# Patient Record
Sex: Female | Born: 1965 | Race: Black or African American | Hispanic: No | Marital: Single | Smoking: Never smoker
Health system: Southern US, Community
[De-identification: ages and names within clinical notes are randomized; demographics above are authoritative.]

## PROBLEM LIST (undated history)

## (undated) DIAGNOSIS — Z789 Other specified health status: Secondary | ICD-10-CM

## (undated) DIAGNOSIS — Z803 Family history of malignant neoplasm of breast: Secondary | ICD-10-CM

## (undated) DIAGNOSIS — D649 Anemia, unspecified: Secondary | ICD-10-CM

## (undated) HISTORY — DX: Family history of malignant neoplasm of breast: Z80.3

## (undated) HISTORY — PX: TUBAL LIGATION: SHX77

---

## 2015-06-23 ENCOUNTER — Inpatient Hospital Stay
Admission: EM | Admit: 2015-06-23 | Discharge: 2015-06-30 | DRG: 871 | Disposition: A | Discharge status: Home / Self Care | Payer: Self-pay | Attending: Internal Medicine | Admitting: Internal Medicine

## 2015-06-23 ENCOUNTER — Emergency Department: Payer: Self-pay

## 2015-06-23 ENCOUNTER — Emergency Department: Payer: MEDICAID

## 2015-06-23 DIAGNOSIS — K56600 Partial intestinal obstruction, unspecified as to cause: Secondary | ICD-10-CM | POA: Diagnosis present

## 2015-06-23 DIAGNOSIS — R059 Cough, unspecified: Secondary | ICD-10-CM

## 2015-06-23 DIAGNOSIS — E876 Hypokalemia: Secondary | ICD-10-CM | POA: Diagnosis present

## 2015-06-23 DIAGNOSIS — A419 Sepsis, unspecified organism: Secondary | ICD-10-CM | POA: Diagnosis present

## 2015-06-23 DIAGNOSIS — D72829 Elevated white blood cell count, unspecified: Secondary | ICD-10-CM | POA: Diagnosis present

## 2015-06-23 DIAGNOSIS — A09 Infectious gastroenteritis and colitis, unspecified: Secondary | ICD-10-CM

## 2015-06-23 DIAGNOSIS — J154 Pneumonia due to other streptococci: Secondary | ICD-10-CM | POA: Diagnosis present

## 2015-06-23 DIAGNOSIS — D509 Iron deficiency anemia, unspecified: Secondary | ICD-10-CM | POA: Diagnosis present

## 2015-06-23 DIAGNOSIS — J189 Pneumonia, unspecified organism: Secondary | ICD-10-CM | POA: Diagnosis present

## 2015-06-23 DIAGNOSIS — R05 Cough: Secondary | ICD-10-CM

## 2015-06-23 DIAGNOSIS — J869 Pyothorax without fistula: Secondary | ICD-10-CM

## 2015-06-23 DIAGNOSIS — J90 Pleural effusion, not elsewhere classified: Secondary | ICD-10-CM | POA: Diagnosis present

## 2015-06-23 DIAGNOSIS — A400 Sepsis due to streptococcus, group A: Principal | ICD-10-CM | POA: Diagnosis present

## 2015-06-23 DIAGNOSIS — K56609 Unspecified intestinal obstruction, unspecified as to partial versus complete obstruction: Secondary | ICD-10-CM

## 2015-06-23 HISTORY — DX: Other specified health status: Z78.9

## 2015-06-23 HISTORY — DX: Anemia, unspecified: D64.9

## 2015-06-23 LAB — COMPREHENSIVE METABOLIC PANEL
ALBUMIN: 2.6 g/dL — AB (ref 3.5–5.0)
ALK PHOS: 88 U/L (ref 38–126)
ALT: 10 U/L — AB (ref 14–54)
ANION GAP: 11 (ref 5–15)
AST: 21 U/L (ref 15–41)
BILIRUBIN TOTAL: 0.8 mg/dL (ref 0.3–1.2)
BUN: 17 mg/dL (ref 6–20)
CALCIUM: 8.4 mg/dL — AB (ref 8.9–10.3)
CO2: 28 mmol/L (ref 22–32)
Chloride: 100 mmol/L — ABNORMAL LOW (ref 101–111)
Creatinine, Ser: 0.65 mg/dL (ref 0.44–1.00)
GFR calc Af Amer: >60 mL/min (ref >60)
GFR calc non Af Amer: >60 mL/min (ref >60)
GLUCOSE: 106 mg/dL — AB (ref 65–99)
Potassium: 2.7 mmol/L — CL (ref 3.5–5.1)
SODIUM: 139 mmol/L (ref 135–145)
TOTAL PROTEIN: 7.7 g/dL (ref 6.5–8.1)

## 2015-06-23 LAB — CBC WITH DIFFERENTIAL/PLATELET
BASOS ABS: 0.2 10*3/uL — AB (ref 0–0.1)
BASOS PCT: 1 %
EOS PCT: 0 %
Eosinophils Absolute: 0 10*3/uL (ref 0–0.7)
HEMATOCRIT: 27.6 % — AB (ref 35.0–47.0)
Hemoglobin: 8.3 g/dL — ABNORMAL LOW (ref 12.0–16.0)
Lymphocytes Relative: 2 %
Lymphs Abs: 0.4 10*3/uL — ABNORMAL LOW (ref 1.0–3.6)
MCH: 19.9 pg — ABNORMAL LOW (ref 26.0–34.0)
MCHC: 30.1 g/dL — AB (ref 32.0–36.0)
MCV: 66.3 fL — AB (ref 80.0–100.0)
MONO ABS: 0.8 10*3/uL (ref 0.2–0.9)
MONOS PCT: 4 %
NEUTROS ABS: 22.2 10*3/uL — AB (ref 1.4–6.5)
Neutrophils Relative %: 93 %
PLATELETS: 434 10*3/uL (ref 150–440)
RBC: 4.16 MIL/uL (ref 3.80–5.20)
RDW: 16.4 % — AB (ref 11.5–14.5)
WBC: 23.7 10*3/uL — ABNORMAL HIGH (ref 3.6–11.0)

## 2015-06-23 LAB — URINALYSIS COMPLETE WITH MICROSCOPIC (ARMC ONLY)
Bacteria, UA: NONE SEEN
Bilirubin Urine: NEGATIVE
Glucose, UA: NEGATIVE mg/dL
Hgb urine dipstick: NEGATIVE
Leukocytes, UA: NEGATIVE
Nitrite: NEGATIVE
PH: 6 (ref 5.0–8.0)
PROTEIN: 100 mg/dL — AB
Specific Gravity, Urine: 1.021 (ref 1.005–1.030)

## 2015-06-23 LAB — LACTIC ACID, PLASMA
Lactic Acid, Venous: 1.1 mmol/L (ref 0.5–2.0)
Lactic Acid, Venous: 1 mmol/L (ref 0.5–2.0)

## 2015-06-23 LAB — POCT PREGNANCY, URINE: PREG TEST UR: NEGATIVE

## 2015-06-23 IMAGING — CR DG ABDOMEN ACUTE W/ 1V CHEST
1 series · 4 of 4 positions shown · non-contrast
Comparison: None.

CLINICAL DATA: 49-year-old female with nausea and vomiting, fever,
chills, body aches and generalized abdominal pain. Last bowel
movement 2 days ago.

EXAM:
DG ABDOMEN ACUTE W/ 1V CHEST

[Series 1: dg abd acute w/chest · 0.14mm/px · 4 of 4 slices shown]
[im 1/4]
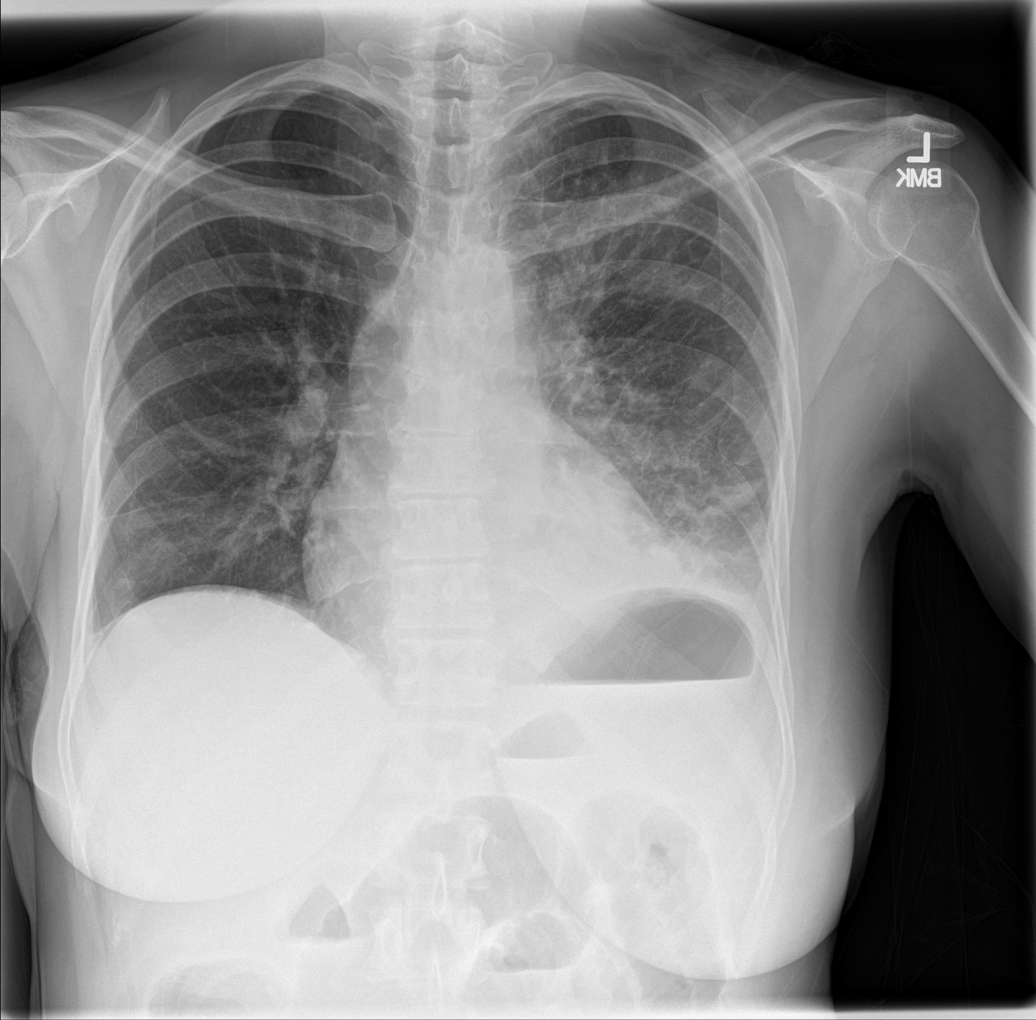
[im 2/4]
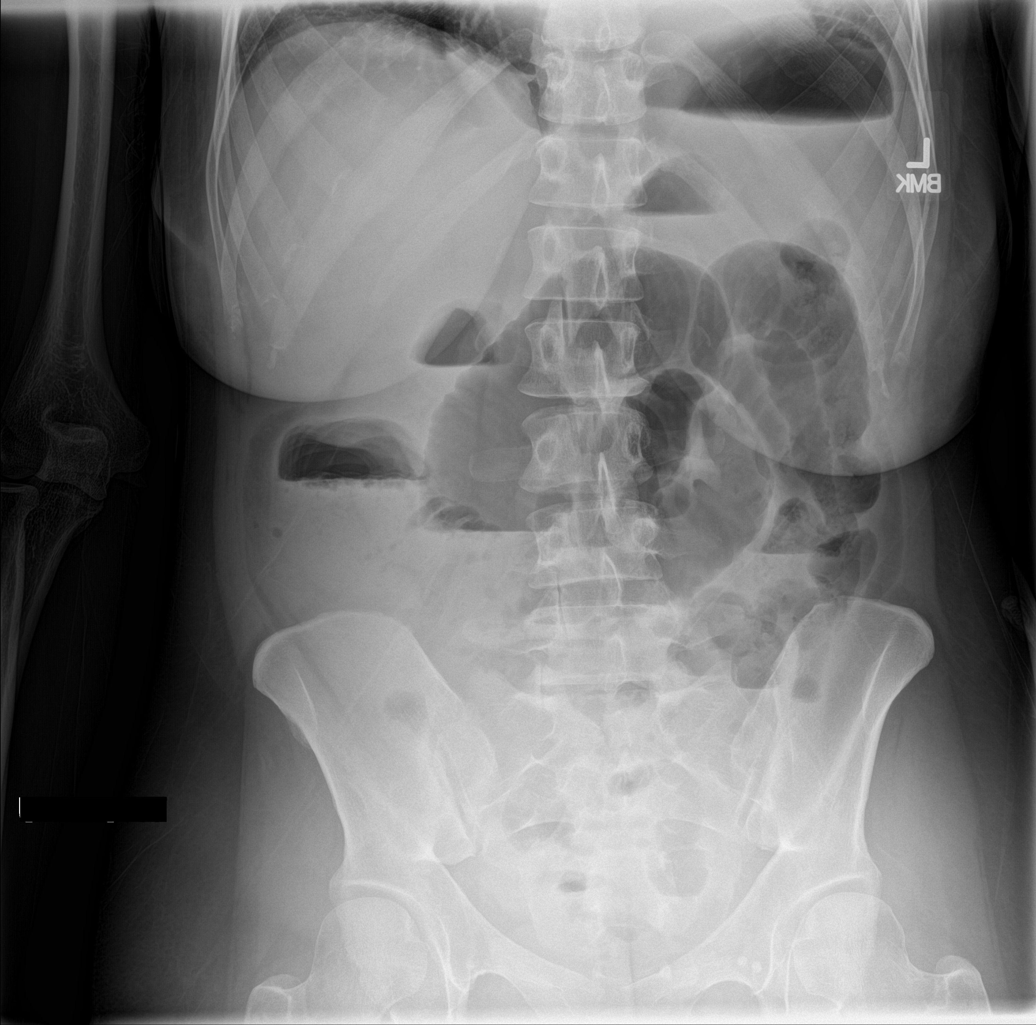
[im 3/4]
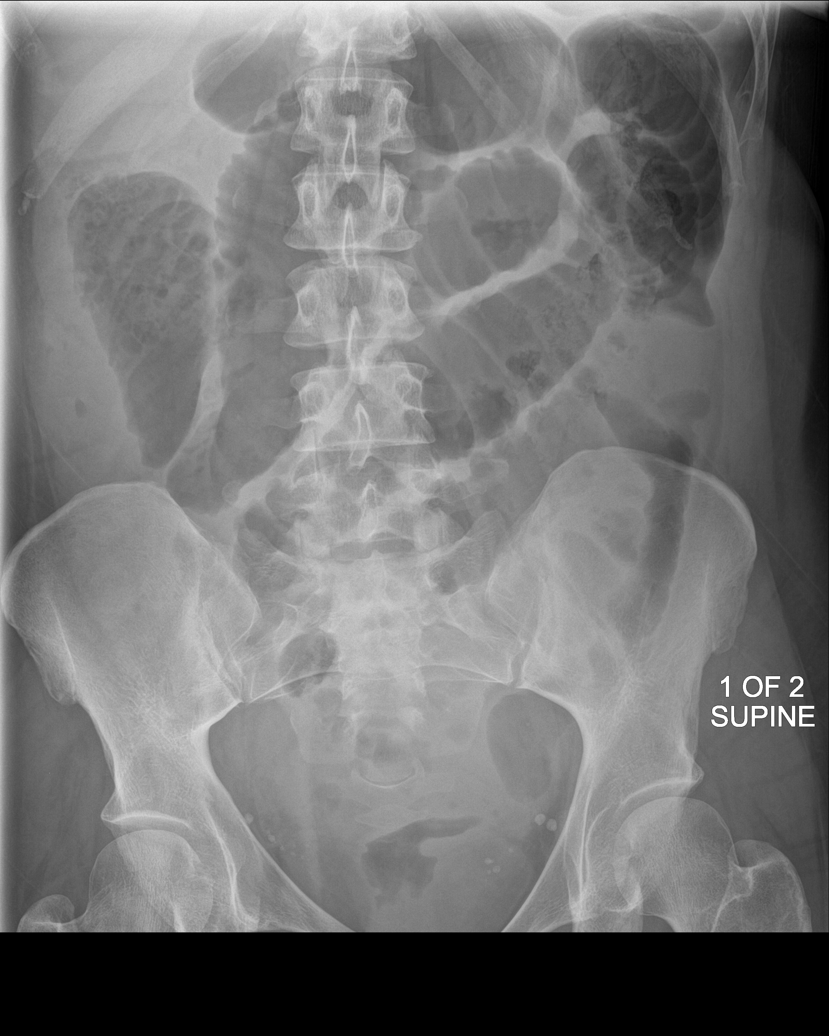
[im 4/4]
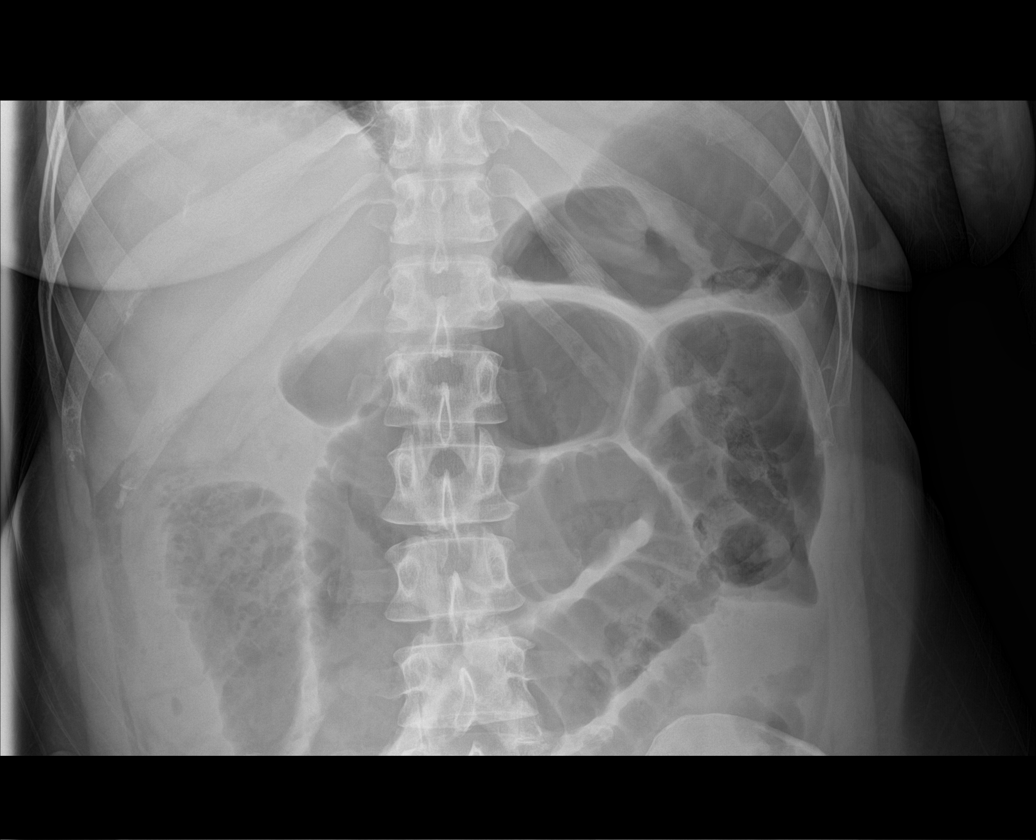

[4 of 4 positions shown; findings below may reference images not displayed]

FINDINGS: Multiple dilated loops of small bowel in the upper abdomen
demonstrating air- fluid levels on the erect image. Gas and liquid
stool in normal caliber colon from cecum to rectum. No evidence of
free intraperitoneal air on the erect image. Numerous pelvic
phleboliths. No visible opaque urinary tract calculi.

Cardiac silhouette upper normal in size. Hilar and mediastinal
contours unremarkable. Asymmetric interstitial opacities throughout
the left lung. Confluent airspace consolidation involving the left
lower lobe. Right lung clear. Small left pleural effusion.
IMPRESSION: 1. Partial small bowel obstruction. No evidence of free
intraperitoneal air.
2. Left lower lobe pneumonia. Asymmetric interstitial opacities
throughout the left lung. As the right lung is clear and the heart
size is upper normal, asymmetric pulmonary edema is not felt to be
the likely cause. Lymphangitic carcinomatosis or an interstitial
pneumonia can have this appearance. Follow-up chest x-ray is
recommended after treatment of the acute pneumonia.

## 2015-06-23 MED ORDER — DEXTROSE 5 % IV SOLN
1.0000 g | Freq: Once | INTRAVENOUS | Status: AC
  Administered 2015-06-23: 1 g via INTRAVENOUS
  Filled 2015-06-23: qty 10

## 2015-06-23 MED ORDER — IBUPROFEN 800 MG PO TABS
800.0000 mg | ORAL_TABLET | Freq: Once | ORAL | Status: AC
  Administered 2015-06-23: 800 mg via ORAL
  Filled 2015-06-23: qty 1

## 2015-06-23 MED ORDER — DEXTROSE 5 % IV SOLN
500.0000 mg | Freq: Once | INTRAVENOUS | Status: AC
  Administered 2015-06-23: 500 mg via INTRAVENOUS
  Filled 2015-06-23: qty 500

## 2015-06-23 MED ORDER — POTASSIUM CHLORIDE IN NACL 20-0.9 MEQ/L-% IV SOLN
Freq: Once | INTRAVENOUS | Status: AC
  Administered 2015-06-23: 21:00:00 via INTRAVENOUS
  Filled 2015-06-23: qty 1000

## 2015-06-23 MED ORDER — ONDANSETRON HCL 4 MG/2ML IJ SOLN
4.0000 mg | Freq: Four times a day (QID) | INTRAMUSCULAR | Status: DC | PRN
  Administered 2015-06-24 – 2015-06-25 (×3): 4 mg via INTRAVENOUS
  Filled 2015-06-23 (×3): qty 2

## 2015-06-23 MED ORDER — POTASSIUM CHLORIDE CRYS ER 20 MEQ PO TBCR
20.0000 meq | EXTENDED_RELEASE_TABLET | Freq: Once | ORAL | Status: DC
  Filled 2015-06-23: qty 1

## 2015-06-23 MED ORDER — POTASSIUM CHLORIDE 20 MEQ PO PACK
20.0000 meq | PACK | ORAL | Status: AC
  Administered 2015-06-23: 20 meq via ORAL
  Filled 2015-06-23: qty 1

## 2015-06-23 MED ORDER — ACETAMINOPHEN 650 MG RE SUPP: 650.0000 mg | Freq: Four times a day (QID) | RECTAL | Status: DC | PRN

## 2015-06-23 MED ORDER — SODIUM CHLORIDE 0.9 % IV SOLN
1.0000 g | Freq: Three times a day (TID) | INTRAVENOUS | Status: DC
  Administered 2015-06-24 (×2): 1 g via INTRAVENOUS
  Filled 2015-06-23 (×4): qty 1

## 2015-06-23 MED ORDER — SODIUM CHLORIDE 0.9 % IJ SOLN
3.0000 mL | Freq: Two times a day (BID) | INTRAMUSCULAR | Status: DC
  Administered 2015-06-24 – 2015-06-30 (×9): 3 mL via INTRAVENOUS

## 2015-06-23 MED ORDER — SODIUM CHLORIDE 0.9 % IV SOLN
INTRAVENOUS | Status: DC
  Administered 2015-06-23: via INTRAVENOUS

## 2015-06-23 MED ORDER — ONDANSETRON HCL 4 MG PO TABS: 4.0000 mg | ORAL_TABLET | Freq: Four times a day (QID) | ORAL | Status: DC | PRN

## 2015-06-23 MED ORDER — ACETAMINOPHEN 325 MG PO TABS
650.0000 mg | ORAL_TABLET | Freq: Four times a day (QID) | ORAL | Status: DC | PRN
  Filled 2015-06-23: qty 2

## 2015-06-23 MED ORDER — ENOXAPARIN SODIUM 40 MG/0.4ML ~~LOC~~ SOLN
40.0000 mg | SUBCUTANEOUS | Status: DC
  Administered 2015-06-24 – 2015-06-28 (×6): 40 mg via SUBCUTANEOUS
  Filled 2015-06-23 (×7): qty 0.4

## 2015-06-23 NOTE — Progress Notes (Signed)
ANTIBIOTIC CONSULT NOTE - INITIAL  Pharmacy Consult for meropenem Indication: rule out sepsis  No Known Allergies  Patient Measurements: Height: 5\' 9"  (175.3 cm) Weight: 177 lb 11.2 oz (80.604 kg) IBW/kg (Calculated) : 66.2 Adjusted Body Weight:   Vital Signs: Temp: 98.9 F (37.2 C) (11/10 2347) Temp Source: Oral (11/10 2347) BP: 118/70 mmHg (11/10 2347) Pulse Rate: 111 (11/10 2347) Intake/Output from previous day:   Intake/Output from this shift:    Labs:  Recent Labs  06/23/15 1822  WBC 23.7*  HGB 8.3*  PLT 434  CREATININE 0.65   Estimated Creatinine Clearance: 95.6 mL/min (by C-G formula based on Cr of 0.65). No results for input(s): VANCOTROUGH, VANCOPEAK, VANCORANDOM, GENTTROUGH, GENTPEAK, GENTRANDOM, TOBRATROUGH, TOBRAPEAK, TOBRARND, AMIKACINPEAK, AMIKACINTROU, AMIKACIN in the last 72 hours.   Microbiology: No results found for this or any previous visit (from the past 720 hour(s)).  Medical History: Past Medical History  Diagnosis Date  . Patient denies medical problems     Medications:  Infusions:  . sodium chloride     Assessment: 49 yof cc influenza, PNA on CXR in ED, elevated WBC, starting carbapenem for sepsis.   Goal of Therapy:    Plan:  Expected duration 7 days with resolution of temperature and/or normalization of WBC. Meropenem 1 gm IV Q8H.   Laural Benes, Pharm.D.  Clinical Pharmacist 06/23/2015,11:50 PM

## 2015-06-23 NOTE — ED Provider Notes (Addendum)
-----------------------------------------   8:49 PM on 06/23/2015 -----------------------------------------  Patient was initially seen by Wynema Birch. I have reviewed and added to his documentation. Please see the initial H&P for further detail.  The patient was moved from the flex care area to the acute care area due to her notable leukocytosis at 23,000 and her hypokalemia with a potassium of 2.7.  She reports she had diarrhea and abdominal discomfort on Monday. She did take some Imodium. She has not had diarrhea since Monday evening.  She did present to the emergency department this evening with a low-grade temperature of 100.18F and tachycardia with a heart rate in the 120s.  We are replacing her potassium with oral as well as IV potassium and will continue IV fluids to help with her heart rate.  Blood cultures obtained. I have advised the patient to attempt to have a bowel movement. If she provides a watery sample, we will send that for further testing as well as.  An abdominal x-ray series with chest x-ray shows dilated loops of bowel worrisome for partial small bowel obstruction.  The chest x-ray portion shows a left sided pneumonia.  I discussed the case with Dr. Jannifer Franklin who will see the patient for admission.  We will start the patient on ceftriaxone and azithromycin for her pneumonia.    Labs Reviewed  COMPREHENSIVE METABOLIC PANEL - Abnormal; Notable for the following:    Potassium 2.7 (*)    Chloride 100 (*)    Glucose, Bld 106 (*)    Calcium 8.4 (*)    Albumin 2.6 (*)    ALT 10 (*)    All other components within normal limits  CBC WITH DIFFERENTIAL/PLATELET - Abnormal; Notable for the following:    WBC 23.7 (*)    Hemoglobin 8.3 (*)    HCT 27.6 (*)    MCV 66.3 (*)    MCH 19.9 (*)    MCHC 30.1 (*)    RDW 16.4 (*)    Neutro Abs 22.2 (*)    Lymphs Abs 0.4 (*)    Basophils Absolute 0.2 (*)    All other components within normal limits  URINALYSIS COMPLETEWITH  MICROSCOPIC ([facility] ONLY) - Abnormal; Notable for the following:    Color, Urine YELLOW (*)    APPearance CLEAR (*)    Ketones, ur 1+ (*)    Protein, ur 100 (*)    Squamous Epithelial / LPF 0-5 (*)    All other components within normal limits  CULTURE, BLOOD (ROUTINE X 2)  CULTURE, BLOOD (ROUTINE X 2)  LACTIC ACID, PLASMA  LACTIC ACID, PLASMA  POC URINE PREG, ED  POCT PREGNANCY, URINE      Ahmed Prima, MD 06/23/15 2051  Ahmed Prima, MD 06/23/15 2104

## 2015-06-23 NOTE — H&P (Addendum)
[place] [facility] [place] NAME: Jessica Willis    MR#:  [id number]  DATE OF BIRTH:  [birth year]  DATE OF ADMISSION:  06/23/2015  PRIMARY CARE PHYSICIAN: No PCP Per Patient   REQUESTING/REFERRING PHYSICIAN: Thomasene Lot, MD  CHIEF COMPLAINT:   Chief Complaint  Patient presents with  . Influenza    HISTORY OF PRESENT ILLNESS:  Jessica Willis  is a 49 y.o. female who presents with fever, chills, malaise.  Patient states that she has had some GI upset with initially diarrhea 3-4 days ago which stopped about 2 days ago. She's had malaise and fever with chills. In general she just has felt weak and with significant lack of energy. She has had some left-sided pleuritic pain, which she attributed to the fact that she was sleeping on her left side a lot. She denies any cough, nausea, vomiting. Denies any baseline medical problems. Does not take any medications daily. In the ED she was found to have what is likely a pneumonia on chest x-ray, with an elevated white blood cell count. Also an abdominal x-ray shows found to have a likely partial small bowel obstruction. Hospitalists were called for admission for the same  PAST MEDICAL HISTORY:   Past Medical History  Diagnosis Date  . Patient denies medical problems     PAST SURGICAL HISTORY:   Past Surgical History  Procedure Laterality Date  . Tubal ligation      SOCIAL HISTORY:   Social History  Substance Use Topics  . Smoking status: Never Smoker   . Smokeless tobacco: Not on file  . Alcohol Use: 0.0 oz/week    0 Standard drinks or equivalent per week     Comment: occassional    FAMILY HISTORY:   Family History  Problem Relation Age of Onset  . Diabetes    . Hypertension      DRUG ALLERGIES:  No Known Allergies  MEDICATIONS AT HOME:   Prior to Admission medications   Not on File    REVIEW OF SYSTEMS:  Review of Systems  Constitutional: Positive for fever,  chills and malaise/fatigue. Negative for weight loss.  HENT: Negative for ear pain, hearing loss and tinnitus.   Eyes: Negative for blurred vision, double vision, pain and redness.  Respiratory: Negative for cough, hemoptysis and shortness of breath.   Cardiovascular: Positive for chest pain (left-sided, pleuritic). Negative for palpitations, orthopnea and leg swelling.  Gastrointestinal: Positive for diarrhea. Negative for nausea, vomiting, abdominal pain and constipation.  Genitourinary: Negative for dysuria, frequency and hematuria.  Musculoskeletal: Negative for back pain, joint pain and neck pain.  Skin:       No acne, rash, or lesions  Neurological: Negative for dizziness, tremors, focal weakness and weakness.  Endo/Heme/Allergies: Negative for polydipsia. Does not bruise/bleed easily.  Psychiatric/Behavioral: Negative for depression. The patient is not nervous/anxious and does not have insomnia.      VITAL SIGNS:   Filed Vitals:   06/23/15 1712 06/23/15 1821 06/23/15 2040  BP: 155/86 146/79 128/81  Pulse: 124 123 127  Temp: 100.4 F (38 C) 100.1 F (37.8 C)   Resp: 16 16   Height: 5\' 9"  (1.753 m)    SpO2: 94% 94% 94%   Wt Readings from Last 3 Encounters:  No data found for Wt    PHYSICAL EXAMINATION:  Physical Exam  Vitals reviewed. Constitutional: She is oriented to person, place, and time. She appears well-developed and well-nourished. No distress (she does appear  uncomfortable).  HENT:  Head: Normocephalic and atraumatic.  Mouth/Throat: Oropharynx is clear and moist.  Eyes: Conjunctivae and EOM are normal. Pupils are equal, round, and reactive to light. No scleral icterus.  Neck: Normal range of motion. Neck supple. No JVD present. No thyromegaly present.  Cardiovascular: Regular rhythm and intact distal pulses.  Exam reveals no gallop and no friction rub.   No murmur heard. Tachycardic  Respiratory: Effort normal. No respiratory distress. She has no wheezes. She  has rales (left lower lobe).  GI: Soft. Bowel sounds are normal. She exhibits no distension. There is no tenderness.  Musculoskeletal: Normal range of motion. She exhibits no edema.  No arthritis, no gout  Lymphadenopathy:    She has no cervical adenopathy.  Neurological: She is alert and oriented to person, place, and time. No cranial nerve deficit.  No dysarthria, no aphasia  Skin: Skin is warm and dry. No rash noted. No erythema.  Psychiatric: She has a normal mood and affect. Her behavior is normal. Judgment and thought content normal.    LABORATORY PANEL:   CBC  Recent Labs Lab 06/23/15 1822  WBC 23.7*  HGB 8.3*  HCT 27.6*  PLT 434   ------------------------------------------------------------------------------------------------------------------  Chemistries   Recent Labs Lab 06/23/15 1822  NA 139  K 2.7*  CL 100*  CO2 28  GLUCOSE 106*  BUN 17  CREATININE 0.65  CALCIUM 8.4*  AST 21  ALT 10*  ALKPHOS 88  BILITOT 0.8   ------------------------------------------------------------------------------------------------------------------  Cardiac Enzymes No results for input(s): TROPONINI in the last 168 hours. ------------------------------------------------------------------------------------------------------------------  RADIOLOGY:  Dg Abd Acute W/chest  06/23/2015  CLINICAL DATA:  49 year old female with nausea and vomiting, fever, chills, body aches and generalized abdominal pain. Last bowel movement 2 days ago. EXAM: DG ABDOMEN ACUTE W/ 1V CHEST COMPARISON:  None. FINDINGS: Multiple dilated loops of small bowel in the upper abdomen demonstrating air- fluid levels on the erect image. Gas and liquid stool in normal caliber colon from cecum to rectum. No evidence of free intraperitoneal air on the erect image. Numerous pelvic phleboliths. No visible opaque urinary tract calculi. Cardiac silhouette upper normal in size. Hilar and mediastinal contours  unremarkable. Asymmetric interstitial opacities throughout the left lung. Confluent airspace consolidation involving the left lower lobe. Right lung clear. Small left pleural effusion. IMPRESSION: 1. Partial small bowel obstruction. No evidence of free intraperitoneal air. 2. Left lower lobe pneumonia. Asymmetric interstitial opacities throughout the left lung. As the right lung is clear and the heart size is upper normal, asymmetric pulmonary edema is not felt to be the likely cause. Lymphangitic carcinomatosis or an interstitial pneumonia can have this appearance. Follow-up chest x-ray is recommended after treatment of the acute pneumonia. Electronically Signed   By: Evangeline Dakin M.D.   On: 06/23/2015 20:39    EKG:  No orders found for this or any previous visit.  IMPRESSION AND PLAN:  Principal Problem:   Sepsis ([place]) - tachycardia, tachypnea, leukocytosis. IV antibiotics started in the ED, cultures sent. Lactic acid normal. Hemodynamically stable. Sepsis likely due to community-acquired pneumonia, see below for treatment. IV fluids given. Active Problems:   CAP (community acquired pneumonia) - left lower lobe pneumonia seen on chest x-ray. Rocephin and azithromycin given in the ED. Given her abdominal complaints we will switch this to meropenem on admission.   Partial small bowel obstruction ([place]) - patient denies current complaint of abdominal issues other than some intermittent abdominal discomfort. States that her diarrhea has stopped at this  time. Abdominal x-ray shows significantly dilated loops of small bowel concerning for partial small bowel obstruction. Will keep on clear liquids only at this time, and monitor for improvement.   Hypokalemia - replace and monitor  All the records are reviewed and case discussed with ED provider. Management plans discussed with the patient and/or family.  DVT PROPHYLAXIS: SubQ lovenox  ADMISSION STATUS: Inpatient  CODE STATUS: full  TOTAL TIME  TAKING CARE OF THIS PATIENT: 45 minutes.    Livie Vanderhoof FIELDING 06/23/2015, 10:08 PM  [facility]  Office  [phone]  CC: Primary care physician; No PCP Per Patient

## 2015-06-23 NOTE — ED Notes (Signed)
Pt C/O flu-like symptoms, No N/V. Reports cold chills, dry mouth, abd pain.

## 2015-06-23 NOTE — ED Provider Notes (Addendum)
[facility] Emergency Department Provider Note  ____________________________________________  Time seen: Approximately 5:59 PM  I have reviewed the triage vital signs and the nursing notes.   HISTORY  Chief Complaint Influenza  "stomach flu", diarrhea   HPI Jessica Willis is a 49 y.o. female who presents to evaluation of "flulike symptoms". Earlier in week complaint of nausea and vomiting, however that resolved with the use of Imodium. Now complains of fever, chills, body aches, lethargy, dry mouth and abdominal pain. Reports last bowel movement was 2 days ago.   History reviewed. No pertinent past medical history.  There are no active problems to display for this patient.   Past Surgical History  Procedure Laterality Date  . Tubal ligation      No current outpatient prescriptions on file.  Allergies Review of patient's allergies indicates no known allergies.  No family history on file.  Social History Social History  Substance Use Topics  . Smoking status: Never Smoker   . Smokeless tobacco: None  . Alcohol Use: Yes    Review of Systems Constitutional: Positive fever and chills. Eyes: No visual changes. ENT: No sore throat. Complains of having a very dry mouth. Cardiovascular: Denies chest pain. Respiratory: Denies shortness of breath. Gastrointestinal: Positive abdominal pain all quadrants.  No nausea, no vomiting.  No diarrhea.  No constipation. Last BM 2 days ago. Genitourinary: Negative for dysuria. Musculoskeletal: Negative for back pain. Skin: Negative for rash. Neurological: Negative for headaches, positive for weakness, denies any numbness.  10-point ROS otherwise negative.  ____________________________________________   PHYSICAL EXAM:  VITAL SIGNS: ED Triage Vitals  Enc Vitals Group     BP 06/23/15 1712 155/86 mmHg     Pulse Rate 06/23/15 1712 124     Resp 06/23/15 1712 16     Temp 06/23/15 1712 100.4 F (38  C)     Temp src --      SpO2 06/23/15 1712 94 %     Weight --      Height 06/23/15 1712 5\' 9"  (1.753 m)     Head Cir --      Peak Flow --      Pain Score --      Pain Loc --      Pain Edu? --      Excl. in [place]? --     Constitutional: Alert and oriented. Well appearing and in no acute distress. Eyes: Conjunctivae are normal. PERRL. EOMI. Head: Atraumatic. Nose: No congestion/rhinnorhea. Mouth/Throat: Mucous membranes are moist.  Oropharynx non-erythematous. Neck: No stridor.   Cardiovascular: Normal rate, regular rhythm. Grossly normal heart sounds.  Good peripheral circulation. Respiratory: Normal respiratory effort.  No retractions. Lungs CTAB. Gastrointestinal: Soft, positive distention. Positive tympanic sounds noted in all 4 quadrants. Musculoskeletal: No lower extremity tenderness nor edema.  No joint effusions. Neurologic:  Normal speech and language. No gross focal neurologic deficits are appreciated. No gait instability. Skin:  Skin is warm, dry and intact. No rash noted. Psychiatric: Mood and affect are normal. Speech and behavior are normal.  ____________________________________________   LABS (all labs ordered are listed, but only abnormal results are displayed)  Labs Reviewed  COMPREHENSIVE METABOLIC PANEL - Abnormal; Notable for the following:    Potassium 2.7 (*)    Chloride 100 (*)    Glucose, Bld 106 (*)    Calcium 8.4 (*)    Albumin 2.6 (*)    ALT 10 (*)    All other components within normal limits  CBC  WITH DIFFERENTIAL/PLATELET - Abnormal; Notable for the following:    WBC 23.7 (*)    Hemoglobin 8.3 (*)    HCT 27.6 (*)    MCV 66.3 (*)    MCH 19.9 (*)    MCHC 30.1 (*)    RDW 16.4 (*)    Neutro Abs 22.2 (*)    Lymphs Abs 0.4 (*)    Basophils Absolute 0.2 (*)    All other components within normal limits  URINALYSIS COMPLETEWITH MICROSCOPIC ([facility] ONLY) - Abnormal; Notable for the following:    Color, Urine YELLOW (*)    APPearance CLEAR (*)     Ketones, ur 1+ (*)    Protein, ur 100 (*)    Squamous Epithelial / LPF 0-5 (*)    All other components within normal limits  CULTURE, BLOOD (ROUTINE X 2)  CULTURE, BLOOD (ROUTINE X 2)  LACTIC ACID, PLASMA  LACTIC ACID, PLASMA  POC URINE PREG, ED  POCT PREGNANCY, URINE   ____________________________________________  RADIOLOGY  Not available to the time of this dictation. ____________________________________________   PROCEDURES  Procedure(s) performed: None  Critical Care performed: No  ____________________________________________   INITIAL IMPRESSION / ASSESSMENT AND PLAN / ED COURSE  Pertinent labs & imaging results that were available during my care of the patient were reviewed by me and considered in my medical decision making (see chart for details).  Acute hypokalemia with elevated WBC. Saline lock started lactic acid and blood cultures 2 performed. Patient to be transferred over to the main ER, report given to Dr. Thomasene Lot. ____________________________________________   FINAL CLINICAL IMPRESSION(S) / ED DIAGNOSES  Final diagnoses:  Hypokalemia due to loss of potassium  Leukocytosis  Diarrhea of infectious origin  Community acquired pneumonia      Arlyss Repress, PA-C 06/23/15 1903  Ahmed Prima, MD 06/23/15 2049  Ahmed Prima, MD 06/23/15 2105

## 2015-06-24 ENCOUNTER — Inpatient Hospital Stay: Payer: Self-pay

## 2015-06-24 ENCOUNTER — Encounter: Payer: Self-pay | Admitting: Emergency Medicine

## 2015-06-24 DIAGNOSIS — E876 Hypokalemia: Secondary | ICD-10-CM | POA: Diagnosis present

## 2015-06-24 LAB — BASIC METABOLIC PANEL
Anion gap: 9 (ref 5–15)
BUN: 16 mg/dL (ref 6–20)
CALCIUM: 7.8 mg/dL — AB (ref 8.9–10.3)
CHLORIDE: 100 mmol/L — AB (ref 101–111)
CO2: 30 mmol/L (ref 22–32)
CREATININE: 0.55 mg/dL (ref 0.44–1.00)
GFR calc non Af Amer: >60 mL/min (ref >60)
GLUCOSE: 118 mg/dL — AB (ref 65–99)
Potassium: 2.5 mmol/L — CL (ref 3.5–5.1)
Sodium: 139 mmol/L (ref 135–145)

## 2015-06-24 LAB — IRON AND TIBC
Iron: <5 ug/dL — ABNORMAL LOW (ref 28–170)
TIBC: 225 ug/dL — ABNORMAL LOW (ref 250–450)

## 2015-06-24 LAB — MAGNESIUM
MAGNESIUM: 2.5 mg/dL — AB (ref 1.7–2.4)
Magnesium: 2.4 mg/dL (ref 1.7–2.4)

## 2015-06-24 LAB — CBC
HCT: 24 % — ABNORMAL LOW (ref 35.0–47.0)
Hemoglobin: 7.5 g/dL — ABNORMAL LOW (ref 12.0–16.0)
MCH: 20.7 pg — AB (ref 26.0–34.0)
MCHC: 31.2 g/dL — AB (ref 32.0–36.0)
MCV: 66.3 fL — AB (ref 80.0–100.0)
PLATELETS: 353 10*3/uL (ref 150–440)
RBC: 3.63 MIL/uL — AB (ref 3.80–5.20)
RDW: 16.2 % — AB (ref 11.5–14.5)
WBC: 15.5 10*3/uL — ABNORMAL HIGH (ref 3.6–11.0)

## 2015-06-24 LAB — FERRITIN: Ferritin: 188 ng/mL (ref 11–307)

## 2015-06-24 LAB — POTASSIUM: POTASSIUM: 3.1 mmol/L — AB (ref 3.5–5.1)

## 2015-06-24 IMAGING — CR DG ABDOMEN 2V
2 series · 2 of 2 positions shown · non-contrast
Comparison: [DATE]

CLINICAL DATA: Followup bowel obstruction. Lower abdominal pain,
nausea.

EXAM:
ABDOMEN - 2 VIEW

[abdomen erect]
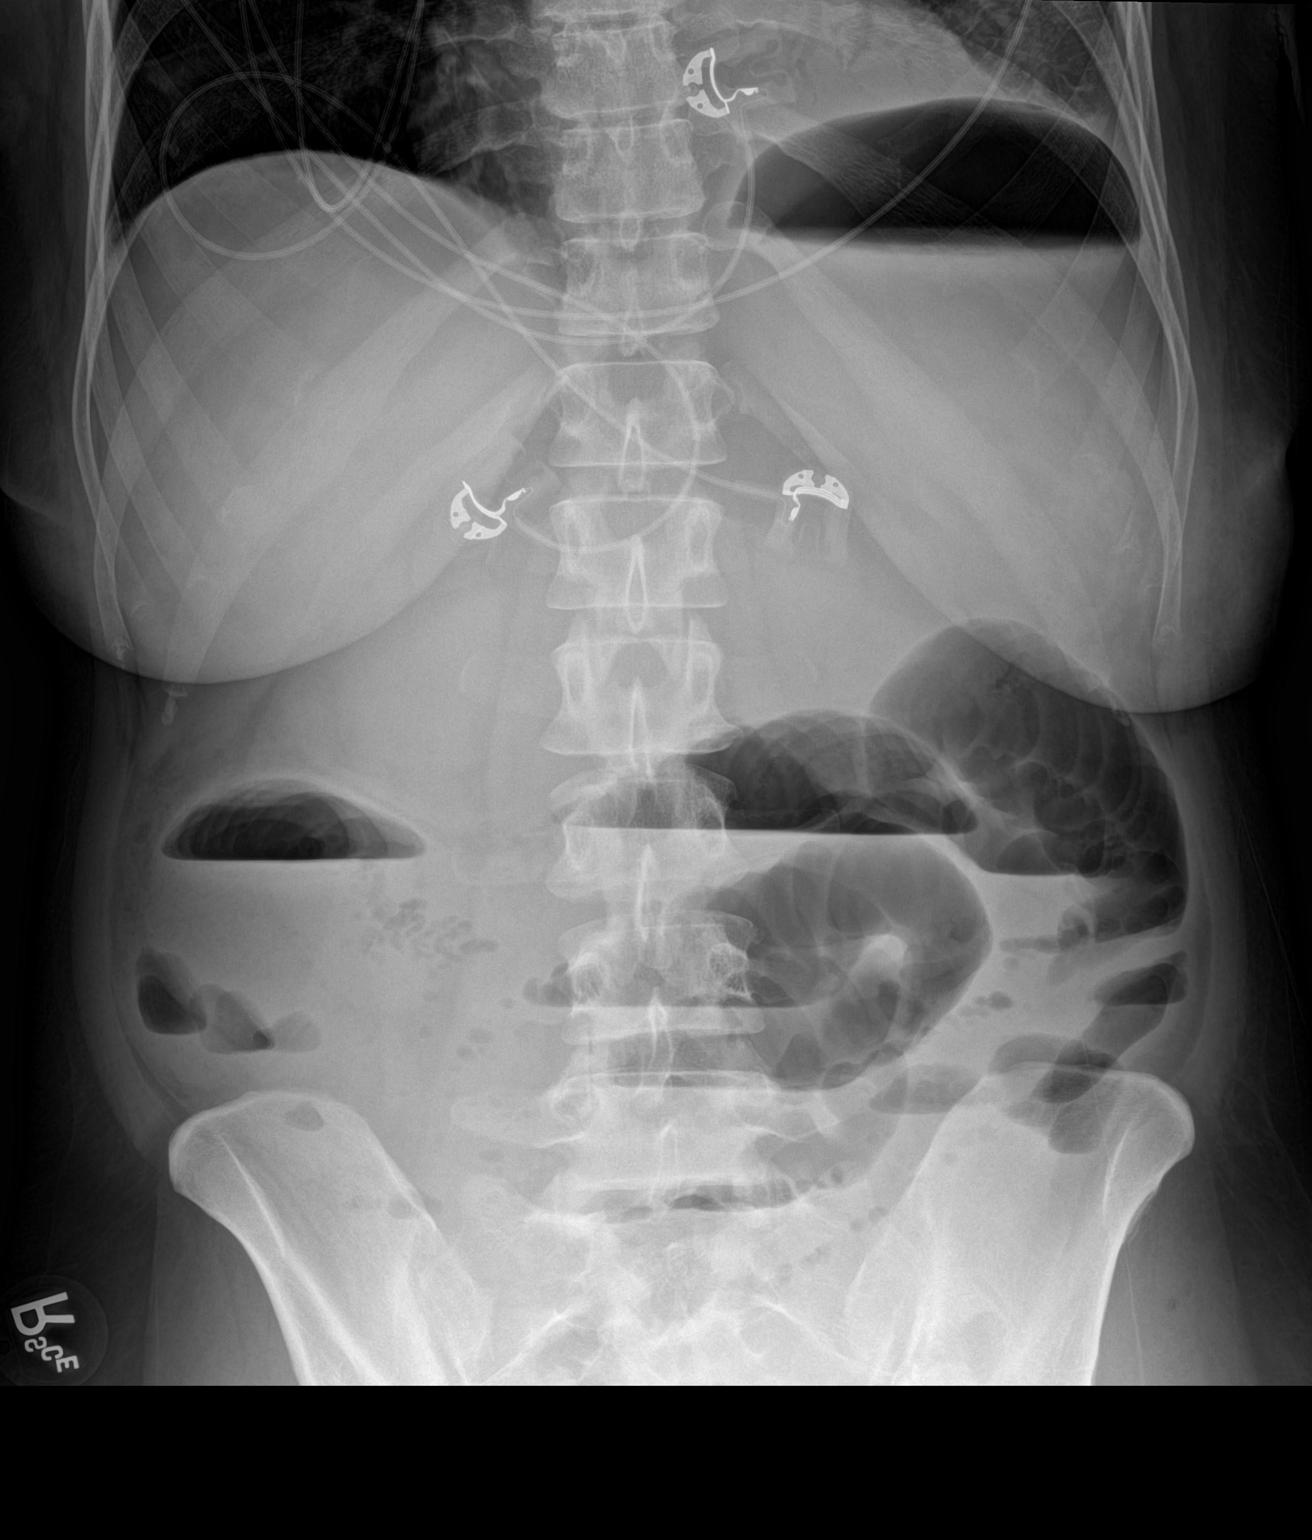

[abdomen supine]
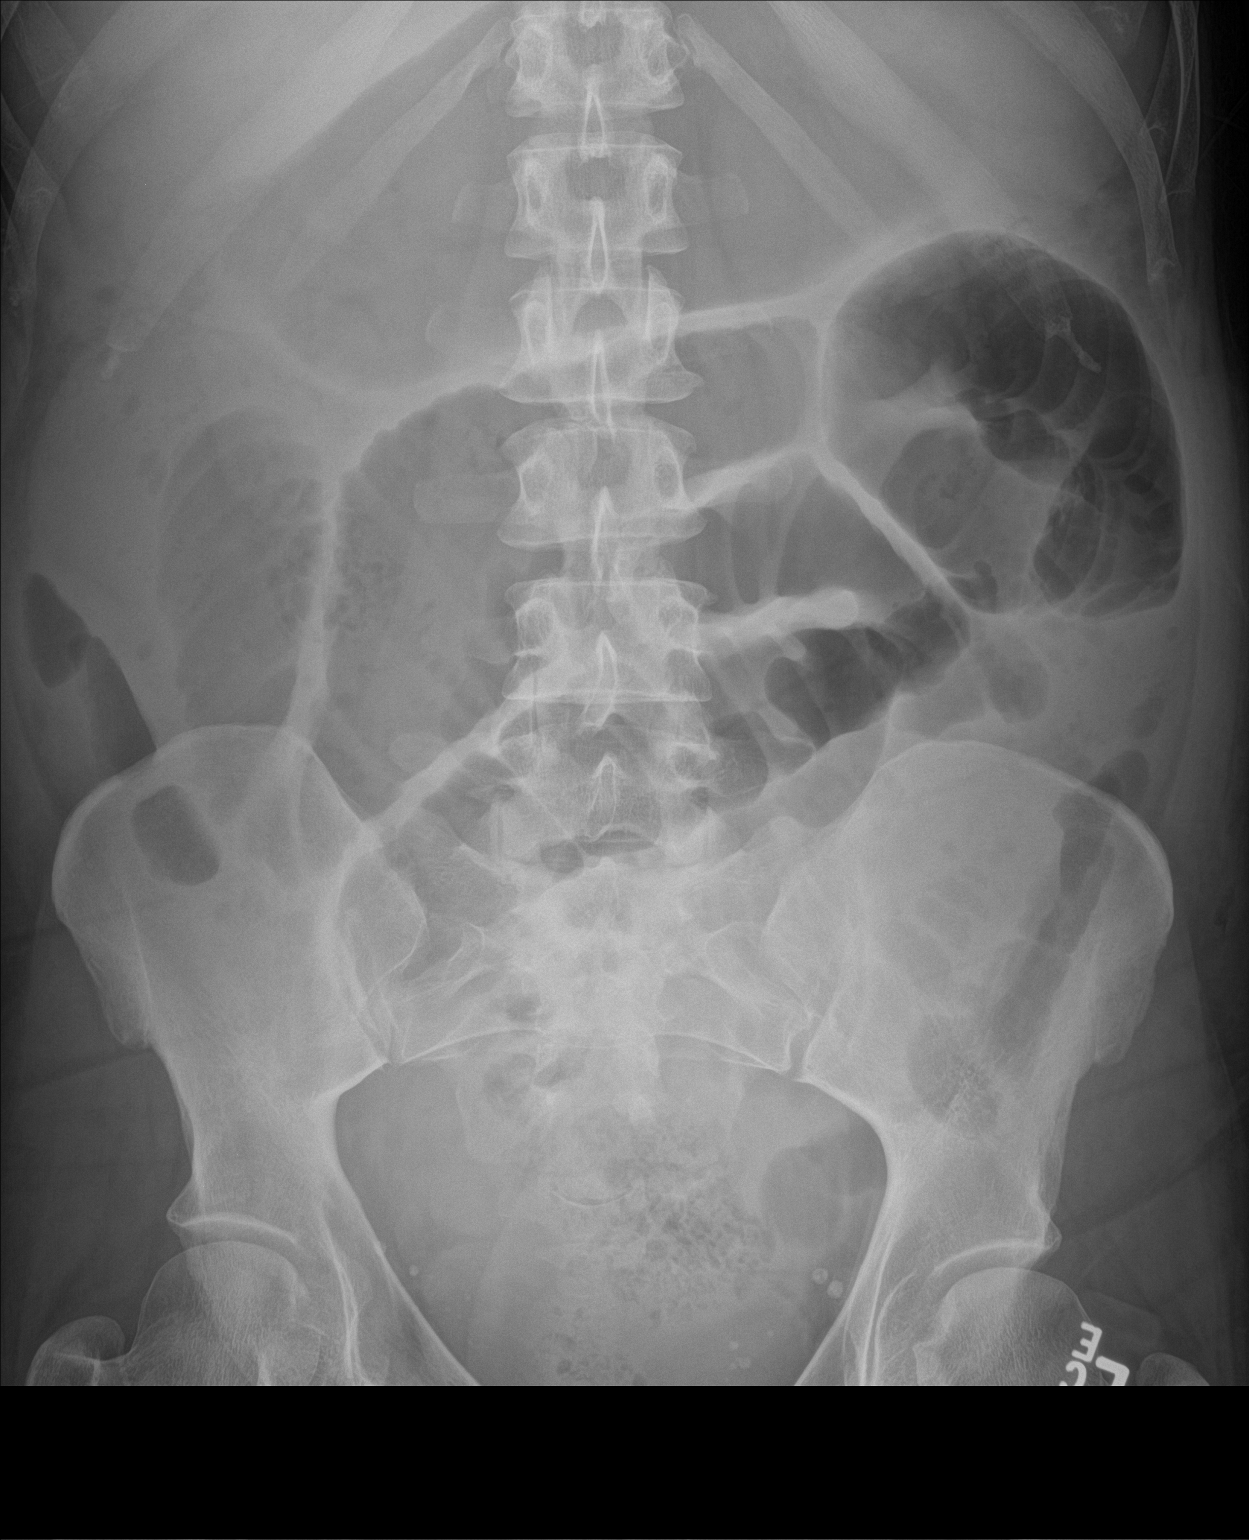

[2 of 2 positions shown; findings below may reference images not displayed]

FINDINGS: Dilated small bowel loops with air-fluid levels are again noted,
similar prior study, compatible with small bowel obstruction. Mild
gaseous distention of the stomach with air-fluid level. No free air
organomegaly.
IMPRESSION: Continued small bowel obstruction pattern without significant
change.

## 2015-06-24 MED ORDER — POTASSIUM CHLORIDE 10 MEQ/100ML IV SOLN
10.0000 meq | INTRAVENOUS | Status: AC
  Administered 2015-06-24 (×2): 10 meq via INTRAVENOUS
  Filled 2015-06-24 (×2): qty 100

## 2015-06-24 MED ORDER — POTASSIUM CHLORIDE CRYS ER 20 MEQ PO TBCR
40.0000 meq | EXTENDED_RELEASE_TABLET | Freq: Once | ORAL | Status: AC
  Administered 2015-06-24: 40 meq via ORAL
  Filled 2015-06-24: qty 2

## 2015-06-24 MED ORDER — DEXTROSE 5 % IV SOLN
1.0000 g | INTRAVENOUS | Status: DC
  Administered 2015-06-24 – 2015-06-26 (×3): 1 g via INTRAVENOUS
  Filled 2015-06-24 (×4): qty 10

## 2015-06-24 MED ORDER — AZITHROMYCIN 500 MG IV SOLR
500.0000 mg | INTRAVENOUS | Status: DC
  Administered 2015-06-24 – 2015-06-25 (×2): 500 mg via INTRAVENOUS
  Filled 2015-06-24 (×3): qty 500

## 2015-06-24 MED ORDER — POTASSIUM CHLORIDE IN NACL 20-0.9 MEQ/L-% IV SOLN
INTRAVENOUS | Status: DC
  Administered 2015-06-24 – 2015-06-25 (×2): via INTRAVENOUS
  Filled 2015-06-24 (×4): qty 1000

## 2015-06-24 MED ORDER — PANTOPRAZOLE SODIUM 40 MG IV SOLR
40.0000 mg | Freq: Two times a day (BID) | INTRAVENOUS | Status: DC
  Administered 2015-06-24 – 2015-06-28 (×9): 40 mg via INTRAVENOUS
  Filled 2015-06-24 (×9): qty 40

## 2015-06-24 NOTE — Progress Notes (Signed)
Initial Nutrition Assessment   INTERVENTION:   Coordination of Care: await diet progression as medically able Medical Food Supplement Therapy: will recommend Boost Breeze po TID, each supplement provides 250 kcal and 9 grams of protein Coordination of Care: will continue to assess for malnutrition using guidelines from AND/ASPEN    NUTRITION DIAGNOSIS:   Inadequate oral intake related to acute illness as evidenced by per patient/family report.  GOAL:   Patient will meet greater than or equal to 90% of their needs  MONITOR:    (Energy Intake, Electrolyte and renal Profile, Digestive system, Anthropometrics)  REASON FOR ASSESSMENT:   Malnutrition Screening Tool    ASSESSMENT:   Pt admitted with pna with sepsis, possible aspiration pna secondary to vomitting. Per MD note, pt with n/v for one day prior to Monday of this week where upon pt has not had flatus or BM since.   Past Medical History  Diagnosis Date  . Patient denies medical problems   . Anemia     Diet Order:  Diet NPO time specified    Current Nutrition: Pt now NPO, was on CL diet order this am. Pt reports having some broth unable to clarify this morning or last night.   Food/Nutrition-Related History: Pt reports appetite has been down for the past week. Unable to clarify further as pt nauseated and not wanting to elaborate. RD notes pt with n/v last weekend prior to Monday.    Scheduled Medications:  . azithromycin (ZITHROMAX) 500 MG IVPB  500 mg Intravenous Q24H  . cefTRIAXone (ROCEPHIN)  IV  1 g Intravenous Q24H  . enoxaparin (LOVENOX) injection  40 mg Subcutaneous Q24H  . sodium chloride  3 mL Intravenous Q12H    Continuous Medications:  . 0.9 % NaCl with KCl 20 mEq / L 75 mL/hr at 06/24/15 1032     Electrolyte/Renal Profile and Glucose Profile:   Recent Labs Lab 06/23/15 1822 06/24/15 0411  NA 139 139  K 2.7* 2.5*  CL 100* 100*  CO2 28 30  BUN 17 16  CREATININE 0.65 0.55  CALCIUM 8.4*  7.8*  MG 2.4  --   GLUCOSE 106* 118*   Protein Profile:  Recent Labs Lab 06/23/15 1822  ALBUMIN 2.6*    Gastrointestinal Profile: +nausea Last BM:  06/20/2015   Nutrition-Focused Physical Exam Findings:  Unable to complete Nutrition-Focused physical exam at this time.    Weight Change: Pt reports weight of 186lbs UBW up until Sunday. 5% weight loss in one week.    Skin:  Reviewed, no issues  Height:   Ht Readings from Last 1 Encounters:  06/23/15 5\' 9"  (1.753 m)    Weight:   Wt Readings from Last 1 Encounters:  06/23/15 177 lb 11.2 oz (80.604 kg)     BMI:  Body mass index is 26.23 kg/(m^2).  Estimated Nutritional Needs:   Kcal:  BEE: 1490kcals, TEE: (IF 1.1-1.3)(AF 1.2) [id number]  Protein:  65-80g protein (0.8-1.0g/kg)  Fluid:  2015-246mL of fluid (25-36mL/kg)    HIGH Care Level  Dwyane Luo, RD, LDN Pager [phone]

## 2015-06-24 NOTE — Progress Notes (Signed)
[facility] NAME: Bhavani Chiaramonte    MR#:  [id number]  DATE OF BIRTH:  24-Oct-1965  SUBJECTIVE:  CHIEF COMPLAINT:   Chief Complaint  Patient presents with  . Influenza   Admitted for abdominal pain and nausea. Found to have left lower lobe pneumonia along with sepsis. Today her main concern is nausea. Last bowel movement was Monday. Had vomiting and diarrhea prior to this for one day. No significant shortness of breath. Mild cough. Afebrile. REVIEW OF SYSTEMS:    Review of Systems  Constitutional: Negative for fever and chills.  HENT: Negative for sore throat.   Eyes: Negative for blurred vision, double vision and pain.  Respiratory: Negative for cough, hemoptysis, shortness of breath and wheezing.   Cardiovascular: Negative for chest pain, palpitations, orthopnea and leg swelling.  Gastrointestinal: Negative for heartburn, nausea, vomiting, abdominal pain, diarrhea and constipation.  Genitourinary: Negative for dysuria and hematuria.  Musculoskeletal: Negative for back pain and joint pain.  Skin: Negative for rash.  Neurological: Negative for sensory change, speech change, focal weakness and headaches.  Endo/Heme/Allergies: Does not bruise/bleed easily.  Psychiatric/Behavioral: Negative for depression. The patient is not nervous/anxious.    DRUG ALLERGIES:  No Known Allergies  VITALS:  Blood pressure 130/81, pulse 112, temperature 98 F (36.7 C), temperature source Oral, resp. rate 17, height 5\' 9"  (1.753 m), weight 80.604 kg (177 lb 11.2 oz), SpO2 94 %.  PHYSICAL EXAMINATION:   Physical Exam  GENERAL:  49 y.o.-year-old patient lying in the bed with no acute distress.  EYES: Pupils equal, round, reactive to light and accommodation. No scleral icterus. Extraocular muscles intact.  HEENT: Head atraumatic, normocephalic. Oropharynx and nasopharynx clear.  NECK:  Supple, no jugular venous distention. No thyroid  enlargement, no tenderness.  LUNGS: Normal breath sounds bilaterally, no wheezing, rales, rhonchi. No use of accessory muscles of respiration. His breath sounds left lower lobe CARDIOVASCULAR: S1, S2 normal. No murmurs, rubs, or gallops.  ABDOMEN: Soft, nondistended. Bowel sounds present. No organomegaly or mass. Diffuse tenderness EXTREMITIES: No cyanosis, clubbing or edema b/l.    NEUROLOGIC: Cranial nerves II through XII are intact. No focal Motor or sensory deficits b/l.   PSYCHIATRIC: The patient is alert and oriented x 3.  SKIN: No obvious rash, lesion, or ulcer.   LABORATORY PANEL:   CBC  Recent Labs Lab 06/24/15 0411  WBC 15.5*  HGB 7.5*  HCT 24.0*  PLT 353   ------------------------------------------------------------------------------------------------------------------  Chemistries   Recent Labs Lab 06/23/15 1822 06/24/15 0411  NA 139 139  K 2.7* 2.5*  CL 100* 100*  CO2 28 30  GLUCOSE 106* 118*  BUN 17 16  CREATININE 0.65 0.55  CALCIUM 8.4* 7.8*  MG 2.4  --   AST 21  --   ALT 10*  --   ALKPHOS 88  --   BILITOT 0.8  --    ------------------------------------------------------------------------------------------------------------------  Cardiac Enzymes No results for input(s): TROPONINI in the last 168 hours. ------------------------------------------------------------------------------------------------------------------  RADIOLOGY:  Dg Abd Acute W/chest  06/23/2015  CLINICAL DATA:  49 year old female with nausea and vomiting, fever, chills, body aches and generalized abdominal pain. Last bowel movement 2 days ago. EXAM: DG ABDOMEN ACUTE W/ 1V CHEST COMPARISON:  None. FINDINGS: Multiple dilated loops of small bowel in the upper abdomen demonstrating air- fluid levels on the erect image. Gas and liquid stool in normal caliber colon from cecum to rectum. No evidence of free intraperitoneal air on the erect  image. Numerous pelvic phleboliths. No visible  opaque urinary tract calculi. Cardiac silhouette upper normal in size. Hilar and mediastinal contours unremarkable. Asymmetric interstitial opacities throughout the left lung. Confluent airspace consolidation involving the left lower lobe. Right lung clear. Small left pleural effusion. IMPRESSION: 1. Partial small bowel obstruction. No evidence of free intraperitoneal air. 2. Left lower lobe pneumonia. Asymmetric interstitial opacities throughout the left lung. As the right lung is clear and the heart size is upper normal, asymmetric pulmonary edema is not felt to be the likely cause. Lymphangitic carcinomatosis or an interstitial pneumonia can have this appearance. Follow-up chest x-ray is recommended after treatment of the acute pneumonia. Electronically Signed   By: Evangeline Dakin M.D.   On: 06/23/2015 20:39     ASSESSMENT AND PLAN:   * Left pneumonia, community acquired with sepsis Possible aspiration pneumonia due to recent vomiting On IV abx. Stop meropenem. IVF. Sputum cultures.  * Partial small bowel obstruction No history of surgeries. Could be ileus. Due to hypokalemia. Will repeat abdominal x-ray. Consult surgery. Passing gas. Last bowel movement was 5 days back. Had diarrhea prior to this.  * Hypokalemia due to decreased PO intake Replace thru IV and PO Repeat in AM  * Microcytic anemia Check iron studies. Repeat in the morning. Likely chronic. No melena or rectal bleed.   All the records are reviewed and case discussed with Care Management/Social Workerr. Management plans discussed with the patient, family and they are in agreement.  CODE STATUS: FULL  DVT Prophylaxis: SCDs  TOTAL TIME TAKING CARE OF THIS PATIENT: 40 minutes.   POSSIBLE D/C IN 2-3 DAYS, DEPENDING ON CLINICAL CONDITION.   Hillary Bow R M.D on 06/24/2015 at 11:40 AM  Between 7am to 6pm - Pager - [phone]  After 6pm go to [email or web address] - password [email or web address] [facility]  [facility] [place]   Office  [phone]  CC: Primary care physician; No PCP Per Patient    Note: This dictation was prepared with Dragon dictation along with smaller phrase technology. Any transcriptional errors that result from this process are unintentional.

## 2015-06-24 NOTE — Progress Notes (Signed)
Notified MD of patient critical lab K+ 2.5, see order

## 2015-06-24 NOTE — Progress Notes (Signed)
Patient A/O, no noted distress. Denies pain. Patient notes she have had a bm, since 06/20/15. Patient has been a little tachy 110 -114. No skin issues. Patient has purse at bedside and would like to keep it. Tolerated meds we. Tolerated meds well. Stafff will continue to meet needs and monitor.

## 2015-06-25 ENCOUNTER — Inpatient Hospital Stay: Payer: Self-pay

## 2015-06-25 ENCOUNTER — Encounter: Payer: Self-pay | Admitting: Radiology

## 2015-06-25 DIAGNOSIS — K56609 Unspecified intestinal obstruction, unspecified as to partial versus complete obstruction: Secondary | ICD-10-CM | POA: Diagnosis present

## 2015-06-25 DIAGNOSIS — D72829 Elevated white blood cell count, unspecified: Secondary | ICD-10-CM

## 2015-06-25 DIAGNOSIS — K565 Intestinal adhesions [bands] with obstruction (postprocedural) (postinfection): Secondary | ICD-10-CM

## 2015-06-25 LAB — BASIC METABOLIC PANEL
Anion gap: 9 (ref 5–15)
BUN: 15 mg/dL (ref 6–20)
CALCIUM: 7.9 mg/dL — AB (ref 8.9–10.3)
CO2: 29 mmol/L (ref 22–32)
CREATININE: 0.53 mg/dL (ref 0.44–1.00)
Chloride: 103 mmol/L (ref 101–111)
GFR calc Af Amer: >60 mL/min (ref >60)
GLUCOSE: 103 mg/dL — AB (ref 65–99)
POTASSIUM: 2.7 mmol/L — AB (ref 3.5–5.1)
Sodium: 141 mmol/L (ref 135–145)

## 2015-06-25 LAB — CBC WITH DIFFERENTIAL/PLATELET
BASOS PCT: 0 %
Basophils Absolute: 0 10*3/uL (ref 0–0.1)
EOS ABS: 0 10*3/uL (ref 0–0.7)
EOS PCT: 0 %
HCT: 26 % — ABNORMAL LOW (ref 35.0–47.0)
Hemoglobin: 7.8 g/dL — ABNORMAL LOW (ref 12.0–16.0)
LYMPHS ABS: 0.7 10*3/uL — AB (ref 1.0–3.6)
Lymphocytes Relative: 4 %
MCH: 20 pg — AB (ref 26.0–34.0)
MCHC: 30 g/dL — AB (ref 32.0–36.0)
MCV: 66.5 fL — AB (ref 80.0–100.0)
MONO ABS: 0.9 10*3/uL (ref 0.2–0.9)
Monocytes Relative: 5 %
NEUTROS ABS: 15.9 10*3/uL — AB (ref 1.4–6.5)
Neutrophils Relative %: 91 %
PLATELETS: 453 10*3/uL — AB (ref 150–440)
RBC: 3.91 MIL/uL (ref 3.80–5.20)
RDW: 16.3 % — AB (ref 11.5–14.5)
WBC: 17.5 10*3/uL — ABNORMAL HIGH (ref 3.6–11.0)

## 2015-06-25 IMAGING — CR DG ABDOMEN 2V
3 series · 3 of 3 positions shown · non-contrast
Comparison: [DATE]

CLINICAL DATA: F/u intestinal obstruction

EXAM:
ABDOMEN - 2 VIEW

[abdomen erect]
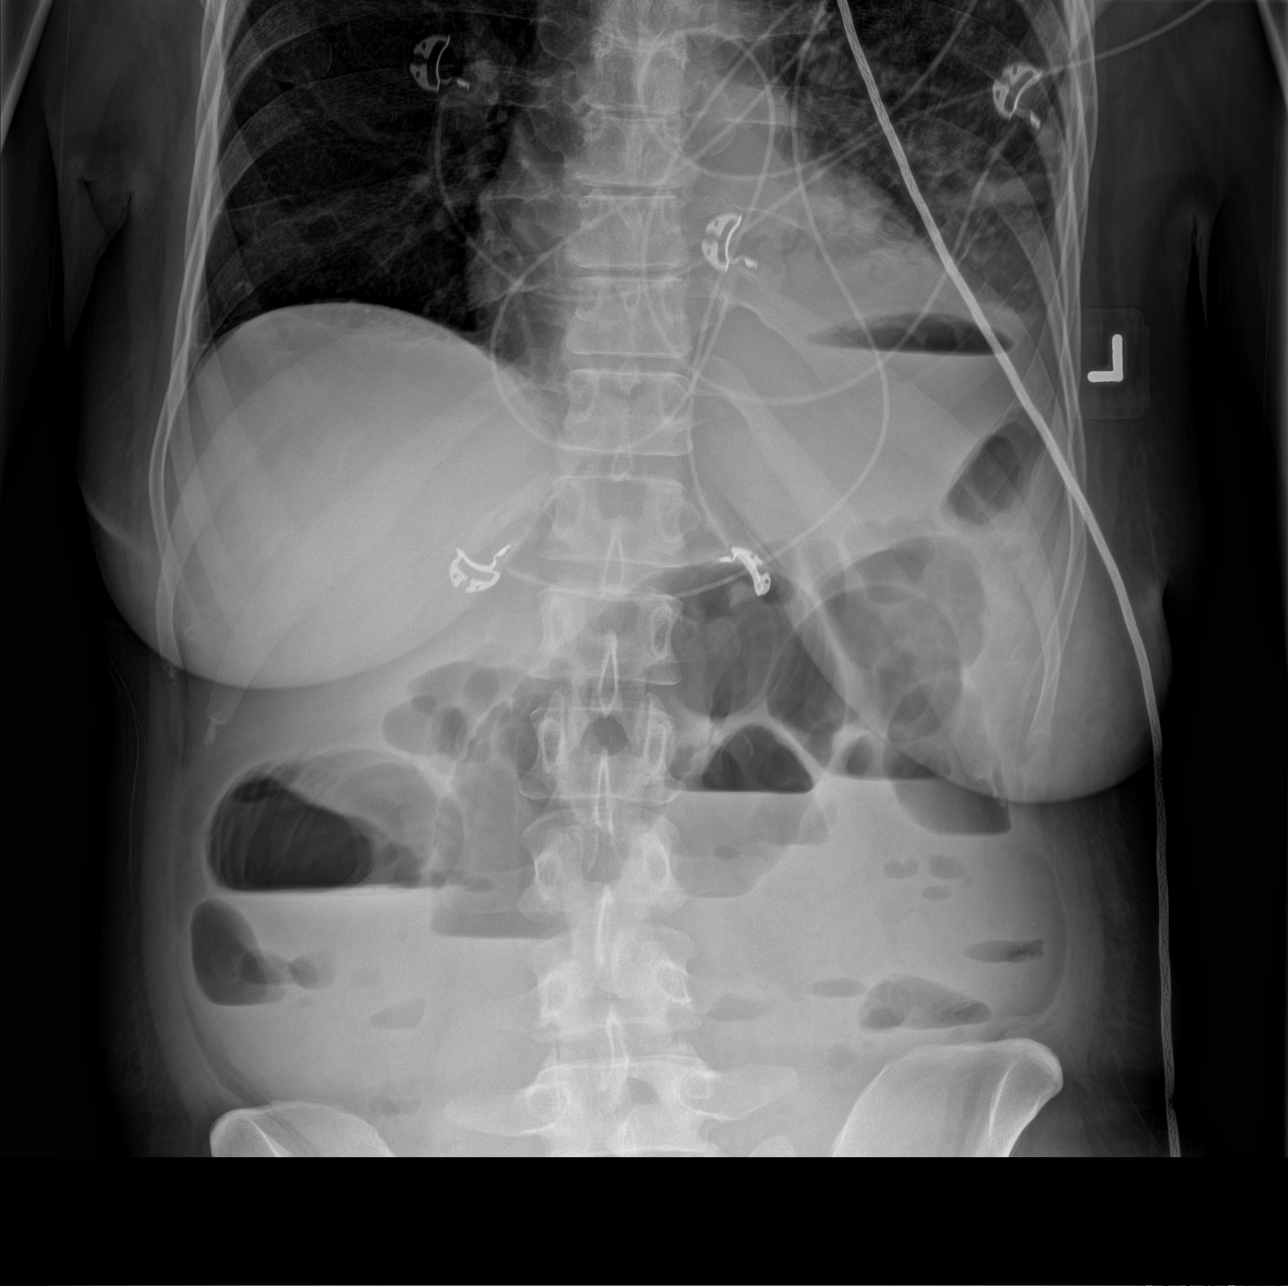

[abdomen supine (1 of 2)]
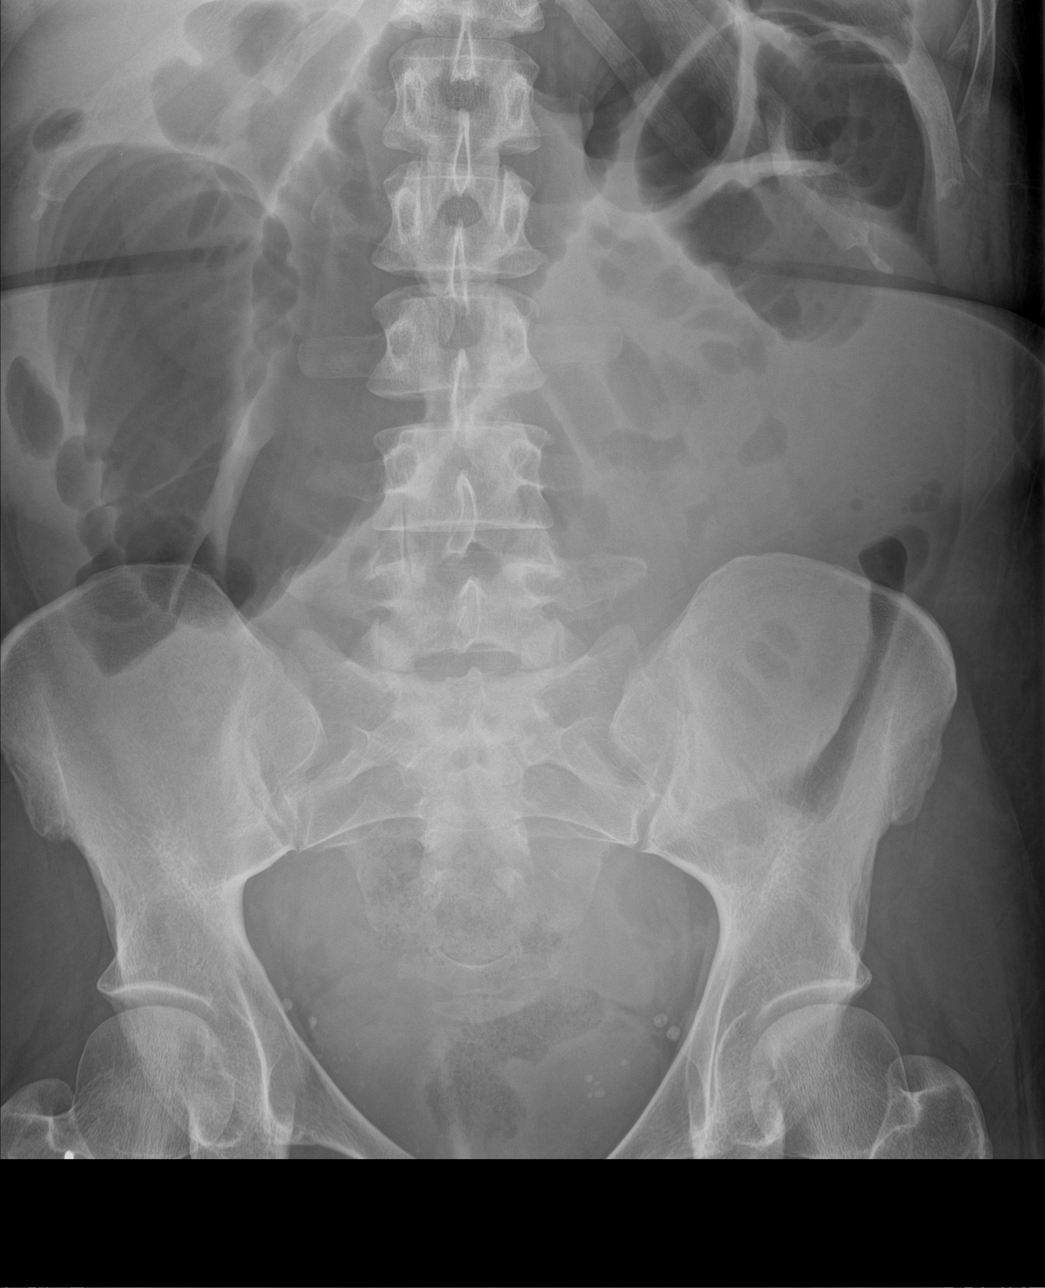

[abdomen supine (2 of 2)]
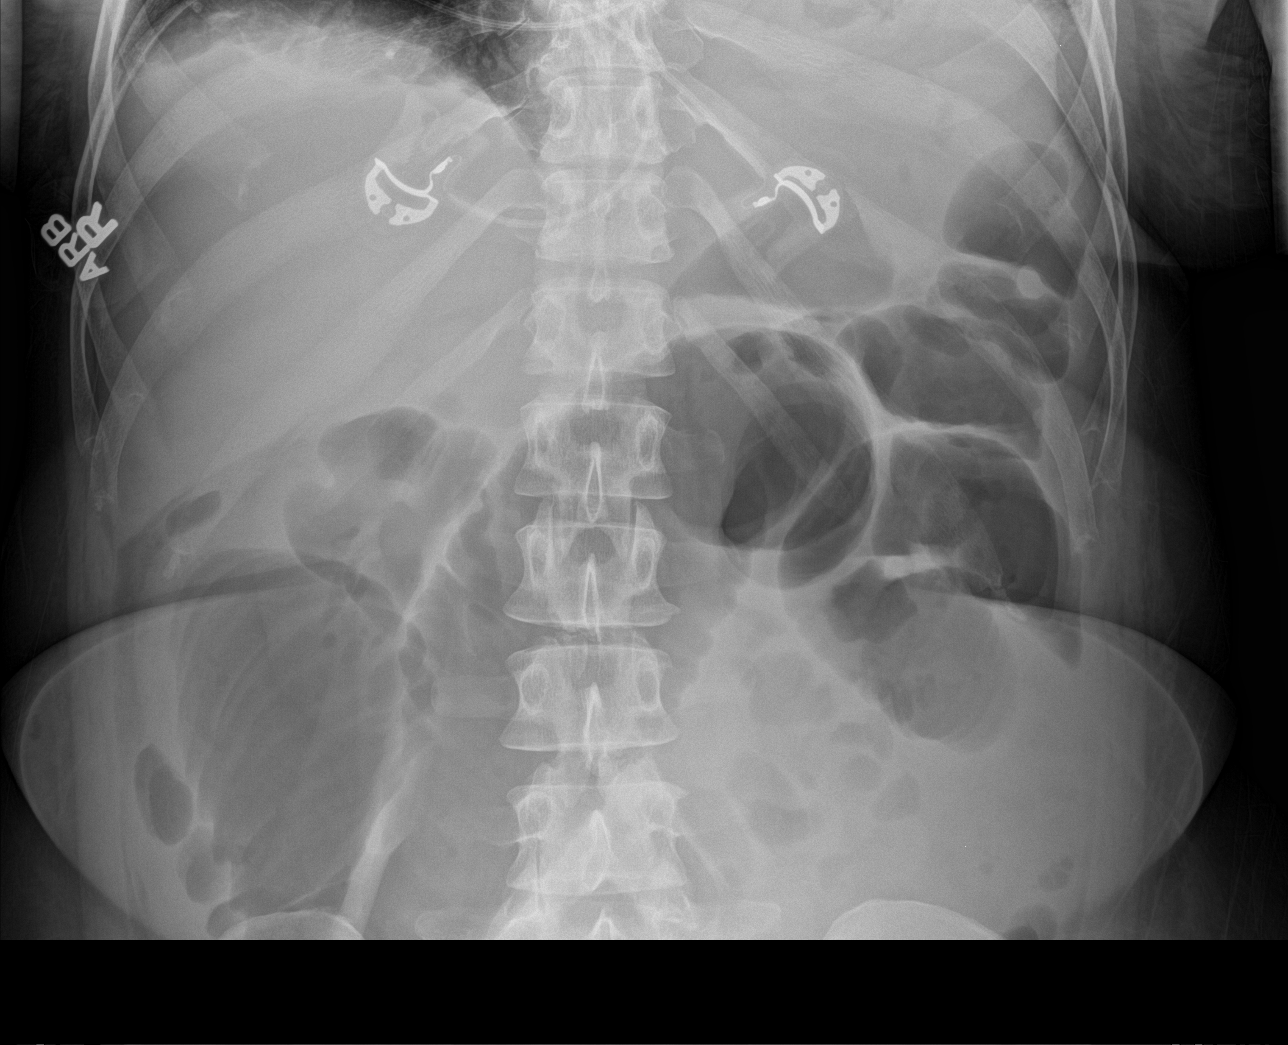

[3 of 3 positions shown; findings below may reference images not displayed]

FINDINGS: Supine and erect views of the abdomen show significant dilatation of
numerous small bowel loops which is increased since previous exam.
No definite evidence for free intraperitoneal air. Small amount of
colonic gas is identified, within normal caliber loops. Scattered
phleboliths within the pelvis.

Note is made of left lower lobe infiltrate, associated with pleural
effusion.
IMPRESSION: 1. Increased small bowel dilatation.
2. Left lower lobe infiltrate.

## 2015-06-25 IMAGING — CT CT ABD-PELV W/ CM
1 of 3 series · 13 of 32 positions shown, 18 images · IV contrast (omnipaque)
Comparison: Recent plain film examinations from previous data

CLINICAL DATA: Abdominal pain for several weeks, elevated white
blood cell count

EXAM:
CT ABDOMEN AND PELVIS WITH CONTRAST
TECHNIQUE: Multidetector CT imaging of the abdomen and pelvis was performed
using the standard protocol following bolus administration of
intravenous contrast.
CONTRAST:  100mL OMNIPAQUE IOHEXOL 300 MG/ML  SOLN

[Series 2: routine abd pel with · axial · 0.68mm/px · z∈[-467,-17]mm · 13 of 101 slices shown, 18 images]
[im 6/101  soft-tissue]
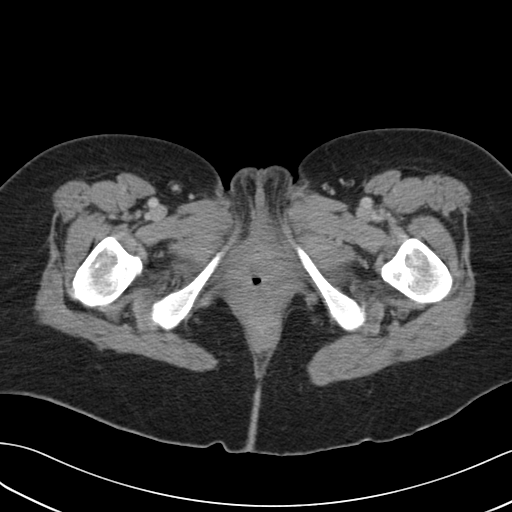
[im 6/101  bone]
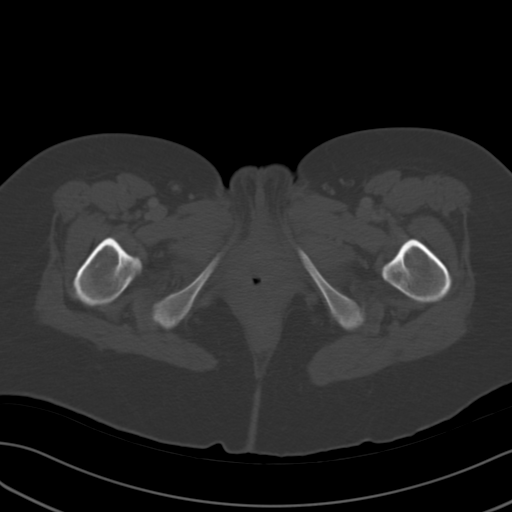
[im 16/101  soft-tissue]
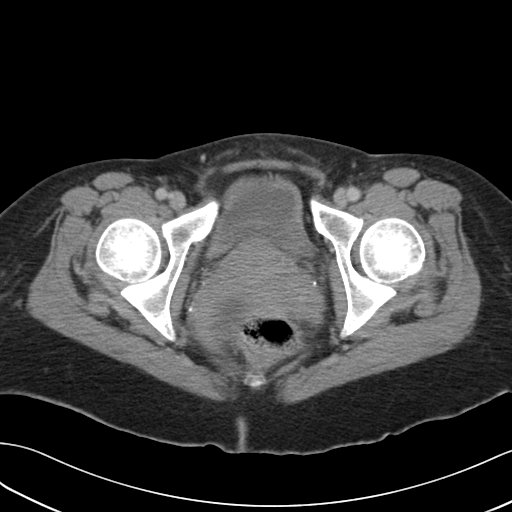
[im 21/101  soft-tissue]
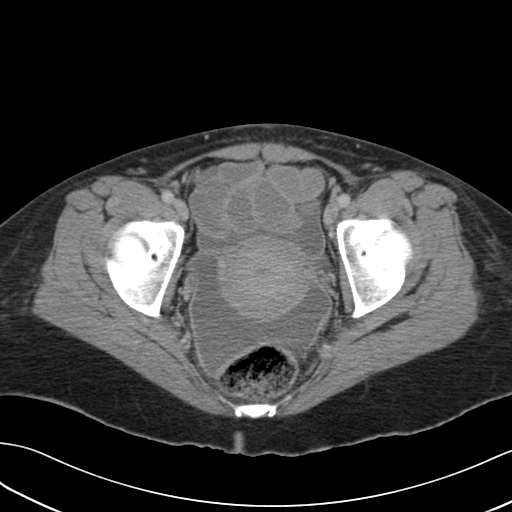
[im 31/101  soft-tissue]
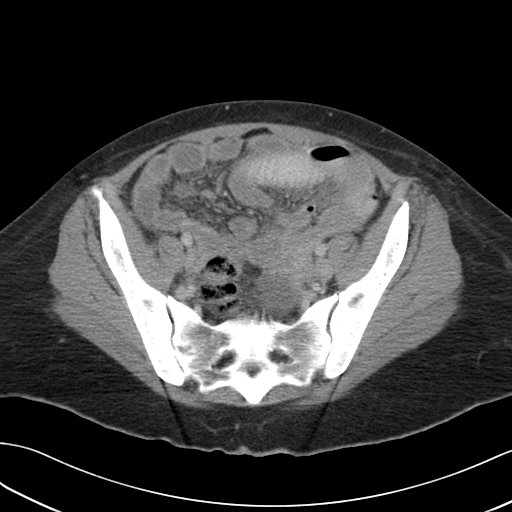
[im 41/101  soft-tissue]
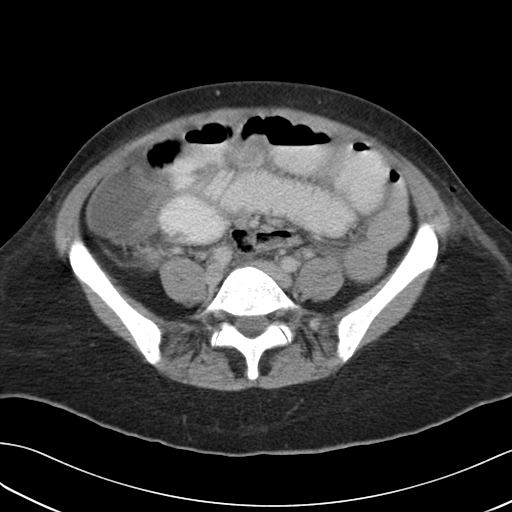
[im 46/101  soft-tissue]
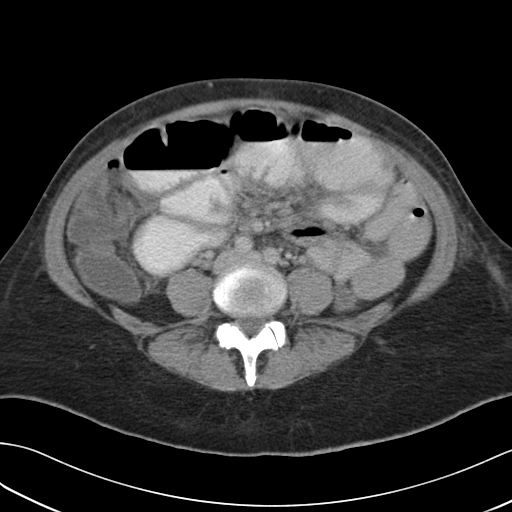
[im 56/101  soft-tissue]
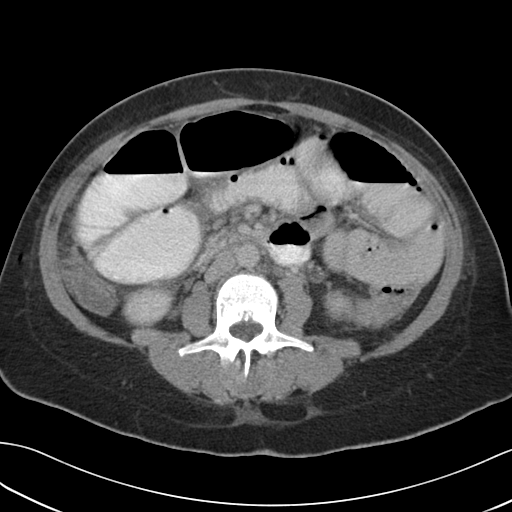
[im 61/101  soft-tissue]
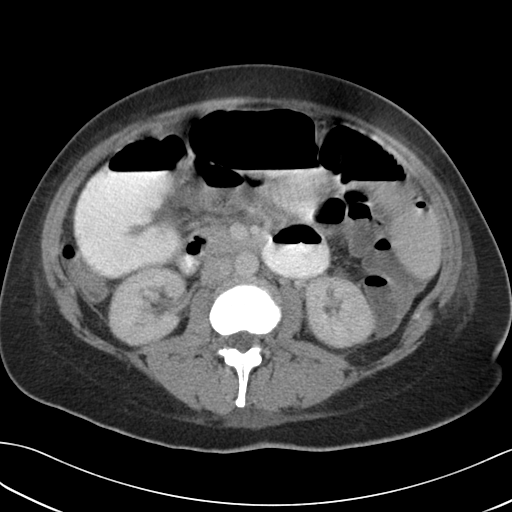
[im 71/101  soft-tissue]
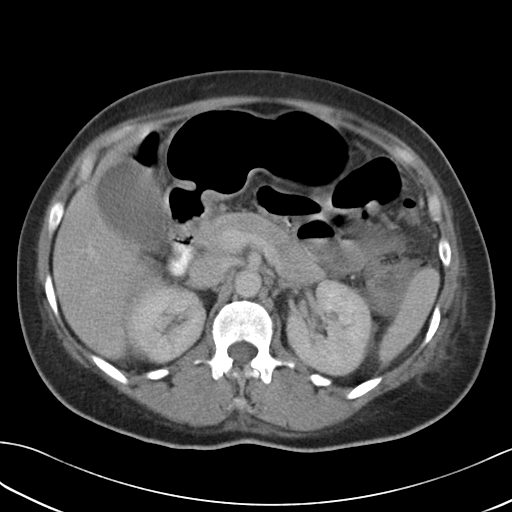
[im 71/101  bone]
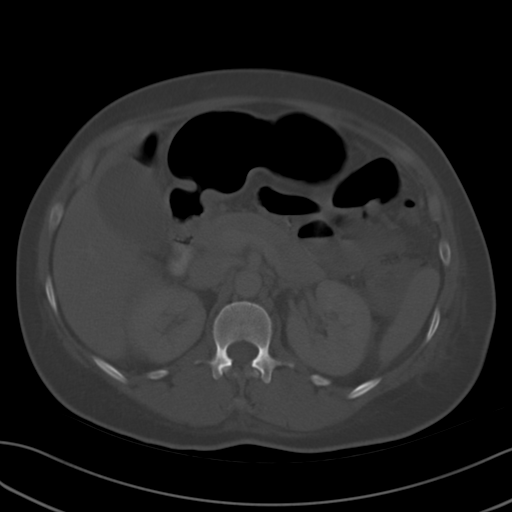
[im 81/101  soft-tissue]
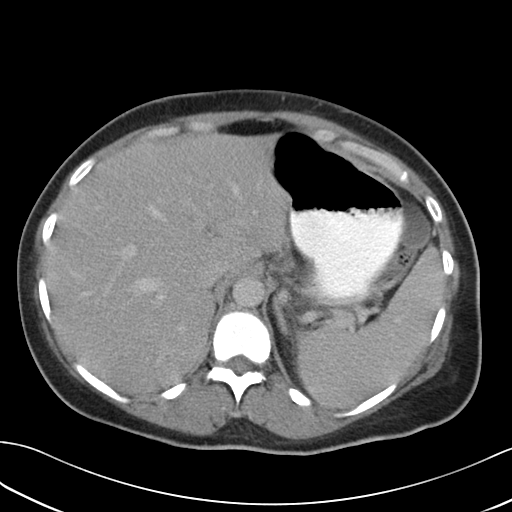
[im 81/101  lung]
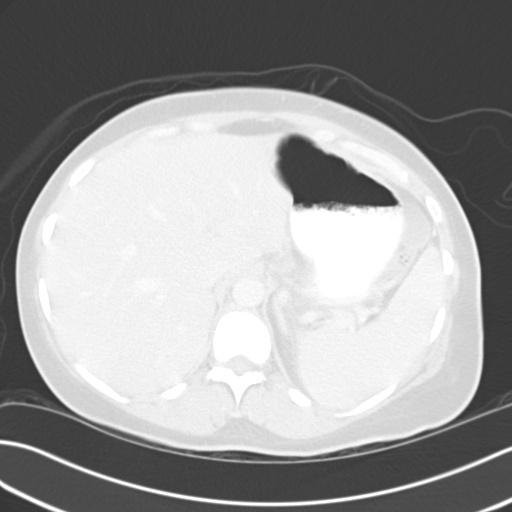
[im 86/101  soft-tissue]
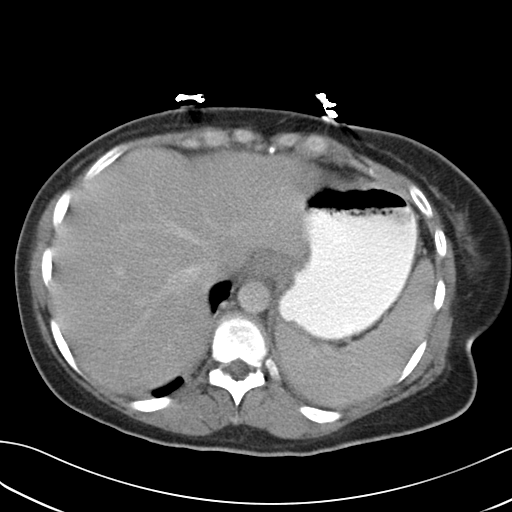
[im 86/101  lung]
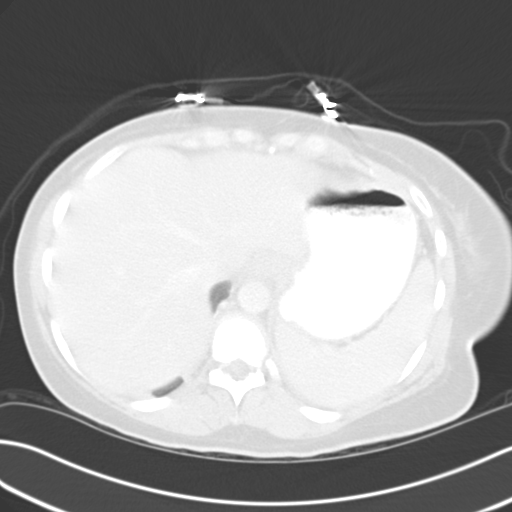
[im 91/101  lung]
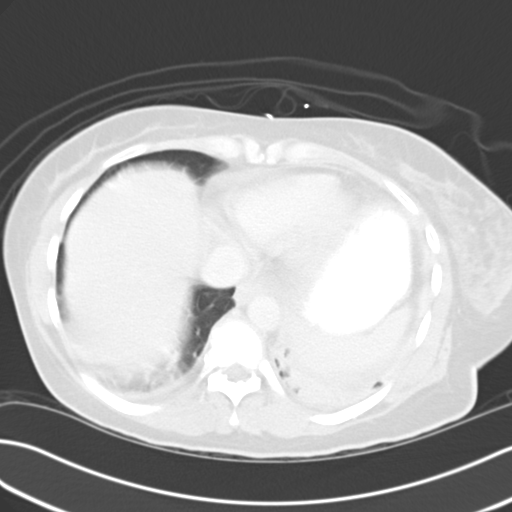
[im 96/101  soft-tissue]
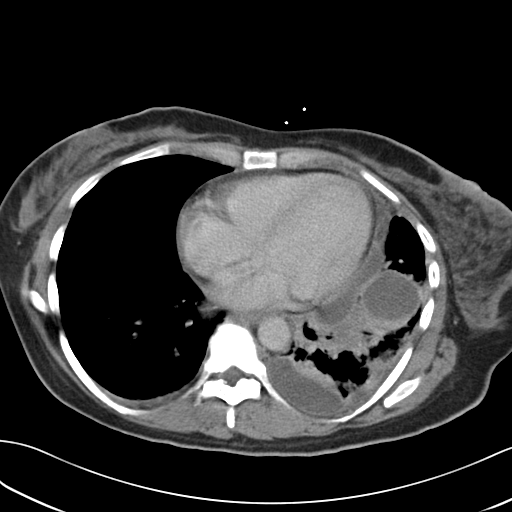
[im 96/101  lung]
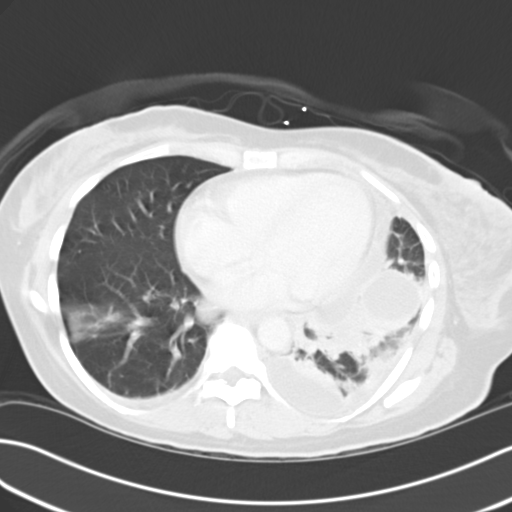

[13 of 32 positions shown; findings below may reference images not displayed]

FINDINGS: Lung bases demonstrate evidence of a small left-sided pleural
effusion both posteriorly as well as within the major fissure.
Minimal right-sided pleural effusion noted. Left basilar infiltrate
associated with the effusion is seen. This is stable from previous
plain film examination.

The liver, gallbladder, spleen, adrenal glands and pancreas are
within normal limits. The kidneys are well visualized bilaterally
and demonstrate a normal enhancement pattern. No renal calculi or
urinary tract obstructive changes are noted. Delayed images
demonstrate normal excretion of contrast material.

Dilatation of the small bowel is noted starting just beyond the
ligament of Treitz. The dilatation extends throughout the jejunum
and likely into the proximal ileum. There is a caliber change noted
in the left lower abdomen although no definitive mass lesion is
seen. Note twisting or kinking of bowel loops is noted in this area.
The more distal ileum is fluid filled but is otherwise within normal
limits.

The appendix is within normal limits. The bladder is partially
distended. The uterus is within normal limits. There is considerable
amount of free fluid within the pelvis. The uterus is well
visualized and demonstrates a peripherally enhancing lesion likely
representing a uterine fibroid. The ovaries are not well visualized.
The osseous structures are within normal limits.
IMPRESSION: Considerable free fluid within the pelvis. Possibility of
perforation would deserve consideration although no free air is
seen.

Multiple dilated loops of jejunum in likely proximal ileum with a
transition zone identified in the left mid abdomen. No definitive
mass or abrupt kinking of bowel loops is noted at the transition
zone. These changes are consistent with a partial small bowel
obstruction.

Uterine fibroid change.

Changes in the left hemi thorax consistent with effusion and focal
infiltrate

## 2015-06-25 MED ORDER — KCL IN DEXTROSE-NACL 40-5-0.9 MEQ/L-%-% IV SOLN
INTRAVENOUS | Status: DC
  Administered 2015-06-25 – 2015-06-26 (×2): via INTRAVENOUS
  Filled 2015-06-25 (×2): qty 1000

## 2015-06-25 MED ORDER — IOHEXOL 240 MG/ML SOLN
25.0000 mL | INTRAMUSCULAR | Status: AC
  Administered 2015-06-25 (×2): 25 mL via ORAL

## 2015-06-25 MED ORDER — POTASSIUM CHLORIDE 10 MEQ/100ML IV SOLN
10.0000 meq | INTRAVENOUS | Status: DC
  Administered 2015-06-25 – 2015-06-26 (×5): 10 meq via INTRAVENOUS
  Filled 2015-06-25 (×6): qty 100

## 2015-06-25 MED ORDER — POTASSIUM CHLORIDE CRYS ER 20 MEQ PO TBCR
40.0000 meq | EXTENDED_RELEASE_TABLET | Freq: Once | ORAL | Status: DC
  Filled 2015-06-25: qty 2

## 2015-06-25 MED ORDER — IOHEXOL 300 MG/ML  SOLN
100.0000 mL | Freq: Once | INTRAMUSCULAR | Status: AC | PRN
  Administered 2015-06-25: 100 mL via INTRAVENOUS

## 2015-06-25 NOTE — Progress Notes (Signed)
Surgery Progress Note  KUB reviewed, relatively unchanged.  Will order CT.

## 2015-06-25 NOTE — Progress Notes (Signed)
Received critical lab value for Potassium of 2.7.  Orders received to administer supplementation by oral/pill form.  Pt refuses to take oral medication until she can have something to eat. Dr. Rexene Edison (GI) has consulted and advised that pt remain NPO until abd x-ray performed.  Dr. Leslye Peer informed that pt has refused.  Will continue to monitor.

## 2015-06-25 NOTE — Progress Notes (Signed)
CT reviewed.  Patient revisited and completely without nausea/vomiting and abdominal pain.  + small BM.  Have discussed suspicion of SBO radiographically but does not fit clinically.  Patient repeatedly asking for food and does not express understanding of her situation despite multiple attempts at explanation.  I have discussed possible NG tube but will instead obtain KUB in am to evaluate progression of contrast due to benign exam and hunger.  No acute surgical needs at this time.

## 2015-06-25 NOTE — Progress Notes (Signed)
Patient ID: Jessica Willis, female   DOB: Oct 08, 1965, 49 y.o.   MRN: [id number] [facility] PROGRESS NOTE  PCP: No PCP Per Patient  HPI/Subjective: Patient feels okay. No abdominal pain. No nausea or vomiting. She states that she passes gas and small bowel movement with urination  Objective: Filed Vitals:   06/25/15 1227  BP: 142/74  Pulse: 118  Temp: 99.3 F (37.4 C)  Resp: 18    Filed Weights   06/23/15 2343  Weight: 80.604 kg (177 lb 11.2 oz)    ROS: Review of Systems  Constitutional: Negative for fever and chills.  Eyes: Negative for blurred vision.  Respiratory: Negative for cough and shortness of breath.   Cardiovascular: Negative for chest pain.  Gastrointestinal: Negative for nausea, vomiting, abdominal pain, diarrhea and constipation.  Genitourinary: Negative for dysuria.  Musculoskeletal: Negative for joint pain.  Neurological: Negative for dizziness and headaches.   Exam: Physical Exam  Constitutional: She is oriented to person, place, and time.  HENT:  Nose: No mucosal edema.  Mouth/Throat: No oropharyngeal exudate or posterior oropharyngeal edema.  Eyes: Conjunctivae, EOM and lids are normal. Pupils are equal, round, and reactive to light.  Neck: No JVD present. Carotid bruit is not present. No edema present. No thyroid mass and no thyromegaly present.  Cardiovascular: S1 normal and S2 normal.  Exam reveals no gallop.   No murmur heard. Pulses:      Dorsalis pedis pulses are 2+ on the right side, and 2+ on the left side.  Respiratory: No respiratory distress. She has no wheezes. She has no rhonchi. She has no rales.  GI: Soft. She exhibits distension. Bowel sounds are decreased. There is no tenderness.  Musculoskeletal:       Right ankle: She exhibits no swelling.       Left ankle: She exhibits no swelling.  Lymphadenopathy:    She has no cervical adenopathy.  Neurological: She is alert and oriented to person, place, and time. No  cranial nerve deficit.  Skin: Skin is warm. No rash noted. Nails show no clubbing.  Psychiatric: She has a normal mood and affect.    Data Reviewed: Basic Metabolic Panel:  Recent Labs Lab 06/23/15 1822 06/24/15 0411 06/24/15 1609 06/25/15 0708  NA 139 139  --  141  K 2.7* 2.5* 3.1* 2.7*  CL 100* 100*  --  103  CO2 28 30  --  29  GLUCOSE 106* 118*  --  103*  BUN 17 16  --  15  CREATININE 0.65 0.55  --  0.53  CALCIUM 8.4* 7.8*  --  7.9*  MG 2.4  --  2.5*  --    Liver Function Tests:  Recent Labs Lab 06/23/15 1822  AST 21  ALT 10*  ALKPHOS 88  BILITOT 0.8  PROT 7.7  ALBUMIN 2.6*   CBC:  Recent Labs Lab 06/23/15 1822 06/24/15 0411 06/25/15 0708  WBC 23.7* 15.5* 17.5*  NEUTROABS 22.2*  --  15.9*  HGB 8.3* 7.5* 7.8*  HCT 27.6* 24.0* 26.0*  MCV 66.3* 66.3* 66.5*  PLT 434 353 453*    Recent Results (from the past 240 hour(s))  Culture, blood (routine x 2)     Status: None (Preliminary result)   Collection Time: 06/23/15  7:01 PM  Result Value Ref Range Status   Specimen Description BLOOD LEFT AC  Final   Special Requests   Final    BOTTLES DRAWN AEROBIC AND ANAEROBIC  AER 1CC ANA 3CC  Culture  Setup Time   Final    GRAM POSITIVE COCCI ANAEROBIC BOTTLE ONLY CRITICAL RESULT CALLED TO, READ BACK BY AND VERIFIED WITH: ALECIA BABB,RN 06/24/2015 1022 BY J RAZZAKSUAREZ,MT    Culture   Final    GROUP A STREP (S.PYOGENES) ISOLATED ANAEROBIC BOTTLE ONLY SUSCEPTIBILITIES TO FOLLOW There is no known Penicillin Resistant Beta Streptococcus in the [place] For patients that are Penicillin-allergic, Erythromycin is 85-94% susceptible, and Clindamycin is 80% susceptible.  Contact Microbiology within 7 days if sensitivity testing is  required.      Report Status PENDING  Incomplete  Culture, blood (routine x 2)     Status: None (Preliminary result)   Collection Time: 06/23/15  7:52 PM  Result Value Ref Range Status   Specimen Description BLOOD RIGHT ASSIST CONTROL   Final   Special Requests BOTTLES DRAWN AEROBIC AND ANAEROBIC 5CC  Final   Culture  Setup Time   Final    GRAM POSITIVE COCCI IN CHAINS ANAEROBIC BOTTLE ONLY CRITICAL VALUE NOTED.  VALUE IS CONSISTENT WITH PREVIOUSLY REPORTED AND CALLED VALUE.    Culture   Final    GROUP A STREP (S.PYOGENES) ISOLATED SUSCEPTIBILITIES TO FOLLOW There is no known Penicillin Resistant Beta Streptococcus in the [place] For patients that are Penicillin-allergic, Erythromycin is 85-94% susceptible, and Clindamycin is 80% susceptible.  Contact Microbiology within 7 days if sensitivity testing is  required.      Report Status PENDING  Incomplete     Studies: Dg Abd 2 Views  06/25/2015  CLINICAL DATA:  F/u intestinal obstruction EXAM: ABDOMEN - 2 VIEW COMPARISON:  06/24/2015 FINDINGS: Supine and erect views of the abdomen show significant dilatation of numerous small bowel loops which is increased since previous exam. No definite evidence for free intraperitoneal air. Small amount of colonic gas is identified, within normal caliber loops. Scattered phleboliths within the pelvis. Note is made of left lower lobe infiltrate, associated with pleural effusion. IMPRESSION: 1. Increased small bowel dilatation. 2. Left lower lobe infiltrate. Electronically Signed   By: Nolon Nations M.D.   On: 06/25/2015 09:43   Dg Abd 2 Views  06/24/2015  CLINICAL DATA:  Followup bowel obstruction. Lower abdominal pain, nausea. EXAM: ABDOMEN - 2 VIEW COMPARISON:  06/23/2015 FINDINGS: Dilated small bowel loops with air-fluid levels are again noted, similar prior study, compatible with small bowel obstruction. Mild gaseous distention of the stomach with air-fluid level. No free air organomegaly. IMPRESSION: Continued small bowel obstruction pattern without significant change. Electronically Signed   By: Rolm Baptise M.D.   On: 06/24/2015 13:20   Dg Abd Acute W/chest  06/23/2015  CLINICAL DATA:  49 year old female with nausea and vomiting,  fever, chills, body aches and generalized abdominal pain. Last bowel movement 2 days ago. EXAM: DG ABDOMEN ACUTE W/ 1V CHEST COMPARISON:  None. FINDINGS: Multiple dilated loops of small bowel in the upper abdomen demonstrating air- fluid levels on the erect image. Gas and liquid stool in normal caliber colon from cecum to rectum. No evidence of free intraperitoneal air on the erect image. Numerous pelvic phleboliths. No visible opaque urinary tract calculi. Cardiac silhouette upper normal in size. Hilar and mediastinal contours unremarkable. Asymmetric interstitial opacities throughout the left lung. Confluent airspace consolidation involving the left lower lobe. Right lung clear. Small left pleural effusion. IMPRESSION: 1. Partial small bowel obstruction. No evidence of free intraperitoneal air. 2. Left lower lobe pneumonia. Asymmetric interstitial opacities throughout the left lung. As the right lung is clear and the heart size  is upper normal, asymmetric pulmonary edema is not felt to be the likely cause. Lymphangitic carcinomatosis or an interstitial pneumonia can have this appearance. Follow-up chest x-ray is recommended after treatment of the acute pneumonia. Electronically Signed   By: Evangeline Dakin M.D.   On: 06/23/2015 20:39    Scheduled Meds: . azithromycin (ZITHROMAX) 500 MG IVPB  500 mg Intravenous Q24H  . cefTRIAXone (ROCEPHIN)  IV  1 g Intravenous Q24H  . enoxaparin (LOVENOX) injection  40 mg Subcutaneous Q24H  . pantoprazole (PROTONIX) IV  40 mg Intravenous BID  . potassium chloride  40 mEq Oral Once  . sodium chloride  3 mL Intravenous Q12H   Continuous Infusions: . dextrose 5 % and 0.9 % NaCl with KCl 40 mEq/L      Assessment/Plan:  1. Sepsis with group A  Streptococcus Pyogenes. Pneumonia seen on x-ray. Rocephin should cover. 2. Partial small bowel obstruction. Patient has a history of tubal ligation. Appreciate surgical consultation. CT scan of the abdomen ordered. 3. Severe  hypokalemia- increase potassium to 40 mEq per liter. Magnesium normal range. 4. Microcytic anemia, iron and TIBC low. Once able to tolerate diet will start iron.  Code Status:     Code Status Orders        Start     Ordered   06/23/15 2342  Full code   Continuous     06/23/15 2341     Disposition Plan: Home soon  Antibiotics:  Rocephin  Zithromax  Time spent: 20 minutes  Loletha Grayer  [facility]

## 2015-06-25 NOTE — Consult Note (Signed)
Surgery Consult Note  CC:  Nausea/vomiting, now improved  HPI: Jessica Willis is a pleasant, healthy 49 yo F who presents with nausea/vomiting.  She began having diarrhea approx 4 days ago, which lasted 1 day. She then began acutely vomiting.  No longer vomiting.  + subjective fevers.  Currently hungry.  Small bm yesterday.  +flatus.  No chills, night sweats, shortness of breath, chest pain, cough, dysuria/hematuria.  Active Ambulatory Problems    Diagnosis Date Noted  . No Active Ambulatory Problems   Resolved Ambulatory Problems    Diagnosis Date Noted  . No Resolved Ambulatory Problems   Past Medical History  Diagnosis Date  . Patient denies medical problems   . Anemia    Past Surgical History  Procedure Laterality Date  . Tubal ligation       Medication List    Notice    You have not been prescribed any medications.    No Known Allergies   Family History  Problem Relation Age of Onset  . Diabetes    . Hypertension     Social History   Social History  . Marital Status: Single    Spouse Name: N/A  . Number of Children: N/A  . Years of Education: N/A   Occupational History  . Not on file.   Social History Main Topics  . Smoking status: Never Smoker   . Smokeless tobacco: Not on file  . Alcohol Use: 0.6 oz/week    0 Standard drinks or equivalent, 1 Cans of beer per week     Comment: occassional  . Drug Use: No  . Sexual Activity: No   Other Topics Concern  . Not on file   Social History Narrative   ROS: Full ROS obtained, pertinent positives and negatives as above  Blood pressure 135/77, pulse 106, temperature 98.2 F (36.8 C), temperature source Oral, resp. rate 17, height 5\' 9"  (1.753 m), weight 177 lb 11.2 oz (80.604 kg), SpO2 95 %. GEN: NAD/A&Ox3 FACE: no obvious facial trauma, normal external nose, normal external ears EYES: no scleral icterus, no conjunctivitis HEAD: normocephalic atraumatic CV: RRR, no MRG RESP: moving air well, lungs  clear ABD: soft, nontender, nondistended EXT: moving all ext well, strength 5/5 NEURO: cnII-XII grossly intact, sensation intact all 4 ext  Labs: Reviewed, significant for WBC 17.5 (91% N), K 2.7  Imaging: Reviewed, significant multiple, dilated SB, stomach distended with fluid  A/P 49 yo with N/V, leukocytosis, impressive dilated sb and stomach on imaging.  Currently without abd pain and hungry.  Suggestive of SBO but this does not account for leukocytosis.  Could be gastroenteritis or ileus.  Will obtain KUB, if KUB still impressive would recommend CT scan to evaluate for site of obstruction.  May require NG tube decompression.

## 2015-06-26 ENCOUNTER — Inpatient Hospital Stay: Payer: Self-pay

## 2015-06-26 LAB — HEMOGLOBIN: Hemoglobin: 7.3 g/dL — ABNORMAL LOW (ref 12.0–16.0)

## 2015-06-26 LAB — CBC
HCT: 26.1 % — ABNORMAL LOW (ref 35.0–47.0)
HEMOGLOBIN: 7.4 g/dL — AB (ref 12.0–16.0)
MCH: 19.3 pg — AB (ref 26.0–34.0)
MCHC: 28.6 g/dL — AB (ref 32.0–36.0)
MCV: 67.5 fL — AB (ref 80.0–100.0)
Platelets: 507 10*3/uL — ABNORMAL HIGH (ref 150–440)
RBC: 3.86 MIL/uL (ref 3.80–5.20)
RDW: 16.1 % — ABNORMAL HIGH (ref 11.5–14.5)
WBC: 18.4 10*3/uL — ABNORMAL HIGH (ref 3.6–11.0)

## 2015-06-26 LAB — BASIC METABOLIC PANEL
ANION GAP: 7 (ref 5–15)
BUN: 9 mg/dL (ref 6–20)
CALCIUM: 7.9 mg/dL — AB (ref 8.9–10.3)
CO2: 27 mmol/L (ref 22–32)
Chloride: 108 mmol/L (ref 101–111)
Creatinine, Ser: 0.49 mg/dL (ref 0.44–1.00)
GFR calc Af Amer: >60 mL/min (ref >60)
GLUCOSE: 113 mg/dL — AB (ref 65–99)
POTASSIUM: 3.8 mmol/L (ref 3.5–5.1)
Sodium: 142 mmol/L (ref 135–145)

## 2015-06-26 IMAGING — CR DG ABDOMEN 2V
1 series · 2 of 2 positions shown · non-contrast
Comparison: [DATE] CT and radiographs

CLINICAL DATA: 49-year-old female with abdominal pain and
obstruction.

EXAM:
ABDOMEN - 2 VIEW

[Series 1: dg abd 2 views · 0.14mm/px · 2 of 2 slices shown]
[im 1/2]
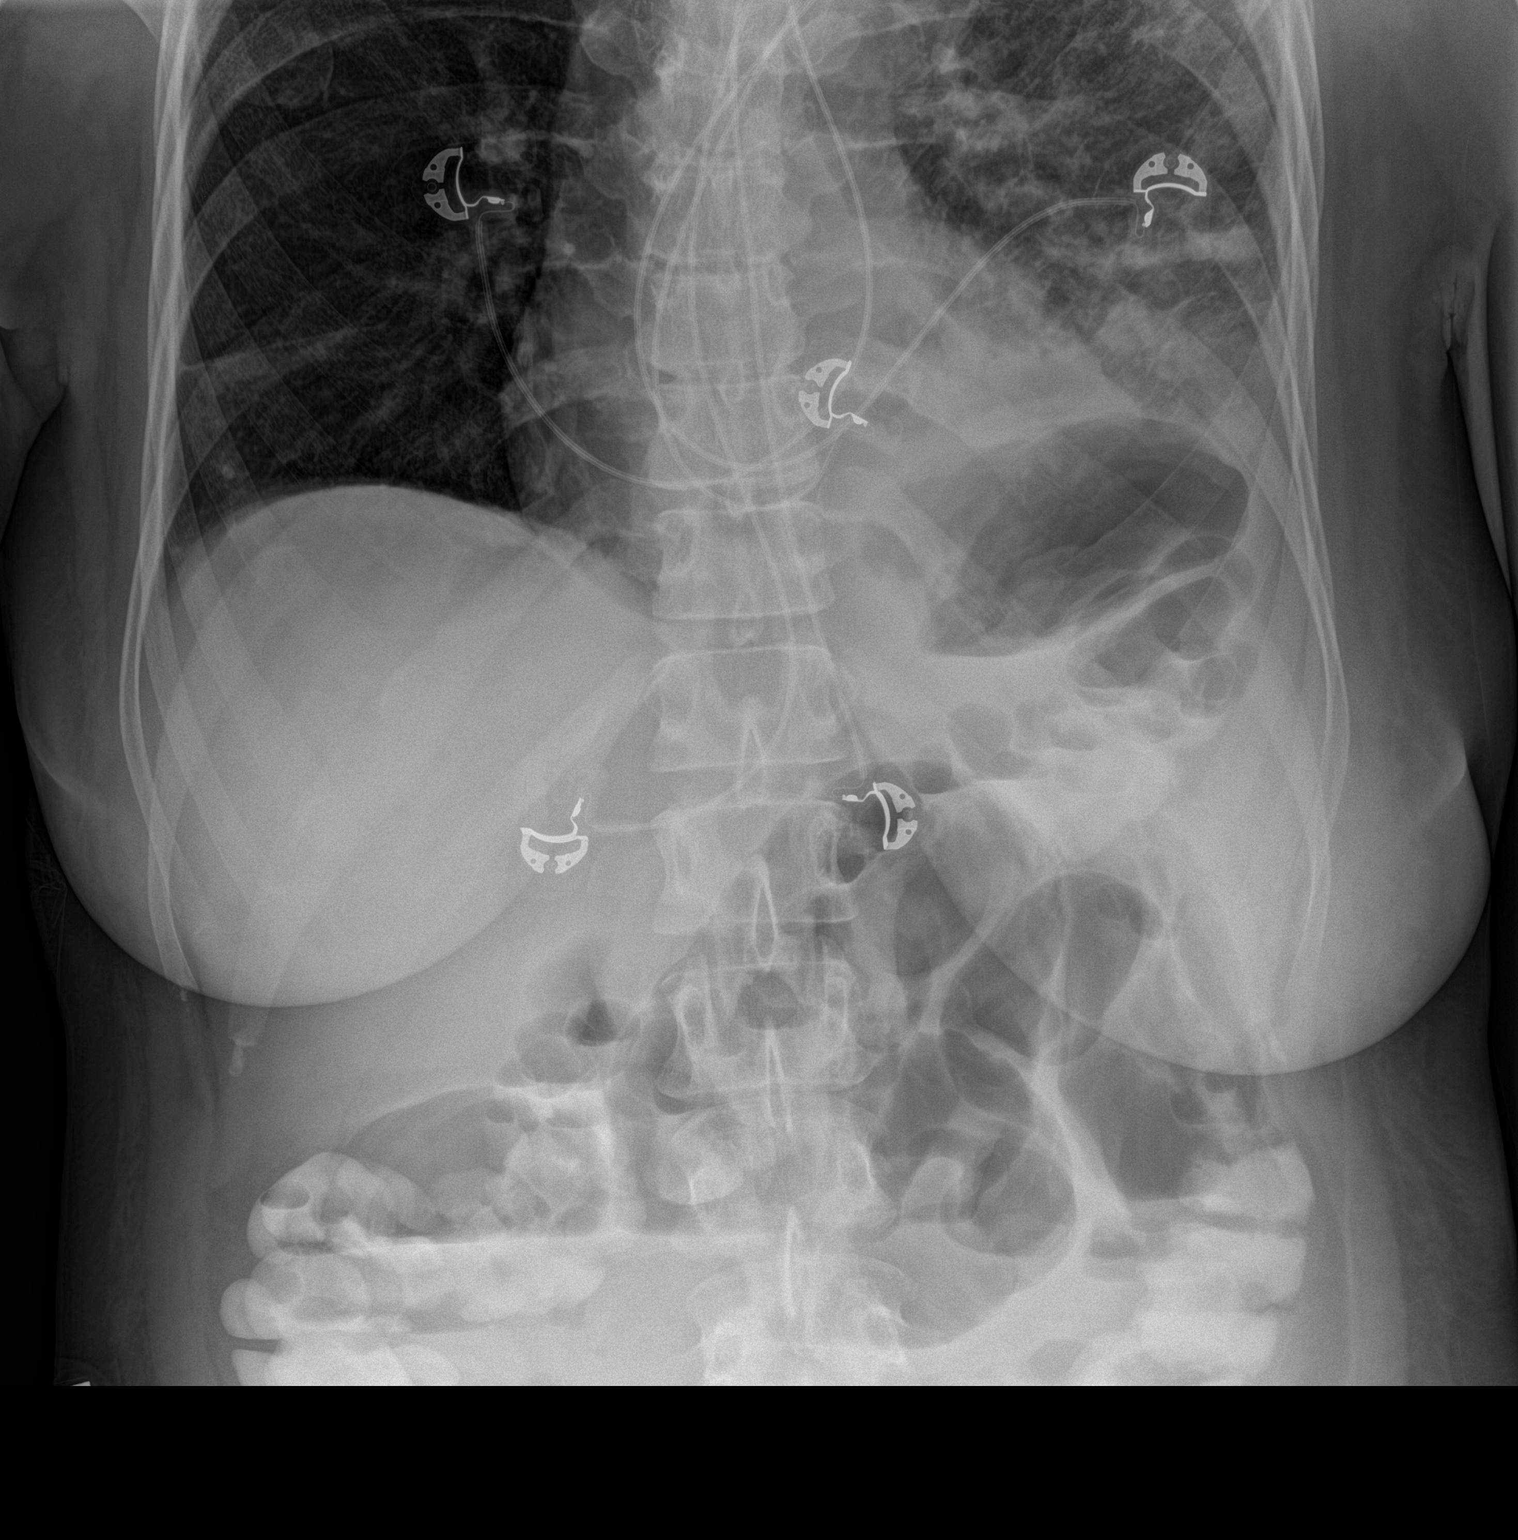
[im 2/2]
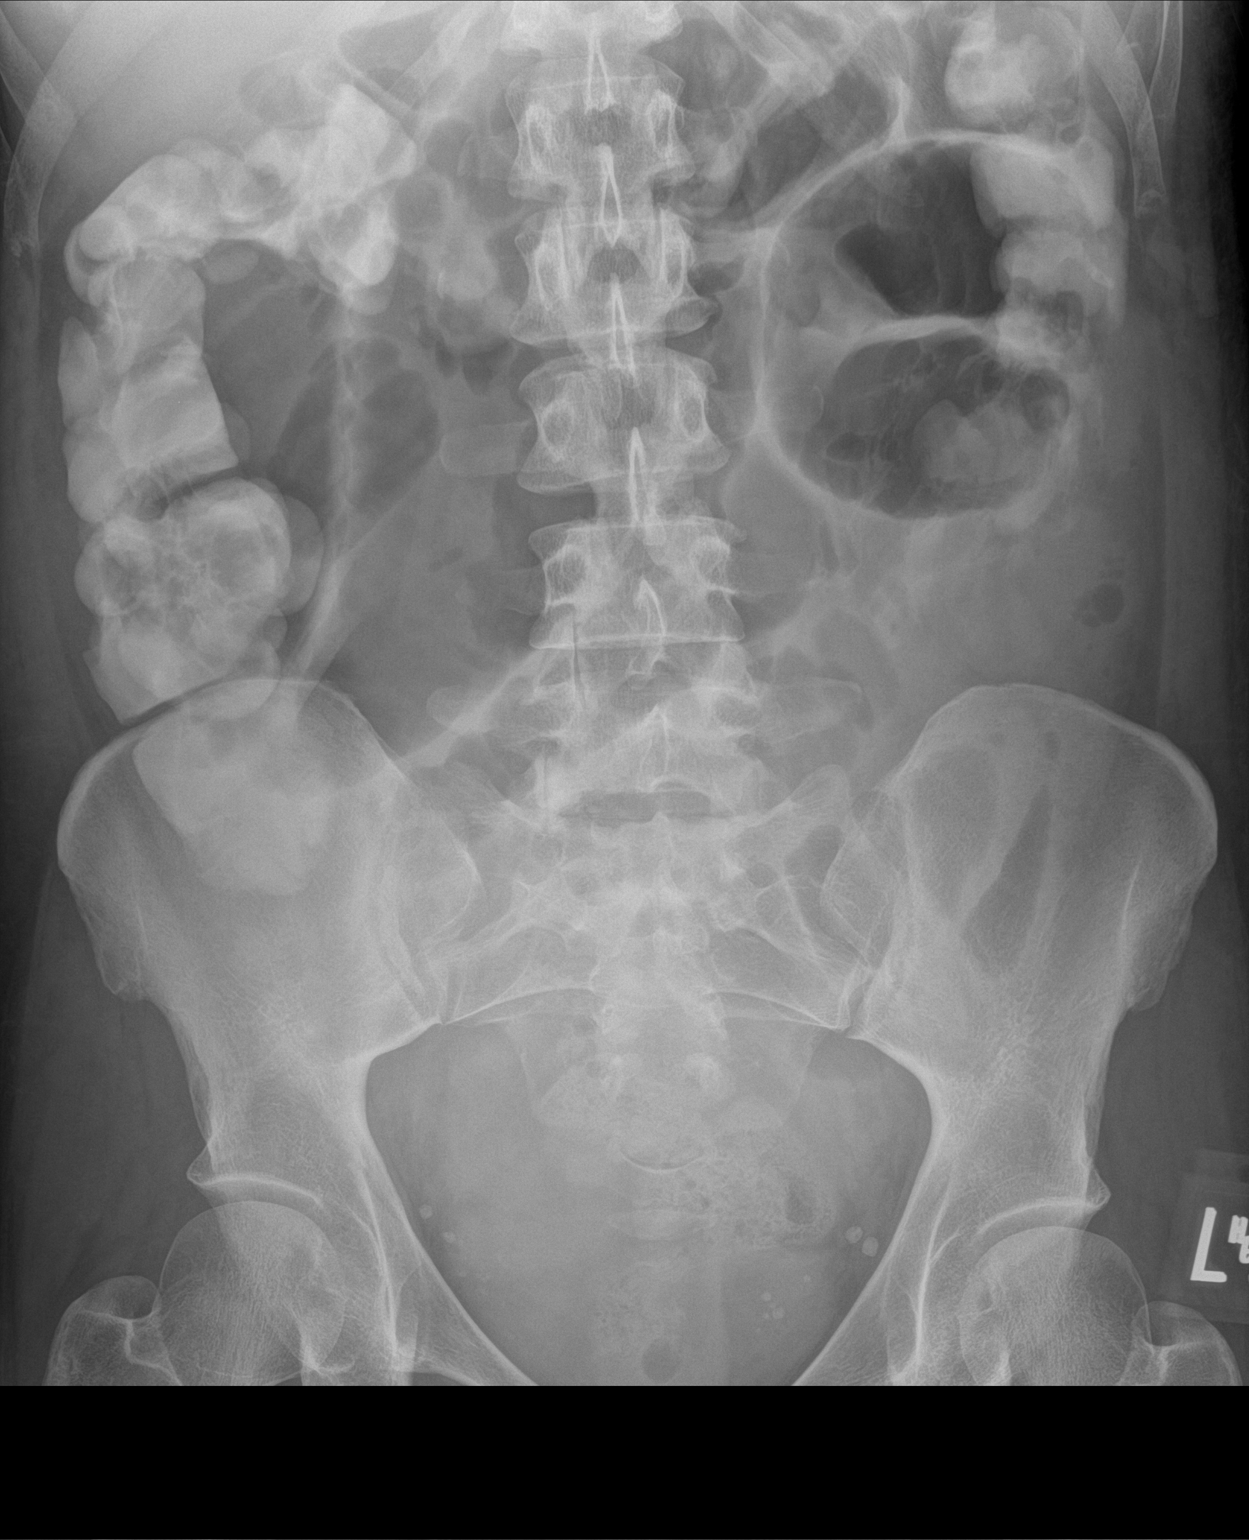

[2 of 2 positions shown; findings below may reference images not displayed]

FINDINGS: Dilated small bowel loops are identified, slightly improved.

Oral contrast from recent CT has now progressed into the colon.

There is no evidence of pneumoperitoneum.

Left basilar consolidation/airspace disease and loculated left
effusion again identified.
IMPRESSION: Slightly decreased small bowel distension with progression of oral
contrast (from recent CT) into the colon. No evidence of
pneumoperitoneum.

Continued left lower lung consolidation/airspace disease and
loculated left effusion.

## 2015-06-26 MED ORDER — KCL IN DEXTROSE-NACL 20-5-0.9 MEQ/L-%-% IV SOLN
INTRAVENOUS | Status: DC
  Administered 2015-06-26 – 2015-06-27 (×2): via INTRAVENOUS
  Filled 2015-06-26 (×3): qty 1000

## 2015-06-26 NOTE — Progress Notes (Signed)
Surgery Progress Note  S:  No nausea/vomiting, + BM, no abd pain O:Blood pressure 126/78, pulse 108, temperature 99.6 F (37.6 C), temperature source Oral, resp. rate 20, height 5\' 9"  (1.753 m), weight 177 lb 11.2 oz (80.604 kg), last menstrual period 06/16/2015, SpO2 93 %. GEN: NAD/A&Ox3 ABD: soft, nontender, nondistended  KUB: Contrast into colon  A/P 49 yo admit with N/V, fevers, + pneumonia, + dilated sb on KUB and CT.  No clinical findings consistent with SBO.  Hungry - clear liquid diet - no obvious indication for surgery

## 2015-06-26 NOTE — Progress Notes (Signed)
Patient ID: Jessica Willis, female   DOB: Apr 26, 1966, 49 y.o.   MRN: [id number] [facility] PROGRESS NOTE  PCP: No PCP Per Patient  HPI/Subjective: Patient has a little soreness in the lower abdomen. Having small bowel movements when she urinates. No vomiting. And low-grade temperature last night  Objective: Filed Vitals:   06/26/15 0630  BP: 126/78  Pulse: 108  Temp: 99.6 F (37.6 C)  Resp: 20    Filed Weights   06/23/15 2343  Weight: 80.604 kg (177 lb 11.2 oz)    ROS: Review of Systems  Constitutional: Positive for fever. Negative for chills.  Eyes: Negative for blurred vision.  Respiratory: Negative for cough and shortness of breath.   Cardiovascular: Negative for chest pain.  Gastrointestinal: Positive for abdominal pain. Negative for nausea, vomiting, diarrhea and constipation.  Genitourinary: Negative for dysuria.  Musculoskeletal: Negative for joint pain.  Neurological: Negative for dizziness and headaches.   Exam: Physical Exam  Constitutional: She is oriented to person, place, and time.  HENT:  Nose: No mucosal edema.  Mouth/Throat: No oropharyngeal exudate or posterior oropharyngeal edema.  Eyes: Conjunctivae, EOM and lids are normal. Pupils are equal, round, and reactive to light.  Neck: No JVD present. Carotid bruit is not present. No edema present. No thyroid mass and no thyromegaly present.  Cardiovascular: S1 normal and S2 normal.  Exam reveals no gallop.   No murmur heard. Pulses:      Dorsalis pedis pulses are 2+ on the right side, and 2+ on the left side.  Respiratory: No respiratory distress. She has no wheezes. She has no rhonchi. She has no rales.  GI: Soft. She exhibits distension. Bowel sounds are decreased. There is no tenderness.  Musculoskeletal:       Right ankle: She exhibits no swelling.       Left ankle: She exhibits no swelling.  Lymphadenopathy:    She has no cervical adenopathy.  Neurological: She is alert and  oriented to person, place, and time. No cranial nerve deficit.  Skin: Skin is warm. No rash noted. Nails show no clubbing.  Psychiatric: She has a normal mood and affect.    Data Reviewed: Basic Metabolic Panel:  Recent Labs Lab 06/23/15 1822 06/24/15 0411 06/24/15 1609 06/25/15 0708 06/26/15 0626  NA 139 139  --  141 142  K 2.7* 2.5* 3.1* 2.7* 3.8  CL 100* 100*  --  103 108  CO2 28 30  --  29 27  GLUCOSE 106* 118*  --  103* 113*  BUN 17 16  --  15 9  CREATININE 0.65 0.55  --  0.53 0.49  CALCIUM 8.4* 7.8*  --  7.9* 7.9*  MG 2.4  --  2.5*  --   --    Liver Function Tests:  Recent Labs Lab 06/23/15 1822  AST 21  ALT 10*  ALKPHOS 88  BILITOT 0.8  PROT 7.7  ALBUMIN 2.6*   CBC:  Recent Labs Lab 06/23/15 1822 06/24/15 0411 06/25/15 0708 06/26/15 0626  WBC 23.7* 15.5* 17.5*  --   NEUTROABS 22.2*  --  15.9*  --   HGB 8.3* 7.5* 7.8* 7.3*  HCT 27.6* 24.0* 26.0*  --   MCV 66.3* 66.3* 66.5*  --   PLT 434 353 453*  --     Recent Results (from the past 240 hour(s))  Culture, blood (routine x 2)     Status: None (Preliminary result)   Collection Time: 06/23/15  7:01 PM  Result  Value Ref Range Status   Specimen Description BLOOD LEFT AC  Final   Special Requests   Final    BOTTLES DRAWN AEROBIC AND ANAEROBIC  AER 1CC ANA 3CC   Culture  Setup Time   Final    GRAM POSITIVE COCCI ANAEROBIC BOTTLE ONLY CRITICAL RESULT CALLED TO, READ BACK BY AND VERIFIED WITH: ALECIA BABB,RN 06/24/2015 1022 BY J RAZZAKSUAREZ,MT    Culture   Final    GROUP A STREP (S.PYOGENES) ISOLATED ANAEROBIC BOTTLE ONLY SUSCEPTIBILITIES TO FOLLOW There is no known Penicillin Resistant Beta Streptococcus in the [place] For patients that are Penicillin-allergic, Erythromycin is 85-94% susceptible, and Clindamycin is 80% susceptible.  Contact Microbiology within 7 days if sensitivity testing is  required.      Report Status PENDING  Incomplete  Culture, blood (routine x 2)     Status: None  (Preliminary result)   Collection Time: 06/23/15  7:52 PM  Result Value Ref Range Status   Specimen Description BLOOD RIGHT ASSIST CONTROL  Final   Special Requests BOTTLES DRAWN AEROBIC AND ANAEROBIC 5CC  Final   Culture  Setup Time   Final    GRAM POSITIVE COCCI IN CHAINS ANAEROBIC BOTTLE ONLY CRITICAL VALUE NOTED.  VALUE IS CONSISTENT WITH PREVIOUSLY REPORTED AND CALLED VALUE.    Culture   Final    GROUP A STREP (S.PYOGENES) ISOLATED SUSCEPTIBILITIES TO FOLLOW There is no known Penicillin Resistant Beta Streptococcus in the [place] For patients that are Penicillin-allergic, Erythromycin is 85-94% susceptible, and Clindamycin is 80% susceptible.  Contact Microbiology within 7 days if sensitivity testing is  required.      Report Status PENDING  Incomplete     Studies: Ct Abdomen Pelvis W Contrast  06/25/2015  CLINICAL DATA:  Abdominal pain for several weeks, elevated white blood cell count EXAM: CT ABDOMEN AND PELVIS WITH CONTRAST TECHNIQUE: Multidetector CT imaging of the abdomen and pelvis was performed using the standard protocol following bolus administration of intravenous contrast. CONTRAST:  149mL OMNIPAQUE IOHEXOL 300 MG/ML  SOLN COMPARISON:  Recent plain film examinations from previous data FINDINGS: Lung bases demonstrate evidence of a small left-sided pleural effusion both posteriorly as well as within the major fissure. Minimal right-sided pleural effusion noted. Left basilar infiltrate associated with the effusion is seen. This is stable from previous plain film examination. The liver, gallbladder, spleen, adrenal glands and pancreas are within normal limits. The kidneys are well visualized bilaterally and demonstrate a normal enhancement pattern. No renal calculi or urinary tract obstructive changes are noted. Delayed images demonstrate normal excretion of contrast material. Dilatation of the small bowel is noted starting just beyond the ligament of Treitz. The dilatation extends  throughout the jejunum and likely into the proximal ileum. There is a caliber change noted in the left lower abdomen although no definitive mass lesion is seen. Note twisting or kinking of bowel loops is noted in this area. The more distal ileum is fluid filled but is otherwise within normal limits. The appendix is within normal limits. The bladder is partially distended. The uterus is within normal limits. There is considerable amount of free fluid within the pelvis. The uterus is well visualized and demonstrates a peripherally enhancing lesion likely representing a uterine fibroid. The ovaries are not well visualized. The osseous structures are within normal limits. IMPRESSION: Considerable free fluid within the pelvis. Possibility of perforation would deserve consideration although no free air is seen. Multiple dilated loops of jejunum in likely proximal ileum with a transition zone identified  in the left mid abdomen. No definitive mass or abrupt kinking of bowel loops is noted at the transition zone. These changes are consistent with a partial small bowel obstruction. Uterine fibroid change. Changes in the left hemi thorax consistent with effusion and focal infiltrate Electronically Signed   By: Inez Catalina M.D.   On: 06/25/2015 18:24   Dg Abd 2 Views  06/25/2015  CLINICAL DATA:  F/u intestinal obstruction EXAM: ABDOMEN - 2 VIEW COMPARISON:  06/24/2015 FINDINGS: Supine and erect views of the abdomen show significant dilatation of numerous small bowel loops which is increased since previous exam. No definite evidence for free intraperitoneal air. Small amount of colonic gas is identified, within normal caliber loops. Scattered phleboliths within the pelvis. Note is made of left lower lobe infiltrate, associated with pleural effusion. IMPRESSION: 1. Increased small bowel dilatation. 2. Left lower lobe infiltrate. Electronically Signed   By: Nolon Nations M.D.   On: 06/25/2015 09:43   Dg Abd 2  Views  06/24/2015  CLINICAL DATA:  Followup bowel obstruction. Lower abdominal pain, nausea. EXAM: ABDOMEN - 2 VIEW COMPARISON:  06/23/2015 FINDINGS: Dilated small bowel loops with air-fluid levels are again noted, similar prior study, compatible with small bowel obstruction. Mild gaseous distention of the stomach with air-fluid level. No free air organomegaly. IMPRESSION: Continued small bowel obstruction pattern without significant change. Electronically Signed   By: Rolm Baptise M.D.   On: 06/24/2015 13:20    Scheduled Meds: . cefTRIAXone (ROCEPHIN)  IV  1 g Intravenous Q24H  . enoxaparin (LOVENOX) injection  40 mg Subcutaneous Q24H  . pantoprazole (PROTONIX) IV  40 mg Intravenous BID  . sodium chloride  3 mL Intravenous Q12H   Continuous Infusions: . dextrose 5 % and 0.9 % NaCl with KCl 20 mEq/L      Assessment/Plan:  1. Sepsis with group A  Streptococcus Pyogenes. Pneumonia seen on x-ray. Rocephin will cover. I will get infectious disease consultation for tomorrow. Repeat blood cultures today 2. Partial small bowel obstruction. CT scan showing numerous small bowel dilation. Surgery following and they want to do conservative care with watching at this point. Patient is still nothing by mouth 3. Severe hypokalemia- replaced today. Decreased the amount of potassium in IV fluids. 4. Microcytic anemia, iron and TIBC low. Once able to tolerate diet will start iron. Hemoglobin today 7.3.  Code Status:     Code Status Orders        Start     Ordered   06/23/15 2342  Full code   Continuous     06/23/15 2341     Disposition Plan: To be determined  Antibiotics:  Rocephin  Time spent: 20 minutes  Loletha Grayer  [facility]

## 2015-06-27 ENCOUNTER — Inpatient Hospital Stay: Payer: Self-pay

## 2015-06-27 LAB — BODY FLUID CELL COUNT WITH DIFFERENTIAL
Eos, Fluid: 0 %
LYMPHS FL: 16 %
MONOCYTE-MACROPHAGE-SEROUS FLUID: 8 %
Neutrophil Count, Fluid: 76 %
OTHER CELLS FL: 0 %
Total Nucleated Cell Count, Fluid: 191 cu mm

## 2015-06-27 LAB — RAPID HIV SCREEN (HIV 1/2 AB+AG)
HIV 1/2 Antibodies: NONREACTIVE
HIV-1 P24 Antigen - HIV24: NONREACTIVE

## 2015-06-27 LAB — PROTEIN, BODY FLUID: Total protein, fluid: <3 g/dL

## 2015-06-27 LAB — GLUCOSE, SEROUS FLUID: GLUCOSE FL: 57 mg/dL

## 2015-06-27 IMAGING — CR DG CHEST 2V
1 series · 2 of 2 positions shown · non-contrast
Comparison: [DATE]

CLINICAL DATA: Status post left thoracentesis.

EXAM:
CHEST  2 VIEW

[Series 1: dg chest 2 view · 0.14mm/px · 2 of 2 slices shown]
[im 1/2]
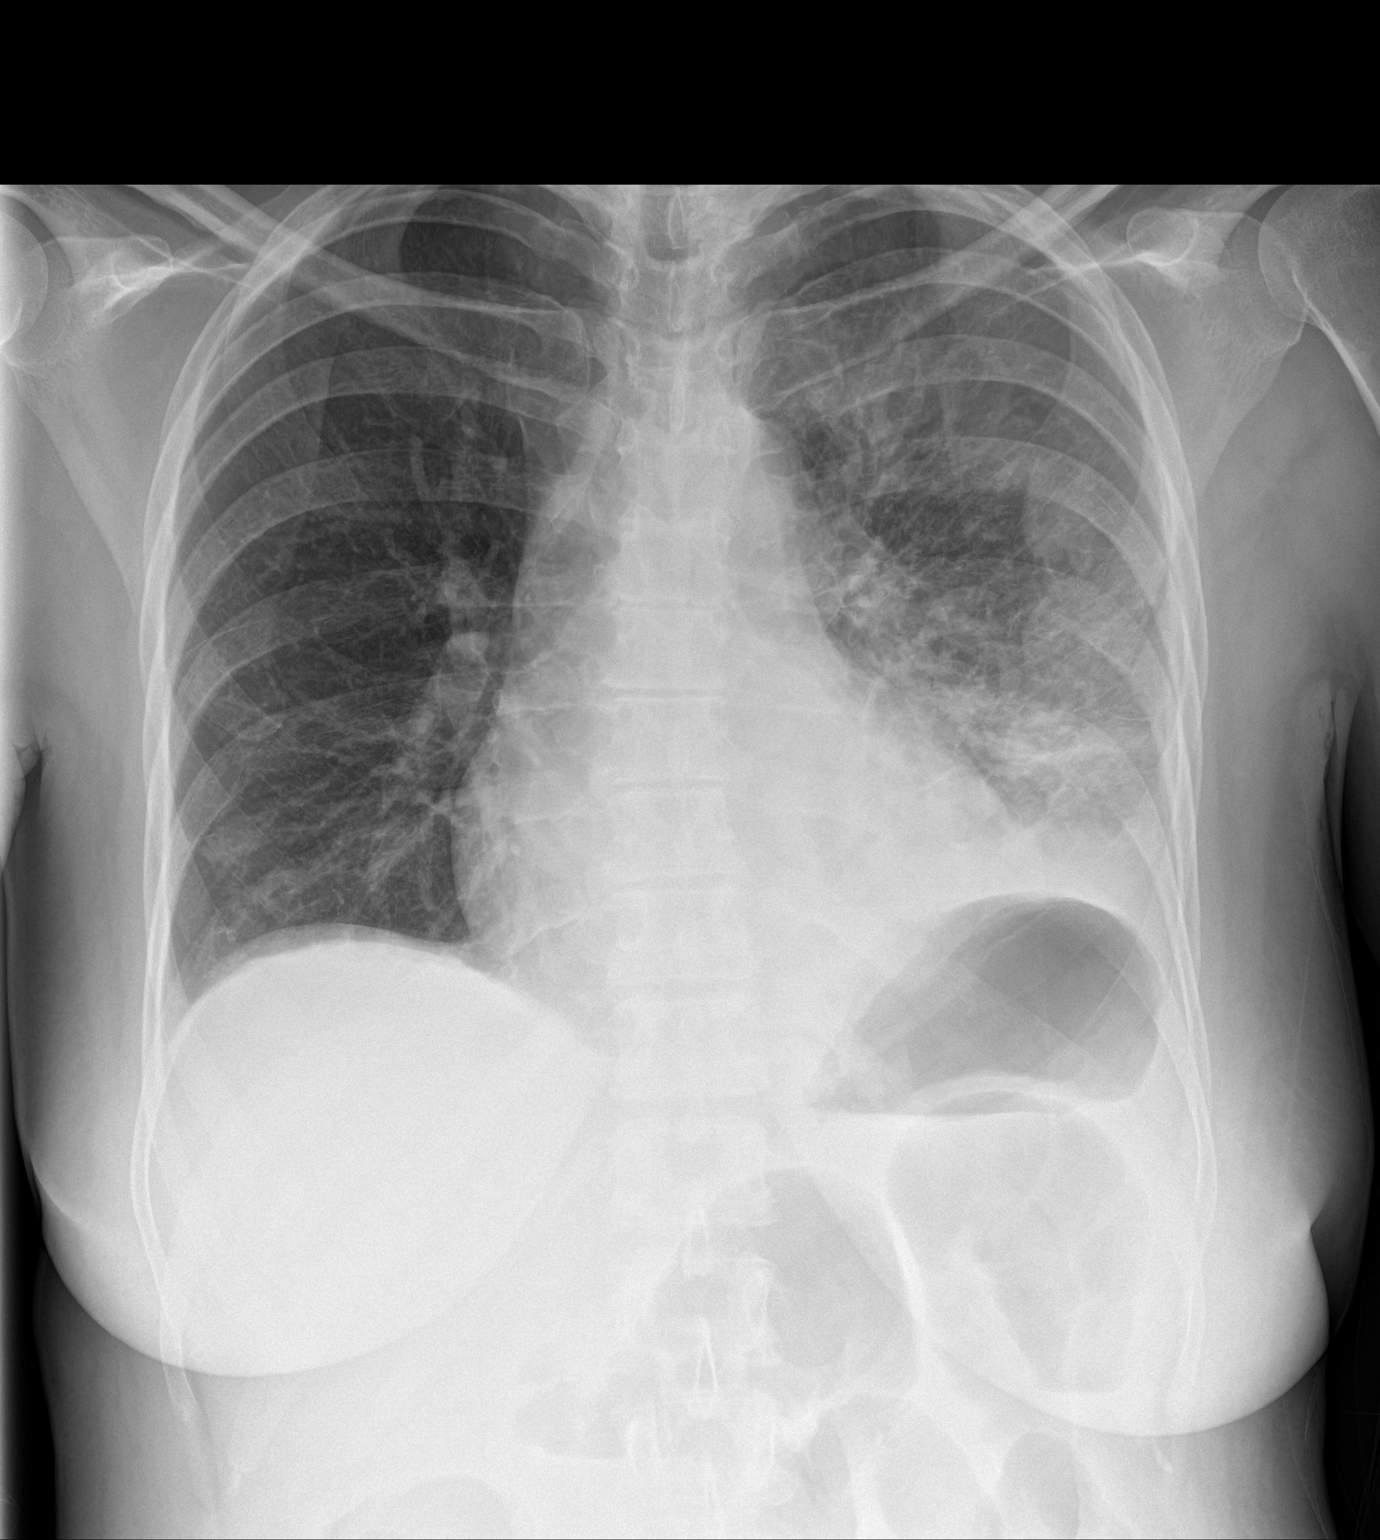
[im 2/2]
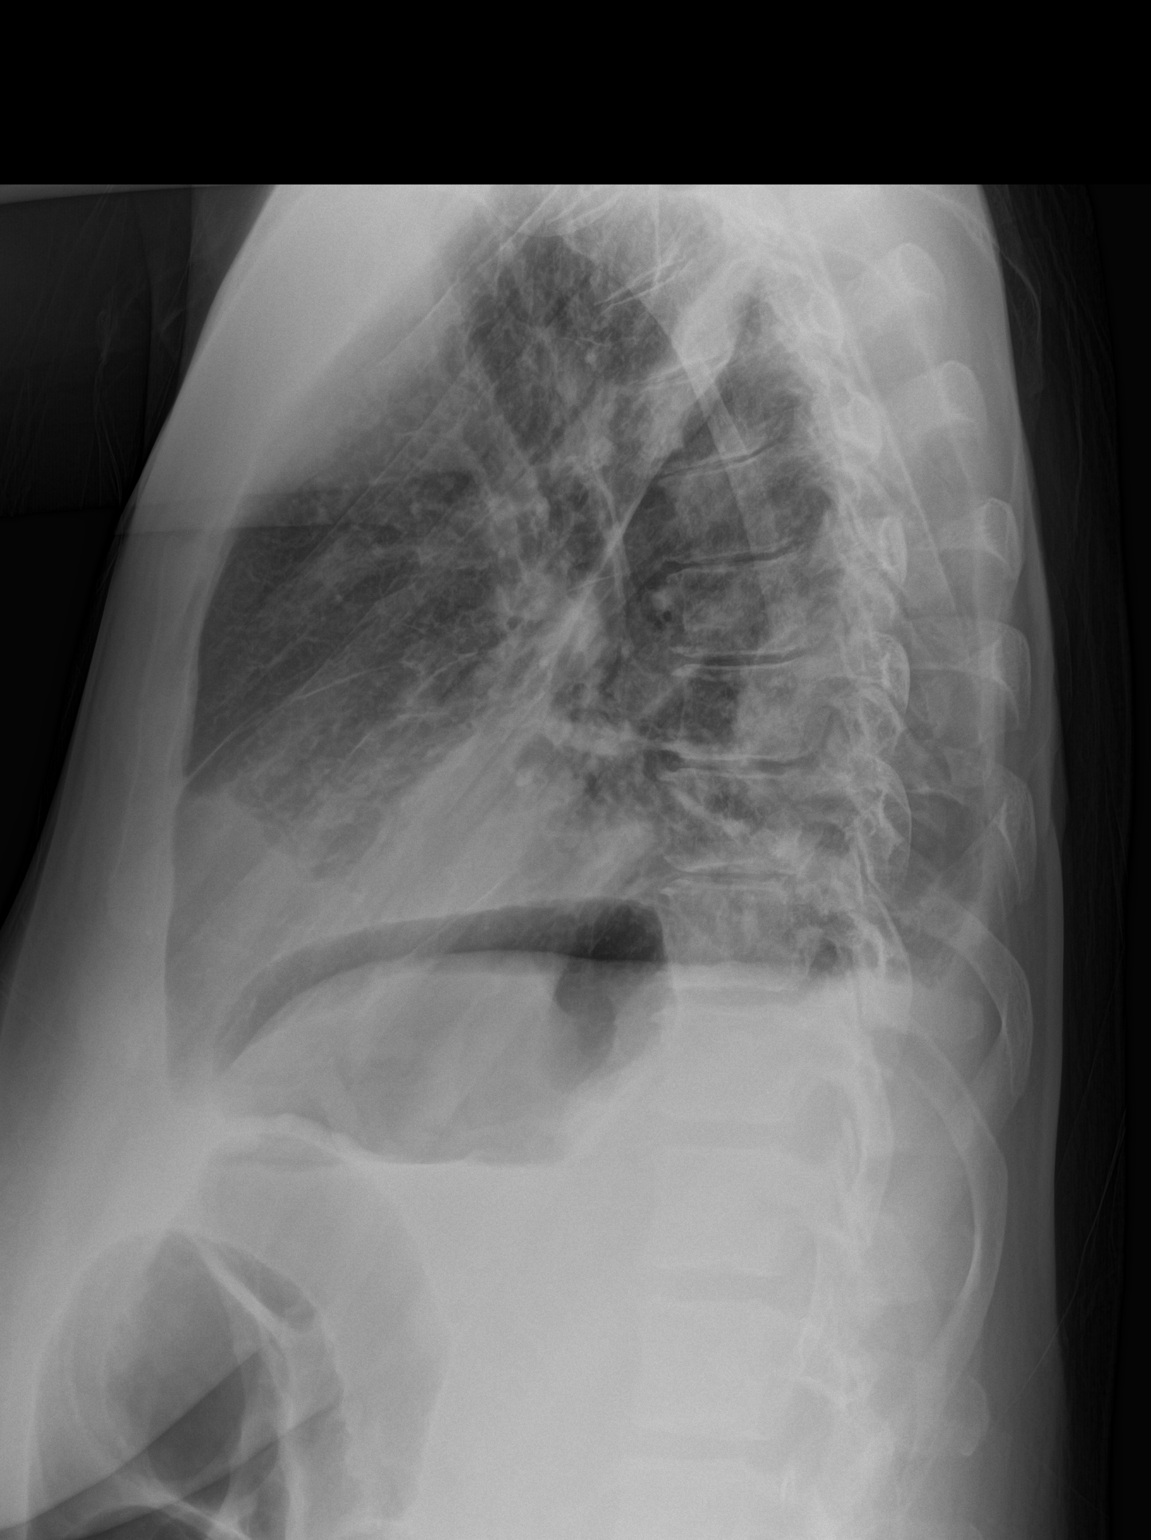

[2 of 2 positions shown; findings below may reference images not displayed]

FINDINGS: On the frontal view, there still irregular interstitial and hazy
airspace opacities in the central aspect of the lung and more
confluently at the left lung base. This does not appear
significantly changed following thoracentesis. On the lateral view
some fluid accumulates within the upper oblique fissure. There is
little definite residual fluid on the left. Remaining left lung
opacity appears to be atelectasis, pneumonia or a combination.

There is no pneumothorax.  Right lung is essentially clear.
IMPRESSION: 1. No evidence of a pneumothorax following thoracentesis.
2. There is significant residual left lung opacity, most evident in
the left lower lobe and left upper lobe lingula, consistent with
atelectasis, pneumonia or a combination.

## 2015-06-27 IMAGING — US US THORACENTESIS ASP PLEURAL SPACE W/IMG GUIDE
2 series · 4 of 4 positions shown · non-contrast
Comparison: none

CLINICAL DATA: Left pleural effusion

[Series 1: us thoracentesis asp pleural space w/img guide · 0.25mm/px · 3 of 3 slices shown (1 of 2)]
[im 1/3]
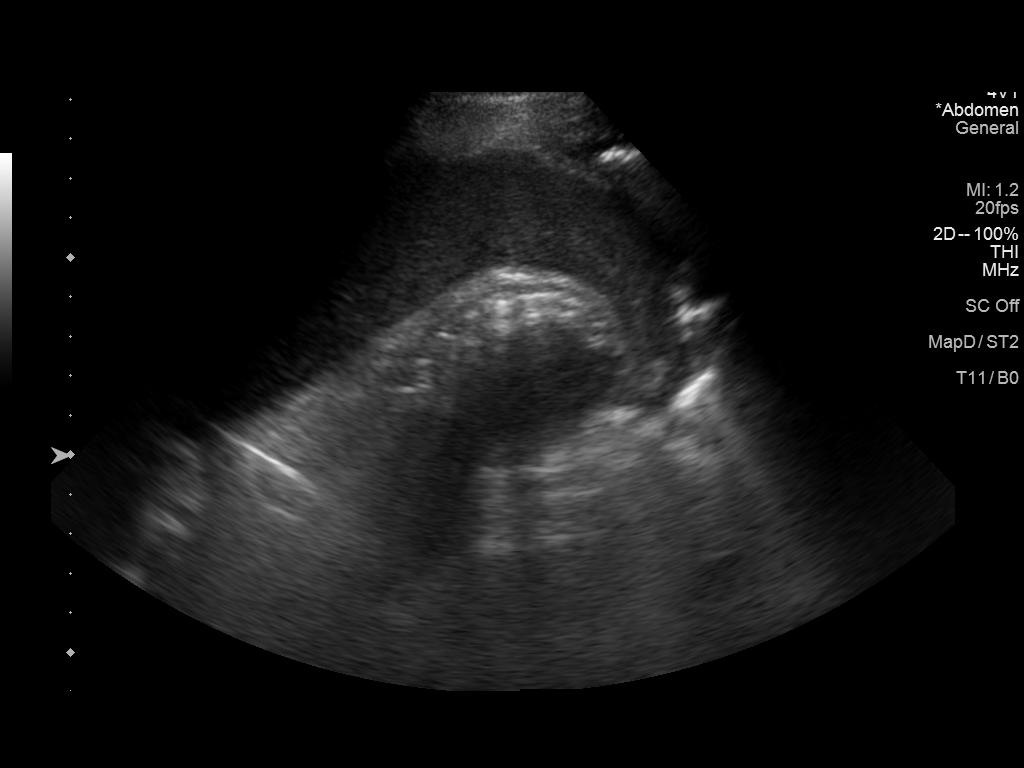
[im 2/3]
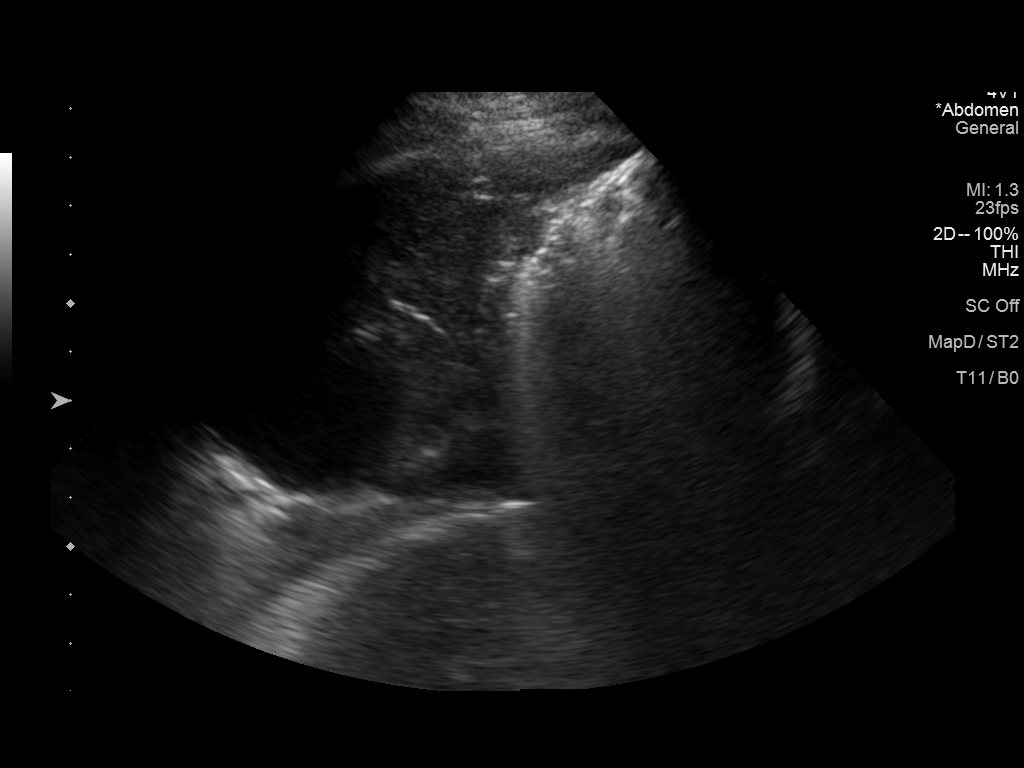
[im 3/3]
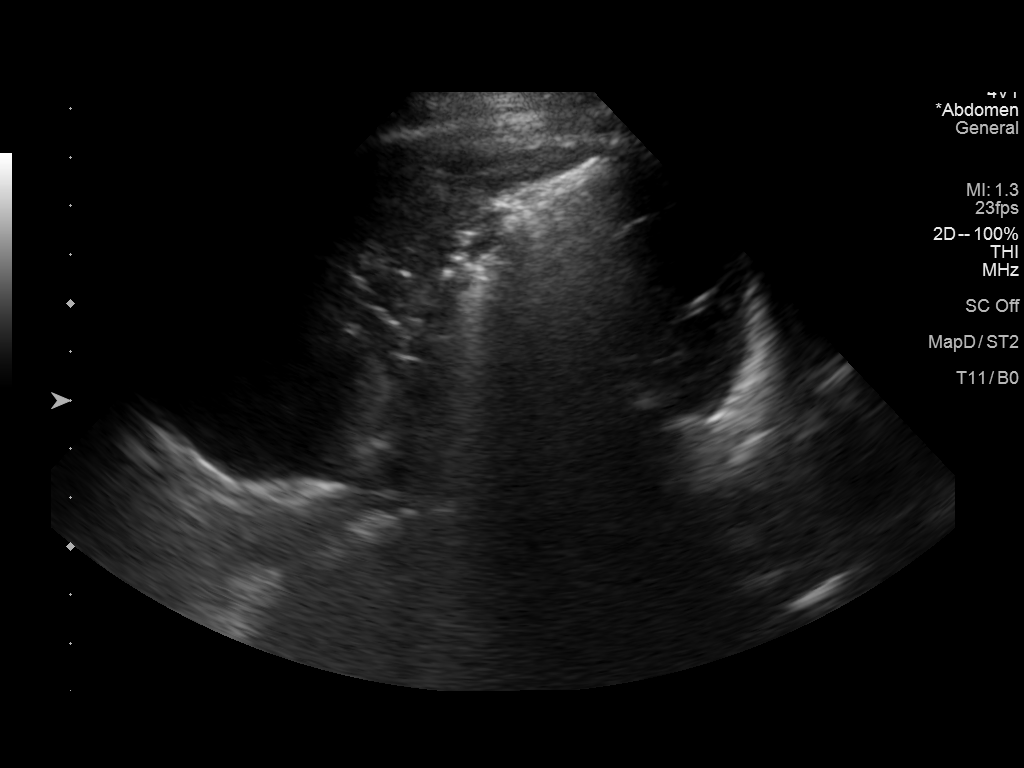

[Series 2: us thoracentesis asp pleural space w/img guide · 0.25mm/px · 1 of 1 slices shown (2 of 2)]
[im 1/1]
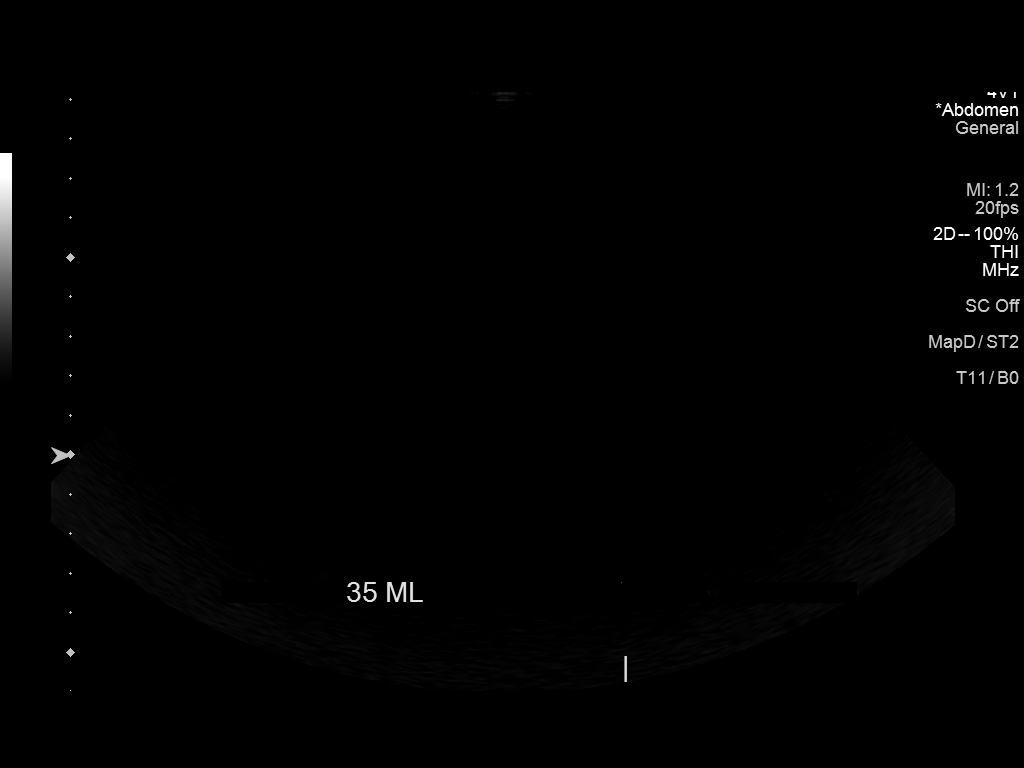

[4 of 4 positions shown; findings below may reference images not displayed]

EXAM:
ULTRASOUND GUIDED left THORACENTESIS

PROCEDURE:
An ultrasound guided thoracentesis was thoroughly discussed with the
patient and questions answered. The benefits, risks, alternatives
and complications were also discussed. The patient understands and
wishes to proceed with the procedure. Written consent was obtained.

Ultrasound was performed to localize and mark an adequate pocket of
fluid in the left chest. The area was then prepped and draped in the
normal sterile fashion. 1% Lidocaine was used for local anesthesia.
Under ultrasound guidance a 6 French thoracentesis catheter was
introduced. Thoracentesis was performed. The catheter was removed
and a dressing applied.

COMPLICATIONS:
None
FINDINGS: A total of approximately 50 mL of clear yellow fluid was removed. A
fluid sample was sent for laboratory analysis.
IMPRESSION: Successful ultrasound guided right/left thoracentesis yielding 50 mL
of pleural fluid. Fluid was noted to be somewhat loculated limiting
complete drainage. The amount of pleural fluid however was only of a
mild degree

## 2015-06-27 MED ORDER — PENICILLIN G POTASSIUM 5000000 UNITS IJ SOLR
4.0000 10*6.[IU] | INTRAVENOUS | Status: DC
  Administered 2015-06-27 – 2015-06-30 (×16): 4 10*6.[IU] via INTRAVENOUS
  Filled 2015-06-27 (×29): qty 4

## 2015-06-27 MED ORDER — CLINDAMYCIN PHOSPHATE 900 MG/50ML IV SOLN
900.0000 mg | Freq: Three times a day (TID) | INTRAVENOUS | Status: DC
  Administered 2015-06-27 – 2015-06-30 (×9): 900 mg via INTRAVENOUS
  Filled 2015-06-27 (×13): qty 50

## 2015-06-27 NOTE — Progress Notes (Signed)
Patient ID: Jessica Willis, female   DOB: 11-Jan-1966, 49 y.o.   MRN: [id number] [facility] PROGRESS NOTE  PCP: No PCP Per Patient  HPI/Subjective: Patient has some cough. Not much shortness of breath. No bowel movement yesterday. Tolerating liquid diet.  Objective: Filed Vitals:   06/27/15 0434  BP: 121/85  Pulse: 108  Temp: 97.5 F (36.4 C)  Resp: 17    Filed Weights   06/23/15 2343  Weight: 80.604 kg (177 lb 11.2 oz)    ROS: Review of Systems  Constitutional: Positive for diaphoresis. Negative for fever and chills.  Eyes: Negative for blurred vision.  Respiratory: Negative for cough and shortness of breath.   Cardiovascular: Negative for chest pain.  Gastrointestinal: Negative for nausea, vomiting, abdominal pain, diarrhea and constipation.  Genitourinary: Negative for dysuria.  Musculoskeletal: Negative for joint pain.  Neurological: Negative for dizziness and headaches.   Exam: Physical Exam  Constitutional: She is oriented to person, place, and time.  HENT:  Nose: No mucosal edema.  Mouth/Throat: No oropharyngeal exudate or posterior oropharyngeal edema.  Eyes: Conjunctivae, EOM and lids are normal. Pupils are equal, round, and reactive to light.  Neck: No JVD present. Carotid bruit is not present. No edema present. No thyroid mass and no thyromegaly present.  Cardiovascular: S1 normal and S2 normal.  Exam reveals no gallop.   No murmur heard. Pulses:      Dorsalis pedis pulses are 2+ on the right side, and 2+ on the left side.  Respiratory: No respiratory distress. She has decreased breath sounds in the right lower field and the left lower field. She has no wheezes. She has rhonchi in the right lower field and the left lower field. She has no rales.  GI: Soft. She exhibits distension. Bowel sounds are decreased. There is no tenderness.  Musculoskeletal:       Right ankle: She exhibits no swelling.       Left ankle: She exhibits no swelling.   Lymphadenopathy:    She has no cervical adenopathy.  Neurological: She is alert and oriented to person, place, and time. No cranial nerve deficit.  Skin: Skin is warm. No rash noted. Nails show no clubbing.  Psychiatric: She has a normal mood and affect.    Data Reviewed: Basic Metabolic Panel:  Recent Labs Lab 06/23/15 1822 06/24/15 0411 06/24/15 1609 06/25/15 0708 06/26/15 0626  NA 139 139  --  141 142  K 2.7* 2.5* 3.1* 2.7* 3.8  CL 100* 100*  --  103 108  CO2 28 30  --  29 27  GLUCOSE 106* 118*  --  103* 113*  BUN 17 16  --  15 9  CREATININE 0.65 0.55  --  0.53 0.49  CALCIUM 8.4* 7.8*  --  7.9* 7.9*  MG 2.4  --  2.5*  --   --    Liver Function Tests:  Recent Labs Lab 06/23/15 1822  AST 21  ALT 10*  ALKPHOS 88  BILITOT 0.8  PROT 7.7  ALBUMIN 2.6*   CBC:  Recent Labs Lab 06/23/15 1822 06/24/15 0411 06/25/15 0708 06/26/15 0626 06/26/15 0832  WBC 23.7* 15.5* 17.5*  --  18.4*  NEUTROABS 22.2*  --  15.9*  --   --   HGB 8.3* 7.5* 7.8* 7.3* 7.4*  HCT 27.6* 24.0* 26.0*  --  26.1*  MCV 66.3* 66.3* 66.5*  --  67.5*  PLT 434 353 453*  --  507*    Recent Results (from the  past 240 hour(s))  Culture, blood (routine x 2)     Status: None (Preliminary result)   Collection Time: 06/23/15  7:01 PM  Result Value Ref Range Status   Specimen Description BLOOD LEFT AC  Final   Special Requests   Final    BOTTLES DRAWN AEROBIC AND ANAEROBIC  AER 1CC ANA 3CC   Culture  Setup Time   Final    GRAM POSITIVE COCCI ANAEROBIC BOTTLE ONLY CRITICAL RESULT CALLED TO, READ BACK BY AND VERIFIED WITH: ALECIA BABB,RN 06/24/2015 1022 BY J RAZZAKSUAREZ,MT    Culture   Final    GROUP A STREP (S.PYOGENES) ISOLATED ANAEROBIC BOTTLE ONLY There is no known Penicillin Resistant Beta Streptococcus in the [place] For patients that are Penicillin-allergic, Erythromycin is 85-94% susceptible, and Clindamycin is 80% susceptible.  Contact Microbiology within 7 days if sensitivity testing is   required.      Report Status PENDING  Incomplete   Organism ID, Bacteria GROUP A STREP (S.PYOGENES) ISOLATED  Final      Susceptibility   Group a strep (s.pyogenes) isolated - MIC*    VANCOMYCIN Value in next row Sensitive      SENSITIVE0.5    CLINDAMYCIN Value in next row Sensitive      SENSITIVE<=0.25    AMPICILLIN Value in next row Sensitive      SENSITIVE<=0.25    LEVOFLOXACIN Value in next row Sensitive      SENSITIVE1    LINEZOLID Value in next row Sensitive      SENSITIVE<=2    ERYTHROMYCIN Value in next row Sensitive      SENSITIVE<=0.12    * GROUP A STREP (S.PYOGENES) ISOLATED  Culture, blood (routine x 2)     Status: None (Preliminary result)   Collection Time: 06/23/15  7:52 PM  Result Value Ref Range Status   Specimen Description BLOOD RIGHT ASSIST CONTROL  Final   Special Requests BOTTLES DRAWN AEROBIC AND ANAEROBIC 5CC  Final   Culture  Setup Time   Final    GRAM POSITIVE COCCI IN CHAINS ANAEROBIC BOTTLE ONLY CRITICAL VALUE NOTED.  VALUE IS CONSISTENT WITH PREVIOUSLY REPORTED AND CALLED VALUE.    Culture   Final    GROUP A STREP (S.PYOGENES) ISOLATED ANAEROBIC BOTTLE ONLY There is no known Penicillin Resistant Beta Streptococcus in the [place] For patients that are Penicillin-allergic, Erythromycin is 85-94% susceptible, and Clindamycin is 80% susceptible.  Contact Microbiology within 7 days if sensitivity testing is  required.      Report Status PENDING  Incomplete   Organism ID, Bacteria GROUP A STREP (S.PYOGENES) ISOLATED  Final      Susceptibility   Group a strep (s.pyogenes) isolated - MIC*    ERYTHROMYCIN Value in next row Sensitive      SENSITIVE<=0.12    VANCOMYCIN Value in next row Sensitive      SENSITIVE0.5    CLINDAMYCIN Value in next row Sensitive      SENSITIVE<=0.25    LEVOFLOXACIN Value in next row Sensitive      SENSITIVE1    LINEZOLID Value in next row Sensitive      SENSITIVE<=2    AMPICILLIN Value in next row Sensitive       SENSITIVE<=0.25    * GROUP A STREP (S.PYOGENES) ISOLATED     Studies: Ct Abdomen Pelvis W Contrast  06/25/2015  CLINICAL DATA:  Abdominal pain for several weeks, elevated white blood cell count EXAM: CT ABDOMEN AND PELVIS WITH CONTRAST TECHNIQUE: Multidetector CT imaging of  the abdomen and pelvis was performed using the standard protocol following bolus administration of intravenous contrast. CONTRAST:  158mL OMNIPAQUE IOHEXOL 300 MG/ML  SOLN COMPARISON:  Recent plain film examinations from previous data FINDINGS: Lung bases demonstrate evidence of a small left-sided pleural effusion both posteriorly as well as within the major fissure. Minimal right-sided pleural effusion noted. Left basilar infiltrate associated with the effusion is seen. This is stable from previous plain film examination. The liver, gallbladder, spleen, adrenal glands and pancreas are within normal limits. The kidneys are well visualized bilaterally and demonstrate a normal enhancement pattern. No renal calculi or urinary tract obstructive changes are noted. Delayed images demonstrate normal excretion of contrast material. Dilatation of the small bowel is noted starting just beyond the ligament of Treitz. The dilatation extends throughout the jejunum and likely into the proximal ileum. There is a caliber change noted in the left lower abdomen although no definitive mass lesion is seen. Note twisting or kinking of bowel loops is noted in this area. The more distal ileum is fluid filled but is otherwise within normal limits. The appendix is within normal limits. The bladder is partially distended. The uterus is within normal limits. There is considerable amount of free fluid within the pelvis. The uterus is well visualized and demonstrates a peripherally enhancing lesion likely representing a uterine fibroid. The ovaries are not well visualized. The osseous structures are within normal limits. IMPRESSION: Considerable free fluid within the  pelvis. Possibility of perforation would deserve consideration although no free air is seen. Multiple dilated loops of jejunum in likely proximal ileum with a transition zone identified in the left mid abdomen. No definitive mass or abrupt kinking of bowel loops is noted at the transition zone. These changes are consistent with a partial small bowel obstruction. Uterine fibroid change. Changes in the left hemi thorax consistent with effusion and focal infiltrate Electronically Signed   By: Inez Catalina M.D.   On: 06/25/2015 18:24   Dg Abd 2 Views  06/26/2015  CLINICAL DATA:  48 year old female with abdominal pain and obstruction. EXAM: ABDOMEN - 2 VIEW COMPARISON:  06/25/2015 CT and radiographs FINDINGS: Dilated small bowel loops are identified, slightly improved. Oral contrast from recent CT has now progressed into the colon. There is no evidence of pneumoperitoneum. Left basilar consolidation/airspace disease and loculated left effusion again identified. IMPRESSION: Slightly decreased small bowel distension with progression of oral contrast (from recent CT) into the colon. No evidence of pneumoperitoneum. Continued left lower lung consolidation/airspace disease and loculated left effusion. Electronically Signed   By: Margarette Canada M.D.   On: 06/26/2015 11:26   Dg Abd 2 Views  06/25/2015  CLINICAL DATA:  F/u intestinal obstruction EXAM: ABDOMEN - 2 VIEW COMPARISON:  06/24/2015 FINDINGS: Supine and erect views of the abdomen show significant dilatation of numerous small bowel loops which is increased since previous exam. No definite evidence for free intraperitoneal air. Small amount of colonic gas is identified, within normal caliber loops. Scattered phleboliths within the pelvis. Note is made of left lower lobe infiltrate, associated with pleural effusion. IMPRESSION: 1. Increased small bowel dilatation. 2. Left lower lobe infiltrate. Electronically Signed   By: Nolon Nations M.D.   On: 06/25/2015 09:43     Scheduled Meds: . cefTRIAXone (ROCEPHIN)  IV  1 g Intravenous Q24H  . enoxaparin (LOVENOX) injection  40 mg Subcutaneous Q24H  . pantoprazole (PROTONIX) IV  40 mg Intravenous BID  . sodium chloride  3 mL Intravenous Q12H   Continuous  Infusions: . dextrose 5 % and 0.9 % NaCl with KCl 20 mEq/L 50 mL/hr at 06/26/15 1755    Assessment/Plan:  1. Sepsis with group A  Streptococcus Pyogenes. Pneumonia with loculated pleural effusion. I spoke with interventional radiology we will do an ultrasound to see if there is enough fluid to draw off for a thoracentesis. Rocephin will cover. I will get infectious disease consultation. Repeat blood cultures yesterday 2. Partial small bowel obstruction. CT scan showing numerous small bowel dilation. Surgery advance diet to clear liquid diet yesterday. I will advance to full liquid today 3. Severe hypokalemia-improved 4. Microcytic anemia, iron and TIBC low. Once able to tolerate diet will start iron. Hemoglobin today 7.4.   Code Status:     Code Status Orders        Start     Ordered   06/23/15 2342  Full code   Continuous     06/23/15 2341     Disposition Plan: To be determined  Antibiotics:  Rocephin  Time spent: 20 minutes  Loletha Grayer  [facility]

## 2015-06-27 NOTE — Consult Note (Signed)
[facility] Infectious Disease     Reason for Consult:Group A strep bacteremia   Referring Physician: Earleen Newport Date of Admission:  06/23/2015   Principal Problem:   Sepsis ([facility]) Active Problems:   CAP (community acquired pneumonia)   Partial small bowel obstruction ([facility])   Hypokalemia   Intestinal obstruction ([facility])   Leukocytosis   HPI: Jessica Willis is a 49 y.o. female admitted 11/10 with fevers, chills, ,malaise, diarrhea for several days.  She also had L sided Chest pain and on admission had CXR with infiltrate and wbc elevated at 23.  CT with L effusion and infiltrate as well as free fluid in abd. She was started on meropenem and ceftraixone/azitrho.  Now on ctx. BCX + for Grp A strep. She underwent thoracentesis 11/14.  Protein pleural / total protein is < 0.5. Other labs pending. WBC down to 18, low grade fevers improving.   She has children at home but not ill.  Past Medical History  Diagnosis Date  . Patient denies medical problems   . Anemia    Past Surgical History  Procedure Laterality Date  . Tubal ligation     Social History  Substance Use Topics  . Smoking status: Never Smoker   . Smokeless tobacco: None  . Alcohol Use: 0.6 oz/week    0 Standard drinks or equivalent, 1 Cans of beer per week     Comment: occassional   Family History  Problem Relation Age of Onset  . Diabetes    . Hypertension      Allergies: No Known Allergies  Current antibiotics: Antibiotics Given (last 72 hours)    Date/Time Action Medication Dose Rate   06/24/15 1615 Given  [not compatible with other iv antibiotic]   cefTRIAXone (ROCEPHIN) 1 g in dextrose 5 % 50 mL IVPB 1 g 100 mL/hr   06/24/15 1800 Given   azithromycin (ZITHROMAX) 500 mg in dextrose 5 % 250 mL IVPB 500 mg 250 mL/hr   06/25/15 1747 Given   cefTRIAXone (ROCEPHIN) 1 g in dextrose 5 % 50 mL IVPB 1 g 100 mL/hr   06/25/15 1902 Given  [No IV access at the time of administration.]   azithromycin (ZITHROMAX)  500 mg in dextrose 5 % 250 mL IVPB 500 mg 250 mL/hr   06/26/15 1755 Given   cefTRIAXone (ROCEPHIN) 1 g in dextrose 5 % 50 mL IVPB 1 g 100 mL/hr      MEDICATIONS: . cefTRIAXone (ROCEPHIN)  IV  1 g Intravenous Q24H  . enoxaparin (LOVENOX) injection  40 mg Subcutaneous Q24H  . pantoprazole (PROTONIX) IV  40 mg Intravenous BID  . sodium chloride  3 mL Intravenous Q12H    Review of Systems - 11 systems reviewed and negative per HPI   OBJECTIVE: Temp:  [97.5 F (36.4 C)-98.9 F (37.2 C)] 97.5 F (36.4 C) (11/14 0434) Pulse Rate:  [108-115] 108 (11/14 0434) Resp:  [16-18] 16 (11/14 1223) BP: (119-134)/(14-85) 119/73 mmHg (11/14 1223) SpO2:  [95 %-97 %] 95 % (11/14 1223) Physical Exam  Constitutional:  oriented to person, place, and time. Appears dishelved  HENT: [place]/AT, PERRLA, no scleral icterus Mouth/Throat: Oropharynx is clear and dry . No oropharyngeal exudate.  Cardiovascular: Tachy  No murmur heard.  Pulmonary/Chest:rhonchi and decreased BS L base Neck = supple, no nuchal rigidity Abdominal: Soft. Bowel sounds are normal.  exhibits no distension. There is no tenderness.  Lymphadenopathy: no cervical adenopathy. No axillary adenopathy Neurological: alert and oriented to person, place, and time.  Skin:  Skin is warm and dry. No rash noted. No erythema.  Psychiatric: a normal mood and affect.  behavior is normal.    LABS: Results for orders placed or performed during the hospital encounter of 06/23/15 (from the past 48 hour(s))  Basic metabolic panel     Status: Abnormal   Collection Time: 06/26/15  6:26 AM  Result Value Ref Range   Sodium 142 135 - 145 mmol/L   Potassium 3.8 3.5 - 5.1 mmol/L   Chloride 108 101 - 111 mmol/L   CO2 27 22 - 32 mmol/L   Glucose, Bld 113 (H) 65 - 99 mg/dL   BUN 9 6 - 20 mg/dL   Creatinine, Ser 0.49 0.44 - 1.00 mg/dL   Calcium 7.9 (L) 8.9 - 10.3 mg/dL   GFR calc non Af Amer >60 >60 mL/min   GFR calc Af Amer >60 >60 mL/min    Comment:  (NOTE) The eGFR has been calculated using the CKD EPI equation. This calculation has not been validated in all clinical situations. eGFR's persistently <60 mL/min signify possible Chronic Kidney Disease.    Anion gap 7 5 - 15  Hemoglobin     Status: Abnormal   Collection Time: 06/26/15  6:26 AM  Result Value Ref Range   Hemoglobin 7.3 (L) 12.0 - 16.0 g/dL  Culture, blood (routine x 2)     Status: None (Preliminary result)   Collection Time: 06/26/15  8:30 AM  Result Value Ref Range   Specimen Description BLOOD RIGHT HAND    Special Requests BOTTLES DRAWN AEROBIC AND ANAEROBIC  4CC    Culture NO GROWTH 1 DAY    Report Status PENDING   Culture, blood (routine x 2)     Status: None (Preliminary result)   Collection Time: 06/26/15  8:31 AM  Result Value Ref Range   Specimen Description BLOOD RIGHT HAND    Special Requests      BOTTLES DRAWN AEROBIC AND ANAEROBIC  AER 3CC ANA 6CC   Culture NO GROWTH 1 DAY    Report Status PENDING   CBC     Status: Abnormal   Collection Time: 06/26/15  8:32 AM  Result Value Ref Range   WBC 18.4 (H) 3.6 - 11.0 K/uL   RBC 3.86 3.80 - 5.20 MIL/uL   Hemoglobin 7.4 (L) 12.0 - 16.0 g/dL    Comment: RESULT REPEATED AND VERIFIED   HCT 26.1 (L) 35.0 - 47.0 %    Comment: RESULT REPEATED AND VERIFIED   MCV 67.5 (L) 80.0 - 100.0 fL   MCH 19.3 (L) 26.0 - 34.0 pg   MCHC 28.6 (L) 32.0 - 36.0 g/dL   RDW 16.1 (H) 11.5 - 14.5 %   Platelets 507 (H) 150 - 440 K/uL  Protein, pleural or peritoneal fluid     Status: None   Collection Time: 06/27/15 12:20 PM  Result Value Ref Range   Total protein, fluid <3.0 g/dL    Comment: (NOTE) No normal range established for this test Results should be evaluated in conjunction with serum values    Fluid Type-FTP CYTO PLEU   Glucose, pleural or peritoneal fluid     Status: None   Collection Time: 06/27/15 12:20 PM  Result Value Ref Range   Glucose, Fluid 57 mg/dL    Comment: (NOTE) No normal range established for this  test Results should be evaluated in conjunction with serum values    Fluid Type-FGLU CYTO PLEU    No components found for: ESR, C  REACTIVE PROTEIN MICRO: Recent Results (from the past 720 hour(s))  Culture, blood (routine x 2)     Status: None (Preliminary result)   Collection Time: 06/23/15  7:01 PM  Result Value Ref Range Status   Specimen Description BLOOD LEFT AC  Final   Special Requests   Final    BOTTLES DRAWN AEROBIC AND ANAEROBIC  AER 1CC ANA 3CC   Culture  Setup Time   Final    GRAM POSITIVE COCCI ANAEROBIC BOTTLE ONLY CRITICAL RESULT CALLED TO, READ BACK BY AND VERIFIED WITH: ALECIA BABB,RN 06/24/2015 1022 BY J RAZZAKSUAREZ,MT    Culture   Final    GROUP A STREP (S.PYOGENES) ISOLATED ANAEROBIC BOTTLE ONLY There is no known Penicillin Resistant Beta Streptococcus in the [place] For patients that are Penicillin-allergic, Erythromycin is 85-94% susceptible, and Clindamycin is 80% susceptible.  Contact Microbiology within 7 days if sensitivity testing is  required.      Report Status PENDING  Incomplete   Organism ID, Bacteria GROUP A STREP (S.PYOGENES) ISOLATED  Final      Susceptibility   Group a strep (s.pyogenes) isolated - MIC*    VANCOMYCIN Value in next row Sensitive      SENSITIVE0.5    CLINDAMYCIN Value in next row Sensitive      SENSITIVE<=0.25    AMPICILLIN Value in next row Sensitive      SENSITIVE<=0.25    LEVOFLOXACIN Value in next row Sensitive      SENSITIVE1    LINEZOLID Value in next row Sensitive      SENSITIVE<=2    ERYTHROMYCIN Value in next row Sensitive      SENSITIVE<=0.12    * GROUP A STREP (S.PYOGENES) ISOLATED  Culture, blood (routine x 2)     Status: None (Preliminary result)   Collection Time: 06/23/15  7:52 PM  Result Value Ref Range Status   Specimen Description BLOOD RIGHT ASSIST CONTROL  Final   Special Requests BOTTLES DRAWN AEROBIC AND ANAEROBIC 5CC  Final   Culture  Setup Time   Final    GRAM POSITIVE COCCI IN CHAINS ANAEROBIC  BOTTLE ONLY CRITICAL VALUE NOTED.  VALUE IS CONSISTENT WITH PREVIOUSLY REPORTED AND CALLED VALUE.    Culture   Final    GROUP A STREP (S.PYOGENES) ISOLATED ANAEROBIC BOTTLE ONLY There is no known Penicillin Resistant Beta Streptococcus in the [place] For patients that are Penicillin-allergic, Erythromycin is 85-94% susceptible, and Clindamycin is 80% susceptible.  Contact Microbiology within 7 days if sensitivity testing is  required.      Report Status PENDING  Incomplete   Organism ID, Bacteria GROUP A STREP (S.PYOGENES) ISOLATED  Final      Susceptibility   Group a strep (s.pyogenes) isolated - MIC*    ERYTHROMYCIN Value in next row Sensitive      SENSITIVE<=0.12    VANCOMYCIN Value in next row Sensitive      SENSITIVE0.5    CLINDAMYCIN Value in next row Sensitive      SENSITIVE<=0.25    LEVOFLOXACIN Value in next row Sensitive      SENSITIVE1    LINEZOLID Value in next row Sensitive      SENSITIVE<=2    AMPICILLIN Value in next row Sensitive      SENSITIVE<=0.25    * GROUP A STREP (S.PYOGENES) ISOLATED  Culture, blood (routine x 2)     Status: None (Preliminary result)   Collection Time: 06/26/15  8:30 AM  Result Value Ref Range Status   Specimen Description  BLOOD RIGHT HAND  Final   Special Requests BOTTLES DRAWN AEROBIC AND ANAEROBIC  4CC  Final   Culture NO GROWTH 1 DAY  Final   Report Status PENDING  Incomplete  Culture, blood (routine x 2)     Status: None (Preliminary result)   Collection Time: 06/26/15  8:31 AM  Result Value Ref Range Status   Specimen Description BLOOD RIGHT HAND  Final   Special Requests   Final    BOTTLES DRAWN AEROBIC AND ANAEROBIC  AER 3CC ANA 6CC   Culture NO GROWTH 1 DAY  Final   Report Status PENDING  Incomplete    IMAGING: Dg Chest 2 View  06/27/2015  CLINICAL DATA:  Status post left thoracentesis. EXAM: CHEST  2 VIEW COMPARISON:  06/23/2015 FINDINGS: On the frontal view, there still irregular interstitial and hazy airspace opacities in  the central aspect of the lung and more confluently at the left lung base. This does not appear significantly changed following thoracentesis. On the lateral view some fluid accumulates within the upper oblique fissure. There is little definite residual fluid on the left. Remaining left lung opacity appears to be atelectasis, pneumonia or a combination. There is no pneumothorax.  Right lung is essentially clear. IMPRESSION: 1. No evidence of a pneumothorax following thoracentesis. 2. There is significant residual left lung opacity, most evident in the left lower lobe and left upper lobe lingula, consistent with atelectasis, pneumonia or a combination. Electronically Signed   By: Lajean Manes M.D.   On: 06/27/2015 12:48   Ct Abdomen Pelvis W Contrast  06/25/2015  CLINICAL DATA:  Abdominal pain for several weeks, elevated white blood cell count EXAM: CT ABDOMEN AND PELVIS WITH CONTRAST TECHNIQUE: Multidetector CT imaging of the abdomen and pelvis was performed using the standard protocol following bolus administration of intravenous contrast. CONTRAST:  156m OMNIPAQUE IOHEXOL 300 MG/ML  SOLN COMPARISON:  Recent plain film examinations from previous data FINDINGS: Lung bases demonstrate evidence of a small left-sided pleural effusion both posteriorly as well as within the major fissure. Minimal right-sided pleural effusion noted. Left basilar infiltrate associated with the effusion is seen. This is stable from previous plain film examination. The liver, gallbladder, spleen, adrenal glands and pancreas are within normal limits. The kidneys are well visualized bilaterally and demonstrate a normal enhancement pattern. No renal calculi or urinary tract obstructive changes are noted. Delayed images demonstrate normal excretion of contrast material. Dilatation of the small bowel is noted starting just beyond the ligament of Treitz. The dilatation extends throughout the jejunum and likely into the proximal ileum. There  is a caliber change noted in the left lower abdomen although no definitive mass lesion is seen. Note twisting or kinking of bowel loops is noted in this area. The more distal ileum is fluid filled but is otherwise within normal limits. The appendix is within normal limits. The bladder is partially distended. The uterus is within normal limits. There is considerable amount of free fluid within the pelvis. The uterus is well visualized and demonstrates a peripherally enhancing lesion likely representing a uterine fibroid. The ovaries are not well visualized. The osseous structures are within normal limits. IMPRESSION: Considerable free fluid within the pelvis. Possibility of perforation would deserve consideration although no free air is seen. Multiple dilated loops of jejunum in likely proximal ileum with a transition zone identified in the left mid abdomen. No definitive mass or abrupt kinking of bowel loops is noted at the transition zone. These changes are consistent with  a partial small bowel obstruction. Uterine fibroid change. Changes in the left hemi thorax consistent with effusion and focal infiltrate Electronically Signed   By: Inez Catalina M.D.   On: 06/25/2015 18:24   Dg Abd 2 Views  06/26/2015  CLINICAL DATA:  49 year old female with abdominal pain and obstruction. EXAM: ABDOMEN - 2 VIEW COMPARISON:  06/25/2015 CT and radiographs FINDINGS: Dilated small bowel loops are identified, slightly improved. Oral contrast from recent CT has now progressed into the colon. There is no evidence of pneumoperitoneum. Left basilar consolidation/airspace disease and loculated left effusion again identified. IMPRESSION: Slightly decreased small bowel distension with progression of oral contrast (from recent CT) into the colon. No evidence of pneumoperitoneum. Continued left lower lung consolidation/airspace disease and loculated left effusion. Electronically Signed   By: Margarette Canada M.D.   On: 06/26/2015 11:26   Dg  Abd 2 Views  06/25/2015  CLINICAL DATA:  F/u intestinal obstruction EXAM: ABDOMEN - 2 VIEW COMPARISON:  06/24/2015 FINDINGS: Supine and erect views of the abdomen show significant dilatation of numerous small bowel loops which is increased since previous exam. No definite evidence for free intraperitoneal air. Small amount of colonic gas is identified, within normal caliber loops. Scattered phleboliths within the pelvis. Note is made of left lower lobe infiltrate, associated with pleural effusion. IMPRESSION: 1. Increased small bowel dilatation. 2. Left lower lobe infiltrate. Electronically Signed   By: Nolon Nations M.D.   On: 06/25/2015 09:43   Dg Abd 2 Views  06/24/2015  CLINICAL DATA:  Followup bowel obstruction. Lower abdominal pain, nausea. EXAM: ABDOMEN - 2 VIEW COMPARISON:  06/23/2015 FINDINGS: Dilated small bowel loops with air-fluid levels are again noted, similar prior study, compatible with small bowel obstruction. Mild gaseous distention of the stomach with air-fluid level. No free air organomegaly. IMPRESSION: Continued small bowel obstruction pattern without significant change. Electronically Signed   By: Rolm Baptise M.D.   On: 06/24/2015 13:20   Dg Abd Acute W/chest  06/23/2015  CLINICAL DATA:  49 year old female with nausea and vomiting, fever, chills, body aches and generalized abdominal pain. Last bowel movement 2 days ago. EXAM: DG ABDOMEN ACUTE W/ 1V CHEST COMPARISON:  None. FINDINGS: Multiple dilated loops of small bowel in the upper abdomen demonstrating air- fluid levels on the erect image. Gas and liquid stool in normal caliber colon from cecum to rectum. No evidence of free intraperitoneal air on the erect image. Numerous pelvic phleboliths. No visible opaque urinary tract calculi. Cardiac silhouette upper normal in size. Hilar and mediastinal contours unremarkable. Asymmetric interstitial opacities throughout the left lung. Confluent airspace consolidation involving the left  lower lobe. Right lung clear. Small left pleural effusion. IMPRESSION: 1. Partial small bowel obstruction. No evidence of free intraperitoneal air. 2. Left lower lobe pneumonia. Asymmetric interstitial opacities throughout the left lung. As the right lung is clear and the heart size is upper normal, asymmetric pulmonary edema is not felt to be the likely cause. Lymphangitic carcinomatosis or an interstitial pneumonia can have this appearance. Follow-up chest x-ray is recommended after treatment of the acute pneumonia. Electronically Signed   By: Evangeline Dakin M.D.   On: 06/23/2015 20:39   US Thoracentesis Asp Pleural Space W/img Guide  06/27/2015  CLINICAL DATA:  Left pleural effusion EXAM: ULTRASOUND GUIDED left THORACENTESIS PROCEDURE: An ultrasound guided thoracentesis was thoroughly discussed with the patient and questions answered. The benefits, risks, alternatives and complications were also discussed. The patient understands and wishes to proceed with the procedure. Written consent  was obtained. Ultrasound was performed to localize and mark an adequate pocket of fluid in the left chest. The area was then prepped and draped in the normal sterile fashion. 1% Lidocaine was used for local anesthesia. Under ultrasound guidance a 6 French thoracentesis catheter was introduced. Thoracentesis was performed. The catheter was removed and a dressing applied. COMPLICATIONS: None FINDINGS: A total of approximately 50 mL of clear yellow fluid was removed. A fluid sample was sent for laboratory analysis. IMPRESSION: Successful ultrasound guided right/left thoracentesis yielding 50 mL of pleural fluid. Fluid was noted to be somewhat loculated limiting complete drainage. The amount of pleural fluid however was only of a mild degree Electronically Signed   By: Inez Catalina M.D.   On: 06/27/2015 14:14    Assessment:   Jessica Willis is a 49 y.o. female with acute illness characterized by nausea vomiting and  pleuritic chest pain with CT showing infiltrate and pleural effusion and 11/10 bcx + Strep pyogenes.  Pleural studies are pending. Clinically improving. FU BCX 11/13 negative to date. WBC remains elevated at 18. Plts are trending up - likely acute phase reactant. She remains ill still with leukocytosis and tachycardia.   DX Group A strep PNA with bacteremia  Recommendations Check HIV - discussed with patient Change ceftriaxone to PCN and IV clinda initially as this is treatment of choice If wbc remains elevated tomorrow or fluid consistent with empyema - would check CT chest to ensure no loculated effusions remain in place If improving over next 1-2 days could consider transition to oral therapy to complete 14 day course  Thank you very much for allowing me to participate in the care of this patient. Please call with questions.   Cheral Marker. Ola Spurr, MD

## 2015-06-28 LAB — CULTURE, BLOOD (ROUTINE X 2)

## 2015-06-28 LAB — PH, BODY FLUID: PH, BODY FLUID: 8

## 2015-06-28 LAB — CBC
HEMATOCRIT: 23.8 % — AB (ref 35.0–47.0)
Hemoglobin: 7.1 g/dL — ABNORMAL LOW (ref 12.0–16.0)
MCH: 19.7 pg — ABNORMAL LOW (ref 26.0–34.0)
MCHC: 29.6 g/dL — ABNORMAL LOW (ref 32.0–36.0)
MCV: 66.4 fL — ABNORMAL LOW (ref 80.0–100.0)
PLATELETS: 551 10*3/uL — AB (ref 150–440)
RBC: 3.59 MIL/uL — AB (ref 3.80–5.20)
RDW: 15.9 % — AB (ref 11.5–14.5)
WBC: 13.6 10*3/uL — AB (ref 3.6–11.0)

## 2015-06-28 MED ORDER — FERROUS SULFATE 325 (65 FE) MG PO TABS
325.0000 mg | ORAL_TABLET | Freq: Two times a day (BID) | ORAL | Status: DC
  Administered 2015-06-28 – 2015-06-30 (×5): 325 mg via ORAL
  Filled 2015-06-28 (×5): qty 1

## 2015-06-28 NOTE — Progress Notes (Signed)
[place] INFECTIOUS DISEASE PROGRESS NOTE Date of Admission:  06/23/2015     ID: Anallely Privett is a 49 y.o. female with  Grp A Strep bacteremia and PNA   Principal Problem:   Sepsis ([place]) Active Problems:   CAP (community acquired pneumonia)   Partial small bowel obstruction (HCC)   Hypokalemia   Intestinal obstruction (HCC)   Leukocytosis   Subjective: No fevers, mild cough. Feels 50% back to nml. Eating now  ROS  Eleven systems are reviewed and negative except per hpi  Medications:  Antibiotics Given (last 72 hours)    Date/Time Action Medication Dose Rate   06/25/15 1747 Given   cefTRIAXone (ROCEPHIN) 1 g in dextrose 5 % 50 mL IVPB 1 g 100 mL/hr   06/25/15 1902 Given  [No IV access at the time of administration.]   azithromycin (ZITHROMAX) 500 mg in dextrose 5 % 250 mL IVPB 500 mg 250 mL/hr   06/26/15 1755 Given   cefTRIAXone (ROCEPHIN) 1 g in dextrose 5 % 50 mL IVPB 1 g 100 mL/hr   06/27/15 1748 Given   clindamycin (CLEOCIN) IVPB 900 mg 900 mg 100 mL/hr   06/27/15 1849 Given   penicillin G potassium 4 Million Units in dextrose 5 % 250 mL IVPB 4 Million Units 250 mL/hr   06/27/15 2215 Given   penicillin G potassium 4 Million Units in dextrose 5 % 250 mL IVPB 4 Million Units 250 mL/hr   06/28/15 0121 Given   clindamycin (CLEOCIN) IVPB 900 mg 900 mg 100 mL/hr   06/28/15 0317 Given   penicillin G potassium 4 Million Units in dextrose 5 % 250 mL IVPB 4 Million Units 250 mL/hr   06/28/15 0600 Given   penicillin G potassium 4 Million Units in dextrose 5 % 250 mL IVPB 4 Million Units 250 mL/hr   06/28/15 0705 Given   clindamycin (CLEOCIN) IVPB 900 mg 900 mg 100 mL/hr   06/28/15 1253 Given   penicillin G potassium 4 Million Units in dextrose 5 % 250 mL IVPB 4 Million Units 250 mL/hr   06/28/15 1550 Given   penicillin G potassium 4 Million Units in dextrose 5 % 250 mL IVPB 4 Million Units 250 mL/hr     . clindamycin (CLEOCIN) IV  900 mg Intravenous 3 times  per day  . enoxaparin (LOVENOX) injection  40 mg Subcutaneous Q24H  . ferrous sulfate  325 mg Oral BID WC  . pencillin G potassium IV  4 Million Units Intravenous 6 times per day  . sodium chloride  3 mL Intravenous Q12H    Objective: Vital signs in last 24 hours: Temp:  [98.7 F (37.1 C)-99.1 F (37.3 C)] 98.7 F (37.1 C) (11/15 1303) Pulse Rate:  [102-106] 102 (11/15 1303) Resp:  [16-20] 20 (11/15 1303) BP: (117-133)/(72-82) 133/82 mmHg (11/15 1303) SpO2:  [94 %-96 %] 94 % (11/15 1303) Constitutional: oriented to person, place, and time. Appears dishelved  HENT: /AT, PERRLA, no scleral icterus Mouth/Throat: Oropharynx is clear and dry . No oropharyngeal exudate.  Cardiovascular: Tachy No murmur heard.  Pulmonary/Chest:rhonchi and decreased BS L base Neck = supple, no nuchal rigidity Abdominal: Soft. Bowel sounds are normal. exhibits no distension. There is no tenderness.  Lymphadenopathy: no cervical adenopathy. No axillary adenopathy Neurological: alert and oriented to person, place, and time.  Skin: Skin is warm and dry. No rash noted. No erythema.  Psychiatric: a normal mood and affect. behavior is normal.    Lab Results  Recent Labs  06/26/15 0626 06/26/15 0832 06/28/15 0800  WBC  --  18.4* 13.6*  HGB 7.3* 7.4* 7.1*  HCT  --  26.1* 23.8*  NA 142  --   --   K 3.8  --   --   CL 108  --   --   CO2 27  --   --   BUN 9  --   --   CREATININE 0.49  --   --     Microbiology: Results for orders placed or performed during the hospital encounter of 06/23/15  Culture, blood (routine x 2)     Status: None   Collection Time: 06/23/15  7:01 PM  Result Value Ref Range Status   Specimen Description BLOOD LEFT AC  Final   Special Requests   Final    BOTTLES DRAWN AEROBIC AND ANAEROBIC  AER 1CC ANA 3CC   Culture  Setup Time   Final    GRAM POSITIVE COCCI ANAEROBIC BOTTLE ONLY CRITICAL RESULT CALLED TO, READ BACK BY AND VERIFIED WITH: ALECIA BABB,RN 06/24/2015  1022 BY J RAZZAKSUAREZ,MT    Culture   Final    GROUP A STREP (S.PYOGENES) ISOLATED ANAEROBIC BOTTLE ONLY There is no known Penicillin Resistant Beta Streptococcus in the [place] For patients that are Penicillin-allergic, Erythromycin is 85-94% susceptible, and Clindamycin is 80% susceptible.  Contact Microbiology within 7 days if sensitivity testing is  required.      Report Status 06/28/2015 FINAL  Final   Organism ID, Bacteria GROUP A STREP (S.PYOGENES) ISOLATED  Final      Susceptibility   Group a strep (s.pyogenes) isolated - MIC*    VANCOMYCIN Value in next row Sensitive      SENSITIVE0.5    CLINDAMYCIN Value in next row Sensitive      SENSITIVE<=0.25    AMPICILLIN Value in next row Sensitive      SENSITIVE<=0.25    LEVOFLOXACIN Value in next row Sensitive      SENSITIVE1    LINEZOLID Value in next row Sensitive      SENSITIVE<=2    ERYTHROMYCIN Value in next row Sensitive      SENSITIVE<=0.12    * GROUP A STREP (S.PYOGENES) ISOLATED  Culture, blood (routine x 2)     Status: None   Collection Time: 06/23/15  7:52 PM  Result Value Ref Range Status   Specimen Description BLOOD RIGHT ASSIST CONTROL  Final   Special Requests BOTTLES DRAWN AEROBIC AND ANAEROBIC 5CC  Final   Culture  Setup Time   Final    GRAM POSITIVE COCCI IN CHAINS ANAEROBIC BOTTLE ONLY CRITICAL VALUE NOTED.  VALUE IS CONSISTENT WITH PREVIOUSLY REPORTED AND CALLED VALUE.    Culture   Final    GROUP A STREP (S.PYOGENES) ISOLATED ANAEROBIC BOTTLE ONLY There is no known Penicillin Resistant Beta Streptococcus in the [place] For patients that are Penicillin-allergic, Erythromycin is 85-94% susceptible, and Clindamycin is 80% susceptible.  Contact Microbiology within 7 days if sensitivity testing is  required.      Report Status 06/28/2015 FINAL  Final   Organism ID, Bacteria GROUP A STREP (S.PYOGENES) ISOLATED  Final      Susceptibility   Group a strep (s.pyogenes) isolated - MIC*    ERYTHROMYCIN Value in next  row Sensitive      SENSITIVE<=0.12    VANCOMYCIN Value in next row Sensitive      SENSITIVE0.5    CLINDAMYCIN Value in next row Sensitive      SENSITIVE<=0.25  LEVOFLOXACIN Value in next row Sensitive      SENSITIVE1    LINEZOLID Value in next row Sensitive      SENSITIVE<=2    AMPICILLIN Value in next row Sensitive      SENSITIVE<=0.25    * GROUP A STREP (S.PYOGENES) ISOLATED  Culture, blood (routine x 2)     Status: None (Preliminary result)   Collection Time: 06/26/15  8:30 AM  Result Value Ref Range Status   Specimen Description BLOOD RIGHT HAND  Final   Special Requests BOTTLES DRAWN AEROBIC AND ANAEROBIC  4CC  Final   Culture NO GROWTH 2 DAYS  Final   Report Status PENDING  Incomplete  Culture, blood (routine x 2)     Status: None (Preliminary result)   Collection Time: 06/26/15  8:31 AM  Result Value Ref Range Status   Specimen Description BLOOD RIGHT HAND  Final   Special Requests   Final    BOTTLES DRAWN AEROBIC AND ANAEROBIC  AER 3CC ANA 6CC   Culture NO GROWTH 2 DAYS  Final   Report Status PENDING  Incomplete  Body fluid culture     Status: None (Preliminary result)   Collection Time: 06/27/15 12:20 PM  Result Value Ref Range Status   Specimen Description PLEURAL  Final   Special Requests NONE  Final   Gram Stain FEW WBC SEEN NO ORGANISMS SEEN   Final   Culture NO GROWTH < 24 HOURS  Final   Report Status PENDING  Incomplete    Studies/Results: Dg Chest 2 View  06/27/2015  CLINICAL DATA:  Status post left thoracentesis. EXAM: CHEST  2 VIEW COMPARISON:  06/23/2015 FINDINGS: On the frontal view, there still irregular interstitial and hazy airspace opacities in the central aspect of the lung and more confluently at the left lung base. This does not appear significantly changed following thoracentesis. On the lateral view some fluid accumulates within the upper oblique fissure. There is little definite residual fluid on the left. Remaining left lung opacity appears  to be atelectasis, pneumonia or a combination. There is no pneumothorax.  Right lung is essentially clear. IMPRESSION: 1. No evidence of a pneumothorax following thoracentesis. 2. There is significant residual left lung opacity, most evident in the left lower lobe and left upper lobe lingula, consistent with atelectasis, pneumonia or a combination. Electronically Signed   By: Lajean Manes M.D.   On: 06/27/2015 12:48   US Thoracentesis Asp Pleural Space W/img Guide  06/27/2015  CLINICAL DATA:  Left pleural effusion EXAM: ULTRASOUND GUIDED left THORACENTESIS PROCEDURE: An ultrasound guided thoracentesis was thoroughly discussed with the patient and questions answered. The benefits, risks, alternatives and complications were also discussed. The patient understands and wishes to proceed with the procedure. Written consent was obtained. Ultrasound was performed to localize and mark an adequate pocket of fluid in the left chest. The area was then prepped and draped in the normal sterile fashion. 1% Lidocaine was used for local anesthesia. Under ultrasound guidance a 6 French thoracentesis catheter was introduced. Thoracentesis was performed. The catheter was removed and a dressing applied. COMPLICATIONS: None FINDINGS: A total of approximately 50 mL of clear yellow fluid was removed. A fluid sample was sent for laboratory analysis. IMPRESSION: Successful ultrasound guided right/left thoracentesis yielding 50 mL of pleural fluid. Fluid was noted to be somewhat loculated limiting complete drainage. The amount of pleural fluid however was only of a mild degree Electronically Signed   By: Inez Catalina M.D.   On:  06/27/2015 14:14    Assessment/Plan: Kadedra Sciarra is a 49 y.o. female with acute illness characterized by nausea vomiting and pleuritic chest pain with CT showing infiltrate and pleural effusion and 11/10 bcx + Strep pyogenes. Pleural studies are neg to date. Clinically improving. FU BCX 11/13  negative to date. WBC down to 13. . Plts are trending up - likely acute phase reactant.  Reports 50% back to nml. HIV negative  DX  Group A strep PNA with bacteremia Suspected parapneumonic effusion - s/p thoracentesis   Recommendations Cont PCN and IV clinda initially as this is treatment of choice Recheck WBC and CXR in AM -  wbc remains elevated tomorrow< CXR shows residual fluid or is she worsens clinically  would check CT chest to ensure no loculated effusions remain in place If improving other wise tomorrow would transition to oral therapy with PCN 500 mg po qid for 14 day total course - will need to be seen at end of course to repeat CXR to ensure resolution Thank you very much for the consult. Will follow with you.  [place], [place]   06/28/2015, 4:08 PM

## 2015-06-28 NOTE — Progress Notes (Signed)
Patient ID: Jessica Willis, female   DOB: [birth year], 49 y.o.   MRN: [id number] [facility] PROGRESS NOTE  PCP: No PCP Per Patient  HPI/Subjective: Patient is hungry and wants to eat. Main complaint that she sweating a lot. Not much cough. No shortness of breath.  Objective: Filed Vitals:   06/28/15 0523  BP: 121/72  Pulse: 105  Temp: 98.8 F (37.1 C)  Resp: 16    Filed Weights   06/23/15 2343  Weight: 80.604 kg (177 lb 11.2 oz)    ROS: Review of Systems  Constitutional: Positive for diaphoresis. Negative for fever and chills.  Eyes: Negative for blurred vision.  Respiratory: Negative for cough and shortness of breath.   Cardiovascular: Negative for chest pain.  Gastrointestinal: Negative for nausea, vomiting, abdominal pain, diarrhea and constipation.  Genitourinary: Negative for dysuria.  Musculoskeletal: Negative for joint pain.  Neurological: Negative for dizziness and headaches.   Exam: Physical Exam  Constitutional: She is oriented to person, place, and time.  HENT:  Nose: No mucosal edema.  Mouth/Throat: No oropharyngeal exudate or posterior oropharyngeal edema.  Eyes: Conjunctivae, EOM and lids are normal. Pupils are equal, round, and reactive to light.  Neck: No JVD present. Carotid bruit is not present. No edema present. No thyroid mass and no thyromegaly present.  Cardiovascular: S1 normal and S2 normal.  Exam reveals no gallop.   No murmur heard. Pulses:      Dorsalis pedis pulses are 2+ on the right side, and 2+ on the left side.  Respiratory: No respiratory distress. She has decreased breath sounds in the right lower field and the left lower field. She has no wheezes. She has rhonchi in the right lower field and the left lower field. She has no rales.  GI: Soft. She exhibits distension. Bowel sounds are decreased. There is no tenderness.  Musculoskeletal:       Right ankle: She exhibits no swelling.       Left ankle: She exhibits no  swelling.  Lymphadenopathy:    She has no cervical adenopathy.  Neurological: She is alert and oriented to person, place, and time. No cranial nerve deficit.  Skin: Skin is warm. No rash noted. Nails show no clubbing.  Psychiatric: She has a normal mood and affect.    Data Reviewed: Basic Metabolic Panel:  Recent Labs Lab 06/23/15 1822 06/24/15 0411 06/24/15 1609 06/25/15 0708 06/26/15 0626  NA 139 139  --  141 142  K 2.7* 2.5* 3.1* 2.7* 3.8  CL 100* 100*  --  103 108  CO2 28 30  --  29 27  GLUCOSE 106* 118*  --  103* 113*  BUN 17 16  --  15 9  CREATININE 0.65 0.55  --  0.53 0.49  CALCIUM 8.4* 7.8*  --  7.9* 7.9*  MG 2.4  --  2.5*  --   --    Liver Function Tests:  Recent Labs Lab 06/23/15 1822  AST 21  ALT 10*  ALKPHOS 88  BILITOT 0.8  PROT 7.7  ALBUMIN 2.6*   CBC:  Recent Labs Lab 06/23/15 1822 06/24/15 0411 06/25/15 0708 06/26/15 0626 06/26/15 0832 06/28/15 0800  WBC 23.7* 15.5* 17.5*  --  18.4* 13.6*  NEUTROABS 22.2*  --  15.9*  --   --   --   HGB 8.3* 7.5* 7.8* 7.3* 7.4* 7.1*  HCT 27.6* 24.0* 26.0*  --  26.1* 23.8*  MCV 66.3* 66.3* 66.5*  --  67.5* 66.4*  PLT  434 353 453*  --  507* 551*    Recent Results (from the past 240 hour(s))  Culture, blood (routine x 2)     Status: None   Collection Time: 06/23/15  7:01 PM  Result Value Ref Range Status   Specimen Description BLOOD LEFT AC  Final   Special Requests   Final    BOTTLES DRAWN AEROBIC AND ANAEROBIC  AER 1CC ANA 3CC   Culture  Setup Time   Final    GRAM POSITIVE COCCI ANAEROBIC BOTTLE ONLY CRITICAL RESULT CALLED TO, READ BACK BY AND VERIFIED WITH: ALECIA BABB,RN 06/24/2015 1022 BY J RAZZAKSUAREZ,MT    Culture   Final    GROUP A STREP (S.PYOGENES) ISOLATED ANAEROBIC BOTTLE ONLY There is no known Penicillin Resistant Beta Streptococcus in the [place] For patients that are Penicillin-allergic, Erythromycin is 85-94% susceptible, and Clindamycin is 80% susceptible.  Contact Microbiology within  7 days if sensitivity testing is  required.      Report Status 06/28/2015 FINAL  Final   Organism ID, Bacteria GROUP A STREP (S.PYOGENES) ISOLATED  Final      Susceptibility   Group a strep (s.pyogenes) isolated - MIC*    VANCOMYCIN Value in next row Sensitive      SENSITIVE0.5    CLINDAMYCIN Value in next row Sensitive      SENSITIVE<=0.25    AMPICILLIN Value in next row Sensitive      SENSITIVE<=0.25    LEVOFLOXACIN Value in next row Sensitive      SENSITIVE1    LINEZOLID Value in next row Sensitive      SENSITIVE<=2    ERYTHROMYCIN Value in next row Sensitive      SENSITIVE<=0.12    * GROUP A STREP (S.PYOGENES) ISOLATED  Culture, blood (routine x 2)     Status: None   Collection Time: 06/23/15  7:52 PM  Result Value Ref Range Status   Specimen Description BLOOD RIGHT ASSIST CONTROL  Final   Special Requests BOTTLES DRAWN AEROBIC AND ANAEROBIC 5CC  Final   Culture  Setup Time   Final    GRAM POSITIVE COCCI IN CHAINS ANAEROBIC BOTTLE ONLY CRITICAL VALUE NOTED.  VALUE IS CONSISTENT WITH PREVIOUSLY REPORTED AND CALLED VALUE.    Culture   Final    GROUP A STREP (S.PYOGENES) ISOLATED ANAEROBIC BOTTLE ONLY There is no known Penicillin Resistant Beta Streptococcus in the [place] For patients that are Penicillin-allergic, Erythromycin is 85-94% susceptible, and Clindamycin is 80% susceptible.  Contact Microbiology within 7 days if sensitivity testing is  required.      Report Status 06/28/2015 FINAL  Final   Organism ID, Bacteria GROUP A STREP (S.PYOGENES) ISOLATED  Final      Susceptibility   Group a strep (s.pyogenes) isolated - MIC*    ERYTHROMYCIN Value in next row Sensitive      SENSITIVE<=0.12    VANCOMYCIN Value in next row Sensitive      SENSITIVE0.5    CLINDAMYCIN Value in next row Sensitive      SENSITIVE<=0.25    LEVOFLOXACIN Value in next row Sensitive      SENSITIVE1    LINEZOLID Value in next row Sensitive      SENSITIVE<=2    AMPICILLIN Value in next row  Sensitive      SENSITIVE<=0.25    * GROUP A STREP (S.PYOGENES) ISOLATED  Culture, blood (routine x 2)     Status: None (Preliminary result)   Collection Time: 06/26/15  8:30 AM  Result Value  Ref Range Status   Specimen Description BLOOD RIGHT HAND  Final   Special Requests BOTTLES DRAWN AEROBIC AND ANAEROBIC  4CC  Final   Culture NO GROWTH 1 DAY  Final   Report Status PENDING  Incomplete  Culture, blood (routine x 2)     Status: None (Preliminary result)   Collection Time: 06/26/15  8:31 AM  Result Value Ref Range Status   Specimen Description BLOOD RIGHT HAND  Final   Special Requests   Final    BOTTLES DRAWN AEROBIC AND ANAEROBIC  AER 3CC ANA 6CC   Culture NO GROWTH 1 DAY  Final   Report Status PENDING  Incomplete     Studies: Dg Chest 2 View  06/27/2015  CLINICAL DATA:  Status post left thoracentesis. EXAM: CHEST  2 VIEW COMPARISON:  06/23/2015 FINDINGS: On the frontal view, there still irregular interstitial and hazy airspace opacities in the central aspect of the lung and more confluently at the left lung base. This does not appear significantly changed following thoracentesis. On the lateral view some fluid accumulates within the upper oblique fissure. There is little definite residual fluid on the left. Remaining left lung opacity appears to be atelectasis, pneumonia or a combination. There is no pneumothorax.  Right lung is essentially clear. IMPRESSION: 1. No evidence of a pneumothorax following thoracentesis. 2. There is significant residual left lung opacity, most evident in the left lower lobe and left upper lobe lingula, consistent with atelectasis, pneumonia or a combination. Electronically Signed   By: Lajean Manes M.D.   On: 06/27/2015 12:48   Dg Abd 2 Views  06/26/2015  CLINICAL DATA:  49 year old female with abdominal pain and obstruction. EXAM: ABDOMEN - 2 VIEW COMPARISON:  06/25/2015 CT and radiographs FINDINGS: Dilated small bowel loops are identified, slightly  improved. Oral contrast from recent CT has now progressed into the colon. There is no evidence of pneumoperitoneum. Left basilar consolidation/airspace disease and loculated left effusion again identified. IMPRESSION: Slightly decreased small bowel distension with progression of oral contrast (from recent CT) into the colon. No evidence of pneumoperitoneum. Continued left lower lung consolidation/airspace disease and loculated left effusion. Electronically Signed   By: Margarette Canada M.D.   On: 06/26/2015 11:26   US Thoracentesis Asp Pleural Space W/img Guide  06/27/2015  CLINICAL DATA:  Left pleural effusion EXAM: ULTRASOUND GUIDED left THORACENTESIS PROCEDURE: An ultrasound guided thoracentesis was thoroughly discussed with the patient and questions answered. The benefits, risks, alternatives and complications were also discussed. The patient understands and wishes to proceed with the procedure. Written consent was obtained. Ultrasound was performed to localize and mark an adequate pocket of fluid in the left chest. The area was then prepped and draped in the normal sterile fashion. 1% Lidocaine was used for local anesthesia. Under ultrasound guidance a 6 French thoracentesis catheter was introduced. Thoracentesis was performed. The catheter was removed and a dressing applied. COMPLICATIONS: None FINDINGS: A total of approximately 50 mL of clear yellow fluid was removed. A fluid sample was sent for laboratory analysis. IMPRESSION: Successful ultrasound guided right/left thoracentesis yielding 50 mL of pleural fluid. Fluid was noted to be somewhat loculated limiting complete drainage. The amount of pleural fluid however was only of a mild degree Electronically Signed   By: Inez Catalina M.D.   On: 06/27/2015 14:14    Scheduled Meds: . clindamycin (CLEOCIN) IV  900 mg Intravenous 3 times per day  . enoxaparin (LOVENOX) injection  40 mg Subcutaneous Q24H  . ferrous sulfate  325 mg Oral BID WC  . pantoprazole  (PROTONIX) IV  40 mg Intravenous BID  . pencillin G potassium IV  4 Million Units Intravenous 6 times per day  . sodium chloride  3 mL Intravenous Q12H   Continuous Infusions:    Assessment/Plan:  1. Sepsis with group A  Streptococcus Pyogenes. Pneumonia with loculated pleural effusion. Status post thoracentesis yesterday of 50 cc of fluid. Culture and pH still pending at this time. Appreciate an infectious disease consultation and antibiotics were changed to penicillin G and clindamycin. White blood cell count trending better. 2. Partial small bowel obstruction. CT scan showing numerous small bowel dilation. Advance diet to regular since she had a bowel movement yesterday. 3. Severe hypokalemia-improved 4. Microcytic anemia, iron and TIBC low. I will start iron today. Hemoglobin today 7.1.   Code Status:     Code Status Orders        Start     Ordered   06/23/15 2342  Full code   Continuous     06/23/15 2341     Disposition Plan: Hopefully home soon  Antibiotics:  Clindamycin  Penicillin G  Time spent: 20 minutes  Loletha Grayer  [facility]

## 2015-06-28 NOTE — Progress Notes (Signed)
Nutrition Follow-up     INTERVENTION:  Meals and snacks: Cater to pt preferences Medical Nutrition Supplement Therapy: Will hold off on adding supplement as intake improving   NUTRITION DIAGNOSIS:   Inadequate oral intake related to acute illness as evidenced by per patient/family report, intake improving    GOAL:   Patient will meet greater than or equal to 90% of their needs  Progressing towards meeting goals  MONITOR:    (Energy Intake, Electrolyte and renal Profile, Digestive system, Anthropometrics)  REASON FOR ASSESSMENT:   Malnutrition Screening Tool    ASSESSMENT:   Pt admitted with pna with sepsis, possible aspiration pna secondary to vomitting. Per MD note, pt with n/v for one day prior to Monday of this week where upon pt has not had flatus or BM since.    Current Nutrition: tolerated breakfast well this am per pt eating 100%.  Pt reports intake has improved since admission   Gastrointestinal Profile: Last BM: 11/13   Scheduled Medications:  . clindamycin (CLEOCIN) IV  900 mg Intravenous 3 times per day  . enoxaparin (LOVENOX) injection  40 mg Subcutaneous Q24H  . ferrous sulfate  325 mg Oral BID WC  . pantoprazole (PROTONIX) IV  40 mg Intravenous BID  . pencillin G potassium IV  4 Million Units Intravenous 6 times per day  . sodium chloride  3 mL Intravenous Q12H       Electrolyte/Renal Profile and Glucose Profile:   Recent Labs Lab 06/23/15 1822 06/24/15 0411 06/24/15 1609 06/25/15 0708 06/26/15 0626  NA 139 139  --  141 142  K 2.7* 2.5* 3.1* 2.7* 3.8  CL 100* 100*  --  103 108  CO2 28 30  --  29 27  BUN 17 16  --  15 9  CREATININE 0.65 0.55  --  0.53 0.49  CALCIUM 8.4* 7.8*  --  7.9* 7.9*  MG 2.4  --  2.5*  --   --   GLUCOSE 106* 118*  --  103* 113*   Protein Profile:   Recent Labs Lab 06/23/15 1822  ALBUMIN 2.6*      Weight Trend since Admission: Filed Weights   06/23/15 2343  Weight: 177 lb 11.2 oz (80.604 kg)       Diet Order:  Diet regular Room service appropriate?: Yes; Fluid consistency:: Thin  Skin:  Reviewed, no issues   Height:   Ht Readings from Last 1 Encounters:  06/23/15 5\' 9"  (1.753 m)    Weight:   Wt Readings from Last 1 Encounters:  06/23/15 177 lb 11.2 oz (80.604 kg)    Ideal Body Weight:     BMI:  Body mass index is 26.23 kg/(m^2).  Estimated Nutritional Needs:   Kcal:  BEE: 1490kcals, TEE: (IF 1.1-1.3)(AF 1.2) [id number]  Protein:  65-80g protein (0.8-1.0g/kg)  Fluid:  2015-2438mL of fluid (25-67mL/kg)  EDUCATION NEEDS:   No education needs identified at this time  LOW Care Level  Kannon Baum B. Zenia Resides, [place], [place] (pager)

## 2015-06-29 ENCOUNTER — Inpatient Hospital Stay: Payer: Self-pay

## 2015-06-29 LAB — CBC
HEMATOCRIT: 23.2 % — AB (ref 35.0–47.0)
HEMOGLOBIN: 7.1 g/dL — AB (ref 12.0–16.0)
MCH: 20.4 pg — ABNORMAL LOW (ref 26.0–34.0)
MCHC: 30.7 g/dL — AB (ref 32.0–36.0)
MCV: 66.5 fL — AB (ref 80.0–100.0)
Platelets: 569 10*3/uL — ABNORMAL HIGH (ref 150–440)
RBC: 3.49 MIL/uL — ABNORMAL LOW (ref 3.80–5.20)
RDW: 15.5 % — AB (ref 11.5–14.5)
WBC: 12.4 10*3/uL — AB (ref 3.6–11.0)

## 2015-06-29 LAB — CYTOLOGY - NON PAP

## 2015-06-29 IMAGING — CR DG CHEST 2V
1 series · 2 of 2 positions shown · non-contrast
Comparison: [DATE]

CLINICAL DATA: Sepsis. Fever. Chills. Recent admission for
pneumonia.

EXAM:
CHEST  2 VIEW

[Series 1: w chest pa · 0.14mm/px · 2 of 2 slices shown]
[im 1/2]
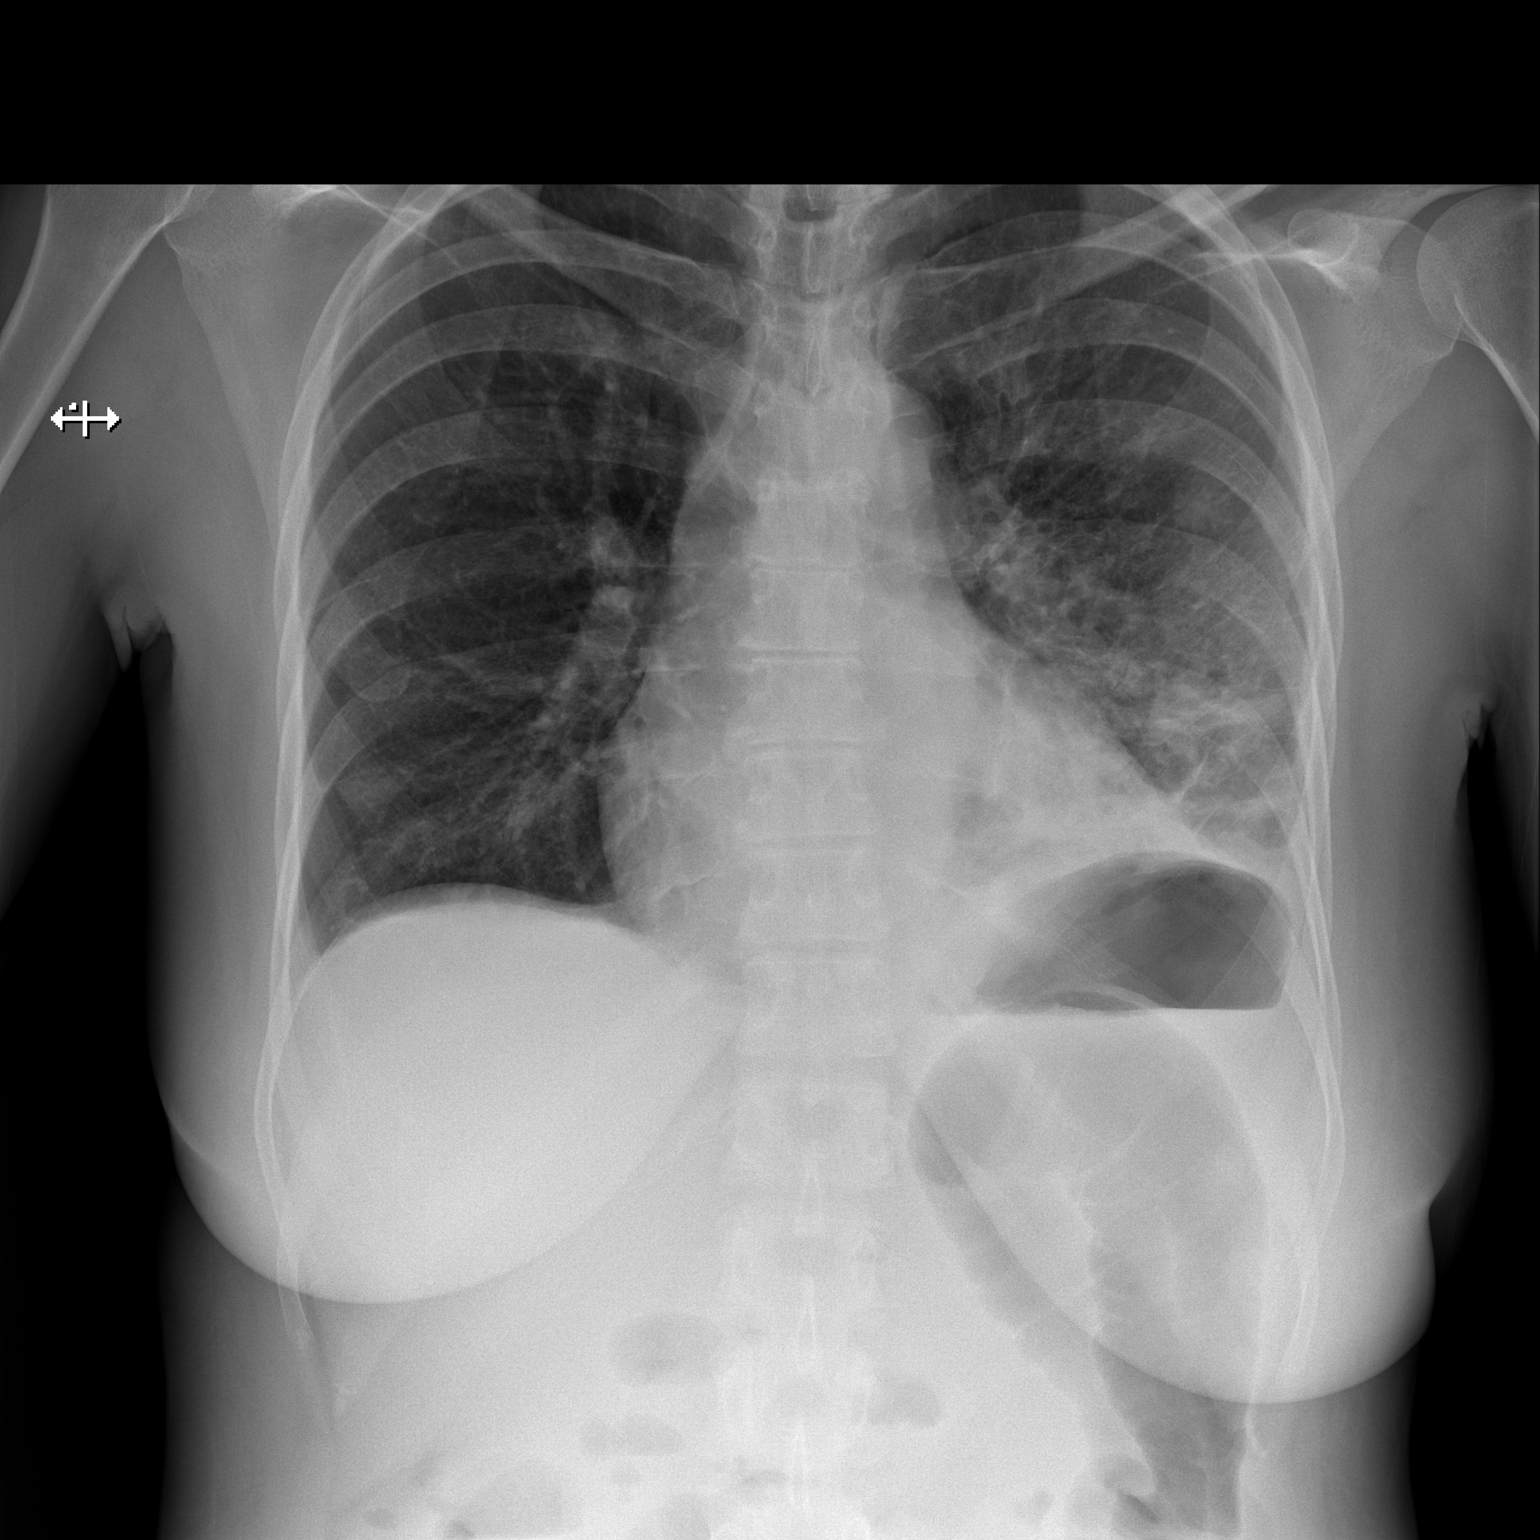
[im 2/2]
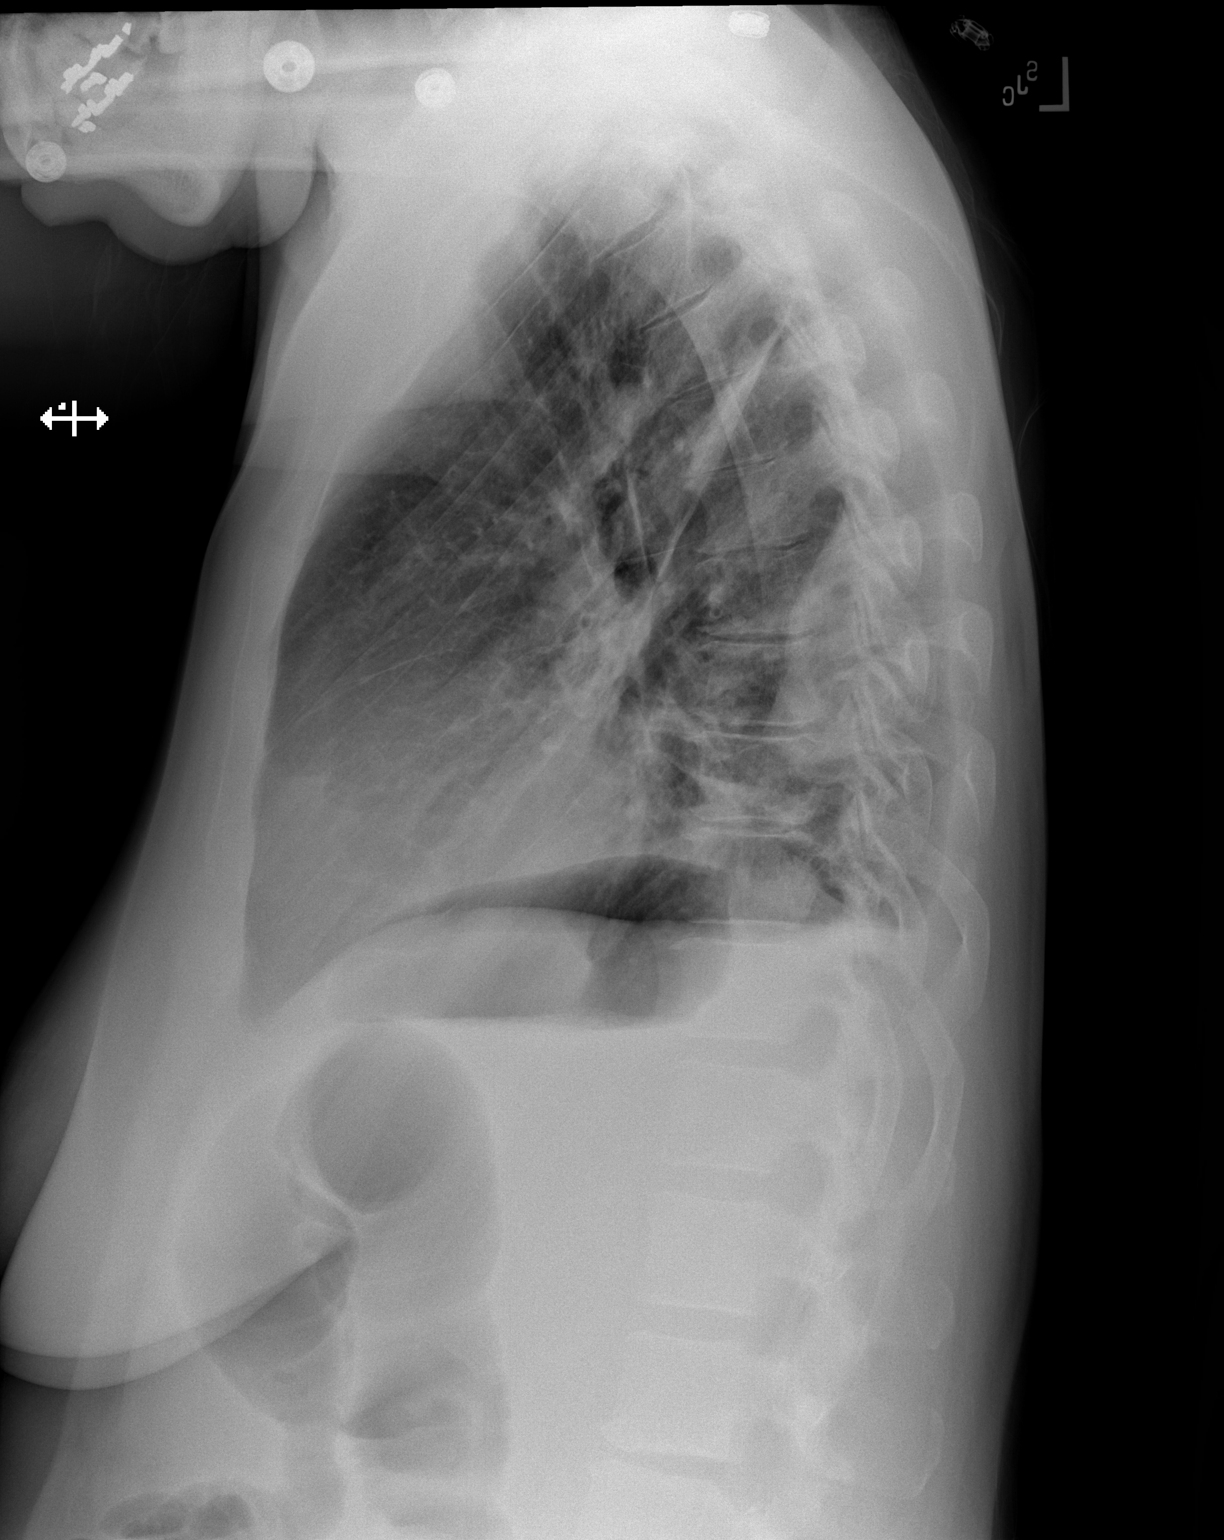

[2 of 2 positions shown; findings below may reference images not displayed]

FINDINGS: Midline trachea. Normal heart size. Small left pleural effusion
again identified. There may be developing loculation posteriorly. No
pneumothorax. Left lower and inferior left upper lobe airspace
disease is similar. Clear right lung.
IMPRESSION: Similar left-sided airspace disease, likely pneumonia. Followup PA
and lateral chest X-ray is recommended in 3-4 weeks following trial
of antibiotic therapy to ensure resolution and exclude underlying
malignancy.

Small left pleural effusion persists. Possible developing loculation
posteriorly. Recommend attention on follow-up.

## 2015-06-29 NOTE — Progress Notes (Signed)
[facility] PROGRESS NOTE Date of Admission:  06/23/2015     ID: Jessica Willis is a 49 y.o. female with  Grp A Strep bacteremia and PNA   Principal Problem:   Sepsis ([place]) Active Problems:   CAP (community acquired pneumonia)   Partial small bowel obstruction (HCC)   Hypokalemia   Intestinal obstruction (HCC)   Leukocytosis   Subjective: No fevers- wbc down to 12, mild cough.   ROS  Eleven systems are reviewed and negative except per hpi  Medications:  Antibiotics Given (last 72 hours)    Date/Time Action Medication Dose Rate   06/26/15 1755 Given   cefTRIAXone (ROCEPHIN) 1 g in dextrose 5 % 50 mL IVPB 1 g 100 mL/hr   06/27/15 1748 Given   clindamycin (CLEOCIN) IVPB 900 mg 900 mg 100 mL/hr   06/27/15 1849 Given   penicillin G potassium 4 Million Units in dextrose 5 % 250 mL IVPB 4 Million Units 250 mL/hr   06/27/15 2215 Given   penicillin G potassium 4 Million Units in dextrose 5 % 250 mL IVPB 4 Million Units 250 mL/hr   06/28/15 0121 Given   clindamycin (CLEOCIN) IVPB 900 mg 900 mg 100 mL/hr   06/28/15 0317 Given   penicillin G potassium 4 Million Units in dextrose 5 % 250 mL IVPB 4 Million Units 250 mL/hr   06/28/15 0600 Given   penicillin G potassium 4 Million Units in dextrose 5 % 250 mL IVPB 4 Million Units 250 mL/hr   06/28/15 0705 Given   clindamycin (CLEOCIN) IVPB 900 mg 900 mg 100 mL/hr   06/28/15 1253 Given   penicillin G potassium 4 Million Units in dextrose 5 % 250 mL IVPB 4 Million Units 250 mL/hr   06/28/15 1550 Given   penicillin G potassium 4 Million Units in dextrose 5 % 250 mL IVPB 4 Million Units 250 mL/hr   06/28/15 1708 Given   clindamycin (CLEOCIN) IVPB 900 mg 900 mg 100 mL/hr   06/28/15 1913 Given   penicillin G potassium 4 Million Units in dextrose 5 % 250 mL IVPB 4 Million Units 250 mL/hr   06/28/15 2303 Given   clindamycin (CLEOCIN) IVPB 900 mg 900 mg 100 mL/hr   06/28/15 2341 Given   penicillin G potassium 4  Million Units in dextrose 5 % 250 mL IVPB 4 Million Units 250 mL/hr   06/29/15 0328 Given   penicillin G potassium 4 Million Units in dextrose 5 % 250 mL IVPB 4 Million Units 250 mL/hr   06/29/15 0526 Given   clindamycin (CLEOCIN) IVPB 900 mg 900 mg 100 mL/hr   06/29/15 0603 Given   penicillin G potassium 4 Million Units in dextrose 5 % 250 mL IVPB 4 Million Units 250 mL/hr   06/29/15 1135 Given   penicillin G potassium 4 Million Units in dextrose 5 % 250 mL IVPB 4 Million Units 250 mL/hr   06/29/15 1327 Given   clindamycin (CLEOCIN) IVPB 900 mg 900 mg 100 mL/hr   06/29/15 1506 Given   penicillin G potassium 4 Million Units in dextrose 5 % 250 mL IVPB 4 Million Units 250 mL/hr     . clindamycin (CLEOCIN) IV  900 mg Intravenous 3 times per day  . ferrous sulfate  325 mg Oral BID WC  . pencillin G potassium IV  4 Million Units Intravenous 6 times per day  . sodium chloride  3 mL Intravenous Q12H    Objective: Vital signs in last 24 hours:  Temp:  [98.4 F (36.9 C)] 98.4 F (36.9 C) (11/16 0526) Pulse Rate:  [101-104] 104 (11/16 1356) Resp:  [17-20] 17 (11/16 1356) BP: (118-147)/(75-83) 134/75 mmHg (11/16 1356) SpO2:  [94 %-100 %] 100 % (11/16 1356) Constitutional: oriented to person, place, and time. Appears dishelved  HENT: [place]/AT, PERRLA, no scleral icterus Mouth/Throat: Oropharynx is clear and dry . No oropharyngeal exudate.  Cardiovascular: Tachy No murmur heard.  Pulmonary/Chest:rhonchi and decreased BS L base Neck = supple, no nuchal rigidity Abdominal: Soft. Bowel sounds are normal. exhibits no distension. There is no tenderness.  Lymphadenopathy: no cervical adenopathy. No axillary adenopathy Neurological: alert and oriented to person, place, and time.  Skin: Skin is warm and dry. No rash noted. No erythema.  Psychiatric: a normal mood and affect. behavior is normal.    Lab Results  Recent Labs  06/28/15 0800 06/29/15 0439  WBC 13.6* 12.4*  HGB 7.1* 7.1*   HCT 23.8* 23.2*    Microbiology: Results for orders placed or performed during the hospital encounter of 06/23/15  Culture, blood (routine x 2)     Status: None   Collection Time: 06/23/15  7:01 PM  Result Value Ref Range Status   Specimen Description BLOOD LEFT AC  Final   Special Requests   Final    BOTTLES DRAWN AEROBIC AND ANAEROBIC  AER 1CC ANA 3CC   Culture  Setup Time   Final    GRAM POSITIVE COCCI ANAEROBIC BOTTLE ONLY CRITICAL RESULT CALLED TO, READ BACK BY AND VERIFIED WITH: ALECIA BABB,RN 06/24/2015 1022 BY J RAZZAKSUAREZ,MT    Culture   Final    GROUP A STREP (S.PYOGENES) ISOLATED ANAEROBIC BOTTLE ONLY There is no known Penicillin Resistant Beta Streptococcus in the [place] For patients that are Penicillin-allergic, Erythromycin is 85-94% susceptible, and Clindamycin is 80% susceptible.  Contact Microbiology within 7 days if sensitivity testing is  required.      Report Status 06/28/2015 FINAL  Final   Organism ID, Bacteria GROUP A STREP (S.PYOGENES) ISOLATED  Final      Susceptibility   Group a strep (s.pyogenes) isolated - MIC*    VANCOMYCIN Value in next row Sensitive      SENSITIVE0.5    CLINDAMYCIN Value in next row Sensitive      SENSITIVE<=0.25    AMPICILLIN Value in next row Sensitive      SENSITIVE<=0.25    LEVOFLOXACIN Value in next row Sensitive      SENSITIVE1    LINEZOLID Value in next row Sensitive      SENSITIVE<=2    ERYTHROMYCIN Value in next row Sensitive      SENSITIVE<=0.12    * GROUP A STREP (S.PYOGENES) ISOLATED  Culture, blood (routine x 2)     Status: None   Collection Time: 06/23/15  7:52 PM  Result Value Ref Range Status   Specimen Description BLOOD RIGHT ASSIST CONTROL  Final   Special Requests BOTTLES DRAWN AEROBIC AND ANAEROBIC 5CC  Final   Culture  Setup Time   Final    GRAM POSITIVE COCCI IN CHAINS ANAEROBIC BOTTLE ONLY CRITICAL VALUE NOTED.  VALUE IS CONSISTENT WITH PREVIOUSLY REPORTED AND CALLED VALUE.    Culture   Final     GROUP A STREP (S.PYOGENES) ISOLATED ANAEROBIC BOTTLE ONLY There is no known Penicillin Resistant Beta Streptococcus in the [place] For patients that are Penicillin-allergic, Erythromycin is 85-94% susceptible, and Clindamycin is 80% susceptible.  Contact Microbiology within 7 days if sensitivity testing is  required.  Report Status 06/28/2015 FINAL  Final   Organism ID, Bacteria GROUP A STREP (S.PYOGENES) ISOLATED  Final      Susceptibility   Group a strep (s.pyogenes) isolated - MIC*    ERYTHROMYCIN Value in next row Sensitive      SENSITIVE<=0.12    VANCOMYCIN Value in next row Sensitive      SENSITIVE0.5    CLINDAMYCIN Value in next row Sensitive      SENSITIVE<=0.25    LEVOFLOXACIN Value in next row Sensitive      SENSITIVE1    LINEZOLID Value in next row Sensitive      SENSITIVE<=2    AMPICILLIN Value in next row Sensitive      SENSITIVE<=0.25    * GROUP A STREP (S.PYOGENES) ISOLATED  Culture, blood (routine x 2)     Status: None (Preliminary result)   Collection Time: 06/26/15  8:30 AM  Result Value Ref Range Status   Specimen Description BLOOD RIGHT HAND  Final   Special Requests BOTTLES DRAWN AEROBIC AND ANAEROBIC  4CC  Final   Culture NO GROWTH 3 DAYS  Final   Report Status PENDING  Incomplete  Culture, blood (routine x 2)     Status: None (Preliminary result)   Collection Time: 06/26/15  8:31 AM  Result Value Ref Range Status   Specimen Description BLOOD RIGHT HAND  Final   Special Requests   Final    BOTTLES DRAWN AEROBIC AND ANAEROBIC  AER 3CC ANA 6CC   Culture NO GROWTH 3 DAYS  Final   Report Status PENDING  Incomplete  Body fluid culture     Status: None (Preliminary result)   Collection Time: 06/27/15 12:20 PM  Result Value Ref Range Status   Specimen Description PLEURAL  Final   Special Requests NONE  Final   Gram Stain FEW WBC SEEN NO ORGANISMS SEEN   Final   Culture NO GROWTH 2 DAYS  Final   Report Status PENDING  Incomplete    Studies/Results: No  results found.  Assessment/Plan: Jessica Willis is a 49 y.o. female with acute illness characterized by nausea vomiting and pleuritic chest pain with CT showing infiltrate and pleural effusion and 11/10 bcx + Strep pyogenes. Pleural studies are neg to date. Clinically improving. FU BCX 11/13 negative to date. WBC down to 13. . Plts are trending up - likely acute phase reactant.  Reports 50% back to nml. HIV negative  DX  Group A strep PNA with bacteremia Suspected parapneumonic effusion - s/p thoracentesis  Recommendations Cont PCN and IV clinda initially as this is treatment of choice. Recheck  CXR CXR today  - if CXR shows minimal improvement would check  CT chest to ensure no loculated effusions remain in place If improving other wise tomorrow would transition to oral therapy with PCN 500 mg po qid for 14 day total course - will need to be seen at end of course to repeat CXR to ensure resolution Thank you very much for the consult. Will follow with you.  [place], [place]   06/29/2015, 3:38 PM

## 2015-06-29 NOTE — Progress Notes (Signed)
Patient ID: Jessica Willis, female   DOB: November 26, 1965, 49 y.o.   MRN: [id number] [facility] PROGRESS NOTE  PCP: No PCP Per Patient  HPI/Subjective: Patient still sweating but unclear if she is having hot flashes are not. Not really complaining of any chest pain or any shortness of breath or even cough. She is tolerating her diet and having bowel movements.  Objective: Filed Vitals:   06/29/15 1356  BP: 134/75  Pulse: 104  Temp:   Resp: 17    Filed Weights   06/23/15 2343  Weight: 80.604 kg (177 lb 11.2 oz)    ROS: Review of Systems  Constitutional: Positive for diaphoresis. Negative for fever and chills.  Eyes: Negative for blurred vision.  Respiratory: Negative for cough and shortness of breath.   Cardiovascular: Negative for chest pain.  Gastrointestinal: Negative for nausea, vomiting, abdominal pain, diarrhea and constipation.  Genitourinary: Negative for dysuria.  Musculoskeletal: Negative for joint pain.  Neurological: Negative for dizziness and headaches.   Exam: Physical Exam  Constitutional: She is oriented to person, place, and time.  HENT:  Nose: No mucosal edema.  Mouth/Throat: No oropharyngeal exudate or posterior oropharyngeal edema.  Eyes: Conjunctivae, EOM and lids are normal. Pupils are equal, round, and reactive to light.  Neck: No JVD present. Carotid bruit is not present. No edema present. No thyroid mass and no thyromegaly present.  Cardiovascular: S1 normal and S2 normal.  Exam reveals no gallop.   No murmur heard. Pulses:      Dorsalis pedis pulses are 2+ on the right side, and 2+ on the left side.  Respiratory: No respiratory distress. She has decreased breath sounds in the right lower field and the left lower field. She has no wheezes. She has rhonchi in the right lower field and the left lower field. She has no rales.  GI: Soft. She exhibits distension. Bowel sounds are decreased. There is no tenderness.  Musculoskeletal:    Right ankle: She exhibits no swelling.       Left ankle: She exhibits no swelling.  Lymphadenopathy:    She has no cervical adenopathy.  Neurological: She is alert and oriented to person, place, and time. No cranial nerve deficit.  Skin: Skin is warm. No rash noted. Nails show no clubbing.  Psychiatric: She has a normal mood and affect.    Data Reviewed: Basic Metabolic Panel:  Recent Labs Lab 06/23/15 1822 06/24/15 0411 06/24/15 1609 06/25/15 0708 06/26/15 0626  NA 139 139  --  141 142  K 2.7* 2.5* 3.1* 2.7* 3.8  CL 100* 100*  --  103 108  CO2 28 30  --  29 27  GLUCOSE 106* 118*  --  103* 113*  BUN 17 16  --  15 9  CREATININE 0.65 0.55  --  0.53 0.49  CALCIUM 8.4* 7.8*  --  7.9* 7.9*  MG 2.4  --  2.5*  --   --    Liver Function Tests:  Recent Labs Lab 06/23/15 1822  AST 21  ALT 10*  ALKPHOS 88  BILITOT 0.8  PROT 7.7  ALBUMIN 2.6*   CBC:  Recent Labs Lab 06/23/15 1822 06/24/15 0411 06/25/15 0708 06/26/15 0626 06/26/15 0832 06/28/15 0800 06/29/15 0439  WBC 23.7* 15.5* 17.5*  --  18.4* 13.6* 12.4*  NEUTROABS 22.2*  --  15.9*  --   --   --   --   HGB 8.3* 7.5* 7.8* 7.3* 7.4* 7.1* 7.1*  HCT 27.6* 24.0* 26.0*  --  26.1* 23.8* 23.2*  MCV 66.3* 66.3* 66.5*  --  67.5* 66.4* 66.5*  PLT 434 353 453*  --  507* 551* 569*    Recent Results (from the past 240 hour(s))  Culture, blood (routine x 2)     Status: None   Collection Time: 06/23/15  7:01 PM  Result Value Ref Range Status   Specimen Description BLOOD LEFT AC  Final   Special Requests   Final    BOTTLES DRAWN AEROBIC AND ANAEROBIC  AER 1CC ANA 3CC   Culture  Setup Time   Final    GRAM POSITIVE COCCI ANAEROBIC BOTTLE ONLY CRITICAL RESULT CALLED TO, READ BACK BY AND VERIFIED WITH: ALECIA BABB,RN 06/24/2015 1022 BY J RAZZAKSUAREZ,MT    Culture   Final    GROUP A STREP (S.PYOGENES) ISOLATED ANAEROBIC BOTTLE ONLY There is no known Penicillin Resistant Beta Streptococcus in the [place] For patients that  are Penicillin-allergic, Erythromycin is 85-94% susceptible, and Clindamycin is 80% susceptible.  Contact Microbiology within 7 days if sensitivity testing is  required.      Report Status 06/28/2015 FINAL  Final   Organism ID, Bacteria GROUP A STREP (S.PYOGENES) ISOLATED  Final      Susceptibility   Group a strep (s.pyogenes) isolated - MIC*    VANCOMYCIN Value in next row Sensitive      SENSITIVE0.5    CLINDAMYCIN Value in next row Sensitive      SENSITIVE<=0.25    AMPICILLIN Value in next row Sensitive      SENSITIVE<=0.25    LEVOFLOXACIN Value in next row Sensitive      SENSITIVE1    LINEZOLID Value in next row Sensitive      SENSITIVE<=2    ERYTHROMYCIN Value in next row Sensitive      SENSITIVE<=0.12    * GROUP A STREP (S.PYOGENES) ISOLATED  Culture, blood (routine x 2)     Status: None   Collection Time: 06/23/15  7:52 PM  Result Value Ref Range Status   Specimen Description BLOOD RIGHT ASSIST CONTROL  Final   Special Requests BOTTLES DRAWN AEROBIC AND ANAEROBIC 5CC  Final   Culture  Setup Time   Final    GRAM POSITIVE COCCI IN CHAINS ANAEROBIC BOTTLE ONLY CRITICAL VALUE NOTED.  VALUE IS CONSISTENT WITH PREVIOUSLY REPORTED AND CALLED VALUE.    Culture   Final    GROUP A STREP (S.PYOGENES) ISOLATED ANAEROBIC BOTTLE ONLY There is no known Penicillin Resistant Beta Streptococcus in the [place] For patients that are Penicillin-allergic, Erythromycin is 85-94% susceptible, and Clindamycin is 80% susceptible.  Contact Microbiology within 7 days if sensitivity testing is  required.      Report Status 06/28/2015 FINAL  Final   Organism ID, Bacteria GROUP A STREP (S.PYOGENES) ISOLATED  Final      Susceptibility   Group a strep (s.pyogenes) isolated - MIC*    ERYTHROMYCIN Value in next row Sensitive      SENSITIVE<=0.12    VANCOMYCIN Value in next row Sensitive      SENSITIVE0.5    CLINDAMYCIN Value in next row Sensitive      SENSITIVE<=0.25    LEVOFLOXACIN Value in next row  Sensitive      SENSITIVE1    LINEZOLID Value in next row Sensitive      SENSITIVE<=2    AMPICILLIN Value in next row Sensitive      SENSITIVE<=0.25    * GROUP A STREP (S.PYOGENES) ISOLATED  Culture, blood (routine x 2)  Status: None (Preliminary result)   Collection Time: 06/26/15  8:30 AM  Result Value Ref Range Status   Specimen Description BLOOD RIGHT HAND  Final   Special Requests BOTTLES DRAWN AEROBIC AND ANAEROBIC  4CC  Final   Culture NO GROWTH 3 DAYS  Final   Report Status PENDING  Incomplete  Culture, blood (routine x 2)     Status: None (Preliminary result)   Collection Time: 06/26/15  8:31 AM  Result Value Ref Range Status   Specimen Description BLOOD RIGHT HAND  Final   Special Requests   Final    BOTTLES DRAWN AEROBIC AND ANAEROBIC  AER 3CC ANA 6CC   Culture NO GROWTH 3 DAYS  Final   Report Status PENDING  Incomplete  Body fluid culture     Status: None (Preliminary result)   Collection Time: 06/27/15 12:20 PM  Result Value Ref Range Status   Specimen Description PLEURAL  Final   Special Requests NONE  Final   Gram Stain FEW WBC SEEN NO ORGANISMS SEEN   Final   Culture NO GROWTH 2 DAYS  Final   Report Status PENDING  Incomplete     Scheduled Meds: . clindamycin (CLEOCIN) IV  900 mg Intravenous 3 times per day  . ferrous sulfate  325 mg Oral BID WC  . pencillin G potassium IV  4 Million Units Intravenous 6 times per day  . sodium chloride  3 mL Intravenous Q12H   Continuous Infusions:    Assessment/Plan:  1. Sepsis with group A  Streptococcus Pyogenes. Pneumonia with loculated pleural effusion. Status post thoracentesis of 50 cc of fluid. No growth at of thoracentesis fluid on culture. Appreciate an infectious disease consultation and antibiotics were changed to penicillin G and clindamycin. White blood cell count trending better. Plan is to repeat a chest x-ray tomorrow and white blood cell count. If the chest x-ray still shows loculated effusion a CT scan  can be obtained. If the chest x-ray does not show loculated effusion and white count still trends better patient potentially can go home soon. 2. Partial small bowel obstruction. CT scan showing numerous small bowel dilation. Advance diet to regular since she had a bowel movement yesterday. 3. Severe hypokalemia-improved 4. Microcytic anemia, iron and TIBC low. Continue iron. Hemoglobin today 7.1.   Code Status:     Code Status Orders        Start     Ordered   06/23/15 2342  Full code   Continuous     06/23/15 2341     Disposition Plan: Hopefully home tomorrow if chest x-ray does not show a loculated effusion.  Antibiotics:  Clindamycin  Penicillin G  Time spent: 20 minutes  Loletha Grayer  [facility]

## 2015-06-30 ENCOUNTER — Inpatient Hospital Stay: Payer: Self-pay

## 2015-06-30 ENCOUNTER — Encounter: Payer: Self-pay | Admitting: Radiology

## 2015-06-30 ENCOUNTER — Telehealth: Payer: Self-pay

## 2015-06-30 LAB — CBC
HCT: 23.6 % — ABNORMAL LOW (ref 35.0–47.0)
HEMOGLOBIN: 7.4 g/dL — AB (ref 12.0–16.0)
MCH: 20.8 pg — AB (ref 26.0–34.0)
MCHC: 31.2 g/dL — ABNORMAL LOW (ref 32.0–36.0)
MCV: 66.6 fL — AB (ref 80.0–100.0)
Platelets: 651 10*3/uL — ABNORMAL HIGH (ref 150–440)
RBC: 3.54 MIL/uL — AB (ref 3.80–5.20)
RDW: 15.9 % — ABNORMAL HIGH (ref 11.5–14.5)
WBC: 11.9 10*3/uL — AB (ref 3.6–11.0)

## 2015-06-30 LAB — BASIC METABOLIC PANEL
ANION GAP: 6 (ref 5–15)
CALCIUM: 8.2 mg/dL — AB (ref 8.9–10.3)
CO2: 24 mmol/L (ref 22–32)
Chloride: 104 mmol/L (ref 101–111)
Creatinine, Ser: 0.46 mg/dL (ref 0.44–1.00)
GFR calc Af Amer: >60 mL/min (ref >60)
GLUCOSE: 99 mg/dL (ref 65–99)
POTASSIUM: 4.2 mmol/L (ref 3.5–5.1)
Sodium: 134 mmol/L — ABNORMAL LOW (ref 135–145)

## 2015-06-30 IMAGING — CT CT CHEST W/ CM
1 series · 15 of 33 positions shown, 19 images · IV contrast (omnipaque)
Comparison: Chest radiograph of same day.

CLINICAL DATA: Pleural effusion.

EXAM:
CT CHEST WITH CONTRAST
TECHNIQUE: Multidetector CT imaging of the chest was performed during
intravenous contrast administration.
CONTRAST:  75mL OMNIPAQUE IOHEXOL 300 MG/ML  SOLN

[Series 2: routine chest with · axial · 0.65mm/px · z∈[-620,-380]mm · 15 of 58 slices shown, 19 images]
[im 5/58  mediastinal]
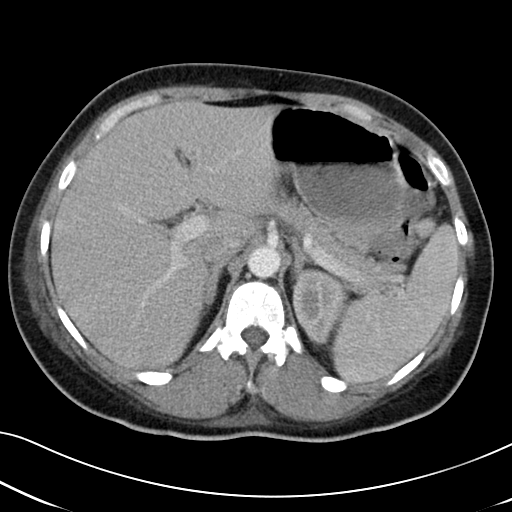
[im 5/58  lung]
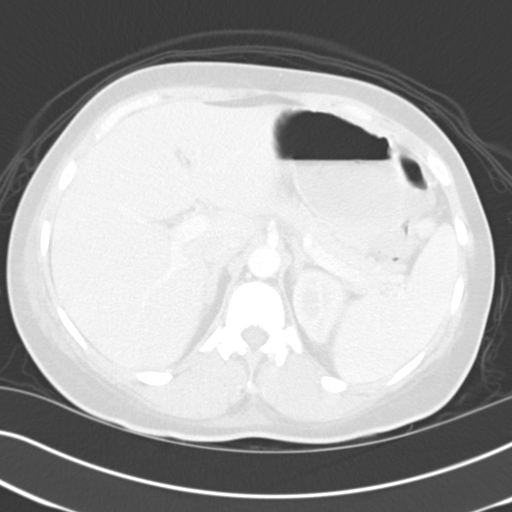
[im 9/58  lung]
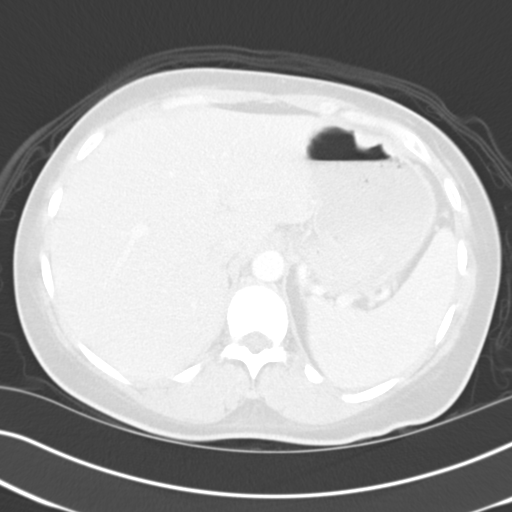
[im 12/58  lung]
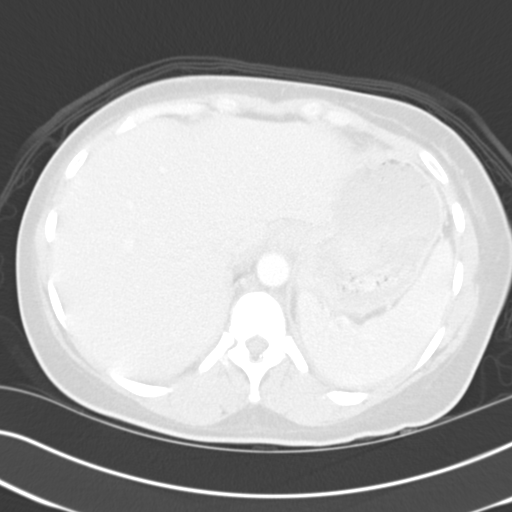
[im 15/58  lung]
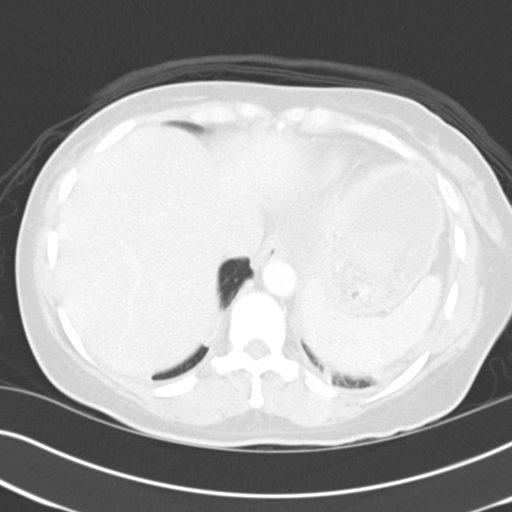
[im 20/58  mediastinal]
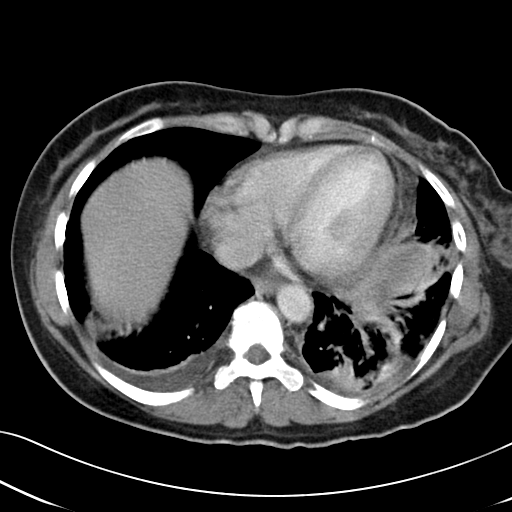
[im 20/58  lung]
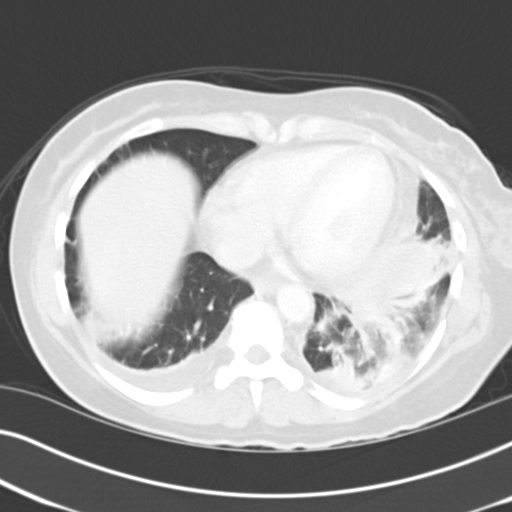
[im 23/58  lung]
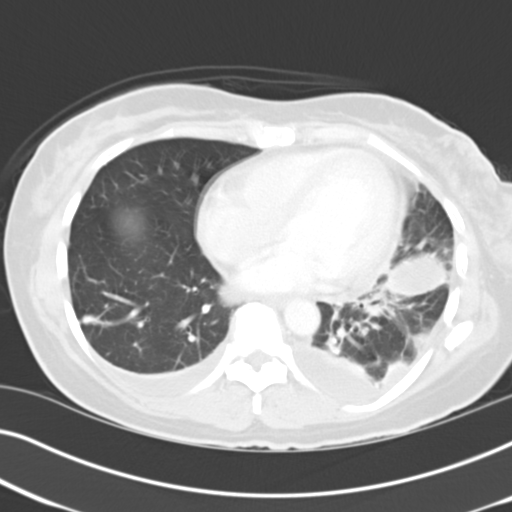
[im 26/58  lung]
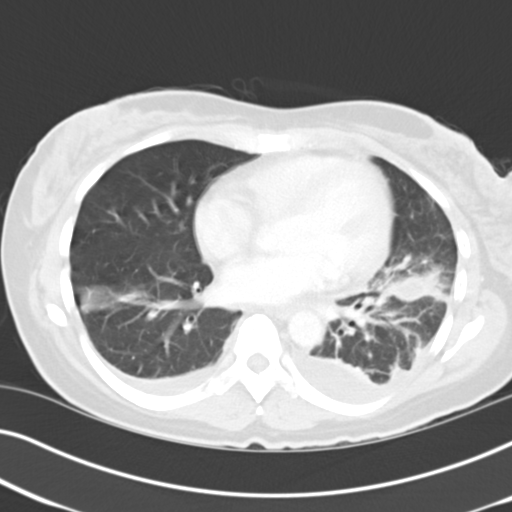
[im 30/58  lung]
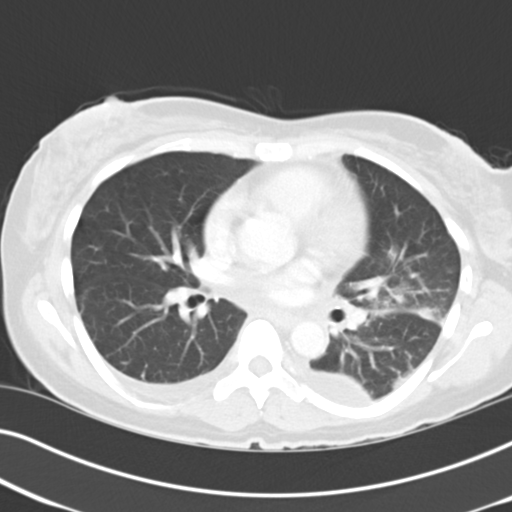
[im 32/58  mediastinal]
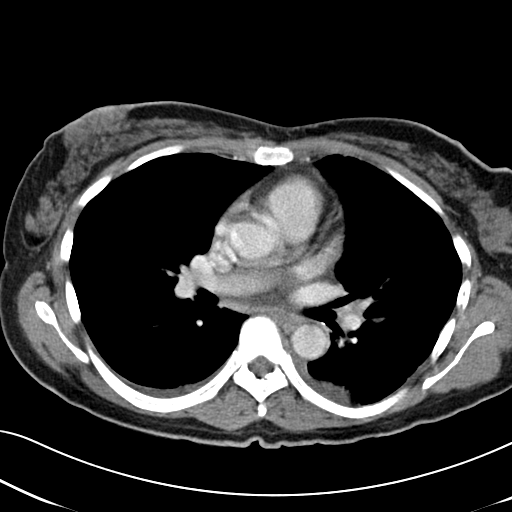
[im 32/58  lung]
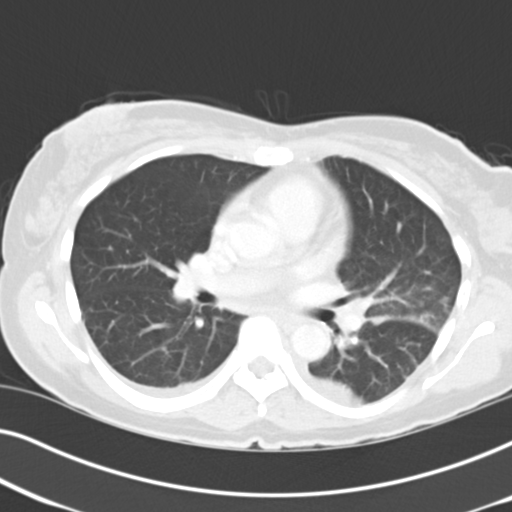
[im 35/58  lung]
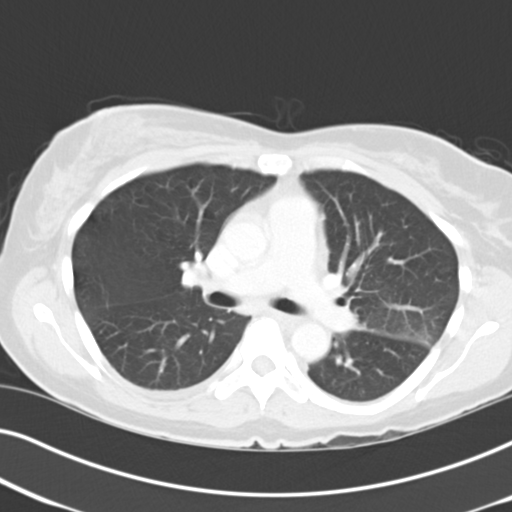
[im 39/58  lung]
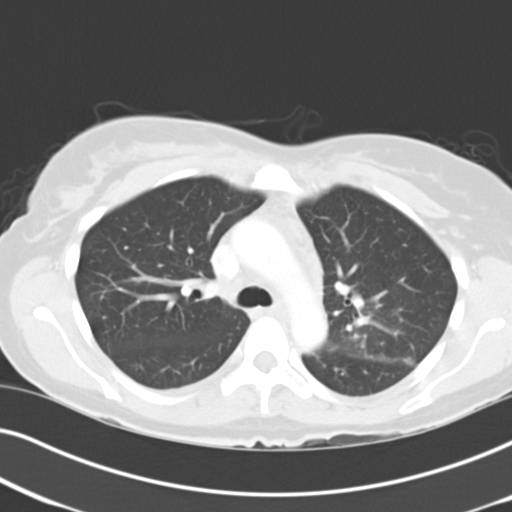
[im 43/58  lung]
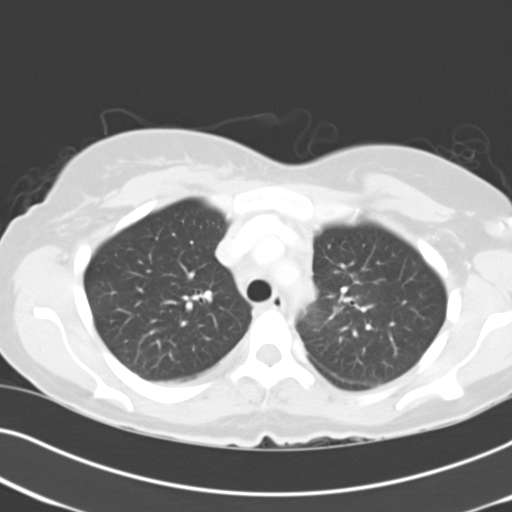
[im 46/58  mediastinal]
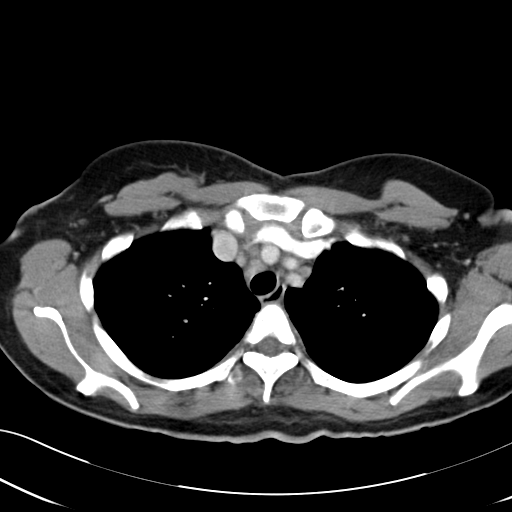
[im 46/58  lung]
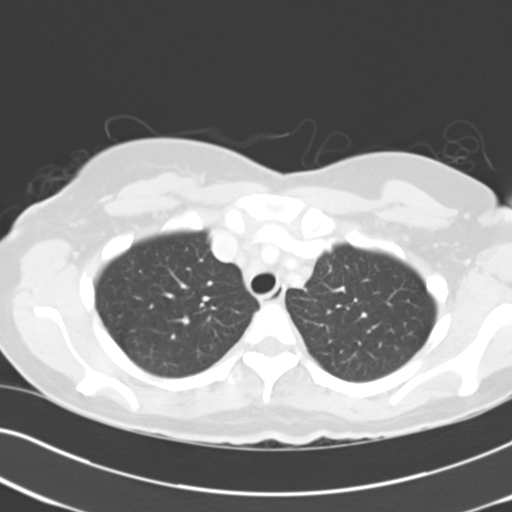
[im 49/58  lung]
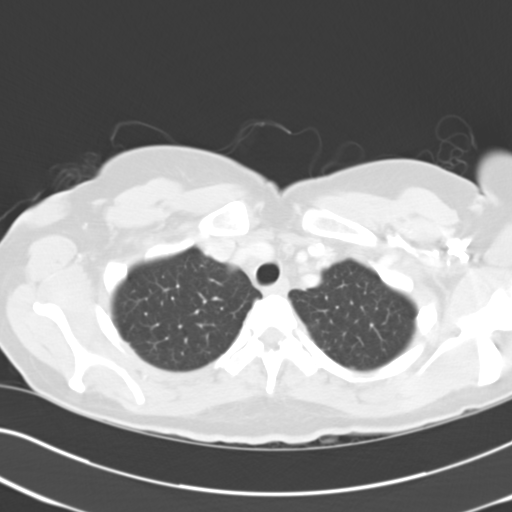
[im 53/58  lung]
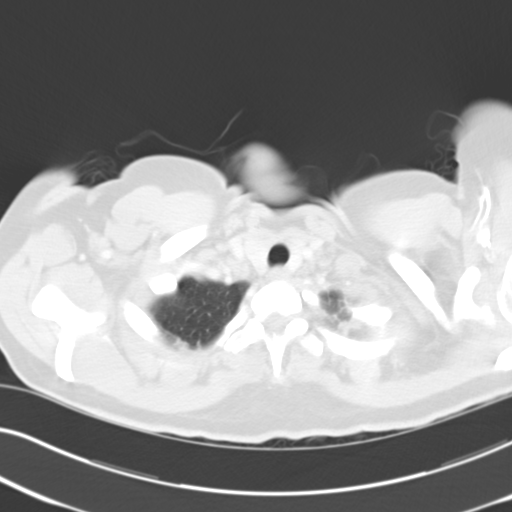

[15 of 33 positions shown; findings below may reference images not displayed]

FINDINGS: No pneumothorax is noted. Some scarring is noted in right lung base.
Atelectasis or pneumonia is noted in the left lung base posteriorly.
Small right pleural effusion is noted with adjacent subsegmental
atelectasis. Loculated effusion is noted posteriorly in the left
hemothorax, with loculated effusion seen in the inferior portion of
the left major fissure. Visualized portion of upper abdomen is
unremarkable. There is no evidence of thoracic aortic dissection or
aneurysm. No significant mediastinal mass or adenopathy is noted. No
significant osseous abnormality is noted.
IMPRESSION: Small right pleural effusion with adjacent subsegmental atelectasis.

Loculated pleural effusion is seen posteriorly in the left lung
base, with smaller loculated effusion seen in the inferior portion
of the left major fissure. There is surrounding opacity in the left
lung base concerning for pneumonia or atelectasis.

## 2015-06-30 MED ORDER — PENICILLIN V POTASSIUM 500 MG PO TABS: 500.0000 mg | ORAL_TABLET | Freq: Four times a day (QID) | ORAL | Status: DC

## 2015-06-30 MED ORDER — FERROUS SULFATE 325 (65 FE) MG PO TABS: 325.0000 mg | ORAL_TABLET | Freq: Two times a day (BID) | ORAL | Status: DC

## 2015-06-30 MED ORDER — IOHEXOL 300 MG/ML  SOLN
75.0000 mL | Freq: Once | INTRAMUSCULAR | Status: AC | PRN
  Administered 2015-06-30: 75 mL via INTRAVENOUS

## 2015-06-30 NOTE — Telephone Encounter (Signed)
Post-discharge call made to patient at this time. No answer. Left voicemail for return phone call.  

## 2015-06-30 NOTE — Progress Notes (Signed)
ID E note Reviewed CXR, no fevers, wbc down to 11  Rec If clinically stable today would dc on oral therapy with PCN 500 mg po qid for 14 day total course - will need to be seen at end of course to repeat CXR to ensure resolution I can see at end of therapy to repeat cxr.

## 2015-06-30 NOTE — Discharge Summary (Signed)
[facility] [place] NAME: Jessica Willis    MR#:  [id number]  DATE OF BIRTH:  09-28-1965  DATE OF ADMISSION:  06/23/2015 ADMITTING PHYSICIAN: Lance Coon, MD  DATE OF DISCHARGE: 06/30/15  PRIMARY CARE PHYSICIAN: No PCP Per Patient    ADMISSION DIAGNOSIS:  Leukocytosis [D72.829] Diarrhea of infectious origin [A09] Community acquired pneumonia [J18.9] Hypokalemia due to loss of potassium [E87.6]  DISCHARGE DIAGNOSIS:  Principal Problem:   Sepsis ([place]) Active Problems:   CAP (community acquired pneumonia)   Partial small bowel obstruction (HCC)   Hypokalemia   Intestinal obstruction (HCC)   Leukocytosis   SECONDARY DIAGNOSIS:   Past Medical History  Diagnosis Date  . Patient denies medical problems   . Anemia     HOSPITAL COURSE:   1. Sepsis with group A Streptococcus Pyogenes.:  Pneumonia with loculated pleural effusion. Status post thoracentesis  And negative thoracentesis fluid on culture.  - CT of the chest with improving bibasilar pneumonia. Left pleural effusion is still loculated but no empyema noted. - Appreciate an infectious disease consultation - Patient will be discharged on 2 week course of penicillin orally for her strep pyogenes bacteremia and pneumonia. -She will need an outpatient chest x-ray follow-up.  2. Partial small bowel obstruction on admission-resolved. Patient able to tolerate regular diet at this time.  3. Hypokalemia-resolved  4. Chronic iron deficiency anemia- iron and TIBC low. Hemoglobin is stable around 7. Patient on her menstrual cycle at this time. -Continue iron supplements at discharge  Patient is being discharged today.  DISCHARGE CONDITIONS:   Stable  CONSULTS OBTAINED:  Treatment Team:  Adrian Prows, MD  DRUG ALLERGIES:  No Known Allergies  DISCHARGE MEDICATIONS:   Current Discharge Medication List    START taking these medications   Details   ferrous sulfate 325 (65 FE) MG tablet Take 1 tablet (325 mg total) by mouth 2 (two) times daily with a meal. Qty: 60 tablet, Refills: 3    penicillin v potassium (VEETID) 500 MG tablet Take 1 tablet (500 mg total) by mouth 4 (four) times daily. Qty: 56 tablet, Refills: 0         DISCHARGE INSTRUCTIONS:   1. Follow up Dr. Ola Spurr in 10 days  If you experience worsening of your admission symptoms, develop shortness of breath, life threatening emergency, suicidal or homicidal thoughts you must seek medical attention immediately by calling 911 or calling your MD immediately  if symptoms less severe.  You Must read complete instructions/literature along with all the possible adverse reactions/side effects for all the Medicines you take and that have been prescribed to you. Take any new Medicines after you have completely understood and accept all the possible adverse reactions/side effects.   Please note  You were cared for by a hospitalist during your hospital stay. If you have any questions about your discharge medications or the care you received while you were in the hospital after you are discharged, you can call the unit and asked to speak with the hospitalist on call if the hospitalist that took care of you is not available. Once you are discharged, your primary care physician will handle any further medical issues. Please note that NO REFILLS for any discharge medications will be authorized once you are discharged, as it is imperative that you return to your primary care physician (or establish a relationship with a primary care physician if you do not have one) for your aftercare needs so that  they can reassess your need for medications and monitor your lab values.    Today   CHIEF COMPLAINT:   Chief Complaint  Patient presents with  . Influenza     VITAL SIGNS:  Blood pressure 126/89, pulse 108, temperature 98.2 F (36.8 C), temperature source Oral, resp. rate 16, height  5\' 9"  (1.753 m), weight 80.604 kg (177 lb 11.2 oz), last menstrual period 06/16/2015, SpO2 98 %.  I/O:   Intake/Output Summary (Last 24 hours) at 06/30/15 1521 Last data filed at 06/30/15 0700  Gross per 24 hour  Intake    580 ml  Output   2300 ml  Net  -1720 ml    PHYSICAL EXAMINATION:   Physical Exam  GENERAL:  49 y.o.-year-old patient lying in the bed with no acute distress.  EYES: Pupils equal, round, reactive to light and accommodation. No scleral icterus. Extraocular muscles intact.  HEENT: Head atraumatic, normocephalic. Oropharynx and nasopharynx clear.  NECK:  Supple, no jugular venous distention. No thyroid enlargement, no tenderness.  LUNGS: Normal breath sounds bilaterally, decreased at the bases and some scattered rhonchi. no wheezing, rales or crepitation. No use of accessory muscles of respiration.  CARDIOVASCULAR: S1, S2 normal. No murmurs, rubs, or gallops.  ABDOMEN: Soft, non-tender, non-distended. Bowel sounds present. No organomegaly or mass.  EXTREMITIES: No pedal edema, cyanosis, or clubbing.  NEUROLOGIC: Cranial nerves II through XII are intact. Muscle strength 5/5 in all extremities. Sensation intact. Gait not checked.  PSYCHIATRIC: The patient is alert and oriented x 3.  SKIN: No obvious rash, lesion, or ulcer.   DATA REVIEW:   CBC  Recent Labs Lab 06/30/15 0336  WBC 11.9*  HGB 7.4*  HCT 23.6*  PLT 651*    Chemistries   Recent Labs Lab 06/23/15 1822  06/24/15 1609  06/30/15 0336  NA 139  < >  --   < > 134*  K 2.7*  < > 3.1*  < > 4.2  CL 100*  < >  --   < > 104  CO2 28  < >  --   < > 24  GLUCOSE 106*  < >  --   < > 99  BUN 17  < >  --   < > <5*  CREATININE 0.65  < >  --   < > 0.46  CALCIUM 8.4*  < >  --   < > 8.2*  MG 2.4  --  2.5*  --   --   AST 21  --   --   --   --   ALT 10*  --   --   --   --   ALKPHOS 88  --   --   --   --   BILITOT 0.8  --   --   --   --   < > = values in this interval not displayed.  Cardiac Enzymes No  results for input(s): TROPONINI in the last 168 hours.  Microbiology Results  Results for orders placed or performed during the hospital encounter of 06/23/15  Culture, blood (routine x 2)     Status: None   Collection Time: 06/23/15  7:01 PM  Result Value Ref Range Status   Specimen Description BLOOD LEFT AC  Final   Special Requests   Final    BOTTLES DRAWN AEROBIC AND ANAEROBIC  AER 1CC ANA 3CC   Culture  Setup Time   Final    GRAM POSITIVE COCCI ANAEROBIC BOTTLE ONLY  CRITICAL RESULT CALLED TO, READ BACK BY AND VERIFIED WITH: ALECIA BABB,RN 06/24/2015 1022 BY J RAZZAKSUAREZ,MT    Culture   Final    GROUP A STREP (S.PYOGENES) ISOLATED ANAEROBIC BOTTLE ONLY There is no known Penicillin Resistant Beta Streptococcus in the [place] For patients that are Penicillin-allergic, Erythromycin is 85-94% susceptible, and Clindamycin is 80% susceptible.  Contact Microbiology within 7 days if sensitivity testing is  required.      Report Status 06/28/2015 FINAL  Final   Organism ID, Bacteria GROUP A STREP (S.PYOGENES) ISOLATED  Final      Susceptibility   Group a strep (s.pyogenes) isolated - MIC*    VANCOMYCIN Value in next row Sensitive      SENSITIVE0.5    CLINDAMYCIN Value in next row Sensitive      SENSITIVE<=0.25    AMPICILLIN Value in next row Sensitive      SENSITIVE<=0.25    LEVOFLOXACIN Value in next row Sensitive      SENSITIVE1    LINEZOLID Value in next row Sensitive      SENSITIVE<=2    ERYTHROMYCIN Value in next row Sensitive      SENSITIVE<=0.12    * GROUP A STREP (S.PYOGENES) ISOLATED  Culture, blood (routine x 2)     Status: None   Collection Time: 06/23/15  7:52 PM  Result Value Ref Range Status   Specimen Description BLOOD RIGHT ASSIST CONTROL  Final   Special Requests BOTTLES DRAWN AEROBIC AND ANAEROBIC 5CC  Final   Culture  Setup Time   Final    GRAM POSITIVE COCCI IN CHAINS ANAEROBIC BOTTLE ONLY CRITICAL VALUE NOTED.  VALUE IS CONSISTENT WITH PREVIOUSLY REPORTED  AND CALLED VALUE.    Culture   Final    GROUP A STREP (S.PYOGENES) ISOLATED ANAEROBIC BOTTLE ONLY There is no known Penicillin Resistant Beta Streptococcus in the [place] For patients that are Penicillin-allergic, Erythromycin is 85-94% susceptible, and Clindamycin is 80% susceptible.  Contact Microbiology within 7 days if sensitivity testing is  required.      Report Status 06/28/2015 FINAL  Final   Organism ID, Bacteria GROUP A STREP (S.PYOGENES) ISOLATED  Final      Susceptibility   Group a strep (s.pyogenes) isolated - MIC*    ERYTHROMYCIN Value in next row Sensitive      SENSITIVE<=0.12    VANCOMYCIN Value in next row Sensitive      SENSITIVE0.5    CLINDAMYCIN Value in next row Sensitive      SENSITIVE<=0.25    LEVOFLOXACIN Value in next row Sensitive      SENSITIVE1    LINEZOLID Value in next row Sensitive      SENSITIVE<=2    AMPICILLIN Value in next row Sensitive      SENSITIVE<=0.25    * GROUP A STREP (S.PYOGENES) ISOLATED  Culture, blood (routine x 2)     Status: None (Preliminary result)   Collection Time: 06/26/15  8:30 AM  Result Value Ref Range Status   Specimen Description BLOOD RIGHT HAND  Final   Special Requests BOTTLES DRAWN AEROBIC AND ANAEROBIC  4CC  Final   Culture NO GROWTH 4 DAYS  Final   Report Status PENDING  Incomplete  Culture, blood (routine x 2)     Status: None (Preliminary result)   Collection Time: 06/26/15  8:31 AM  Result Value Ref Range Status   Specimen Description BLOOD RIGHT HAND  Final   Special Requests   Final    BOTTLES DRAWN AEROBIC AND  ANAEROBIC  AER 3CC ANA 6CC   Culture NO GROWTH 4 DAYS  Final   Report Status PENDING  Incomplete  Body fluid culture     Status: None (Preliminary result)   Collection Time: 06/27/15 12:20 PM  Result Value Ref Range Status   Specimen Description PLEURAL  Final   Special Requests NONE  Final   Gram Stain FEW WBC SEEN NO ORGANISMS SEEN   Final   Culture NO GROWTH 3 DAYS  Final   Report Status  PENDING  Incomplete    RADIOLOGY:  Dg Chest 2 View  06/29/2015  CLINICAL DATA:  Sepsis. Fever. Chills. Recent admission for pneumonia. EXAM: CHEST  2 VIEW COMPARISON:  06/27/2015 FINDINGS: Midline trachea. Normal heart size. Small left pleural effusion again identified. There may be developing loculation posteriorly. No pneumothorax. Left lower and inferior left upper lobe airspace disease is similar. Clear right lung. IMPRESSION: Similar left-sided airspace disease, likely pneumonia. Followup PA and lateral chest X-ray is recommended in 3-4 weeks following trial of antibiotic therapy to ensure resolution and exclude underlying malignancy. Small left pleural effusion persists. Possible developing loculation posteriorly. Recommend attention on follow-up. Electronically Signed   By: Abigail Miyamoto M.D.   On: 06/29/2015 16:55   Ct Chest W Contrast  06/30/2015  CLINICAL DATA:  Pleural effusion. EXAM: CT CHEST WITH CONTRAST TECHNIQUE: Multidetector CT imaging of the chest was performed during intravenous contrast administration. CONTRAST:  18mL OMNIPAQUE IOHEXOL 300 MG/ML  SOLN COMPARISON:  Chest radiograph of same day. FINDINGS: No pneumothorax is noted. Some scarring is noted in right lung base. Atelectasis or pneumonia is noted in the left lung base posteriorly. Small right pleural effusion is noted with adjacent subsegmental atelectasis. Loculated effusion is noted posteriorly in the left hemothorax, with loculated effusion seen in the inferior portion of the left major fissure. Visualized portion of upper abdomen is unremarkable. There is no evidence of thoracic aortic dissection or aneurysm. No significant mediastinal mass or adenopathy is noted. No significant osseous abnormality is noted. IMPRESSION: Small right pleural effusion with adjacent subsegmental atelectasis. Loculated pleural effusion is seen posteriorly in the left lung base, with smaller loculated effusion seen in the inferior portion of the  left major fissure. There is surrounding opacity in the left lung base concerning for pneumonia or atelectasis. Electronically Signed   By: Marijo Conception, M.D.   On: 06/30/2015 12:56    EKG:  No orders found for this or any previous visit.    Management plans discussed with the patient, family and they are in agreement.  CODE STATUS:     Code Status Orders        Start     Ordered   06/23/15 2342  Full code   Continuous     06/23/15 2341      TOTAL TIME TAKING CARE OF THIS PATIENT: 37 minutes.    Gladstone Lighter M.D on 06/30/2015 at 3:21 PM  Between 7am to 6pm - Pager - [phone]  After 6pm go to [email or web address] - password [email or web address] [facility]  [facility]  Office  [phone]  CC: Primary care physician; No PCP Per Patient

## 2015-06-30 NOTE — Care Management (Signed)
Spoke with patient for discharge planning patient is from home with support. Stated that she ambulates well without assistance and drives self occasionally. Patient states that she works and has income greater that $1000 monthly. Patient will be discharged home on Penicillin V which will be $19 at [facility] with coupon. Patient states can afford this without difficulty. Gave applications for [facility] and [facility] as well as list of providers  for [employer]. Patient will follow up with infectious disease at discharge Will attempt to make PCP follow up prior to discharge. No other CM needs Identified.

## 2015-06-30 NOTE — Progress Notes (Signed)
Patient discharged to home as ordered. Follow up appt with Dr. Ola Spurr on 07/12/15 at 830am. Patient given prescriptions for iron and PCN along with coupon to get her antibiotic. Patient is alert and oriented No acute distress noted. Ambulates without assistance. Patient to call when her ride arrives to pick her up.

## 2015-07-01 LAB — CULTURE, BLOOD (ROUTINE X 2)
CULTURE: NO GROWTH
CULTURE: NO GROWTH

## 2015-07-01 LAB — BODY FLUID CULTURE: Culture: NO GROWTH

## 2015-07-01 NOTE — Telephone Encounter (Signed)
Called patient once again at this time. No answer. Left voicemail for return phone call.   Pt will follow-up with Dr. Ola Spurr per discharge instructions.

## 2015-07-05 LAB — PATHOLOGIST SMEAR REVIEW

## 2015-07-08 LAB — COMP PANEL: LEUKEMIA/LYMPHOMA

## 2020-06-15 ENCOUNTER — Emergency Department: Payer: Medicaid Other

## 2020-06-15 ENCOUNTER — Encounter: Payer: Self-pay | Admitting: Emergency Medicine

## 2020-06-15 ENCOUNTER — Other Ambulatory Visit: Payer: Self-pay

## 2020-06-15 ENCOUNTER — Inpatient Hospital Stay
Admission: EM | Admit: 2020-06-15 | Discharge: 2020-07-01 | DRG: 853 | Disposition: A | Discharge status: Home Health | Payer: Medicaid Other | Attending: Internal Medicine | Admitting: Internal Medicine

## 2020-06-15 DIAGNOSIS — R29898 Other symptoms and signs involving the musculoskeletal system: Secondary | ICD-10-CM | POA: Diagnosis not present

## 2020-06-15 DIAGNOSIS — K5900 Constipation, unspecified: Secondary | ICD-10-CM | POA: Diagnosis not present

## 2020-06-15 DIAGNOSIS — J918 Pleural effusion in other conditions classified elsewhere: Secondary | ICD-10-CM | POA: Diagnosis not present

## 2020-06-15 DIAGNOSIS — Z9114 Patient's other noncompliance with medication regimen: Secondary | ICD-10-CM | POA: Diagnosis not present

## 2020-06-15 DIAGNOSIS — R609 Edema, unspecified: Secondary | ICD-10-CM

## 2020-06-15 DIAGNOSIS — E876 Hypokalemia: Secondary | ICD-10-CM | POA: Diagnosis present

## 2020-06-15 DIAGNOSIS — Z9689 Presence of other specified functional implants: Secondary | ICD-10-CM

## 2020-06-15 DIAGNOSIS — C7951 Secondary malignant neoplasm of bone: Secondary | ICD-10-CM

## 2020-06-15 DIAGNOSIS — N6315 Unspecified lump in the right breast, overlapping quadrants: Secondary | ICD-10-CM | POA: Diagnosis not present

## 2020-06-15 DIAGNOSIS — D509 Iron deficiency anemia, unspecified: Secondary | ICD-10-CM | POA: Diagnosis present

## 2020-06-15 DIAGNOSIS — Z7189 Other specified counseling: Secondary | ICD-10-CM | POA: Diagnosis not present

## 2020-06-15 DIAGNOSIS — J9601 Acute respiratory failure with hypoxia: Secondary | ICD-10-CM | POA: Diagnosis present

## 2020-06-15 DIAGNOSIS — N61 Mastitis without abscess: Secondary | ICD-10-CM | POA: Diagnosis present

## 2020-06-15 DIAGNOSIS — Z95828 Presence of other vascular implants and grafts: Secondary | ICD-10-CM | POA: Diagnosis not present

## 2020-06-15 DIAGNOSIS — Z20822 Contact with and (suspected) exposure to covid-19: Secondary | ICD-10-CM | POA: Diagnosis present

## 2020-06-15 DIAGNOSIS — Z803 Family history of malignant neoplasm of breast: Secondary | ICD-10-CM | POA: Diagnosis not present

## 2020-06-15 DIAGNOSIS — A419 Sepsis, unspecified organism: Secondary | ICD-10-CM | POA: Diagnosis present

## 2020-06-15 DIAGNOSIS — N63 Unspecified lump in unspecified breast: Secondary | ICD-10-CM | POA: Diagnosis present

## 2020-06-15 DIAGNOSIS — K219 Gastro-esophageal reflux disease without esophagitis: Secondary | ICD-10-CM | POA: Diagnosis not present

## 2020-06-15 DIAGNOSIS — E538 Deficiency of other specified B group vitamins: Secondary | ICD-10-CM

## 2020-06-15 DIAGNOSIS — L03313 Cellulitis of chest wall: Secondary | ICD-10-CM | POA: Diagnosis present

## 2020-06-15 DIAGNOSIS — R6520 Severe sepsis without septic shock: Secondary | ICD-10-CM | POA: Diagnosis not present

## 2020-06-15 DIAGNOSIS — R000 Tachycardia, unspecified: Secondary | ICD-10-CM

## 2020-06-15 DIAGNOSIS — C50919 Malignant neoplasm of unspecified site of unspecified female breast: Secondary | ICD-10-CM | POA: Diagnosis not present

## 2020-06-15 DIAGNOSIS — J910 Malignant pleural effusion: Secondary | ICD-10-CM

## 2020-06-15 DIAGNOSIS — J90 Pleural effusion, not elsewhere classified: Secondary | ICD-10-CM | POA: Diagnosis not present

## 2020-06-15 DIAGNOSIS — R590 Localized enlarged lymph nodes: Secondary | ICD-10-CM | POA: Diagnosis present

## 2020-06-15 DIAGNOSIS — C50911 Malignant neoplasm of unspecified site of right female breast: Secondary | ICD-10-CM | POA: Diagnosis present

## 2020-06-15 DIAGNOSIS — A4189 Other specified sepsis: Secondary | ICD-10-CM | POA: Diagnosis not present

## 2020-06-15 DIAGNOSIS — J189 Pneumonia, unspecified organism: Secondary | ICD-10-CM | POA: Diagnosis present

## 2020-06-15 DIAGNOSIS — J942 Hemothorax: Secondary | ICD-10-CM | POA: Diagnosis present

## 2020-06-15 DIAGNOSIS — R5381 Other malaise: Secondary | ICD-10-CM | POA: Diagnosis present

## 2020-06-15 DIAGNOSIS — Y95 Nosocomial condition: Secondary | ICD-10-CM | POA: Diagnosis present

## 2020-06-15 DIAGNOSIS — D72829 Elevated white blood cell count, unspecified: Secondary | ICD-10-CM

## 2020-06-15 DIAGNOSIS — R0602 Shortness of breath: Secondary | ICD-10-CM

## 2020-06-15 DIAGNOSIS — N6310 Unspecified lump in the right breast, unspecified quadrant: Secondary | ICD-10-CM

## 2020-06-15 DIAGNOSIS — D638 Anemia in other chronic diseases classified elsewhere: Secondary | ICD-10-CM | POA: Diagnosis not present

## 2020-06-15 DIAGNOSIS — Z01811 Encounter for preprocedural respiratory examination: Secondary | ICD-10-CM

## 2020-06-15 DIAGNOSIS — Z171 Estrogen receptor negative status [ER-]: Secondary | ICD-10-CM | POA: Diagnosis not present

## 2020-06-15 DIAGNOSIS — T451X5A Adverse effect of antineoplastic and immunosuppressive drugs, initial encounter: Secondary | ICD-10-CM

## 2020-06-15 DIAGNOSIS — Z515 Encounter for palliative care: Secondary | ICD-10-CM

## 2020-06-15 DIAGNOSIS — Z09 Encounter for follow-up examination after completed treatment for conditions other than malignant neoplasm: Secondary | ICD-10-CM

## 2020-06-15 DIAGNOSIS — Z862 Personal history of diseases of the blood and blood-forming organs and certain disorders involving the immune mechanism: Secondary | ICD-10-CM | POA: Diagnosis not present

## 2020-06-15 DIAGNOSIS — Z5111 Encounter for antineoplastic chemotherapy: Secondary | ICD-10-CM | POA: Diagnosis present

## 2020-06-15 LAB — CBC WITH DIFFERENTIAL/PLATELET
Abs Immature Granulocytes: 0.08 10*3/uL — ABNORMAL HIGH (ref 0.00–0.07)
Basophils Absolute: 0.1 10*3/uL (ref 0.0–0.1)
Basophils Relative: 1 %
Eosinophils Absolute: 0 10*3/uL (ref 0.0–0.5)
Eosinophils Relative: 0 %
HCT: 36.3 % (ref 36.0–46.0)
Hemoglobin: 11.5 g/dL — ABNORMAL LOW (ref 12.0–15.0)
Immature Granulocytes: 1 %
Lymphocytes Relative: 10 %
Lymphs Abs: 1.5 10*3/uL (ref 0.7–4.0)
MCH: 26 pg (ref 26.0–34.0)
MCHC: 31.7 g/dL (ref 30.0–36.0)
MCV: 82.1 fL (ref 80.0–100.0)
Monocytes Absolute: 1.1 10*3/uL — ABNORMAL HIGH (ref 0.1–1.0)
Monocytes Relative: 7 %
Neutro Abs: 12.3 10*3/uL — ABNORMAL HIGH (ref 1.7–7.7)
Neutrophils Relative %: 81 %
Platelets: 568 10*3/uL — ABNORMAL HIGH (ref 150–400)
RBC: 4.42 MIL/uL (ref 3.87–5.11)
RDW: 12.6 % (ref 11.5–15.5)
WBC: 15.1 10*3/uL — ABNORMAL HIGH (ref 4.0–10.5)
nRBC: 0 % (ref 0.0–0.2)

## 2020-06-15 LAB — RESPIRATORY PANEL BY RT PCR (FLU A&B, COVID)
Influenza A by PCR: NEGATIVE
Influenza B by PCR: NEGATIVE
SARS Coronavirus 2 by RT PCR: NEGATIVE

## 2020-06-15 LAB — PROCALCITONIN: Procalcitonin: 24.07 ng/mL

## 2020-06-15 LAB — BRAIN NATRIURETIC PEPTIDE: B Natriuretic Peptide: 49.6 pg/mL (ref 0.0–100.0)

## 2020-06-15 LAB — COMPREHENSIVE METABOLIC PANEL
ALT: 29 U/L (ref 0–44)
AST: 43 U/L — ABNORMAL HIGH (ref 15–41)
Albumin: 4 g/dL (ref 3.5–5.0)
Alkaline Phosphatase: 107 U/L (ref 38–126)
Anion gap: 17 — ABNORMAL HIGH (ref 5–15)
BUN: 22 mg/dL — ABNORMAL HIGH (ref 6–20)
CO2: 25 mmol/L (ref 22–32)
Calcium: 9.5 mg/dL (ref 8.9–10.3)
Chloride: 101 mmol/L (ref 98–111)
Creatinine, Ser: 0.61 mg/dL (ref 0.44–1.00)
GFR, Estimated: >60 mL/min (ref >60)
Glucose, Bld: 146 mg/dL — ABNORMAL HIGH (ref 70–99)
Potassium: 3.3 mmol/L — ABNORMAL LOW (ref 3.5–5.1)
Sodium: 143 mmol/L (ref 135–145)
Total Bilirubin: 1 mg/dL (ref 0.3–1.2)
Total Protein: 9 g/dL — ABNORMAL HIGH (ref 6.5–8.1)

## 2020-06-15 LAB — LACTIC ACID, PLASMA: Lactic Acid, Venous: 2.8 mmol/L (ref 0.5–1.9)

## 2020-06-15 LAB — BLOOD GAS, VENOUS
Acid-Base Excess: 1.8 mmol/L (ref 0.0–2.0)
Bicarbonate: 28.2 mmol/L — ABNORMAL HIGH (ref 20.0–28.0)
O2 Saturation: 67.2 %
Patient temperature: 37
pCO2, Ven: 51 mmHg (ref 44.0–60.0)
pH, Ven: 7.35 (ref 7.250–7.430)
pO2, Ven: 37 mmHg (ref 32.0–45.0)

## 2020-06-15 LAB — TROPONIN I (HIGH SENSITIVITY): Troponin I (High Sensitivity): 4 ng/L (ref <18)

## 2020-06-15 LAB — MAGNESIUM: Magnesium: 2.2 mg/dL (ref 1.7–2.4)

## 2020-06-15 LAB — FIBRIN DERIVATIVES D-DIMER (ARMC ONLY): Fibrin derivatives D-dimer (ARMC): 4795.31 ng/mL (FEU) — ABNORMAL HIGH (ref 0.00–499.00)

## 2020-06-15 IMAGING — DX DG CHEST 1V PORT
1 series · 2 of 2 positions shown · non-contrast
Comparison: Radiograph and CT earlier today.

CLINICAL DATA: Right chest tube placement.

EXAM:
PORTABLE CHEST 1 VIEW

[Series 1: chest ap · 0.14mm/px · 2 of 2 slices shown]
[im 1/2]
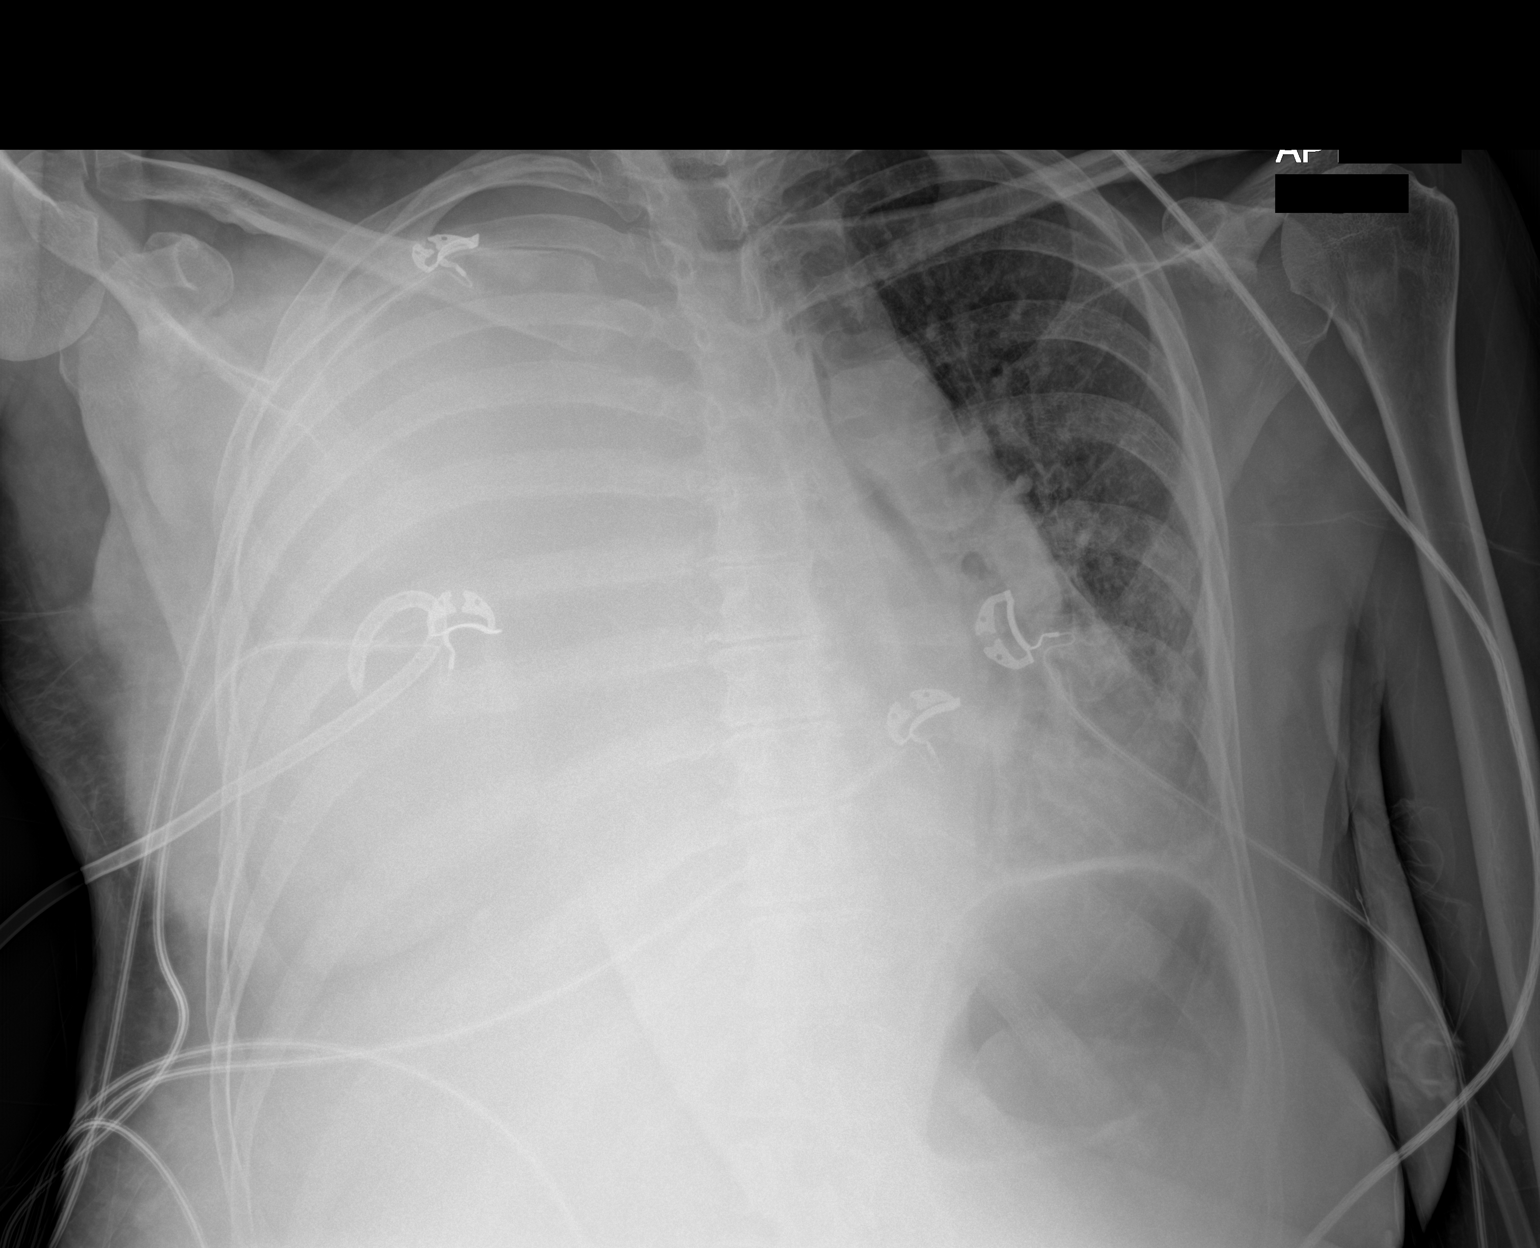
[im 2/2]
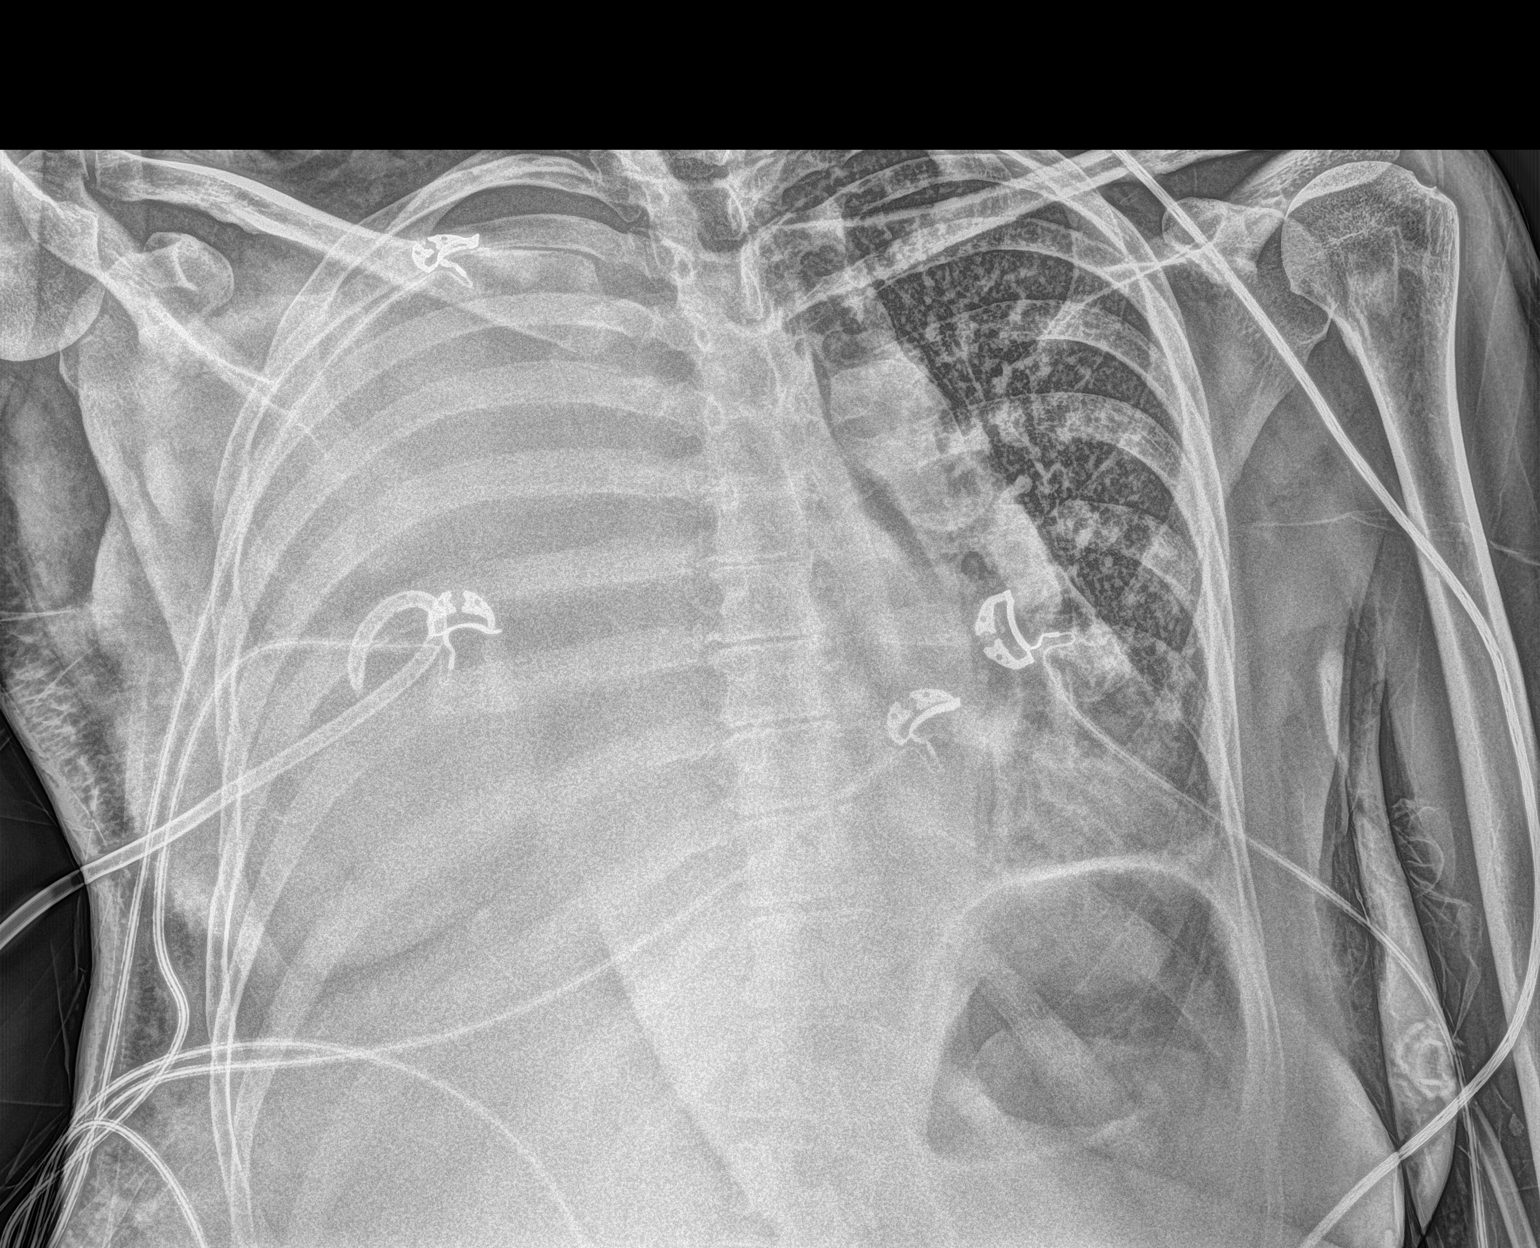

[2 of 2 positions shown; findings below may reference images not displayed]

FINDINGS: Placement of right pigtail catheter. There is persistent complete
opacification of the right hemithorax. There may be slight
diminished mediastinal shift to the left. No visualized
pneumothorax. No focal abnormality in the left lung.
IMPRESSION: Placement of right pigtail catheter with persistent complete
opacification of the right hemithorax. There may be slight
diminished mediastinal shift to the left. No visualized
pneumothorax.

## 2020-06-15 IMAGING — DX DG CHEST 1V
2 series · 2 of 2 positions shown · non-contrast
Comparison: [DATE]

CLINICAL DATA: Short of breath, cough

EXAM:
CHEST  1 VIEW

[chest ap (1 of 2)]
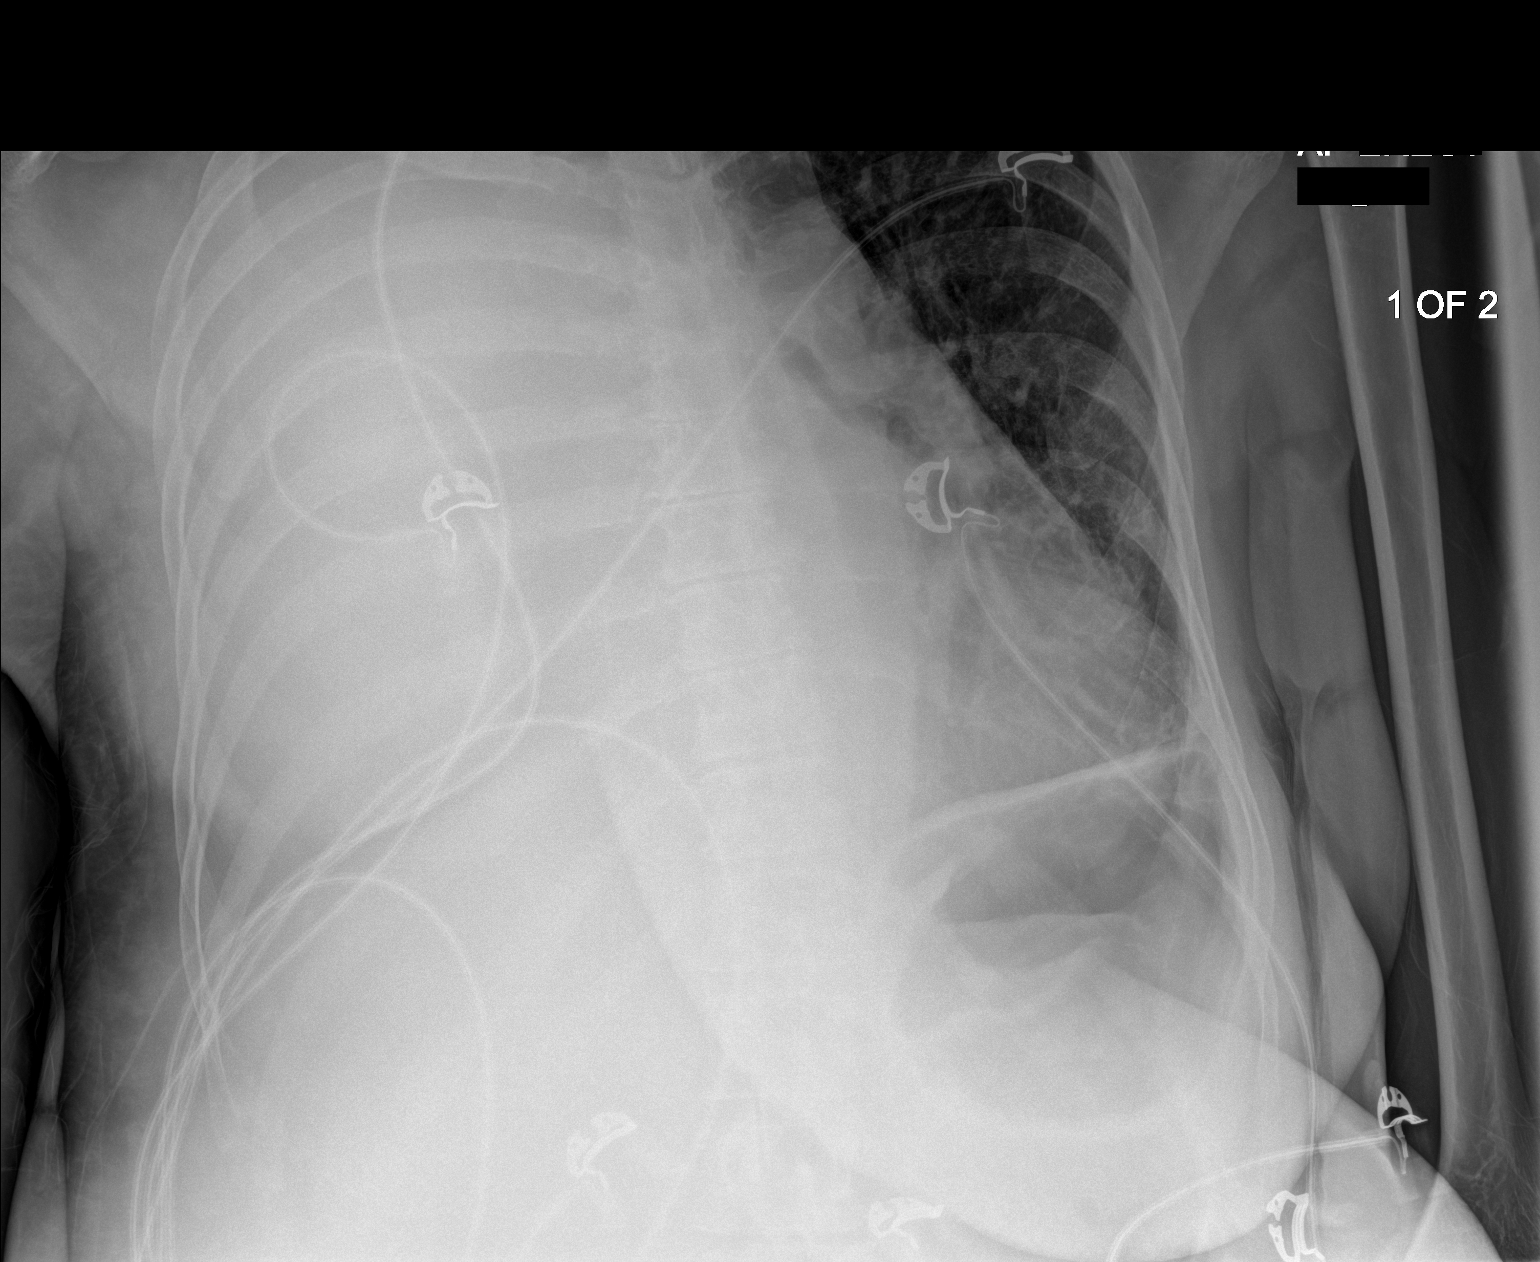

[chest ap (2 of 2)]
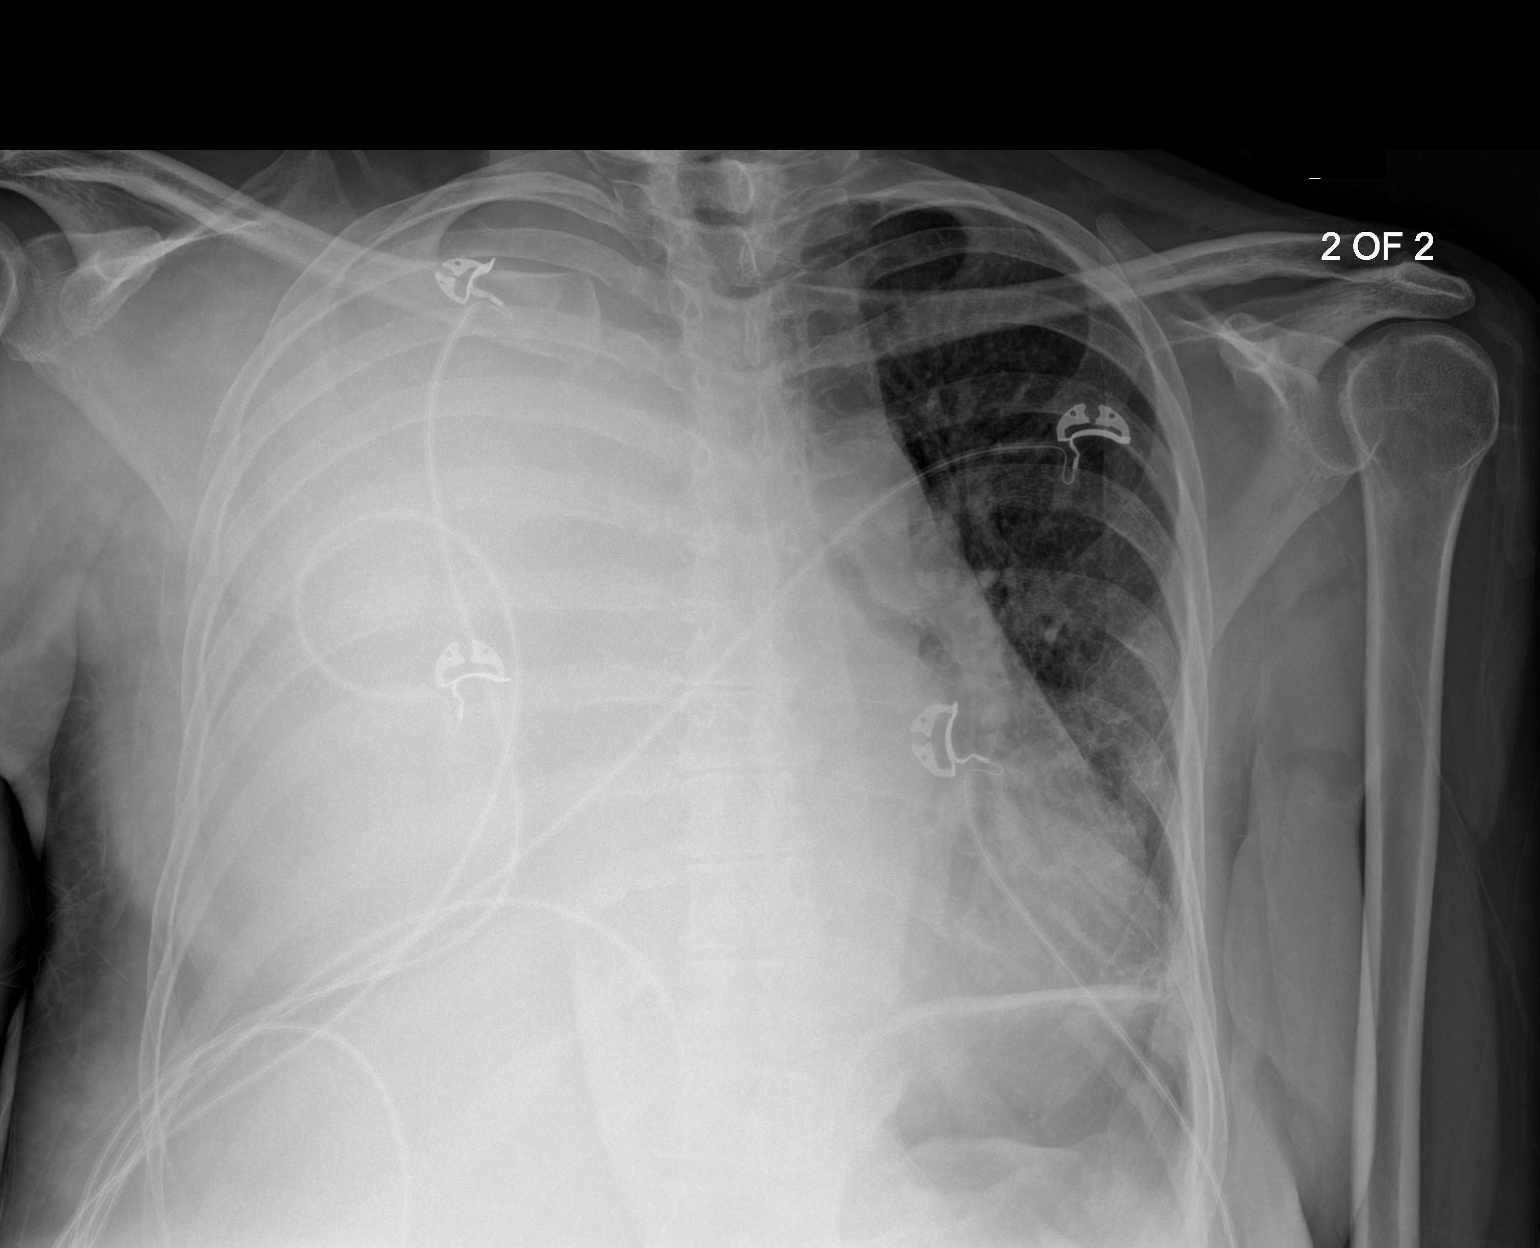

[2 of 2 positions shown; findings below may reference images not displayed]

FINDINGS: 2 frontal views of the chest demonstrate complete opacification of
the right hemithorax, with slight mediastinal shift to the left,
consistent with large right pleural effusion and underlying lung
consolidation. Chronic areas of scarring are seen within the left
chest. No pneumothorax. Cardiac silhouette is unremarkable. No acute
bony abnormalities.
IMPRESSION: 1. Opacification of the right hemithorax consistent with large right
pleural effusion and underlying right lung consolidation.

## 2020-06-15 IMAGING — CT CT ANGIO CHEST
2 of 6 series · 18 of 46 positions shown · IV contrast (APPLIED)
Comparison: [DATE]

CLINICAL DATA: Cough, shortness of breath

EXAM:
CT ANGIOGRAPHY CHEST WITH CONTRAST
TECHNIQUE: Multidetector CT imaging of the chest was performed using the
standard protocol during bolus administration of intravenous
contrast. Multiplanar CT image reconstructions and MIPs were
obtained to evaluate the vascular anatomy.
CONTRAST:  100 mL Omnipaque 350 IV

[Series 5: thins · axial · 0.74mm/px · z∈[-809,-513]mm · 16 of 326 slices shown]
[im 15/326  lung]
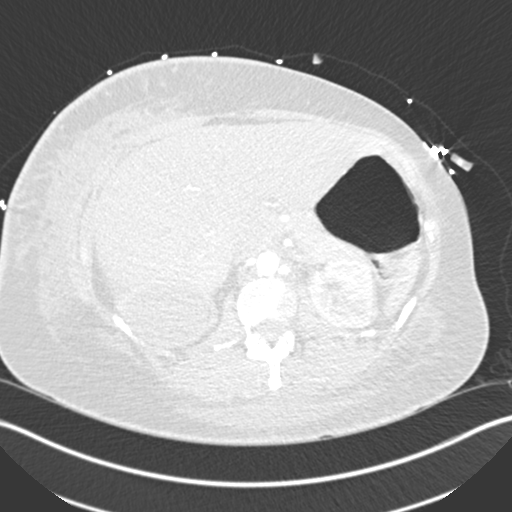
[im 43/326  soft-tissue]
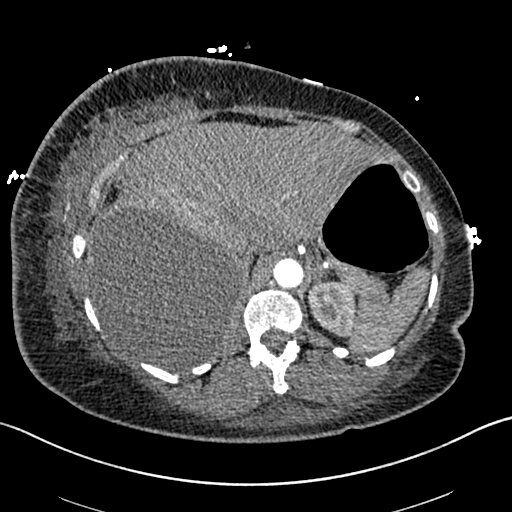
[im 57/326  lung]
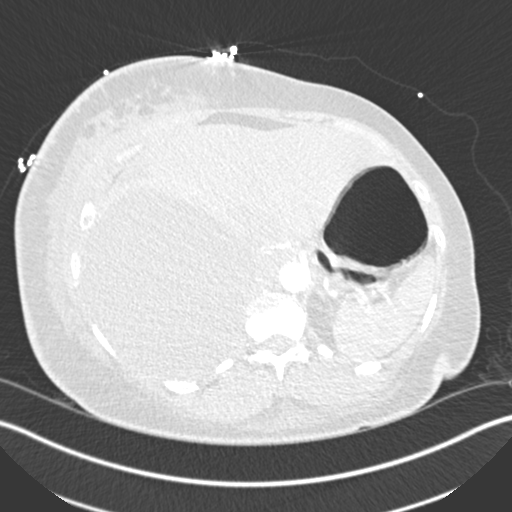
[im 71/326  soft-tissue]
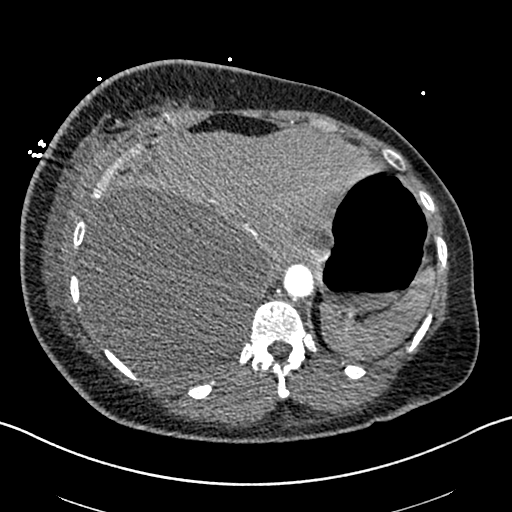
[im 99/326  lung]
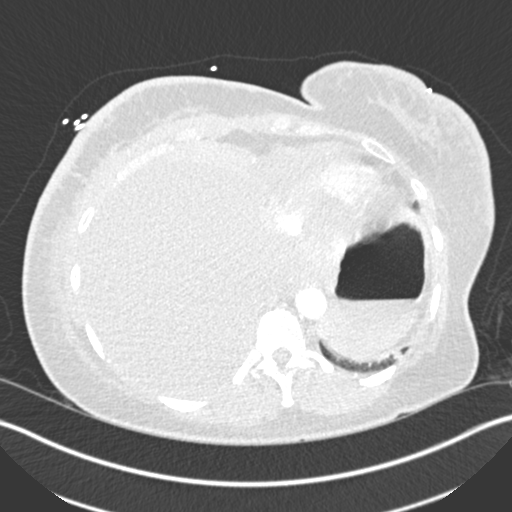
[im 114/326  soft-tissue]
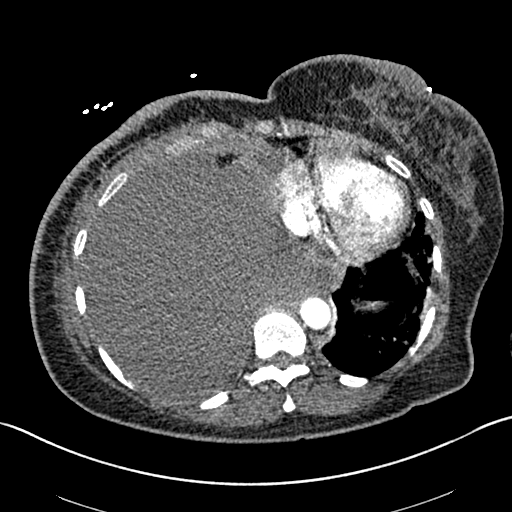
[im 128/326  lung]
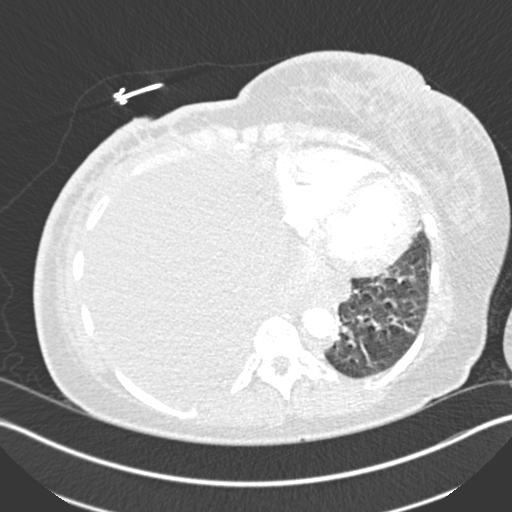
[im 156/326  soft-tissue]
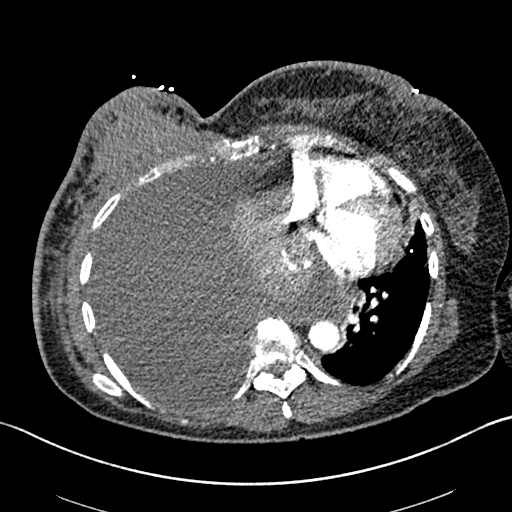
[im 170/326  lung]
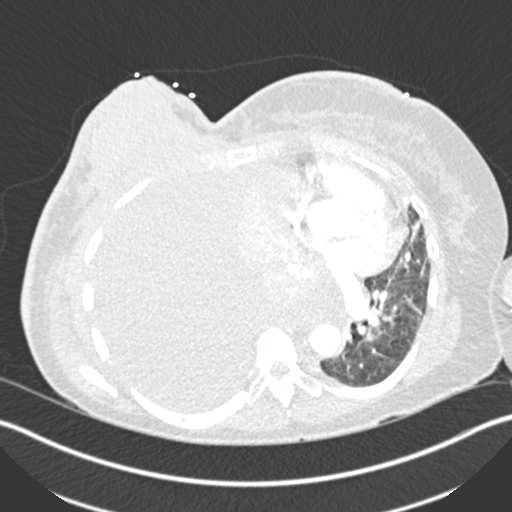
[im 198/326  soft-tissue]
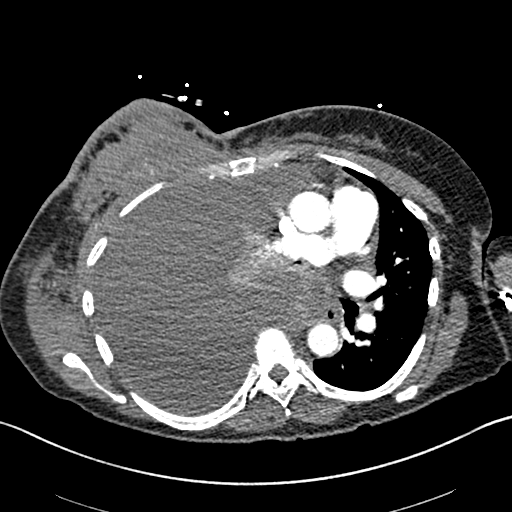
[im 212/326  lung]
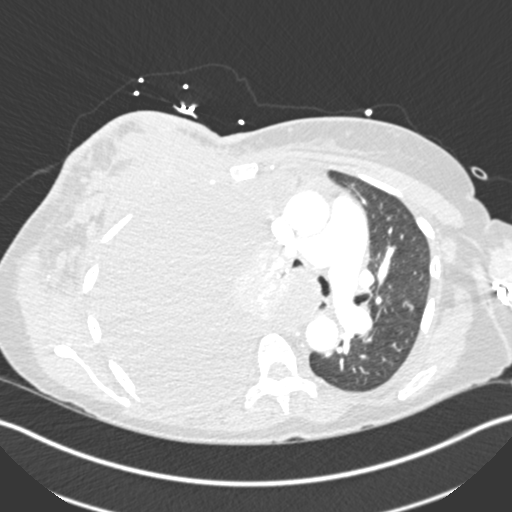
[im 227/326  soft-tissue]
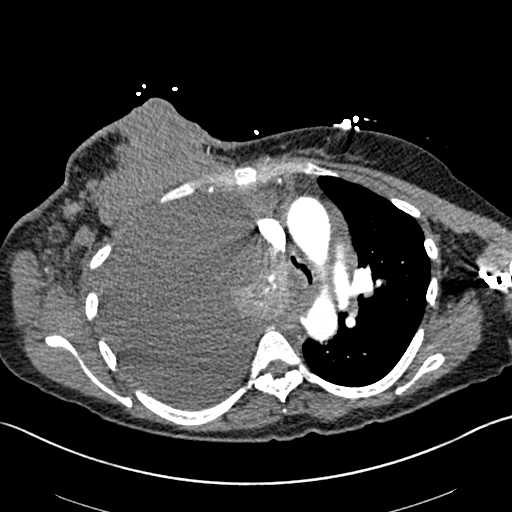
[im 255/326  lung]
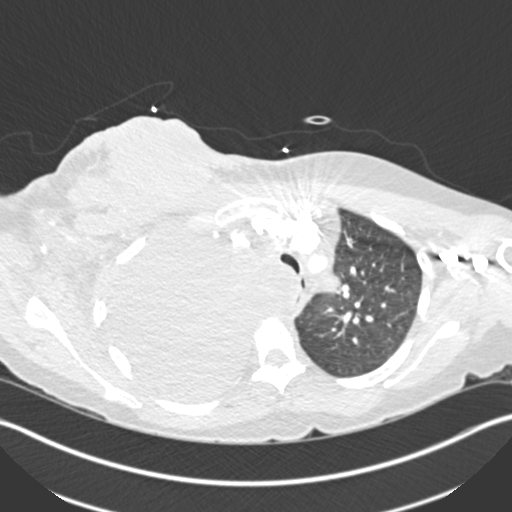
[im 269/326  soft-tissue]
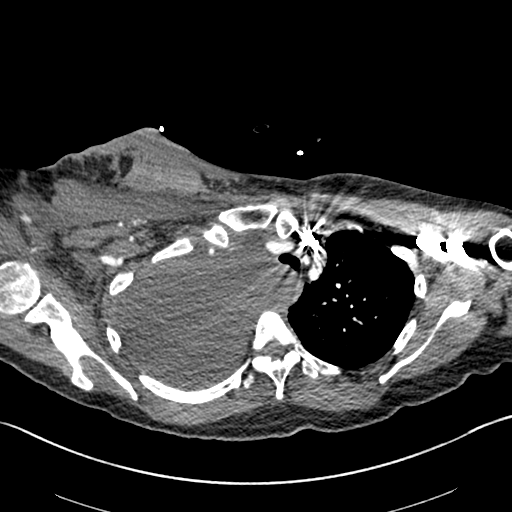
[im 283/326  lung]
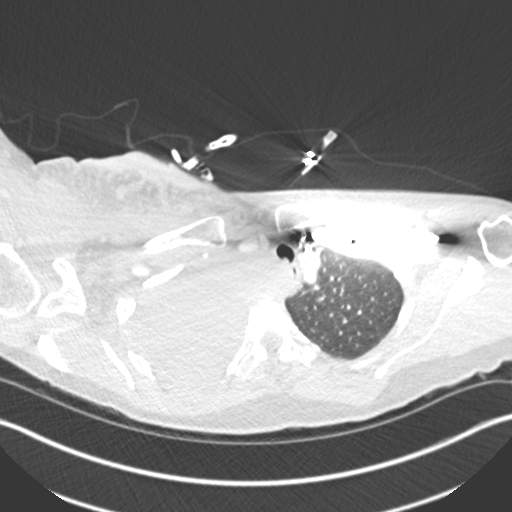
[im 311/326  soft-tissue]
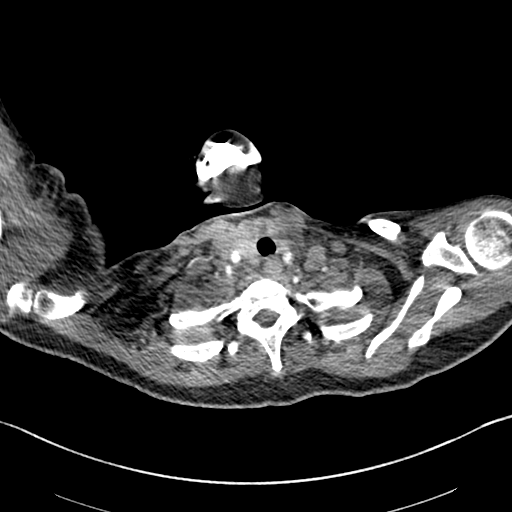

[Series 7: coronal mpr · coronal · 0.64mm/px · 2 of 98 slices shown]
[im 33/98  soft-tissue]
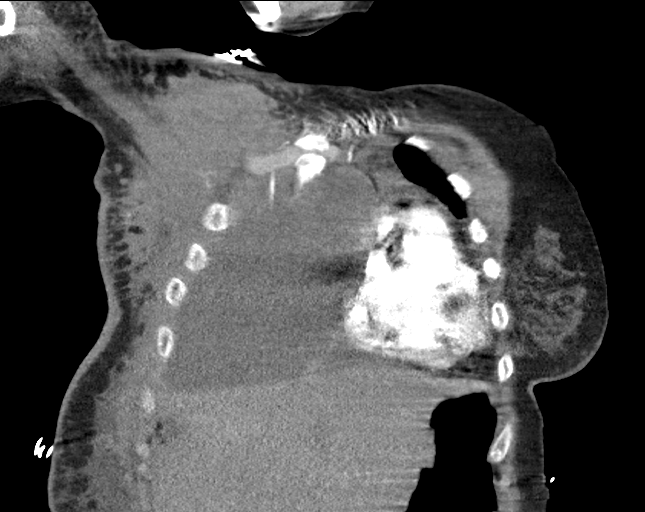
[im 65/98  soft-tissue]
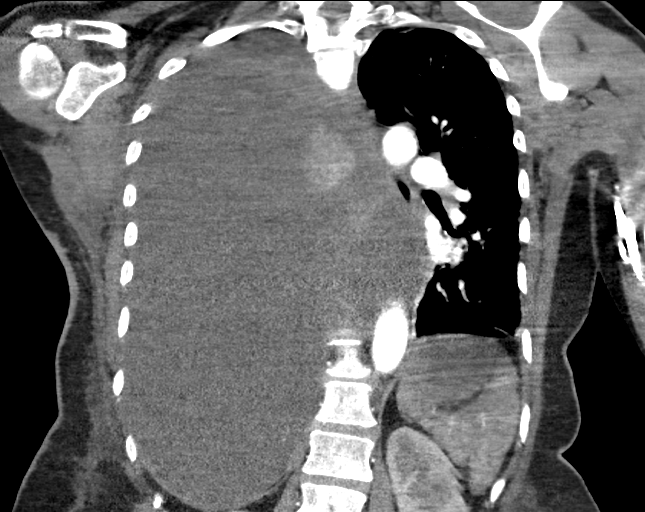

[18 of 46 positions shown; findings below may reference images not displayed]

FINDINGS: Cardiovascular: No filling defects in the pulmonary arteries to
suggest pulmonary emboli. Heart is normal size. Aorta normal
caliber.

Mediastinum/Nodes: No visible mediastinal or hilar adenopathy.
Bilateral axillary adenopathy, right greater than left. Index right
axillary lymph node has a short axis diameter of 13 mm. Left
axillary lymph node has a short axis diameter of 11 mm.

Lungs/Pleura: Large right pleural effusion completely compressed
seen in the right lung which is not aerated. This appears to have
mass effect with shift of the right lung and mediastinal structures
to the left. Areas of atelectasis or scarring in the left lung base.
No effusion on the left.

Upper Abdomen: Imaging into the upper abdomen demonstrates no acute
findings.

Musculoskeletal: Large mass noted in the right breast measuring 8 x
6 cm. Possible separate right breast mass more inferiorly in the
right breast measuring 8 x 4 cm. Skin thickening or edema noted.
Associated right axillary adenopathy. A few ill-defined sclerotic
areas within the thoracic spine could reflect metastases.

Review of the MIP images confirms the above findings.
IMPRESSION: No evidence of pulmonary embolus.

One, possibly 2 separate large masses within the right breast
extending to the skin surface with associated adjacent skin
thickening and right axillary adenopathy. Findings compatible with
right breast cancer.

Large right pleural effusion which appears to have mass effect with
complete collapse/compression of the right lung and shift of
mediastinal structures to the left.

Single enlarged left axillary lymph node.

These results were called by telephone at the time of interpretation
acknowledged these results.

## 2020-06-15 MED ORDER — IOHEXOL 350 MG/ML SOLN
100.0000 mL | Freq: Once | INTRAVENOUS | Status: AC | PRN
  Administered 2020-06-15: 100 mL via INTRAVENOUS

## 2020-06-15 MED ORDER — LIDOCAINE-EPINEPHRINE 2 %-1:100000 IJ SOLN
30.0000 mL | Freq: Once | INTRAMUSCULAR | Status: AC
  Administered 2020-06-15: 30 mL
  Filled 2020-06-15: qty 2

## 2020-06-15 MED ORDER — POTASSIUM CHLORIDE CRYS ER 20 MEQ PO TBCR
40.0000 meq | EXTENDED_RELEASE_TABLET | Freq: Once | ORAL | Status: AC
  Administered 2020-06-16: 40 meq via ORAL
  Filled 2020-06-15: qty 2

## 2020-06-15 MED ORDER — LACTATED RINGERS IV BOLUS (SEPSIS)
1000.0000 mL | Freq: Once | INTRAVENOUS | Status: AC
  Administered 2020-06-15: 1000 mL via INTRAVENOUS

## 2020-06-15 MED ORDER — FENTANYL CITRATE (PF) 100 MCG/2ML IJ SOLN
50.0000 ug | Freq: Once | INTRAMUSCULAR | Status: AC
  Administered 2020-06-15: 50 ug via INTRAVENOUS
  Filled 2020-06-15: qty 2

## 2020-06-15 MED ORDER — LACTATED RINGERS IV SOLN: INTRAVENOUS | Status: DC

## 2020-06-15 MED ORDER — FENTANYL CITRATE (PF) 100 MCG/2ML IJ SOLN
50.0000 ug | Freq: Once | INTRAMUSCULAR | Status: AC
  Administered 2020-06-15: 50 ug via INTRAVENOUS

## 2020-06-15 MED ORDER — SODIUM CHLORIDE 0.9 % IV SOLN
2.0000 g | INTRAVENOUS | Status: DC
  Administered 2020-06-15: 2 g via INTRAVENOUS
  Filled 2020-06-15: qty 20

## 2020-06-15 MED ORDER — LACTATED RINGERS IV BOLUS
1000.0000 mL | Freq: Once | INTRAVENOUS | Status: AC
  Administered 2020-06-15: 1000 mL via INTRAVENOUS

## 2020-06-15 MED ORDER — SODIUM CHLORIDE 0.9 % IV SOLN
500.0000 mg | INTRAVENOUS | Status: DC
  Administered 2020-06-16: 500 mg via INTRAVENOUS
  Filled 2020-06-15: qty 500

## 2020-06-15 NOTE — ED Notes (Addendum)
Pt has a large Softball size mass to Rt breast. Her Rt breast is blackened in color and hard to touch. There is orange peel appearance to the surrounding skin underneath, and the mass itself is weeping with serous fluid. Pt states that she has not seen a doctor about lesion. Pt very deflective when speaking about lesion and wants to discuss the SOB.

## 2020-06-15 NOTE — ED Triage Notes (Signed)
Patient with complaint of cough that started last week. Patient states that she was tested for Covid and it was negative. Patient states that she has some shortness of breath last week but that has become worse.

## 2020-06-15 NOTE — ED Notes (Signed)
Date and time results received: 06/15/20 10:24 PM   Test: Lactic Acid Critical Value: 2.8  Name of Provider Notified: Tamala Julian, MD   Orders Received? Or Actions Taken?: Actions Taken: Provider notified

## 2020-06-15 NOTE — ED Notes (Signed)
Consent signed at this time on paper and placed on chart for scanning.

## 2020-06-15 NOTE — ED Provider Notes (Signed)
[facility] Emergency Department Provider Note  ____________________________________________   First MD Initiated Contact with Patient 06/15/20 2114     (approximate)  I have reviewed the triage vital signs and the nursing notes.   HISTORY  Chief Complaint Cough and Shortness of Breath   HPI Jessica Willis is a 54 y.o. female denies any significant past medical history who presents for assessment of approximately 1 week of cough and worsening shortness of breath.  Patient also notes she has had approximately 6 months of some redness and bumpy swelling over her right breast.  She is not seeking medical attention or evaluation of this.  She denies taking any daily medications.  She denies any chest pain, headache, earache, sore throat, fevers, chills, nausea, vomiting, diarrhea, dysuria, areas of skin discoloration or redness, urinary symptoms, rash, or extremity pain.  She does note she thinks her left lower leg is more swollen than her right and this is, over the last couple of days.  She denies any recent EtOH use, illicit drug use, tobacco abuse.  No clear alleviating aggravating factors aside from some exertion which seems to exacerbate her shortness of breath.         Past Medical History:  Diagnosis Date  . Anemia   . Patient denies medical problems     Patient Active Problem List   Diagnosis Date Noted  . Breast mass 06/15/2020  . Mastitis 06/15/2020  . Pleural effusion, right 06/15/2020  . Intestinal obstruction ([place])   . Leukocytosis   . Hypokalemia 06/24/2015  . Sepsis ([place]) 06/23/2015  . CAP (community acquired pneumonia) 06/23/2015  . Partial small bowel obstruction ([place]) 06/23/2015    Past Surgical History:  Procedure Laterality Date  . TUBAL LIGATION      Prior to Admission medications   Medication Sig Start Date End Date Taking? Authorizing Provider  ferrous sulfate 325 (65 FE) MG tablet Take 1 tablet (325 mg total) by  mouth 2 (two) times daily with a meal. 06/30/15   Gladstone Lighter, MD  penicillin v potassium (VEETID) 500 MG tablet Take 1 tablet (500 mg total) by mouth 4 (four) times daily. 06/30/15   Gladstone Lighter, MD    Allergies Patient has no known allergies.  Family History  Problem Relation Age of Onset  . Diabetes Other   . Hypertension Other     Social History Social History   Tobacco Use  . Smoking status: Never Smoker  . Smokeless tobacco: Never Used  Substance Use Topics  . Alcohol use: Not Currently    Alcohol/week: 1.0 standard drink    Types: 1 Cans of beer per week  . Drug use: No    Review of Systems  Review of Systems  Constitutional: Negative for chills and fever.  HENT: Negative for sore throat.   Eyes: Negative for pain.  Respiratory: Positive for cough and shortness of breath. Negative for stridor.   Cardiovascular: Negative for chest pain.  Gastrointestinal: Negative for vomiting.  Skin: Negative for rash.  Neurological: Negative for seizures, loss of consciousness and headaches.  Psychiatric/Behavioral: Negative for suicidal ideas.  All other systems reviewed and are negative.     ____________________________________________   PHYSICAL EXAM:  VITAL SIGNS: ED Triage Vitals  Enc Vitals Group     BP 06/15/20 2111 (!) 180/107     Pulse Rate 06/15/20 2111 (!) 144     Resp 06/15/20 2111 (!) 22     Temp --  Temp src --      SpO2 06/15/20 2111 94 %     Weight 06/15/20 2114 190 lb (86.2 kg)     Height 06/15/20 2114 5' 10"  (1.778 m)     Head Circumference --      Peak Flow --      Pain Score 06/15/20 2114 0     Pain Loc --      Pain Edu? --      Excl. in [place]? --    Vitals:   06/15/20 2120 06/15/20 2330  BP: (!) 163/109 (!) 151/100  Pulse: (!) 133 (!) 124  Resp: (!) 38 (!) 26  Temp:    SpO2: 97% 97%   Physical Exam Vitals and nursing note reviewed.  Constitutional:      General: She is not in acute distress.    Appearance: She is  well-developed.  HENT:     Head: Normocephalic and atraumatic.     Right Ear: External ear normal.     Left Ear: External ear normal.     Nose: Nose normal.     Mouth/Throat:     Mouth: Mucous membranes are dry.  Eyes:     Conjunctiva/sclera: Conjunctivae normal.  Cardiovascular:     Rate and Rhythm: Regular rhythm. Tachycardia present.     Heart sounds: No murmur heard.   Pulmonary:     Effort: Tachypnea, accessory muscle usage and respiratory distress present.     Breath sounds: Examination of the right-upper field reveals decreased breath sounds and rhonchi. Examination of the right-middle field reveals decreased breath sounds and rhonchi. Examination of the right-lower field reveals decreased breath sounds. Decreased breath sounds and rhonchi present.  Abdominal:     Palpations: Abdomen is soft.     Tenderness: There is no abdominal tenderness.  Musculoskeletal:     Cervical back: Neck supple.     Right lower leg: No edema.     Left lower leg: Edema present.  Skin:    General: Skin is warm and dry.     Capillary Refill: Capillary refill takes 2 to 3 seconds.  Neurological:     Mental Status: She is alert.     GCS: GCS eye subscore is 4. GCS verbal subscore is 5. GCS motor subscore is 6.  Psychiatric:        Mood and Affect: Mood is anxious.     Patient's right breast has significant indurated erythematous tender tissue covering the entire breast and extending up the right chest wall.  Significant deformity at the breast tissue and areola.  ____________________________________________   LABS (all labs ordered are listed, but only abnormal results are displayed)  Labs Reviewed  BLOOD GAS, VENOUS - Abnormal; Notable for the following components:      Result Value   Bicarbonate 28.2 (*)    All other components within normal limits  CBC WITH DIFFERENTIAL/PLATELET - Abnormal; Notable for the following components:   WBC 15.1 (*)    Hemoglobin 11.5 (*)    Platelets 568  (*)    Neutro Abs 12.3 (*)    Monocytes Absolute 1.1 (*)    Abs Immature Granulocytes 0.08 (*)    All other components within normal limits  COMPREHENSIVE METABOLIC PANEL - Abnormal; Notable for the following components:   Potassium 3.3 (*)    Glucose, Bld 146 (*)    BUN 22 (*)    Total Protein 9.0 (*)    AST 43 (*)    Anion gap 17 (*)  All other components within normal limits  FIBRIN DERIVATIVES D-DIMER ([facility] ONLY) - Abnormal; Notable for the following components:   Fibrin derivatives D-dimer ([facility]) 4,795.31 (*)    All other components within normal limits  LACTIC ACID, PLASMA - Abnormal; Notable for the following components:   Lactic Acid, Venous 2.8 (*)    All other components within normal limits  RESPIRATORY PANEL BY RT PCR (FLU A&B, COVID)  CULTURE, BLOOD (ROUTINE X 2)  CULTURE, BLOOD (ROUTINE X 2)  URINE CULTURE  BODY FLUID CULTURE  BRAIN NATRIURETIC PEPTIDE  MAGNESIUM  PROCALCITONIN  LACTIC ACID, PLASMA  PROTIME-INR  APTT  URINALYSIS, COMPLETE (UACMP) WITH MICROSCOPIC  BODY FLUID CELL COUNT WITH DIFFERENTIAL  PROTEIN, PLEURAL OR PERITONEAL FLUID  LACTATE DEHYDROGENASE, PLEURAL OR PERITONEAL FLUID  GLUCOSE, PLEURAL OR PERITONEAL FLUID  AMYLASE, PLEURAL OR PERITONEAL FLUID   TRIGLYCERIDES, BODY FLUIDS  CHOLESTEROL, BODY FLUID  PH, BODY FLUID  ALBUMIN  HEMATOCRIT  CD19 AND CD20, FLOW CYTOMETRY  CYTOLOGY - NON PAP  TROPONIN I (HIGH SENSITIVITY)  TROPONIN I (HIGH SENSITIVITY)   ____________________________________________  EKG  Sinus tachycardia with a ventricular rate of 134, normal axis, unremarkable intervals, nonspecific ST changes in lead I, lead V5, and wondering baseline in V2.  No other clear ischemia or other underlying arrhythmia. ____________________________________________  RADIOLOGY  ED MD interpretation: Near complete opacification of the right hemithorax consistent with a large right-sided pleural effusion and possible  consolidation.  Official radiology report(s): DG Chest 1 View  Result Date: 06/15/2020 CLINICAL DATA:  Short of breath, cough EXAM: CHEST  1 VIEW COMPARISON:  06/29/2015 FINDINGS: 2 frontal views of the chest demonstrate complete opacification of the right hemithorax, with slight mediastinal shift to the left, consistent with large right pleural effusion and underlying lung consolidation. Chronic areas of scarring are seen within the left chest. No pneumothorax. Cardiac silhouette is unremarkable. No acute bony abnormalities. IMPRESSION: 1. Opacification of the right hemithorax consistent with large right pleural effusion and underlying right lung consolidation. Electronically Signed   By: Randa Ngo M.D.   On: 06/15/2020 21:42   CT Angio Chest PE W and/or Wo Contrast  Result Date: 06/15/2020 CLINICAL DATA:  Cough, shortness of breath EXAM: CT ANGIOGRAPHY CHEST WITH CONTRAST TECHNIQUE: Multidetector CT imaging of the chest was performed using the standard protocol during bolus administration of intravenous contrast. Multiplanar CT image reconstructions and MIPs were obtained to evaluate the vascular anatomy. CONTRAST:  100 mL Omnipaque 350 IV COMPARISON:  06/30/2015 FINDINGS: Cardiovascular: No filling defects in the pulmonary arteries to suggest pulmonary emboli. Heart is normal size. Aorta normal caliber. Mediastinum/Nodes: No visible mediastinal or hilar adenopathy. Bilateral axillary adenopathy, right greater than left. Index right axillary lymph node has a short axis diameter of 13 mm. Left axillary lymph node has a short axis diameter of 11 mm. Lungs/Pleura: Large right pleural effusion completely compressed seen in the right lung which is not aerated. This appears to have mass effect with shift of the right lung and mediastinal structures to the left. Areas of atelectasis or scarring in the left lung base. No effusion on the left. Upper Abdomen: Imaging into the upper abdomen demonstrates no  acute findings. Musculoskeletal: Large mass noted in the right breast measuring 8 x 6 cm. Possible separate right breast mass more inferiorly in the right breast measuring 8 x 4 cm. Skin thickening or edema noted. Associated right axillary adenopathy. A few ill-defined sclerotic areas within the thoracic spine could reflect metastases. Review of the  MIP images confirms the above findings. IMPRESSION: No evidence of pulmonary embolus. One, possibly 2 separate large masses within the right breast extending to the skin surface with associated adjacent skin thickening and right axillary adenopathy. Findings compatible with right breast cancer. Large right pleural effusion which appears to have mass effect with complete collapse/compression of the right lung and shift of mediastinal structures to the left. Single enlarged left axillary lymph node. These results were called by telephone at the time of interpretation on 06/15/2020 at 10:43 pm to provider [facility] , who verbally acknowledged these results. Electronically Signed   By: Rolm Baptise M.D.   On: 06/15/2020 22:46   DG Chest Portable 1 View  Result Date: 06/15/2020 CLINICAL DATA:  Right chest tube placement. EXAM: PORTABLE CHEST 1 VIEW COMPARISON:  Radiograph and CT earlier today. FINDINGS: Placement of right pigtail catheter. There is persistent complete opacification of the right hemithorax. There may be slight diminished mediastinal shift to the left. No visualized pneumothorax. No focal abnormality in the left lung. IMPRESSION: Placement of right pigtail catheter with persistent complete opacification of the right hemithorax. There may be slight diminished mediastinal shift to the left. No visualized pneumothorax. Electronically Signed   By: Keith Rake M.D.   On: 06/15/2020 23:40    ____________________________________________   PROCEDURES  Procedure(s) performed (including Critical Care):  CHEST TUBE INSERTION  Date/Time: 06/15/2020  11:36 PM Performed by: Lucrezia Starch, MD Authorized by: Lucrezia Starch, MD   Consent:    Consent obtained:  Written and verbal   Consent given by:  Patient   Risks discussed:  Bleeding, incomplete drainage, infection, damage to surrounding structures, nerve damage and pain   Alternatives discussed:  No treatment and delayed treatment Pre-procedure details:    Skin preparation:  Betadine Anesthesia (see MAR for exact dosages):    Anesthesia method:  Local infiltration   Local anesthetic:  Lidocaine 1% w/o epi and lidocaine 1% WITH epi Procedure details:    Placement location:  R lateral   Tube size (Fr):  Minicatheter   Ultrasound guidance: no     Tension pneumothorax: no     Drainage characteristics:  Bloody   Suture material:  0 silk   Dressing:  Xeroform gauze Post-procedure details:    Post-insertion x-ray findings: tube in good position     Patient tolerance of procedure:  Tolerated well, no immediate complications  .Critical Care Performed by: Lucrezia Starch, MD Authorized by: Lucrezia Starch, MD   Critical care provider statement:    Critical care time (minutes):  75   Critical care was necessary to treat or prevent imminent or life-threatening deterioration of the following conditions:  Respiratory failure, shock and dehydration   Critical care was time spent personally by me on the following activities:  Discussions with consultants, evaluation of patient's response to treatment, examination of patient, ordering and performing treatments and interventions, ordering and review of laboratory studies, ordering and review of radiographic studies, pulse oximetry, re-evaluation of patient's condition, obtaining history from patient or surrogate, review of old charts and development of treatment plan with patient or surrogate     ____________________________________________   INITIAL IMPRESSION / ASSESSMENT AND PLAN / ED COURSE        Patient presents with a  stage exam for assessment of approximately 1 week of worsening shortness of breath and cough.  On arrival patient was noted in triage to have an SPO2 saturation of 85% on room air and was  placed on 4 L with improvement of 94%.  She is noted to be hypertensive with a BP of 180/107, tachycardic to 144, tachypneic to 22, and afebrile.  Differential includes but is not limited to pneumonia, pneumothorax, symptomatic pleural effusion secondary to undiagnosed breast cancer, PE, ACS, arrhythmia, anemia, metabolic derangements, and sepsis from chest wall infection.  Patient has nonspecific ECG changes she denies any chest pain and given initial troponin is for a low suspicion for ACS at this time.  No significant arrhythmias identified on ECG.  Magnesium is normal.  VBG shows no evidence of hypercarbic respiratory failure.  CBC remarkable for WBC count of 15.1 and hemoglobin of 11.5 which is elevated from 4 years ago of 7.4.  Patient also has elevated platelets at 568 although patient's platelets were 671 4 years ago.  CMP remarkable for mild hypokalemia with a K of 3.3 and a T protein of 9 and anion gap of 17 with normal bicarb and no other significant electrolyte or metabolic derangements.  Given tachycardia, tachypnea, and elevated white blood cell count I did obtain blood cultures and ordered IV antibiotics to cover for respiratory source of possible sepsis with the patient.  Also ordered some IV fluids for the patient although she is currently hypertensive and I believe driving source for her symptoms is likely large right pleural effusion versus PE versus pneumonia.  CT obtained shows no evidence of PE but does show a large right-sided pleural effusion as a near complete compression of the right lung.  I suspect this is likely causing patient's hypoxic respiratory failure and given tachycardia tachypnea and respiratory distress pigtail chest tube placed per procedure note above.  Approximately 500 cc of  blood-tinged fluid was removed.  Cytology studies and infectious studies were sent.  Following this procedure patient states she felt much improved and her oxygen requirement was noted decreased from 4 L to 2 L.  In addition her heart rate decreased from the 140s to the 120s.  The for further large-volume drainage to inpatient team.  Given multiple SIRS criteria met patient was made code sepsis and given multiple IV fluid boluses in addition to antibiotics.  I will plan to admit to medicine service for further evaluation management.  ____________________________________________   FINAL CLINICAL IMPRESSION(S) / ED DIAGNOSES  Final diagnoses:  Acute respiratory failure with hypoxia (HCC)  Pleural effusion on right  Breast mass, right  Leukocytosis, unspecified type  Hypokalemia    Medications  cefTRIAXone (ROCEPHIN) 2 g in sodium chloride 0.9 % 100 mL IVPB (0 g Intravenous Stopped 06/15/20 2337)  azithromycin (ZITHROMAX) 500 mg in sodium chloride 0.9 % 250 mL IVPB (has no administration in time range)  potassium chloride SA (KLOR-CON) CR tablet 40 mEq (has no administration in time range)  lactated ringers infusion (has no administration in time range)  fentaNYL (SUBLIMAZE) injection 50 mcg (has no administration in time range)  lactated ringers bolus 1,000 mL (1,000 mLs Intravenous New Bag/Given 06/15/20 2218)  iohexol (OMNIPAQUE) 350 MG/ML injection 100 mL (100 mLs Intravenous Contrast Given 06/15/20 2234)  lactated ringers bolus 1,000 mL (1,000 mLs Intravenous New Bag/Given 06/15/20 2244)  lidocaine-EPINEPHrine (XYLOCAINE W/EPI) 2 %-1:100000 (with pres) injection 30 mL (30 mLs Infiltration Given by Other 06/15/20 2304)  fentaNYL (SUBLIMAZE) injection 50 mcg (50 mcg Intravenous Given 06/15/20 2304)     ED Discharge Orders    None       Note:  This document was prepared using Dragon voice recognition software and may include  unintentional dictation errors.   Lucrezia Starch,  MD 06/15/20 [phone]

## 2020-06-15 NOTE — H&P (Signed)
History and Physical   Jessica Willis KVQ:[id number] DOB: 06/27/1966 DOA: 06/15/2020  Referring MD/NP/PA: Dr. Hulan Saas  PCP: Patient, No Pcp Per   Outpatient Specialists: None  Patient coming from: Home  Chief Complaint: Shortness of breath and cough  HPI: Jessica Willis is a 54 y.o. female with medical history significant of anemia, appears to have medication noncompliance who has apparently had bone swelling of the right breast for at least 6 months but did not seek any medical attention.  Came in today with 1 week of progressive cough shortness of breath.  Patient also noted some weakness.  Patient came to the ER where she was evaluated.  She has a large right breast mass with a duplicate mass above the breast which looks necrotic with bullae and purulent discharge.  She additionally had CT that shows mass right-sided pleural effusion with mass-effect and shifting of the midline.  Near complete collapse of the right lung.  Patient also appears to have significant breast cancer.  She is evasive when it comes to history given.  Does not appear to be interested in giving much history.  She denies any family history of cancers including breast cancer.  Denied any prior diagnosis of any breast lesion.  Has not had any mammograms.  Patient being admitted now after thoracentesis in the ER with chest tube insertion.  Her right breast appears to have cellulitic changes with a purulent discharge leading to sepsis.  She appears to have malignant pleural effusion with right breast cancer..  ED Course: Temperature 98.2 blood pressure 180/107 pulse 144 respirate 38 oxygen sat 94% on room air.  White count 15.1 hemoglobin 11.4 and platelets count of 565.  Sodium is 143 potassium 3.3 chloride 101 CO2 25 BUN 22 and creatinine was 0.61.  Lactic acid 2.8 glucose 146 and calcium 9.5.  CT angiogram of the chest shows no evidence of PE.  There was one possibily 2 separate large mass seen within the right  breast extending to the skin surface was associated skin thickening or right axillary adenopathy.  Findings are consistent with right breast cancer.  Is also a large right pleural effusion with mass-effect with complete collapse of the right lung and shift of mediastinal structures the left.  There is a single enlarged left axillary lymph node.  Thoracentesis was done with insertion of pleural catheter in the ER.  Patient being admitted to the medical service for evaluation.  Review of Systems: As per HPI otherwise 10 point review of systems negative.    Past Medical History:  Diagnosis Date  . Anemia   . Patient denies medical problems     Past Surgical History:  Procedure Laterality Date  . TUBAL LIGATION       reports that she has never smoked. She has never used smokeless tobacco. She reports previous alcohol use of about 1.0 standard drink of alcohol per week. She reports that she does not use drugs.  No Known Allergies  Family History  Problem Relation Age of Onset  . Diabetes Other   . Hypertension Other      Prior to Admission medications   Medication Sig Start Date End Date Taking? Authorizing Provider  ferrous sulfate 325 (65 FE) MG tablet Take 1 tablet (325 mg total) by mouth 2 (two) times daily with a meal. 06/30/15   Gladstone Lighter, MD  penicillin v potassium (VEETID) 500 MG tablet Take 1 tablet (500 mg total) by mouth 4 (four) times daily. 06/30/15   Kalisetti,  Hart Rochester, MD    Physical Exam: Vitals:   06/15/20 2114 06/15/20 2117 06/15/20 2120 06/15/20 2330  BP:   (!) 163/109 (!) 151/100  Pulse:   (!) 133 (!) 124  Resp:   (!) 38 (!) 26  Temp:  98.2 F (36.8 C)    TempSrc:  Oral    SpO2:  100% 97% 97%  Weight: 86.2 kg     Height: 5\' 10"  (1.778 m)         Constitutional: Withdrawn, mildly cachectic Vitals:   06/15/20 2114 06/15/20 2117 06/15/20 2120 06/15/20 2330  BP:   (!) 163/109 (!) 151/100  Pulse:   (!) 133 (!) 124  Resp:   (!) 38 (!) 26  Temp:   98.2 F (36.8 C)    TempSrc:  Oral    SpO2:  100% 97% 97%  Weight: 86.2 kg     Height: 5\' 10"  (1.778 m)      Eyes: PERRL, lids and conjunctivae normal ENMT: Mucous membranes are dry. Posterior pharynx clear of any exudate or lesions.Normal dentition.  Neck: normal, supple, no masses, no thyromegaly Respiratory: Decreased breath sounds bilaterally right more than left with egophony and dullness, also has crackles.  Increased respiratory effort. No accessory muscle use.  Right breast hardening with masses additional mass above the right breast with crusted skin bullae, purulent discharges red and warm to touch Cardiovascular: Sinus tachycardia, no murmurs / rubs / gallops. No extremity edema. 2+ pedal pulses. No carotid bruits.  Abdomen: no tenderness, no masses palpated. No hepatosplenomegaly. Bowel sounds positive.  Musculoskeletal: no clubbing / cyanosis. No joint deformity upper and lower extremities. Good ROM, no contractures. Normal muscle tone.  Skin: no rashes, lesions, ulcers. No induration Neurologic: CN 2-12 grossly intact. Sensation intact, DTR normal. Strength 5/5 in all 4.  Psychiatric: Normal judgment and insight. Alert and oriented x 3.  Depressed mood, evasive to questions.     Labs on Admission: I have personally reviewed following labs and imaging studies  CBC: Recent Labs  Lab 06/15/20 2125  WBC 15.1*  NEUTROABS 12.3*  HGB 11.5*  HCT 36.3  MCV 82.1  PLT 326*   Basic Metabolic Panel: Recent Labs  Lab 06/15/20 2125  NA 143  K 3.3*  CL 101  CO2 25  GLUCOSE 146*  BUN 22*  CREATININE 0.61  CALCIUM 9.5  MG 2.2   GFR: Estimated Creatinine Clearance: 94.8 mL/min (by C-G formula based on SCr of 0.61 mg/dL). Liver Function Tests: Recent Labs  Lab 06/15/20 2125  AST 43*  ALT 29  ALKPHOS 107  BILITOT 1.0  PROT 9.0*  ALBUMIN 4.0   No results for input(s): LIPASE, AMYLASE in the last 168 hours. No results for input(s): AMMONIA in the last 168  hours. Coagulation Profile: No results for input(s): INR, PROTIME in the last 168 hours. Cardiac Enzymes: No results for input(s): CKTOTAL, CKMB, CKMBINDEX, TROPONINI in the last 168 hours. BNP (last 3 results) No results for input(s): PROBNP in the last 8760 hours. HbA1C: No results for input(s): HGBA1C in the last 72 hours. CBG: No results for input(s): GLUCAP in the last 168 hours. Lipid Profile: No results for input(s): CHOL, HDL, LDLCALC, TRIG, CHOLHDL, LDLDIRECT in the last 72 hours. Thyroid Function Tests: No results for input(s): TSH, T4TOTAL, FREET4, T3FREE, THYROIDAB in the last 72 hours. Anemia Panel: No results for input(s): VITAMINB12, FOLATE, FERRITIN, TIBC, IRON, RETICCTPCT in the last 72 hours. Urine analysis:    Component Value Date/Time  COLORURINE YELLOW (A) 06/23/2015 1952   APPEARANCEUR CLEAR (A) 06/23/2015 1952   LABSPEC 1.021 06/23/2015 1952   PHURINE 6.0 06/23/2015 1952   GLUCOSEU NEGATIVE 06/23/2015 [place] NEGATIVE 06/23/2015 [place] NEGATIVE 06/23/2015 1952   KETONESUR 1+ (A) 06/23/2015 1952   PROTEINUR 100 (A) 06/23/2015 1952   NITRITE NEGATIVE 06/23/2015 1952   LEUKOCYTESUR NEGATIVE 06/23/2015 1952   Sepsis Labs: @LABRCNTIP (procalcitonin:4,lacticidven:4) ) Recent Results (from the past 240 hour(s))  Respiratory Panel by RT PCR (Flu A&B, Covid) - Nasopharyngeal Swab     Status: None   Collection Time: 06/15/20  9:25 PM   Specimen: Nasopharyngeal Swab  Result Value Ref Range Status   SARS Coronavirus 2 by RT PCR NEGATIVE NEGATIVE Final    Comment: (NOTE) SARS-CoV-2 target nucleic acids are NOT DETECTED.  The SARS-CoV-2 RNA is generally detectable in upper respiratoy specimens during the acute phase of infection. The lowest concentration of SARS-CoV-2 viral copies this assay can detect is 131 copies/mL. A negative result does not preclude SARS-Cov-2 infection and should not be used as the sole basis for treatment or other  patient management decisions. A negative result may occur with  improper specimen collection/handling, submission of specimen other than nasopharyngeal swab, presence of viral mutation(s) within the areas targeted by this assay, and inadequate number of viral copies (<131 copies/mL). A negative result must be combined with clinical observations, patient history, and epidemiological information. The expected result is Negative.  Fact Sheet for Patients:  PinkCheek.be  Fact Sheet for Healthcare Providers:  GravelBags.it  This test is no t yet approved or cleared by the [place] FDA and  has been authorized for detection and/or diagnosis of SARS-CoV-2 by FDA under an Emergency Use Authorization (EUA). This EUA will remain  in effect (meaning this test can be used) for the duration of the COVID-19 declaration under Section 564(b)(1) of the Act, 21 U.S.C. section 360bbb-3(b)(1), unless the authorization is terminated or revoked sooner.     Influenza A by PCR NEGATIVE NEGATIVE Final   Influenza B by PCR NEGATIVE NEGATIVE Final    Comment: (NOTE) The Xpert Xpress SARS-CoV-2/FLU/RSV assay is intended as an aid in  the diagnosis of influenza from Nasopharyngeal swab specimens and  should not be used as a sole basis for treatment. Nasal washings and  aspirates are unacceptable for Xpert Xpress SARS-CoV-2/FLU/RSV  testing.  Fact Sheet for Patients: PinkCheek.be  Fact Sheet for Healthcare Providers: GravelBags.it  This test is not yet approved or cleared by the [place] FDA and  has been authorized for detection and/or diagnosis of SARS-CoV-2 by  FDA under an Emergency Use Authorization (EUA). This EUA will remain  in effect (meaning this test can be used) for the duration of the  Covid-19 declaration under Section 564(b)(1) of the Act, 21  U.S.C. section  360bbb-3(b)(1), unless the authorization is  terminated or revoked. Performed at [facility], [address] [place], [place] [address]      Radiological Exams on Admission: DG Chest 1 View  Result Date: 06/15/2020 CLINICAL DATA:  Short of breath, cough EXAM: CHEST  1 VIEW COMPARISON:  06/29/2015 FINDINGS: 2 frontal views of the chest demonstrate complete opacification of the right hemithorax, with slight mediastinal shift to the left, consistent with large right pleural effusion and underlying lung consolidation. Chronic areas of scarring are seen within the left chest. No pneumothorax. Cardiac silhouette is unremarkable. No acute bony abnormalities. IMPRESSION: 1. Opacification of the right hemithorax consistent  with large right pleural effusion and underlying right lung consolidation. Electronically Signed   By: Randa Ngo M.D.   On: 06/15/2020 21:42   CT Angio Chest PE W and/or Wo Contrast  Result Date: 06/15/2020 CLINICAL DATA:  Cough, shortness of breath EXAM: CT ANGIOGRAPHY CHEST WITH CONTRAST TECHNIQUE: Multidetector CT imaging of the chest was performed using the standard protocol during bolus administration of intravenous contrast. Multiplanar CT image reconstructions and MIPs were obtained to evaluate the vascular anatomy. CONTRAST:  100 mL Omnipaque 350 IV COMPARISON:  06/30/2015 FINDINGS: Cardiovascular: No filling defects in the pulmonary arteries to suggest pulmonary emboli. Heart is normal size. Aorta normal caliber. Mediastinum/Nodes: No visible mediastinal or hilar adenopathy. Bilateral axillary adenopathy, right greater than left. Index right axillary lymph node has a short axis diameter of 13 mm. Left axillary lymph node has a short axis diameter of 11 mm. Lungs/Pleura: Large right pleural effusion completely compressed seen in the right lung which is not aerated. This appears to have mass effect with shift of the right lung and mediastinal structures to the left.  Areas of atelectasis or scarring in the left lung base. No effusion on the left. Upper Abdomen: Imaging into the upper abdomen demonstrates no acute findings. Musculoskeletal: Large mass noted in the right breast measuring 8 x 6 cm. Possible separate right breast mass more inferiorly in the right breast measuring 8 x 4 cm. Skin thickening or edema noted. Associated right axillary adenopathy. A few ill-defined sclerotic areas within the thoracic spine could reflect metastases. Review of the MIP images confirms the above findings. IMPRESSION: No evidence of pulmonary embolus. One, possibly 2 separate large masses within the right breast extending to the skin surface with associated adjacent skin thickening and right axillary adenopathy. Findings compatible with right breast cancer. Large right pleural effusion which appears to have mass effect with complete collapse/compression of the right lung and shift of mediastinal structures to the left. Single enlarged left axillary lymph node. These results were called by telephone at the time of interpretation on 06/15/2020 at 10:43 pm to provider [facility] , who verbally acknowledged these results. Electronically Signed   By: Rolm Baptise M.D.   On: 06/15/2020 22:46   DG Chest Portable 1 View  Result Date: 06/15/2020 CLINICAL DATA:  Right chest tube placement. EXAM: PORTABLE CHEST 1 VIEW COMPARISON:  Radiograph and CT earlier today. FINDINGS: Placement of right pigtail catheter. There is persistent complete opacification of the right hemithorax. There may be slight diminished mediastinal shift to the left. No visualized pneumothorax. No focal abnormality in the left lung. IMPRESSION: Placement of right pigtail catheter with persistent complete opacification of the right hemithorax. There may be slight diminished mediastinal shift to the left. No visualized pneumothorax. Electronically Signed   By: Keith Rake M.D.   On: 06/15/2020 23:40    EKG: Independently  reviewed.  Sinus tachycardia otherwise no significant changes.  Assessment/Plan Principal Problem:   Sepsis ([place]) Active Problems:   Hypokalemia   Breast mass   Mastitis   Pleural effusion, right     #1 sepsis: Most likely secondary to infected right breast cancer.  Patient has evidence of cellulitis of the skin overlying it with purulent discharge.  Initiate patient on sepsis protocol while working out other modalities.  Monitor closely and follow culture results.  #2  Right sided pleural effusion: Most likely malignant effusion.  Bedside thoracentesis done with more than 500 cc of serosanguineous fluid removed.  Sent to the lab for  evaluation.  Catheter is left in place for further removal.  Follow closely.  #3 right breast cancer: Most likely metastatic and advanced.  Surgical consult in the morning for more biopsy.  Will follow pleural fluid evaluation for cytology as well.  #4 mastitis: Overlying her breast cancer.  Most likely responsible for her sepsis.  Continue Vanco and Rocephin as above.  #5 hypokalemia: Replete potassium.   DVT prophylaxis: Lovenox Code Status: Full code Family Communication: No family at bedside Disposition Plan: To be determined Consults called: None tonight but need surgical consult in the morning with possible oncology consult Admission status: Inpatient  Severity of Illness: The appropriate patient status for this patient is INPATIENT. Inpatient status is judged to be reasonable and necessary in order to provide the required intensity of service to ensure the patient's safety. The patient's presenting symptoms, physical exam findings, and initial radiographic and laboratory data in the context of their chronic comorbidities is felt to place them at high risk for further clinical deterioration. Furthermore, it is not anticipated that the patient will be medically stable for discharge from the hospital within 2 midnights of admission. The following  factors support the patient status of inpatient.   " The patient's presenting symptoms include shortness of breath. " The worrisome physical exam findings include large breast masses with evidence of cellulitis. " The initial radiographic and laboratory data are worrisome because of evidence of sepsis with large right pleural effusion. " The chronic co-morbidities include none.   * I certify that at the point of admission it is my clinical judgment that the patient will require inpatient hospital care spanning beyond 2 midnights from the point of admission due to high intensity of service, high risk for further deterioration and high frequency of surveillance required.Barbette Merino MD [facility] Pager [phone]  If 7PM-7AM, please contact night-coverage [email or web address] Password [facility]  06/15/2020, 11:45 PM

## 2020-06-16 ENCOUNTER — Inpatient Hospital Stay: Payer: Medicaid Other

## 2020-06-16 DIAGNOSIS — N61 Mastitis without abscess: Secondary | ICD-10-CM

## 2020-06-16 DIAGNOSIS — A419 Sepsis, unspecified organism: Secondary | ICD-10-CM

## 2020-06-16 DIAGNOSIS — Z9689 Presence of other specified functional implants: Secondary | ICD-10-CM

## 2020-06-16 DIAGNOSIS — J9601 Acute respiratory failure with hypoxia: Secondary | ICD-10-CM | POA: Insufficient documentation

## 2020-06-16 DIAGNOSIS — C50811 Malignant neoplasm of overlapping sites of right female breast: Secondary | ICD-10-CM

## 2020-06-16 DIAGNOSIS — J942 Hemothorax: Secondary | ICD-10-CM

## 2020-06-16 DIAGNOSIS — J918 Pleural effusion in other conditions classified elsewhere: Secondary | ICD-10-CM

## 2020-06-16 DIAGNOSIS — J90 Pleural effusion, not elsewhere classified: Secondary | ICD-10-CM

## 2020-06-16 DIAGNOSIS — N6310 Unspecified lump in the right breast, unspecified quadrant: Secondary | ICD-10-CM

## 2020-06-16 DIAGNOSIS — N6315 Unspecified lump in the right breast, overlapping quadrants: Secondary | ICD-10-CM

## 2020-06-16 LAB — LACTIC ACID, PLASMA
Lactic Acid, Venous: 2.5 mmol/L (ref 0.5–1.9)
Lactic Acid, Venous: 2.3 mmol/L (ref 0.5–1.9)

## 2020-06-16 LAB — HEMOGLOBIN AND HEMATOCRIT, BLOOD
HCT: 44.3 % (ref 36.0–46.0)
HCT: 46 % (ref 36.0–46.0)
Hemoglobin: 14 g/dL (ref 12.0–15.0)
Hemoglobin: 14.6 g/dL (ref 12.0–15.0)

## 2020-06-16 LAB — ALBUMIN: Albumin: 3.9 g/dL (ref 3.5–5.0)

## 2020-06-16 LAB — BODY FLUID CELL COUNT WITH DIFFERENTIAL
Eos, Fluid: 1 %
Lymphs, Fluid: 44 %
Monocyte-Macrophage-Serous Fluid: 22 % — ABNORMAL LOW (ref 50–90)
Neutrophil Count, Fluid: 33 % — ABNORMAL HIGH (ref 0–25)
Total Nucleated Cell Count, Fluid: 2252 cu mm — ABNORMAL HIGH (ref 0–1000)

## 2020-06-16 LAB — COMPREHENSIVE METABOLIC PANEL
ALT: 27 U/L (ref 0–44)
AST: 38 U/L (ref 15–41)
Albumin: 3.9 g/dL (ref 3.5–5.0)
Alkaline Phosphatase: 102 U/L (ref 38–126)
Anion gap: 13 (ref 5–15)
BUN: 20 mg/dL (ref 6–20)
CO2: 27 mmol/L (ref 22–32)
Calcium: 9.1 mg/dL (ref 8.9–10.3)
Chloride: 100 mmol/L (ref 98–111)
Creatinine, Ser: 0.56 mg/dL (ref 0.44–1.00)
GFR, Estimated: >60 mL/min (ref >60)
Glucose, Bld: 124 mg/dL — ABNORMAL HIGH (ref 70–99)
Potassium: 3.3 mmol/L — ABNORMAL LOW (ref 3.5–5.1)
Sodium: 140 mmol/L (ref 135–145)
Total Bilirubin: 0.8 mg/dL (ref 0.3–1.2)
Total Protein: 8.8 g/dL — ABNORMAL HIGH (ref 6.5–8.1)

## 2020-06-16 LAB — PROTEIN, PLEURAL OR PERITONEAL FLUID: Total protein, fluid: 5.6 g/dL

## 2020-06-16 LAB — URINALYSIS, COMPLETE (UACMP) WITH MICROSCOPIC
Bilirubin Urine: NEGATIVE
Glucose, UA: NEGATIVE mg/dL
Hgb urine dipstick: NEGATIVE
Ketones, ur: 5 mg/dL — AB
Leukocytes,Ua: NEGATIVE
Nitrite: NEGATIVE
Protein, ur: NEGATIVE mg/dL
Specific Gravity, Urine: >1.046 — ABNORMAL HIGH (ref 1.005–1.030)
pH: 5 (ref 5.0–8.0)

## 2020-06-16 LAB — GLUCOSE, PLEURAL OR PERITONEAL FLUID: Glucose, Fluid: 48 mg/dL

## 2020-06-16 LAB — PROTIME-INR
INR: 1.2 (ref 0.8–1.2)
Prothrombin Time: 15.1 seconds (ref 11.4–15.2)

## 2020-06-16 LAB — TROPONIN I (HIGH SENSITIVITY): Troponin I (High Sensitivity): 7 ng/L (ref <18)

## 2020-06-16 LAB — CBC
HCT: 33.5 % — ABNORMAL LOW (ref 36.0–46.0)
Hemoglobin: 10.3 g/dL — ABNORMAL LOW (ref 12.0–15.0)
MCH: 25.6 pg — ABNORMAL LOW (ref 26.0–34.0)
MCHC: 30.7 g/dL (ref 30.0–36.0)
MCV: 83.1 fL (ref 80.0–100.0)
Platelets: 465 10*3/uL — ABNORMAL HIGH (ref 150–400)
RBC: 4.03 MIL/uL (ref 3.87–5.11)
RDW: 12.7 % (ref 11.5–15.5)
WBC: 13 10*3/uL — ABNORMAL HIGH (ref 4.0–10.5)
nRBC: 0 % (ref 0.0–0.2)

## 2020-06-16 LAB — AMYLASE, PLEURAL OR PERITONEAL FLUID: Amylase, Fluid: 64 U/L

## 2020-06-16 LAB — CORTISOL-AM, BLOOD: Cortisol - AM: 35.5 ug/dL — ABNORMAL HIGH (ref 6.7–22.6)

## 2020-06-16 LAB — LACTATE DEHYDROGENASE, PLEURAL OR PERITONEAL FLUID: LD, Fluid: 678 U/L — ABNORMAL HIGH (ref 3–23)

## 2020-06-16 LAB — PROCALCITONIN: Procalcitonin: 21.52 ng/mL

## 2020-06-16 LAB — HIV ANTIBODY (ROUTINE TESTING W REFLEX): HIV Screen 4th Generation wRfx: NONREACTIVE

## 2020-06-16 LAB — APTT: aPTT: 34 seconds (ref 24–36)

## 2020-06-16 IMAGING — DX DG CHEST 1V
1 series · 1 of 1 positions shown · non-contrast
Comparison: Same day chest radiograph.

CLINICAL DATA: Hemothorax on the right.

EXAM:
CHEST  1 VIEW

[chest ap]
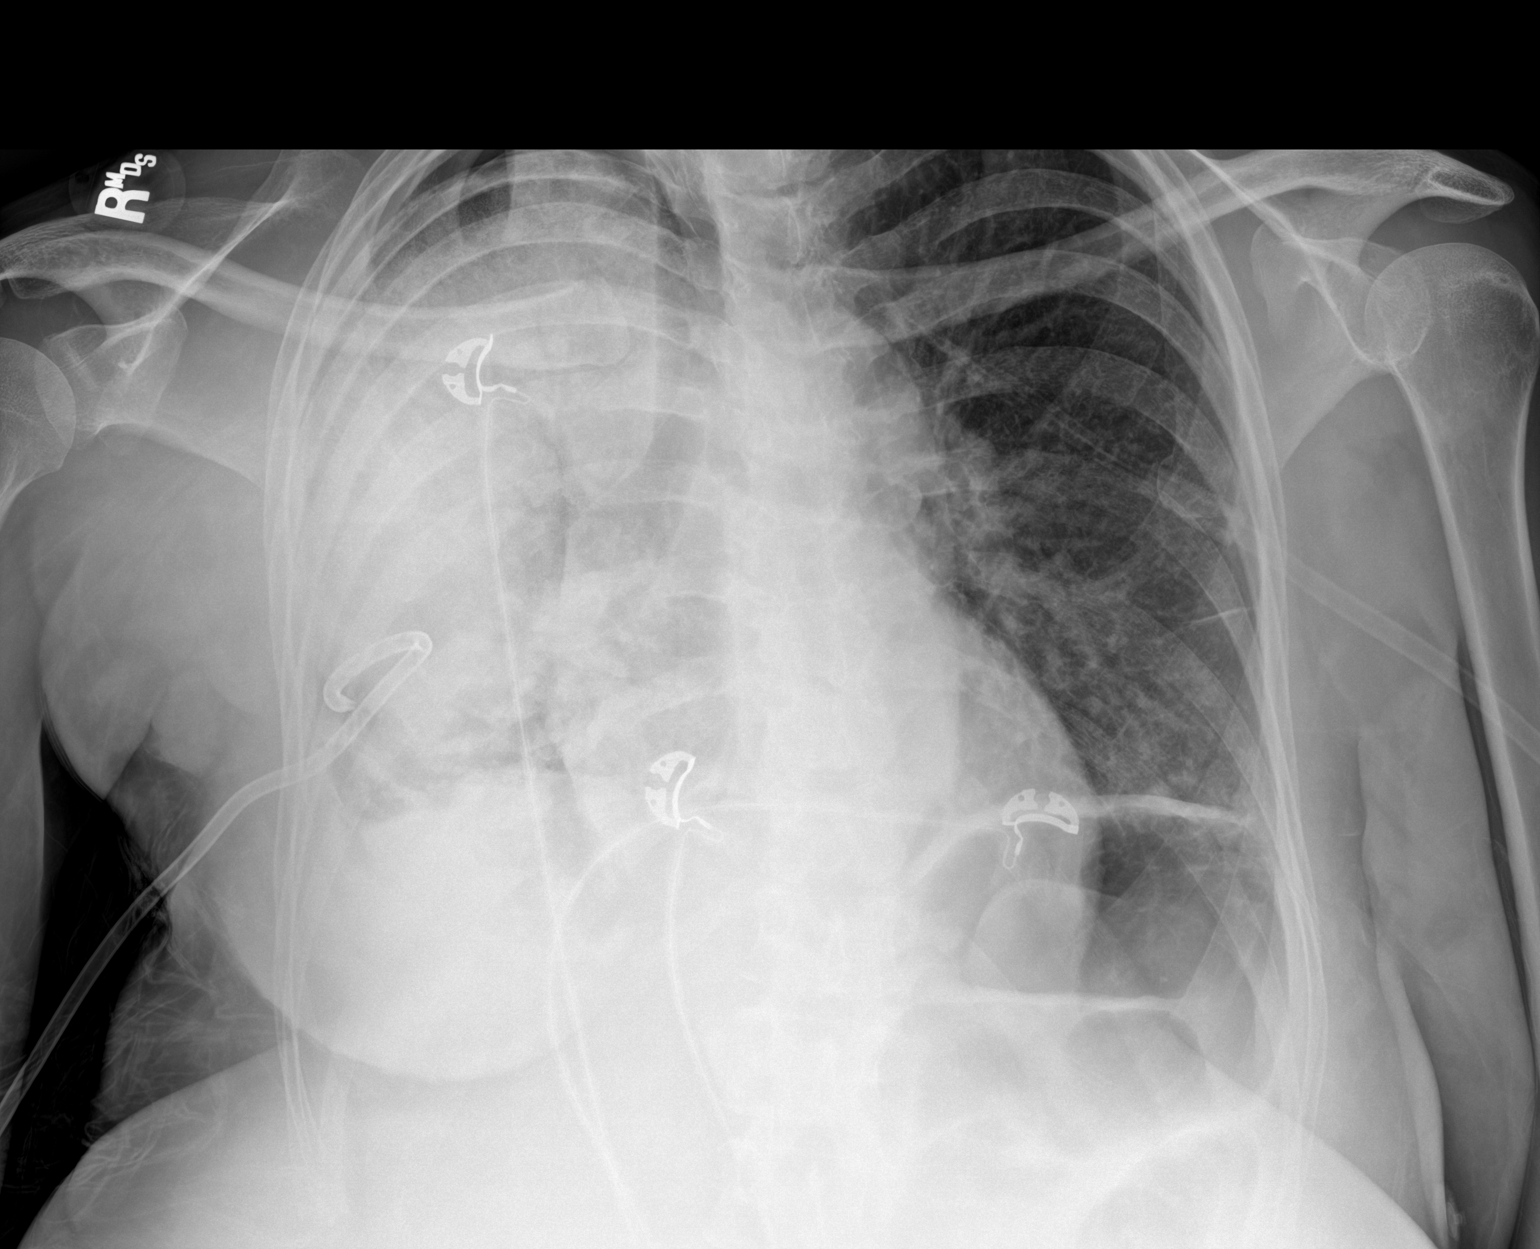

[1 of 1 positions shown; findings below may reference images not displayed]

FINDINGS: A right-sided pleural pigtail catheter is redemonstrated. There is
interval improvement in aeration of the right lung with decreased
size of a right hemothorax. A small right pneumothorax is not
significantly changed in size. Severe right atelectasis/airspace
disease is redemonstrated. Mild interstitial opacities in the left
lung appear unchanged. There is no left pleural effusion or left
pneumothorax. The cardiac silhouette is partially obscured but
appears unchanged.
IMPRESSION: 1. Interval improvement in aeration of the right lung with decreased
size of a right hemothorax.
2. Unchanged small right pneumothorax with pleural pigtail catheter
in place.
3. Severe right atelectasis/airspace disease.

## 2020-06-16 IMAGING — US US EXTREM LOW VENOUS*L*
1 series · 13 of 24 positions shown · non-contrast
Comparison: None.

CLINICAL DATA: Initial evaluation for acute left lower extremity
edema.



[Series 1: us venous img lower uni left (dvt) · portal-venous · 13 of 33 slices shown]
[im 1/33]
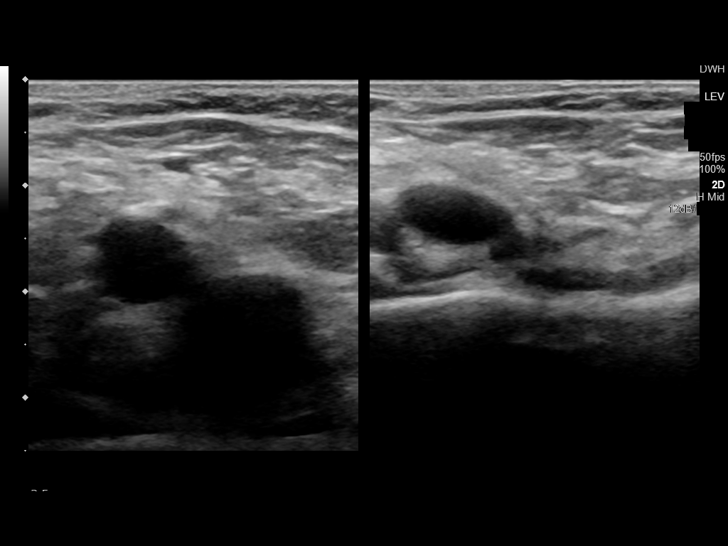
[im 3/33]
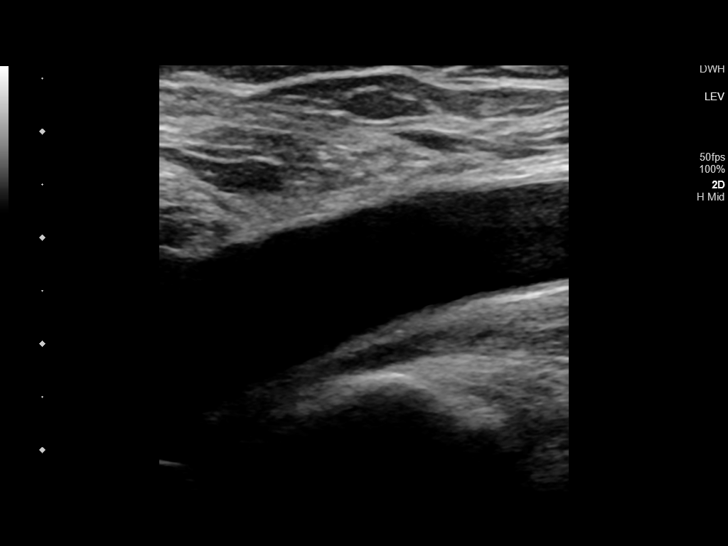
[im 6/33]
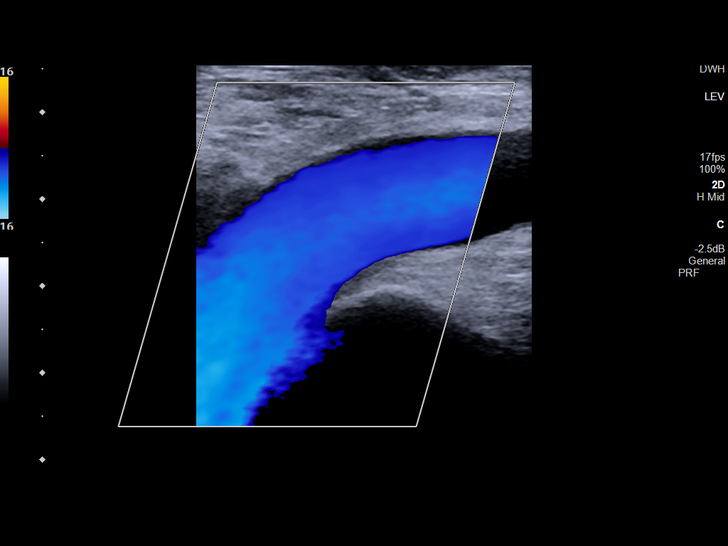
[im 9/33]
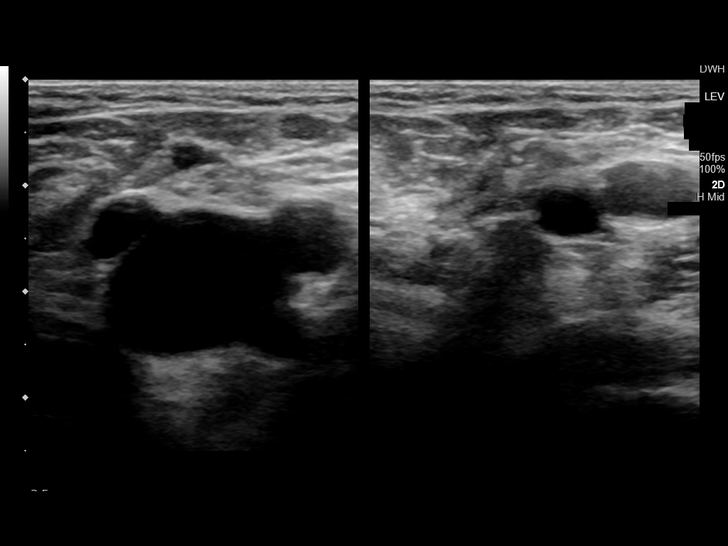
[im 12/33]
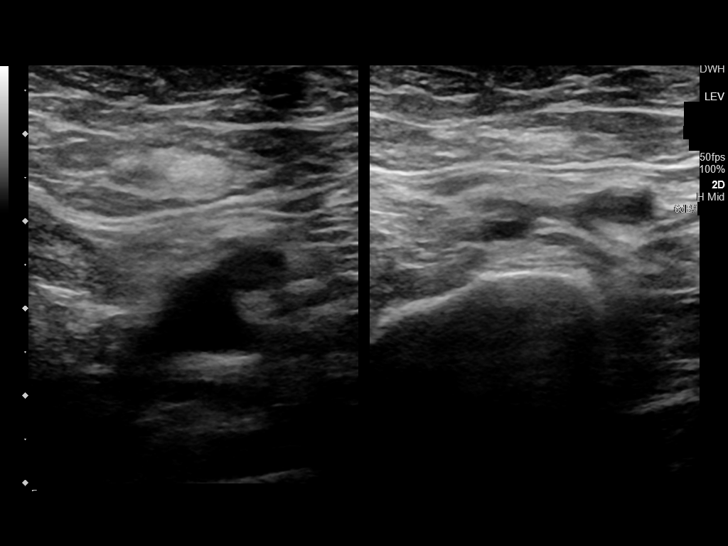
[im 14/33]
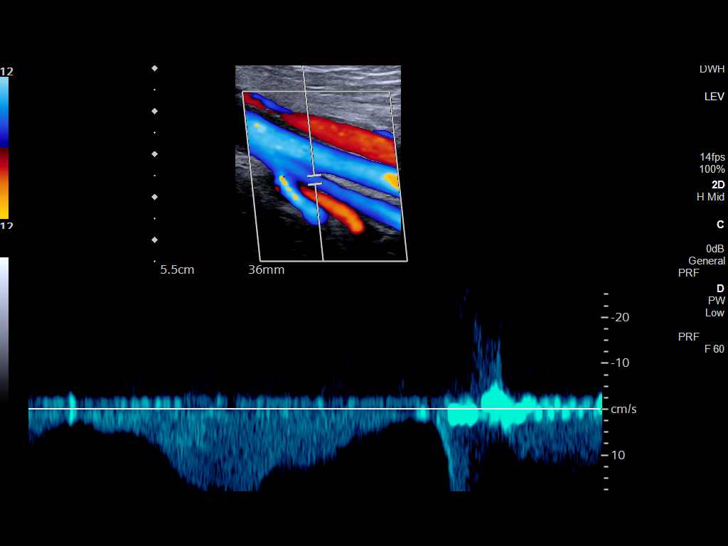
[im 17/33]
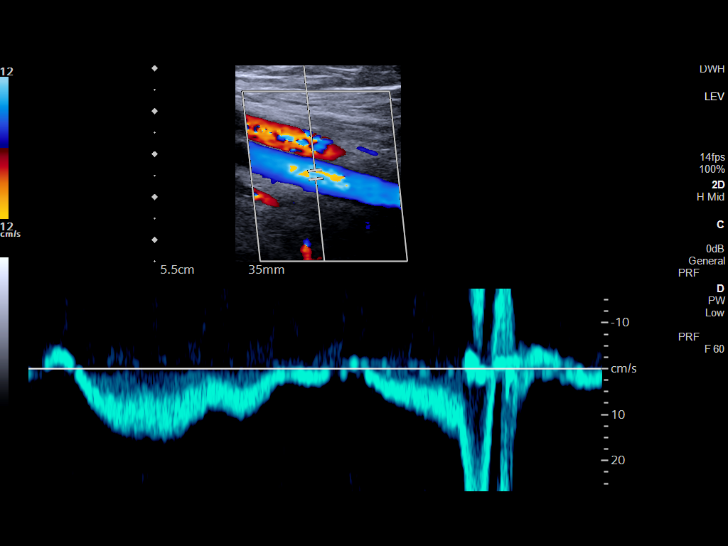
[im 19/33]
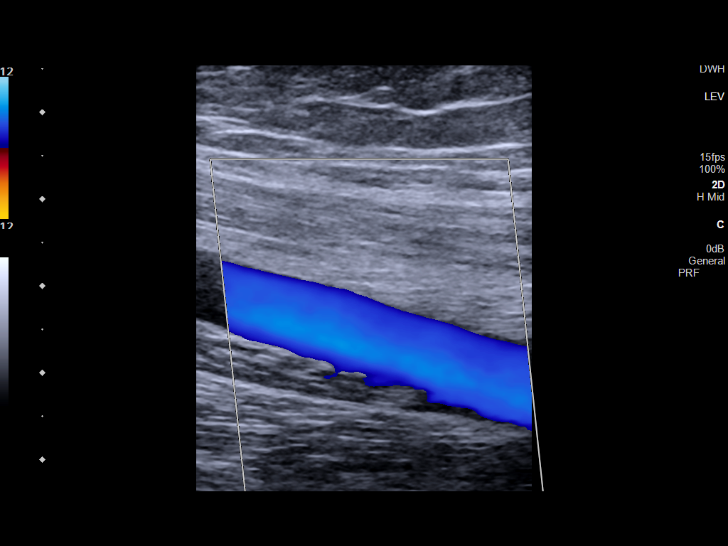
[im 21/33]
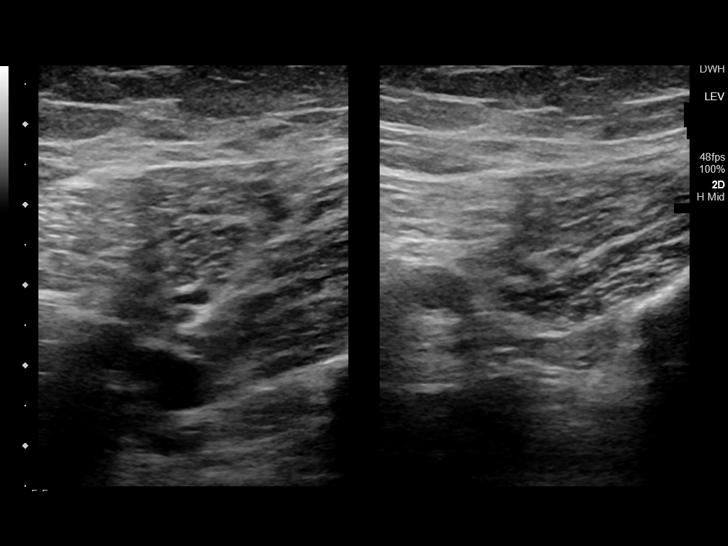
[im 24/33]
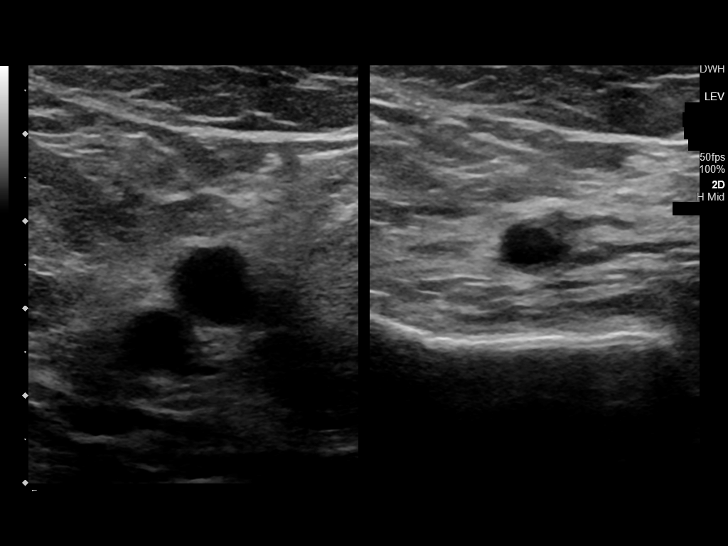
[im 27/33]
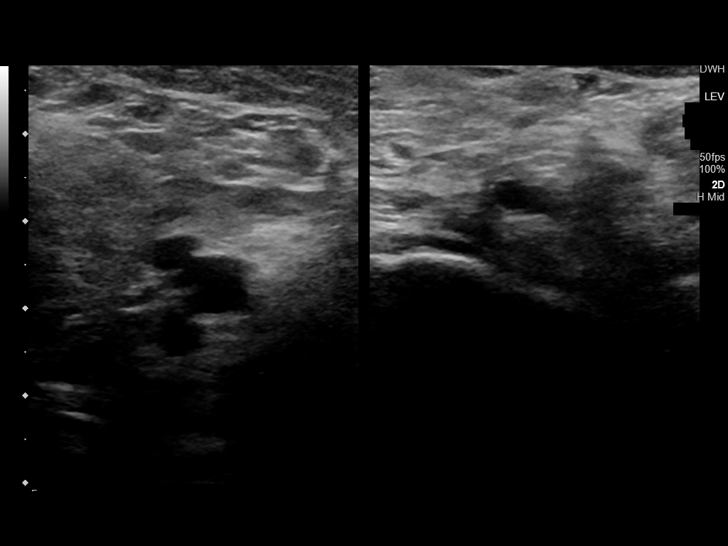
[im 30/33]
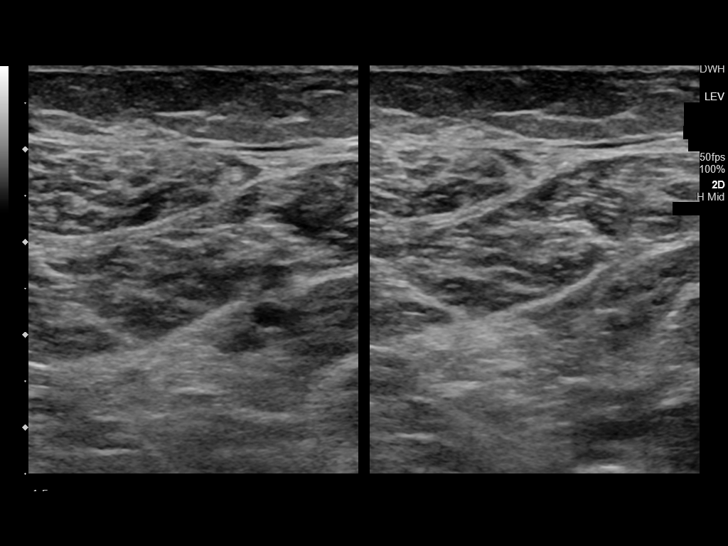
[im 33/33]
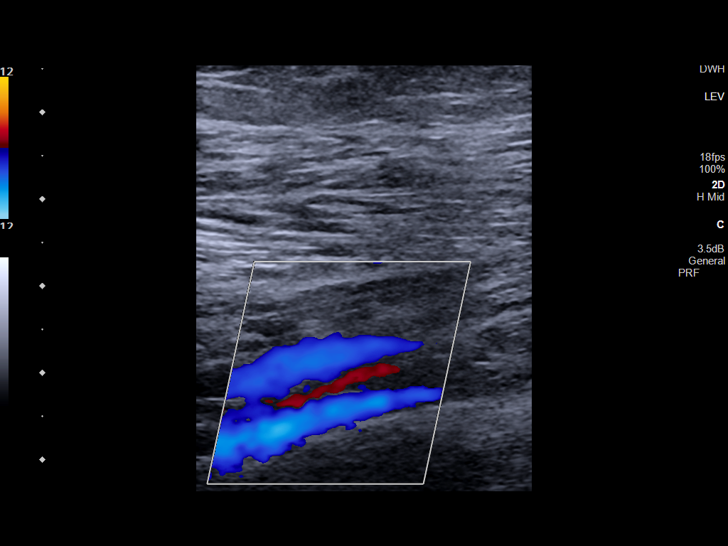

[13 of 24 positions shown; findings below may reference images not displayed]

FINDINGS: Contralateral Common Femoral Vein: Respiratory phasicity is normal
and symmetric with the symptomatic side. No evidence of thrombus.
Normal compressibility.

Common Femoral Vein: No evidence of thrombus. Normal
compressibility, respiratory phasicity and response to augmentation.

Saphenofemoral Junction: No evidence of thrombus. Normal
compressibility and flow on color Doppler imaging.

Profunda Femoral Vein: No evidence of thrombus. Normal
compressibility and flow on color Doppler imaging.

Femoral Vein: No evidence of thrombus. Normal compressibility,
respiratory phasicity and response to augmentation.

Popliteal Vein: No evidence of thrombus. Normal compressibility,
respiratory phasicity and response to augmentation.

Calf Veins: No evidence of thrombus. Normal compressibility and flow
on color Doppler imaging.

Superficial Great Saphenous Vein: No evidence of thrombus. Normal
compressibility.

Venous Reflux:  None.

Other Findings:  None.
IMPRESSION: No evidence of deep venous thrombosis.

## 2020-06-16 IMAGING — DX DG CHEST 1V
2 series · 2 of 2 positions shown · non-contrast
Comparison: Chest radiograph dated [DATE].

CLINICAL DATA: Cough and phlegm production.

EXAM:
CHEST  1 VIEW

[chest ap (1 of 2)]
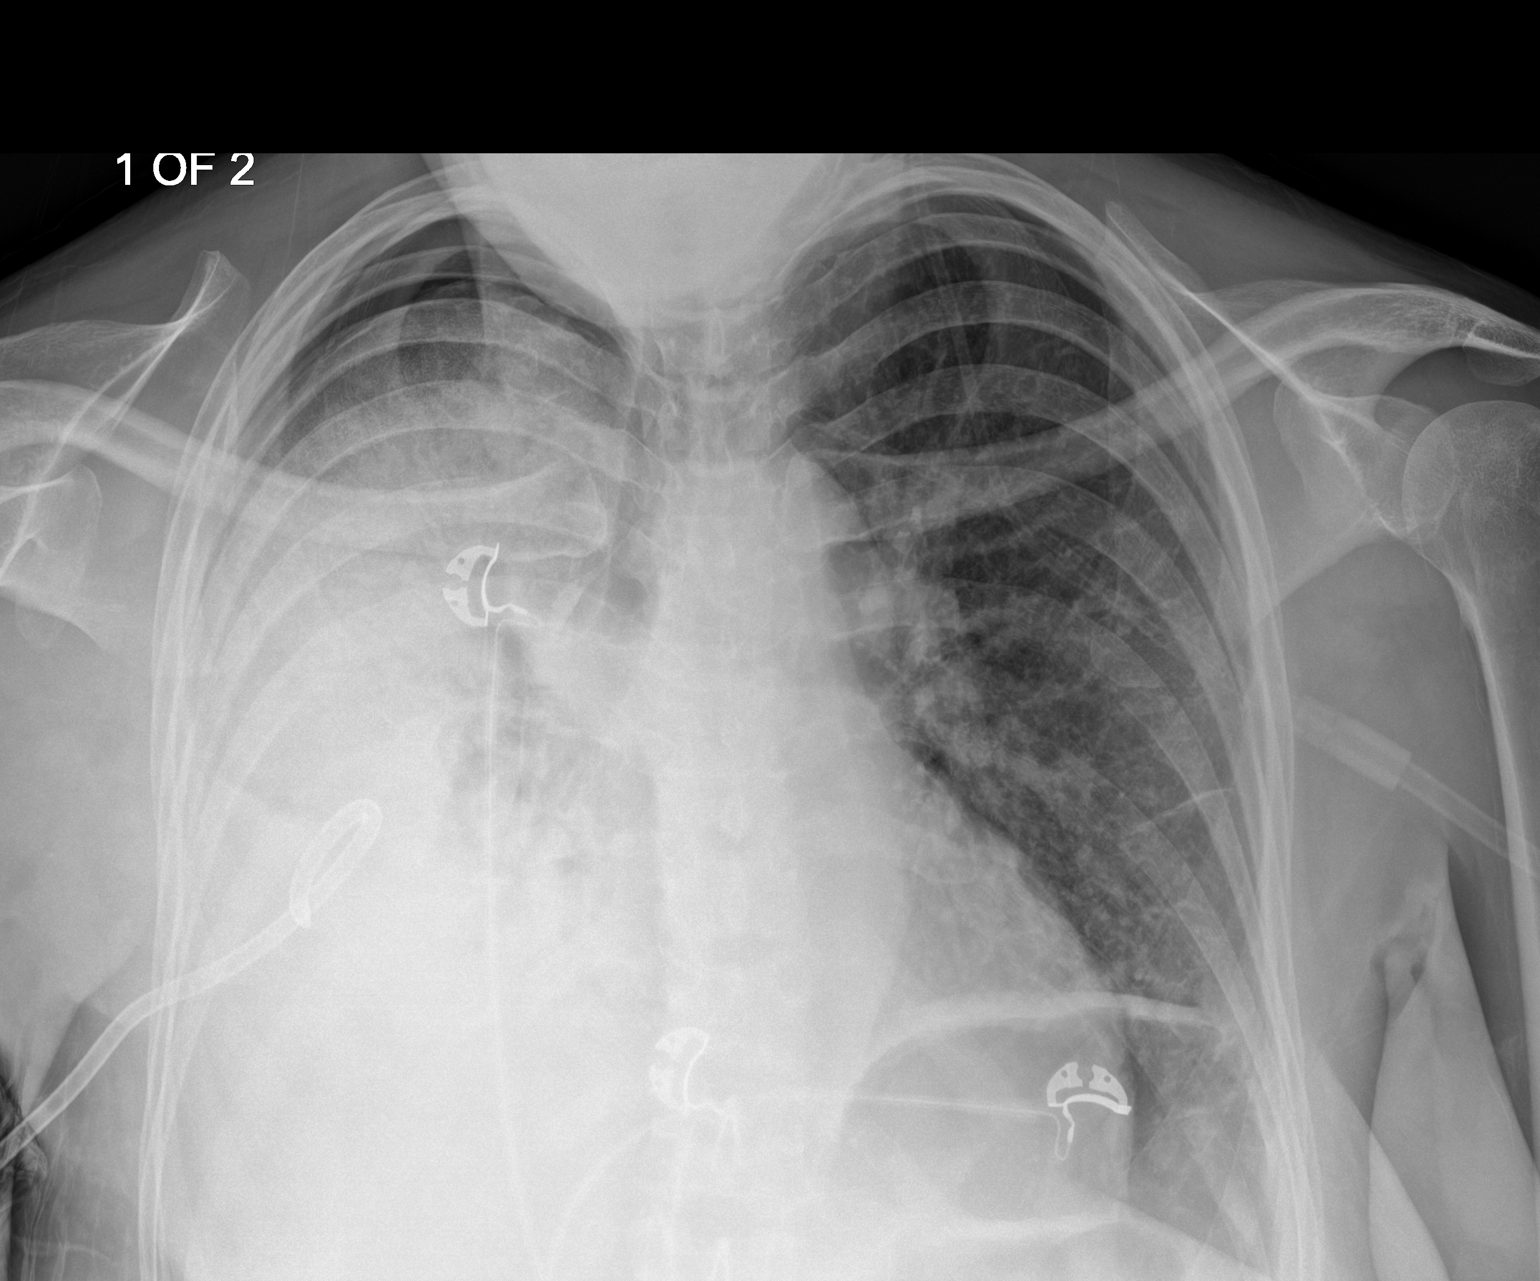

[chest ap (2 of 2)]
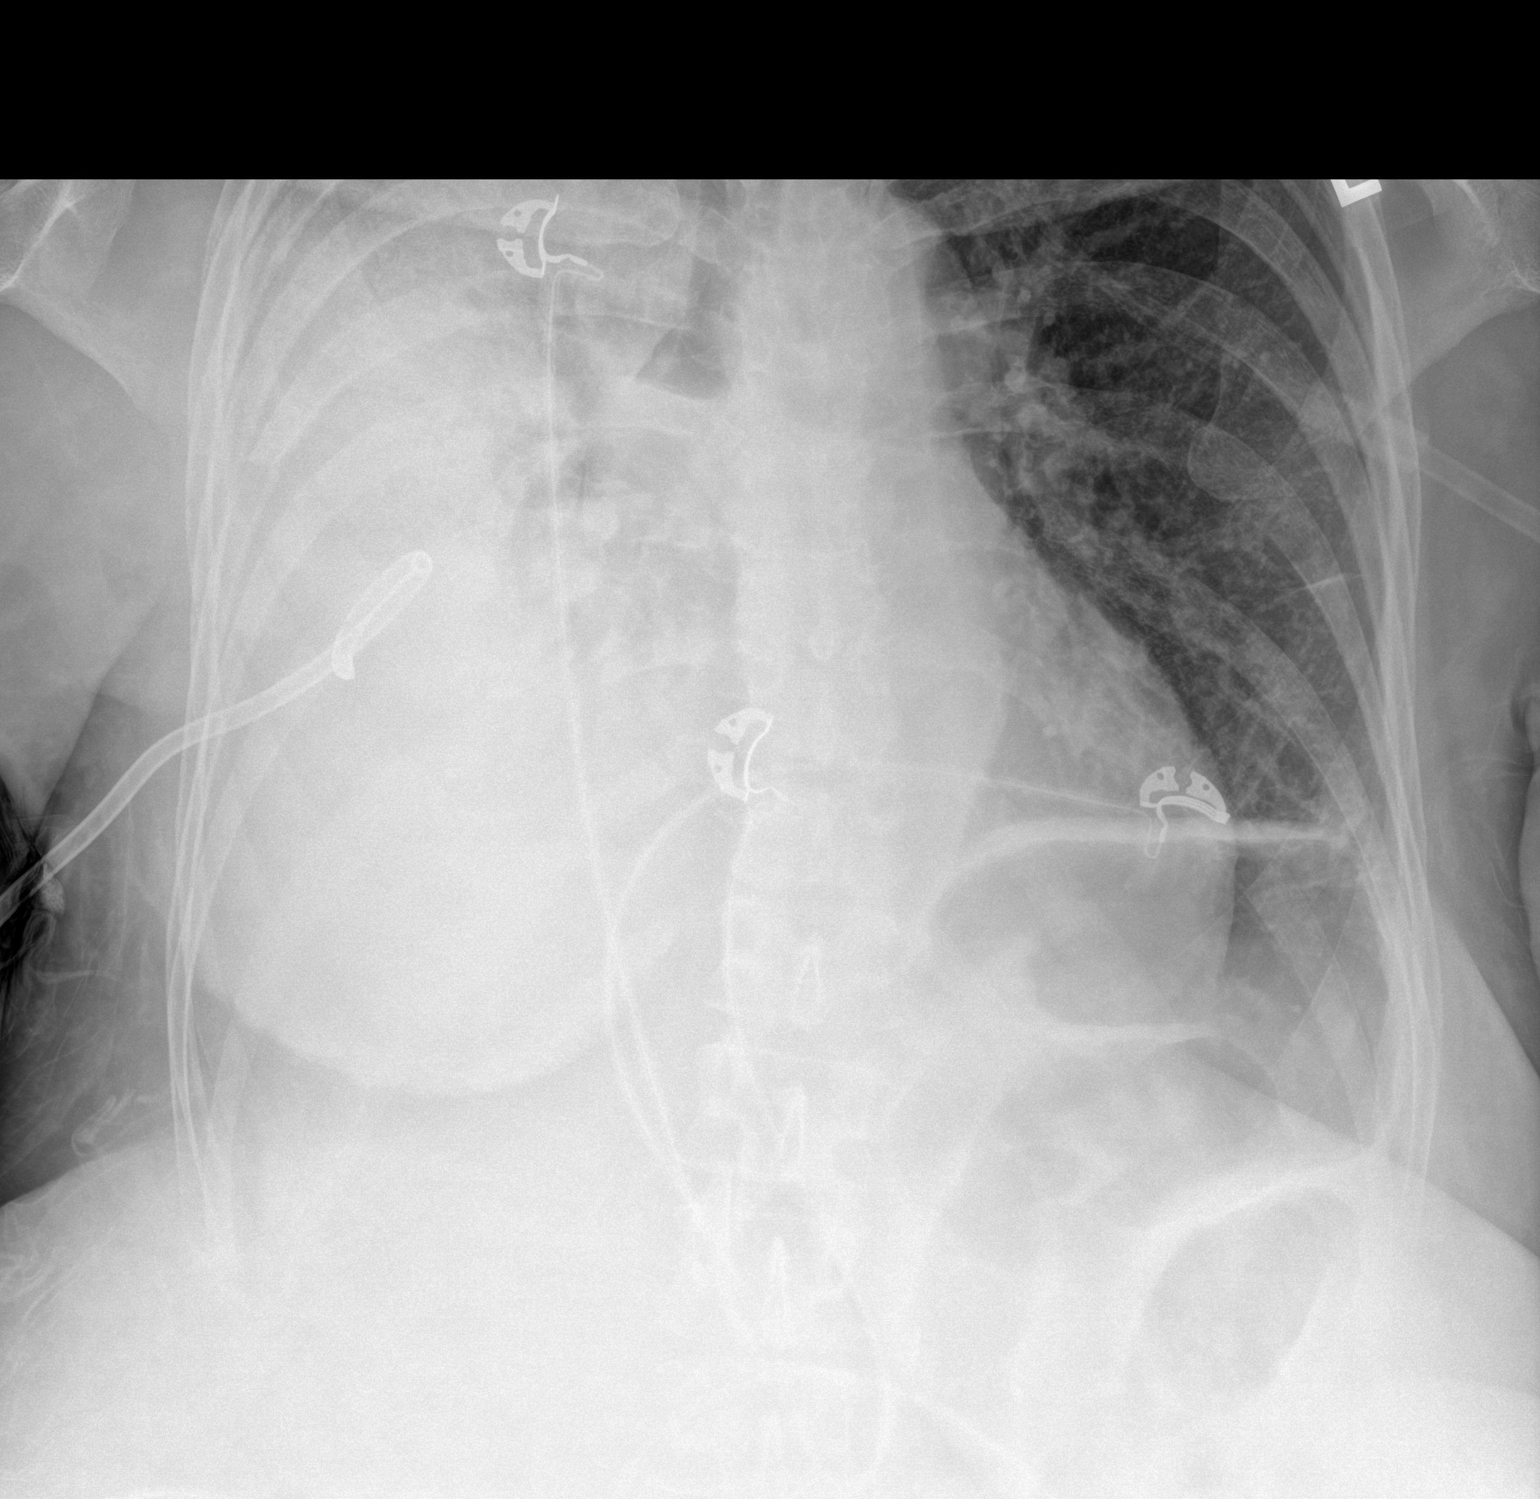

[2 of 2 positions shown; findings below may reference images not displayed]

FINDINGS: The cardiac silhouette is obscured. A right-sided pleural pigtail
catheter is in unchanged location and there has been interval
decrease in a large right pleural fluid collection (effusion versus
hemothorax). There is no longer mediastinal shift to the left. There
is a small right pneumothorax. Severe airspace and interstitial
opacities are seen on the right. Mild interstitial opacities are
seen on the left. There is no left pleural effusion or pneumothorax.
The osseous structures are intact.
IMPRESSION: 1. Small right pneumothorax with a right-sided pleural pigtail
catheter in place.
2. Interval decrease in a large right pleural fluid collection
(effusion versus hemothorax).
3. Severe airspace and interstitial opacities on the right. Mild
interstitial opacities in the left.

## 2020-06-16 MED ORDER — ONDANSETRON HCL 4 MG/2ML IJ SOLN
4.0000 mg | Freq: Four times a day (QID) | INTRAMUSCULAR | Status: DC | PRN
  Administered 2020-06-21: 4 mg via INTRAVENOUS
  Filled 2020-06-16: qty 2

## 2020-06-16 MED ORDER — GUAIFENESIN-CODEINE 100-10 MG/5ML PO SOLN
10.0000 mL | ORAL | Status: DC | PRN
  Administered 2020-06-16 – 2020-06-17 (×3): 10 mL via ORAL
  Filled 2020-06-16 (×3): qty 10

## 2020-06-16 MED ORDER — MORPHINE SULFATE (PF) 2 MG/ML IV SOLN
1.0000 mg | INTRAVENOUS | Status: DC | PRN
  Administered 2020-06-16 – 2020-06-18 (×2): 2 mg via INTRAVENOUS
  Administered 2020-06-18: 1 mg via INTRAVENOUS
  Administered 2020-06-19 – 2020-06-24 (×7): 2 mg via INTRAVENOUS
  Filled 2020-06-16 (×10): qty 1

## 2020-06-16 MED ORDER — METOPROLOL TARTRATE 5 MG/5ML IV SOLN
5.0000 mg | Freq: Four times a day (QID) | INTRAVENOUS | Status: DC | PRN
  Administered 2020-06-16 – 2020-06-21 (×8): 5 mg via INTRAVENOUS
  Filled 2020-06-16 (×8): qty 5

## 2020-06-16 MED ORDER — SODIUM CHLORIDE 0.9 % IV SOLN: 2.0000 g | INTRAVENOUS | Status: DC

## 2020-06-16 MED ORDER — ONDANSETRON HCL 4 MG PO TABS
4.0000 mg | ORAL_TABLET | Freq: Four times a day (QID) | ORAL | Status: DC | PRN
  Administered 2020-06-25: 4 mg via ORAL
  Filled 2020-06-16: qty 1

## 2020-06-16 MED ORDER — VANCOMYCIN HCL IN DEXTROSE 1-5 GM/200ML-% IV SOLN: 1000.0000 mg | Freq: Two times a day (BID) | INTRAVENOUS | Status: DC

## 2020-06-16 MED ORDER — VANCOMYCIN HCL IN DEXTROSE 1-5 GM/200ML-% IV SOLN
1000.0000 mg | Freq: Three times a day (TID) | INTRAVENOUS | Status: DC
  Administered 2020-06-16: 1000 mg via INTRAVENOUS
  Filled 2020-06-16: qty 200

## 2020-06-16 MED ORDER — VANCOMYCIN HCL 2000 MG/400ML IV SOLN
2000.0000 mg | Freq: Once | INTRAVENOUS | Status: AC
  Administered 2020-06-16: 2000 mg via INTRAVENOUS
  Filled 2020-06-16: qty 400

## 2020-06-16 MED ORDER — VANCOMYCIN HCL IN DEXTROSE 1-5 GM/200ML-% IV SOLN: 1000.0000 mg | Freq: Once | INTRAVENOUS | Status: DC

## 2020-06-16 MED ORDER — ENOXAPARIN SODIUM 40 MG/0.4ML ~~LOC~~ SOLN
40.0000 mg | SUBCUTANEOUS | Status: DC
  Administered 2020-06-16: 40 mg via SUBCUTANEOUS
  Filled 2020-06-16: qty 0.4

## 2020-06-16 MED ORDER — LACTATED RINGERS IV SOLN: INTRAVENOUS | Status: DC

## 2020-06-16 MED ORDER — POTASSIUM CHLORIDE CRYS ER 20 MEQ PO TBCR
40.0000 meq | EXTENDED_RELEASE_TABLET | Freq: Once | ORAL | Status: AC
  Administered 2020-06-16: 40 meq via ORAL
  Filled 2020-06-16: qty 2

## 2020-06-16 MED ORDER — CHLORHEXIDINE GLUCONATE CLOTH 2 % EX PADS
6.0000 | MEDICATED_PAD | Freq: Every day | CUTANEOUS | Status: DC
  Administered 2020-06-17 – 2020-06-23 (×3): 6 via TOPICAL

## 2020-06-16 MED ORDER — VANCOMYCIN HCL IN DEXTROSE 1-5 GM/200ML-% IV SOLN
1000.0000 mg | Freq: Two times a day (BID) | INTRAVENOUS | Status: AC
  Administered 2020-06-16 – 2020-06-19 (×6): 1000 mg via INTRAVENOUS
  Filled 2020-06-16 (×9): qty 200

## 2020-06-16 MED ORDER — ACETAMINOPHEN 325 MG PO TABS
650.0000 mg | ORAL_TABLET | Freq: Four times a day (QID) | ORAL | Status: DC | PRN
  Administered 2020-06-16 – 2020-06-22 (×6): 650 mg via ORAL
  Filled 2020-06-16 (×7): qty 2

## 2020-06-16 MED ORDER — MELATONIN 5 MG PO TABS
5.0000 mg | ORAL_TABLET | Freq: Every day | ORAL | Status: DC
  Administered 2020-06-16 – 2020-06-30 (×13): 5 mg via ORAL
  Filled 2020-06-16 (×13): qty 1

## 2020-06-16 MED ORDER — ACETAMINOPHEN 650 MG RE SUPP: 650.0000 mg | Freq: Four times a day (QID) | RECTAL | Status: DC | PRN

## 2020-06-16 NOTE — ED Notes (Signed)
MD notified regarding patient's reported pain 7-8/10 on numeric scale despite tylenol administration on previous shift. Requesting additional pain medication. Awaiting response from provider.

## 2020-06-16 NOTE — Consult Note (Signed)
Hematology/Oncology Consult note [facility] [phone] [phone]  Patient Care Team: System, Provider Not In as PCP - General   Name of the patient: Jessica Willis  [id number]  09/16/1965   Date of visit: 06/16/20 REASON FOR COSULTATION:  Breast mass History of presenting illness-  54 y.o. female with PMH including anemia, medication noncompliance who presents to ER for evaluation of 1 week of progressive cough, shortness of breath.  Patient has noticed swelling and skin discoloration of the right breast for at least past 6 months but did not seek any medical attention. In the emergency room, patient was found to be in acute respiratory failure with tachypnea, respiratory rate 38. Chest x-ray showed opacification of the right hemithorax consistent with large right pleural effusion and underlying right lung consolidation.  CT chest PE angiogram showed no PE, large right pleural effusion which has mass-effect with complete collapse/compression of the right lung and shift of mediastinal structures to the right.  Large right breast mass extending to the skin surface with associated adjacent skin thickening and right axillary lymphadenopathy.  Findings are compatible with right breast cancer.  Patient had right thoracentesis with catheter placement inserted in the emergency room and being admitted.  Right breast was noted to have cellulitis changes with purulent discharge leading to sepsis. Lactic acid 2.8, WBC 15.1, hemoglobin 11.5, platelet count 568, sodium 143 potassium 3.3, creatinine 1.61, AST 43, total protein 9, procalcitonin 24.  Patient was started on IV antibiotics.  Hematology oncology was consulted for further evaluation and management.  Patient was seen and evaluated.  She reports shortness of breath is slightly better.  She reports being totally overwhelmed with her CT scan results, conversation with multiple doctors, and due to  respiratory distress, she is not willing to provide any detailed history to me.  She asked me to come back tomorrow for further discussion.  She declined a physical examination. Patient son was at bedside as well.   Review of Systems  Unable to perform ROS: Severe respiratory distress  Respiratory: Positive for cough and shortness of breath.     No Known Allergies  Patient Active Problem List   Diagnosis Date Noted  . Hemothorax on right 06/16/2020  . Breast mass 06/15/2020  . Mastitis 06/15/2020  . Pleural effusion, right 06/15/2020  . Intestinal obstruction ([place])   . Leukocytosis   . Hypokalemia 06/24/2015  . Sepsis ([place]) 06/23/2015  . CAP (community acquired pneumonia) 06/23/2015  . Partial small bowel obstruction ([place]) 06/23/2015     Past Medical History:  Diagnosis Date  . Anemia   . Patient denies medical problems      Past Surgical History:  Procedure Laterality Date  . TUBAL LIGATION      Social History   Socioeconomic History  . Marital status: Single    Spouse name: Not on file  . Number of children: Not on file  . Years of education: Not on file  . Highest education level: Not on file  Occupational History  . Not on file  Tobacco Use  . Smoking status: Never Smoker  . Smokeless tobacco: Never Used  Substance and Sexual Activity  . Alcohol use: Not Currently    Alcohol/week: 1.0 standard drink    Types: 1 Cans of beer per week  . Drug use: No  . Sexual activity: Not on file  Other Topics Concern  . Not on file  Social History Narrative  . Not on file   Social  Determinants of Health   Financial Resource Strain:   . Difficulty of Paying Living Expenses: Not on file  Food Insecurity:   . Worried About Charity fundraiser in the Last Year: Not on file  . Ran Out of Food in the Last Year: Not on file  Transportation Needs:   . Lack of Transportation (Medical): Not on file  . Lack of Transportation (Non-Medical): Not on file  Physical  Activity:   . Days of Exercise per Week: Not on file  . Minutes of Exercise per Session: Not on file  Stress:   . Feeling of Stress : Not on file  Social Connections:   . Frequency of Communication with Friends and Family: Not on file  . Frequency of Social Gatherings with Friends and Family: Not on file  . Attends Religious Services: Not on file  . Active Member of Clubs or Organizations: Not on file  . Attends Archivist Meetings: Not on file  . Marital Status: Not on file  Intimate Partner Violence:   . Fear of Current or Ex-Partner: Not on file  . Emotionally Abused: Not on file  . Physically Abused: Not on file  . Sexually Abused: Not on file     Family History  Problem Relation Age of Onset  . Diabetes Other   . Hypertension Other      Current Facility-Administered Medications:  .  acetaminophen (TYLENOL) tablet 650 mg, 650 mg, Oral, Q6H PRN, 650 mg at 06/16/20 1456 **OR** acetaminophen (TYLENOL) suppository 650 mg, 650 mg, Rectal, Q6H PRN, Jonelle Sidle, Mohammad L, MD .  lactated ringers infusion, , Intravenous, Continuous, Gala Romney L, MD, Last Rate: 125 mL/hr at 06/16/20 1504, New Bag at 06/16/20 1504 .  ondansetron (ZOFRAN) tablet 4 mg, 4 mg, Oral, Q6H PRN **OR** ondansetron (ZOFRAN) injection 4 mg, 4 mg, Intravenous, Q6H PRN, Jonelle Sidle, Mohammad L, MD .  vancomycin (VANCOCIN) IVPB 1000 mg/200 mL premix, 1,000 mg, Intravenous, Q12H, Zeigler, Dustin G, RPH No current outpatient medications on file.   Physical exam:  Vitals:   06/16/20 1410 06/16/20 1415 06/16/20 1430 06/16/20 1445  BP: 120/71 (!) 139/119 (!) 147/91 (!) 139/97  Pulse: (!) 126 (!) 124 (!) 129 (!) 54  Resp: (!) 34 (!) 27 17 (!) 25  Temp:      TempSrc:      SpO2: 93% 91%  91%  Weight:      Height:       Physical Exam   + Chest tube Patient declined physical examination   CMP Latest Ref Rng & Units 06/16/2020  Glucose 70 - 99 mg/dL 124(H)  BUN 6 - 20 mg/dL 20  Creatinine 0.44 - 1.00  mg/dL 0.56  Sodium 135 - 145 mmol/L 140  Potassium 3.5 - 5.1 mmol/L 3.3(L)  Chloride 98 - 111 mmol/L 100  CO2 22 - 32 mmol/L 27  Calcium 8.9 - 10.3 mg/dL 9.1  Total Protein 6.5 - 8.1 g/dL 8.8(H)  Total Bilirubin 0.3 - 1.2 mg/dL 0.8  Alkaline Phos 38 - 126 U/L 102  AST 15 - 41 U/L 38  ALT 0 - 44 U/L 27   CBC Latest Ref Rng & Units 06/16/2020  WBC 4.0 - 10.5 K/uL 13.0(H)  Hemoglobin 12.0 - 15.0 g/dL 10.3(L)  Hematocrit 36 - 46 % 33.5(L)  Platelets 150 - 400 K/uL 465(H)    RADIOGRAPHIC STUDIES: I have personally reviewed the radiological images as listed and agreed with the findings in the report. DG Chest 1  View  Result Date: 06/16/2020 CLINICAL DATA:  Hemothorax on the right. EXAM: CHEST  1 VIEW COMPARISON:  Same day chest radiograph. FINDINGS: A right-sided pleural pigtail catheter is redemonstrated. There is interval improvement in aeration of the right lung with decreased size of a right hemothorax. A small right pneumothorax is not significantly changed in size. Severe right atelectasis/airspace disease is redemonstrated. Mild interstitial opacities in the left lung appear unchanged. There is no left pleural effusion or left pneumothorax. The cardiac silhouette is partially obscured but appears unchanged. IMPRESSION: 1. Interval improvement in aeration of the right lung with decreased size of a right hemothorax. 2. Unchanged small right pneumothorax with pleural pigtail catheter in place. 3. Severe right atelectasis/airspace disease. Electronically Signed   By: Zerita Boers M.D.   On: 06/16/2020 14:52   DG Chest 1 View  Result Date: 06/16/2020 CLINICAL DATA:  Cough and phlegm production. EXAM: CHEST  1 VIEW COMPARISON:  Chest radiograph dated 06/15/2020. FINDINGS: The cardiac silhouette is obscured. A right-sided pleural pigtail catheter is in unchanged location and there has been interval decrease in a large right pleural fluid collection (effusion versus hemothorax). There is no  longer mediastinal shift to the left. There is a small right pneumothorax. Severe airspace and interstitial opacities are seen on the right. Mild interstitial opacities are seen on the left. There is no left pleural effusion or pneumothorax. The osseous structures are intact. IMPRESSION: 1. Small right pneumothorax with a right-sided pleural pigtail catheter in place. 2. Interval decrease in a large right pleural fluid collection (effusion versus hemothorax). 3. Severe airspace and interstitial opacities on the right. Mild interstitial opacities in the left. Electronically Signed   By: Zerita Boers M.D.   On: 06/16/2020 14:28   DG Chest 1 View  Result Date: 06/15/2020 CLINICAL DATA:  Short of breath, cough EXAM: CHEST  1 VIEW COMPARISON:  06/29/2015 FINDINGS: 2 frontal views of the chest demonstrate complete opacification of the right hemithorax, with slight mediastinal shift to the left, consistent with large right pleural effusion and underlying lung consolidation. Chronic areas of scarring are seen within the left chest. No pneumothorax. Cardiac silhouette is unremarkable. No acute bony abnormalities. IMPRESSION: 1. Opacification of the right hemithorax consistent with large right pleural effusion and underlying right lung consolidation. Electronically Signed   By: Randa Ngo M.D.   On: 06/15/2020 21:42   CT Angio Chest PE W and/or Wo Contrast  Result Date: 06/15/2020 CLINICAL DATA:  Cough, shortness of breath EXAM: CT ANGIOGRAPHY CHEST WITH CONTRAST TECHNIQUE: Multidetector CT imaging of the chest was performed using the standard protocol during bolus administration of intravenous contrast. Multiplanar CT image reconstructions and MIPs were obtained to evaluate the vascular anatomy. CONTRAST:  100 mL Omnipaque 350 IV COMPARISON:  06/30/2015 FINDINGS: Cardiovascular: No filling defects in the pulmonary arteries to suggest pulmonary emboli. Heart is normal size. Aorta normal caliber.  Mediastinum/Nodes: No visible mediastinal or hilar adenopathy. Bilateral axillary adenopathy, right greater than left. Index right axillary lymph node has a short axis diameter of 13 mm. Left axillary lymph node has a short axis diameter of 11 mm. Lungs/Pleura: Large right pleural effusion completely compressed seen in the right lung which is not aerated. This appears to have mass effect with shift of the right lung and mediastinal structures to the left. Areas of atelectasis or scarring in the left lung base. No effusion on the left. Upper Abdomen: Imaging into the upper abdomen demonstrates no acute findings. Musculoskeletal: Large mass noted  in the right breast measuring 8 x 6 cm. Possible separate right breast mass more inferiorly in the right breast measuring 8 x 4 cm. Skin thickening or edema noted. Associated right axillary adenopathy. A few ill-defined sclerotic areas within the thoracic spine could reflect metastases. Review of the MIP images confirms the above findings. IMPRESSION: No evidence of pulmonary embolus. One, possibly 2 separate large masses within the right breast extending to the skin surface with associated adjacent skin thickening and right axillary adenopathy. Findings compatible with right breast cancer. Large right pleural effusion which appears to have mass effect with complete collapse/compression of the right lung and shift of mediastinal structures to the left. Single enlarged left axillary lymph node. These results were called by telephone at the time of interpretation on 06/15/2020 at 10:43 pm to provider [facility] , who verbally acknowledged these results. Electronically Signed   By: Rolm Baptise M.D.   On: 06/15/2020 22:46   US Venous Img Lower Unilateral Left (DVT)  Result Date: 06/16/2020 CLINICAL DATA:  Initial evaluation for acute left lower extremity edema. EXAM: LEFT LOWER EXTREMITY VENOUS DOPPLER ULTRASOUND TECHNIQUE: Gray-scale sonography with graded compression, as  well as color Doppler and duplex ultrasound were performed to evaluate the lower extremity deep venous systems from the level of the common femoral vein and including the common femoral, femoral, profunda femoral, popliteal and calf veins including the posterior tibial, peroneal and gastrocnemius veins when visible. The superficial great saphenous vein was also interrogated. Spectral Doppler was utilized to evaluate flow at rest and with distal augmentation maneuvers in the common femoral, femoral and popliteal veins. COMPARISON:  None. FINDINGS: Contralateral Common Femoral Vein: Respiratory phasicity is normal and symmetric with the symptomatic side. No evidence of thrombus. Normal compressibility. Common Femoral Vein: No evidence of thrombus. Normal compressibility, respiratory phasicity and response to augmentation. Saphenofemoral Junction: No evidence of thrombus. Normal compressibility and flow on color Doppler imaging. Profunda Femoral Vein: No evidence of thrombus. Normal compressibility and flow on color Doppler imaging. Femoral Vein: No evidence of thrombus. Normal compressibility, respiratory phasicity and response to augmentation. Popliteal Vein: No evidence of thrombus. Normal compressibility, respiratory phasicity and response to augmentation. Calf Veins: No evidence of thrombus. Normal compressibility and flow on color Doppler imaging. Superficial Great Saphenous Vein: No evidence of thrombus. Normal compressibility. Venous Reflux:  None. Other Findings:  None. IMPRESSION: No evidence of deep venous thrombosis. Electronically Signed   By: Jeannine Boga M.D.   On: 06/16/2020 01:30   DG Chest Portable 1 View  Result Date: 06/15/2020 CLINICAL DATA:  Right chest tube placement. EXAM: PORTABLE CHEST 1 VIEW COMPARISON:  Radiograph and CT earlier today. FINDINGS: Placement of right pigtail catheter. There is persistent complete opacification of the right hemithorax. There may be slight diminished  mediastinal shift to the left. No visualized pneumothorax. No focal abnormality in the left lung. IMPRESSION: Placement of right pigtail catheter with persistent complete opacification of the right hemithorax. There may be slight diminished mediastinal shift to the left. No visualized pneumothorax. Electronically Signed   By: Keith Rake M.D.   On: 06/15/2020 23:40    Assessment and plan-   #Sepsis secondary to right breast cellulitis, Continue IV vancomycin.  #Acute hypoxic respiratory failure, secondary to right pleural effusion Status post thoracentesis.  Continue nasal cannula oxygen. Likely malignant.  Pleural fluid culture, studies, cytology are pending.  #Large right breast mass with skin involvement. Awaiting cytology.  She may need breast biopsy if cytology is not conclusive  or if there is need for additional tissue specimen for testing.  Highly suspicious for right breast cancer with malignant pleural effusion further treatment pending above work-up.   Palliative care medicine has been consulted for goals of care discussion.  DVT prophylaxis with Lovenox.   Thank you for allowing me to participate in the care of this patient.   Earlie Server, MD, PhD Hematology Oncology [facility] Pager- [phone] 06/16/2020

## 2020-06-16 NOTE — ED Notes (Signed)
Lab in room to collect blood

## 2020-06-16 NOTE — Progress Notes (Signed)
Patient ID: Jessica Willis, female   DOB: May 17, 1966, 54 y.o.   MRN: [id number]  Chief Complaint  Patient presents with  . Cough  . Shortness of Breath    Referred By Dr. Terri Piedra Reason for Referral Tension right pleural effusion  HPI Location, Quality, Duration, Severity, Timing, Context, Modifying Factors, Associated Signs and Symptoms.  Jessica Willis is a 54 y.o. female.  She presented yesterday with shortness of breath and cough and found to have a large mass of the right breast with axillary adenopathy on exam.  CXRay showed a tension right pleural effusion and she had a CT scan and insertion of pigtail catheter.  The catheter was not placed to drainage until Dr. Terri Piedra saw the patient and he then placed it to suction and since then, it has drained about 3.5 liters.  We performed several chest xrays which demonstrated that the pleural effusion was improving and the output slowed down considerably implying that there was not an ongoing blood loss.  We also send a H/H from the patient and the fluid but that is still pending.  The fluid, though serosanguinous does not appear like fresh blood.  The patient states that she has been short of breath for over 2 weeks.  She is being admitted to work up her breast mass.   Past Medical History:  Diagnosis Date  . Anemia   . Patient denies medical problems     Past Surgical History:  Procedure Laterality Date  . TUBAL LIGATION      Family History  Problem Relation Age of Onset  . Diabetes Other   . Hypertension Other     Social History Social History   Tobacco Use  . Smoking status: Never Smoker  . Smokeless tobacco: Never Used  Substance Use Topics  . Alcohol use: Not Currently    Alcohol/week: 1.0 standard drink    Types: 1 Cans of beer per week  . Drug use: No    No Known Allergies  Current Facility-Administered Medications  Medication Dose Route Frequency Provider Last Rate Last Admin  . acetaminophen (TYLENOL)  tablet 650 mg  650 mg Oral Q6H PRN Elwyn Reach, MD   650 mg at 06/16/20 1456   Or  . acetaminophen (TYLENOL) suppository 650 mg  650 mg Rectal Q6H PRN Elwyn Reach, MD      . lactated ringers infusion   Intravenous Continuous Elwyn Reach, MD 125 mL/hr at 06/16/20 1504 New Bag at 06/16/20 1504  . ondansetron (ZOFRAN) tablet 4 mg  4 mg Oral Q6H PRN Elwyn Reach, MD       Or  . ondansetron (ZOFRAN) injection 4 mg  4 mg Intravenous Q6H PRN Elwyn Reach, MD      . vancomycin (VANCOCIN) IVPB 1000 mg/200 mL premix  1,000 mg Intravenous Q12H Zeigler, Sandi Mealy, RPH       No current outpatient medications on file.      Review of Systems A complete review of systems was asked and was negative except for the following positive findings shortness of breath  Blood pressure (!) 139/97, pulse (!) 54, temperature 98.4 F (36.9 C), temperature source Oral, resp. rate (!) 25, height 5\' 10"  (1.778 m), weight 86.2 kg, last menstrual period 06/16/2015, SpO2 91 %.  Physical Exam She is awake and alert.  Coughing regularly which I attribute to lung reexpansion The chest tube dressing is clean and dry There is a rare small air bubble seen in the PleurEvac.  Multiple blood pressures during my stay were all stable at the 140/100 range  Data Reviewed Multiple chest xray and CT scan  I have personally reviewed the patient's imaging, laboratory findings and medical records.    Assessment    Probable malignant pleural effusion    Plan    Admit to hospital Work up of breast mass in progress Would leave chest drain to 20 cm of water suction until further notice If the patient is a candidate, we could consider PleurX catheter insertion next week  Dr. Dahlia Byes will be covering during my absence.       Nestor Lewandowsky, MD 06/16/2020, 4:41 PM   Patient ID: Jessica Willis, female   DOB: 06-26-1966, 54 y.o.   MRN: [id number]

## 2020-06-16 NOTE — Plan of Care (Signed)
  Problem: Clinical Measurements: Goal: Ability to avoid or minimize complications of infection will improve Outcome: Progressing   Problem: Skin Integrity: Goal: Skin integrity will improve Outcome: Progressing   Problem: Education: Goal: Knowledge of General Education information will improve Description: Including pain rating scale, medication(s)/side effects and non-pharmacologic comfort measures Outcome: Progressing   Problem: Health Behavior/Discharge Planning: Goal: Ability to manage health-related needs will improve Outcome: Progressing   Problem: Clinical Measurements: Goal: Ability to maintain clinical measurements within normal limits will improve Outcome: Progressing Goal: Will remain free from infection Outcome: Progressing Goal: Diagnostic test results will improve Outcome: Progressing Goal: Respiratory complications will improve Outcome: Progressing Goal: Cardiovascular complication will be avoided Outcome: Progressing   Problem: Activity: Goal: Risk for activity intolerance will decrease Outcome: Progressing   Problem: Nutrition: Goal: Adequate nutrition will be maintained Outcome: Progressing   Problem: Coping: Goal: Level of anxiety will decrease Outcome: Progressing   Problem: Elimination: Goal: Will not experience complications related to bowel motility Outcome: Progressing Goal: Will not experience complications related to urinary retention Outcome: Progressing   Problem: Pain Managment: Goal: General experience of comfort will improve Outcome: Progressing   Problem: Safety: Goal: Ability to remain free from injury will improve Outcome: Progressing   Problem: Fluid Volume: Goal: Hemodynamic stability will improve Outcome: Progressing   Problem: Clinical Measurements: Goal: Diagnostic test results will improve Outcome: Progressing Goal: Signs and symptoms of infection will decrease Outcome: Progressing   Problem: Respiratory: Goal:  Ability to maintain adequate ventilation will improve Outcome: Progressing

## 2020-06-16 NOTE — ED Notes (Signed)
Assisted patient with transferring to hospital bed in effort to promote comfort while awaiting floor room. Pt comfortably in bed with HOB raised ~90 degrees. Bed is low and locked with fall alarm initiated and side rails raised x3. Cardiac monitor remains in place. Chest tube is secured and continues to drain. Pt provided with extra pillow and 2 warm blankets per request and denies further needs at this time. Call bell is in reach and pt verbalizes she will call with needs/requests. RN to continue to monitor.

## 2020-06-16 NOTE — ED Notes (Signed)
Pt placed on purwick at this time. No further needs noted.

## 2020-06-16 NOTE — Progress Notes (Signed)
Pharmacy Antibiotic Note  Jessica Willis is a 54 y.o. female admitted on 06/15/2020 with sepsis secondary to cellulitis.  Pharmacy has been consulted for Vancomycin dosing.  Plan: Vancomycin 2 gm IV X 1 ordered to be given on 11/4 @ ~ 0200. Vancomycin 1 gm IV Q8H ordered to start on 11/4 @ ~ 1000.   Height: 5\' 10"  (177.8 cm) Weight: 86.2 kg (190 lb) IBW/kg (Calculated) : 68.5  Temp (24hrs), Avg:98.2 F (36.8 C), Min:98.2 F (36.8 C), Max:98.2 F (36.8 C)  Recent Labs  Lab 06/15/20 2125  WBC 15.1*  CREATININE 0.61  LATICACIDVEN 2.8*    Estimated Creatinine Clearance: 94.8 mL/min (by C-G formula based on SCr of 0.61 mg/dL).    No Known Allergies  Antimicrobials this admission:   >>    >>   Dose adjustments this admission:   Microbiology results:  BCx:   UCx:    Sputum:    MRSA PCR:   Thank you for allowing pharmacy to be a part of this patient's care.  Zebulon Gantt D 06/16/2020 1:03 AM

## 2020-06-16 NOTE — ED Notes (Signed)
Phlebotomy attempt x2 with no success. Lab notified.

## 2020-06-16 NOTE — Progress Notes (Signed)
Arrived at patients room the patient has 2 PIV sites that are in working condition. The PIV in her left FA was assessed and redressed. The IV in the left [facility] is infusing at this time.

## 2020-06-16 NOTE — Consult Note (Addendum)
Subjective:  LATE ENTRY  CC: breast mass and pleural effusion  HPI:  Jessica Willis is a 54 y.o. female who was consulted by Maylene Roes for evaluation of above. Noted a red lump on her right breast supposedly six months ago.  Presented to the emergency department for shortness of breath and noted to have a fungating right breast mass along with a right pleural effusion.  She had a thoracostomy tube catheter placed by ED physician, which noted serosanguineous fluid.  This was sent for fluid analysis.  Surgery consulted for further management of this fungating mass.  At beginning of exam stated she is in no pain and is doing well.   Past Medical History:  has a past medical history of Anemia  Past Surgical History:  has a past surgical history that includes Tubal ligation.  Family History: family history includes Diabetes in an other family member; Hypertension in an other family member. No cancer  Social History:  reports that she has never smoked. She has never used smokeless tobacco. She reports previous alcohol use of about 1.0 standard drink of alcohol per week. She reports that she does not use drugs.  Current Medications:  Prior to Admission medications   Not on File    Allergies:  No Known Allergies  ROS:  General: Denies weight loss, weight gain, fatigue, fevers, chills, and night sweats. Eyes: Denies blurry vision, double vision, eye pain, itchy eyes, and tearing. Ears: Denies hearing loss, earache, and ringing in ears. Nose: Denies sinus pain, congestion, infections, runny nose, and nosebleeds. Mouth/throat: Denies hoarseness, sore throat, bleeding gums, and difficulty swallowing. Heart: Denies chest pain, palpitations, racing heart, irregular heartbeat, leg pain or swelling, and decreased activity tolerance. Respiratory: Denies breathing difficulty, shortness of breath, wheezing, cough, and sputum. GI: Denies change in appetite, heartburn, nausea, vomiting, constipation,  diarrhea, and blood in stool. GU: Denies difficulty urinating, pain with urinating, urgency, frequency, blood in urine. Musculoskeletal: Denies joint stiffness, pain, swelling, muscle weakness. Skin: Denies rash, itching, mass, tumors, sores, and boils Neurologic: Denies headache, fainting, dizziness, seizures, numbness, and tingling. Psychiatric: Denies depression, anxiety, difficulty sleeping, and memory loss. Endocrine: Denies heat or cold intolerance, and increased thirst or urination. Blood/lymph: Denies easy bruising, easy bruising, and swollen glands     Objective:     BP 139/88 (BP Location: Left Arm)   Pulse (!) 137   Temp 99.5 F (37.5 C) (Axillary)   Resp (!) 23   Ht 5\' 10"  (1.778 m)   Wt 86.2 kg   LMP 06/16/2015 Comment: pt has had tubes tied   SpO2 96%   BMI 27.26 kg/m   Constitutional :  alert, cooperative, appears stated age and no distress  Lymphatics/Throat:  no asymmetry, masses, or scars  Respiratory:  clear to auscultation bilaterally chest catheter capped  Cardiovascular:  regular rate and rhythm  Gastrointestinal: soft, non-tender; bowel sounds normal; no masses,  no organomegaly.  Musculoskeletal: Steady gait and movement  Skin: Cool and moist.  See picture below of right breast mass, examined with chaperone.  Hard, firm skin, with punctuate exudate but no overwhelming purulent discharge, erythema, TTP.  Psychiatric: Normal affect, non-agitated, not confused           LABS:  CMP Latest Ref Rng & Units 06/16/2020 06/15/2020 06/30/2015  Glucose 70 - 99 mg/dL 124(H) 146(H) 99  BUN 6 - 20 mg/dL 20 22(H) <5(L)  Creatinine 0.44 - 1.00 mg/dL 0.56 0.61 0.46  Sodium 135 - 145 mmol/L 140 143 134(L)  Potassium 3.5 - 5.1 mmol/L 3.3(L) 3.3(L) 4.2  Chloride 98 - 111 mmol/L 100 101 104  CO2 22 - 32 mmol/L 27 25 24   Calcium 8.9 - 10.3 mg/dL 9.1 9.5 8.2(L)  Total Protein 6.5 - 8.1 g/dL 8.8(H) 9.0(H) -  Total Bilirubin 0.3 - 1.2 mg/dL 0.8 1.0 -  Alkaline Phos 38  - 126 U/L 102 107 -  AST 15 - 41 U/L 38 43(H) -  ALT 0 - 44 U/L 27 29 -   CBC Latest Ref Rng & Units 06/16/2020 06/16/2020 06/16/2020  WBC 4.0 - 10.5 K/uL - - 13.0(H)  Hemoglobin 12.0 - 15.0 g/dL 14.6 14.0 10.3(L)  Hematocrit 36 - 46 % 46.0 44.3 33.5(L)  Platelets 150 - 400 K/uL - - 465(H)    RADS: N/a  Assessment:      Right breast mass, persistent pleural effusion  Plan:     Likely breast CA.  Can wait for fluid analysis for possible malignant cells prior to considering incisional biopsy due to the high chance of poor wound healing, risk of superinfection, and possible bleeding issues.  Due to the post persistent pleural effusion noted on previous x-ray, pigtail catheter was placed on a Pleur-evac device and approximately 3-1/2 L of dark fluid was drained.  Thoracic surgery was urgently consulted at this time to ensure no concern for active bleeding and possible surgical intervention to address such issue.    Patient had coughing episodes along with increased shoulder pain afterwards, and subsequent chest x-rays showed improve pleural effusion as well as reexpanding lung.  Since there was no sign of active bleeding after the initial drainage, decision was made with thoracic surgery to continue to monitor at this time.  Further supportive care per the hospitalist team as well as recommending an oncology consult to discuss plans moving forward.  Patient was made aware of the likely diagnosis and all questions were addressed at this time.

## 2020-06-16 NOTE — Progress Notes (Signed)
CODE SEPSIS tracking per [facility]

## 2020-06-16 NOTE — Progress Notes (Signed)
Pharmacy Antibiotic Note  Jessica Willis is a 54 y.o. female admitted on 06/15/2020 with sepsis secondary to cellulitis.  Pharmacy has been consulted for Vancomycin dosing. Patient with breast mass x 6 months but does not appear sought medical attention, now two masses with necrosis and purulence.  CTA chest also reveals large pleural effusion.  Likely breast cancer.    Today, 06/16/2020 Day 1 antibiotics  WBC remains mildly elevated  SCr WNL- -stable  No fevers  Pleural fluid consistent with exudate  Plan:  Based on indication, age, weight, renal function, adjust vancomycin to 1gm IV q12h for goal trough 10-15 mcg/mL  Monitor renal function  Check trough if remains on vancomycin >4-5 days  Ceftriaxone and azithromycin also ordered  F/u need for azithromycin and possibly add metronidazole for anaerobic coverage for necrotizing infection  Await pleural fluid and blood cultures  Height: 5\' 10"  (177.8 cm) Weight: 86.2 kg (190 lb) IBW/kg (Calculated) : 68.5  Temp (24hrs), Avg:98.2 F (36.8 C), Min:97.9 F (36.6 C), Max:98.4 F (36.9 C)  Recent Labs  Lab 06/15/20 2125 06/16/20 0034 06/16/20 0514  WBC 15.1* 13.0*  --   CREATININE 0.61 0.56  --   LATICACIDVEN 2.8* 2.5* 2.3*    Estimated Creatinine Clearance: 94.8 mL/min (by C-G formula based on SCr of 0.56 mg/dL).    No Known Allergies  Antimicrobials this admission: Ceftriaxone 11/3 >> Vancomycin 11/4 >> Azithromycin 11/4 >>  Dose adjustments this admission:   Microbiology results:  11/3 BCx:   11/3 UCx:    11/3 Pleural fluid:    MRSA PCR:   Thank you for allowing pharmacy to be a part of this patient's care.  Doreene Eland, PharmD, BCPS.   Work Cell: [phone] 06/16/2020 10:13 AM

## 2020-06-16 NOTE — Progress Notes (Signed)
CODE SEPSIS - PHARMACY COMMUNICATION  **Broad Spectrum Antibiotics should be administered within 1 hour of Sepsis diagnosis**  Time Code Sepsis Called/Page Received:  11/3 @ 2200  Antibiotics Ordered: Ceftriaxone, azithromycin, vanc   Time of 1st antibiotic administration:  Ceftriaxone 2 gm IV X 1 @ 11/03 @ 2236   Additional action taken by pharmacy: none  If necessary, Name of Provider/Nurse Contacted:     Kimmerly Lora D ,PharmD Clinical Pharmacist  06/16/2020  12:35 AM

## 2020-06-16 NOTE — Progress Notes (Signed)
PROGRESS NOTE    [name]  SEG:[id number] DOB: May 25, 1966 DOA: 06/15/2020 PCP: System, Provider Not In     Brief Narrative:  Jessica Willis is a 54 year old female with past medical history significant for anemia who presents with chief complaint of shortness of breath and cough.  She states that she has had some shortness of breath, especially worsening in the past week.  She also admits to having a large right breast mass that started about 6 months ago.  She has not sought any medical care for this.  In the emergency department, imaging revealed two separate right-sided breast masses, right axillary adenopathy, enlarged left axillary lymph node, large right-sided pleural effusion with mass-effect and left mediastinal shift, near collapse of the right lung.  She underwent thoracentesis with pigtail catheter placement in the emergency department.  Repeat chest x-ray revealed persistent right-sided pleural effusion.  She was started on IV antibiotics.  New events last 24 hours / Subjective: She admits to dry cough and continued shortness of breath.  Assessment & Plan:   Principal Problem:   Sepsis ([place]) Active Problems:   Hypokalemia   Breast mass   Mastitis   Pleural effusion, right   Large right pleural effusion -CTA chest: Large right pleural effusion which appears to have mass effect with complete collapse/compression of the right lung and shift of mediastinal structures to the left. -Status post thoracentesis with 500 cc serosanguineous fluid removed.  Pigtail catheter in place -Pleural fluid exudative, likely due to suspected malignancy. Pleural fluid culture pending. Cytology pending  -Repeat chest x-ray post thoracentesis: placement of right pigtail catheter with persistent complete opacification of the right hemithorax. There may be slight diminished mediastinal shift to the left. No visualized pneumothorax. -General surgery consulted, question if patient will  need a larger chest tube placement versus IR or cardiothoracic surgery involvement  Right breast mass -CTA chest: One, possibly 2 separate large masses within the right breast extending to the skin surface with associated adjacent skin thickening and right axillary adenopathy. Findings compatible with right breast cancer. Single enlarged left axillary lymph node. -General surgery consulted -Pending surgical evaluation, will need oncology consultation -Consulted palliative care medicine for goals of care discussion  Acute hypoxemic respiratory failure -Secondary to pleural effusion as above -Continue nasal cannula O2 to maintain sat greater than 92%.  Remains on 2 L nasal cannula O2 at present  Sepsis secondary to right breast cellulitis -Sepsis present on admission with tachycardia, tachypnea, leukocytosis -Concern for cellulitis overlying breast mass with purulent discharge -Continue IV vanco   Hypokalemia -Replace, trend   DVT prophylaxis:  enoxaparin (LOVENOX) injection 40 mg Start: 06/16/20 0009  Code Status: Full code Family Communication: No family at bedside Disposition Plan:  Status is: Inpatient  Remains inpatient appropriate because:Hemodynamically unstable, Ongoing diagnostic testing needed not appropriate for outpatient work up, IV treatments appropriate due to intensity of illness or inability to take PO and Inpatient level of care appropriate due to severity of illness   Dispo: The patient is from: Home              Anticipated d/c is to: Home              Anticipated d/c date is: > 3 days              Patient currently is not medically stable to d/c.  Remains ill with large right pleural effusion.  Also remains on nasal cannula O2.  IV antibiotics.  General surgery consultation  pending    Consultants:   General surgery  Palliative care medicine  Procedures:   Thoracentesis in the ER 11/3    Antimicrobials:  Anti-infectives (From admission, onward)    Start     Dose/Rate Route Frequency Ordered Stop   06/16/20 2200  vancomycin (VANCOCIN) IVPB 1000 mg/200 mL premix        1,000 mg 200 mL/hr over 60 Minutes Intravenous Every 12 hours 06/16/20 1005     06/16/20 0900  vancomycin (VANCOCIN) IVPB 1000 mg/200 mL premix  Status:  Discontinued        1,000 mg 200 mL/hr over 60 Minutes Intravenous Every 8 hours 06/16/20 0102 06/16/20 1005   06/16/20 0045  vancomycin (VANCOREADY) IVPB 2000 mg/400 mL        2,000 mg 200 mL/hr over 120 Minutes Intravenous  Once 06/16/20 0031 06/16/20 0307   06/16/20 0015  vancomycin (VANCOCIN) IVPB 1000 mg/200 mL premix  Status:  Discontinued        1,000 mg 200 mL/hr over 60 Minutes Intravenous  Once 06/16/20 0010 06/16/20 0031   06/16/20 0009  cefTRIAXone (ROCEPHIN) 2 g in sodium chloride 0.9 % 100 mL IVPB  Status:  Discontinued        2 g 200 mL/hr over 30 Minutes Intravenous Every 24 hours 06/16/20 0010 06/16/20 0034   06/15/20 2215  cefTRIAXone (ROCEPHIN) 2 g in sodium chloride 0.9 % 100 mL IVPB        2 g 200 mL/hr over 30 Minutes Intravenous Every 24 hours 06/15/20 2200     06/15/20 2215  azithromycin (ZITHROMAX) 500 mg in sodium chloride 0.9 % 250 mL IVPB        500 mg 250 mL/hr over 60 Minutes Intravenous Every 24 hours 06/15/20 2200          Objective: Vitals:   06/16/20 0809 06/16/20 0846 06/16/20 0900 06/16/20 1000  BP: (!) 136/113 (!) 146/68    Pulse: (!) 109 (!) 113 (!) 114 (!) 114  Resp: (!) 23 (!) 24 20 (!) 22  Temp: 97.9 F (36.6 C) 98.4 F (36.9 C)    TempSrc: Oral Oral    SpO2: 96% 99% 97% 100%  Weight:      Height:       No intake or output data in the 24 hours ending 06/16/20 1048 Filed Weights   06/15/20 2114  Weight: 86.2 kg    Examination:  General exam: Appears calm  Respiratory system: Without respiratory distress, diminished breath sounds right, on nasal cannula O2 Cardiovascular system: S1 & S2 heard, tachycardic, regular rhythm. No murmurs. No pedal  edema. Gastrointestinal system: Abdomen is nondistended, soft and nontender. Normal bowel sounds heard. Central nervous system: Alert and oriented. No focal neurological deficits. Speech clear.  Extremities: Symmetric in appearance  Skin:    Psychiatry: Judgement and insight appear poor. Mood & affect flat.   Data Reviewed: I have personally reviewed following labs and imaging studies  CBC: Recent Labs  Lab 06/15/20 2125 06/16/20 0034  WBC 15.1* 13.0*  NEUTROABS 12.3*  --   HGB 11.5* 10.3*  HCT 36.3 33.5*  MCV 82.1 83.1  PLT 568* 062*   Basic Metabolic Panel: Recent Labs  Lab 06/15/20 2125 06/16/20 0034  NA 143 140  K 3.3* 3.3*  CL 101 100  CO2 25 27  GLUCOSE 146* 124*  BUN 22* 20  CREATININE 0.61 0.56  CALCIUM 9.5 9.1  MG 2.2  --    GFR: Estimated Creatinine Clearance:  94.8 mL/min (by C-G formula based on SCr of 0.56 mg/dL). Liver Function Tests: Recent Labs  Lab 06/15/20 2125 06/16/20 0034  AST 43* 38  ALT 29 27  ALKPHOS 107 102  BILITOT 1.0 0.8  PROT 9.0* 8.8*  ALBUMIN 4.0 3.9  3.9   No results for input(s): LIPASE, AMYLASE in the last 168 hours. No results for input(s): AMMONIA in the last 168 hours. Coagulation Profile: Recent Labs  Lab 06/16/20 0034  INR 1.2   Cardiac Enzymes: No results for input(s): CKTOTAL, CKMB, CKMBINDEX, TROPONINI in the last 168 hours. BNP (last 3 results) No results for input(s): PROBNP in the last 8760 hours. HbA1C: No results for input(s): HGBA1C in the last 72 hours. CBG: No results for input(s): GLUCAP in the last 168 hours. Lipid Profile: No results for input(s): CHOL, HDL, LDLCALC, TRIG, CHOLHDL, LDLDIRECT in the last 72 hours. Thyroid Function Tests: No results for input(s): TSH, T4TOTAL, FREET4, T3FREE, THYROIDAB in the last 72 hours. Anemia Panel: No results for input(s): VITAMINB12, FOLATE, FERRITIN, TIBC, IRON, RETICCTPCT in the last 72 hours. Sepsis Labs: Recent Labs  Lab 06/15/20 2125  06/16/20 0034 06/16/20 0514  PROCALCITON 24.07 21.52  --   LATICACIDVEN 2.8* 2.5* 2.3*    Recent Results (from the past 240 hour(s))  Respiratory Panel by RT PCR (Flu A&B, Covid) - Nasopharyngeal Swab     Status: None   Collection Time: 06/15/20  9:25 PM   Specimen: Nasopharyngeal Swab  Result Value Ref Range Status   SARS Coronavirus 2 by RT PCR NEGATIVE NEGATIVE Final    Comment: (NOTE) SARS-CoV-2 target nucleic acids are NOT DETECTED.  The SARS-CoV-2 RNA is generally detectable in upper respiratoy specimens during the acute phase of infection. The lowest concentration of SARS-CoV-2 viral copies this assay can detect is 131 copies/mL. A negative result does not preclude SARS-Cov-2 infection and should not be used as the sole basis for treatment or other patient management decisions. A negative result may occur with  improper specimen collection/handling, submission of specimen other than nasopharyngeal swab, presence of viral mutation(s) within the areas targeted by this assay, and inadequate number of viral copies (<131 copies/mL). A negative result must be combined with clinical observations, patient history, and epidemiological information. The expected result is Negative.  Fact Sheet for Patients:  PinkCheek.be  Fact Sheet for Healthcare Providers:  GravelBags.it  This test is no t yet approved or cleared by the [place] FDA and  has been authorized for detection and/or diagnosis of SARS-CoV-2 by FDA under an Emergency Use Authorization (EUA). This EUA will remain  in effect (meaning this test can be used) for the duration of the COVID-19 declaration under Section 564(b)(1) of the Act, 21 U.S.C. section 360bbb-3(b)(1), unless the authorization is terminated or revoked sooner.     Influenza A by PCR NEGATIVE NEGATIVE Final   Influenza B by PCR NEGATIVE NEGATIVE Final    Comment: (NOTE) The Xpert Xpress  SARS-CoV-2/FLU/RSV assay is intended as an aid in  the diagnosis of influenza from Nasopharyngeal swab specimens and  should not be used as a sole basis for treatment. Nasal washings and  aspirates are unacceptable for Xpert Xpress SARS-CoV-2/FLU/RSV  testing.  Fact Sheet for Patients: PinkCheek.be  Fact Sheet for Healthcare Providers: GravelBags.it  This test is not yet approved or cleared by the [place] FDA and  has been authorized for detection and/or diagnosis of SARS-CoV-2 by  FDA under an Emergency Use Authorization (EUA). This EUA will remain  in effect (meaning this test can be used) for the duration of the  Covid-19 declaration under Section 564(b)(1) of the Act, 21  U.S.C. section 360bbb-3(b)(1), unless the authorization is  terminated or revoked. Performed at [facility], [address] [place], [place] [address]   Blood Culture (routine x 2)     Status: None (Preliminary result)   Collection Time: 06/15/20 10:09 PM   Specimen: BLOOD  Result Value Ref Range Status   Specimen Description BLOOD LEFT AC  Final   Special Requests   Final    BOTTLES DRAWN AEROBIC AND ANAEROBIC Blood Culture results may not be optimal due to an inadequate volume of blood received in culture bottles   Culture   Final    NO GROWTH < 12 HOURS Performed at [facility], [address] [place], [place] [address]    Report Status PENDING  Incomplete  Blood Culture (routine x 2)     Status: None (Preliminary result)   Collection Time: 06/15/20 10:11 PM   Specimen: BLOOD  Result Value Ref Range Status   Specimen Description BLOOD RIGHT [facility]  Final   Special Requests   Final    BOTTLES DRAWN AEROBIC AND ANAEROBIC Blood Culture adequate volume   Culture   Final    NO GROWTH < 12 HOURS Performed at [facility], [address] [place], [place] [address]    Report Status PENDING  Incomplete  Pleural  Fluid culture (includes gram stain)     Status: None (Preliminary result)   Collection Time: 06/15/20 11:40 PM   Specimen: Pleural Fluid  Result Value Ref Range Status   Specimen Description   Final    PLEURAL Performed at [facility], [address] [place], [place] [address]    Special Requests   Final    NONE Performed at [facility], [address] [place], [place] [address]    Gram Stain   Final    NO WBC SEEN NO ORGANISMS SEEN Performed at [facility], [place] [address], [place],  [address]    Culture PENDING  Incomplete   Report Status PENDING  Incomplete      Radiology Studies: DG Chest 1 View  Result Date: 06/15/2020 CLINICAL DATA:  Short of breath, cough EXAM: CHEST  1 VIEW COMPARISON:  06/29/2015 FINDINGS: 2 frontal views of the chest demonstrate complete opacification of the right hemithorax, with slight mediastinal shift to the left, consistent with large right pleural effusion and underlying lung consolidation. Chronic areas of scarring are seen within the left chest. No pneumothorax. Cardiac silhouette is unremarkable. No acute bony abnormalities. IMPRESSION: 1. Opacification of the right hemithorax consistent with large right pleural effusion and underlying right lung consolidation. Electronically Signed   By: Randa Ngo M.D.   On: 06/15/2020 21:42   CT Angio Chest PE W and/or Wo Contrast  Result Date: 06/15/2020 CLINICAL DATA:  Cough, shortness of breath EXAM: CT ANGIOGRAPHY CHEST WITH CONTRAST TECHNIQUE: Multidetector CT imaging of the chest was performed using the standard protocol during bolus administration of intravenous contrast. Multiplanar CT image reconstructions and MIPs were obtained to evaluate the vascular anatomy. CONTRAST:  100 mL Omnipaque 350 IV COMPARISON:  06/30/2015 FINDINGS: Cardiovascular: No filling defects in the pulmonary arteries to suggest pulmonary emboli. Heart is normal size. Aorta normal caliber.  Mediastinum/Nodes: No visible mediastinal or hilar adenopathy. Bilateral axillary adenopathy, right greater than left. Index right axillary lymph node has a short axis diameter  of 13 mm. Left axillary lymph node has a short axis diameter of 11 mm. Lungs/Pleura: Large right pleural effusion completely compressed seen in the right lung which is not aerated. This appears to have mass effect with shift of the right lung and mediastinal structures to the left. Areas of atelectasis or scarring in the left lung base. No effusion on the left. Upper Abdomen: Imaging into the upper abdomen demonstrates no acute findings. Musculoskeletal: Large mass noted in the right breast measuring 8 x 6 cm. Possible separate right breast mass more inferiorly in the right breast measuring 8 x 4 cm. Skin thickening or edema noted. Associated right axillary adenopathy. A few ill-defined sclerotic areas within the thoracic spine could reflect metastases. Review of the MIP images confirms the above findings. IMPRESSION: No evidence of pulmonary embolus. One, possibly 2 separate large masses within the right breast extending to the skin surface with associated adjacent skin thickening and right axillary adenopathy. Findings compatible with right breast cancer. Large right pleural effusion which appears to have mass effect with complete collapse/compression of the right lung and shift of mediastinal structures to the left. Single enlarged left axillary lymph node. These results were called by telephone at the time of interpretation on 06/15/2020 at 10:43 pm to provider [facility] , who verbally acknowledged these results. Electronically Signed   By: Rolm Baptise M.D.   On: 06/15/2020 22:46   US Venous Img Lower Unilateral Left (DVT)  Result Date: 06/16/2020 CLINICAL DATA:  Initial evaluation for acute left lower extremity edema. EXAM: LEFT LOWER EXTREMITY VENOUS DOPPLER ULTRASOUND TECHNIQUE: Gray-scale sonography with graded compression, as  well as color Doppler and duplex ultrasound were performed to evaluate the lower extremity deep venous systems from the level of the common femoral vein and including the common femoral, femoral, profunda femoral, popliteal and calf veins including the posterior tibial, peroneal and gastrocnemius veins when visible. The superficial great saphenous vein was also interrogated. Spectral Doppler was utilized to evaluate flow at rest and with distal augmentation maneuvers in the common femoral, femoral and popliteal veins. COMPARISON:  None. FINDINGS: Contralateral Common Femoral Vein: Respiratory phasicity is normal and symmetric with the symptomatic side. No evidence of thrombus. Normal compressibility. Common Femoral Vein: No evidence of thrombus. Normal compressibility, respiratory phasicity and response to augmentation. Saphenofemoral Junction: No evidence of thrombus. Normal compressibility and flow on color Doppler imaging. Profunda Femoral Vein: No evidence of thrombus. Normal compressibility and flow on color Doppler imaging. Femoral Vein: No evidence of thrombus. Normal compressibility, respiratory phasicity and response to augmentation. Popliteal Vein: No evidence of thrombus. Normal compressibility, respiratory phasicity and response to augmentation. Calf Veins: No evidence of thrombus. Normal compressibility and flow on color Doppler imaging. Superficial Great Saphenous Vein: No evidence of thrombus. Normal compressibility. Venous Reflux:  None. Other Findings:  None. IMPRESSION: No evidence of deep venous thrombosis. Electronically Signed   By: Jeannine Boga M.D.   On: 06/16/2020 01:30   DG Chest Portable 1 View  Result Date: 06/15/2020 CLINICAL DATA:  Right chest tube placement. EXAM: PORTABLE CHEST 1 VIEW COMPARISON:  Radiograph and CT earlier today. FINDINGS: Placement of right pigtail catheter. There is persistent complete opacification of the right hemithorax. There may be slight diminished  mediastinal shift to the left. No visualized pneumothorax. No focal abnormality in the left lung. IMPRESSION: Placement of right pigtail catheter with persistent complete opacification of the right hemithorax. There may be slight diminished mediastinal shift to the left. No visualized pneumothorax. Electronically  Signed   By: Keith Rake M.D.   On: 06/15/2020 23:40      Scheduled Meds: . enoxaparin (LOVENOX) injection  40 mg Subcutaneous Q24H   Continuous Infusions: . azithromycin Stopped (06/16/20 0139)  . cefTRIAXone (ROCEPHIN)  IV Stopped (06/15/20 2337)  . lactated ringers 125 mL/hr at 06/16/20 0847  . vancomycin       LOS: 1 day      Time spent: 40 minutes   Dessa Phi, DO [facility] 06/16/2020, 10:48 AM   Available via Epic secure chat 7am-7pm After these hours, please refer to coverage provider listed on [email or web address]

## 2020-06-16 NOTE — ED Notes (Signed)
Assisted pt with bedpan to void. ~400 mL urine voided.

## 2020-06-16 NOTE — Consult Note (Addendum)
NAME:  Jessica Willis, MRN:  [id number], DOB:  1965-12-15, LOS: 1 ADMISSION DATE:  06/15/2020, CONSULTATION DATE:  06/16/2020 REFERRING MD:  Dr. Maylene Roes, CHIEF COMPLAINT: Shortness of Breath, cough   Brief History   54 y.o. Female admitted with Acute Hypoxic Respiratory Failure in the setting of Large Right Pleural Effusion (suspected malignant pleural effusion) due to Right Breast Mass and Sepsis in the setting of Right Breast Cellulitis.  Pigtail catheter placed in the ED. Cardiothoracic and General Surgery following.  History of present illness   Jessica Willis is a 54 y.o. Female with a past medical history of anemia, who presented to [facility] ED on 06/15/20 due to a 1 week history of progressive shortness of breath and cough.  She also reported bone swelling of the right breast for at least 6 months, but did not seek any medical attention.  Upon presentation she was noted to have a large right breast mass with a duplicate mass above the breast which looked necrotic with bullae and purulent discharge.  ED Course: Temperature 98.2 blood pressure 180/107 pulse 144 respirate 38 oxygen sat 94% on room air.  White count 15.1 hemoglobin 11.4 and platelets count of 565.  Sodium is 143 potassium 3.3 chloride 101 CO2 25 BUN 22 and creatinine was 0.61.  Lactic acid 2.8 glucose 146 and calcium 9.5.  CT angiogram of the chest shows no evidence of PE.  There was one possibily 2 separate large mass seen within the right breast extending to the skin surface was associated skin thickening or right axillary adenopathy.  Findings are consistent with right breast cancer.  Is also a large right pleural effusion with mass-effect with complete collapse of the right lung and shift of mediastinal structures the left.  There is a single enlarged left axillary lymph node.  Thoracentesis was performed with insertion of pigtail catheter in the ER, which yielded 500 cc of serosanguineous fluid removed.  She was admitted by  the Hospitalist for further workup and treatment of  Acute Hypoxic Respiratory Failure in the setting of Large Right Pleural Effusion (suspected malignant pleural effusion) due to Right Breast Mass and Sepsis in the setting of Right Breast Cellulitis.  On 06/16/20 she became tachycardic with RR in the 20's and worsening hypoxia.  Follow-up chest x-ray showed persistent complete opacification of the right hemithorax, along with slight mediastinal shift to the left.  General Surgery was consulted to evaluate need for larger chest tube placement. General Surgery placed pigtail to suction, which drained 3.5L dark Serosanguinous fluid (did not appear like fresh blood).  Cardiothoracic Surgery was consulted to ensue no concern for active bleeding or need for possible surgical intervention.  Follow up chest x-rays showed that the pleural effusion was improving, and output has slowed down considerably.  Given her high risk of decompensation, she was changed to Stepdown status with PCCM consultation.  Oncology and Palliative Care have also been consulted.   Past Medical History  Anemia  Significant Hospital Events   11/3: Presented to ED, to be admitted by Hospitalist  Consults:  Hospitalist (Primary Service) Cardiothoracic Surgery General Surgery Oncology Palliative Care PCCM  Procedures:  11/3: Right Pigtail Catheter inserted  Significant Diagnostic Tests:  11/3: CTA Chest>>No evidence of pulmonary embolus. One, possibly 2 separate large masses within the right breast extending to the skin surface with associated adjacent skin thickening and right axillary adenopathy. Findings compatible with right breast cancer. Large right pleural effusion which appears to have mass effect with complete collapse/compression  of the right lung and shift of mediastinal structures to the left. Single enlarged left axillary lymph node. 11/3: CXR>>Placement of right pigtail catheter with persistent  complete opacification of the right hemithorax. There may be slight diminished mediastinal shift to the left. No visualized pneumothorax. 11/4: Venous US Left LE>>No evidence of deep venous thrombosis. 11/4: CXR>>1. Small right pneumothorax with a right-sided pleural pigtail catheter in place. 2. Interval decrease in a large right pleural fluid collection (effusion versus hemothorax). 3. Severe airspace and interstitial opacities on the right. Mild interstitial opacities in the left. 11/4: CXR>>1. Interval improvement in aeration of the right lung with decreased size of a right hemothorax. 2. Unchanged small right pneumothorax with pleural pigtail catheter in place. 3. Severe right atelectasis/airspace disease.  Micro Data:  11/3: SARS-CoV-2 PCR>> negative 11/3: Influenza PCR>> negative 11/3: HIV Screen>> non-reactive 11/3: Blood culture x2>> 11/3: Urine culture>> 11/3: Pleural Fluid Culture>>  Antimicrobials:  Azithromycin 11/4>>11/4 Ceftriaxone 11/3>>11/4 Vancomycin 11/4>>  Interim history/subjective:  Currently on 8L [place] Hemodynamically stable on pressors; Afebrile Currently denies chest pain, SOB, abdominal pain, N/V/D, fever/chills  Objective   Blood pressure 139/88, pulse (!) 137, temperature 99.5 F (37.5 C), temperature source Axillary, resp. rate (!) 23, height 5\' 10"  (1.778 m), weight 86.2 kg, last menstrual period 06/16/2015, SpO2 96 %.        Intake/Output Summary (Last 24 hours) at 06/16/2020 2030 Last data filed at 06/16/2020 1501 Gross per 24 hour  Intake 1000 ml  Output 3750 ml  Net -2750 ml   Filed Weights   06/15/20 2114  Weight: 86.2 kg    Examination: General: Acutely ill appearing female, sitting in bed, on 8L [place], in NAD HENT: Atraumatic, normocephalic, neck supple, no JVD Lungs: Clear to auscultation bilaterally, even, nonlabored, normal effort Cardiovascular: Tachycardia, regular rhythm, s1s2, no M/R/G, 2+ distal pulses Abdomen: Soft,  nontender, nondistended, no guarding or rebound tenderness, BS+ x4 Extremities: Normal bulk and tone, no deformities, no edema Neuro: Awake, A&O x4, follows commands, no focal deficits, speech clear Skin: Warm and dry.  Right breast mass (see image below)     Resolved Hospital Problem list   N/A  Assessment & Plan:   Acute Hypoxic Respiratory Failure in the setting of large Right Pleural Effusion (suspected malignant pleural effusion) -Initial concern for possible Hemithorax, fluid from pigtail catheter with Serosanguinous (didn't appear like fresh blood) drainage which has slowed considerably, H&H has remained stable ~ thus suspicion is for Malignant Pleural Effusion -Supplemental O2 as needed to maintain O2 sats >92% -Follow intermittent CXR & ABG as needed -High risk for decompensation and intubation -S/p Thoracentesis & Pigtail Catheter placement -Cardiothoracic/General Surgery following, chest tube as per Recommendations   Right Breast Mass -Oncology following, appreciate input -Cytology pending   Sepsis secondary to Right Breast Cellulitis -Monitor fever curve -Trend WBC's & Procalcitonin -Follow cultures as above -Continue Vancomycin   Hypokalemia -Monitor I&O's / urinary output -Follow BMP -Ensure adequate renal perfusion -Avoid nephrotoxic agents as able -Replace electrolytes as indicated    Best practice:  Diet: Regular Diet Pain/Anxiety/Delirium protocol (if indicated): Prn Morphine VAP protocol (if indicated): N/A DVT prophylaxis: Lovenox GI prophylaxis: N/A Glucose control: N/A Mobility: As tolerated Code Status: Full Code Family Communication: Updated pt at bedside 06/16/20 (no family present) Disposition: Stepdown   Labs   CBC: Recent Labs  Lab 06/15/20 2125 06/16/20 0034 06/16/20 1537 06/16/20 1636  WBC 15.1* 13.0*  --   --   NEUTROABS 12.3*  --   --   --  HGB 11.5* 10.3* 14.0 14.6  HCT 36.3 33.5* 44.3 46.0  MCV 82.1 83.1  --   --    PLT 568* 465*  --   --     Basic Metabolic Panel: Recent Labs  Lab 06/15/20 2125 06/16/20 0034  NA 143 140  K 3.3* 3.3*  CL 101 100  CO2 25 27  GLUCOSE 146* 124*  BUN 22* 20  CREATININE 0.61 0.56  CALCIUM 9.5 9.1  MG 2.2  --    GFR: Estimated Creatinine Clearance: 94.8 mL/min (by C-G formula based on SCr of 0.56 mg/dL). Recent Labs  Lab 06/15/20 2125 06/16/20 0034 06/16/20 0514  PROCALCITON 24.07 21.52  --   WBC 15.1* 13.0*  --   LATICACIDVEN 2.8* 2.5* 2.3*    Liver Function Tests: Recent Labs  Lab 06/15/20 2125 06/16/20 0034  AST 43* 38  ALT 29 27  ALKPHOS 107 102  BILITOT 1.0 0.8  PROT 9.0* 8.8*  ALBUMIN 4.0 3.9  3.9   No results for input(s): LIPASE, AMYLASE in the last 168 hours. No results for input(s): AMMONIA in the last 168 hours.  ABG    Component Value Date/Time   HCO3 28.2 (H) 06/15/2020 2125   O2SAT 67.2 06/15/2020 2125     Coagulation Profile: Recent Labs  Lab 06/16/20 0034  INR 1.2    Cardiac Enzymes: No results for input(s): CKTOTAL, CKMB, CKMBINDEX, TROPONINI in the last 168 hours.  HbA1C: No results found for: HGBA1C  CBG: No results for input(s): GLUCAP in the last 168 hours.  Review of Systems:   Positives in BOLD: Pt denies all complaints Gen: Denies fever, chills, weight change, fatigue, night sweats HEENT: Denies blurred vision, double vision, hearing loss, tinnitus, sinus congestion, rhinorrhea, sore throat, neck stiffness, dysphagia PULM: Denies shortness of breath, cough, sputum production, hemoptysis, wheezing CV: Denies chest pain, edema, orthopnea, paroxysmal nocturnal dyspnea, palpitations GI: Denies abdominal pain, nausea, vomiting, diarrhea, hematochezia, melena, constipation, change in bowel habits GU: Denies dysuria, hematuria, polyuria, oliguria, urethral discharge Endocrine: Denies hot or cold intolerance, polyuria, polyphagia or appetite change Derm: Denies rash, dry skin, scaling or peeling skin  change Heme: Denies easy bruising, bleeding, bleeding gums Neuro: Denies headache, numbness, weakness, slurred speech, loss of memory or consciousness   Past Medical History  She,  has a past medical history of Anemia and Patient denies medical problems.   Surgical History    Past Surgical History:  Procedure Laterality Date  . TUBAL LIGATION       Social History   reports that she has never smoked. She has never used smokeless tobacco. She reports previous alcohol use of about 1.0 standard drink of alcohol per week. She reports that she does not use drugs.   Family History   Her family history includes Diabetes in an other family member; Hypertension in an other family member.   Allergies No Known Allergies   Home Medications  Prior to Admission medications   Not on File     Critical care time: 40 minutes     Darel Hong, [facility] [place] Pager: [phone]

## 2020-06-16 NOTE — ED Notes (Signed)
Walked pleural fluid, labeled with pt labels,  down to lab and handed to lab tech

## 2020-06-16 NOTE — ED Notes (Signed)
Removed purewick as pt requested to go to the bathroom instead.  Admitting MD came to see pt.  Dressing on chest tube is Microfoam, per pt this was applied by MD.

## 2020-06-16 NOTE — ED Notes (Signed)
PT does not wish to keep BP cuff on

## 2020-06-16 NOTE — Progress Notes (Signed)
  PROGRESS NOTE  Re-evaluated patient this afternoon after my discussion with Dr. Lysle Pearl. Patient's chest tube was connected to pleurvac and over 3.7L old blood was drained. Dr. Genevive Bi, thoracic surgery, has been consulted to co-manage.  On my evaluation, patient is tachycardic with heart rate 120-130s, respiratory rate in the 20s.  She is on 8 L of humidified nasal cannula O2.  She is ill-appearing, although no acute indication for urgent intubation at this time.  I have changed patient's status to stepdown and consulted Dr. Mortimer Fries (ICU).  Dr. Tasia Catchings with oncology consulted as well. Patient remains at high risk of decompensation.   I update son as well as patient's mother. I could not reach daughter via phone.   Dessa Phi, DO [facility] 06/16/2020, 4:07 PM  Available via Epic secure chat 7am-7pm After these hours, please refer to coverage provider listed on [email or web address]

## 2020-06-16 NOTE — ED Notes (Signed)
Pt with chest tube to 20 cm water seal. Pt alert. Lab called for stat blood draw, IV team consult placed; This RN and charge RN attempted 2x each without success to start IV and draw blood.

## 2020-06-17 ENCOUNTER — Inpatient Hospital Stay: Payer: Medicaid Other

## 2020-06-17 DIAGNOSIS — A419 Sepsis, unspecified organism: Principal | ICD-10-CM

## 2020-06-17 DIAGNOSIS — D72829 Elevated white blood cell count, unspecified: Secondary | ICD-10-CM

## 2020-06-17 DIAGNOSIS — Z7189 Other specified counseling: Secondary | ICD-10-CM

## 2020-06-17 DIAGNOSIS — Z515 Encounter for palliative care: Secondary | ICD-10-CM

## 2020-06-17 LAB — CBC
HCT: 46.9 % — ABNORMAL HIGH (ref 36.0–46.0)
Hemoglobin: 14.7 g/dL (ref 12.0–15.0)
MCH: 25.5 pg — ABNORMAL LOW (ref 26.0–34.0)
MCHC: 31.3 g/dL (ref 30.0–36.0)
MCV: 81.4 fL (ref 80.0–100.0)
Platelets: 438 10*3/uL — ABNORMAL HIGH (ref 150–400)
RBC: 5.76 MIL/uL — ABNORMAL HIGH (ref 3.87–5.11)
RDW: 12.8 % (ref 11.5–15.5)
WBC: 18.7 10*3/uL — ABNORMAL HIGH (ref 4.0–10.5)
nRBC: 0 % (ref 0.0–0.2)

## 2020-06-17 LAB — BASIC METABOLIC PANEL
Anion gap: 14 (ref 5–15)
BUN: 17 mg/dL (ref 6–20)
CO2: 19 mmol/L — ABNORMAL LOW (ref 22–32)
Calcium: 9 mg/dL (ref 8.9–10.3)
Chloride: 102 mmol/L (ref 98–111)
Creatinine, Ser: 0.75 mg/dL (ref 0.44–1.00)
GFR, Estimated: >60 mL/min (ref >60)
Glucose, Bld: 119 mg/dL — ABNORMAL HIGH (ref 70–99)
Potassium: 4.4 mmol/L (ref 3.5–5.1)
Sodium: 135 mmol/L (ref 135–145)

## 2020-06-17 LAB — URINE CULTURE: Culture: NO GROWTH

## 2020-06-17 LAB — MRSA PCR SCREENING: MRSA by PCR: NEGATIVE

## 2020-06-17 LAB — MAGNESIUM: Magnesium: 2 mg/dL (ref 1.7–2.4)

## 2020-06-17 LAB — LACTIC ACID, PLASMA: Lactic Acid, Venous: 2.3 mmol/L (ref 0.5–1.9)

## 2020-06-17 LAB — STREP PNEUMONIAE URINARY ANTIGEN: Strep Pneumo Urinary Antigen: NEGATIVE

## 2020-06-17 IMAGING — DX DG CHEST 1V PORT
1 series · 1 of 1 positions shown · non-contrast
Comparison: Chest x-ray [DATE] [DATE] a.m.

CLINICAL DATA: Chest x-ray [DATE] [DATE] a.m.

EXAM:
PORTABLE CHEST 1 VIEW

[chest ap]
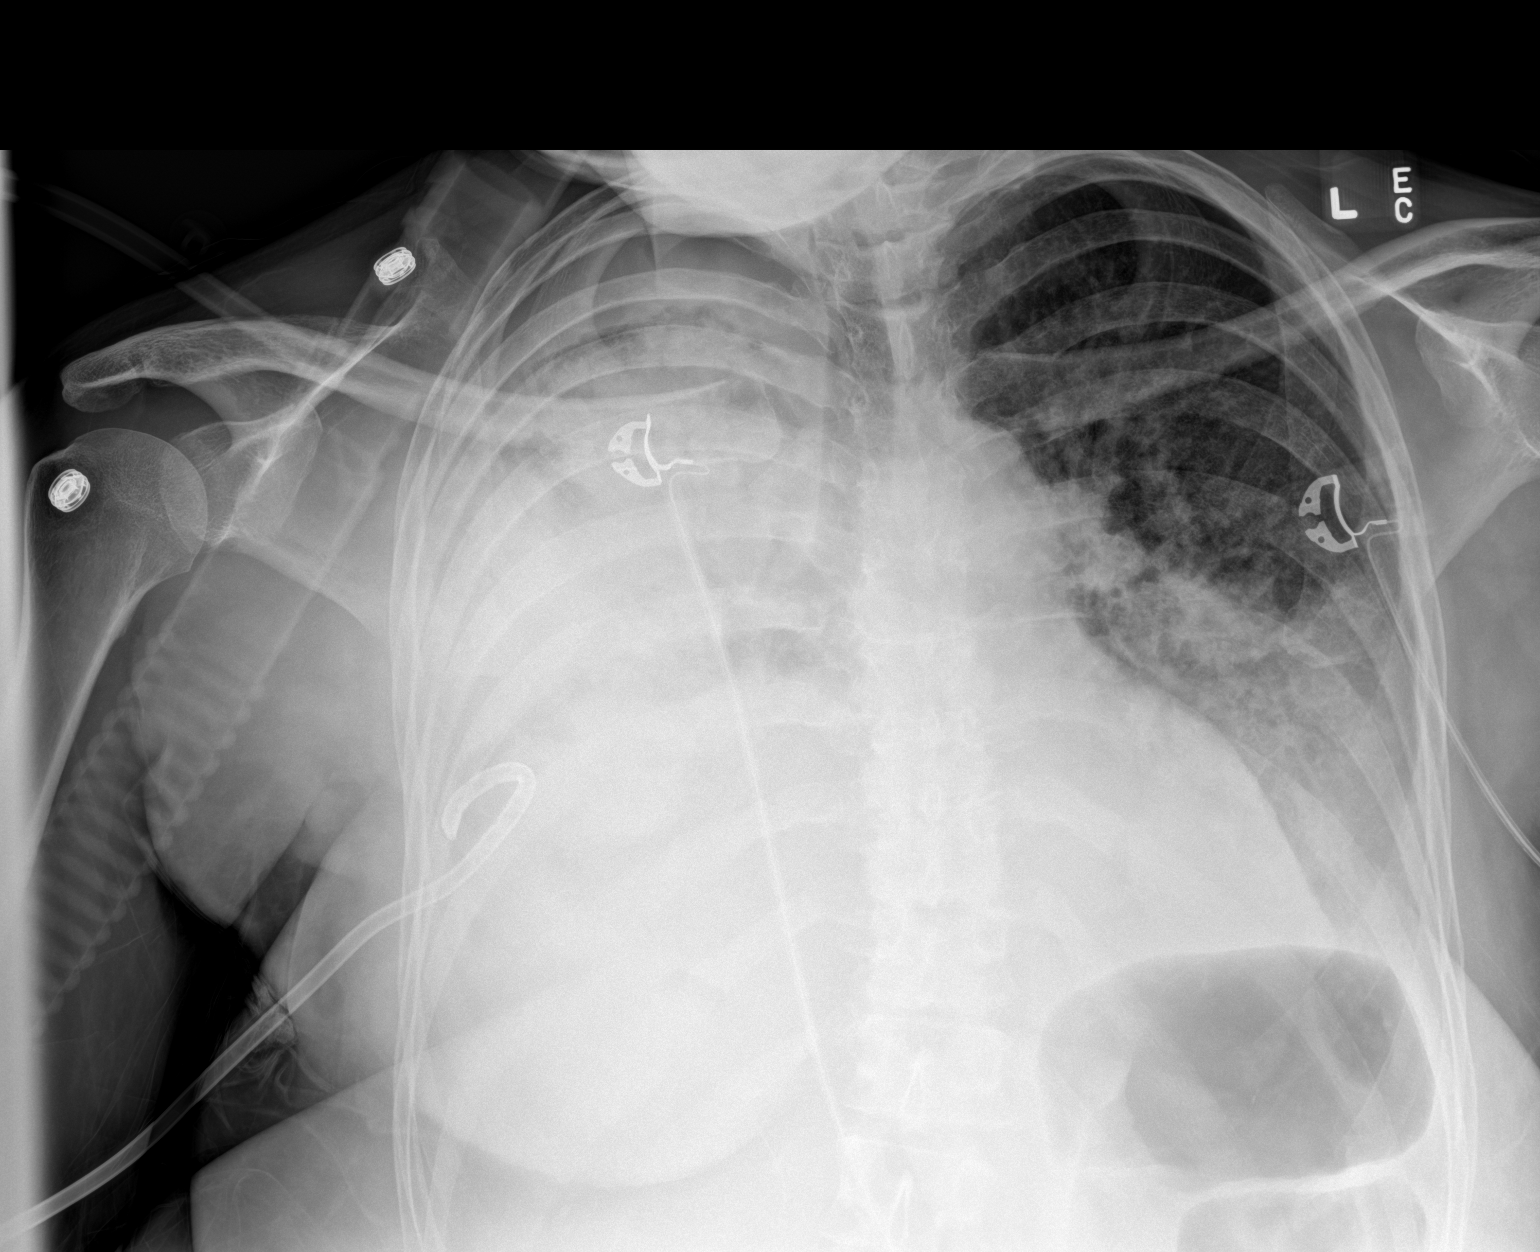

[1 of 1 positions shown; findings below may reference images not displayed]

FINDINGS: Interval increase in size of a likely large right pleural effusion
with slight aeration of the right lung. Likely trace pleural
effusion on the left. Interval increase in airspace interstitial
opacities of the left lung that is most prominent in the left mid
lung zone.

Right pigtail chest tube overlies the right hemithorax.
IMPRESSION: 1. Slightly improved almost complete whiteout of the right chest
with slightly decreased in size large pleural effusion.
2. Interval increase in interstitial and alveolar left lung
opacities with possible trace left pleural effusion.

## 2020-06-17 IMAGING — DX DG CHEST 1V PORT
1 series · 1 of 1 positions shown · non-contrast
Comparison: Yesterday

CLINICAL DATA: Pleural effusion

EXAM:
PORTABLE CHEST 1 VIEW

[chest ap]
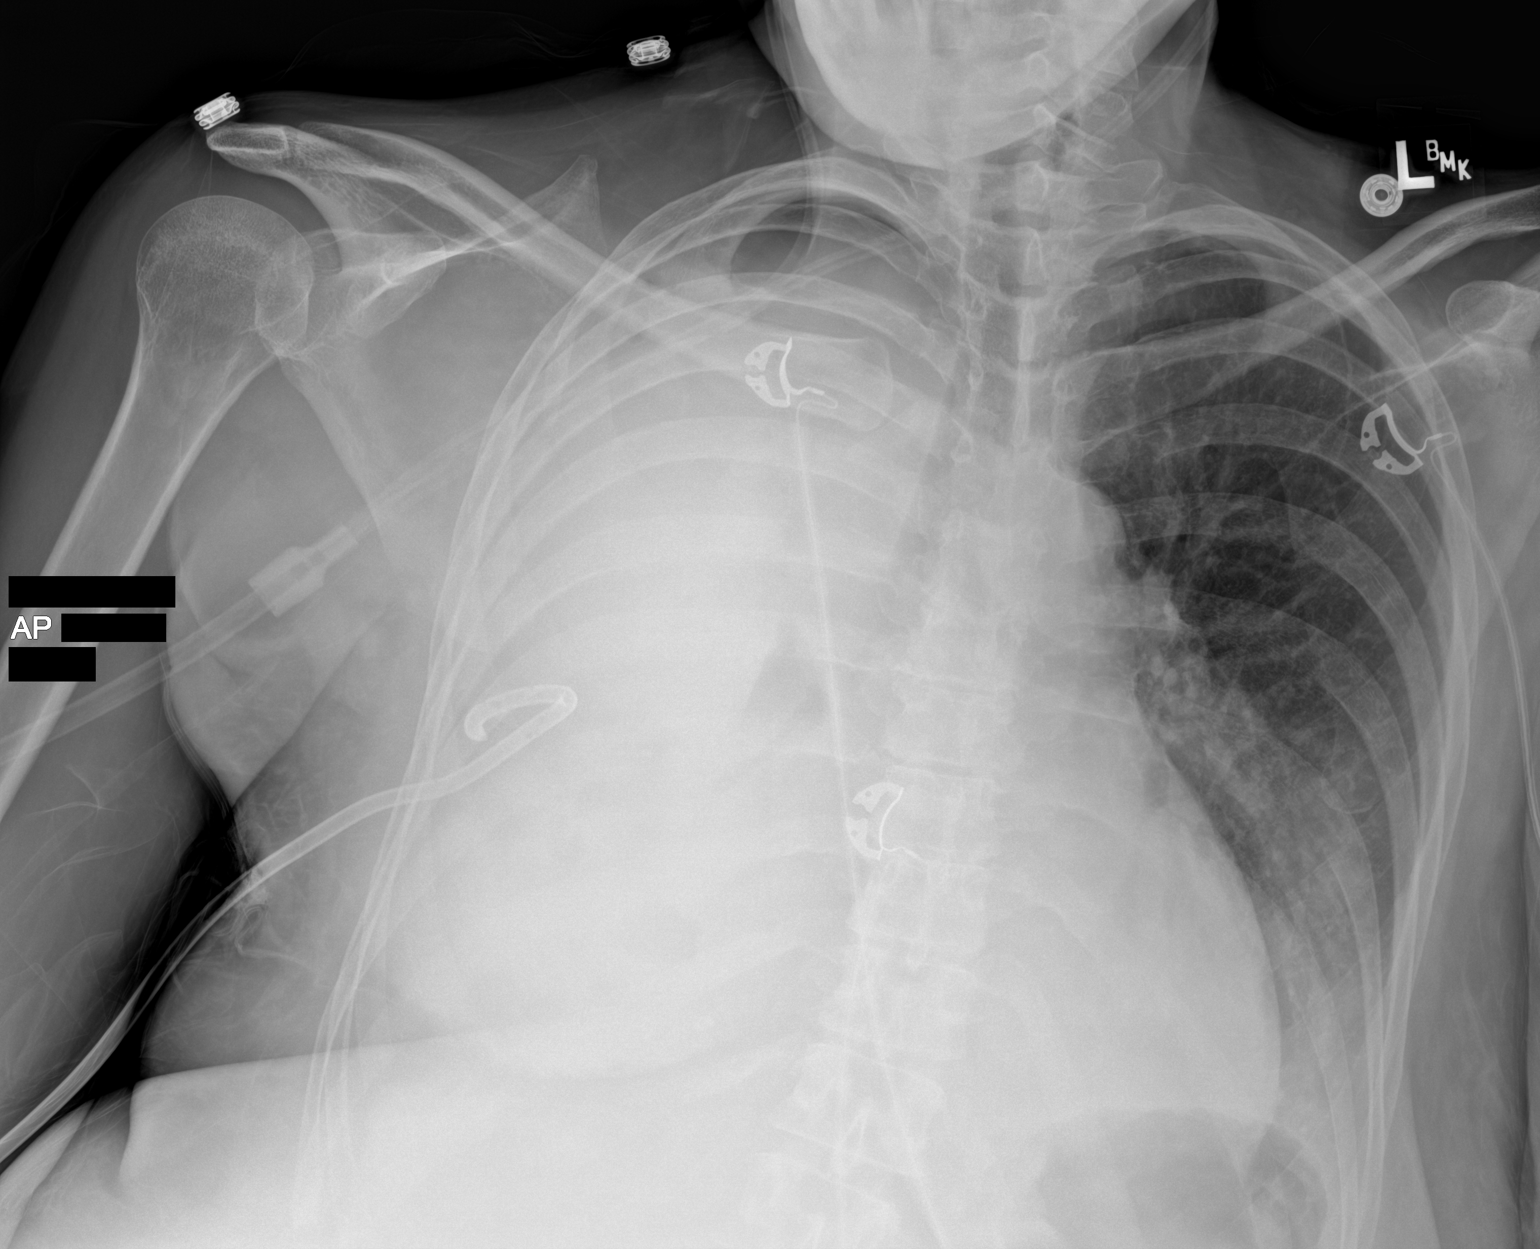

[1 of 1 positions shown; findings below may reference images not displayed]

FINDINGS: More dense and homogeneous right sided opacity. Right pleural
catheter in stable position. Left lower lobe opacity which also
appears increased. Likely no cardiomegaly when allowing for
mediastinal shift.
IMPRESSION: White out of the right chest with mediastinal mass effect from large
pleural effusion.

Retrocardiac infiltrate that could be infection or atelectasis.

## 2020-06-17 MED ORDER — TRAZODONE HCL 50 MG PO TABS
50.0000 mg | ORAL_TABLET | Freq: Every evening | ORAL | Status: DC | PRN
  Administered 2020-06-17 – 2020-06-24 (×5): 50 mg via ORAL
  Filled 2020-06-17 (×5): qty 1

## 2020-06-17 MED ORDER — BENZONATATE 100 MG PO CAPS
100.0000 mg | ORAL_CAPSULE | Freq: Two times a day (BID) | ORAL | Status: DC | PRN
  Administered 2020-06-17 (×2): 100 mg via ORAL
  Filled 2020-06-17 (×2): qty 1

## 2020-06-17 MED ORDER — TRAMADOL HCL 50 MG PO TABS
50.0000 mg | ORAL_TABLET | Freq: Four times a day (QID) | ORAL | Status: DC | PRN
  Administered 2020-06-19 (×2): 50 mg via ORAL
  Filled 2020-06-17 (×4): qty 1

## 2020-06-17 MED ORDER — SODIUM CHLORIDE 0.9 % IV SOLN
2.0000 g | Freq: Three times a day (TID) | INTRAVENOUS | Status: AC
  Administered 2020-06-17 – 2020-06-21 (×15): 2 g via INTRAVENOUS
  Filled 2020-06-17 (×16): qty 2

## 2020-06-17 NOTE — Progress Notes (Addendum)
Pharmacy Antibiotic Note  Jessica Willis is a 54 y.o. female admitted on 06/15/2020 with sepsis secondary to cellulitis.  Pharmacy has been consulted for vancomycin and cefepime dosing. Patient with breast mass x 6 months but does not appear sought medical attention, now two masses with necrosis and purulence.  CTA chest also reveals large pleural effusion.  Likely breast cancer.    Today, 06/17/2020 Day 2 antibiotics  Leukocytosis worse  Antibiotics broadened by adding cefepime  SCr trending up, monitor  Tmax last 24h 99.71F, last temp documented yesterday  Pleural fluid consistent with exudate  All cultures negative to date  Plan:  Continue cefepime 2 g IV q8h  Continue vancomycin at 1 gm IV q12h for goal trough 10-15 mcg/mL  Monitor renal function  Check trough if remains on vancomycin >4-5 days  Await pleural fluid and blood cultures  Height: 5\' 10"  (177.8 cm) Weight: 86.2 kg (190 lb) IBW/kg (Calculated) : 68.5  Temp (24hrs), Avg:99.5 F (37.5 C), Min:99.5 F (37.5 C), Max:99.5 F (37.5 C)  Recent Labs  Lab 06/15/20 2125 06/16/20 0034 06/16/20 0514 06/17/20 0453 06/17/20 1105  WBC 15.1* 13.0*  --  18.7*  --   CREATININE 0.61 0.56  --  0.75  --   LATICACIDVEN 2.8* 2.5* 2.3*  --  2.3*    Estimated Creatinine Clearance: 94.8 mL/min (by C-G formula based on SCr of 0.75 mg/dL).    No Known Allergies  Antimicrobials this admission: Ceftriaxone 11/3 x 1 Azithromycin 11/4 >> Vancomycin 11/4 >>  Microbiology results:  11/3 BCx: NGTD  11/3 UCx: NG  11/3 Pleural fluid: NGTD  11/4 MRSA PCR: Negative  Thank you for allowing pharmacy to be a part of this patient's care.  Benita Gutter 06/17/2020 4:00 PM

## 2020-06-17 NOTE — Progress Notes (Addendum)
Subjective:  CC: Jessica Willis is a 54 y.o. female  Hospital stay day 2,   right breast mass, right pleural effusion  HPI: Pt with no new complaints.  ROS:  General: Denies weight loss, weight gain, fatigue, fevers, chills, and night sweats. Heart: Denies chest pain, palpitations, racing heart, irregular heartbeat, leg pain or swelling, and decreased activity tolerance. Respiratory: Denies breathing difficulty, shortness of breath, wheezing, cough, and sputum. GI: Denies change in appetite, heartburn, nausea, vomiting, constipation, diarrhea, and blood in stool. GU: Denies difficulty urinating, pain with urinating, urgency, frequency, blood in urine.   Objective:   Temp:  [99.5 F (37.5 C)] 99.5 F (37.5 C) (11/04 1700) Pulse Rate:  [41-139] 123 (11/05 1200) Resp:  [9-37] 13 (11/05 1200) BP: (102-147)/(65-108) 123/84 (11/05 1200) SpO2:  [85 %-98 %] 85 % (11/05 1200)     Height: 5\' 10"  (177.8 cm) Weight: 86.2 kg BMI (Calculated): 27.26   Intake/Output this shift:   Intake/Output Summary (Last 24 hours) at 06/17/2020 1427 Last data filed at 06/17/2020 0600 Gross per 24 hour  Intake 2925.21 ml  Output 5650 ml  Net -2724.79 ml    Constitutional :  alert, cooperative, appears stated age and no distress  Respiratory:  clear to auscultation bilaterally. Chest tube with serosanguinous drainage now, 238ml output since yesterday.  Cardiovascular:  tachycardic  Gastrointestinal: soft, non-tender; bowel sounds normal; no masses,  no organomegaly.   Skin: Cool and moist. chpaerone present. Breast mass as before, unchanged  Psychiatric: Normal affect, non-agitated, not confused       LABS:  CMP Latest Ref Rng & Units 06/17/2020 06/16/2020 06/15/2020  Glucose 70 - 99 mg/dL 119(H) 124(H) 146(H)  BUN 6 - 20 mg/dL 17 20 22(H)  Creatinine 0.44 - 1.00 mg/dL 0.75 0.56 0.61  Sodium 135 - 145 mmol/L 135 140 143  Potassium 3.5 - 5.1 mmol/L 4.4 3.3(L) 3.3(L)  Chloride 98 - 111 mmol/L 102  100 101  CO2 22 - 32 mmol/L 19(L) 27 25  Calcium 8.9 - 10.3 mg/dL 9.0 9.1 9.5  Total Protein 6.5 - 8.1 g/dL - 8.8(H) 9.0(H)  Total Bilirubin 0.3 - 1.2 mg/dL - 0.8 1.0  Alkaline Phos 38 - 126 U/L - 102 107  AST 15 - 41 U/L - 38 43(H)  ALT 0 - 44 U/L - 27 29   CBC Latest Ref Rng & Units 06/17/2020 06/16/2020 06/16/2020  WBC 4.0 - 10.5 K/uL 18.7(H) - -  Hemoglobin 12.0 - 15.0 g/dL 14.7 14.6 14.0  Hematocrit 36 - 46 % 46.9(H) 46.0 44.3  Platelets 150 - 400 K/uL 438(H) - -    RADS: CLINICAL DATA:  Pleural effusion  EXAM: PORTABLE CHEST 1 VIEW  COMPARISON:  Yesterday  FINDINGS: More dense and homogeneous right sided opacity. Right pleural catheter in stable position. Left lower lobe opacity which also appears increased. Likely no cardiomegaly when allowing for mediastinal shift.  IMPRESSION: White out of the right chest with mediastinal mass effect from large pleural effusion.  Retrocardiac infiltrate that could be infection or atelectasis.   Electronically Signed   By: Monte Fantasia M.D.   On: 06/17/2020 05:00  Assessment:   Right breast mass- likely breast CA, pending cytology Right pleural effusion- likely malignant-  Catheter flushed at bedside and additional 280ml immediately noted. Continue at 20cm H2O suction until further notice.  Will add flush orders TID.    Pt otherwise with no complaints, VS with persistent tachycardia.  Recommend continue to monitor drainage, despite CXR from  earlier today. May need upsizing of drain, but not sure if that would be on any long term benefit due to degree of effusion noted and cause of it likely malignant

## 2020-06-17 NOTE — Progress Notes (Signed)
Ch met with Pt and Pt's daughter in response to OR for AD. AD education given. Pt wants to assign daughter Delana Meyer as [place]. Pt wants to complete AD later

## 2020-06-17 NOTE — Progress Notes (Signed)
PROGRESS NOTE    Jessica Willis  YYQ:[id number] DOB: 07-14-1966 DOA: 06/15/2020 PCP: System, Provider Not In     Brief Narrative:  Jessica Willis is a 54 year old female with past medical history significant for anemia who presents with chief complaint of shortness of breath and cough.  She states that she has had some shortness of breath, especially worsening in the past week.  She also admits to having a large right breast mass that started about 6 months ago.  She has not sought any medical care for this.  In the emergency department, imaging revealed two separate right-sided breast masses, right axillary adenopathy, enlarged left axillary lymph node, large right-sided pleural effusion with mass-effect and left mediastinal shift, near collapse of the right lung.  She underwent thoracentesis with pigtail catheter placement in the emergency department.  Repeat chest x-ray revealed persistent right-sided pleural effusion.  She was started on IV antibiotics.  General surgery was consulted and additional 3.7 L of serosanguineous fluid was removed from her pleural space.  Cardiothoracic surgery was consulted to comanage the chest tube.  Oncology as well as palliative care were consulted.  Due to continued tachycardia and worsening respiratory status, patient was transferred to stepdown unit with ICU consult.  New events last 24 hours / Subjective: Complaining of some burning pain in her right breast.  She desatted into the 80s and required increase in oxygenation, currently on 10 L this morning.  Additional 1.5 L output from chest tube overnight.  Assessment & Plan:   Principal Problem:   Sepsis ([place]) Active Problems:   Hypokalemia   Breast mass, right   Mastitis   Pleural effusion on right   Hemothorax on right   Large right pleural effusion, hemothorax  -CTA chest: Large right pleural effusion which appears to have mass effect with complete collapse/compression of the right lung  and shift of mediastinal structures to the left. -Status post thoracentesis with 500 cc serosanguineous fluid removed.  Pigtail catheter in place.  Hooked up to Pleur-evac with another 3.7 L removal 11/4. -Pleural fluid exudative, likely due to suspected malignancy. Pleural fluid culture pending. Cytology pending  -Repeat chest x-ray post thoracentesis: placement of right pigtail catheter with persistent complete opacification of the right hemithorax. There may be slight diminished mediastinal shift to the left. No visualized pneumothorax. -General surgery and cardiothoracic surgery comanaging chest tube  Right breast mass -CTA chest: One, possibly 2 separate large masses within the right breast extending to the skin surface with associated adjacent skin thickening and right axillary adenopathy. Findings compatible with right breast cancer. Single enlarged left axillary lymph node. -General surgery, oncology, palliative care medicine following  Acute hypoxemic respiratory failure -Secondary to pleural effusion as above -Continue nasal cannula O2 to maintain sat greater than 92%.  Remains on 10 L nasal cannula O2 at present -Critical care following  Sepsis secondary to right breast cellulitis, HCAP -Sepsis present on admission with tachycardia, tachypnea, leukocytosis -Concern for cellulitis overlying breast mass with purulent discharge -Continue IV vanco, cefepime      DVT prophylaxis: SCD   Code Status: Full code Family Communication: No family at bedside Disposition Plan:  Status is: Inpatient  Remains inpatient appropriate because:Hemodynamically unstable, Ongoing diagnostic testing needed not appropriate for outpatient work up, IV treatments appropriate due to intensity of illness or inability to take PO and Inpatient level of care appropriate due to severity of illness   Dispo: The patient is from: Home  Anticipated d/c is to: Home              Anticipated d/c  date is: > 3 days              Patient currently is not medically stable to d/c.  Remains ill with large right pleural effusion, respiratory failure.  Remains on IV antibiotics.    Consultants:   General surgery  Palliative care medicine  Oncology  Cardiothoracic surgery  Critical care medicine  Procedures:   Thoracentesis in the ER 11/3  Antimicrobials:  Anti-infectives (From admission, onward)   Start     Dose/Rate Route Frequency Ordered Stop   06/17/20 0815  ceFEPIme (MAXIPIME) 2 g in sodium chloride 0.9 % 100 mL IVPB        2 g 200 mL/hr over 30 Minutes Intravenous Every 8 hours 06/17/20 0718     06/16/20 2200  vancomycin (VANCOCIN) IVPB 1000 mg/200 mL premix  Status:  Discontinued        1,000 mg 200 mL/hr over 60 Minutes Intravenous Every 12 hours 06/16/20 1005 06/16/20 1108   06/16/20 2200  vancomycin (VANCOCIN) IVPB 1000 mg/200 mL premix        1,000 mg 200 mL/hr over 60 Minutes Intravenous Every 12 hours 06/16/20 1123     06/16/20 0900  vancomycin (VANCOCIN) IVPB 1000 mg/200 mL premix  Status:  Discontinued        1,000 mg 200 mL/hr over 60 Minutes Intravenous Every 8 hours 06/16/20 0102 06/16/20 1005   06/16/20 0045  vancomycin (VANCOREADY) IVPB 2000 mg/400 mL        2,000 mg 200 mL/hr over 120 Minutes Intravenous  Once 06/16/20 0031 06/16/20 0307   06/16/20 0015  vancomycin (VANCOCIN) IVPB 1000 mg/200 mL premix  Status:  Discontinued        1,000 mg 200 mL/hr over 60 Minutes Intravenous  Once 06/16/20 0010 06/16/20 0031   06/16/20 0009  cefTRIAXone (ROCEPHIN) 2 g in sodium chloride 0.9 % 100 mL IVPB  Status:  Discontinued        2 g 200 mL/hr over 30 Minutes Intravenous Every 24 hours 06/16/20 0010 06/16/20 0034   06/15/20 2215  cefTRIAXone (ROCEPHIN) 2 g in sodium chloride 0.9 % 100 mL IVPB  Status:  Discontinued        2 g 200 mL/hr over 30 Minutes Intravenous Every 24 hours 06/15/20 2200 06/16/20 1108   06/15/20 2215  azithromycin (ZITHROMAX) 500 mg in  sodium chloride 0.9 % 250 mL IVPB  Status:  Discontinued        500 mg 250 mL/hr over 60 Minutes Intravenous Every 24 hours 06/15/20 2200 06/16/20 1108       Objective: Vitals:   06/17/20 0700 06/17/20 0800 06/17/20 0900 06/17/20 1000  BP: 103/77 108/76 120/85 102/71  Pulse: (!) 117 (!) 115 (!) 122 (!) 122  Resp: (!) 27 18 (!) 35 (!) 9  Temp:      TempSrc:      SpO2: (!) 89% 91% (!) 88% (!) 87%  Weight:      Height:        Intake/Output Summary (Last 24 hours) at 06/17/2020 1042 Last data filed at 06/17/2020 0600 Gross per 24 hour  Intake 2925.21 ml  Output 5650 ml  Net -2724.79 ml   Filed Weights   06/15/20 2114  Weight: 86.2 kg    Examination: General exam: Appears calm and comfortable  Respiratory system: Diminished breath sounds right base, chest tube  present.  On 10 L nasal cannula O2 with mild conversational dyspnea Cardiovascular system: S1 & S2 heard, tachycardic, regular rhythm. No pedal edema. Gastrointestinal system: Abdomen is nondistended, soft and nontender. Normal bowel sounds heard. Central nervous system: Alert and oriented. Non focal exam. Speech clear  Extremities: Symmetric in appearance bilaterally  Psychiatry: Judgement and insight appear stable.   Data Reviewed: I have personally reviewed following labs and imaging studies  CBC: Recent Labs  Lab 06/15/20 2125 06/16/20 0034 06/16/20 1537 06/16/20 1636 06/17/20 0453  WBC 15.1* 13.0*  --   --  18.7*  NEUTROABS 12.3*  --   --   --   --   HGB 11.5* 10.3* 14.0 14.6 14.7  HCT 36.3 33.5* 44.3 46.0 46.9*  MCV 82.1 83.1  --   --  81.4  PLT 568* 465*  --   --  222*   Basic Metabolic Panel: Recent Labs  Lab 06/15/20 2125 06/16/20 0034 06/17/20 0453  NA 143 140 135  K 3.3* 3.3* 4.4  CL 101 100 102  CO2 25 27 19*  GLUCOSE 146* 124* 119*  BUN 22* 20 17  CREATININE 0.61 0.56 0.75  CALCIUM 9.5 9.1 9.0  MG 2.2  --  2.0   GFR: Estimated Creatinine Clearance: 94.8 mL/min (by C-G formula  based on SCr of 0.75 mg/dL). Liver Function Tests: Recent Labs  Lab 06/15/20 2125 06/16/20 0034  AST 43* 38  ALT 29 27  ALKPHOS 107 102  BILITOT 1.0 0.8  PROT 9.0* 8.8*  ALBUMIN 4.0 3.9  3.9   No results for input(s): LIPASE, AMYLASE in the last 168 hours. No results for input(s): AMMONIA in the last 168 hours. Coagulation Profile: Recent Labs  Lab 06/16/20 0034  INR 1.2   Cardiac Enzymes: No results for input(s): CKTOTAL, CKMB, CKMBINDEX, TROPONINI in the last 168 hours. BNP (last 3 results) No results for input(s): PROBNP in the last 8760 hours. HbA1C: No results for input(s): HGBA1C in the last 72 hours. CBG: No results for input(s): GLUCAP in the last 168 hours. Lipid Profile: No results for input(s): CHOL, HDL, LDLCALC, TRIG, CHOLHDL, LDLDIRECT in the last 72 hours. Thyroid Function Tests: No results for input(s): TSH, T4TOTAL, FREET4, T3FREE, THYROIDAB in the last 72 hours. Anemia Panel: No results for input(s): VITAMINB12, FOLATE, FERRITIN, TIBC, IRON, RETICCTPCT in the last 72 hours. Sepsis Labs: Recent Labs  Lab 06/15/20 2125 06/16/20 0034 06/16/20 0514  PROCALCITON 24.07 21.52  --   LATICACIDVEN 2.8* 2.5* 2.3*    Recent Results (from the past 240 hour(s))  Respiratory Panel by RT PCR (Flu A&B, Covid) - Nasopharyngeal Swab     Status: None   Collection Time: 06/15/20  9:25 PM   Specimen: Nasopharyngeal Swab  Result Value Ref Range Status   SARS Coronavirus 2 by RT PCR NEGATIVE NEGATIVE Final    Comment: (NOTE) SARS-CoV-2 target nucleic acids are NOT DETECTED.  The SARS-CoV-2 RNA is generally detectable in upper respiratoy specimens during the acute phase of infection. The lowest concentration of SARS-CoV-2 viral copies this assay can detect is 131 copies/mL. A negative result does not preclude SARS-Cov-2 infection and should not be used as the sole basis for treatment or other patient management decisions. A negative result may occur with   improper specimen collection/handling, submission of specimen other than nasopharyngeal swab, presence of viral mutation(s) within the areas targeted by this assay, and inadequate number of viral copies (<131 copies/mL). A negative result must be combined with clinical  observations, patient history, and epidemiological information. The expected result is Negative.  Fact Sheet for Patients:  PinkCheek.be  Fact Sheet for Healthcare Providers:  GravelBags.it  This test is no t yet approved or cleared by the [place] FDA and  has been authorized for detection and/or diagnosis of SARS-CoV-2 by FDA under an Emergency Use Authorization (EUA). This EUA will remain  in effect (meaning this test can be used) for the duration of the COVID-19 declaration under Section 564(b)(1) of the Act, 21 U.S.C. section 360bbb-3(b)(1), unless the authorization is terminated or revoked sooner.     Influenza A by PCR NEGATIVE NEGATIVE Final   Influenza B by PCR NEGATIVE NEGATIVE Final    Comment: (NOTE) The Xpert Xpress SARS-CoV-2/FLU/RSV assay is intended as an aid in  the diagnosis of influenza from Nasopharyngeal swab specimens and  should not be used as a sole basis for treatment. Nasal washings and  aspirates are unacceptable for Xpert Xpress SARS-CoV-2/FLU/RSV  testing.  Fact Sheet for Patients: PinkCheek.be  Fact Sheet for Healthcare Providers: GravelBags.it  This test is not yet approved or cleared by the [place] FDA and  has been authorized for detection and/or diagnosis of SARS-CoV-2 by  FDA under an Emergency Use Authorization (EUA). This EUA will remain  in effect (meaning this test can be used) for the duration of the  Covid-19 declaration under Section 564(b)(1) of the Act, 21  U.S.C. section 360bbb-3(b)(1), unless the authorization is  terminated or  revoked. Performed at [facility], [address] [place], [place] [address]   Blood Culture (routine x 2)     Status: None (Preliminary result)   Collection Time: 06/15/20 10:09 PM   Specimen: BLOOD  Result Value Ref Range Status   Specimen Description BLOOD LEFT AC  Final   Special Requests   Final    BOTTLES DRAWN AEROBIC AND ANAEROBIC Blood Culture results may not be optimal due to an inadequate volume of blood received in culture bottles   Culture   Final    NO GROWTH 2 DAYS Performed at [facility], [address], [place], [place] [address]    Report Status PENDING  Incomplete  Blood Culture (routine x 2)     Status: None (Preliminary result)   Collection Time: 06/15/20 10:11 PM   Specimen: BLOOD  Result Value Ref Range Status   Specimen Description BLOOD RIGHT [facility]  Final   Special Requests   Final    BOTTLES DRAWN AEROBIC AND ANAEROBIC Blood Culture adequate volume   Culture   Final    NO GROWTH 2 DAYS Performed at [facility], [address], [place], [place] [address]    Report Status PENDING  Incomplete  Urine culture     Status: None   Collection Time: 06/15/20 11:40 PM   Specimen: Urine, Random  Result Value Ref Range Status   Specimen Description   Final    URINE, RANDOM Performed at [facility], [address], [place], [place] [address]    Special Requests   Final    NONE Performed at [facility], [address] [place], [place] [address]    Culture   Final    NO GROWTH Performed at [facility], [place] [address], [place], [place] [address]    Report Status 06/17/2020 FINAL  Final  Pleural Fluid culture (includes gram stain)     Status: None (Preliminary result)   Collection Time: 06/15/20 11:40 PM  Specimen: Pleural Fluid  Result Value Ref Range Status   Specimen Description   Final    PLEURAL Performed at [facility], [address], [place], [place] [address]    Special  Requests   Final    NONE Performed at [facility], [place], [place] [address]    Gram Stain NO WBC SEEN NO ORGANISMS SEEN   Final   Culture   Final    NO GROWTH 1 DAY Performed at [facility], [place] [address], [place], [place] [address]    Report Status PENDING  Incomplete  MRSA PCR Screening     Status: None   Collection Time: 06/16/20 11:51 PM   Specimen: Nasopharyngeal  Result Value Ref Range Status   MRSA by PCR NEGATIVE NEGATIVE Final    Comment:        The GeneXpert MRSA Assay (FDA approved for NASAL specimens only), is one component of a comprehensive MRSA colonization surveillance program. It is not intended to diagnose MRSA infection nor to guide or monitor treatment for MRSA infections. Performed at [facility], [address] [place], [place] [address]       Radiology Studies: DG Chest 1 View  Result Date: 06/16/2020 CLINICAL DATA:  Hemothorax on the right. EXAM: CHEST  1 VIEW COMPARISON:  Same day chest radiograph. FINDINGS: A right-sided pleural pigtail catheter is redemonstrated. There is interval improvement in aeration of the right lung with decreased size of a right hemothorax. A small right pneumothorax is not significantly changed in size. Severe right atelectasis/airspace disease is redemonstrated. Mild interstitial opacities in the left lung appear unchanged. There is no left pleural effusion or left pneumothorax. The cardiac silhouette is partially obscured but appears unchanged. IMPRESSION: 1. Interval improvement in aeration of the right lung with decreased size of a right hemothorax. 2. Unchanged small right pneumothorax with pleural pigtail catheter in place. 3. Severe right atelectasis/airspace disease. Electronically Signed   By: Zerita Boers M.D.   On: 06/16/2020 14:52   DG Chest 1 View  Result Date: 06/16/2020 CLINICAL DATA:  Cough and phlegm production. EXAM: CHEST  1 VIEW COMPARISON:  Chest radiograph  dated 06/15/2020. FINDINGS: The cardiac silhouette is obscured. A right-sided pleural pigtail catheter is in unchanged location and there has been interval decrease in a large right pleural fluid collection (effusion versus hemothorax). There is no longer mediastinal shift to the left. There is a small right pneumothorax. Severe airspace and interstitial opacities are seen on the right. Mild interstitial opacities are seen on the left. There is no left pleural effusion or pneumothorax. The osseous structures are intact. IMPRESSION: 1. Small right pneumothorax with a right-sided pleural pigtail catheter in place. 2. Interval decrease in a large right pleural fluid collection (effusion versus hemothorax). 3. Severe airspace and interstitial opacities on the right. Mild interstitial opacities in the left. Electronically Signed   By: Zerita Boers M.D.   On: 06/16/2020 14:28   DG Chest 1 View  Result Date: 06/15/2020 CLINICAL DATA:  Short of breath, cough EXAM: CHEST  1 VIEW COMPARISON:  06/29/2015 FINDINGS: 2 frontal views of the chest demonstrate complete opacification of the right hemithorax, with slight mediastinal shift to the left, consistent with large right pleural effusion and underlying lung consolidation. Chronic areas of scarring are seen within the left chest. No pneumothorax. Cardiac silhouette is unremarkable. No acute bony abnormalities. IMPRESSION: 1. Opacification of the right hemithorax consistent with large right pleural effusion and  underlying right lung consolidation. Electronically Signed   By: Randa Ngo M.D.   On: 06/15/2020 21:42   CT Angio Chest PE W and/or Wo Contrast  Result Date: 06/15/2020 CLINICAL DATA:  Cough, shortness of breath EXAM: CT ANGIOGRAPHY CHEST WITH CONTRAST TECHNIQUE: Multidetector CT imaging of the chest was performed using the standard protocol during bolus administration of intravenous contrast. Multiplanar CT image reconstructions and MIPs were obtained to  evaluate the vascular anatomy. CONTRAST:  100 mL Omnipaque 350 IV COMPARISON:  06/30/2015 FINDINGS: Cardiovascular: No filling defects in the pulmonary arteries to suggest pulmonary emboli. Heart is normal size. Aorta normal caliber. Mediastinum/Nodes: No visible mediastinal or hilar adenopathy. Bilateral axillary adenopathy, right greater than left. Index right axillary lymph node has a short axis diameter of 13 mm. Left axillary lymph node has a short axis diameter of 11 mm. Lungs/Pleura: Large right pleural effusion completely compressed seen in the right lung which is not aerated. This appears to have mass effect with shift of the right lung and mediastinal structures to the left. Areas of atelectasis or scarring in the left lung base. No effusion on the left. Upper Abdomen: Imaging into the upper abdomen demonstrates no acute findings. Musculoskeletal: Large mass noted in the right breast measuring 8 x 6 cm. Possible separate right breast mass more inferiorly in the right breast measuring 8 x 4 cm. Skin thickening or edema noted. Associated right axillary adenopathy. A few ill-defined sclerotic areas within the thoracic spine could reflect metastases. Review of the MIP images confirms the above findings. IMPRESSION: No evidence of pulmonary embolus. One, possibly 2 separate large masses within the right breast extending to the skin surface with associated adjacent skin thickening and right axillary adenopathy. Findings compatible with right breast cancer. Large right pleural effusion which appears to have mass effect with complete collapse/compression of the right lung and shift of mediastinal structures to the left. Single enlarged left axillary lymph node. These results were called by telephone at the time of interpretation on 06/15/2020 at 10:43 pm to provider [facility] , who verbally acknowledged these results. Electronically Signed   By: Rolm Baptise M.D.   On: 06/15/2020 22:46   US Venous Img Lower  Unilateral Left (DVT)  Result Date: 06/16/2020 CLINICAL DATA:  Initial evaluation for acute left lower extremity edema. EXAM: LEFT LOWER EXTREMITY VENOUS DOPPLER ULTRASOUND TECHNIQUE: Gray-scale sonography with graded compression, as well as color Doppler and duplex ultrasound were performed to evaluate the lower extremity deep venous systems from the level of the common femoral vein and including the common femoral, femoral, profunda femoral, popliteal and calf veins including the posterior tibial, peroneal and gastrocnemius veins when visible. The superficial great saphenous vein was also interrogated. Spectral Doppler was utilized to evaluate flow at rest and with distal augmentation maneuvers in the common femoral, femoral and popliteal veins. COMPARISON:  None. FINDINGS: Contralateral Common Femoral Vein: Respiratory phasicity is normal and symmetric with the symptomatic side. No evidence of thrombus. Normal compressibility. Common Femoral Vein: No evidence of thrombus. Normal compressibility, respiratory phasicity and response to augmentation. Saphenofemoral Junction: No evidence of thrombus. Normal compressibility and flow on color Doppler imaging. Profunda Femoral Vein: No evidence of thrombus. Normal compressibility and flow on color Doppler imaging. Femoral Vein: No evidence of thrombus. Normal compressibility, respiratory phasicity and response to augmentation. Popliteal Vein: No evidence of thrombus. Normal compressibility, respiratory phasicity and response to augmentation. Calf Veins: No evidence of thrombus. Normal compressibility and flow on color Doppler imaging. Superficial  Great Saphenous Vein: No evidence of thrombus. Normal compressibility. Venous Reflux:  None. Other Findings:  None. IMPRESSION: No evidence of deep venous thrombosis. Electronically Signed   By: Jeannine Boga M.D.   On: 06/16/2020 01:30   DG Chest Port 1 View  Result Date: 06/17/2020 CLINICAL DATA:  Pleural effusion  EXAM: PORTABLE CHEST 1 VIEW COMPARISON:  Yesterday FINDINGS: More dense and homogeneous right sided opacity. Right pleural catheter in stable position. Left lower lobe opacity which also appears increased. Likely no cardiomegaly when allowing for mediastinal shift. IMPRESSION: White out of the right chest with mediastinal mass effect from large pleural effusion. Retrocardiac infiltrate that could be infection or atelectasis. Electronically Signed   By: Monte Fantasia M.D.   On: 06/17/2020 05:00   DG Chest Portable 1 View  Result Date: 06/15/2020 CLINICAL DATA:  Right chest tube placement. EXAM: PORTABLE CHEST 1 VIEW COMPARISON:  Radiograph and CT earlier today. FINDINGS: Placement of right pigtail catheter. There is persistent complete opacification of the right hemithorax. There may be slight diminished mediastinal shift to the left. No visualized pneumothorax. No focal abnormality in the left lung. IMPRESSION: Placement of right pigtail catheter with persistent complete opacification of the right hemithorax. There may be slight diminished mediastinal shift to the left. No visualized pneumothorax. Electronically Signed   By: Keith Rake M.D.   On: 06/15/2020 23:40      Scheduled Meds: . Chlorhexidine Gluconate Cloth  6 each Topical Q0600  . melatonin  5 mg Oral QHS   Continuous Infusions: . ceFEPime (MAXIPIME) IV 2 g (06/17/20 0900)  . lactated ringers 125 mL/hr at 06/17/20 0850  . vancomycin Stopped (06/16/20 2249)     LOS: 2 days      Time spent: 40 minutes   Dessa Phi, DO [facility] 06/17/2020, 10:42 AM   Available via Epic secure chat 7am-7pm After these hours, please refer to coverage provider listed on [email or web address]

## 2020-06-17 NOTE — Progress Notes (Addendum)
Called bedside, patient SpO2 78% & tachycardic sustaining in the 130's on HFNC at 12 L.  Patient able to answer questions in complete sentences, but sitting forward with increased WOB. Auscultated clear but diminished breath sounds bilaterally RML & RLL very diminished. CT was set to intermittent wall sxn, changed to continuous sxn  P: - STAT CXR ordered to assess R pleural effusion - CT flushed by care RN, 10 mL serosanguineous returned, no clots noted and no resistance flushing - Bipap initiated to maintain SpO2 >90%  Will monitor patient closely   Domingo Pulse [place], AGACNP-BC Acute Care Nurse Practitioner [place]   [phone] / [phone] Please see Amion for pager details.

## 2020-06-17 NOTE — Progress Notes (Signed)
   06/17/20 1300  Clinical Encounter Type  Visited With Patient  Visit Type Initial  Referral From Nurse  Consult/Referral To Chaplain  Chaplain responded to called received from ICU, saying Pt could benefit from a visit. When Chaplain arrived at the room, Pt was on the telephone and asked chaplain to give  Her 10 minutes. While waiting at the nurses' station the nurse thanked chaplain for coming and explained briefly what is going on with Pt. When chaplain entered the room she complimented Pt on her eyebrows and lashes, that brought a smile to Pt's face. Chaplain asked how she could help her and Pt said the doctor said it might be good for her to talk with a chaplain. Chaplain explained, chaplains are here to support you in any way possible. Pt briefly explained what was going on in her body. Chaplain and Pt also briefly talked about her children and grandchildren. Chaplain with check in on Pt later.

## 2020-06-17 NOTE — Progress Notes (Signed)
Pharmacy Antibiotic Note  Jessica Willis is a 54 y.o. female admitted on 06/15/2020 with pneumonia.  Pharmacy has been consulted for Cefepime dosing.  Plan: Cefepime 2 gm IV Q8H ordered to start on 11/5 @ 0800.  Height: 5\' 10"  (177.8 cm) Weight: 86.2 kg (190 lb) IBW/kg (Calculated) : 68.5  Temp (24hrs), Avg:98.6 F (37 C), Min:97.9 F (36.6 C), Max:99.5 F (37.5 C)  Recent Labs  Lab 06/15/20 2125 06/16/20 0034 06/16/20 0514 06/17/20 0453  WBC 15.1* 13.0*  --  18.7*  CREATININE 0.61 0.56  --  0.75  LATICACIDVEN 2.8* 2.5* 2.3*  --     Estimated Creatinine Clearance: 94.8 mL/min (by C-G formula based on SCr of 0.75 mg/dL).    No Known Allergies  Antimicrobials this admission:  >>    >>   Dose adjustments this admission:   Microbiology results:  BCx:   UCx:    Sputum:    MRSA PCR:   Thank you for allowing pharmacy to be a part of this patient's care.  Jaclynne Baldo D 06/17/2020 7:18 AM

## 2020-06-17 NOTE — Consult Note (Signed)
Jessica Willis  [phone] [phone]   Name: Jessica Willis Date: 06/17/2020 MRN: [id number]  DOB: 1966-02-12  Patient Care Team: System, Provider Not In as PCP - General    REASON FOR CONSULTATION: Jessica Willis is a 55 y.o. female with multiple medical problems including history of anemia who was admitted to the hospital on 06/15/2020 with sepsis and acute hypoxic respiratory failure.  She was found to have a fungating right breast mass with purulent drainage concerning for source of infection.  CTA of the chest revealed near complete collapse of the right lung with extensive right-sided pleural effusion and mass-effect.  Patient underwent chest tube insertion with several liters of serosanguineous fluid draining from her pleural space.  Palliative care was consulted help address goals  SOCIAL HISTORY:     reports that she has never smoked. She has never used smokeless tobacco. She reports previous alcohol use of about 1.0 standard drink of alcohol per week. She reports that she does not use drugs.  Patient is unmarried and lives at home with her boyfriend.  She has 2 sons and a daughter who are involved in her care.  Patient worked at an [employer].  ADVANCE DIRECTIVES:  Does not have  CODE STATUS: Full code  PAST MEDICAL HISTORY: Past Medical History:  Diagnosis Date  . Anemia   . Patient denies medical problems     PAST SURGICAL HISTORY:  Past Surgical History:  Procedure Laterality Date  . TUBAL LIGATION      HEMATOLOGY/ONCOLOGY HISTORY:  Oncology History   No history exists.    ALLERGIES:  has No Known Allergies.  MEDICATIONS:  Current Facility-Administered Medications  Medication Dose Route Frequency Provider Last Rate Last Admin  . acetaminophen (TYLENOL) tablet 650 mg  650 mg Oral Q6H PRN Elwyn Reach, MD   650 mg at 06/16/20 1456   Or  . acetaminophen (TYLENOL) suppository  650 mg  650 mg Rectal Q6H PRN Elwyn Reach, MD      . benzonatate (TESSALON) capsule 100 mg  100 mg Oral BID PRN Darel Hong D, NP   100 mg at 06/17/20 0258  . ceFEPIme (MAXIPIME) 2 g in sodium chloride 0.9 % 100 mL IVPB  2 g Intravenous Q8H Choi, Anderson Malta, DO 200 mL/hr at 06/17/20 0900 2 g at 06/17/20 0900  . Chlorhexidine Gluconate Cloth 2 % PADS 6 each  6 each Topical Q0600 Bradly Bienenstock, NP   6 each at 06/17/20 0015  . guaiFENesin-codeine 100-10 MG/5ML solution 10 mL  10 mL Oral Q4H PRN Sharion Settler, NP   10 mL at 06/17/20 0229  . lactated ringers infusion   Intravenous Continuous Elwyn Reach, MD 125 mL/hr at 06/17/20 0850 New Bag at 06/17/20 0850  . melatonin tablet 5 mg  5 mg Oral QHS Sharion Settler, NP   5 mg at 06/16/20 2241  . metoprolol tartrate (LOPRESSOR) injection 5 mg  5 mg Intravenous Q6H PRN Sharion Settler, NP   5 mg at 06/16/20 2242  . morphine 2 MG/ML injection 1-2 mg  1-2 mg Intravenous Q4H PRN Darel Hong D, NP   2 mg at 06/16/20 2015  . ondansetron (ZOFRAN) tablet 4 mg  4 mg Oral Q6H PRN Elwyn Reach, MD       Or  . ondansetron (ZOFRAN) injection 4 mg  4 mg Intravenous Q6H PRN Elwyn Reach, MD      . traMADol Veatrice Bourbon) tablet  50 mg  50 mg Oral Q6H PRN Dessa Phi, DO      . traZODone (DESYREL) tablet 50 mg  50 mg Oral QHS PRN Bradly Bienenstock, NP   50 mg at 06/17/20 0259  . vancomycin (VANCOCIN) IVPB 1000 mg/200 mL premix  1,000 mg Intravenous Q12H Berton Mount, RPH 200 mL/hr at 06/17/20 1042 1,000 mg at 06/17/20 1042    VITAL SIGNS: BP 123/84   Pulse (!) 123   Temp 99.5 F (37.5 C) (Axillary)   Resp 13   Ht _0  (1.778 m)   Wt 190 lb (86.2 kg)   LMP 06/16/2015 Comment: pt has had tubes tied   SpO2 (!) 85%   BMI 27.26 kg/m  Filed Weights   06/15/20 2114  Weight: 190 lb (86.2 kg)    Estimated body mass index is 27.26 kg/m as calculated from the following:   Height as of this encounter: _1  (1.778 m).    Weight as of this encounter: 190 lb (86.2 kg).  LABS: CBC:    Component Value Date/Time   WBC 18.7 (H) 06/17/2020 0453   HGB 14.7 06/17/2020 0453   HCT 46.9 (H) 06/17/2020 0453   PLT 438 (H) 06/17/2020 0453   MCV 81.4 06/17/2020 0453   NEUTROABS 12.3 (H) 06/15/2020 2125   LYMPHSABS 1.5 06/15/2020 2125   MONOABS 1.1 (H) 06/15/2020 2125   EOSABS 0.0 06/15/2020 2125   BASOSABS 0.1 06/15/2020 2125   Comprehensive Metabolic Panel:    Component Value Date/Time   NA 135 06/17/2020 0453   K 4.4 06/17/2020 0453   CL 102 06/17/2020 0453   CO2 19 (L) 06/17/2020 0453   BUN 17 06/17/2020 0453   CREATININE 0.75 06/17/2020 0453   GLUCOSE 119 (H) 06/17/2020 0453   CALCIUM 9.0 06/17/2020 0453   AST 38 06/16/2020 0034   ALT 27 06/16/2020 0034   ALKPHOS 102 06/16/2020 0034   BILITOT 0.8 06/16/2020 0034   PROT 8.8 (H) 06/16/2020 0034   ALBUMIN 3.9 06/16/2020 0034   ALBUMIN 3.9 06/16/2020 0034    RADIOGRAPHIC STUDIES: DG Chest 1 View  Result Date: 06/16/2020 CLINICAL DATA:  Hemothorax on the right. EXAM: CHEST  1 VIEW COMPARISON:  Same day chest radiograph. FINDINGS: A right-sided pleural pigtail catheter is redemonstrated. There is interval improvement in aeration of the right lung with decreased size of a right hemothorax. A small right pneumothorax is not significantly changed in size. Severe right atelectasis/airspace disease is redemonstrated. Mild interstitial opacities in the left lung appear unchanged. There is no left pleural effusion or left pneumothorax. The cardiac silhouette is partially obscured but appears unchanged. IMPRESSION: 1. Interval improvement in aeration of the right lung with decreased size of a right hemothorax. 2. Unchanged small right pneumothorax with pleural pigtail catheter in place. 3. Severe right atelectasis/airspace disease. Electronically Signed   By: Zerita Boers M.D.   On: 06/16/2020 14:52   DG Chest 1 View  Result Date: 06/16/2020 CLINICAL DATA:  Cough  and phlegm production. EXAM: CHEST  1 VIEW COMPARISON:  Chest radiograph dated 06/15/2020. FINDINGS: The cardiac silhouette is obscured. A right-sided pleural pigtail catheter is in unchanged location and there has been interval decrease in a large right pleural fluid collection (effusion versus hemothorax). There is no longer mediastinal shift to the left. There is a small right pneumothorax. Severe airspace and interstitial opacities are seen on the right. Mild interstitial opacities are seen on the left. There is no left pleural effusion or  pneumothorax. The osseous structures are intact. IMPRESSION: 1. Small right pneumothorax with a right-sided pleural pigtail catheter in place. 2. Interval decrease in a large right pleural fluid collection (effusion versus hemothorax). 3. Severe airspace and interstitial opacities on the right. Mild interstitial opacities in the left. Electronically Signed   By: Zerita Boers M.D.   On: 06/16/2020 14:28   DG Chest 1 View  Result Date: 06/15/2020 CLINICAL DATA:  Short of breath, cough EXAM: CHEST  1 VIEW COMPARISON:  06/29/2015 FINDINGS: 2 frontal views of the chest demonstrate complete opacification of the right hemithorax, with slight mediastinal shift to the left, consistent with large right pleural effusion and underlying lung consolidation. Chronic areas of scarring are seen within the left chest. No pneumothorax. Cardiac silhouette is unremarkable. No acute bony abnormalities. IMPRESSION: 1. Opacification of the right hemithorax consistent with large right pleural effusion and underlying right lung consolidation. Electronically Signed   By: Randa Ngo M.D.   On: 06/15/2020 21:42   CT Angio Chest PE W and/or Wo Contrast  Result Date: 06/15/2020 CLINICAL DATA:  Cough, shortness of breath EXAM: CT ANGIOGRAPHY CHEST WITH CONTRAST TECHNIQUE: Multidetector CT imaging of the chest was performed using the standard protocol during bolus administration of intravenous  contrast. Multiplanar CT image reconstructions and MIPs were obtained to evaluate the vascular anatomy. CONTRAST:  100 mL Omnipaque 350 IV COMPARISON:  06/30/2015 FINDINGS: Cardiovascular: No filling defects in the pulmonary arteries to suggest pulmonary emboli. Heart is normal size. Aorta normal caliber. Mediastinum/Nodes: No visible mediastinal or hilar adenopathy. Bilateral axillary adenopathy, right greater than left. Index right axillary lymph node has a short axis diameter of 13 mm. Left axillary lymph node has a short axis diameter of 11 mm. Lungs/Pleura: Large right pleural effusion completely compressed seen in the right lung which is not aerated. This appears to have mass effect with shift of the right lung and mediastinal structures to the left. Areas of atelectasis or scarring in the left lung base. No effusion on the left. Upper Abdomen: Imaging into the upper abdomen demonstrates no acute findings. Musculoskeletal: Large mass noted in the right breast measuring 8 x 6 cm. Possible separate right breast mass more inferiorly in the right breast measuring 8 x 4 cm. Skin thickening or edema noted. Associated right axillary adenopathy. A few ill-defined sclerotic areas within the thoracic spine could reflect metastases. Review of the MIP images confirms the above findings. IMPRESSION: No evidence of pulmonary embolus. One, possibly 2 separate large masses within the right breast extending to the skin surface with associated adjacent skin thickening and right axillary adenopathy. Findings compatible with right breast cancer. Large right pleural effusion which appears to have mass effect with complete collapse/compression of the right lung and shift of mediastinal structures to the left. Single enlarged left axillary lymph node. These results were called by telephone at the time of interpretation on 06/15/2020 at 10:43 pm to provider [facility] , who verbally acknowledged these results. Electronically Signed    By: Rolm Baptise M.D.   On: 06/15/2020 22:46   US Venous Img Lower Unilateral Left (DVT)  Result Date: 06/16/2020 CLINICAL DATA:  Initial evaluation for acute left lower extremity edema. EXAM: LEFT LOWER EXTREMITY VENOUS DOPPLER ULTRASOUND TECHNIQUE: Gray-scale sonography with graded compression, as well as color Doppler and duplex ultrasound were performed to evaluate the lower extremity deep venous systems from the level of the common femoral vein and including the common femoral, femoral, profunda femoral, popliteal and calf veins  including the posterior tibial, peroneal and gastrocnemius veins when visible. The superficial great saphenous vein was also interrogated. Spectral Doppler was utilized to evaluate flow at rest and with distal augmentation maneuvers in the common femoral, femoral and popliteal veins. COMPARISON:  None. FINDINGS: Contralateral Common Femoral Vein: Respiratory phasicity is normal and symmetric with the symptomatic side. No evidence of thrombus. Normal compressibility. Common Femoral Vein: No evidence of thrombus. Normal compressibility, respiratory phasicity and response to augmentation. Saphenofemoral Junction: No evidence of thrombus. Normal compressibility and flow on color Doppler imaging. Profunda Femoral Vein: No evidence of thrombus. Normal compressibility and flow on color Doppler imaging. Femoral Vein: No evidence of thrombus. Normal compressibility, respiratory phasicity and response to augmentation. Popliteal Vein: No evidence of thrombus. Normal compressibility, respiratory phasicity and response to augmentation. Calf Veins: No evidence of thrombus. Normal compressibility and flow on color Doppler imaging. Superficial Great Saphenous Vein: No evidence of thrombus. Normal compressibility. Venous Reflux:  None. Other Findings:  None. IMPRESSION: No evidence of deep venous thrombosis. Electronically Signed   By: Jeannine Boga M.D.   On: 06/16/2020 01:30   DG Chest  Port 1 View  Result Date: 06/17/2020 CLINICAL DATA:  Pleural effusion EXAM: PORTABLE CHEST 1 VIEW COMPARISON:  Yesterday FINDINGS: More dense and homogeneous right sided opacity. Right pleural catheter in stable position. Left lower lobe opacity which also appears increased. Likely no cardiomegaly when allowing for mediastinal shift. IMPRESSION: White out of the right chest with mediastinal mass effect from large pleural effusion. Retrocardiac infiltrate that could be infection or atelectasis. Electronically Signed   By: Monte Fantasia M.D.   On: 06/17/2020 05:00   DG Chest Portable 1 View  Result Date: 06/15/2020 CLINICAL DATA:  Right chest tube placement. EXAM: PORTABLE CHEST 1 VIEW COMPARISON:  Radiograph and CT earlier today. FINDINGS: Placement of right pigtail catheter. There is persistent complete opacification of the right hemithorax. There may be slight diminished mediastinal shift to the left. No visualized pneumothorax. No focal abnormality in the left lung. IMPRESSION: Placement of right pigtail catheter with persistent complete opacification of the right hemithorax. There may be slight diminished mediastinal shift to the left. No visualized pneumothorax. Electronically Signed   By: Keith Rake M.D.   On: 06/15/2020 23:40    PERFORMANCE STATUS (ECOG) : 1 - Symptomatic but completely ambulatory  Review of Systems Unless otherwise noted, a complete review of systems is negative.  Physical Exam General: NAD, frail appearing Cardiovascular: regular rate and rhythm Pulmonary: Unlabored, chest tube in place Abdomen: soft, nontender, + bowel sounds GU: no suprapubic tenderness Extremities: no edema, no joint deformities Skin: Large right-sided breast mass noted but not visualized Neurological: Weakness but otherwise nonfocal  IMPRESSION: I met with patient in the ICU.  She reports that her breathing is significantly improved although she continues to require chest tube and high  flow oxygen.  Cytology is pending with plan for biopsy if negative.  Work-up is ongoing.  Patient's goals seem aligned with the current scope of medical treatment.  She is interested in continuing the present scope of hospital care and pursuing work-up and possible treatment of her cancer.  Prior to this hospitalization, patient reports that she was living at home with her boyfriend.  She says that she was functionally independent with all of her own care and was still working assembling parts in a plant.  She says that she has 3 children, all of whom are involved.  She does not have any prior advance directives  but would be interested in her daughter being her healthcare power of attorney.  Will consult the chaplain to assist with completing ACP documents in the hospital.  PLAN: -Continue current scope of treatment -Consult chaplain to assist with ACP documents -Will plan to follow patient in the [place] after discharge from the hospital   Time Total: 45 minutes  Visit consisted of counseling and education dealing with the complex and emotionally intense issues of symptom management and palliative care in the setting of serious and potentially life-threatening illness.Greater than 50%  of this time was spent counseling and coordinating care related to the above assessment and plan.  Signed by: Altha Harm, PhD, NP-C

## 2020-06-17 NOTE — Progress Notes (Signed)
Hematology/Oncology Progress Note [facility] Telephone:([phone] [phone]  Patient Care Team: System, Provider Not In as PCP - General   Name of the patient: Jessica Willis  [id number]  May 24, 1966  Date of visit: 06/17/20   INTERVAL HISTORY-  Chest tube insertion yesterday, shortness of breath is better. She is admitted to ICU. She reports breathing is slightly better today, remains on nasal cannula oxygen 10 L. No family members at bedside.  Review of systems- Review of Systems  Unable to perform ROS: Severe respiratory distress  Constitutional: Positive for appetite change, fatigue and unexpected weight change.  Respiratory: Positive for cough and shortness of breath.   Cardiovascular: Negative for leg swelling.  Gastrointestinal: Negative for abdominal pain.  Skin: Negative for rash.  Neurological: Negative for light-headedness.  Psychiatric/Behavioral: Negative for confusion.    No Known Allergies  Patient Active Problem List   Diagnosis Date Noted  . Hemothorax on right 06/16/2020  . Acute respiratory failure with hypoxia ([place])   . Breast mass, right 06/15/2020  . Mastitis 06/15/2020  . Pleural effusion on right 06/15/2020  . Intestinal obstruction ([place])   . Leukocytosis   . Hypokalemia 06/24/2015  . Sepsis ([place]) 06/23/2015  . CAP (community acquired pneumonia) 06/23/2015  . Partial small bowel obstruction ([place]) 06/23/2015     Past Medical History:  Diagnosis Date  . Anemia   . Patient denies medical problems      Past Surgical History:  Procedure Laterality Date  . TUBAL LIGATION      Social History   Socioeconomic History  . Marital status: Single    Spouse name: Not on file  . Number of children: Not on file  . Years of education: Not on file  . Highest education level: Not on file  Occupational History  . Not on file  Tobacco Use  . Smoking status: Never Smoker  . Smokeless tobacco: Never Used   Substance and Sexual Activity  . Alcohol use: Not Currently    Alcohol/week: 1.0 standard drink    Types: 1 Cans of beer per week  . Drug use: No  . Sexual activity: Not on file  Other Topics Concern  . Not on file  Social History Narrative  . Not on file   Social Determinants of Health   Financial Resource Strain:   . Difficulty of Paying Living Expenses: Not on file  Food Insecurity:   . Worried About Charity fundraiser in the Last Year: Not on file  . Ran Out of Food in the Last Year: Not on file  Transportation Needs:   . Lack of Transportation (Medical): Not on file  . Lack of Transportation (Non-Medical): Not on file  Physical Activity:   . Days of Exercise per Week: Not on file  . Minutes of Exercise per Session: Not on file  Stress:   . Feeling of Stress : Not on file  Social Connections:   . Frequency of Communication with Friends and Family: Not on file  . Frequency of Social Gatherings with Friends and Family: Not on file  . Attends Religious Services: Not on file  . Active Member of Clubs or Organizations: Not on file  . Attends Archivist Meetings: Not on file  . Marital Status: Not on file  Intimate Partner Violence:   . Fear of Current or Ex-Partner: Not on file  . Emotionally Abused: Not on file  . Physically Abused: Not on file  . Sexually Abused:  Not on file     Family History  Problem Relation Age of Onset  . Diabetes Other   . Hypertension Other      Current Facility-Administered Medications:  .  acetaminophen (TYLENOL) tablet 650 mg, 650 mg, Oral, Q6H PRN, 650 mg at 06/16/20 1456 **OR** acetaminophen (TYLENOL) suppository 650 mg, 650 mg, Rectal, Q6H PRN, Jonelle Sidle, Mohammad L, MD .  benzonatate (TESSALON) capsule 100 mg, 100 mg, Oral, BID PRN, Darel Hong D, NP, 100 mg at 06/17/20 0258 .  ceFEPIme (MAXIPIME) 2 g in sodium chloride 0.9 % 100 mL IVPB, 2 g, Intravenous, Q8H, Dessa Phi, DO, Last Rate: 200 mL/hr at 06/17/20 0900, 2 g  at 06/17/20 0900 .  Chlorhexidine Gluconate Cloth 2 % PADS 6 each, 6 each, Topical, Q0600, Bradly Bienenstock, NP, 6 each at 06/17/20 0015 .  guaiFENesin-codeine 100-10 MG/5ML solution 10 mL, 10 mL, Oral, Q4H PRN, Sharion Settler, NP, 10 mL at 06/17/20 0229 .  lactated ringers infusion, , Intravenous, Continuous, Gala Romney L, MD, Last Rate: 125 mL/hr at 06/17/20 0850, New Bag at 06/17/20 0850 .  melatonin tablet 5 mg, 5 mg, Oral, QHS, Sharion Settler, NP, 5 mg at 06/16/20 2241 .  metoprolol tartrate (LOPRESSOR) injection 5 mg, 5 mg, Intravenous, Q6H PRN, Sharion Settler, NP, 5 mg at 06/16/20 2242 .  morphine 2 MG/ML injection 1-2 mg, 1-2 mg, Intravenous, Q4H PRN, Darel Hong D, NP, 2 mg at 06/16/20 2015 .  ondansetron (ZOFRAN) tablet 4 mg, 4 mg, Oral, Q6H PRN **OR** ondansetron (ZOFRAN) injection 4 mg, 4 mg, Intravenous, Q6H PRN, Jonelle Sidle, Mohammad L, MD .  traMADol (ULTRAM) tablet 50 mg, 50 mg, Oral, Q6H PRN, Dessa Phi, DO .  traZODone (DESYREL) tablet 50 mg, 50 mg, Oral, QHS PRN, Darel Hong D, NP, 50 mg at 06/17/20 0259 .  vancomycin (VANCOCIN) IVPB 1000 mg/200 mL premix, 1,000 mg, Intravenous, Q12H, Berton Mount, RPH, Last Rate: 200 mL/hr at 06/17/20 1042, 1,000 mg at 06/17/20 1042   Physical exam:  Vitals:   06/17/20 0900 06/17/20 1000 06/17/20 1100 06/17/20 1200  BP: 120/85 102/71 104/72 123/84  Pulse: (!) 122 (!) 122 (!) 122 (!) 123  Resp: (!) 35 (!) 9 18 13   Temp:      TempSrc:      SpO2: (!) 88% (!) 87% 97% (!) 85%  Weight:      Height:       Physical Exam Constitutional:      Comments: Mild respiratory distress  Cardiovascular:     Rate and Rhythm: Tachycardia present.     Heart sounds: Normal heart sounds.  Pulmonary:     Breath sounds: No wheezing.  Abdominal:     General: There is no distension.     Palpations: Abdomen is soft.  Musculoskeletal:        General: No swelling. Normal range of motion.     Cervical back: Normal range of motion.   Skin:    General: Skin is warm.     Coloration: Skin is not jaundiced.  Neurological:     General: No focal deficit present.     Mental Status: She is alert and oriented to person, place, and time.  Psychiatric:        Mood and Affect: Mood normal.    right breast mass involving skin      CMP Latest Ref Rng & Units 06/17/2020  Glucose 70 - 99 mg/dL 119(H)  BUN 6 - 20 mg/dL 17  Creatinine 0.44 -  1.00 mg/dL 0.75  Sodium 135 - 145 mmol/L 135  Potassium 3.5 - 5.1 mmol/L 4.4  Chloride 98 - 111 mmol/L 102  CO2 22 - 32 mmol/L 19(L)  Calcium 8.9 - 10.3 mg/dL 9.0  Total Protein 6.5 - 8.1 g/dL -  Total Bilirubin 0.3 - 1.2 mg/dL -  Alkaline Phos 38 - 126 U/L -  AST 15 - 41 U/L -  ALT 0 - 44 U/L -   CBC Latest Ref Rng & Units 06/17/2020  WBC 4.0 - 10.5 K/uL 18.7(H)  Hemoglobin 12.0 - 15.0 g/dL 14.7  Hematocrit 36 - 46 % 46.9(H)  Platelets 150 - 400 K/uL 438(H)    RADIOGRAPHIC STUDIES: I have personally reviewed the radiological images as listed and agreed with the findings in the report. DG Chest 1 View  Result Date: 06/16/2020 CLINICAL DATA:  Hemothorax on the right. EXAM: CHEST  1 VIEW COMPARISON:  Same day chest radiograph. FINDINGS: A right-sided pleural pigtail catheter is redemonstrated. There is interval improvement in aeration of the right lung with decreased size of a right hemothorax. A small right pneumothorax is not significantly changed in size. Severe right atelectasis/airspace disease is redemonstrated. Mild interstitial opacities in the left lung appear unchanged. There is no left pleural effusion or left pneumothorax. The cardiac silhouette is partially obscured but appears unchanged. IMPRESSION: 1. Interval improvement in aeration of the right lung with decreased size of a right hemothorax. 2. Unchanged small right pneumothorax with pleural pigtail catheter in place. 3. Severe right atelectasis/airspace disease. Electronically Signed   By: Zerita Boers M.D.   On:  06/16/2020 14:52   DG Chest 1 View  Result Date: 06/16/2020 CLINICAL DATA:  Cough and phlegm production. EXAM: CHEST  1 VIEW COMPARISON:  Chest radiograph dated 06/15/2020. FINDINGS: The cardiac silhouette is obscured. A right-sided pleural pigtail catheter is in unchanged location and there has been interval decrease in a large right pleural fluid collection (effusion versus hemothorax). There is no longer mediastinal shift to the left. There is a small right pneumothorax. Severe airspace and interstitial opacities are seen on the right. Mild interstitial opacities are seen on the left. There is no left pleural effusion or pneumothorax. The osseous structures are intact. IMPRESSION: 1. Small right pneumothorax with a right-sided pleural pigtail catheter in place. 2. Interval decrease in a large right pleural fluid collection (effusion versus hemothorax). 3. Severe airspace and interstitial opacities on the right. Mild interstitial opacities in the left. Electronically Signed   By: Zerita Boers M.D.   On: 06/16/2020 14:28   DG Chest 1 View  Result Date: 06/15/2020 CLINICAL DATA:  Short of breath, cough EXAM: CHEST  1 VIEW COMPARISON:  06/29/2015 FINDINGS: 2 frontal views of the chest demonstrate complete opacification of the right hemithorax, with slight mediastinal shift to the left, consistent with large right pleural effusion and underlying lung consolidation. Chronic areas of scarring are seen within the left chest. No pneumothorax. Cardiac silhouette is unremarkable. No acute bony abnormalities. IMPRESSION: 1. Opacification of the right hemithorax consistent with large right pleural effusion and underlying right lung consolidation. Electronically Signed   By: Randa Ngo M.D.   On: 06/15/2020 21:42   CT Angio Chest PE W and/or Wo Contrast  Result Date: 06/15/2020 CLINICAL DATA:  Cough, shortness of breath EXAM: CT ANGIOGRAPHY CHEST WITH CONTRAST TECHNIQUE: Multidetector CT imaging of the chest  was performed using the standard protocol during bolus administration of intravenous contrast. Multiplanar CT image reconstructions and MIPs were obtained  to evaluate the vascular anatomy. CONTRAST:  100 mL Omnipaque 350 IV COMPARISON:  06/30/2015 FINDINGS: Cardiovascular: No filling defects in the pulmonary arteries to suggest pulmonary emboli. Heart is normal size. Aorta normal caliber. Mediastinum/Nodes: No visible mediastinal or hilar adenopathy. Bilateral axillary adenopathy, right greater than left. Index right axillary lymph node has a short axis diameter of 13 mm. Left axillary lymph node has a short axis diameter of 11 mm. Lungs/Pleura: Large right pleural effusion completely compressed seen in the right lung which is not aerated. This appears to have mass effect with shift of the right lung and mediastinal structures to the left. Areas of atelectasis or scarring in the left lung base. No effusion on the left. Upper Abdomen: Imaging into the upper abdomen demonstrates no acute findings. Musculoskeletal: Large mass noted in the right breast measuring 8 x 6 cm. Possible separate right breast mass more inferiorly in the right breast measuring 8 x 4 cm. Skin thickening or edema noted. Associated right axillary adenopathy. A few ill-defined sclerotic areas within the thoracic spine could reflect metastases. Review of the MIP images confirms the above findings. IMPRESSION: No evidence of pulmonary embolus. One, possibly 2 separate large masses within the right breast extending to the skin surface with associated adjacent skin thickening and right axillary adenopathy. Findings compatible with right breast cancer. Large right pleural effusion which appears to have mass effect with complete collapse/compression of the right lung and shift of mediastinal structures to the left. Single enlarged left axillary lymph node. These results were called by telephone at the time of interpretation on 06/15/2020 at 10:43 pm to  provider [facility] , who verbally acknowledged these results. Electronically Signed   By: Rolm Baptise M.D.   On: 06/15/2020 22:46   US Venous Img Lower Unilateral Left (DVT)  Result Date: 06/16/2020 CLINICAL DATA:  Initial evaluation for acute left lower extremity edema. EXAM: LEFT LOWER EXTREMITY VENOUS DOPPLER ULTRASOUND TECHNIQUE: Gray-scale sonography with graded compression, as well as color Doppler and duplex ultrasound were performed to evaluate the lower extremity deep venous systems from the level of the common femoral vein and including the common femoral, femoral, profunda femoral, popliteal and calf veins including the posterior tibial, peroneal and gastrocnemius veins when visible. The superficial great saphenous vein was also interrogated. Spectral Doppler was utilized to evaluate flow at rest and with distal augmentation maneuvers in the common femoral, femoral and popliteal veins. COMPARISON:  None. FINDINGS: Contralateral Common Femoral Vein: Respiratory phasicity is normal and symmetric with the symptomatic side. No evidence of thrombus. Normal compressibility. Common Femoral Vein: No evidence of thrombus. Normal compressibility, respiratory phasicity and response to augmentation. Saphenofemoral Junction: No evidence of thrombus. Normal compressibility and flow on color Doppler imaging. Profunda Femoral Vein: No evidence of thrombus. Normal compressibility and flow on color Doppler imaging. Femoral Vein: No evidence of thrombus. Normal compressibility, respiratory phasicity and response to augmentation. Popliteal Vein: No evidence of thrombus. Normal compressibility, respiratory phasicity and response to augmentation. Calf Veins: No evidence of thrombus. Normal compressibility and flow on color Doppler imaging. Superficial Great Saphenous Vein: No evidence of thrombus. Normal compressibility. Venous Reflux:  None. Other Findings:  None. IMPRESSION: No evidence of deep venous thrombosis.  Electronically Signed   By: Jeannine Boga M.D.   On: 06/16/2020 01:30   DG Chest Port 1 View  Result Date: 06/17/2020 CLINICAL DATA:  Pleural effusion EXAM: PORTABLE CHEST 1 VIEW COMPARISON:  Yesterday FINDINGS: More dense and homogeneous right sided opacity. Right pleural  catheter in stable position. Left lower lobe opacity which also appears increased. Likely no cardiomegaly when allowing for mediastinal shift. IMPRESSION: White out of the right chest with mediastinal mass effect from large pleural effusion. Retrocardiac infiltrate that could be infection or atelectasis. Electronically Signed   By: Monte Fantasia M.D.   On: 06/17/2020 05:00   DG Chest Portable 1 View  Result Date: 06/15/2020 CLINICAL DATA:  Right chest tube placement. EXAM: PORTABLE CHEST 1 VIEW COMPARISON:  Radiograph and CT earlier today. FINDINGS: Placement of right pigtail catheter. There is persistent complete opacification of the right hemithorax. There may be slight diminished mediastinal shift to the left. No visualized pneumothorax. No focal abnormality in the left lung. IMPRESSION: Placement of right pigtail catheter with persistent complete opacification of the right hemithorax. There may be slight diminished mediastinal shift to the left. No visualized pneumothorax. Electronically Signed   By: Keith Rake M.D.   On: 06/15/2020 23:40    Assessment and plan-   #Sepsis secondary to right breast cellulitis, Continue IV vancomycin and Maxipime.   #Acute hypoxic respiratory failure, secondary to right pleural effusion Status post thoracentesis and chest tube insertion.  Continue nasal cannula oxygen. Pleural fluid is Likely malignant. Exudate.    # Leukocytosis and thrombocytosis are likely reactive, due to sepsis.  #Large right breast mass with skin involvement. Awaiting cytology.  She may need breast biopsy if cytology is not conclusive or if there is need for additional tissue specimen for testing.   Highly suspicious for right breast cancer with malignant pleural effusion further treatment pending above work-up.  Palliative care medicine has been consulted for goals of care discussion.  DVT prophylaxis with Lovenox. Thank you for allowing me to participate in the care of this patient.   Earlie Server, MD, PhD Hematology Oncology [facility] at [facility] Pager- [phone] 06/17/2020

## 2020-06-18 ENCOUNTER — Inpatient Hospital Stay: Payer: Medicaid Other

## 2020-06-18 LAB — CBC
HCT: 31.6 % — ABNORMAL LOW (ref 36.0–46.0)
Hemoglobin: 10.1 g/dL — ABNORMAL LOW (ref 12.0–15.0)
MCH: 26 pg (ref 26.0–34.0)
MCHC: 32 g/dL (ref 30.0–36.0)
MCV: 81.2 fL (ref 80.0–100.0)
Platelets: 368 10*3/uL (ref 150–400)
RBC: 3.89 MIL/uL (ref 3.87–5.11)
RDW: 12.9 % (ref 11.5–15.5)
WBC: 16.5 10*3/uL — ABNORMAL HIGH (ref 4.0–10.5)
nRBC: 0 % (ref 0.0–0.2)

## 2020-06-18 LAB — MAGNESIUM: Magnesium: 1.9 mg/dL (ref 1.7–2.4)

## 2020-06-18 LAB — BASIC METABOLIC PANEL
Anion gap: 7 (ref 5–15)
BUN: 14 mg/dL (ref 6–20)
CO2: 28 mmol/L (ref 22–32)
Calcium: 8.7 mg/dL — ABNORMAL LOW (ref 8.9–10.3)
Chloride: 99 mmol/L (ref 98–111)
Creatinine, Ser: 0.66 mg/dL (ref 0.44–1.00)
GFR, Estimated: >60 mL/min (ref >60)
Glucose, Bld: 106 mg/dL — ABNORMAL HIGH (ref 70–99)
Potassium: 4.1 mmol/L (ref 3.5–5.1)
Sodium: 134 mmol/L — ABNORMAL LOW (ref 135–145)

## 2020-06-18 LAB — PH, BODY FLUID: pH, Body Fluid: 7.2

## 2020-06-18 LAB — TRIGLYCERIDES, BODY FLUIDS: Triglycerides, Fluid: 30 mg/dL

## 2020-06-18 LAB — LACTIC ACID, PLASMA: Lactic Acid, Venous: 1.6 mmol/L (ref 0.5–1.9)

## 2020-06-18 LAB — PHOSPHORUS: Phosphorus: 3.2 mg/dL (ref 2.5–4.6)

## 2020-06-18 IMAGING — DX DG CHEST 1V PORT
1 series · 1 of 1 positions shown · non-contrast
Comparison: Portable exam [7V] hours compared to 11 x 21

CLINICAL DATA: Malignant pleural effusion, RIGHT-side chest tube

EXAM:
PORTABLE CHEST 1 VIEW

[chest ap]
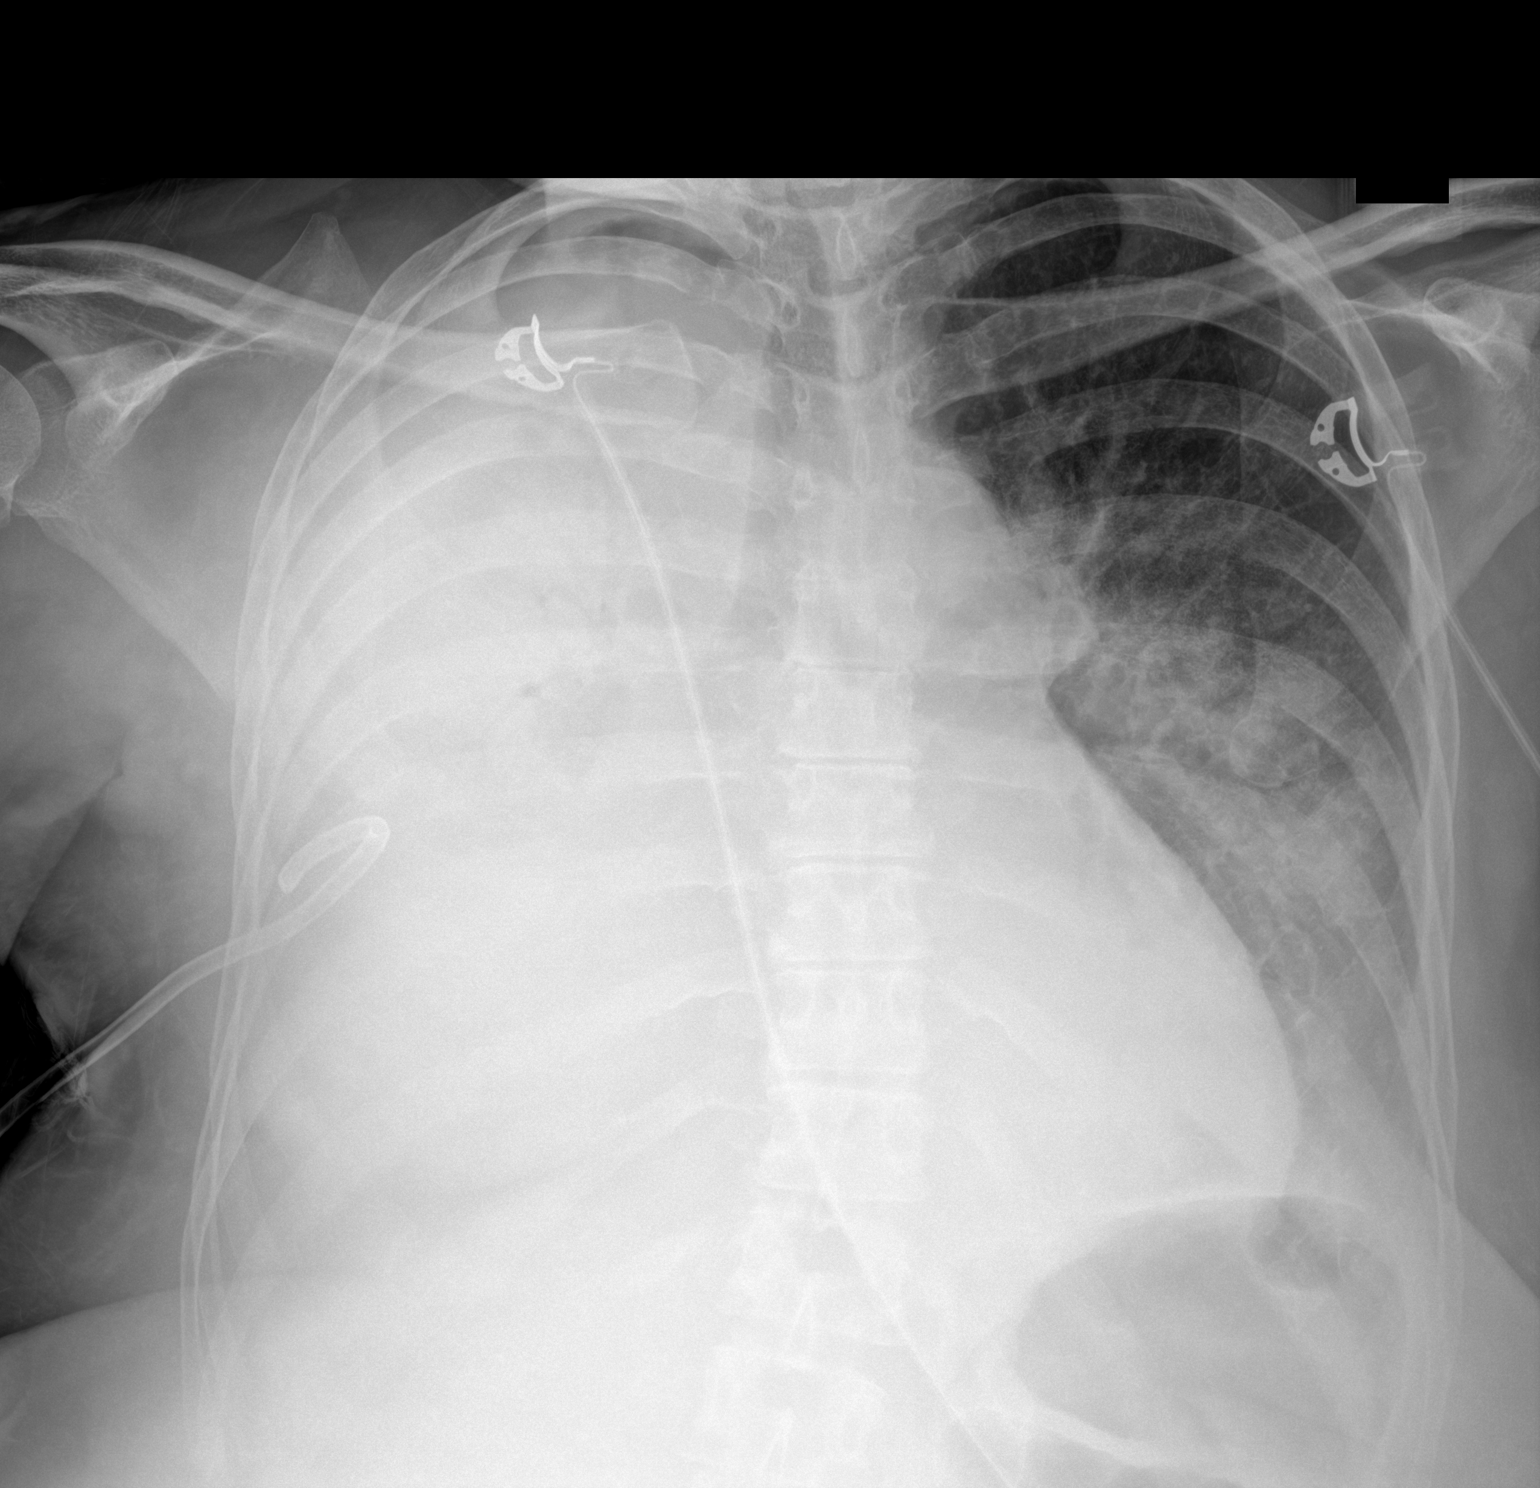

[1 of 1 positions shown; findings below may reference images not displayed]

FINDINGS: Pigtail RIGHT thoracostomy tube again seen.

Persistent subtotal opacification of the RIGHT hemithorax by
effusion and atelectasis, underlying tumor not excluded.

Atelectasis versus consolidation of LEFT lower lobe with probable
small pleural effusion.

Stable heart size.

No acute osseous findings.
IMPRESSION: Persistent subtotal opacification of the RIGHT hemithorax by
effusion and atelectasis, underlying tumor not excluded.

Persistent infiltrate and probable pleural effusion at inferior LEFT
chest.

## 2020-06-18 MED ORDER — LORAZEPAM 2 MG/ML IJ SOLN
1.0000 mg | Freq: Four times a day (QID) | INTRAMUSCULAR | Status: DC | PRN
  Administered 2020-06-18 – 2020-06-20 (×2): 1 mg via INTRAVENOUS
  Filled 2020-06-18 (×2): qty 1

## 2020-06-18 MED ORDER — LORAZEPAM 2 MG/ML IJ SOLN: 2.0000 mg | Freq: Once | INTRAMUSCULAR | Status: AC

## 2020-06-18 MED ORDER — SODIUM CHLORIDE 0.9 % IV BOLUS
1000.0000 mL | Freq: Once | INTRAVENOUS | Status: AC
  Administered 2020-06-18: 1000 mL via INTRAVENOUS

## 2020-06-18 MED ORDER — LORAZEPAM 2 MG/ML IJ SOLN
INTRAMUSCULAR | Status: AC
  Administered 2020-06-18: 2 mg via INTRAVENOUS
  Filled 2020-06-18: qty 1

## 2020-06-18 MED ORDER — MAGNESIUM SULFATE 2 GM/50ML IV SOLN
2.0000 g | Freq: Once | INTRAVENOUS | Status: AC
  Administered 2020-06-18: 2 g via INTRAVENOUS
  Filled 2020-06-18: qty 50

## 2020-06-18 MED ORDER — SODIUM CHLORIDE 0.9% FLUSH: 10.0000 mL | INTRAVENOUS | Status: DC | PRN

## 2020-06-18 MED ORDER — SODIUM CHLORIDE 0.9% FLUSH
10.0000 mL | Freq: Two times a day (BID) | INTRAVENOUS | Status: DC
  Administered 2020-06-18 – 2020-06-26 (×12): 10 mL

## 2020-06-18 NOTE — Progress Notes (Signed)
CC: Malignant pleural effusion and right breast mass Subjective: Patient continues to have dyspnea.  Chest x-ray personally reviewed as well as CT scan and multiple x-rays.  There is small apical lung parenchyma likely from the right upper lobe that is still I related.  This is only partial.  The significant white out of most of the right thoracic cavity.  There is evidence of a large breast mass.  She is still requiring significant amounts of oxygen with pressure.  Objective: Vital signs in last 24 hours: Temp:  [97.3 F (36.3 C)-99.9 F (37.7 C)] 97.5 F (36.4 C) (11/06 1400) Pulse Rate:  [108-141] 124 (11/06 1700) Resp:  [20-43] 30 (11/06 1700) BP: (109-205)/(60-136) 135/81 (11/06 1700) SpO2:  [79 %-100 %] 94 % (11/06 1700) FiO2 (%):  [60 %-100 %] 60 % (11/06 0200) Last BM Date: 06/15/20  Intake/Output from previous day: 11/05 0701 - 11/06 0700 In: 3780.6 [I.V.:2763.7; IV Piggyback:1016.9] Out: 750 [Urine:450; Chest Tube:300] Intake/Output this shift: Total I/O In: 1667.9 [I.V.:1322.9; IV Piggyback:344.9] Out: 350 [Urine:350]  Physical exam:  Very debilitated on BiPAP Chest: Right pigtail in place, decrease bs on the right.   Lab Results: CBC  Recent Labs    06/17/20 0453 06/18/20 0529  WBC 18.7* 16.5*  HGB 14.7 10.1*  HCT 46.9* 31.6*  PLT 438* 368   BMET Recent Labs    06/17/20 0453 06/18/20 0529  NA 135 134*  K 4.4 4.1  CL 102 99  CO2 19* 28  GLUCOSE 119* 106*  BUN 17 14  CREATININE 0.75 0.66  CALCIUM 9.0 8.7*   PT/INR Recent Labs    06/16/20 0034  LABPROT 15.1  INR 1.2   ABG Recent Labs    06/15/20 2125  HCO3 28.2*    Studies/Results: DG CHEST PORT 1 VIEW  Result Date: 06/18/2020 CLINICAL DATA:  Malignant pleural effusion, RIGHT-side chest tube EXAM: PORTABLE CHEST 1 VIEW COMPARISON:  Portable exam 1042 hours compared to 11 x 21 FINDINGS: Pigtail RIGHT thoracostomy tube again seen. Persistent subtotal opacification of the RIGHT  hemithorax by effusion and atelectasis, underlying tumor not excluded. Atelectasis versus consolidation of LEFT lower lobe with probable small pleural effusion. Stable heart size. No acute osseous findings. IMPRESSION: Persistent subtotal opacification of the RIGHT hemithorax by effusion and atelectasis, underlying tumor not excluded. Persistent infiltrate and probable pleural effusion at inferior LEFT chest. Electronically Signed   By: Lavonia Dana M.D.   On: 06/18/2020 16:09   DG Chest Port 1 View  Result Date: 06/17/2020 CLINICAL DATA:  Chest x-ray 06/17/2020 5 a.m. EXAM: PORTABLE CHEST 1 VIEW COMPARISON:  Chest x-ray 06/17/2020 4:10 a.m. FINDINGS: Interval increase in size of a likely large right pleural effusion with slight aeration of the right lung. Likely trace pleural effusion on the left. Interval increase in airspace interstitial opacities of the left lung that is most prominent in the left mid lung zone. Right pigtail chest tube overlies the right hemithorax. IMPRESSION: 1. Slightly improved almost complete whiteout of the right chest with slightly decreased in size large pleural effusion. 2. Interval increase in interstitial and alveolar left lung opacities with possible trace left pleural effusion. Electronically Signed   By: Iven Finn M.D.   On: 06/17/2020 21:30   DG Chest Port 1 View  Result Date: 06/17/2020 CLINICAL DATA:  Pleural effusion EXAM: PORTABLE CHEST 1 VIEW COMPARISON:  Yesterday FINDINGS: More dense and homogeneous right sided opacity. Right pleural catheter in stable position. Left lower lobe opacity which also appears  increased. Likely no cardiomegaly when allowing for mediastinal shift. IMPRESSION: White out of the right chest with mediastinal mass effect from large pleural effusion. Retrocardiac infiltrate that could be infection or atelectasis. Electronically Signed   By: Monte Fantasia M.D.   On: 06/17/2020 05:00    Anti-infectives: Anti-infectives (From admission,  onward)   Start     Dose/Rate Route Frequency Ordered Stop   06/17/20 0815  ceFEPIme (MAXIPIME) 2 g in sodium chloride 0.9 % 100 mL IVPB        2 g 200 mL/hr over 30 Minutes Intravenous Every 8 hours 06/17/20 0718     06/16/20 2200  vancomycin (VANCOCIN) IVPB 1000 mg/200 mL premix  Status:  Discontinued        1,000 mg 200 mL/hr over 60 Minutes Intravenous Every 12 hours 06/16/20 1005 06/16/20 1108   06/16/20 2200  vancomycin (VANCOCIN) IVPB 1000 mg/200 mL premix        1,000 mg 200 mL/hr over 60 Minutes Intravenous Every 12 hours 06/16/20 1123     06/16/20 0900  vancomycin (VANCOCIN) IVPB 1000 mg/200 mL premix  Status:  Discontinued        1,000 mg 200 mL/hr over 60 Minutes Intravenous Every 8 hours 06/16/20 0102 06/16/20 1005   06/16/20 0045  vancomycin (VANCOREADY) IVPB 2000 mg/400 mL        2,000 mg 200 mL/hr over 120 Minutes Intravenous  Once 06/16/20 0031 06/16/20 0307   06/16/20 0015  vancomycin (VANCOCIN) IVPB 1000 mg/200 mL premix  Status:  Discontinued        1,000 mg 200 mL/hr over 60 Minutes Intravenous  Once 06/16/20 0010 06/16/20 0031   06/16/20 0009  cefTRIAXone (ROCEPHIN) 2 g in sodium chloride 0.9 % 100 mL IVPB  Status:  Discontinued        2 g 200 mL/hr over 30 Minutes Intravenous Every 24 hours 06/16/20 0010 06/16/20 0034   06/15/20 2215  cefTRIAXone (ROCEPHIN) 2 g in sodium chloride 0.9 % 100 mL IVPB  Status:  Discontinued        2 g 200 mL/hr over 30 Minutes Intravenous Every 24 hours 06/15/20 2200 06/16/20 1108   06/15/20 2215  azithromycin (ZITHROMAX) 500 mg in sodium chloride 0.9 % 250 mL IVPB  Status:  Discontinued        500 mg 250 mL/hr over 60 Minutes Intravenous Every 24 hours 06/15/20 2200 06/16/20 1108      Assessment/Plan:  Complex malignant pleural effusion in the right side from presumed metastatic breast cancer.  Very unfortunate situation.  Pigtail is working however there is significant collapse of the right long and only functional partial right  upper lobe.  I personally think that this right hemothorax is long gone from trap malignant pleural effusion and disease within the right thoracic cavity. I firmly believe that the most important step to do at this time is to establish goals of care with the patient and the family.  At best positive fats might be able to give her a few more weeks of relief with a potential of a Pleurx catheter.  She is very debilitated weak and actively dying and may deteriorate at any minute. I do agree that waiting for Dr. Kalman Shan to evaluate for potential positive VATS with pleural VAC might be in her best interest if she in fact wishes to go that route. Please note that I spent over 35 minutes in this encounter with greater than 50% spent in coordination and counseling of her  care Caroleen Hamman, MD, [facility]  06/18/2020

## 2020-06-18 NOTE — Progress Notes (Signed)
°  PROGRESS NOTE  Checked in on patient this afternoon.  Patient remains on BiPAP without acute change since examination this morning.  Remains tachycardic, ill-appearing.  Patient had no new complaints.     Dessa Phi, DO [facility] 06/18/2020, 2:59 PM  Available via Epic secure chat 7am-7pm After these hours, please refer to coverage provider listed on [email or web address]

## 2020-06-18 NOTE — Progress Notes (Signed)
[place] visited pt. several times today, initially finding pt. asleep at 12:30pm and then in the midst of eating lunch/dinner at 4pm; [facility] paged to assist pt./dtr. with completion of AD this evening; Welcome explained document to dtr. and pt. No further needs expressed at this time; chaplains remain available to support pt. and family as needed.

## 2020-06-18 NOTE — Consult Note (Signed)
NAME:  Jessica Willis, MRN:  [id number], DOB:  1966-01-22, LOS: 3 ADMISSION DATE:  06/15/2020, CONSULTATION DATE:  06/16/2020 REFERRING MD:  Dr. Maylene Roes, CHIEF COMPLAINT: Shortness of Breath, cough   Brief History    Jessica Willis is a 54 y.o. Female with a past medical history of anemia, adnd R Pleural empyema strep pyogenes in 2016 who presented to [facility] ED on 06/15/20 due to a 1 week history of progressive shortness of breath and cough.  She also reported bone swelling of the right breast for at least 6 months, but did not seek any medical attention.  Upon presentation she was noted to have a large right breast mass with a duplicate mass above the breast which looked necrotic with bullae and purulent discharge.  ED Course: Temperature 98.2 blood pressure 180/107 pulse 144 respirate 38 oxygen sat 94% on room air.  White count 15.1 hemoglobin 11.4 and platelets count of 565.  Sodium is 143 potassium 3.3 chloride 101 CO2 25 BUN 22 and creatinine was 0.61.  Lactic acid 2.8 glucose 146 and calcium 9.5.  CT angiogram of the chest shows no evidence of PE.  There was one possibily 2 separate large mass seen within the right breast extending to the skin surface was associated skin thickening or right axillary adenopathy.  Findings are consistent with right breast cancer.  Is also a large right pleural effusion with mass-effect with complete collapse of the right lung and shift of mediastinal structures the left.  There is a single enlarged left axillary lymph node.  Thoracentesis was performed with insertion of pigtail catheter in the ER, which yielded 500 cc of serosanguineous fluid removed. cytolog sent 06/15/20  She was admitted by the Hospitalist for further workup and treatment of  Acute Hypoxic Respiratory Failure in the setting of Large Right Pleural Effusion (suspected malignant pleural effusion) due to Right Breast Mass and Sepsis in the setting of Right Breast Cellulitis.  On 06/16/20 she  became tachycardic with RR in the 20's and worsening hypoxia.  Follow-up chest x-ray showed persistent complete opacification of the right hemithorax, along with slight mediastinal shift to the left.  General Surgery was consulted to evaluate need for larger chest tube placement. General Surgery placed pigtail to suction, which drained 3.5L dark Serosanguinous fluid (did not appear like fresh blood).  Cardiothoracic Surgery was consulted to ensue no concern for active bleeding or need for possible surgical intervention.  Follow up chest x-rays showed that the pleural effusion was improving, and output has slowed down considerably.  Given her high risk of decompensation, she was changed to Stepdown status with PCCM consultation.  Oncology and Palliative Care have also been consulted.   Past Medical History  Anemia Consults:  Hospitalist (Primary Service) Cardiothoracic Surgery General Surgery Oncology Palliative Care PCCM  Procedures:  11/3: Right Pigtail Catheter inserted  Significant Diagnostic Tests:  11/3: CTA Chest>>No evidence of pulmonary embolus. One, possibly 2 separate large masses within the right breast extending to the skin surface with associated adjacent skin thickening and right axillary adenopathy. Findings compatible with right breast cancer. Large right pleural effusion which appears to have mass effect with complete collapse/compression of the right lung and shift of mediastinal structures to the left. Single enlarged left axillary lymph node. 11/3: CXR>>Placement of right pigtail catheter with persistent complete opacification of the right hemithorax. There may be slight diminished mediastinal shift to the left. No visualized pneumothorax. 11/4: Venous US Left LE>>No evidence of deep venous thrombosis. 11/4: CXR>>1. Small  right pneumothorax with a right-sided pleural pigtail catheter in place. 2. Interval decrease in a large right pleural fluid  collection (effusion versus hemothorax). 3. Severe airspace and interstitial opacities on the right. Mild interstitial opacities in the left. 11/4: CXR>>1. Interval improvement in aeration of the right lung with decreased size of a right hemothorax. 2. Unchanged small right pneumothorax with pleural pigtail catheter in place. 3. Severe right atelectasis/airspace disease.  Micro Data:  11/3: SARS-CoV-2 PCR>> negative 11/3: Influenza PCR>> negative 11/3: HIV Screen>> non-reactive 11/3: Blood culture x2>> 11/3: Urine culture>> 11/3: Pleural Fluid Culture>>  Antimicrobials:  Azithromycin 11/4>>11/4 Ceftriaxone 11/3>>11/4 Vancomycin 11/4>>   Significant Hospital Events   11/3: Presented to ED, to be admitted by Hospitalist 11/5 - Currently on 8L [employer] Hemodynamically stable on pressors; Afebrile Currently denies chest pain, SOB, abdominal pain, N/V/D, fever/chills   Interim history/subjective:   06/18/2020 - n now on BiPAP.  Continue respiratory failure.  Continued right-sided whiteout.  The pigtail catheter drains but although it is slow and intermittently has been blocked in the past.  Patient family correct dialysis like 60% rate of fluid  Objective   Blood pressure (!) 155/91, pulse (!) 117, temperature (!) 97.3 F (36.3 C), temperature source Axillary, resp. rate (!) 24, height 5\' 10"  (1.778 m), weight 86.2 kg, last menstrual period 06/16/2015, SpO2 90 %.    FiO2 (%):  [60 %-100 %] 60 %   Intake/Output Summary (Last 24 hours) at 06/18/2020 1510 Last data filed at 06/18/2020 1048 Gross per 24 hour  Intake 4166.29 ml  Output 750 ml  Net 3416.29 ml   Filed Weights   06/15/20 2114  Weight: 86.2 kg    Examination: She is on bipap Alert RT chest firm tbecuase of mass Mild distress Oriented ABd soft   LABS    PULMONARY Recent Labs  Lab 06/15/20 2125  HCO3 28.2*  O2SAT 67.2    CBC Recent Labs  Lab 06/16/20 0034 06/16/20 1537 06/16/20 1636 06/17/20 0453  06/18/20 0529  HGB 10.3*   < > 14.6 14.7 10.1*  HCT 33.5*   < > 46.0 46.9* 31.6*  WBC 13.0*  --   --  18.7* 16.5*  PLT 465*  --   --  438* 368   < > = values in this interval not displayed.    COAGULATION Recent Labs  Lab 06/16/20 0034  INR 1.2    CARDIAC  No results for input(s): TROPONINI in the last 168 hours. No results for input(s): PROBNP in the last 168 hours.   CHEMISTRY Recent Labs  Lab 06/15/20 2125 06/15/20 2125 06/16/20 0034 06/16/20 0034 06/17/20 0453 06/18/20 0529  NA 143  --  140  --  135 134*  K 3.3*   < > 3.3*   < > 4.4 4.1  CL 101  --  100  --  102 99  CO2 25  --  27  --  19* 28  GLUCOSE 146*  --  124*  --  119* 106*  BUN 22*  --  20  --  17 14  CREATININE 0.61  --  0.56  --  0.75 0.66  CALCIUM 9.5  --  9.1  --  9.0 8.7*  MG 2.2  --   --   --  2.0 1.9  PHOS  --   --   --   --   --  3.2   < > = values in this interval not displayed.   Estimated Creatinine Clearance:  94.8 mL/min (by C-G formula based on SCr of 0.66 mg/dL).   LIVER Recent Labs  Lab 06/15/20 2125 06/16/20 0034  AST 43* 38  ALT 29 27  ALKPHOS 107 102  BILITOT 1.0 0.8  PROT 9.0* 8.8*  ALBUMIN 4.0 3.9  3.9  INR  --  1.2     INFECTIOUS Recent Labs  Lab 06/15/20 2125 06/15/20 2125 06/16/20 0034 06/16/20 0034 06/16/20 0514 06/17/20 1105 06/18/20 0753  LATICACIDVEN 2.8*   < > 2.5*   < > 2.3* 2.3* 1.6  PROCALCITON 24.07  --  21.52  --   --   --   --    < > = values in this interval not displayed.     ENDOCRINE CBG (last 3)  No results for input(s): GLUCAP in the last 72 hours.       IMAGING x48h  - image(s) personally visualized  -   highlighted in bold DG Chest Port 1 View  Result Date: 06/17/2020 CLINICAL DATA:  Chest x-ray 06/17/2020 5 a.m. EXAM: PORTABLE CHEST 1 VIEW COMPARISON:  Chest x-ray 06/17/2020 4:10 a.m. FINDINGS: Interval increase in size of a likely large right pleural effusion with slight aeration of the right lung. Likely trace pleural  effusion on the left. Interval increase in airspace interstitial opacities of the left lung that is most prominent in the left mid lung zone. Right pigtail chest tube overlies the right hemithorax. IMPRESSION: 1. Slightly improved almost complete whiteout of the right chest with slightly decreased in size large pleural effusion. 2. Interval increase in interstitial and alveolar left lung opacities with possible trace left pleural effusion. Electronically Signed   By: Iven Finn M.D.   On: 06/17/2020 21:30   DG Chest Port 1 View  Result Date: 06/17/2020 CLINICAL DATA:  Pleural effusion EXAM: PORTABLE CHEST 1 VIEW COMPARISON:  Yesterday FINDINGS: More dense and homogeneous right sided opacity. Right pleural catheter in stable position. Left lower lobe opacity which also appears increased. Likely no cardiomegaly when allowing for mediastinal shift. IMPRESSION: White out of the right chest with mediastinal mass effect from large pleural effusion. Retrocardiac infiltrate that could be infection or atelectasis. Electronically Signed   By: Monte Fantasia M.D.   On: 06/17/2020 05:00     Resolved Hospital Problem list   N/A  Assessment & Plan:   Acute Hypoxic Respiratory Failure in the setting of large Right Pleural Effusion (suspected malignant pleural effusion) -Initial concern for possible Hemithorax, fluid from pigtail catheter with Serosanguinous (didn't appear like fresh blood) drainage which has slowed considerably, H&H has remained stable ~ thus suspicion is for Malignant Pleural Effusion   06/18/2020 - worsening resp distress. Rt side white out. Pig tail working so so  Plan - bipap - likely needs VATS v large bore chest tube for relief -will reach out to surgery - intubte If worse -> mechanically ventilate   Right Breast Mass -Oncology following, appreciate input -Cytology pending   Sepsis secondary to Right Breast Cellulitis Per [facility] and [facility]    Best practice:  Diet: Regular  Diet Pain/Anxiety/Delirium protocol (if indicated): Prn Morphine VAP protocol (if indicated): N/A DVT prophylaxis: Lovenox GI prophylaxis: N/A Glucose control: N/A  Mobility: As tolerated  Code Status: full code per Dr Maylene Roes primary.  Family Communication: Updated pt at bedside 06/18/20. Called son [name] 336 [address]ine to daughter [phone]. Infrmed her of curren status. Also, informed that my understanding is breast cancer is advanced and hospice appropriate  as relayed to me bysurgery.  Requested she consider comfort care and address goals of care. sHe said she needs 1h to be at bedside and address issues with patient/mom  Disposition: Stepdown       ATTESTATION & SIGNATURE   The patient Tyese Finken is critically ill with multiple organ systems failure and requires high complexity decision making for assessment and support, frequent evaluation and titration of therapies, application of advanced monitoring technologies and extensive interpretation of multiple databases.   Critical Care Time devoted to patient care services described in this note is  35  Minutes. This time reflects time of care of this signee Dr Brand Males. This critical care time does not reflect procedure time, or teaching time or supervisory time of PA/NP/Med student/Med Resident etc but could involve care discussion time     Dr. Brand Males, M.D., [facility].C.P Pulmonary and Critical Care Medicine Staff Physician [place] [facility] Pager: [phone], If no answer or between  15:00h - 7:00h: call [phone]  06/18/2020 3:45 PM

## 2020-06-18 NOTE — Progress Notes (Signed)
Did repeat rounds on the patient.  This time she is off BiPAP and trying to eat.  She is on heated high flow nasal cannula.  However saturations around 88%.  She is still able to eat.  She looks fatigued.  With the respiratory distress does not seem as bad as when she was on BiPAP  Uses opportunity with the bedside nurse to examine her right lateral chest wall.  She has a Band-Aid and a pigtail catheter present.  She also has extremely fungating right-sided breast mass.  She has significant skin infiltration of the breast cancer with significant induration along the right infra axillary area that the since into the right upper quadrant of the abdomen on the lateral wall and the anterior wall.  This is directly in the pathway of a potential chest tube [large-bore] 4 exudative fluid drainage drainage  Therefore resolved -She likely requires VATS but in the presence of significant respiratory distress she would need a large bore chest tube to give her therapeutic relief.  However this probably best done [given the significant induration with tumor of the right lateral chest wall] in the operating room under control setting.  If it is in the bedside then best time to do it is if she gets intubated  -Doing an awake large-bore chest tube with significant chest wall induration with tumor with patient on heated high flow nasal cannula and intermittently requiring BiPAP can be risky even though the very driving force of the respiratory distress is likely the pleural effusion   -I placed a call to general surgery to discuss my thoughts     SIGNATURE    Dr. Brand Males, M.D., F.C.C.P,  Pulmonary and Critical Care Medicine Staff Physician, [place] Director - Interstitial Lung Disease  Program  Pulmonary [place] [facility] [place], [place], [id number]  Pager: [phone], If no answer  OR between  19:00-7:00h: page [phone] Telephone  (clinical office): [phone] Telephone (research): [phone]  4:22 PM 06/18/2020

## 2020-06-18 NOTE — Progress Notes (Signed)
PROGRESS NOTE    Jessica Willis  XLK:[id number] DOB: 1965/11/06 DOA: 06/15/2020 PCP: System, Provider Not In     Brief Narrative:  Jessica Willis is a 54 year old female with past medical history significant for anemia who presents with chief complaint of shortness of breath and cough.  She states that she has had some shortness of breath, especially worsening in the past week.  She also admits to having a large right breast mass that started about 6 months ago.  She has not sought any medical care for this.  In the emergency department, imaging revealed two separate right-sided breast masses, right axillary adenopathy, enlarged left axillary lymph node, large right-sided pleural effusion with mass-effect and left mediastinal shift, near collapse of the right lung.  She underwent thoracentesis with pigtail catheter placement in the emergency department.  Repeat chest x-ray revealed persistent right-sided pleural effusion.  She was started on IV antibiotics.  General surgery was consulted and additional 3.7 L of serosanguineous fluid was removed from her pleural space.  Cardiothoracic surgery was consulted to comanage the chest tube.  Oncology as well as palliative care were consulted.  Due to continued tachycardia and worsening respiratory status, patient was transferred to stepdown unit with ICU consult.  New events last 24 hours / Subjective: Had desaturations with tachycardia overnight, was placed on BiPAP.  She remains on BiPAP this morning, no new complaints today other than pain around the chest tube site.  Chest tube output recorded 300 mL last 24 hours  Assessment & Plan:   Principal Problem:   Sepsis ([place]) Active Problems:   Hypokalemia   Breast mass, right   Mastitis   Pleural effusion, right   Hemothorax on right   Palliative care encounter   Large right pleural effusion, hemothorax  -CTA chest: Large right pleural effusion which appears to have mass effect with  complete collapse/compression of the right lung and shift of mediastinal structures to the left. -Status post thoracentesis with 500 cc serosanguineous fluid removed.  Pigtail catheter in place.  Hooked up to Pleur-evac with another 3.7 L removal 11/4. -Pleural fluid exudative, likely due to suspected malignancy. Pleural fluid culture pending. Cytology pending -Repeat chest x-ray post thoracentesis: placement of right pigtail catheter with persistent complete opacification of the right hemithorax. There may be slight diminished mediastinal shift to the left. No visualized pneumothorax. -General surgery and cardiothoracic surgery comanaging chest tube  Right breast mass -CTA chest: One, possibly 2 separate large masses within the right breast extending to the skin surface with associated adjacent skin thickening and right axillary adenopathy. Findings compatible with right breast cancer. Single enlarged left axillary lymph node. -General surgery, oncology, palliative care medicine following  Acute hypoxemic respiratory failure -Secondary to pleural effusion as above -Continue nasal cannula O2 to maintain sat greater than 92%.  Remains on BiPAP  -Critical care following  Sepsis secondary to right breast cellulitis, HCAP -Sepsis present on admission with tachycardia, tachypnea, leukocytosis -Concern for cellulitis overlying breast mass with purulent discharge -Continue IV vanco, cefepime      DVT prophylaxis: SCD   Code Status: Full code Family Communication: No family at bedside; updated daughter over the phone.  She is currently in process of obtaining POA paperwork Disposition Plan:  Status is: Inpatient  Remains inpatient appropriate because:Hemodynamically unstable, Ongoing diagnostic testing needed not appropriate for outpatient work up, IV treatments appropriate due to intensity of illness or inability to take PO and Inpatient level of care appropriate due to severity of  illness   Dispo: The patient is from: Home              Anticipated d/c is to: Home              Anticipated d/c date is: > 3 days              Patient currently is not medically stable to d/c.  Remains ill with large right pleural effusion, respiratory failure.  Remains on IV antibiotics. BiPAP started last night.     Consultants:   General surgery  Palliative care medicine  Oncology  Cardiothoracic surgery  Critical care medicine  Procedures:   Thoracentesis in the ER 11/3  Antimicrobials:  Anti-infectives (From admission, onward)   Start     Dose/Rate Route Frequency Ordered Stop   06/17/20 0815  ceFEPIme (MAXIPIME) 2 g in sodium chloride 0.9 % 100 mL IVPB        2 g 200 mL/hr over 30 Minutes Intravenous Every 8 hours 06/17/20 0718     06/16/20 2200  vancomycin (VANCOCIN) IVPB 1000 mg/200 mL premix  Status:  Discontinued        1,000 mg 200 mL/hr over 60 Minutes Intravenous Every 12 hours 06/16/20 1005 06/16/20 1108   06/16/20 2200  vancomycin (VANCOCIN) IVPB 1000 mg/200 mL premix        1,000 mg 200 mL/hr over 60 Minutes Intravenous Every 12 hours 06/16/20 1123     06/16/20 0900  vancomycin (VANCOCIN) IVPB 1000 mg/200 mL premix  Status:  Discontinued        1,000 mg 200 mL/hr over 60 Minutes Intravenous Every 8 hours 06/16/20 0102 06/16/20 1005   06/16/20 0045  vancomycin (VANCOREADY) IVPB 2000 mg/400 mL        2,000 mg 200 mL/hr over 120 Minutes Intravenous  Once 06/16/20 0031 06/16/20 0307   06/16/20 0015  vancomycin (VANCOCIN) IVPB 1000 mg/200 mL premix  Status:  Discontinued        1,000 mg 200 mL/hr over 60 Minutes Intravenous  Once 06/16/20 0010 06/16/20 0031   06/16/20 0009  cefTRIAXone (ROCEPHIN) 2 g in sodium chloride 0.9 % 100 mL IVPB  Status:  Discontinued        2 g 200 mL/hr over 30 Minutes Intravenous Every 24 hours 06/16/20 0010 06/16/20 0034   06/15/20 2215  cefTRIAXone (ROCEPHIN) 2 g in sodium chloride 0.9 % 100 mL IVPB  Status:  Discontinued         2 g 200 mL/hr over 30 Minutes Intravenous Every 24 hours 06/15/20 2200 06/16/20 1108   06/15/20 2215  azithromycin (ZITHROMAX) 500 mg in sodium chloride 0.9 % 250 mL IVPB  Status:  Discontinued        500 mg 250 mL/hr over 60 Minutes Intravenous Every 24 hours 06/15/20 2200 06/16/20 1108       Objective: Vitals:   06/18/20 0500 06/18/20 0600 06/18/20 0900 06/18/20 1000  BP: 109/68 109/62 115/69 (!) 155/91  Pulse: (!) 134 (!) 108 (!) 113 (!) 117  Resp: 20 (!) 24 (!) 27 (!) 24  Temp:  99.3 F (37.4 C) (!) 97.3 F (36.3 C)   TempSrc:  Axillary Axillary   SpO2: 94% 98% 100% 90%  Weight:      Height:        Intake/Output Summary (Last 24 hours) at 06/18/2020 1103 Last data filed at 06/18/2020 1048 Gross per 24 hour  Intake 4266.29 ml  Output 750 ml  Net 3516.29 ml  Filed Weights   06/15/20 2114  Weight: 86.2 kg    Examination: General exam: Appears calm  Respiratory system: Diminished breath sounds on right.  Chest tube in place.  On BiPAP. Cardiovascular system: S1 & S2 heard, tachycardic, regular rhythm. No pedal edema. Gastrointestinal system: Abdomen is nondistended, soft and nontender. Normal bowel sounds heard. Central nervous system: Alert and oriented. Non focal exam. Speech clear  Extremities: Symmetric in appearance bilaterally  Psychiatry: Judgement and insight appear stable. Mood & affect appropriate.    Data Reviewed: I have personally reviewed following labs and imaging studies  CBC: Recent Labs  Lab 06/15/20 2125 06/15/20 2125 06/16/20 0034 06/16/20 1537 06/16/20 1636 06/17/20 0453 06/18/20 0529  WBC 15.1*  --  13.0*  --   --  18.7* 16.5*  NEUTROABS 12.3*  --   --   --   --   --   --   HGB 11.5*   < > 10.3* 14.0 14.6 14.7 10.1*  HCT 36.3   < > 33.5* 44.3 46.0 46.9* 31.6*  MCV 82.1  --  83.1  --   --  81.4 81.2  PLT 568*  --  465*  --   --  438* 368   < > = values in this interval not displayed.   Basic Metabolic Panel: Recent Labs  Lab  06/15/20 2125 06/16/20 0034 06/17/20 0453 06/18/20 0529  NA 143 140 135 134*  K 3.3* 3.3* 4.4 4.1  CL 101 100 102 99  CO2 25 27 19* 28  GLUCOSE 146* 124* 119* 106*  BUN 22* 20 17 14   CREATININE 0.61 0.56 0.75 0.66  CALCIUM 9.5 9.1 9.0 8.7*  MG 2.2  --  2.0 1.9  PHOS  --   --   --  3.2   GFR: Estimated Creatinine Clearance: 94.8 mL/min (by C-G formula based on SCr of 0.66 mg/dL). Liver Function Tests: Recent Labs  Lab 06/15/20 2125 06/16/20 0034  AST 43* 38  ALT 29 27  ALKPHOS 107 102  BILITOT 1.0 0.8  PROT 9.0* 8.8*  ALBUMIN 4.0 3.9  3.9   No results for input(s): LIPASE, AMYLASE in the last 168 hours. No results for input(s): AMMONIA in the last 168 hours. Coagulation Profile: Recent Labs  Lab 06/16/20 0034  INR 1.2   Cardiac Enzymes: No results for input(s): CKTOTAL, CKMB, CKMBINDEX, TROPONINI in the last 168 hours. BNP (last 3 results) No results for input(s): PROBNP in the last 8760 hours. HbA1C: No results for input(s): HGBA1C in the last 72 hours. CBG: No results for input(s): GLUCAP in the last 168 hours. Lipid Profile: No results for input(s): CHOL, HDL, LDLCALC, TRIG, CHOLHDL, LDLDIRECT in the last 72 hours. Thyroid Function Tests: No results for input(s): TSH, T4TOTAL, FREET4, T3FREE, THYROIDAB in the last 72 hours. Anemia Panel: No results for input(s): VITAMINB12, FOLATE, FERRITIN, TIBC, IRON, RETICCTPCT in the last 72 hours. Sepsis Labs: Recent Labs  Lab 06/15/20 2125 06/15/20 2125 06/16/20 0034 06/16/20 0514 06/17/20 1105 06/18/20 0753  PROCALCITON 24.07  --  21.52  --   --   --   LATICACIDVEN 2.8*   < > 2.5* 2.3* 2.3* 1.6   < > = values in this interval not displayed.    Recent Results (from the past 240 hour(s))  Respiratory Panel by RT PCR (Flu A&B, Covid) - Nasopharyngeal Swab     Status: None   Collection Time: 06/15/20  9:25 PM   Specimen: Nasopharyngeal Swab  Result Value Ref  Range Status   SARS Coronavirus 2 by RT PCR  NEGATIVE NEGATIVE Final    Comment: (NOTE) SARS-CoV-2 target nucleic acids are NOT DETECTED.  The SARS-CoV-2 RNA is generally detectable in upper respiratoy specimens during the acute phase of infection. The lowest concentration of SARS-CoV-2 viral copies this assay can detect is 131 copies/mL. A negative result does not preclude SARS-Cov-2 infection and should not be used as the sole basis for treatment or other patient management decisions. A negative result may occur with  improper specimen collection/handling, submission of specimen other than nasopharyngeal swab, presence of viral mutation(s) within the areas targeted by this assay, and inadequate number of viral copies (<131 copies/mL). A negative result must be combined with clinical observations, patient history, and epidemiological information. The expected result is Negative.  Fact Sheet for Patients:  PinkCheek.be  Fact Sheet for Healthcare Providers:  GravelBags.it  This test is no t yet approved or cleared by the [place] FDA and  has been authorized for detection and/or diagnosis of SARS-CoV-2 by FDA under an Emergency Use Authorization (EUA). This EUA will remain  in effect (meaning this test can be used) for the duration of the COVID-19 declaration under Section 564(b)(1) of the Act, 21 U.S.C. section 360bbb-3(b)(1), unless the authorization is terminated or revoked sooner.     Influenza A by PCR NEGATIVE NEGATIVE Final   Influenza B by PCR NEGATIVE NEGATIVE Final    Comment: (NOTE) The Xpert Xpress SARS-CoV-2/FLU/RSV assay is intended as an aid in  the diagnosis of influenza from Nasopharyngeal swab specimens and  should not be used as a sole basis for treatment. Nasal washings and  aspirates are unacceptable for Xpert Xpress SARS-CoV-2/FLU/RSV  testing.  Fact Sheet for Patients: PinkCheek.be  Fact Sheet for  Healthcare Providers: GravelBags.it  This test is not yet approved or cleared by the [place] FDA and  has been authorized for detection and/or diagnosis of SARS-CoV-2 by  FDA under an Emergency Use Authorization (EUA). This EUA will remain  in effect (meaning this test can be used) for the duration of the  Covid-19 declaration under Section 564(b)(1) of the Act, 21  U.S.C. section 360bbb-3(b)(1), unless the authorization is  terminated or revoked. Performed at [facility], [address] [place], [place] [address]   Blood Culture (routine x 2)     Status: None (Preliminary result)   Collection Time: 06/15/20 10:09 PM   Specimen: BLOOD  Result Value Ref Range Status   Specimen Description BLOOD LEFT AC  Final   Special Requests   Final    BOTTLES DRAWN AEROBIC AND ANAEROBIC Blood Culture results may not be optimal due to an inadequate volume of blood received in culture bottles   Culture   Final    NO GROWTH 3 DAYS Performed at [facility], [address], [place], [place] [address]    Report Status PENDING  Incomplete  Blood Culture (routine x 2)     Status: None (Preliminary result)   Collection Time: 06/15/20 10:11 PM   Specimen: BLOOD  Result Value Ref Range Status   Specimen Description BLOOD RIGHT [facility]  Final   Special Requests   Final    BOTTLES DRAWN AEROBIC AND ANAEROBIC Blood Culture adequate volume   Culture   Final    NO GROWTH 3 DAYS Performed at [facility], [address], [place], [place] [address]    Report Status PENDING  Incomplete  Urine culture     Status:  None   Collection Time: 06/15/20 11:40 PM   Specimen: Urine, Random  Result Value Ref Range Status   Specimen Description   Final    URINE, RANDOM Performed at [facility] [place], [address], [place], [place] [address]    Special Requests   Final    NONE Performed at [facility], [address], [place],  [place] [address]    Culture   Final    NO GROWTH Performed at [facility], [place] [address] [place], [place] [address]    Report Status 06/17/2020 FINAL  Final  Pleural Fluid culture (includes gram stain)     Status: None (Preliminary result)   Collection Time: 06/15/20 11:40 PM   Specimen: Pleural Fluid  Result Value Ref Range Status   Specimen Description   Final    PLEURAL Performed at [facility], [address], [place], [place] [address]    Special Requests   Final    NONE Performed at [facility], [place], [place] [address]    Gram Stain NO WBC SEEN NO ORGANISMS SEEN   Final   Culture   Final    NO GROWTH 2 DAYS Performed at [facility], [place] [address] [place], [place] [address]    Report Status PENDING  Incomplete  MRSA PCR Screening     Status: None   Collection Time: 06/16/20 11:51 PM   Specimen: Nasopharyngeal  Result Value Ref Range Status   MRSA by PCR NEGATIVE NEGATIVE Final    Comment:        The GeneXpert MRSA Assay (FDA approved for NASAL specimens only), is one component of a comprehensive MRSA colonization surveillance program. It is not intended to diagnose MRSA infection nor to guide or monitor treatment for MRSA infections. Performed at [facility], [address] [place], [place] [address]       Radiology Studies: DG Chest 1 View  Result Date: 06/16/2020 CLINICAL DATA:  Hemothorax on the right. EXAM: CHEST  1 VIEW COMPARISON:  Same day chest radiograph. FINDINGS: A right-sided pleural pigtail catheter is redemonstrated. There is interval improvement in aeration of the right lung with decreased size of a right hemothorax. A small right pneumothorax is not significantly changed in size. Severe right atelectasis/airspace disease is redemonstrated. Mild interstitial opacities in the left lung appear unchanged. There is no left pleural effusion or left pneumothorax. The cardiac silhouette is  partially obscured but appears unchanged. IMPRESSION: 1. Interval improvement in aeration of the right lung with decreased size of a right hemothorax. 2. Unchanged small right pneumothorax with pleural pigtail catheter in place. 3. Severe right atelectasis/airspace disease. Electronically Signed   By: Zerita Boers M.D.   On: 06/16/2020 14:52   DG Chest 1 View  Result Date: 06/16/2020 CLINICAL DATA:  Cough and phlegm production. EXAM: CHEST  1 VIEW COMPARISON:  Chest radiograph dated 06/15/2020. FINDINGS: The cardiac silhouette is obscured. A right-sided pleural pigtail catheter is in unchanged location and there has been interval decrease in a large right pleural fluid collection (effusion versus hemothorax). There is no longer mediastinal shift to the left. There is a small right pneumothorax. Severe airspace and interstitial opacities are seen on the right. Mild interstitial opacities are seen on the left. There is no left pleural effusion or pneumothorax. The osseous structures are intact. IMPRESSION: 1. Small right pneumothorax with a right-sided pleural pigtail catheter in place. 2. Interval decrease in a large right  pleural fluid collection (effusion versus hemothorax). 3. Severe airspace and interstitial opacities on the right. Mild interstitial opacities in the left. Electronically Signed   By: Zerita Boers M.D.   On: 06/16/2020 14:28   DG Chest Port 1 View  Result Date: 06/17/2020 CLINICAL DATA:  Chest x-ray 06/17/2020 5 a.m. EXAM: PORTABLE CHEST 1 VIEW COMPARISON:  Chest x-ray 06/17/2020 4:10 a.m. FINDINGS: Interval increase in size of a likely large right pleural effusion with slight aeration of the right lung. Likely trace pleural effusion on the left. Interval increase in airspace interstitial opacities of the left lung that is most prominent in the left mid lung zone. Right pigtail chest tube overlies the right hemithorax. IMPRESSION: 1. Slightly improved almost complete whiteout of the right  chest with slightly decreased in size large pleural effusion. 2. Interval increase in interstitial and alveolar left lung opacities with possible trace left pleural effusion. Electronically Signed   By: Iven Finn M.D.   On: 06/17/2020 21:30   DG Chest Port 1 View  Result Date: 06/17/2020 CLINICAL DATA:  Pleural effusion EXAM: PORTABLE CHEST 1 VIEW COMPARISON:  Yesterday FINDINGS: More dense and homogeneous right sided opacity. Right pleural catheter in stable position. Left lower lobe opacity which also appears increased. Likely no cardiomegaly when allowing for mediastinal shift. IMPRESSION: White out of the right chest with mediastinal mass effect from large pleural effusion. Retrocardiac infiltrate that could be infection or atelectasis. Electronically Signed   By: Monte Fantasia M.D.   On: 06/17/2020 05:00      Scheduled Meds: . Chlorhexidine Gluconate Cloth  6 each Topical Q0600  . melatonin  5 mg Oral QHS  . sodium chloride flush  10-40 mL Intracatheter Q12H   Continuous Infusions: . ceFEPime (MAXIPIME) IV Stopped (06/18/20 0755)  . lactated ringers 125 mL/hr at 06/18/20 1024  . vancomycin 200 mL/hr at 06/18/20 1024     LOS: 3 days      Time spent: 40 minutes   Dessa Phi, DO [facility] 06/18/2020, 11:03 AM   Available via Epic secure chat 7am-7pm After these hours, please refer to coverage provider listed on [email or web address]

## 2020-06-19 ENCOUNTER — Inpatient Hospital Stay: Payer: Medicaid Other

## 2020-06-19 DIAGNOSIS — N6310 Unspecified lump in the right breast, unspecified quadrant: Secondary | ICD-10-CM

## 2020-06-19 DIAGNOSIS — D72829 Elevated white blood cell count, unspecified: Secondary | ICD-10-CM

## 2020-06-19 DIAGNOSIS — Z7189 Other specified counseling: Secondary | ICD-10-CM

## 2020-06-19 DIAGNOSIS — N61 Mastitis without abscess: Secondary | ICD-10-CM

## 2020-06-19 DIAGNOSIS — J942 Hemothorax: Secondary | ICD-10-CM

## 2020-06-19 DIAGNOSIS — Z171 Estrogen receptor negative status [ER-]: Secondary | ICD-10-CM

## 2020-06-19 DIAGNOSIS — Z9689 Presence of other specified functional implants: Secondary | ICD-10-CM

## 2020-06-19 DIAGNOSIS — J9601 Acute respiratory failure with hypoxia: Secondary | ICD-10-CM

## 2020-06-19 LAB — BASIC METABOLIC PANEL
Anion gap: 5 (ref 5–15)
BUN: 11 mg/dL (ref 6–20)
CO2: 30 mmol/L (ref 22–32)
Calcium: 8.5 mg/dL — ABNORMAL LOW (ref 8.9–10.3)
Chloride: 101 mmol/L (ref 98–111)
Creatinine, Ser: 0.48 mg/dL (ref 0.44–1.00)
GFR, Estimated: >60 mL/min (ref >60)
Glucose, Bld: 102 mg/dL — ABNORMAL HIGH (ref 70–99)
Potassium: 4.1 mmol/L (ref 3.5–5.1)
Sodium: 136 mmol/L (ref 135–145)

## 2020-06-19 LAB — CBC
HCT: 35.9 % — ABNORMAL LOW (ref 36.0–46.0)
Hemoglobin: 11.2 g/dL — ABNORMAL LOW (ref 12.0–15.0)
MCH: 25.9 pg — ABNORMAL LOW (ref 26.0–34.0)
MCHC: 31.2 g/dL (ref 30.0–36.0)
MCV: 83.1 fL (ref 80.0–100.0)
Platelets: 358 10*3/uL (ref 150–400)
RBC: 4.32 MIL/uL (ref 3.87–5.11)
RDW: 12.8 % (ref 11.5–15.5)
WBC: 15.9 10*3/uL — ABNORMAL HIGH (ref 4.0–10.5)
nRBC: 0 % (ref 0.0–0.2)

## 2020-06-19 LAB — BODY FLUID CULTURE
Culture: NO GROWTH
Gram Stain: NONE SEEN

## 2020-06-19 LAB — CD19 AND CD20, FLOW CYTOMETRY

## 2020-06-19 LAB — LEGIONELLA PNEUMOPHILA SEROGP 1 UR AG: L. pneumophila Serogp 1 Ur Ag: NEGATIVE

## 2020-06-19 IMAGING — DX DG CHEST 1V PORT
1 series · 1 of 1 positions shown · non-contrast
Comparison: Chest radiograph from one day prior.

CLINICAL DATA: Chest tube in place

EXAM:
PORTABLE CHEST 1 VIEW

[chest ap]
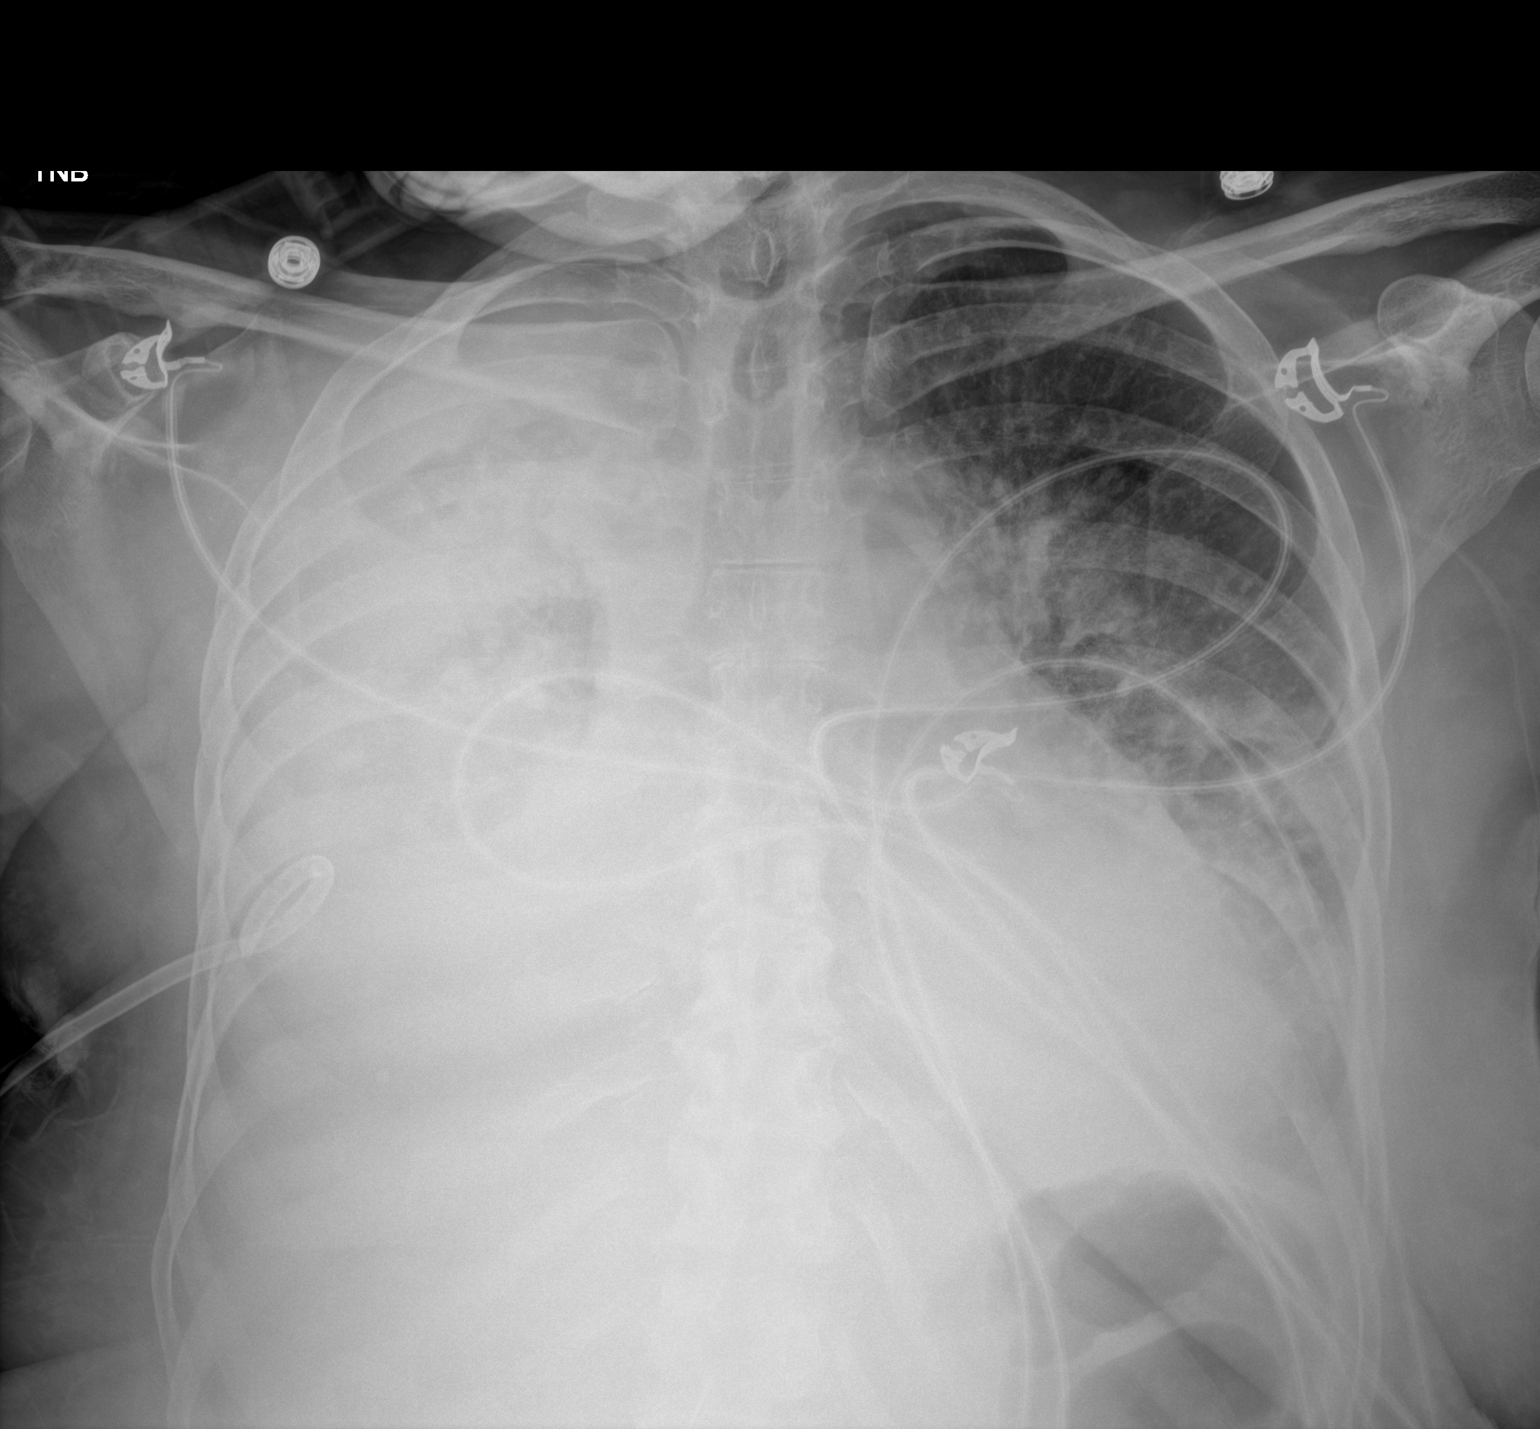

[1 of 1 positions shown; findings below may reference images not displayed]

FINDINGS: Right basilar pigtail chest tube is stable. Stable cardiomediastinal
silhouette with mild cardiomegaly. No pneumothorax. Near complete
opacification of the right hemithorax with minimally improved
central right lung aeration. Extensive fluffy left parahilar lung
opacities, slightly worsened. Left retrocardiac consolidation is
stable.
IMPRESSION: 1. Stable right basilar chest tube without pneumothorax.
2. Near complete opacification of the right hemithorax with
minimally improved central right lung aeration, which could be due
to any combination of pleural effusion, atelectasis, pneumonia,
edema or mass.
3. Stable left retrocardiac consolidation, favor atelectasis.
4. Fluffy left parahilar lung opacities, slightly worsened, favor
pulmonary edema.

## 2020-06-19 MED ORDER — LINEZOLID 600 MG/300ML IV SOLN
600.0000 mg | Freq: Two times a day (BID) | INTRAVENOUS | Status: DC
  Administered 2020-06-20 – 2020-06-23 (×9): 600 mg via INTRAVENOUS
  Filled 2020-06-19 (×10): qty 300

## 2020-06-19 MED ORDER — ENOXAPARIN SODIUM 40 MG/0.4ML ~~LOC~~ SOLN
40.0000 mg | Freq: Once | SUBCUTANEOUS | Status: AC
  Administered 2020-06-19: 40 mg via SUBCUTANEOUS
  Filled 2020-06-19: qty 0.4

## 2020-06-19 NOTE — Progress Notes (Signed)
CC: Metastatic breast cancer with malignant pleural effusion Subjective: She feels better today. She was off BiPAP and having breakfast.  Objective: Vital signs in last 24 hours: Temp:  [97.5 F (36.4 C)-97.8 F (36.6 C)] 97.8 F (36.6 C) (11/07 1600) Pulse Rate:  [113-136] 124 (11/07 1636) Resp:  [13-54] 33 (11/07 1636) BP: (97-156)/(60-99) 119/81 (11/07 1600) SpO2:  [74 %-97 %] 88 % (11/07 1636) FiO2 (%):  [70 %-100 %] 90 % (11/07 1600) Last BM Date: 06/15/20  Intake/Output from previous day: 11/06 0701 - 11/07 0700 In: 2125.6 [I.V.:1455.3; IV Piggyback:670.3] Out: 525 [Urine:350; Chest Tube:175] Intake/Output this shift: Total I/O In: 360 [I.V.:60; IV Piggyback:300] Out: 120 [Chest Tube:120]  Physical exam:  Debilitated with obvious dyspnea and use of accessory muscles Chest no BSD on the right, CT in place, no leak, serous Right chest wall/breast mass fixed  Lab Results: CBC  Recent Labs    06/18/20 0529 06/19/20 0450  WBC 16.5* 15.9*  HGB 10.1* 11.2*  HCT 31.6* 35.9*  PLT 368 358   BMET Recent Labs    06/18/20 0529 06/19/20 0450  NA 134* 136  K 4.1 4.1  CL 99 101  CO2 28 30  GLUCOSE 106* 102*  BUN 14 11  CREATININE 0.66 0.48  CALCIUM 8.7* 8.5*   PT/INR No results for input(s): LABPROT, INR in the last 72 hours. ABG No results for input(s): PHART, HCO3 in the last 72 hours.  Invalid input(s): PCO2, PO2  Studies/Results: DG Chest Port 1 View  Result Date: 06/19/2020 CLINICAL DATA:  Chest tube in place EXAM: PORTABLE CHEST 1 VIEW COMPARISON:  Chest radiograph from one day prior. FINDINGS: Right basilar pigtail chest tube is stable. Stable cardiomediastinal silhouette with mild cardiomegaly. No pneumothorax. Near complete opacification of the right hemithorax with minimally improved central right lung aeration. Extensive fluffy left parahilar lung opacities, slightly worsened. Left retrocardiac consolidation is stable. IMPRESSION: 1. Stable right  basilar chest tube without pneumothorax. 2. Near complete opacification of the right hemithorax with minimally improved central right lung aeration, which could be due to any combination of pleural effusion, atelectasis, pneumonia, edema or mass. 3. Stable left retrocardiac consolidation, favor atelectasis. 4. Fluffy left parahilar lung opacities, slightly worsened, favor pulmonary edema. Electronically Signed   By: Ilona Sorrel M.D.   On: 06/19/2020 15:04   DG CHEST PORT 1 VIEW  Result Date: 06/18/2020 CLINICAL DATA:  Malignant pleural effusion, RIGHT-side chest tube EXAM: PORTABLE CHEST 1 VIEW COMPARISON:  Portable exam 1042 hours compared to 11 x 21 FINDINGS: Pigtail RIGHT thoracostomy tube again seen. Persistent subtotal opacification of the RIGHT hemithorax by effusion and atelectasis, underlying tumor not excluded. Atelectasis versus consolidation of LEFT lower lobe with probable small pleural effusion. Stable heart size. No acute osseous findings. IMPRESSION: Persistent subtotal opacification of the RIGHT hemithorax by effusion and atelectasis, underlying tumor not excluded. Persistent infiltrate and probable pleural effusion at inferior LEFT chest. Electronically Signed   By: Lavonia Dana M.D.   On: 06/18/2020 16:09   DG Chest Port 1 View  Result Date: 06/17/2020 CLINICAL DATA:  Chest x-ray 06/17/2020 5 a.m. EXAM: PORTABLE CHEST 1 VIEW COMPARISON:  Chest x-ray 06/17/2020 4:10 a.m. FINDINGS: Interval increase in size of a likely large right pleural effusion with slight aeration of the right lung. Likely trace pleural effusion on the left. Interval increase in airspace interstitial opacities of the left lung that is most prominent in the left mid lung zone. Right pigtail chest tube overlies the right  hemithorax. IMPRESSION: 1. Slightly improved almost complete whiteout of the right chest with slightly decreased in size large pleural effusion. 2. Interval increase in interstitial and alveolar left lung  opacities with possible trace left pleural effusion. Electronically Signed   By: Iven Finn M.D.   On: 06/17/2020 21:30    Anti-infectives: Anti-infectives (From admission, onward)   Start     Dose/Rate Route Frequency Ordered Stop   06/19/20 2200  linezolid (ZYVOX) IVPB 600 mg        600 mg 300 mL/hr over 60 Minutes Intravenous Every 12 hours 06/19/20 1026     06/17/20 0815  ceFEPIme (MAXIPIME) 2 g in sodium chloride 0.9 % 100 mL IVPB        2 g 200 mL/hr over 30 Minutes Intravenous Every 8 hours 06/17/20 0718     06/16/20 2200  vancomycin (VANCOCIN) IVPB 1000 mg/200 mL premix  Status:  Discontinued        1,000 mg 200 mL/hr over 60 Minutes Intravenous Every 12 hours 06/16/20 1005 06/16/20 1108   06/16/20 2200  vancomycin (VANCOCIN) IVPB 1000 mg/200 mL premix        1,000 mg 200 mL/hr over 60 Minutes Intravenous Every 12 hours 06/16/20 1123 06/19/20 1029   06/16/20 0900  vancomycin (VANCOCIN) IVPB 1000 mg/200 mL premix  Status:  Discontinued        1,000 mg 200 mL/hr over 60 Minutes Intravenous Every 8 hours 06/16/20 0102 06/16/20 1005   06/16/20 0045  vancomycin (VANCOREADY) IVPB 2000 mg/400 mL        2,000 mg 200 mL/hr over 120 Minutes Intravenous  Once 06/16/20 0031 06/16/20 0307   06/16/20 0015  vancomycin (VANCOCIN) IVPB 1000 mg/200 mL premix  Status:  Discontinued        1,000 mg 200 mL/hr over 60 Minutes Intravenous  Once 06/16/20 0010 06/16/20 0031   06/16/20 0009  cefTRIAXone (ROCEPHIN) 2 g in sodium chloride 0.9 % 100 mL IVPB  Status:  Discontinued        2 g 200 mL/hr over 30 Minutes Intravenous Every 24 hours 06/16/20 0010 06/16/20 0034   06/15/20 2215  cefTRIAXone (ROCEPHIN) 2 g in sodium chloride 0.9 % 100 mL IVPB  Status:  Discontinued        2 g 200 mL/hr over 30 Minutes Intravenous Every 24 hours 06/15/20 2200 06/16/20 1108   06/15/20 2215  azithromycin (ZITHROMAX) 500 mg in sodium chloride 0.9 % 250 mL IVPB  Status:  Discontinued        500 mg 250 mL/hr over  60 Minutes Intravenous Every 24 hours 06/15/20 2200 06/16/20 1108      Assessment/Plan:  Metastatic breast cancer with malignant pleural effusion. 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 We will consult with Dr. Genevive Bi regarding potential for palliative right VATS and Pleurx catheter. Family is thinking about goals of care. This is a very unfortunate situation without any good solution. I spent at least 25 minutes in this encounter greater than 50% spent in coordination /counseling of her care  Caroleen Hamman, MD, [facility]  06/19/2020

## 2020-06-19 NOTE — Progress Notes (Signed)
NAME:  Jessica Willis, MRN:  [id number], DOB:  [birth year], LOS: 4 ADMISSION DATE:  06/15/2020, CONSULTATION DATE:  06/16/2020 REFERRING MD:  Dr. Maylene Roes, CHIEF COMPLAINT: Shortness of Breath, cough   Brief History    Jessica Willis is a 54 y.o. Female with a past medical history of anemia, adnd R Pleural empyema strep pyogenes in 2016 who presented to [facility] ED on 06/15/20 due to a 1 week history of progressive shortness of breath and cough.  She also reported bone swelling of the right breast for at least 6 months, but did not seek any medical attention.  Upon presentation she was noted to have a large right breast mass with a duplicate mass above the breast which looked necrotic with bullae and purulent discharge.  ED Course: Temperature 98.2 blood pressure 180/107 pulse 144 respirate 38 oxygen sat 94% on room air.  White count 15.1 hemoglobin 11.4 and platelets count of 565.  Sodium is 143 potassium 3.3 chloride 101 CO2 25 BUN 22 and creatinine was 0.61.  Lactic acid 2.8 glucose 146 and calcium 9.5.  CT angiogram of the chest shows no evidence of PE.  There was one possibily 2 separate large mass seen within the right breast extending to the skin surface was associated skin thickening or right axillary adenopathy.  Findings are consistent with right breast cancer.  Is also a large right pleural effusion with mass-effect with complete collapse of the right lung and shift of mediastinal structures the left.  There is a single enlarged left axillary lymph node.  Thoracentesis was performed with insertion of pigtail catheter in the ER, which yielded 500 cc of serosanguineous fluid removed. cytolog sent 06/15/20  She was admitted by the Hospitalist for further workup and treatment of  Acute Hypoxic Respiratory Failure in the setting of Large Right Pleural Effusion (suspected malignant pleural effusion) due to Right Breast Mass and Sepsis in the setting of Right Breast Cellulitis.  On 06/16/20 she  became tachycardic with RR in the 20's and worsening hypoxia.  Follow-up chest x-ray showed persistent complete opacification of the right hemithorax, along with slight mediastinal shift to the left.  General Surgery was consulted to evaluate need for larger chest tube placement. General Surgery placed pigtail to suction, which drained 3.5L dark Serosanguinous fluid (did not appear like fresh blood).  Cardiothoracic Surgery was consulted to ensue no concern for active bleeding or need for possible surgical intervention.  Follow up chest x-rays showed that the pleural effusion was improving, and output has slowed down considerably.  Given her high risk of decompensation, she was changed to Stepdown status with PCCM consultation.  Oncology and Palliative Care have also been consulted.   Past Medical History  Anemia Consults:  Hospitalist (Primary Service) Cardiothoracic Surgery General Surgery Oncology Palliative Care PCCM  Procedures:  11/3: Right Pigtail Catheter inserted  Significant Diagnostic Tests:  11/3: CTA Chest>>No evidence of pulmonary embolus. One, possibly 2 separate large masses within the right breast extending to the skin surface with associated adjacent skin thickening and right axillary adenopathy. Findings compatible with right breast cancer. Large right pleural effusion which appears to have mass effect with complete collapse/compression of the right lung and shift of mediastinal structures to the left. Single enlarged left axillary lymph node. 11/3: CXR>>Placement of right pigtail catheter with persistent complete opacification of the right hemithorax. There may be slight diminished mediastinal shift to the left. No visualized pneumothorax. 11/4: Venous US Left LE>>No evidence of deep venous thrombosis. 11/4: CXR>>1. Small  right pneumothorax with a right-sided pleural pigtail catheter in place. 2. Interval decrease in a large right pleural fluid  collection (effusion versus hemothorax). 3. Severe airspace and interstitial opacities on the right. Mild interstitial opacities in the left. 11/4: CXR>>1. Interval improvement in aeration of the right lung with decreased size of a right hemothorax. 2. Unchanged small right pneumothorax with pleural pigtail catheter in place. 3. Severe right atelectasis/airspace disease.  Micro Data:  11/3: SARS-CoV-2 PCR>> negative 11/3: Influenza PCR>> negative 11/3: HIV Screen>> non-reactive 11/3: Blood culture x2>> 11/3: Urine culture>> 11/3: Pleural Fluid Culture>>  Antimicrobials:  Azithromycin 11/4>>11/4 Ceftriaxone 11/3>>11/4 Vancomycin 11/4>>   Significant Hospital Events   11/3: Presented to ED, to be admitted by Hospitalist 11/5 - Currently on 8L [place] Hemodynamically stable on pressors; Afebrile Currently denies chest pain, SOB, abdominal pain, N/V/D, fever/chills  11/6 - n now on BiPAP.  Continue respiratory failure.  Continued right-sided whiteout.  The pigtail catheter drains but although it is slow and intermittently has been blocked in the past.    Interim history/subjective:   06/19/2020 -continues to need BiPAP at night.  Off BiPAP she looks better but is hypoxemic on 15 L nasal cannula with pulse ox of 85% - 79%.  But is able to eat and looks a little bit less distress.  Large bore chest tube by CCM held off yesterday because of tenuous respiratory status and also significant tumor infiltration in the right chest wall.  She still is full code.  General surgery prefers thoracic surgery evaluation for possible VATS and large bowel tube thoracostomy.  We all agreed that this would be of palliative intent only -if the patient and family still want to be full code \  Per the hospitalist Dr. Maylene Roes: Daughter is going to talk to the rest of the family about goals of care.  This morning patient reiterated to Dr. Maylene Roes that patient is full code.  Objective   Blood pressure 114/68, pulse (!)  128, temperature 97.8 F (36.6 C), temperature source Axillary, resp. rate (!) 24, height 5\' 10"  (1.778 m), weight 86.2 kg, last menstrual period 06/16/2015, SpO2 95 %.    FiO2 (%):  [70 %-100 %] 100 %   Intake/Output Summary (Last 24 hours) at 06/19/2020 1106 Last data filed at 06/19/2020 0800 Gross per 24 hour  Intake 1559.87 ml  Output 175 ml  Net 1384.87 ml   Filed Weights   06/15/20 2114  Weight: 86.2 kg    Examination: Frail lady.  Yesterday on physical examination of the chest the significant tumor infiltration from the right breast with necrotic fungating mass.  The entire right lateral chest wall was infiltrated by thick tumor.  Currently she is alert.  She is on nasal oxygen mildly tachypneic.  She is able to eat.  She is fatigued.  She is tearful.  Alert and oriented x3 abdomen soft tachycardic.  Looks emaciated   LABS    PULMONARY Recent Labs  Lab 06/15/20 2125  HCO3 28.2*  O2SAT 67.2    CBC Recent Labs  Lab 06/17/20 0453 06/18/20 0529 06/19/20 0450  HGB 14.7 10.1* 11.2*  HCT 46.9* 31.6* 35.9*  WBC 18.7* 16.5* 15.9*  PLT 438* 368 358    COAGULATION Recent Labs  Lab 06/16/20 0034  INR 1.2    CARDIAC  No results for input(s): TROPONINI in the last 168 hours. No results for input(s): PROBNP in the last 168 hours.   CHEMISTRY Recent Labs  Lab 06/15/20 2125 06/15/20 2125  06/16/20 0034 06/16/20 0034 06/17/20 0453 06/17/20 0453 06/18/20 0529 06/19/20 0450  NA 143  --  140  --  135  --  134* 136  K 3.3*   < > 3.3*   < > 4.4   < > 4.1 4.1  CL 101  --  100  --  102  --  99 101  CO2 25  --  27  --  19*  --  28 30  GLUCOSE 146*  --  124*  --  119*  --  106* 102*  BUN 22*  --  20  --  17  --  14 11  CREATININE 0.61  --  0.56  --  0.75  --  0.66 0.48  CALCIUM 9.5  --  9.1  --  9.0  --  8.7* 8.5*  MG 2.2  --   --   --  2.0  --  1.9  --   PHOS  --   --   --   --   --   --  3.2  --    < > = values in this interval not displayed.   Estimated  Creatinine Clearance: 94.8 mL/min (by C-G formula based on SCr of 0.48 mg/dL).   LIVER Recent Labs  Lab 06/15/20 2125 06/16/20 0034  AST 43* 38  ALT 29 27  ALKPHOS 107 102  BILITOT 1.0 0.8  PROT 9.0* 8.8*  ALBUMIN 4.0 3.9  3.9  INR  --  1.2     INFECTIOUS Recent Labs  Lab 06/15/20 2125 06/15/20 2125 06/16/20 0034 06/16/20 0034 06/16/20 0514 06/17/20 1105 06/18/20 0753  LATICACIDVEN 2.8*   < > 2.5*   < > 2.3* 2.3* 1.6  PROCALCITON 24.07  --  21.52  --   --   --   --    < > = values in this interval not displayed.     ENDOCRINE CBG (last 3)  No results for input(s): GLUCAP in the last 72 hours.       IMAGING x48h  - image(s) personally visualized  -   highlighted in bold DG CHEST PORT 1 VIEW  Result Date: 06/18/2020 CLINICAL DATA:  Malignant pleural effusion, RIGHT-side chest tube EXAM: PORTABLE CHEST 1 VIEW COMPARISON:  Portable exam 1042 hours compared to 11 x 21 FINDINGS: Pigtail RIGHT thoracostomy tube again seen. Persistent subtotal opacification of the RIGHT hemithorax by effusion and atelectasis, underlying tumor not excluded. Atelectasis versus consolidation of LEFT lower lobe with probable small pleural effusion. Stable heart size. No acute osseous findings. IMPRESSION: Persistent subtotal opacification of the RIGHT hemithorax by effusion and atelectasis, underlying tumor not excluded. Persistent infiltrate and probable pleural effusion at inferior LEFT chest. Electronically Signed   By: Lavonia Dana M.D.   On: 06/18/2020 16:09   DG Chest Port 1 View  Result Date: 06/17/2020 CLINICAL DATA:  Chest x-ray 06/17/2020 5 a.m. EXAM: PORTABLE CHEST 1 VIEW COMPARISON:  Chest x-ray 06/17/2020 4:10 a.m. FINDINGS: Interval increase in size of a likely large right pleural effusion with slight aeration of the right lung. Likely trace pleural effusion on the left. Interval increase in airspace interstitial opacities of the left lung that is most prominent in the left mid  lung zone. Right pigtail chest tube overlies the right hemithorax. IMPRESSION: 1. Slightly improved almost complete whiteout of the right chest with slightly decreased in size large pleural effusion. 2. Interval increase in interstitial and alveolar left lung opacities with possible trace left pleural  effusion. Electronically Signed   By: Iven Finn M.D.   On: 06/17/2020 21:30     Resolved Hospital Problem list   N/A  Assessment & Plan:   Acute Hypoxic Respiratory Failure in the setting of large Right Pleural Effusion (suspected malignant pleural effusion) -Initial concern for possible Hemithorax, fluid from pigtail catheter with Serosanguinous (didn't appear like fresh blood) drainage which has slowed considerably, H&H has remained stable ~ thus suspicion is for Malignant Pleural Effusion   06/19/2020 -the right pigtail is working so-so.  She has significant right exudative pleural effusion [likely malignant] with complete whiteout and possible trapped lung causing respiratory distress.  She is in need of large bore tube thoracostomy with or without VATS.  Has significant tumor infiltration of the right chest wall -probably more appropriate for chest to be done by thoracic surgery or under intubation  Plan - bipap as needed and at night -Change regular oxygen to heated high flow nasal cannula [subjectively she appears better on nasal oxygen as opposed to BiPAP] -Await the return of thoracic surgery 06/20/2020 - intubte If worse -> mechanically ventilate -> then CCM can place large bore chest tube   Right Breast Mass -Oncology following, appreciate input -Cytology pending   Sepsis secondary to Right Breast Cellulitis Per [facility] and ccs    Best practice:  Diet: Regular Diet Pain/Anxiety/Delirium protocol (if indicated): Prn Morphine VAP protocol (if indicated): N/A DVT prophylaxis: Lovenox GI prophylaxis: N/A Glucose control: N/A  Mobility: As tolerated  Code Status: full  code per Dr Maylene Roes primary -06/19/2020  Family Communication:    - Updated pt at bedside 06/18/20. Called son [name] [phone] to daughter [phone]. Infrmed her of curren status. Also, informed that my understanding is breast cancer is advanced and hospice appropriate as relayed to me bysurgery.  Requested she consider comfort care and address goals of care. sHe said she needs 1h to be at bedside and address issues with patient/mom  Disposition: Stepdown       ATTESTATION & SIGNATURE   The patient Jessica Willis is critically ill with multiple organ systems failure and requires high complexity decision making for assessment and support, frequent evaluation and titration of therapies, application of advanced monitoring technologies and extensive interpretation of multiple databases.   Critical Care Time devoted to patient care services described in this note is  35  Minutes. This time reflects time of care of this signee Dr Brand Males. This critical care time does not reflect procedure time, or teaching time or supervisory time of PA/NP/Med student/Med Resident etc but could involve care discussion time     Dr. Brand Males, M.D., [facility].C.P Pulmonary and Critical Care Medicine Staff Physician [place] [facility] Pager: [phone], If no answer or between  15:00h - 7:00h: call [phone]  06/19/2020 11:24 AM

## 2020-06-19 NOTE — Progress Notes (Signed)
Ch visited with Pt and Pt's youngest son to follow-up on AD completion. Pt says that her daughter will come in tomorrow and they will request notarization. Ch will let other chaplains know to follow-up.

## 2020-06-19 NOTE — Progress Notes (Addendum)
PROGRESS NOTE    Jessica Willis  OXB:[id number] DOB: 1965/09/05 DOA: 06/15/2020 PCP: System, Provider Not In     Brief Narrative:  Jessica Willis is a 54 year old female with past medical history significant for anemia who presents with chief complaint of shortness of breath and cough.  She states that she has had some shortness of breath, especially worsening in the past week.  She also admits to having a large right breast mass that started about 6 months ago.  She has not sought any medical care for this.  In the emergency department, imaging revealed two separate right-sided breast masses, right axillary adenopathy, enlarged left axillary lymph node, large right-sided pleural effusion with mass-effect and left mediastinal shift, near collapse of the right lung.  She underwent thoracentesis with pigtail catheter placement in the emergency department.  Repeat chest x-ray revealed persistent right-sided pleural effusion.  She was started on IV antibiotics.  General surgery was consulted and additional 3.7 L of serosanguineous fluid was removed from her pleural space.  Cardiothoracic surgery was consulted to comanage the chest tube.  Oncology as well as palliative care were consulted.  Due to continued tachycardia and worsening respiratory status, patient was transferred to stepdown unit with ICU consult.  New events last 24 hours / Subjective: Remains on BiPAP.  No new concerns or complaints voiced by the patient.  No family at bedside.  I did discuss with patient her poor prognosis, likely requiring intubation and OR for VATS, large bore chest tube.  I also discussed with her our concern for her respiratory status as well as right-sided breast mass.  I discussed with her her CODE STATUS including aggressive and heroic measures, mechanical ventilation, chest compression, defibrillation to resuscitate her in case of decompensation versus focusing on comfort.  At this time, patient was in  agreement with FULL CODE despite given her poor prognosis overall.  I also discussed this with her daughter over the phone 11/6. Unfortunately palliative care service is not available at [facility] over the weekend.   Addendum: I also spoke with daughter over the phone this afternoon and she stated that after her conversation with patient yesterday, patient remains full code and aggressive measures including surgery. Daughter plans to bring POA paperwork tomorrow to get it notarized.   Assessment & Plan:   Principal Problem:   Sepsis ([place]) Active Problems:   Hypokalemia   Breast mass, right   Mastitis   Pleural effusion, right   Hemothorax on right   Palliative care encounter   Large right pleural effusion, hemothorax  -CTA chest: Large right pleural effusion which appears to have mass effect with complete collapse/compression of the right lung and shift of mediastinal structures to the left. -Status post thoracentesis with 500 cc serosanguineous fluid removed.  Pigtail catheter in place.  Hooked up to Pleur-evac with another 3.7 L removal 11/4. -Pleural fluid exudative, likely due to suspected malignancy. Pleural fluid culture negative. Cytology pending -Repeat chest x-ray post thoracentesis: placement of right pigtail catheter with persistent complete opacification of the right hemithorax. There may be slight diminished mediastinal shift to the left. No visualized pneumothorax. -General surgery and cardiothoracic surgery comanaging chest tube. May need VATS and larger chest tube if aggressive measures pursued.   Right breast mass -CTA chest: One, possibly 2 separate large masses within the right breast extending to the skin surface with associated adjacent skin thickening and right axillary adenopathy. Findings compatible with right breast cancer. Single enlarged left axillary lymph node. -Cytology  pending from thoracentesis -General surgery, oncology, palliative care medicine  following  Acute hypoxemic respiratory failure -Secondary to pleural effusion as above -Continue nasal cannula O2 to maintain sat greater than 92%.  Remains on BiPAP  -Critical care following, high risk of decompensation   Sepsis secondary to right breast cellulitis, HCAP -Sepsis present on admission with tachycardia, tachypnea, leukocytosis -Concern for cellulitis overlying breast mass with purulent discharge -Continue IV vanco, cefepime      DVT prophylaxis: SCD Code Status: Full code Family Communication: No family at bedside; updated daughter over the phone this afternoon Disposition Plan:  Status is: Inpatient  Remains inpatient appropriate because:Hemodynamically unstable, Ongoing diagnostic testing needed not appropriate for outpatient work up, IV treatments appropriate due to intensity of illness or inability to take PO and Inpatient level of care appropriate due to severity of illness   Dispo: The patient is from: Home              Anticipated d/c is to: TBD              Anticipated d/c date is: > 3 days              Patient currently is not medically stable to d/c.  Remains ill with large right pleural effusion, respiratory failure.  Remains on IV antibiotics. BiPAP.     Consultants:   General surgery  Palliative care medicine  Oncology  Cardiothoracic surgery  Critical care medicine  Procedures:   Thoracentesis in the ER 11/3  Antimicrobials:  Anti-infectives (From admission, onward)   Start     Dose/Rate Route Frequency Ordered Stop   06/17/20 0815  ceFEPIme (MAXIPIME) 2 g in sodium chloride 0.9 % 100 mL IVPB        2 g 200 mL/hr over 30 Minutes Intravenous Every 8 hours 06/17/20 0718     06/16/20 2200  vancomycin (VANCOCIN) IVPB 1000 mg/200 mL premix  Status:  Discontinued        1,000 mg 200 mL/hr over 60 Minutes Intravenous Every 12 hours 06/16/20 1005 06/16/20 1108   06/16/20 2200  vancomycin (VANCOCIN) IVPB 1000 mg/200 mL premix        1,000  mg 200 mL/hr over 60 Minutes Intravenous Every 12 hours 06/16/20 1123     06/16/20 0900  vancomycin (VANCOCIN) IVPB 1000 mg/200 mL premix  Status:  Discontinued        1,000 mg 200 mL/hr over 60 Minutes Intravenous Every 8 hours 06/16/20 0102 06/16/20 1005   06/16/20 0045  vancomycin (VANCOREADY) IVPB 2000 mg/400 mL        2,000 mg 200 mL/hr over 120 Minutes Intravenous  Once 06/16/20 0031 06/16/20 0307   06/16/20 0015  vancomycin (VANCOCIN) IVPB 1000 mg/200 mL premix  Status:  Discontinued        1,000 mg 200 mL/hr over 60 Minutes Intravenous  Once 06/16/20 0010 06/16/20 0031   06/16/20 0009  cefTRIAXone (ROCEPHIN) 2 g in sodium chloride 0.9 % 100 mL IVPB  Status:  Discontinued        2 g 200 mL/hr over 30 Minutes Intravenous Every 24 hours 06/16/20 0010 06/16/20 0034   06/15/20 2215  cefTRIAXone (ROCEPHIN) 2 g in sodium chloride 0.9 % 100 mL IVPB  Status:  Discontinued        2 g 200 mL/hr over 30 Minutes Intravenous Every 24 hours 06/15/20 2200 06/16/20 1108   06/15/20 2215  azithromycin (ZITHROMAX) 500 mg in sodium chloride 0.9 % 250 mL IVPB  Status:  Discontinued        500 mg 250 mL/hr over 60 Minutes Intravenous Every 24 hours 06/15/20 2200 06/16/20 1108       Objective: Vitals:   06/19/20 0600 06/19/20 0700 06/19/20 0800 06/19/20 0900  BP: 118/69 110/67 (!) 125/99 114/68  Pulse: (!) 116 (!) 118 (!) 126 (!) 128  Resp: (!) 25 (!) 28 (!) 36 (!) 24  Temp:   (P) 97.8 F (36.6 C)   TempSrc:   (P) Axillary   SpO2: (!) 89% 93% (!) 74% 95%  Weight:      Height:        Intake/Output Summary (Last 24 hours) at 06/19/2020 0938 Last data filed at 06/19/2020 9357 Gross per 24 hour  Intake 2125.61 ml  Output 525 ml  Net 1600.61 ml   Filed Weights   06/15/20 2114  Weight: 86.2 kg    Examination: General exam: Appears ill appearing Respiratory system: Diminished breath sounds, on BiPAP Cardiovascular system: S1 & S2 heard, tachycardic, regular rhythm. No pedal  edema. Gastrointestinal system: Abdomen is nondistended, soft and nontender. Normal bowel sounds heard. Central nervous system: Alert  Extremities: Symmetric in appearance bilaterally     Data Reviewed: I have personally reviewed following labs and imaging studies  CBC: Recent Labs  Lab 06/15/20 2125 06/15/20 2125 06/16/20 0034 06/16/20 0034 06/16/20 1537 06/16/20 1636 06/17/20 0453 06/18/20 0529 06/19/20 0450  WBC 15.1*  --  13.0*  --   --   --  18.7* 16.5* 15.9*  NEUTROABS 12.3*  --   --   --   --   --   --   --   --   HGB 11.5*   < > 10.3*   < > 14.0 14.6 14.7 10.1* 11.2*  HCT 36.3   < > 33.5*   < > 44.3 46.0 46.9* 31.6* 35.9*  MCV 82.1  --  83.1  --   --   --  81.4 81.2 83.1  PLT 568*  --  465*  --   --   --  438* 368 358   < > = values in this interval not displayed.   Basic Metabolic Panel: Recent Labs  Lab 06/15/20 2125 06/16/20 0034 06/17/20 0453 06/18/20 0529 06/19/20 0450  NA 143 140 135 134* 136  K 3.3* 3.3* 4.4 4.1 4.1  CL 101 100 102 99 101  CO2 25 27 19* 28 30  GLUCOSE 146* 124* 119* 106* 102*  BUN 22* 20 17 14 11   CREATININE 0.61 0.56 0.75 0.66 0.48  CALCIUM 9.5 9.1 9.0 8.7* 8.5*  MG 2.2  --  2.0 1.9  --   PHOS  --   --   --  3.2  --    GFR: Estimated Creatinine Clearance: 94.8 mL/min (by C-G formula based on SCr of 0.48 mg/dL). Liver Function Tests: Recent Labs  Lab 06/15/20 2125 06/16/20 0034  AST 43* 38  ALT 29 27  ALKPHOS 107 102  BILITOT 1.0 0.8  PROT 9.0* 8.8*  ALBUMIN 4.0 3.9  3.9   No results for input(s): LIPASE, AMYLASE in the last 168 hours. No results for input(s): AMMONIA in the last 168 hours. Coagulation Profile: Recent Labs  Lab 06/16/20 0034  INR 1.2   Cardiac Enzymes: No results for input(s): CKTOTAL, CKMB, CKMBINDEX, TROPONINI in the last 168 hours. BNP (last 3 results) No results for input(s): PROBNP in the last 8760 hours. HbA1C: No results for input(s): HGBA1C in the last  72 hours. CBG: No results for  input(s): GLUCAP in the last 168 hours. Lipid Profile: No results for input(s): CHOL, HDL, LDLCALC, TRIG, CHOLHDL, LDLDIRECT in the last 72 hours. Thyroid Function Tests: No results for input(s): TSH, T4TOTAL, FREET4, T3FREE, THYROIDAB in the last 72 hours. Anemia Panel: No results for input(s): VITAMINB12, FOLATE, FERRITIN, TIBC, IRON, RETICCTPCT in the last 72 hours. Sepsis Labs: Recent Labs  Lab 06/15/20 2125 06/15/20 2125 06/16/20 0034 06/16/20 0514 06/17/20 1105 06/18/20 0753  PROCALCITON 24.07  --  21.52  --   --   --   LATICACIDVEN 2.8*   < > 2.5* 2.3* 2.3* 1.6   < > = values in this interval not displayed.    Recent Results (from the past 240 hour(s))  Respiratory Panel by RT PCR (Flu A&B, Covid) - Nasopharyngeal Swab     Status: None   Collection Time: 06/15/20  9:25 PM   Specimen: Nasopharyngeal Swab  Result Value Ref Range Status   SARS Coronavirus 2 by RT PCR NEGATIVE NEGATIVE Final    Comment: (NOTE) SARS-CoV-2 target nucleic acids are NOT DETECTED.  The SARS-CoV-2 RNA is generally detectable in upper respiratoy specimens during the acute phase of infection. The lowest concentration of SARS-CoV-2 viral copies this assay can detect is 131 copies/mL. A negative result does not preclude SARS-Cov-2 infection and should not be used as the sole basis for treatment or other patient management decisions. A negative result may occur with  improper specimen collection/handling, submission of specimen other than nasopharyngeal swab, presence of viral mutation(s) within the areas targeted by this assay, and inadequate number of viral copies (<131 copies/mL). A negative result must be combined with clinical observations, patient history, and epidemiological information. The expected result is Negative.  Fact Sheet for Patients:  PinkCheek.be  Fact Sheet for Healthcare Providers:  GravelBags.it  This test is no t  yet approved or cleared by the [place] FDA and  has been authorized for detection and/or diagnosis of SARS-CoV-2 by FDA under an Emergency Use Authorization (EUA). This EUA will remain  in effect (meaning this test can be used) for the duration of the COVID-19 declaration under Section 564(b)(1) of the Act, 21 U.S.C. section 360bbb-3(b)(1), unless the authorization is terminated or revoked sooner.     Influenza A by PCR NEGATIVE NEGATIVE Final   Influenza B by PCR NEGATIVE NEGATIVE Final    Comment: (NOTE) The Xpert Xpress SARS-CoV-2/FLU/RSV assay is intended as an aid in  the diagnosis of influenza from Nasopharyngeal swab specimens and  should not be used as a sole basis for treatment. Nasal washings and  aspirates are unacceptable for Xpert Xpress SARS-CoV-2/FLU/RSV  testing.  Fact Sheet for Patients: PinkCheek.be  Fact Sheet for Healthcare Providers: GravelBags.it  This test is not yet approved or cleared by the [place] FDA and  has been authorized for detection and/or diagnosis of SARS-CoV-2 by  FDA under an Emergency Use Authorization (EUA). This EUA will remain  in effect (meaning this test can be used) for the duration of the  Covid-19 declaration under Section 564(b)(1) of the Act, 21  U.S.C. section 360bbb-3(b)(1), unless the authorization is  terminated or revoked. Performed at [facility], [address] [place], [place] [address]   Blood Culture (routine x 2)     Status: None (Preliminary result)   Collection Time: 06/15/20 10:09 PM   Specimen: BLOOD  Result Value Ref Range Status   Specimen Description BLOOD LEFT [facility]  Final  Special Requests   Final    BOTTLES DRAWN AEROBIC AND ANAEROBIC Blood Culture results may not be optimal due to an inadequate volume of blood received in culture bottles   Culture   Final    NO GROWTH 3 DAYS Performed at [facility], [address] [place], [place] [address]    Report Status PENDING  Incomplete  Blood Culture (routine x 2)     Status: None (Preliminary result)   Collection Time: 06/15/20 10:11 PM   Specimen: BLOOD  Result Value Ref Range Status   Specimen Description BLOOD RIGHT [facility]  Final   Special Requests   Final    BOTTLES DRAWN AEROBIC AND ANAEROBIC Blood Culture adequate volume   Culture   Final    NO GROWTH 3 DAYS Performed at [facility], [address], [place], [place] [address]    Report Status PENDING  Incomplete  Urine culture     Status: None   Collection Time: 06/15/20 11:40 PM   Specimen: Urine, Random  Result Value Ref Range Status   Specimen Description   Final    URINE, RANDOM Performed at [facility], [address], [place], [place] [address]    Special Requests   Final    NONE Performed at [facility], [address] [place], [place] [address]    Culture   Final    NO GROWTH Performed at [facility], [place] [address], [place], [place] [address]    Report Status 06/17/2020 FINAL  Final  Pleural Fluid culture (includes gram stain)     Status: None   Collection Time: 06/15/20 11:40 PM   Specimen: Pleural Fluid  Result Value Ref Range Status   Specimen Description   Final    PLEURAL Performed at [facility], [address] [place], [place] [address]    Special Requests   Final    NONE Performed at [facility], [place], [place] [address]    Gram Stain NO WBC SEEN NO ORGANISMS SEEN   Final   Culture   Final    NO GROWTH 3 DAYS Performed at [facility], [place] [address] [place], [place] [address]    Report Status 06/19/2020 FINAL  Final  MRSA PCR Screening     Status: None   Collection Time: 06/16/20 11:51 PM   Specimen: Nasopharyngeal  Result Value Ref Range Status   MRSA by PCR NEGATIVE NEGATIVE Final    Comment:        The GeneXpert MRSA Assay (FDA approved for NASAL  specimens only), is one component of a comprehensive MRSA colonization surveillance program. It is not intended to diagnose MRSA infection nor to guide or monitor treatment for MRSA infections. Performed at [facility], [address] [place], [place] [address]       Radiology Studies: DG CHEST PORT 1 VIEW  Result Date: 06/18/2020 CLINICAL DATA:  Malignant pleural effusion, RIGHT-side chest tube EXAM: PORTABLE CHEST 1 VIEW COMPARISON:  Portable exam 1042 hours compared to 11 x 21 FINDINGS: Pigtail RIGHT thoracostomy tube again seen. Persistent subtotal opacification of the RIGHT hemithorax by effusion and atelectasis, underlying tumor not excluded. Atelectasis versus consolidation of LEFT lower lobe with probable small pleural effusion. Stable heart size. No acute osseous findings. IMPRESSION: Persistent subtotal opacification of the RIGHT hemithorax by effusion and atelectasis, underlying tumor not excluded. Persistent infiltrate and probable pleural effusion at inferior LEFT chest. Electronically Signed  By: Lavonia Dana M.D.   On: 06/18/2020 16:09   DG Chest Port 1 View  Result Date: 06/17/2020 CLINICAL DATA:  Chest x-ray 06/17/2020 5 a.m. EXAM: PORTABLE CHEST 1 VIEW COMPARISON:  Chest x-ray 06/17/2020 4:10 a.m. FINDINGS: Interval increase in size of a likely large right pleural effusion with slight aeration of the right lung. Likely trace pleural effusion on the left. Interval increase in airspace interstitial opacities of the left lung that is most prominent in the left mid lung zone. Right pigtail chest tube overlies the right hemithorax. IMPRESSION: 1. Slightly improved almost complete whiteout of the right chest with slightly decreased in size large pleural effusion. 2. Interval increase in interstitial and alveolar left lung opacities with possible trace left pleural effusion. Electronically Signed   By: Iven Finn M.D.   On: 06/17/2020 21:30      Scheduled Meds: .  Chlorhexidine Gluconate Cloth  6 each Topical Q0600  . melatonin  5 mg Oral QHS  . sodium chloride flush  10-40 mL Intracatheter Q12H   Continuous Infusions: . ceFEPime (MAXIPIME) IV 2 g (06/19/20 0647)  . lactated ringers 10 mL/hr at 06/19/20 0600  . vancomycin 1,000 mg (06/19/20 0929)     LOS: 4 days      Time spent: 40 minutes   Dessa Phi, DO [facility] 06/19/2020, 9:38 AM   Available via Epic secure chat 7am-7pm After these hours, please refer to coverage provider listed on [email or web address]

## 2020-06-19 NOTE — Progress Notes (Signed)
Hematology/Oncology Progress Note [facility] Telephone:([phone] [phone]  Patient Care Team: System, Provider Not In as PCP - General   Name of the patient: Jessica Willis  [id number]  1966/01/19  Date of visit: 06/19/20   INTERVAL HISTORY-  + chest tube.  Remains in ICU due to acute respiratory failure. No family members at bedside.  Review of systems- Review of Systems  Unable to perform ROS: Severe respiratory distress  Constitutional: Positive for appetite change, fatigue and unexpected weight change.  Respiratory: Positive for cough and shortness of breath.   Cardiovascular: Negative for leg swelling.  Gastrointestinal: Negative for abdominal pain.  Skin: Negative for rash.  Neurological: Negative for light-headedness.  Psychiatric/Behavioral: Negative for confusion.    No Known Allergies  Patient Active Problem List   Diagnosis Date Noted  . Palliative care encounter   . Hemothorax on right 06/16/2020  . Acute respiratory failure with hypoxia ([place])   . Breast mass, right 06/15/2020  . Mastitis 06/15/2020  . Pleural effusion, right 06/15/2020  . Intestinal obstruction ([place])   . Leukocytosis   . Hypokalemia 06/24/2015  . Sepsis ([place]) 06/23/2015  . CAP (community acquired pneumonia) 06/23/2015  . Partial small bowel obstruction ([place]) 06/23/2015     Past Medical History:  Diagnosis Date  . Anemia   . Patient denies medical problems      Past Surgical History:  Procedure Laterality Date  . TUBAL LIGATION      Social History   Socioeconomic History  . Marital status: Single    Spouse name: Not on file  . Number of children: Not on file  . Years of education: Not on file  . Highest education level: Not on file  Occupational History  . Not on file  Tobacco Use  . Smoking status: Never Smoker  . Smokeless tobacco: Never Used  Substance and Sexual Activity  . Alcohol use: Not Currently    Alcohol/week:  1.0 standard drink    Types: 1 Cans of beer per week  . Drug use: No  . Sexual activity: Not on file  Other Topics Concern  . Not on file  Social History Narrative  . Not on file   Social Determinants of Health   Financial Resource Strain:   . Difficulty of Paying Living Expenses: Not on file  Food Insecurity:   . Worried About Charity fundraiser in the Last Year: Not on file  . Ran Out of Food in the Last Year: Not on file  Transportation Needs:   . Lack of Transportation (Medical): Not on file  . Lack of Transportation (Non-Medical): Not on file  Physical Activity:   . Days of Exercise per Week: Not on file  . Minutes of Exercise per Session: Not on file  Stress:   . Feeling of Stress : Not on file  Social Connections:   . Frequency of Communication with Friends and Family: Not on file  . Frequency of Social Gatherings with Friends and Family: Not on file  . Attends Religious Services: Not on file  . Active Member of Clubs or Organizations: Not on file  . Attends Archivist Meetings: Not on file  . Marital Status: Not on file  Intimate Partner Violence:   . Fear of Current or Ex-Partner: Not on file  . Emotionally Abused: Not on file  . Physically Abused: Not on file  . Sexually Abused: Not on file     Family History  Problem  Relation Age of Onset  . Diabetes Other   . Hypertension Other      Current Facility-Administered Medications:  .  acetaminophen (TYLENOL) tablet 650 mg, 650 mg, Oral, Q6H PRN, 650 mg at 06/18/20 1643 **OR** acetaminophen (TYLENOL) suppository 650 mg, 650 mg, Rectal, Q6H PRN, Jonelle Sidle, Mohammad L, MD .  ceFEPIme (MAXIPIME) 2 g in sodium chloride 0.9 % 100 mL IVPB, 2 g, Intravenous, Q8H, Dessa Phi, DO, Last Rate: 200 mL/hr at 06/19/20 1401, 2 g at 06/19/20 1401 .  Chlorhexidine Gluconate Cloth 2 % PADS 6 each, 6 each, Topical, Q0600, Bradly Bienenstock, NP, 6 each at 06/19/20 0600 .  lactated ringers infusion, , Intravenous,  Continuous, Ramaswamy, Murali, MD, Last Rate: 10 mL/hr at 06/19/20 1200, Rate Verify at 06/19/20 1200 .  linezolid (ZYVOX) IVPB 600 mg, 600 mg, Intravenous, Q12H, Dessa Phi, DO .  LORazepam (ATIVAN) injection 1 mg, 1 mg, Intravenous, Q6H PRN, Darel Hong D, NP, 1 mg at 06/18/20 2121 .  melatonin tablet 5 mg, 5 mg, Oral, QHS, Sharion Settler, NP, 5 mg at 06/16/20 2241 .  metoprolol tartrate (LOPRESSOR) injection 5 mg, 5 mg, Intravenous, Q6H PRN, Sharion Settler, NP, 5 mg at 06/19/20 0528 .  morphine 2 MG/ML injection 1-2 mg, 1-2 mg, Intravenous, Q4H PRN, Darel Hong D, NP, 2 mg at 06/19/20 0928 .  ondansetron (ZOFRAN) tablet 4 mg, 4 mg, Oral, Q6H PRN **OR** ondansetron (ZOFRAN) injection 4 mg, 4 mg, Intravenous, Q6H PRN, Garba, Mohammad L, MD .  sodium chloride flush (NS) 0.9 % injection 10-40 mL, 10-40 mL, Intracatheter, Q12H, Dessa Phi, DO, 10 mL at 06/19/20 0929 .  sodium chloride flush (NS) 0.9 % injection 10-40 mL, 10-40 mL, Intracatheter, PRN, Dessa Phi, DO .  traMADol Veatrice Bourbon) tablet 50 mg, 50 mg, Oral, Q6H PRN, Dessa Phi, DO, 50 mg at 06/19/20 1401 .  traZODone (DESYREL) tablet 50 mg, 50 mg, Oral, QHS PRN, Darel Hong D, NP, 50 mg at 06/18/20 2109   Physical exam:  Vitals:   06/19/20 1000 06/19/20 1100 06/19/20 1200 06/19/20 1300  BP: 116/69 133/70 110/60   Pulse: (!) 123 (!) 123 (!) 123 (!) 121  Resp: (!) 27 (!) 39 (!) 30 (!) 22  Temp:   (!) 97.5 F (36.4 C)   TempSrc:   Axillary   SpO2: 95% (!) 86% 93% 94%  Weight:      Height:       Physical Exam Constitutional:      Appearance: She is ill-appearing.     Comments: Mild respiratory distress  Cardiovascular:     Rate and Rhythm: Tachycardia present.     Heart sounds: Normal heart sounds.  Pulmonary:     Breath sounds: No wheezing.  Abdominal:     General: There is no distension.     Palpations: Abdomen is soft.  Musculoskeletal:        General: No swelling. Normal range of motion.      Cervical back: Normal range of motion.  Skin:    General: Skin is warm.     Coloration: Skin is not jaundiced.  Neurological:     General: No focal deficit present.     Mental Status: She is alert and oriented to person, place, and time.  Psychiatric:        Mood and Affect: Mood normal.    right breast mass involving skin      CMP Latest Ref Rng & Units 06/19/2020  Glucose 70 - 99 mg/dL 102(H)  BUN 6 - 20 mg/dL 11  Creatinine 0.44 - 1.00 mg/dL 0.48  Sodium 135 - 145 mmol/L 136  Potassium 3.5 - 5.1 mmol/L 4.1  Chloride 98 - 111 mmol/L 101  CO2 22 - 32 mmol/L 30  Calcium 8.9 - 10.3 mg/dL 8.5(L)  Total Protein 6.5 - 8.1 g/dL -  Total Bilirubin 0.3 - 1.2 mg/dL -  Alkaline Phos 38 - 126 U/L -  AST 15 - 41 U/L -  ALT 0 - 44 U/L -   CBC Latest Ref Rng & Units 06/19/2020  WBC 4.0 - 10.5 K/uL 15.9(H)  Hemoglobin 12.0 - 15.0 g/dL 11.2(L)  Hematocrit 36 - 46 % 35.9(L)  Platelets 150 - 400 K/uL 358    RADIOGRAPHIC STUDIES: I have personally reviewed the radiological images as listed and agreed with the findings in the report. DG Chest 1 View  Result Date: 06/16/2020 CLINICAL DATA:  Hemothorax on the right. EXAM: CHEST  1 VIEW COMPARISON:  Same day chest radiograph. FINDINGS: A right-sided pleural pigtail catheter is redemonstrated. There is interval improvement in aeration of the right lung with decreased size of a right hemothorax. A small right pneumothorax is not significantly changed in size. Severe right atelectasis/airspace disease is redemonstrated. Mild interstitial opacities in the left lung appear unchanged. There is no left pleural effusion or left pneumothorax. The cardiac silhouette is partially obscured but appears unchanged. IMPRESSION: 1. Interval improvement in aeration of the right lung with decreased size of a right hemothorax. 2. Unchanged small right pneumothorax with pleural pigtail catheter in place. 3. Severe right atelectasis/airspace disease. Electronically  Signed   By: Zerita Boers M.D.   On: 06/16/2020 14:52   DG Chest 1 View  Result Date: 06/16/2020 CLINICAL DATA:  Cough and phlegm production. EXAM: CHEST  1 VIEW COMPARISON:  Chest radiograph dated 06/15/2020. FINDINGS: The cardiac silhouette is obscured. A right-sided pleural pigtail catheter is in unchanged location and there has been interval decrease in a large right pleural fluid collection (effusion versus hemothorax). There is no longer mediastinal shift to the left. There is a small right pneumothorax. Severe airspace and interstitial opacities are seen on the right. Mild interstitial opacities are seen on the left. There is no left pleural effusion or pneumothorax. The osseous structures are intact. IMPRESSION: 1. Small right pneumothorax with a right-sided pleural pigtail catheter in place. 2. Interval decrease in a large right pleural fluid collection (effusion versus hemothorax). 3. Severe airspace and interstitial opacities on the right. Mild interstitial opacities in the left. Electronically Signed   By: Zerita Boers M.D.   On: 06/16/2020 14:28   DG Chest 1 View  Result Date: 06/15/2020 CLINICAL DATA:  Short of breath, cough EXAM: CHEST  1 VIEW COMPARISON:  06/29/2015 FINDINGS: 2 frontal views of the chest demonstrate complete opacification of the right hemithorax, with slight mediastinal shift to the left, consistent with large right pleural effusion and underlying lung consolidation. Chronic areas of scarring are seen within the left chest. No pneumothorax. Cardiac silhouette is unremarkable. No acute bony abnormalities. IMPRESSION: 1. Opacification of the right hemithorax consistent with large right pleural effusion and underlying right lung consolidation. Electronically Signed   By: Randa Ngo M.D.   On: 06/15/2020 21:42   CT Angio Chest PE W and/or Wo Contrast  Result Date: 06/15/2020 CLINICAL DATA:  Cough, shortness of breath EXAM: CT ANGIOGRAPHY CHEST WITH CONTRAST TECHNIQUE:  Multidetector CT imaging of the chest was performed using the standard protocol during bolus administration of  intravenous contrast. Multiplanar CT image reconstructions and MIPs were obtained to evaluate the vascular anatomy. CONTRAST:  100 mL Omnipaque 350 IV COMPARISON:  06/30/2015 FINDINGS: Cardiovascular: No filling defects in the pulmonary arteries to suggest pulmonary emboli. Heart is normal size. Aorta normal caliber. Mediastinum/Nodes: No visible mediastinal or hilar adenopathy. Bilateral axillary adenopathy, right greater than left. Index right axillary lymph node has a short axis diameter of 13 mm. Left axillary lymph node has a short axis diameter of 11 mm. Lungs/Pleura: Large right pleural effusion completely compressed seen in the right lung which is not aerated. This appears to have mass effect with shift of the right lung and mediastinal structures to the left. Areas of atelectasis or scarring in the left lung base. No effusion on the left. Upper Abdomen: Imaging into the upper abdomen demonstrates no acute findings. Musculoskeletal: Large mass noted in the right breast measuring 8 x 6 cm. Possible separate right breast mass more inferiorly in the right breast measuring 8 x 4 cm. Skin thickening or edema noted. Associated right axillary adenopathy. A few ill-defined sclerotic areas within the thoracic spine could reflect metastases. Review of the MIP images confirms the above findings. IMPRESSION: No evidence of pulmonary embolus. One, possibly 2 separate large masses within the right breast extending to the skin surface with associated adjacent skin thickening and right axillary adenopathy. Findings compatible with right breast cancer. Large right pleural effusion which appears to have mass effect with complete collapse/compression of the right lung and shift of mediastinal structures to the left. Single enlarged left axillary lymph node. These results were called by telephone at the time of  interpretation on 06/15/2020 at 10:43 pm to provider [facility] , who verbally acknowledged these results. Electronically Signed   By: Rolm Baptise M.D.   On: 06/15/2020 22:46   US Venous Img Lower Unilateral Left (DVT)  Result Date: 06/16/2020 CLINICAL DATA:  Initial evaluation for acute left lower extremity edema. EXAM: LEFT LOWER EXTREMITY VENOUS DOPPLER ULTRASOUND TECHNIQUE: Gray-scale sonography with graded compression, as well as color Doppler and duplex ultrasound were performed to evaluate the lower extremity deep venous systems from the level of the common femoral vein and including the common femoral, femoral, profunda femoral, popliteal and calf veins including the posterior tibial, peroneal and gastrocnemius veins when visible. The superficial great saphenous vein was also interrogated. Spectral Doppler was utilized to evaluate flow at rest and with distal augmentation maneuvers in the common femoral, femoral and popliteal veins. COMPARISON:  None. FINDINGS: Contralateral Common Femoral Vein: Respiratory phasicity is normal and symmetric with the symptomatic side. No evidence of thrombus. Normal compressibility. Common Femoral Vein: No evidence of thrombus. Normal compressibility, respiratory phasicity and response to augmentation. Saphenofemoral Junction: No evidence of thrombus. Normal compressibility and flow on color Doppler imaging. Profunda Femoral Vein: No evidence of thrombus. Normal compressibility and flow on color Doppler imaging. Femoral Vein: No evidence of thrombus. Normal compressibility, respiratory phasicity and response to augmentation. Popliteal Vein: No evidence of thrombus. Normal compressibility, respiratory phasicity and response to augmentation. Calf Veins: No evidence of thrombus. Normal compressibility and flow on color Doppler imaging. Superficial Great Saphenous Vein: No evidence of thrombus. Normal compressibility. Venous Reflux:  None. Other Findings:  None.  IMPRESSION: No evidence of deep venous thrombosis. Electronically Signed   By: Jeannine Boga M.D.   On: 06/16/2020 01:30   DG Chest Port 1 View  Result Date: 06/19/2020 CLINICAL DATA:  Chest tube in place EXAM: PORTABLE CHEST 1 VIEW COMPARISON:  Chest radiograph from one day prior. FINDINGS: Right basilar pigtail chest tube is stable. Stable cardiomediastinal silhouette with mild cardiomegaly. No pneumothorax. Near complete opacification of the right hemithorax with minimally improved central right lung aeration. Extensive fluffy left parahilar lung opacities, slightly worsened. Left retrocardiac consolidation is stable. IMPRESSION: 1. Stable right basilar chest tube without pneumothorax. 2. Near complete opacification of the right hemithorax with minimally improved central right lung aeration, which could be due to any combination of pleural effusion, atelectasis, pneumonia, edema or mass. 3. Stable left retrocardiac consolidation, favor atelectasis. 4. Fluffy left parahilar lung opacities, slightly worsened, favor pulmonary edema. Electronically Signed   By: Ilona Sorrel M.D.   On: 06/19/2020 15:04   DG CHEST PORT 1 VIEW  Result Date: 06/18/2020 CLINICAL DATA:  Malignant pleural effusion, RIGHT-side chest tube EXAM: PORTABLE CHEST 1 VIEW COMPARISON:  Portable exam 1042 hours compared to 11 x 21 FINDINGS: Pigtail RIGHT thoracostomy tube again seen. Persistent subtotal opacification of the RIGHT hemithorax by effusion and atelectasis, underlying tumor not excluded. Atelectasis versus consolidation of LEFT lower lobe with probable small pleural effusion. Stable heart size. No acute osseous findings. IMPRESSION: Persistent subtotal opacification of the RIGHT hemithorax by effusion and atelectasis, underlying tumor not excluded. Persistent infiltrate and probable pleural effusion at inferior LEFT chest. Electronically Signed   By: Lavonia Dana M.D.   On: 06/18/2020 16:09   DG Chest Port 1 View  Result  Date: 06/17/2020 CLINICAL DATA:  Chest x-ray 06/17/2020 5 a.m. EXAM: PORTABLE CHEST 1 VIEW COMPARISON:  Chest x-ray 06/17/2020 4:10 a.m. FINDINGS: Interval increase in size of a likely large right pleural effusion with slight aeration of the right lung. Likely trace pleural effusion on the left. Interval increase in airspace interstitial opacities of the left lung that is most prominent in the left mid lung zone. Right pigtail chest tube overlies the right hemithorax. IMPRESSION: 1. Slightly improved almost complete whiteout of the right chest with slightly decreased in size large pleural effusion. 2. Interval increase in interstitial and alveolar left lung opacities with possible trace left pleural effusion. Electronically Signed   By: Iven Finn M.D.   On: 06/17/2020 21:30   DG Chest Port 1 View  Result Date: 06/17/2020 CLINICAL DATA:  Pleural effusion EXAM: PORTABLE CHEST 1 VIEW COMPARISON:  Yesterday FINDINGS: More dense and homogeneous right sided opacity. Right pleural catheter in stable position. Left lower lobe opacity which also appears increased. Likely no cardiomegaly when allowing for mediastinal shift. IMPRESSION: White out of the right chest with mediastinal mass effect from large pleural effusion. Retrocardiac infiltrate that could be infection or atelectasis. Electronically Signed   By: Monte Fantasia M.D.   On: 06/17/2020 05:00   DG Chest Portable 1 View  Result Date: 06/15/2020 CLINICAL DATA:  Right chest tube placement. EXAM: PORTABLE CHEST 1 VIEW COMPARISON:  Radiograph and CT earlier today. FINDINGS: Placement of right pigtail catheter. There is persistent complete opacification of the right hemithorax. There may be slight diminished mediastinal shift to the left. No visualized pneumothorax. No focal abnormality in the left lung. IMPRESSION: Placement of right pigtail catheter with persistent complete opacification of the right hemithorax. There may be slight diminished mediastinal  shift to the left. No visualized pneumothorax. Electronically Signed   By: Keith Rake M.D.   On: 06/15/2020 23:40    Assessment and plan-    #Acute hypoxic respiratory failure, secondary to right pleural effusion and complete collapse of right lung. Status post thoracentesis and chest tube  insertion.  Remains on BiPAP. Pleural fluid is Likely malignant. Exudate.   Cytology is pending. I discussed with the patient that clinically she has metastatic breast cancer with right pleural effusion.  This is not curable Prognosis is poor.  Depending on if her respiratory status may be reversible or not as well as the final pathology.  If cancer is hormone receptor positive and/ or HER-2 positive, we may consider adding antihormonal therapy in order target therapy which hopefully may help her to improve.  If this is triple negative, no chemotherapy can be offered due to her current condition.  I offered patient to talk to her family members and she prefers to hold off until the pathology report is back. Palliative care following.  CODE STATUS  full code.  #Sepsis due to right breast cellulitis and possible HCAP.  Continue IV antibiotics.  DVT prophylaxis with Lovenox. Thank you for allowing me to participate in the care of this patient.   Earlie Server, MD, PhD Hematology Oncology [facility] Pager- [phone] 06/19/2020

## 2020-06-20 ENCOUNTER — Other Ambulatory Visit: Payer: Self-pay | Admitting: Anatomic Pathology & Clinical Pathology

## 2020-06-20 LAB — CBC
HCT: 31.1 % — ABNORMAL LOW (ref 36.0–46.0)
Hemoglobin: 9.7 g/dL — ABNORMAL LOW (ref 12.0–15.0)
MCH: 26 pg (ref 26.0–34.0)
MCHC: 31.2 g/dL (ref 30.0–36.0)
MCV: 83.4 fL (ref 80.0–100.0)
Platelets: 325 10*3/uL (ref 150–400)
RBC: 3.73 MIL/uL — ABNORMAL LOW (ref 3.87–5.11)
RDW: 12.6 % (ref 11.5–15.5)
WBC: 11.7 10*3/uL — ABNORMAL HIGH (ref 4.0–10.5)
nRBC: 0 % (ref 0.0–0.2)

## 2020-06-20 LAB — BASIC METABOLIC PANEL
Anion gap: 9 (ref 5–15)
BUN: 12 mg/dL (ref 6–20)
CO2: 28 mmol/L (ref 22–32)
Calcium: 8.5 mg/dL — ABNORMAL LOW (ref 8.9–10.3)
Chloride: 101 mmol/L (ref 98–111)
Creatinine, Ser: 0.52 mg/dL (ref 0.44–1.00)
GFR, Estimated: >60 mL/min (ref >60)
Glucose, Bld: 95 mg/dL (ref 70–99)
Potassium: 4.3 mmol/L (ref 3.5–5.1)
Sodium: 138 mmol/L (ref 135–145)

## 2020-06-20 LAB — CULTURE, BLOOD (ROUTINE X 2)
Culture: NO GROWTH
Culture: NO GROWTH
Special Requests: ADEQUATE

## 2020-06-20 NOTE — Plan of Care (Signed)
Weaning [facility].  Educated patient on menu and how to call in order.

## 2020-06-20 NOTE — Progress Notes (Signed)
   06/20/20 1210  Clinical Encounter Type  Visited With Patient and family together  Visit Type Follow-up  Referral From Chaplain  Consult/Referral To Chaplain  Chaplain stopped in to see if Pt was ready to complete her AD. Pt's daughter was in the room and said the form was filled out. Chaplain told them that she will get two witnesses and a notary to complete the form. She also said that it the form was not completed today that she will work on get witnesses and a Proofreader. Pt said that was okay.

## 2020-06-20 NOTE — Progress Notes (Signed)
Subjective:  CC: Jessica Willis is a 54 y.o. female  Hospital stay day 5,   right breast mass, right pleural effusion  HPI: Pt with no new complaints.  ROS:  General: Denies weight loss, weight gain, fatigue, fevers, chills, and night sweats. Heart: Denies chest pain, palpitations, racing heart, irregular heartbeat, leg pain or swelling, and decreased activity tolerance. Respiratory: Denies breathing difficulty, shortness of breath, wheezing, cough, and sputum. GI: Denies change in appetite, heartburn, nausea, vomiting, constipation, diarrhea, and blood in stool. GU: Denies difficulty urinating, pain with urinating, urgency, frequency, blood in urine.   Objective:   Temp:  [97.5 F (36.4 C)-98.8 F (37.1 C)] 98.2 F (36.8 C) (11/08 1400) Pulse Rate:  [121-130] 124 (11/08 0600) Resp:  [20-36] 35 (11/08 0600) BP: (115-136)/(66-88) 130/80 (11/08 0600) SpO2:  [90 %-97 %] 94 % (11/08 1510) FiO2 (%):  [80 %-93 %] 80 % (11/08 1510)     Height: 5\' 10"  (177.8 cm) Weight: 86.2 kg BMI (Calculated): 27.26   Intake/Output this shift:   Intake/Output Summary (Last 24 hours) at 06/20/2020 1640 Last data filed at 06/20/2020 1500 Gross per 24 hour  Intake 240 ml  Output 835 ml  Net -595 ml    Constitutional :  alert, cooperative, appears stated age and no distress  Respiratory:  clear to auscultation bilaterally. Chest tube with serosanguinous drainage.  Cardiovascular:  tachycardic  Gastrointestinal: soft, non-tender; bowel sounds normal; no masses,  no organomegaly.   Skin: Cool and moist. chpaerone present. Breast mass as before, small areas of ulceration but no active bleeding, non-tender, and no active purulent discharge even with increased pressure during palpation.  Psychiatric: Normal affect, non-agitated, not confused       LABS:  CMP Latest Ref Rng & Units 06/20/2020 06/19/2020 06/18/2020  Glucose 70 - 99 mg/dL 95 102(H) 106(H)  BUN 6 - 20 mg/dL 12 11 14   Creatinine 0.44 -  1.00 mg/dL 0.52 0.48 0.66  Sodium 135 - 145 mmol/L 138 136 134(L)  Potassium 3.5 - 5.1 mmol/L 4.3 4.1 4.1  Chloride 98 - 111 mmol/L 101 101 99  CO2 22 - 32 mmol/L 28 30 28   Calcium 8.9 - 10.3 mg/dL 8.5(L) 8.5(L) 8.7(L)  Total Protein 6.5 - 8.1 g/dL - - -  Total Bilirubin 0.3 - 1.2 mg/dL - - -  Alkaline Phos 38 - 126 U/L - - -  AST 15 - 41 U/L - - -  ALT 0 - 44 U/L - - -   CBC Latest Ref Rng & Units 06/20/2020 06/19/2020 06/18/2020  WBC 4.0 - 10.5 K/uL 11.7(H) 15.9(H) 16.5(H)  Hemoglobin 12.0 - 15.0 g/dL 9.7(L) 11.2(L) 10.1(L)  Hematocrit 36 - 46 % 31.1(L) 35.9(L) 31.6(L)  Platelets 150 - 400 K/uL 325 358 368   CYTOLOGY - NON GYN CYTOLOGY - NON PAP  CASE: [id number]  PATIENT: Jessica Willis  Non-Gynecological Cytology Report      Specimen Submitted:  A. Pleural fluid   Clinical History: None provided     DIAGNOSIS:  A. PLEURAL FLUID; ULTRASOUND-GUIDED THORACENTESIS:  - POSITIVE FOR MALIGNANCY.  - METASTATIC ADENOCARCINOMA, FAVOR MAMMARY PRIMARY.   Comment:  Smears and cell block preparations display a cellular specimen with  abnormal cells scattered singly throughout the specimen. These abnormal  cells display increased cell size, high N:C ratio, and visible nucleoli.  Immunohistochemical studies show these abnormal cells to be positive for  GATA3 and MOC-31, and negative for TTF-1. The patient's imaging studies  showing large right breast mass(es)  are noted. The findings are most  suggestive of metastatic adenocarcinoma, with the immunohistochemical  staining pattern and radiographic features favoring a mammary primary.   Slides reviewed: 3 ThinPrep, 3 cell block, 2 cytospin   IHC slides were prepared by [facility]  Pathology, [place], [place]. All controls stained appropriately.   This test was developed and its performance characteristics determined  by [facility]. It has not been cleared or approved by the [place] Food and Drug   Administration. The FDA does not require this test to go through  premarket FDA review. This test is used for clinical purposes. It should  not be regarded as investigational or for research. This laboratory is  certified under the Clinical Laboratory Improvement Amendments (CLIA) as  qualified to perform high complexity clinical laboratory testing.   GROSS DESCRIPTION:  A. Labeled: Pleural fluid  Received: Fresh  Volume: 1 syringe 52 mL, 1 syringe 35 mL, and 1 45mL tube  Description of fluid and container in which it is received: 2 Plastic  syringes and one lavender top tube all containing dark red fluid  Cytospin slide(s) received: 2   Specimen material submitted for: Cell block and ThinPrep   The cell block material is fixed in formalin for 6 hours prior to  processing.    Final Diagnosis performed by Allena Napoleon, MD.  Electronically signed  06/20/2020 2:19:37PM  The electronic signature indicates that the named Attending Pathologist  has evaluated the specimen  Technical component performed at [facility], [address] [place],  [place] [address] Lab: [phone] Dir: Rush Farmer, MD, MMM  Professional component performed at [facility], [facility], [place], [place], [place] [address] Lab: [phone]  Dir: Dellia Nims. Rubinas, MD       RADS: CLINICAL DATA:  Chest tube in place  EXAM: PORTABLE CHEST 1 VIEW  COMPARISON:  Chest radiograph from one day prior.  FINDINGS: Right basilar pigtail chest tube is stable. Stable cardiomediastinal silhouette with mild cardiomegaly. No pneumothorax. Near complete opacification of the right hemithorax with minimally improved central right lung aeration. Extensive fluffy left parahilar lung opacities, slightly worsened. Left retrocardiac consolidation is stable.  IMPRESSION: 1. Stable right basilar chest tube without pneumothorax. 2. Near complete opacification of the right hemithorax  with minimally improved central right lung aeration, which could be due to any combination of pleural effusion, atelectasis, pneumonia, edema or mass. 3. Stable left retrocardiac consolidation, favor atelectasis. 4. Fluffy left parahilar lung opacities, slightly worsened, favor pulmonary edema.   Electronically Signed   By: Ilona Sorrel M.D.   On: 06/19/2020 15:04  Assessment:   Right breast mass- confirmed breast CA with cytology. Right pleural effusion,malignant.  Pt otherwise continues to be stable, with decreasing leukocytosis and LA levels now.  From mass infection standpoint, no need for any drainage or acute surgical intervention, since incision into this lesion will likely cause more bleeding, wound healing issues with little clinical benefit at this time.  Gen surg to sign off.  Please call with any new issues.  Further care of chest tube per thoracic surgery.

## 2020-06-20 NOTE — Progress Notes (Signed)
CRITICAL CARE NOTE  Jessica Willis is a 54 y.o. African American female with a past medical history significant for anemia and a right pleural empyema strep pyogenes in 2016. She presented to [facility] ED on 06/15/2020 with complaints of shortness of breath and progressive cough for the last week. She also admits to a large mass on the right breast and skin changes that have been worsening over the last 6 months, though she never sought medical evaluation. Upon presentation, patient was noted to have a large right breast mass with a duplicate mass above the breast which looked necrotic with bullae and purulent discharge.  Workup in the ED revealed the following: Temp 98.2, BP 180/107, HR 144, RR 38, SpO2 94% on room air, sodium 143, potassium 3.3, chloride 101, CO2 25, BUN 22, creatinine 0.61, lactic acid 2.8, glucose 146, and calcium 9.5. Chest CT angiogram showed no evidence of PE. There was one or possibly two separate large masses seen on the right breast with extension into the skin surface and associated skin thickening and right axillary adenopathy. These findings are consistent with right breast cancer. Imaging also shows a large right pleural effusion with mass-effect, left shift of the mediastinal structures, and collapse of the right lung. Thoracentesis was performed in the ED with the insertion of a pigtail catheter, which yielded 500 cc of serosanguineous fluid. Cytology was sent 06/15/20.   She was admitted by the Hospitalist for further workup and treatment of acute hypoxic respiratory failure in the setting of large right pleural effusion (suspected malignant pleural effusion) due to right breast mass and sepsis in the setting of right breast cellulitis. On 06/16/2020, she became tachycardic, tachypneic, with worsening hypoxia. Follow up chest x-ray showed persistent complete opacification of the right hemithorax along with a slight mediastinal shift to the left. General surgery was consulted to  evaluate need for larger chest tube placement. General surgery placed pigtail to suction and drained 3.5L of dark serosanguinous fluid (did not appear like fresh blood). Cardiothoracic surgery was also consulted to ensure no concern for active bleeding or need for possible surgical intervention. Serial chest x-rays showed that the pleural effusion was improving, and output has slowed considerably. Oncology and Palliative Care have also been consulted.  CC  Follow up respiratory failure  SUBJECTIVE Patient remains critically ill Prognosis is guarded    BP 130/80   Pulse (!) 124   Temp (!) 97.5 F (36.4 C) (Axillary)   Resp (!) 35   Ht 5\' 10"  (1.778 m)   Wt 86.2 kg   LMP 06/16/2015 Comment: pt has had tubes tied   SpO2 96%   BMI 27.26 kg/m    I/O last 3 completed shifts: In: 807.2 [I.V.:181.8; IV Piggyback:625.4] Out: 760 [Urine:250; Chest Tube:510] Total I/O In: 120 [P.O.:120] Out: 10 [Chest Tube:10]  SpO2: 96 % O2 Flow Rate (L/min): 50 L/min FiO2 (%): 93 %  Estimated body mass index is 27.26 kg/m as calculated from the following:   Height as of this encounter: 5\' 10"  (1.778 m).   Weight as of this encounter: 86.2 kg.  SIGNIFICANT EVENTS  11/3 - Right pigtail catheter inserted 11/3 - CTA chest: no evidence of PE; one or two large masses in right breast with extension to skin surface, compatible with right breast cancer; large right pleural effusion with mass effect and complete collapse/compression of right lung and left mediastinal shift; single enlarged left axillary lymph node 11/3 - CXR: right pigtail catheter placed with complete opacification of right  hemithorax; no visualized pneumothorax 11/4 - Venous US Left LE: no evidence of DVT 11/4 - CXR: small right pneumothorax with right-sided pleural effusion, pigtail catheter in place; interval decrease in a large right pleural fluid collection (effusion vs hemothorax); severe airspace and interstitial opacities on the  right, mild interstitial opacities on the left 11/5 - CXR: slightly improved, almost complete whiteout of right chest, slightly decreased size of pleural effusion; interval increase in interstitial and alveolar left lung opacities and possible trace left pleural effusion 11/5 - Currently on 8L [place]; hemodynamically stable on pressors, afebrile 11/6 - CXR: persistent subtotal opacification of right hemithorax by effusion and atelectasis, underlying tumor not excluded; persistent infiltrate and probable pleural effusion at inferior left chest 11/6 - Now on BiPAP, continued respiratory failure, pigtail catheter drains slowly and intermittently, has been blocked in the past 11/7 - CXR: near complete opacification of right hemithorax with minimally improved central right lung aeration; stable left retrocardiac consolidation, favor atelectasis; fluffy left parahilar lung opacities, slightly worsened, favor pulmonary edema 11/7 - Using BiPAP at night. Off BiPAP she appears better but is hypoxemic on 15L nasal cannula with SpO2 79-85%; able to eat today and in less distress; large bore chest tube held off due to tenuous respiratory status and significant tumor infiltration into right chest wall. General surgery prefers thoracic surgery evaluation for possible VATS and large bore tube thoracostomy. This would be for palliative purposes only - patient and family aware of this and wish to proceed. Still full code. 11/8 - Patient switched to HF [place] and appears to be much more comfortable; sitting in bed eating breakfast this morning and reports no new complaints; Dr. Genevive Bi was by to discuss surgery  REVIEW OF SYSTEMS:  ROS limited due to critical illness. General: improved appetite, improved fatigue, unexpected weight change Respiratory: positive for cough and mild shortness of breath  Cardiovascular: negative for palpitations, chest pain, lower extremity swelling Gastrointestinal: negative for abdominal pain,  nausea/vomiting Musculoskeletal: movement exacerbates pain and coughing Neurological: negative for lightheadedness, syncope, headaches    PHYSICAL EXAMINATION:  GENERAL: frail, critically ill appearing, mild respiratory distress HEAD: Normocephalic, atraumatic.  EYES: Pupils equal, round, reactive to light.  No scleral icterus.  MOUTH: Moist mucosal membrane. NECK: Supple, no JVD PULMONARY: lungs clear to auscultation bilaterally; deep breaths provoke cough; tachypnea present CARDIOVASCULAR: S1 and S2. Tachycardia present. No murmurs, rubs, or gallops.  GASTROINTESTINAL: Soft, nontender, non-distended.  Positive bowel sounds.   MUSCULOSKELETAL: No swelling, clubbing, or edema.  NEUROLOGIC: no focal deficits present, alert and oriented x 3 PSYCHIATRIC: normal mood and affect SKIN: significant tumor infiltration from right breast with necrotic fungating mass  MEDICATIONS: I have reviewed all medications and confirmed regimen as documented   CULTURE RESULTS   Recent Results (from the past 240 hour(s))  Respiratory Panel by RT PCR (Flu A&B, Covid) - Nasopharyngeal Swab     Status: None   Collection Time: 06/15/20  9:25 PM   Specimen: Nasopharyngeal Swab  Result Value Ref Range Status   SARS Coronavirus 2 by RT PCR NEGATIVE NEGATIVE Final    Comment: (NOTE) SARS-CoV-2 target nucleic acids are NOT DETECTED.  The SARS-CoV-2 RNA is generally detectable in upper respiratoy specimens during the acute phase of infection. The lowest concentration of SARS-CoV-2 viral copies this assay can detect is 131 copies/mL. A negative result does not preclude SARS-Cov-2 infection and should not be used as the sole basis for treatment or other patient management decisions. A negative result may occur  with  improper specimen collection/handling, submission of specimen other than nasopharyngeal swab, presence of viral mutation(s) within the areas targeted by this assay, and inadequate number of viral  copies (<131 copies/mL). A negative result must be combined with clinical observations, patient history, and epidemiological information. The expected result is Negative.  Fact Sheet for Patients:  PinkCheek.be  Fact Sheet for Healthcare Providers:  GravelBags.it  This test is no t yet approved or cleared by the [place] FDA and  has been authorized for detection and/or diagnosis of SARS-CoV-2 by FDA under an Emergency Use Authorization (EUA). This EUA will remain  in effect (meaning this test can be used) for the duration of the COVID-19 declaration under Section 564(b)(1) of the Act, 21 U.S.C. section 360bbb-3(b)(1), unless the authorization is terminated or revoked sooner.     Influenza A by PCR NEGATIVE NEGATIVE Final   Influenza B by PCR NEGATIVE NEGATIVE Final    Comment: (NOTE) The Xpert Xpress SARS-CoV-2/FLU/RSV assay is intended as an aid in  the diagnosis of influenza from Nasopharyngeal swab specimens and  should not be used as a sole basis for treatment. Nasal washings and  aspirates are unacceptable for Xpert Xpress SARS-CoV-2/FLU/RSV  testing.  Fact Sheet for Patients: PinkCheek.be  Fact Sheet for Healthcare Providers: GravelBags.it  This test is not yet approved or cleared by the [place] FDA and  has been authorized for detection and/or diagnosis of SARS-CoV-2 by  FDA under an Emergency Use Authorization (EUA). This EUA will remain  in effect (meaning this test can be used) for the duration of the  Covid-19 declaration under Section 564(b)(1) of the Act, 21  U.S.C. section 360bbb-3(b)(1), unless the authorization is  terminated or revoked. Performed at [facility], [address] [place], [place] [address]   Blood Culture (routine x 2)     Status: None   Collection Time: 06/15/20 10:09 PM   Specimen: BLOOD  Result Value  Ref Range Status   Specimen Description BLOOD LEFT AC  Final   Special Requests   Final    BOTTLES DRAWN AEROBIC AND ANAEROBIC Blood Culture results may not be optimal due to an inadequate volume of blood received in culture bottles   Culture   Final    NO GROWTH 5 DAYS Performed at [facility], [address] [place], [place] [address]    Report Status 06/20/2020 FINAL  Final  Blood Culture (routine x 2)     Status: None   Collection Time: 06/15/20 10:11 PM   Specimen: BLOOD  Result Value Ref Range Status   Specimen Description BLOOD RIGHT [facility]  Final   Special Requests   Final    BOTTLES DRAWN AEROBIC AND ANAEROBIC Blood Culture adequate volume   Culture   Final    NO GROWTH 5 DAYS Performed at [facility], [address], [place], [place] [address]    Report Status 06/20/2020 FINAL  Final  Urine culture     Status: None   Collection Time: 06/15/20 11:40 PM   Specimen: Urine, Random  Result Value Ref Range Status   Specimen Description   Final    URINE, RANDOM Performed at [facility], [address] [place], [place] [address]    Special Requests   Final    NONE Performed at [facility], [address] [place], [place] [address]    Culture   Final    NO GROWTH Performed at [facility], [place]  [address], [place], [place] [address]    Report Status 06/17/2020 FINAL  Final  Pleural Fluid culture (includes gram stain)     Status: None   Collection Time: 06/15/20 11:40 PM   Specimen: Pleural Fluid  Result Value Ref Range Status   Specimen Description   Final    PLEURAL Performed at [facility], [address] [place], [place] [address]    Special Requests   Final    NONE Performed at [facility], [place], [place] [address]    Gram Stain NO WBC SEEN NO ORGANISMS SEEN   Final   Culture   Final    NO GROWTH 3 DAYS Performed at [facility], [place] [address] [place],  [place] [address]    Report Status 06/19/2020 FINAL  Final  MRSA PCR Screening     Status: None   Collection Time: 06/16/20 11:51 PM   Specimen: Nasopharyngeal  Result Value Ref Range Status   MRSA by PCR NEGATIVE NEGATIVE Final    Comment:        The GeneXpert MRSA Assay (FDA approved for NASAL specimens only), is one component of a comprehensive MRSA colonization surveillance program. It is not intended to diagnose MRSA infection nor to guide or monitor treatment for MRSA infections. Performed at [facility], [address] [place], [place] [address]     LABS:   CBC Latest Ref Rng & Units 06/20/2020 06/19/2020 06/18/2020  WBC 4.0 - 10.5 K/uL 11.7(H) 15.9(H) 16.5(H)  Hemoglobin 12.0 - 15.0 g/dL 9.7(L) 11.2(L) 10.1(L)  Hematocrit 36 - 46 % 31.1(L) 35.9(L) 31.6(L)  Platelets 150 - 400 K/uL 325 358 368   BMP Latest Ref Rng & Units 06/20/2020 06/19/2020 06/18/2020  Glucose 70 - 99 mg/dL 95 102(H) 106(H)  BUN 6 - 20 mg/dL 12 11 14   Creatinine 0.44 - 1.00 mg/dL 0.52 0.48 0.66  Sodium 135 - 145 mmol/L 138 136 134(L)  Potassium 3.5 - 5.1 mmol/L 4.3 4.1 4.1  Chloride 98 - 111 mmol/L 101 101 99  CO2 22 - 32 mmol/L 28 30 28   Calcium 8.9 - 10.3 mg/dL 8.5(L) 8.5(L) 8.7(L)    Intake/Output Summary (Last 24 hours) at 06/20/2020 1537 Last data filed at 06/20/2020 1431 Gross per 24 hour  Intake 240 ml  Output 655 ml  Net -415 ml    IMAGING   No results found.   Best Practice: Diet: Regular Diet Pain/Anxiety/Delirium protocol (if indicated): Prn Morphine VAP protocol (if indicated): N/A DVT prophylaxis: Lovenox GI prophylaxis: N/A Glucose control: N/A  Mobility: As tolerated  Code Status: full code                    ASSESSMENT AND PLAN SYNOPSIS 54 y.o. female presents with acute hypoxic respiratory failure in the setting of large right pleural effusion, which is likely malignant. Complex right breast mass is present infiltrating into right chest wall with  significant overlying cellulitic skin changes. Currently on HF [place] and scheduled to undergo VATS procedure with Dr. Genevive Bi on Thursday 06/23/2020.   ACUTE HYPOXIC RESPIRATORY FAILURE in the setting of large right pleural effusion (suspected malignant pleural effusion) - Status post thoracentesis and chest tube insertion - Initial concern for hemothorax, fluid from pigtail catheter was serosanguinous (though didn't appear to be fresh blood), drainage has slowed considerably, H&H has remained stable - thus suspicion is for malignant pleural effusion - In need of large bore thoracostomy with or without VATS (likely  will need to be done by thoracic surgery under intubation due to significant tumor infiltration into right chest wall) - Heated high flow oxygen by nasal cannula (appears to be working better than BiPAP) - Intubate if worse -> mechanically ventilate -> then CCM can place large bore chest tube - Scheduled for VATS procedure 06/23/2020 with Dr. Genevive Bi  RIGHT BREAST MASS (likely metastatic breast cancer with malignant pleural effusion) - Cytology pending, poor prognosis - Oncology following - Palliative care following  SEPSIS secondary to right breast cellulitis and possible HCAP - Continue IV antibiotics        ELECTROLYTES -follow labs as needed -replace as needed -pharmacy consultation and following   DVT/GI PRX ordered and assessed TRANSFUSIONS AS NEEDED MONITOR FSBS I Assessed the need for Labs I Assessed the need for Foley I Assessed the need for Central Venous Line Family Discussion when available I Assessed the need for Mobilization I made an Assessment of medications to be adjusted accordingly Safety Risk assessment completed   CASE DISCUSSED IN MULTIDISCIPLINARY ROUNDS WITH ICU TEAM  Critical Care Time devoted to patient care services described in this note is 45 minutes.   Overall, patient is critically ill, prognosis is guarded.   high risk for cardiac arrest  and death.    Corrin Parker, M.D.  [facility]  Medical Director [place] Director [facility] Cardio-Pulmonary Department

## 2020-06-20 NOTE — Progress Notes (Signed)
Patient ID: Jessica Willis, female   DOB: [birth year], 54 y.o.   MRN: [id number]  Long discussion with the patient and daughter today regarding the options for management of her pleural space.  The chest xray shows large right sided pleural effusion.  Stripped chest tube with minimal serous to serosanguinous fluid.  Discussed with Dr. Mortimer Fries.  One option would be VATS for talc pleurodesis and PleurX catheter.  Reviewed this option with patient and family and they would like to proceed.  Risks of bleeding, infection and death all reviewed.  She is aware that the lung may not completely expand and the procedure may not be as effective if such is the case.  Will plan for Thursday.  Patient and family in agreement.

## 2020-06-20 NOTE — Progress Notes (Signed)
PROGRESS NOTE    Jessica Willis  YQM:[id number] DOB: 12-May-1966 DOA: 06/15/2020 PCP: System, Provider Not In     Brief Narrative:  Jessica Willis is a 54 year old female with past medical history significant for anemia who presents with chief complaint of shortness of breath and cough.  She states that she has had some shortness of breath, especially worsening in the past week.  She also admits to having a large right breast mass that started about 6 months ago.  She has not sought any medical care for this.  In the emergency department, imaging revealed two separate right-sided breast masses, right axillary adenopathy, enlarged left axillary lymph node, large right-sided pleural effusion with mass-effect and left mediastinal shift, near collapse of the right lung.  She underwent thoracentesis with pigtail catheter placement in the emergency department.  Repeat chest x-ray revealed persistent right-sided pleural effusion.  She was started on IV antibiotics.  General surgery was consulted and additional 3.7 L of serosanguineous fluid was removed from her pleural space.  Cardiothoracic surgery was consulted to comanage the chest tube.  Oncology as well as palliative care were consulted.  Due to continued tachycardia and worsening respiratory status, patient was transferred to stepdown unit with ICU consult.  New events last 24 hours / Subjective: Patient was switched to HF [place] and appears to be much more comfortable this morning. She is sitting in bed, eating breakfast. She has no new complaints, states that Dr. Genevive Bi has been by and planning for surgery.   Assessment & Plan:   Principal Problem:   Sepsis ([place]) Active Problems:   Hypokalemia   Breast mass, right   Mastitis   Pleural effusion, right   Hemothorax on right   Goals of care, counseling/discussion   Chest tube in place   Large right pleural effusion, hemothorax  -CTA chest: Large right pleural effusion which appears to  have mass effect with complete collapse/compression of the right lung and shift of mediastinal structures to the left. -Status post thoracentesis with 500 cc serosanguineous fluid removed.  Pigtail catheter in place.  Hooked up to Pleur-evac with another 3.7 L removal 11/4. -Pleural fluid exudative, likely due to suspected malignancy. Pleural fluid culture negative. Cytology pending -Repeat chest x-ray post thoracentesis: placement of right pigtail catheter with persistent complete opacification of the right hemithorax. There may be slight diminished mediastinal shift to the left. No visualized pneumothorax. -General surgery and cardiothoracic surgery comanaging chest tube -VATS planned for this week   Right breast mass -CTA chest: One, possibly 2 separate large masses within the right breast extending to the skin surface with associated adjacent skin thickening and right axillary adenopathy. Findings compatible with right breast cancer. Single enlarged left axillary lymph node. -Cytology pending from thoracentesis -General surgery, oncology, palliative care medicine following  Acute hypoxemic respiratory failure -Secondary to pleural effusion as above -Continue nasal cannula O2 to maintain sat greater than 92%.  Remains on HF [place]  -Critical care following, high risk of decompensation   Sepsis secondary to right breast cellulitis, HCAP -Sepsis present on admission with tachycardia, tachypnea, leukocytosis -Concern for cellulitis overlying breast mass with purulent discharge -Continue linezolid (was notified by pharmacy that vanco is on shortage), cefepime      DVT prophylaxis: Lovenox  Code Status: Full code Family Communication: Daughter at bedside  Disposition Plan:  Status is: Inpatient  Remains inpatient appropriate because:Hemodynamically unstable, Ongoing diagnostic testing needed not appropriate for outpatient work up, IV treatments appropriate due to intensity of illness  or  inability to take PO and Inpatient level of care appropriate due to severity of illness   Dispo: The patient is from: Home              Anticipated d/c is to: TBD              Anticipated d/c date is: > 3 days              Patient currently is not medically stable to d/c.  Remains ill with large right pleural effusion, respiratory failure.  Remains on IV antibiotics. HF [place]. Planned for VATS this week.     Consultants:   General surgery  Palliative care medicine  Oncology  Cardiothoracic surgery  Critical care medicine  Procedures:   Thoracentesis in the ER 11/3  Antimicrobials:  Anti-infectives (From admission, onward)   Start     Dose/Rate Route Frequency Ordered Stop   06/19/20 2200  linezolid (ZYVOX) IVPB 600 mg        600 mg 300 mL/hr over 60 Minutes Intravenous Every 12 hours 06/19/20 1026     06/17/20 0815  ceFEPIme (MAXIPIME) 2 g in sodium chloride 0.9 % 100 mL IVPB        2 g 200 mL/hr over 30 Minutes Intravenous Every 8 hours 06/17/20 0718     06/16/20 2200  vancomycin (VANCOCIN) IVPB 1000 mg/200 mL premix  Status:  Discontinued        1,000 mg 200 mL/hr over 60 Minutes Intravenous Every 12 hours 06/16/20 1005 06/16/20 1108   06/16/20 2200  vancomycin (VANCOCIN) IVPB 1000 mg/200 mL premix        1,000 mg 200 mL/hr over 60 Minutes Intravenous Every 12 hours 06/16/20 1123 06/19/20 1029   06/16/20 0900  vancomycin (VANCOCIN) IVPB 1000 mg/200 mL premix  Status:  Discontinued        1,000 mg 200 mL/hr over 60 Minutes Intravenous Every 8 hours 06/16/20 0102 06/16/20 1005   06/16/20 0045  vancomycin (VANCOREADY) IVPB 2000 mg/400 mL        2,000 mg 200 mL/hr over 120 Minutes Intravenous  Once 06/16/20 0031 06/16/20 0307   06/16/20 0015  vancomycin (VANCOCIN) IVPB 1000 mg/200 mL premix  Status:  Discontinued        1,000 mg 200 mL/hr over 60 Minutes Intravenous  Once 06/16/20 0010 06/16/20 0031   06/16/20 0009  cefTRIAXone (ROCEPHIN) 2 g in sodium chloride 0.9 % 100  mL IVPB  Status:  Discontinued        2 g 200 mL/hr over 30 Minutes Intravenous Every 24 hours 06/16/20 0010 06/16/20 0034   06/15/20 2215  cefTRIAXone (ROCEPHIN) 2 g in sodium chloride 0.9 % 100 mL IVPB  Status:  Discontinued        2 g 200 mL/hr over 30 Minutes Intravenous Every 24 hours 06/15/20 2200 06/16/20 1108   06/15/20 2215  azithromycin (ZITHROMAX) 500 mg in sodium chloride 0.9 % 250 mL IVPB  Status:  Discontinued        500 mg 250 mL/hr over 60 Minutes Intravenous Every 24 hours 06/15/20 2200 06/16/20 1108       Objective: Vitals:   06/20/20 0400 06/20/20 0500 06/20/20 0600 06/20/20 0800  BP: 115/67 116/66 130/80   Pulse: (!) 123 (!) 122 (!) 124   Resp: (!) 32 (!) 31 (!) 35   Temp:    98.2 F (36.8 C)  TempSrc:    Axillary  SpO2: 95% 97% 96%  Weight:      Height:        Intake/Output Summary (Last 24 hours) at 06/20/2020 0911 Last data filed at 06/20/2020 0800 Gross per 24 hour  Intake 239.99 ml  Output 595 ml  Net -355.01 ml   Filed Weights   06/15/20 2114  Weight: 86.2 kg    Examination: General exam: Appears calm and comfortable  Respiratory system: Diminished base, +chest tube in place  Cardiovascular system: S1 & S2 heard, tachycardic, regular rhythm. No pedal edema. Gastrointestinal system: Abdomen is nondistended, soft and nontender. Normal bowel sounds heard. Central nervous system: Alert and oriented. Non focal exam. Speech clear  Extremities: Symmetric in appearance bilaterally  Psychiatry: Judgement and insight appear stable. Mood & affect appropriate.    Data Reviewed: I have personally reviewed following labs and imaging studies  CBC: Recent Labs  Lab 06/15/20 2125 06/15/20 2125 06/16/20 0034 06/16/20 1537 06/16/20 1636 06/17/20 0453 06/18/20 0529 06/19/20 0450 06/20/20 0506  WBC 15.1*   < > 13.0*  --   --  18.7* 16.5* 15.9* 11.7*  NEUTROABS 12.3*  --   --   --   --   --   --   --   --   HGB 11.5*   < > 10.3*   < > 14.6 14.7  10.1* 11.2* 9.7*  HCT 36.3   < > 33.5*   < > 46.0 46.9* 31.6* 35.9* 31.1*  MCV 82.1   < > 83.1  --   --  81.4 81.2 83.1 83.4  PLT 568*   < > 465*  --   --  438* 368 358 325   < > = values in this interval not displayed.   Basic Metabolic Panel: Recent Labs  Lab 06/15/20 2125 06/15/20 2125 06/16/20 0034 06/17/20 0453 06/18/20 0529 06/19/20 0450 06/20/20 0506  NA 143   < > 140 135 134* 136 138  K 3.3*   < > 3.3* 4.4 4.1 4.1 4.3  CL 101   < > 100 102 99 101 101  CO2 25   < > 27 19* 28 30 28   GLUCOSE 146*   < > 124* 119* 106* 102* 95  BUN 22*   < > 20 17 14 11 12   CREATININE 0.61   < > 0.56 0.75 0.66 0.48 0.52  CALCIUM 9.5   < > 9.1 9.0 8.7* 8.5* 8.5*  MG 2.2  --   --  2.0 1.9  --   --   PHOS  --   --   --   --  3.2  --   --    < > = values in this interval not displayed.   GFR: Estimated Creatinine Clearance: 94.8 mL/min (by C-G formula based on SCr of 0.52 mg/dL). Liver Function Tests: Recent Labs  Lab 06/15/20 2125 06/16/20 0034  AST 43* 38  ALT 29 27  ALKPHOS 107 102  BILITOT 1.0 0.8  PROT 9.0* 8.8*  ALBUMIN 4.0 3.9  3.9   No results for input(s): LIPASE, AMYLASE in the last 168 hours. No results for input(s): AMMONIA in the last 168 hours. Coagulation Profile: Recent Labs  Lab 06/16/20 0034  INR 1.2   Cardiac Enzymes: No results for input(s): CKTOTAL, CKMB, CKMBINDEX, TROPONINI in the last 168 hours. BNP (last 3 results) No results for input(s): PROBNP in the last 8760 hours. HbA1C: No results for input(s): HGBA1C in the last 72 hours. CBG: No results for input(s): GLUCAP in the last  168 hours. Lipid Profile: No results for input(s): CHOL, HDL, LDLCALC, TRIG, CHOLHDL, LDLDIRECT in the last 72 hours. Thyroid Function Tests: No results for input(s): TSH, T4TOTAL, FREET4, T3FREE, THYROIDAB in the last 72 hours. Anemia Panel: No results for input(s): VITAMINB12, FOLATE, FERRITIN, TIBC, IRON, RETICCTPCT in the last 72 hours. Sepsis Labs: Recent Labs  Lab  06/15/20 2125 06/15/20 2125 06/16/20 0034 06/16/20 0514 06/17/20 1105 06/18/20 0753  PROCALCITON 24.07  --  21.52  --   --   --   LATICACIDVEN 2.8*   < > 2.5* 2.3* 2.3* 1.6   < > = values in this interval not displayed.    Recent Results (from the past 240 hour(s))  Respiratory Panel by RT PCR (Flu A&B, Covid) - Nasopharyngeal Swab     Status: None   Collection Time: 06/15/20  9:25 PM   Specimen: Nasopharyngeal Swab  Result Value Ref Range Status   SARS Coronavirus 2 by RT PCR NEGATIVE NEGATIVE Final    Comment: (NOTE) SARS-CoV-2 target nucleic acids are NOT DETECTED.  The SARS-CoV-2 RNA is generally detectable in upper respiratoy specimens during the acute phase of infection. The lowest concentration of SARS-CoV-2 viral copies this assay can detect is 131 copies/mL. A negative result does not preclude SARS-Cov-2 infection and should not be used as the sole basis for treatment or other patient management decisions. A negative result may occur with  improper specimen collection/handling, submission of specimen other than nasopharyngeal swab, presence of viral mutation(s) within the areas targeted by this assay, and inadequate number of viral copies (<131 copies/mL). A negative result must be combined with clinical observations, patient history, and epidemiological information. The expected result is Negative.  Fact Sheet for Patients:  PinkCheek.be  Fact Sheet for Healthcare Providers:  GravelBags.it  This test is no t yet approved or cleared by the [place] FDA and  has been authorized for detection and/or diagnosis of SARS-CoV-2 by FDA under an Emergency Use Authorization (EUA). This EUA will remain  in effect (meaning this test can be used) for the duration of the COVID-19 declaration under Section 564(b)(1) of the Act, 21 U.S.C. section 360bbb-3(b)(1), unless the authorization is terminated or revoked  sooner.     Influenza A by PCR NEGATIVE NEGATIVE Final   Influenza B by PCR NEGATIVE NEGATIVE Final    Comment: (NOTE) The Xpert Xpress SARS-CoV-2/FLU/RSV assay is intended as an aid in  the diagnosis of influenza from Nasopharyngeal swab specimens and  should not be used as a sole basis for treatment. Nasal washings and  aspirates are unacceptable for Xpert Xpress SARS-CoV-2/FLU/RSV  testing.  Fact Sheet for Patients: PinkCheek.be  Fact Sheet for Healthcare Providers: GravelBags.it  This test is not yet approved or cleared by the [place] FDA and  has been authorized for detection and/or diagnosis of SARS-CoV-2 by  FDA under an Emergency Use Authorization (EUA). This EUA will remain  in effect (meaning this test can be used) for the duration of the  Covid-19 declaration under Section 564(b)(1) of the Act, 21  U.S.C. section 360bbb-3(b)(1), unless the authorization is  terminated or revoked. Performed at [facility], [address] [place], [place] [address]   Blood Culture (routine x 2)     Status: None   Collection Time: 06/15/20 10:09 PM   Specimen: BLOOD  Result Value Ref Range Status   Specimen Description BLOOD LEFT [facility]  Final   Special Requests   Final    BOTTLES DRAWN AEROBIC AND  ANAEROBIC Blood Culture results may not be optimal due to an inadequate volume of blood received in culture bottles   Culture   Final    NO GROWTH 5 DAYS Performed at [facility], [address] [place], [place] [address]    Report Status 06/20/2020 FINAL  Final  Blood Culture (routine x 2)     Status: None   Collection Time: 06/15/20 10:11 PM   Specimen: BLOOD  Result Value Ref Range Status   Specimen Description BLOOD RIGHT [facility] [place]  Final   Special Requests   Final    BOTTLES DRAWN AEROBIC AND ANAEROBIC Blood Culture adequate volume   Culture   Final    NO GROWTH 5 DAYS Performed at [facility], [address] [place], [place] [address]    Report Status 06/20/2020 FINAL  Final  Urine culture     Status: None   Collection Time: 06/15/20 11:40 PM   Specimen: Urine, Random  Result Value Ref Range Status   Specimen Description   Final    URINE, RANDOM Performed at [facility], [address], [place], [place] [address]    Special Requests   Final    NONE Performed at [facility], [address] [place], [place] [address]    Culture   Final    NO GROWTH Performed at [facility], [place] [address], [place], [place] [address]    Report Status 06/17/2020 FINAL  Final  Pleural Fluid culture (includes gram stain)     Status: None   Collection Time: 06/15/20 11:40 PM   Specimen: Pleural Fluid  Result Value Ref Range Status   Specimen Description   Final    PLEURAL Performed at [facility], [address], [place], [place] [address]    Special Requests   Final    NONE Performed at [facility], [place], [place] [address]    Gram Stain NO WBC SEEN NO ORGANISMS SEEN   Final   Culture   Final    NO GROWTH 3 DAYS Performed at [facility], [place] [address], [place], [place] [address]    Report Status 06/19/2020 FINAL  Final  MRSA PCR Screening     Status: None   Collection Time: 06/16/20 11:51 PM   Specimen: Nasopharyngeal  Result Value Ref Range Status   MRSA by PCR NEGATIVE NEGATIVE Final    Comment:        The GeneXpert MRSA Assay (FDA approved for NASAL specimens only), is one component of a comprehensive MRSA colonization surveillance program. It is not intended to diagnose MRSA infection nor to guide or monitor treatment for MRSA infections. Performed at [facility], [address] [place],  [address]       Radiology Studies: DG Chest [place] 1 View  Result Date: 06/19/2020 CLINICAL DATA:  Chest tube in place EXAM: PORTABLE CHEST 1 VIEW COMPARISON:  Chest radiograph  from one day prior. FINDINGS: Right basilar pigtail chest tube is stable. Stable cardiomediastinal silhouette with mild cardiomegaly. No pneumothorax. Near complete opacification of the right hemithorax with minimally improved central right lung aeration. Extensive fluffy left parahilar lung opacities, slightly worsened. Left retrocardiac consolidation is stable. IMPRESSION: 1. Stable right basilar chest tube without pneumothorax. 2. Near complete opacification of the right hemithorax with minimally improved central right lung aeration, which could be due to any combination of pleural effusion, atelectasis, pneumonia, edema or mass. 3. Stable left retrocardiac  consolidation, favor atelectasis. 4. Fluffy left parahilar lung opacities, slightly worsened, favor pulmonary edema. Electronically Signed   By: Ilona Sorrel M.D.   On: 06/19/2020 15:04   DG CHEST PORT 1 VIEW  Result Date: 06/18/2020 CLINICAL DATA:  Malignant pleural effusion, RIGHT-side chest tube EXAM: PORTABLE CHEST 1 VIEW COMPARISON:  Portable exam 1042 hours compared to 11 x 21 FINDINGS: Pigtail RIGHT thoracostomy tube again seen. Persistent subtotal opacification of the RIGHT hemithorax by effusion and atelectasis, underlying tumor not excluded. Atelectasis versus consolidation of LEFT lower lobe with probable small pleural effusion. Stable heart size. No acute osseous findings. IMPRESSION: Persistent subtotal opacification of the RIGHT hemithorax by effusion and atelectasis, underlying tumor not excluded. Persistent infiltrate and probable pleural effusion at inferior LEFT chest. Electronically Signed   By: Lavonia Dana M.D.   On: 06/18/2020 16:09      Scheduled Meds: . Chlorhexidine Gluconate Cloth  6 each Topical Q0600  . melatonin  5 mg Oral QHS  . sodium chloride flush  10-40 mL Intracatheter Q12H   Continuous Infusions: . ceFEPime (MAXIPIME) IV 2 g (06/20/20 6060)  . lactated ringers 10 mL/hr at 06/19/20 1200  . linezolid (ZYVOX) IV  600 mg (06/20/20 0236)     LOS: 5 days      Time spent: 35 minutes   Dessa Phi, DO [facility] 06/20/2020, 9:11 AM   Available via Epic secure chat 7am-7pm After these hours, please refer to coverage provider listed on [email or web address]

## 2020-06-21 DIAGNOSIS — J90 Pleural effusion, not elsewhere classified: Secondary | ICD-10-CM

## 2020-06-21 DIAGNOSIS — C50919 Malignant neoplasm of unspecified site of unspecified female breast: Secondary | ICD-10-CM

## 2020-06-21 DIAGNOSIS — A4189 Other specified sepsis: Secondary | ICD-10-CM

## 2020-06-21 DIAGNOSIS — C50911 Malignant neoplasm of unspecified site of right female breast: Secondary | ICD-10-CM

## 2020-06-21 DIAGNOSIS — J910 Malignant pleural effusion: Secondary | ICD-10-CM

## 2020-06-21 LAB — CBC
HCT: 30.7 % — ABNORMAL LOW (ref 36.0–46.0)
Hemoglobin: 9.4 g/dL — ABNORMAL LOW (ref 12.0–15.0)
MCH: 25.1 pg — ABNORMAL LOW (ref 26.0–34.0)
MCHC: 30.6 g/dL (ref 30.0–36.0)
MCV: 82.1 fL (ref 80.0–100.0)
Platelets: 317 10*3/uL (ref 150–400)
RBC: 3.74 MIL/uL — ABNORMAL LOW (ref 3.87–5.11)
RDW: 12.8 % (ref 11.5–15.5)
WBC: 9.4 10*3/uL (ref 4.0–10.5)
nRBC: 0 % (ref 0.0–0.2)

## 2020-06-21 LAB — BASIC METABOLIC PANEL
Anion gap: 8 (ref 5–15)
BUN: 9 mg/dL (ref 6–20)
CO2: 30 mmol/L (ref 22–32)
Calcium: 8.4 mg/dL — ABNORMAL LOW (ref 8.9–10.3)
Chloride: 100 mmol/L (ref 98–111)
Creatinine, Ser: 0.34 mg/dL — ABNORMAL LOW (ref 0.44–1.00)
GFR, Estimated: >60 mL/min (ref >60)
Glucose, Bld: 100 mg/dL — ABNORMAL HIGH (ref 70–99)
Potassium: 3.7 mmol/L (ref 3.5–5.1)
Sodium: 138 mmol/L (ref 135–145)

## 2020-06-21 MED ORDER — METHYLPREDNISOLONE SODIUM SUCC 40 MG IJ SOLR
20.0000 mg | Freq: Two times a day (BID) | INTRAMUSCULAR | Status: DC
  Administered 2020-06-21 – 2020-07-01 (×20): 20 mg via INTRAVENOUS
  Filled 2020-06-21 (×21): qty 1

## 2020-06-21 MED ORDER — IPRATROPIUM-ALBUTEROL 0.5-2.5 (3) MG/3ML IN SOLN
3.0000 mL | RESPIRATORY_TRACT | Status: DC
  Administered 2020-06-21 – 2020-06-24 (×17): 3 mL via RESPIRATORY_TRACT
  Filled 2020-06-21 (×17): qty 3

## 2020-06-21 MED ORDER — SIMETHICONE 80 MG PO CHEW
80.0000 mg | CHEWABLE_TABLET | Freq: Four times a day (QID) | ORAL | Status: DC | PRN
  Administered 2020-06-21 – 2020-07-01 (×4): 80 mg via ORAL
  Filled 2020-06-21 (×6): qty 1

## 2020-06-21 MED ORDER — BUDESONIDE 0.25 MG/2ML IN SUSP
0.2500 mg | Freq: Two times a day (BID) | RESPIRATORY_TRACT | Status: DC
  Administered 2020-06-21 – 2020-07-01 (×19): 0.25 mg via RESPIRATORY_TRACT
  Filled 2020-06-21 (×20): qty 2

## 2020-06-21 NOTE — Progress Notes (Signed)
CRITICAL CARE NOTE  Jessica Willis is a 54 y.o. African American female with a past medical history significant for anemia and a right pleural empyema strep pyogenes in 2016. She presented to [facility] ED on 06/15/2020 with complaints of shortness of breath and progressive cough for the last week. She also admits to a large mass on the right breast and skin changes that have been worsening over the last 6 months, though she never sought medical evaluation. Upon presentation, patient was noted to have a large right breast mass with a duplicate mass above the breast which looked necrotic with bullae and purulent discharge.  Workup in the ED revealed the following: Temp 98.2, BP 180/107, HR 144, RR 38, SpO2 94% on room air, sodium 143, potassium 3.3, chloride 101, CO2 25, BUN 22, creatinine 0.61, lactic acid 2.8, glucose 146, and calcium 9.5. Chest CT angiogram showed no evidence of PE. There was one or possibly two separate large masses seen on the right breast with extension into the skin surface and associated skin thickening and right axillary adenopathy. These findings are consistent with right breast cancer. Imaging also shows a large right pleural effusion with mass-effect, left shift of the mediastinal structures, and collapse of the right lung. Thoracentesis was performed in the ED with the insertion of a pigtail catheter, which yielded 500 cc of serosanguineous fluid. Cytology was sent 06/15/20.   She was admitted by the Hospitalist for further workup and treatment of acute hypoxic respiratory failure in the setting of large right pleural effusion (suspected malignant pleural effusion) due to right breast mass and sepsis in the setting of right breast cellulitis. On 06/16/2020, she became tachycardic, tachypneic, with worsening hypoxia. Follow up chest x-ray showed persistent complete opacification of the right hemithorax along with a slight mediastinal shift to the left. General surgery was consulted to  evaluate need for larger chest tube placement. General surgery placed pigtail to suction and drained 3.5L of dark serosanguinous fluid (did not appear like fresh blood). Cardiothoracic surgery was also consulted to ensure no concern for active bleeding or need for possible surgical intervention. Serial chest x-rays showed that the pleural effusion was improving, and output has slowed considerably. Oncology and Palliative Care have also been consulted.  CC  Follow up respiratory failure  SUBJECTIVE Patient remains critically ill Prognosis is guarded   BP 121/79   Pulse (!) 125   Temp 98.9 F (37.2 C) (Oral)   Resp (!) 31   Ht 5\' 10"  (1.778 m)   Wt 86.2 kg   LMP 06/16/2015 Comment: pt has had tubes tied   SpO2 93%   BMI 27.26 kg/m    I/O last 3 completed shifts: In: 2313.6 [P.O.:480; I.V.:383.6; IV Piggyback:1400] Out: 1290 [Urine:850; Chest Tube:440] Total I/O In: 154.8 [I.V.:54.8; IV Piggyback:100] Out: 0   SpO2: 93 % O2 Flow Rate (L/min): 50 L/min FiO2 (%): 85 %  Estimated body mass index is 27.26 kg/m as calculated from the following:   Height as of this encounter: 5\' 10"  (1.778 m).   Weight as of this encounter: 86.2 kg.  SIGNIFICANT EVENTS  11/3 - Right pigtail catheter inserted 11/3 - CTA chest: no evidence of PE; one or two large masses in right breast with extension to skin surface, compatible with right breast cancer; large right pleural effusion with mass effect and complete collapse/compression of right lung and left mediastinal shift; single enlarged left axillary lymph node 11/3 - CXR: right pigtail catheter placed with complete opacification of right  hemithorax; no visualized pneumothorax 11/4 - Venous US Left LE: no evidence of DVT 11/4 - CXR: small right pneumothorax with right-sided pleural effusion, pigtail catheter in place; interval decrease in a large right pleural fluid collection (effusion vs hemothorax); severe airspace and interstitial opacities  on the right, mild interstitial opacities on the left 11/5 - CXR: slightly improved, almost complete whiteout of right chest, slightly decreased size of pleural effusion; interval increase in interstitial and alveolar left lung opacities and possible trace left pleural effusion 11/5 - Currently on 8L [place]; hemodynamically stable on pressors, afebrile 11/6 - CXR: persistent subtotal opacification of right hemithorax by effusion and atelectasis, underlying tumor not excluded; persistent infiltrate and probable pleural effusion at inferior left chest 11/6 - Now on BiPAP, continued respiratory failure, pigtail catheter drains slowly and intermittently, has been blocked in the past 11/7 - CXR: near complete opacification of right hemithorax with minimally improved central right lung aeration; stable left retrocardiac consolidation, favor atelectasis; fluffy left parahilar lung opacities, slightly worsened, favor pulmonary edema 11/7 - Using BiPAP at night. Off BiPAP she appears better but is hypoxemic on 15L nasal cannula with SpO2 79-85%; able to eat today and in less distress; large bore chest tube held off due to tenuous respiratory status and significant tumor infiltration into right chest wall. General surgery prefers thoracic surgery evaluation for possible VATS and large bore tube thoracostomy. This would be for palliative purposes only - patient and family aware of this and wish to proceed. Still full code. 11/8 - Patient switched to HF [place] and appears to be much more comfortable; sitting in bed eating breakfast this morning and reports no new complaints; Dr. Genevive Bi was by to discuss surgery 11/9 - Patient is on HF [place] and does not report any new symptoms/concerns. Started bronchodilators and steroids. 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 VATS procedure scheduled for 06/23/20 with Dr. Genevive Bi.   REVIEW OF SYSTEMS  ROS limited due to critical illness. General: improved  appetite, improved fatigue, unexpected weight change Respiratory: positive for cough and mild shortness of breath  Cardiovascular: negative for palpitations, chest pain, lower extremity swelling Gastrointestinal: positive for gassiness; negative for abdominal pain, nausea/vomiting Musculoskeletal: movement exacerbates pain and coughing Neurological: negative for lightheadedness, syncope, headaches  PHYSICAL EXAMINATION  GENERAL: Frail, alert, calm and comfortable, no acute distress HEAD: Normocephalic, atraumatic.  EYES: Pupils equal, round, reactive to light.  No scleral icterus.  MOUTH: Moist mucosal membrane. NECK: Supple, no JVD PULMONARY: Lungs clear to auscultation bilaterally; deep breaths provoke cough; tachypnea present; chest tube with serosanguinous drainage CARDIOVASCULAR: S1 and S2. Tachycardia present. No murmurs, rubs, or gallops.  GASTROINTESTINAL: Soft, nontender, non-distended.  Positive bowel sounds.   MUSCULOSKELETAL: No swelling, clubbing, or edema.  NEUROLOGIC: No focal deficits present, alert and oriented x 3 PSYCHIATRIC: Normal mood and affect, not confused SKIN: Significant tumor infiltration from right breast with necrotic fungating mass, no active bleeding or purulent discharge  MEDICATIONS: I have reviewed all medications and confirmed regimen as documented   Scheduled Meds: . budesonide (PULMICORT) nebulizer solution  0.25 mg Nebulization BID  . Chlorhexidine Gluconate Cloth  6 each Topical Q0600  . ipratropium-albuterol  3 mL Nebulization Q4H  . melatonin  5 mg Oral QHS  . methylPREDNISolone (SOLU-MEDROL) injection  20 mg Intravenous BID  . sodium chloride flush  10-40 mL Intracatheter Q12H   Continuous Infusions: . ceFEPime (MAXIPIME) IV Stopped (06/21/20 0708)  . lactated ringers Stopped (06/21/20 0855)  . linezolid (ZYVOX) IV  Stopped (06/21/20 0957)   PRN Meds:.acetaminophen **OR** acetaminophen, LORazepam, metoprolol tartrate, morphine  injection, ondansetron **OR** ondansetron (ZOFRAN) IV, simethicone, sodium chloride flush, traMADol, traZODone   CULTURE RESULTS   Recent Results (from the past 240 hour(s))  Respiratory Panel by RT PCR (Flu A&B, Covid) - Nasopharyngeal Swab     Status: None   Collection Time: 06/15/20  9:25 PM   Specimen: Nasopharyngeal Swab  Result Value Ref Range Status   SARS Coronavirus 2 by RT PCR NEGATIVE NEGATIVE Final    Comment: (NOTE) SARS-CoV-2 target nucleic acids are NOT DETECTED.  The SARS-CoV-2 RNA is generally detectable in upper respiratoy specimens during the acute phase of infection. The lowest concentration of SARS-CoV-2 viral copies this assay can detect is 131 copies/mL. A negative result does not preclude SARS-Cov-2 infection and should not be used as the sole basis for treatment or other patient management decisions. A negative result may occur with  improper specimen collection/handling, submission of specimen other than nasopharyngeal swab, presence of viral mutation(s) within the areas targeted by this assay, and inadequate number of viral copies (<131 copies/mL). A negative result must be combined with clinical observations, patient history, and epidemiological information. The expected result is Negative.  Fact Sheet for Patients:  PinkCheek.be  Fact Sheet for Healthcare Providers:  GravelBags.it  This test is no t yet approved or cleared by the [place] FDA and  has been authorized for detection and/or diagnosis of SARS-CoV-2 by FDA under an Emergency Use Authorization (EUA). This EUA will remain  in effect (meaning this test can be used) for the duration of the COVID-19 declaration under Section 564(b)(1) of the Act, 21 U.S.C. section 360bbb-3(b)(1), unless the authorization is terminated or revoked sooner.     Influenza A by PCR NEGATIVE NEGATIVE Final   Influenza B by PCR NEGATIVE NEGATIVE Final     Comment: (NOTE) The Xpert Xpress SARS-CoV-2/FLU/RSV assay is intended as an aid in  the diagnosis of influenza from Nasopharyngeal swab specimens and  should not be used as a sole basis for treatment. Nasal washings and  aspirates are unacceptable for Xpert Xpress SARS-CoV-2/FLU/RSV  testing.  Fact Sheet for Patients: PinkCheek.be  Fact Sheet for Healthcare Providers: GravelBags.it  This test is not yet approved or cleared by the [place] FDA and  has been authorized for detection and/or diagnosis of SARS-CoV-2 by  FDA under an Emergency Use Authorization (EUA). This EUA will remain  in effect (meaning this test can be used) for the duration of the  Covid-19 declaration under Section 564(b)(1) of the Act, 21  U.S.C. section 360bbb-3(b)(1), unless the authorization is  terminated or revoked. Performed at [facility], [address] [place], [place] [address]   Blood Culture (routine x 2)     Status: None   Collection Time: 06/15/20 10:09 PM   Specimen: BLOOD  Result Value Ref Range Status   Specimen Description BLOOD LEFT AC  Final   Special Requests   Final    BOTTLES DRAWN AEROBIC AND ANAEROBIC Blood Culture results may not be optimal due to an inadequate volume of blood received in culture bottles   Culture   Final    NO GROWTH 5 DAYS Performed at [facility], [address], [place], [place] [address]    Report Status 06/20/2020 FINAL  Final  Blood Culture (routine x 2)     Status: None   Collection Time: 06/15/20 10:11 PM   Specimen: BLOOD  Result Value Ref Range  Status   Specimen Description BLOOD RIGHT [facility]  Final   Special Requests   Final    BOTTLES DRAWN AEROBIC AND ANAEROBIC Blood Culture adequate volume   Culture   Final    NO GROWTH 5 DAYS Performed at [facility], [address] [place], [place] [address]    Report Status 06/20/2020 FINAL  Final  Urine culture      Status: None   Collection Time: 06/15/20 11:40 PM   Specimen: Urine, Random  Result Value Ref Range Status   Specimen Description   Final    URINE, RANDOM Performed at [facility], [address], [place], [place] [address]    Special Requests   Final    NONE Performed at [facility], [address] [place], [place] [address]    Culture   Final    NO GROWTH Performed at [facility], [place] [address] [place], [place] [address]    Report Status 06/17/2020 FINAL  Final  Pleural Fluid culture (includes gram stain)     Status: None   Collection Time: 06/15/20 11:40 PM   Specimen: Pleural Fluid  Result Value Ref Range Status   Specimen Description   Final    PLEURAL Performed at [facility], [address], [place], [place] [address]    Special Requests   Final    NONE Performed at [facility], [place], [place] [address]    Gram Stain NO WBC SEEN NO ORGANISMS SEEN   Final   Culture   Final    NO GROWTH 3 DAYS Performed at [facility], [place] [address] [place], [place] [address]    Report Status 06/19/2020 FINAL  Final  MRSA PCR Screening     Status: None   Collection Time: 06/16/20 11:51 PM   Specimen: Nasopharyngeal  Result Value Ref Range Status   MRSA by PCR NEGATIVE NEGATIVE Final    Comment:        The GeneXpert MRSA Assay (FDA approved for NASAL specimens only), is one component of a comprehensive MRSA colonization surveillance program. It is not intended to diagnose MRSA infection nor to guide or monitor treatment for MRSA infections. Performed at [facility], [address] [place], [place] [address]   Aerobic/Anaerobic Culture (surgical/deep wound)     Status: None (Preliminary result)   Collection Time: 06/20/20  3:20 PM   Specimen: Wound  Result Value Ref Range Status   Specimen Description   Final    WOUND Performed at [facility], [address] [place] [address],  [place], [place] [address]    Special Requests   Final    RIGHT BREAST Performed at [facility], [place], [place] [address]    Gram Stain   Final    RARE WBC PRESENT,BOTH PMN AND MONONUCLEAR NO ORGANISMS SEEN    Culture   Final    NO GROWTH < 24 HOURS Performed at [facility], [place] [address], [place], [place] [address]    Report Status PENDING  Incomplete     LABS   CBC Latest Ref Rng & Units 06/21/2020 06/20/2020 06/19/2020  WBC 4.0 - 10.5 K/uL 9.4 11.7(H) 15.9(H)  Hemoglobin 12.0 - 15.0 g/dL 9.4(L) 9.7(L) 11.2(L)  Hematocrit 36 - 46 % 30.7(L) 31.1(L) 35.9(L)  Platelets 150 - 400 K/uL 317 325 358    BMP Latest Ref Rng & Units 06/21/2020 06/20/2020 06/19/2020  Glucose 70 -  99 mg/dL 100(H) 95 102(H)  BUN 6 - 20 mg/dL 9 12 11   Creatinine 0.44 - 1.00 mg/dL 0.34(L) 0.52 0.48  Sodium 135 - 145 mmol/L 138 138 136  Potassium 3.5 - 5.1 mmol/L 3.7 4.3 4.1  Chloride 98 - 111 mmol/L 100 101 101  CO2 22 - 32 mmol/L 30 28 30   Calcium 8.9 - 10.3 mg/dL 8.4(L) 8.5(L) 8.5(L)    Intake/Output Summary (Last 24 hours) at 06/21/2020 1129 Last data filed at 06/21/2020 1124 Gross per 24 hour  Intake 2652.85 ml  Output 735 ml  Net 1917.85 ml    IMAGING   No results found.  Best Practice: Diet: Regular Diet Pain/Anxiety/Delirium protocol (if indicated): Prn Morphine VAP protocol (if indicated): N/A DVT prophylaxis: Lovenox GI prophylaxis: N/A Glucose control: N/A  Mobility: As tolerated  Code Status: full code   ASSESSMENT AND PLAN SYNOPSIS 54 y.o. female presents with acute hypoxic respiratory failure in the setting of large right malignant pleural effusion. Complex right breast mass is present infiltrating into right chest wall with significant overlying cellulitic skin changes. Cytology reporting metastatic adenocarcinoma. Currently on HF [place] and scheduled to undergo VATS procedure with Dr. Genevive Bi on Thursday 06/23/2020.  ACUTE HYPOXIC RESPIRATORY FAILURE  in the setting of large right malignant pleural effusion  - Status post thoracentesis and chest tube insertion - Initial concern for hemothorax, fluid from pigtail catheter was serosanguinous (though didn't appear to be fresh blood), drainage has slowed considerably, H&H has remained stable - In need of large bore thoracostomy with or without VATS (likely will need to be done by thoracic surgery under intubation due to significant tumor infiltration into right chest wall) - Heated high flow oxygen by nasal cannula (appears to be working better than BiPAP) - Start bronchodilators and Solumedrol - Intubate if worse -> mechanically ventilate -> then CCM can place large bore chest tube - Scheduled for VATS procedure 06/23/2020 with Dr. Genevive Bi  RIGHT BREAST MASS - Poor prognosis - Cytology positive for malignancy (metastatic adenocarcinoma, favor mammary primary) - General surgery signed off 06/20/2020 - Oncology following - Palliative care following  SEPSIS secondary to right breast cellulitis - Sepsis present on admission with tachycardia, tachypnea, leukocytosis -completed IV antibiotics   ELECTROLYTES - Follow labs as needed - Replace as needed - Pharmacy consultation and following   DVT/GI PRX ordered and assessed TRANSFUSIONS AS NEEDED MONITOR FSBS I Assessed the need for Labs I Assessed the need for Foley I Assessed the need for Central Venous Line Family Discussion when available I Assessed the need for Mobilization I made an Assessment of medications to be adjusted accordingly Safety Risk assessment completed   CASE DISCUSSED IN MULTIDISCIPLINARY ROUNDS WITH ICU TEAM  Critical Care Time devoted to patient care services described in this note is 45 minutes.   Overall, patient is critically ill, prognosis is guarded.     Corrin Parker, M.D.  [facility]  Medical Director [place] Director [facility] Cardio-Pulmonary  Department

## 2020-06-21 NOTE — Progress Notes (Signed)
End of shift summary: Pt has had a good day. Pt remains tachycardic, especially during coughing spells. IV lopressor given once today for HR sustained at 130. Relief noted but HR is back up to 126 now. Pt denies any pain and is afebrile. Chest tube has drained about 13ml today. Pt is on heated high flow on 50L at 72%FIO2. Pt has updated family today. Right breast mass continues to drain, dressing in place.

## 2020-06-21 NOTE — Progress Notes (Signed)
PROGRESS NOTE    Jessica Willis  TKP:[id number] DOB: 07/31/1966 DOA: 06/15/2020 PCP: System, Provider Not In     Brief Narrative:  Jessica Willis is a 54 year old female with past medical history significant for anemia who presents with chief complaint of shortness of breath and cough.  She states that she has had some shortness of breath, especially worsening in the past week.  She also admits to having a large right breast mass that started about 6 months ago.  She has not sought any medical care for this.  In the emergency department, imaging revealed two separate right-sided breast masses, right axillary adenopathy, enlarged left axillary lymph node, large right-sided pleural effusion with mass-effect and left mediastinal shift, near collapse of the right lung.  She underwent thoracentesis with pigtail catheter placement in the emergency department.  Repeat chest x-ray revealed persistent right-sided pleural effusion.  She was started on IV antibiotics.  General surgery was consulted and additional 3.7 L of serosanguineous fluid was removed from her pleural space.  Cardiothoracic surgery was consulted to comanage the chest tube.  Oncology as well as palliative care were consulted.  Due to continued tachycardia and worsening respiratory status, patient was transferred to stepdown unit with ICU consult.  New events last 24 hours / Subjective: Sitting in bed eating breakfast, remains on high flow nasal cannula.  No new complaints today.  Assessment & Plan:   Principal Problem:   Sepsis ([place]) Active Problems:   Hypokalemia   Breast mass, right   Mastitis   Pleural effusion, right   Hemothorax on right   Goals of care, counseling/discussion   Chest tube in place   Large right pleural effusion, hemothorax  -CTA chest: Large right pleural effusion which appears to have mass effect with complete collapse/compression of the right lung and shift of mediastinal structures to the  left. -Status post thoracentesis with 500 cc serosanguineous fluid removed.  Pigtail catheter in place.  Hooked up to Pleur-evac with another 3.7 L removal 11/4. -Pleural fluid exudative. Pleural fluid culture negative. Cytology positive for malignancy, metastatic adenocarcinoma, favor mammary primary -Repeat chest x-ray post thoracentesis: placement of right pigtail catheter with persistent complete opacification of the right hemithorax. There may be slight diminished mediastinal shift to the left. No visualized pneumothorax. -Cardiothoracic surgery chest tube -VATS planned for 11/11  Right breast mass -CTA chest: One, possibly 2 separate large masses within the right breast extending to the skin surface with associated adjacent skin thickening and right axillary adenopathy. Findings compatible with right breast cancer. Single enlarged left axillary lymph node. -Cytology positive for malignancy, metastatic adenocarcinoma, favor mammary primary -General surgery signed off 11/8 -Oncology, palliative care medicine following  Acute hypoxemic respiratory failure -Secondary to pleural effusion as above -Continue nasal cannula O2 to maintain sat greater than 92%.  Remains on HF [place]  -Critical care following, high risk of decompensation   Sepsis secondary to right breast cellulitis, HCAP -Sepsis present on admission with tachycardia, tachypnea, leukocytosis -Concern for cellulitis overlying breast mass with purulent discharge -Continue linezolid (was notified by pharmacy that vanco is on shortage), cefepime      DVT prophylaxis: Lovenox  Code Status: Full code Family Communication: No family at bedside  Disposition Plan:  Status is: Inpatient  Remains inpatient appropriate because:Hemodynamically unstable, Ongoing diagnostic testing needed not appropriate for outpatient work up, IV treatments appropriate due to intensity of illness or inability to take PO and Inpatient level of care  appropriate due to severity of illness  Dispo: The patient is from: Home              Anticipated d/c is to: TBD              Anticipated d/c date is: > 3 days              Patient currently is not medically stable to d/c.  Remains ill with large right pleural effusion, respiratory failure.  Remains on IV antibiotics. HF [place]. Planned for VATS this week.      Consultants:   General surgery  Palliative care medicine  Oncology  Cardiothoracic surgery  Critical care medicine  Procedures:   Thoracentesis in the ER 11/3  Antimicrobials:  Anti-infectives (From admission, onward)   Start     Dose/Rate Route Frequency Ordered Stop   06/19/20 2200  linezolid (ZYVOX) IVPB 600 mg        600 mg 300 mL/hr over 60 Minutes Intravenous Every 12 hours 06/19/20 1026     06/17/20 0815  ceFEPIme (MAXIPIME) 2 g in sodium chloride 0.9 % 100 mL IVPB        2 g 200 mL/hr over 30 Minutes Intravenous Every 8 hours 06/17/20 0718     06/16/20 2200  vancomycin (VANCOCIN) IVPB 1000 mg/200 mL premix  Status:  Discontinued        1,000 mg 200 mL/hr over 60 Minutes Intravenous Every 12 hours 06/16/20 1005 06/16/20 1108   06/16/20 2200  vancomycin (VANCOCIN) IVPB 1000 mg/200 mL premix        1,000 mg 200 mL/hr over 60 Minutes Intravenous Every 12 hours 06/16/20 1123 06/19/20 1029   06/16/20 0900  vancomycin (VANCOCIN) IVPB 1000 mg/200 mL premix  Status:  Discontinued        1,000 mg 200 mL/hr over 60 Minutes Intravenous Every 8 hours 06/16/20 0102 06/16/20 1005   06/16/20 0045  vancomycin (VANCOREADY) IVPB 2000 mg/400 mL        2,000 mg 200 mL/hr over 120 Minutes Intravenous  Once 06/16/20 0031 06/16/20 0307   06/16/20 0015  vancomycin (VANCOCIN) IVPB 1000 mg/200 mL premix  Status:  Discontinued        1,000 mg 200 mL/hr over 60 Minutes Intravenous  Once 06/16/20 0010 06/16/20 0031   06/16/20 0009  cefTRIAXone (ROCEPHIN) 2 g in sodium chloride 0.9 % 100 mL IVPB  Status:  Discontinued        2 g 200  mL/hr over 30 Minutes Intravenous Every 24 hours 06/16/20 0010 06/16/20 0034   06/15/20 2215  cefTRIAXone (ROCEPHIN) 2 g in sodium chloride 0.9 % 100 mL IVPB  Status:  Discontinued        2 g 200 mL/hr over 30 Minutes Intravenous Every 24 hours 06/15/20 2200 06/16/20 1108   06/15/20 2215  azithromycin (ZITHROMAX) 500 mg in sodium chloride 0.9 % 250 mL IVPB  Status:  Discontinued        500 mg 250 mL/hr over 60 Minutes Intravenous Every 24 hours 06/15/20 2200 06/16/20 1108       Objective: Vitals:   06/21/20 1020 06/21/20 1100 06/21/20 1123 06/21/20 1200  BP:  121/79  121/79  Pulse:  (!) 120 (!) 125 (!) 126  Resp:  (!) 30 (!) 31 (!) 24  Temp:      TempSrc:      SpO2: 92% 95% 93% 93%  Weight:      Height:        Intake/Output Summary (Last 24 hours) at 06/21/2020  [place] filed at 06/21/2020 1124 Gross per 24 hour  Intake 2652.85 ml  Output 735 ml  Net 1917.85 ml   Filed Weights   06/15/20 2114  Weight: 86.2 kg    Examination: General exam: Appears calm and comfortable  Respiratory system: Diminished breath sounds, without conversational dyspnea Cardiovascular system: S1 & S2 heard, tachycardic, regular rhythm. No pedal edema. Gastrointestinal system: Abdomen is nondistended, soft and nontender. Normal bowel sounds heard. Central nervous system: Alert and oriented. Non focal exam. Speech clear  Extremities: Symmetric in appearance bilaterally  Psychiatry: Judgement and insight appear stable. Mood & affect appropriate.    Data Reviewed: I have personally reviewed following labs and imaging studies  CBC: Recent Labs  Lab 06/15/20 2125 06/16/20 0034 06/17/20 0453 06/18/20 0529 06/19/20 0450 06/20/20 0506 06/21/20 0636  WBC 15.1*   < > 18.7* 16.5* 15.9* 11.7* 9.4  NEUTROABS 12.3*  --   --   --   --   --   --   HGB 11.5*   < > 14.7 10.1* 11.2* 9.7* 9.4*  HCT 36.3   < > 46.9* 31.6* 35.9* 31.1* 30.7*  MCV 82.1   < > 81.4 81.2 83.1 83.4 82.1  PLT 568*   < >  438* 368 358 325 317   < > = values in this interval not displayed.   Basic Metabolic Panel: Recent Labs  Lab 06/15/20 2125 06/16/20 0034 06/17/20 0453 06/18/20 0529 06/19/20 0450 06/20/20 0506 06/21/20 0636  NA 143   < > 135 134* 136 138 138  K 3.3*   < > 4.4 4.1 4.1 4.3 3.7  CL 101   < > 102 99 101 101 100  CO2 25   < > 19* 28 30 28 30   GLUCOSE 146*   < > 119* 106* 102* 95 100*  BUN 22*   < > 17 14 11 12 9   CREATININE 0.61   < > 0.75 0.66 0.48 0.52 0.34*  CALCIUM 9.5   < > 9.0 8.7* 8.5* 8.5* 8.4*  MG 2.2  --  2.0 1.9  --   --   --   PHOS  --   --   --  3.2  --   --   --    < > = values in this interval not displayed.   GFR: Estimated Creatinine Clearance: 94.8 mL/min (A) (by C-G formula based on SCr of 0.34 mg/dL (L)). Liver Function Tests: Recent Labs  Lab 06/15/20 2125 06/16/20 0034  AST 43* 38  ALT 29 27  ALKPHOS 107 102  BILITOT 1.0 0.8  PROT 9.0* 8.8*  ALBUMIN 4.0 3.9  3.9   No results for input(s): LIPASE, AMYLASE in the last 168 hours. No results for input(s): AMMONIA in the last 168 hours. Coagulation Profile: Recent Labs  Lab 06/16/20 0034  INR 1.2   Cardiac Enzymes: No results for input(s): CKTOTAL, CKMB, CKMBINDEX, TROPONINI in the last 168 hours. BNP (last 3 results) No results for input(s): PROBNP in the last 8760 hours. HbA1C: No results for input(s): HGBA1C in the last 72 hours. CBG: No results for input(s): GLUCAP in the last 168 hours. Lipid Profile: No results for input(s): CHOL, HDL, LDLCALC, TRIG, CHOLHDL, LDLDIRECT in the last 72 hours. Thyroid Function Tests: No results for input(s): TSH, T4TOTAL, FREET4, T3FREE, THYROIDAB in the last 72 hours. Anemia Panel: No results for input(s): VITAMINB12, FOLATE, FERRITIN, TIBC, IRON, RETICCTPCT in the last 72 hours. Sepsis Labs: Recent Labs  Lab 06/15/20 2125 06/15/20 2125 06/16/20 0034 06/16/20 0514 06/17/20 1105 06/18/20 0753  PROCALCITON 24.07  --  21.52  --   --   --    LATICACIDVEN 2.8*   < > 2.5* 2.3* 2.3* 1.6   < > = values in this interval not displayed.    Recent Results (from the past 240 hour(s))  Respiratory Panel by RT PCR (Flu A&B, Covid) - Nasopharyngeal Swab     Status: None   Collection Time: 06/15/20  9:25 PM   Specimen: Nasopharyngeal Swab  Result Value Ref Range Status   SARS Coronavirus 2 by RT PCR NEGATIVE NEGATIVE Final    Comment: (NOTE) SARS-CoV-2 target nucleic acids are NOT DETECTED.  The SARS-CoV-2 RNA is generally detectable in upper respiratoy specimens during the acute phase of infection. The lowest concentration of SARS-CoV-2 viral copies this assay can detect is 131 copies/mL. A negative result does not preclude SARS-Cov-2 infection and should not be used as the sole basis for treatment or other patient management decisions. A negative result may occur with  improper specimen collection/handling, submission of specimen other than nasopharyngeal swab, presence of viral mutation(s) within the areas targeted by this assay, and inadequate number of viral copies (<131 copies/mL). A negative result must be combined with clinical observations, patient history, and epidemiological information. The expected result is Negative.  Fact Sheet for Patients:  PinkCheek.be  Fact Sheet for Healthcare Providers:  GravelBags.it  This test is no t yet approved or cleared by the [place] FDA and  has been authorized for detection and/or diagnosis of SARS-CoV-2 by FDA under an Emergency Use Authorization (EUA). This EUA will remain  in effect (meaning this test can be used) for the duration of the COVID-19 declaration under Section 564(b)(1) of the Act, 21 U.S.C. section 360bbb-3(b)(1), unless the authorization is terminated or revoked sooner.     Influenza A by PCR NEGATIVE NEGATIVE Final   Influenza B by PCR NEGATIVE NEGATIVE Final    Comment: (NOTE) The Xpert Xpress  SARS-CoV-2/FLU/RSV assay is intended as an aid in  the diagnosis of influenza from Nasopharyngeal swab specimens and  should not be used as a sole basis for treatment. Nasal washings and  aspirates are unacceptable for Xpert Xpress SARS-CoV-2/FLU/RSV  testing.  Fact Sheet for Patients: PinkCheek.be  Fact Sheet for Healthcare Providers: GravelBags.it  This test is not yet approved or cleared by the [place] FDA and  has been authorized for detection and/or diagnosis of SARS-CoV-2 by  FDA under an Emergency Use Authorization (EUA). This EUA will remain  in effect (meaning this test can be used) for the duration of the  Covid-19 declaration under Section 564(b)(1) of the Act, 21  U.S.C. section 360bbb-3(b)(1), unless the authorization is  terminated or revoked. Performed at [facility], [address] [place], [place] [address]   Blood Culture (routine x 2)     Status: None   Collection Time: 06/15/20 10:09 PM   Specimen: BLOOD  Result Value Ref Range Status   Specimen Description BLOOD LEFT AC  Final   Special Requests   Final    BOTTLES DRAWN AEROBIC AND ANAEROBIC Blood Culture results may not be optimal due to an inadequate volume of blood received in culture bottles   Culture   Final    NO GROWTH 5 DAYS Performed at [facility], [address] [place],  [address]    Report Status 06/20/2020 FINAL  Final  Blood Culture (routine x  2)     Status: None   Collection Time: 06/15/20 10:11 PM   Specimen: BLOOD  Result Value Ref Range Status   Specimen Description BLOOD RIGHT [facility]  Final   Special Requests   Final    BOTTLES DRAWN AEROBIC AND ANAEROBIC Blood Culture adequate volume   Culture   Final    NO GROWTH 5 DAYS Performed at [facility], [address] [place], [place] [address]    Report Status 06/20/2020 FINAL  Final  Urine culture     Status: None   Collection Time:  06/15/20 11:40 PM   Specimen: Urine, Random  Result Value Ref Range Status   Specimen Description   Final    URINE, RANDOM Performed at [facility], [address] [place], [place] [address]    Special Requests   Final    NONE Performed at [facility], [address], [place], [place] [address]    Culture   Final    NO GROWTH Performed at [facility], [place] [address], [place], [place] [address]    Report Status 06/17/2020 FINAL  Final  Pleural Fluid culture (includes gram stain)     Status: None   Collection Time: 06/15/20 11:40 PM   Specimen: Pleural Fluid  Result Value Ref Range Status   Specimen Description   Final    PLEURAL Performed at [facility], [address] [place], [place] [address]    Special Requests   Final    NONE Performed at [facility], [place], [place] [address]    Gram Stain NO WBC SEEN NO ORGANISMS SEEN   Final   Culture   Final    NO GROWTH 3 DAYS Performed at [facility], [place] [address], [place], [place] [address]    Report Status 06/19/2020 FINAL  Final  MRSA PCR Screening     Status: None   Collection Time: 06/16/20 11:51 PM   Specimen: Nasopharyngeal  Result Value Ref Range Status   MRSA by PCR NEGATIVE NEGATIVE Final    Comment:        The GeneXpert MRSA Assay (FDA approved for NASAL specimens only), is one component of a comprehensive MRSA colonization surveillance program. It is not intended to diagnose MRSA infection nor to guide or monitor treatment for MRSA infections. Performed at [facility], [address] [place], [place] [address]   Aerobic/Anaerobic Culture (surgical/deep wound)     Status: None (Preliminary result)   Collection Time: 06/20/20  3:20 PM   Specimen: Wound  Result Value Ref Range Status   Specimen Description   Final    WOUND Performed at [facility], [address] [place], [place] [address]    Special  Requests   Final    RIGHT BREAST Performed at [facility], [place], [place] [address]    Gram Stain   Final    RARE WBC PRESENT,BOTH PMN AND MONONUCLEAR NO ORGANISMS SEEN    Culture   Final    NO GROWTH < 24 HOURS Performed at [facility], [place] [address] [place], [place] [address]    Report Status PENDING  Incomplete      Radiology Studies: No results found.    Scheduled Meds: . budesonide (PULMICORT) nebulizer solution  0.25 mg Nebulization BID  . Chlorhexidine Gluconate Cloth  6 each Topical Q0600  . ipratropium-albuterol  3 mL Nebulization Q4H  .  melatonin  5 mg Oral QHS  . methylPREDNISolone (SOLU-MEDROL) injection  20 mg Intravenous BID  . sodium chloride flush  10-40 mL Intracatheter Q12H   Continuous Infusions: . ceFEPime (MAXIPIME) IV Stopped (06/21/20 0708)  . lactated ringers Stopped (06/21/20 0855)  . linezolid (ZYVOX) IV Stopped (06/21/20 0957)     LOS: 6 days      Time spent: 25 minutes   Dessa Phi, DO [facility] 06/21/2020, 12:35 PM   Available via Epic secure chat 7am-7pm After these hours, please refer to coverage provider listed on [email or web address]

## 2020-06-21 NOTE — Progress Notes (Signed)
Hematology/Oncology Progress Note [facility] [phone] [phone]  Patient Care Team: System, Provider Not In as PCP - General   Name of the patient: Jessica Willis  [id number]  1966-05-04  Date of visit: 06/21/20   INTERVAL HISTORY-  + chest tube.  Remains in ICU due to acute respiratory failure. No family members at bedside.  Review of systems- Review of Systems  Unable to perform ROS: Severe respiratory distress  Constitutional: Positive for appetite change, fatigue and unexpected weight change.  Respiratory: Positive for cough and shortness of breath.   Cardiovascular: Negative for leg swelling.  Gastrointestinal: Negative for abdominal pain.  Skin: Negative for rash.  Neurological: Negative for light-headedness.  Psychiatric/Behavioral: Negative for confusion.    No Known Allergies  Patient Active Problem List   Diagnosis Date Noted  . Chest tube in place   . Goals of care, counseling/discussion   . Hemothorax on right 06/16/2020  . Acute respiratory failure with hypoxia ([place])   . Breast mass, right 06/15/2020  . Mastitis 06/15/2020  . Pleural effusion, right 06/15/2020  . Intestinal obstruction ([place])   . Leukocytosis   . Hypokalemia 06/24/2015  . Sepsis ([place]) 06/23/2015  . CAP (community acquired pneumonia) 06/23/2015  . Partial small bowel obstruction ([place]) 06/23/2015     Past Medical History:  Diagnosis Date  . Anemia   . Patient denies medical problems      Past Surgical History:  Procedure Laterality Date  . TUBAL LIGATION      Social History   Socioeconomic History  . Marital status: Single    Spouse name: Not on file  . Number of children: Not on file  . Years of education: Not on file  . Highest education level: Not on file  Occupational History  . Not on file  Tobacco Use  . Smoking status: Never Smoker  . Smokeless tobacco: Never Used  Substance and Sexual Activity  . Alcohol  use: Not Currently    Alcohol/week: 1.0 standard drink    Types: 1 Cans of beer per week  . Drug use: No  . Sexual activity: Not on file  Other Topics Concern  . Not on file  Social History Narrative  . Not on file   Social Determinants of Health   Financial Resource Strain:   . Difficulty of Paying Living Expenses: Not on file  Food Insecurity:   . Worried About Charity fundraiser in the Last Year: Not on file  . Ran Out of Food in the Last Year: Not on file  Transportation Needs:   . Lack of Transportation (Medical): Not on file  . Lack of Transportation (Non-Medical): Not on file  Physical Activity:   . Days of Exercise per Week: Not on file  . Minutes of Exercise per Session: Not on file  Stress:   . Feeling of Stress : Not on file  Social Connections:   . Frequency of Communication with Friends and Family: Not on file  . Frequency of Social Gatherings with Friends and Family: Not on file  . Attends Religious Services: Not on file  . Active Member of Clubs or Organizations: Not on file  . Attends Archivist Meetings: Not on file  . Marital Status: Not on file  Intimate Partner Violence:   . Fear of Current or Ex-Partner: Not on file  . Emotionally Abused: Not on file  . Physically Abused: Not on file  . Sexually Abused: Not on file  Family History  Problem Relation Age of Onset  . Diabetes Other   . Hypertension Other      Current Facility-Administered Medications:  .  acetaminophen (TYLENOL) tablet 650 mg, 650 mg, Oral, Q6H PRN, 650 mg at 06/20/20 1130 **OR** acetaminophen (TYLENOL) suppository 650 mg, 650 mg, Rectal, Q6H PRN, Jonelle Sidle, Mohammad L, MD .  budesonide (PULMICORT) nebulizer solution 0.25 mg, 0.25 mg, Nebulization, BID, Kasa, Kurian, MD .  ceFEPIme (MAXIPIME) 2 g in sodium chloride 0.9 % 100 mL IVPB, 2 g, Intravenous, Q8H, Dessa Phi, DO, Stopped at 06/21/20 0708 .  Chlorhexidine Gluconate Cloth 2 % PADS 6 each, 6 each, Topical, Q0600,  Bradly Bienenstock, NP, 6 each at 06/19/20 0600 .  ipratropium-albuterol (DUONEB) 0.5-2.5 (3) MG/3ML nebulizer solution 3 mL, 3 mL, Nebulization, Q4H, Kasa, Kurian, MD, 3 mL at 06/21/20 1224 .  lactated ringers infusion, , Intravenous, Continuous, Brand Males, MD, Stopped at 06/21/20 0855 .  linezolid (ZYVOX) IVPB 600 mg, 600 mg, Intravenous, Q12H, Dessa Phi, DO, Stopped at 06/21/20 0957 .  LORazepam (ATIVAN) injection 1 mg, 1 mg, Intravenous, Q6H PRN, Darel Hong D, NP, 1 mg at 06/20/20 2131 .  melatonin tablet 5 mg, 5 mg, Oral, QHS, Sharion Settler, NP, 5 mg at 06/20/20 2131 .  methylPREDNISolone sodium succinate (SOLU-MEDROL) 40 mg/mL injection 20 mg, 20 mg, Intravenous, BID, Kasa, Kurian, MD, 20 mg at 06/21/20 1117 .  metoprolol tartrate (LOPRESSOR) injection 5 mg, 5 mg, Intravenous, Q6H PRN, Sharion Settler, NP, 5 mg at 06/20/20 1245 .  morphine 2 MG/ML injection 1-2 mg, 1-2 mg, Intravenous, Q4H PRN, Darel Hong D, NP, 2 mg at 06/20/20 1510 .  ondansetron (ZOFRAN) tablet 4 mg, 4 mg, Oral, Q6H PRN **OR** ondansetron (ZOFRAN) injection 4 mg, 4 mg, Intravenous, Q6H PRN, Gala Romney L, MD, 4 mg at 06/21/20 1116 .  simethicone (MYLICON) chewable tablet 80 mg, 80 mg, Oral, QID PRN, Dessa Phi, DO, 80 mg at 06/21/20 1116 .  sodium chloride flush (NS) 0.9 % injection 10-40 mL, 10-40 mL, Intracatheter, Q12H, Dessa Phi, DO, 10 mL at 06/20/20 1007 .  sodium chloride flush (NS) 0.9 % injection 10-40 mL, 10-40 mL, Intracatheter, PRN, Dessa Phi, DO .  traMADol Veatrice Bourbon) tablet 50 mg, 50 mg, Oral, Q6H PRN, Dessa Phi, DO, 50 mg at 06/19/20 1401 .  traZODone (DESYREL) tablet 50 mg, 50 mg, Oral, QHS PRN, Darel Hong D, NP, 50 mg at 06/20/20 2350   Physical exam:  Vitals:   06/21/20 1020 06/21/20 1100 06/21/20 1123 06/21/20 1200  BP:  121/79  121/79  Pulse:  (!) 120 (!) 125 (!) 126  Resp:  (!) 30 (!) 31 (!) 24  Temp:      TempSrc:      SpO2: 92% 95% 93% 93%   Weight:      Height:       Physical Exam Constitutional:      Appearance: She is ill-appearing.     Comments: Mild respiratory distress  Cardiovascular:     Rate and Rhythm: Tachycardia present.     Heart sounds: Normal heart sounds.  Pulmonary:     Breath sounds: No wheezing.  Chest:     Comments: Right side chest tube Abdominal:     General: There is no distension.     Palpations: Abdomen is soft.  Musculoskeletal:        General: No swelling. Normal range of motion.     Cervical back: Normal range of motion.  Skin:  General: Skin is warm.     Coloration: Skin is not jaundiced.  Neurological:     General: No focal deficit present.     Mental Status: She is alert and oriented to person, place, and time.  Psychiatric:        Mood and Affect: Mood normal.    right breast mass involving skin      CMP Latest Ref Rng & Units 06/21/2020  Glucose 70 - 99 mg/dL 100(H)  BUN 6 - 20 mg/dL 9  Creatinine 0.44 - 1.00 mg/dL 0.34(L)  Sodium 135 - 145 mmol/L 138  Potassium 3.5 - 5.1 mmol/L 3.7  Chloride 98 - 111 mmol/L 100  CO2 22 - 32 mmol/L 30  Calcium 8.9 - 10.3 mg/dL 8.4(L)  Total Protein 6.5 - 8.1 g/dL -  Total Bilirubin 0.3 - 1.2 mg/dL -  Alkaline Phos 38 - 126 U/L -  AST 15 - 41 U/L -  ALT 0 - 44 U/L -   CBC Latest Ref Rng & Units 06/21/2020  WBC 4.0 - 10.5 K/uL 9.4  Hemoglobin 12.0 - 15.0 g/dL 9.4(L)  Hematocrit 36 - 46 % 30.7(L)  Platelets 150 - 400 K/uL 317    RADIOGRAPHIC STUDIES: I have personally reviewed the radiological images as listed and agreed with the findings in the report. DG Chest 1 View  Result Date: 06/16/2020 CLINICAL DATA:  Hemothorax on the right. EXAM: CHEST  1 VIEW COMPARISON:  Same day chest radiograph. FINDINGS: A right-sided pleural pigtail catheter is redemonstrated. There is interval improvement in aeration of the right lung with decreased size of a right hemothorax. A small right pneumothorax is not significantly changed in size.  Severe right atelectasis/airspace disease is redemonstrated. Mild interstitial opacities in the left lung appear unchanged. There is no left pleural effusion or left pneumothorax. The cardiac silhouette is partially obscured but appears unchanged. IMPRESSION: 1. Interval improvement in aeration of the right lung with decreased size of a right hemothorax. 2. Unchanged small right pneumothorax with pleural pigtail catheter in place. 3. Severe right atelectasis/airspace disease. Electronically Signed   By: Zerita Boers M.D.   On: 06/16/2020 14:52   DG Chest 1 View  Result Date: 06/16/2020 CLINICAL DATA:  Cough and phlegm production. EXAM: CHEST  1 VIEW COMPARISON:  Chest radiograph dated 06/15/2020. FINDINGS: The cardiac silhouette is obscured. A right-sided pleural pigtail catheter is in unchanged location and there has been interval decrease in a large right pleural fluid collection (effusion versus hemothorax). There is no longer mediastinal shift to the left. There is a small right pneumothorax. Severe airspace and interstitial opacities are seen on the right. Mild interstitial opacities are seen on the left. There is no left pleural effusion or pneumothorax. The osseous structures are intact. IMPRESSION: 1. Small right pneumothorax with a right-sided pleural pigtail catheter in place. 2. Interval decrease in a large right pleural fluid collection (effusion versus hemothorax). 3. Severe airspace and interstitial opacities on the right. Mild interstitial opacities in the left. Electronically Signed   By: Zerita Boers M.D.   On: 06/16/2020 14:28   DG Chest 1 View  Result Date: 06/15/2020 CLINICAL DATA:  Short of breath, cough EXAM: CHEST  1 VIEW COMPARISON:  06/29/2015 FINDINGS: 2 frontal views of the chest demonstrate complete opacification of the right hemithorax, with slight mediastinal shift to the left, consistent with large right pleural effusion and underlying lung consolidation. Chronic areas of  scarring are seen within the left chest. No pneumothorax. Cardiac silhouette  is unremarkable. No acute bony abnormalities. IMPRESSION: 1. Opacification of the right hemithorax consistent with large right pleural effusion and underlying right lung consolidation. Electronically Signed   By: Randa Ngo M.D.   On: 06/15/2020 21:42   CT Angio Chest PE W and/or Wo Contrast  Result Date: 06/15/2020 CLINICAL DATA:  Cough, shortness of breath EXAM: CT ANGIOGRAPHY CHEST WITH CONTRAST TECHNIQUE: Multidetector CT imaging of the chest was performed using the standard protocol during bolus administration of intravenous contrast. Multiplanar CT image reconstructions and MIPs were obtained to evaluate the vascular anatomy. CONTRAST:  100 mL Omnipaque 350 IV COMPARISON:  06/30/2015 FINDINGS: Cardiovascular: No filling defects in the pulmonary arteries to suggest pulmonary emboli. Heart is normal size. Aorta normal caliber. Mediastinum/Nodes: No visible mediastinal or hilar adenopathy. Bilateral axillary adenopathy, right greater than left. Index right axillary lymph node has a short axis diameter of 13 mm. Left axillary lymph node has a short axis diameter of 11 mm. Lungs/Pleura: Large right pleural effusion completely compressed seen in the right lung which is not aerated. This appears to have mass effect with shift of the right lung and mediastinal structures to the left. Areas of atelectasis or scarring in the left lung base. No effusion on the left. Upper Abdomen: Imaging into the upper abdomen demonstrates no acute findings. Musculoskeletal: Large mass noted in the right breast measuring 8 x 6 cm. Possible separate right breast mass more inferiorly in the right breast measuring 8 x 4 cm. Skin thickening or edema noted. Associated right axillary adenopathy. A few ill-defined sclerotic areas within the thoracic spine could reflect metastases. Review of the MIP images confirms the above findings. IMPRESSION: No evidence of  pulmonary embolus. One, possibly 2 separate large masses within the right breast extending to the skin surface with associated adjacent skin thickening and right axillary adenopathy. Findings compatible with right breast cancer. Large right pleural effusion which appears to have mass effect with complete collapse/compression of the right lung and shift of mediastinal structures to the left. Single enlarged left axillary lymph node. These results were called by telephone at the time of interpretation on 06/15/2020 at 10:43 pm to provider [facility] , who verbally acknowledged these results. Electronically Signed   By: Rolm Baptise M.D.   On: 06/15/2020 22:46   US Venous Img Lower Unilateral Left (DVT)  Result Date: 06/16/2020 CLINICAL DATA:  Initial evaluation for acute left lower extremity edema. EXAM: LEFT LOWER EXTREMITY VENOUS DOPPLER ULTRASOUND TECHNIQUE: Gray-scale sonography with graded compression, as well as color Doppler and duplex ultrasound were performed to evaluate the lower extremity deep venous systems from the level of the common femoral vein and including the common femoral, femoral, profunda femoral, popliteal and calf veins including the posterior tibial, peroneal and gastrocnemius veins when visible. The superficial great saphenous vein was also interrogated. Spectral Doppler was utilized to evaluate flow at rest and with distal augmentation maneuvers in the common femoral, femoral and popliteal veins. COMPARISON:  None. FINDINGS: Contralateral Common Femoral Vein: Respiratory phasicity is normal and symmetric with the symptomatic side. No evidence of thrombus. Normal compressibility. Common Femoral Vein: No evidence of thrombus. Normal compressibility, respiratory phasicity and response to augmentation. Saphenofemoral Junction: No evidence of thrombus. Normal compressibility and flow on color Doppler imaging. Profunda Femoral Vein: No evidence of thrombus. Normal compressibility and flow  on color Doppler imaging. Femoral Vein: No evidence of thrombus. Normal compressibility, respiratory phasicity and response to augmentation. Popliteal Vein: No evidence of thrombus. Normal compressibility, respiratory  phasicity and response to augmentation. Calf Veins: No evidence of thrombus. Normal compressibility and flow on color Doppler imaging. Superficial Great Saphenous Vein: No evidence of thrombus. Normal compressibility. Venous Reflux:  None. Other Findings:  None. IMPRESSION: No evidence of deep venous thrombosis. Electronically Signed   By: Jeannine Boga M.D.   On: 06/16/2020 01:30   DG Chest Port 1 View  Result Date: 06/19/2020 CLINICAL DATA:  Chest tube in place EXAM: PORTABLE CHEST 1 VIEW COMPARISON:  Chest radiograph from one day prior. FINDINGS: Right basilar pigtail chest tube is stable. Stable cardiomediastinal silhouette with mild cardiomegaly. No pneumothorax. Near complete opacification of the right hemithorax with minimally improved central right lung aeration. Extensive fluffy left parahilar lung opacities, slightly worsened. Left retrocardiac consolidation is stable. IMPRESSION: 1. Stable right basilar chest tube without pneumothorax. 2. Near complete opacification of the right hemithorax with minimally improved central right lung aeration, which could be due to any combination of pleural effusion, atelectasis, pneumonia, edema or mass. 3. Stable left retrocardiac consolidation, favor atelectasis. 4. Fluffy left parahilar lung opacities, slightly worsened, favor pulmonary edema. Electronically Signed   By: Ilona Sorrel M.D.   On: 06/19/2020 15:04   DG CHEST PORT 1 VIEW  Result Date: 06/18/2020 CLINICAL DATA:  Malignant pleural effusion, RIGHT-side chest tube EXAM: PORTABLE CHEST 1 VIEW COMPARISON:  Portable exam 1042 hours compared to 11 x 21 FINDINGS: Pigtail RIGHT thoracostomy tube again seen. Persistent subtotal opacification of the RIGHT hemithorax by effusion and  atelectasis, underlying tumor not excluded. Atelectasis versus consolidation of LEFT lower lobe with probable small pleural effusion. Stable heart size. No acute osseous findings. IMPRESSION: Persistent subtotal opacification of the RIGHT hemithorax by effusion and atelectasis, underlying tumor not excluded. Persistent infiltrate and probable pleural effusion at inferior LEFT chest. Electronically Signed   By: Lavonia Dana M.D.   On: 06/18/2020 16:09   DG Chest Port 1 View  Result Date: 06/17/2020 CLINICAL DATA:  Chest x-ray 06/17/2020 5 a.m. EXAM: PORTABLE CHEST 1 VIEW COMPARISON:  Chest x-ray 06/17/2020 4:10 a.m. FINDINGS: Interval increase in size of a likely large right pleural effusion with slight aeration of the right lung. Likely trace pleural effusion on the left. Interval increase in airspace interstitial opacities of the left lung that is most prominent in the left mid lung zone. Right pigtail chest tube overlies the right hemithorax. IMPRESSION: 1. Slightly improved almost complete whiteout of the right chest with slightly decreased in size large pleural effusion. 2. Interval increase in interstitial and alveolar left lung opacities with possible trace left pleural effusion. Electronically Signed   By: Iven Finn M.D.   On: 06/17/2020 21:30   DG Chest Port 1 View  Result Date: 06/17/2020 CLINICAL DATA:  Pleural effusion EXAM: PORTABLE CHEST 1 VIEW COMPARISON:  Yesterday FINDINGS: More dense and homogeneous right sided opacity. Right pleural catheter in stable position. Left lower lobe opacity which also appears increased. Likely no cardiomegaly when allowing for mediastinal shift. IMPRESSION: White out of the right chest with mediastinal mass effect from large pleural effusion. Retrocardiac infiltrate that could be infection or atelectasis. Electronically Signed   By: Monte Fantasia M.D.   On: 06/17/2020 05:00   DG Chest Portable 1 View  Result Date: 06/15/2020 CLINICAL DATA:  Right chest  tube placement. EXAM: PORTABLE CHEST 1 VIEW COMPARISON:  Radiograph and CT earlier today. FINDINGS: Placement of right pigtail catheter. There is persistent complete opacification of the right hemithorax. There may be slight diminished mediastinal shift to  the left. No visualized pneumothorax. No focal abnormality in the left lung. IMPRESSION: Placement of right pigtail catheter with persistent complete opacification of the right hemithorax. There may be slight diminished mediastinal shift to the left. No visualized pneumothorax. Electronically Signed   By: Keith Rake M.D.   On: 06/15/2020 23:40    Assessment and plan-    #Acute hypoxic respiratory failure, secondary to right pleural effusion and complete collapse of right lung. Status post thoracentesis and chest tube insertion.  There is plan for VATS procedure on 06/23/2020 with Dr. Faith Rogue. 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  I discussed with pathology and ER/PR status and HER-2 status are pending currently, and hopefully confirm ER/PR status in 1-2 days.  Palliative care is following. #Stage IV metastatic breast cancer, further management pending pending receptor status #Sepsis due to right breast cellulitis and possible HCAP.  Continue IV antibiotics.  DVT prophylaxis with Lovenox. Thank you for allowing me to participate in the care of this patient.   Earlie Server, MD, PhD Hematology Oncology [facility] Pager- [phone] 06/21/2020

## 2020-06-21 NOTE — Progress Notes (Addendum)
[place] visited pt. today to help get AD notarized; pt. names  Jessica Willis as primary MPOA and Jessica Willis as alt. MPOA.  Contact info updated in EPIC;  pt. expresses wish to have life-prolonging measures continued in situations described in Living Will and leaves discretion to MPOA in decisionmaking.  AD notarized in presence of witnesses; copy placed in paper chart and scanned to [facility]; extra copies given to pt.

## 2020-06-21 NOTE — Progress Notes (Signed)
PRN IV lopressor given for HR sustained over 125. HR now 112. Pt denies any pain at this time.

## 2020-06-21 NOTE — ACP (Advance Care Planning) (Signed)
Merlene Laughter as primary MPOA and Dudley Major as alt. MPOA.  Contact info updated in EPIC;  pt. expresses wish to have life-prolonging measures continued in situations described in Living Will and leaves discretion to MPOA in decisionmaking.

## 2020-06-21 NOTE — Progress Notes (Signed)
   06/21/20 1130  Clinical Encounter Type  Visited With Patient  Visit Type Follow-up  Referral From Chaplain  Consult/Referral To Chaplain  Chaplain stopped by to make sure AD form had been completed. She read through and told Pt that a chaplain will be by after a notary and two witnesses are found. She said ok.

## 2020-06-22 ENCOUNTER — Inpatient Hospital Stay: Payer: Medicaid Other

## 2020-06-22 DIAGNOSIS — R000 Tachycardia, unspecified: Secondary | ICD-10-CM

## 2020-06-22 DIAGNOSIS — D638 Anemia in other chronic diseases classified elsewhere: Secondary | ICD-10-CM

## 2020-06-22 LAB — CBC
HCT: 31.1 % — ABNORMAL LOW (ref 36.0–46.0)
Hemoglobin: 9.6 g/dL — ABNORMAL LOW (ref 12.0–15.0)
MCH: 25.6 pg — ABNORMAL LOW (ref 26.0–34.0)
MCHC: 30.9 g/dL (ref 30.0–36.0)
MCV: 82.9 fL (ref 80.0–100.0)
Platelets: 378 10*3/uL (ref 150–400)
RBC: 3.75 MIL/uL — ABNORMAL LOW (ref 3.87–5.11)
RDW: 12.7 % (ref 11.5–15.5)
WBC: 10 10*3/uL (ref 4.0–10.5)
nRBC: 0 % (ref 0.0–0.2)

## 2020-06-22 LAB — BASIC METABOLIC PANEL
Anion gap: 12 (ref 5–15)
BUN: 11 mg/dL (ref 6–20)
CO2: 29 mmol/L (ref 22–32)
Calcium: 8.7 mg/dL — ABNORMAL LOW (ref 8.9–10.3)
Chloride: 97 mmol/L — ABNORMAL LOW (ref 98–111)
Creatinine, Ser: 0.46 mg/dL (ref 0.44–1.00)
GFR, Estimated: >60 mL/min (ref >60)
Glucose, Bld: 170 mg/dL — ABNORMAL HIGH (ref 70–99)
Potassium: 3.9 mmol/L (ref 3.5–5.1)
Sodium: 138 mmol/L (ref 135–145)

## 2020-06-22 LAB — CHOLESTEROL, BODY FLUID: Cholesterol, Fluid: 77 mg/dL

## 2020-06-22 LAB — PHOSPHORUS: Phosphorus: 3.1 mg/dL (ref 2.5–4.6)

## 2020-06-22 LAB — MAGNESIUM: Magnesium: 2.3 mg/dL (ref 1.7–2.4)

## 2020-06-22 IMAGING — DX DG CHEST 1V PORT
1 series · 1 of 1 positions shown · non-contrast
Comparison: [DATE]

CLINICAL DATA: Preoperative chest evaluation

EXAM:
PORTABLE CHEST 1 VIEW

[chest ap]
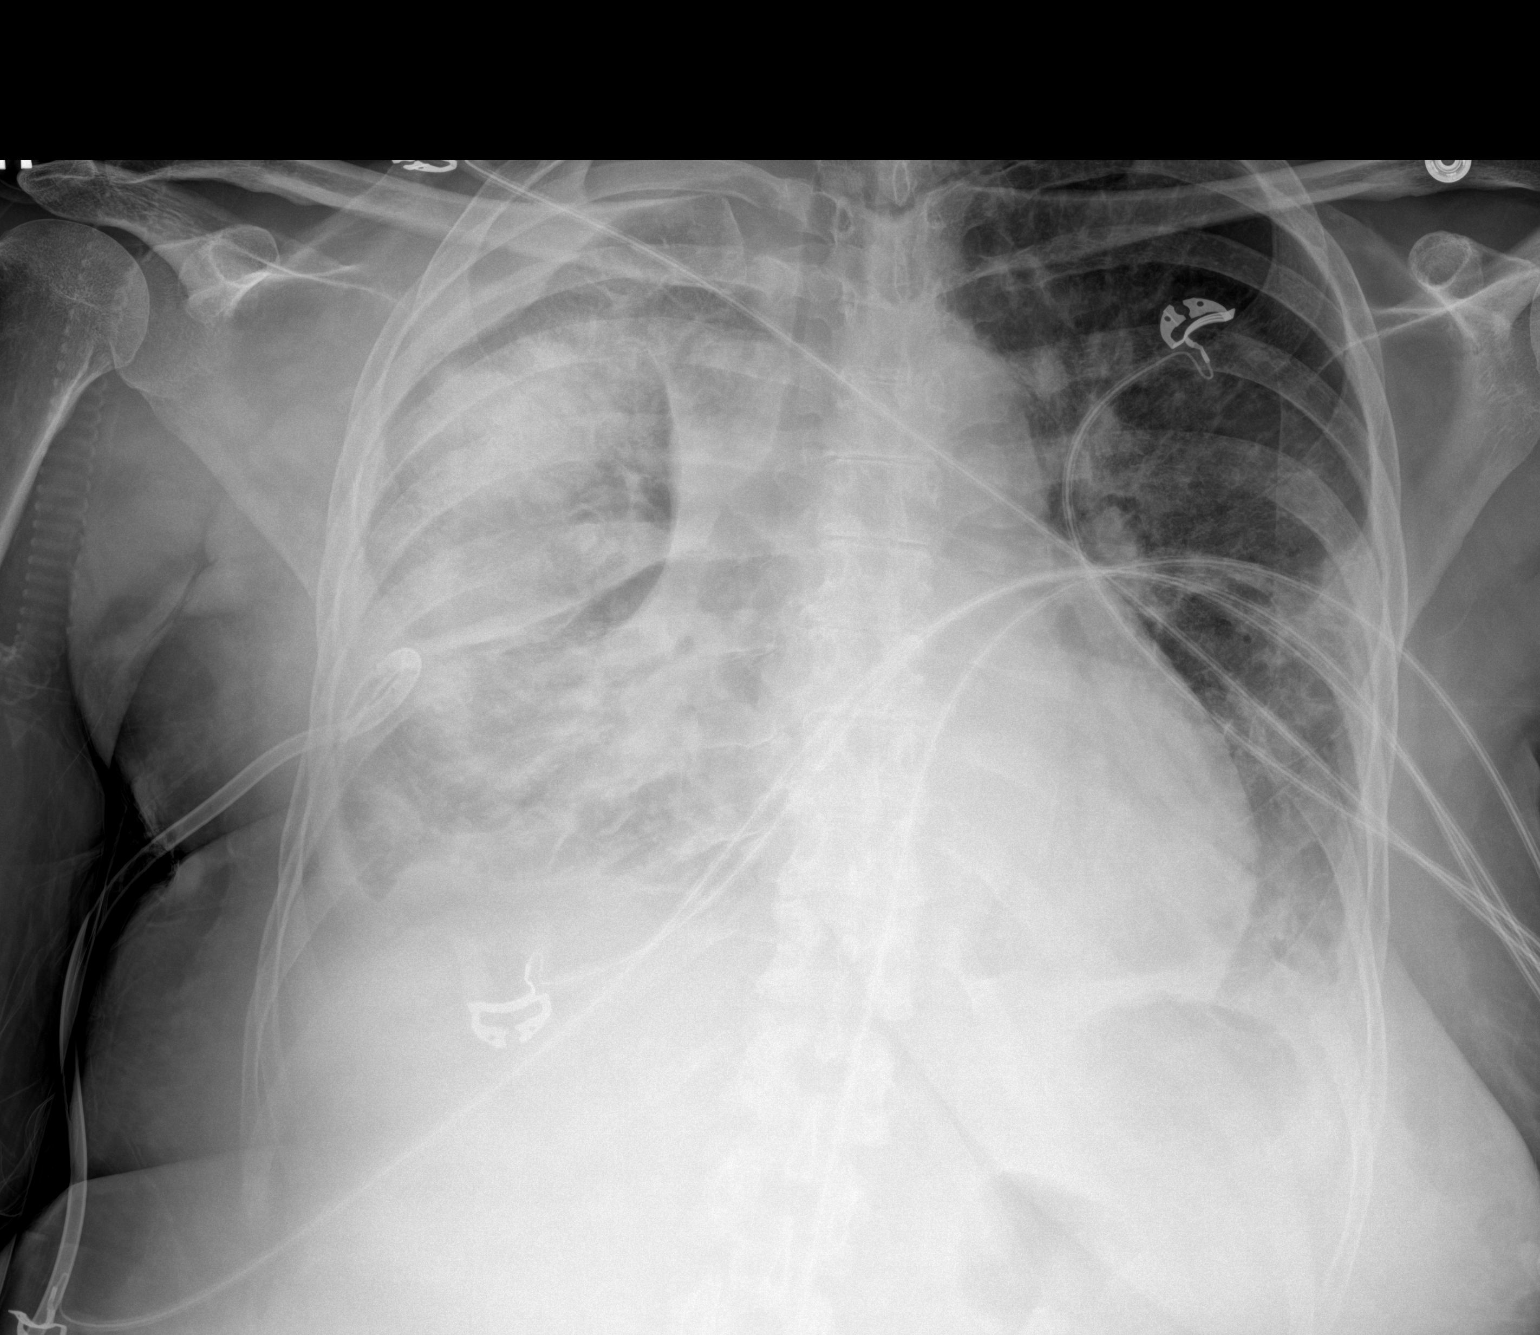

[1 of 1 positions shown; findings below may reference images not displayed]

FINDINGS: Chest tube remains in place on the right. Somewhat less pleural
density on the right. There may be a small amount of air in the
pleural space. Persistent diffuse infiltrate in the right lung.
Patchy infiltrate in the mid and lower lung on the left is somewhat
improved.
IMPRESSION: 1. Chest tube remains in place on the right. Somewhat less pleural
fluid. Question small amount of air in the pleural space.
2. Persistent diffuse infiltrate on the right.
3. Improved aeration in the left mid and lower lung.

## 2020-06-22 MED ORDER — POLYETHYLENE GLYCOL 3350 17 G PO PACK
17.0000 g | PACK | Freq: Every day | ORAL | Status: DC | PRN
  Administered 2020-06-22: 17 g via ORAL
  Filled 2020-06-22: qty 1

## 2020-06-22 MED ORDER — DOCUSATE SODIUM 100 MG PO CAPS
100.0000 mg | ORAL_CAPSULE | Freq: Two times a day (BID) | ORAL | Status: DC
  Administered 2020-06-22 – 2020-07-01 (×16): 100 mg via ORAL
  Filled 2020-06-22 (×16): qty 1

## 2020-06-22 NOTE — Progress Notes (Addendum)
Hematology/Oncology Progress Note [facility] Telephone:([phone] [phone]  Patient Care Team: System, Provider Not In as PCP - General   Name of the patient: Jessica Willis  [id number]  [birth year]  Date of visit: 06/22/20   INTERVAL HISTORY-  + chest tube.  Remains in ICU due to acute respiratory failure. Remains on high flow oxygen.  she reports that she had a good night sleep yesterday.  Review of systems- Review of Systems  Unable to perform ROS: Severe respiratory distress  Constitutional: Positive for appetite change, fatigue and unexpected weight change.  Respiratory: Positive for cough and shortness of breath.   Cardiovascular: Negative for leg swelling.  Gastrointestinal: Negative for abdominal pain.  Skin: Negative for rash.  Neurological: Negative for light-headedness.  Psychiatric/Behavioral: Negative for confusion.    No Known Allergies  Patient Active Problem List   Diagnosis Date Noted  . Metastatic breast cancer ([place])   . Malignant pleural effusion   . Chest tube in place   . Goals of care, counseling/discussion   . Hemothorax on left 06/16/2020  . Acute respiratory failure with hypoxia ([place])   . Breast mass, right 06/15/2020  . Mastitis 06/15/2020  . Pleural effusion on right 06/15/2020  . Intestinal obstruction ([place])   . Leukocytosis   . Hypokalemia 06/24/2015  . Sepsis ([place]) 06/23/2015  . CAP (community acquired pneumonia) 06/23/2015  . Partial small bowel obstruction ([place]) 06/23/2015     Past Medical History:  Diagnosis Date  . Anemia   . Patient denies medical problems      Past Surgical History:  Procedure Laterality Date  . TUBAL LIGATION      Social History   Socioeconomic History  . Marital status: Single    Spouse name: Not on file  . Number of children: Not on file  . Years of education: Not on file  . Highest education level: Not on file  Occupational History  . Not on file   Tobacco Use  . Smoking status: Never Smoker  . Smokeless tobacco: Never Used  Substance and Sexual Activity  . Alcohol use: Not Currently    Alcohol/week: 1.0 standard drink    Types: 1 Cans of beer per week  . Drug use: No  . Sexual activity: Not on file  Other Topics Concern  . Not on file  Social History Narrative  . Not on file   Social Determinants of Health   Financial Resource Strain:   . Difficulty of Paying Living Expenses: Not on file  Food Insecurity:   . Worried About Charity fundraiser in the Last Year: Not on file  . Ran Out of Food in the Last Year: Not on file  Transportation Needs:   . Lack of Transportation (Medical): Not on file  . Lack of Transportation (Non-Medical): Not on file  Physical Activity:   . Days of Exercise per Week: Not on file  . Minutes of Exercise per Session: Not on file  Stress:   . Feeling of Stress : Not on file  Social Connections:   . Frequency of Communication with Friends and Family: Not on file  . Frequency of Social Gatherings with Friends and Family: Not on file  . Attends Religious Services: Not on file  . Active Member of Clubs or Organizations: Not on file  . Attends Archivist Meetings: Not on file  . Marital Status: Not on file  Intimate Partner Violence:   . Fear of Current or  Ex-Partner: Not on file  . Emotionally Abused: Not on file  . Physically Abused: Not on file  . Sexually Abused: Not on file     Family History  Problem Relation Age of Onset  . Diabetes Other   . Hypertension Other      Current Facility-Administered Medications:  .  acetaminophen (TYLENOL) tablet 650 mg, 650 mg, Oral, Q6H PRN, 650 mg at 06/21/20 1817 **OR** acetaminophen (TYLENOL) suppository 650 mg, 650 mg, Rectal, Q6H PRN, Jonelle Sidle, Mohammad L, MD .  budesonide (PULMICORT) nebulizer solution 0.25 mg, 0.25 mg, Nebulization, BID, Kasa, Kurian, MD, 0.25 mg at 06/22/20 0759 .  Chlorhexidine Gluconate Cloth 2 % PADS 6 each, 6  each, Topical, Q0600, Bradly Bienenstock, NP, 6 each at 06/19/20 0600 .  docusate sodium (COLACE) capsule 100 mg, 100 mg, Oral, BID, Rust-Chester, Britton L, NP, 100 mg at 06/22/20 1114 .  ipratropium-albuterol (DUONEB) 0.5-2.5 (3) MG/3ML nebulizer solution 3 mL, 3 mL, Nebulization, Q4H, Kasa, Kurian, MD, 3 mL at 06/22/20 1226 .  lactated ringers infusion, , Intravenous, Continuous, Brand Males, MD, Stopped at 06/21/20 0855 .  linezolid (ZYVOX) IVPB 600 mg, 600 mg, Intravenous, Q12H, Dessa Phi, DO, Last Rate: 300 mL/hr at 06/22/20 1117, 600 mg at 06/22/20 1117 .  LORazepam (ATIVAN) injection 1 mg, 1 mg, Intravenous, Q6H PRN, Darel Hong D, NP, 1 mg at 06/20/20 2131 .  melatonin tablet 5 mg, 5 mg, Oral, QHS, Sharion Settler, NP, 5 mg at 06/21/20 2143 .  methylPREDNISolone sodium succinate (SOLU-MEDROL) 40 mg/mL injection 20 mg, 20 mg, Intravenous, BID, Kasa, Kurian, MD, 20 mg at 06/22/20 1114 .  metoprolol tartrate (LOPRESSOR) injection 5 mg, 5 mg, Intravenous, Q6H PRN, Sharion Settler, NP, 5 mg at 06/21/20 2049 .  morphine 2 MG/ML injection 1-2 mg, 1-2 mg, Intravenous, Q4H PRN, Darel Hong D, NP, 2 mg at 06/20/20 1510 .  ondansetron (ZOFRAN) tablet 4 mg, 4 mg, Oral, Q6H PRN **OR** ondansetron (ZOFRAN) injection 4 mg, 4 mg, Intravenous, Q6H PRN, Gala Romney L, MD, 4 mg at 06/21/20 1116 .  polyethylene glycol (MIRALAX / GLYCOLAX) packet 17 g, 17 g, Oral, Daily PRN, Rust-Chester, Britton L, NP, 17 g at 06/22/20 1125 .  simethicone (MYLICON) chewable tablet 80 mg, 80 mg, Oral, QID PRN, Dessa Phi, DO, 80 mg at 06/22/20 0538 .  sodium chloride flush (NS) 0.9 % injection 10-40 mL, 10-40 mL, Intracatheter, Q12H, Dessa Phi, DO, 10 mL at 06/20/20 1007 .  sodium chloride flush (NS) 0.9 % injection 10-40 mL, 10-40 mL, Intracatheter, PRN, Dessa Phi, DO .  traMADol Veatrice Bourbon) tablet 50 mg, 50 mg, Oral, Q6H PRN, Dessa Phi, DO, 50 mg at 06/19/20 1401 .  traZODone (DESYREL)  tablet 50 mg, 50 mg, Oral, QHS PRN, Darel Hong D, NP, 50 mg at 06/20/20 2350   Physical exam:  Vitals:   06/22/20 1100 06/22/20 1130 06/22/20 1200 06/22/20 1226  BP: 116/77  123/72   Pulse: (!) 120 (!) 121 (!) 121   Resp: (!) 32 (!) 27 (!) 27   Temp:  (!) 97.5 F (36.4 C)    TempSrc:  Axillary    SpO2: 98% 100% 96% 97%  Weight:      Height:       Physical Exam Constitutional:      Appearance: She is ill-appearing.     Comments: Mild respiratory distress  Cardiovascular:     Rate and Rhythm: Tachycardia present.     Heart sounds: Normal heart sounds.  Pulmonary:  Breath sounds: No wheezing.  Chest:     Comments: Right side chest tube Abdominal:     General: There is no distension.     Palpations: Abdomen is soft.  Musculoskeletal:        General: No swelling. Normal range of motion.     Cervical back: Normal range of motion.  Skin:    General: Skin is warm.     Coloration: Skin is not jaundiced.  Neurological:     General: No focal deficit present.     Mental Status: She is alert and oriented to person, place, and time.  Psychiatric:        Mood and Affect: Mood normal.    right breast mass involving skin      CMP Latest Ref Rng & Units 06/22/2020  Glucose 70 - 99 mg/dL 170(H)  BUN 6 - 20 mg/dL 11  Creatinine 0.44 - 1.00 mg/dL 0.46  Sodium 135 - 145 mmol/L 138  Potassium 3.5 - 5.1 mmol/L 3.9  Chloride 98 - 111 mmol/L 97(L)  CO2 22 - 32 mmol/L 29  Calcium 8.9 - 10.3 mg/dL 8.7(L)  Total Protein 6.5 - 8.1 g/dL -  Total Bilirubin 0.3 - 1.2 mg/dL -  Alkaline Phos 38 - 126 U/L -  AST 15 - 41 U/L -  ALT 0 - 44 U/L -   CBC Latest Ref Rng & Units 06/22/2020  WBC 4.0 - 10.5 K/uL 10.0  Hemoglobin 12.0 - 15.0 g/dL 9.6(L)  Hematocrit 36 - 46 % 31.1(L)  Platelets 150 - 400 K/uL 378    RADIOGRAPHIC STUDIES: I have personally reviewed the radiological images as listed and agreed with the findings in the report. DG Chest 1 View  Result Date:  06/16/2020 CLINICAL DATA:  Hemothorax on the right. EXAM: CHEST  1 VIEW COMPARISON:  Same day chest radiograph. FINDINGS: A right-sided pleural pigtail catheter is redemonstrated. There is interval improvement in aeration of the right lung with decreased size of a right hemothorax. A small right pneumothorax is not significantly changed in size. Severe right atelectasis/airspace disease is redemonstrated. Mild interstitial opacities in the left lung appear unchanged. There is no left pleural effusion or left pneumothorax. The cardiac silhouette is partially obscured but appears unchanged. IMPRESSION: 1. Interval improvement in aeration of the right lung with decreased size of a right hemothorax. 2. Unchanged small right pneumothorax with pleural pigtail catheter in place. 3. Severe right atelectasis/airspace disease. Electronically Signed   By: Zerita Boers M.D.   On: 06/16/2020 14:52   DG Chest 1 View  Result Date: 06/16/2020 CLINICAL DATA:  Cough and phlegm production. EXAM: CHEST  1 VIEW COMPARISON:  Chest radiograph dated 06/15/2020. FINDINGS: The cardiac silhouette is obscured. A right-sided pleural pigtail catheter is in unchanged location and there has been interval decrease in a large right pleural fluid collection (effusion versus hemothorax). There is no longer mediastinal shift to the left. There is a small right pneumothorax. Severe airspace and interstitial opacities are seen on the right. Mild interstitial opacities are seen on the left. There is no left pleural effusion or pneumothorax. The osseous structures are intact. IMPRESSION: 1. Small right pneumothorax with a right-sided pleural pigtail catheter in place. 2. Interval decrease in a large right pleural fluid collection (effusion versus hemothorax). 3. Severe airspace and interstitial opacities on the right. Mild interstitial opacities in the left. Electronically Signed   By: Zerita Boers M.D.   On: 06/16/2020 14:28   DG Chest 1  View  Result Date: 06/15/2020 CLINICAL DATA:  Short of breath, cough EXAM: CHEST  1 VIEW COMPARISON:  06/29/2015 FINDINGS: 2 frontal views of the chest demonstrate complete opacification of the right hemithorax, with slight mediastinal shift to the left, consistent with large right pleural effusion and underlying lung consolidation. Chronic areas of scarring are seen within the left chest. No pneumothorax. Cardiac silhouette is unremarkable. No acute bony abnormalities. IMPRESSION: 1. Opacification of the right hemithorax consistent with large right pleural effusion and underlying right lung consolidation. Electronically Signed   By: Randa Ngo M.D.   On: 06/15/2020 21:42   CT Angio Chest PE W and/or Wo Contrast  Result Date: 06/15/2020 CLINICAL DATA:  Cough, shortness of breath EXAM: CT ANGIOGRAPHY CHEST WITH CONTRAST TECHNIQUE: Multidetector CT imaging of the chest was performed using the standard protocol during bolus administration of intravenous contrast. Multiplanar CT image reconstructions and MIPs were obtained to evaluate the vascular anatomy. CONTRAST:  100 mL Omnipaque 350 IV COMPARISON:  06/30/2015 FINDINGS: Cardiovascular: No filling defects in the pulmonary arteries to suggest pulmonary emboli. Heart is normal size. Aorta normal caliber. Mediastinum/Nodes: No visible mediastinal or hilar adenopathy. Bilateral axillary adenopathy, right greater than left. Index right axillary lymph node has a short axis diameter of 13 mm. Left axillary lymph node has a short axis diameter of 11 mm. Lungs/Pleura: Large right pleural effusion completely compressed seen in the right lung which is not aerated. This appears to have mass effect with shift of the right lung and mediastinal structures to the left. Areas of atelectasis or scarring in the left lung base. No effusion on the left. Upper Abdomen: Imaging into the upper abdomen demonstrates no acute findings. Musculoskeletal: Large mass noted in the right  breast measuring 8 x 6 cm. Possible separate right breast mass more inferiorly in the right breast measuring 8 x 4 cm. Skin thickening or edema noted. Associated right axillary adenopathy. A few ill-defined sclerotic areas within the thoracic spine could reflect metastases. Review of the MIP images confirms the above findings. IMPRESSION: No evidence of pulmonary embolus. One, possibly 2 separate large masses within the right breast extending to the skin surface with associated adjacent skin thickening and right axillary adenopathy. Findings compatible with right breast cancer. Large right pleural effusion which appears to have mass effect with complete collapse/compression of the right lung and shift of mediastinal structures to the left. Single enlarged left axillary lymph node. These results were called by telephone at the time of interpretation on 06/15/2020 at 10:43 pm to provider [facility] , who verbally acknowledged these results. Electronically Signed   By: Rolm Baptise M.D.   On: 06/15/2020 22:46   US Venous Img Lower Unilateral Left (DVT)  Result Date: 06/16/2020 CLINICAL DATA:  Initial evaluation for acute left lower extremity edema. EXAM: LEFT LOWER EXTREMITY VENOUS DOPPLER ULTRASOUND TECHNIQUE: Gray-scale sonography with graded compression, as well as color Doppler and duplex ultrasound were performed to evaluate the lower extremity deep venous systems from the level of the common femoral vein and including the common femoral, femoral, profunda femoral, popliteal and calf veins including the posterior tibial, peroneal and gastrocnemius veins when visible. The superficial great saphenous vein was also interrogated. Spectral Doppler was utilized to evaluate flow at rest and with distal augmentation maneuvers in the common femoral, femoral and popliteal veins. COMPARISON:  None. FINDINGS: Contralateral Common Femoral Vein: Respiratory phasicity is normal and symmetric with the symptomatic side. No  evidence of thrombus. Normal compressibility. Common Femoral Vein: No  evidence of thrombus. Normal compressibility, respiratory phasicity and response to augmentation. Saphenofemoral Junction: No evidence of thrombus. Normal compressibility and flow on color Doppler imaging. Profunda Femoral Vein: No evidence of thrombus. Normal compressibility and flow on color Doppler imaging. Femoral Vein: No evidence of thrombus. Normal compressibility, respiratory phasicity and response to augmentation. Popliteal Vein: No evidence of thrombus. Normal compressibility, respiratory phasicity and response to augmentation. Calf Veins: No evidence of thrombus. Normal compressibility and flow on color Doppler imaging. Superficial Great Saphenous Vein: No evidence of thrombus. Normal compressibility. Venous Reflux:  None. Other Findings:  None. IMPRESSION: No evidence of deep venous thrombosis. Electronically Signed   By: Jeannine Boga M.D.   On: 06/16/2020 01:30   DG Chest Port 1 View  Result Date: 06/19/2020 CLINICAL DATA:  Chest tube in place EXAM: PORTABLE CHEST 1 VIEW COMPARISON:  Chest radiograph from one day prior. FINDINGS: Right basilar pigtail chest tube is stable. Stable cardiomediastinal silhouette with mild cardiomegaly. No pneumothorax. Near complete opacification of the right hemithorax with minimally improved central right lung aeration. Extensive fluffy left parahilar lung opacities, slightly worsened. Left retrocardiac consolidation is stable. IMPRESSION: 1. Stable right basilar chest tube without pneumothorax. 2. Near complete opacification of the right hemithorax with minimally improved central right lung aeration, which could be due to any combination of pleural effusion, atelectasis, pneumonia, edema or mass. 3. Stable left retrocardiac consolidation, favor atelectasis. 4. Fluffy left parahilar lung opacities, slightly worsened, favor pulmonary edema. Electronically Signed   By: Ilona Sorrel M.D.   On:  06/19/2020 15:04   DG CHEST PORT 1 VIEW  Result Date: 06/18/2020 CLINICAL DATA:  Malignant pleural effusion, RIGHT-side chest tube EXAM: PORTABLE CHEST 1 VIEW COMPARISON:  Portable exam 1042 hours compared to 11 x 21 FINDINGS: Pigtail RIGHT thoracostomy tube again seen. Persistent subtotal opacification of the RIGHT hemithorax by effusion and atelectasis, underlying tumor not excluded. Atelectasis versus consolidation of LEFT lower lobe with probable small pleural effusion. Stable heart size. No acute osseous findings. IMPRESSION: Persistent subtotal opacification of the RIGHT hemithorax by effusion and atelectasis, underlying tumor not excluded. Persistent infiltrate and probable pleural effusion at inferior LEFT chest. Electronically Signed   By: Lavonia Dana M.D.   On: 06/18/2020 16:09   DG Chest Port 1 View  Result Date: 06/17/2020 CLINICAL DATA:  Chest x-ray 06/17/2020 5 a.m. EXAM: PORTABLE CHEST 1 VIEW COMPARISON:  Chest x-ray 06/17/2020 4:10 a.m. FINDINGS: Interval increase in size of a likely large right pleural effusion with slight aeration of the right lung. Likely trace pleural effusion on the left. Interval increase in airspace interstitial opacities of the left lung that is most prominent in the left mid lung zone. Right pigtail chest tube overlies the right hemithorax. IMPRESSION: 1. Slightly improved almost complete whiteout of the right chest with slightly decreased in size large pleural effusion. 2. Interval increase in interstitial and alveolar left lung opacities with possible trace left pleural effusion. Electronically Signed   By: Iven Finn M.D.   On: 06/17/2020 21:30   DG Chest Port 1 View  Result Date: 06/17/2020 CLINICAL DATA:  Pleural effusion EXAM: PORTABLE CHEST 1 VIEW COMPARISON:  Yesterday FINDINGS: More dense and homogeneous right sided opacity. Right pleural catheter in stable position. Left lower lobe opacity which also appears increased. Likely no cardiomegaly when  allowing for mediastinal shift. IMPRESSION: White out of the right chest with mediastinal mass effect from large pleural effusion. Retrocardiac infiltrate that could be infection or atelectasis. Electronically Signed  By: Monte Fantasia M.D.   On: 06/17/2020 05:00   DG Chest Portable 1 View  Result Date: 06/15/2020 CLINICAL DATA:  Right chest tube placement. EXAM: PORTABLE CHEST 1 VIEW COMPARISON:  Radiograph and CT earlier today. FINDINGS: Placement of right pigtail catheter. There is persistent complete opacification of the right hemithorax. There may be slight diminished mediastinal shift to the left. No visualized pneumothorax. No focal abnormality in the left lung. IMPRESSION: Placement of right pigtail catheter with persistent complete opacification of the right hemithorax. There may be slight diminished mediastinal shift to the left. No visualized pneumothorax. Electronically Signed   By: Keith Rake M.D.   On: 06/15/2020 23:40    Assessment and plan-    #Acute hypoxic respiratory failure, secondary to right pleural effusion and complete collapse of right lung. Status post thoracentesis and chest tube insertion.  Plan for VATS procedure on 06/23/2020 with Dr. Faith Rogue. 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   hopefully confirm ER/PR status soon.  I discussed with patient about possible antiestrogen treatment if ER is positive.  She agrees with the plan.- addendum ER/PR negative. She will need chemotherapy +/- target therapy when she is hemodynamically stable.  HER-2 status takes a few days to result in general. Palliative care is following. #Stage IV metastatic breast cancer, further management pending pending receptor status #Sepsis due to right breast cellulitis and possible HCAP.  Continue IV antibiotics. Continue DVT prophylaxis. Thank you for allowing me to participate in the care of this patient.   Earlie Server, MD,  PhD Hematology Oncology [facility] at [facility] Pager- [phone] 06/22/2020

## 2020-06-22 NOTE — Progress Notes (Signed)
Jessica Willis Follow Up Note  Patient ID: Jessica Willis, female   DOB: 1966-01-17, 54 y.o.   MRN: [id number]  HISTORY: No new complaints today.  She is not short of breath at rest.    Vitals:   06/22/20 0900 06/22/20 1000  BP: 122/72 121/76  Pulse: (!) 126 (!) 124  Resp: (!) 31 (!) 30  Temp:    SpO2: 95% 92%     EXAM:  Resp: Lungs are clear on the left and diminished on the right with bilateral rhonchi.  No respiratory distress, normal effort. Heart:  Regular without murmurs.  Tachycardic with heart rate of 110-120 Abd:  Abdomen is soft, non distended and non tender. No masses are palpable.  There is no rebound and no guarding.  Neurological: Alert and oriented to person, place, and time. Coordination normal.  Skin: Skin is warm and dry. No rash noted. No diaphoretic. No erythema. No pallor.  Psychiatric: Normal mood and affect. Normal behavior. Judgment and thought content normal.      ASSESSMENT: Right-sided malignant pleural effusion   PLAN:   I had the opportunity today to meet with the son along with the patient and reviewed with them the indications and risks of right sided thoracoscopy possible thoracotomy with talc pleurodesis and possible Pleurx catheter insertion.  Risks were reviewed in detail.  They understand and would like [place] to proceed.  We will plan on doing this tomorrow morning.  She should be kept n.p.o. after midnight.    Nestor Lewandowsky, MDPatient ID: Jessica Willis, female   DOB: June 11, 1966, 54 y.o.   MRN: [id number]

## 2020-06-22 NOTE — Progress Notes (Signed)
Patient ID: Jessica Willis, female   DOB: [birth year], 54 y.o.   MRN: [id number] [facility] PROGRESS NOTE  Jessica Willis HYI:[id number] DOB: 1966/02/26 DOA: 06/15/2020 PCP: System, Provider Not In  HPI/Subjective: Patient seen this morning.  Feeling okay.  Appetite poor.  Some shortness of breath.  Some cough.  Admitted with acute hypoxic respiratory failure.  Patient currently has chest tube in and plan is for VATS procedure tomorrow.  Patient on heated high flow nasal cannula.   Brief Hospital course.  11/3 had a right pigtail catheter placed.  Patient has had increasing oxygen requirements.  Patient now on heated high flow nasal cannula 50 L and this morning was on 86% FiO2.  To be scheduled for a VATS procedure for tomorrow.   Objective: Vitals:   06/22/20 1200 06/22/20 1226  BP: 123/72   Pulse: (!) 121   Resp: (!) 27   Temp:    SpO2: 96% 97%    Intake/Output Summary (Last 24 hours) at 06/22/2020 1257 Last data filed at 06/22/2020 1117 Gross per 24 hour  Intake 700.02 ml  Output 160 ml  Net 540.02 ml   Filed Weights   06/15/20 2114  Weight: 86.2 kg    ROS: Review of Systems  Respiratory: Positive for cough and shortness of breath.   Cardiovascular: Negative for chest pain.  Gastrointestinal: Positive for nausea. Negative for abdominal pain and vomiting.   Exam: Physical Exam HENT:     Head: Normocephalic.     Mouth/Throat:     Pharynx: No oropharyngeal exudate.  Eyes:     General: Lids are normal.     Conjunctiva/sclera: Conjunctivae normal.     Pupils: Pupils are equal, round, and reactive to light.  Cardiovascular:     Rate and Rhythm: Regular rhythm. Tachycardia present.     Heart sounds: Normal heart sounds, S1 normal and S2 normal.  Pulmonary:     Breath sounds: Examination of the right-middle field reveals decreased breath sounds. Examination of the right-lower field reveals decreased breath sounds. Decreased breath sounds present. No  wheezing, rhonchi or rales.  Abdominal:     Palpations: Abdomen is soft.     Tenderness: There is no abdominal tenderness.  Musculoskeletal:     Right lower leg: No swelling.     Left lower leg: No swelling.  Skin:    General: Skin is warm.     Findings: No rash.  Neurological:     Mental Status: She is alert and oriented to person, place, and time.       Data Reviewed: Basic Metabolic Panel: Recent Labs  Lab 06/15/20 2125 06/16/20 0034 06/17/20 0453 06/17/20 0453 06/18/20 0529 06/19/20 0450 06/20/20 0506 06/21/20 0636 06/22/20 0627  NA 143   < > 135   < > 134* 136 138 138 138  K 3.3*   < > 4.4   < > 4.1 4.1 4.3 3.7 3.9  CL 101   < > 102   < > 99 101 101 100 97*  CO2 25   < > 19*   < > 28 30 28 30 29   GLUCOSE 146*   < > 119*   < > 106* 102* 95 100* 170*  BUN 22*   < > 17   < > 14 11 12 9 11   CREATININE 0.61   < > 0.75   < > 0.66 0.48 0.52 0.34* 0.46  CALCIUM 9.5   < > 9.0   < > 8.7* 8.5* 8.5* 8.4*  8.7*  MG 2.2  --  2.0  --  1.9  --   --   --  2.3  PHOS  --   --   --   --  3.2  --   --   --  3.1   < > = values in this interval not displayed.   Liver Function Tests: Recent Labs  Lab 06/15/20 2125 06/16/20 0034  AST 43* 38  ALT 29 27  ALKPHOS 107 102  BILITOT 1.0 0.8  PROT 9.0* 8.8*  ALBUMIN 4.0 3.9  3.9   CBC: Recent Labs  Lab 06/15/20 2125 06/16/20 0034 06/18/20 0529 06/19/20 0450 06/20/20 0506 06/21/20 0636 06/22/20 0627  WBC 15.1*   < > 16.5* 15.9* 11.7* 9.4 10.0  NEUTROABS 12.3*  --   --   --   --   --   --   HGB 11.5*   < > 10.1* 11.2* 9.7* 9.4* 9.6*  HCT 36.3   < > 31.6* 35.9* 31.1* 30.7* 31.1*  MCV 82.1   < > 81.2 83.1 83.4 82.1 82.9  PLT 568*   < > 368 358 325 317 378   < > = values in this interval not displayed.   BNP (last 3 results) Recent Labs    06/15/20 2125  BNP 49.6      Recent Results (from the past 240 hour(s))  Respiratory Panel by RT PCR (Flu A&B, Covid) - Nasopharyngeal Swab     Status: None   Collection Time:  06/15/20  9:25 PM   Specimen: Nasopharyngeal Swab  Result Value Ref Range Status   SARS Coronavirus 2 by RT PCR NEGATIVE NEGATIVE Final    Comment: (NOTE) SARS-CoV-2 target nucleic acids are NOT DETECTED.  The SARS-CoV-2 RNA is generally detectable in upper respiratoy specimens during the acute phase of infection. The lowest concentration of SARS-CoV-2 viral copies this assay can detect is 131 copies/mL. A negative result does not preclude SARS-Cov-2 infection and should not be used as the sole basis for treatment or other patient management decisions. A negative result may occur with  improper specimen collection/handling, submission of specimen other than nasopharyngeal swab, presence of viral mutation(s) within the areas targeted by this assay, and inadequate number of viral copies (<131 copies/mL). A negative result must be combined with clinical observations, patient history, and epidemiological information. The expected result is Negative.  Fact Sheet for Patients:  PinkCheek.be  Fact Sheet for Healthcare Providers:  GravelBags.it  This test is no t yet approved or cleared by the [place] FDA and  has been authorized for detection and/or diagnosis of SARS-CoV-2 by FDA under an Emergency Use Authorization (EUA). This EUA will remain  in effect (meaning this test can be used) for the duration of the COVID-19 declaration under Section 564(b)(1) of the Act, 21 U.S.C. section 360bbb-3(b)(1), unless the authorization is terminated or revoked sooner.     Influenza A by PCR NEGATIVE NEGATIVE Final   Influenza B by PCR NEGATIVE NEGATIVE Final    Comment: (NOTE) The Xpert Xpress SARS-CoV-2/FLU/RSV assay is intended as an aid in  the diagnosis of influenza from Nasopharyngeal swab specimens and  should not be used as a sole basis for treatment. Nasal washings and  aspirates are unacceptable for Xpert Xpress  SARS-CoV-2/FLU/RSV  testing.  Fact Sheet for Patients: PinkCheek.be  Fact Sheet for Healthcare Providers: GravelBags.it  This test is not yet approved or cleared by the [place] and  has been authorized for  detection and/or diagnosis of SARS-CoV-2 by  FDA under an Emergency Use Authorization (EUA). This EUA will remain  in effect (meaning this test can be used) for the duration of the  Covid-19 declaration under Section 564(b)(1) of the Act, 21  U.S.C. section 360bbb-3(b)(1), unless the authorization is  terminated or revoked. Performed at [facility], [address] [place], [place] [address]   Blood Culture (routine x 2)     Status: None   Collection Time: 06/15/20 10:09 PM   Specimen: BLOOD  Result Value Ref Range Status   Specimen Description BLOOD LEFT AC  Final   Special Requests   Final    BOTTLES DRAWN AEROBIC AND ANAEROBIC Blood Culture results may not be optimal due to an inadequate volume of blood received in culture bottles   Culture   Final    NO GROWTH 5 DAYS Performed at [facility], [address] [place], [place] [address]    Report Status 06/20/2020 FINAL  Final  Blood Culture (routine x 2)     Status: None   Collection Time: 06/15/20 10:11 PM   Specimen: BLOOD  Result Value Ref Range Status   Specimen Description BLOOD RIGHT [facility]  Final   Special Requests   Final    BOTTLES DRAWN AEROBIC AND ANAEROBIC Blood Culture adequate volume   Culture   Final    NO GROWTH 5 DAYS Performed at [facility], [address], [place], [place] [address]    Report Status 06/20/2020 FINAL  Final  Urine culture     Status: None   Collection Time: 06/15/20 11:40 PM   Specimen: Urine, Random  Result Value Ref Range Status   Specimen Description   Final    URINE, RANDOM Performed at [facility], [address], [place], [place] [address]    Special Requests    Final    NONE Performed at [facility], [address] [place], [place] [address]    Culture   Final    NO GROWTH Performed at [facility], [place] [address], [place], [place] [address]    Report Status 06/17/2020 FINAL  Final  Pleural Fluid culture (includes gram stain)     Status: None   Collection Time: 06/15/20 11:40 PM   Specimen: Pleural Fluid  Result Value Ref Range Status   Specimen Description   Final    PLEURAL Performed at [facility], [address], [place], [place] [address]    Special Requests   Final    NONE Performed at [facility], [place], [place] [address]    Gram Stain NO WBC SEEN NO ORGANISMS SEEN   Final   Culture   Final    NO GROWTH 3 DAYS Performed at [facility], [place] [address] [place], [place] [address]    Report Status 06/19/2020 FINAL  Final  MRSA PCR Screening     Status: None   Collection Time: 06/16/20 11:51 PM   Specimen: Nasopharyngeal  Result Value Ref Range Status   MRSA by PCR NEGATIVE NEGATIVE Final    Comment:        The GeneXpert MRSA Assay (FDA approved for NASAL specimens only), is one component of a comprehensive MRSA colonization surveillance program. It is not intended to diagnose MRSA infection nor to guide or monitor treatment for MRSA infections. Performed at [facility], [address] [place], [place] [address]   Aerobic/Anaerobic Culture (surgical/deep  wound)     Status: None (Preliminary result)   Collection Time: 06/20/20  3:20 PM   Specimen: Wound  Result Value Ref Range Status   Specimen Description   Final    WOUND Performed at [facility], [address], [place], [place] [address]    Special Requests   Final    RIGHT BREAST Performed at [facility], [address] [place], [place] [address]    Gram Stain   Final    RARE WBC PRESENT,BOTH PMN AND MONONUCLEAR NO ORGANISMS SEEN    Culture   Final     CULTURE REINCUBATED FOR BETTER GROWTH Performed at [facility], [address], [place], [place] [address]    Report Status PENDING  Incomplete      Scheduled Meds: . budesonide (PULMICORT) nebulizer solution  0.25 mg Nebulization BID  . Chlorhexidine Gluconate Cloth  6 each Topical Q0600  . docusate sodium  100 mg Oral BID  . ipratropium-albuterol  3 mL Nebulization Q4H  . melatonin  5 mg Oral QHS  . methylPREDNISolone (SOLU-MEDROL) injection  20 mg Intravenous BID  . sodium chloride flush  10-40 mL Intracatheter Q12H   Continuous Infusions: . lactated ringers Stopped (06/21/20 0855)  . linezolid (ZYVOX) IV 600 mg (06/22/20 1117)    Assessment/Plan:  1. Acute hypoxic respiratory failure.  Patient this morning on heated high flow nasal cannula 50 L 86% oxygen.  Now looks like on 100% oxygen.  Critical care specialist added Solu-Medrol. 2. Metastatic breast cancer.  Patient has right breast cancer extending to the skin.  Positive cytology with the pleural fluid.  Oncology following.  Palliative care following.  Currently full code. 3. Large right pleural effusion and hemothorax.  Chest tube currently in.  Dr. Genevive Bi planning on VATS procedure tomorrow. 4. Sepsis, present on admission with acute hypoxic respiratory failure.  Secondary to right breast cellulitis and pneumonia.  Patient on Maxipime and Zyvox. 5. Anemia.  Likely of chronic disease.  We will send off a ferritin tomorrow.  Continue to watch hemoglobin. 6. Tachycardia.  Continue to monitor closely.        Code Status:     Code Status Orders  (From admission, onward)         Start     Ordered   06/16/20 0010  Full code  Continuous        06/16/20 0010        Code Status History    Date Active Date Inactive Code Status Order ID Comments User Context   06/23/2015 2341 06/30/2015 1944 Full Code [id number]  Lance Coon, MD Inpatient   Advance Care Planning Activity    Advance Directive Documentation      Most Recent Value  Type of Advance Directive Living will, Healthcare Power of Attorney  Pre-existing out of facility DNR order (yellow form or pink MOST form) --  "MOST" Form in Place? --     Family Communication: Son at the bedside Disposition Plan: Status is: Inpatient  Dispo: The patient is from: Home              Anticipated d/c is to: Home              Anticipated d/c date is: Likely will need another week here in the hospital.              Patient currently on way too much oxygen to talk about disposition at this point.  Patient will  have a VATS procedure tomorrow.  Consultants:  Critical care specialist  Oncology  Cardiothoracic surgery  Procedures:  Right-sided chest tube  Antibiotics:  Zyvox  Cefepime  Time spent: 28 minutes  [place]

## 2020-06-22 NOTE — Progress Notes (Signed)
CRITICAL CARE NOTE  Jessica Willis is a 54 y.o. African American female with a past medical history significant for anemia and a right pleural empyema strep pyogenes in 2016. She presented to [facility] ED on 06/15/2020 with complaints of shortness of breath and progressive cough for the last week. She also admits to a large mass on the right breast and skin changes that have been worsening over the last 6 months, though she never sought medical evaluation. Upon presentation, patient was noted to have a large right breast mass with a duplicate mass above the breast which looked necrotic with bullae and purulent discharge.  Workup in the ED revealed the following: Temp 98.2, BP 180/107, HR 144, RR 38, SpO2 94% on room air, sodium 143, potassium 3.3, chloride 101, CO2 25, BUN 22, creatinine 0.61, lactic acid 2.8, glucose 146, and calcium 9.5. Chest CT angiogram showed no evidence of PE. There was one or possibly two separate large masses seen on the right breast with extension into the skin surface and associated skin thickening and right axillary adenopathy. These findings are consistent with right breast cancer. Imaging also shows a large right pleural effusion with mass-effect, left shift of the mediastinal structures, and collapse of the right lung. Thoracentesis was performed in the ED with the insertion of a pigtail catheter, which yielded 500 cc of serosanguineous fluid. Cytology was sent 06/15/20.   She was admitted by the Hospitalist for further workup and treatment of acute hypoxic respiratory failure in the setting of large right pleural effusion (suspected malignant pleural effusion) due to right breast mass and sepsis in the setting of right breast cellulitis. On 06/16/2020, she became tachycardic, tachypneic, with worsening hypoxia. Follow up chest x-ray showed persistent complete opacification of the right hemithorax along with a slight mediastinal shift to the left. General surgery was consulted to  evaluate need for larger chest tube placement. General surgery placed pigtail to suction and drained 3.5L of dark serosanguinous fluid (did not appear like fresh blood). Cardiothoracic surgery was also consulted to ensure no concern for active bleeding or need for possible surgical intervention. Serial chest x-rays showed that the pleural effusion was improving, and output has slowed considerably. Oncology and Palliative Care have also been consulted. CC  Follow up respiratory failure  SUBJECTIVE Patient remains critically ill Prognosis is guarded   BP 121/76   Pulse (!) 124   Temp (!) 97.2 F (36.2 C) (Axillary) Comment: pt feels comfortable  Resp (!) 30   Ht 5\' 10"  (1.778 m)   Wt 86.2 kg   LMP 06/16/2015 Comment: pt has had tubes tied   SpO2 92%   BMI 27.26 kg/m    I/O last 3 completed shifts: In: 1049.3 [I.V.:149.3; IV Piggyback:900] Out: 425 [Urine:300; Chest Tube:125] Total I/O In: 0  Out: 110 [Chest Tube:110]  SpO2: 92 % O2 Flow Rate (L/min): 50 L/min FiO2 (%): 70 % (Found pt on 70%)  Estimated body mass index is 27.26 kg/m as calculated from the following:   Height as of this encounter: 5\' 10"  (1.778 m).   Weight as of this encounter: 86.2 kg.  SIGNIFICANT EVENTS  11/3 - Right pigtail catheter inserted 11/3 - CTA chest: no evidence of PE; one or two large masses in right breast with extension to skin surface, compatible with right breast cancer; large right pleural effusion with mass effect and complete collapse/compression of right lung and left mediastinal shift; single enlarged left axillary lymph node 11/3 - CXR: right pigtail catheter placed  with complete opacification of right hemithorax; no visualized pneumothorax 11/4 - Venous US Left LE: no evidence of DVT 11/4 - CXR: small right pneumothorax with right-sided pleural effusion, pigtail catheter in place; interval decrease in a large right pleural fluid collection (effusion vs hemothorax); severe airspace  and interstitial opacities on the right, mild interstitial opacities on the left 11/5 - CXR: slightly improved, almost complete whiteout of right chest, slightly decreased size of pleural effusion; interval increase in interstitial and alveolar left lung opacities and possible trace left pleural effusion 11/5 - Currently on 8L [place]; hemodynamically stable on pressors, afebrile 11/6 - CXR: persistent subtotal opacification of right hemithorax by effusion and atelectasis, underlying tumor not excluded; persistent infiltrate and probable pleural effusion at inferior left chest 11/6 - Now on BiPAP, continued respiratory failure, pigtail catheter drains slowly and intermittently, has been blocked in the past 11/7 - CXR: near complete opacification of right hemithorax with minimally improved central right lung aeration; stable left retrocardiac consolidation, favor atelectasis; fluffy left parahilar lung opacities, slightly worsened, favor pulmonary edema 11/7 - Using BiPAP at night. Off BiPAP she appears better but is hypoxemic on 15L nasal cannula with SpO2 79-85%; able to eat today and in less distress; large bore chest tube held off due to tenuous respiratory status and significant tumor infiltration into right chest wall. General surgery prefers thoracic surgery evaluation for possible VATS and large bore tube thoracostomy. This would be for palliative purposes only - patient and family aware of this and wish to proceed. Still full code. 11/8 - Patient switched to HF [place] and appears to be much more comfortable; sitting in bed eating breakfast this morning and reports no new complaints; Dr. Genevive Bi was by to discuss surgery 11/9 - Patient is on HF [place] and does not report any new symptoms/concerns. Started bronchodilators and steroids. 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 VATS procedure scheduled for 06/23/20 with Dr. Genevive Bi. 06/22/20- patient on 70% HFNC sinus tach 120s, AAx3,  LUE midline +, plan for thoracotomy with pleurodesis tommorow.     REVIEW OF SYSTEMS  ROS limited due to critical illness. General: improved appetite, improved fatigue, unexpected weight change Respiratory: positive for cough and mild shortness of breath  Cardiovascular: negative for palpitations, chest pain, lower extremity swelling Gastrointestinal: positive for gassiness; negative for abdominal pain, nausea/vomiting Musculoskeletal: movement exacerbates pain and coughing Neurological: negative for lightheadedness, syncope, headaches  PHYSICAL EXAMINATION  GENERAL: Frail, alert, calm and comfortable, no acute distress HEAD: Normocephalic, atraumatic.  EYES: Pupils equal, round, reactive to light.  No scleral icterus.  MOUTH: Moist mucosal membrane. NECK: Supple, no JVD PULMONARY: Lungs clear to auscultation bilaterally; deep breaths provoke cough; tachypnea present; chest tube with serosanguinous drainage CARDIOVASCULAR: S1 and S2. Tachycardia present. No murmurs, rubs, or gallops.  GASTROINTESTINAL: Soft, nontender, non-distended.  Positive bowel sounds.   MUSCULOSKELETAL: No swelling, clubbing, or edema.  NEUROLOGIC: No focal deficits present, alert and oriented x 3 PSYCHIATRIC: Normal mood and affect, not confused SKIN: Significant tumor infiltration from right breast with necrotic fungating mass, no active bleeding or purulent discharge  MEDICATIONS: I have reviewed all medications and confirmed regimen as documented   Scheduled Meds: . budesonide (PULMICORT) nebulizer solution  0.25 mg Nebulization BID  . Chlorhexidine Gluconate Cloth  6 each Topical Q0600  . docusate sodium  100 mg Oral BID  . ipratropium-albuterol  3 mL Nebulization Q4H  . melatonin  5 mg Oral QHS  . methylPREDNISolone (SOLU-MEDROL) injection  20 mg Intravenous  BID  . sodium chloride flush  10-40 mL Intracatheter Q12H   Continuous Infusions: . lactated ringers Stopped (06/21/20 0855)  .  linezolid (ZYVOX) IV 600 mg (06/21/20 2212)   PRN Meds:.acetaminophen **OR** acetaminophen, LORazepam, metoprolol tartrate, morphine injection, ondansetron **OR** ondansetron (ZOFRAN) IV, polyethylene glycol, simethicone, sodium chloride flush, traMADol, traZODone   CULTURE RESULTS   Recent Results (from the past 240 hour(s))  Respiratory Panel by RT PCR (Flu A&B, Covid) - Nasopharyngeal Swab     Status: None   Collection Time: 06/15/20  9:25 PM   Specimen: Nasopharyngeal Swab  Result Value Ref Range Status   SARS Coronavirus 2 by RT PCR NEGATIVE NEGATIVE Final    Comment: (NOTE) SARS-CoV-2 target nucleic acids are NOT DETECTED.  The SARS-CoV-2 RNA is generally detectable in upper respiratoy specimens during the acute phase of infection. The lowest concentration of SARS-CoV-2 viral copies this assay can detect is 131 copies/mL. A negative result does not preclude SARS-Cov-2 infection and should not be used as the sole basis for treatment or other patient management decisions. A negative result may occur with  improper specimen collection/handling, submission of specimen other than nasopharyngeal swab, presence of viral mutation(s) within the areas targeted by this assay, and inadequate number of viral copies (<131 copies/mL). A negative result must be combined with clinical observations, patient history, and epidemiological information. The expected result is Negative.  Fact Sheet for Patients:  PinkCheek.be  Fact Sheet for Healthcare Providers:  GravelBags.it  This test is no t yet approved or cleared by the [place] FDA and  has been authorized for detection and/or diagnosis of SARS-CoV-2 by FDA under an Emergency Use Authorization (EUA). This EUA will remain  in effect (meaning this test can be used) for the duration of the COVID-19 declaration under Section 564(b)(1) of the Act, 21 U.S.C. section 360bbb-3(b)(1),  unless the authorization is terminated or revoked sooner.     Influenza A by PCR NEGATIVE NEGATIVE Final   Influenza B by PCR NEGATIVE NEGATIVE Final    Comment: (NOTE) The Xpert Xpress SARS-CoV-2/FLU/RSV assay is intended as an aid in  the diagnosis of influenza from Nasopharyngeal swab specimens and  should not be used as a sole basis for treatment. Nasal washings and  aspirates are unacceptable for Xpert Xpress SARS-CoV-2/FLU/RSV  testing.  Fact Sheet for Patients: PinkCheek.be  Fact Sheet for Healthcare Providers: GravelBags.it  This test is not yet approved or cleared by the [place] FDA and  has been authorized for detection and/or diagnosis of SARS-CoV-2 by  FDA under an Emergency Use Authorization (EUA). This EUA will remain  in effect (meaning this test can be used) for the duration of the  Covid-19 declaration under Section 564(b)(1) of the Act, 21  U.S.C. section 360bbb-3(b)(1), unless the authorization is  terminated or revoked. Performed at [facility], [address] [place], [place] [address]   Blood Culture (routine x 2)     Status: None   Collection Time: 06/15/20 10:09 PM   Specimen: BLOOD  Result Value Ref Range Status   Specimen Description BLOOD LEFT AC  Final   Special Requests   Final    BOTTLES DRAWN AEROBIC AND ANAEROBIC Blood Culture results may not be optimal due to an inadequate volume of blood received in culture bottles   Culture   Final    NO GROWTH 5 DAYS Performed at [facility], [address], [place],  [address]    Report Status 06/20/2020 FINAL  Final  Blood Culture (routine x 2)     Status: None   Collection Time: 06/15/20 10:11 PM   Specimen: BLOOD  Result Value Ref Range Status   Specimen Description BLOOD RIGHT [facility]  Final   Special Requests   Final    BOTTLES DRAWN AEROBIC AND ANAEROBIC Blood Culture adequate volume   Culture   Final     NO GROWTH 5 DAYS Performed at [facility], [address], [place], [place] [address]    Report Status 06/20/2020 FINAL  Final  Urine culture     Status: None   Collection Time: 06/15/20 11:40 PM   Specimen: Urine, Random  Result Value Ref Range Status   Specimen Description   Final    URINE, RANDOM Performed at [facility], [address] [place], [place] [address]    Special Requests   Final    NONE Performed at [facility], [address] [place], [place] [address]    Culture   Final    NO GROWTH Performed at [facility], [place] [address], [place], [place] [address]    Report Status 06/17/2020 FINAL  Final  Pleural Fluid culture (includes gram stain)     Status: None   Collection Time: 06/15/20 11:40 PM   Specimen: Pleural Fluid  Result Value Ref Range Status   Specimen Description   Final    PLEURAL Performed at [facility], [address] [place], [place] [address]    Special Requests   Final    NONE Performed at [facility], [place], [place] [address]    Gram Stain NO WBC SEEN NO ORGANISMS SEEN   Final   Culture   Final    NO GROWTH 3 DAYS Performed at [facility], [place] [address] [place], [place] [address]    Report Status 06/19/2020 FINAL  Final  MRSA PCR Screening     Status: None   Collection Time: 06/16/20 11:51 PM   Specimen: Nasopharyngeal  Result Value Ref Range Status   MRSA by PCR NEGATIVE NEGATIVE Final    Comment:        The GeneXpert MRSA Assay (FDA approved for NASAL specimens only), is one component of a comprehensive MRSA colonization surveillance program. It is not intended to diagnose MRSA infection nor to guide or monitor treatment for MRSA infections. Performed at [facility], [address] [place], [place] [address]   Aerobic/Anaerobic Culture (surgical/deep wound)     Status: None (Preliminary result)   Collection Time: 06/20/20   3:20 PM   Specimen: Wound  Result Value Ref Range Status   Specimen Description   Final    WOUND Performed at [facility], [address], [place], [place] [address]    Special Requests   Final    RIGHT BREAST Performed at [facility], [place],  [address]    Gram Stain   Final    RARE WBC PRESENT,BOTH PMN AND MONONUCLEAR NO ORGANISMS SEEN    Culture   Final    NO GROWTH < 24 HOURS Performed at [facility], [place] [address], [place],  [address]    Report Status PENDING  Incomplete     LABS   CBC Latest Ref Rng & Units 06/22/2020 06/21/2020 06/20/2020  WBC 4.0 - 10.5 K/uL 10.0 9.4 11.7(H)  Hemoglobin 12.0 - 15.0 g/dL 9.6(L) 9.4(L) 9.7(L)  Hematocrit 36 - 46 %  31.1(L) 30.7(L) 31.1(L)  Platelets 150 - 400 K/uL 378 317 325    BMP Latest Ref Rng & Units 06/22/2020 06/21/2020 06/20/2020  Glucose 70 - 99 mg/dL 170(H) 100(H) 95  BUN 6 - 20 mg/dL 11 9 12   Creatinine 0.44 - 1.00 mg/dL 0.46 0.34(L) 0.52  Sodium 135 - 145 mmol/L 138 138 138  Potassium 3.5 - 5.1 mmol/L 3.9 3.7 4.3  Chloride 98 - 111 mmol/L 97(L) 100 101  CO2 22 - 32 mmol/L 29 30 28   Calcium 8.9 - 10.3 mg/dL 8.7(L) 8.4(L) 8.5(L)    Intake/Output Summary (Last 24 hours) at 06/22/2020 1003 Last data filed at 06/22/2020 0900 Gross per 24 hour  Intake 404.49 ml  Output 160 ml  Net 244.49 ml    IMAGING   No results found.  Best Practice: Diet: Regular Diet Pain/Anxiety/Delirium protocol (if indicated): Prn Morphine VAP protocol (if indicated): N/A DVT prophylaxis: Lovenox GI prophylaxis: N/A Glucose control: N/A  Mobility: As tolerated  Code Status: full code   ASSESSMENT AND PLAN SYNOPSIS 54 y.o. female presents with acute hypoxic respiratory failure in the setting of large right malignant pleural effusion. Complex right breast mass is present infiltrating into right chest wall with significant overlying cellulitic skin changes. Cytology reporting  metastatic adenocarcinoma. Currently on HF [place] and scheduled to undergo VATS procedure with Dr. Genevive Bi on Thursday 06/23/2020.  ACUTE HYPOXIC RESPIRATORY FAILURE in the setting of large right malignant pleural effusion  - Status post thoracentesis and chest tube insertion - Initial concern for hemothorax, fluid from pigtail catheter was serosanguinous (though didn't appear to be fresh blood), drainage has slowed considerably, H&H has remained stable - In need of large bore thoracostomy with or without VATS (likely will need to be done by thoracic surgery under intubation due to significant tumor infiltration into right chest wall) - Heated high flow oxygen by nasal cannula (appears to be working better than BiPAP) - Start bronchodilators and Solumedrol - Intubate if worse -> mechanically ventilate -> then CCM can place large bore chest tube - Scheduled for VATS procedure 06/23/2020 with Dr. Genevive Bi  RIGHT BREAST MASS - Poor prognosis - Cytology positive for malignancy (metastatic adenocarcinoma, favor mammary primary) - General surgery signed off 06/20/2020 - Oncology following - Palliative care following  SEPSIS secondary to right breast cellulitis - Sepsis present on admission with tachycardia, tachypnea, leukocytosis -completed IV antibiotics   ELECTROLYTES - Follow labs as needed - Replace as needed - Pharmacy consultation and following   DVT/GI PRX ordered and assessed TRANSFUSIONS AS NEEDED MONITOR FSBS I Assessed the need for Labs I Assessed the need for Foley I Assessed the need for Central Venous Line Family Discussion when available I Assessed the need for Mobilization I made an Assessment of medications to be adjusted accordingly Safety Risk assessment completed   CASE DISCUSSED IN MULTIDISCIPLINARY ROUNDS WITH ICU TEAM  Critical Care Time devoted to patient care services described in this note is  33 minutes.   Overall, patient is critically ill, prognosis is  guarded.      Ottie Glazier, M.D.  Pulmonary & [place]

## 2020-06-23 ENCOUNTER — Encounter
Admission: EM | Disposition: A | Discharge status: Home Health | Payer: Self-pay | Source: Home / Self Care | Attending: Internal Medicine

## 2020-06-23 ENCOUNTER — Other Ambulatory Visit: Payer: Self-pay

## 2020-06-23 ENCOUNTER — Inpatient Hospital Stay: Payer: Medicaid Other | Admitting: Anesthesiology

## 2020-06-23 ENCOUNTER — Inpatient Hospital Stay: Payer: Medicaid Other

## 2020-06-23 DIAGNOSIS — J910 Malignant pleural effusion: Secondary | ICD-10-CM

## 2020-06-23 DIAGNOSIS — D509 Iron deficiency anemia, unspecified: Secondary | ICD-10-CM

## 2020-06-23 HISTORY — PX: VIDEO ASSISTED THORACOSCOPY (VATS)/THOROCOTOMY: SHX6173

## 2020-06-23 LAB — PHOSPHORUS: Phosphorus: 2.7 mg/dL (ref 2.5–4.6)

## 2020-06-23 LAB — BASIC METABOLIC PANEL
Anion gap: 8 (ref 5–15)
BUN: 13 mg/dL (ref 6–20)
CO2: 32 mmol/L (ref 22–32)
Calcium: 8.6 mg/dL — ABNORMAL LOW (ref 8.9–10.3)
Chloride: 100 mmol/L (ref 98–111)
Creatinine, Ser: 0.43 mg/dL — ABNORMAL LOW (ref 0.44–1.00)
GFR, Estimated: >60 mL/min (ref >60)
Glucose, Bld: 147 mg/dL — ABNORMAL HIGH (ref 70–99)
Potassium: 4 mmol/L (ref 3.5–5.1)
Sodium: 140 mmol/L (ref 135–145)

## 2020-06-23 LAB — CBC
HCT: 28.8 % — ABNORMAL LOW (ref 36.0–46.0)
Hemoglobin: 8.9 g/dL — ABNORMAL LOW (ref 12.0–15.0)
MCH: 25.6 pg — ABNORMAL LOW (ref 26.0–34.0)
MCHC: 30.9 g/dL (ref 30.0–36.0)
MCV: 82.8 fL (ref 80.0–100.0)
Platelets: 361 10*3/uL (ref 150–400)
RBC: 3.48 MIL/uL — ABNORMAL LOW (ref 3.87–5.11)
RDW: 12.6 % (ref 11.5–15.5)
WBC: 12 10*3/uL — ABNORMAL HIGH (ref 4.0–10.5)
nRBC: 0 % (ref 0.0–0.2)

## 2020-06-23 LAB — FERRITIN: Ferritin: 198 ng/mL (ref 11–307)

## 2020-06-23 LAB — MAGNESIUM: Magnesium: 2.4 mg/dL (ref 1.7–2.4)

## 2020-06-23 IMAGING — DX DG CHEST 1V PORT
1 series · 1 of 1 positions shown · non-contrast
Comparison: [DATE].

CLINICAL DATA: Status post video-assisted thoracoscopy.

EXAM:
PORTABLE CHEST 1 VIEW

[chest ap]
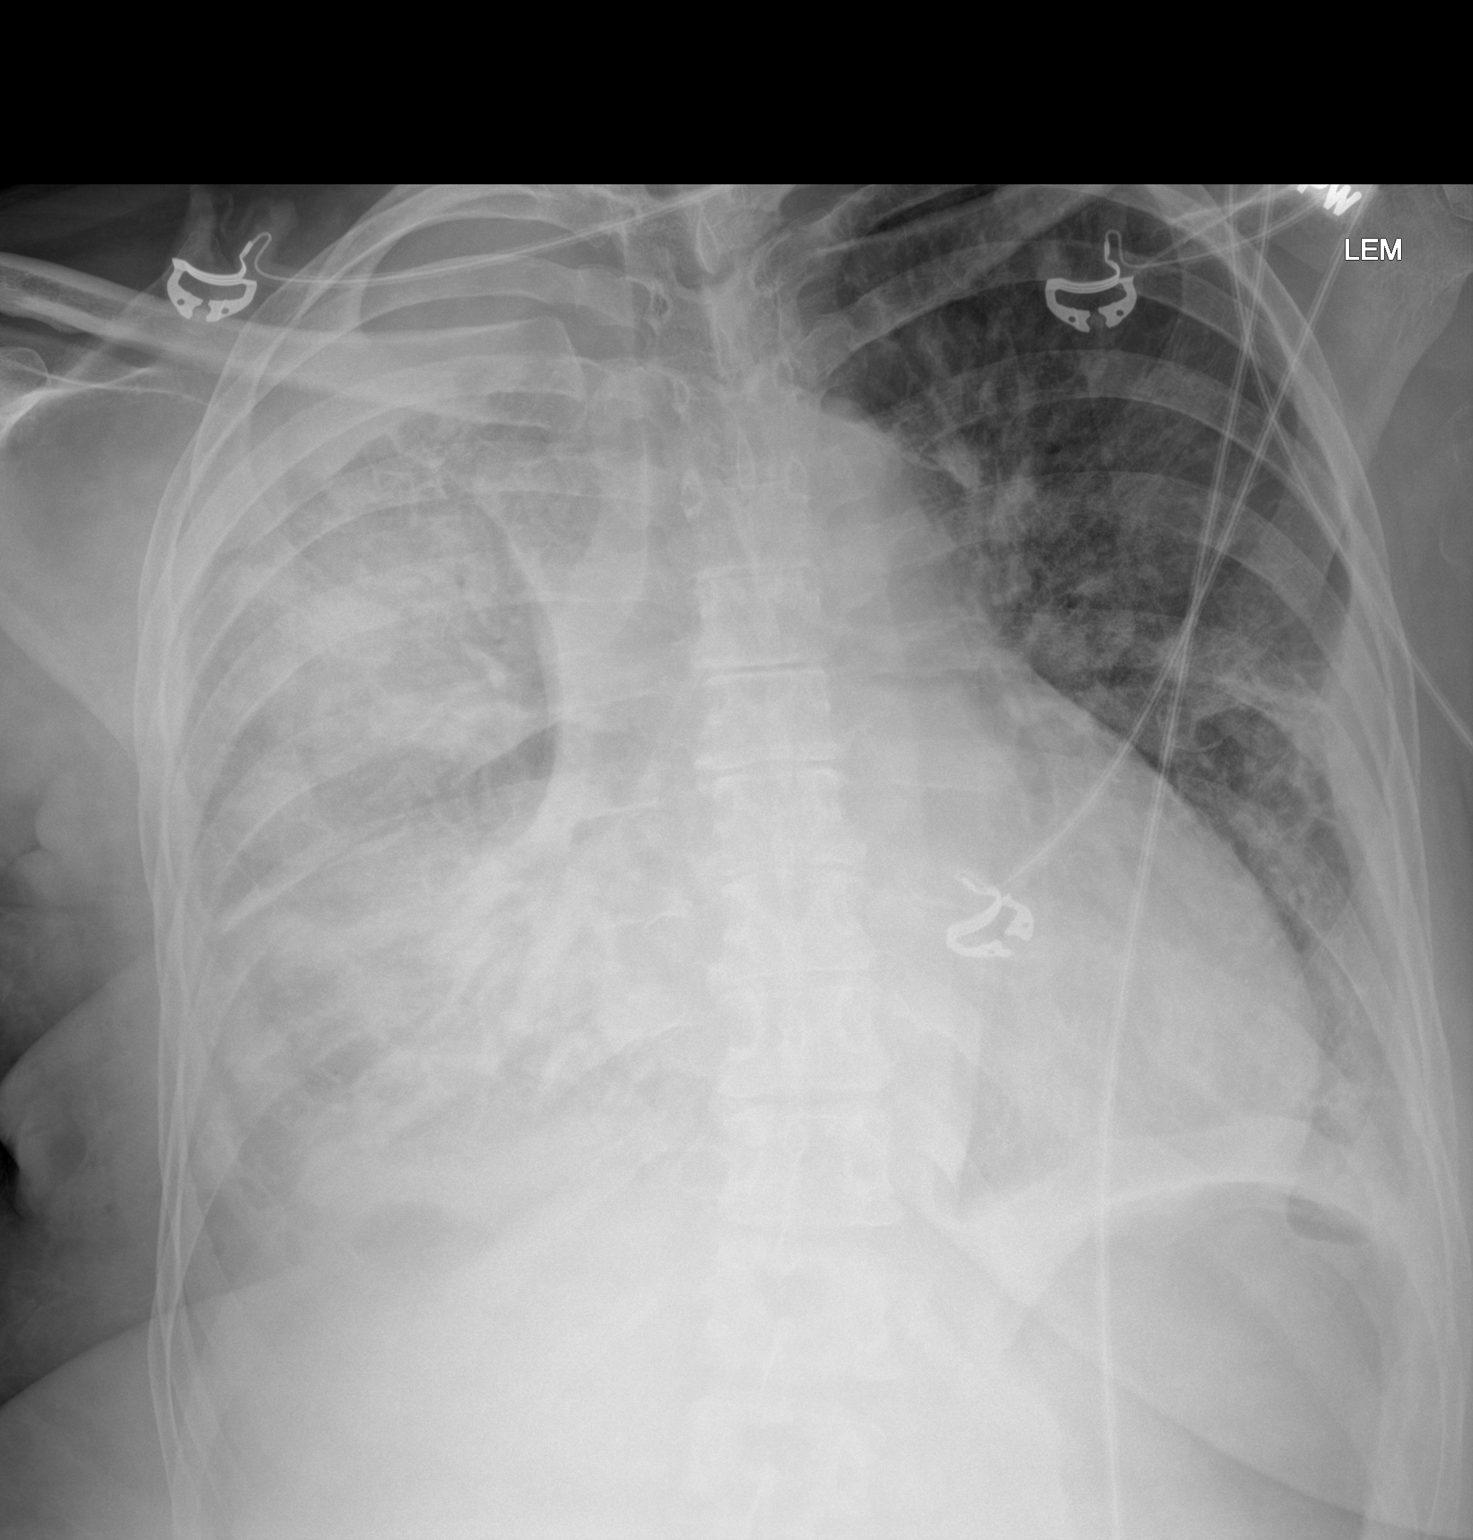

[1 of 1 positions shown; findings below may reference images not displayed]

FINDINGS: Stable cardiomegaly central pulmonary vascular congestion is noted.
Right-sided chest tube has been removed. No pneumothorax is noted.
Stable diffuse right lung opacity is noted concerning for pneumonia
with associated pleural effusion, including loculated fluid and
right lung apex. Small loculated left pleural effusion is noted
laterally. Left perihilar and basilar opacity is noted concerning
for infiltrate or possibly edema. Bony thorax is unremarkable.
IMPRESSION: Stable diffuse right lung opacity is noted concerning for pneumonia
with associated pleural effusion, including loculated fluid and
right lung apex. Small loculated left pleural effusion is noted
laterally. Left perihilar and basilar opacity is noted concerning
for infiltrate or possibly edema.

## 2020-06-23 SURGERY — VIDEO ASSISTED THORACOSCOPY (VATS)/THOROCOTOMY
Anesthesia: General | Site: Chest | Laterality: Right

## 2020-06-23 MED ORDER — MIDAZOLAM HCL 2 MG/2ML IJ SOLN
INTRAMUSCULAR | Status: AC
  Filled 2020-06-23: qty 2

## 2020-06-23 MED ORDER — DEXAMETHASONE SODIUM PHOSPHATE 10 MG/ML IJ SOLN
INTRAMUSCULAR | Status: DC | PRN
  Administered 2020-06-23: 10 mg via INTRAVENOUS

## 2020-06-23 MED ORDER — CEFAZOLIN SODIUM 1 G IJ SOLR
INTRAMUSCULAR | Status: AC
  Filled 2020-06-23: qty 10

## 2020-06-23 MED ORDER — ACETAMINOPHEN 10 MG/ML IV SOLN
INTRAVENOUS | Status: DC | PRN
  Administered 2020-06-23: 1000 mg via INTRAVENOUS

## 2020-06-23 MED ORDER — ROCURONIUM BROMIDE 100 MG/10ML IV SOLN
INTRAVENOUS | Status: DC | PRN
  Administered 2020-06-23: 20 mg via INTRAVENOUS
  Administered 2020-06-23: 50 mg via INTRAVENOUS

## 2020-06-23 MED ORDER — LIDOCAINE HCL (CARDIAC) PF 100 MG/5ML IV SOSY
PREFILLED_SYRINGE | INTRAVENOUS | Status: DC | PRN
  Administered 2020-06-23: 50 mg via INTRAVENOUS

## 2020-06-23 MED ORDER — ACETAMINOPHEN 10 MG/ML IV SOLN
INTRAVENOUS | Status: AC
  Filled 2020-06-23: qty 100

## 2020-06-23 MED ORDER — CEFAZOLIN SODIUM-DEXTROSE 1-4 GM/50ML-% IV SOLN
INTRAVENOUS | Status: DC | PRN
  Administered 2020-06-23: 1 g via INTRAVENOUS

## 2020-06-23 MED ORDER — PROPOFOL 10 MG/ML IV BOLUS
INTRAVENOUS | Status: DC | PRN
  Administered 2020-06-23: 100 mg via INTRAVENOUS

## 2020-06-23 MED ORDER — PROPOFOL 10 MG/ML IV BOLUS
INTRAVENOUS | Status: AC
  Filled 2020-06-23: qty 20

## 2020-06-23 MED ORDER — SUGAMMADEX SODIUM 200 MG/2ML IV SOLN
INTRAVENOUS | Status: DC | PRN
  Administered 2020-06-23: 200 mg via INTRAVENOUS

## 2020-06-23 MED ORDER — ORAL CARE MOUTH RINSE
15.0000 mL | Freq: Two times a day (BID) | OROMUCOSAL | Status: DC
  Administered 2020-06-25 – 2020-07-01 (×12): 15 mL via OROMUCOSAL

## 2020-06-23 MED ORDER — ONDANSETRON HCL 4 MG/2ML IJ SOLN
INTRAMUSCULAR | Status: DC | PRN
  Administered 2020-06-23: 4 mg via INTRAVENOUS

## 2020-06-23 MED ORDER — FENTANYL CITRATE (PF) 100 MCG/2ML IJ SOLN
INTRAMUSCULAR | Status: DC | PRN
  Administered 2020-06-23: 50 ug via INTRAVENOUS

## 2020-06-23 MED ORDER — LACTATED RINGERS IV SOLN: INTRAVENOUS | Status: DC | PRN

## 2020-06-23 MED ORDER — FENTANYL CITRATE (PF) 100 MCG/2ML IJ SOLN
INTRAMUSCULAR | Status: AC
  Filled 2020-06-23: qty 2

## 2020-06-23 SURGICAL SUPPLY — 56 items
APL PRP STRL LF DISP 70% ISPRP (MISCELLANEOUS) ×2
BRONCHOSCOPE BFLEX 3.8 (MISCELLANEOUS) ×2 IMPLANT
CATH URET ROBINSON 16FR STRL (CATHETERS) IMPLANT
CHLORAPREP W/TINT 26 (MISCELLANEOUS) ×4 IMPLANT
CONN REDUCER 1/4X3/8 STR (CONNECTOR) ×2
CONNECTOR REDUCER 1/4X3/8 STR (CONNECTOR) ×1 IMPLANT
CUTTER ECHEON FLEX ENDO 45 340 (ENDOMECHANICALS) IMPLANT
DEFOGGER SCOPE WARMER CLEARIFY (MISCELLANEOUS) ×2 IMPLANT
DRAIN CHEST DRY SUCT SGL (MISCELLANEOUS) ×2 IMPLANT
DRAPE C-SECTION (MISCELLANEOUS) ×2 IMPLANT
DRAPE MAG INST 16X20 L/F (DRAPES) ×2 IMPLANT
DRSG OPSITE POSTOP 3X4 (GAUZE/BANDAGES/DRESSINGS) ×6 IMPLANT
ELECT BLADE 6 FLAT ULTRCLN (ELECTRODE) ×2 IMPLANT
ELECT BLADE 6.5 EXT (BLADE) ×2 IMPLANT
ELECT CAUTERY BLADE TIP 2.5 (TIP) ×2
ELECT REM PT RETURN 9FT ADLT (ELECTROSURGICAL) ×2
ELECTRODE CAUTERY BLDE TIP 2.5 (TIP) ×1 IMPLANT
ELECTRODE REM PT RTRN 9FT ADLT (ELECTROSURGICAL) ×1 IMPLANT
GAUZE SPONGE 4X4 12PLY STRL (GAUZE/BANDAGES/DRESSINGS) ×2 IMPLANT
GLOVE SURG SYN 7.5  E (GLOVE) ×2
GLOVE SURG SYN 7.5 E (GLOVE) ×2 IMPLANT
GOWN STRL REUS W/ TWL LRG LVL3 (GOWN DISPOSABLE) ×4 IMPLANT
GOWN STRL REUS W/TWL LRG LVL3 (GOWN DISPOSABLE) ×8
KIT TURNOVER KIT A (KITS) ×2 IMPLANT
LABEL OR SOLS (LABEL) ×2 IMPLANT
LOOP RED MAXI  1X406MM (MISCELLANEOUS) ×1
LOOP VESSEL MAXI 1X406 RED (MISCELLANEOUS) ×1 IMPLANT
MANIFOLD NEPTUNE II (INSTRUMENTS) ×2 IMPLANT
MARKER SKIN DUAL TIP RULER LAB (MISCELLANEOUS) ×2 IMPLANT
NEEDLE SPNL 20GX3.5 QUINCKE YW (NEEDLE) ×2 IMPLANT
NS IRRIG 500ML POUR BTL (IV SOLUTION) ×2 IMPLANT
PACK BASIN MAJOR ARMC (MISCELLANEOUS) ×2 IMPLANT
SCISSORS METZENBAUM CVD 33 (INSTRUMENTS) ×2 IMPLANT
SPONGE KITTNER 5P (MISCELLANEOUS) ×2 IMPLANT
STAPLER SKIN PROX 35W (STAPLE) IMPLANT
STAPLER VASCULAR ECHELON 35 (CUTTER) IMPLANT
SUT ETHILON 4-0 (SUTURE)
SUT ETHILON 4-0 FS2 18XMFL BLK (SUTURE)
SUT MNCRL 4-0 (SUTURE) ×4
SUT MNCRL 4-0 27XMFL (SUTURE) ×2
SUT SILK 1 SH (SUTURE) ×6 IMPLANT
SUT VIC AB 0 CT1 36 (SUTURE) ×4 IMPLANT
SUT VIC AB 2-0 CT1 27 (SUTURE) ×4
SUT VIC AB 2-0 CT1 TAPERPNT 27 (SUTURE) ×2 IMPLANT
SUT VICRYL 2 TP 1 (SUTURE) IMPLANT
SUTURE ETHLN 4-0 FS2 18XMF BLK (SUTURE) IMPLANT
SUTURE MNCRL 4-0 27XMF (SUTURE) ×2 IMPLANT
SYR 20ML LL LF (SYRINGE) ×4 IMPLANT
SYR BULB IRRIG 60ML STRL (SYRINGE) ×2 IMPLANT
TAPE CLOTH 3X10 WHT NS LF (GAUZE/BANDAGES/DRESSINGS) ×2 IMPLANT
TAPE TRANSPORE STRL 2 31045 (GAUZE/BANDAGES/DRESSINGS) IMPLANT
TRAY FOLEY MTR SLVR 16FR STAT (SET/KITS/TRAYS/PACK) ×2 IMPLANT
TROCAR FLEXIPATH 20X80 (ENDOMECHANICALS) IMPLANT
TROCAR FLEXIPATH THORACIC 15MM (ENDOMECHANICALS) IMPLANT
WATER STERILE IRR 1000ML POUR (IV SOLUTION) ×2 IMPLANT
YANKAUER SUCT BULB TIP FLEX NO (MISCELLANEOUS) ×2 IMPLANT

## 2020-06-23 NOTE — Op Note (Signed)
  06/23/2020  2:34 PM  PATIENT:  Jessica Willis  54 y.o. female  PRE-OPERATIVE DIAGNOSIS: Malignant pleural effusion right side  POST-OPERATIVE DIAGNOSIS: Malignant pleural effusion right side  PROCEDURE: Attempted right thoracoscopy for talc pleurodesis  SURGEON:  Surgeon(s) and Role:    Nestor Lewandowsky, MD - Primary  ASSISTANTS: Loree Fee, PA  ANESTHESIA: General anesthesia  INDICATIONS FOR PROCEDURE this patient is a 54 year old African-American female with a very large right breast cancer who presented with a malignant pleural effusion on the right.  She is status post percutaneous drainage.  The drainage has been about 200 cc on average for the last several days and she was offered a thoracoscopic talc pleurodesis for definitive management of her malignant pleural effusion.  Risks of the surgical procedure were explained in detail to the patient.  She gave her informed consent.  DICTATION: The patient was brought to the operating suite and placed in the supine position.  General endotracheal anesthesia was given with a double-lumen tube.  Our anesthesiologist performed a preoperative bronchoscopy to be certain that the tube is in the correct spot.  The patient was then turned for a right thoracoscopy.  The previous chest tube was removed.  Careful palpation of the chest wall revealed extensive edema and peau d'orange skin changes extending anteriorly and under the axilla as well as laterally.  We selected a potential site for the thoracoscopy 1 interspace below the previous percutaneous drain.  In this area the skin did appear to be more normal.  The patient was then prepped and draped in usual sterile fashion.  We began by making a small skin incision over our previously selected site.  We deepened the incision down through the muscles of the chest wall.  The intercostal spaces were extremely close together.  Palpation of these interspaces demonstrated a very small window.  I was  not even certain I could get a 5 mm scope into the pleural space.  We attempted to dissect down through the intercostal muscles but it was clear that there was no space between the selected interspace for a scope to be inserted.  Palpation of the chest wall revealed all the available rib spaces to be very small and could not accommodate a 5 mm scope.  I then placed my finger underneath the muscles of the chest wall and extended them superiorly and inferiorly for approximately 2 interspaces at all directions.  At no point was there a space available for insertion of the scope.  We therefore elected to just close the incision.  Hemostasis was completed.  Electrocautery was used on small bleeding points.  The muscles of the chest wall were closed with 0 Vicryl the subcutaneous tissues with 2-0 Vicryl and the skin with 4-0 Monocryl.  The patient was then rolled in the supine position where she was extubated.  Some Telfa and gauze pads were placed over her breast wound.  These were secured.  She was then taken to the intensive care unit in stable condition.   Nestor Lewandowsky, MD

## 2020-06-23 NOTE — Progress Notes (Signed)
  June 23, 2020  Patient: Jessica Willis  Date of Birth: Jan 04, 1966  Date of Visit: 06/15/2020-Present    To Whom It May Concern:  Jessica Willis is currently a patient in the intensive care unit at [facility]. She underwent a surgical procedure today. Please excuse her daughter from work who was present at her bedside.  Sincerely,

## 2020-06-23 NOTE — Anesthesia Procedure Notes (Signed)
Procedure Name: Intubation Date/Time: 06/23/2020 12:54 PM Performed by: Jonna Clark, CRNA Pre-anesthesia Checklist: Patient identified, Patient being monitored, Timeout performed, Emergency Drugs available and Suction available Patient Re-evaluated:Patient Re-evaluated prior to induction Oxygen Delivery Method: Circle system utilized Preoxygenation: Pre-oxygenation with 100% oxygen Induction Type: IV induction Ventilation: Mask ventilation without difficulty Laryngoscope Size: 3 and McGraph Grade View: Grade I Endobronchial tube: Left, EBT position confirmed by fiberoptic bronchoscope, EBT position confirmed by auscultation and Double lumen EBT and 37 Fr Number of attempts: 1 Airway Equipment and Method: Stylet Placement Confirmation: ETT inserted through vocal cords under direct vision,  positive ETCO2 and breath sounds checked- equal and bilateral Secured at: 28 cm Tube secured with: Tape Dental Injury: Teeth and Oropharynx as per pre-operative assessment

## 2020-06-23 NOTE — Progress Notes (Signed)
Patient ID: Jessica Willis, female   DOB: December 07, 1965, 54 y.o.   MRN: [id number] [facility] PROGRESS NOTE  Tifanie Gardiner OEU:[id number] DOB: 27-Dec-1965 DOA: 06/15/2020 PCP: System, Provider Not In  HPI/Subjective: Patient seen this morning and awakened from sleep.  Feels okay.  Dry mouth.  Some shortness of breath.  Some cough.  She stated that her procedures can be around 12 noon.  Patient still on heated high flow nasal cannula but down on the oxygen requirements to 55% FiO2.  Objective: Vitals:   06/23/20 0303 06/23/20 0500  BP:  122/77  Pulse:  (!) 109  Resp:  (!) 22  Temp:  99.3 F (37.4 C)  SpO2: 96% 96%    Intake/Output Summary (Last 24 hours) at 06/23/2020 0744 Last data filed at 06/23/2020 0500 Gross per 24 hour  Intake 600 ml  Output 1015 ml  Net -415 ml   Filed Weights   06/15/20 2114  Weight: 86.2 kg    ROS: Review of Systems  Respiratory: Positive for cough and shortness of breath.   Cardiovascular: Negative for palpitations.  Gastrointestinal: Negative for abdominal pain, nausea and vomiting.   Exam: Physical Exam HENT:     Head: Normocephalic.     Mouth/Throat:     Pharynx: No oropharyngeal exudate.  Eyes:     General: Lids are normal.     Conjunctiva/sclera: Conjunctivae normal.     Pupils: Pupils are equal, round, and reactive to light.  Cardiovascular:     Rate and Rhythm: Regular rhythm. Tachycardia present.     Heart sounds: Normal heart sounds, S1 normal and S2 normal.  Pulmonary:     Breath sounds: Examination of the right-middle field reveals decreased breath sounds and rhonchi. Examination of the right-lower field reveals decreased breath sounds and rhonchi. Examination of the left-lower field reveals decreased breath sounds and rhonchi. Decreased breath sounds and rhonchi present. No wheezing.  Abdominal:     Palpations: Abdomen is soft.     Tenderness: There is no abdominal tenderness.  Musculoskeletal:     Right lower  leg: No swelling.     Left lower leg: No swelling.  Skin:    General: Skin is warm.     Findings: No rash.  Neurological:     Mental Status: She is alert and oriented to person, place, and time.       Data Reviewed: Basic Metabolic Panel: Recent Labs  Lab 06/17/20 0453 06/17/20 0453 06/18/20 0529 06/18/20 0529 06/19/20 0450 06/20/20 0506 06/21/20 0636 06/22/20 0627 06/23/20 0439  NA 135   < > 134*   < > 136 138 138 138 140  K 4.4   < > 4.1   < > 4.1 4.3 3.7 3.9 4.0  CL 102   < > 99   < > 101 101 100 97* 100  CO2 19*   < > 28   < > 30 28 30 29  32  GLUCOSE 119*   < > 106*   < > 102* 95 100* 170* 147*  BUN 17   < > 14   < > 11 12 9 11 13   CREATININE 0.75   < > 0.66   < > 0.48 0.52 0.34* 0.46 0.43*  CALCIUM 9.0   < > 8.7*   < > 8.5* 8.5* 8.4* 8.7* 8.6*  MG 2.0  --  1.9  --   --   --   --  2.3 2.4  PHOS  --   --  3.2  --   --   --   --  3.1 2.7   < > = values in this interval not displayed.   CBC: Recent Labs  Lab 06/19/20 0450 06/20/20 0506 06/21/20 0636 06/22/20 0627 06/23/20 0439  WBC 15.9* 11.7* 9.4 10.0 12.0*  HGB 11.2* 9.7* 9.4* 9.6* 8.9*  HCT 35.9* 31.1* 30.7* 31.1* 28.8*  MCV 83.1 83.4 82.1 82.9 82.8  PLT 358 325 317 378 361   BNP (last 3 results) Recent Labs    06/15/20 2125  BNP 49.6     Recent Results (from the past 240 hour(s))  Respiratory Panel by RT PCR (Flu A&B, Covid) - Nasopharyngeal Swab     Status: None   Collection Time: 06/15/20  9:25 PM   Specimen: Nasopharyngeal Swab  Result Value Ref Range Status   SARS Coronavirus 2 by RT PCR NEGATIVE NEGATIVE Final    Comment: (NOTE) SARS-CoV-2 target nucleic acids are NOT DETECTED.  The SARS-CoV-2 RNA is generally detectable in upper respiratoy specimens during the acute phase of infection. The lowest concentration of SARS-CoV-2 viral copies this assay can detect is 131 copies/mL. A negative result does not preclude SARS-Cov-2 infection and should not be used as the sole basis for  treatment or other patient management decisions. A negative result may occur with  improper specimen collection/handling, submission of specimen other than nasopharyngeal swab, presence of viral mutation(s) within the areas targeted by this assay, and inadequate number of viral copies (<131 copies/mL). A negative result must be combined with clinical observations, patient history, and epidemiological information. The expected result is Negative.  Fact Sheet for Patients:  PinkCheek.be  Fact Sheet for Healthcare Providers:  GravelBags.it  This test is no t yet approved or cleared by the [place] FDA and  has been authorized for detection and/or diagnosis of SARS-CoV-2 by FDA under an Emergency Use Authorization (EUA). This EUA will remain  in effect (meaning this test can be used) for the duration of the COVID-19 declaration under Section 564(b)(1) of the Act, 21 U.S.C. section 360bbb-3(b)(1), unless the authorization is terminated or revoked sooner.     Influenza A by PCR NEGATIVE NEGATIVE Final   Influenza B by PCR NEGATIVE NEGATIVE Final    Comment: (NOTE) The Xpert Xpress SARS-CoV-2/FLU/RSV assay is intended as an aid in  the diagnosis of influenza from Nasopharyngeal swab specimens and  should not be used as a sole basis for treatment. Nasal washings and  aspirates are unacceptable for Xpert Xpress SARS-CoV-2/FLU/RSV  testing.  Fact Sheet for Patients: PinkCheek.be  Fact Sheet for Healthcare Providers: GravelBags.it  This test is not yet approved or cleared by the [place] FDA and  has been authorized for detection and/or diagnosis of SARS-CoV-2 by  FDA under an Emergency Use Authorization (EUA). This EUA will remain  in effect (meaning this test can be used) for the duration of the  Covid-19 declaration under Section 564(b)(1) of the Act, 21   U.S.C. section 360bbb-3(b)(1), unless the authorization is  terminated or revoked. Performed at [facility], [address] [place], [place] [address]   Blood Culture (routine x 2)     Status: None   Collection Time: 06/15/20 10:09 PM   Specimen: BLOOD  Result Value Ref Range Status   Specimen Description BLOOD LEFT [facility]  Final   Special Requests   Final    BOTTLES DRAWN AEROBIC AND ANAEROBIC Blood Culture results may not be optimal due to an inadequate volume of blood received in culture bottles   Culture   Final  NO GROWTH 5 DAYS Performed at [facility], [address] [place], [place] [address]    Report Status 06/20/2020 FINAL  Final  Blood Culture (routine x 2)     Status: None   Collection Time: 06/15/20 10:11 PM   Specimen: BLOOD  Result Value Ref Range Status   Specimen Description BLOOD RIGHT [facility]  Final   Special Requests   Final    BOTTLES DRAWN AEROBIC AND ANAEROBIC Blood Culture adequate volume   Culture   Final    NO GROWTH 5 DAYS Performed at [facility] [address], [place], [place] [address]    Report Status 06/20/2020 FINAL  Final  Urine culture     Status: None   Collection Time: 06/15/20 11:40 PM   Specimen: Urine, Random  Result Value Ref Range Status   Specimen Description   Final    URINE, RANDOM Performed at [facility], [address], [place], [place] [address]    Special Requests   Final    NONE Performed at [facility], [address] [place], [place] [address]    Culture   Final    NO GROWTH Performed at [facility], [place] [address] [place], [place] [address]    Report Status 06/17/2020 FINAL  Final  Pleural Fluid culture (includes gram stain)     Status: None   Collection Time: 06/15/20 11:40 PM   Specimen: Pleural Fluid  Result Value Ref Range Status   Specimen Description   Final    PLEURAL Performed at [facility], [address], [place], [place]  [address]    Special Requests   Final    NONE Performed at [facility], [place], [place] [address]    Gram Stain NO WBC SEEN NO ORGANISMS SEEN   Final   Culture   Final    NO GROWTH 3 DAYS Performed at [facility], [place] [address] [place], [place] [address]    Report Status 06/19/2020 FINAL  Final  MRSA PCR Screening     Status: None   Collection Time: 06/16/20 11:51 PM   Specimen: Nasopharyngeal  Result Value Ref Range Status   MRSA by PCR NEGATIVE NEGATIVE Final    Comment:        The GeneXpert MRSA Assay (FDA approved for NASAL specimens only), is one component of a comprehensive MRSA colonization surveillance program. It is not intended to diagnose MRSA infection nor to guide or monitor treatment for MRSA infections. Performed at [facility], [address] [place], [place] [address]   Aerobic/Anaerobic Culture (surgical/deep wound)     Status: None (Preliminary result)   Collection Time: 06/20/20  3:20 PM   Specimen: Wound  Result Value Ref Range Status   Specimen Description   Final    WOUND Performed at [facility], [address] [place], [place] [address]    Special Requests   Final    RIGHT BREAST Performed at [facility], [address] [place], [place] [address]    Gram Stain   Final    RARE WBC PRESENT,BOTH PMN AND MONONUCLEAR NO ORGANISMS SEEN    Culture   Final    CULTURE REINCUBATED FOR BETTER GROWTH Performed at [facility], [place] [address], [place], [place] [address]    Report Status PENDING  Incomplete     Studies: DG Chest Port 1 View  Result Date: 06/22/2020 CLINICAL DATA:  Preoperative chest evaluation EXAM: PORTABLE CHEST 1 VIEW COMPARISON:  06/19/2020 FINDINGS: Chest tube remains in place on the right. Somewhat less pleural density on the right. There may be a small amount of air in the pleural space. Persistent diffuse infiltrate in the right lung. Patchy  infiltrate in the mid and lower lung on the left is somewhat improved. IMPRESSION: 1. Chest tube remains in place on the right. Somewhat less pleural fluid. Question small amount of air in the pleural space. 2. Persistent diffuse infiltrate on the right. 3. Improved aeration in the left mid and lower lung. Electronically Signed   By: Nelson Chimes M.D.   On: 06/22/2020 14:43    Scheduled Meds:  budesonide (PULMICORT) nebulizer solution  0.25 mg Nebulization BID   Chlorhexidine Gluconate Cloth  6 each Topical Q0600   docusate sodium  100 mg Oral BID   ipratropium-albuterol  3 mL Nebulization Q4H   melatonin  5 mg Oral QHS   methylPREDNISolone (SOLU-MEDROL) injection  20 mg Intravenous BID   sodium chloride flush  10-40 mL Intracatheter Q12H   Continuous Infusions:  lactated ringers Stopped (06/21/20 0855)   linezolid (ZYVOX) IV 600 mg (06/22/20 0109)    Assessment/Plan:  1. Acute hypoxic respiratory failure.  The patient on heated high flow nasal cannula 50 L and on less amount of oxygen than yesterday.  Currently 55% FiO2.  Continue Solu-Medrol as per critical care specialist.  Patient for VATS procedure today.  Need to watch respiratory status closely after procedure today. 2. Metastatic breast cancer.  Patient has a right breast cancer extending through the skin.  Pleural fluid cytology positive for adenocarcinoma.  Oncology following.  Palliative care following. 3. Large right pleural effusion and hemothorax.  Dr. Genevive Bi to do a VATS procedure today.  Patient currently has a chest tube in. 4. Sepsis, present on admission with acute hypoxic respiratory failure.  Patient also has pneumonia and right breast cellulitis.  Critical care specialist stop Maxipime.  Currently still on Zyvox. 5. Iron deficiency anemia.  Today's hemoglobin 8.9. 6. Tachycardia.  Continue to monitor.     Code Status:     Code Status Orders  (From admission, onward)         Start     Ordered    06/16/20 0010  Full code  Continuous        06/16/20 0010        Code Status History    Date Active Date Inactive Code Status Order ID Comments User Context   06/23/2015 2341 06/30/2015 1944 Full Code [id number]  Lance Coon, MD Inpatient   Advance Care Planning Activity    Advance Directive Documentation     Most Recent Value  Type of Advance Directive Living will, Healthcare Power of Attorney  Pre-existing out of facility DNR order (yellow form or pink MOST form) --  "MOST" Form in Place? --     Family Communication: As per cardiothoracic surgery today.  Spoke with son at the bedside yesterday Disposition Plan: Status is: Inpatient  Dispo: The patient is from: Home              Anticipated d/c is to: Home              Anticipated d/c date is: Still on way too much oxygen in order to anticipate discharge date yet.              Patient currently going to have a VATS procedure today.  Still on  heated high flow nasal cannula 55% FiO2.  Consultants:  Critical care specialist  Oncology  Cardiothoracic surgery  Procedures:  Right-sided chest tube  Antibiotics:  Zyvox  Time spent: 27 minutes, case discussed with nursing staff  Loletha Grayer  [facility]

## 2020-06-23 NOTE — Anesthesia Preprocedure Evaluation (Signed)
Anesthesia Evaluation  Patient identified by MRN, date of birth, ID band Patient awake    Reviewed: Allergy & Precautions, NPO status , Patient's Chart, lab work & pertinent test results  History of Anesthesia Complications Negative for: history of anesthetic complications  Airway Mallampati: III  TM Distance: >3 FB Neck ROM: Full    Dental no notable dental hx. (+) Teeth Intact   Pulmonary shortness of breath and at rest, neg sleep apnea, pneumonia, unresolved, neg COPD, Patient abstained from smoking.Not current smoker,  Large right pleural effusion, metastatic breast cancer On HFNC   + rhonchi  + decreased breath sounds      Cardiovascular Exercise Tolerance: Good METS(-) hypertension(-) CAD and (-) Past MI negative cardio ROS  (-) dysrhythmias  Rhythm:Regular Rate:Tachycardia - Systolic murmurs    Neuro/Psych negative neurological ROS  negative psych ROS   GI/Hepatic neg GERD  ,(+)     (-) substance abuse  ,   Endo/Other  neg diabetes  Renal/GU negative Renal ROS     Musculoskeletal   Abdominal   Peds  Hematology  (+) anemia ,   Anesthesia Other Findings Past Medical History: No date: Anemia No date: Patient denies medical problems  Reproductive/Obstetrics                             Anesthesia Physical Anesthesia Plan  ASA: IV  Anesthesia Plan: General   Post-op Pain Management:    Induction: Intravenous  PONV Risk Score and Plan: 3 and Ondansetron, Dexamethasone, Propofol infusion, TIVA and Treatment may vary due to age or medical condition  Airway Management Planned: Oral ETT  Additional Equipment: None  Intra-op Plan:   Post-operative Plan: Possible Post-op intubation/ventilation  Informed Consent: I have reviewed the patients History and Physical, chart, labs and discussed the procedure including the risks, benefits and alternatives for the proposed  anesthesia with the patient or authorized representative who has indicated his/her understanding and acceptance.     Dental advisory given  Plan Discussed with: CRNA and Surgeon  Anesthesia Plan Comments: (Discussed risks of anesthesia with patient, including PONV, sore throat, lip/dental damage. Rare risks discussed as well, such as cardiorespiratory and neurological sequelae. Discussed fair possibility of  Prolonged intubation. Discussed code status, patient wishes to remain full code.)        Anesthesia Quick Evaluation

## 2020-06-23 NOTE — Transfer of Care (Signed)
Immediate Anesthesia Transfer of Care Note  Patient: Jessica Willis  Procedure(s) Performed: ATTEMPTED VIDEO ASSISTED THORACOSCOPY (VATS) (Right Chest)  Patient Location: PACU  Anesthesia Type:General  Level of Consciousness: sedated  Airway & Oxygen Therapy: Patient Spontanous Breathing and Patient connected to face mask oxygen  Post-op Assessment: Report given to RN and Post -op Vital signs reviewed and stable  Post vital signs: Reviewed and stable  Last Vitals:  Vitals Value Taken Time  BP 141/81 06/23/20 1500  Temp    Pulse 109 06/23/20 1517  Resp 19 06/23/20 1517  SpO2 95 % 06/23/20 1517  Vitals shown include unvalidated device data.  Last Pain:  Vitals:   06/23/20 0800  TempSrc:   PainSc: 0-No pain      Patients Stated Pain Goal: 0 (20/60/15 6153)  Complications: No complications documented.

## 2020-06-23 NOTE — Progress Notes (Signed)
CRITICAL CARE NOTE  Jessica Willis is a 54 y.o. African American female with a past medical history significant for anemia and a right pleural empyema strep pyogenes in 2016. She presented to [facility] ED on 06/15/2020 with complaints of shortness of breath and progressive cough for the last week. She also admits to a large mass on the right breast and skin changes that have been worsening over the last 6 months, though she never sought medical evaluation. Upon presentation, patient was noted to have a large right breast mass with a duplicate mass above the breast which looked necrotic with bullae and purulent discharge.  Workup in the ED revealed the following: Temp 98.2, BP 180/107, HR 144, RR 38, SpO2 94% on room air, sodium 143, potassium 3.3, chloride 101, CO2 25, BUN 22, creatinine 0.61, lactic acid 2.8, glucose 146, and calcium 9.5. Chest CT angiogram showed no evidence of PE. There was one or possibly two separate large masses seen on the right breast with extension into the skin surface and associated skin thickening and right axillary adenopathy. These findings are consistent with right breast cancer. Imaging also shows a large right pleural effusion with mass-effect, left shift of the mediastinal structures, and collapse of the right lung. Thoracentesis was performed in the ED with the insertion of a pigtail catheter, which yielded 500 cc of serosanguineous fluid. Cytology was sent 06/15/20.   She was admitted by the Hospitalist for further workup and treatment of acute hypoxic respiratory failure in the setting of large right pleural effusion (suspected malignant pleural effusion) due to right breast mass and sepsis in the setting of right breast cellulitis. On 06/16/2020, she became tachycardic, tachypneic, with worsening hypoxia. Follow up chest x-ray showed persistent complete opacification of the right hemithorax along with a slight mediastinal shift to the left. General surgery was consulted to  evaluate need for larger chest tube placement. General surgery placed pigtail to suction and drained 3.5L of dark serosanguinous fluid (did not appear like fresh blood). Cardiothoracic surgery was also consulted to ensure no concern for active bleeding or need for possible surgical intervention. Serial chest x-rays showed that the pleural effusion was improving, and output has slowed considerably. Oncology and Palliative Care have also been consulted.    CC  Follow up respiratory failure  SUBJECTIVE Patient remains critically ill Prognosis is guarded   BP 122/77   Pulse (!) 109   Temp 99.3 F (37.4 C) (Axillary)   Resp (!) 22   Ht 5\' 10"  (1.778 m)   Wt 86.2 kg   LMP 06/16/2015 Comment: pt has had tubes tied   SpO2 96%   BMI 27.26 kg/m    I/O last 3 completed shifts: In: 600 [IV Piggyback:600] Out: 1497 [Urine:630; Chest Tube:385] No intake/output data recorded.  SpO2: 96 % O2 Flow Rate (L/min): 50 L/min FiO2 (%): 80 %  Estimated body mass index is 27.26 kg/m as calculated from the following:   Height as of this encounter: 5\' 10"  (1.778 m).   Weight as of this encounter: 86.2 kg.  SIGNIFICANT EVENTS  11/3 - Right pigtail catheter inserted 11/3 - CTA chest: no evidence of PE; one or two large masses in right breast with extension to skin surface, compatible with right breast cancer; large right pleural effusion with mass effect and complete collapse/compression of right lung and left mediastinal shift; single enlarged left axillary lymph node 11/3 - CXR: right pigtail catheter placed with complete opacification of right hemithorax; no visualized pneumothorax 11/4 -  Venous US Left LE: no evidence of DVT 11/4 - CXR: small right pneumothorax with right-sided pleural effusion, pigtail catheter in place; interval decrease in a large right pleural fluid collection (effusion vs hemothorax); severe airspace and interstitial opacities on the right, mild interstitial opacities on  the left 11/5 - CXR: slightly improved, almost complete whiteout of right chest, slightly decreased size of pleural effusion; interval increase in interstitial and alveolar left lung opacities and possible trace left pleural effusion 11/5 - Currently on 8L [place]; hemodynamically stable on pressors, afebrile 11/6 - CXR: persistent subtotal opacification of right hemithorax by effusion and atelectasis, underlying tumor not excluded; persistent infiltrate and probable pleural effusion at inferior left chest 11/6 - Now on BiPAP, continued respiratory failure, pigtail catheter drains slowly and intermittently, has been blocked in the past 11/7 - CXR: near complete opacification of right hemithorax with minimally improved central right lung aeration; stable left retrocardiac consolidation, favor atelectasis; fluffy left parahilar lung opacities, slightly worsened, favor pulmonary edema 11/7 - Using BiPAP at night. Off BiPAP she appears better but is hypoxemic on 15L nasal cannula with SpO2 79-85%; able to eat today and in less distress; large bore chest tube held off due to tenuous respiratory status and significant tumor infiltration into right chest wall. General surgery prefers thoracic surgery evaluation for possible VATS and large bore tube thoracostomy. This would be for palliative purposes only - patient and family aware of this and wish to proceed. Still full code. 11/8 - Patient switched to HF [place] and appears to be much more comfortable; sitting in bed eating breakfast this morning and reports no new complaints; Dr. Genevive Bi was by to discuss surgery 11/9 - Patient is on HF [place] and does not report any new symptoms/concerns. Started bronchodilators and steroids. 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 VATS procedure scheduled for 06/23/20 with Dr. Genevive Bi. 06/22/20- patient on 70% HFNC sinus tach 120s, AAx3, LUE midline +, plan for thoracotomy with pleurodesis tommorow.      REVIEW OF SYSTEMS  ROS limited due to critical illness. General: improved appetite, improved fatigue, unexpected weight change Respiratory: positive for cough and mild shortness of breath  Cardiovascular: negative for palpitations, chest pain, lower extremity swelling Gastrointestinal: positive for gassiness; negative for abdominal pain, nausea/vomiting Musculoskeletal: movement exacerbates pain and coughing Neurological: negative for lightheadedness, syncope, headaches  PHYSICAL EXAMINATION  GENERAL: Frail, alert, calm and comfortable, no acute distress HEAD: Normocephalic, atraumatic.  EYES: Pupils equal, round, reactive to light.  No scleral icterus.  MOUTH: Moist mucosal membrane. NECK: Supple, no JVD PULMONARY: Lungs clear to auscultation bilaterally; deep breaths provoke cough; tachypnea present; chest tube with serosanguinous drainage CARDIOVASCULAR: S1 and S2. Tachycardia present. No murmurs, rubs, or gallops.  GASTROINTESTINAL: Soft, nontender, non-distended.  Positive bowel sounds.   MUSCULOSKELETAL: No swelling, clubbing, or edema.  NEUROLOGIC: No focal deficits present, alert and oriented x 3 PSYCHIATRIC: Normal mood and affect, not confused SKIN: Significant tumor infiltration from right breast with necrotic fungating mass, no active bleeding or purulent discharge  MEDICATIONS: I have reviewed all medications and confirmed regimen as documented   Scheduled Meds: . budesonide (PULMICORT) nebulizer solution  0.25 mg Nebulization BID  . Chlorhexidine Gluconate Cloth  6 each Topical Q0600  . docusate sodium  100 mg Oral BID  . ipratropium-albuterol  3 mL Nebulization Q4H  . melatonin  5 mg Oral QHS  . methylPREDNISolone (SOLU-MEDROL) injection  20 mg Intravenous BID  . sodium chloride flush  10-40 mL Intracatheter Q12H  Continuous Infusions: . lactated ringers Stopped (06/21/20 0855)  . linezolid (ZYVOX) IV 600 mg (06/22/20 2058)   PRN  Meds:.acetaminophen **OR** acetaminophen, LORazepam, metoprolol tartrate, morphine injection, ondansetron **OR** ondansetron (ZOFRAN) IV, polyethylene glycol, simethicone, sodium chloride flush, traMADol, traZODone   CULTURE RESULTS   Recent Results (from the past 240 hour(s))  Respiratory Panel by RT PCR (Flu A&B, Covid) - Nasopharyngeal Swab     Status: None   Collection Time: 06/15/20  9:25 PM   Specimen: Nasopharyngeal Swab  Result Value Ref Range Status   SARS Coronavirus 2 by RT PCR NEGATIVE NEGATIVE Final    Comment: (NOTE) SARS-CoV-2 target nucleic acids are NOT DETECTED.  The SARS-CoV-2 RNA is generally detectable in upper respiratoy specimens during the acute phase of infection. The lowest concentration of SARS-CoV-2 viral copies this assay can detect is 131 copies/mL. A negative result does not preclude SARS-Cov-2 infection and should not be used as the sole basis for treatment or other patient management decisions. A negative result may occur with  improper specimen collection/handling, submission of specimen other than nasopharyngeal swab, presence of viral mutation(s) within the areas targeted by this assay, and inadequate number of viral copies (<131 copies/mL). A negative result must be combined with clinical observations, patient history, and epidemiological information. The expected result is Negative.  Fact Sheet for Patients:  PinkCheek.be  Fact Sheet for Healthcare Providers:  GravelBags.it  This test is no t yet approved or cleared by the [place] FDA and  has been authorized for detection and/or diagnosis of SARS-CoV-2 by FDA under an Emergency Use Authorization (EUA). This EUA will remain  in effect (meaning this test can be used) for the duration of the COVID-19 declaration under Section 564(b)(1) of the Act, 21 U.S.C. section 360bbb-3(b)(1), unless the authorization is terminated or revoked  sooner.     Influenza A by PCR NEGATIVE NEGATIVE Final   Influenza B by PCR NEGATIVE NEGATIVE Final    Comment: (NOTE) The Xpert Xpress SARS-CoV-2/FLU/RSV assay is intended as an aid in  the diagnosis of influenza from Nasopharyngeal swab specimens and  should not be used as a sole basis for treatment. Nasal washings and  aspirates are unacceptable for Xpert Xpress SARS-CoV-2/FLU/RSV  testing.  Fact Sheet for Patients: PinkCheek.be  Fact Sheet for Healthcare Providers: GravelBags.it  This test is not yet approved or cleared by the [place] FDA and  has been authorized for detection and/or diagnosis of SARS-CoV-2 by  FDA under an Emergency Use Authorization (EUA). This EUA will remain  in effect (meaning this test can be used) for the duration of the  Covid-19 declaration under Section 564(b)(1) of the Act, 21  U.S.C. section 360bbb-3(b)(1), unless the authorization is  terminated or revoked. Performed at [facility], [address] [place], [place] [address]   Blood Culture (routine x 2)     Status: None   Collection Time: 06/15/20 10:09 PM   Specimen: BLOOD  Result Value Ref Range Status   Specimen Description BLOOD LEFT AC  Final   Special Requests   Final    BOTTLES DRAWN AEROBIC AND ANAEROBIC Blood Culture results may not be optimal due to an inadequate volume of blood received in culture bottles   Culture   Final    NO GROWTH 5 DAYS Performed at [facility], [address] [place], [place] [address]    Report Status 06/20/2020 FINAL  Final  Blood Culture (routine x 2)     Status: None  Collection Time: 06/15/20 10:11 PM   Specimen: BLOOD  Result Value Ref Range Status   Specimen Description BLOOD RIGHT [facility]  Final   Special Requests   Final    BOTTLES DRAWN AEROBIC AND ANAEROBIC Blood Culture adequate volume   Culture   Final    NO GROWTH 5 DAYS Performed at [facility], [address], [place], [place] [address]    Report Status 06/20/2020 FINAL  Final  Urine culture     Status: None   Collection Time: 06/15/20 11:40 PM   Specimen: Urine, Random  Result Value Ref Range Status   Specimen Description   Final    URINE, RANDOM Performed at [facility], [address], [place], [place] [address]    Special Requests   Final    NONE Performed at [facility], [address] [place], [place] [address]    Culture   Final    NO GROWTH Performed at [facility], [place] [address] [place], [place] [address]    Report Status 06/17/2020 FINAL  Final  Pleural Fluid culture (includes gram stain)     Status: None   Collection Time: 06/15/20 11:40 PM   Specimen: Pleural Fluid  Result Value Ref Range Status   Specimen Description   Final    PLEURAL Performed at [facility], [address], [place], [place] [address]    Special Requests   Final    NONE Performed at [facility], [place], [place] [address]    Gram Stain NO WBC SEEN NO ORGANISMS SEEN   Final   Culture   Final    NO GROWTH 3 DAYS Performed at [facility], [place] [address], [place], [place] [address]    Report Status 06/19/2020 FINAL  Final  MRSA PCR Screening     Status: None   Collection Time: 06/16/20 11:51 PM   Specimen: Nasopharyngeal  Result Value Ref Range Status   MRSA by PCR NEGATIVE NEGATIVE Final    Comment:        The GeneXpert MRSA Assay (FDA approved for NASAL specimens only), is one component of a comprehensive MRSA colonization surveillance program. It is not intended to diagnose MRSA infection nor to guide or monitor treatment for MRSA infections. Performed at [facility], [address] [place], [place] [address]   Aerobic/Anaerobic Culture (surgical/deep wound)     Status: None (Preliminary result)   Collection Time: 06/20/20  3:20 PM   Specimen: Wound  Result Value Ref Range  Status   Specimen Description   Final    WOUND Performed at [facility], [address], [place], [place] [address]    Special Requests   Final    RIGHT BREAST Performed at [facility], [place] [address] [place],  [address]    Gram Stain   Final    RARE WBC PRESENT,BOTH PMN AND MONONUCLEAR NO ORGANISMS SEEN    Culture   Final    CULTURE REINCUBATED FOR BETTER GROWTH Performed at [facility], [place] [address], [place],  [address]    Report Status PENDING  Incomplete     LABS   CBC Latest Ref Rng & Units 06/23/2020 06/22/2020 06/21/2020  WBC 4.0 - 10.5 K/uL 12.0(H) 10.0 9.4  Hemoglobin 12.0 - 15.0 g/dL 8.9(L) 9.6(L) 9.4(L)  Hematocrit 36 - 46 % 28.8(L) 31.1(L) 30.7(L)  Platelets 150 - 400 K/uL 361 378 317  BMP Latest Ref Rng & Units 06/23/2020 06/22/2020 06/21/2020  Glucose 70 - 99 mg/dL 147(H) 170(H) 100(H)  BUN 6 - 20 mg/dL 13 11 9   Creatinine 0.44 - 1.00 mg/dL 0.43(L) 0.46 0.34(L)  Sodium 135 - 145 mmol/L 140 138 138  Potassium 3.5 - 5.1 mmol/L 4.0 3.9 3.7  Chloride 98 - 111 mmol/L 100 97(L) 100  CO2 22 - 32 mmol/L 32 29 30  Calcium 8.9 - 10.3 mg/dL 8.6(L) 8.7(L) 8.4(L)    Intake/Output Summary (Last 24 hours) at 06/23/2020 0758 Last data filed at 06/23/2020 0500 Gross per 24 hour  Intake 600 ml  Output 1015 ml  Net -415 ml    IMAGING   DG Chest Port 1 View  Result Date: 06/22/2020 CLINICAL DATA:  Preoperative chest evaluation EXAM: PORTABLE CHEST 1 VIEW COMPARISON:  06/19/2020 FINDINGS: Chest tube remains in place on the right. Somewhat less pleural density on the right. There may be a small amount of air in the pleural space. Persistent diffuse infiltrate in the right lung. Patchy infiltrate in the mid and lower lung on the left is somewhat improved. IMPRESSION: 1. Chest tube remains in place on the right. Somewhat less pleural fluid. Question small amount of air in the pleural space. 2. Persistent diffuse infiltrate on the  right. 3. Improved aeration in the left mid and lower lung. Electronically Signed   By: Nelson Chimes M.D.   On: 06/22/2020 14:43    Best Practice: Diet: Regular Diet Pain/Anxiety/Delirium protocol (if indicated): Prn Morphine VAP protocol (if indicated): N/A DVT prophylaxis: Lovenox GI prophylaxis: N/A Glucose control: N/A  Mobility: As tolerated  Code Status: full code   ASSESSMENT AND PLAN SYNOPSIS 54 y.o. female presents with acute hypoxic respiratory failure in the setting of large right malignant pleural effusion. Complex right breast mass is present infiltrating into right chest wall with significant overlying cellulitic skin changes. Cytology reporting metastatic adenocarcinoma. Currently on HF [place] and scheduled to undergo VATS procedure with Dr. Genevive Bi on Thursday 06/23/2020.  ACUTE HYPOXIC RESPIRATORY FAILURE in the setting of large right malignant pleural effusion  - Status post thoracentesis and chest tube insertion - Initial concern for hemothorax, fluid from pigtail catheter was serosanguinous (though didn't appear to be fresh blood), drainage has slowed considerably, H&H has remained stable - In need of large bore thoracostomy with or without VATS (likely will need to be done by thoracic surgery under intubation due to significant tumor infiltration into right chest wall) - Heated high flow oxygen by nasal cannula (appears to be working better than BiPAP) - Start bronchodilators and Solumedrol - Intubate if worse -> mechanically ventilate -> then CCM can place large bore chest tube - Scheduled for VATS procedure 06/23/2020 with Dr. Genevive Bi  RIGHT BREAST MASS - Poor prognosis - Cytology positive for malignancy (metastatic adenocarcinoma, favor mammary primary) - General surgery signed off 06/20/2020 - Oncology following - Palliative care following  SEPSIS secondary to right breast cellulitis - Sepsis present on admission with tachycardia, tachypnea,  leukocytosis -completed IV antibiotics   ELECTROLYTES - Follow labs as needed - Replace as needed - Pharmacy consultation and following   DVT/GI PRX ordered and assessed TRANSFUSIONS AS NEEDED MONITOR FSBS I Assessed the need for Labs I Assessed the need for Foley I Assessed the need for Central Venous Line Family Discussion when available I Assessed the need for Mobilization I made an Assessment of medications to be adjusted accordingly Safety Risk assessment completed   CASE DISCUSSED IN MULTIDISCIPLINARY ROUNDS  WITH ICU TEAM  Critical Care Time devoted to patient care services described in this note is  33 minutes.   Overall, patient is critically ill, prognosis is guarded.      Ottie Glazier, M.D.  [facility] [place]

## 2020-06-24 ENCOUNTER — Inpatient Hospital Stay: Payer: Medicaid Other

## 2020-06-24 ENCOUNTER — Encounter: Payer: Self-pay | Admitting: Cardiothoracic Surgery

## 2020-06-24 DIAGNOSIS — J90 Pleural effusion, not elsewhere classified: Secondary | ICD-10-CM

## 2020-06-24 DIAGNOSIS — D638 Anemia in other chronic diseases classified elsewhere: Secondary | ICD-10-CM

## 2020-06-24 DIAGNOSIS — R6520 Severe sepsis without septic shock: Secondary | ICD-10-CM

## 2020-06-24 DIAGNOSIS — C50919 Malignant neoplasm of unspecified site of unspecified female breast: Secondary | ICD-10-CM

## 2020-06-24 DIAGNOSIS — R29898 Other symptoms and signs involving the musculoskeletal system: Secondary | ICD-10-CM

## 2020-06-24 DIAGNOSIS — Z515 Encounter for palliative care: Secondary | ICD-10-CM

## 2020-06-24 DIAGNOSIS — J9601 Acute respiratory failure with hypoxia: Secondary | ICD-10-CM

## 2020-06-24 LAB — IRON AND TIBC
Iron: 26 ug/dL — ABNORMAL LOW (ref 28–170)
Saturation Ratios: 11 % (ref 10.4–31.8)
TIBC: 235 ug/dL — ABNORMAL LOW (ref 250–450)
UIBC: 209 ug/dL

## 2020-06-24 LAB — CBC WITH DIFFERENTIAL/PLATELET
Abs Immature Granulocytes: 0.21 10*3/uL — ABNORMAL HIGH (ref 0.00–0.07)
Basophils Absolute: 0 10*3/uL (ref 0.0–0.1)
Basophils Relative: 0 %
Eosinophils Absolute: 0 10*3/uL (ref 0.0–0.5)
Eosinophils Relative: 0 %
HCT: 29.2 % — ABNORMAL LOW (ref 36.0–46.0)
Hemoglobin: 8.7 g/dL — ABNORMAL LOW (ref 12.0–15.0)
Immature Granulocytes: 2 %
Lymphocytes Relative: 5 %
Lymphs Abs: 0.7 10*3/uL (ref 0.7–4.0)
MCH: 25.1 pg — ABNORMAL LOW (ref 26.0–34.0)
MCHC: 29.8 g/dL — ABNORMAL LOW (ref 30.0–36.0)
MCV: 84.1 fL (ref 80.0–100.0)
Monocytes Absolute: 0.8 10*3/uL (ref 0.1–1.0)
Monocytes Relative: 6 %
Neutro Abs: 11 10*3/uL — ABNORMAL HIGH (ref 1.7–7.7)
Neutrophils Relative %: 87 %
Platelets: 392 10*3/uL (ref 150–400)
RBC: 3.47 MIL/uL — ABNORMAL LOW (ref 3.87–5.11)
RDW: 13 % (ref 11.5–15.5)
WBC: 12.7 10*3/uL — ABNORMAL HIGH (ref 4.0–10.5)
nRBC: 0.2 % (ref 0.0–0.2)

## 2020-06-24 LAB — BASIC METABOLIC PANEL
Anion gap: 9 (ref 5–15)
BUN: 13 mg/dL (ref 6–20)
CO2: 33 mmol/L — ABNORMAL HIGH (ref 22–32)
Calcium: 8.8 mg/dL — ABNORMAL LOW (ref 8.9–10.3)
Chloride: 98 mmol/L (ref 98–111)
Creatinine, Ser: 0.51 mg/dL (ref 0.44–1.00)
GFR, Estimated: >60 mL/min (ref >60)
Glucose, Bld: 135 mg/dL — ABNORMAL HIGH (ref 70–99)
Potassium: 4.1 mmol/L (ref 3.5–5.1)
Sodium: 140 mmol/L (ref 135–145)

## 2020-06-24 LAB — RETIC PANEL
Immature Retic Fract: 28.1 % — ABNORMAL HIGH (ref 2.3–15.9)
RBC.: 3.43 MIL/uL — ABNORMAL LOW (ref 3.87–5.11)
Retic Count, Absolute: 83 10*3/uL (ref 19.0–186.0)
Retic Ct Pct: 2.4 % (ref 0.4–3.1)
Reticulocyte Hemoglobin: 23.9 pg — ABNORMAL LOW (ref >27.9)

## 2020-06-24 LAB — CYTOLOGY - NON PAP

## 2020-06-24 LAB — MAGNESIUM: Magnesium: 2.3 mg/dL (ref 1.7–2.4)

## 2020-06-24 LAB — PHOSPHORUS: Phosphorus: 2.8 mg/dL (ref 2.5–4.6)

## 2020-06-24 LAB — FOLATE: Folate: 4.1 ng/mL — ABNORMAL LOW (ref >5.9)

## 2020-06-24 LAB — VITAMIN B12: Vitamin B-12: 617 pg/mL (ref 180–914)

## 2020-06-24 IMAGING — MR MR HEAD WO/W CM
14 series · 48 of 48 positions shown · IV contrast (gadavist)
Comparison: None.

CLINICAL DATA: History of metastatic breast cancer.  Staging.

EXAM:
MRI HEAD WITHOUT AND WITH CONTRAST
TECHNIQUE: Multiplanar, multiecho pulse sequences of the brain and surrounding
structures were obtained without and with intravenous contrast.
CONTRAST:  8mL GADAVIST GADOBUTROL 1 MMOL/ML IV SOLN

[Series 5: ax dwi_tracew · axial · 3.0mm · 0.60mm/px · z∈[-170,-23]mm · 4 of 48 slices shown]
[im 1/48]
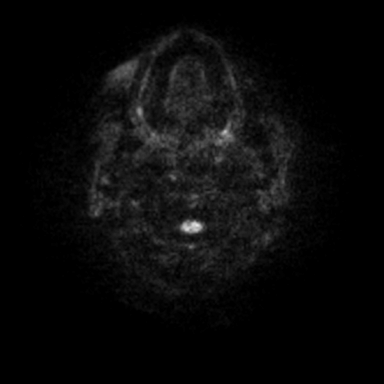
[im 16/48]
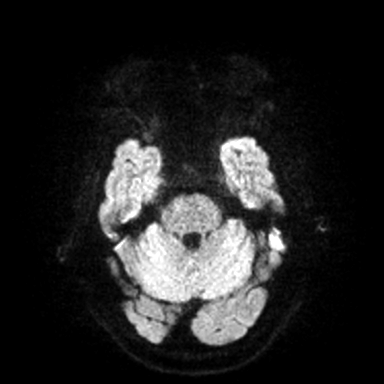
[im 32/48]
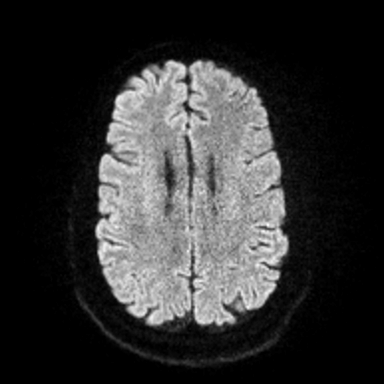
[im 48/48]
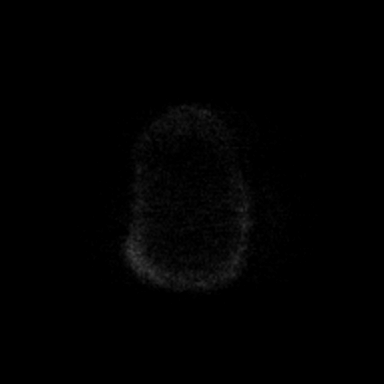

[Series 6: ax dwi_adc · axial · 3.0mm · 0.60mm/px · z∈[-170,-23]mm · 4 of 48 slices shown]
[im 1/48]
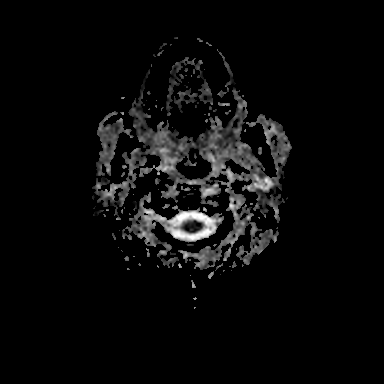
[im 16/48]
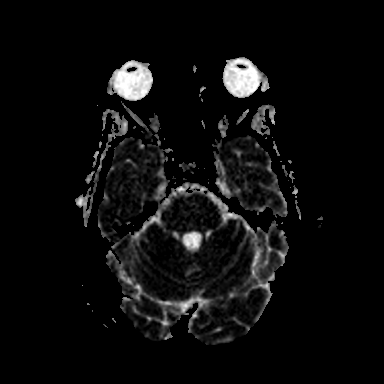
[im 32/48]
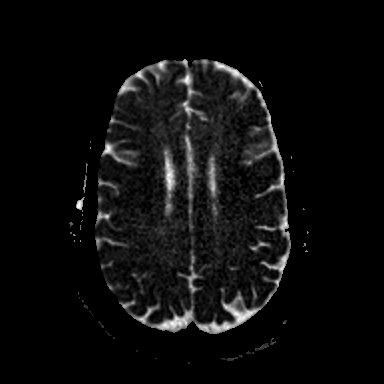
[im 48/48]
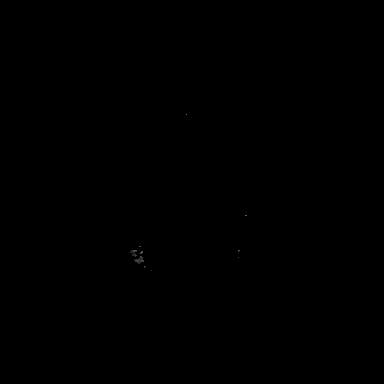

[Series 7: cor dwi_tracew · coronal · 5.0mm · 1.31mm/px · 2 of 38 slices shown]
[im 1/38]
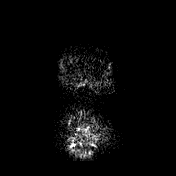
[im 38/38]
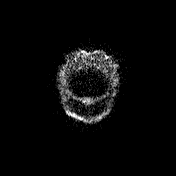

[Series 8: cor dwi_adc · coronal · 5.0mm · 1.31mm/px · 2 of 38 slices shown]
[im 1/38]
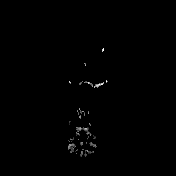
[im 38/38]
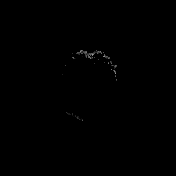

[Series 9: T1 · sagittal · 5.0mm · 0.62mm/px · 1 of 21 slices shown (1 of 2)]
[im 1/21]
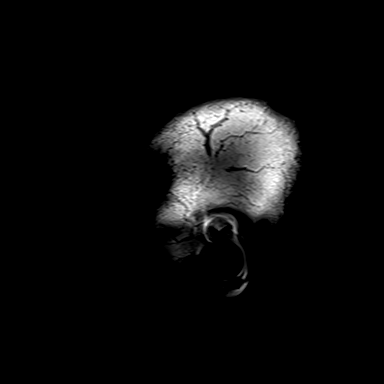

[Series 10: T2 · axial · 5.0mm · 0.53mm/px · 1 of 25 slices shown (1 of 2)]
[im 1/25]
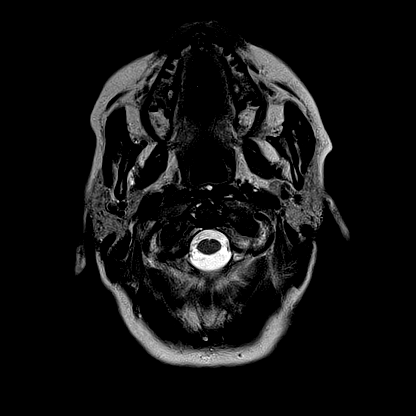

[Series 12: pha_images · axial · 3.0mm · 0.90mm/px · z∈[-174,-18]mm · 3 of 56 slices shown]
[im 1/56]
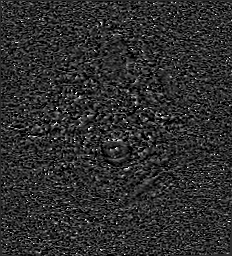
[im 28/56]
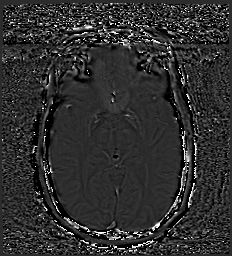
[im 56/56]
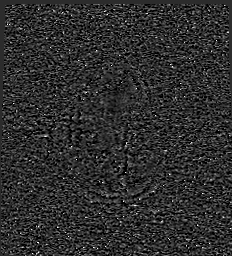

[Series 13: swi_images · axial · 3.0mm · 0.90mm/px · z∈[-174,-18]mm · 3 of 56 slices shown]
[im 1/56]
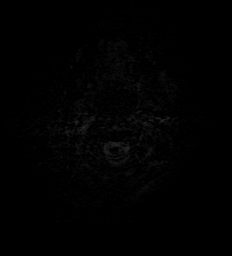
[im 28/56]
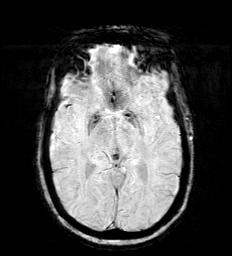
[im 56/56]
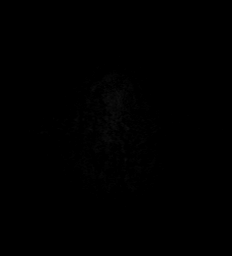

[Series 15: FLAIR · axial · 3.0mm · 0.53mm/px · z∈[-172,-18]mm · 3 of 55 slices shown]
[im 1/55]
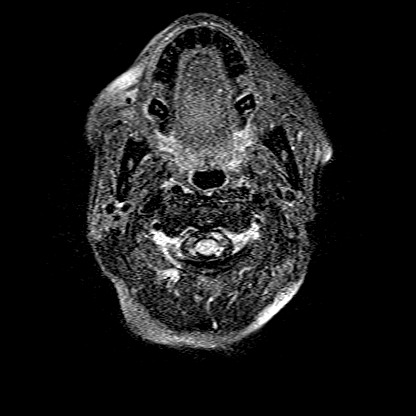
[im 28/55]
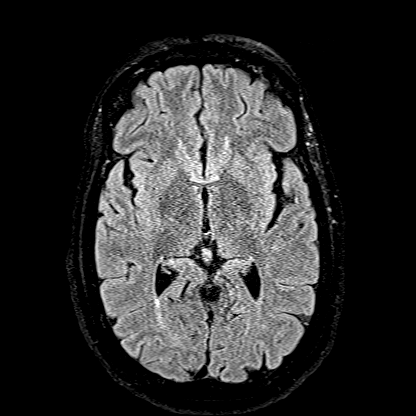
[im 55/55]
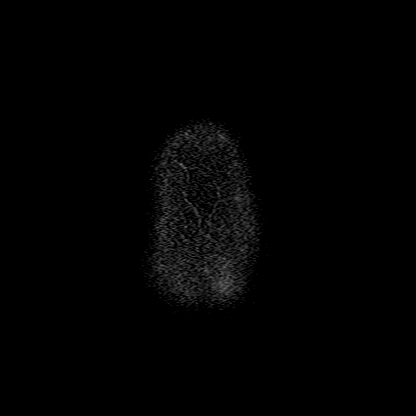

[Series 16: T1 · axial · 1.0mm · 0.98mm/px · z∈[-183,-17]mm · 10 of 176 slices shown (2 of 2)]
[im 1/176]
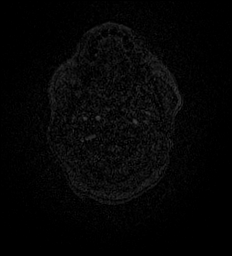
[im 20/176]
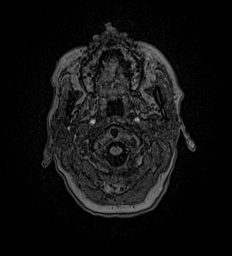
[im 39/176]
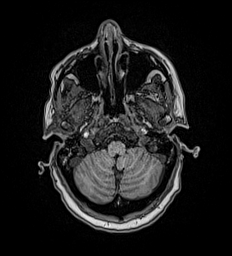
[im 59/176]
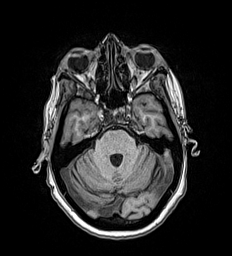
[im 78/176]
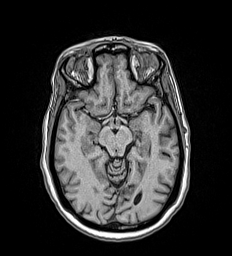
[im 98/176]
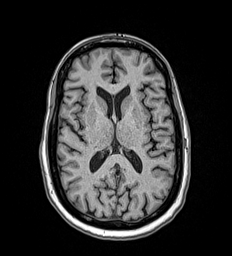
[im 117/176]
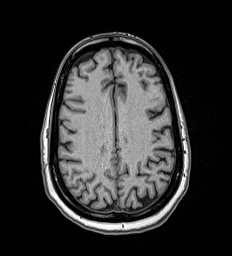
[im 137/176]
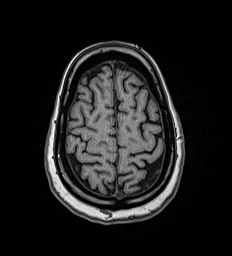
[im 156/176]
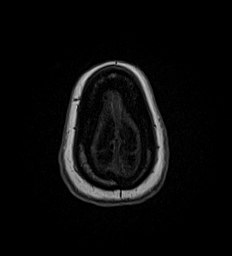
[im 176/176]
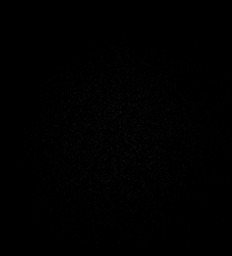

[Series 17: T2 · coronal · 5.0mm · 0.45mm/px · 2 of 31 slices shown (2 of 2)]
[im 1/31]
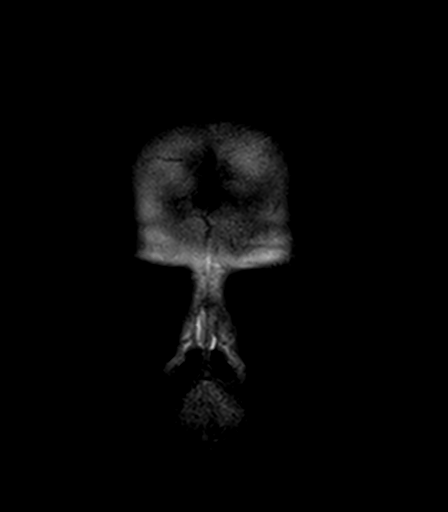
[im 31/31]
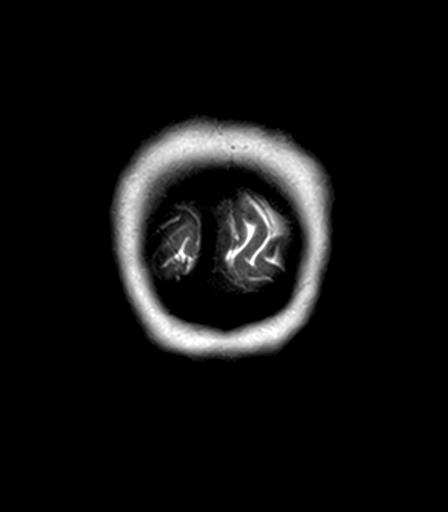

[Series 18: T1 post-contrast · axial · 1.0mm · 0.98mm/px · z∈[-183,-17]mm · 10 of 176 slices shown (1 of 3)]
[im 1/176]
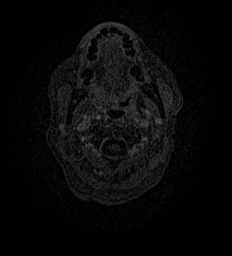
[im 20/176]
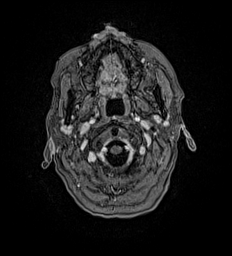
[im 39/176]
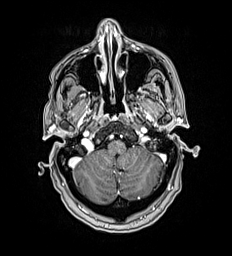
[im 59/176]
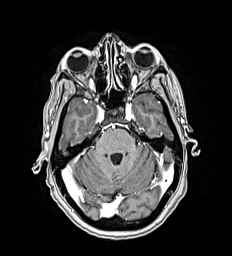
[im 78/176]
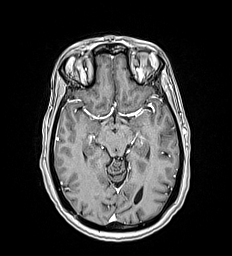
[im 98/176]
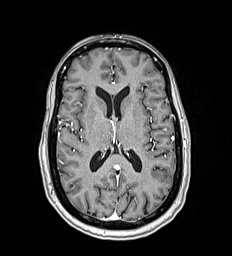
[im 117/176]
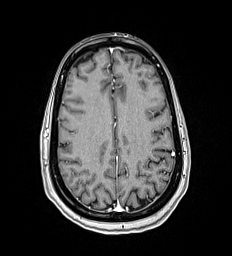
[im 137/176]
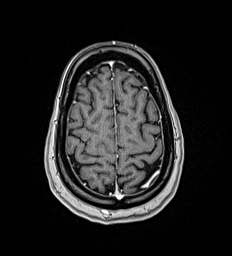
[im 156/176]
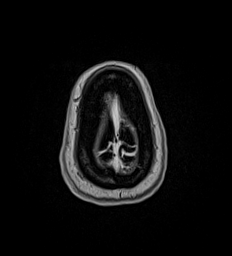
[im 176/176]
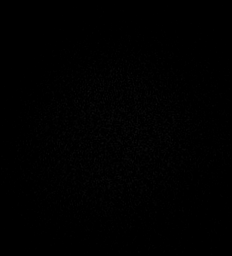

[Series 19: T1 post-contrast · coronal · 5.0mm · 0.90mm/px · 2 of 31 slices shown (2 of 3)]
[im 1/31]
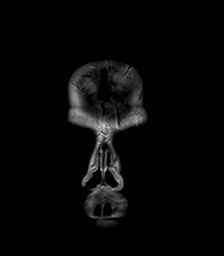
[im 31/31]
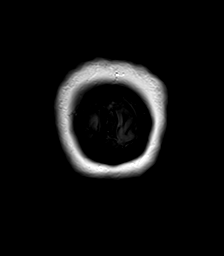

[Series 20: T1 post-contrast · sagittal · 5.0mm · 0.62mm/px · 1 of 21 slices shown (3 of 3)]
[im 1/21]
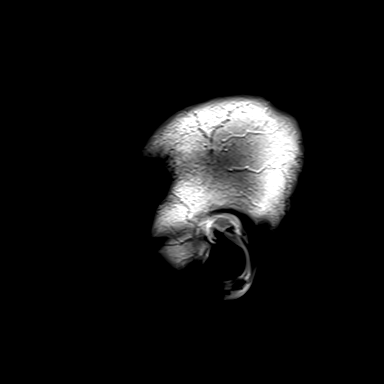

[48 of 48 positions shown; findings below may reference images not displayed]

FINDINGS: Brain: The brain has a normal appearance without evidence of
malformation, atrophy, old or acute small or large vessel
infarction, mass lesion, hemorrhage, hydrocephalus or extra-axial
collection. After contrast administration, no enhancement the brain
or leptomeninges occurs.

Vascular: Major vessels at the base of the brain show flow. Venous
sinuses appear patent.

Skull and upper cervical spine: 1 cm metastasis affecting the
clivus. Scattered metastases in the upper cervical spine.

Sinuses/Orbits: Clear/normal.

Other: None significant.
IMPRESSION: 1. Normal appearance of the brain itself. No evidence of metastatic
disease to the brain or leptomeninges.
2. 1 cm metastasis affecting the clivus. Scattered metastases in the
upper cervical spine.

## 2020-06-24 MED ORDER — IPRATROPIUM-ALBUTEROL 0.5-2.5 (3) MG/3ML IN SOLN
3.0000 mL | Freq: Two times a day (BID) | RESPIRATORY_TRACT | Status: DC
  Administered 2020-06-24 – 2020-07-01 (×13): 3 mL via RESPIRATORY_TRACT
  Filled 2020-06-24 (×14): qty 3

## 2020-06-24 MED ORDER — GADOBUTROL 1 MMOL/ML IV SOLN
8.0000 mL | Freq: Once | INTRAVENOUS | Status: AC | PRN
  Administered 2020-06-24: 8 mL via INTRAVENOUS

## 2020-06-24 NOTE — Progress Notes (Signed)
Hematology/Oncology Progress Note [facility] [phone] [phone]  Patient Care Team: System, Provider Not In as PCP - General   Name of the patient: Jessica Willis  [id number]  12-03-1965  Date of visit: 06/24/20   INTERVAL HISTORY-  Remains in ICU due to acute respiratory failure.  On nasal cannula oxygen today. Yesterday patient had attempted right thoracoscopy  for tac pleurodesis but not successful.  Pleural fluid was drained. She reports breathing is better today.  Remains tachycardic. Regarding family history, patient reports two cousins may have breast surgery in the past.  She does not know details.\   Review of systems- Review of Systems  Unable to perform ROS: Severe respiratory distress  Constitutional: Positive for appetite change, fatigue and unexpected weight change.  Respiratory: Positive for cough and shortness of breath.   Cardiovascular: Negative for leg swelling.  Gastrointestinal: Negative for abdominal pain.  Skin: Negative for rash.  Neurological: Negative for light-headedness.  Psychiatric/Behavioral: Negative for confusion.    No Known Allergies  Patient Active Problem List   Diagnosis Date Noted  . Iron deficiency anemia   . Anemia, chronic disease   . Tachycardia   . Metastatic breast cancer ([place])   . Malignant pleural effusion   . Chest tube in place   . Goals of care, counseling/discussion   . Hemothorax on right 06/16/2020  . Acute respiratory failure with hypoxia ([place])   . Breast mass, right 06/15/2020  . Mastitis 06/15/2020  . Pleural effusion 06/15/2020  . Intestinal obstruction ([place])   . Leukocytosis   . Hypokalemia 06/24/2015  . Sepsis ([place]) 06/23/2015  . CAP (community acquired pneumonia) 06/23/2015  . Partial small bowel obstruction ([place]) 06/23/2015     Past Medical History:  Diagnosis Date  . Anemia   . Patient denies medical problems      Past Surgical History:   Procedure Laterality Date  . TUBAL LIGATION    . VIDEO ASSISTED THORACOSCOPY (VATS)/THOROCOTOMY Right 06/23/2020   Procedure: ATTEMPTED VIDEO ASSISTED THORACOSCOPY (VATS);  Surgeon: Nestor Lewandowsky, MD;  Location: [facility] ORS;  Service: General;  Laterality: Right;    Social History   Socioeconomic History  . Marital status: Single    Spouse name: Not on file  . Number of children: Not on file  . Years of education: Not on file  . Highest education level: Not on file  Occupational History  . Not on file  Tobacco Use  . Smoking status: Never Smoker  . Smokeless tobacco: Never Used  Substance and Sexual Activity  . Alcohol use: Not Currently    Alcohol/week: 1.0 standard drink    Types: 1 Cans of beer per week  . Drug use: No  . Sexual activity: Not on file  Other Topics Concern  . Not on file  Social History Narrative  . Not on file   Social Determinants of Health   Financial Resource Strain:   . Difficulty of Paying Living Expenses: Not on file  Food Insecurity:   . Worried About Charity fundraiser in the Last Year: Not on file  . Ran Out of Food in the Last Year: Not on file  Transportation Needs:   . Lack of Transportation (Medical): Not on file  . Lack of Transportation (Non-Medical): Not on file  Physical Activity:   . Days of Exercise per Week: Not on file  . Minutes of Exercise per Session: Not on file  Stress:   . Feeling of Stress :  Not on file  Social Connections:   . Frequency of Communication with Friends and Family: Not on file  . Frequency of Social Gatherings with Friends and Family: Not on file  . Attends Religious Services: Not on file  . Active Member of Clubs or Organizations: Not on file  . Attends Archivist Meetings: Not on file  . Marital Status: Not on file  Intimate Partner Violence:   . Fear of Current or Ex-Partner: Not on file  . Emotionally Abused: Not on file  . Physically Abused: Not on file  . Sexually Abused: Not on file      Family History  Problem Relation Age of Onset  . Diabetes Other   . Hypertension Other      Current Facility-Administered Medications:  .  acetaminophen (TYLENOL) tablet 650 mg, 650 mg, Oral, Q6H PRN, 650 mg at 06/22/20 1838 **OR** acetaminophen (TYLENOL) suppository 650 mg, 650 mg, Rectal, Q6H PRN, Jonelle Sidle, Mohammad L, MD .  budesonide (PULMICORT) nebulizer solution 0.25 mg, 0.25 mg, Nebulization, BID, Kasa, Kurian, MD, 0.25 mg at 06/24/20 0810 .  Chlorhexidine Gluconate Cloth 2 % PADS 6 each, 6 each, Topical, Q0600, Bradly Bienenstock, NP, 6 each at 06/23/20 0518 .  docusate sodium (COLACE) capsule 100 mg, 100 mg, Oral, BID, Rust-Chester, Britton L, NP, 100 mg at 06/24/20 0908 .  ipratropium-albuterol (DUONEB) 0.5-2.5 (3) MG/3ML nebulizer solution 3 mL, 3 mL, Nebulization, BID, Lanney Gins, Fuad, MD .  lactated ringers infusion, , Intravenous, Continuous, Brand Males, MD, Stopped at 06/23/20 1230 .  LORazepam (ATIVAN) injection 1 mg, 1 mg, Intravenous, Q6H PRN, Darel Hong D, NP, 1 mg at 06/20/20 2131 .  MEDLINE mouth rinse, 15 mL, Mouth Rinse, BID, Blakeney, Dreama Saa, NP .  melatonin tablet 5 mg, 5 mg, Oral, QHS, Sharion Settler, NP, 5 mg at 06/23/20 2128 .  methylPREDNISolone sodium succinate (SOLU-MEDROL) 40 mg/mL injection 20 mg, 20 mg, Intravenous, BID, Mortimer Fries, Kurian, MD, 20 mg at 06/24/20 0910 .  metoprolol tartrate (LOPRESSOR) injection 5 mg, 5 mg, Intravenous, Q6H PRN, Sharion Settler, NP, 5 mg at 06/21/20 2049 .  morphine 2 MG/ML injection 1-2 mg, 1-2 mg, Intravenous, Q4H PRN, Darel Hong D, NP, 2 mg at 06/24/20 0910 .  ondansetron (ZOFRAN) tablet 4 mg, 4 mg, Oral, Q6H PRN **OR** ondansetron (ZOFRAN) injection 4 mg, 4 mg, Intravenous, Q6H PRN, Gala Romney L, MD, 4 mg at 06/21/20 1116 .  polyethylene glycol (MIRALAX / GLYCOLAX) packet 17 g, 17 g, Oral, Daily PRN, Rust-Chester, Britton L, NP, 17 g at 06/22/20 1125 .  simethicone (MYLICON) chewable tablet 80 mg, 80  mg, Oral, QID PRN, Dessa Phi, DO, 80 mg at 06/22/20 0538 .  sodium chloride flush (NS) 0.9 % injection 10-40 mL, 10-40 mL, Intracatheter, Q12H, Choi, Jennifer, DO, 10 mL at 06/24/20 1146 .  sodium chloride flush (NS) 0.9 % injection 10-40 mL, 10-40 mL, Intracatheter, PRN, Dessa Phi, DO .  traMADol Veatrice Bourbon) tablet 50 mg, 50 mg, Oral, Q6H PRN, Dessa Phi, DO, 50 mg at 06/19/20 1401 .  traZODone (DESYREL) tablet 50 mg, 50 mg, Oral, QHS PRN, Darel Hong D, NP, 50 mg at 06/22/20 2059   Physical exam:  Vitals:   06/24/20 0812 06/24/20 0826 06/24/20 0900 06/24/20 1000  BP:   (!) 147/87 140/78  Pulse:   (!) 118 (!) 117  Resp:   (!) 24 (!) 22  Temp:   98.9 F (37.2 C)   TempSrc:   Axillary   SpO2: 92%  97% 100% 99%  Weight:      Height:       Physical Exam Constitutional:      General: She is not in acute distress.    Appearance: She is ill-appearing. She is not diaphoretic.  HENT:     Head: Normocephalic and atraumatic.     Nose: Nose normal.     Mouth/Throat:     Pharynx: No oropharyngeal exudate.  Eyes:     General: No scleral icterus.    Pupils: Pupils are equal, round, and reactive to light.  Cardiovascular:     Rate and Rhythm: Regular rhythm. Tachycardia present.     Heart sounds: No murmur heard.   Pulmonary:     Effort: No respiratory distress.     Breath sounds: No rales.     Comments: Decreased breath sound on the right. She breathes comfortably via nasal cannula oxygen Chest:     Chest wall: No tenderness.  Abdominal:     General: There is no distension.     Palpations: Abdomen is soft.     Tenderness: There is no abdominal tenderness.  Musculoskeletal:        General: Normal range of motion.     Cervical back: Normal range of motion and neck supple.  Skin:    General: Skin is warm and dry.     Findings: No erythema.  Neurological:     Mental Status: She is alert and oriented to person, place, and time.     Cranial Nerves: No cranial nerve  deficit.     Motor: No abnormal muscle tone.     Coordination: Coordination normal.  Psychiatric:        Mood and Affect: Affect normal.    right breast mass involving skin      CMP Latest Ref Rng & Units 06/24/2020  Glucose 70 - 99 mg/dL 135(H)  BUN 6 - 20 mg/dL 13  Creatinine 0.44 - 1.00 mg/dL 0.51  Sodium 135 - 145 mmol/L 140  Potassium 3.5 - 5.1 mmol/L 4.1  Chloride 98 - 111 mmol/L 98  CO2 22 - 32 mmol/L 33(H)  Calcium 8.9 - 10.3 mg/dL 8.8(L)  Total Protein 6.5 - 8.1 g/dL -  Total Bilirubin 0.3 - 1.2 mg/dL -  Alkaline Phos 38 - 126 U/L -  AST 15 - 41 U/L -  ALT 0 - 44 U/L -   CBC Latest Ref Rng & Units 06/24/2020  WBC 4.0 - 10.5 K/uL 12.7(H)  Hemoglobin 12.0 - 15.0 g/dL 8.7(L)  Hematocrit 36 - 46 % 29.2(L)  Platelets 150 - 400 K/uL 392    RADIOGRAPHIC STUDIES: I have personally reviewed the radiological images as listed and agreed with the findings in the report. DG Chest 1 View  Result Date: 06/16/2020 CLINICAL DATA:  Hemothorax on the right. EXAM: CHEST  1 VIEW COMPARISON:  Same day chest radiograph. FINDINGS: A right-sided pleural pigtail catheter is redemonstrated. There is interval improvement in aeration of the right lung with decreased size of a right hemothorax. A small right pneumothorax is not significantly changed in size. Severe right atelectasis/airspace disease is redemonstrated. Mild interstitial opacities in the left lung appear unchanged. There is no left pleural effusion or left pneumothorax. The cardiac silhouette is partially obscured but appears unchanged. IMPRESSION: 1. Interval improvement in aeration of the right lung with decreased size of a right hemothorax. 2. Unchanged small right pneumothorax with pleural pigtail catheter in place. 3. Severe right atelectasis/airspace disease. Electronically Signed  By: Zerita Boers M.D.   On: 06/16/2020 14:52   DG Chest 1 View  Result Date: 06/16/2020 CLINICAL DATA:  Cough and phlegm production. EXAM:  CHEST  1 VIEW COMPARISON:  Chest radiograph dated 06/15/2020. FINDINGS: The cardiac silhouette is obscured. A right-sided pleural pigtail catheter is in unchanged location and there has been interval decrease in a large right pleural fluid collection (effusion versus hemothorax). There is no longer mediastinal shift to the left. There is a small right pneumothorax. Severe airspace and interstitial opacities are seen on the right. Mild interstitial opacities are seen on the left. There is no left pleural effusion or pneumothorax. The osseous structures are intact. IMPRESSION: 1. Small right pneumothorax with a right-sided pleural pigtail catheter in place. 2. Interval decrease in a large right pleural fluid collection (effusion versus hemothorax). 3. Severe airspace and interstitial opacities on the right. Mild interstitial opacities in the left. Electronically Signed   By: Zerita Boers M.D.   On: 06/16/2020 14:28   DG Chest 1 View  Result Date: 06/15/2020 CLINICAL DATA:  Short of breath, cough EXAM: CHEST  1 VIEW COMPARISON:  06/29/2015 FINDINGS: 2 frontal views of the chest demonstrate complete opacification of the right hemithorax, with slight mediastinal shift to the left, consistent with large right pleural effusion and underlying lung consolidation. Chronic areas of scarring are seen within the left chest. No pneumothorax. Cardiac silhouette is unremarkable. No acute bony abnormalities. IMPRESSION: 1. Opacification of the right hemithorax consistent with large right pleural effusion and underlying right lung consolidation. Electronically Signed   By: Randa Ngo M.D.   On: 06/15/2020 21:42   CT Angio Chest PE W and/or Wo Contrast  Result Date: 06/15/2020 CLINICAL DATA:  Cough, shortness of breath EXAM: CT ANGIOGRAPHY CHEST WITH CONTRAST TECHNIQUE: Multidetector CT imaging of the chest was performed using the standard protocol during bolus administration of intravenous contrast. Multiplanar CT image  reconstructions and MIPs were obtained to evaluate the vascular anatomy. CONTRAST:  100 mL Omnipaque 350 IV COMPARISON:  06/30/2015 FINDINGS: Cardiovascular: No filling defects in the pulmonary arteries to suggest pulmonary emboli. Heart is normal size. Aorta normal caliber. Mediastinum/Nodes: No visible mediastinal or hilar adenopathy. Bilateral axillary adenopathy, right greater than left. Index right axillary lymph node has a short axis diameter of 13 mm. Left axillary lymph node has a short axis diameter of 11 mm. Lungs/Pleura: Large right pleural effusion completely compressed seen in the right lung which is not aerated. This appears to have mass effect with shift of the right lung and mediastinal structures to the left. Areas of atelectasis or scarring in the left lung base. No effusion on the left. Upper Abdomen: Imaging into the upper abdomen demonstrates no acute findings. Musculoskeletal: Large mass noted in the right breast measuring 8 x 6 cm. Possible separate right breast mass more inferiorly in the right breast measuring 8 x 4 cm. Skin thickening or edema noted. Associated right axillary adenopathy. A few ill-defined sclerotic areas within the thoracic spine could reflect metastases. Review of the MIP images confirms the above findings. IMPRESSION: No evidence of pulmonary embolus. One, possibly 2 separate large masses within the right breast extending to the skin surface with associated adjacent skin thickening and right axillary adenopathy. Findings compatible with right breast cancer. Large right pleural effusion which appears to have mass effect with complete collapse/compression of the right lung and shift of mediastinal structures to the left. Single enlarged left axillary lymph node. These results were called by telephone  at the time of interpretation on 06/15/2020 at 10:43 pm to provider [facility] , who verbally acknowledged these results. Electronically Signed   By: Rolm Baptise M.D.   On:  06/15/2020 22:46   MR BRAIN W WO CONTRAST  Result Date: 06/24/2020 CLINICAL DATA:  History of metastatic breast cancer.  Staging. EXAM: MRI HEAD WITHOUT AND WITH CONTRAST TECHNIQUE: Multiplanar, multiecho pulse sequences of the brain and surrounding structures were obtained without and with intravenous contrast. CONTRAST:  22m GADAVIST GADOBUTROL 1 MMOL/ML IV SOLN COMPARISON:  None. FINDINGS: Brain: The brain has a normal appearance without evidence of malformation, atrophy, old or acute small or large vessel infarction, mass lesion, hemorrhage, hydrocephalus or extra-axial collection. After contrast administration, no enhancement the brain or leptomeninges occurs. Vascular: Major vessels at the base of the brain show flow. Venous sinuses appear patent. Skull and upper cervical spine: 1 cm metastasis affecting the clivus. Scattered metastases in the upper cervical spine. Sinuses/Orbits: Clear/normal. Other: None significant. IMPRESSION: 1. Normal appearance of the brain itself. No evidence of metastatic disease to the brain or leptomeninges. 2. 1 cm metastasis affecting the clivus. Scattered metastases in the upper cervical spine. Electronically Signed   By: MNelson ChimesM.D.   On: 06/24/2020 11:53   UKoreaVenous Img Lower Unilateral Left (DVT)  Result Date: 06/16/2020 CLINICAL DATA:  Initial evaluation for acute left lower extremity edema. EXAM: LEFT LOWER EXTREMITY VENOUS DOPPLER ULTRASOUND TECHNIQUE: Gray-scale sonography with graded compression, as well as color Doppler and duplex ultrasound were performed to evaluate the lower extremity deep venous systems from the level of the common femoral vein and including the common femoral, femoral, profunda femoral, popliteal and calf veins including the posterior tibial, peroneal and gastrocnemius veins when visible. The superficial great saphenous vein was also interrogated. Spectral Doppler was utilized to evaluate flow at rest and with distal augmentation  maneuvers in the common femoral, femoral and popliteal veins. COMPARISON:  None. FINDINGS: Contralateral Common Femoral Vein: Respiratory phasicity is normal and symmetric with the symptomatic side. No evidence of thrombus. Normal compressibility. Common Femoral Vein: No evidence of thrombus. Normal compressibility, respiratory phasicity and response to augmentation. Saphenofemoral Junction: No evidence of thrombus. Normal compressibility and flow on color Doppler imaging. Profunda Femoral Vein: No evidence of thrombus. Normal compressibility and flow on color Doppler imaging. Femoral Vein: No evidence of thrombus. Normal compressibility, respiratory phasicity and response to augmentation. Popliteal Vein: No evidence of thrombus. Normal compressibility, respiratory phasicity and response to augmentation. Calf Veins: No evidence of thrombus. Normal compressibility and flow on color Doppler imaging. Superficial Great Saphenous Vein: No evidence of thrombus. Normal compressibility. Venous Reflux:  None. Other Findings:  None. IMPRESSION: No evidence of deep venous thrombosis. Electronically Signed   By: BJeannine BogaM.D.   On: 06/16/2020 01:30   DG Chest Port 1 View  Result Date: 06/23/2020 CLINICAL DATA:  Status post video-assisted thoracoscopy. EXAM: PORTABLE CHEST 1 VIEW COMPARISON:  June 22, 2020. FINDINGS: Stable cardiomegaly central pulmonary vascular congestion is noted. Right-sided chest tube has been removed. No pneumothorax is noted. Stable diffuse right lung opacity is noted concerning for pneumonia with associated pleural effusion, including loculated fluid and right lung apex. Small loculated left pleural effusion is noted laterally. Left perihilar and basilar opacity is noted concerning for infiltrate or possibly edema. Bony thorax is unremarkable. IMPRESSION: Stable diffuse right lung opacity is noted concerning for pneumonia with associated pleural effusion, including loculated fluid  and right lung apex. Small loculated left pleural  effusion is noted laterally. Left perihilar and basilar opacity is noted concerning for infiltrate or possibly edema. Electronically Signed   By: Marijo Conception M.D.   On: 06/23/2020 14:59   DG Chest Port 1 View  Result Date: 06/22/2020 CLINICAL DATA:  Preoperative chest evaluation EXAM: PORTABLE CHEST 1 VIEW COMPARISON:  06/19/2020 FINDINGS: Chest tube remains in place on the right. Somewhat less pleural density on the right. There may be a small amount of air in the pleural space. Persistent diffuse infiltrate in the right lung. Patchy infiltrate in the mid and lower lung on the left is somewhat improved. IMPRESSION: 1. Chest tube remains in place on the right. Somewhat less pleural fluid. Question small amount of air in the pleural space. 2. Persistent diffuse infiltrate on the right. 3. Improved aeration in the left mid and lower lung. Electronically Signed   By: Nelson Chimes M.D.   On: 06/22/2020 14:43   DG Chest Port 1 View  Result Date: 06/19/2020 CLINICAL DATA:  Chest tube in place EXAM: PORTABLE CHEST 1 VIEW COMPARISON:  Chest radiograph from one day prior. FINDINGS: Right basilar pigtail chest tube is stable. Stable cardiomediastinal silhouette with mild cardiomegaly. No pneumothorax. Near complete opacification of the right hemithorax with minimally improved central right lung aeration. Extensive fluffy left parahilar lung opacities, slightly worsened. Left retrocardiac consolidation is stable. IMPRESSION: 1. Stable right basilar chest tube without pneumothorax. 2. Near complete opacification of the right hemithorax with minimally improved central right lung aeration, which could be due to any combination of pleural effusion, atelectasis, pneumonia, edema or mass. 3. Stable left retrocardiac consolidation, favor atelectasis. 4. Fluffy left parahilar lung opacities, slightly worsened, favor pulmonary edema. Electronically Signed   By: Ilona Sorrel  M.D.   On: 06/19/2020 15:04   DG CHEST PORT 1 VIEW  Result Date: 06/18/2020 CLINICAL DATA:  Malignant pleural effusion, RIGHT-side chest tube EXAM: PORTABLE CHEST 1 VIEW COMPARISON:  Portable exam 1042 hours compared to 11 x 21 FINDINGS: Pigtail RIGHT thoracostomy tube again seen. Persistent subtotal opacification of the RIGHT hemithorax by effusion and atelectasis, underlying tumor not excluded. Atelectasis versus consolidation of LEFT lower lobe with probable small pleural effusion. Stable heart size. No acute osseous findings. IMPRESSION: Persistent subtotal opacification of the RIGHT hemithorax by effusion and atelectasis, underlying tumor not excluded. Persistent infiltrate and probable pleural effusion at inferior LEFT chest. Electronically Signed   By: Lavonia Dana M.D.   On: 06/18/2020 16:09   DG Chest Port 1 View  Result Date: 06/17/2020 CLINICAL DATA:  Chest x-ray 06/17/2020 5 a.m. EXAM: PORTABLE CHEST 1 VIEW COMPARISON:  Chest x-ray 06/17/2020 4:10 a.m. FINDINGS: Interval increase in size of a likely large right pleural effusion with slight aeration of the right lung. Likely trace pleural effusion on the left. Interval increase in airspace interstitial opacities of the left lung that is most prominent in the left mid lung zone. Right pigtail chest tube overlies the right hemithorax. IMPRESSION: 1. Slightly improved almost complete whiteout of the right chest with slightly decreased in size large pleural effusion. 2. Interval increase in interstitial and alveolar left lung opacities with possible trace left pleural effusion. Electronically Signed   By: Iven Finn M.D.   On: 06/17/2020 21:30   DG Chest Port 1 View  Result Date: 06/17/2020 CLINICAL DATA:  Pleural effusion EXAM: PORTABLE CHEST 1 VIEW COMPARISON:  Yesterday FINDINGS: More dense and homogeneous right sided opacity. Right pleural catheter in stable position. Left lower lobe opacity which  also appears increased. Likely no  cardiomegaly when allowing for mediastinal shift. IMPRESSION: White out of the right chest with mediastinal mass effect from large pleural effusion. Retrocardiac infiltrate that could be infection or atelectasis. Electronically Signed   By: Monte Fantasia M.D.   On: 06/17/2020 05:00   DG Chest Portable 1 View  Result Date: 06/15/2020 CLINICAL DATA:  Right chest tube placement. EXAM: PORTABLE CHEST 1 VIEW COMPARISON:  Radiograph and CT earlier today. FINDINGS: Placement of right pigtail catheter. There is persistent complete opacification of the right hemithorax. There may be slight diminished mediastinal shift to the left. No visualized pneumothorax. No focal abnormality in the left lung. IMPRESSION: Placement of right pigtail catheter with persistent complete opacification of the right hemithorax. There may be slight diminished mediastinal shift to the left. No visualized pneumothorax. Electronically Signed   By: Keith Rake M.D.   On: 06/15/2020 23:40    Assessment and plan-    #Acute hypoxic respiratory failure, secondary to right pleural effusion and complete collapse of right lung. Status post thoracentesis and chest tube insertion.  Attempted VATS procedure on 06/23/2020 with Dr. Genevive Bi.  #Metastatic breast cancer, stage IV. ER/PR negative.  HER-2 status is pending. Discussed with patient that she has hormone receptor negative breast cancer and will most likely need chemotherapy +/- HER-2 therapy pending on HER-2 status.  Consider medi-port placement when she is stable from respiratory aspect. MRI brain was reviewed and discussed with patient. #Anemia, hemoglobin 8.7, stable.  Ferritin level 198, this may be falsely elevated.  I will check reticulocyte panel, B12, folate Continue DVT prophylaxis. Thank you for allowing me to participate in the care of this patient.   Earlie Server, MD, PhD Hematology Oncology [facility] Pager- [phone] 06/24/2020

## 2020-06-24 NOTE — Progress Notes (Signed)
[facility] Day(s): 9.   Post op day(s): 1 Day Post-Op.   Interval History:  Patient seen and examined no acute events or new complaints overnight.  Patient reports she is doing okay She actually reports her breathing is better today and no longer needing HFNC Chest soreness is well controlled No fever, chills, nausea, emesis Stable, mild, leukocytosis to 12.7K Renal function remains normal, sCr - 0.51, UO - 625 ccs + unmeasured No electrolyte derangements   Vital signs in last 24 hours: [min-max] current  Temp:  [97.7 F (36.5 C)-98.9 F (37.2 C)] 98.9 F (37.2 C) (11/12 0900) Pulse Rate:  [100-150] 118 (11/12 0900) Resp:  [16-31] 24 (11/12 0900) BP: (117-147)/(68-96) 147/87 (11/12 0900) SpO2:  [83 %-100 %] 100 % (11/12 0900) FiO2 (%):  [35 %-50 %] 35 % (11/12 0812)     Height: 5\' 10"  (177.8 cm) Weight: 86.2 kg BMI (Calculated): 27.26   Intake/Output last 2 shifts:  11/11 0701 - 11/12 0700 In: 1312 [P.O.:240; I.V.:22; IV Piggyback:1050] Out: 625 [Urine:625]   Physical Exam:  Constitutional: alert, cooperative and no distress  Respiratory: breathing non-labored at rest, on [place] Cardiovascular: regular rate and sinus rhythm  Integumentary: Right breast mass noted. Incision o the right chest wall is CDI with dermabond.  Labs:  CBC Latest Ref Rng & Units 06/24/2020 06/23/2020 06/22/2020  WBC 4.0 - 10.5 K/uL 12.7(H) 12.0(H) 10.0  Hemoglobin 12.0 - 15.0 g/dL 8.7(L) 8.9(L) 9.6(L)  Hematocrit 36 - 46 % 29.2(L) 28.8(L) 31.1(L)  Platelets 150 - 400 K/uL 392 361 378   CMP Latest Ref Rng & Units 06/24/2020 06/23/2020 06/22/2020  Glucose 70 - 99 mg/dL 135(H) 147(H) 170(H)  BUN 6 - 20 mg/dL 13 13 11   Creatinine 0.44 - 1.00 mg/dL 0.51 0.43(L) 0.46  Sodium 135 - 145 mmol/L 140 140 138  Potassium 3.5 - 5.1 mmol/L 4.1 4.0 3.9  Chloride 98 - 111 mmol/L 98 100 97(L)  CO2 22 - 32 mmol/L 33(H) 32 29  Calcium 8.9 - 10.3 mg/dL 8.8(L)  8.6(L) 8.7(L)  Total Protein 6.5 - 8.1 g/dL - - -  Total Bilirubin 0.3 - 1.2 mg/dL - - -  Alkaline Phos 38 - 126 U/L - - -  AST 15 - 41 U/L - - -  ALT 0 - 44 U/L - - -     Imaging studies: No new pertinent imaging studies   Assessment/Plan:  54 y.o. female 1 Day Post-Op s/p attempted thoracoscopy which could not be completed secondary to narrowing of her intercostal spaces that would not accomodate instrumentation   - Continue to monitor SOB, CP, O2 Saturation. Should her pleural effusion recur, likely will need to proceed wit IR placement of percutaneous tube. Unfortunately, I do not think she is amenable to any surgical intervention given intra-operative findings  - Continue to wean from O2 support as tolerated  - Pain control prn   - Pulmonary toilet   - Mobilization as tolerates  - Follow up oncology recommendations  - Okay to transfer to floor from surgical perspective   All of the above findings and recommendations were discussed with the patient, and the medical team, and all of patient's questions were answered to her expressed satisfaction.  -- Edison Simon, PA-C [facility] 06/24/2020, 10:16 AM [phone] M-F: 7am - 4pm

## 2020-06-24 NOTE — Progress Notes (Signed)
[place]  Telephone:([phone] [phone]   Name: Sula Fetterly Date: 06/24/2020 MRN: [id number]  DOB: 11/05/1965  Patient Care Team: System, Provider Not In as PCP - General    REASON FOR CONSULTATION: Grisel Blumenstock is a 54 y.o. female with multiple medical problems including history of anemia who was admitted to the hospital on 06/15/2020 with sepsis and acute hypoxic respiratory failure.  She was found to have a fungating right breast mass with purulent drainage concerning for source of infection.  CTA of the chest revealed near complete collapse of the right lung with extensive right-sided pleural effusion and mass-effect.  Patient underwent chest tube insertion with several liters of serosanguineous fluid drained from her pleural space.  Palliative care was consulted help address goals.    CODE STATUS: Full code  PAST MEDICAL HISTORY: Past Medical History:  Diagnosis Date  . Anemia   . Patient denies medical problems     PAST SURGICAL HISTORY:  Past Surgical History:  Procedure Laterality Date  . TUBAL LIGATION    . VIDEO ASSISTED THORACOSCOPY (VATS)/THOROCOTOMY Right 06/23/2020   Procedure: ATTEMPTED VIDEO ASSISTED THORACOSCOPY (VATS);  Surgeon: Nestor Lewandowsky, MD;  Location: [facility] ORS;  Service: General;  Laterality: Right;    HEMATOLOGY/ONCOLOGY HISTORY:  Oncology History   No history exists.    ALLERGIES:  has No Known Allergies.  MEDICATIONS:  Current Facility-Administered Medications  Medication Dose Route Frequency Provider Last Rate Last Admin  . acetaminophen (TYLENOL) tablet 650 mg  650 mg Oral Q6H PRN Elwyn Reach, MD   650 mg at 06/22/20 2229   Or  . acetaminophen (TYLENOL) suppository 650 mg  650 mg Rectal Q6H PRN Gala Romney L, MD      . budesonide (PULMICORT) nebulizer solution 0.25 mg  0.25 mg Nebulization BID Flora Lipps, MD   0.25 mg at 06/24/20 0810  . Chlorhexidine  Gluconate Cloth 2 % PADS 6 each  6 each Topical Q0600 Bradly Bienenstock, NP   6 each at 06/23/20 0518  . docusate sodium (COLACE) capsule 100 mg  100 mg Oral BID Rust-Chester, Britton L, NP   100 mg at 06/24/20 0908  . ipratropium-albuterol (DUONEB) 0.5-2.5 (3) MG/3ML nebulizer solution 3 mL  3 mL Nebulization BID Ottie Glazier, MD      . lactated ringers infusion   Intravenous Continuous Brand Males, MD   Stopped at 06/23/20 1230  . LORazepam (ATIVAN) injection 1 mg  1 mg Intravenous Q6H PRN Darel Hong D, NP   1 mg at 06/20/20 2131  . MEDLINE mouth rinse  15 mL Mouth Rinse BID Awilda Bill, NP      . melatonin tablet 5 mg  5 mg Oral QHS Sharion Settler, NP   5 mg at 06/23/20 2128  . methylPREDNISolone sodium succinate (SOLU-MEDROL) 40 mg/mL injection 20 mg  20 mg Intravenous BID Flora Lipps, MD   20 mg at 06/24/20 0910  . metoprolol tartrate (LOPRESSOR) injection 5 mg  5 mg Intravenous Q6H PRN Sharion Settler, NP   5 mg at 06/21/20 2049  . morphine 2 MG/ML injection 1-2 mg  1-2 mg Intravenous Q4H PRN Darel Hong D, NP   2 mg at 06/24/20 0910  . ondansetron (ZOFRAN) tablet 4 mg  4 mg Oral Q6H PRN Elwyn Reach, MD       Or  . ondansetron (ZOFRAN) injection 4 mg  4 mg Intravenous Q6H PRN Elwyn Reach, MD  4 mg at 06/21/20 1116  . polyethylene glycol (MIRALAX / GLYCOLAX) packet 17 g  17 g Oral Daily PRN Rust-Chester, Britton L, NP   17 g at 06/22/20 1125  . simethicone (MYLICON) chewable tablet 80 mg  80 mg Oral QID PRN Dessa Phi, DO   80 mg at 06/22/20 0538  . sodium chloride flush (NS) 0.9 % injection 10-40 mL  10-40 mL Intracatheter Q12H Dessa Phi, DO   10 mL at 06/24/20 1146  . sodium chloride flush (NS) 0.9 % injection 10-40 mL  10-40 mL Intracatheter PRN Dessa Phi, DO      . traMADol Veatrice Bourbon) tablet 50 mg  50 mg Oral Q6H PRN Dessa Phi, DO   50 mg at 06/19/20 1401  . traZODone (DESYREL) tablet 50 mg  50 mg Oral QHS PRN Darel Hong D, NP    50 mg at 06/22/20 2059    VITAL SIGNS: BP 140/78   Pulse (!) 117   Temp 98.9 F (37.2 C) (Axillary)   Resp (!) 22   Ht 5\' 10"  (1.778 m)   Wt 190 lb (86.2 kg)   LMP 06/16/2015 Comment: pt has had tubes tied   SpO2 99%   BMI 27.26 kg/m  Filed Weights   06/15/20 2114  Weight: 190 lb (86.2 kg)    Estimated body mass index is 27.26 kg/m as calculated from the following:   Height as of this encounter: 5\' 10"  (1.778 m).   Weight as of this encounter: 190 lb (86.2 kg).  LABS: CBC:    Component Value Date/Time   WBC 12.7 (H) 06/24/2020 0406   HGB 8.7 (L) 06/24/2020 0406   HCT 29.2 (L) 06/24/2020 0406   PLT 392 06/24/2020 0406   MCV 84.1 06/24/2020 0406   NEUTROABS 11.0 (H) 06/24/2020 0406   LYMPHSABS 0.7 06/24/2020 0406   MONOABS 0.8 06/24/2020 0406   EOSABS 0.0 06/24/2020 0406   BASOSABS 0.0 06/24/2020 0406   Comprehensive Metabolic Panel:    Component Value Date/Time   NA 140 06/24/2020 0406   K 4.1 06/24/2020 0406   CL 98 06/24/2020 0406   CO2 33 (H) 06/24/2020 0406   BUN 13 06/24/2020 0406   CREATININE 0.51 06/24/2020 0406   GLUCOSE 135 (H) 06/24/2020 0406   CALCIUM 8.8 (L) 06/24/2020 0406   AST 38 06/16/2020 0034   ALT 27 06/16/2020 0034   ALKPHOS 102 06/16/2020 0034   BILITOT 0.8 06/16/2020 0034   PROT 8.8 (H) 06/16/2020 0034   ALBUMIN 3.9 06/16/2020 0034   ALBUMIN 3.9 06/16/2020 0034    RADIOGRAPHIC STUDIES: DG Chest 1 View  Result Date: 06/16/2020 CLINICAL DATA:  Hemothorax on the right. EXAM: CHEST  1 VIEW COMPARISON:  Same day chest radiograph. FINDINGS: A right-sided pleural pigtail catheter is redemonstrated. There is interval improvement in aeration of the right lung with decreased size of a right hemothorax. A small right pneumothorax is not significantly changed in size. Severe right atelectasis/airspace disease is redemonstrated. Mild interstitial opacities in the left lung appear unchanged. There is no left pleural effusion or left pneumothorax.  The cardiac silhouette is partially obscured but appears unchanged. IMPRESSION: 1. Interval improvement in aeration of the right lung with decreased size of a right hemothorax. 2. Unchanged small right pneumothorax with pleural pigtail catheter in place. 3. Severe right atelectasis/airspace disease. Electronically Signed   By: Zerita Boers M.D.   On: 06/16/2020 14:52   DG Chest 1 View  Result Date: 06/16/2020 CLINICAL DATA:  Cough  and phlegm production. EXAM: CHEST  1 VIEW COMPARISON:  Chest radiograph dated 06/15/2020. FINDINGS: The cardiac silhouette is obscured. A right-sided pleural pigtail catheter is in unchanged location and there has been interval decrease in a large right pleural fluid collection (effusion versus hemothorax). There is no longer mediastinal shift to the left. There is a small right pneumothorax. Severe airspace and interstitial opacities are seen on the right. Mild interstitial opacities are seen on the left. There is no left pleural effusion or pneumothorax. The osseous structures are intact. IMPRESSION: 1. Small right pneumothorax with a right-sided pleural pigtail catheter in place. 2. Interval decrease in a large right pleural fluid collection (effusion versus hemothorax). 3. Severe airspace and interstitial opacities on the right. Mild interstitial opacities in the left. Electronically Signed   By: Zerita Boers M.D.   On: 06/16/2020 14:28   DG Chest 1 View  Result Date: 06/15/2020 CLINICAL DATA:  Short of breath, cough EXAM: CHEST  1 VIEW COMPARISON:  06/29/2015 FINDINGS: 2 frontal views of the chest demonstrate complete opacification of the right hemithorax, with slight mediastinal shift to the left, consistent with large right pleural effusion and underlying lung consolidation. Chronic areas of scarring are seen within the left chest. No pneumothorax. Cardiac silhouette is unremarkable. No acute bony abnormalities. IMPRESSION: 1. Opacification of the right hemithorax  consistent with large right pleural effusion and underlying right lung consolidation. Electronically Signed   By: Randa Ngo M.D.   On: 06/15/2020 21:42   CT Angio Chest PE W and/or Wo Contrast  Result Date: 06/15/2020 CLINICAL DATA:  Cough, shortness of breath EXAM: CT ANGIOGRAPHY CHEST WITH CONTRAST TECHNIQUE: Multidetector CT imaging of the chest was performed using the standard protocol during bolus administration of intravenous contrast. Multiplanar CT image reconstructions and MIPs were obtained to evaluate the vascular anatomy. CONTRAST:  100 mL Omnipaque 350 IV COMPARISON:  06/30/2015 FINDINGS: Cardiovascular: No filling defects in the pulmonary arteries to suggest pulmonary emboli. Heart is normal size. Aorta normal caliber. Mediastinum/Nodes: No visible mediastinal or hilar adenopathy. Bilateral axillary adenopathy, right greater than left. Index right axillary lymph node has a short axis diameter of 13 mm. Left axillary lymph node has a short axis diameter of 11 mm. Lungs/Pleura: Large right pleural effusion completely compressed seen in the right lung which is not aerated. This appears to have mass effect with shift of the right lung and mediastinal structures to the left. Areas of atelectasis or scarring in the left lung base. No effusion on the left. Upper Abdomen: Imaging into the upper abdomen demonstrates no acute findings. Musculoskeletal: Large mass noted in the right breast measuring 8 x 6 cm. Possible separate right breast mass more inferiorly in the right breast measuring 8 x 4 cm. Skin thickening or edema noted. Associated right axillary adenopathy. A few ill-defined sclerotic areas within the thoracic spine could reflect metastases. Review of the MIP images confirms the above findings. IMPRESSION: No evidence of pulmonary embolus. One, possibly 2 separate large masses within the right breast extending to the skin surface with associated adjacent skin thickening and right axillary  adenopathy. Findings compatible with right breast cancer. Large right pleural effusion which appears to have mass effect with complete collapse/compression of the right lung and shift of mediastinal structures to the left. Single enlarged left axillary lymph node. These results were called by telephone at the time of interpretation on 06/15/2020 at 10:43 pm to provider [facility] , who verbally acknowledged these results. Electronically Signed  By: Rolm Baptise M.D.   On: 06/15/2020 22:46   MR BRAIN W WO CONTRAST  Result Date: 06/24/2020 CLINICAL DATA:  History of metastatic breast cancer.  Staging. EXAM: MRI HEAD WITHOUT AND WITH CONTRAST TECHNIQUE: Multiplanar, multiecho pulse sequences of the brain and surrounding structures were obtained without and with intravenous contrast. CONTRAST:  35mL GADAVIST GADOBUTROL 1 MMOL/ML IV SOLN COMPARISON:  None. FINDINGS: Brain: The brain has a normal appearance without evidence of malformation, atrophy, old or acute small or large vessel infarction, mass lesion, hemorrhage, hydrocephalus or extra-axial collection. After contrast administration, no enhancement the brain or leptomeninges occurs. Vascular: Major vessels at the base of the brain show flow. Venous sinuses appear patent. Skull and upper cervical spine: 1 cm metastasis affecting the clivus. Scattered metastases in the upper cervical spine. Sinuses/Orbits: Clear/normal. Other: None significant. IMPRESSION: 1. Normal appearance of the brain itself. No evidence of metastatic disease to the brain or leptomeninges. 2. 1 cm metastasis affecting the clivus. Scattered metastases in the upper cervical spine. Electronically Signed   By: Nelson Chimes M.D.   On: 06/24/2020 11:53   US Venous Img Lower Unilateral Left (DVT)  Result Date: 06/16/2020 CLINICAL DATA:  Initial evaluation for acute left lower extremity edema. EXAM: LEFT LOWER EXTREMITY VENOUS DOPPLER ULTRASOUND TECHNIQUE: Gray-scale sonography with graded  compression, as well as color Doppler and duplex ultrasound were performed to evaluate the lower extremity deep venous systems from the level of the common femoral vein and including the common femoral, femoral, profunda femoral, popliteal and calf veins including the posterior tibial, peroneal and gastrocnemius veins when visible. The superficial great saphenous vein was also interrogated. Spectral Doppler was utilized to evaluate flow at rest and with distal augmentation maneuvers in the common femoral, femoral and popliteal veins. COMPARISON:  None. FINDINGS: Contralateral Common Femoral Vein: Respiratory phasicity is normal and symmetric with the symptomatic side. No evidence of thrombus. Normal compressibility. Common Femoral Vein: No evidence of thrombus. Normal compressibility, respiratory phasicity and response to augmentation. Saphenofemoral Junction: No evidence of thrombus. Normal compressibility and flow on color Doppler imaging. Profunda Femoral Vein: No evidence of thrombus. Normal compressibility and flow on color Doppler imaging. Femoral Vein: No evidence of thrombus. Normal compressibility, respiratory phasicity and response to augmentation. Popliteal Vein: No evidence of thrombus. Normal compressibility, respiratory phasicity and response to augmentation. Calf Veins: No evidence of thrombus. Normal compressibility and flow on color Doppler imaging. Superficial Great Saphenous Vein: No evidence of thrombus. Normal compressibility. Venous Reflux:  None. Other Findings:  None. IMPRESSION: No evidence of deep venous thrombosis. Electronically Signed   By: Jeannine Boga M.D.   On: 06/16/2020 01:30   DG Chest Port 1 View  Result Date: 06/23/2020 CLINICAL DATA:  Status post video-assisted thoracoscopy. EXAM: PORTABLE CHEST 1 VIEW COMPARISON:  June 22, 2020. FINDINGS: Stable cardiomegaly central pulmonary vascular congestion is noted. Right-sided chest tube has been removed. No pneumothorax  is noted. Stable diffuse right lung opacity is noted concerning for pneumonia with associated pleural effusion, including loculated fluid and right lung apex. Small loculated left pleural effusion is noted laterally. Left perihilar and basilar opacity is noted concerning for infiltrate or possibly edema. Bony thorax is unremarkable. IMPRESSION: Stable diffuse right lung opacity is noted concerning for pneumonia with associated pleural effusion, including loculated fluid and right lung apex. Small loculated left pleural effusion is noted laterally. Left perihilar and basilar opacity is noted concerning for infiltrate or possibly edema. Electronically Signed   By: Sabino Dick  Jr M.D.   On: 06/23/2020 14:59   DG Chest Port 1 View  Result Date: 06/22/2020 CLINICAL DATA:  Preoperative chest evaluation EXAM: PORTABLE CHEST 1 VIEW COMPARISON:  06/19/2020 FINDINGS: Chest tube remains in place on the right. Somewhat less pleural density on the right. There may be a small amount of air in the pleural space. Persistent diffuse infiltrate in the right lung. Patchy infiltrate in the mid and lower lung on the left is somewhat improved. IMPRESSION: 1. Chest tube remains in place on the right. Somewhat less pleural fluid. Question small amount of air in the pleural space. 2. Persistent diffuse infiltrate on the right. 3. Improved aeration in the left mid and lower lung. Electronically Signed   By: Nelson Chimes M.D.   On: 06/22/2020 14:43   DG Chest Port 1 View  Result Date: 06/19/2020 CLINICAL DATA:  Chest tube in place EXAM: PORTABLE CHEST 1 VIEW COMPARISON:  Chest radiograph from one day prior. FINDINGS: Right basilar pigtail chest tube is stable. Stable cardiomediastinal silhouette with mild cardiomegaly. No pneumothorax. Near complete opacification of the right hemithorax with minimally improved central right lung aeration. Extensive fluffy left parahilar lung opacities, slightly worsened. Left retrocardiac  consolidation is stable. IMPRESSION: 1. Stable right basilar chest tube without pneumothorax. 2. Near complete opacification of the right hemithorax with minimally improved central right lung aeration, which could be due to any combination of pleural effusion, atelectasis, pneumonia, edema or mass. 3. Stable left retrocardiac consolidation, favor atelectasis. 4. Fluffy left parahilar lung opacities, slightly worsened, favor pulmonary edema. Electronically Signed   By: Ilona Sorrel M.D.   On: 06/19/2020 15:04   DG CHEST PORT 1 VIEW  Result Date: 06/18/2020 CLINICAL DATA:  Malignant pleural effusion, RIGHT-side chest tube EXAM: PORTABLE CHEST 1 VIEW COMPARISON:  Portable exam 1042 hours compared to 11 x 21 FINDINGS: Pigtail RIGHT thoracostomy tube again seen. Persistent subtotal opacification of the RIGHT hemithorax by effusion and atelectasis, underlying tumor not excluded. Atelectasis versus consolidation of LEFT lower lobe with probable small pleural effusion. Stable heart size. No acute osseous findings. IMPRESSION: Persistent subtotal opacification of the RIGHT hemithorax by effusion and atelectasis, underlying tumor not excluded. Persistent infiltrate and probable pleural effusion at inferior LEFT chest. Electronically Signed   By: Lavonia Dana M.D.   On: 06/18/2020 16:09   DG Chest Port 1 View  Result Date: 06/17/2020 CLINICAL DATA:  Chest x-ray 06/17/2020 5 a.m. EXAM: PORTABLE CHEST 1 VIEW COMPARISON:  Chest x-ray 06/17/2020 4:10 a.m. FINDINGS: Interval increase in size of a likely large right pleural effusion with slight aeration of the right lung. Likely trace pleural effusion on the left. Interval increase in airspace interstitial opacities of the left lung that is most prominent in the left mid lung zone. Right pigtail chest tube overlies the right hemithorax. IMPRESSION: 1. Slightly improved almost complete whiteout of the right chest with slightly decreased in size large pleural effusion. 2.  Interval increase in interstitial and alveolar left lung opacities with possible trace left pleural effusion. Electronically Signed   By: Iven Finn M.D.   On: 06/17/2020 21:30   DG Chest Port 1 View  Result Date: 06/17/2020 CLINICAL DATA:  Pleural effusion EXAM: PORTABLE CHEST 1 VIEW COMPARISON:  Yesterday FINDINGS: More dense and homogeneous right sided opacity. Right pleural catheter in stable position. Left lower lobe opacity which also appears increased. Likely no cardiomegaly when allowing for mediastinal shift. IMPRESSION: White out of the right chest with mediastinal mass effect from large  pleural effusion. Retrocardiac infiltrate that could be infection or atelectasis. Electronically Signed   By: Monte Fantasia M.D.   On: 06/17/2020 05:00   DG Chest Portable 1 View  Result Date: 06/15/2020 CLINICAL DATA:  Right chest tube placement. EXAM: PORTABLE CHEST 1 VIEW COMPARISON:  Radiograph and CT earlier today. FINDINGS: Placement of right pigtail catheter. There is persistent complete opacification of the right hemithorax. There may be slight diminished mediastinal shift to the left. No visualized pneumothorax. No focal abnormality in the left lung. IMPRESSION: Placement of right pigtail catheter with persistent complete opacification of the right hemithorax. There may be slight diminished mediastinal shift to the left. No visualized pneumothorax. Electronically Signed   By: Keith Rake M.D.   On: 06/15/2020 23:40    PERFORMANCE STATUS (ECOG) : 3 - Symptomatic, >50% confined to bed  Review of Systems Unless otherwise noted, a complete review of systems is negative.  Physical Exam General: NAD Pulmonary: Unlabored Extremities: no edema, no joint deformities Skin: no rashes Neurological: Weakness but otherwise nonfocal  IMPRESSION: Follow-up visit.  Patient is status post VATS yesterday and no longer has chest tube in place.  She reports her breathing is improved.  She is  currently on O2 at 8 L.  Patient denies any symptomatic complaints at present.  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  Attempted to speak with her again regarding her overall goals.  Patient said that she had already seen multiple medical providers today and was not interested in further discussions.  PLAN: -Continue current scope of treatment -Will plan to follow patient in the clinic after discharge from the hospital   Time Total: 15 minutes  Visit consisted of counseling and education dealing with the complex and emotionally intense issues of symptom management and palliative care in the setting of serious and potentially life-threatening illness.Greater than 50%  of this time was spent counseling and coordinating care related to the above assessment and plan.  Signed by: Altha Harm, PhD, NP-C

## 2020-06-24 NOTE — Progress Notes (Signed)
CRITICAL CARE NOTE  Jessica Willis is a 54 y.o. African American female with a past medical history significant for anemia and a right pleural empyema strep pyogenes in 2016. She presented to [facility] ED on 06/15/2020 with complaints of shortness of breath and progressive cough for the last week. She also admits to a large mass on the right breast and skin changes that have been worsening over the last 6 months, though she never sought medical evaluation. Upon presentation, patient was noted to have a large right breast mass with a duplicate mass above the breast which looked necrotic with bullae and purulent discharge.  Workup in the ED revealed the following: Temp 98.2, BP 180/107, HR 144, RR 38, SpO2 94% on room air, sodium 143, potassium 3.3, chloride 101, CO2 25, BUN 22, creatinine 0.61, lactic acid 2.8, glucose 146, and calcium 9.5. Chest CT angiogram showed no evidence of PE. There was one or possibly two separate large masses seen on the right breast with extension into the skin surface and associated skin thickening and right axillary adenopathy. These findings are consistent with right breast cancer. Imaging also shows a large right pleural effusion with mass-effect, left shift of the mediastinal structures, and collapse of the right lung. Thoracentesis was performed in the ED with the insertion of a pigtail catheter, which yielded 500 cc of serosanguineous fluid. Cytology was sent 06/15/20.   She was admitted by the Hospitalist for further workup and treatment of acute hypoxic respiratory failure in the setting of large right pleural effusion (suspected malignant pleural effusion) due to right breast mass and sepsis in the setting of right breast cellulitis. On 06/16/2020, she became tachycardic, tachypneic, with worsening hypoxia. Follow up chest x-ray showed persistent complete opacification of the right hemithorax along with a slight mediastinal shift to the left. General surgery was consulted to  evaluate need for larger chest tube placement. General surgery placed pigtail to suction and drained 3.5L of dark serosanguinous fluid (did not appear like fresh blood). Cardiothoracic surgery was also consulted to ensure no concern for active bleeding or need for possible surgical intervention. Serial chest x-rays showed that the pleural effusion was improving, and output has slowed considerably. Oncology and Palliative Care have also been consulted.    CC  Follow up respiratory failure  SUBJECTIVE Patient remains critically ill Prognosis is guarded   BP 140/78   Pulse (!) 117   Temp 98.9 F (37.2 C) (Axillary)   Resp (!) 22   Ht 5\' 10"  (1.778 m)   Wt 86.2 kg   LMP 06/16/2015 Comment: pt has had tubes tied   SpO2 99%   BMI 27.26 kg/m    I/O last 3 completed shifts: In: 4008 [P.O.:240; I.V.:22; IV Piggyback:1050] Out: 1290 [Urine:1255; Chest Tube:35] No intake/output data recorded.  SpO2: 99 % O2 Flow Rate (L/min): 8 L/min FiO2 (%): 35 % (Per spo2)  Estimated body mass index is 27.26 kg/m as calculated from the following:   Height as of this encounter: 5\' 10"  (1.778 m).   Weight as of this encounter: 86.2 kg.  SIGNIFICANT EVENTS  11/3 - Right pigtail catheter inserted 11/3 - CTA chest: no evidence of PE; one or two large masses in right breast with extension to skin surface, compatible with right breast cancer; large right pleural effusion with mass effect and complete collapse/compression of right lung and left mediastinal shift; single enlarged left axillary lymph node 11/3 - CXR: right pigtail catheter placed with complete opacification of right hemithorax; no  visualized pneumothorax 11/4 - Venous US Left LE: no evidence of DVT 11/4 - CXR: small right pneumothorax with right-sided pleural effusion, pigtail catheter in place; interval decrease in a large right pleural fluid collection (effusion vs hemothorax); severe airspace and interstitial opacities on the right,  mild interstitial opacities on the left 11/5 - CXR: slightly improved, almost complete whiteout of right chest, slightly decreased size of pleural effusion; interval increase in interstitial and alveolar left lung opacities and possible trace left pleural effusion 11/5 - Currently on 8L [place]; hemodynamically stable on pressors, afebrile 11/6 - CXR: persistent subtotal opacification of right hemithorax by effusion and atelectasis, underlying tumor not excluded; persistent infiltrate and probable pleural effusion at inferior left chest 11/6 - Now on BiPAP, continued respiratory failure, pigtail catheter drains slowly and intermittently, has been blocked in the past 11/7 - CXR: near complete opacification of right hemithorax with minimally improved central right lung aeration; stable left retrocardiac consolidation, favor atelectasis; fluffy left parahilar lung opacities, slightly worsened, favor pulmonary edema 11/7 - Using BiPAP at night. Off BiPAP she appears better but is hypoxemic on 15L nasal cannula with SpO2 79-85%; able to eat today and in less distress; large bore chest tube held off due to tenuous respiratory status and significant tumor infiltration into right chest wall. General surgery prefers thoracic surgery evaluation for possible VATS and large bore tube thoracostomy. This would be for palliative purposes only - patient and family aware of this and wish to proceed. Still full code. 11/8 - Patient switched to HF [place] and appears to be much more comfortable; sitting in bed eating breakfast this morning and reports no new complaints; Dr. Genevive Bi was by to discuss surgery 11/9 - Patient is on HF [place] and does not report any new symptoms/concerns. Started bronchodilators and steroids. 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 VATS procedure scheduled for 06/23/20 with Dr. Genevive Bi. 06/22/20- patient on 70% HFNC sinus tach 120s, AAx3, LUE midline +, plan for thoracotomy with  pleurodesis tommorow.  06/24/20- patient reports feeling well. No overnight events    REVIEW OF SYSTEMS  ROS limited due to critical illness. General: improved appetite, improved fatigue, unexpected weight change Respiratory: positive for cough and mild shortness of breath  Cardiovascular: negative for palpitations, chest pain, lower extremity swelling Gastrointestinal: positive for gassiness; negative for abdominal pain, nausea/vomiting Musculoskeletal: movement exacerbates pain and coughing Neurological: negative for lightheadedness, syncope, headaches  PHYSICAL EXAMINATION  GENERAL: Frail, alert, calm and comfortable, no acute distress HEAD: Normocephalic, atraumatic.  EYES: Pupils equal, round, reactive to light.  No scleral icterus.  MOUTH: Moist mucosal membrane. NECK: Supple, no JVD PULMONARY: Lungs clear to auscultation bilaterally; deep breaths provoke cough; tachypnea present; chest tube with serosanguinous drainage CARDIOVASCULAR: S1 and S2. Tachycardia present. No murmurs, rubs, or gallops.  GASTROINTESTINAL: Soft, nontender, non-distended.  Positive bowel sounds.   MUSCULOSKELETAL: No swelling, clubbing, or edema.  NEUROLOGIC: No focal deficits present, alert and oriented x 3 PSYCHIATRIC: Normal mood and affect, not confused SKIN: Significant tumor infiltration from right breast with necrotic fungating mass, no active bleeding or purulent discharge  MEDICATIONS: I have reviewed all medications and confirmed regimen as documented   Scheduled Meds: . budesonide (PULMICORT) nebulizer solution  0.25 mg Nebulization BID  . Chlorhexidine Gluconate Cloth  6 each Topical Q0600  . docusate sodium  100 mg Oral BID  . ipratropium-albuterol  3 mL Nebulization BID  . mouth rinse  15 mL Mouth Rinse BID  . melatonin  5 mg  Oral QHS  . methylPREDNISolone (SOLU-MEDROL) injection  20 mg Intravenous BID  . sodium chloride flush  10-40 mL Intracatheter Q12H   Continuous  Infusions: . lactated ringers Stopped (06/23/20 1230)   PRN Meds:.acetaminophen **OR** acetaminophen, LORazepam, metoprolol tartrate, morphine injection, ondansetron **OR** ondansetron (ZOFRAN) IV, polyethylene glycol, simethicone, sodium chloride flush, traMADol, traZODone   CULTURE RESULTS   Recent Results (from the past 240 hour(s))  Respiratory Panel by RT PCR (Flu A&B, Covid) - Nasopharyngeal Swab     Status: None   Collection Time: 06/15/20  9:25 PM   Specimen: Nasopharyngeal Swab  Result Value Ref Range Status   SARS Coronavirus 2 by RT PCR NEGATIVE NEGATIVE Final    Comment: (NOTE) SARS-CoV-2 target nucleic acids are NOT DETECTED.  The SARS-CoV-2 RNA is generally detectable in upper respiratoy specimens during the acute phase of infection. The lowest concentration of SARS-CoV-2 viral copies this assay can detect is 131 copies/mL. A negative result does not preclude SARS-Cov-2 infection and should not be used as the sole basis for treatment or other patient management decisions. A negative result may occur with  improper specimen collection/handling, submission of specimen other than nasopharyngeal swab, presence of viral mutation(s) within the areas targeted by this assay, and inadequate number of viral copies (<131 copies/mL). A negative result must be combined with clinical observations, patient history, and epidemiological information. The expected result is Negative.  Fact Sheet for Patients:  PinkCheek.be  Fact Sheet for Healthcare Providers:  GravelBags.it  This test is no t yet approved or cleared by the [place] FDA and  has been authorized for detection and/or diagnosis of SARS-CoV-2 by FDA under an Emergency Use Authorization (EUA). This EUA will remain  in effect (meaning this test can be used) for the duration of the COVID-19 declaration under Section 564(b)(1) of the Act, 21 U.S.C. section  360bbb-3(b)(1), unless the authorization is terminated or revoked sooner.     Influenza A by PCR NEGATIVE NEGATIVE Final   Influenza B by PCR NEGATIVE NEGATIVE Final    Comment: (NOTE) The Xpert Xpress SARS-CoV-2/FLU/RSV assay is intended as an aid in  the diagnosis of influenza from Nasopharyngeal swab specimens and  should not be used as a sole basis for treatment. Nasal washings and  aspirates are unacceptable for Xpert Xpress SARS-CoV-2/FLU/RSV  testing.  Fact Sheet for Patients: PinkCheek.be  Fact Sheet for Healthcare Providers: GravelBags.it  This test is not yet approved or cleared by the [place] FDA and  has been authorized for detection and/or diagnosis of SARS-CoV-2 by  FDA under an Emergency Use Authorization (EUA). This EUA will remain  in effect (meaning this test can be used) for the duration of the  Covid-19 declaration under Section 564(b)(1) of the Act, 21  U.S.C. section 360bbb-3(b)(1), unless the authorization is  terminated or revoked. Performed at [facility], [address] [place], [place] [address]   Blood Culture (routine x 2)     Status: None   Collection Time: 06/15/20 10:09 PM   Specimen: BLOOD  Result Value Ref Range Status   Specimen Description BLOOD LEFT AC  Final   Special Requests   Final    BOTTLES DRAWN AEROBIC AND ANAEROBIC Blood Culture results may not be optimal due to an inadequate volume of blood received in culture bottles   Culture   Final    NO GROWTH 5 DAYS Performed at [facility], [address], [place],  [address]    Report Status 06/20/2020  FINAL  Final  Blood Culture (routine x 2)     Status: None   Collection Time: 06/15/20 10:11 PM   Specimen: BLOOD  Result Value Ref Range Status   Specimen Description BLOOD RIGHT [facility]  Final   Special Requests   Final    BOTTLES DRAWN AEROBIC AND ANAEROBIC Blood Culture adequate volume    Culture   Final    NO GROWTH 5 DAYS Performed at [facility], [address], [place], [place] [address]    Report Status 06/20/2020 FINAL  Final  Urine culture     Status: None   Collection Time: 06/15/20 11:40 PM   Specimen: Urine, Random  Result Value Ref Range Status   Specimen Description   Final    URINE, RANDOM Performed at [facility], [address], [place], [place] [address]    Special Requests   Final    NONE Performed at [facility], [address] [place], [place] [address]    Culture   Final    NO GROWTH Performed at [facility], [place] [address], [place], [place] [address]    Report Status 06/17/2020 FINAL  Final  Pleural Fluid culture (includes gram stain)     Status: None   Collection Time: 06/15/20 11:40 PM   Specimen: Pleural Fluid  Result Value Ref Range Status   Specimen Description   Final    PLEURAL Performed at [facility], [address], [place], [place] [address]    Special Requests   Final    NONE Performed at [facility], [place], [place] [address]    Gram Stain NO WBC SEEN NO ORGANISMS SEEN   Final   Culture   Final    NO GROWTH 3 DAYS Performed at [facility], [place] [address], [place], [place] [address]    Report Status 06/19/2020 FINAL  Final  MRSA PCR Screening     Status: None   Collection Time: 06/16/20 11:51 PM   Specimen: Nasopharyngeal  Result Value Ref Range Status   MRSA by PCR NEGATIVE NEGATIVE Final    Comment:        The GeneXpert MRSA Assay (FDA approved for NASAL specimens only), is one component of a comprehensive MRSA colonization surveillance program. It is not intended to diagnose MRSA infection nor to guide or monitor treatment for MRSA infections. Performed at [facility], [address] [place], [place] [address]   Aerobic/Anaerobic Culture (surgical/deep wound)     Status: None (Preliminary result)   Collection  Time: 06/20/20  3:20 PM   Specimen: Wound  Result Value Ref Range Status   Specimen Description   Final    WOUND Performed at [facility], [address], [place], [place] [address]    Special Requests   Final    RIGHT BREAST Performed at [facility], [address] [place], [place] [address]    Gram Stain   Final    RARE WBC PRESENT,BOTH PMN AND MONONUCLEAR NO ORGANISMS SEEN Performed at [facility], [place] [address] [place], [place] [address]    Culture   Final    NORMAL SKIN FLORA NO ANAEROBES ISOLATED; CULTURE IN PROGRESS FOR 5 DAYS    Report Status PENDING  Incomplete     LABS   CBC Latest Ref Rng & Units 06/24/2020 06/23/2020 06/22/2020  WBC 4.0 - 10.5 K/uL 12.7(H) 12.0(H) 10.0  Hemoglobin 12.0 - 15.0  g/dL 8.7(L) 8.9(L) 9.6(L)  Hematocrit 36 - 46 % 29.2(L) 28.8(L) 31.1(L)  Platelets 150 - 400 K/uL 392 361 378    BMP Latest Ref Rng & Units 06/24/2020 06/23/2020 06/22/2020  Glucose 70 - 99 mg/dL 135(H) 147(H) 170(H)  BUN 6 - 20 mg/dL 13 13 11   Creatinine 0.44 - 1.00 mg/dL 0.51 0.43(L) 0.46  Sodium 135 - 145 mmol/L 140 140 138  Potassium 3.5 - 5.1 mmol/L 4.1 4.0 3.9  Chloride 98 - 111 mmol/L 98 100 97(L)  CO2 22 - 32 mmol/L 33(H) 32 29  Calcium 8.9 - 10.3 mg/dL 8.8(L) 8.6(L) 8.7(L)    Intake/Output Summary (Last 24 hours) at 06/24/2020 1227 Last data filed at 06/24/2020 0200 Gross per 24 hour  Intake 1312.02 ml  Output --  Net 1312.02 ml    IMAGING   MR BRAIN W WO CONTRAST  Result Date: 06/24/2020 CLINICAL DATA:  History of metastatic breast cancer.  Staging. EXAM: MRI HEAD WITHOUT AND WITH CONTRAST TECHNIQUE: Multiplanar, multiecho pulse sequences of the brain and surrounding structures were obtained without and with intravenous contrast. CONTRAST:  50mL GADAVIST GADOBUTROL 1 MMOL/ML IV SOLN COMPARISON:  None. FINDINGS: Brain: The brain has a normal appearance without evidence of malformation, atrophy, old or acute small or large  vessel infarction, mass lesion, hemorrhage, hydrocephalus or extra-axial collection. After contrast administration, no enhancement the brain or leptomeninges occurs. Vascular: Major vessels at the base of the brain show flow. Venous sinuses appear patent. Skull and upper cervical spine: 1 cm metastasis affecting the clivus. Scattered metastases in the upper cervical spine. Sinuses/Orbits: Clear/normal. Other: None significant. IMPRESSION: 1. Normal appearance of the brain itself. No evidence of metastatic disease to the brain or leptomeninges. 2. 1 cm metastasis affecting the clivus. Scattered metastases in the upper cervical spine. Electronically Signed   By: Nelson Chimes M.D.   On: 06/24/2020 11:53   DG Chest Port 1 View  Result Date: 06/23/2020 CLINICAL DATA:  Status post video-assisted thoracoscopy. EXAM: PORTABLE CHEST 1 VIEW COMPARISON:  June 22, 2020. FINDINGS: Stable cardiomegaly central pulmonary vascular congestion is noted. Right-sided chest tube has been removed. No pneumothorax is noted. Stable diffuse right lung opacity is noted concerning for pneumonia with associated pleural effusion, including loculated fluid and right lung apex. Small loculated left pleural effusion is noted laterally. Left perihilar and basilar opacity is noted concerning for infiltrate or possibly edema. Bony thorax is unremarkable. IMPRESSION: Stable diffuse right lung opacity is noted concerning for pneumonia with associated pleural effusion, including loculated fluid and right lung apex. Small loculated left pleural effusion is noted laterally. Left perihilar and basilar opacity is noted concerning for infiltrate or possibly edema. Electronically Signed   By: Marijo Conception M.D.   On: 06/23/2020 14:59    Best Practice: Diet: Regular Diet Pain/Anxiety/Delirium protocol (if indicated): Prn Morphine VAP protocol (if indicated): N/A DVT prophylaxis: Lovenox GI prophylaxis: N/A Glucose control: N/A  Mobility:  As tolerated  Code Status: full code   ASSESSMENT AND PLAN SYNOPSIS 54 y.o. female presents with acute hypoxic respiratory failure in the setting of large right malignant pleural effusion. Complex right breast mass is present infiltrating into right chest wall with significant overlying cellulitic skin changes. Cytology reporting metastatic adenocarcinoma. Currently on HF [place] and scheduled to undergo VATS procedure with Dr. Genevive Bi on Thursday 06/23/2020.  ACUTE HYPOXIC RESPIRATORY FAILURE in the setting of large right malignant pleural effusion  - Status post thoracentesis and chest tube insertion -Thoracic surgery on case -  appreciate input -patient In no pain today, using IS at bedside    RIGHT BREAST MASS - Poor prognosis - Cytology positive for malignancy (metastatic adenocarcinoma, favor mammary primary) - General surgery signed off 06/20/2020 - Oncology following - Palliative care following  SEPSIS secondary to right breast cellulitis - Sepsis present on admission with tachycardia, tachypnea, leukocytosis -completed IV antibiotics   ELECTROLYTES - Follow labs as needed - Replace as needed - Pharmacy consultation and following   DVT/GI PRX ordered and assessed TRANSFUSIONS AS NEEDED MONITOR FSBS I Assessed the need for Labs I Assessed the need for Foley I Assessed the need for Central Venous Line Family Discussion when available I Assessed the need for Mobilization I made an Assessment of medications to be adjusted accordingly Safety Risk assessment completed   CASE DISCUSSED IN MULTIDISCIPLINARY ROUNDS WITH ICU TEAM  Critical Care Time devoted to patient care services described in this note is  33 minutes.   Overall, patient is critically ill, prognosis is guarded.      Ottie Glazier, M.D.  Pulmonary & College

## 2020-06-24 NOTE — Progress Notes (Signed)
Patient ID: Jessica Willis, female   DOB: Dec 25, 1965, 54 y.o.   MRN: [id number]  Spoke with the patient's daughter Merlene Laughter on the phone.  I updated the patient's daughter on the patient having stage IV metastatic breast cancer.  The goal of any treatment with chemotherapy will be palliative in nature trying to slow the process down and not curative in nature.  The patient came off the heated high flow nasal cannula and now on bubble nasal cannula and she will be transferred out of the ICU.  The patient cannot go home on high flow nasal cannula.  Will have to be converted over to regular nasal cannula prior to making any disposition.  Oncology may want a port placed prior to disposition also.  Dr Loletha Grayer

## 2020-06-24 NOTE — Progress Notes (Signed)
OT Cancellation Note  Patient Details Name: Jessica Willis MRN: [id number] DOB: 08-Oct-1965   Cancelled Treatment:    Reason Eval/Treat Not Completed: Other (comment). Consult received, chart reviewed. Pt pending cervical and thoracic imaging. Will hold OT Evaluation this date and re-attempt next date pending imaging, plan of care, and as medically appropriate.   Jeni Salles, MPH, MS, OTR/L ascom [phone] 06/24/20, 3:26 PM

## 2020-06-24 NOTE — Progress Notes (Signed)
PT Cancellation Note  Patient Details Name: Jessica Willis MRN: [id number] DOB: [birth year]   Cancelled Treatment:    Reason Eval/Treat Not Completed: Medical issues which prohibited therapy (Chart reviewed, PT evaluation deferred. HR remains elevated in 110s. MRI studies pending. WIll continue to follow and evaluate once appropriate.)  3:37 PM, 06/24/20 Etta Grandchild, PT, DPT Physical Therapist - [facility]  [phone] ([place])    [name] 06/24/2020, 3:37 PM

## 2020-06-24 NOTE — Progress Notes (Signed)
Patient ID: Jessica Willis, female   DOB: [birth year], 54 y.o.   MRN: [id number] [facility] PROGRESS NOTE  Margan Elias URK:[id number] DOB: 10/23/1965 DOA: 06/15/2020 PCP: System, Provider Not In  HPI/Subjective: Patient states that her breathing is okay.  This morning she stated that her right arm was weaker from last night.  No pain.  She felt it may have been with the blood pressure cuff since he is able to move her right arm a little bit better now.  Objective: Vitals:   06/24/20 0900 06/24/20 1000  BP: (!) 147/87 140/78  Pulse: (!) 118 (!) 117  Resp: (!) 24 (!) 22  Temp: 98.9 F (37.2 C)   SpO2: 100% 99%    Intake/Output Summary (Last 24 hours) at 06/24/2020 1345 Last data filed at 06/24/2020 0200 Gross per 24 hour  Intake 1262.02 ml  Output --  Net 1262.02 ml   Filed Weights   06/15/20 2114  Weight: 86.2 kg    ROS: Review of Systems  Respiratory: Positive for cough and shortness of breath.   Cardiovascular: Negative for chest pain.  Gastrointestinal: Negative for abdominal pain, nausea and vomiting.   Exam: Physical Exam HENT:     Head: Normocephalic.     Mouth/Throat:     Pharynx: No oropharyngeal exudate.  Eyes:     General: Lids are normal.     Extraocular Movements: Extraocular movements intact.     Pupils: Pupils are equal, round, and reactive to light.  Cardiovascular:     Rate and Rhythm: Regular rhythm. Tachycardia present.     Heart sounds: Normal heart sounds, S1 normal and S2 normal.  Pulmonary:     Breath sounds: Examination of the right-middle field reveals decreased breath sounds and rhonchi. Examination of the right-lower field reveals decreased breath sounds and rhonchi. Examination of the left-lower field reveals decreased breath sounds and rhonchi. Decreased breath sounds and rhonchi present. No wheezing.  Abdominal:     Palpations: Abdomen is soft.     Tenderness: There is no abdominal tenderness.  Musculoskeletal:      Right lower leg: No swelling.     Left lower leg: No swelling.  Skin:    General: Skin is warm.     Findings: No rash.  Neurological:     Mental Status: She is alert and oriented to person, place, and time.     Comments: 4 out of 5 power right arm.  5 out of 5 power left arm and legs.       Data Reviewed: Basic Metabolic Panel: Recent Labs  Lab 06/18/20 0529 06/19/20 0450 06/20/20 0506 06/21/20 0636 06/22/20 0627 06/23/20 0439 06/24/20 0406  NA 134*   < > 138 138 138 140 140  K 4.1   < > 4.3 3.7 3.9 4.0 4.1  CL 99   < > 101 100 97* 100 98  CO2 28   < > 28 30 29  32 33*  GLUCOSE 106*   < > 95 100* 170* 147* 135*  BUN 14   < > 12 9 11 13 13   CREATININE 0.66   < > 0.52 0.34* 0.46 0.43* 0.51  CALCIUM 8.7*   < > 8.5* 8.4* 8.7* 8.6* 8.8*  MG 1.9  --   --   --  2.3 2.4 2.3  PHOS 3.2  --   --   --  3.1 2.7 2.8   < > = values in this interval not displayed.   CBC: Recent Labs  Lab  06/20/20 0506 06/21/20 0636 06/22/20 0627 06/23/20 0439 06/24/20 0406  WBC 11.7* 9.4 10.0 12.0* 12.7*  NEUTROABS  --   --   --   --  11.0*  HGB 9.7* 9.4* 9.6* 8.9* 8.7*  HCT 31.1* 30.7* 31.1* 28.8* 29.2*  MCV 83.4 82.1 82.9 82.8 84.1  PLT 325 317 378 361 392   BNP (last 3 results) Recent Labs    06/15/20 2125  BNP 49.6      Recent Results (from the past 240 hour(s))  Respiratory Panel by RT PCR (Flu A&B, Covid) - Nasopharyngeal Swab     Status: None   Collection Time: 06/15/20  9:25 PM   Specimen: Nasopharyngeal Swab  Result Value Ref Range Status   SARS Coronavirus 2 by RT PCR NEGATIVE NEGATIVE Final    Comment: (NOTE) SARS-CoV-2 target nucleic acids are NOT DETECTED.  The SARS-CoV-2 RNA is generally detectable in upper respiratoy specimens during the acute phase of infection. The lowest concentration of SARS-CoV-2 viral copies this assay can detect is 131 copies/mL. A negative result does not preclude SARS-Cov-2 infection and should not be used as the sole basis for treatment  or other patient management decisions. A negative result may occur with  improper specimen collection/handling, submission of specimen other than nasopharyngeal swab, presence of viral mutation(s) within the areas targeted by this assay, and inadequate number of viral copies (<131 copies/mL). A negative result must be combined with clinical observations, patient history, and epidemiological information. The expected result is Negative.  Fact Sheet for Patients:  PinkCheek.be  Fact Sheet for Healthcare Providers:  GravelBags.it  This test is no t yet approved or cleared by the [place] FDA and  has been authorized for detection and/or diagnosis of SARS-CoV-2 by FDA under an Emergency Use Authorization (EUA). This EUA will remain  in effect (meaning this test can be used) for the duration of the COVID-19 declaration under Section 564(b)(1) of the Act, 21 U.S.C. section 360bbb-3(b)(1), unless the authorization is terminated or revoked sooner.     Influenza A by PCR NEGATIVE NEGATIVE Final   Influenza B by PCR NEGATIVE NEGATIVE Final    Comment: (NOTE) The Xpert Xpress SARS-CoV-2/FLU/RSV assay is intended as an aid in  the diagnosis of influenza from Nasopharyngeal swab specimens and  should not be used as a sole basis for treatment. Nasal washings and  aspirates are unacceptable for Xpert Xpress SARS-CoV-2/FLU/RSV  testing.  Fact Sheet for Patients: PinkCheek.be  Fact Sheet for Healthcare Providers: GravelBags.it  This test is not yet approved or cleared by the [place] FDA and  has been authorized for detection and/or diagnosis of SARS-CoV-2 by  FDA under an Emergency Use Authorization (EUA). This EUA will remain  in effect (meaning this test can be used) for the duration of the  Covid-19 declaration under Section 564(b)(1) of the Act, 21  U.S.C.  section 360bbb-3(b)(1), unless the authorization is  terminated or revoked. Performed at [facility], [address] [place], [place] [address]   Blood Culture (routine x 2)     Status: None   Collection Time: 06/15/20 10:09 PM   Specimen: BLOOD  Result Value Ref Range Status   Specimen Description BLOOD LEFT [facility]  Final   Special Requests   Final    BOTTLES DRAWN AEROBIC AND ANAEROBIC Blood Culture results may not be optimal due to an inadequate volume of blood received in culture bottles   Culture   Final    NO GROWTH 5  DAYS Performed at [facility], [address] [place], [place] [address]    Report Status 06/20/2020 FINAL  Final  Blood Culture (routine x 2)     Status: None   Collection Time: 06/15/20 10:11 PM   Specimen: BLOOD  Result Value Ref Range Status   Specimen Description BLOOD RIGHT [facility]  Final   Special Requests   Final    BOTTLES DRAWN AEROBIC AND ANAEROBIC Blood Culture adequate volume   Culture   Final    NO GROWTH 5 DAYS Performed at [facility], [address] [place], [place] [address]    Report Status 06/20/2020 FINAL  Final  Urine culture     Status: None   Collection Time: 06/15/20 11:40 PM   Specimen: Urine, Random  Result Value Ref Range Status   Specimen Description   Final    URINE, RANDOM Performed at [facility], [address] [place], [place] [address]    Special Requests   Final    NONE Performed at [facility], [address], [place], [place] [address]    Culture   Final    NO GROWTH Performed at [facility], [place] [address] [place], [place] [address]    Report Status 06/17/2020 FINAL  Final  Pleural Fluid culture (includes gram stain)     Status: None   Collection Time: 06/15/20 11:40 PM   Specimen: Pleural Fluid  Result Value Ref Range Status   Specimen Description   Final    PLEURAL Performed at [facility], [address], [place], [place] [address]     Special Requests   Final    NONE Performed at [facility], [place], [place] [address]    Gram Stain NO WBC SEEN NO ORGANISMS SEEN   Final   Culture   Final    NO GROWTH 3 DAYS Performed at [facility], [place] [address] [place], [place] [address]    Report Status 06/19/2020 FINAL  Final  MRSA PCR Screening     Status: None   Collection Time: 06/16/20 11:51 PM   Specimen: Nasopharyngeal  Result Value Ref Range Status   MRSA by PCR NEGATIVE NEGATIVE Final    Comment:        The GeneXpert MRSA Assay (FDA approved for NASAL specimens only), is one component of a comprehensive MRSA colonization surveillance program. It is not intended to diagnose MRSA infection nor to guide or monitor treatment for MRSA infections. Performed at [facility], [address] [place], [place] [address]   Aerobic/Anaerobic Culture (surgical/deep wound)     Status: None (Preliminary result)   Collection Time: 06/20/20  3:20 PM   Specimen: Wound  Result Value Ref Range Status   Specimen Description   Final    WOUND Performed at [facility], [address], [place],  [address]    Special Requests   Final    RIGHT BREAST Performed at [facility], [address] [place],  [address]    Gram Stain   Final    RARE WBC PRESENT,BOTH PMN AND MONONUCLEAR NO ORGANISMS SEEN Performed at [facility], [place] [address] [place],  [address]    Culture   Final    NORMAL SKIN FLORA NO ANAEROBES ISOLATED; CULTURE IN PROGRESS FOR 5 DAYS    Report Status PENDING  Incomplete     Studies: MR BRAIN W WO CONTRAST  Result  Date: 06/24/2020 CLINICAL DATA:  History of metastatic breast cancer.  Staging. EXAM: MRI HEAD WITHOUT AND WITH CONTRAST TECHNIQUE: Multiplanar, multiecho pulse sequences of the brain and surrounding structures were obtained without and with intravenous contrast. CONTRAST:  34mL GADAVIST GADOBUTROL 1  MMOL/ML IV SOLN COMPARISON:  None. FINDINGS: Brain: The brain has a normal appearance without evidence of malformation, atrophy, old or acute small or large vessel infarction, mass lesion, hemorrhage, hydrocephalus or extra-axial collection. After contrast administration, no enhancement the brain or leptomeninges occurs. Vascular: Major vessels at the base of the brain show flow. Venous sinuses appear patent. Skull and upper cervical spine: 1 cm metastasis affecting the clivus. Scattered metastases in the upper cervical spine. Sinuses/Orbits: Clear/normal. Other: None significant. IMPRESSION: 1. Normal appearance of the brain itself. No evidence of metastatic disease to the brain or leptomeninges. 2. 1 cm metastasis affecting the clivus. Scattered metastases in the upper cervical spine. Electronically Signed   By: Nelson Chimes M.D.   On: 06/24/2020 11:53   DG Chest Port 1 View  Result Date: 06/23/2020 CLINICAL DATA:  Status post video-assisted thoracoscopy. EXAM: PORTABLE CHEST 1 VIEW COMPARISON:  June 22, 2020. FINDINGS: Stable cardiomegaly central pulmonary vascular congestion is noted. Right-sided chest tube has been removed. No pneumothorax is noted. Stable diffuse right lung opacity is noted concerning for pneumonia with associated pleural effusion, including loculated fluid and right lung apex. Small loculated left pleural effusion is noted laterally. Left perihilar and basilar opacity is noted concerning for infiltrate or possibly edema. Bony thorax is unremarkable. IMPRESSION: Stable diffuse right lung opacity is noted concerning for pneumonia with associated pleural effusion, including loculated fluid and right lung apex. Small loculated left pleural effusion is noted laterally. Left perihilar and basilar opacity is noted concerning for infiltrate or possibly edema. Electronically Signed   By: Marijo Conception M.D.   On: 06/23/2020 14:59   DG Chest Port 1 View  Result Date: 06/22/2020 CLINICAL  DATA:  Preoperative chest evaluation EXAM: PORTABLE CHEST 1 VIEW COMPARISON:  06/19/2020 FINDINGS: Chest tube remains in place on the right. Somewhat less pleural density on the right. There may be a small amount of air in the pleural space. Persistent diffuse infiltrate in the right lung. Patchy infiltrate in the mid and lower lung on the left is somewhat improved. IMPRESSION: 1. Chest tube remains in place on the right. Somewhat less pleural fluid. Question small amount of air in the pleural space. 2. Persistent diffuse infiltrate on the right. 3. Improved aeration in the left mid and lower lung. Electronically Signed   By: Nelson Chimes M.D.   On: 06/22/2020 14:43    Scheduled Meds: . budesonide (PULMICORT) nebulizer solution  0.25 mg Nebulization BID  . Chlorhexidine Gluconate Cloth  6 each Topical Q0600  . docusate sodium  100 mg Oral BID  . ipratropium-albuterol  3 mL Nebulization BID  . mouth rinse  15 mL Mouth Rinse BID  . melatonin  5 mg Oral QHS  . methylPREDNISolone (SOLU-MEDROL) injection  20 mg Intravenous BID  . sodium chloride flush  10-40 mL Intracatheter Q12H   Continuous Infusions: . lactated ringers Stopped (06/23/20 1230)    Assessment/Plan:  1. Acute hypoxic respiratory failure.  This morning the patient was on heated high flow nasal cannula and looks like this afternoon got flipped over to bubble high flow nasal cannula 8 L. 2. Stage IV metastatic breast cancer.  Patient has right breast cancer extending through the skin, pleural fluid cytology for  adenocarcinoma.  MRI of the brain did see metastatic lesions in the cervical spine.  Patient already on Solu-Medrol.  Case discussed with oncology.  We will get MRI of the cervical and thoracic spine tomorrow. 3. Large right malignant pleural effusion and hemothorax.  Dr. Genevive Bi was unable to complete a VATS procedure yesterday and removed chest tube. 4. Weakness right arm.  MRI of the brain negative for metastases to the brain.   They did see some scattered metastases to the cervical spine.  We will get a cervical spine MRI and thoracic spine MRI tomorrow.  Already on steroids.  PT and OT evaluations. 5. Sepsis, present on admission with acute hypoxic respiratory failure.  Patient completed antibiotics. 6. Iron deficiency anemia.  Hemoglobin today 8.7. 7. Tachycardia.  Continue to monitor closely. 8. Weakness.  Physical therapy evaluation     Code Status:     Code Status Orders  (From admission, onward)         Start     Ordered   06/16/20 0010  Full code  Continuous        06/16/20 0010        Code Status History    Date Active Date Inactive Code Status Order ID Comments User Context   06/23/2015 2341 06/30/2015 1944 Full Code [id number]  Lance Coon, MD Inpatient   Advance Care Planning Activity    Advance Directive Documentation     Most Recent Value  Type of Advance Directive Living will, Healthcare Power of Attorney  Pre-existing out of facility DNR order (yellow form or pink MOST form) --  "MOST" Form in Place? --     Family Communication: Left message for son on the phone.  Left message for daughter on the phone. Disposition Plan: Status is: Inpatient  Dispo: The patient is from: home              Anticipated d/c is to: home              Anticipated d/c date is: will need to be off high flow nasal cannula before any decision made.              Patient currently requiring too much oxygen in order to decide on going home at this point.  Consultants:  Cardiothoracic surgery  Oncology  Palliative care  Critical care specialist  Procedures:  Chest tube and removal  Antibiotics:  Completed  Time spent: 27 minutes  [place]

## 2020-06-24 NOTE — Anesthesia Postprocedure Evaluation (Signed)
Anesthesia Post Note  Patient: Jessica Willis  Procedure(s) Performed: ATTEMPTED VIDEO ASSISTED THORACOSCOPY (VATS) (Right Chest)  Patient location during evaluation: SICU Anesthesia Type: General Level of consciousness: awake and alert Pain management: pain level controlled Vital Signs Assessment: post-procedure vital signs reviewed and stable Respiratory status: spontaneous breathing and patient connected to nasal cannula oxygen Cardiovascular status: stable Postop Assessment: no apparent nausea or vomiting Anesthetic complications: no   No complications documented.   Last Vitals:  Vitals:   06/24/20 0400 06/24/20 0500  BP: 128/68 (!) 120/96  Pulse: 100 (!) 106  Resp: 19 (!) 23  Temp:    SpO2: 98% 95%    Last Pain:  Vitals:   06/24/20 0200  TempSrc: Axillary  PainSc: 0-No pain                 Rosebud Poles C

## 2020-06-24 NOTE — Progress Notes (Signed)
Tumor Board Documentation  Jessica Willis was presented by Dr Tasia Catchings at our Tumor Board on 06/23/2020, which included representatives from medical oncology, radiation oncology, surgical oncology, internal medicine, navigation, pathology, radiology, surgical, pharmacy, genetics, research, palliative care, pulmonology.  Jessica Willis currently presents as a new patient, for [place], for new positive pathology with history of the following treatments: surgical intervention(s).  Additionally, we reviewed previous medical and familial history, history of present illness, and recent lab results along with all available histopathologic and imaging studies. The tumor board considered available treatment options and made the following recommendations: Chemotherapy, Surgery    The following procedures/referrals were also placed: No orders of the defined types were placed in this encounter.   Clinical Trial Status: not discussed   Staging used: AJCC Stage Group  AJCC Staging: T: 4 N: 1 M: 1 Group: Stage IV Adenocarcinoma of Breast ,ER PR negative, HER 2 Pending   National site-specific guidelines NCCN were discussed with respect to the case.  Tumor board is a meeting of clinicians from various specialty areas who evaluate and discuss patients for whom a multidisciplinary approach is being considered. Final determinations in the plan of care are those of the provider(s). The responsibility for follow up of recommendations given during tumor board is that of the provider.   Today's extended care, comprehensive team conference, Jolita was not present for the discussion and was not examined.   Multidisciplinary Tumor Board is a multidisciplinary case peer review process.  Decisions discussed in the Multidisciplinary Tumor Board reflect the opinions of the specialists present at the conference without having examined the patient.  Ultimately, treatment and diagnostic decisions rest with the primary  provider(s) and the patient.

## 2020-06-24 NOTE — Progress Notes (Signed)
Per Dr Leslye Peer verbal order for patient to travel without RN or cardiac monitoring. Patient is stable on 8L [place] with O2 stat 98%-100%.

## 2020-06-24 NOTE — Plan of Care (Signed)
Continuing with plan of care. 

## 2020-06-25 ENCOUNTER — Inpatient Hospital Stay: Payer: Medicaid Other

## 2020-06-25 DIAGNOSIS — K219 Gastro-esophageal reflux disease without esophagitis: Secondary | ICD-10-CM

## 2020-06-25 LAB — AEROBIC/ANAEROBIC CULTURE W GRAM STAIN (SURGICAL/DEEP WOUND): Culture: NORMAL

## 2020-06-25 IMAGING — MR MR TOTAL SPINE METS SCREENING
4 series · 25 of 48 positions shown · IV contrast (gadavist)
Comparison: Head MRI [DATE]. Chest CTA [DATE]. CT abdomen
and pelvis [DATE].

CLINICAL DATA: Metastatic breast cancer.

EXAM:
MRI TOTAL SPINE WITHOUT AND WITH CONTRAST
TECHNIQUE: Multisequence MR imaging of the spine from the cervical spine to the
sacrum was performed prior to and following IV contrast
administration for evaluation of spinal metastatic disease. Large
field of view sagittal imaging was performed. No axial imaging was
obtained.
CONTRAST:  9mL GADAVIST GADOBUTROL 1 MMOL/ML IV SOLN

[Series 19: T1 · sagittal · 5.0mm · 1.29mm/px · 9 of 19 slices shown]
[im 1/19]
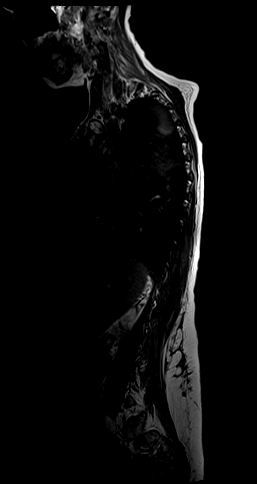
[im 4/19]
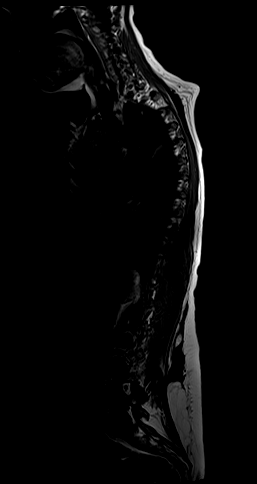
[im 5/19]
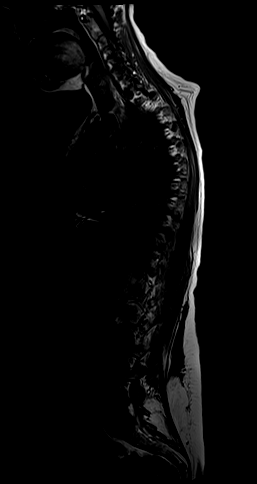
[im 9/19]
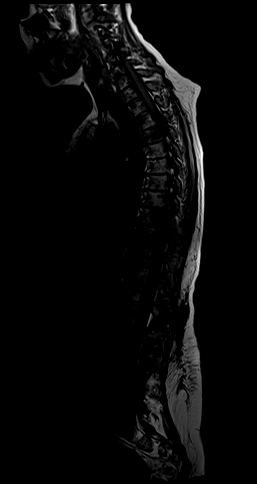
[im 10/19]
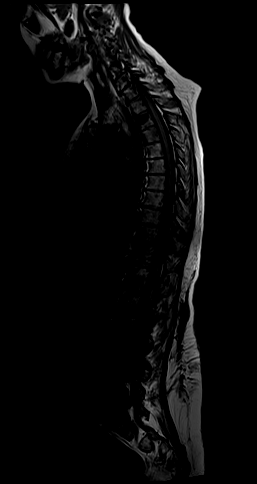
[im 14/19]
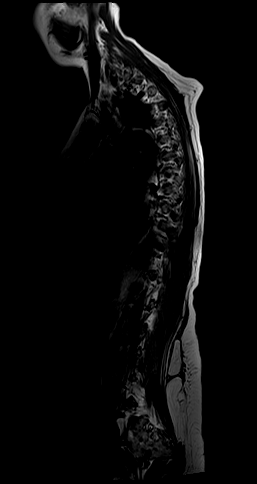
[im 15/19]
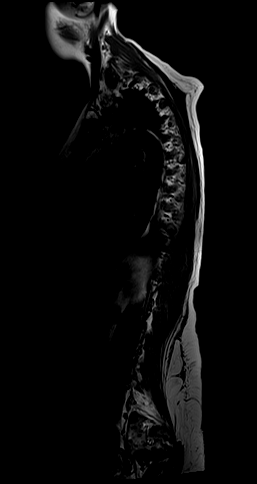
[im 17/19]
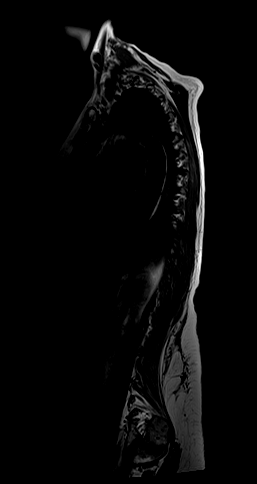
[im 19/19]
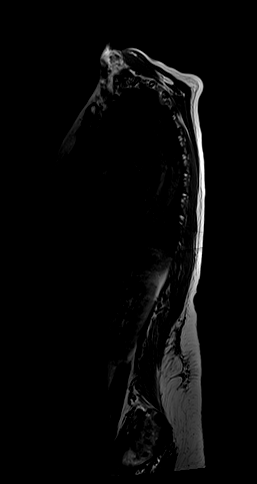

[Series 22: sag stir_comp · sagittal · 5.0mm · 0.64mm/px · 4 of 19 slices shown]
[im 1/19]
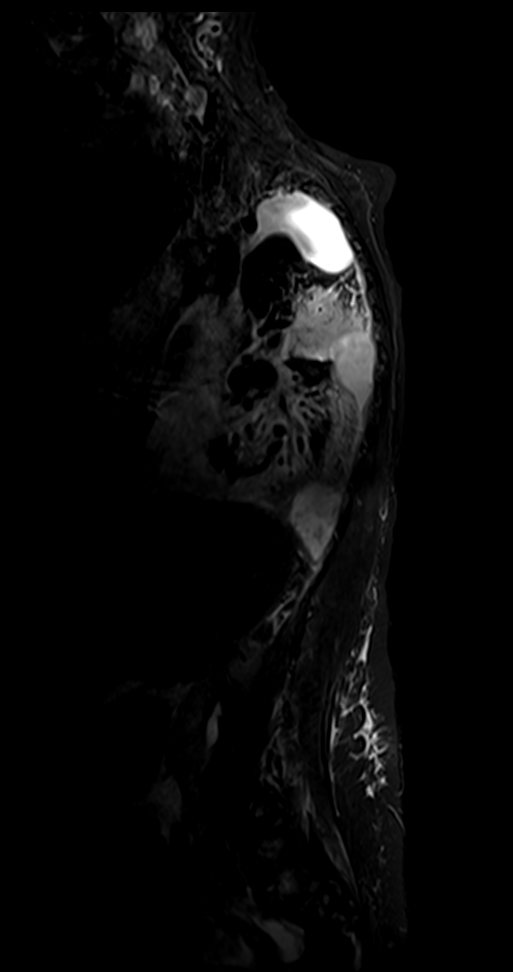
[im 4/19]
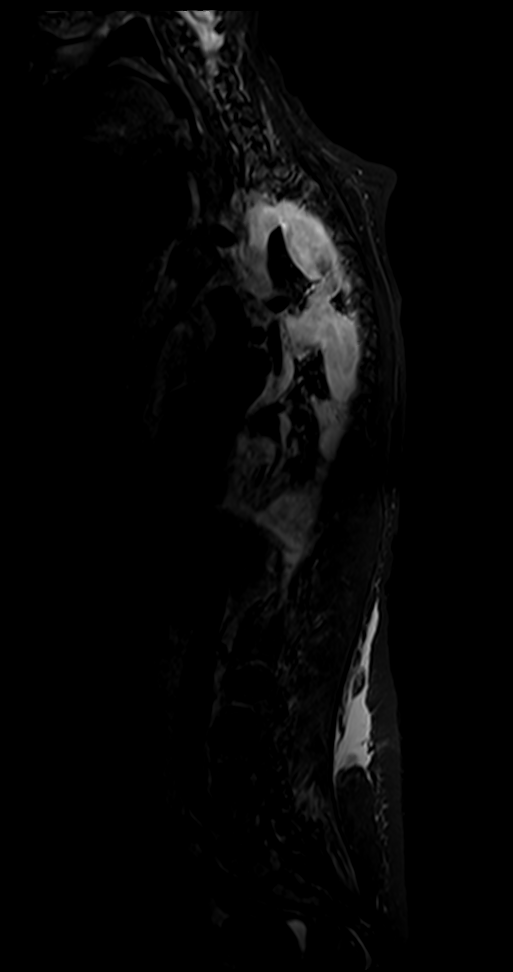
[im 10/19]
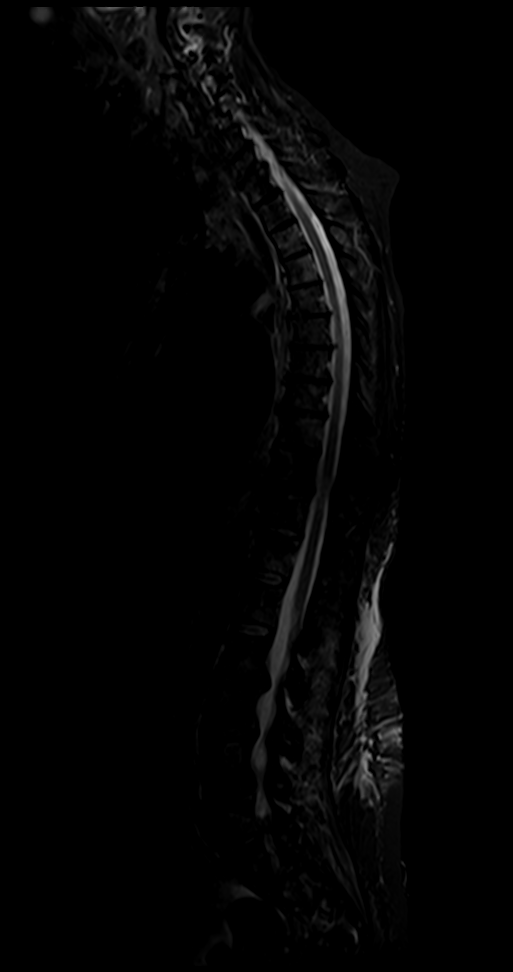
[im 17/19]
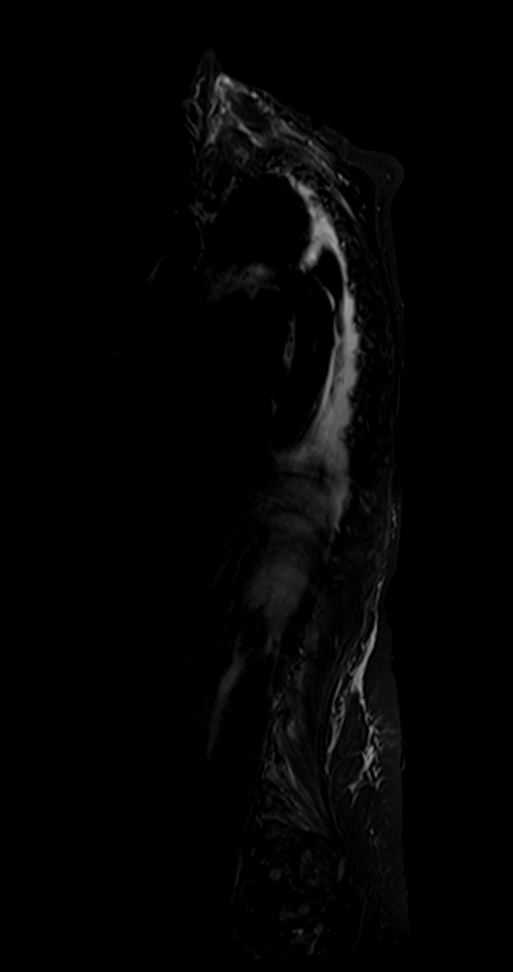

[Series 25: sag t2_comp · sagittal · 5.0mm · 1.29mm/px · 3 of 19 slices shown]
[im 4/19]
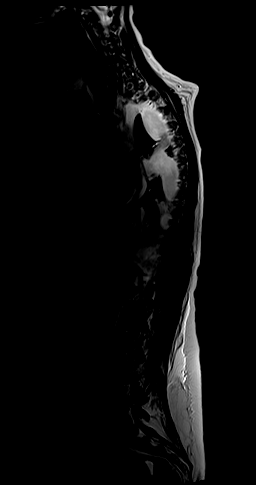
[im 10/19]
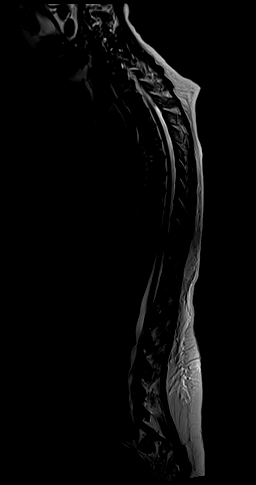
[im 17/19]
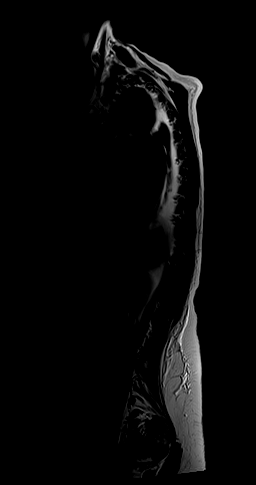

[Series 30: T1 fat-sat · sagittal · 5.0mm · 1.29mm/px · 9 of 18 slices shown]
[im 1/18]
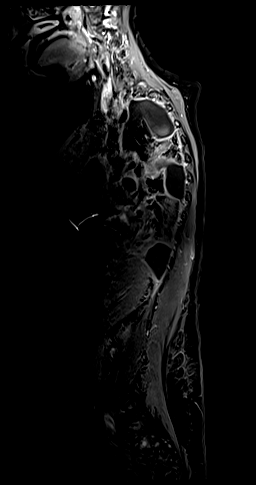
[im 4/18]
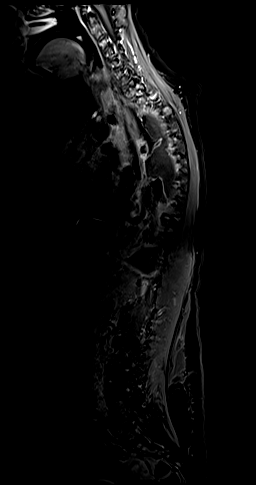
[im 5/18]
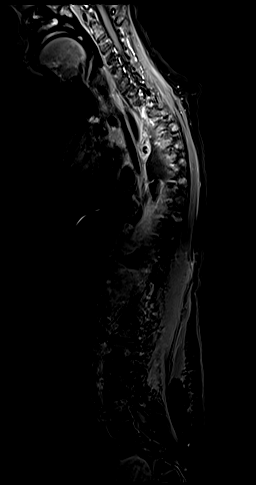
[im 8/18]
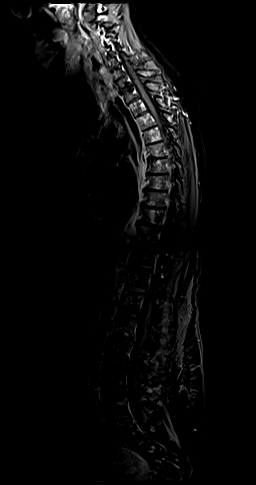
[im 10/18]
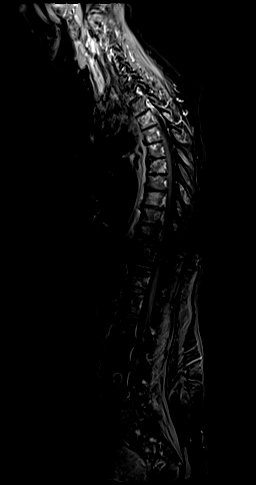
[im 13/18]
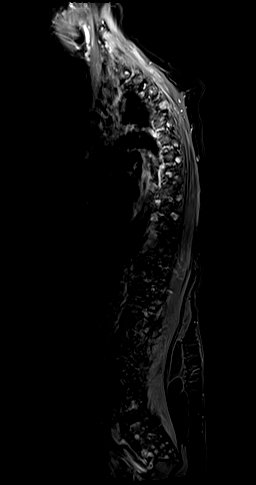
[im 14/18]
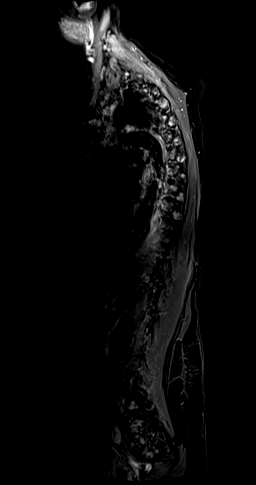
[im 16/18]
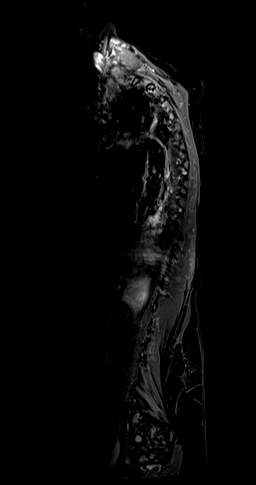
[im 18/18]
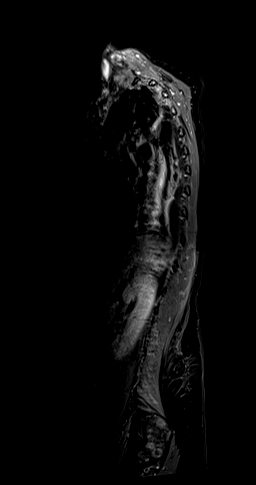

[25 of 48 positions shown; findings below may reference images not displayed]

FINDINGS: MRI CERVICAL SPINE FINDINGS

Alignment: Cervical spine straightening.  No significant listhesis.

Vertebrae: Heterogeneous bone marrow signal throughout the cervical
spine with patchy areas of enhancement consistent with osseous
metastatic disease. No evidence of epidural tumor.

Cord: Normal signal.  No abnormal intradural enhancement.

Posterior Fossa, vertebral arteries, paraspinal tissues: Posterior
fossa more fully evaluated on the recent head MRI. No gross
paraspinal mass on this study which does not include axial images.

Disc levels: Moderate to severe disc space narrowing from C4-5 to
C7-T1 with degenerative endplate changes, disc bulging, and
spurring. Assessment for degenerative stenosis is limited in the
absence of axial imaging. No compressive spinal stenosis.

MRI THORACIC SPINE FINDINGS

Alignment: Slight thoracic levoscoliosis. No significant listhesis.

Vertebrae: Heterogeneous bone marrow signal throughout the thoracic
spine with patchy areas of enhancement consistent with osseous
metastatic disease. No evidence of epidural tumor.

Cord:  Normal signal.  No abnormal intradural enhancement.

Paraspinal and other soft tissues: Partially visualized known large
right pleural effusion as well as a small left pleural effusion.
Partially visualized known right breast mass.

Disc levels: Mild-to-moderate diffuse thoracic disc degeneration
with disc bulging. No compressive stenosis.

MRI LUMBAR SPINE FINDINGS

Segmentation:  Standard.

Alignment:  Trace retrolisthesis of L2 on L3.

Vertebrae: Heterogeneous bone marrow signal throughout the lumbar
spine and sacrum with patchy areas of enhancement consistent with
osseous metastatic disease. No evidence of epidural tumor.

Conus medullaris: Extends to the L1 level and appears normal.

Paraspinal and other soft tissues: No gross paraspinal mass on this
study which does not include axial images.

Disc levels: Focally advanced disc space narrowing at L2-3 with
degenerative endplate changes. Mild disc bulging at L2-3, L3-4, and
L4-5 without evidence of compressive stenosis.
IMPRESSION: 1. Widespread osseous metastatic disease. No evidence of epidural
tumor.
2. Partial coverage of known right breast mass and large right and
small left pleural effusions.

## 2020-06-25 MED ORDER — GADOBUTROL 1 MMOL/ML IV SOLN
9.0000 mL | Freq: Once | INTRAVENOUS | Status: AC | PRN
  Administered 2020-06-25: 9 mL via INTRAVENOUS

## 2020-06-25 MED ORDER — LACTULOSE 10 GM/15ML PO SOLN
30.0000 g | Freq: Every day | ORAL | Status: DC
  Administered 2020-06-25: 30 g via ORAL
  Filled 2020-06-25 (×2): qty 60

## 2020-06-25 MED ORDER — POLYETHYLENE GLYCOL 3350 17 G PO PACK
17.0000 g | PACK | Freq: Every day | ORAL | Status: DC
  Administered 2020-06-25 – 2020-07-01 (×5): 17 g via ORAL
  Filled 2020-06-25 (×7): qty 1

## 2020-06-25 MED ORDER — PANTOPRAZOLE SODIUM 40 MG PO TBEC
40.0000 mg | DELAYED_RELEASE_TABLET | Freq: Every day | ORAL | Status: DC
  Administered 2020-06-25 – 2020-07-01 (×7): 40 mg via ORAL
  Filled 2020-06-25 (×7): qty 1

## 2020-06-25 NOTE — Progress Notes (Addendum)
   06/24/20 1929  Assess: MEWS Score  Temp 97.7 F (36.5 C)  BP (!) 151/76  Pulse Rate (!) 115  Resp 20  Level of Consciousness Alert  SpO2 96 %  O2 Device HFNC  O2 Flow Rate (L/min) 6 L/min  Assess: MEWS Score  MEWS Temp 0  MEWS Systolic 0  MEWS Pulse 2  MEWS RR 0  MEWS LOC 0  MEWS Score 2  MEWS Score Color Yellow  Assess: if the MEWS score is Yellow or Red  Were vital signs taken at a resting state? Yes  Focused Assessment No change from prior assessment  Early Detection of Sepsis Score *See Row Information* Low  MEWS guidelines implemented *See Row Information* No, previously yellow, continue vital signs every 4 hours  Treat  Pain Scale 0-10  Pain Score 0   No further intervention, previously yellow. Will continue to monitor.

## 2020-06-25 NOTE — Evaluation (Signed)
Physical Therapy Evaluation Patient Details Name: Jessica Willis MRN: [id number] DOB: [birth year] Today's Date: 06/25/2020   History of Present Illness  Jaylyn Iyer is a 77yoF who comes [facility] c SOB and cough. Pt reports swelling of Rt breast for 6 months, noted to have a large breast mass, Rt sided pleural effusion c mass effect. Pt appears to have Right BrCA, admitted no s/p thoracentesis and chest tube placement.  PMH: anemia. Pt required HHHF, moved to bubble HFNC adn out of ICU on 11/12.  Clinical Impression  Pt admitted with above diagnosis. Pt currently with functional limitations due to the deficits listed below (see "PT Problem List"). Upon entry, pt in bed, awake and agreeable to participate. The pt is alert and oriented x4, pleasant, conversational, and generally a good historian. ModAfor bed mobility, modA for STS transfers, once standing able to step to recliner with intermittent minA and due to BLE weakness and buckling. Pt on 6L HFNC upon entry, but drops to 84% at EOB, moved to 8L for rest of session and while left up in recliner with lunch. Author returns later to assist pt back to bed after being weaker and nursing being unable to assist. Pt educated on form for ergonomics in STS transfers, and given modA to perform. Did no attempt RW use this session. Functional mobility assessment demonstrates increased effort/time requirements, poor tolerance, and need for physical assistance, whereas the patient performed these at a higher level of independence PTA. Pt will benefit from skilled PT intervention to increase independence and safety with basic mobility in preparation for discharge to the venue listed below.       Follow Up Recommendations SNF;Supervision/Assistance - 24 hour;Other (comment) (Pt desires to go home; ST Rnot discussed extensively)    Equipment Recommendations  Rolling walker with 5" wheels    Recommendations for Other Services       Precautions /  Restrictions Precautions Precautions: Fall Precaution Comments: restricted limb bilat (BUE) Restrictions Weight Bearing Restrictions: No      Mobility  Bed Mobility Overal bed mobility: Needs Assistance Bed Mobility: Supine to Sit;Sit to Supine     Supine to sit: Mod assist Sit to supine: Min assist   General bed mobility comments: desaturation to 84% at EOB, O2 bumped to 8L    Transfers Overall transfer level: Needs assistance Equipment used: None;1 person hand held assist (try RW next session) Transfers: Sit to/from [facility] Sit to Stand: Mod assist;+2 physical assistance Stand pivot transfers: Min assist       General transfer comment: ModA from low surface, able to take steps to/from recliner with intermittent buckling, author provides guarding knee block as needed  Ambulation/Gait Ambulation/Gait assistance:  (too weak to attempt)              Stairs            Wheelchair Mobility    Modified Rankin (Stroke Patients Only)       Balance Overall balance assessment: Needs assistance Sitting-balance support: Single extremity supported;Feet supported Sitting balance-Leahy Scale: Good     Standing balance support: Bilateral upper extremity supported;During functional activity Standing balance-Leahy Scale: Poor                               Pertinent Vitals/Pain Pain Assessment: No/denies pain    Home Living Family/patient expects to be discharged to:: Private residence Living Arrangements: Spouse/significant other (husband works until 3p) Available  Help at Discharge: Family (2 sons available, DTR; work 2nd shift)   Home Access: Stairs to enter   Technical brewer of Steps: 1 Home Layout: One [place]: None Additional Comments: reports she may have a couple walkers at home.    Prior Function Level of Independence: Independent         Comments: works in Psychologist, prison and probation services   Dominant Hand: Right    Extremity/Trunk Assessment   Upper Extremity Assessment Upper Extremity Assessment: RUE deficits/detail RUE Deficits / Details: total limb edema, but able to use limb for ADL    Lower Extremity Assessment Lower Extremity Assessment: Generalized weakness       Communication      Cognition Arousal/Alertness: Awake/alert Behavior During Therapy: WFL for tasks assessed/performed Overall Cognitive Status: Within Functional Limits for tasks assessed                                        General Comments      Exercises     Assessment/Plan    PT Assessment Patient needs continued PT services  PT Problem List Decreased strength;Decreased activity tolerance;Decreased range of motion;Decreased balance;Decreased mobility;Decreased knowledge of use of DME;Decreased knowledge of precautions;Cardiopulmonary status limiting activity       PT Treatment Interventions DME instruction;Balance training;Gait training;Stair training;Functional mobility training;Therapeutic activities;Therapeutic exercise    PT Goals (Current goals can be found in the Care Plan section)  Acute Rehab PT Goals Patient Stated Goal: regain strength return to home, PT Goal Formulation: With patient Time For Goal Achievement: 07/09/20 Potential to Achieve Goals: Fair    Frequency Min 2X/week   Barriers to discharge        Co-evaluation               AM-PAC PT "6 Clicks" Mobility  Outcome Measure Help needed turning from your back to your side while in a flat bed without using bedrails?: A Lot Help needed moving from lying on your back to sitting on the side of a flat bed without using bedrails?: A Lot Help needed moving to and from a bed to a chair (including a wheelchair)?: A Lot Help needed standing up from a chair using your arms (e.g., wheelchair or bedside chair)?: A Lot Help needed to walk in hospital room?: Total Help needed climbing 3-5  steps with a railing? : Total 6 Click Score: 10    End of Session Equipment Utilized During Treatment: Oxygen Activity Tolerance: Patient limited by fatigue;Patient limited by pain Patient left: in bed;in chair;with nursing/sitter in room;with call bell/phone within reach Nurse Communication: Mobility status PT Visit Diagnosis: Unsteadiness on feet (R26.81);Difficulty in walking, not elsewhere classified (R26.2);Muscle weakness (generalized) (M62.81)    Time: 4010-2725 PT Time Calculation (min) (ACUTE ONLY): 29 min   Charges:   PT Evaluation $PT Eval Moderate Complexity: 1 Mod PT Treatments $Therapeutic Exercise: 8-22 mins        7:05 PM, 06/25/20 Etta Grandchild, PT, DPT Physical Therapist - [facility]  [phone] ([place])    Jama Krichbaum C 06/25/2020, 7:02 PM

## 2020-06-25 NOTE — Plan of Care (Signed)
Continuing with plan of care. 

## 2020-06-25 NOTE — Progress Notes (Signed)
OT Cancellation Note  Patient Details Name: Jessica Willis MRN: [id number] DOB: [birth year]   Cancelled Treatment:    Reason Eval/Treat Not Completed: Fatigue/lethargy limiting ability to participate. OT order received and chart reviewed. Upon arrival pt reports recently returning from MRI and reports too fatigued for mobility / ADL assessment. Pt assisted to reposition RUE on pillows to promote edema mgmt. Will follow up at later date/time and initiate services as able.   Dessie Coma, M.S. OTR/L  06/25/20, 3:59 PM  ascom [phone]

## 2020-06-25 NOTE — Progress Notes (Signed)
Patient ID: Jessica Willis, female   DOB: Oct 08, 1965, 54 y.o.   MRN: [id number] [facility] PROGRESS NOTE  Jessica Willis WUJ:[id number] DOB: [birth year] DOA: 06/15/2020 PCP: System, Provider Not In  HPI/Subjective: Patient complains of a lot of gas and some belching.  Has not had a bowel movement in a while.  Is a little shortness of breath and some cough.  Admitted initially with shortness of breath and cough and found to have metastatic breast cancer.  Objective: Vitals:   06/25/20 0300 06/25/20 1100  BP: (!) 150/75 (!) 156/85  Pulse: (!) 106 (!) 101  Resp: 14 16  Temp: 98.2 F (36.8 C)   SpO2: 94% 99%    Intake/Output Summary (Last 24 hours) at 06/25/2020 1309 Last data filed at 06/25/2020 0504 Gross per 24 hour  Intake 240 ml  Output 2100 ml  Net -1860 ml   Filed Weights   06/15/20 2114  Weight: 86.2 kg    ROS: Review of Systems  Respiratory: Positive for cough and shortness of breath.   Cardiovascular: Negative for chest pain.  Gastrointestinal: Positive for constipation, heartburn and nausea. Negative for abdominal pain and vomiting.   Exam: Physical Exam HENT:     Head: Normocephalic.     Mouth/Throat:     Pharynx: No oropharyngeal exudate.  Eyes:     General: Lids are normal.     Conjunctiva/sclera: Conjunctivae normal.     Pupils: Pupils are equal, round, and reactive to light.  Cardiovascular:     Rate and Rhythm: Regular rhythm. Tachycardia present.     Heart sounds: Normal heart sounds, S1 normal and S2 normal.  Pulmonary:     Breath sounds: Examination of the right-middle field reveals decreased breath sounds and rhonchi. Examination of the right-lower field reveals decreased breath sounds and rhonchi. Examination of the left-lower field reveals decreased breath sounds and rhonchi. Decreased breath sounds and rhonchi present. No wheezing or rales.  Abdominal:     Palpations: Abdomen is soft.     Tenderness: There is no abdominal  tenderness.  Musculoskeletal:     Right ankle: No swelling.     Left ankle: No swelling.  Skin:    General: Skin is warm.     Comments: Metastatic cancer through the upper right chest.  Neurological:     Mental Status: She is alert and oriented to person, place, and time.       Data Reviewed: Basic Metabolic Panel: Recent Labs  Lab 06/20/20 0506 06/21/20 0636 06/22/20 0627 06/23/20 0439 06/24/20 0406  NA 138 138 138 140 140  K 4.3 3.7 3.9 4.0 4.1  CL 101 100 97* 100 98  CO2 28 30 29  32 33*  GLUCOSE 95 100* 170* 147* 135*  BUN 12 9 11 13 13   CREATININE 0.52 0.34* 0.46 0.43* 0.51  CALCIUM 8.5* 8.4* 8.7* 8.6* 8.8*  MG  --   --  2.3 2.4 2.3  PHOS  --   --  3.1 2.7 2.8   CBC: Recent Labs  Lab 06/20/20 0506 06/21/20 0636 06/22/20 0627 06/23/20 0439 06/24/20 0406  WBC 11.7* 9.4 10.0 12.0* 12.7*  NEUTROABS  --   --   --   --  11.0*  HGB 9.7* 9.4* 9.6* 8.9* 8.7*  HCT 31.1* 30.7* 31.1* 28.8* 29.2*  MCV 83.4 82.1 82.9 82.8 84.1  PLT 325 317 378 361 392   BNP (last 3 results) Recent Labs    06/15/20 2125  BNP 49.6  Recent Results (from the past 240 hour(s))  Respiratory Panel by RT PCR (Flu A&B, Covid) - Nasopharyngeal Swab     Status: None   Collection Time: 06/15/20  9:25 PM   Specimen: Nasopharyngeal Swab  Result Value Ref Range Status   SARS Coronavirus 2 by RT PCR NEGATIVE NEGATIVE Final    Comment: (NOTE) SARS-CoV-2 target nucleic acids are NOT DETECTED.  The SARS-CoV-2 RNA is generally detectable in upper respiratoy specimens during the acute phase of infection. The lowest concentration of SARS-CoV-2 viral copies this assay can detect is 131 copies/mL. A negative result does not preclude SARS-Cov-2 infection and should not be used as the sole basis for treatment or other patient management decisions. A negative result may occur with  improper specimen collection/handling, submission of specimen other than nasopharyngeal swab, presence of viral  mutation(s) within the areas targeted by this assay, and inadequate number of viral copies (<131 copies/mL). A negative result must be combined with clinical observations, patient history, and epidemiological information. The expected result is Negative.  Fact Sheet for Patients:  PinkCheek.be  Fact Sheet for Healthcare Providers:  GravelBags.it  This test is no t yet approved or cleared by the [place] FDA and  has been authorized for detection and/or diagnosis of SARS-CoV-2 by FDA under an Emergency Use Authorization (EUA). This EUA will remain  in effect (meaning this test can be used) for the duration of the COVID-19 declaration under Section 564(b)(1) of the Act, 21 U.S.C. section 360bbb-3(b)(1), unless the authorization is terminated or revoked sooner.     Influenza A by PCR NEGATIVE NEGATIVE Final   Influenza B by PCR NEGATIVE NEGATIVE Final    Comment: (NOTE) The Xpert Xpress SARS-CoV-2/FLU/RSV assay is intended as an aid in  the diagnosis of influenza from Nasopharyngeal swab specimens and  should not be used as a sole basis for treatment. Nasal washings and  aspirates are unacceptable for Xpert Xpress SARS-CoV-2/FLU/RSV  testing.  Fact Sheet for Patients: PinkCheek.be  Fact Sheet for Healthcare Providers: GravelBags.it  This test is not yet approved or cleared by the [place] FDA and  has been authorized for detection and/or diagnosis of SARS-CoV-2 by  FDA under an Emergency Use Authorization (EUA). This EUA will remain  in effect (meaning this test can be used) for the duration of the  Covid-19 declaration under Section 564(b)(1) of the Act, 21  U.S.C. section 360bbb-3(b)(1), unless the authorization is  terminated or revoked. Performed at [facility], [address] [place], [place] [address]   Blood Culture (routine x 2)      Status: None   Collection Time: 06/15/20 10:09 PM   Specimen: BLOOD  Result Value Ref Range Status   Specimen Description BLOOD LEFT AC  Final   Special Requests   Final    BOTTLES DRAWN AEROBIC AND ANAEROBIC Blood Culture results may not be optimal due to an inadequate volume of blood received in culture bottles   Culture   Final    NO GROWTH 5 DAYS Performed at [facility], [address], [place], [place] [address]    Report Status 06/20/2020 FINAL  Final  Blood Culture (routine x 2)     Status: None   Collection Time: 06/15/20 10:11 PM   Specimen: BLOOD  Result Value Ref Range Status   Specimen Description BLOOD RIGHT [facility]  Final   Special Requests   Final    BOTTLES DRAWN AEROBIC AND ANAEROBIC Blood Culture adequate volume  Culture   Final    NO GROWTH 5 DAYS Performed at [facility], [address] [place], [place] [address]    Report Status 06/20/2020 FINAL  Final  Urine culture     Status: None   Collection Time: 06/15/20 11:40 PM   Specimen: Urine, Random  Result Value Ref Range Status   Specimen Description   Final    URINE, RANDOM Performed at [facility], [address] [place], [place] [address]    Special Requests   Final    NONE Performed at [facility], [address] [place], [place] [address]    Culture   Final    NO GROWTH Performed at [facility], [place] [address] [place], [place] [address]    Report Status 06/17/2020 FINAL  Final  Pleural Fluid culture (includes gram stain)     Status: None   Collection Time: 06/15/20 11:40 PM   Specimen: Pleural Fluid  Result Value Ref Range Status   Specimen Description   Final    PLEURAL Performed at [facility], [address], [place], [place] [address]    Special Requests   Final    NONE Performed at [facility], [place], [place] [address]    Gram Stain NO WBC SEEN NO ORGANISMS SEEN   Final   Culture   Final     NO GROWTH 3 DAYS Performed at [facility], [place] [address], [place], [place] [address]    Report Status 06/19/2020 FINAL  Final  MRSA PCR Screening     Status: None   Collection Time: 06/16/20 11:51 PM   Specimen: Nasopharyngeal  Result Value Ref Range Status   MRSA by PCR NEGATIVE NEGATIVE Final    Comment:        The GeneXpert MRSA Assay (FDA approved for NASAL specimens only), is one component of a comprehensive MRSA colonization surveillance program. It is not intended to diagnose MRSA infection nor to guide or monitor treatment for MRSA infections. Performed at [facility], [address] [place], [place] [address]   Aerobic/Anaerobic Culture (surgical/deep wound)     Status: None   Collection Time: 06/20/20  3:20 PM   Specimen: Wound  Result Value Ref Range Status   Specimen Description   Final    WOUND Performed at [facility], [address], [place], [place] [address]    Special Requests   Final    RIGHT BREAST Performed at [facility], [address] [place], [place] [address]    Gram Stain   Final    RARE WBC PRESENT,BOTH PMN AND MONONUCLEAR NO ORGANISMS SEEN    Culture   Final    NORMAL SKIN FLORA NO ANAEROBES ISOLATED Performed at [facility], [place] [address] [place], [place] [address]    Report Status 06/25/2020 FINAL  Final     Studies: MR BRAIN W WO CONTRAST  Result Date: 06/24/2020 CLINICAL DATA:  History of metastatic breast cancer.  Staging. EXAM: MRI HEAD WITHOUT AND WITH CONTRAST TECHNIQUE: Multiplanar, multiecho pulse sequences of the brain and surrounding structures were obtained without and with intravenous contrast. CONTRAST:  34mL GADAVIST GADOBUTROL 1 MMOL/ML IV SOLN COMPARISON:  None. FINDINGS: Brain: The brain has a normal appearance without evidence of malformation, atrophy, old or acute small or large vessel infarction, mass lesion, hemorrhage, hydrocephalus or extra-axial collection. After  contrast administration, no enhancement the brain or leptomeninges occurs.  Vascular: Major vessels at the base of the brain show flow. Venous sinuses appear patent. Skull and upper cervical spine: 1 cm metastasis affecting the clivus. Scattered metastases in the upper cervical spine. Sinuses/Orbits: Clear/normal. Other: None significant. IMPRESSION: 1. Normal appearance of the brain itself. No evidence of metastatic disease to the brain or leptomeninges. 2. 1 cm metastasis affecting the clivus. Scattered metastases in the upper cervical spine. Electronically Signed   By: Nelson Chimes M.D.   On: 06/24/2020 11:53   DG Chest Port 1 View  Result Date: 06/23/2020 CLINICAL DATA:  Status post video-assisted thoracoscopy. EXAM: PORTABLE CHEST 1 VIEW COMPARISON:  June 22, 2020. FINDINGS: Stable cardiomegaly central pulmonary vascular congestion is noted. Right-sided chest tube has been removed. No pneumothorax is noted. Stable diffuse right lung opacity is noted concerning for pneumonia with associated pleural effusion, including loculated fluid and right lung apex. Small loculated left pleural effusion is noted laterally. Left perihilar and basilar opacity is noted concerning for infiltrate or possibly edema. Bony thorax is unremarkable. IMPRESSION: Stable diffuse right lung opacity is noted concerning for pneumonia with associated pleural effusion, including loculated fluid and right lung apex. Small loculated left pleural effusion is noted laterally. Left perihilar and basilar opacity is noted concerning for infiltrate or possibly edema. Electronically Signed   By: Marijo Conception M.D.   On: 06/23/2020 14:59    Scheduled Meds: . budesonide (PULMICORT) nebulizer solution  0.25 mg Nebulization BID  . Chlorhexidine Gluconate Cloth  6 each Topical Q0600  . docusate sodium  100 mg Oral BID  . ipratropium-albuterol  3 mL Nebulization BID  . lactulose  30 g Oral Daily  . mouth rinse  15 mL Mouth Rinse BID  .  melatonin  5 mg Oral QHS  . methylPREDNISolone (SOLU-MEDROL) injection  20 mg Intravenous BID  . pantoprazole  40 mg Oral Daily  . polyethylene glycol  17 g Oral Daily  . sodium chloride flush  10-40 mL Intracatheter Q12H   Continuous Infusions: . lactated ringers Stopped (06/23/20 1230)    Assessment/Plan:  1. Acute hypoxic respiratory failure.  Patient is on double high flow nasal cannula 6 L at rest but needed to 8 L with minimal movement with physical therapy today. 2. Stage IV metastatic breast cancer through chest wall on skin, pleural fluid, bone.  MRI of the spine for oncology staging.  Patient on Solu-Medrol.  Oncology and palliative care following.  Overall prognosis is poor.  Case discussed with Dr. Lysle Pearl general surgery that patient will likely need a port for chemotherapy prior to disposition. 3. Large right malignant pleural effusion and hemothorax.  Dr. Genevive Bi was unable to complete VATS procedure but did remove chest tube. 4. Weakness right arm.  This is better as per the patient.  MRI of the brain negative for metastases.  Imaging of the cervical spine today. 5. Belching and GERD we will give Protonix, as needed simethicone. 6. Constipation will give MiraLAX daily and 1 dose of lactulose. 7. Sepsis, present on admission with pneumonia and right chest cellulitis.  Patient completed antibiotics while here. 8. Iron deficiency anemia check hemoglobin again tomorrow 9. Tachycardia.  Continue to monitor. 10. Weakness.  Physical therapy evaluation.        Code Status:     Code Status Orders  (From admission, onward)         Start     Ordered   06/16/20 0010  Full code  Continuous  06/16/20 0010        Code Status History    Date Active Date Inactive Code Status Order ID Comments User Context   06/23/2015 2341 06/30/2015 1944 Full Code [id number]  Lance Coon, MD Inpatient   Advance Care Planning Activity    Advance Directive Documentation     Most Recent  Value  Type of Advance Directive Living will, Healthcare Power of Attorney  Pre-existing out of facility DNR order (yellow form or pink MOST form) --  "MOST" Form in Place? --     Family Communication: Left message for daughter on the phone.  Did speak with patient's daughter on the phone yesterday afternoon and the patient's son on the phone yesterday afternoon. Disposition Plan: Status is: Inpatient  Dispo: The patient is from: Home              Anticipated d/c is to: Home with home health              Anticipated d/c date is: Unable to be determined since still on high flow nasal cannula.              Patient currently still on high flow nasal cannula at this point.  Patient has stage IV metastatic breast cancer and oncology and palliative care following.  Consultants:  Oncology  Palliative care  Cardiothoracic surgery General surgery  Time spent: 27 minutes  [place]

## 2020-06-26 LAB — BASIC METABOLIC PANEL
Anion gap: 10 (ref 5–15)
BUN: 10 mg/dL (ref 6–20)
CO2: 34 mmol/L — ABNORMAL HIGH (ref 22–32)
Calcium: 8.5 mg/dL — ABNORMAL LOW (ref 8.9–10.3)
Chloride: 94 mmol/L — ABNORMAL LOW (ref 98–111)
Creatinine, Ser: 0.45 mg/dL (ref 0.44–1.00)
GFR, Estimated: >60 mL/min (ref >60)
Glucose, Bld: 100 mg/dL — ABNORMAL HIGH (ref 70–99)
Potassium: 4.1 mmol/L (ref 3.5–5.1)
Sodium: 138 mmol/L (ref 135–145)

## 2020-06-26 LAB — CBC
HCT: 31.1 % — ABNORMAL LOW (ref 36.0–46.0)
Hemoglobin: 9.3 g/dL — ABNORMAL LOW (ref 12.0–15.0)
MCH: 25.1 pg — ABNORMAL LOW (ref 26.0–34.0)
MCHC: 29.9 g/dL — ABNORMAL LOW (ref 30.0–36.0)
MCV: 83.8 fL (ref 80.0–100.0)
Platelets: 373 10*3/uL (ref 150–400)
RBC: 3.71 MIL/uL — ABNORMAL LOW (ref 3.87–5.11)
RDW: 12.8 % (ref 11.5–15.5)
WBC: 13.7 10*3/uL — ABNORMAL HIGH (ref 4.0–10.5)
nRBC: 0 % (ref 0.0–0.2)

## 2020-06-26 MED ORDER — LACTULOSE 10 GM/15ML PO SOLN: 30.0000 g | Freq: Every day | ORAL | Status: DC | PRN

## 2020-06-26 NOTE — Progress Notes (Signed)
OT Cancellation Note  Patient Details Name: Jessica Willis MRN: [id number] DOB: [birth year]   Cancelled Treatment:    Reason Eval/Treat Not Completed: Fatigue/lethargy limiting ability to participate. Pt attempted x2 this date. AM pt deferred and PM pt sleeping soundly, not rousing to voice. Will continue to follow and initiate services as pt able.   Dessie Coma, M.S. OTR/L  06/26/20, 3:03 PM  ascom [phone]

## 2020-06-26 NOTE — Progress Notes (Signed)
Patient ID: Jessica Willis, female   DOB: 1965-10-17, 54 y.o.   MRN: [id number] [facility] PROGRESS NOTE  Jessica Willis JEH:[id number] DOB: Apr 20, 1966 DOA: 06/15/2020 PCP: System, Provider Not In  HPI/Subjective: Patient feeling better with regards to her acid reflux.  Also had a bowel movement.  No abdominal pain.  Breathing better.  Change to 4 L regular nasal cannula this morning.  Objective: Vitals:   06/26/20 1233 06/26/20 1430  BP: (!) 150/76   Pulse: (!) 102   Resp: 16   Temp: 97.6 F (36.4 C)   SpO2: 98% 97%    Filed Weights   06/15/20 2114  Weight: 86.2 kg    ROS: Review of Systems  Respiratory: Positive for shortness of breath. Negative for cough.   Cardiovascular: Negative for chest pain.  Gastrointestinal: Negative for abdominal pain, nausea and vomiting.   Exam: Physical Exam HENT:     Head: Normocephalic.     Mouth/Throat:     Pharynx: No oropharyngeal exudate.  Eyes:     General: Lids are normal.     Conjunctiva/sclera: Conjunctivae normal.     Pupils: Pupils are equal, round, and reactive to light.  Cardiovascular:     Rate and Rhythm: Regular rhythm. Tachycardia present.     Heart sounds: Normal heart sounds, S1 normal and S2 normal.  Pulmonary:     Breath sounds: Examination of the right-middle field reveals decreased breath sounds and rhonchi. Examination of the right-lower field reveals decreased breath sounds and rhonchi. Decreased breath sounds and rhonchi present. No wheezing or rales.  Abdominal:     Palpations: Abdomen is soft.     Tenderness: There is no abdominal tenderness.  Musculoskeletal:     Right ankle: No swelling.     Left ankle: No swelling.  Skin:    General: Skin is warm.     Findings: No rash.  Neurological:     Mental Status: She is alert and oriented to person, place, and time.       Data Reviewed: Basic Metabolic Panel: Recent Labs  Lab 06/21/20 0636 06/22/20 0627 06/23/20 0439 06/24/20 0406  06/26/20 0605  NA 138 138 140 140 138  K 3.7 3.9 4.0 4.1 4.1  CL 100 97* 100 98 94*  CO2 30 29 32 33* 34*  GLUCOSE 100* 170* 147* 135* 100*  BUN 9 11 13 13 10   CREATININE 0.34* 0.46 0.43* 0.51 0.45  CALCIUM 8.4* 8.7* 8.6* 8.8* 8.5*  MG  --  2.3 2.4 2.3  --   PHOS  --  3.1 2.7 2.8  --    CBC: Recent Labs  Lab 06/21/20 0636 06/22/20 0627 06/23/20 0439 06/24/20 0406 06/26/20 0605  WBC 9.4 10.0 12.0* 12.7* 13.7*  NEUTROABS  --   --   --  11.0*  --   HGB 9.4* 9.6* 8.9* 8.7* 9.3*  HCT 30.7* 31.1* 28.8* 29.2* 31.1*  MCV 82.1 82.9 82.8 84.1 83.8  PLT 317 378 361 392 373   BNP (last 3 results) Recent Labs    06/15/20 2125  BNP 49.6     Recent Results (from the past 240 hour(s))  MRSA PCR Screening     Status: None   Collection Time: 06/16/20 11:51 PM   Specimen: Nasopharyngeal  Result Value Ref Range Status   MRSA by PCR NEGATIVE NEGATIVE Final    Comment:        The GeneXpert MRSA Assay (FDA approved for NASAL specimens only), is one component of a comprehensive  MRSA colonization surveillance program. It is not intended to diagnose MRSA infection nor to guide or monitor treatment for MRSA infections. Performed at [facility], [address] [place], [place] [address]   Aerobic/Anaerobic Culture (surgical/deep wound)     Status: None   Collection Time: 06/20/20  3:20 PM   Specimen: Wound  Result Value Ref Range Status   Specimen Description   Final    WOUND Performed at [facility], [address] [place], [place] [address]    Special Requests   Final    RIGHT BREAST Performed at [facility], [address] [place], [place] [address]    Gram Stain   Final    RARE WBC PRESENT,BOTH PMN AND MONONUCLEAR NO ORGANISMS SEEN    Culture   Final    NORMAL SKIN FLORA NO ANAEROBES ISOLATED Performed at [facility], [place] [address] [place], [place] [address]    Report Status 06/25/2020 FINAL  Final     Studies: MR  TOTAL SPINE METS SCREENING  Result Date: 06/25/2020 CLINICAL DATA:  Metastatic breast cancer. EXAM: MRI TOTAL SPINE WITHOUT AND WITH CONTRAST TECHNIQUE: Multisequence MR imaging of the spine from the cervical spine to the sacrum was performed prior to and following IV contrast administration for evaluation of spinal metastatic disease. Large field of view sagittal imaging was performed. No axial imaging was obtained. CONTRAST:  86mL GADAVIST GADOBUTROL 1 MMOL/ML IV SOLN COMPARISON:  Head MRI 06/24/2020. Chest CTA 06/15/2020. CT abdomen and pelvis 06/25/2015. FINDINGS: MRI CERVICAL SPINE FINDINGS Alignment: Cervical spine straightening.  No significant listhesis. Vertebrae: Heterogeneous bone marrow signal throughout the cervical spine with patchy areas of enhancement consistent with osseous metastatic disease. No evidence of epidural tumor. Cord: Normal signal.  No abnormal intradural enhancement. Posterior Fossa, vertebral arteries, paraspinal tissues: Posterior fossa more fully evaluated on the recent head MRI. No gross paraspinal mass on this study which does not include axial images. Disc levels: Moderate to severe disc space narrowing from C4-5 to C7-T1 with degenerative endplate changes, disc bulging, and spurring. Assessment for degenerative stenosis is limited in the absence of axial imaging. No compressive spinal stenosis. MRI THORACIC SPINE FINDINGS Alignment: Slight thoracic levoscoliosis. No significant listhesis. Vertebrae: Heterogeneous bone marrow signal throughout the thoracic spine with patchy areas of enhancement consistent with osseous metastatic disease. No evidence of epidural tumor. Cord:  Normal signal.  No abnormal intradural enhancement. Paraspinal and other soft tissues: Partially visualized known large right pleural effusion as well as a small left pleural effusion. Partially visualized known right breast mass. Disc levels: Mild-to-moderate diffuse thoracic disc degeneration with disc  bulging. No compressive stenosis. MRI LUMBAR SPINE FINDINGS Segmentation:  Standard. Alignment:  Trace retrolisthesis of L2 on L3. Vertebrae: Heterogeneous bone marrow signal throughout the lumbar spine and sacrum with patchy areas of enhancement consistent with osseous metastatic disease. No evidence of epidural tumor. Conus medullaris: Extends to the L1 level and appears normal. Paraspinal and other soft tissues: No gross paraspinal mass on this study which does not include axial images. Disc levels: Focally advanced disc space narrowing at L2-3 with degenerative endplate changes. Mild disc bulging at L2-3, L3-4, and L4-5 without evidence of compressive stenosis. IMPRESSION: 1. Widespread osseous metastatic disease. No evidence of epidural tumor. 2. Partial coverage of known right breast mass and large right and small left pleural effusions. Electronically Signed   By: Logan Bores M.D.   On: 06/25/2020 15:12    Scheduled Meds: . budesonide (PULMICORT)  nebulizer solution  0.25 mg Nebulization BID  . docusate sodium  100 mg Oral BID  . ipratropium-albuterol  3 mL Nebulization BID  . mouth rinse  15 mL Mouth Rinse BID  . melatonin  5 mg Oral QHS  . methylPREDNISolone (SOLU-MEDROL) injection  20 mg Intravenous BID  . pantoprazole  40 mg Oral Daily  . polyethylene glycol  17 g Oral Daily  . sodium chloride flush  10-40 mL Intracatheter Q12H   Continuous Infusions: . lactated ringers Stopped (06/23/20 1230)    Assessment/Plan:  1. Acute hypoxic respiratory failure.  For the entire hospital course she was on heated high flow nasal cannula and then tapered over to bubble nasal cannula.  She was changed over to regular nasal cannula 4 L this morning. 2. Stage IV metastatic breast cancer through the chest wall on the skin, pleural fluid and bone.  MRI of the spine did show bony metastases.  Continue Solu-Medrol.  Oncology and palliative care following.  Overall prognosis is poor.  I message Dr. Lysle Pearl  surgery that patient is down to 4 L of oxygen so hopefully they can place a port prior to disposition. 3. Large right malignant pleural effusion and hemothorax.  Dr. Genevive Bi was unable to complete VATS procedure but did remove chest tube. 4. Right arm weakness.  MRI of the brain negative for metastases.  MRI of the spine did show widespread osseous metastatic disease. 5. GERD.  On Protonix and as needed simethicone. 6. Constipation improved with lactulose.  We will continue MiraLAX daily. 7. Sepsis, present on admission with pneumonia and right chest cellulitis.  Patient completed antibiotics while here 8. Iron deficiency anemia.  Hemoglobin 9.3 today. 9. Tachycardia.  Continue to monitor.  We will see what her heart rate does with physical therapy before deciding on any treatment. 10. Weakness.  Physical therapy evaluation recommends rehab versus home with supervision.    Code Status:     Code Status Orders  (From admission, onward)         Start     Ordered   06/16/20 0010  Full code  Continuous        06/16/20 0010        Code Status History    Date Active Date Inactive Code Status Order ID Comments User Context   06/23/2015 2341 06/30/2015 1944 Full Code [id number]  Lance Coon, MD Inpatient   Advance Care Planning Activity    Advance Directive Documentation     Most Recent Value  Type of Advance Directive Living will, Healthcare Power of Attorney  Pre-existing out of facility DNR order (yellow form or pink MOST form) --  "MOST" Form in Place? --     Family Communication: Spoke with daughter on the phone Disposition Plan: Status is: Inpatient  Dispo: The patient is from: Home              Anticipated d/c is to: Rehab versus home with supervision              Anticipated d/c date is: We will need a port placed prior to disposition              Patient currently tapered down to 4 L nasal cannula.  We will watch how she does with this oxygen over the next day or so prior to  disposition.  Consultants:  Cardiothoracic surgery/general surgery  Oncology  Palliative care  Time spent: 30 minutes  Sibley Rolison [employer]

## 2020-06-26 NOTE — Plan of Care (Signed)
Continuing with plan of care. 

## 2020-06-27 ENCOUNTER — Other Ambulatory Visit: Payer: Self-pay | Admitting: Oncology

## 2020-06-27 ENCOUNTER — Inpatient Hospital Stay: Payer: Medicaid Other | Attending: Oncology

## 2020-06-27 ENCOUNTER — Telehealth: Payer: Self-pay

## 2020-06-27 ENCOUNTER — Other Ambulatory Visit: Payer: Self-pay

## 2020-06-27 DIAGNOSIS — C7951 Secondary malignant neoplasm of bone: Secondary | ICD-10-CM | POA: Insufficient documentation

## 2020-06-27 DIAGNOSIS — C50911 Malignant neoplasm of unspecified site of right female breast: Secondary | ICD-10-CM | POA: Insufficient documentation

## 2020-06-27 DIAGNOSIS — Z803 Family history of malignant neoplasm of breast: Secondary | ICD-10-CM | POA: Insufficient documentation

## 2020-06-27 DIAGNOSIS — C50919 Malignant neoplasm of unspecified site of unspecified female breast: Secondary | ICD-10-CM

## 2020-06-27 DIAGNOSIS — Z5111 Encounter for antineoplastic chemotherapy: Secondary | ICD-10-CM | POA: Insufficient documentation

## 2020-06-27 DIAGNOSIS — D509 Iron deficiency anemia, unspecified: Secondary | ICD-10-CM

## 2020-06-27 DIAGNOSIS — J910 Malignant pleural effusion: Secondary | ICD-10-CM | POA: Insufficient documentation

## 2020-06-27 DIAGNOSIS — Z862 Personal history of diseases of the blood and blood-forming organs and certain disorders involving the immune mechanism: Secondary | ICD-10-CM | POA: Insufficient documentation

## 2020-06-27 MED ORDER — SODIUM CHLORIDE 0.9 % IV SOLN
200.0000 mg | INTRAVENOUS | Status: AC
  Filled 2020-06-27 (×2): qty 10

## 2020-06-27 MED ORDER — SODIUM CHLORIDE 0.9 % IV SOLN
200.0000 mg | Freq: Once | INTRAVENOUS | Status: AC
  Administered 2020-06-27: 200 mg via INTRAVENOUS
  Filled 2020-06-27: qty 10

## 2020-06-27 MED ORDER — FOLIC ACID 1 MG PO TABS
1.0000 mg | ORAL_TABLET | Freq: Every day | ORAL | Status: DC
  Administered 2020-06-27 – 2020-07-01 (×5): 1 mg via ORAL
  Filled 2020-06-27 (×5): qty 1

## 2020-06-27 NOTE — H&P (View-Only) (Signed)
Subjective:  CC: Jessica Willis is a 54 y.o. female  Hospital stay day 12,  Metastatic breast cancer  HPI: Acute issues overnight.  Much better  ROS:  General: Denies weight loss, weight gain, fatigue, fevers, chills, and night sweats. Heart: Denies chest pain, palpitations, racing heart, irregular heartbeat, leg pain or swelling, and decreased activity tolerance. Respiratory: Denies breathing difficulty, shortness of breath, wheezing, cough, and sputum. GI: Denies change in appetite, heartburn, nausea, vomiting, constipation, diarrhea, and blood in stool. GU: Denies difficulty urinating, pain with urinating, urgency, frequency, blood in urine.   Objective:   Temp:  [96.8 F (36 C)-98.3 F (36.8 C)] 97.7 F (36.5 C) (11/15 1218) Pulse Rate:  [92-110] 99 (11/15 1218) Resp:  [16-20] 18 (11/15 1218) BP: (138-159)/(72-83) 138/76 (11/15 1218) SpO2:  [99 %-100 %] 100 % (11/15 1218)     Height: 5\' 10"  (177.8 cm) Weight: 86.2 kg BMI (Calculated): 27.26   Intake/Output this shift:   Intake/Output Summary (Last 24 hours) at 06/27/2020 1450 Last data filed at 06/27/2020 0608 Gross per 24 hour  Intake 240 ml  Output 1400 ml  Net -1160 ml    Constitutional :  alert, cooperative, appears stated age and no distress  Respiratory:  clear to auscultation bilaterally  Cardiovascular:  regular rate and rhythm  Gastrointestinal: soft, non-tender; bowel sounds normal; no masses,  no organomegaly.   Skin: Cool and moist.  Right breast cancer unchanged from previous exam.  Chaperone present.  Psychiatric: Normal affect, non-agitated, not confused       LABS:  CMP Latest Ref Rng & Units 06/26/2020 06/24/2020 06/23/2020  Glucose 70 - 99 mg/dL 100(H) 135(H) 147(H)  BUN 6 - 20 mg/dL 10 13 13   Creatinine 0.44 - 1.00 mg/dL 0.45 0.51 0.43(L)  Sodium 135 - 145 mmol/L 138 140 140  Potassium 3.5 - 5.1 mmol/L 4.1 4.1 4.0  Chloride 98 - 111 mmol/L 94(L) 98 100  CO2 22 - 32 mmol/L 34(H) 33(H) 32   Calcium 8.9 - 10.3 mg/dL 8.5(L) 8.8(L) 8.6(L)  Total Protein 6.5 - 8.1 g/dL - - -  Total Bilirubin 0.3 - 1.2 mg/dL - - -  Alkaline Phos 38 - 126 U/L - - -  AST 15 - 41 U/L - - -  ALT 0 - 44 U/L - - -   CBC Latest Ref Rng & Units 06/26/2020 06/24/2020 06/23/2020  WBC 4.0 - 10.5 K/uL 13.7(H) 12.7(H) 12.0(H)  Hemoglobin 12.0 - 15.0 g/dL 9.3(L) 8.7(L) 8.9(L)  Hematocrit 36 - 46 % 31.1(L) 29.2(L) 28.8(L)  Platelets 150 - 400 K/uL 373 392 361    RADS: CLINICAL DATA:  History of metastatic breast cancer.  Staging.  EXAM: MRI HEAD WITHOUT AND WITH CONTRAST  TECHNIQUE: Multiplanar, multiecho pulse sequences of the brain and surrounding structures were obtained without and with intravenous contrast.  CONTRAST:  17mL GADAVIST GADOBUTROL 1 MMOL/ML IV SOLN  COMPARISON:  None.  FINDINGS: Brain: The brain has a normal appearance without evidence of malformation, atrophy, old or acute small or large vessel infarction, mass lesion, hemorrhage, hydrocephalus or extra-axial collection. After contrast administration, no enhancement the brain or leptomeninges occurs.  Vascular: Major vessels at the base of the brain show flow. Venous sinuses appear patent.  Skull and upper cervical spine: 1 cm metastasis affecting the clivus. Scattered metastases in the upper cervical spine.  Sinuses/Orbits: Clear/normal.  Other: None significant.  IMPRESSION: 1. Normal appearance of the brain itself. No evidence of metastatic disease to the brain or leptomeninges. 2.  1 cm metastasis affecting the clivus. Scattered metastases in the upper cervical spine.   Electronically Signed   By: Nelson Chimes M.D.   On: 06/24/2020 11:53 Assessment:   Metastatic breast CA. palliative chemo recommended.  We will schedule for port placement.  Risk alternative benefits discussed.  Risks include bleeding, infection, dislocation, migration, malfunction, unsuccessful placement, pneumothorax, and  additional procedures to address that risks.  Benefits include initiation of chemotherapy.  Alternative includes non-IV infusion therapy.  We discussed the need for the port to infuse IV chemotherapy agents, and how the port can be a temporary and/or permanent option depending on patient preference after completion of chemotherapy.  Port will be okay to use as soon as it is placed.  Patient verbalized understanding and all questions and concerns addressed.

## 2020-06-27 NOTE — Progress Notes (Signed)
Per Dr. Lysle Pearl, pt may be having a port placement later this afternoon. Will keep pt NPO for now.

## 2020-06-27 NOTE — Telephone Encounter (Signed)
Per [place] from Dr. Tasia Catchings: she is going to get first dose on 11/17, so next week chemo please adjust her to 11/24. It wont be new taxol on 11/24 as she is getting firt on ethis wee (inpatient).   Keep lab/MD on 11/22 as scheduled. Only treatment needs to be moved from 1/23 to 11/24.

## 2020-06-27 NOTE — Progress Notes (Signed)
Hematology/Oncology Progress Note [facility] Telephone:([phone] [phone]  Patient Care Team: System, Provider Not In as PCP - General   Name of the patient: Jessica Willis  [id number]  Dec 22, 1965  Date of visit: 06/27/20   INTERVAL HISTORY-  Patient has been transferred out of ICU. Breathing status has improved.  She is now on nasal cannula oxygen 2 L. Denies any neck pain. Appetite is fair.  Review of systems- Review of Systems  Unable to perform ROS: Severe respiratory distress  Constitutional: Positive for fatigue and unexpected weight change. Negative for appetite change.  Eyes: Positive for eye problems.  Respiratory: Positive for shortness of breath. Negative for cough.   Cardiovascular: Negative for leg swelling.  Gastrointestinal: Negative for abdominal pain.  Skin: Negative for rash.  Neurological: Negative for light-headedness.  Psychiatric/Behavioral: Negative for confusion.    No Known Allergies  Patient Active Problem List   Diagnosis Date Noted  . Gastroesophageal reflux disease without esophagitis   . Weakness of right arm   . Palliative care encounter   . Iron deficiency anemia   . Anemia, chronic disease   . Tachycardia   . Metastatic breast cancer ([place])   . Malignant pleural effusion   . Chest tube in place   . Goals of care, counseling/discussion   . Hemothorax on right 06/16/2020  . Acute respiratory failure with hypoxia ([place])   . Breast mass, right 06/15/2020  . Mastitis 06/15/2020  . Pleural effusion 06/15/2020  . Intestinal obstruction ([place])   . Leukocytosis   . Hypokalemia 06/24/2015  . Sepsis ([place]) 06/23/2015  . CAP (community acquired pneumonia) 06/23/2015  . Partial small bowel obstruction ([place]) 06/23/2015     Past Medical History:  Diagnosis Date  . Anemia   . Patient denies medical problems      Past Surgical History:  Procedure Laterality Date  . TUBAL LIGATION    . VIDEO  ASSISTED THORACOSCOPY (VATS)/THOROCOTOMY Right 06/23/2020   Procedure: ATTEMPTED VIDEO ASSISTED THORACOSCOPY (VATS);  Surgeon: Nestor Lewandowsky, MD;  Location: [facility] ORS;  Service: General;  Laterality: Right;    Social History   Socioeconomic History  . Marital status: Single    Spouse name: Not on file  . Number of children: Not on file  . Years of education: Not on file  . Highest education level: Not on file  Occupational History  . Not on file  Tobacco Use  . Smoking status: Never Smoker  . Smokeless tobacco: Never Used  Substance and Sexual Activity  . Alcohol use: Not Currently    Alcohol/week: 1.0 standard drink    Types: 1 Cans of beer per week  . Drug use: No  . Sexual activity: Not on file  Other Topics Concern  . Not on file  Social History Narrative  . Not on file   Social Determinants of Health   Financial Resource Strain:   . Difficulty of Paying Living Expenses: Not on file  Food Insecurity:   . Worried About Charity fundraiser in the Last Year: Not on file  . Ran Out of Food in the Last Year: Not on file  Transportation Needs:   . Lack of Transportation (Medical): Not on file  . Lack of Transportation (Non-Medical): Not on file  Physical Activity:   . Days of Exercise per Week: Not on file  . Minutes of Exercise per Session: Not on file  Stress:   . Feeling of Stress : Not on file  Social Connections:   . Frequency of Communication with Friends and Family: Not on file  . Frequency of Social Gatherings with Friends and Family: Not on file  . Attends Religious Services: Not on file  . Active Member of Clubs or Organizations: Not on file  . Attends Archivist Meetings: Not on file  . Marital Status: Not on file  Intimate Partner Violence:   . Fear of Current or Ex-Partner: Not on file  . Emotionally Abused: Not on file  . Physically Abused: Not on file  . Sexually Abused: Not on file     Family History  Problem Relation Age of Onset  .  Diabetes Other   . Hypertension Other      Current Facility-Administered Medications:  .  acetaminophen (TYLENOL) tablet 650 mg, 650 mg, Oral, Q6H PRN, 650 mg at 06/22/20 1838 **OR** acetaminophen (TYLENOL) suppository 650 mg, 650 mg, Rectal, Q6H PRN, Jonelle Sidle, Mohammad L, MD .  budesonide (PULMICORT) nebulizer solution 0.25 mg, 0.25 mg, Nebulization, BID, Mortimer Fries, Kurian, MD, 0.25 mg at 06/27/20 0836 .  docusate sodium (COLACE) capsule 100 mg, 100 mg, Oral, BID, Rust-Chester, Britton L, NP, 100 mg at 06/27/20 0954 .  ipratropium-albuterol (DUONEB) 0.5-2.5 (3) MG/3ML nebulizer solution 3 mL, 3 mL, Nebulization, BID, Lanney Gins, Fuad, MD, 3 mL at 06/27/20 0836 .  iron sucrose (VENOFER) 200 mg in sodium chloride 0.9 % 100 mL IVPB, 200 mg, Intravenous, Once, Earlie Server, MD .  lactated ringers infusion, , Intravenous, Continuous, Brand Males, MD, Stopped at 06/23/20 1230 .  lactulose (CHRONULAC) 10 GM/15ML solution 30 g, 30 g, Oral, Daily PRN, Wieting, Richard, MD .  LORazepam (ATIVAN) injection 1 mg, 1 mg, Intravenous, Q6H PRN, Darel Hong D, NP, 1 mg at 06/20/20 2131 .  MEDLINE mouth rinse, 15 mL, Mouth Rinse, BID, Awilda Bill, NP, 15 mL at 06/27/20 0954 .  melatonin tablet 5 mg, 5 mg, Oral, QHS, Sharion Settler, NP, 5 mg at 06/26/20 2200 .  methylPREDNISolone sodium succinate (SOLU-MEDROL) 40 mg/mL injection 20 mg, 20 mg, Intravenous, BID, Mortimer Fries, Kurian, MD, 20 mg at 06/27/20 0954 .  metoprolol tartrate (LOPRESSOR) injection 5 mg, 5 mg, Intravenous, Q6H PRN, Sharion Settler, NP, 5 mg at 06/21/20 2049 .  morphine 2 MG/ML injection 1-2 mg, 1-2 mg, Intravenous, Q4H PRN, Darel Hong D, NP, 2 mg at 06/24/20 0910 .  ondansetron (ZOFRAN) tablet 4 mg, 4 mg, Oral, Q6H PRN, 4 mg at 06/25/20 1014 **OR** ondansetron (ZOFRAN) injection 4 mg, 4 mg, Intravenous, Q6H PRN, Gala Romney L, MD, 4 mg at 06/21/20 1116 .  pantoprazole (PROTONIX) EC tablet 40 mg, 40 mg, Oral, Daily, Leslye Peer, Richard, MD,  40 mg at 06/27/20 0954 .  polyethylene glycol (MIRALAX / GLYCOLAX) packet 17 g, 17 g, Oral, Daily, Loletha Grayer, MD, 17 g at 06/27/20 0954 .  simethicone (MYLICON) chewable tablet 80 mg, 80 mg, Oral, QID PRN, Dessa Phi, DO, 80 mg at 06/25/20 1014 .  traMADol (ULTRAM) tablet 50 mg, 50 mg, Oral, Q6H PRN, Dessa Phi, DO, 50 mg at 06/19/20 1401 .  traZODone (DESYREL) tablet 50 mg, 50 mg, Oral, QHS PRN, Darel Hong D, NP, 50 mg at 06/24/20 2046   Physical exam:  Vitals:   06/27/20 0438 06/27/20 0807 06/27/20 0836 06/27/20 1218  BP: (!) 159/80 138/74  138/76  Pulse: 100 (!) 101  99  Resp: _0 Temp: 97.8 F (36.6 C) 97.7 F (36.5 C)  97.7 F (36.5 C)  TempSrc:  Oral Oral  Oral  SpO2: 99% 100% 100% 100%  Weight:      Height:       Physical Exam Constitutional:      General: She is not in acute distress.    Appearance: She is not diaphoretic.  HENT:     Head: Normocephalic and atraumatic.     Nose: Nose normal.     Mouth/Throat:     Pharynx: No oropharyngeal exudate.  Eyes:     General: No scleral icterus.    Pupils: Pupils are equal, round, and reactive to light.  Cardiovascular:     Rate and Rhythm: Normal rate and regular rhythm.     Heart sounds: No murmur heard.   Pulmonary:     Effort: No respiratory distress.     Breath sounds: No rales.     Comments: Decreased breath sound on the right. She breathes comfortably via nasal cannula oxygen Chest:     Chest wall: No tenderness.  Abdominal:     General: There is no distension.     Palpations: Abdomen is soft.     Tenderness: There is no abdominal tenderness.  Musculoskeletal:        General: Normal range of motion.     Cervical back: Normal range of motion and neck supple.  Skin:    General: Skin is warm and dry.     Findings: No erythema.  Neurological:     Mental Status: She is alert and oriented to person, place, and time.     Cranial Nerves: No cranial nerve deficit.     Motor: No abnormal  muscle tone.     Coordination: Coordination normal.  Psychiatric:        Mood and Affect: Affect normal.    right breast mass involving skin      CMP Latest Ref Rng & Units 06/26/2020  Glucose 70 - 99 mg/dL 100(H)  BUN 6 - 20 mg/dL 10  Creatinine 0.44 - 1.00 mg/dL 0.45  Sodium 135 - 145 mmol/L 138  Potassium 3.5 - 5.1 mmol/L 4.1  Chloride 98 - 111 mmol/L 94(L)  CO2 22 - 32 mmol/L 34(H)  Calcium 8.9 - 10.3 mg/dL 8.5(L)  Total Protein 6.5 - 8.1 g/dL -  Total Bilirubin 0.3 - 1.2 mg/dL -  Alkaline Phos 38 - 126 U/L -  AST 15 - 41 U/L -  ALT 0 - 44 U/L -   CBC Latest Ref Rng & Units 06/26/2020  WBC 4.0 - 10.5 K/uL 13.7(H)  Hemoglobin 12.0 - 15.0 g/dL 9.3(L)  Hematocrit 36 - 46 % 31.1(L)  Platelets 150 - 400 K/uL 373    RADIOGRAPHIC STUDIES: I have personally reviewed the radiological images as listed and agreed with the findings in the report. DG Chest 1 View  Result Date: 06/16/2020 CLINICAL DATA:  Hemothorax on the right. EXAM: CHEST  1 VIEW COMPARISON:  Same day chest radiograph. FINDINGS: A right-sided pleural pigtail catheter is redemonstrated. There is interval improvement in aeration of the right lung with decreased size of a right hemothorax. A small right pneumothorax is not significantly changed in size. Severe right atelectasis/airspace disease is redemonstrated. Mild interstitial opacities in the left lung appear unchanged. There is no left pleural effusion or left pneumothorax. The cardiac silhouette is partially obscured but appears unchanged. IMPRESSION: 1. Interval improvement in aeration of the right lung with decreased size of a right hemothorax. 2. Unchanged small right pneumothorax with pleural pigtail catheter in place. 3. Severe right atelectasis/airspace  disease. Electronically Signed   By: Zerita Boers M.D.   On: 06/16/2020 14:52   DG Chest 1 View  Result Date: 06/16/2020 CLINICAL DATA:  Cough and phlegm production. EXAM: CHEST  1 VIEW COMPARISON:  Chest  radiograph dated 06/15/2020. FINDINGS: The cardiac silhouette is obscured. A right-sided pleural pigtail catheter is in unchanged location and there has been interval decrease in a large right pleural fluid collection (effusion versus hemothorax). There is no longer mediastinal shift to the left. There is a small right pneumothorax. Severe airspace and interstitial opacities are seen on the right. Mild interstitial opacities are seen on the left. There is no left pleural effusion or pneumothorax. The osseous structures are intact. IMPRESSION: 1. Small right pneumothorax with a right-sided pleural pigtail catheter in place. 2. Interval decrease in a large right pleural fluid collection (effusion versus hemothorax). 3. Severe airspace and interstitial opacities on the right. Mild interstitial opacities in the left. Electronically Signed   By: Zerita Boers M.D.   On: 06/16/2020 14:28   DG Chest 1 View  Result Date: 06/15/2020 CLINICAL DATA:  Short of breath, cough EXAM: CHEST  1 VIEW COMPARISON:  06/29/2015 FINDINGS: 2 frontal views of the chest demonstrate complete opacification of the right hemithorax, with slight mediastinal shift to the left, consistent with large right pleural effusion and underlying lung consolidation. Chronic areas of scarring are seen within the left chest. No pneumothorax. Cardiac silhouette is unremarkable. No acute bony abnormalities. IMPRESSION: 1. Opacification of the right hemithorax consistent with large right pleural effusion and underlying right lung consolidation. Electronically Signed   By: Randa Ngo M.D.   On: 06/15/2020 21:42   CT Angio Chest PE W and/or Wo Contrast  Result Date: 06/15/2020 CLINICAL DATA:  Cough, shortness of breath EXAM: CT ANGIOGRAPHY CHEST WITH CONTRAST TECHNIQUE: Multidetector CT imaging of the chest was performed using the standard protocol during bolus administration of intravenous contrast. Multiplanar CT image reconstructions and MIPs were  obtained to evaluate the vascular anatomy. CONTRAST:  100 mL Omnipaque 350 IV COMPARISON:  06/30/2015 FINDINGS: Cardiovascular: No filling defects in the pulmonary arteries to suggest pulmonary emboli. Heart is normal size. Aorta normal caliber. Mediastinum/Nodes: No visible mediastinal or hilar adenopathy. Bilateral axillary adenopathy, right greater than left. Index right axillary lymph node has a short axis diameter of 13 mm. Left axillary lymph node has a short axis diameter of 11 mm. Lungs/Pleura: Large right pleural effusion completely compressed seen in the right lung which is not aerated. This appears to have mass effect with shift of the right lung and mediastinal structures to the left. Areas of atelectasis or scarring in the left lung base. No effusion on the left. Upper Abdomen: Imaging into the upper abdomen demonstrates no acute findings. Musculoskeletal: Large mass noted in the right breast measuring 8 x 6 cm. Possible separate right breast mass more inferiorly in the right breast measuring 8 x 4 cm. Skin thickening or edema noted. Associated right axillary adenopathy. A few ill-defined sclerotic areas within the thoracic spine could reflect metastases. Review of the MIP images confirms the above findings. IMPRESSION: No evidence of pulmonary embolus. One, possibly 2 separate large masses within the right breast extending to the skin surface with associated adjacent skin thickening and right axillary adenopathy. Findings compatible with right breast cancer. Large right pleural effusion which appears to have mass effect with complete collapse/compression of the right lung and shift of mediastinal structures to the left. Single enlarged left axillary lymph node. These  results were called by telephone at the time of interpretation on 06/15/2020 at 10:43 pm to provider [facility] , who verbally acknowledged these results. Electronically Signed   By: Rolm Baptise M.D.   On: 06/15/2020 22:46   MR BRAIN  W WO CONTRAST  Result Date: 06/24/2020 CLINICAL DATA:  History of metastatic breast cancer.  Staging. EXAM: MRI HEAD WITHOUT AND WITH CONTRAST TECHNIQUE: Multiplanar, multiecho pulse sequences of the brain and surrounding structures were obtained without and with intravenous contrast. CONTRAST:  9m GADAVIST GADOBUTROL 1 MMOL/ML IV SOLN COMPARISON:  None. FINDINGS: Brain: The brain has a normal appearance without evidence of malformation, atrophy, old or acute small or large vessel infarction, mass lesion, hemorrhage, hydrocephalus or extra-axial collection. After contrast administration, no enhancement the brain or leptomeninges occurs. Vascular: Major vessels at the base of the brain show flow. Venous sinuses appear patent. Skull and upper cervical spine: 1 cm metastasis affecting the clivus. Scattered metastases in the upper cervical spine. Sinuses/Orbits: Clear/normal. Other: None significant. IMPRESSION: 1. Normal appearance of the brain itself. No evidence of metastatic disease to the brain or leptomeninges. 2. 1 cm metastasis affecting the clivus. Scattered metastases in the upper cervical spine. Electronically Signed   By: MNelson ChimesM.D.   On: 06/24/2020 11:53   [place]Venous Img Lower Unilateral Left (DVT)  Result Date: 06/16/2020 CLINICAL DATA:  Initial evaluation for acute left lower extremity edema. EXAM: LEFT LOWER EXTREMITY VENOUS DOPPLER ULTRASOUND TECHNIQUE: Gray-scale sonography with graded compression, as well as color Doppler and duplex ultrasound were performed to evaluate the lower extremity deep venous systems from the level of the common femoral vein and including the common femoral, femoral, profunda femoral, popliteal and calf veins including the posterior tibial, peroneal and gastrocnemius veins when visible. The superficial great saphenous vein was also interrogated. Spectral Doppler was utilized to evaluate flow at rest and with distal augmentation maneuvers in the common femoral,  femoral and popliteal veins. COMPARISON:  None. FINDINGS: Contralateral Common Femoral Vein: Respiratory phasicity is normal and symmetric with the symptomatic side. No evidence of thrombus. Normal compressibility. Common Femoral Vein: No evidence of thrombus. Normal compressibility, respiratory phasicity and response to augmentation. Saphenofemoral Junction: No evidence of thrombus. Normal compressibility and flow on color Doppler imaging. Profunda Femoral Vein: No evidence of thrombus. Normal compressibility and flow on color Doppler imaging. Femoral Vein: No evidence of thrombus. Normal compressibility, respiratory phasicity and response to augmentation. Popliteal Vein: No evidence of thrombus. Normal compressibility, respiratory phasicity and response to augmentation. Calf Veins: No evidence of thrombus. Normal compressibility and flow on color Doppler imaging. Superficial Great Saphenous Vein: No evidence of thrombus. Normal compressibility. Venous Reflux:  None. Other Findings:  None. IMPRESSION: No evidence of deep venous thrombosis. Electronically Signed   By: BJeannine BogaM.D.   On: 06/16/2020 01:30   DG Chest Port 1 View  Result Date: 06/23/2020 CLINICAL DATA:  Status post video-assisted thoracoscopy. EXAM: PORTABLE CHEST 1 VIEW COMPARISON:  June 22, 2020. FINDINGS: Stable cardiomegaly central pulmonary vascular congestion is noted. Right-sided chest tube has been removed. No pneumothorax is noted. Stable diffuse right lung opacity is noted concerning for pneumonia with associated pleural effusion, including loculated fluid and right lung apex. Small loculated left pleural effusion is noted laterally. Left perihilar and basilar opacity is noted concerning for infiltrate or possibly edema. Bony thorax is unremarkable. IMPRESSION: Stable diffuse right lung opacity is noted concerning for pneumonia with associated pleural effusion, including loculated fluid and right lung apex.  Small  loculated left pleural effusion is noted laterally. Left perihilar and basilar opacity is noted concerning for infiltrate or possibly edema. Electronically Signed   By: Marijo Conception M.D.   On: 06/23/2020 14:59   DG Chest Port 1 View  Result Date: 06/22/2020 CLINICAL DATA:  Preoperative chest evaluation EXAM: PORTABLE CHEST 1 VIEW COMPARISON:  06/19/2020 FINDINGS: Chest tube remains in place on the right. Somewhat less pleural density on the right. There may be a small amount of air in the pleural space. Persistent diffuse infiltrate in the right lung. Patchy infiltrate in the mid and lower lung on the left is somewhat improved. IMPRESSION: 1. Chest tube remains in place on the right. Somewhat less pleural fluid. Question small amount of air in the pleural space. 2. Persistent diffuse infiltrate on the right. 3. Improved aeration in the left mid and lower lung. Electronically Signed   By: Nelson Chimes M.D.   On: 06/22/2020 14:43   DG Chest Port 1 View  Result Date: 06/19/2020 CLINICAL DATA:  Chest tube in place EXAM: PORTABLE CHEST 1 VIEW COMPARISON:  Chest radiograph from one day prior. FINDINGS: Right basilar pigtail chest tube is stable. Stable cardiomediastinal silhouette with mild cardiomegaly. No pneumothorax. Near complete opacification of the right hemithorax with minimally improved central right lung aeration. Extensive fluffy left parahilar lung opacities, slightly worsened. Left retrocardiac consolidation is stable. IMPRESSION: 1. Stable right basilar chest tube without pneumothorax. 2. Near complete opacification of the right hemithorax with minimally improved central right lung aeration, which could be due to any combination of pleural effusion, atelectasis, pneumonia, edema or mass. 3. Stable left retrocardiac consolidation, favor atelectasis. 4. Fluffy left parahilar lung opacities, slightly worsened, favor pulmonary edema. Electronically Signed   By: Ilona Sorrel M.D.   On: 06/19/2020  15:04   DG CHEST PORT 1 VIEW  Result Date: 06/18/2020 CLINICAL DATA:  Malignant pleural effusion, RIGHT-side chest tube EXAM: PORTABLE CHEST 1 VIEW COMPARISON:  Portable exam 1042 hours compared to 11 x 21 FINDINGS: Pigtail RIGHT thoracostomy tube again seen. Persistent subtotal opacification of the RIGHT hemithorax by effusion and atelectasis, underlying tumor not excluded. Atelectasis versus consolidation of LEFT lower lobe with probable small pleural effusion. Stable heart size. No acute osseous findings. IMPRESSION: Persistent subtotal opacification of the RIGHT hemithorax by effusion and atelectasis, underlying tumor not excluded. Persistent infiltrate and probable pleural effusion at inferior LEFT chest. Electronically Signed   By: Lavonia Dana M.D.   On: 06/18/2020 16:09   DG Chest Port 1 View  Result Date: 06/17/2020 CLINICAL DATA:  Chest x-ray 06/17/2020 5 a.m. EXAM: PORTABLE CHEST 1 VIEW COMPARISON:  Chest x-ray 06/17/2020 4:10 a.m. FINDINGS: Interval increase in size of a likely large right pleural effusion with slight aeration of the right lung. Likely trace pleural effusion on the left. Interval increase in airspace interstitial opacities of the left lung that is most prominent in the left mid lung zone. Right pigtail chest tube overlies the right hemithorax. IMPRESSION: 1. Slightly improved almost complete whiteout of the right chest with slightly decreased in size large pleural effusion. 2. Interval increase in interstitial and alveolar left lung opacities with possible trace left pleural effusion. Electronically Signed   By: Iven Finn M.D.   On: 06/17/2020 21:30   DG Chest Port 1 View  Result Date: 06/17/2020 CLINICAL DATA:  Pleural effusion EXAM: PORTABLE CHEST 1 VIEW COMPARISON:  Yesterday FINDINGS: More dense and homogeneous right sided opacity. Right pleural catheter in stable position.  Left lower lobe opacity which also appears increased. Likely no cardiomegaly when allowing for  mediastinal shift. IMPRESSION: White out of the right chest with mediastinal mass effect from large pleural effusion. Retrocardiac infiltrate that could be infection or atelectasis. Electronically Signed   By: Monte Fantasia M.D.   On: 06/17/2020 05:00   DG Chest Portable 1 View  Result Date: 06/15/2020 CLINICAL DATA:  Right chest tube placement. EXAM: PORTABLE CHEST 1 VIEW COMPARISON:  Radiograph and CT earlier today. FINDINGS: Placement of right pigtail catheter. There is persistent complete opacification of the right hemithorax. There may be slight diminished mediastinal shift to the left. No visualized pneumothorax. No focal abnormality in the left lung. IMPRESSION: Placement of right pigtail catheter with persistent complete opacification of the right hemithorax. There may be slight diminished mediastinal shift to the left. No visualized pneumothorax. Electronically Signed   By: Keith Rake M.D.   On: 06/15/2020 23:40   MR TOTAL SPINE METS SCREENING  Result Date: 06/25/2020 CLINICAL DATA:  Metastatic breast cancer. EXAM: MRI TOTAL SPINE WITHOUT AND WITH CONTRAST TECHNIQUE: Multisequence MR imaging of the spine from the cervical spine to the sacrum was performed prior to and following IV contrast administration for evaluation of spinal metastatic disease. Large field of view sagittal imaging was performed. No axial imaging was obtained. CONTRAST:  79m GADAVIST GADOBUTROL 1 MMOL/ML IV SOLN COMPARISON:  Head MRI 06/24/2020. Chest CTA 06/15/2020. CT abdomen and pelvis 06/25/2015. FINDINGS: MRI CERVICAL SPINE FINDINGS Alignment: Cervical spine straightening.  No significant listhesis. Vertebrae: Heterogeneous bone marrow signal throughout the cervical spine with patchy areas of enhancement consistent with osseous metastatic disease. No evidence of epidural tumor. Cord: Normal signal.  No abnormal intradural enhancement. Posterior Fossa, vertebral arteries, paraspinal tissues: Posterior fossa more  fully evaluated on the recent head MRI. No gross paraspinal mass on this study which does not include axial images. Disc levels: Moderate to severe disc space narrowing from C4-5 to C7-T1 with degenerative endplate changes, disc bulging, and spurring. Assessment for degenerative stenosis is limited in the absence of axial imaging. No compressive spinal stenosis. MRI THORACIC SPINE FINDINGS Alignment: Slight thoracic levoscoliosis. No significant listhesis. Vertebrae: Heterogeneous bone marrow signal throughout the thoracic spine with patchy areas of enhancement consistent with osseous metastatic disease. No evidence of epidural tumor. Cord:  Normal signal.  No abnormal intradural enhancement. Paraspinal and other soft tissues: Partially visualized known large right pleural effusion as well as a small left pleural effusion. Partially visualized known right breast mass. Disc levels: Mild-to-moderate diffuse thoracic disc degeneration with disc bulging. No compressive stenosis. MRI LUMBAR SPINE FINDINGS Segmentation:  Standard. Alignment:  Trace retrolisthesis of L2 on L3. Vertebrae: Heterogeneous bone marrow signal throughout the lumbar spine and sacrum with patchy areas of enhancement consistent with osseous metastatic disease. No evidence of epidural tumor. Conus medullaris: Extends to the L1 level and appears normal. Paraspinal and other soft tissues: No gross paraspinal mass on this study which does not include axial images. Disc levels: Focally advanced disc space narrowing at L2-3 with degenerative endplate changes. Mild disc bulging at L2-3, L3-4, and L4-5 without evidence of compressive stenosis. IMPRESSION: 1. Widespread osseous metastatic disease. No evidence of epidural tumor. 2. Partial coverage of known right breast mass and large right and small left pleural effusions. Electronically Signed   By: ALogan BoresM.D.   On: 06/25/2020 15:12    Assessment and plan-    #Acute hypoxic respiratory failure,  secondary to right pleural effusion and complete  collapse of right lung. Status post thoracentesis and chest tube insertion.  Attempted VATS procedure with talc pleurodesis on 06/23/2020 but was not successful.  Chest tube was removed. Breathing status has improved.  Continue nasal cannula oxygen.  #Metastatic breast cancer, stage IV. Triple negative. Discussed with patient about systemic chemotherapy plus minus immunotherapy.  Patient agrees with Mediport placement.  Patient will need genetic testing outpatient. Discussed with pathology Dr. Ronnald Ramp.  Patient cytology tissue is not enough for NGS and may not be enough for PD-L1 staining today. Dr.Sakai Broder right breast mass biopsy along with the port placement tomorrow Plan to give first dose of Taxol 80 mg/m.2 during current admission due to her extensive tumor burden, upcoming holidays.   The goal of treatment which is to palliate disease, disease related symptoms, improve quality of life and hopefully prolong life was highlighted in our discussion.  Chemotherapy education was provided.    I explained to the patient the risks and benefits of chemotherapy including all but not limited to hair loss, mouth sore, nausea, vomiting, diarrhea, decrease immune system, low blood counts, bleeding, neuropathy and increased risk of life threatening infection and even death, secondary malignancy etc.  Patient voices understanding and willing to proceed chemotherapy.  # Chemotherapy education; Medi- port placement  #Bone metastasis, currently she denies any bone pain.  Bone scan.  She will also need Zometa treatment outpatient. #Anemia, hemoglobin 9.3, stable.  Iron panel showed decreased iron saturation, decreased reticulocyte hemoglobin. This is consistent with iron deficiency.  Plan IV iron with Venofer 267m x 2 doses. Allergy reactions/infusion reaction including anaphylactic reaction discussed with patient. Other side effects include but not limited to  high blood pressure, skin rash, weight gain, leg swelling, etc. Patient voices understanding and willing to proceed.  Plan was updated via secure chat to Dr. WLeslye Peerthank you for allowing me to participate in the care of this patient.   ZEarlie Server MD, PhD Hematology Oncology CUnited [facility] Campusat AMooresville Endoscopy Center LLCPager- [phone]/15/2021

## 2020-06-27 NOTE — Progress Notes (Signed)
Patient ID: Jessica Willis, female   DOB: [birth year], 54 y.o.   MRN: [id number] [facility] PROGRESS NOTE  Chelci Wintermute GXQ:[id number] DOB: 1965/12/31 DOA: 06/15/2020 PCP: System, Provider Not In  HPI/Subjective: Patient was seen while the nursing staff was changing her dressing on her upper right chest wall.  Patient states that her breathing is better.  Just turned down to 2 L of oxygen.  Patient with some cough.  Eating okay.  No more acid reflux.  Had a bowel movement yesterday.  Admitted with shortness of breath and found to have metastatic breast cancer.  Objective: Vitals:   06/27/20 0836 06/27/20 1218  BP:  138/76  Pulse:  99  Resp:  18  Temp:  97.7 F (36.5 C)  SpO2: 100% 100%    Intake/Output Summary (Last 24 hours) at 06/27/2020 1338 Last data filed at 06/27/2020 1448 Gross per 24 hour  Intake 240 ml  Output 1400 ml  Net -1160 ml   Filed Weights   06/15/20 2114  Weight: 86.2 kg    ROS: Review of Systems  Respiratory: Positive for cough and shortness of breath.   Cardiovascular: Negative for chest pain.  Gastrointestinal: Negative for abdominal pain, nausea and vomiting.   Exam: Physical Exam HENT:     Head: Normocephalic.     Mouth/Throat:     Pharynx: No oropharyngeal exudate.  Eyes:     General: Lids are normal.     Conjunctiva/sclera: Conjunctivae normal.     Pupils: Pupils are equal, round, and reactive to light.  Cardiovascular:     Rate and Rhythm: Regular rhythm. Tachycardia present.     Heart sounds: Normal heart sounds, S1 normal and S2 normal.  Pulmonary:     Breath sounds: Examination of the right-middle field reveals decreased breath sounds and rhonchi. Examination of the right-lower field reveals decreased breath sounds and rhonchi. Decreased breath sounds and rhonchi present. No wheezing or rales.  Abdominal:     Palpations: Abdomen is soft.     Tenderness: There is no abdominal tenderness.  Musculoskeletal:     Right  lower leg: No swelling.     Left lower leg: No swelling.  Skin:    General: Skin is warm.     Findings: No rash.  Neurological:     Mental Status: She is alert and oriented to person, place, and time.       Data Reviewed: Basic Metabolic Panel: Recent Labs  Lab 06/21/20 0636 06/22/20 0627 06/23/20 0439 06/24/20 0406 06/26/20 0605  NA 138 138 140 140 138  K 3.7 3.9 4.0 4.1 4.1  CL 100 97* 100 98 94*  CO2 30 29 32 33* 34*  GLUCOSE 100* 170* 147* 135* 100*  BUN 9 11 13 13 10   CREATININE 0.34* 0.46 0.43* 0.51 0.45  CALCIUM 8.4* 8.7* 8.6* 8.8* 8.5*  MG  --  2.3 2.4 2.3  --   PHOS  --  3.1 2.7 2.8  --    CBC: Recent Labs  Lab 06/21/20 0636 06/22/20 0627 06/23/20 0439 06/24/20 0406 06/26/20 0605  WBC 9.4 10.0 12.0* 12.7* 13.7*  NEUTROABS  --   --   --  11.0*  --   HGB 9.4* 9.6* 8.9* 8.7* 9.3*  HCT 30.7* 31.1* 28.8* 29.2* 31.1*  MCV 82.1 82.9 82.8 84.1 83.8  PLT 317 378 361 392 373     Recent Results (from the past 240 hour(s))  Aerobic/Anaerobic Culture (surgical/deep wound)     Status: None  Collection Time: 06/20/20  3:20 PM   Specimen: Wound  Result Value Ref Range Status   Specimen Description   Final    WOUND Performed at [facility], [address], [place], [place] [address]    Special Requests   Final    RIGHT BREAST Performed at [facility], [address] [place], [place] [address]    Gram Stain   Final    RARE WBC PRESENT,BOTH PMN AND MONONUCLEAR NO ORGANISMS SEEN    Culture   Final    NORMAL SKIN FLORA NO ANAEROBES ISOLATED Performed at [facility], [address], [place], [place] [address]    Report Status 06/25/2020 FINAL  Final     Studies: MR TOTAL SPINE METS SCREENING  Result Date: 06/25/2020 CLINICAL DATA:  Metastatic breast cancer. EXAM: MRI TOTAL SPINE WITHOUT AND WITH CONTRAST TECHNIQUE: Multisequence MR imaging of the spine from the cervical spine to the sacrum was performed prior to and  following IV contrast administration for evaluation of spinal metastatic disease. Large field of view sagittal imaging was performed. No axial imaging was obtained. CONTRAST:  39mL GADAVIST GADOBUTROL 1 MMOL/ML IV SOLN COMPARISON:  Head MRI 06/24/2020. Chest CTA 06/15/2020. CT abdomen and pelvis 06/25/2015. FINDINGS: MRI CERVICAL SPINE FINDINGS Alignment: Cervical spine straightening.  No significant listhesis. Vertebrae: Heterogeneous bone marrow signal throughout the cervical spine with patchy areas of enhancement consistent with osseous metastatic disease. No evidence of epidural tumor. Cord: Normal signal.  No abnormal intradural enhancement. Posterior Fossa, vertebral arteries, paraspinal tissues: Posterior fossa more fully evaluated on the recent head MRI. No gross paraspinal mass on this study which does not include axial images. Disc levels: Moderate to severe disc space narrowing from C4-5 to C7-T1 with degenerative endplate changes, disc bulging, and spurring. Assessment for degenerative stenosis is limited in the absence of axial imaging. No compressive spinal stenosis. MRI THORACIC SPINE FINDINGS Alignment: Slight thoracic levoscoliosis. No significant listhesis. Vertebrae: Heterogeneous bone marrow signal throughout the thoracic spine with patchy areas of enhancement consistent with osseous metastatic disease. No evidence of epidural tumor. Cord:  Normal signal.  No abnormal intradural enhancement. Paraspinal and other soft tissues: Partially visualized known large right pleural effusion as well as a small left pleural effusion. Partially visualized known right breast mass. Disc levels: Mild-to-moderate diffuse thoracic disc degeneration with disc bulging. No compressive stenosis. MRI LUMBAR SPINE FINDINGS Segmentation:  Standard. Alignment:  Trace retrolisthesis of L2 on L3. Vertebrae: Heterogeneous bone marrow signal throughout the lumbar spine and sacrum with patchy areas of enhancement consistent  with osseous metastatic disease. No evidence of epidural tumor. Conus medullaris: Extends to the L1 level and appears normal. Paraspinal and other soft tissues: No gross paraspinal mass on this study which does not include axial images. Disc levels: Focally advanced disc space narrowing at L2-3 with degenerative endplate changes. Mild disc bulging at L2-3, L3-4, and L4-5 without evidence of compressive stenosis. IMPRESSION: 1. Widespread osseous metastatic disease. No evidence of epidural tumor. 2. Partial coverage of known right breast mass and large right and small left pleural effusions. Electronically Signed   By: Logan Bores M.D.   On: 06/25/2020 15:12    Scheduled Meds: . budesonide (PULMICORT) nebulizer solution  0.25 mg Nebulization BID  . docusate sodium  100 mg Oral BID  . folic acid  1 mg Oral Daily  . ipratropium-albuterol  3 mL Nebulization BID  . mouth rinse  15 mL Mouth Rinse BID  . melatonin  5 mg Oral QHS  . methylPREDNISolone (SOLU-MEDROL) injection  20 mg Intravenous BID  . pantoprazole  40 mg Oral Daily  . polyethylene glycol  17 g Oral Daily   Continuous Infusions: . iron sucrose    . [START ON 06/28/2020] iron sucrose    . lactated ringers Stopped (06/23/20 1230)    Assessment/Plan:  1. Acute hypoxic respiratory failure.  Patient dialed down to 2 L regular nasal cannula.  From the start of the hospital course was on heated high flow nasal cannula high amounts of oxygen and then tapered over the bubble nasal cannula and now over the regular nasal cannula. 2. Stage IV metastatic breast cancer through the skin of chest wall, pleural fluid and bone.  Triple negative as per oncology.  After port placement the patient will receive a first dose of chemotherapy whether this is on Tuesday night or Wednesday morning not sure yet.  Port placement on 06/28/2020. 3. Large right malignant pleural effusion and hemothorax.  Patient had a chest tube.  Dr. Genevive Bi attempted a VATS procedure  on 06/23/2020 but this was unsuccessful.  Chest tube was removed.  Patient still with decreased breath sounds on the right lung. 4. Right arm weakness.  MRI of the brain negative for metastases.  MRI of the spine did show widespread osseous metastatic disease. 5. GERD on Protonix and as needed simethicone 6. Constipation.  Resolved.  Continue MiraLAX. 7. Iron deficiency anemia oncology ordering IV iron 8. Sepsis present on admission with pneumonia and right chest cellulitis.  Patient completed antibiotics while here 9. Tachycardia.  Would like to see how she does with ambulation.  Would rather not treat this if I do not have to. 10. Weakness.  Physical therapy recommends rehab versus home health with supervision.  Transitional care team to look into both options.     Code Status:     Code Status Orders  (From admission, onward)         Start     Ordered   06/16/20 0010  Full code  Continuous        06/16/20 0010        Code Status History    Date Active Date Inactive Code Status Order ID Comments User Context   06/23/2015 2341 06/30/2015 1944 Full Code [id number]  Lance Coon, MD Inpatient   Advance Care Planning Activity    Advance Directive Documentation     Most Recent Value  Type of Advance Directive Living will, Healthcare Power of Attorney  Pre-existing out of facility DNR order (yellow form or pink MOST form) --  "MOST" Form in Place? --     Family Communication: Spoke with the patient's daughter on the phone Disposition Plan: Status is: Inpatient  Dispo: The patient is from: Home              Anticipated d/c is to: Rehab versus home with home health              Anticipated d/c date is: Potentially 06/29/2020              Patient currently will receive a port placement tomorrow and first dose of chemotherapy while here in the hospital.  Transitional care team looking into rehab versus home with home health.  Consultants:  General surgery  Oncology  Time  spent: 27 minutes  [place]

## 2020-06-27 NOTE — Care Management (Signed)
Attempted to meet with patient bat bedside to discuss discharge disposition.  PT currently recommending SNF.  Patient to be set up with palliative chemo.   My first attempt patient was using the restroom.  2nd attempt patient was meeting with chemo coordinator.  TOC to follow up tomorrow

## 2020-06-27 NOTE — Progress Notes (Signed)
Subjective:  CC: Jessica Willis is a 54 y.o. female  Hospital stay day 12,  Metastatic breast cancer  HPI: Acute issues overnight.  Much better  ROS:  General: Denies weight loss, weight gain, fatigue, fevers, chills, and night sweats. Heart: Denies chest pain, palpitations, racing heart, irregular heartbeat, leg pain or swelling, and decreased activity tolerance. Respiratory: Denies breathing difficulty, shortness of breath, wheezing, cough, and sputum. GI: Denies change in appetite, heartburn, nausea, vomiting, constipation, diarrhea, and blood in stool. GU: Denies difficulty urinating, pain with urinating, urgency, frequency, blood in urine.   Objective:   Temp:  [96.8 F (36 C)-98.3 F (36.8 C)] 97.7 F (36.5 C) (11/15 1218) Pulse Rate:  [92-110] 99 (11/15 1218) Resp:  [16-20] 18 (11/15 1218) BP: (138-159)/(72-83) 138/76 (11/15 1218) SpO2:  [99 %-100 %] 100 % (11/15 1218)     Height: 5\' 10"  (177.8 cm) Weight: 86.2 kg BMI (Calculated): 27.26   Intake/Output this shift:   Intake/Output Summary (Last 24 hours) at 06/27/2020 1450 Last data filed at 06/27/2020 0608 Gross per 24 hour  Intake 240 ml  Output 1400 ml  Net -1160 ml    Constitutional :  alert, cooperative, appears stated age and no distress  Respiratory:  clear to auscultation bilaterally  Cardiovascular:  regular rate and rhythm  Gastrointestinal: soft, non-tender; bowel sounds normal; no masses,  no organomegaly.   Skin: Cool and moist.  Right breast cancer unchanged from previous exam.  Chaperone present.  Psychiatric: Normal affect, non-agitated, not confused       LABS:  CMP Latest Ref Rng & Units 06/26/2020 06/24/2020 06/23/2020  Glucose 70 - 99 mg/dL 100(H) 135(H) 147(H)  BUN 6 - 20 mg/dL 10 13 13   Creatinine 0.44 - 1.00 mg/dL 0.45 0.51 0.43(L)  Sodium 135 - 145 mmol/L 138 140 140  Potassium 3.5 - 5.1 mmol/L 4.1 4.1 4.0  Chloride 98 - 111 mmol/L 94(L) 98 100  CO2 22 - 32 mmol/L 34(H) 33(H) 32   Calcium 8.9 - 10.3 mg/dL 8.5(L) 8.8(L) 8.6(L)  Total Protein 6.5 - 8.1 g/dL - - -  Total Bilirubin 0.3 - 1.2 mg/dL - - -  Alkaline Phos 38 - 126 U/L - - -  AST 15 - 41 U/L - - -  ALT 0 - 44 U/L - - -   CBC Latest Ref Rng & Units 06/26/2020 06/24/2020 06/23/2020  WBC 4.0 - 10.5 K/uL 13.7(H) 12.7(H) 12.0(H)  Hemoglobin 12.0 - 15.0 g/dL 9.3(L) 8.7(L) 8.9(L)  Hematocrit 36 - 46 % 31.1(L) 29.2(L) 28.8(L)  Platelets 150 - 400 K/uL 373 392 361    RADS: CLINICAL DATA:  History of metastatic breast cancer.  Staging.  EXAM: MRI HEAD WITHOUT AND WITH CONTRAST  TECHNIQUE: Multiplanar, multiecho pulse sequences of the brain and surrounding structures were obtained without and with intravenous contrast.  CONTRAST:  11mL GADAVIST GADOBUTROL 1 MMOL/ML IV SOLN  COMPARISON:  None.  FINDINGS: Brain: The brain has a normal appearance without evidence of malformation, atrophy, old or acute small or large vessel infarction, mass lesion, hemorrhage, hydrocephalus or extra-axial collection. After contrast administration, no enhancement the brain or leptomeninges occurs.  Vascular: Major vessels at the base of the brain show flow. Venous sinuses appear patent.  Skull and upper cervical spine: 1 cm metastasis affecting the clivus. Scattered metastases in the upper cervical spine.  Sinuses/Orbits: Clear/normal.  Other: None significant.  IMPRESSION: 1. Normal appearance of the brain itself. No evidence of metastatic disease to the brain or leptomeninges. 2.  1 cm metastasis affecting the clivus. Scattered metastases in the upper cervical spine.   Electronically Signed   By: Nelson Chimes M.D.   On: 06/24/2020 11:53 Assessment:   Metastatic breast CA. palliative chemo recommended.  We will schedule for port placement.  Risk alternative benefits discussed.  Risks include bleeding, infection, dislocation, migration, malfunction, unsuccessful placement, pneumothorax, and  additional procedures to address that risks.  Benefits include initiation of chemotherapy.  Alternative includes non-IV infusion therapy.  We discussed the need for the port to infuse IV chemotherapy agents, and how the port can be a temporary and/or permanent option depending on patient preference after completion of chemotherapy.  Port will be okay to use as soon as it is placed.  Patient verbalized understanding and all questions and concerns addressed.

## 2020-06-27 NOTE — Evaluation (Signed)
Occupational Therapy Evaluation Patient Details Name: Jessica Willis MRN: [id number] DOB: [birth year] Today's Date: 06/27/2020    History of Present Illness Jessica Willis is a 25yoF who comes [facility] 11[facility] c SOB and cough. Pt reports swelling of Rt breast for 6 months, noted to have a large breast mass, Rt sided pleural effusion c mass effect. Pt appears to have Right BrCA, admitted no s/p thoracentesis and chest tube placement.  PMH: anemia. Pt required HHHF, moved to bubble HFNC adn out of ICU on 11/12.   Clinical Impression   Jessica Willis was seen for OT evaluation this date. Prior to hospital admission, pt was Independent for mobility and I/ADLs including working. Pt lives c spouse and has children available PRN. Pt presents to acute OT demonstrating impaired ADL performance and functional mobility 2/2 decreased activity tolerance and functional strength/balance deficits. Pt currently requires SUPERVISION for bed mobility. MIN A for SPT bed>chair. SpO2 88% on 2L [place] seated EOB, desat 84% during t/f - resolved 89% c rest. SBA don B socks seated EOB. Pt would benefit from skilled OT to address noted impairments and functional limitations (see below for any additional details) in order to maximize safety and independence while minimizing falls risk and caregiver burden. Upon hospital discharge, recommend STR to maximize pt safety and return to PLOF.     Follow Up Recommendations  SNF (may progress)    Equipment Recommendations  3 in 1 bedside commode    Recommendations for Other Services       Precautions / Restrictions Precautions Precautions: Fall Precaution Comments: restricted limb bilat (BUE) Restrictions Weight Bearing Restrictions: No      Mobility Bed Mobility Overal bed mobility: Needs Assistance Bed Mobility: Supine to Sit     Supine to sit: Supervision          Transfers Overall transfer level: Needs assistance Equipment used: 1 person hand held  assist Transfers: Sit to/from Stand;Stand Pivot Transfers Sit to Stand: Min assist;From elevated surface Stand pivot transfers: Min assist            Balance Overall balance assessment: Needs assistance Sitting-balance support: Single extremity supported;Feet supported Sitting balance-Leahy Scale: Good     Standing balance support: Bilateral upper extremity supported;During functional activity Standing balance-Leahy Scale: Poor          ADL either performed or assessed with clinical judgement   ADL Overall ADL's : Needs assistance/impaired    General ADL Comments: SBA don B socks seated EOB. MIN A + HHA for SPT.                   Pertinent Vitals/Pain Pain Assessment: No/denies pain     Hand Dominance Right   Extremity/Trunk Assessment Upper Extremity Assessment Upper Extremity Assessment: Generalized weakness   Lower Extremity Assessment Lower Extremity Assessment: Generalized weakness       Communication Communication Communication: No difficulties   Cognition Arousal/Alertness: Awake/alert Behavior During Therapy: WFL for tasks assessed/performed Overall Cognitive Status: Within Functional Limits for tasks assessed          General Comments  SpO2 88% on 2L [place] seated EOB, desat 84% during t/f - resolved 89% c rest     Exercises Exercises: Other exercises Other Exercises Other Exercises: Pt educated re: OT role, DME recs, d/c recs, falls preventions, ECS Other Exercises: LBD, self-drinking, sup>sit, sit<>stand, SPT, sitting/standing balance/tolerance   Shoulder Instructions      Home Living Family/patient expects to be discharged to:: Private residence Living Arrangements: Spouse/significant  other Available Help at Discharge: Family (2 sons available, DTR; work second shift)   Home Access: Stairs to enter Technical brewer of Steps: 1   Home Layout: One level               Home Equipment: None          Prior  Functioning/Environment Level of Independence: Independent        Comments: works in Education administrator Problem List: Decreased strength;Decreased activity tolerance;Impaired balance (sitting and/or standing);Decreased knowledge of use of DME or AE      OT Treatment/Interventions: Self-care/ADL training;Therapeutic exercise;Energy conservation;DME and/or AE instruction;Therapeutic activities;Patient/family education;Balance training    OT Goals(Current goals can be found in the care plan section) Acute Rehab OT Goals Patient Stated Goal: regain strength return to home, OT Goal Formulation: With patient Time For Goal Achievement: 07/11/20 Potential to Achieve Goals: Good ADL Goals Pt Will Perform Grooming: standing;Independently Pt Will Perform Lower Body Dressing: Independently;sit to/from stand Pt Will Transfer to Toilet: Independently;ambulating;regular height toilet  OT Frequency: Min 2X/week   Barriers to D/C: Decreased caregiver support             AM-PAC OT "6 Clicks" Daily Activity     Outcome Measure Help from another person eating meals?: None Help from another person taking care of personal grooming?: A Little Help from another person toileting, which includes using toliet, bedpan, or urinal?: A Lot Help from another person bathing (including washing, rinsing, drying)?: A Little Help from another person to put on and taking off regular upper body clothing?: A Little Help from another person to put on and taking off regular lower body clothing?: A Little 6 Click Score: 18   End of Session Equipment Utilized During Treatment: Oxygen Nurse Communication: Mobility status  Activity Tolerance: Patient tolerated treatment well Patient left: in chair;with call bell/phone within reach;with chair alarm set  OT Visit Diagnosis: Other abnormalities of gait and mobility (R26.89)                Time: 6789-3810 OT Time Calculation (min): 23 min Charges:  OT  General Charges $OT Visit: 1 Visit OT Evaluation $OT Eval Moderate Complexity: 1 Mod OT Treatments $Self Care/Home Management : 8-22 mins  Dessie Coma, M.S. OTR/L  06/27/20, 12:43 PM  ascom [phone]

## 2020-06-27 NOTE — Telephone Encounter (Signed)
Message received from Dr. Tasia Catchings:  please look into availability of taxol treatment next Monday or Tuesday.  This is a pt I saw during admission.  Plan to give her a dose of Taxol this week while she is still admitted. Need to line up her follow up lab md Taxol next week. she can see me on 11/22 and do treatment on 11/23.  Patient is scheduled for inpatient chemo on 11/17, lab/MD on 11/22 with tx on 11/23.

## 2020-06-27 NOTE — Telephone Encounter (Signed)
Patient scheduled for taxol on 11/24 @9 :30.

## 2020-06-27 NOTE — Progress Notes (Signed)
PT Cancellation Note  Patient Details Name: Jessica Willis MRN: [id number] DOB: 09-08-1965   Cancelled Treatment:    Reason Eval/Treat Not Completed: Patient at procedure or test/unavailable (Twice attempted PT treatment session, pt unavailable each time. WIll attept again early next day.)   Amaury Kuzel C 06/27/2020, 3:36 PM

## 2020-06-27 NOTE — Consult Note (Addendum)
[place] [facility] Nurse Consult Note: Consult requested for right fungulating breast mass.  Performed remotely after review of progress notes and photos in the EMR.  Topical treatment will not promote healing and goals are directed to absorbing drainage and decreasing discomfort and adherence with dressing changes.    Topical treatment orders provided for bedside nurses to perform as follows: Apply double-folded xeroform gauze to right breast Q day, then cover with ABD pads.  Cut the crotch out of a pair of mesh underwear to create a "tank top" and use this to hold dressings in place, and avoid use of tape. Moisten dressing each time with NS to assist with removal.  Please re-consult if further assistance is needed.  Thank-you,  Julien Girt MSN, [place], [place], [place], [place]

## 2020-06-27 NOTE — Progress Notes (Signed)
START ON PATHWAY REGIMEN - Breast     A cycle is every 28 days (3 weeks on and 1 week off):     Paclitaxel   **Always confirm dose/schedule in your pharmacy ordering system**  Patient Characteristics: Distant Metastases or Locoregional Recurrent Disease - Unresected or Locally Advanced Unresectable Disease Progressing after Neoadjuvant and Local Therapies, HER2 Negative/Unknown/Equivocal, ER Negative/Unknown, Chemotherapy, First Line, ER Negative,  PD-L1 Expression Negative/Unknown Therapeutic Status: Distant Metastases ER Status: Negative (-) HER2 Status: Negative (-) PR Status: Negative (-) Therapy Approach Indicated: Standard Chemotherapy/Endocrine Therapy Line of Therapy: First Line PD-L1 Expression Status: Awaiting Test Results Intent of Therapy: Non-Curative / Palliative Intent, Discussed with Patient

## 2020-06-27 NOTE — Patient Instructions (Signed)
Paclitaxel injection What is this medicine? PACLITAXEL (PAK li TAX el) is a chemotherapy drug. It targets fast dividing cells, like cancer cells, and causes these cells to die. This medicine is used to treat ovarian cancer, breast cancer, lung cancer, Kaposi's sarcoma, and other cancers. This medicine may be used for other purposes; ask your health care provider or pharmacist if you have questions. COMMON BRAND NAME(S): Onxol, Taxol What should I tell my health care provider before I take this medicine? They need to know if you have any of these conditions:  history of irregular heartbeat  liver disease  low blood counts, like low white cell, platelet, or red cell counts  lung or breathing disease, like asthma  tingling of the fingers or toes, or other nerve disorder  an unusual or allergic reaction to paclitaxel, alcohol, polyoxyethylated castor oil, other chemotherapy, other medicines, foods, dyes, or preservatives  pregnant or trying to get pregnant  breast-feeding How should I use this medicine? This drug is given as an infusion into a vein. It is administered in a hospital or clinic by a specially trained health care professional. Talk to your pediatrician regarding the use of this medicine in children. Special care may be needed. Overdosage: If you think you have taken too much of this medicine contact a poison control center or emergency room at once. NOTE: This medicine is only for you. Do not share this medicine with others. What if I miss a dose? It is important not to miss your dose. Call your doctor or health care professional if you are unable to keep an appointment. What may interact with this medicine? Do not take this medicine with any of the following medications:  disulfiram  metronidazole This medicine may also interact with the following medications:  antiviral medicines for hepatitis, HIV or AIDS  certain antibiotics like erythromycin and  clarithromycin  certain medicines for fungal infections like ketoconazole and itraconazole  certain medicines for seizures like carbamazepine, phenobarbital, phenytoin  gemfibrozil  nefazodone  rifampin  St. John's wort This list may not describe all possible interactions. Give your health care provider a list of all the medicines, herbs, non-prescription drugs, or dietary supplements you use. Also tell them if you smoke, drink alcohol, or use illegal drugs. Some items may interact with your medicine. What should I watch for while using this medicine? Your condition will be monitored carefully while you are receiving this medicine. You will need important blood work done while you are taking this medicine. This medicine can cause serious allergic reactions. To reduce your risk you will need to take other medicine(s) before treatment with this medicine. If you experience allergic reactions like skin rash, itching or hives, swelling of the face, lips, or tongue, tell your doctor or health care professional right away. In some cases, you may be given additional medicines to help with side effects. Follow all directions for their use. This drug may make you feel generally unwell. This is not uncommon, as chemotherapy can affect healthy cells as well as cancer cells. Report any side effects. Continue your course of treatment even though you feel ill unless your doctor tells you to stop. Call your doctor or health care professional for advice if you get a fever, chills or sore throat, or other symptoms of a cold or flu. Do not treat yourself. This drug decreases your body's ability to fight infections. Try to avoid being around people who are sick. This medicine may increase your risk to bruise   or bleed. Call your doctor or health care professional if you notice any unusual bleeding. Be careful brushing and flossing your teeth or using a toothpick because you may get an infection or bleed more easily.  If you have any dental work done, tell your dentist you are receiving this medicine. Avoid taking products that contain aspirin, acetaminophen, ibuprofen, naproxen, or ketoprofen unless instructed by your doctor. These medicines may hide a fever. Do not become pregnant while taking this medicine. Women should inform their doctor if they wish to become pregnant or think they might be pregnant. There is a potential for serious side effects to an unborn child. Talk to your health care professional or pharmacist for more information. Do not breast-feed an infant while taking this medicine. Men are advised not to father a child while receiving this medicine. This product may contain alcohol. Ask your pharmacist or healthcare provider if this medicine contains alcohol. Be sure to tell all healthcare providers you are taking this medicine. Certain medicines, like metronidazole and disulfiram, can cause an unpleasant reaction when taken with alcohol. The reaction includes flushing, headache, nausea, vomiting, sweating, and increased thirst. The reaction can last from 30 minutes to several hours. What side effects may I notice from receiving this medicine? Side effects that you should report to your doctor or health care professional as soon as possible:  allergic reactions like skin rash, itching or hives, swelling of the face, lips, or tongue  breathing problems  changes in vision  fast, irregular heartbeat  high or low blood pressure  mouth sores  pain, tingling, numbness in the hands or feet  signs of decreased platelets or bleeding - bruising, pinpoint red spots on the skin, black, tarry stools, blood in the urine  signs of decreased red blood cells - unusually weak or tired, feeling faint or lightheaded, falls  signs of infection - fever or chills, cough, sore throat, pain or difficulty passing urine  signs and symptoms of liver injury like dark yellow or brown urine; general ill feeling or  flu-like symptoms; light-colored stools; loss of appetite; nausea; right upper belly pain; unusually weak or tired; yellowing of the eyes or skin  swelling of the ankles, feet, hands  unusually slow heartbeat Side effects that usually do not require medical attention (report to your doctor or health care professional if they continue or are bothersome):  diarrhea  hair loss  loss of appetite  muscle or joint pain  nausea, vomiting  pain, redness, or irritation at site where injected  tiredness This list may not describe all possible side effects. Call your doctor for medical advice about side effects. You may report side effects to FDA at 1-800-FDA-1088. Where should I keep my medicine? This drug is given in a hospital or clinic and will not be stored at home. NOTE: This sheet is a summary. It may not cover all possible information. If you have questions about this medicine, talk to your doctor, pharmacist, or health care provider.  2020 Elsevier/Gold Standard (2017-04-02 13:14:55)  

## 2020-06-28 ENCOUNTER — Inpatient Hospital Stay: Payer: Medicaid Other | Admitting: Anesthesiology

## 2020-06-28 ENCOUNTER — Encounter: Payer: Self-pay | Admitting: Internal Medicine

## 2020-06-28 ENCOUNTER — Encounter: Payer: Self-pay | Admitting: *Deleted

## 2020-06-28 ENCOUNTER — Inpatient Hospital Stay: Payer: Medicaid Other

## 2020-06-28 ENCOUNTER — Other Ambulatory Visit: Payer: Self-pay | Admitting: Oncology

## 2020-06-28 ENCOUNTER — Encounter
Admission: EM | Disposition: A | Discharge status: Home Health | Payer: Self-pay | Source: Home / Self Care | Attending: Internal Medicine

## 2020-06-28 ENCOUNTER — Ambulatory Visit: Payer: Self-pay | Attending: Oncology | Admitting: *Deleted

## 2020-06-28 DIAGNOSIS — C50919 Malignant neoplasm of unspecified site of unspecified female breast: Secondary | ICD-10-CM

## 2020-06-28 DIAGNOSIS — Z7189 Other specified counseling: Secondary | ICD-10-CM

## 2020-06-28 DIAGNOSIS — D509 Iron deficiency anemia, unspecified: Secondary | ICD-10-CM

## 2020-06-28 DIAGNOSIS — E538 Deficiency of other specified B group vitamins: Secondary | ICD-10-CM

## 2020-06-28 HISTORY — PX: PORTACATH PLACEMENT: SHX2246

## 2020-06-28 HISTORY — PX: BREAST BIOPSY: SHX20

## 2020-06-28 LAB — CREATININE, SERUM
Creatinine, Ser: 0.49 mg/dL (ref 0.44–1.00)
GFR, Estimated: >60 mL/min (ref >60)

## 2020-06-28 LAB — CBC
HCT: 34.1 % — ABNORMAL LOW (ref 36.0–46.0)
Hemoglobin: 10.2 g/dL — ABNORMAL LOW (ref 12.0–15.0)
MCH: 25.2 pg — ABNORMAL LOW (ref 26.0–34.0)
MCHC: 29.9 g/dL — ABNORMAL LOW (ref 30.0–36.0)
MCV: 84.4 fL (ref 80.0–100.0)
Platelets: 389 10*3/uL (ref 150–400)
RBC: 4.04 MIL/uL (ref 3.87–5.11)
RDW: 13.2 % (ref 11.5–15.5)
WBC: 17.1 10*3/uL — ABNORMAL HIGH (ref 4.0–10.5)
nRBC: 0 % (ref 0.0–0.2)

## 2020-06-28 IMAGING — DX DG CHEST 1V
1 series · 1 of 1 positions shown · non-contrast
Comparison: [DATE]

CLINICAL DATA: Status port MediPort catheter placement

EXAM:
CHEST  1 VIEW

[chest ap]
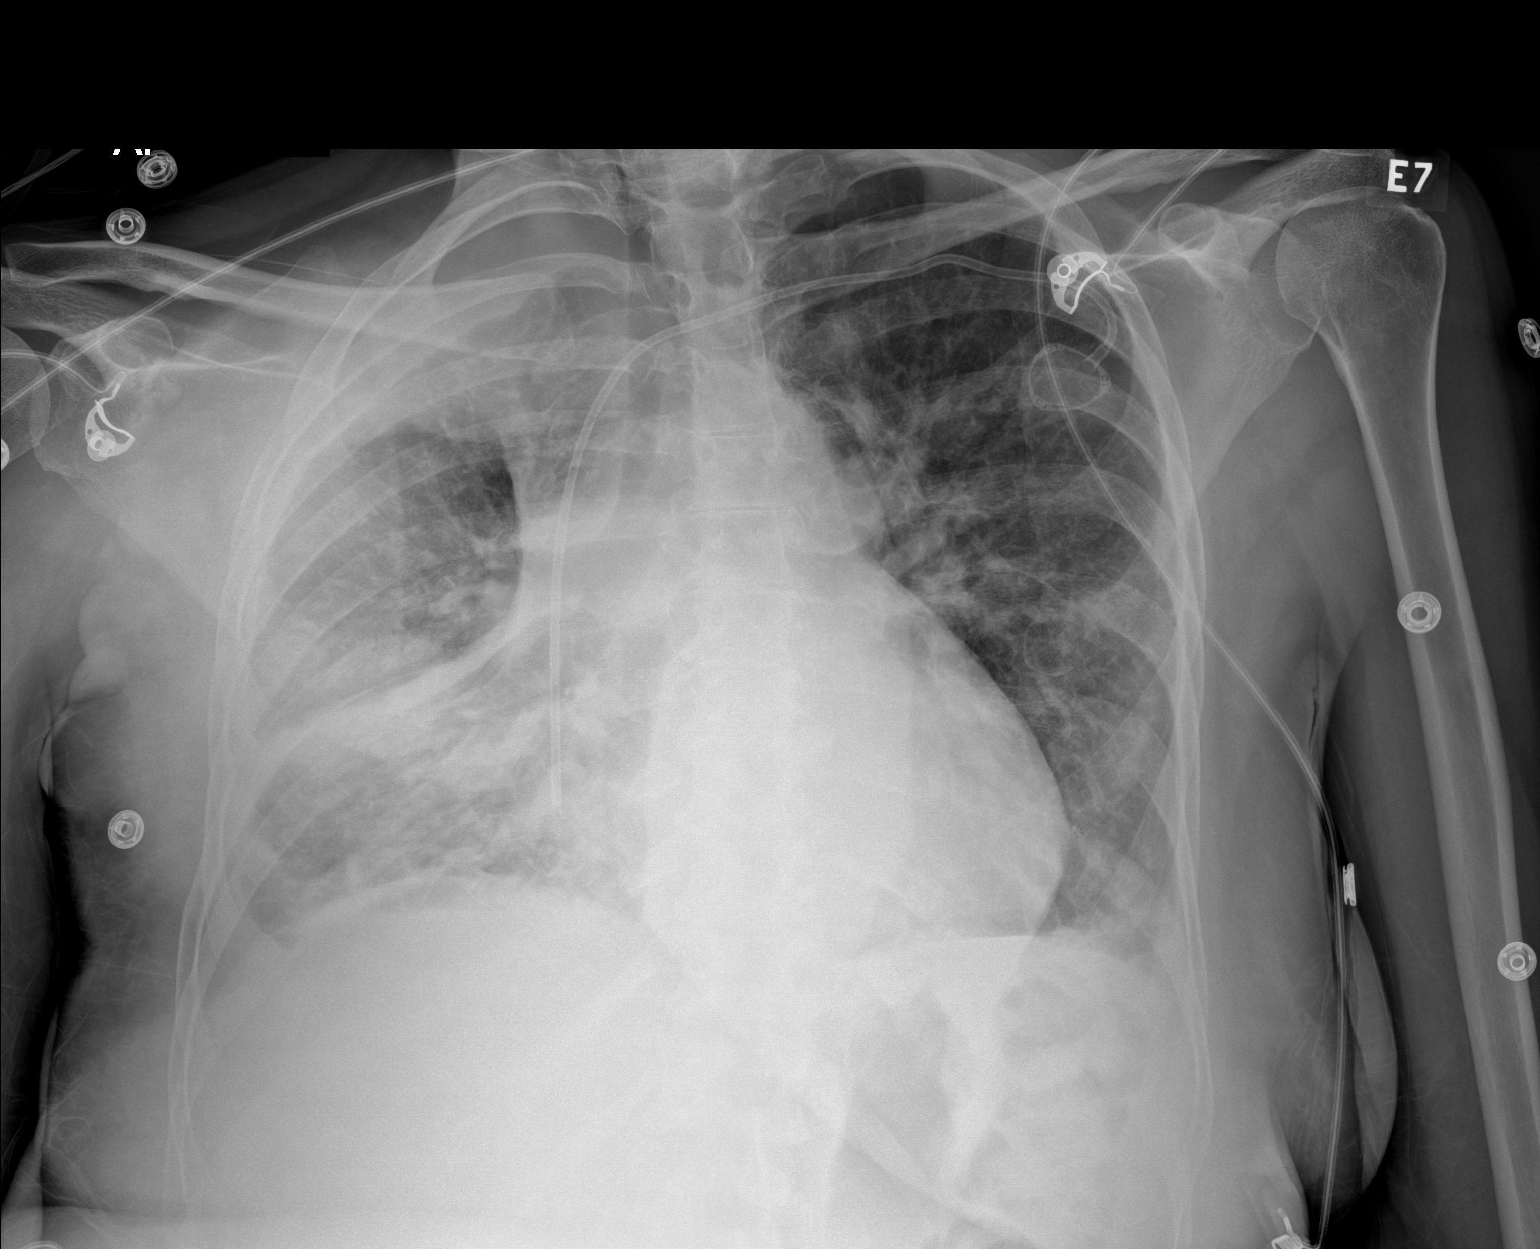

[1 of 1 positions shown; findings below may reference images not displayed]

FINDINGS: The heart size and mediastinal contours are unchanged with mild
cardiomegaly. There is prominence of the central pulmonary
vasculature. Again noted is patchy airspace opacity throughout the
right lung with a probable loculated moderate pleural effusion
unchanged from prior exam. Left perihilar hazy airspace opacity is
noted. A left-sided MediPort catheter seen with the tip at the right
atrium. No acute osseous abnormality.
IMPRESSION: Left MediPort catheter tip just at the right atrium.

No other change in the bilateral perihilar and patchy airspace
opacities

Loculated right pleural effusion

## 2020-06-28 SURGERY — INSERTION, TUNNELED CENTRAL VENOUS DEVICE, WITH PORT
Anesthesia: General

## 2020-06-28 MED ORDER — ADULT MULTIVITAMIN W/MINERALS CH
1.0000 | ORAL_TABLET | Freq: Every day | ORAL | Status: DC
  Administered 2020-06-29 – 2020-07-01 (×3): 1 via ORAL
  Filled 2020-06-28 (×4): qty 1

## 2020-06-28 MED ORDER — PROPOFOL 500 MG/50ML IV EMUL
INTRAVENOUS | Status: AC
  Filled 2020-06-28: qty 50

## 2020-06-28 MED ORDER — ENSURE ENLIVE PO LIQD
237.0000 mL | Freq: Two times a day (BID) | ORAL | Status: DC
  Administered 2020-06-30 – 2020-07-01 (×3): 237 mL via ORAL
  Filled 2020-06-28 (×2): qty 237

## 2020-06-28 MED ORDER — CEFAZOLIN SODIUM-DEXTROSE 1-4 GM/50ML-% IV SOLN
INTRAVENOUS | Status: DC | PRN
  Administered 2020-06-28: 1 g via INTRAVENOUS

## 2020-06-28 MED ORDER — CEFAZOLIN SODIUM 1 G IJ SOLR
INTRAMUSCULAR | Status: AC
  Filled 2020-06-28: qty 10

## 2020-06-28 MED ORDER — FENTANYL CITRATE (PF) 100 MCG/2ML IJ SOLN
INTRAMUSCULAR | Status: DC | PRN
  Administered 2020-06-28 (×2): 50 ug via INTRAVENOUS

## 2020-06-28 MED ORDER — LIDOCAINE HCL (PF) 2 % IJ SOLN
INTRAMUSCULAR | Status: DC | PRN
  Administered 2020-06-28: 50 mg via INTRADERMAL

## 2020-06-28 MED ORDER — ONDANSETRON HCL 4 MG PO TABS: 8.0000 mg | ORAL_TABLET | Freq: Three times a day (TID) | ORAL | Status: DC | PRN

## 2020-06-28 MED ORDER — CHLORHEXIDINE GLUCONATE CLOTH 2 % EX PADS
6.0000 | MEDICATED_PAD | Freq: Every day | CUTANEOUS | Status: DC
  Administered 2020-06-29 – 2020-07-01 (×3): 6 via TOPICAL

## 2020-06-28 MED ORDER — MIDAZOLAM HCL 2 MG/2ML IJ SOLN
INTRAMUSCULAR | Status: AC
  Filled 2020-06-28: qty 2

## 2020-06-28 MED ORDER — LIDOCAINE-EPINEPHRINE (PF) 1 %-1:200000 IJ SOLN
INTRAMUSCULAR | Status: DC | PRN
  Administered 2020-06-28: 8 mL via SUBCUTANEOUS

## 2020-06-28 MED ORDER — HEPARIN SOD (PORK) LOCK FLUSH 100 UNIT/ML IV SOLN
INTRAVENOUS | Status: DC | PRN
  Administered 2020-06-28: 500 [IU] via INTRAVENOUS

## 2020-06-28 MED ORDER — LIDOCAINE HCL (PF) 2 % IJ SOLN
INTRAMUSCULAR | Status: AC
  Filled 2020-06-28: qty 5

## 2020-06-28 MED ORDER — CALCIUM CARBONATE 1250 (500 CA) MG PO TABS
1.0000 | ORAL_TABLET | Freq: Every day | ORAL | Status: DC
  Administered 2020-06-29 – 2020-07-01 (×3): 500 mg via ORAL
  Filled 2020-06-28 (×3): qty 1

## 2020-06-28 MED ORDER — ENOXAPARIN SODIUM 40 MG/0.4ML ~~LOC~~ SOLN
40.0000 mg | SUBCUTANEOUS | Status: DC
  Administered 2020-06-29 – 2020-07-01 (×3): 40 mg via SUBCUTANEOUS
  Filled 2020-06-28 (×3): qty 0.4

## 2020-06-28 MED ORDER — FENTANYL CITRATE (PF) 100 MCG/2ML IJ SOLN
INTRAMUSCULAR | Status: AC
  Filled 2020-06-28: qty 2

## 2020-06-28 MED ORDER — PROPOFOL 500 MG/50ML IV EMUL
INTRAVENOUS | Status: DC | PRN
  Administered 2020-06-28: 88 ug/kg/min via INTRAVENOUS

## 2020-06-28 MED ORDER — VITAMIN D 25 MCG (1000 UNIT) PO TABS
1000.0000 [IU] | ORAL_TABLET | Freq: Every day | ORAL | Status: DC
  Administered 2020-06-29 – 2020-07-01 (×3): 1000 [IU] via ORAL
  Filled 2020-06-28 (×4): qty 1

## 2020-06-28 MED ORDER — MIDAZOLAM HCL 2 MG/2ML IJ SOLN
INTRAMUSCULAR | Status: DC | PRN
  Administered 2020-06-28: 2 mg via INTRAVENOUS

## 2020-06-28 SURGICAL SUPPLY — 38 items
APL PRP STRL LF DISP 70% ISPRP (MISCELLANEOUS) ×4
APL SKNCLS STERI-STRIP NONHPOA (GAUZE/BANDAGES/DRESSINGS) ×2
BAG DECANTER FOR FLEXI CONT (MISCELLANEOUS) ×4 IMPLANT
BENZOIN TINCTURE PRP APPL 2/3 (GAUZE/BANDAGES/DRESSINGS) ×4 IMPLANT
BLADE SURG SZ11 CARB STEEL (BLADE) ×4 IMPLANT
BOOT SUTURE AID YELLOW STND (SUTURE) ×4 IMPLANT
CANISTER SUCT 1200ML W/VALVE (MISCELLANEOUS) ×4 IMPLANT
CHLORAPREP W/TINT 26 (MISCELLANEOUS) ×6 IMPLANT
CLOSURE WOUND 1/2 X4 (GAUZE/BANDAGES/DRESSINGS) ×1
COVER LIGHT HANDLE STERIS (MISCELLANEOUS) ×8 IMPLANT
COVER WAND RF STERILE (DRAPES) ×4 IMPLANT
DECANTER SPIKE VIAL GLASS SM (MISCELLANEOUS) ×2 IMPLANT
DRAPE C-ARM XRAY 36X54 (DRAPES) ×4 IMPLANT
DRSG TEGADERM 4X4.75 (GAUZE/BANDAGES/DRESSINGS) ×4 IMPLANT
ELECT CAUTERY BLADE 6.4 (BLADE) ×4 IMPLANT
ELECT REM PT RETURN 9FT ADLT (ELECTROSURGICAL) ×4
ELECTRODE REM PT RTRN 9FT ADLT (ELECTROSURGICAL) ×2 IMPLANT
GLOVE BIOGEL PI IND STRL 7.0 (GLOVE) ×2 IMPLANT
GLOVE BIOGEL PI INDICATOR 7.0 (GLOVE) ×2
GLOVE SURG SYN 6.5 ES PF (GLOVE) ×4 IMPLANT
GLOVE SURG SYN 6.5 PF PI (GLOVE) ×2 IMPLANT
GOWN STRL REUS W/ TWL LRG LVL3 (GOWN DISPOSABLE) ×4 IMPLANT
GOWN STRL REUS W/TWL LRG LVL3 (GOWN DISPOSABLE) ×8
HEMOSTAT SURGICEL 2X3 (HEMOSTASIS) ×2 IMPLANT
IV NS 500ML (IV SOLUTION) ×4
IV NS 500ML BAXH (IV SOLUTION) ×2 IMPLANT
KIT PORT POWER 8FR ISP CVUE (Port) ×4 IMPLANT
KIT TURNOVER KIT A (KITS) ×4 IMPLANT
LABEL OR SOLS (LABEL) ×4 IMPLANT
MANIFOLD NEPTUNE II (INSTRUMENTS) ×4 IMPLANT
PACK PORT-A-CATH (MISCELLANEOUS) ×4 IMPLANT
SPONGE VERSALON 4X4 4PLY (MISCELLANEOUS) ×4 IMPLANT
STRIP CLOSURE SKIN 1/2X4 (GAUZE/BANDAGES/DRESSINGS) ×3 IMPLANT
SUT MNCRL AB 4-0 PS2 18 (SUTURE) ×4 IMPLANT
SUT PROLENE 2 0 SH DA (SUTURE) ×4 IMPLANT
SUT VIC AB 3-0 SH 27 (SUTURE) ×4
SUT VIC AB 3-0 SH 27X BRD (SUTURE) ×2 IMPLANT
SYR 10ML LL (SYRINGE) ×4 IMPLANT

## 2020-06-28 NOTE — TOC Initial Note (Addendum)
Transition of Care [facility]) - Initial/Assessment Note    Patient Details  Name: Jessica Willis MRN: [id number] Date of Birth: 03/06/1966  Transition of [facility] CM/SW Contact:    Beverly Sessions, RN Phone Number: 06/28/2020, 2:41 PM  Clinical Narrative:                 Patient admitted from home with sepsis Patient states that she lives at home with her significant other.   Patient states that she has 2 sons and 1 daughter that live locally.  Children will be providing transportation to follow up appointments   PCP [place] Denies issues obtaining medications  Patient states that she has a Rollator and BSC in the home Patient currently requiring acute O2  PT has evaluated patient and recommends SNF OT has evaluated patient and recommends home health.   Due to patient being uninsured and new chemo patient would be difficult to place.   Discussed discharge disposition with patient and she wishes to return home at discharge.  Patient is agreeable to home health services.  [place] is on for charity rotation this week, and Corene Cornea with [facility] has accepted referral.  Patient is aware that home health nursing will not be coming out everyday to change dressings, and that her and her family will be responsible for changing dressings on the day home health does not come out.   PT has recommend RW.  Patient declines and wishes to continue using her rollator.   Chemo coordinator has met with patient at bedside. Per patient she was provided additional resources from the cancer center.   Patient agreeable to outpatient palliative.  Referral made to Santiago Glad with [facility] following for additional discharge needs  Expected Discharge Plan: [place] Barriers to Discharge: Continued Medical Work up   Patient Goals and CMS Choice        Expected Discharge Plan and Services Expected  Discharge Plan: [place] In-house Referral: Clinical Social Work Discharge Planning Services: CM Consult   Living arrangements for the past 2 months: [place]: RN, PT, OT [place] Agency: [place] ([facility]) Date [place]: 06/28/20   Representative spoke with at [place]: Corene Cornea  Prior Living Arrangements/Services Living arrangements for the past 2 months: Single Family Home Lives with:: Significant Other Patient language and need for interpreter reviewed:: Yes Do you feel safe going back to the place where you live?: Yes      Need for Family Participation in Patient Care: Yes (Comment) Care giver support system in place?: Yes (comment)   Criminal Activity/Legal Involvement Pertinent to Current Situation/Hospitalization: No - Comment as needed  Activities of Daily Living Home Assistive Devices/Equipment: None ADL Screening (condition at time of admission) Patient's cognitive ability adequate to safely complete daily activities?: Yes Is the patient deaf or have difficulty hearing?: No Does the patient have difficulty seeing, even when wearing glasses/contacts?: No Does the patient have difficulty concentrating, remembering, or making decisions?: No Patient able to express need for assistance with ADLs?: Yes Does the patient have difficulty dressing or bathing?: No Independently performs ADLs?: Yes (appropriate for developmental age) Does the patient have difficulty walking or climbing stairs?: No Weakness of Legs: None Weakness  of Arms/Hands: None  Permission Sought/Granted                  Emotional Assessment Appearance:: Appears stated age     Orientation: : Oriented to Self, Oriented to Place, Oriented to  Time, Oriented to Situation   Psych Involvement: No (comment)  Admission diagnosis:  Edema [R60.9] Hypokalemia [E87.6] Pleural effusion [J90] Hemothorax on left  [J94.2] Hemothorax on right [J94.2] Pleural effusion on right [J90] Breast mass, right [N63.10] Acute respiratory failure with hypoxia ([place]) [J96.01] Sepsis ([place]) [A41.9] Leukocytosis, unspecified type [D72.829] Patient Active Problem List   Diagnosis Date Noted  . Folate deficiency   . Gastroesophageal reflux disease without esophagitis   . Weakness of right arm   . Palliative care encounter   . Iron deficiency anemia   . Anemia, chronic disease   . Tachycardia   . Metastatic breast cancer ([place])   . Malignant pleural effusion   . Chest tube in place   . Goals of care, counseling/discussion   . Hemothorax on right 06/16/2020  . Acute respiratory failure with hypoxia ([place])   . Breast mass, right 06/15/2020  . Mastitis 06/15/2020  . Pleural effusion 06/15/2020  . Intestinal obstruction ([place])   . Leukocytosis   . Hypokalemia 06/24/2015  . Sepsis ([place]) 06/23/2015  . CAP (community acquired pneumonia) 06/23/2015  . Partial small bowel obstruction ([place]) 06/23/2015   PCP:  System, Provider Not In Pharmacy:  No Pharmacies Listed    Social Determinants of Health (SDOH) Interventions    Readmission Risk Interventions No flowsheet data found.

## 2020-06-28 NOTE — Progress Notes (Signed)
Occupational Therapy Treatment Patient Details Name: Jessica Willis MRN: [id number] DOB: Oct 16, 1965 Today's Date: 06/28/2020    History of present illness Lorenna Lurry is a 72yoF who [facility] c SOB and cough. Pt reports swelling of Rt breast for 6 months, noted to have a large breast mass, Rt sided pleural effusion c mass effect. Pt appears to have Right BrCA, admitted no s/p thoracentesis and chest tube placement.  PMH: anemia. Pt required HHHF, moved to bubble HFNC adn out of ICU on 11/12.   OT comments  Ms Rivers seen for OT treatment on this date. Upon arrival to room pt seated in recliner. Reports having near 24/7 assist at home. SBA + RW sit<>stand and ~10 ft in room mobility. SpO2 89% on 3L [place], resolved to 90s c rest on 2L [place]. SUPERVISION seated LBD and grooming tasks. Pt making good progress toward goals. Pt continues to benefit from skilled OT services to maximize return to PLOF and minimize risk of future falls, injury, caregiver burden, and readmission. Will continue to follow POC. Discharge recommendation updated to [facility] c 24/7 assist.    Follow Up Recommendations  Home health OT;Supervision/Assistance - 24 hour    Equipment Recommendations  None recommended by OT    Recommendations for Other Services      Precautions / Restrictions Precautions Precautions: Fall Precaution Comments: restricted limb bilat (BUE) Restrictions Weight Bearing Restrictions: No       Mobility Bed Mobility Overal bed mobility: Needs Assistance Bed Mobility: Supine to Sit     Supine to sit: Supervision     General bed mobility comments: received and left up in chair   Transfers Overall transfer level: Needs assistance Equipment used: Rolling walker (2 wheeled) Transfers: Sit to/from Stand Sit to Stand: Min guard Stand pivot transfers: Min guard       General transfer comment: SBA + RW    Balance Overall balance assessment: Needs  assistance Sitting-balance support: Feet supported Sitting balance-Leahy Scale: Good     Standing balance support: Bilateral upper extremity supported Standing balance-Leahy Scale: Fair Standing balance comment: reliant on UE support      ADL either performed or assessed with clinical judgement   ADL Overall ADL's : Needs assistance/impaired      General ADL Comments: CGA + RW for ADL t/f and ~5 ft inroom mobility. SpO2 89% on 3L [place], resolved to 90s c rest on 2L [place].                Cognition Arousal/Alertness: Awake/alert Behavior During Therapy: WFL for tasks assessed/performed Overall Cognitive Status: Within Functional Limits for tasks assessed         Exercises Exercises: Other exercises Other Exercises Other Exercises: Pt educated re: d/c recs, DME recs, falls prevention, ECS Other Exercises: LBD, sit<>stand, sitting/standing balance/tolerance   Shoulder Instructions       General Comments Desat 89% on 3L , resolved to 90s    Pertinent Vitals/ Pain       Pain Assessment: No/denies pain         Frequency  Min 2X/week        Progress Toward Goals  OT Goals(current goals can now be found in the care plan section)     Acute Rehab OT Goals Patient Stated Goal: regain strength return to home, OT Goal Formulation: With patient Time For Goal Achievement: 07/11/20 Potential to Achieve Goals: Good  Plan Discharge plan needs to be updated;Frequency remains appropriate  AM-PAC OT "6 Clicks" Daily Activity     Outcome Measure   Help from another person eating meals?: None Help from another person taking care of personal grooming?: A Little Help from another person toileting, which includes using toliet, bedpan, or urinal?: A Lot Help from another person bathing (including washing, rinsing, drying)?: A Little Help from another person to put on and taking off regular upper body clothing?: A Little Help from another person to put on and taking off  regular lower body clothing?: A Little 6 Click Score: 18    End of Session Equipment Utilized During Treatment: Oxygen  OT Visit Diagnosis: Other abnormalities of gait and mobility (R26.89)   Activity Tolerance Patient tolerated treatment well   Patient Left in bed;with call bell/phone within reach;with nursing/sitter in room   Nurse Communication Mobility status        Time: 7673-4193 OT Time Calculation (min): 17 min  Charges: OT General Charges $OT Visit: 1 Visit OT Treatments $Self Care/Home Management : 8-22 mins  Dessie Coma, M.S. OTR/L  06/28/20, 1:23 PM  ascom [phone]

## 2020-06-28 NOTE — Interval H&P Note (Signed)
History and Physical Interval Note:  06/28/2020 11:55 AM  Jessica Willis  has presented today for surgery, with the diagnosis of Breast Cancer.  The various methods of treatment have been discussed with the patient and family. After consideration of risks, benefits and other options for treatment, the patient has consented to  Procedure(s): INSERTION PORT-A-CATH (N/A) and right breast mass biopsy as a surgical intervention.  The patient's history has been reviewed, patient examined, no change in status, stable for surgery.  I have reviewed the patient's chart and labs.  Questions were answered to the patient's satisfaction.    ADDING BREAST BIOPSY PER PATH REQUEST TO OBTAIN ADDITIONAL TISSUE FOR FURTHER GENETIC TESTING.  R/B/A explained and patient agreeable   Jessica Willis Lysle Pearl

## 2020-06-28 NOTE — Hospital Course (Signed)
Admitted 06/15/2020 with clinical sepsis and right-sided breast cancer and pleural effusion.  For the patient's acute hypoxic respiratory failure the patient was on heated high flow nasal cannula with high amounts of oxygen during the hospital course and then tapered down to bubble high flow nasal cannula.  Patient has now been on regular nasal cannula for the last few days.  We will get a pulse ox on room air in the a.m. to see if she qualifies for oxygen at home.  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  Metastatic disease through the skin on the right chest, malignant right pleural effusion and bone metastases.  The patient was seen in consultation by Dr. Tasia Catchings oncology.  First chemo will be given on 11/16 in the evening versus 11/17 in the morning.  Patient will have a port placed by Dr. Lysle Pearl general surgery on 11/16 in the afternoon.  He will also get a breast biopsy at the same time.  For the patient's large right malignant pleural effusion and hemothorax.  The patient had a chest tube.  Dr. Genevive Bi attempted a VATS procedure on 06/23/2020 but this was unsuccessful and chest tube was removed at that time.  Right arm weakness.  MRI of the brain was negative for metastases.  MRI of the spine did show widespread osseous metastatic disease.  Sepsis present on admission completely treated with antibiotics.  Right chest cellulitis and pneumonia.

## 2020-06-28 NOTE — Anesthesia Postprocedure Evaluation (Signed)
Anesthesia Post Note  Patient: Jessica Willis  Procedure(s) Performed: INSERTION PORT-A-CATH (N/A ) BREAST BIOPSY  Patient location during evaluation: PACU Anesthesia Type: General Level of consciousness: awake and alert and oriented Pain management: pain level controlled Vital Signs Assessment: post-procedure vital signs reviewed and stable Respiratory status: spontaneous breathing, nonlabored ventilation and respiratory function stable Cardiovascular status: blood pressure returned to baseline and stable Postop Assessment: no signs of nausea or vomiting Anesthetic complications: no   No complications documented.   Last Vitals:  Vitals:   06/28/20 1517 06/28/20 1536  BP: 134/77 (!) 144/70  Pulse: 97 98  Resp: 17 16  Temp:  37.1 C  SpO2: 100% 99%    Last Pain:  Vitals:   06/28/20 1515  TempSrc:   PainSc: 0-No pain                 [name]

## 2020-06-28 NOTE — Transfer of Care (Signed)
Immediate Anesthesia Transfer of Care Note  Patient: Jessica Willis  Procedure(s) Performed: INSERTION PORT-A-CATH (N/A ) BREAST BIOPSY  Patient Location: PACU  Anesthesia Type:MAC  Level of Consciousness: awake  Airway & Oxygen Therapy: Patient Spontanous Breathing and Patient connected to nasal cannula oxygen  Post-op Assessment: Report given to RN and Post -op Vital signs reviewed and stable  Post vital signs: Reviewed and stable  Last Vitals:  Vitals Value Taken Time  BP 140/76 06/28/20 1417  Temp 36.2 C 06/28/20 1417  Pulse 101 06/28/20 1425  Resp 17 06/28/20 1425  SpO2 97 % 06/28/20 1425  Vitals shown include unvalidated device data.  Last Pain:  Vitals:   06/28/20 1417  TempSrc:   PainSc: Asleep      Patients Stated Pain Goal: 0 (37/35/78 9784)  Complications: No complications documented.

## 2020-06-28 NOTE — Progress Notes (Signed)
Physical Therapy Treatment Patient Details Name: Jessica Willis MRN: [id number] DOB: [birth year] Today's Date: 06/28/2020    History of Present Illness Jessica Willis is a 73yoF who comes to [facility] 11/3 c SOB and cough. Pt reports swelling of Rt breast for 6 months, noted to have a large breast mass, Rt sided pleural effusion c mass effect. Pt appears to have Right BrCA, admitted no s/p thoracentesis and chest tube placement.  PMH: anemia. Pt required [facility], moved to [facility] adn out of ICU on 11/12.    PT Comments    Patient alert, agreeable to PT, denied pain. The patient demonstrated great progress towards goals, but still exhibited limited endurance/activity tolerance as well as a decrease in functional mobility compared to baseline. The patient was able to transfer to EOB with supervision, and after a seated rest break transfer to recliner with RW and CGA. Pt instructed in RW positioning and hand placement with good carryover. Once up in the chair she was also able to participate in sitting and standing exercises. Throughout session the pt was on 2L via [place], spO2 monitored throughout session. With exercise/mobility she did desaturate to mid/high 80s noted, recovered within 30-40seconds back to 90% or greater (lowest reading 81% after transferring to recliner). The patient would benefit from further skilled PT intervention to continue to progress towards goals. Recommendation remains appropriate pending further patient progress.      Follow Up Recommendations  SNF;Supervision/Assistance - 24 hour;Other (comment)     Equipment Recommendations  Rolling walker with 5" wheels    Recommendations for Other Services       Precautions / Restrictions Precautions Precautions: Fall Precaution Comments: restricted limb bilat (BUE) Restrictions Weight Bearing Restrictions: No    Mobility  Bed Mobility Overal bed mobility: Needs Assistance Bed Mobility: Supine to Sit     Supine  to sit: Supervision     General bed mobility comments: on 2L throughout, pt asymptomatic spO2 >90%  Transfers Overall transfer level: Needs assistance Equipment used: Rolling walker (2 wheeled) Transfers: Sit to/from Stand Sit to Stand: Min guard Stand pivot transfers: Min guard       General transfer comment: CGA from EOB and from low recliner, cued for hand placement for safety  Ambulation/Gait             General Gait Details: true ambulation deferred, but able to perform standing exercises/marching   Stairs             Wheelchair Mobility    Modified Rankin (Stroke Patients Only)       Balance Overall balance assessment: Needs assistance Sitting-balance support: Feet supported Sitting balance-Leahy Scale: Good     Standing balance support: Bilateral upper extremity supported Standing balance-Leahy Scale: Fair Standing balance comment: reliant on UE support                            Cognition Arousal/Alertness: Awake/alert Behavior During Therapy: WFL for tasks assessed/performed Overall Cognitive Status: Within Functional Limits for tasks assessed                                        Exercises Other Exercises Other Exercises: Pt able to perform seated marching, LAQ, heel/toe raises x10 bilaterally Other Exercises: Pt able to perform standing marching and standing hip abduction bilaterally x10 with RW support and CGA    General  Comments General comments (skin integrity, edema, etc.): Pt on 2L via [place], spO2 monitored throughout session, with mobiltiy desaturation to mid/high 80s noted, recovered within 30-40seconds back to 90% or greater      Pertinent Vitals/Pain Pain Assessment: No/denies pain    Home Living                      Prior Function            PT Goals (current goals can now be found in the care plan section) Progress towards PT goals: Progressing toward goals    Frequency    Min  2X/week      PT Plan Current plan remains appropriate    Co-evaluation              AM-PAC PT "6 Clicks" Mobility   Outcome Measure  Help needed turning from your back to your side while in a flat bed without using bedrails?: A Little Help needed moving from lying on your back to sitting on the side of a flat bed without using bedrails?: A Little Help needed moving to and from a bed to a chair (including a wheelchair)?: A Little Help needed standing up from a chair using your arms (e.g., wheelchair or bedside chair)?: A Little Help needed to walk in hospital room?: A Little Help needed climbing 3-5 steps with a railing? : A Lot 6 Click Score: 17    End of Session Equipment Utilized During Treatment: Oxygen (2L) Activity Tolerance: Patient tolerated treatment well Patient left: in chair;with chair alarm set;with call bell/phone within reach Nurse Communication: Mobility status PT Visit Diagnosis: Unsteadiness on feet (R26.81);Difficulty in walking, not elsewhere classified (R26.2);Muscle weakness (generalized) (M62.81)     Time: 0981-1914 PT Time Calculation (min) (ACUTE ONLY): 32 min  Charges:  $Therapeutic Exercise: 23-37 mins                     Lieutenant Diego PT, DPT 9:45 AM,06/28/20

## 2020-06-28 NOTE — Progress Notes (Signed)
Initial Nutrition Assessment  DOCUMENTATION CODES:   Not applicable  INTERVENTION:   Ensure Enlive po BID, each supplement provides 350 kcal and 20 grams of protein  MVI daily   NUTRITION DIAGNOSIS:   Increased nutrient needs related to cancer and cancer related treatments as evidenced by estimated needs.  GOAL:   Patient will meet greater than or equal to 90% of their needs  MONITOR:   PO intake, Supplement acceptance, Labs, Weight trends, Skin, I & O's  REASON FOR ASSESSMENT:   LOS    ASSESSMENT:   54 y.o. female with history of anemia who was admitted to the hospital on 06/15/2020 with sepsis and acute hypoxic respiratory failure. Pt was found to have Stage IV metastatic breast cancer with large right malignant pleural effusion and hemothorax s/p chest tube placement and unsuccessful VATS 11/11  Unable to see pt today as pt in procedure at time of RD visit. Pt is documented to be eating 100% of meals in hospital. Pt NPO today or scheduled port-a-cath placement and breast biopsy. RD will add supplements and MVI to help pt meet her estimated needs. There is no recent documented weight history to determine is any significant weight changes. RD will obtain nutrition related exam and history at follow-up.   Medications reviewed and include: oscal, D3, colace, folic acid, melatonin, solu-medrol, protonix, miralax, iron sucrose  Labs reviewed: wbc- 13.7(H), Hgb 9.3(L), Hct 31.1(L) Folate- 4.1(L)- 11/12  NUTRITION - FOCUSED PHYSICAL EXAM: Unable to perform at this time   Diet Order:   Diet Order            Diet NPO time specified  Diet effective midnight                EDUCATION NEEDS:   Not appropriate for education at this time  Skin:  Skin Assessment: Reviewed RN Assessment (ecchymosis, incision R breast and chest)  Last BM:  11/14- type 6  Height:   Ht Readings from Last 1 Encounters:  06/28/20 5\' 10"  (1.778 m)    Weight:   Wt Readings from Last 1  Encounters:  06/28/20 86.2 kg    Ideal Body Weight:  68 kg  BMI:  Body mass index is 27.27 kg/m.  Estimated Nutritional Needs:   Kcal:  2000-2300kcal/day  Protein:  100-115g/day  Fluid:  >2L/day  Koleen Distance MS, RD, LDN Please refer to [facility] for RD and/or RD on-call/weekend/after hours pager

## 2020-06-28 NOTE — Progress Notes (Signed)
Hematology/Oncology Progress Note [facility] [phone] [phone]  Patient Care Team: System, Provider Not In as PCP - General Rico Junker, RN as Registered Nurse   Name of the patient: Jessica Willis  [id number]  1965/12/16  Date of visit: 06/28/20   INTERVAL HISTORY-  Patient is on nasal cannula oxygen 2 L.  No new complaints.  She denies any neck pain or other bone pain.  Review of systems- Review of Systems  Constitutional: Positive for fatigue. Negative for appetite change.  Eyes: Positive for eye problems.  Respiratory: Positive for shortness of breath. Negative for cough.   Cardiovascular: Negative for leg swelling.  Gastrointestinal: Negative for abdominal pain.  Genitourinary: Negative for dysuria.   Musculoskeletal: Negative for back pain and neck pain.  Skin: Negative for rash.  Neurological: Negative for light-headedness.  Psychiatric/Behavioral: Negative for confusion.    No Known Allergies  Patient Active Problem List   Diagnosis Date Noted  . Gastroesophageal reflux disease without esophagitis   . Weakness of right arm   . Palliative care encounter   . Iron deficiency anemia   . Anemia, chronic disease   . Tachycardia   . Metastatic breast cancer ([place])   . Malignant pleural effusion   . Chest tube in place   . Goals of care, counseling/discussion   . Hemothorax on right 06/16/2020  . Acute respiratory failure with hypoxia ([place])   . Breast mass, right 06/15/2020  . Mastitis 06/15/2020  . Pleural effusion 06/15/2020  . Intestinal obstruction ([place])   . Leukocytosis   . Hypokalemia 06/24/2015  . Sepsis ([place]) 06/23/2015  . CAP (community acquired pneumonia) 06/23/2015  . Partial small bowel obstruction ([place]) 06/23/2015     Past Medical History:  Diagnosis Date  . Anemia   . Patient denies medical problems      Past Surgical History:  Procedure Laterality Date  . TUBAL LIGATION    .  VIDEO ASSISTED THORACOSCOPY (VATS)/THOROCOTOMY Right 06/23/2020   Procedure: ATTEMPTED VIDEO ASSISTED THORACOSCOPY (VATS);  Surgeon: Nestor Lewandowsky, MD;  Location: [facility] ORS;  Service: General;  Laterality: Right;    Social History   Socioeconomic History  . Marital status: Single    Spouse name: Not on file  . Number of children: Not on file  . Years of education: Not on file  . Highest education level: Not on file  Occupational History  . Not on file  Tobacco Use  . Smoking status: Never Smoker  . Smokeless tobacco: Never Used  Substance and Sexual Activity  . Alcohol use: Not Currently    Alcohol/week: 1.0 standard drink    Types: 1 Cans of beer per week  . Drug use: No  . Sexual activity: Not on file  Other Topics Concern  . Not on file  Social History Narrative  . Not on file   Social Determinants of Health   Financial Resource Strain:   . Difficulty of Paying Living Expenses: Not on file  Food Insecurity:   . Worried About Charity fundraiser in the Last Year: Not on file  . Ran Out of Food in the Last Year: Not on file  Transportation Needs:   . Lack of Transportation (Medical): Not on file  . Lack of Transportation (Non-Medical): Not on file  Physical Activity:   . Days of Exercise per Week: Not on file  . Minutes of Exercise per Session: Not on file  Stress:   . Feeling of Stress :  Not on file  Social Connections:   . Frequency of Communication with Friends and Family: Not on file  . Frequency of Social Gatherings with Friends and Family: Not on file  . Attends Religious Services: Not on file  . Active Member of Clubs or Organizations: Not on file  . Attends Archivist Meetings: Not on file  . Marital Status: Not on file  Intimate Partner Violence:   . Fear of Current or Ex-Partner: Not on file  . Emotionally Abused: Not on file  . Physically Abused: Not on file  . Sexually Abused: Not on file     Family History  Problem Relation Age of Onset   . Diabetes Other   . Hypertension Other      Current Facility-Administered Medications:  .  acetaminophen (TYLENOL) tablet 650 mg, 650 mg, Oral, Q6H PRN, 650 mg at 06/22/20 1838 **OR** acetaminophen (TYLENOL) suppository 650 mg, 650 mg, Rectal, Q6H PRN, Jonelle Sidle, Mohammad L, MD .  budesonide (PULMICORT) nebulizer solution 0.25 mg, 0.25 mg, Nebulization, BID, Kasa, Kurian, MD, 0.25 mg at 06/28/20 0821 .  docusate sodium (COLACE) capsule 100 mg, 100 mg, Oral, BID, Rust-Chester, Britton L, NP, 100 mg at 06/27/20 2249 .  folic acid (FOLVITE) tablet 1 mg, 1 mg, Oral, Daily, Earlie Server, MD, 1 mg at 06/27/20 1605 .  ipratropium-albuterol (DUONEB) 0.5-2.5 (3) MG/3ML nebulizer solution 3 mL, 3 mL, Nebulization, BID, Lanney Gins, Fuad, MD, 3 mL at 06/28/20 0821 .  iron sucrose (VENOFER) 200 mg in sodium chloride 0.9 % 100 mL IVPB, 200 mg, Intravenous, Q24H, Earlie Server, MD .  lactated ringers infusion, , Intravenous, Continuous, Brand Males, MD, Stopped at 06/23/20 1230 .  lactulose (CHRONULAC) 10 GM/15ML solution 30 g, 30 g, Oral, Daily PRN, Wieting, Richard, MD .  LORazepam (ATIVAN) injection 1 mg, 1 mg, Intravenous, Q6H PRN, Darel Hong D, NP, 1 mg at 06/20/20 2131 .  MEDLINE mouth rinse, 15 mL, Mouth Rinse, BID, Blakeney, Dreama Saa, NP, 15 mL at 06/27/20 2249 .  melatonin tablet 5 mg, 5 mg, Oral, QHS, Sharion Settler, NP, 5 mg at 06/27/20 2249 .  methylPREDNISolone sodium succinate (SOLU-MEDROL) 40 mg/mL injection 20 mg, 20 mg, Intravenous, BID, Kasa, Kurian, MD, 20 mg at 06/27/20 2249 .  metoprolol tartrate (LOPRESSOR) injection 5 mg, 5 mg, Intravenous, Q6H PRN, Sharion Settler, NP, 5 mg at 06/21/20 2049 .  morphine 2 MG/ML injection 1-2 mg, 1-2 mg, Intravenous, Q4H PRN, Darel Hong D, NP, 2 mg at 06/24/20 0910 .  ondansetron (ZOFRAN) tablet 4 mg, 4 mg, Oral, Q6H PRN, 4 mg at 06/25/20 1014 **OR** ondansetron (ZOFRAN) injection 4 mg, 4 mg, Intravenous, Q6H PRN, Gala Romney L, MD, 4 mg at  06/21/20 1116 .  pantoprazole (PROTONIX) EC tablet 40 mg, 40 mg, Oral, Daily, Leslye Peer, Richard, MD, 40 mg at 06/27/20 0954 .  polyethylene glycol (MIRALAX / GLYCOLAX) packet 17 g, 17 g, Oral, Daily, Loletha Grayer, MD, 17 g at 06/27/20 0954 .  simethicone (MYLICON) chewable tablet 80 mg, 80 mg, Oral, QID PRN, Dessa Phi, DO, 80 mg at 06/25/20 1014 .  traMADol (ULTRAM) tablet 50 mg, 50 mg, Oral, Q6H PRN, Dessa Phi, DO, 50 mg at 06/19/20 1401 .  traZODone (DESYREL) tablet 50 mg, 50 mg, Oral, QHS PRN, Darel Hong D, NP, 50 mg at 06/24/20 2046   Physical exam:  Vitals:   06/28/20 0525 06/28/20 0812 06/28/20 0821 06/28/20 1143  BP: (!) 143/72 132/71  (!) 153/87  Pulse: 79 93  96  Resp: _0 Temp: 98.5 F (36.9 C) 98 F (36.7 C)  98.2 F (36.8 C)  TempSrc: Oral   Oral  SpO2: 96% 98% 98% 99%  Weight:      Height:       Physical Exam Constitutional:      General: She is not in acute distress.    Appearance: She is not diaphoretic.  HENT:     Head: Normocephalic and atraumatic.     Nose: Nose normal.     Mouth/Throat:     Pharynx: No oropharyngeal exudate.  Eyes:     General: No scleral icterus.    Pupils: Pupils are equal, round, and reactive to light.  Cardiovascular:     Rate and Rhythm: Normal rate and regular rhythm.     Heart sounds: No murmur heard.   Pulmonary:     Effort: No respiratory distress.     Breath sounds: No rales.     Comments: Decreased breath sound on the right. She breathes comfortably via nasal cannula oxygen Chest:     Chest wall: No tenderness.  Abdominal:     General: There is no distension.     Palpations: Abdomen is soft.     Tenderness: There is no abdominal tenderness.  Musculoskeletal:        General: Normal range of motion.     Cervical back: Normal range of motion and neck supple.  Skin:    General: Skin is warm and dry.     Findings: No erythema.  Neurological:     Mental Status: She is alert and oriented to  person, place, and time.     Cranial Nerves: No cranial nerve deficit.     Motor: No abnormal muscle tone.     Coordination: Coordination normal.  Psychiatric:        Mood and Affect: Affect normal.    right breast mass involving skin      CMP Latest Ref Rng & Units 06/26/2020  Glucose 70 - 99 mg/dL 100(H)  BUN 6 - 20 mg/dL 10  Creatinine 0.44 - 1.00 mg/dL 0.45  Sodium 135 - 145 mmol/L 138  Potassium 3.5 - 5.1 mmol/L 4.1  Chloride 98 - 111 mmol/L 94(L)  CO2 22 - 32 mmol/L 34(H)  Calcium 8.9 - 10.3 mg/dL 8.5(L)  Total Protein 6.5 - 8.1 g/dL -  Total Bilirubin 0.3 - 1.2 mg/dL -  Alkaline Phos 38 - 126 U/L -  AST 15 - 41 U/L -  ALT 0 - 44 U/L -   CBC Latest Ref Rng & Units 06/26/2020  WBC 4.0 - 10.5 K/uL 13.7(H)  Hemoglobin 12.0 - 15.0 g/dL 9.3(L)  Hematocrit 36 - 46 % 31.1(L)  Platelets 150 - 400 K/uL 373    RADIOGRAPHIC STUDIES: I have personally reviewed the radiological images as listed and agreed with the findings in the report. DG Chest 1 View  Result Date: 06/16/2020 CLINICAL DATA:  Hemothorax on the right. EXAM: CHEST  1 VIEW COMPARISON:  Same day chest radiograph. FINDINGS: A right-sided pleural pigtail catheter is redemonstrated. There is interval improvement in aeration of the right lung with decreased size of a right hemothorax. A small right pneumothorax is not significantly changed in size. Severe right atelectasis/airspace disease is redemonstrated. Mild interstitial opacities in the left lung appear unchanged. There is no left pleural effusion or left pneumothorax. The cardiac silhouette is partially obscured but appears unchanged. IMPRESSION: 1. Interval improvement in aeration of the  right lung with decreased size of a right hemothorax. 2. Unchanged small right pneumothorax with pleural pigtail catheter in place. 3. Severe right atelectasis/airspace disease. Electronically Signed   By: Zerita Boers M.D.   On: 06/16/2020 14:52   DG Chest 1 View  Result Date:  06/16/2020 CLINICAL DATA:  Cough and phlegm production. EXAM: CHEST  1 VIEW COMPARISON:  Chest radiograph dated 06/15/2020. FINDINGS: The cardiac silhouette is obscured. A right-sided pleural pigtail catheter is in unchanged location and there has been interval decrease in a large right pleural fluid collection (effusion versus hemothorax). There is no longer mediastinal shift to the left. There is a small right pneumothorax. Severe airspace and interstitial opacities are seen on the right. Mild interstitial opacities are seen on the left. There is no left pleural effusion or pneumothorax. The osseous structures are intact. IMPRESSION: 1. Small right pneumothorax with a right-sided pleural pigtail catheter in place. 2. Interval decrease in a large right pleural fluid collection (effusion versus hemothorax). 3. Severe airspace and interstitial opacities on the right. Mild interstitial opacities in the left. Electronically Signed   By: Zerita Boers M.D.   On: 06/16/2020 14:28   DG Chest 1 View  Result Date: 06/15/2020 CLINICAL DATA:  Short of breath, cough EXAM: CHEST  1 VIEW COMPARISON:  06/29/2015 FINDINGS: 2 frontal views of the chest demonstrate complete opacification of the right hemithorax, with slight mediastinal shift to the left, consistent with large right pleural effusion and underlying lung consolidation. Chronic areas of scarring are seen within the left chest. No pneumothorax. Cardiac silhouette is unremarkable. No acute bony abnormalities. IMPRESSION: 1. Opacification of the right hemithorax consistent with large right pleural effusion and underlying right lung consolidation. Electronically Signed   By: Randa Ngo M.D.   On: 06/15/2020 21:42   CT Angio Chest PE W and/or Wo Contrast  Result Date: 06/15/2020 CLINICAL DATA:  Cough, shortness of breath EXAM: CT ANGIOGRAPHY CHEST WITH CONTRAST TECHNIQUE: Multidetector CT imaging of the chest was performed using the standard protocol during bolus  administration of intravenous contrast. Multiplanar CT image reconstructions and MIPs were obtained to evaluate the vascular anatomy. CONTRAST:  100 mL Omnipaque 350 IV COMPARISON:  06/30/2015 FINDINGS: Cardiovascular: No filling defects in the pulmonary arteries to suggest pulmonary emboli. Heart is normal size. Aorta normal caliber. Mediastinum/Nodes: No visible mediastinal or hilar adenopathy. Bilateral axillary adenopathy, right greater than left. Index right axillary lymph node has a short axis diameter of 13 mm. Left axillary lymph node has a short axis diameter of 11 mm. Lungs/Pleura: Large right pleural effusion completely compressed seen in the right lung which is not aerated. This appears to have mass effect with shift of the right lung and mediastinal structures to the left. Areas of atelectasis or scarring in the left lung base. No effusion on the left. Upper Abdomen: Imaging into the upper abdomen demonstrates no acute findings. Musculoskeletal: Large mass noted in the right breast measuring 8 x 6 cm. Possible separate right breast mass more inferiorly in the right breast measuring 8 x 4 cm. Skin thickening or edema noted. Associated right axillary adenopathy. A few ill-defined sclerotic areas within the thoracic spine could reflect metastases. Review of the MIP images confirms the above findings. IMPRESSION: No evidence of pulmonary embolus. One, possibly 2 separate large masses within the right breast extending to the skin surface with associated adjacent skin thickening and right axillary adenopathy. Findings compatible with right breast cancer. Large right pleural effusion which appears to have  mass effect with complete collapse/compression of the right lung and shift of mediastinal structures to the left. Single enlarged left axillary lymph node. These results were called by telephone at the time of interpretation on 06/15/2020 at 10:43 pm to provider [facility] , who verbally acknowledged these  results. Electronically Signed   By: Rolm Baptise M.D.   On: 06/15/2020 22:46   MR BRAIN W WO CONTRAST  Result Date: 06/24/2020 CLINICAL DATA:  History of metastatic breast cancer.  Staging. EXAM: MRI HEAD WITHOUT AND WITH CONTRAST TECHNIQUE: Multiplanar, multiecho pulse sequences of the brain and surrounding structures were obtained without and with intravenous contrast. CONTRAST:  59mL GADAVIST GADOBUTROL 1 MMOL/ML IV SOLN COMPARISON:  None. FINDINGS: Brain: The brain has a normal appearance without evidence of malformation, atrophy, old or acute small or large vessel infarction, mass lesion, hemorrhage, hydrocephalus or extra-axial collection. After contrast administration, no enhancement the brain or leptomeninges occurs. Vascular: Major vessels at the base of the brain show flow. Venous sinuses appear patent. Skull and upper cervical spine: 1 cm metastasis affecting the clivus. Scattered metastases in the upper cervical spine. Sinuses/Orbits: Clear/normal. Other: None significant. IMPRESSION: 1. Normal appearance of the brain itself. No evidence of metastatic disease to the brain or leptomeninges. 2. 1 cm metastasis affecting the clivus. Scattered metastases in the upper cervical spine. Electronically Signed   By: Nelson Chimes M.D.   On: 06/24/2020 11:53   US Venous Img Lower Unilateral Left (DVT)  Result Date: 06/16/2020 CLINICAL DATA:  Initial evaluation for acute left lower extremity edema. EXAM: LEFT LOWER EXTREMITY VENOUS DOPPLER ULTRASOUND TECHNIQUE: Gray-scale sonography with graded compression, as well as color Doppler and duplex ultrasound were performed to evaluate the lower extremity deep venous systems from the level of the common femoral vein and including the common femoral, femoral, profunda femoral, popliteal and calf veins including the posterior tibial, peroneal and gastrocnemius veins when visible. The superficial great saphenous vein was also interrogated. Spectral Doppler was  utilized to evaluate flow at rest and with distal augmentation maneuvers in the common femoral, femoral and popliteal veins. COMPARISON:  None. FINDINGS: Contralateral Common Femoral Vein: Respiratory phasicity is normal and symmetric with the symptomatic side. No evidence of thrombus. Normal compressibility. Common Femoral Vein: No evidence of thrombus. Normal compressibility, respiratory phasicity and response to augmentation. Saphenofemoral Junction: No evidence of thrombus. Normal compressibility and flow on color Doppler imaging. Profunda Femoral Vein: No evidence of thrombus. Normal compressibility and flow on color Doppler imaging. Femoral Vein: No evidence of thrombus. Normal compressibility, respiratory phasicity and response to augmentation. Popliteal Vein: No evidence of thrombus. Normal compressibility, respiratory phasicity and response to augmentation. Calf Veins: No evidence of thrombus. Normal compressibility and flow on color Doppler imaging. Superficial Great Saphenous Vein: No evidence of thrombus. Normal compressibility. Venous Reflux:  None. Other Findings:  None. IMPRESSION: No evidence of deep venous thrombosis. Electronically Signed   By: Jeannine Boga M.D.   On: 06/16/2020 01:30   DG Chest Port 1 View  Result Date: 06/23/2020 CLINICAL DATA:  Status post video-assisted thoracoscopy. EXAM: PORTABLE CHEST 1 VIEW COMPARISON:  June 22, 2020. FINDINGS: Stable cardiomegaly central pulmonary vascular congestion is noted. Right-sided chest tube has been removed. No pneumothorax is noted. Stable diffuse right lung opacity is noted concerning for pneumonia with associated pleural effusion, including loculated fluid and right lung apex. Small loculated left pleural effusion is noted laterally. Left perihilar and basilar opacity is noted concerning for infiltrate or possibly edema. Bony thorax  is unremarkable. IMPRESSION: Stable diffuse right lung opacity is noted concerning for pneumonia  with associated pleural effusion, including loculated fluid and right lung apex. Small loculated left pleural effusion is noted laterally. Left perihilar and basilar opacity is noted concerning for infiltrate or possibly edema. Electronically Signed   By: Marijo Conception M.D.   On: 06/23/2020 14:59   DG Chest Port 1 View  Result Date: 06/22/2020 CLINICAL DATA:  Preoperative chest evaluation EXAM: PORTABLE CHEST 1 VIEW COMPARISON:  06/19/2020 FINDINGS: Chest tube remains in place on the right. Somewhat less pleural density on the right. There may be a small amount of air in the pleural space. Persistent diffuse infiltrate in the right lung. Patchy infiltrate in the mid and lower lung on the left is somewhat improved. IMPRESSION: 1. Chest tube remains in place on the right. Somewhat less pleural fluid. Question small amount of air in the pleural space. 2. Persistent diffuse infiltrate on the right. 3. Improved aeration in the left mid and lower lung. Electronically Signed   By: Nelson Chimes M.D.   On: 06/22/2020 14:43   DG Chest Port 1 View  Result Date: 06/19/2020 CLINICAL DATA:  Chest tube in place EXAM: PORTABLE CHEST 1 VIEW COMPARISON:  Chest radiograph from one day prior. FINDINGS: Right basilar pigtail chest tube is stable. Stable cardiomediastinal silhouette with mild cardiomegaly. No pneumothorax. Near complete opacification of the right hemithorax with minimally improved central right lung aeration. Extensive fluffy left parahilar lung opacities, slightly worsened. Left retrocardiac consolidation is stable. IMPRESSION: 1. Stable right basilar chest tube without pneumothorax. 2. Near complete opacification of the right hemithorax with minimally improved central right lung aeration, which could be due to any combination of pleural effusion, atelectasis, pneumonia, edema or mass. 3. Stable left retrocardiac consolidation, favor atelectasis. 4. Fluffy left parahilar lung opacities, slightly worsened,  favor pulmonary edema. Electronically Signed   By: Ilona Sorrel M.D.   On: 06/19/2020 15:04   DG CHEST PORT 1 VIEW  Result Date: 06/18/2020 CLINICAL DATA:  Malignant pleural effusion, RIGHT-side chest tube EXAM: PORTABLE CHEST 1 VIEW COMPARISON:  Portable exam 1042 hours compared to 11 x 21 FINDINGS: Pigtail RIGHT thoracostomy tube again seen. Persistent subtotal opacification of the RIGHT hemithorax by effusion and atelectasis, underlying tumor not excluded. Atelectasis versus consolidation of LEFT lower lobe with probable small pleural effusion. Stable heart size. No acute osseous findings. IMPRESSION: Persistent subtotal opacification of the RIGHT hemithorax by effusion and atelectasis, underlying tumor not excluded. Persistent infiltrate and probable pleural effusion at inferior LEFT chest. Electronically Signed   By: Lavonia Dana M.D.   On: 06/18/2020 16:09   DG Chest Port 1 View  Result Date: 06/17/2020 CLINICAL DATA:  Chest x-ray 06/17/2020 5 a.m. EXAM: PORTABLE CHEST 1 VIEW COMPARISON:  Chest x-ray 06/17/2020 4:10 a.m. FINDINGS: Interval increase in size of a likely large right pleural effusion with slight aeration of the right lung. Likely trace pleural effusion on the left. Interval increase in airspace interstitial opacities of the left lung that is most prominent in the left mid lung zone. Right pigtail chest tube overlies the right hemithorax. IMPRESSION: 1. Slightly improved almost complete whiteout of the right chest with slightly decreased in size large pleural effusion. 2. Interval increase in interstitial and alveolar left lung opacities with possible trace left pleural effusion. Electronically Signed   By: Iven Finn M.D.   On: 06/17/2020 21:30   DG Chest Port 1 View  Result Date: 06/17/2020 CLINICAL DATA:  Pleural effusion EXAM: PORTABLE CHEST 1 VIEW COMPARISON:  Yesterday FINDINGS: More dense and homogeneous right sided opacity. Right pleural catheter in stable position. Left  lower lobe opacity which also appears increased. Likely no cardiomegaly when allowing for mediastinal shift. IMPRESSION: White out of the right chest with mediastinal mass effect from large pleural effusion. Retrocardiac infiltrate that could be infection or atelectasis. Electronically Signed   By: Monte Fantasia M.D.   On: 06/17/2020 05:00   DG Chest Portable 1 View  Result Date: 06/15/2020 CLINICAL DATA:  Right chest tube placement. EXAM: PORTABLE CHEST 1 VIEW COMPARISON:  Radiograph and CT earlier today. FINDINGS: Placement of right pigtail catheter. There is persistent complete opacification of the right hemithorax. There may be slight diminished mediastinal shift to the left. No visualized pneumothorax. No focal abnormality in the left lung. IMPRESSION: Placement of right pigtail catheter with persistent complete opacification of the right hemithorax. There may be slight diminished mediastinal shift to the left. No visualized pneumothorax. Electronically Signed   By: Keith Rake M.D.   On: 06/15/2020 23:40   MR TOTAL SPINE METS SCREENING  Result Date: 06/25/2020 CLINICAL DATA:  Metastatic breast cancer. EXAM: MRI TOTAL SPINE WITHOUT AND WITH CONTRAST TECHNIQUE: Multisequence MR imaging of the spine from the cervical spine to the sacrum was performed prior to and following IV contrast administration for evaluation of spinal metastatic disease. Large field of view sagittal imaging was performed. No axial imaging was obtained. CONTRAST:  49mL GADAVIST GADOBUTROL 1 MMOL/ML IV SOLN COMPARISON:  Head MRI 06/24/2020. Chest CTA 06/15/2020. CT abdomen and pelvis 06/25/2015. FINDINGS: MRI CERVICAL SPINE FINDINGS Alignment: Cervical spine straightening.  No significant listhesis. Vertebrae: Heterogeneous bone marrow signal throughout the cervical spine with patchy areas of enhancement consistent with osseous metastatic disease. No evidence of epidural tumor. Cord: Normal signal.  No abnormal intradural  enhancement. Posterior Fossa, vertebral arteries, paraspinal tissues: Posterior fossa more fully evaluated on the recent head MRI. No gross paraspinal mass on this study which does not include axial images. Disc levels: Moderate to severe disc space narrowing from C4-5 to C7-T1 with degenerative endplate changes, disc bulging, and spurring. Assessment for degenerative stenosis is limited in the absence of axial imaging. No compressive spinal stenosis. MRI THORACIC SPINE FINDINGS Alignment: Slight thoracic levoscoliosis. No significant listhesis. Vertebrae: Heterogeneous bone marrow signal throughout the thoracic spine with patchy areas of enhancement consistent with osseous metastatic disease. No evidence of epidural tumor. Cord:  Normal signal.  No abnormal intradural enhancement. Paraspinal and other soft tissues: Partially visualized known large right pleural effusion as well as a small left pleural effusion. Partially visualized known right breast mass. Disc levels: Mild-to-moderate diffuse thoracic disc degeneration with disc bulging. No compressive stenosis. MRI LUMBAR SPINE FINDINGS Segmentation:  Standard. Alignment:  Trace retrolisthesis of L2 on L3. Vertebrae: Heterogeneous bone marrow signal throughout the lumbar spine and sacrum with patchy areas of enhancement consistent with osseous metastatic disease. No evidence of epidural tumor. Conus medullaris: Extends to the L1 level and appears normal. Paraspinal and other soft tissues: No gross paraspinal mass on this study which does not include axial images. Disc levels: Focally advanced disc space narrowing at L2-3 with degenerative endplate changes. Mild disc bulging at L2-3, L3-4, and L4-5 without evidence of compressive stenosis. IMPRESSION: 1. Widespread osseous metastatic disease. No evidence of epidural tumor. 2. Partial coverage of known right breast mass and large right and small left pleural effusions. Electronically Signed   By: Seymour Bars.D.  On: 06/25/2020 15:12    Assessment and plan-   #Acute hypoxic respiratory failure, secondary to right pleural effusion and complete collapse of right lung. Status post thoracentesis and chest tube insertion.  Attempted VATS procedure with talc pleurodesis on 06/23/2020 but was not successful.  Chest tube was removed. Breathing status is stable.  Continue nasal cannula oxygen.  #Metastatic triple negative breast cancer, stage IV, with lung bone involvement. Patient will need outpatient PET scan to complete staging. -Mediport placement today. -Breast mass biopsy for additional tissue for PD-L1 status and molecular testing. -Patient has had chemotherapy education with RN Rosa yesterday.  She reports understanding the education and I answered all her questions for chemotherapy today. -CBC CMP in a.m.  Plan cycle 1 day 1 Taxol 80 mg/m tomorrow.  Discussed with cancer center RN and pharmacist -Recommend genetic testing.  She will meet genetic counselor outpatient.   #Goals of care discussion.  She understand that triple negative breast cancer is a type of aggressive breast cancer and her condition is not curable.  Goals of care is with palliative intent to prolong her life, decrease cancer related symptoms, improve life quality  I was informed by Dr. Leslye Peer that patient's daughter Delana Meyer wants a phone call to discuss patient's condition.  I called Jasmine and updated her above plan.  All questions answered to her satisfaction.  # anemia is multifactorial due to iron deficiency, folate deficiency,  as well as chronic disease., status post Venofer treatment yesterday.  She tolerates well.  She will receive another dose later today. Folic acid 72m daily.   #Bone metastasis, currently she denies any bone pain.  Discussed with her about rational of bisphosphonate.  I will start her on calcium /vitamin d supplementation.   thank you for allowing me to participate in the care of this patient.    ZEarlie Server MD, PhD Hematology Oncology CShriners Hospitals For Children - Erieat ABolivar General HospitalPager- [phone]/16/2021

## 2020-06-28 NOTE — Anesthesia Preprocedure Evaluation (Signed)
Anesthesia Evaluation  Patient identified by MRN, date of birth, ID band Patient awake    Reviewed: Allergy & Precautions, NPO status , Patient's Chart, lab work & pertinent test results  History of Anesthesia Complications Negative for: history of anesthetic complications  Airway Mallampati: II  TM Distance: >3 FB Neck ROM: Full    Dental  (+) Poor Dentition   Pulmonary neg sleep apnea, pneumonia, neg COPD,    breath sounds clear to auscultation- rhonchi (-) wheezing      Cardiovascular (-) hypertension(-) CAD, (-) Past MI, (-) Cardiac Stents and (-) CABG  Rhythm:Regular Rate:Normal - Systolic murmurs and - Diastolic murmurs    Neuro/Psych neg Seizures negative neurological ROS     GI/Hepatic Neg liver ROS, GERD  ,  Endo/Other  negative endocrine ROSneg diabetes  Renal/GU negative Renal ROS     Musculoskeletal negative musculoskeletal ROS (+)   Abdominal (+) - obese,   Peds  Hematology  (+) anemia ,   Anesthesia Other Findings Past Medical History: No date: Anemia   Reproductive/Obstetrics                             Anesthesia Physical Anesthesia Plan  ASA: III  Anesthesia Plan: General   Post-op Pain Management:    Induction: Intravenous  PONV Risk Score and Plan: 2 and Propofol infusion  Airway Management Planned: Natural Airway  Additional Equipment:   Intra-op Plan:   Post-operative Plan:   Informed Consent: I have reviewed the patients History and Physical, chart, labs and discussed the procedure including the risks, benefits and alternatives for the proposed anesthesia with the patient or authorized representative who has indicated his/her understanding and acceptance.     Dental advisory given  Plan Discussed with: CRNA and Anesthesiologist  Anesthesia Plan Comments:         Anesthesia Quick Evaluation

## 2020-06-28 NOTE — Progress Notes (Signed)
Patient ID: Jessica Willis, female   DOB: 04-04-1966, 54 y.o.   MRN: [id number] [facility] PROGRESS NOTE  Jessica Willis LKG:[id number] DOB: 1965/08/25 DOA: 06/15/2020 PCP: System, Provider Not In  HPI/Subjective: Patient seen this morning before breast biopsy and port placement.  Feeling okay.  Breathing okay.  Some cough with taking a deep breath.  Admitted initially with sepsis and found to have metastatic breast cancer.  Objective: Vitals:   06/28/20 0821 06/28/20 1143  BP:  (!) 153/87  Pulse:  96  Resp:  16  Temp:  98.2 F (36.8 C)  SpO2: 98% 99%    Intake/Output Summary (Last 24 hours) at 06/28/2020 1217 Last data filed at 06/28/2020 0545 Gross per 24 hour  Intake 0 ml  Output 450 ml  Net -450 ml   Filed Weights   06/15/20 2114  Weight: 86.2 kg    ROS: Review of Systems  Respiratory: Positive for cough. Negative for shortness of breath.   Cardiovascular: Negative for chest pain.  Gastrointestinal: Negative for abdominal pain, nausea and vomiting.   Exam: Physical Exam HENT:     Head: Normocephalic.     Mouth/Throat:     Pharynx: No oropharyngeal exudate.  Eyes:     General: Lids are normal.     Conjunctiva/sclera: Conjunctivae normal.     Pupils: Pupils are equal, round, and reactive to light.  Cardiovascular:     Rate and Rhythm: Regular rhythm. Tachycardia present.     Heart sounds: Normal heart sounds, S1 normal and S2 normal.  Pulmonary:     Breath sounds: Examination of the right-middle field reveals decreased breath sounds and rhonchi. Examination of the right-lower field reveals decreased breath sounds and rhonchi. Decreased breath sounds and rhonchi present. No wheezing or rales.  Abdominal:     General: Abdomen is flat.     Palpations: Abdomen is soft.     Tenderness: There is no abdominal tenderness.  Musculoskeletal:     Right lower leg: No swelling.     Left lower leg: No swelling.  Skin:    General: Skin is warm.      Comments: Right chest wall mass covered.  Neurological:     Mental Status: She is alert and oriented to person, place, and time.       Data Reviewed: Basic Metabolic Panel: Recent Labs  Lab 06/22/20 0627 06/23/20 0439 06/24/20 0406 06/26/20 0605  NA 138 140 140 138  K 3.9 4.0 4.1 4.1  CL 97* 100 98 94*  CO2 29 32 33* 34*  GLUCOSE 170* 147* 135* 100*  BUN 11 13 13 10   CREATININE 0.46 0.43* 0.51 0.45  CALCIUM 8.7* 8.6* 8.8* 8.5*  MG 2.3 2.4 2.3  --   PHOS 3.1 2.7 2.8  --    CBC: Recent Labs  Lab 06/22/20 0627 06/23/20 0439 06/24/20 0406 06/26/20 0605  WBC 10.0 12.0* 12.7* 13.7*  NEUTROABS  --   --  11.0*  --   HGB 9.6* 8.9* 8.7* 9.3*  HCT 31.1* 28.8* 29.2* 31.1*  MCV 82.9 82.8 84.1 83.8  PLT 378 361 392 373   BNP (last 3 results) Recent Labs    06/15/20 2125  BNP 49.6     Recent Results (from the past 240 hour(s))  Aerobic/Anaerobic Culture (surgical/deep wound)     Status: None   Collection Time: 06/20/20  3:20 PM   Specimen: Wound  Result Value Ref Range Status   Specimen Description   Final    WOUND  Performed at [facility], [address] [place], [place] [address]    Special Requests   Final    RIGHT BREAST Performed at [facility], [place], [place] [address]    Gram Stain   Final    RARE WBC PRESENT,BOTH PMN AND MONONUCLEAR NO ORGANISMS SEEN    Culture   Final    NORMAL SKIN FLORA NO ANAEROBES ISOLATED Performed at [facility], [place] [address] [place], [place] [address]    Report Status 06/25/2020 FINAL  Final     Scheduled Meds: . budesonide (PULMICORT) nebulizer solution  0.25 mg Nebulization BID  . docusate sodium  100 mg Oral BID  . folic acid  1 mg Oral Daily  . ipratropium-albuterol  3 mL Nebulization BID  . mouth rinse  15 mL Mouth Rinse BID  . melatonin  5 mg Oral QHS  . methylPREDNISolone (SOLU-MEDROL) injection  20 mg Intravenous BID  . pantoprazole  40 mg Oral Daily  .  polyethylene glycol  17 g Oral Daily   Continuous Infusions: . iron sucrose    . lactated ringers Stopped (06/23/20 1230)    Assessment/Plan:  1. Acute hypoxic respiratory failure.  Patient currently down to 2 L regular nasal cannula.  Tomorrow morning we will check a pulse ox on room air with ambulation to see if qualifies for home oxygen.  The patient was on heated high flow nasal cannula with very high amounts of oxygen near the beginning of the hospital course and taper down to bubble nasal cannula now over to regular nasal cannula for the last few days. 2. Stage IV metastatic breast cancer through the skin of the chest wall, malignant right pleural effusion and widespread bone metastases.  Triple negative as per oncology.  Patient will have a port placement this afternoon on 06/28/2020.  Patient will have a breast biopsy also.  Dr. Tasia Catchings will give first chemotherapy tonight or tomorrow morning depending on when the patient gets out of surgery today.  Palliative care [place] also following. 3. Large right malignant pleural effusion and hemothorax.  The patient initially had a chest tube.  Dr. Genevive Bi attempted a VATS procedure on 06/23/2020 but this was unsuccessful.  Chest tube was removed at that time.  Patient still has decreased breath sounds on the right. 4. Right arm weakness.  MRI of the brain negative for metastases.  MRI of the spine did show widespread osseous metastatic disease. 5. GERD on Protonix and as needed simethicone 6. Constipation.  This has resolved.  Continue MiraLAX. 7. Iron deficiency anemia.  Oncology gave IV Venofer yesterday. 8. Sepsis present on admission with pneumonia and right chest cellulitis.  The patient did complete entire antibiotic course in the hospital. 9. Tachycardia.  Would like to see how she does with ambulation without other not treat this if I do not have to. 10. Weakness.  Physical therapy recommends rehab versus home with home health.  In speaking  with transitional care team the patient will have to go home with home health.     Code Status:     Code Status Orders  (From admission, onward)         Start     Ordered   06/16/20 0010  Full code  Continuous        06/16/20 0010        Code Status History    Date Active Date Inactive Code Status Order ID Comments User Context  06/23/2015 2341 06/30/2015 1944 Full Code [id number]  Lance Coon, MD Inpatient   Advance Care Planning Activity    Advance Directive Documentation     Most Recent Value  Type of Advance Directive Living will, Healthcare Power of Attorney  Pre-existing out of facility DNR order (yellow form or pink MOST form) --  "MOST" Form in Place? --     Family Communication: Spoke with the patient's daughter on the phone Disposition Plan: Status is: Inpatient  Dispo: The patient is from: Home              Anticipated d/c is to: Home with home health              Anticipated d/c date is: May be 06/30/2020 if tolerates chemo              Patient currently receiving a port and will have first dose of chemotherapy while here in the hospital either tonight or tomorrow morning.  Consultants:  General surgery/cardiothoracic surgery  Oncology  Procedures:  Chest tube  Attempted VATS procedure but unsuccessful and chest tube removal  Port placement today in the afternoon.  Breast biopsy today also  Time spent: 27 minutes  [place]

## 2020-06-28 NOTE — Progress Notes (Signed)
Informed by Magdalene Patricia, RN, our chemotherapy educator at the [facility], of the patient's new diagnosis of metastatic breast cancer.  Patient is currently in the hospital.  She presented to the ED with difficulty breathing.  An 8 cm breast mass was noted on CT scan.  MRI showed widespread boney mets.  Pleural fluid revealed adenocarcinoma favor breast primary.  Patient is scheduled for a Port a cath placement and tissue biopsy today.  I met with the patient in the hospital to assess for BCCCP eligibility.  Patient has been screened for eligibility.  She does not have any insurance, Medicare or Medicaid.  She also meets financial eligibility.  Discussed with patient our BCCCP program, and BCCCP Medicaid.  Medicaid application completed and sent to DSS.    Patient states she had a breast/chest mass for months, but states "I was probably in denial".  Physical exam omitted today.  Patient to start chemotherapy this week.  Navigation services initiated.  Gave patient breast cancer educational literature, "My Breast Cancer Treatment Handbook" by Josephine Igo, RN.  She is to call with any questions or needs.  Risk Assessment    Risk Scores      06/28/2020   Last edited by: Rico Junker, RN   5-year risk: 1.1 %   Lifetime risk: 6.1 %

## 2020-06-28 NOTE — Op Note (Signed)
--  OP NOTE  DATE OF PROCEDURE: 06/28/2020   SURGEON: Lysle Pearl  ANESTHESIA: MAC  PRE-OPERATIVE DIAGNOSIS: Rest cancer requring port for chemotherapy   POST-OPERATIVE DIAGNOSIS: same  PROCEDURE(S):  1.) Percutaneous access of left subclavian vein  2.) Insertion of tunneled left subclavian central venous catheter with subcutaneous port 3) incisional biopsy of right breast mass  INTRAOPERATIVE FINDINGS:  well-secured tunneled central venous catheter with subcutaneous port at completion of the procedure, heplocked after confirming ease of draw and push  ESTIMATED BLOOD LOSS: Minimal (<20 mL)   SPECIMENS: None   IMPLANTS: 78F tunneled Bard PowerPort central venous catheter with subcutaneous port  DRAINS: None   COMPLICATIONS: None apparent   CONDITION AT COMPLETION: Hemodynamically stable, awake   DISPOSITION: PACU   INDICATION(S) FOR PROCEDURE:  Patient is a 54 y.o. female who presented with above diagnosis.  All risks, benefits, and alternatives to above elective procedures were discussed with the patient, who elected to proceed, and informed consent was accordingly obtained at that time.  DETAILS OF PROCEDURE:  Patient was brought to the operative suite and appropriately identified. In Trendelenburg position, left subclavian venous access site was prepped and draped in the usual sterile fashion, and following a timeout, percutaneous venous access was obtained via anatomical landmarks using Seldinger technique, by which local anesthetic was injected over the area, and access needle was inserted, through which soft guidewire was advanced with no resistance, over which access needle was withdrawn.  Incision was extended from the needle insertion site subcutaneous pocket was created on the left chest wall to accommodate port. Insertion sheath was advanced over the guidewire, which was withdrawn along with the insertion sheath dilator with no resistance. The catheter was introduced through  the sheath and tip left in the [place] junction under fluoro guidance and catheter cut to appropriate length.  Catheter connected to port and placed within planned fixation site.  Fluro confirmed no kink within the entire length of the catheter at this point.  Port fixed to the pocket on one corner and around the catheter cap to avoid twisting. Port was confirmed to withdraw blood and flush easily with included Heuber needle, and port heplocked. Dermis at the subcutaneous pocket was re-approximated using buried interrupted 3-0 Vicryl suture, and 4-0 Monocryl suture was used to re-approximate skin at the in running subcuticular fashion.  Incisions then dressed with steristrips, 2x2, and tegaderm.   The friable right breast mass tissue easily visible on the overlying skin of right breast was then removed with a knife, totaling about 1 cm x 1 cm x 1 cm and passed off field pending pathology.  There was moderate amount of bleeding with the small incisional biopsy showed decision was made to not pursue any further biopsies to prevent more blood loss as well as to minimize risk of wound care issues in the future.  Incisional biopsy site was dressed with Surgicel, 4 x 4 gauze and paper tape.  Patient was then awakened from anesthesia and transferred to PACU in stable condition.  Fluoro images saved in Epic.  CXR post op confirmed proper placement of port and no evidence of pneumothorax.

## 2020-06-29 ENCOUNTER — Other Ambulatory Visit: Payer: Self-pay | Admitting: Oncology

## 2020-06-29 ENCOUNTER — Inpatient Hospital Stay: Payer: Medicaid Other

## 2020-06-29 ENCOUNTER — Encounter: Payer: Self-pay | Admitting: Surgery

## 2020-06-29 VITALS — BP 118/70 | HR 86 | Temp 97.4°F | Resp 18 | Wt 179.6 lb

## 2020-06-29 DIAGNOSIS — Z5111 Encounter for antineoplastic chemotherapy: Secondary | ICD-10-CM | POA: Diagnosis not present

## 2020-06-29 DIAGNOSIS — C50919 Malignant neoplasm of unspecified site of unspecified female breast: Secondary | ICD-10-CM

## 2020-06-29 DIAGNOSIS — C7951 Secondary malignant neoplasm of bone: Secondary | ICD-10-CM

## 2020-06-29 LAB — CBC WITH DIFFERENTIAL/PLATELET
Abs Immature Granulocytes: 0.24 10*3/uL — ABNORMAL HIGH (ref 0.00–0.07)
Basophils Absolute: 0 10*3/uL (ref 0.0–0.1)
Basophils Relative: 0 %
Eosinophils Absolute: 0 10*3/uL (ref 0.0–0.5)
Eosinophils Relative: 0 %
HCT: 31.7 % — ABNORMAL LOW (ref 36.0–46.0)
Hemoglobin: 9.6 g/dL — ABNORMAL LOW (ref 12.0–15.0)
Immature Granulocytes: 1 %
Lymphocytes Relative: 5 %
Lymphs Abs: 0.9 10*3/uL (ref 0.7–4.0)
MCH: 25.2 pg — ABNORMAL LOW (ref 26.0–34.0)
MCHC: 30.3 g/dL (ref 30.0–36.0)
MCV: 83.2 fL (ref 80.0–100.0)
Monocytes Absolute: 0.9 10*3/uL (ref 0.1–1.0)
Monocytes Relative: 5 %
Neutro Abs: 16.4 10*3/uL — ABNORMAL HIGH (ref 1.7–7.7)
Neutrophils Relative %: 89 %
Platelets: 345 10*3/uL (ref 150–400)
RBC: 3.81 MIL/uL — ABNORMAL LOW (ref 3.87–5.11)
RDW: 13.2 % (ref 11.5–15.5)
WBC: 18.3 10*3/uL — ABNORMAL HIGH (ref 4.0–10.5)
nRBC: 0.1 % (ref 0.0–0.2)

## 2020-06-29 LAB — COMPREHENSIVE METABOLIC PANEL
ALT: 18 U/L (ref 0–44)
AST: 31 U/L (ref 15–41)
Albumin: 2.8 g/dL — ABNORMAL LOW (ref 3.5–5.0)
Alkaline Phosphatase: 101 U/L (ref 38–126)
Anion gap: 9 (ref 5–15)
BUN: 11 mg/dL (ref 6–20)
CO2: 32 mmol/L (ref 22–32)
Calcium: 8.9 mg/dL (ref 8.9–10.3)
Chloride: 97 mmol/L — ABNORMAL LOW (ref 98–111)
Creatinine, Ser: 0.4 mg/dL — ABNORMAL LOW (ref 0.44–1.00)
GFR, Estimated: >60 mL/min (ref >60)
Glucose, Bld: 115 mg/dL — ABNORMAL HIGH (ref 70–99)
Potassium: 4.1 mmol/L (ref 3.5–5.1)
Sodium: 138 mmol/L (ref 135–145)
Total Bilirubin: 0.4 mg/dL (ref 0.3–1.2)
Total Protein: 6.4 g/dL — ABNORMAL LOW (ref 6.5–8.1)

## 2020-06-29 MED ORDER — ZOLEDRONIC ACID 4 MG/5ML IV CONC: 4.0000 mg | Freq: Once | INTRAVENOUS | Status: DC

## 2020-06-29 MED ORDER — SODIUM CHLORIDE 0.9 % IV SOLN
80.0000 mg/m2 | Freq: Once | INTRAVENOUS | Status: AC
  Administered 2020-06-29: 162 mg via INTRAVENOUS
  Filled 2020-06-29: qty 27

## 2020-06-29 MED ORDER — SODIUM CHLORIDE 0.9 % IV SOLN
10.0000 mg | Freq: Once | INTRAVENOUS | Status: AC
  Administered 2020-06-29: 10 mg via INTRAVENOUS
  Filled 2020-06-29: qty 10

## 2020-06-29 MED ORDER — SODIUM CHLORIDE 0.9% FLUSH
10.0000 mL | INTRAVENOUS | Status: DC | PRN
  Administered 2020-06-29: 10 mL
  Filled 2020-06-29: qty 10

## 2020-06-29 MED ORDER — HEPARIN SOD (PORK) LOCK FLUSH 100 UNIT/ML IV SOLN
INTRAVENOUS | Status: AC
  Filled 2020-06-29: qty 5

## 2020-06-29 MED ORDER — FAMOTIDINE IN NACL 20-0.9 MG/50ML-% IV SOLN
20.0000 mg | Freq: Once | INTRAVENOUS | Status: AC
  Administered 2020-06-29: 20 mg via INTRAVENOUS
  Filled 2020-06-29: qty 50

## 2020-06-29 MED ORDER — SODIUM CHLORIDE 0.9 % IV SOLN
Freq: Once | INTRAVENOUS | Status: AC
  Filled 2020-06-29: qty 250

## 2020-06-29 MED ORDER — DIPHENHYDRAMINE HCL 50 MG/ML IJ SOLN
50.0000 mg | Freq: Once | INTRAMUSCULAR | Status: AC
  Administered 2020-06-29: 50 mg via INTRAVENOUS
  Filled 2020-06-29: qty 1

## 2020-06-29 MED ORDER — HEPARIN SOD (PORK) LOCK FLUSH 100 UNIT/ML IV SOLN
500.0000 [IU] | Freq: Once | INTRAVENOUS | Status: AC | PRN
  Administered 2020-06-29: 500 [IU]
  Filled 2020-06-29: qty 5

## 2020-06-29 NOTE — Progress Notes (Signed)
0930- Patient arrived to [place] infusion area from inpatient unit 2C in a wheel chair with Oxygen 2L per nasal cannula in place and was transported by tele-tracking transport staff.   0933- Patient and vital signs stable.   Pfeiffer.Contes- MD, Dr. Tasia Catchings, came to chair side to see and evaluate patient. Per MD order: proceed with scheduled new Taxol treatment at this time.  1321- Patient tolerated new Taxol treatment well. Patient and vital signs stable. Verbal hand-off report called to inpatient unit 2C RN, Kathline Magic. Patient was transported from [facility] infusion area in a wheelchair with Oxygen 2L per nasal cannula in place back to inpatient unit 2C by tele-tracking transport staff.

## 2020-06-29 NOTE — Progress Notes (Signed)
PROGRESS NOTE    Jessica Willis  GQQ:[id number] DOB: 1966-01-03 DOA: 06/15/2020 PCP: System, Provider Not In   Assessment & Plan:   Principal Problem:   Sepsis ([place]) Active Problems:   Hypokalemia   Breast mass, right   Mastitis   Pleural effusion   Hemothorax on right   Goals of care, counseling/discussion   Chest tube in place   Metastatic breast cancer ([place])   Malignant pleural effusion   Anemia, chronic disease   Tachycardia   Iron deficiency anemia   Weakness of right arm   Palliative care encounter   Gastroesophageal reflux disease without esophagitis   Folate deficiency    Stage IV metastatic breast cancer: through the skin of the chest wall, malignant right pleural effusion and widespread bone metastases.  Triple negative as per oncology.  S/p port placement 06/28/2020.  S/p incisional biopsy of right breast mass as per general surg on 06/28/20. Started chemo 06/30/20 as per onco  Large right malignant pleural effusion and hemothorax: initially had chest tube which has since been removed.  Dr. Genevive Bi attempted a VATS procedure on 06/23/2020 but this was unsuccessful & chest tube was removed at that time.  Acute hypoxic respiratory failure: continue on supplemental oxygen and wean as tolerated. Secondary to above  Right arm weakness: MRI of the brain negative for metastases.  MRI of the spine did show widespread osseous metastatic disease.  GERD: continue on PPI   Constipation: resolved  Iron deficiency anemia: s/p IV iron. Will start po iron when ok with   Sepsis: present on admission with pneumonia and right chest cellulitis. Completed abx course while inpatient   Generalized weakness: PT/OT recs home health   DVT prophylaxis: lovenox Code Status: full  Family Communication:  Disposition Plan: likely d/c home w/ home health   Status is: Inpatient  Remains inpatient appropriate because:Ongoing diagnostic testing needed not appropriate for outpatient  work up, IV treatments appropriate due to intensity of illness or inability to take PO and Inpatient level of care appropriate due to severity of illness   Dispo: The patient is from: Home              Anticipated d/c is to: Home w/ home health               Anticipated d/c date is: 3 days              Patient currently is not medically stable to d/c.     Consultants:   Onco  General surg     Procedures: S/p incisional biopsy of right breast mass & port placement    Antimicrobials:    Subjective: Pt c/o fatigue  Objective: Vitals:   06/28/20 2006 06/28/20 2329 06/29/20 0452 06/29/20 0844  BP:  125/71 135/70 (!) 145/72  Pulse:  99 91 94  Resp:  18  16  Temp:  97.7 F (36.5 C) 98.7 F (37.1 C) 98.3 F (36.8 C)  TempSrc:  Oral  Axillary  SpO2: 97% 97% 99% 100%  Weight:      Height:        Intake/Output Summary (Last 24 hours) at 06/29/2020 0904 Last data filed at 06/29/2020 0000 Gross per 24 hour  Intake 589.45 ml  Output 1310 ml  Net -720.55 ml   Filed Weights   06/15/20 2114 06/28/20 1247  Weight: 86.2 kg 86.2 kg    Examination:  General exam: Appears calm and comfortable  Respiratory system: diminished breath sounds b/l  Cardiovascular  system: S1 & S2 +. No rubs, gallops or clicks.  Gastrointestinal system: Abdomen is nondistended, soft and nontender. Hypoactive bowel sounds heard. Central nervous system: Alert and oriented. Moves all 4 extremities  Psychiatry: Judgement and insight appear normal. Flat mood and affect   Data Reviewed: I have personally reviewed following labs and imaging studies  CBC: Recent Labs  Lab 06/23/20 0439 06/24/20 0406 06/26/20 0605 06/28/20 1622 06/29/20 0617  WBC 12.0* 12.7* 13.7* 17.1* 18.3*  NEUTROABS  --  11.0*  --   --  16.4*  HGB 8.9* 8.7* 9.3* 10.2* 9.6*  HCT 28.8* 29.2* 31.1* 34.1* 31.7*  MCV 82.8 84.1 83.8 84.4 83.2  PLT 361 392 373 389 614   Basic Metabolic Panel: Recent Labs  Lab  06/23/20 0439 06/24/20 0406 06/26/20 0605 06/28/20 1622 06/29/20 0617  NA 140 140 138  --  138  K 4.0 4.1 4.1  --  4.1  CL 100 98 94*  --  97*  CO2 32 33* 34*  --  32  GLUCOSE 147* 135* 100*  --  115*  BUN 13 13 10   --  11  CREATININE 0.43* 0.51 0.45 0.49 0.40*  CALCIUM 8.6* 8.8* 8.5*  --  8.9  MG 2.4 2.3  --   --   --   PHOS 2.7 2.8  --   --   --    GFR: Estimated Creatinine Clearance: 94.8 mL/min (A) (by C-G formula based on SCr of 0.4 mg/dL (L)). Liver Function Tests: Recent Labs  Lab 06/29/20 0617  AST 31  ALT 18  ALKPHOS 101  BILITOT 0.4  PROT 6.4*  ALBUMIN 2.8*   No results for input(s): LIPASE, AMYLASE in the last 168 hours. No results for input(s): AMMONIA in the last 168 hours. Coagulation Profile: No results for input(s): INR, PROTIME in the last 168 hours. Cardiac Enzymes: No results for input(s): CKTOTAL, CKMB, CKMBINDEX, TROPONINI in the last 168 hours. BNP (last 3 results) No results for input(s): PROBNP in the last 8760 hours. HbA1C: No results for input(s): HGBA1C in the last 72 hours. CBG: No results for input(s): GLUCAP in the last 168 hours. Lipid Profile: No results for input(s): CHOL, HDL, LDLCALC, TRIG, CHOLHDL, LDLDIRECT in the last 72 hours. Thyroid Function Tests: No results for input(s): TSH, T4TOTAL, FREET4, T3FREE, THYROIDAB in the last 72 hours. Anemia Panel: No results for input(s): VITAMINB12, FOLATE, FERRITIN, TIBC, IRON, RETICCTPCT in the last 72 hours. Sepsis Labs: No results for input(s): PROCALCITON, LATICACIDVEN in the last 168 hours.  Recent Results (from the past 240 hour(s))  Aerobic/Anaerobic Culture (surgical/deep wound)     Status: None   Collection Time: 06/20/20  3:20 PM   Specimen: Wound  Result Value Ref Range Status   Specimen Description   Final    WOUND Performed at [facility], [address] [place], [place] [address]    Special Requests   Final    RIGHT BREAST Performed at [facility], [address] [place], [place] [address]    Gram Stain   Final    RARE WBC PRESENT,BOTH PMN AND MONONUCLEAR NO ORGANISMS SEEN    Culture   Final    NORMAL SKIN FLORA NO ANAEROBES ISOLATED Performed at [facility], [address] [place], [place] [address]    Report Status 06/25/2020 FINAL  Final         Radiology Studies: DG Chest 1 View  Result Date: 06/28/2020 CLINICAL DATA:  Status  port MediPort catheter placement EXAM: CHEST  1 VIEW COMPARISON:  June 23, 2020 FINDINGS: The heart size and mediastinal contours are unchanged with mild cardiomegaly. There is prominence of the central pulmonary vasculature. Again noted is patchy airspace opacity throughout the right lung with a probable loculated moderate pleural effusion unchanged from prior exam. Left perihilar hazy airspace opacity is noted. A left-sided MediPort catheter seen with the tip at the right atrium. No acute osseous abnormality. IMPRESSION: Left MediPort catheter tip just at the right atrium. No other change in the bilateral perihilar and patchy airspace opacities Loculated right pleural effusion Electronically Signed   By: Prudencio Pair M.D.   On: 06/28/2020 15:31   DG C-Arm 1-60 Min-No Report  Result Date: 06/28/2020 Fluoroscopy was utilized by the requesting physician.  No radiographic interpretation.        Scheduled Meds: . budesonide (PULMICORT) nebulizer solution  0.25 mg Nebulization BID  . calcium carbonate  1 tablet Oral Q breakfast  . Chlorhexidine Gluconate Cloth  6 each Topical Daily  . cholecalciferol  1,000 Units Oral Daily  . docusate sodium  100 mg Oral BID  . enoxaparin (LOVENOX) injection  40 mg Subcutaneous Q24H  . feeding supplement  237 mL Oral BID BM  . folic acid  1 mg Oral Daily  . ipratropium-albuterol  3 mL Nebulization BID  . mouth rinse  15 mL Mouth Rinse BID  . melatonin  5 mg Oral QHS  . methylPREDNISolone (SOLU-MEDROL) injection  20 mg Intravenous BID  .  multivitamin with minerals  1 tablet Oral Daily  . pantoprazole  40 mg Oral Daily  . polyethylene glycol  17 g Oral Daily   Continuous Infusions: . iron sucrose    . lactated ringers 555 mL/hr at 06/28/20 1308     LOS: 14 days    Time spent: 31 mins     Wyvonnia Dusky, MD [facility] Pager [phone]  If 7PM-7AM, please contact night-coverage 06/29/2020, 9:04 AM

## 2020-06-29 NOTE — Progress Notes (Signed)
PT Cancellation Note  Patient Details Name: Jessica Willis MRN: [id number] DOB: 01/13/1966   Cancelled Treatment:    Reason Eval/Treat Not Completed:  (Pt reported being very drowsy from medication this PM, politely declining PT. PT to re-attempt as able.)  Lieutenant Diego PT, DPT 3:18 PM,06/29/20

## 2020-06-29 NOTE — Progress Notes (Signed)
Mobility Specialist - Progress Note   06/29/20 1200  Mobility  Activity Off unit  Mobility performed by Mobility specialist    Per discussion with nurse, [facility]. Will attempt session at another date/time as pt becomes available.    Kathee Delton Mobility Specialist 06/29/20, 12:15 PM

## 2020-06-29 NOTE — TOC Progression Note (Signed)
Transition of Care [facility]) - Progression Note    Patient Details  Name: Jessica Willis MRN: [id number] Date of Birth: 1965/12/15  [facility] CM/SW Contact  Beverly Sessions, RN Phone Number: 06/29/2020, 1:53 PM  Clinical Narrative:    Per Corene Cornea with [place] patient has been accepted for charity home health    Expected Discharge Plan: [place] Barriers to Discharge: Continued Medical Work up  Expected Discharge Plan and Services Expected Discharge Plan: [place] In-house Referral: Clinical Social Work Discharge Planning Services: CM Consult   Living arrangements for the past 2 months: [place]: RN, PT, OT [facility] Agency: [place] ([place]) Date HH Agency Contacted: 06/28/20   Representative spoke with at [place]: [place] ([place]) Interventions    Readmission Risk Interventions No flowsheet data found.

## 2020-06-29 NOTE — Progress Notes (Signed)
Mobility Specialist - Progress Note   06/29/20 1500  Mobility  Activity Refused mobility  Mobility performed by Mobility specialist    Pt politely declined mobility this date d/t feeling drowsy from medications given this afternoon. Pt was encouraged to attempt supine exercises instead of ambulation, but pt continued to decline. Will attempt session at another date/time.   Kathee Delton Mobility Specialist 06/29/20, 3:33 PM

## 2020-06-29 NOTE — Progress Notes (Signed)
[facility] hospital Liaison Note:  New referral for [facility] outpatient Palliative program to follow at home received from [place]. Patient information given to referral.  Flo Shanks BSN, RN, [place] [phone]

## 2020-06-29 NOTE — Progress Notes (Addendum)
Hematology/Oncology Progress Note [facility] [phone] [phone]  Patient Care Team: System, Provider Not In as PCP - General Rico Junker, RN as Registered Nurse   Name of the patient: Jessica Willis  [id number]  Feb 01, 1966  Date of visit: 06/29/20   INTERVAL HISTORY-  Patient is on nasal cannula oxygen 2 L.  No new complaints.  Denies any pain.  Breathing status is stable. Status post Mediport placement yesterday by Dr. Lysle Pearl.  Also status post right breast mass biopsy  Review of systems- Review of Systems  Constitutional: Positive for fatigue. Negative for appetite change.  Eyes: Negative for eye problems.  Respiratory: Positive for shortness of breath. Negative for cough.   Cardiovascular: Negative for leg swelling.  Gastrointestinal: Negative for abdominal pain.  Genitourinary: Negative for dysuria.   Musculoskeletal: Negative for back pain and neck pain.  Skin: Negative for rash.  Neurological: Negative for light-headedness.  Psychiatric/Behavioral: Negative for confusion.    No Known Allergies  Patient Active Problem List   Diagnosis Date Noted  . Folate deficiency   . Gastroesophageal reflux disease without esophagitis   . Weakness of right arm   . Palliative care encounter   . Iron deficiency anemia   . Anemia, chronic disease   . Tachycardia   . Metastatic breast cancer ([place])   . Malignant pleural effusion   . Chest tube in place   . Goals of care, counseling/discussion   . Hemothorax on right 06/16/2020  . Acute respiratory failure with hypoxia ([place])   . Breast mass, right 06/15/2020  . Mastitis 06/15/2020  . Pleural effusion 06/15/2020  . Intestinal obstruction ([place])   . Leukocytosis   . Hypokalemia 06/24/2015  . Sepsis ([place]) 06/23/2015  . CAP (community acquired pneumonia) 06/23/2015  . Partial small bowel obstruction ([place]) 06/23/2015     Past Medical History:  Diagnosis Date  . Anemia    . Patient denies medical problems      Past Surgical History:  Procedure Laterality Date  . BREAST BIOPSY  06/28/2020   Procedure: BREAST BIOPSY;  Surgeon: Benjamine Sprague, DO;  Location: [facility] ORS;  Service: General;;  . PORTACATH PLACEMENT N/A 06/28/2020   Procedure: INSERTION PORT-A-CATH;  Surgeon: Benjamine Sprague, DO;  Location: [facility] ORS;  Service: General;  Laterality: N/A;  . TUBAL LIGATION    . VIDEO ASSISTED THORACOSCOPY (VATS)/THOROCOTOMY Right 06/23/2020   Procedure: ATTEMPTED VIDEO ASSISTED THORACOSCOPY (VATS);  Surgeon: Nestor Lewandowsky, MD;  Location: [facility] ORS;  Service: General;  Laterality: Right;    Social History   Socioeconomic History  . Marital status: Single    Spouse name: Not on file  . Number of children: Not on file  . Years of education: Not on file  . Highest education level: Not on file  Occupational History  . Not on file  Tobacco Use  . Smoking status: Never Smoker  . Smokeless tobacco: Never Used  Substance and Sexual Activity  . Alcohol use: Not Currently    Alcohol/week: 1.0 standard drink    Types: 1 Cans of beer per week  . Drug use: No  . Sexual activity: Not on file  Other Topics Concern  . Not on file  Social History Narrative  . Not on file   Social Determinants of Health   Financial Resource Strain:   . Difficulty of Paying Living Expenses: Not on file  Food Insecurity:   . Worried About Charity fundraiser in the Last Year: Not on  file  . [place] in the Last Year: Not on file  Transportation Needs:   . Lack of Transportation (Medical): Not on file  . Lack of Transportation (Non-Medical): Not on file  Physical Activity:   . Days of Exercise per Week: Not on file  . Minutes of Exercise per Session: Not on file  Stress:   . Feeling of Stress : Not on file  Social Connections:   . Frequency of Communication with Friends and Family: Not on file  . Frequency of Social Gatherings with Friends and Family: Not on file  . Attends  Religious Services: Not on file  . Active Member of Clubs or Organizations: Not on file  . Attends Archivist Meetings: Not on file  . Marital Status: Not on file  Intimate Partner Violence:   . Fear of Current or Ex-Partner: Not on file  . Emotionally Abused: Not on file  . Physically Abused: Not on file  . Sexually Abused: Not on file     Family History  Problem Relation Age of Onset  . Diabetes Other   . Hypertension Other      Current Facility-Administered Medications:  .  acetaminophen (TYLENOL) tablet 650 mg, 650 mg, Oral, Q6H PRN, 650 mg at 06/22/20 1838 **OR** acetaminophen (TYLENOL) suppository 650 mg, 650 mg, Rectal, Q6H PRN, Sakai, Isami, DO .  budesonide (PULMICORT) nebulizer solution 0.25 mg, 0.25 mg, Nebulization, BID, Sakai, Isami, DO, 0.25 mg at 06/28/20 2006 .  calcium carbonate (OS-CAL - dosed in mg of elemental calcium) tablet 500 mg of elemental calcium, 1 tablet, Oral, Q breakfast, Sakai, Isami, DO .  Chlorhexidine Gluconate Cloth 2 % PADS 6 each, 6 each, Topical, Daily, Wieting, Richard, MD .  cholecalciferol (VITAMIN D3) tablet 1,000 Units, 1,000 Units, Oral, Daily, Sakai, Isami, DO .  docusate sodium (COLACE) capsule 100 mg, 100 mg, Oral, BID, Sakai, Isami, DO, 100 mg at 06/28/20 2146 .  enoxaparin (LOVENOX) injection 40 mg, 40 mg, Subcutaneous, Q24H, Sakai, Isami, DO .  feeding supplement (ENSURE ENLIVE / ENSURE PLUS) liquid 237 mL, 237 mL, Oral, BID BM, Sakai, Isami, DO .  folic acid (FOLVITE) tablet 1 mg, 1 mg, Oral, Daily, Sakai, Isami, DO, 1 mg at 06/28/20 1543 .  ipratropium-albuterol (DUONEB) 0.5-2.5 (3) MG/3ML nebulizer solution 3 mL, 3 mL, Nebulization, BID, Sakai, Isami, DO, 3 mL at 06/28/20 2006 .  iron sucrose (VENOFER) 200 mg in sodium chloride 0.9 % 100 mL IVPB, 200 mg, Intravenous, Q24H, Sakai, Isami, DO .  lactated ringers infusion, , Intravenous, Continuous, Sakai, Isami, DO, Last Rate: 555 mL/hr at 06/28/20 1308, New Bag at 06/28/20  1308 .  lactulose (CHRONULAC) 10 GM/15ML solution 30 g, 30 g, Oral, Daily PRN, Sakai, Isami, DO .  LORazepam (ATIVAN) injection 1 mg, 1 mg, Intravenous, Q6H PRN, Sakai, Isami, DO, 1 mg at 06/20/20 2131 .  MEDLINE mouth rinse, 15 mL, Mouth Rinse, BID, Sakai, Isami, DO, 15 mL at 06/28/20 2147 .  melatonin tablet 5 mg, 5 mg, Oral, QHS, Sakai, Isami, DO, 5 mg at 06/28/20 2146 .  methylPREDNISolone sodium succinate (SOLU-MEDROL) 40 mg/mL injection 20 mg, 20 mg, Intravenous, BID, Sakai, Isami, DO, 20 mg at 06/28/20 2147 .  metoprolol tartrate (LOPRESSOR) injection 5 mg, 5 mg, Intravenous, Q6H PRN, Sakai, Isami, DO, 5 mg at 06/21/20 2049 .  morphine 2 MG/ML injection 1-2 mg, 1-2 mg, Intravenous, Q4H PRN, Sakai, Isami, DO, 2 mg at 06/24/20 0910 .  multivitamin with minerals tablet  1 tablet, 1 tablet, Oral, Daily, Sakai, Isami, DO .  ondansetron (ZOFRAN) tablet 4 mg, 4 mg, Oral, Q6H PRN, 4 mg at 06/25/20 1014 **OR** ondansetron (ZOFRAN) injection 4 mg, 4 mg, Intravenous, Q6H PRN, Sakai, Isami, DO, 4 mg at 06/21/20 1116 .  ondansetron (ZOFRAN) tablet 8 mg, 8 mg, Oral, Q8H PRN, Sakai, Isami, DO .  pantoprazole (PROTONIX) EC tablet 40 mg, 40 mg, Oral, Daily, Sakai, Isami, DO, 40 mg at 06/28/20 1544 .  polyethylene glycol (MIRALAX / GLYCOLAX) packet 17 g, 17 g, Oral, Daily, Sakai, Isami, DO, 17 g at 06/28/20 1545 .  simethicone (MYLICON) chewable tablet 80 mg, 80 mg, Oral, QID PRN, Sakai, Isami, DO, 80 mg at 06/25/20 1014 .  traMADol (ULTRAM) tablet 50 mg, 50 mg, Oral, Q6H PRN, Sakai, Isami, DO, 50 mg at 06/19/20 1401 .  traZODone (DESYREL) tablet 50 mg, 50 mg, Oral, QHS PRN, Sakai, Isami, DO, 50 mg at 06/24/20 2046  Facility-Administered Medications Ordered in Other Encounters:  .  heparin lock flush 100 unit/mL, 500 Units, Intracatheter, Once PRN, Earlie Server, MD .  PACLitaxel (TAXOL) 162 mg in sodium chloride 0.9 % 250 mL chemo infusion (</= 4m/m2), 80 mg/m2 (Order-Specific), Intravenous, Once, YEarlie Server MD,  Last Rate: 125 mL/hr at 06/29/20 1141, Rate Verify at 06/29/20 1141 .  sodium chloride flush (NS) 0.9 % injection 10 mL, 10 mL, Intracatheter, PRN, YEarlie Server MD, 10 mL at 06/29/20 0945   Physical exam:  Vitals:   06/28/20 2329 06/29/20 0452 06/29/20 0844 06/29/20 0900  BP: 125/71 135/70 (!) 145/72   Pulse: 99 91 94   Resp: 18  16   Temp: 97.7 F (36.5 C) 98.7 F (37.1 C) 98.3 F (36.8 C)   TempSrc: Oral  Axillary   SpO2: 97% 99% 100% 99%  Weight:      Height:       Physical Exam Constitutional:      General: She is not in acute distress.    Appearance: She is not diaphoretic.  HENT:     Head: Normocephalic and atraumatic.     Nose: Nose normal.     Mouth/Throat:     Pharynx: No oropharyngeal exudate.  Eyes:     General: No scleral icterus.    Pupils: Pupils are equal, round, and reactive to light.  Cardiovascular:     Rate and Rhythm: Normal rate and regular rhythm.     Heart sounds: No murmur heard.   Pulmonary:     Effort: No respiratory distress.     Breath sounds: No rales.     Comments: Decreased breath sound on the right. She breathes comfortably via nasal cannula oxygen Chest:     Chest wall: No tenderness.  Abdominal:     General: There is no distension.     Palpations: Abdomen is soft.     Tenderness: There is no abdominal tenderness.  Musculoskeletal:        General: Normal range of motion.     Cervical back: Normal range of motion and neck supple.  Skin:    General: Skin is warm and dry.     Findings: No erythema.  Neurological:     Mental Status: She is alert and oriented to person, place, and time.     Cranial Nerves: No cranial nerve deficit.     Motor: No abnormal muscle tone.     Coordination: Coordination normal.  Psychiatric:        Mood and Affect: Affect normal.  right breast mass involving skin      CMP Latest Ref Rng & Units 06/29/2020  Glucose 70 - 99 mg/dL 115(H)  BUN 6 - 20 mg/dL 11  Creatinine 0.44 - 1.00 mg/dL 0.40(L)   Sodium 135 - 145 mmol/L 138  Potassium 3.5 - 5.1 mmol/L 4.1  Chloride 98 - 111 mmol/L 97(L)  CO2 22 - 32 mmol/L 32  Calcium 8.9 - 10.3 mg/dL 8.9  Total Protein 6.5 - 8.1 g/dL 6.4(L)  Total Bilirubin 0.3 - 1.2 mg/dL 0.4  Alkaline Phos 38 - 126 U/L 101  AST 15 - 41 U/L 31  ALT 0 - 44 U/L 18   CBC Latest Ref Rng & Units 06/29/2020  WBC 4.0 - 10.5 K/uL 18.3(H)  Hemoglobin 12.0 - 15.0 g/dL 9.6(L)  Hematocrit 36 - 46 % 31.7(L)  Platelets 150 - 400 K/uL 345    RADIOGRAPHIC STUDIES: I have personally reviewed the radiological images as listed and agreed with the findings in the report. DG Chest 1 View  Result Date: 06/28/2020 CLINICAL DATA:  Status port MediPort catheter placement EXAM: CHEST  1 VIEW COMPARISON:  June 23, 2020 FINDINGS: The heart size and mediastinal contours are unchanged with mild cardiomegaly. There is prominence of the central pulmonary vasculature. Again noted is patchy airspace opacity throughout the right lung with a probable loculated moderate pleural effusion unchanged from prior exam. Left perihilar hazy airspace opacity is noted. A left-sided MediPort catheter seen with the tip at the right atrium. No acute osseous abnormality. IMPRESSION: Left MediPort catheter tip just at the right atrium. No other change in the bilateral perihilar and patchy airspace opacities Loculated right pleural effusion Electronically Signed   By: Prudencio Pair M.D.   On: 06/28/2020 15:31   DG Chest 1 View  Result Date: 06/16/2020 CLINICAL DATA:  Hemothorax on the right. EXAM: CHEST  1 VIEW COMPARISON:  Same day chest radiograph. FINDINGS: A right-sided pleural pigtail catheter is redemonstrated. There is interval improvement in aeration of the right lung with decreased size of a right hemothorax. A small right pneumothorax is not significantly changed in size. Severe right atelectasis/airspace disease is redemonstrated. Mild interstitial opacities in the left lung appear unchanged. There  is no left pleural effusion or left pneumothorax. The cardiac silhouette is partially obscured but appears unchanged. IMPRESSION: 1. Interval improvement in aeration of the right lung with decreased size of a right hemothorax. 2. Unchanged small right pneumothorax with pleural pigtail catheter in place. 3. Severe right atelectasis/airspace disease. Electronically Signed   By: Zerita Boers M.D.   On: 06/16/2020 14:52   DG Chest 1 View  Result Date: 06/16/2020 CLINICAL DATA:  Cough and phlegm production. EXAM: CHEST  1 VIEW COMPARISON:  Chest radiograph dated 06/15/2020. FINDINGS: The cardiac silhouette is obscured. A right-sided pleural pigtail catheter is in unchanged location and there has been interval decrease in a large right pleural fluid collection (effusion versus hemothorax). There is no longer mediastinal shift to the left. There is a small right pneumothorax. Severe airspace and interstitial opacities are seen on the right. Mild interstitial opacities are seen on the left. There is no left pleural effusion or pneumothorax. The osseous structures are intact. IMPRESSION: 1. Small right pneumothorax with a right-sided pleural pigtail catheter in place. 2. Interval decrease in a large right pleural fluid collection (effusion versus hemothorax). 3. Severe airspace and interstitial opacities on the right. Mild interstitial opacities in the left. Electronically Signed   By: Harley Hallmark.D.  On: 06/16/2020 14:28   DG Chest 1 View  Result Date: 06/15/2020 CLINICAL DATA:  Short of breath, cough EXAM: CHEST  1 VIEW COMPARISON:  06/29/2015 FINDINGS: 2 frontal views of the chest demonstrate complete opacification of the right hemithorax, with slight mediastinal shift to the left, consistent with large right pleural effusion and underlying lung consolidation. Chronic areas of scarring are seen within the left chest. No pneumothorax. Cardiac silhouette is unremarkable. No acute bony abnormalities.  IMPRESSION: 1. Opacification of the right hemithorax consistent with large right pleural effusion and underlying right lung consolidation. Electronically Signed   By: Randa Ngo M.D.   On: 06/15/2020 21:42   CT Angio Chest PE W and/or Wo Contrast  Result Date: 06/15/2020 CLINICAL DATA:  Cough, shortness of breath EXAM: CT ANGIOGRAPHY CHEST WITH CONTRAST TECHNIQUE: Multidetector CT imaging of the chest was performed using the standard protocol during bolus administration of intravenous contrast. Multiplanar CT image reconstructions and MIPs were obtained to evaluate the vascular anatomy. CONTRAST:  100 mL Omnipaque 350 IV COMPARISON:  06/30/2015 FINDINGS: Cardiovascular: No filling defects in the pulmonary arteries to suggest pulmonary emboli. Heart is normal size. Aorta normal caliber. Mediastinum/Nodes: No visible mediastinal or hilar adenopathy. Bilateral axillary adenopathy, right greater than left. Index right axillary lymph node has a short axis diameter of 13 mm. Left axillary lymph node has a short axis diameter of 11 mm. Lungs/Pleura: Large right pleural effusion completely compressed seen in the right lung which is not aerated. This appears to have mass effect with shift of the right lung and mediastinal structures to the left. Areas of atelectasis or scarring in the left lung base. No effusion on the left. Upper Abdomen: Imaging into the upper abdomen demonstrates no acute findings. Musculoskeletal: Large mass noted in the right breast measuring 8 x 6 cm. Possible separate right breast mass more inferiorly in the right breast measuring 8 x 4 cm. Skin thickening or edema noted. Associated right axillary adenopathy. A few ill-defined sclerotic areas within the thoracic spine could reflect metastases. Review of the MIP images confirms the above findings. IMPRESSION: No evidence of pulmonary embolus. One, possibly 2 separate large masses within the right breast extending to the skin surface with  associated adjacent skin thickening and right axillary adenopathy. Findings compatible with right breast cancer. Large right pleural effusion which appears to have mass effect with complete collapse/compression of the right lung and shift of mediastinal structures to the left. Single enlarged left axillary lymph node. These results were called by telephone at the time of interpretation on 06/15/2020 at 10:43 pm to provider [facility] , who verbally acknowledged these results. Electronically Signed   By: Rolm Baptise M.D.   On: 06/15/2020 22:46   MR BRAIN W WO CONTRAST  Result Date: 06/24/2020 CLINICAL DATA:  History of metastatic breast cancer.  Staging. EXAM: MRI HEAD WITHOUT AND WITH CONTRAST TECHNIQUE: Multiplanar, multiecho pulse sequences of the brain and surrounding structures were obtained without and with intravenous contrast. CONTRAST:  82m GADAVIST GADOBUTROL 1 MMOL/ML IV SOLN COMPARISON:  None. FINDINGS: Brain: The brain has a normal appearance without evidence of malformation, atrophy, old or acute small or large vessel infarction, mass lesion, hemorrhage, hydrocephalus or extra-axial collection. After contrast administration, no enhancement the brain or leptomeninges occurs. Vascular: Major vessels at the base of the brain show flow. Venous sinuses appear patent. Skull and upper cervical spine: 1 cm metastasis affecting the clivus. Scattered metastases in the upper cervical spine. Sinuses/Orbits: Clear/normal. Other: None  significant. IMPRESSION: 1. Normal appearance of the brain itself. No evidence of metastatic disease to the brain or leptomeninges. 2. 1 cm metastasis affecting the clivus. Scattered metastases in the upper cervical spine. Electronically Signed   By: Nelson Chimes M.D.   On: 06/24/2020 11:53   US Venous Img Lower Unilateral Left (DVT)  Result Date: 06/16/2020 CLINICAL DATA:  Initial evaluation for acute left lower extremity edema. EXAM: LEFT LOWER EXTREMITY VENOUS DOPPLER  ULTRASOUND TECHNIQUE: Gray-scale sonography with graded compression, as well as color Doppler and duplex ultrasound were performed to evaluate the lower extremity deep venous systems from the level of the common femoral vein and including the common femoral, femoral, profunda femoral, popliteal and calf veins including the posterior tibial, peroneal and gastrocnemius veins when visible. The superficial great saphenous vein was also interrogated. Spectral Doppler was utilized to evaluate flow at rest and with distal augmentation maneuvers in the common femoral, femoral and popliteal veins. COMPARISON:  None. FINDINGS: Contralateral Common Femoral Vein: Respiratory phasicity is normal and symmetric with the symptomatic side. No evidence of thrombus. Normal compressibility. Common Femoral Vein: No evidence of thrombus. Normal compressibility, respiratory phasicity and response to augmentation. Saphenofemoral Junction: No evidence of thrombus. Normal compressibility and flow on color Doppler imaging. Profunda Femoral Vein: No evidence of thrombus. Normal compressibility and flow on color Doppler imaging. Femoral Vein: No evidence of thrombus. Normal compressibility, respiratory phasicity and response to augmentation. Popliteal Vein: No evidence of thrombus. Normal compressibility, respiratory phasicity and response to augmentation. Calf Veins: No evidence of thrombus. Normal compressibility and flow on color Doppler imaging. Superficial Great Saphenous Vein: No evidence of thrombus. Normal compressibility. Venous Reflux:  None. Other Findings:  None. IMPRESSION: No evidence of deep venous thrombosis. Electronically Signed   By: Jeannine Boga M.D.   On: 06/16/2020 01:30   DG Chest Port 1 View  Result Date: 06/23/2020 CLINICAL DATA:  Status post video-assisted thoracoscopy. EXAM: PORTABLE CHEST 1 VIEW COMPARISON:  June 22, 2020. FINDINGS: Stable cardiomegaly central pulmonary vascular congestion is noted.  Right-sided chest tube has been removed. No pneumothorax is noted. Stable diffuse right lung opacity is noted concerning for pneumonia with associated pleural effusion, including loculated fluid and right lung apex. Small loculated left pleural effusion is noted laterally. Left perihilar and basilar opacity is noted concerning for infiltrate or possibly edema. Bony thorax is unremarkable. IMPRESSION: Stable diffuse right lung opacity is noted concerning for pneumonia with associated pleural effusion, including loculated fluid and right lung apex. Small loculated left pleural effusion is noted laterally. Left perihilar and basilar opacity is noted concerning for infiltrate or possibly edema. Electronically Signed   By: Marijo Conception M.D.   On: 06/23/2020 14:59   DG Chest Port 1 View  Result Date: 06/22/2020 CLINICAL DATA:  Preoperative chest evaluation EXAM: PORTABLE CHEST 1 VIEW COMPARISON:  06/19/2020 FINDINGS: Chest tube remains in place on the right. Somewhat less pleural density on the right. There may be a small amount of air in the pleural space. Persistent diffuse infiltrate in the right lung. Patchy infiltrate in the mid and lower lung on the left is somewhat improved. IMPRESSION: 1. Chest tube remains in place on the right. Somewhat less pleural fluid. Question small amount of air in the pleural space. 2. Persistent diffuse infiltrate on the right. 3. Improved aeration in the left mid and lower lung. Electronically Signed   By: Nelson Chimes M.D.   On: 06/22/2020 14:43   DG Chest [facility]  Result Date: 06/19/2020 CLINICAL DATA:  Chest tube in place EXAM: PORTABLE CHEST 1 VIEW COMPARISON:  Chest radiograph from one day prior. FINDINGS: Right basilar pigtail chest tube is stable. Stable cardiomediastinal silhouette with mild cardiomegaly. No pneumothorax. Near complete opacification of the right hemithorax with minimally improved central right lung aeration. Extensive fluffy left parahilar lung  opacities, slightly worsened. Left retrocardiac consolidation is stable. IMPRESSION: 1. Stable right basilar chest tube without pneumothorax. 2. Near complete opacification of the right hemithorax with minimally improved central right lung aeration, which could be due to any combination of pleural effusion, atelectasis, pneumonia, edema or mass. 3. Stable left retrocardiac consolidation, favor atelectasis. 4. Fluffy left parahilar lung opacities, slightly worsened, favor pulmonary edema. Electronically Signed   By: Ilona Sorrel M.D.   On: 06/19/2020 15:04   DG CHEST PORT 1 VIEW  Result Date: 06/18/2020 CLINICAL DATA:  Malignant pleural effusion, RIGHT-side chest tube EXAM: PORTABLE CHEST 1 VIEW COMPARISON:  Portable exam 1042 hours compared to 11 x 21 FINDINGS: Pigtail RIGHT thoracostomy tube again seen. Persistent subtotal opacification of the RIGHT hemithorax by effusion and atelectasis, underlying tumor not excluded. Atelectasis versus consolidation of LEFT lower lobe with probable small pleural effusion. Stable heart size. No acute osseous findings. IMPRESSION: Persistent subtotal opacification of the RIGHT hemithorax by effusion and atelectasis, underlying tumor not excluded. Persistent infiltrate and probable pleural effusion at inferior LEFT chest. Electronically Signed   By: Lavonia Dana M.D.   On: 06/18/2020 16:09   DG Chest Port 1 View  Result Date: 06/17/2020 CLINICAL DATA:  Chest x-ray 06/17/2020 5 a.m. EXAM: PORTABLE CHEST 1 VIEW COMPARISON:  Chest x-ray 06/17/2020 4:10 a.m. FINDINGS: Interval increase in size of a likely large right pleural effusion with slight aeration of the right lung. Likely trace pleural effusion on the left. Interval increase in airspace interstitial opacities of the left lung that is most prominent in the left mid lung zone. Right pigtail chest tube overlies the right hemithorax. IMPRESSION: 1. Slightly improved almost complete whiteout of the right chest with slightly  decreased in size large pleural effusion. 2. Interval increase in interstitial and alveolar left lung opacities with possible trace left pleural effusion. Electronically Signed   By: Iven Finn M.D.   On: 06/17/2020 21:30   DG Chest Port 1 View  Result Date: 06/17/2020 CLINICAL DATA:  Pleural effusion EXAM: PORTABLE CHEST 1 VIEW COMPARISON:  Yesterday FINDINGS: More dense and homogeneous right sided opacity. Right pleural catheter in stable position. Left lower lobe opacity which also appears increased. Likely no cardiomegaly when allowing for mediastinal shift. IMPRESSION: White out of the right chest with mediastinal mass effect from large pleural effusion. Retrocardiac infiltrate that could be infection or atelectasis. Electronically Signed   By: Monte Fantasia M.D.   On: 06/17/2020 05:00   DG Chest Portable 1 View  Result Date: 06/15/2020 CLINICAL DATA:  Right chest tube placement. EXAM: PORTABLE CHEST 1 VIEW COMPARISON:  Radiograph and CT earlier today. FINDINGS: Placement of right pigtail catheter. There is persistent complete opacification of the right hemithorax. There may be slight diminished mediastinal shift to the left. No visualized pneumothorax. No focal abnormality in the left lung. IMPRESSION: Placement of right pigtail catheter with persistent complete opacification of the right hemithorax. There may be slight diminished mediastinal shift to the left. No visualized pneumothorax. Electronically Signed   By: Keith Rake M.D.   On: 06/15/2020 23:40   DG C-Arm 1-60 Min-No Report  Result Date: 06/28/2020 Fluoroscopy was  utilized by the requesting physician.  No radiographic interpretation.   MR TOTAL SPINE METS SCREENING  Result Date: 06/25/2020 CLINICAL DATA:  Metastatic breast cancer. EXAM: MRI TOTAL SPINE WITHOUT AND WITH CONTRAST TECHNIQUE: Multisequence MR imaging of the spine from the cervical spine to the sacrum was performed prior to and following IV contrast  administration for evaluation of spinal metastatic disease. Large field of view sagittal imaging was performed. No axial imaging was obtained. CONTRAST:  76m GADAVIST GADOBUTROL 1 MMOL/ML IV SOLN COMPARISON:  Head MRI 06/24/2020. Chest CTA 06/15/2020. CT abdomen and pelvis 06/25/2015. FINDINGS: MRI CERVICAL SPINE FINDINGS Alignment: Cervical spine straightening.  No significant listhesis. Vertebrae: Heterogeneous bone marrow signal throughout the cervical spine with patchy areas of enhancement consistent with osseous metastatic disease. No evidence of epidural tumor. Cord: Normal signal.  No abnormal intradural enhancement. Posterior Fossa, vertebral arteries, paraspinal tissues: Posterior fossa more fully evaluated on the recent head MRI. No gross paraspinal mass on this study which does not include axial images. Disc levels: Moderate to severe disc space narrowing from C4-5 to C7-T1 with degenerative endplate changes, disc bulging, and spurring. Assessment for degenerative stenosis is limited in the absence of axial imaging. No compressive spinal stenosis. MRI THORACIC SPINE FINDINGS Alignment: Slight thoracic levoscoliosis. No significant listhesis. Vertebrae: Heterogeneous bone marrow signal throughout the thoracic spine with patchy areas of enhancement consistent with osseous metastatic disease. No evidence of epidural tumor. Cord:  Normal signal.  No abnormal intradural enhancement. Paraspinal and other soft tissues: Partially visualized known large right pleural effusion as well as a small left pleural effusion. Partially visualized known right breast mass. Disc levels: Mild-to-moderate diffuse thoracic disc degeneration with disc bulging. No compressive stenosis. MRI LUMBAR SPINE FINDINGS Segmentation:  Standard. Alignment:  Trace retrolisthesis of L2 on L3. Vertebrae: Heterogeneous bone marrow signal throughout the lumbar spine and sacrum with patchy areas of enhancement consistent with osseous metastatic  disease. No evidence of epidural tumor. Conus medullaris: Extends to the L1 level and appears normal. Paraspinal and other soft tissues: No gross paraspinal mass on this study which does not include axial images. Disc levels: Focally advanced disc space narrowing at L2-3 with degenerative endplate changes. Mild disc bulging at L2-3, L3-4, and L4-5 without evidence of compressive stenosis. IMPRESSION: 1. Widespread osseous metastatic disease. No evidence of epidural tumor. 2. Partial coverage of known right breast mass and large right and small left pleural effusions. Electronically Signed   By: ALogan BoresM.D.   On: 06/25/2020 15:12    Assessment and plan-   #Acute hypoxic respiratory failure, secondary to right pleural effusion and complete collapse of right lung. Status post thoracentesis and chest tube insertion.  Attempted VATS procedure with talc pleurodesis on 06/23/2020 but was not successful.  Chest tube was removed. Breathing status is stable.  Continue nasal cannula oxygen.  #Metastatic triple negative breast cancer, stage IV, with lung bone involvement. Patient will need outpatient PET scan to complete staging. -s/p Mediport placement-Breast mass biopsy for additional tissue for PD-L1 status and molecular testing. -Labs reviewed and discussed with patient.  Proceed with cycle 1 day 1 Taxol 80 mg/m today.  Discussed with cancer center RN and pharmacist -Recommend genetic testing.  She will meet genetic counselor outpatient on 07/04/2020. -Antiemetics Zofran 8 mg p.o. as needed for nausea.-Please give patient a prescription of Zofran upon discharge.  #Goals of care discussion.   Palliative intent.  Discussed with patient and daughter JDelana Meyer # anemia is multifactorial due to iron deficiency,  folate deficiency,  as well as chronic disease., status post Venofer treatment yesterday.  She tolerates well.  Status post  Venofer she tolerated well.  Continue folic acid 48m daily.   #Bone  metastasis, currently she denies any bone pain.  Discussed with her about rational of bisphosphonate.  I will start her on calcium /vitamin d supplementation.  Calcium level has improved.  Outpatient [facility].   Continue PT/OT.  Disposition Home versus SNF. Currently patient has an appointment with me tentatively on 07/04/2020 for posthospitalization follow-up, she has next cycle of Taxol chemotherapy scheduled on 11/24 2021.  She will also see additive care service, genetic counselor on 07/04/2020. thank you for allowing me to participate in the care of this patient.   ZEarlie Server MD, PhD Hematology Oncology CAbra[facility] Campusat ATorrance Memorial Medical CenterPager- [phone]/17/2021

## 2020-06-30 ENCOUNTER — Other Ambulatory Visit: Payer: Self-pay

## 2020-06-30 ENCOUNTER — Telehealth: Payer: Self-pay

## 2020-06-30 ENCOUNTER — Other Ambulatory Visit: Payer: Self-pay | Admitting: Oncology

## 2020-06-30 DIAGNOSIS — Z95828 Presence of other vascular implants and grafts: Secondary | ICD-10-CM

## 2020-06-30 LAB — CBC
HCT: 30.5 % — ABNORMAL LOW (ref 36.0–46.0)
Hemoglobin: 9.4 g/dL — ABNORMAL LOW (ref 12.0–15.0)
MCH: 25.8 pg — ABNORMAL LOW (ref 26.0–34.0)
MCHC: 30.8 g/dL (ref 30.0–36.0)
MCV: 83.6 fL (ref 80.0–100.0)
Platelets: 315 10*3/uL (ref 150–400)
RBC: 3.65 MIL/uL — ABNORMAL LOW (ref 3.87–5.11)
RDW: 13.4 % (ref 11.5–15.5)
WBC: 14.2 10*3/uL — ABNORMAL HIGH (ref 4.0–10.5)
nRBC: 0 % (ref 0.0–0.2)

## 2020-06-30 LAB — BASIC METABOLIC PANEL
Anion gap: 9 (ref 5–15)
BUN: 14 mg/dL (ref 6–20)
CO2: 31 mmol/L (ref 22–32)
Calcium: 8.8 mg/dL — ABNORMAL LOW (ref 8.9–10.3)
Chloride: 99 mmol/L (ref 98–111)
Creatinine, Ser: 0.42 mg/dL — ABNORMAL LOW (ref 0.44–1.00)
GFR, Estimated: >60 mL/min (ref >60)
Glucose, Bld: 127 mg/dL — ABNORMAL HIGH (ref 70–99)
Potassium: 4.2 mmol/L (ref 3.5–5.1)
Sodium: 139 mmol/L (ref 135–145)

## 2020-06-30 MED ORDER — LIDOCAINE-PRILOCAINE 2.5-2.5 % EX CREA: 1.0000 "application " | TOPICAL_CREAM | CUTANEOUS | 0 refills | Status: DC | PRN

## 2020-06-30 NOTE — Progress Notes (Signed)
Physical Therapy Treatment Patient Details Name: Jessica Willis MRN: [id number] DOB: Jun 08, 1966 Today's Date: 06/30/2020    History of Present Illness Jessica Willis is a 78yoF who comes to [facility] 11/3 c SOB and cough. Pt reports swelling of Rt breast for 6 months, noted to have a large breast mass, Rt sided pleural effusion c mass effect. Pt appears to have Right BrCA, admitted no s/p thoracentesis and chest tube placement.  PMH: anemia. Pt required [facility], moved to [facility] adn out of ICU on 11/12.    PT Comments    Patient alert, agreeable to PT, denied pain. She was able to perform supine <> sit with supervision, and session focused on ambulation. She ultimately able to ambulate ~41f in total with RW and supervision/CGA. Very slow, careful ambulation. spO2 monitored, with ambulation on 2L >90% throughout. HR in 120s with mobility.  Pt returned to bed with all needs in reach at end of session. Goals reassessed and updated per pt progress, as well as discharge plan updated to HHPT. Pt reported she will have near 24/7 assistance if needed.     Follow Up Recommendations  Home health PT;Supervision/Assistance - 24 hour     Equipment Recommendations  Rolling walker with 5" wheels    Recommendations for Other Services       Precautions / Restrictions Precautions Precautions: Fall Restrictions Weight Bearing Restrictions: No    Mobility  Bed Mobility Overal bed mobility: Needs Assistance Bed Mobility: Supine to Sit     Supine to sit: Supervision;HOB elevated Sit to supine: Supervision;HOB elevated      Transfers Overall transfer level: Needs assistance Equipment used: Rolling walker (2 wheeled) Transfers: Sit to/from Stand Sit to Stand: Min guard            Ambulation/Gait Ambulation/Gait assistance: Supervision Gait Distance (Feet):  (164f and then additional 3465ffter a seated rest break) Assistive device: Rolling walker (2 wheeled)       General  Gait Details: very slow, carful ambulation. spO2 monitored, with ambulation on 2L >90% throughout. HR in 120s with mobility.   Stairs             Wheelchair Mobility    Modified Rankin (Stroke Patients Only)       Balance Overall balance assessment: Needs assistance Sitting-balance support: Feet supported Sitting balance-Leahy Scale: Good     Standing balance support: Bilateral upper extremity supported Standing balance-Leahy Scale: Fair                              Cognition Arousal/Alertness: Awake/alert Behavior During Therapy: WFL for tasks assessed/performed Overall Cognitive Status: Within Functional Limits for tasks assessed                                        Exercises      General Comments General comments (skin integrity, edema, etc.): on 2L via Speed throughout session      Pertinent Vitals/Pain Pain Assessment: No/denies pain    Home Living                      Prior Function            PT Goals (current goals can now be found in the care plan section) Progress towards PT goals: Progressing toward goals    Frequency  Min 2X/week      PT Plan Discharge plan needs to be updated    Co-evaluation              AM-PAC PT "6 Clicks" Mobility   Outcome Measure  Help needed turning from your back to your side while in a flat bed without using bedrails?: None Help needed moving from lying on your back to sitting on the side of a flat bed without using bedrails?: None Help needed moving to and from a bed to a chair (including a wheelchair)?: None Help needed standing up from a chair using your arms (e.g., wheelchair or bedside chair)?: None Help needed to walk in hospital room?: A Little Help needed climbing 3-5 steps with a railing? : A Little 6 Click Score: 22    End of Session Equipment Utilized During Treatment: Gait belt;Oxygen (2L) Activity Tolerance: Patient tolerated treatment  well Patient left: in bed;with call bell/phone within reach Nurse Communication: Mobility status PT Visit Diagnosis: Unsteadiness on feet (R26.81);Difficulty in walking, not elsewhere classified (R26.2);Muscle weakness (generalized) (M62.81)     Time: 1010-1040 PT Time Calculation (min) (ACUTE ONLY): 30 min  Charges:  $Therapeutic Exercise: 23-37 mins                     Lieutenant Diego PT, DPT 12:27 PM,06/30/20

## 2020-06-30 NOTE — Progress Notes (Signed)
Pt ambulated in the room on RA with O2 sat at 99% at rest, O2 sat decreased to 91 at most while ambulating. Afterwards, pt recovered to 94%. Pt remains on room air in stable condition

## 2020-06-30 NOTE — Progress Notes (Signed)
Hematology/Oncology Progress Note [facility] [phone] [phone]  Patient Care Team: System, Provider Not In as PCP - General Rico Junker, RN as Registered Nurse   Name of the patient: Jessica Willis  [id number]  October 01, 1965  Date of visit: 06/30/20   INTERVAL HISTORY-  Patient is on nasal cannula oxygen 2 L.  S/p cycle 1 D1 taxol chemotherapy yesterday.  No nausea vomiting diarrhea.   Review of systems- Review of Systems  Constitutional: Positive for fatigue. Negative for appetite change.  Eyes: Negative for eye problems.  Respiratory: Positive for shortness of breath. Negative for cough.   Cardiovascular: Negative for leg swelling.  Gastrointestinal: Negative for abdominal pain.  Genitourinary: Negative for dysuria.   Musculoskeletal: Negative for back pain and neck pain.  Skin: Negative for rash.  Neurological: Negative for light-headedness.  Psychiatric/Behavioral: Negative for confusion.    No Known Allergies  Patient Active Problem List   Diagnosis Date Noted  . Bone metastasis ([place])   . Hypocalcemia   . Folate deficiency   . Gastroesophageal reflux disease without esophagitis   . Weakness of right arm   . Palliative care encounter   . Iron deficiency anemia   . Anemia, chronic disease   . Tachycardia   . Metastatic breast cancer ([place])   . Malignant pleural effusion   . Chest tube in place   . Goals of care, counseling/discussion   . Hemothorax on right 06/16/2020  . Acute respiratory failure with hypoxia ([place])   . Breast mass, right 06/15/2020  . Mastitis 06/15/2020  . Pleural effusion 06/15/2020  . Intestinal obstruction ([place])   . Leukocytosis   . Hypokalemia 06/24/2015  . Sepsis ([place]) 06/23/2015  . CAP (community acquired pneumonia) 06/23/2015  . Partial small bowel obstruction ([place]) 06/23/2015     Past Medical History:  Diagnosis Date  . Anemia   . Patient denies medical problems       Past Surgical History:  Procedure Laterality Date  . BREAST BIOPSY  06/28/2020   Procedure: BREAST BIOPSY;  Surgeon: Benjamine Sprague, DO;  Location: [facility] ORS;  Service: General;;  . PORTACATH PLACEMENT N/A 06/28/2020   Procedure: INSERTION PORT-A-CATH;  Surgeon: Benjamine Sprague, DO;  Location: [facility] ORS;  Service: General;  Laterality: N/A;  . TUBAL LIGATION    . VIDEO ASSISTED THORACOSCOPY (VATS)/THOROCOTOMY Right 06/23/2020   Procedure: ATTEMPTED VIDEO ASSISTED THORACOSCOPY (VATS);  Surgeon: Nestor Lewandowsky, MD;  Location: [facility] ORS;  Service: General;  Laterality: Right;    Social History   Socioeconomic History  . Marital status: Single    Spouse name: Not on file  . Number of children: Not on file  . Years of education: Not on file  . Highest education level: Not on file  Occupational History  . Not on file  Tobacco Use  . Smoking status: Never Smoker  . Smokeless tobacco: Never Used  Substance and Sexual Activity  . Alcohol use: Not Currently    Alcohol/week: 1.0 standard drink    Types: 1 Cans of beer per week  . Drug use: No  . Sexual activity: Not on file  Other Topics Concern  . Not on file  Social History Narrative  . Not on file   Social Determinants of Health   Financial Resource Strain:   . Difficulty of Paying Living Expenses: Not on file  Food Insecurity:   . Worried About Charity fundraiser in the Last Year: Not on file  . Ran Out  of Food in the Last Year: Not on file  Transportation Needs:   . Lack of Transportation (Medical): Not on file  . Lack of Transportation (Non-Medical): Not on file  Physical Activity:   . Days of Exercise per Week: Not on file  . Minutes of Exercise per Session: Not on file  Stress:   . Feeling of Stress : Not on file  Social Connections:   . Frequency of Communication with Friends and Family: Not on file  . Frequency of Social Gatherings with Friends and Family: Not on file  . Attends Religious Services: Not on file  .  Active Member of Clubs or Organizations: Not on file  . Attends Archivist Meetings: Not on file  . Marital Status: Not on file  Intimate Partner Violence:   . Fear of Current or Ex-Partner: Not on file  . Emotionally Abused: Not on file  . Physically Abused: Not on file  . Sexually Abused: Not on file     Family History  Problem Relation Age of Onset  . Diabetes Other   . Hypertension Other      Current Facility-Administered Medications:  .  acetaminophen (TYLENOL) tablet 650 mg, 650 mg, Oral, Q6H PRN, 650 mg at 06/22/20 1838 **OR** acetaminophen (TYLENOL) suppository 650 mg, 650 mg, Rectal, Q6H PRN, Sakai, Isami, DO .  budesonide (PULMICORT) nebulizer solution 0.25 mg, 0.25 mg, Nebulization, BID, Sakai, Isami, DO, 0.25 mg at 06/30/20 0816 .  calcium carbonate (OS-CAL - dosed in mg of elemental calcium) tablet 500 mg of elemental calcium, 1 tablet, Oral, Q breakfast, Sakai, Isami, DO, 500 mg of elemental calcium at 06/30/20 0827 .  Chlorhexidine Gluconate Cloth 2 % PADS 6 each, 6 each, Topical, Daily, Loletha Grayer, MD, 6 each at 06/30/20 0831 .  cholecalciferol (VITAMIN D3) tablet 1,000 Units, 1,000 Units, Oral, Daily, Sakai, Isami, DO, 1,000 Units at 06/30/20 0827 .  docusate sodium (COLACE) capsule 100 mg, 100 mg, Oral, BID, Sakai, Isami, DO, 100 mg at 06/30/20 0827 .  enoxaparin (LOVENOX) injection 40 mg, 40 mg, Subcutaneous, Q24H, Sakai, Isami, DO, 40 mg at 06/30/20 0827 .  feeding supplement (ENSURE ENLIVE / ENSURE PLUS) liquid 237 mL, 237 mL, Oral, BID BM, Sakai, Isami, DO, 237 mL at 06/30/20 0831 .  folic acid (FOLVITE) tablet 1 mg, 1 mg, Oral, Daily, Sakai, Isami, DO, 1 mg at 06/30/20 0827 .  ipratropium-albuterol (DUONEB) 0.5-2.5 (3) MG/3ML nebulizer solution 3 mL, 3 mL, Nebulization, BID, Sakai, Isami, DO, 3 mL at 06/30/20 0816 .  lactated ringers infusion, , Intravenous, Continuous, Sakai, Isami, DO, Last Rate: 555 mL/hr at 06/28/20 1308, New Bag at 06/28/20  1308 .  lactulose (CHRONULAC) 10 GM/15ML solution 30 g, 30 g, Oral, Daily PRN, Sakai, Isami, DO .  LORazepam (ATIVAN) injection 1 mg, 1 mg, Intravenous, Q6H PRN, Sakai, Isami, DO, 1 mg at 06/20/20 2131 .  MEDLINE mouth rinse, 15 mL, Mouth Rinse, BID, Sakai, Isami, DO, 15 mL at 06/30/20 0831 .  melatonin tablet 5 mg, 5 mg, Oral, QHS, Sakai, Isami, DO, 5 mg at 06/29/20 2116 .  methylPREDNISolone sodium succinate (SOLU-MEDROL) 40 mg/mL injection 20 mg, 20 mg, Intravenous, BID, Sakai, Isami, DO, 20 mg at 06/30/20 0825 .  metoprolol tartrate (LOPRESSOR) injection 5 mg, 5 mg, Intravenous, Q6H PRN, Sakai, Isami, DO, 5 mg at 06/21/20 2049 .  morphine 2 MG/ML injection 1-2 mg, 1-2 mg, Intravenous, Q4H PRN, Sakai, Isami, DO, 2 mg at 06/24/20 0910 .  multivitamin with minerals  tablet 1 tablet, 1 tablet, Oral, Daily, Sakai, Isami, DO, 1 tablet at 06/30/20 8453 .  ondansetron (ZOFRAN) tablet 4 mg, 4 mg, Oral, Q6H PRN, 4 mg at 06/25/20 1014 **OR** ondansetron (ZOFRAN) injection 4 mg, 4 mg, Intravenous, Q6H PRN, Sakai, Isami, DO, 4 mg at 06/21/20 1116 .  ondansetron (ZOFRAN) tablet 8 mg, 8 mg, Oral, Q8H PRN, Sakai, Isami, DO .  pantoprazole (PROTONIX) EC tablet 40 mg, 40 mg, Oral, Daily, Sakai, Isami, DO, 40 mg at 06/30/20 0827 .  polyethylene glycol (MIRALAX / GLYCOLAX) packet 17 g, 17 g, Oral, Daily, Sakai, Isami, DO, 17 g at 06/28/20 1545 .  simethicone (MYLICON) chewable tablet 80 mg, 80 mg, Oral, QID PRN, Sakai, Isami, DO, 80 mg at 06/25/20 1014 .  traMADol (ULTRAM) tablet 50 mg, 50 mg, Oral, Q6H PRN, Sakai, Isami, DO, 50 mg at 06/19/20 1401 .  traZODone (DESYREL) tablet 50 mg, 50 mg, Oral, QHS PRN, Lysle Pearl, Isami, DO, 50 mg at 06/24/20 2046   Physical exam:  Vitals:   06/30/20 0038 06/30/20 0528 06/30/20 1131 06/30/20 1149  BP: 113/75 124/79 125/64 112/67  Pulse: 87 96 (!) 107 (!) 103  Resp: _0 Temp: 98.2 F (36.8 C) 98.4 F (36.9 C) 98.1 F (36.7 C) 97.7 F (36.5 C)  TempSrc:    Oral   SpO2: 100% 100% 100%   Weight:      Height:       Physical Exam Constitutional:      General: She is not in acute distress.    Appearance: She is not diaphoretic.  HENT:     Head: Normocephalic and atraumatic.     Nose: Nose normal.     Mouth/Throat:     Pharynx: No oropharyngeal exudate.  Eyes:     General: No scleral icterus.    Pupils: Pupils are equal, round, and reactive to light.  Cardiovascular:     Rate and Rhythm: Normal rate and regular rhythm.     Heart sounds: No murmur heard.   Pulmonary:     Effort: No respiratory distress.     Breath sounds: No rales.     Comments: Decreased breath sound on the right. She breathes comfortably via nasal cannula oxygen Chest:     Chest wall: No tenderness.  Abdominal:     General: There is no distension.     Palpations: Abdomen is soft.     Tenderness: There is no abdominal tenderness.  Musculoskeletal:        General: Normal range of motion.     Cervical back: Normal range of motion and neck supple.  Skin:    General: Skin is warm and dry.     Findings: No erythema.  Neurological:     Mental Status: She is alert and oriented to person, place, and time.     Cranial Nerves: No cranial nerve deficit.     Motor: No abnormal muscle tone.     Coordination: Coordination normal.  Psychiatric:        Mood and Affect: Affect normal.    right breast mass involving skin      CMP Latest Ref Rng & Units 06/30/2020  Glucose 70 - 99 mg/dL 127(H)  BUN 6 - 20 mg/dL 14  Creatinine 0.44 - 1.00 mg/dL 0.42(L)  Sodium 135 - 145 mmol/L 139  Potassium 3.5 - 5.1 mmol/L 4.2  Chloride 98 - 111 mmol/L 99  CO2 22 - 32 mmol/L 31  Calcium 8.9 - 10.3  mg/dL 8.8(L)  Total Protein 6.5 - 8.1 g/dL -  Total Bilirubin 0.3 - 1.2 mg/dL -  Alkaline Phos 38 - 126 U/L -  AST 15 - 41 U/L -  ALT 0 - 44 U/L -   CBC Latest Ref Rng & Units 06/30/2020  WBC 4.0 - 10.5 K/uL 14.2(H)  Hemoglobin 12.0 - 15.0 g/dL 9.4(L)  Hematocrit 36 - 46 % 30.5(L)   Platelets 150 - 400 K/uL 315    RADIOGRAPHIC STUDIES: I have personally reviewed the radiological images as listed and agreed with the findings in the report. DG Chest 1 View  Result Date: 06/28/2020 CLINICAL DATA:  Status port MediPort catheter placement EXAM: CHEST  1 VIEW COMPARISON:  June 23, 2020 FINDINGS: The heart size and mediastinal contours are unchanged with mild cardiomegaly. There is prominence of the central pulmonary vasculature. Again noted is patchy airspace opacity throughout the right lung with a probable loculated moderate pleural effusion unchanged from prior exam. Left perihilar hazy airspace opacity is noted. A left-sided MediPort catheter seen with the tip at the right atrium. No acute osseous abnormality. IMPRESSION: Left MediPort catheter tip just at the right atrium. No other change in the bilateral perihilar and patchy airspace opacities Loculated right pleural effusion Electronically Signed   By: Prudencio Pair M.D.   On: 06/28/2020 15:31   DG Chest 1 View  Result Date: 06/16/2020 CLINICAL DATA:  Hemothorax on the right. EXAM: CHEST  1 VIEW COMPARISON:  Same day chest radiograph. FINDINGS: A right-sided pleural pigtail catheter is redemonstrated. There is interval improvement in aeration of the right lung with decreased size of a right hemothorax. A small right pneumothorax is not significantly changed in size. Severe right atelectasis/airspace disease is redemonstrated. Mild interstitial opacities in the left lung appear unchanged. There is no left pleural effusion or left pneumothorax. The cardiac silhouette is partially obscured but appears unchanged. IMPRESSION: 1. Interval improvement in aeration of the right lung with decreased size of a right hemothorax. 2. Unchanged small right pneumothorax with pleural pigtail catheter in place. 3. Severe right atelectasis/airspace disease. Electronically Signed   By: Zerita Boers M.D.   On: 06/16/2020 14:52   DG Chest 1  View  Result Date: 06/16/2020 CLINICAL DATA:  Cough and phlegm production. EXAM: CHEST  1 VIEW COMPARISON:  Chest radiograph dated 06/15/2020. FINDINGS: The cardiac silhouette is obscured. A right-sided pleural pigtail catheter is in unchanged location and there has been interval decrease in a large right pleural fluid collection (effusion versus hemothorax). There is no longer mediastinal shift to the left. There is a small right pneumothorax. Severe airspace and interstitial opacities are seen on the right. Mild interstitial opacities are seen on the left. There is no left pleural effusion or pneumothorax. The osseous structures are intact. IMPRESSION: 1. Small right pneumothorax with a right-sided pleural pigtail catheter in place. 2. Interval decrease in a large right pleural fluid collection (effusion versus hemothorax). 3. Severe airspace and interstitial opacities on the right. Mild interstitial opacities in the left. Electronically Signed   By: Zerita Boers M.D.   On: 06/16/2020 14:28   DG Chest 1 View  Result Date: 06/15/2020 CLINICAL DATA:  Short of breath, cough EXAM: CHEST  1 VIEW COMPARISON:  06/29/2015 FINDINGS: 2 frontal views of the chest demonstrate complete opacification of the right hemithorax, with slight mediastinal shift to the left, consistent with large right pleural effusion and underlying lung consolidation. Chronic areas of scarring are seen within the left chest. No  pneumothorax. Cardiac silhouette is unremarkable. No acute bony abnormalities. IMPRESSION: 1. Opacification of the right hemithorax consistent with large right pleural effusion and underlying right lung consolidation. Electronically Signed   By: Randa Ngo M.D.   On: 06/15/2020 21:42   CT Angio Chest PE W and/or Wo Contrast  Result Date: 06/15/2020 CLINICAL DATA:  Cough, shortness of breath EXAM: CT ANGIOGRAPHY CHEST WITH CONTRAST TECHNIQUE: Multidetector CT imaging of the chest was performed using the standard  protocol during bolus administration of intravenous contrast. Multiplanar CT image reconstructions and MIPs were obtained to evaluate the vascular anatomy. CONTRAST:  100 mL Omnipaque 350 IV COMPARISON:  06/30/2015 FINDINGS: Cardiovascular: No filling defects in the pulmonary arteries to suggest pulmonary emboli. Heart is normal size. Aorta normal caliber. Mediastinum/Nodes: No visible mediastinal or hilar adenopathy. Bilateral axillary adenopathy, right greater than left. Index right axillary lymph node has a short axis diameter of 13 mm. Left axillary lymph node has a short axis diameter of 11 mm. Lungs/Pleura: Large right pleural effusion completely compressed seen in the right lung which is not aerated. This appears to have mass effect with shift of the right lung and mediastinal structures to the left. Areas of atelectasis or scarring in the left lung base. No effusion on the left. Upper Abdomen: Imaging into the upper abdomen demonstrates no acute findings. Musculoskeletal: Large mass noted in the right breast measuring 8 x 6 cm. Possible separate right breast mass more inferiorly in the right breast measuring 8 x 4 cm. Skin thickening or edema noted. Associated right axillary adenopathy. A few ill-defined sclerotic areas within the thoracic spine could reflect metastases. Review of the MIP images confirms the above findings. IMPRESSION: No evidence of pulmonary embolus. One, possibly 2 separate large masses within the right breast extending to the skin surface with associated adjacent skin thickening and right axillary adenopathy. Findings compatible with right breast cancer. Large right pleural effusion which appears to have mass effect with complete collapse/compression of the right lung and shift of mediastinal structures to the left. Single enlarged left axillary lymph node. These results were called by telephone at the time of interpretation on 06/15/2020 at 10:43 pm to provider [facility] , who  verbally acknowledged these results. Electronically Signed   By: Rolm Baptise M.D.   On: 06/15/2020 22:46   MR BRAIN W WO CONTRAST  Result Date: 06/24/2020 CLINICAL DATA:  History of metastatic breast cancer.  Staging. EXAM: MRI HEAD WITHOUT AND WITH CONTRAST TECHNIQUE: Multiplanar, multiecho pulse sequences of the brain and surrounding structures were obtained without and with intravenous contrast. CONTRAST:  16m GADAVIST GADOBUTROL 1 MMOL/ML IV SOLN COMPARISON:  None. FINDINGS: Brain: The brain has a normal appearance without evidence of malformation, atrophy, old or acute small or large vessel infarction, mass lesion, hemorrhage, hydrocephalus or extra-axial collection. After contrast administration, no enhancement the brain or leptomeninges occurs. Vascular: Major vessels at the base of the brain show flow. Venous sinuses appear patent. Skull and upper cervical spine: 1 cm metastasis affecting the clivus. Scattered metastases in the upper cervical spine. Sinuses/Orbits: Clear/normal. Other: None significant. IMPRESSION: 1. Normal appearance of the brain itself. No evidence of metastatic disease to the brain or leptomeninges. 2. 1 cm metastasis affecting the clivus. Scattered metastases in the upper cervical spine. Electronically Signed   By: MNelson ChimesM.D.   On: 06/24/2020 11:53   [place]Venous Img Lower Unilateral Left (DVT)  Result Date: 06/16/2020 CLINICAL DATA:  Initial evaluation for acute left lower extremity  edema. EXAM: LEFT LOWER EXTREMITY VENOUS DOPPLER ULTRASOUND TECHNIQUE: Gray-scale sonography with graded compression, as well as color Doppler and duplex ultrasound were performed to evaluate the lower extremity deep venous systems from the level of the common femoral vein and including the common femoral, femoral, profunda femoral, popliteal and calf veins including the posterior tibial, peroneal and gastrocnemius veins when visible. The superficial great saphenous vein was also interrogated.  Spectral Doppler was utilized to evaluate flow at rest and with distal augmentation maneuvers in the common femoral, femoral and popliteal veins. COMPARISON:  None. FINDINGS: Contralateral Common Femoral Vein: Respiratory phasicity is normal and symmetric with the symptomatic side. No evidence of thrombus. Normal compressibility. Common Femoral Vein: No evidence of thrombus. Normal compressibility, respiratory phasicity and response to augmentation. Saphenofemoral Junction: No evidence of thrombus. Normal compressibility and flow on color Doppler imaging. Profunda Femoral Vein: No evidence of thrombus. Normal compressibility and flow on color Doppler imaging. Femoral Vein: No evidence of thrombus. Normal compressibility, respiratory phasicity and response to augmentation. Popliteal Vein: No evidence of thrombus. Normal compressibility, respiratory phasicity and response to augmentation. Calf Veins: No evidence of thrombus. Normal compressibility and flow on color Doppler imaging. Superficial Great Saphenous Vein: No evidence of thrombus. Normal compressibility. Venous Reflux:  None. Other Findings:  None. IMPRESSION: No evidence of deep venous thrombosis. Electronically Signed   By: Jeannine Boga M.D.   On: 06/16/2020 01:30   DG Chest Port 1 View  Result Date: 06/23/2020 CLINICAL DATA:  Status post video-assisted thoracoscopy. EXAM: PORTABLE CHEST 1 VIEW COMPARISON:  June 22, 2020. FINDINGS: Stable cardiomegaly central pulmonary vascular congestion is noted. Right-sided chest tube has been removed. No pneumothorax is noted. Stable diffuse right lung opacity is noted concerning for pneumonia with associated pleural effusion, including loculated fluid and right lung apex. Small loculated left pleural effusion is noted laterally. Left perihilar and basilar opacity is noted concerning for infiltrate or possibly edema. Bony thorax is unremarkable. IMPRESSION: Stable diffuse right lung opacity is noted  concerning for pneumonia with associated pleural effusion, including loculated fluid and right lung apex. Small loculated left pleural effusion is noted laterally. Left perihilar and basilar opacity is noted concerning for infiltrate or possibly edema. Electronically Signed   By: Marijo Conception M.D.   On: 06/23/2020 14:59   DG Chest Port 1 View  Result Date: 06/22/2020 CLINICAL DATA:  Preoperative chest evaluation EXAM: PORTABLE CHEST 1 VIEW COMPARISON:  06/19/2020 FINDINGS: Chest tube remains in place on the right. Somewhat less pleural density on the right. There may be a small amount of air in the pleural space. Persistent diffuse infiltrate in the right lung. Patchy infiltrate in the mid and lower lung on the left is somewhat improved. IMPRESSION: 1. Chest tube remains in place on the right. Somewhat less pleural fluid. Question small amount of air in the pleural space. 2. Persistent diffuse infiltrate on the right. 3. Improved aeration in the left mid and lower lung. Electronically Signed   By: Nelson Chimes M.D.   On: 06/22/2020 14:43   DG Chest Port 1 View  Result Date: 06/19/2020 CLINICAL DATA:  Chest tube in place EXAM: PORTABLE CHEST 1 VIEW COMPARISON:  Chest radiograph from one day prior. FINDINGS: Right basilar pigtail chest tube is stable. Stable cardiomediastinal silhouette with mild cardiomegaly. No pneumothorax. Near complete opacification of the right hemithorax with minimally improved central right lung aeration. Extensive fluffy left parahilar lung opacities, slightly worsened. Left retrocardiac consolidation is stable. IMPRESSION: 1. Stable right  basilar chest tube without pneumothorax. 2. Near complete opacification of the right hemithorax with minimally improved central right lung aeration, which could be due to any combination of pleural effusion, atelectasis, pneumonia, edema or mass. 3. Stable left retrocardiac consolidation, favor atelectasis. 4. Fluffy left parahilar lung  opacities, slightly worsened, favor pulmonary edema. Electronically Signed   By: Ilona Sorrel M.D.   On: 06/19/2020 15:04   DG CHEST PORT 1 VIEW  Result Date: 06/18/2020 CLINICAL DATA:  Malignant pleural effusion, RIGHT-side chest tube EXAM: PORTABLE CHEST 1 VIEW COMPARISON:  Portable exam 1042 hours compared to 11 x 21 FINDINGS: Pigtail RIGHT thoracostomy tube again seen. Persistent subtotal opacification of the RIGHT hemithorax by effusion and atelectasis, underlying tumor not excluded. Atelectasis versus consolidation of LEFT lower lobe with probable small pleural effusion. Stable heart size. No acute osseous findings. IMPRESSION: Persistent subtotal opacification of the RIGHT hemithorax by effusion and atelectasis, underlying tumor not excluded. Persistent infiltrate and probable pleural effusion at inferior LEFT chest. Electronically Signed   By: Lavonia Dana M.D.   On: 06/18/2020 16:09   DG Chest Port 1 View  Result Date: 06/17/2020 CLINICAL DATA:  Chest x-ray 06/17/2020 5 a.m. EXAM: PORTABLE CHEST 1 VIEW COMPARISON:  Chest x-ray 06/17/2020 4:10 a.m. FINDINGS: Interval increase in size of a likely large right pleural effusion with slight aeration of the right lung. Likely trace pleural effusion on the left. Interval increase in airspace interstitial opacities of the left lung that is most prominent in the left mid lung zone. Right pigtail chest tube overlies the right hemithorax. IMPRESSION: 1. Slightly improved almost complete whiteout of the right chest with slightly decreased in size large pleural effusion. 2. Interval increase in interstitial and alveolar left lung opacities with possible trace left pleural effusion. Electronically Signed   By: Iven Finn M.D.   On: 06/17/2020 21:30   DG Chest Port 1 View  Result Date: 06/17/2020 CLINICAL DATA:  Pleural effusion EXAM: PORTABLE CHEST 1 VIEW COMPARISON:  Yesterday FINDINGS: More dense and homogeneous right sided opacity. Right pleural  catheter in stable position. Left lower lobe opacity which also appears increased. Likely no cardiomegaly when allowing for mediastinal shift. IMPRESSION: White out of the right chest with mediastinal mass effect from large pleural effusion. Retrocardiac infiltrate that could be infection or atelectasis. Electronically Signed   By: Monte Fantasia M.D.   On: 06/17/2020 05:00   DG Chest Portable 1 View  Result Date: 06/15/2020 CLINICAL DATA:  Right chest tube placement. EXAM: PORTABLE CHEST 1 VIEW COMPARISON:  Radiograph and CT earlier today. FINDINGS: Placement of right pigtail catheter. There is persistent complete opacification of the right hemithorax. There may be slight diminished mediastinal shift to the left. No visualized pneumothorax. No focal abnormality in the left lung. IMPRESSION: Placement of right pigtail catheter with persistent complete opacification of the right hemithorax. There may be slight diminished mediastinal shift to the left. No visualized pneumothorax. Electronically Signed   By: Keith Rake M.D.   On: 06/15/2020 23:40   DG C-Arm 1-60 Min-No Report  Result Date: 06/28/2020 Fluoroscopy was utilized by the requesting physician.  No radiographic interpretation.   MR TOTAL SPINE METS SCREENING  Result Date: 06/25/2020 CLINICAL DATA:  Metastatic breast cancer. EXAM: MRI TOTAL SPINE WITHOUT AND WITH CONTRAST TECHNIQUE: Multisequence MR imaging of the spine from the cervical spine to the sacrum was performed prior to and following IV contrast administration for evaluation of spinal metastatic disease. Large field of view sagittal imaging was  performed. No axial imaging was obtained. CONTRAST:  28m GADAVIST GADOBUTROL 1 MMOL/ML IV SOLN COMPARISON:  Head MRI 06/24/2020. Chest CTA 06/15/2020. CT abdomen and pelvis 06/25/2015. FINDINGS: MRI CERVICAL SPINE FINDINGS Alignment: Cervical spine straightening.  No significant listhesis. Vertebrae: Heterogeneous bone marrow signal  throughout the cervical spine with patchy areas of enhancement consistent with osseous metastatic disease. No evidence of epidural tumor. Cord: Normal signal.  No abnormal intradural enhancement. Posterior Fossa, vertebral arteries, paraspinal tissues: Posterior fossa more fully evaluated on the recent head MRI. No gross paraspinal mass on this study which does not include axial images. Disc levels: Moderate to severe disc space narrowing from C4-5 to C7-T1 with degenerative endplate changes, disc bulging, and spurring. Assessment for degenerative stenosis is limited in the absence of axial imaging. No compressive spinal stenosis. MRI THORACIC SPINE FINDINGS Alignment: Slight thoracic levoscoliosis. No significant listhesis. Vertebrae: Heterogeneous bone marrow signal throughout the thoracic spine with patchy areas of enhancement consistent with osseous metastatic disease. No evidence of epidural tumor. Cord:  Normal signal.  No abnormal intradural enhancement. Paraspinal and other soft tissues: Partially visualized known large right pleural effusion as well as a small left pleural effusion. Partially visualized known right breast mass. Disc levels: Mild-to-moderate diffuse thoracic disc degeneration with disc bulging. No compressive stenosis. MRI LUMBAR SPINE FINDINGS Segmentation:  Standard. Alignment:  Trace retrolisthesis of L2 on L3. Vertebrae: Heterogeneous bone marrow signal throughout the lumbar spine and sacrum with patchy areas of enhancement consistent with osseous metastatic disease. No evidence of epidural tumor. Conus medullaris: Extends to the L1 level and appears normal. Paraspinal and other soft tissues: No gross paraspinal mass on this study which does not include axial images. Disc levels: Focally advanced disc space narrowing at L2-3 with degenerative endplate changes. Mild disc bulging at L2-3, L3-4, and L4-5 without evidence of compressive stenosis. IMPRESSION: 1. Widespread osseous metastatic  disease. No evidence of epidural tumor. 2. Partial coverage of known right breast mass and large right and small left pleural effusions. Electronically Signed   By: ALogan BoresM.D.   On: 06/25/2020 15:12    Assessment and plan-   #Acute hypoxic respiratory failure, secondary to right pleural effusion and complete collapse of right lung. Status post thoracentesis and chest tube insertion.  Attempted VATS procedure with talc pleurodesis on 06/23/2020 but was not successful.  Chest tube was removed. Breathing status is stable.  Continue nasal cannula oxygen.  #Metastatic triple negative breast cancer, stage IV, with lung bone involvement. Patient will need outpatient PET scan to complete staging. -s/p Mediport placement-Breast mass biopsy for additional tissue for PD-L1 status and molecular testing. -s/p Cycle 1 D1 Taxol 878mm2 tolerated well.  -Recommend genetic testing.  She will meet genetic counselor outpatient on 07/04/2020. -Antiemetics Zofran 8 mg p.o. as needed for nausea.-Please give patient a prescription of Zofran upon discharge.  # anemia is multifactorial due to iron deficiency, folate deficiency,  as well as chronic disease., status post Venofer x 2.  Continue Folic acid.   #Bone metastasis, currently she denies any bone pain.  Discussed with her about rational of bisphosphonate.  Continue  calcium /vitamin d supplementation.  outpatient Zometa.  # Port A cath in place/   Continue PT/OT.  Disposition Home  Currently patient has an appointment with me tentatively on 07/04/2020 for posthospitalization follow-up, she has next cycle of Taxol chemotherapy scheduled on 11/24 2021.  She will also see additive care service, genetic counselor on 07/04/2020. thank you for allowing me to  participate in the care of this patient.   Earlie Server, MD, PhD Hematology Oncology [facility] Pager- [phone] 06/30/2020

## 2020-06-30 NOTE — Progress Notes (Signed)
PROGRESS NOTE    Jessica Willis  WUX:[id number] DOB: 1965-08-15 DOA: 06/15/2020 PCP: System, Provider Not In   Assessment & Plan:   Principal Problem:   Sepsis ([place]) Active Problems:   Hypokalemia   Breast mass, right   Mastitis   Pleural effusion   Hemothorax on right   Goals of care, counseling/discussion   Chest tube in place   Metastatic breast cancer ([place])   Malignant pleural effusion   Anemia, chronic disease   Tachycardia   Iron deficiency anemia   Weakness of right arm   Palliative care encounter   Gastroesophageal reflux disease without esophagitis   Folate deficiency   Bone metastasis (HCC)   Hypocalcemia    Stage IV metastatic breast cancer: through the skin of the chest wall, malignant right pleural effusion and widespread bone metastases.  Triple negative as per oncology.  S/p port placement 06/28/2020.  S/p incisional biopsy of right breast mass as per general surg on 06/28/20. Started chemo 06/30/20 as per onco  Large right malignant pleural effusion and hemothorax: initially had chest tube which has since been removed.  Dr. Genevive Bi attempted a VATS procedure on 06/23/2020 but this was unsuccessful & chest tube was removed at that time.  Acute hypoxic respiratory failure: weaned off of supplemental oxygen today.   Right arm weakness: MRI of the brain negative for metastases.  MRI of the spine did show widespread osseous metastatic disease.  GERD: continue on pantoprazole   Constipation: resolved  Iron deficiency anemia: s/p IV iron. Will start po iron   Sepsis: present on admission with pneumonia and right chest cellulitis. Completed abx course while inpatient   Generalized weakness: PT/OT recs home health   DVT prophylaxis: lovenox Code Status: full  Family Communication:  Disposition Plan: likely d/c home w/ home health   Status is: Inpatient  Remains inpatient appropriate because:Ongoing diagnostic testing needed not appropriate for  outpatient work up, IV treatments appropriate due to intensity of illness or inability to take PO and Inpatient level of care appropriate due to severity of illness   Dispo: The patient is from: Home              Anticipated d/c is to: Home w/ home health               Anticipated d/c date is: 1 day              Patient currently is not medically stable to d/c.     Consultants:   Onco  General surg     Procedures: S/p incisional biopsy of right breast mass & port placement    Antimicrobials:    Subjective: Pt c/o malaise   Objective: Vitals:   06/29/20 1958 06/29/20 2125 06/30/20 0038 06/30/20 0528  BP:  118/68 113/75 124/79  Pulse:  (!) 105 87 96  Resp:  18 16 18   Temp:  98.7 F (37.1 C) 98.2 F (36.8 C) 98.4 F (36.9 C)  TempSrc:      SpO2: 99% 99% 100% 100%  Weight:      Height:        Intake/Output Summary (Last 24 hours) at 06/30/2020 0829 Last data filed at 06/30/2020 0500 Gross per 24 hour  Intake 535.95 ml  Output 1200 ml  Net -664.05 ml   Filed Weights   06/15/20 2114 06/28/20 1247  Weight: 86.2 kg 86.2 kg    Examination:  General exam: Appears calm & comfortable Respiratory system: clear breath sounds b/l. No  wheezes or rales  Cardiovascular system: S1 & S2 +. No rubs, gallops or clicks.  Gastrointestinal system: Abdomen is nondistended, soft and nontender. Hypoactive bowel sounds heard. Central nervous system: Alert and oriented. Moves all 4 extremities  Psychiatry: Judgement and insight appear normal. Flat mood and affect   Data Reviewed: I have personally reviewed following labs and imaging studies  CBC: Recent Labs  Lab 06/24/20 0406 06/26/20 0605 06/28/20 1622 06/29/20 0617 06/30/20 0607  WBC 12.7* 13.7* 17.1* 18.3* 14.2*  NEUTROABS 11.0*  --   --  16.4*  --   HGB 8.7* 9.3* 10.2* 9.6* 9.4*  HCT 29.2* 31.1* 34.1* 31.7* 30.5*  MCV 84.1 83.8 84.4 83.2 83.6  PLT 392 373 389 345 242   Basic Metabolic Panel: Recent Labs   Lab 06/24/20 0406 06/26/20 0605 06/28/20 1622 06/29/20 0617 06/30/20 0607  NA 140 138  --  138 139  K 4.1 4.1  --  4.1 4.2  CL 98 94*  --  97* 99  CO2 33* 34*  --  32 31  GLUCOSE 135* 100*  --  115* 127*  BUN 13 10  --  11 14  CREATININE 0.51 0.45 0.49 0.40* 0.42*  CALCIUM 8.8* 8.5*  --  8.9 8.8*  MG 2.3  --   --   --   --   PHOS 2.8  --   --   --   --    GFR: Estimated Creatinine Clearance: 94.8 mL/min (A) (by C-G formula based on SCr of 0.42 mg/dL (L)). Liver Function Tests: Recent Labs  Lab 06/29/20 0617  AST 31  ALT 18  ALKPHOS 101  BILITOT 0.4  PROT 6.4*  ALBUMIN 2.8*   No results for input(s): LIPASE, AMYLASE in the last 168 hours. No results for input(s): AMMONIA in the last 168 hours. Coagulation Profile: No results for input(s): INR, PROTIME in the last 168 hours. Cardiac Enzymes: No results for input(s): CKTOTAL, CKMB, CKMBINDEX, TROPONINI in the last 168 hours. BNP (last 3 results) No results for input(s): PROBNP in the last 8760 hours. HbA1C: No results for input(s): HGBA1C in the last 72 hours. CBG: No results for input(s): GLUCAP in the last 168 hours. Lipid Profile: No results for input(s): CHOL, HDL, LDLCALC, TRIG, CHOLHDL, LDLDIRECT in the last 72 hours. Thyroid Function Tests: No results for input(s): TSH, T4TOTAL, FREET4, T3FREE, THYROIDAB in the last 72 hours. Anemia Panel: No results for input(s): VITAMINB12, FOLATE, FERRITIN, TIBC, IRON, RETICCTPCT in the last 72 hours. Sepsis Labs: No results for input(s): PROCALCITON, LATICACIDVEN in the last 168 hours.  Recent Results (from the past 240 hour(s))  Aerobic/Anaerobic Culture (surgical/deep wound)     Status: None   Collection Time: 06/20/20  3:20 PM   Specimen: Wound  Result Value Ref Range Status   Specimen Description   Final    WOUND Performed at [facility], [address] [place], [place] [address]    Special Requests   Final    RIGHT BREAST Performed at [facility], [address] [place], [place] [address]    Gram Stain   Final    RARE WBC PRESENT,BOTH PMN AND MONONUCLEAR NO ORGANISMS SEEN    Culture   Final    NORMAL SKIN FLORA NO ANAEROBES ISOLATED Performed at [facility], [address] [place],  [address]    Report Status 06/25/2020 FINAL  Final         Radiology Studies: DG Chest 1  View  Result Date: 06/28/2020 CLINICAL DATA:  Status port MediPort catheter placement EXAM: CHEST  1 VIEW COMPARISON:  June 23, 2020 FINDINGS: The heart size and mediastinal contours are unchanged with mild cardiomegaly. There is prominence of the central pulmonary vasculature. Again noted is patchy airspace opacity throughout the right lung with a probable loculated moderate pleural effusion unchanged from prior exam. Left perihilar hazy airspace opacity is noted. A left-sided MediPort catheter seen with the tip at the right atrium. No acute osseous abnormality. IMPRESSION: Left MediPort catheter tip just at the right atrium. No other change in the bilateral perihilar and patchy airspace opacities Loculated right pleural effusion Electronically Signed   By: Prudencio Pair M.D.   On: 06/28/2020 15:31   DG C-Arm 1-60 Min-No Report  Result Date: 06/28/2020 Fluoroscopy was utilized by the requesting physician.  No radiographic interpretation.        Scheduled Meds: . budesonide (PULMICORT) nebulizer solution  0.25 mg Nebulization BID  . calcium carbonate  1 tablet Oral Q breakfast  . Chlorhexidine Gluconate Cloth  6 each Topical Daily  . cholecalciferol  1,000 Units Oral Daily  . docusate sodium  100 mg Oral BID  . enoxaparin (LOVENOX) injection  40 mg Subcutaneous Q24H  . feeding supplement  237 mL Oral BID BM  . folic acid  1 mg Oral Daily  . ipratropium-albuterol  3 mL Nebulization BID  . mouth rinse  15 mL Mouth Rinse BID  . melatonin  5 mg Oral QHS  . methylPREDNISolone (SOLU-MEDROL) injection  20 mg Intravenous  BID  . multivitamin with minerals  1 tablet Oral Daily  . pantoprazole  40 mg Oral Daily  . polyethylene glycol  17 g Oral Daily   Continuous Infusions: . lactated ringers 555 mL/hr at 06/28/20 1308     LOS: 15 days    Time spent: 33 mins     Wyvonnia Dusky, MD [facility] Pager [phone]  If 7PM-7AM, please contact night-coverage 06/30/2020, 8:29 AM

## 2020-06-30 NOTE — Telephone Encounter (Signed)
Telephone call to patient for follow up after receiving first infusion.   Patient states infusion went great.  States eating good and drinking plenty of fluids.   Denies any nausea or vomiting.  Encouraged patient to call for any concerns or questions. 

## 2020-07-01 ENCOUNTER — Other Ambulatory Visit: Payer: Self-pay | Admitting: Oncology

## 2020-07-01 DIAGNOSIS — N6310 Unspecified lump in the right breast, unspecified quadrant: Secondary | ICD-10-CM

## 2020-07-01 DIAGNOSIS — C50919 Malignant neoplasm of unspecified site of unspecified female breast: Secondary | ICD-10-CM

## 2020-07-01 LAB — BASIC METABOLIC PANEL
Anion gap: 8 (ref 5–15)
BUN: 14 mg/dL (ref 6–20)
CO2: 30 mmol/L (ref 22–32)
Calcium: 8.8 mg/dL — ABNORMAL LOW (ref 8.9–10.3)
Chloride: 100 mmol/L (ref 98–111)
Creatinine, Ser: 0.54 mg/dL (ref 0.44–1.00)
GFR, Estimated: >60 mL/min (ref >60)
Glucose, Bld: 124 mg/dL — ABNORMAL HIGH (ref 70–99)
Potassium: 4.4 mmol/L (ref 3.5–5.1)
Sodium: 138 mmol/L (ref 135–145)

## 2020-07-01 LAB — CBC
HCT: 32.9 % — ABNORMAL LOW (ref 36.0–46.0)
Hemoglobin: 10.3 g/dL — ABNORMAL LOW (ref 12.0–15.0)
MCH: 25.8 pg — ABNORMAL LOW (ref 26.0–34.0)
MCHC: 31.3 g/dL (ref 30.0–36.0)
MCV: 82.3 fL (ref 80.0–100.0)
Platelets: 337 10*3/uL (ref 150–400)
RBC: 4 MIL/uL (ref 3.87–5.11)
RDW: 13.4 % (ref 11.5–15.5)
WBC: 12 10*3/uL — ABNORMAL HIGH (ref 4.0–10.5)
nRBC: 0 % (ref 0.0–0.2)

## 2020-07-01 MED ORDER — TRAMADOL HCL 50 MG PO TABS: 50.0000 mg | ORAL_TABLET | Freq: Four times a day (QID) | ORAL | 0 refills | Status: DC | PRN

## 2020-07-01 MED ORDER — PANTOPRAZOLE SODIUM 40 MG PO TBEC: 40.0000 mg | DELAYED_RELEASE_TABLET | Freq: Every day | ORAL | 0 refills | Status: DC

## 2020-07-01 MED ORDER — VITAMIN D3 25 MCG PO TABS: 1000.0000 [IU] | ORAL_TABLET | Freq: Every day | ORAL | 0 refills | Status: DC

## 2020-07-01 MED ORDER — ONDANSETRON HCL 8 MG PO TABS: 8.0000 mg | ORAL_TABLET | Freq: Three times a day (TID) | ORAL | 0 refills | Status: DC | PRN

## 2020-07-01 NOTE — Plan of Care (Signed)
Problem: Clinical Measurements: Goal: Ability to avoid or minimize complications of infection will improve 07/01/2020 1159 by Orvan Seen, RN Outcome: Completed/Met 07/01/2020 1158 by Orvan Seen, RN Outcome: Progressing   Problem: Skin Integrity: Goal: Skin integrity will improve 07/01/2020 1159 by Orvan Seen, RN Outcome: Completed/Met 07/01/2020 1158 by Orvan Seen, RN Outcome: Progressing   Problem: Education: Goal: Knowledge of General Education information will improve Description: Including pain rating scale, medication(s)/side effects and non-pharmacologic comfort measures 07/01/2020 1159 by Orvan Seen, RN Outcome: Completed/Met 07/01/2020 1158 by Orvan Seen, RN Outcome: Progressing   Problem: Health Behavior/Discharge Planning: Goal: Ability to manage health-related needs will improve 07/01/2020 1159 by Orvan Seen, RN Outcome: Completed/Met 07/01/2020 1158 by Orvan Seen, RN Outcome: Progressing   Problem: Clinical Measurements: Goal: Ability to maintain clinical measurements within normal limits will improve 07/01/2020 1159 by Orvan Seen, RN Outcome: Completed/Met 07/01/2020 1158 by Orvan Seen, RN Outcome: Progressing Goal: Will remain free from infection 07/01/2020 1159 by Orvan Seen, RN Outcome: Completed/Met 07/01/2020 1158 by Orvan Seen, RN Outcome: Progressing Goal: Diagnostic test results will improve 07/01/2020 1159 by Orvan Seen, RN Outcome: Completed/Met 07/01/2020 1158 by Orvan Seen, RN Outcome: Progressing Goal: Respiratory complications will improve 07/01/2020 1159 by Orvan Seen, RN Outcome: Completed/Met 07/01/2020 1158 by Orvan Seen, RN Outcome: Progressing Goal: Cardiovascular complication will be avoided 07/01/2020 1159 by Orvan Seen, RN Outcome: Completed/Met 07/01/2020 1158 by Orvan Seen, RN Outcome: Progressing   Problem:  Activity: Goal: Risk for activity intolerance will decrease 07/01/2020 1159 by Orvan Seen, RN Outcome: Completed/Met 07/01/2020 1158 by Orvan Seen, RN Outcome: Progressing   Problem: Nutrition: Goal: Adequate nutrition will be maintained 07/01/2020 1159 by Orvan Seen, RN Outcome: Completed/Met 07/01/2020 1158 by Orvan Seen, RN Outcome: Progressing   Problem: Coping: Goal: Level of anxiety will decrease 07/01/2020 1159 by Orvan Seen, RN Outcome: Completed/Met 07/01/2020 1158 by Orvan Seen, RN Outcome: Progressing   Problem: Elimination: Goal: Will not experience complications related to bowel motility 07/01/2020 1159 by Orvan Seen, RN Outcome: Completed/Met 07/01/2020 1158 by Orvan Seen, RN Outcome: Progressing Goal: Will not experience complications related to urinary retention 07/01/2020 1159 by Orvan Seen, RN Outcome: Completed/Met 07/01/2020 1158 by Orvan Seen, RN Outcome: Progressing   Problem: Pain Managment: Goal: General experience of comfort will improve 07/01/2020 1159 by Orvan Seen, RN Outcome: Completed/Met 07/01/2020 1158 by Orvan Seen, RN Outcome: Progressing   Problem: Safety: Goal: Ability to remain free from injury will improve 07/01/2020 1159 by Orvan Seen, RN Outcome: Completed/Met 07/01/2020 1158 by Orvan Seen, RN Outcome: Progressing   Problem: Skin Integrity: Goal: Risk for impaired skin integrity will decrease 07/01/2020 1159 by Orvan Seen, RN Outcome: Completed/Met 07/01/2020 1158 by Orvan Seen, RN Outcome: Progressing   Problem: Fluid Volume: Goal: Hemodynamic stability will improve 07/01/2020 1159 by Orvan Seen, RN Outcome: Completed/Met 07/01/2020 1158 by Orvan Seen, RN Outcome: Progressing   Problem: Clinical Measurements: Goal: Diagnostic test results will improve 07/01/2020 1159 by Orvan Seen, RN Outcome:  Completed/Met 07/01/2020 1158 by Orvan Seen, RN Outcome: Progressing Goal: Signs and symptoms of infection will decrease 07/01/2020 1159 by Orvan Seen, RN Outcome: Completed/Met 07/01/2020 1158 by Orvan Seen, RN Outcome: Progressing   Problem: Respiratory: Goal: Ability to maintain adequate ventilation will improve 07/01/2020 1159 by Orvan Seen,  RN Outcome: Completed/Met 07/01/2020 1158 by Orvan Seen, RN Outcome: Progressing

## 2020-07-01 NOTE — Discharge Instructions (Signed)
Wound care: Apply double-folded xeroform gauze to Right Breast once a day then cover with ABD Pads. Cut the crotch out of a pair of mesh underwear to create a tank top and use this to hold dressing in place. Avoid tape. Moisted dressing each time with NS to assist with removal.

## 2020-07-01 NOTE — TOC Transition Note (Signed)
Transition of Care [facility]) - CM/SW Discharge Note   Patient Details  Name: Kashawn Manzano MRN: [id number] Date of Birth: 04/16/1966  [facility] of [facility]) CM/SW Contact:  Beverly Sessions, RN Phone Number: 07/01/2020, 1:53 PM   Clinical Narrative:     Patient discharge today with home health services through [place].  Corene Cornea with [place] aware  Daughter transported at discharge Bedside RN to educate patient and daughter on dressing changes prior to discharge, and send some supplies  Patient to pick up her pain medications at [facility] faxed other prescriptions to [facility].  Patient to pick those up this afternoon  Patient did not qualify for home O2 at discharge   Final next level of care: [place] Barriers to Discharge: No Barriers Identified   Patient Goals and CMS Choice        Discharge Placement                       Discharge Plan and Services In-house Referral: Clinical Social Work Discharge Planning Services: CM Consult                      HH Arranged: RN, PT, OT, Social Work [employer] Agency: [place] ([place]) Date [place]: 07/01/20   Representative spoke with at [place]: [place] ([place]) Interventions     Readmission Risk Interventions No flowsheet data found.

## 2020-07-01 NOTE — Plan of Care (Signed)
  Problem: Clinical Measurements: Goal: Ability to avoid or minimize complications of infection will improve Outcome: Progressing   Problem: Education: Goal: Knowledge of General Education information will improve Description: Including pain rating scale, medication(s)/side effects and non-pharmacologic comfort measures Outcome: Progressing   Problem: Activity: Goal: Risk for activity intolerance will decrease Outcome: Progressing   Problem: Elimination: Goal: Will not experience complications related to bowel motility Outcome: Progressing Goal: Will not experience complications related to urinary retention Outcome: Progressing   Problem: Respiratory: Goal: Ability to maintain adequate ventilation will improve Outcome: Progressing   Problem: Clinical Measurements: Goal: Respiratory complications will improve Outcome: Progressing

## 2020-07-01 NOTE — Discharge Summary (Signed)
Physician Discharge Summary  Jessica Willis LHT:[id number] DOB: 1965-12-14 DOA: 06/15/2020  PCP: System, Provider Not In  Admit date: 06/15/2020 Discharge date: 07/01/2020  Admitted From: home  Disposition:  Home w/ home health   Recommendations for Outpatient Follow-up:  1. Follow up with PCP in 1-2 weeks 2. F/u oncology, Dr. Tasia Catchings, on 07/04/20  Home Health: yes Equipment/Devices:  Discharge Condition: stable  CODE STATUS: full  Diet recommendation: regular   Brief/Interim Summary: HPI was taken from Dr. Jonelle Sidle: Jessica Willis is a 54 y.o. female with medical history significant of anemia, appears to have medication noncompliance who has apparently had bone swelling of the right breast for at least 6 months but did not seek any medical attention.  Came in today with 1 week of progressive cough shortness of breath.  Patient also noted some weakness.  Patient came to the ER where she was evaluated.  She has a large right breast mass with a duplicate mass above the breast which looks necrotic with bullae and purulent discharge.  She additionally had CT that shows mass right-sided pleural effusion with mass-effect and shifting of the midline.  Near complete collapse of the right lung.  Patient also appears to have significant breast cancer.  She is evasive when it comes to history given.  Does not appear to be interested in giving much history.  She denies any family history of cancers including breast cancer.  Denied any prior diagnosis of any breast lesion.  Has not had any mammograms.  Patient being admitted now after thoracentesis in the ER with chest tube insertion.  Her right breast appears to have cellulitic changes with a purulent discharge leading to sepsis.  She appears to have malignant pleural effusion with right breast cancer..  ED Course: Temperature 98.2 blood pressure 180/107 pulse 144 respirate 38 oxygen sat 94% on room air.  White count 15.1 hemoglobin 11.4 and platelets  count of 565.  Sodium is 143 potassium 3.3 chloride 101 CO2 25 BUN 22 and creatinine was 0.61.  Lactic acid 2.8 glucose 146 and calcium 9.5.  CT angiogram of the chest shows no evidence of PE.  There was one possibily 2 separate large mass seen within the right breast extending to the skin surface was associated skin thickening or right axillary adenopathy.  Findings are consistent with right breast cancer.  Is also a large right pleural effusion with mass-effect with complete collapse of the right lung and shift of mediastinal structures the left.  There is a single enlarged left axillary lymph node.  Thoracentesis was done with insertion of pleural catheter in the ER.  Patient being admitted to the medical service for evaluation.   Hospital course from Dr. Lenise Herald 11/17-11/19/21: By the time I saw the pt, pt was s/p incisional biopsy of the right breast & port placement by general surg on 06/28/20. Pt did start chemo on 06/30/20 as per oncology. Also, Dr. Genevive Bi evidently attempted a VATS procedure on 06/23/20 but this was unsuccessful and chest tube was removed at that time as pt had a large right malignant pleural effusion & hemothorax. Pt did require supplemental oxygen while inpatient and was able to be weaned off prior to d/c. Also, PT/OT saw the pt initially recommended SNF but pt does not have insurance so pt was unable to go to SNF. Therapy continued to work the pt and then recommended home health. Home health was set up by CM prior to d/c. For more information, please previous progress/consult notes.  Discharge Diagnoses:  Principal Problem:   Sepsis ([place]) Active Problems:   Hypokalemia   Breast mass, right   Mastitis   Pleural effusion   Hemothorax on right   Goals of care, counseling/discussion   Chest tube in place   Metastatic breast cancer (HCC)   Malignant pleural effusion   Anemia, chronic disease   Tachycardia   Iron deficiency anemia   Weakness of right arm   Palliative  care encounter   Gastroesophageal reflux disease without esophagitis   Folate deficiency   Bone metastasis (HCC)   Hypocalcemia   Port-A-Cath in place  Stage IV metastatic breast cancer: through the skin of the chest wall, malignant right pleural effusion and widespread bone metastases. Triple negative as per oncology. S/p port placement 06/28/2020. S/p incisional biopsy of right breast mass as per general surg on 06/28/20. Started chemo 06/30/20 as per onco  Large right malignant pleural effusion and hemothorax: initially had chest tube which has since been removed. Dr. Genevive Bi attempted a VATS procedure on 06/23/2020 but this was unsuccessful & chest tube was removed at that time.  Acute hypoxic respiratory failure: weaned off of supplemental oxygen today.   Right arm weakness:MRI of the brain negative for metastases. MRI of the spine did show widespread osseous metastatic disease.  GERD: continue on pantoprazole   Constipation: resolved  Iron deficiency anemia: s/p IV iron. Will start po iron   Sepsis: present on admission with pneumonia and right chest cellulitis. Completed abx course while inpatient   Generalized weakness: PT/OT recs home health    Discharge Instructions  Discharge Instructions    Diet general   Complete by: As directed    Discharge instructions   Complete by: As directed    F/u PCP in 1-2 weeks. F/u onco, Dr. Tasia Catchings, within 5 days   Discharge wound care:   Complete by: As directed    As stated above   Increase activity slowly   Complete by: As directed      Allergies as of 07/01/2020   No Known Allergies     Medication List    TAKE these medications   lidocaine-prilocaine cream Commonly known as: EMLA Apply 1 application topically as needed. Place a small amount of cream to port site prior to each chemo treatment, 1-2 hours before treatment.   ondansetron 8 MG tablet Commonly known as: ZOFRAN Take 1 tablet (8 mg total) by mouth every  8 (eight) hours as needed for up to 14 days for nausea or vomiting.   pantoprazole 40 MG tablet Commonly known as: PROTONIX Take 1 tablet (40 mg total) by mouth daily.   traMADol 50 MG tablet Commonly known as: ULTRAM Take 1 tablet (50 mg total) by mouth every 6 (six) hours as needed for up to 5 days for moderate pain or severe pain.   Vitamin D3 25 MCG tablet Commonly known as: Vitamin D Take 1 tablet (1,000 Units total) by mouth daily.            Discharge Care Instructions  (From admission, onward)         Start     Ordered   07/01/20 0000  Discharge wound care:       Comments: As stated above   07/01/20 1015          Follow-up Information    Earlie Server, MD. Go on 07/04/2020.   Specialty: Oncology Why: 8:45am appointment Contact information: [address] [place] [address] [phone]  No Known Allergies  Consultations:  General surg  Oncology    Procedures/Studies: DG Chest 1 View  Result Date: 06/28/2020 CLINICAL DATA:  Status port MediPort catheter placement EXAM: CHEST  1 VIEW COMPARISON:  June 23, 2020 FINDINGS: The heart size and mediastinal contours are unchanged with mild cardiomegaly. There is prominence of the central pulmonary vasculature. Again noted is patchy airspace opacity throughout the right lung with a probable loculated moderate pleural effusion unchanged from prior exam. Left perihilar hazy airspace opacity is noted. A left-sided MediPort catheter seen with the tip at the right atrium. No acute osseous abnormality. IMPRESSION: Left MediPort catheter tip just at the right atrium. No other change in the bilateral perihilar and patchy airspace opacities Loculated right pleural effusion Electronically Signed   By: Prudencio Pair M.D.   On: 06/28/2020 15:31   DG Chest 1 View  Result Date: 06/16/2020 CLINICAL DATA:  Hemothorax on the right. EXAM: CHEST  1 VIEW COMPARISON:  Same day chest radiograph. FINDINGS: A  right-sided pleural pigtail catheter is redemonstrated. There is interval improvement in aeration of the right lung with decreased size of a right hemothorax. A small right pneumothorax is not significantly changed in size. Severe right atelectasis/airspace disease is redemonstrated. Mild interstitial opacities in the left lung appear unchanged. There is no left pleural effusion or left pneumothorax. The cardiac silhouette is partially obscured but appears unchanged. IMPRESSION: 1. Interval improvement in aeration of the right lung with decreased size of a right hemothorax. 2. Unchanged small right pneumothorax with pleural pigtail catheter in place. 3. Severe right atelectasis/airspace disease. Electronically Signed   By: Zerita Boers M.D.   On: 06/16/2020 14:52   DG Chest 1 View  Result Date: 06/16/2020 CLINICAL DATA:  Cough and phlegm production. EXAM: CHEST  1 VIEW COMPARISON:  Chest radiograph dated 06/15/2020. FINDINGS: The cardiac silhouette is obscured. A right-sided pleural pigtail catheter is in unchanged location and there has been interval decrease in a large right pleural fluid collection (effusion versus hemothorax). There is no longer mediastinal shift to the left. There is a small right pneumothorax. Severe airspace and interstitial opacities are seen on the right. Mild interstitial opacities are seen on the left. There is no left pleural effusion or pneumothorax. The osseous structures are intact. IMPRESSION: 1. Small right pneumothorax with a right-sided pleural pigtail catheter in place. 2. Interval decrease in a large right pleural fluid collection (effusion versus hemothorax). 3. Severe airspace and interstitial opacities on the right. Mild interstitial opacities in the left. Electronically Signed   By: Zerita Boers M.D.   On: 06/16/2020 14:28   DG Chest 1 View  Result Date: 06/15/2020 CLINICAL DATA:  Short of breath, cough EXAM: CHEST  1 VIEW COMPARISON:  06/29/2015 FINDINGS: 2  frontal views of the chest demonstrate complete opacification of the right hemithorax, with slight mediastinal shift to the left, consistent with large right pleural effusion and underlying lung consolidation. Chronic areas of scarring are seen within the left chest. No pneumothorax. Cardiac silhouette is unremarkable. No acute bony abnormalities. IMPRESSION: 1. Opacification of the right hemithorax consistent with large right pleural effusion and underlying right lung consolidation. Electronically Signed   By: Randa Ngo M.D.   On: 06/15/2020 21:42   CT Angio Chest PE W and/or Wo Contrast  Result Date: 06/15/2020 CLINICAL DATA:  Cough, shortness of breath EXAM: CT ANGIOGRAPHY CHEST WITH CONTRAST TECHNIQUE: Multidetector CT imaging of the chest was performed using the standard protocol during bolus administration of  intravenous contrast. Multiplanar CT image reconstructions and MIPs were obtained to evaluate the vascular anatomy. CONTRAST:  100 mL Omnipaque 350 IV COMPARISON:  06/30/2015 FINDINGS: Cardiovascular: No filling defects in the pulmonary arteries to suggest pulmonary emboli. Heart is normal size. Aorta normal caliber. Mediastinum/Nodes: No visible mediastinal or hilar adenopathy. Bilateral axillary adenopathy, right greater than left. Index right axillary lymph node has a short axis diameter of 13 mm. Left axillary lymph node has a short axis diameter of 11 mm. Lungs/Pleura: Large right pleural effusion completely compressed seen in the right lung which is not aerated. This appears to have mass effect with shift of the right lung and mediastinal structures to the left. Areas of atelectasis or scarring in the left lung base. No effusion on the left. Upper Abdomen: Imaging into the upper abdomen demonstrates no acute findings. Musculoskeletal: Large mass noted in the right breast measuring 8 x 6 cm. Possible separate right breast mass more inferiorly in the right breast measuring 8 x 4 cm. Skin  thickening or edema noted. Associated right axillary adenopathy. A few ill-defined sclerotic areas within the thoracic spine could reflect metastases. Review of the MIP images confirms the above findings. IMPRESSION: No evidence of pulmonary embolus. One, possibly 2 separate large masses within the right breast extending to the skin surface with associated adjacent skin thickening and right axillary adenopathy. Findings compatible with right breast cancer. Large right pleural effusion which appears to have mass effect with complete collapse/compression of the right lung and shift of mediastinal structures to the left. Single enlarged left axillary lymph node. These results were called by telephone at the time of interpretation on 06/15/2020 at 10:43 pm to provider [facility] , who verbally acknowledged these results. Electronically Signed   By: Rolm Baptise M.D.   On: 06/15/2020 22:46   MR BRAIN W WO CONTRAST  Result Date: 06/24/2020 CLINICAL DATA:  History of metastatic breast cancer.  Staging. EXAM: MRI HEAD WITHOUT AND WITH CONTRAST TECHNIQUE: Multiplanar, multiecho pulse sequences of the brain and surrounding structures were obtained without and with intravenous contrast. CONTRAST:  35mL GADAVIST GADOBUTROL 1 MMOL/ML IV SOLN COMPARISON:  None. FINDINGS: Brain: The brain has a normal appearance without evidence of malformation, atrophy, old or acute small or large vessel infarction, mass lesion, hemorrhage, hydrocephalus or extra-axial collection. After contrast administration, no enhancement the brain or leptomeninges occurs. Vascular: Major vessels at the base of the brain show flow. Venous sinuses appear patent. Skull and upper cervical spine: 1 cm metastasis affecting the clivus. Scattered metastases in the upper cervical spine. Sinuses/Orbits: Clear/normal. Other: None significant. IMPRESSION: 1. Normal appearance of the brain itself. No evidence of metastatic disease to the brain or leptomeninges. 2.  1 cm metastasis affecting the clivus. Scattered metastases in the upper cervical spine. Electronically Signed   By: Nelson Chimes M.D.   On: 06/24/2020 11:53   US Venous Img Lower Unilateral Left (DVT)  Result Date: 06/16/2020 CLINICAL DATA:  Initial evaluation for acute left lower extremity edema. EXAM: LEFT LOWER EXTREMITY VENOUS DOPPLER ULTRASOUND TECHNIQUE: Gray-scale sonography with graded compression, as well as color Doppler and duplex ultrasound were performed to evaluate the lower extremity deep venous systems from the level of the common femoral vein and including the common femoral, femoral, profunda femoral, popliteal and calf veins including the posterior tibial, peroneal and gastrocnemius veins when visible. The superficial great saphenous vein was also interrogated. Spectral Doppler was utilized to evaluate flow at rest and with distal augmentation maneuvers in  the common femoral, femoral and popliteal veins. COMPARISON:  None. FINDINGS: Contralateral Common Femoral Vein: Respiratory phasicity is normal and symmetric with the symptomatic side. No evidence of thrombus. Normal compressibility. Common Femoral Vein: No evidence of thrombus. Normal compressibility, respiratory phasicity and response to augmentation. Saphenofemoral Junction: No evidence of thrombus. Normal compressibility and flow on color Doppler imaging. Profunda Femoral Vein: No evidence of thrombus. Normal compressibility and flow on color Doppler imaging. Femoral Vein: No evidence of thrombus. Normal compressibility, respiratory phasicity and response to augmentation. Popliteal Vein: No evidence of thrombus. Normal compressibility, respiratory phasicity and response to augmentation. Calf Veins: No evidence of thrombus. Normal compressibility and flow on color Doppler imaging. Superficial Great Saphenous Vein: No evidence of thrombus. Normal compressibility. Venous Reflux:  None. Other Findings:  None. IMPRESSION: No evidence of deep  venous thrombosis. Electronically Signed   By: Jeannine Boga M.D.   On: 06/16/2020 01:30   DG Chest Port 1 View  Result Date: 06/23/2020 CLINICAL DATA:  Status post video-assisted thoracoscopy. EXAM: PORTABLE CHEST 1 VIEW COMPARISON:  June 22, 2020. FINDINGS: Stable cardiomegaly central pulmonary vascular congestion is noted. Right-sided chest tube has been removed. No pneumothorax is noted. Stable diffuse right lung opacity is noted concerning for pneumonia with associated pleural effusion, including loculated fluid and right lung apex. Small loculated left pleural effusion is noted laterally. Left perihilar and basilar opacity is noted concerning for infiltrate or possibly edema. Bony thorax is unremarkable. IMPRESSION: Stable diffuse right lung opacity is noted concerning for pneumonia with associated pleural effusion, including loculated fluid and right lung apex. Small loculated left pleural effusion is noted laterally. Left perihilar and basilar opacity is noted concerning for infiltrate or possibly edema. Electronically Signed   By: Marijo Conception M.D.   On: 06/23/2020 14:59   DG Chest Port 1 View  Result Date: 06/22/2020 CLINICAL DATA:  Preoperative chest evaluation EXAM: PORTABLE CHEST 1 VIEW COMPARISON:  06/19/2020 FINDINGS: Chest tube remains in place on the right. Somewhat less pleural density on the right. There may be a small amount of air in the pleural space. Persistent diffuse infiltrate in the right lung. Patchy infiltrate in the mid and lower lung on the left is somewhat improved. IMPRESSION: 1. Chest tube remains in place on the right. Somewhat less pleural fluid. Question small amount of air in the pleural space. 2. Persistent diffuse infiltrate on the right. 3. Improved aeration in the left mid and lower lung. Electronically Signed   By: Nelson Chimes M.D.   On: 06/22/2020 14:43   DG Chest Port 1 View  Result Date: 06/19/2020 CLINICAL DATA:  Chest tube in place EXAM:  PORTABLE CHEST 1 VIEW COMPARISON:  Chest radiograph from one day prior. FINDINGS: Right basilar pigtail chest tube is stable. Stable cardiomediastinal silhouette with mild cardiomegaly. No pneumothorax. Near complete opacification of the right hemithorax with minimally improved central right lung aeration. Extensive fluffy left parahilar lung opacities, slightly worsened. Left retrocardiac consolidation is stable. IMPRESSION: 1. Stable right basilar chest tube without pneumothorax. 2. Near complete opacification of the right hemithorax with minimally improved central right lung aeration, which could be due to any combination of pleural effusion, atelectasis, pneumonia, edema or mass. 3. Stable left retrocardiac consolidation, favor atelectasis. 4. Fluffy left parahilar lung opacities, slightly worsened, favor pulmonary edema. Electronically Signed   By: Ilona Sorrel M.D.   On: 06/19/2020 15:04   DG CHEST PORT 1 VIEW  Result Date: 06/18/2020 CLINICAL DATA:  Malignant pleural effusion, RIGHT-side  chest tube EXAM: PORTABLE CHEST 1 VIEW COMPARISON:  Portable exam 1042 hours compared to 11 x 21 FINDINGS: Pigtail RIGHT thoracostomy tube again seen. Persistent subtotal opacification of the RIGHT hemithorax by effusion and atelectasis, underlying tumor not excluded. Atelectasis versus consolidation of LEFT lower lobe with probable small pleural effusion. Stable heart size. No acute osseous findings. IMPRESSION: Persistent subtotal opacification of the RIGHT hemithorax by effusion and atelectasis, underlying tumor not excluded. Persistent infiltrate and probable pleural effusion at inferior LEFT chest. Electronically Signed   By: Lavonia Dana M.D.   On: 06/18/2020 16:09   DG Chest Port 1 View  Result Date: 06/17/2020 CLINICAL DATA:  Chest x-ray 06/17/2020 5 a.m. EXAM: PORTABLE CHEST 1 VIEW COMPARISON:  Chest x-ray 06/17/2020 4:10 a.m. FINDINGS: Interval increase in size of a likely large right pleural effusion with  slight aeration of the right lung. Likely trace pleural effusion on the left. Interval increase in airspace interstitial opacities of the left lung that is most prominent in the left mid lung zone. Right pigtail chest tube overlies the right hemithorax. IMPRESSION: 1. Slightly improved almost complete whiteout of the right chest with slightly decreased in size large pleural effusion. 2. Interval increase in interstitial and alveolar left lung opacities with possible trace left pleural effusion. Electronically Signed   By: Iven Finn M.D.   On: 06/17/2020 21:30   DG Chest Port 1 View  Result Date: 06/17/2020 CLINICAL DATA:  Pleural effusion EXAM: PORTABLE CHEST 1 VIEW COMPARISON:  Yesterday FINDINGS: More dense and homogeneous right sided opacity. Right pleural catheter in stable position. Left lower lobe opacity which also appears increased. Likely no cardiomegaly when allowing for mediastinal shift. IMPRESSION: White out of the right chest with mediastinal mass effect from large pleural effusion. Retrocardiac infiltrate that could be infection or atelectasis. Electronically Signed   By: Monte Fantasia M.D.   On: 06/17/2020 05:00   DG Chest Portable 1 View  Result Date: 06/15/2020 CLINICAL DATA:  Right chest tube placement. EXAM: PORTABLE CHEST 1 VIEW COMPARISON:  Radiograph and CT earlier today. FINDINGS: Placement of right pigtail catheter. There is persistent complete opacification of the right hemithorax. There may be slight diminished mediastinal shift to the left. No visualized pneumothorax. No focal abnormality in the left lung. IMPRESSION: Placement of right pigtail catheter with persistent complete opacification of the right hemithorax. There may be slight diminished mediastinal shift to the left. No visualized pneumothorax. Electronically Signed   By: Keith Rake M.D.   On: 06/15/2020 23:40   DG C-Arm 1-60 Min-No Report  Result Date: 06/28/2020 Fluoroscopy was utilized by the  requesting physician.  No radiographic interpretation.   MR TOTAL SPINE METS SCREENING  Result Date: 06/25/2020 CLINICAL DATA:  Metastatic breast cancer. EXAM: MRI TOTAL SPINE WITHOUT AND WITH CONTRAST TECHNIQUE: Multisequence MR imaging of the spine from the cervical spine to the sacrum was performed prior to and following IV contrast administration for evaluation of spinal metastatic disease. Large field of view sagittal imaging was performed. No axial imaging was obtained. CONTRAST:  63mL GADAVIST GADOBUTROL 1 MMOL/ML IV SOLN COMPARISON:  Head MRI 06/24/2020. Chest CTA 06/15/2020. CT abdomen and pelvis 06/25/2015. FINDINGS: MRI CERVICAL SPINE FINDINGS Alignment: Cervical spine straightening.  No significant listhesis. Vertebrae: Heterogeneous bone marrow signal throughout the cervical spine with patchy areas of enhancement consistent with osseous metastatic disease. No evidence of epidural tumor. Cord: Normal signal.  No abnormal intradural enhancement. Posterior Fossa, vertebral arteries, paraspinal tissues: Posterior fossa more fully evaluated on  the recent head MRI. No gross paraspinal mass on this study which does not include axial images. Disc levels: Moderate to severe disc space narrowing from C4-5 to C7-T1 with degenerative endplate changes, disc bulging, and spurring. Assessment for degenerative stenosis is limited in the absence of axial imaging. No compressive spinal stenosis. MRI THORACIC SPINE FINDINGS Alignment: Slight thoracic levoscoliosis. No significant listhesis. Vertebrae: Heterogeneous bone marrow signal throughout the thoracic spine with patchy areas of enhancement consistent with osseous metastatic disease. No evidence of epidural tumor. Cord:  Normal signal.  No abnormal intradural enhancement. Paraspinal and other soft tissues: Partially visualized known large right pleural effusion as well as a small left pleural effusion. Partially visualized known right breast mass. Disc levels:  Mild-to-moderate diffuse thoracic disc degeneration with disc bulging. No compressive stenosis. MRI LUMBAR SPINE FINDINGS Segmentation:  Standard. Alignment:  Trace retrolisthesis of L2 on L3. Vertebrae: Heterogeneous bone marrow signal throughout the lumbar spine and sacrum with patchy areas of enhancement consistent with osseous metastatic disease. No evidence of epidural tumor. Conus medullaris: Extends to the L1 level and appears normal. Paraspinal and other soft tissues: No gross paraspinal mass on this study which does not include axial images. Disc levels: Focally advanced disc space narrowing at L2-3 with degenerative endplate changes. Mild disc bulging at L2-3, L3-4, and L4-5 without evidence of compressive stenosis. IMPRESSION: 1. Widespread osseous metastatic disease. No evidence of epidural tumor. 2. Partial coverage of known right breast mass and large right and small left pleural effusions. Electronically Signed   By: Logan Bores M.D.   On: 06/25/2020 15:12      Subjective: Pt c/o fatigue    Discharge Exam: Vitals:   07/01/20 0854 07/01/20 1100  BP:  126/85  Pulse:  (!) 101  Resp:  16  Temp:  98.6 F (37 C)  SpO2: 93% 97%   Vitals:   07/01/20 0036 07/01/20 0506 07/01/20 0854 07/01/20 1100  BP: 111/73 103/68  126/85  Pulse: 92 93  (!) 101  Resp: 12 16  16   Temp: 98.2 F (36.8 C) (!) 97.1 F (36.2 C)  98.6 F (37 C)  TempSrc: Oral Oral  Oral  SpO2: 94% 93% 93% 97%  Weight:      Height:        General: Pt is alert, awake, not in acute distress Cardiovascular:  S1/S2 +, no rubs, no gallops Respiratory: diminished breath sounds b/l  Abdominal: Soft, NT, ND, bowel sounds + Extremities:  no cyanosis    The results of significant diagnostics from this hospitalization (including imaging, microbiology, ancillary and laboratory) are listed below for reference.     Microbiology: No results found for this or any previous visit (from the past 240 hour(s)).   Labs: BNP  (last 3 results) Recent Labs    06/15/20 2125  BNP 06.2   Basic Metabolic Panel: Recent Labs  Lab 06/26/20 0605 06/28/20 1622 06/29/20 0617 06/30/20 0607 07/01/20 0424  NA 138  --  138 139 138  K 4.1  --  4.1 4.2 4.4  CL 94*  --  97* 99 100  CO2 34*  --  32 31 30  GLUCOSE 100*  --  115* 127* 124*  BUN 10  --  11 14 14   CREATININE 0.45 0.49 0.40* 0.42* 0.54  CALCIUM 8.5*  --  8.9 8.8* 8.8*   Liver Function Tests: Recent Labs  Lab 06/29/20 0617  AST 31  ALT 18  ALKPHOS 101  BILITOT 0.4  PROT  6.4*  ALBUMIN 2.8*   No results for input(s): LIPASE, AMYLASE in the last 168 hours. No results for input(s): AMMONIA in the last 168 hours. CBC: Recent Labs  Lab 06/26/20 0605 06/28/20 1622 06/29/20 0617 06/30/20 0607 07/01/20 0424  WBC 13.7* 17.1* 18.3* 14.2* 12.0*  NEUTROABS  --   --  16.4*  --   --   HGB 9.3* 10.2* 9.6* 9.4* 10.3*  HCT 31.1* 34.1* 31.7* 30.5* 32.9*  MCV 83.8 84.4 83.2 83.6 82.3  PLT 373 389 345 315 337   Cardiac Enzymes: No results for input(s): CKTOTAL, CKMB, CKMBINDEX, TROPONINI in the last 168 hours. BNP: Invalid input(s): POCBNP CBG: No results for input(s): GLUCAP in the last 168 hours. D-Dimer No results for input(s): DDIMER in the last 72 hours. Hgb A1c No results for input(s): HGBA1C in the last 72 hours. Lipid Profile No results for input(s): CHOL, HDL, LDLCALC, TRIG, CHOLHDL, LDLDIRECT in the last 72 hours. Thyroid function studies No results for input(s): TSH, T4TOTAL, T3FREE, THYROIDAB in the last 72 hours.  Invalid input(s): FREET3 Anemia work up No results for input(s): VITAMINB12, FOLATE, FERRITIN, TIBC, IRON, RETICCTPCT in the last 72 hours. Urinalysis    Component Value Date/Time   COLORURINE YELLOW (A) 06/15/2020 2340   APPEARANCEUR CLEAR (A) 06/15/2020 2340   LABSPEC >1.046 (H) 06/15/2020 2340   PHURINE 5.0 06/15/2020 2340   GLUCOSEU NEGATIVE 06/15/2020 2340   HGBUR NEGATIVE 06/15/2020 2340   BILIRUBINUR NEGATIVE  06/15/2020 2340   KETONESUR 5 (A) 06/15/2020 2340   PROTEINUR NEGATIVE 06/15/2020 2340   NITRITE NEGATIVE 06/15/2020 2340   LEUKOCYTESUR NEGATIVE 06/15/2020 2340   Sepsis Labs Invalid input(s): PROCALCITONIN,  WBC,  LACTICIDVEN Microbiology No results found for this or any previous visit (from the past 240 hour(s)).   Time coordinating discharge: Over 30 minutes  SIGNED:   Wyvonnia Dusky, MD  [facility] 07/01/2020, 2:49 PM Pager   If 7PM-7AM, please contact night-coverage [email or web address]

## 2020-07-01 NOTE — Progress Notes (Signed)
Patient discharged to home with home health services accompanied by her daughter with all belongings and supplies for dressing changes.  Discharge instructions and AVS reviewed with patient and family.  All questions answered.  PIV removed, no bleeding noted, intact.  Signs and symptoms of infection reviewed with patient.   Patient satisfied with overall care at [facility].

## 2020-07-01 NOTE — Plan of Care (Signed)
  Problem: Clinical Measurements: Goal: Ability to avoid or minimize complications of infection will improve Outcome: Progressing   Problem: Skin Integrity: Goal: Skin integrity will improve Outcome: Progressing   Problem: Education: Goal: Knowledge of General Education information will improve Description: Including pain rating scale, medication(s)/side effects and non-pharmacologic comfort measures Outcome: Progressing   Problem: Health Behavior/Discharge Planning: Goal: Ability to manage health-related needs will improve Outcome: Progressing   Problem: Clinical Measurements: Goal: Ability to maintain clinical measurements within normal limits will improve Outcome: Progressing Goal: Will remain free from infection Outcome: Progressing Goal: Diagnostic test results will improve Outcome: Progressing Goal: Respiratory complications will improve Outcome: Progressing Goal: Cardiovascular complication will be avoided Outcome: Progressing   Problem: Activity: Goal: Risk for activity intolerance will decrease Outcome: Progressing   Problem: Nutrition: Goal: Adequate nutrition will be maintained Outcome: Progressing   Problem: Coping: Goal: Level of anxiety will decrease Outcome: Progressing   Problem: Elimination: Goal: Will not experience complications related to bowel motility Outcome: Progressing Goal: Will not experience complications related to urinary retention Outcome: Progressing   Problem: Pain Managment: Goal: General experience of comfort will improve Outcome: Progressing   Problem: Safety: Goal: Ability to remain free from injury will improve Outcome: Progressing   Problem: Skin Integrity: Goal: Risk for impaired skin integrity will decrease Outcome: Progressing   Problem: Fluid Volume: Goal: Hemodynamic stability will improve Outcome: Progressing   Problem: Clinical Measurements: Goal: Diagnostic test results will improve Outcome:  Progressing Goal: Signs and symptoms of infection will decrease Outcome: Progressing   Problem: Respiratory: Goal: Ability to maintain adequate ventilation will improve Outcome: Progressing

## 2020-07-01 NOTE — Progress Notes (Signed)
PT Cancellation Note  Patient Details Name: Jessica Willis MRN: [id number] DOB: 12/13/1965   Cancelled Treatment:    Reason Eval/Treat Not Completed: Other (comment) (Per pt, she waiting for her RN this AM to come and give her discharge papers. Family in room confirmed and eager to assist pt with going home today. Pt politely declined PT.)   Lieutenant Diego PT, DPT 9:18 AM,07/01/20

## 2020-07-04 ENCOUNTER — Encounter: Payer: Self-pay | Admitting: Oncology

## 2020-07-04 ENCOUNTER — Other Ambulatory Visit: Payer: Self-pay

## 2020-07-04 ENCOUNTER — Telehealth: Payer: Self-pay

## 2020-07-04 ENCOUNTER — Inpatient Hospital Stay: Payer: Medicaid Other

## 2020-07-04 ENCOUNTER — Inpatient Hospital Stay (HOSPITAL_BASED_OUTPATIENT_CLINIC_OR_DEPARTMENT_OTHER): Payer: Medicaid Other | Admitting: Oncology

## 2020-07-04 ENCOUNTER — Encounter: Payer: Self-pay | Admitting: Licensed Clinical Social Worker

## 2020-07-04 ENCOUNTER — Inpatient Hospital Stay: Payer: Medicaid Other | Admitting: Licensed Clinical Social Worker

## 2020-07-04 ENCOUNTER — Inpatient Hospital Stay (HOSPITAL_BASED_OUTPATIENT_CLINIC_OR_DEPARTMENT_OTHER): Payer: Medicaid Other | Admitting: Hospice and Palliative Medicine

## 2020-07-04 VITALS — BP 122/78 | HR 106 | Temp 98.6°F | Resp 16 | Wt 181.2 lb

## 2020-07-04 DIAGNOSIS — J910 Malignant pleural effusion: Secondary | ICD-10-CM | POA: Diagnosis not present

## 2020-07-04 DIAGNOSIS — C50919 Malignant neoplasm of unspecified site of unspecified female breast: Secondary | ICD-10-CM | POA: Diagnosis not present

## 2020-07-04 DIAGNOSIS — D509 Iron deficiency anemia, unspecified: Secondary | ICD-10-CM

## 2020-07-04 DIAGNOSIS — Z7189 Other specified counseling: Secondary | ICD-10-CM | POA: Diagnosis not present

## 2020-07-04 DIAGNOSIS — Z5111 Encounter for antineoplastic chemotherapy: Secondary | ICD-10-CM | POA: Diagnosis not present

## 2020-07-04 DIAGNOSIS — T451X5A Adverse effect of antineoplastic and immunosuppressive drugs, initial encounter: Secondary | ICD-10-CM

## 2020-07-04 DIAGNOSIS — Z515 Encounter for palliative care: Secondary | ICD-10-CM | POA: Diagnosis not present

## 2020-07-04 DIAGNOSIS — E538 Deficiency of other specified B group vitamins: Secondary | ICD-10-CM

## 2020-07-04 DIAGNOSIS — Z862 Personal history of diseases of the blood and blood-forming organs and certain disorders involving the immune mechanism: Secondary | ICD-10-CM | POA: Diagnosis not present

## 2020-07-04 DIAGNOSIS — Z803 Family history of malignant neoplasm of breast: Secondary | ICD-10-CM

## 2020-07-04 DIAGNOSIS — C7951 Secondary malignant neoplasm of bone: Secondary | ICD-10-CM | POA: Diagnosis not present

## 2020-07-04 DIAGNOSIS — C50911 Malignant neoplasm of unspecified site of right female breast: Secondary | ICD-10-CM | POA: Diagnosis present

## 2020-07-04 LAB — COMPREHENSIVE METABOLIC PANEL
ALT: 24 U/L (ref 0–44)
AST: 41 U/L (ref 15–41)
Albumin: 3.3 g/dL — ABNORMAL LOW (ref 3.5–5.0)
Alkaline Phosphatase: 106 U/L (ref 38–126)
Anion gap: 10 (ref 5–15)
BUN: 19 mg/dL (ref 6–20)
CO2: 26 mmol/L (ref 22–32)
Calcium: 8.8 mg/dL — ABNORMAL LOW (ref 8.9–10.3)
Chloride: 103 mmol/L (ref 98–111)
Creatinine, Ser: 0.59 mg/dL (ref 0.44–1.00)
GFR, Estimated: >60 mL/min (ref >60)
Glucose, Bld: 117 mg/dL — ABNORMAL HIGH (ref 70–99)
Potassium: 3.7 mmol/L (ref 3.5–5.1)
Sodium: 139 mmol/L (ref 135–145)
Total Bilirubin: 0.5 mg/dL (ref 0.3–1.2)
Total Protein: 7 g/dL (ref 6.5–8.1)

## 2020-07-04 LAB — CBC WITH DIFFERENTIAL/PLATELET
Abs Immature Granulocytes: 0.04 10*3/uL (ref 0.00–0.07)
Basophils Absolute: 0 10*3/uL (ref 0.0–0.1)
Basophils Relative: 0 %
Eosinophils Absolute: 0.1 10*3/uL (ref 0.0–0.5)
Eosinophils Relative: 1 %
HCT: 33.9 % — ABNORMAL LOW (ref 36.0–46.0)
Hemoglobin: 10.5 g/dL — ABNORMAL LOW (ref 12.0–15.0)
Immature Granulocytes: 0 %
Lymphocytes Relative: 12 %
Lymphs Abs: 1.1 10*3/uL (ref 0.7–4.0)
MCH: 25.5 pg — ABNORMAL LOW (ref 26.0–34.0)
MCHC: 31 g/dL (ref 30.0–36.0)
MCV: 82.5 fL (ref 80.0–100.0)
Monocytes Absolute: 0.4 10*3/uL (ref 0.1–1.0)
Monocytes Relative: 4 %
Neutro Abs: 7.5 10*3/uL (ref 1.7–7.7)
Neutrophils Relative %: 83 %
Platelets: 324 10*3/uL (ref 150–400)
RBC: 4.11 MIL/uL (ref 3.87–5.11)
RDW: 14.2 % (ref 11.5–15.5)
WBC: 9.2 10*3/uL (ref 4.0–10.5)
nRBC: 0 % (ref 0.0–0.2)

## 2020-07-04 MED ORDER — VITAMIN D3 25 MCG PO TABS: 1000.0000 [IU] | ORAL_TABLET | Freq: Every day | ORAL | 0 refills | Status: DC

## 2020-07-04 MED ORDER — LIDOCAINE-PRILOCAINE 2.5-2.5 % EX CREA: 1.0000 "application " | TOPICAL_CREAM | CUTANEOUS | 0 refills | Status: DC | PRN

## 2020-07-04 MED ORDER — FOLIC ACID 1 MG PO TABS: 1.0000 mg | ORAL_TABLET | Freq: Every day | ORAL | 0 refills | Status: DC

## 2020-07-04 MED ORDER — TRAMADOL HCL 50 MG PO TABS: 50.0000 mg | ORAL_TABLET | Freq: Four times a day (QID) | ORAL | 0 refills | Status: AC | PRN

## 2020-07-04 MED ORDER — CALCIUM CARBONATE 600 MG PO TABS: 1200.0000 mg | ORAL_TABLET | Freq: Every day | ORAL | 1 refills | Status: DC

## 2020-07-04 MED ORDER — PANTOPRAZOLE SODIUM 40 MG PO TBEC: 40.0000 mg | DELAYED_RELEASE_TABLET | Freq: Every day | ORAL | 0 refills | Status: DC

## 2020-07-04 MED ORDER — ONDANSETRON HCL 8 MG PO TABS: 8.0000 mg | ORAL_TABLET | Freq: Three times a day (TID) | ORAL | 0 refills | Status: AC | PRN

## 2020-07-04 NOTE — Addendum Note (Signed)
Addended by: Earlie Server on: 07/04/2020 10:11 PM   Modules accepted: Orders

## 2020-07-04 NOTE — Progress Notes (Signed)
Hematology/Oncology Follow Up Note [facility]  Telephone:([phone] [phone]  Patient Care Team: System, Provider Not In as PCP - General Rico Junker, RN as Registered Nurse   Name of the patient: Jessica Willis  [id number]  January 19, 1966   REASON FOR VISIT  follow-up for triple negative breast cancer  INTERVAL HISTORY 54 y.o. with past medical history including anemia, family history of breast cancer presents for follow-up of management of metastatic triple negative breast cancer.  06/15/2020-07/01/2020 Patient was admitted due to shortness of breath.  Also reported to have right breast cancer which she has noticed for about at least 6 months.  Initial x-ray and a CT showed massive right-sided pleural effusion , mass-effect and shift to the midline, near complete complex of the right lung, right breast mass extending to the skin surface associated with skin thickening and right axillary lymphadenopathy. Patient had initially pleural catheter placement later chest tube placement. Patient was admitted to ICU due to respiratory failure.  She was also seen by Dr. Genevive Bi initially and there was an attempt for tac pleurodesis which was not successful.  Chest tube was removed. Patient's respiratory status improved. Initial pleural effusion cytology came back positive for adenocarcinoma compatible with breast origin.  Triple negative due to lack of tissue for additional testing.  Patient admitted breast mass biopsy as well as Mediport placement by Dr. Lysle Pearl.  Patient was given first dose of Taxol 80 mg/m during her admission and she tolerated well. Patient was discharged on 07/01/2020.  Respiratory status improved and she was discharged home without home oxygen.  # MRI brain showed no intracranial brain metastasis.  1 cm metastasis affecting the clivus.  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  patient was accompanied by her son.  She reports  feeling well since discharge.  No shortness of breath.  Patient denies any pain.  She takes Tylenol if needed for discomforts at the right thoracentesis and breast biopsy sites.  She denies any headache, neck or back pain. Her appetite is fair.   Review of Systems  Constitutional: Positive for fatigue. Negative for appetite change, chills and fever.  HENT:   Negative for hearing loss and voice change.   Eyes: Negative for eye problems.  Respiratory: Negative for chest tightness, cough and shortness of breath.   Cardiovascular: Negative for chest pain.  Gastrointestinal: Negative for abdominal distention, abdominal pain and blood in stool.  Endocrine: Negative for hot flashes.  Genitourinary: Negative for difficulty urinating and frequency.   Musculoskeletal: Negative for arthralgias.  Skin: Negative for itching and rash.  Neurological: Negative for extremity weakness.  Hematological: Negative for adenopathy.  Psychiatric/Behavioral: Negative for confusion.      No Known Allergies   Past Medical History:  Diagnosis Date  . Anemia   . Family history of breast cancer   . Patient denies medical problems      Past Surgical History:  Procedure Laterality Date  . BREAST BIOPSY  06/28/2020   Procedure: BREAST BIOPSY;  Surgeon: Benjamine Sprague, DO;  Location: [facility] ORS;  Service: General;;  . PORTACATH PLACEMENT N/A 06/28/2020   Procedure: INSERTION PORT-A-CATH;  Surgeon: Benjamine Sprague, DO;  Location: [facility] ORS;  Service: General;  Laterality: N/A;  . TUBAL LIGATION    . VIDEO ASSISTED THORACOSCOPY (VATS)/THOROCOTOMY Right 06/23/2020   Procedure: ATTEMPTED VIDEO ASSISTED THORACOSCOPY (VATS);  Surgeon: Nestor Lewandowsky, MD;  Location: [facility] ORS;  Service: General;  Laterality: Right;    Social History  Socioeconomic History  . Marital status: Single    Spouse name: Not on file  . Number of children: Not on file  . Years of education: Not on file  . Highest education level: Not on file   Occupational History  . Not on file  Tobacco Use  . Smoking status: Never Smoker  . Smokeless tobacco: Never Used  Substance and Sexual Activity  . Alcohol use: Not Currently    Alcohol/week: 1.0 standard drink    Types: 1 Cans of beer per week  . Drug use: No  . Sexual activity: Not on file  Other Topics Concern  . Not on file  Social History Narrative  . Not on file   Social Determinants of Health   Financial Resource Strain:   . Difficulty of Paying Living Expenses: Not on file  Food Insecurity:   . Worried About Charity fundraiser in the Last Year: Not on file  . Ran Out of Food in the Last Year: Not on file  Transportation Needs:   . Lack of Transportation (Medical): Not on file  . Lack of Transportation (Non-Medical): Not on file  Physical Activity:   . Days of Exercise per Week: Not on file  . Minutes of Exercise per Session: Not on file  Stress:   . Feeling of Stress : Not on file  Social Connections:   . Frequency of Communication with Friends and Family: Not on file  . Frequency of Social Gatherings with Friends and Family: Not on file  . Attends Religious Services: Not on file  . Active Member of Clubs or Organizations: Not on file  . Attends Archivist Meetings: Not on file  . Marital Status: Not on file  Intimate Partner Violence:   . Fear of Current or Ex-Partner: Not on file  . Emotionally Abused: Not on file  . Physically Abused: Not on file  . Sexually Abused: Not on file    Family History  Problem Relation Age of Onset  . Diabetes Other   . Hypertension Other   . Diabetes Maternal Aunt   . Breast cancer Cousin        dx 103s  . Breast cancer Cousin        dx 23s     Current Outpatient Medications:  Marland Kitchen  Vitamin D3 (VITAMIN D) 25 MCG tablet, Take 1 tablet (1,000 Units total) by mouth daily., Disp: 30 tablet, Rfl: 0 .  calcium carbonate (CALCIUM 600) 600 MG TABS tablet, Take 2 tablets (1,200 mg total) by mouth daily with breakfast.,  Disp: 60 tablet, Rfl: 1 .  folic acid (FOLVITE) 1 MG tablet, Take 1 tablet (1 mg total) by mouth daily., Disp: 60 tablet, Rfl: 0 .  lidocaine-prilocaine (EMLA) cream, Apply 1 application topically as needed. Place a small amount of cream to port site prior to each chemo treatment, 1-2 hours before treatment., Disp: 30 g, Rfl: 0 .  ondansetron (ZOFRAN) 8 MG tablet, Take 1 tablet (8 mg total) by mouth every 8 (eight) hours as needed for up to 14 days for nausea or vomiting., Disp: 42 tablet, Rfl: 0 .  pantoprazole (PROTONIX) 40 MG tablet, Take 1 tablet (40 mg total) by mouth daily., Disp: 30 tablet, Rfl: 0 .  traMADol (ULTRAM) 50 MG tablet, Take 1 tablet (50 mg total) by mouth every 6 (six) hours as needed for up to 5 days for moderate pain or severe pain., Disp: 20 tablet, Rfl: 0  Physical exam:  Vitals:   07/04/20 0927  BP: 122/78  Pulse: (!) 106  Resp: 16  Temp: 98.6 F (37 C)  TempSrc: Tympanic  SpO2: 98%  Weight: 181 lb 3.2 oz (82.2 kg)   Physical Exam Constitutional:      General: She is not in acute distress.    Comments: Thin built  HENT:     Head: Normocephalic and atraumatic.  Eyes:     General: No scleral icterus. Cardiovascular:     Rate and Rhythm: Normal rate and regular rhythm.     Heart sounds: Normal heart sounds.  Pulmonary:     Effort: Pulmonary effort is normal. No respiratory distress.     Breath sounds: No wheezing.  Abdominal:     General: Bowel sounds are normal. There is no distension.     Palpations: Abdomen is soft.  Musculoskeletal:        General: No deformity. Normal range of motion.     Cervical back: Normal range of motion and neck supple.  Skin:    General: Skin is warm and dry.     Findings: No erythema or rash.     Comments: + Left anterior chest wall Mediport.  Neurological:     Mental Status: She is alert and oriented to person, place, and time. Mental status is at baseline.     Cranial Nerves: No cranial nerve deficit.      Coordination: Coordination normal.  Psychiatric:        Mood and Affect: Mood normal.   Large right breast mass with skin involvement.  CMP Latest Ref Rng & Units 07/04/2020  Glucose 70 - 99 mg/dL 117(H)  BUN 6 - 20 mg/dL 19  Creatinine 0.44 - 1.00 mg/dL 0.59  Sodium 135 - 145 mmol/L 139  Potassium 3.5 - 5.1 mmol/L 3.7  Chloride 98 - 111 mmol/L 103  CO2 22 - 32 mmol/L 26  Calcium 8.9 - 10.3 mg/dL 8.8(L)  Total Protein 6.5 - 8.1 g/dL 7.0  Total Bilirubin 0.3 - 1.2 mg/dL 0.5  Alkaline Phos 38 - 126 U/L 106  AST 15 - 41 U/L 41  ALT 0 - 44 U/L 24   CBC Latest Ref Rng & Units 07/04/2020  WBC 4.0 - 10.5 K/uL 9.2  Hemoglobin 12.0 - 15.0 g/dL 10.5(L)  Hematocrit 36 - 46 % 33.9(L)  Platelets 150 - 400 K/uL 324    RADIOGRAPHIC STUDIES: I have personally reviewed the radiological images as listed and agreed with the findings in the report. DG Chest 1 View  Result Date: 06/28/2020 CLINICAL DATA:  Status port MediPort catheter placement EXAM: CHEST  1 VIEW COMPARISON:  June 23, 2020 FINDINGS: The heart size and mediastinal contours are unchanged with mild cardiomegaly. There is prominence of the central pulmonary vasculature. Again noted is patchy airspace opacity throughout the right lung with a probable loculated moderate pleural effusion unchanged from prior exam. Left perihilar hazy airspace opacity is noted. A left-sided MediPort catheter seen with the tip at the right atrium. No acute osseous abnormality. IMPRESSION: Left MediPort catheter tip just at the right atrium. No other change in the bilateral perihilar and patchy airspace opacities Loculated right pleural effusion Electronically Signed   By: Prudencio Pair M.D.   On: 06/28/2020 15:31   DG Chest 1 View  Result Date: 06/16/2020 CLINICAL DATA:  Hemothorax on the right. EXAM: CHEST  1 VIEW COMPARISON:  Same day chest radiograph. FINDINGS: A right-sided pleural pigtail catheter is redemonstrated. There is interval improvement in  aeration of the right lung with decreased size of a right hemothorax. A small right pneumothorax is not significantly changed in size. Severe right atelectasis/airspace disease is redemonstrated. Mild interstitial opacities in the left lung appear unchanged. There is no left pleural effusion or left pneumothorax. The cardiac silhouette is partially obscured but appears unchanged. IMPRESSION: 1. Interval improvement in aeration of the right lung with decreased size of a right hemothorax. 2. Unchanged small right pneumothorax with pleural pigtail catheter in place. 3. Severe right atelectasis/airspace disease. Electronically Signed   By: Zerita Boers M.D.   On: 06/16/2020 14:52   DG Chest 1 View  Result Date: 06/16/2020 CLINICAL DATA:  Cough and phlegm production. EXAM: CHEST  1 VIEW COMPARISON:  Chest radiograph dated 06/15/2020. FINDINGS: The cardiac silhouette is obscured. A right-sided pleural pigtail catheter is in unchanged location and there has been interval decrease in a large right pleural fluid collection (effusion versus hemothorax). There is no longer mediastinal shift to the left. There is a small right pneumothorax. Severe airspace and interstitial opacities are seen on the right. Mild interstitial opacities are seen on the left. There is no left pleural effusion or pneumothorax. The osseous structures are intact. IMPRESSION: 1. Small right pneumothorax with a right-sided pleural pigtail catheter in place. 2. Interval decrease in a large right pleural fluid collection (effusion versus hemothorax). 3. Severe airspace and interstitial opacities on the right. Mild interstitial opacities in the left. Electronically Signed   By: Zerita Boers M.D.   On: 06/16/2020 14:28   DG Chest 1 View  Result Date: 06/15/2020 CLINICAL DATA:  Short of breath, cough EXAM: CHEST  1 VIEW COMPARISON:  06/29/2015 FINDINGS: 2 frontal views of the chest demonstrate complete opacification of the right hemithorax, with  slight mediastinal shift to the left, consistent with large right pleural effusion and underlying lung consolidation. Chronic areas of scarring are seen within the left chest. No pneumothorax. Cardiac silhouette is unremarkable. No acute bony abnormalities. IMPRESSION: 1. Opacification of the right hemithorax consistent with large right pleural effusion and underlying right lung consolidation. Electronically Signed   By: Randa Ngo M.D.   On: 06/15/2020 21:42   CT Angio Chest PE W and/or Wo Contrast  Result Date: 06/15/2020 CLINICAL DATA:  Cough, shortness of breath EXAM: CT ANGIOGRAPHY CHEST WITH CONTRAST TECHNIQUE: Multidetector CT imaging of the chest was performed using the standard protocol during bolus administration of intravenous contrast. Multiplanar CT image reconstructions and MIPs were obtained to evaluate the vascular anatomy. CONTRAST:  100 mL Omnipaque 350 IV COMPARISON:  06/30/2015 FINDINGS: Cardiovascular: No filling defects in the pulmonary arteries to suggest pulmonary emboli. Heart is normal size. Aorta normal caliber. Mediastinum/Nodes: No visible mediastinal or hilar adenopathy. Bilateral axillary adenopathy, right greater than left. Index right axillary lymph node has a short axis diameter of 13 mm. Left axillary lymph node has a short axis diameter of 11 mm. Lungs/Pleura: Large right pleural effusion completely compressed seen in the right lung which is not aerated. This appears to have mass effect with shift of the right lung and mediastinal structures to the left. Areas of atelectasis or scarring in the left lung base. No effusion on the left. Upper Abdomen: Imaging into the upper abdomen demonstrates no acute findings. Musculoskeletal: Large mass noted in the right breast measuring 8 x 6 cm. Possible separate right breast mass more inferiorly in the right breast measuring 8 x 4 cm. Skin thickening or edema noted. Associated right axillary adenopathy. A few ill-defined  sclerotic  areas within the thoracic spine could reflect metastases. Review of the MIP images confirms the above findings. IMPRESSION: No evidence of pulmonary embolus. One, possibly 2 separate large masses within the right breast extending to the skin surface with associated adjacent skin thickening and right axillary adenopathy. Findings compatible with right breast cancer. Large right pleural effusion which appears to have mass effect with complete collapse/compression of the right lung and shift of mediastinal structures to the left. Single enlarged left axillary lymph node. These results were called by telephone at the time of interpretation on 06/15/2020 at 10:43 pm to provider [facility] , who verbally acknowledged these results. Electronically Signed   By: Rolm Baptise M.D.   On: 06/15/2020 22:46   MR BRAIN W WO CONTRAST  Result Date: 06/24/2020 CLINICAL DATA:  History of metastatic breast cancer.  Staging. EXAM: MRI HEAD WITHOUT AND WITH CONTRAST TECHNIQUE: Multiplanar, multiecho pulse sequences of the brain and surrounding structures were obtained without and with intravenous contrast. CONTRAST:  57m GADAVIST GADOBUTROL 1 MMOL/ML IV SOLN COMPARISON:  None. FINDINGS: Brain: The brain has a normal appearance without evidence of malformation, atrophy, old or acute small or large vessel infarction, mass lesion, hemorrhage, hydrocephalus or extra-axial collection. After contrast administration, no enhancement the brain or leptomeninges occurs. Vascular: Major vessels at the base of the brain show flow. Venous sinuses appear patent. Skull and upper cervical spine: 1 cm metastasis affecting the clivus. Scattered metastases in the upper cervical spine. Sinuses/Orbits: Clear/normal. Other: None significant. IMPRESSION: 1. Normal appearance of the brain itself. No evidence of metastatic disease to the brain or leptomeninges. 2. 1 cm metastasis affecting the clivus. Scattered metastases in the upper cervical spine.  Electronically Signed   By: MNelson ChimesM.D.   On: 06/24/2020 11:53   UKoreaVenous Img Lower Unilateral Left (DVT)  Result Date: 06/16/2020 CLINICAL DATA:  Initial evaluation for acute left lower extremity edema. EXAM: LEFT LOWER EXTREMITY VENOUS DOPPLER ULTRASOUND TECHNIQUE: Gray-scale sonography with graded compression, as well as color Doppler and duplex ultrasound were performed to evaluate the lower extremity deep venous systems from the level of the common femoral vein and including the common femoral, femoral, profunda femoral, popliteal and calf veins including the posterior tibial, peroneal and gastrocnemius veins when visible. The superficial great saphenous vein was also interrogated. Spectral Doppler was utilized to evaluate flow at rest and with distal augmentation maneuvers in the common femoral, femoral and popliteal veins. COMPARISON:  None. FINDINGS: Contralateral Common Femoral Vein: Respiratory phasicity is normal and symmetric with the symptomatic side. No evidence of thrombus. Normal compressibility. Common Femoral Vein: No evidence of thrombus. Normal compressibility, respiratory phasicity and response to augmentation. Saphenofemoral Junction: No evidence of thrombus. Normal compressibility and flow on color Doppler imaging. Profunda Femoral Vein: No evidence of thrombus. Normal compressibility and flow on color Doppler imaging. Femoral Vein: No evidence of thrombus. Normal compressibility, respiratory phasicity and response to augmentation. Popliteal Vein: No evidence of thrombus. Normal compressibility, respiratory phasicity and response to augmentation. Calf Veins: No evidence of thrombus. Normal compressibility and flow on color Doppler imaging. Superficial Great Saphenous Vein: No evidence of thrombus. Normal compressibility. Venous Reflux:  None. Other Findings:  None. IMPRESSION: No evidence of deep venous thrombosis. Electronically Signed   By: BJeannine BogaM.D.   On:  06/16/2020 01:30   DG Chest Port 1 View  Result Date: 06/23/2020 CLINICAL DATA:  Status post video-assisted thoracoscopy. EXAM: PORTABLE CHEST 1 VIEW COMPARISON:  June 22, 2020.  FINDINGS: Stable cardiomegaly central pulmonary vascular congestion is noted. Right-sided chest tube has been removed. No pneumothorax is noted. Stable diffuse right lung opacity is noted concerning for pneumonia with associated pleural effusion, including loculated fluid and right lung apex. Small loculated left pleural effusion is noted laterally. Left perihilar and basilar opacity is noted concerning for infiltrate or possibly edema. Bony thorax is unremarkable. IMPRESSION: Stable diffuse right lung opacity is noted concerning for pneumonia with associated pleural effusion, including loculated fluid and right lung apex. Small loculated left pleural effusion is noted laterally. Left perihilar and basilar opacity is noted concerning for infiltrate or possibly edema. Electronically Signed   By: Marijo Conception M.D.   On: 06/23/2020 14:59   DG Chest Port 1 View  Result Date: 06/22/2020 CLINICAL DATA:  Preoperative chest evaluation EXAM: PORTABLE CHEST 1 VIEW COMPARISON:  06/19/2020 FINDINGS: Chest tube remains in place on the right. Somewhat less pleural density on the right. There may be a small amount of air in the pleural space. Persistent diffuse infiltrate in the right lung. Patchy infiltrate in the mid and lower lung on the left is somewhat improved. IMPRESSION: 1. Chest tube remains in place on the right. Somewhat less pleural fluid. Question small amount of air in the pleural space. 2. Persistent diffuse infiltrate on the right. 3. Improved aeration in the left mid and lower lung. Electronically Signed   By: Nelson Chimes M.D.   On: 06/22/2020 14:43   DG Chest Port 1 View  Result Date: 06/19/2020 CLINICAL DATA:  Chest tube in place EXAM: PORTABLE CHEST 1 VIEW COMPARISON:  Chest radiograph from one day prior. FINDINGS:  Right basilar pigtail chest tube is stable. Stable cardiomediastinal silhouette with mild cardiomegaly. No pneumothorax. Near complete opacification of the right hemithorax with minimally improved central right lung aeration. Extensive fluffy left parahilar lung opacities, slightly worsened. Left retrocardiac consolidation is stable. IMPRESSION: 1. Stable right basilar chest tube without pneumothorax. 2. Near complete opacification of the right hemithorax with minimally improved central right lung aeration, which could be due to any combination of pleural effusion, atelectasis, pneumonia, edema or mass. 3. Stable left retrocardiac consolidation, favor atelectasis. 4. Fluffy left parahilar lung opacities, slightly worsened, favor pulmonary edema. Electronically Signed   By: Ilona Sorrel M.D.   On: 06/19/2020 15:04   DG CHEST PORT 1 VIEW  Result Date: 06/18/2020 CLINICAL DATA:  Malignant pleural effusion, RIGHT-side chest tube EXAM: PORTABLE CHEST 1 VIEW COMPARISON:  Portable exam 1042 hours compared to 11 x 21 FINDINGS: Pigtail RIGHT thoracostomy tube again seen. Persistent subtotal opacification of the RIGHT hemithorax by effusion and atelectasis, underlying tumor not excluded. Atelectasis versus consolidation of LEFT lower lobe with probable small pleural effusion. Stable heart size. No acute osseous findings. IMPRESSION: Persistent subtotal opacification of the RIGHT hemithorax by effusion and atelectasis, underlying tumor not excluded. Persistent infiltrate and probable pleural effusion at inferior LEFT chest. Electronically Signed   By: Lavonia Dana M.D.   On: 06/18/2020 16:09   DG Chest Port 1 View  Result Date: 06/17/2020 CLINICAL DATA:  Chest x-ray 06/17/2020 5 a.m. EXAM: PORTABLE CHEST 1 VIEW COMPARISON:  Chest x-ray 06/17/2020 4:10 a.m. FINDINGS: Interval increase in size of a likely large right pleural effusion with slight aeration of the right lung. Likely trace pleural effusion on the left.  Interval increase in airspace interstitial opacities of the left lung that is most prominent in the left mid lung zone. Right pigtail chest tube overlies the right hemithorax.  IMPRESSION: 1. Slightly improved almost complete whiteout of the right chest with slightly decreased in size large pleural effusion. 2. Interval increase in interstitial and alveolar left lung opacities with possible trace left pleural effusion. Electronically Signed   By: Iven Finn M.D.   On: 06/17/2020 21:30   DG Chest Port 1 View  Result Date: 06/17/2020 CLINICAL DATA:  Pleural effusion EXAM: PORTABLE CHEST 1 VIEW COMPARISON:  Yesterday FINDINGS: More dense and homogeneous right sided opacity. Right pleural catheter in stable position. Left lower lobe opacity which also appears increased. Likely no cardiomegaly when allowing for mediastinal shift. IMPRESSION: White out of the right chest with mediastinal mass effect from large pleural effusion. Retrocardiac infiltrate that could be infection or atelectasis. Electronically Signed   By: Monte Fantasia M.D.   On: 06/17/2020 05:00   DG Chest Portable 1 View  Result Date: 06/15/2020 CLINICAL DATA:  Right chest tube placement. EXAM: PORTABLE CHEST 1 VIEW COMPARISON:  Radiograph and CT earlier today. FINDINGS: Placement of right pigtail catheter. There is persistent complete opacification of the right hemithorax. There may be slight diminished mediastinal shift to the left. No visualized pneumothorax. No focal abnormality in the left lung. IMPRESSION: Placement of right pigtail catheter with persistent complete opacification of the right hemithorax. There may be slight diminished mediastinal shift to the left. No visualized pneumothorax. Electronically Signed   By: Keith Rake M.D.   On: 06/15/2020 23:40   DG C-Arm 1-60 Min-No Report  Result Date: 06/28/2020 Fluoroscopy was utilized by the requesting physician.  No radiographic interpretation.   MR TOTAL SPINE METS  SCREENING  Result Date: 06/25/2020 CLINICAL DATA:  Metastatic breast cancer. EXAM: MRI TOTAL SPINE WITHOUT AND WITH CONTRAST TECHNIQUE: Multisequence MR imaging of the spine from the cervical spine to the sacrum was performed prior to and following IV contrast administration for evaluation of spinal metastatic disease. Large field of view sagittal imaging was performed. No axial imaging was obtained. CONTRAST:  39m GADAVIST GADOBUTROL 1 MMOL/ML IV SOLN COMPARISON:  Head MRI 06/24/2020. Chest CTA 06/15/2020. CT abdomen and pelvis 06/25/2015. FINDINGS: MRI CERVICAL SPINE FINDINGS Alignment: Cervical spine straightening.  No significant listhesis. Vertebrae: Heterogeneous bone marrow signal throughout the cervical spine with patchy areas of enhancement consistent with osseous metastatic disease. No evidence of epidural tumor. Cord: Normal signal.  No abnormal intradural enhancement. Posterior Fossa, vertebral arteries, paraspinal tissues: Posterior fossa more fully evaluated on the recent head MRI. No gross paraspinal mass on this study which does not include axial images. Disc levels: Moderate to severe disc space narrowing from C4-5 to C7-T1 with degenerative endplate changes, disc bulging, and spurring. Assessment for degenerative stenosis is limited in the absence of axial imaging. No compressive spinal stenosis. MRI THORACIC SPINE FINDINGS Alignment: Slight thoracic levoscoliosis. No significant listhesis. Vertebrae: Heterogeneous bone marrow signal throughout the thoracic spine with patchy areas of enhancement consistent with osseous metastatic disease. No evidence of epidural tumor. Cord:  Normal signal.  No abnormal intradural enhancement. Paraspinal and other soft tissues: Partially visualized known large right pleural effusion as well as a small left pleural effusion. Partially visualized known right breast mass. Disc levels: Mild-to-moderate diffuse thoracic disc degeneration with disc bulging. No  compressive stenosis. MRI LUMBAR SPINE FINDINGS Segmentation:  Standard. Alignment:  Trace retrolisthesis of L2 on L3. Vertebrae: Heterogeneous bone marrow signal throughout the lumbar spine and sacrum with patchy areas of enhancement consistent with osseous metastatic disease. No evidence of epidural tumor. Conus medullaris: Extends to the L1 level  and appears normal. Paraspinal and other soft tissues: No gross paraspinal mass on this study which does not include axial images. Disc levels: Focally advanced disc space narrowing at L2-3 with degenerative endplate changes. Mild disc bulging at L2-3, L3-4, and L4-5 without evidence of compressive stenosis. IMPRESSION: 1. Widespread osseous metastatic disease. No evidence of epidural tumor. 2. Partial coverage of known right breast mass and large right and small left pleural effusions. Electronically Signed   By: Logan Bores M.D.   On: 06/25/2020 15:12     Assessment and plan Patient is a 54 y.o. female presents for follow-up of metastatic triple negative breast cancer 1. Metastatic breast cancer ([place])   2. Goals of care, counseling/discussion   3. Encounter for antineoplastic chemotherapy   4. Hypocalcemia   5. Complication of chemotherapy, initial encounter   6. Bone metastasis ([place])   7. Iron deficiency anemia, unspecified iron deficiency anemia type   8. 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 Status post 1 dose of Taxol 80 mg/m.  She tolerates well. Labs are reviewed and discussed with patient. She will proceed with Taxol 80 mg/m on 07/06/2020. Check NGS.  If PD-L1 CPS >10, plan to add Beryle Flock  Refer to palliative care service. She will also need genetic counselor today.PET scan is scheduled to complete staging  #Malignant pleural effusion on the right Currently she is not shortness of breath.  Continue monitor symptoms.  Thoracentesis as needed.  #Bone metastasis, patient will receive Zometa  treatment. Rationale and potential side effects including low calcium, muscle cramps, diarrhea, osteonecrosis was discussed with patient.  She understands and waives dental clearance. Recommend patient to start taking calcium 1200 mg daily.  Vitamin D 1000 units daily.  I will check vitamin D level at the next visit.  # Folate deficiency, continue folic acid  # Iron deficiency anemia s/p IV venofer x 2, anemia has improved. Monitor.  #Neoplasm related pain, currently she does not have significant pain.  Continue Tylenol as needed.  If not controlled, she may utilize tramadol 50 mg every 6 hours as needed. #GERD, continue Protonix 40 mg daily. #Hypocalcemia, recommend patient to start calcium supplementation. Patient is not insured.  She was not able to get any of medication prescription filled. Talk to Education officer, museum.  I resent her prescriptions to total care using [place] [facility] Encounter  Procedures  . Vitamin D 25 hydroxy    Standing Status:   Future    Standing Expiration Date:   07/04/2021     follow up in 1 week   Earlie Server, MD, PhD Hematology Oncology [facility] Pager- [phone] 07/04/2020

## 2020-07-04 NOTE — Progress Notes (Signed)
[place]  Telephone:([phone] [phone]   Name: Jessica Willis Date: 07/04/2020 MRN: [id number]  DOB: 01-16-1966  Patient Care Team: System, Provider Not In as PCP - General Rico Junker, RN as Registered Nurse    REASON FOR CONSULTATION: Jessica Willis is a 54 y.o. female with multiple medical problems including history of anemia who was hospitalized 06/15/2020 to 07/01/2020 with sepsis and acute hypoxic respiratory failure.  She was found to have a fungating right breast mass with purulent drainage concerning for source of infection.  CTA of the chest revealed near complete collapse of the right lung with extensive right-sided pleural effusion and mass-effect.  Patient underwent chest tube insertion.  Palliative care was consulted help address goals.   SOCIAL HISTORY:     reports that she has never smoked. She has never used smokeless tobacco. She reports previous alcohol use of about 1.0 standard drink of alcohol per week. She reports that she does not use drugs.   Patient is unmarried and lives at home with her boyfriend.  She has 2 sons and a daughter who are involved in her care.  Patient worked at an [employer].  ADVANCE DIRECTIVES:  Does not have  CODE STATUS:   PAST MEDICAL HISTORY: Past Medical History:  Diagnosis Date   Anemia    Family history of breast cancer    Patient denies medical problems     PAST SURGICAL HISTORY:  Past Surgical History:  Procedure Laterality Date   BREAST BIOPSY  06/28/2020   Procedure: BREAST BIOPSY;  Surgeon: Benjamine Sprague, DO;  Location: [facility] ORS;  Service: General;;   PORTACATH PLACEMENT N/A 06/28/2020   Procedure: INSERTION PORT-A-CATH;  Surgeon: Benjamine Sprague, DO;  Location: [facility] ORS;  Service: General;  Laterality: N/A;   TUBAL LIGATION     VIDEO ASSISTED THORACOSCOPY (VATS)/THOROCOTOMY Right 06/23/2020   Procedure: ATTEMPTED VIDEO ASSISTED  THORACOSCOPY (VATS);  Surgeon: Nestor Lewandowsky, MD;  Location: [facility] ORS;  Service: General;  Laterality: Right;    HEMATOLOGY/ONCOLOGY HISTORY:  Oncology History  Metastatic breast cancer ([place])  06/21/2020 Initial Diagnosis   Metastatic breast cancer ([place])   06/29/2020 -  Chemotherapy   The patient had PACLitaxel (TAXOL) 162 mg in sodium chloride 0.9 % 250 mL chemo infusion (</= 80mg /m2), 80 mg/m2 = 162 mg, Intravenous,  Once, 1 of 4 cycles Administration: 162 mg (06/29/2020)  for chemotherapy treatment.      ALLERGIES:  has No Known Allergies.  MEDICATIONS:  Current Outpatient Medications  Medication Sig Dispense Refill   cholecalciferol (VITAMIN D) 25 MCG tablet Take 1 tablet (1,000 Units total) by mouth daily. 30 tablet 0   lidocaine-prilocaine (EMLA) cream Apply 1 application topically as needed. Place a small amount of cream to port site prior to each chemo treatment, 1-2 hours before treatment. (Patient not taking: Reported on 07/04/2020) 30 g 0   ondansetron (ZOFRAN) 8 MG tablet Take 1 tablet (8 mg total) by mouth every 8 (eight) hours as needed for up to 14 days for nausea or vomiting. (Patient not taking: Reported on 07/04/2020) 42 tablet 0   pantoprazole (PROTONIX) 40 MG tablet Take 1 tablet (40 mg total) by mouth daily. (Patient not taking: Reported on 07/04/2020) 30 tablet 0   traMADol (ULTRAM) 50 MG tablet Take 1 tablet (50 mg total) by mouth every 6 (six) hours as needed for up to 5 days for moderate pain or severe pain. (Patient not taking: Reported on 07/04/2020) 20  tablet 0   No current facility-administered medications for this visit.    VITAL SIGNS: LMP 06/16/2015 Comment: pt has had tubes tied  There were no vitals filed for this visit.  Estimated body mass index is 26 kg/m as calculated from the following:   Height as of 06/28/20: 5\' 10"  (1.778 m).   Weight as of an earlier encounter on 07/04/20: 181 lb 3.2 oz (82.2 kg).  LABS: CBC:    Component Value  Date/Time   WBC 9.2 07/04/2020 0908   HGB 10.5 (L) 07/04/2020 0908   HCT 33.9 (L) 07/04/2020 0908   PLT 324 07/04/2020 0908   MCV 82.5 07/04/2020 0908   NEUTROABS 7.5 07/04/2020 0908   LYMPHSABS 1.1 07/04/2020 0908   MONOABS 0.4 07/04/2020 0908   EOSABS 0.1 07/04/2020 0908   BASOSABS 0.0 07/04/2020 0908   Comprehensive Metabolic Panel:    Component Value Date/Time   NA 139 07/04/2020 0908   K 3.7 07/04/2020 0908   CL 103 07/04/2020 0908   CO2 26 07/04/2020 0908   BUN 19 07/04/2020 0908   CREATININE 0.59 07/04/2020 0908   GLUCOSE 117 (H) 07/04/2020 0908   CALCIUM 8.8 (L) 07/04/2020 0908   AST 41 07/04/2020 0908   ALT 24 07/04/2020 0908   ALKPHOS 106 07/04/2020 0908   BILITOT 0.5 07/04/2020 0908   PROT 7.0 07/04/2020 0908   ALBUMIN 3.3 (L) 07/04/2020 0908    RADIOGRAPHIC STUDIES: DG Chest 1 View  Result Date: 06/28/2020 CLINICAL DATA:  Status port MediPort catheter placement EXAM: CHEST  1 VIEW COMPARISON:  June 23, 2020 FINDINGS: The heart size and mediastinal contours are unchanged with mild cardiomegaly. There is prominence of the central pulmonary vasculature. Again noted is patchy airspace opacity throughout the right lung with a probable loculated moderate pleural effusion unchanged from prior exam. Left perihilar hazy airspace opacity is noted. A left-sided MediPort catheter seen with the tip at the right atrium. No acute osseous abnormality. IMPRESSION: Left MediPort catheter tip just at the right atrium. No other change in the bilateral perihilar and patchy airspace opacities Loculated right pleural effusion Electronically Signed   By: Prudencio Pair M.D.   On: 06/28/2020 15:31   DG Chest 1 View  Result Date: 06/16/2020 CLINICAL DATA:  Hemothorax on the right. EXAM: CHEST  1 VIEW COMPARISON:  Same day chest radiograph. FINDINGS: A right-sided pleural pigtail catheter is redemonstrated. There is interval improvement in aeration of the right lung with decreased size of a  right hemothorax. A small right pneumothorax is not significantly changed in size. Severe right atelectasis/airspace disease is redemonstrated. Mild interstitial opacities in the left lung appear unchanged. There is no left pleural effusion or left pneumothorax. The cardiac silhouette is partially obscured but appears unchanged. IMPRESSION: 1. Interval improvement in aeration of the right lung with decreased size of a right hemothorax. 2. Unchanged small right pneumothorax with pleural pigtail catheter in place. 3. Severe right atelectasis/airspace disease. Electronically Signed   By: Zerita Boers M.D.   On: 06/16/2020 14:52   DG Chest 1 View  Result Date: 06/16/2020 CLINICAL DATA:  Cough and phlegm production. EXAM: CHEST  1 VIEW COMPARISON:  Chest radiograph dated 06/15/2020. FINDINGS: The cardiac silhouette is obscured. A right-sided pleural pigtail catheter is in unchanged location and there has been interval decrease in a large right pleural fluid collection (effusion versus hemothorax). There is no longer mediastinal shift to the left. There is a small right pneumothorax. Severe airspace and interstitial opacities are  seen on the right. Mild interstitial opacities are seen on the left. There is no left pleural effusion or pneumothorax. The osseous structures are intact. IMPRESSION: 1. Small right pneumothorax with a right-sided pleural pigtail catheter in place. 2. Interval decrease in a large right pleural fluid collection (effusion versus hemothorax). 3. Severe airspace and interstitial opacities on the right. Mild interstitial opacities in the left. Electronically Signed   By: Zerita Boers M.D.   On: 06/16/2020 14:28   DG Chest 1 View  Result Date: 06/15/2020 CLINICAL DATA:  Short of breath, cough EXAM: CHEST  1 VIEW COMPARISON:  06/29/2015 FINDINGS: 2 frontal views of the chest demonstrate complete opacification of the right hemithorax, with slight mediastinal shift to the left, consistent with  large right pleural effusion and underlying lung consolidation. Chronic areas of scarring are seen within the left chest. No pneumothorax. Cardiac silhouette is unremarkable. No acute bony abnormalities. IMPRESSION: 1. Opacification of the right hemithorax consistent with large right pleural effusion and underlying right lung consolidation. Electronically Signed   By: Randa Ngo M.D.   On: 06/15/2020 21:42   CT Angio Chest PE W and/or Wo Contrast  Result Date: 06/15/2020 CLINICAL DATA:  Cough, shortness of breath EXAM: CT ANGIOGRAPHY CHEST WITH CONTRAST TECHNIQUE: Multidetector CT imaging of the chest was performed using the standard protocol during bolus administration of intravenous contrast. Multiplanar CT image reconstructions and MIPs were obtained to evaluate the vascular anatomy. CONTRAST:  100 mL Omnipaque 350 IV COMPARISON:  06/30/2015 FINDINGS: Cardiovascular: No filling defects in the pulmonary arteries to suggest pulmonary emboli. Heart is normal size. Aorta normal caliber. Mediastinum/Nodes: No visible mediastinal or hilar adenopathy. Bilateral axillary adenopathy, right greater than left. Index right axillary lymph node has a short axis diameter of 13 mm. Left axillary lymph node has a short axis diameter of 11 mm. Lungs/Pleura: Large right pleural effusion completely compressed seen in the right lung which is not aerated. This appears to have mass effect with shift of the right lung and mediastinal structures to the left. Areas of atelectasis or scarring in the left lung base. No effusion on the left. Upper Abdomen: Imaging into the upper abdomen demonstrates no acute findings. Musculoskeletal: Large mass noted in the right breast measuring 8 x 6 cm. Possible separate right breast mass more inferiorly in the right breast measuring 8 x 4 cm. Skin thickening or edema noted. Associated right axillary adenopathy. A few ill-defined sclerotic areas within the thoracic spine could reflect metastases.  Review of the MIP images confirms the above findings. IMPRESSION: No evidence of pulmonary embolus. One, possibly 2 separate large masses within the right breast extending to the skin surface with associated adjacent skin thickening and right axillary adenopathy. Findings compatible with right breast cancer. Large right pleural effusion which appears to have mass effect with complete collapse/compression of the right lung and shift of mediastinal structures to the left. Single enlarged left axillary lymph node. These results were called by telephone at the time of interpretation on 06/15/2020 at 10:43 pm to provider [facility] , who verbally acknowledged these results. Electronically Signed   By: Rolm Baptise M.D.   On: 06/15/2020 22:46   MR BRAIN W WO CONTRAST  Result Date: 06/24/2020 CLINICAL DATA:  History of metastatic breast cancer.  Staging. EXAM: MRI HEAD WITHOUT AND WITH CONTRAST TECHNIQUE: Multiplanar, multiecho pulse sequences of the brain and surrounding structures were obtained without and with intravenous contrast. CONTRAST:  30mL GADAVIST GADOBUTROL 1 MMOL/ML IV SOLN COMPARISON:  None. FINDINGS: Brain: The brain has a normal appearance without evidence of malformation, atrophy, old or acute small or large vessel infarction, mass lesion, hemorrhage, hydrocephalus or extra-axial collection. After contrast administration, no enhancement the brain or leptomeninges occurs. Vascular: Major vessels at the base of the brain show flow. Venous sinuses appear patent. Skull and upper cervical spine: 1 cm metastasis affecting the clivus. Scattered metastases in the upper cervical spine. Sinuses/Orbits: Clear/normal. Other: None significant. IMPRESSION: 1. Normal appearance of the brain itself. No evidence of metastatic disease to the brain or leptomeninges. 2. 1 cm metastasis affecting the clivus. Scattered metastases in the upper cervical spine. Electronically Signed   By: Nelson Chimes M.D.   On: 06/24/2020  11:53   US Venous Img Lower Unilateral Left (DVT)  Result Date: 06/16/2020 CLINICAL DATA:  Initial evaluation for acute left lower extremity edema. EXAM: LEFT LOWER EXTREMITY VENOUS DOPPLER ULTRASOUND TECHNIQUE: Gray-scale sonography with graded compression, as well as color Doppler and duplex ultrasound were performed to evaluate the lower extremity deep venous systems from the level of the common femoral vein and including the common femoral, femoral, profunda femoral, popliteal and calf veins including the posterior tibial, peroneal and gastrocnemius veins when visible. The superficial great saphenous vein was also interrogated. Spectral Doppler was utilized to evaluate flow at rest and with distal augmentation maneuvers in the common femoral, femoral and popliteal veins. COMPARISON:  None. FINDINGS: Contralateral Common Femoral Vein: Respiratory phasicity is normal and symmetric with the symptomatic side. No evidence of thrombus. Normal compressibility. Common Femoral Vein: No evidence of thrombus. Normal compressibility, respiratory phasicity and response to augmentation. Saphenofemoral Junction: No evidence of thrombus. Normal compressibility and flow on color Doppler imaging. Profunda Femoral Vein: No evidence of thrombus. Normal compressibility and flow on color Doppler imaging. Femoral Vein: No evidence of thrombus. Normal compressibility, respiratory phasicity and response to augmentation. Popliteal Vein: No evidence of thrombus. Normal compressibility, respiratory phasicity and response to augmentation. Calf Veins: No evidence of thrombus. Normal compressibility and flow on color Doppler imaging. Superficial Great Saphenous Vein: No evidence of thrombus. Normal compressibility. Venous Reflux:  None. Other Findings:  None. IMPRESSION: No evidence of deep venous thrombosis. Electronically Signed   By: Jeannine Boga M.D.   On: 06/16/2020 01:30   DG Chest Port 1 View  Result Date:  06/23/2020 CLINICAL DATA:  Status post video-assisted thoracoscopy. EXAM: PORTABLE CHEST 1 VIEW COMPARISON:  June 22, 2020. FINDINGS: Stable cardiomegaly central pulmonary vascular congestion is noted. Right-sided chest tube has been removed. No pneumothorax is noted. Stable diffuse right lung opacity is noted concerning for pneumonia with associated pleural effusion, including loculated fluid and right lung apex. Small loculated left pleural effusion is noted laterally. Left perihilar and basilar opacity is noted concerning for infiltrate or possibly edema. Bony thorax is unremarkable. IMPRESSION: Stable diffuse right lung opacity is noted concerning for pneumonia with associated pleural effusion, including loculated fluid and right lung apex. Small loculated left pleural effusion is noted laterally. Left perihilar and basilar opacity is noted concerning for infiltrate or possibly edema. Electronically Signed   By: Marijo Conception M.D.   On: 06/23/2020 14:59   DG Chest Port 1 View  Result Date: 06/22/2020 CLINICAL DATA:  Preoperative chest evaluation EXAM: PORTABLE CHEST 1 VIEW COMPARISON:  06/19/2020 FINDINGS: Chest tube remains in place on the right. Somewhat less pleural density on the right. There may be a small amount of air in the pleural space. Persistent diffuse infiltrate in the right lung.  Patchy infiltrate in the mid and lower lung on the left is somewhat improved. IMPRESSION: 1. Chest tube remains in place on the right. Somewhat less pleural fluid. Question small amount of air in the pleural space. 2. Persistent diffuse infiltrate on the right. 3. Improved aeration in the left mid and lower lung. Electronically Signed   By: Nelson Chimes M.D.   On: 06/22/2020 14:43   DG Chest Port 1 View  Result Date: 06/19/2020 CLINICAL DATA:  Chest tube in place EXAM: PORTABLE CHEST 1 VIEW COMPARISON:  Chest radiograph from one day prior. FINDINGS: Right basilar pigtail chest tube is stable. Stable  cardiomediastinal silhouette with mild cardiomegaly. No pneumothorax. Near complete opacification of the right hemithorax with minimally improved central right lung aeration. Extensive fluffy left parahilar lung opacities, slightly worsened. Left retrocardiac consolidation is stable. IMPRESSION: 1. Stable right basilar chest tube without pneumothorax. 2. Near complete opacification of the right hemithorax with minimally improved central right lung aeration, which could be due to any combination of pleural effusion, atelectasis, pneumonia, edema or mass. 3. Stable left retrocardiac consolidation, favor atelectasis. 4. Fluffy left parahilar lung opacities, slightly worsened, favor pulmonary edema. Electronically Signed   By: Ilona Sorrel M.D.   On: 06/19/2020 15:04   DG CHEST PORT 1 VIEW  Result Date: 06/18/2020 CLINICAL DATA:  Malignant pleural effusion, RIGHT-side chest tube EXAM: PORTABLE CHEST 1 VIEW COMPARISON:  Portable exam 1042 hours compared to 11 x 21 FINDINGS: Pigtail RIGHT thoracostomy tube again seen. Persistent subtotal opacification of the RIGHT hemithorax by effusion and atelectasis, underlying tumor not excluded. Atelectasis versus consolidation of LEFT lower lobe with probable small pleural effusion. Stable heart size. No acute osseous findings. IMPRESSION: Persistent subtotal opacification of the RIGHT hemithorax by effusion and atelectasis, underlying tumor not excluded. Persistent infiltrate and probable pleural effusion at inferior LEFT chest. Electronically Signed   By: Lavonia Dana M.D.   On: 06/18/2020 16:09   DG Chest Port 1 View  Result Date: 06/17/2020 CLINICAL DATA:  Chest x-ray 06/17/2020 5 a.m. EXAM: PORTABLE CHEST 1 VIEW COMPARISON:  Chest x-ray 06/17/2020 4:10 a.m. FINDINGS: Interval increase in size of a likely large right pleural effusion with slight aeration of the right lung. Likely trace pleural effusion on the left. Interval increase in airspace interstitial opacities of  the left lung that is most prominent in the left mid lung zone. Right pigtail chest tube overlies the right hemithorax. IMPRESSION: 1. Slightly improved almost complete whiteout of the right chest with slightly decreased in size large pleural effusion. 2. Interval increase in interstitial and alveolar left lung opacities with possible trace left pleural effusion. Electronically Signed   By: Iven Finn M.D.   On: 06/17/2020 21:30   DG Chest Port 1 View  Result Date: 06/17/2020 CLINICAL DATA:  Pleural effusion EXAM: PORTABLE CHEST 1 VIEW COMPARISON:  Yesterday FINDINGS: More dense and homogeneous right sided opacity. Right pleural catheter in stable position. Left lower lobe opacity which also appears increased. Likely no cardiomegaly when allowing for mediastinal shift. IMPRESSION: White out of the right chest with mediastinal mass effect from large pleural effusion. Retrocardiac infiltrate that could be infection or atelectasis. Electronically Signed   By: Monte Fantasia M.D.   On: 06/17/2020 05:00   DG Chest Portable 1 View  Result Date: 06/15/2020 CLINICAL DATA:  Right chest tube placement. EXAM: PORTABLE CHEST 1 VIEW COMPARISON:  Radiograph and CT earlier today. FINDINGS: Placement of right pigtail catheter. There is persistent complete opacification of the  right hemithorax. There may be slight diminished mediastinal shift to the left. No visualized pneumothorax. No focal abnormality in the left lung. IMPRESSION: Placement of right pigtail catheter with persistent complete opacification of the right hemithorax. There may be slight diminished mediastinal shift to the left. No visualized pneumothorax. Electronically Signed   By: Keith Rake M.D.   On: 06/15/2020 23:40   DG C-Arm 1-60 Min-No Report  Result Date: 06/28/2020 Fluoroscopy was utilized by the requesting physician.  No radiographic interpretation.   MR TOTAL SPINE METS SCREENING  Result Date: 06/25/2020 CLINICAL DATA:   Metastatic breast cancer. EXAM: MRI TOTAL SPINE WITHOUT AND WITH CONTRAST TECHNIQUE: Multisequence MR imaging of the spine from the cervical spine to the sacrum was performed prior to and following IV contrast administration for evaluation of spinal metastatic disease. Large field of view sagittal imaging was performed. No axial imaging was obtained. CONTRAST:  44mL GADAVIST GADOBUTROL 1 MMOL/ML IV SOLN COMPARISON:  Head MRI 06/24/2020. Chest CTA 06/15/2020. CT abdomen and pelvis 06/25/2015. FINDINGS: MRI CERVICAL SPINE FINDINGS Alignment: Cervical spine straightening.  No significant listhesis. Vertebrae: Heterogeneous bone marrow signal throughout the cervical spine with patchy areas of enhancement consistent with osseous metastatic disease. No evidence of epidural tumor. Cord: Normal signal.  No abnormal intradural enhancement. Posterior Fossa, vertebral arteries, paraspinal tissues: Posterior fossa more fully evaluated on the recent head MRI. No gross paraspinal mass on this study which does not include axial images. Disc levels: Moderate to severe disc space narrowing from C4-5 to C7-T1 with degenerative endplate changes, disc bulging, and spurring. Assessment for degenerative stenosis is limited in the absence of axial imaging. No compressive spinal stenosis. MRI THORACIC SPINE FINDINGS Alignment: Slight thoracic levoscoliosis. No significant listhesis. Vertebrae: Heterogeneous bone marrow signal throughout the thoracic spine with patchy areas of enhancement consistent with osseous metastatic disease. No evidence of epidural tumor. Cord:  Normal signal.  No abnormal intradural enhancement. Paraspinal and other soft tissues: Partially visualized known large right pleural effusion as well as a small left pleural effusion. Partially visualized known right breast mass. Disc levels: Mild-to-moderate diffuse thoracic disc degeneration with disc bulging. No compressive stenosis. MRI LUMBAR SPINE FINDINGS Segmentation:   Standard. Alignment:  Trace retrolisthesis of L2 on L3. Vertebrae: Heterogeneous bone marrow signal throughout the lumbar spine and sacrum with patchy areas of enhancement consistent with osseous metastatic disease. No evidence of epidural tumor. Conus medullaris: Extends to the L1 level and appears normal. Paraspinal and other soft tissues: No gross paraspinal mass on this study which does not include axial images. Disc levels: Focally advanced disc space narrowing at L2-3 with degenerative endplate changes. Mild disc bulging at L2-3, L3-4, and L4-5 without evidence of compressive stenosis. IMPRESSION: 1. Widespread osseous metastatic disease. No evidence of epidural tumor. 2. Partial coverage of known right breast mass and large right and small left pleural effusions. Electronically Signed   By: Logan Bores M.D.   On: 06/25/2020 15:12    PERFORMANCE STATUS (ECOG) : 2 - Symptomatic, <50% confined to bed  Review of Systems Unless otherwise noted, a complete review of systems is negative.  Physical Exam General: NAD, thin, frail-appearing Pulmonary: Unlabored Extremities: no edema, no joint deformities Skin: no rashes Neurological: Weakness but otherwise nonfocal  IMPRESSION: Post hospital follow-up visit.  I reintroduced palliative care services to patient and her son.  Patient verbalized that her goals are consistent with ongoing treatment.  Plan is for continued chemotherapy.  Symptomatically, patient reports minimal pain.  She has home  and ambulating with use of a walker.  It appears home health was ordered but patient has no insurance coverage.  Social work consult is pending.  Patient says that she is doing reasonably well at home.  She lives with her boyfriend and her children are available to help as needed.  I have previously attempted to discuss CODE STATUS and advance care planning with patient and she had verbalized a lack of interest in having this conversations.  Will continue  to readdress as appropriate in the future.  PLAN: -Continue current scope of treatment -ACP previously discussed and pt not interested -Follow up MyChart visit in 3-4 weeks   Patient expressed understanding and was in agreement with this plan. She also understands that She can call the clinic at any time with any questions, concerns, or complaints.     Time Total: 20 minutes  Visit consisted of counseling and education dealing with the complex and emotionally intense issues of symptom management and palliative care in the setting of serious and potentially life-threatening illness.Greater than 50%  of this time was spent counseling and coordinating care related to the above assessment and plan.  Signed by: Altha Harm, PhD, NP-C

## 2020-07-04 NOTE — Telephone Encounter (Signed)
Request for [facility] testing faxed to [facility] on Specimen ID : [id number] collected on 06/28/20 (right breast).

## 2020-07-04 NOTE — Progress Notes (Signed)
REFERRING PROVIDER: Earlie Server, MD [place],  [place] [id number]  PRIMARY PROVIDER:  System, Provider Not In  PRIMARY REASON FOR VISIT:  1. Metastatic breast cancer ([place])   2. Family history of breast cancer      HISTORY OF PRESENT ILLNESS:   Jessica Willis, a 54 y.o. female, was seen for a [facility] cancer genetics consultation at the request of Dr.Yu due to a personal history of breast cancer.  Jessica Willis presents to clinic today to discuss the possibility of a hereditary predisposition to cancer, genetic testing, and to further clarify her future cancer risks, as well as potential cancer risks for family members.   In 2021, at the age of 75, Jessica Willis was diagnosed with invasive mammary carcinoma of the right breast, metastatic. She is currently being treated with chemotherapy.   CANCER HISTORY:  Oncology History  Metastatic breast cancer ([place])  06/21/2020 Initial Diagnosis   Metastatic breast cancer ([place])   06/29/2020 -  Chemotherapy   The patient had PACLitaxel (TAXOL) 162 mg in sodium chloride 0.9 % 250 mL chemo infusion (</= 24m/m2), 80 mg/m2 = 162 mg, Intravenous,  Once, 1 of 4 cycles Administration: 162 mg (06/29/2020)  for chemotherapy treatment.       RISK FACTORS:  Menarche was at age 54  First live birth at age 54  Ovaries intact: yes.  Hysterectomy: no.  Menopausal status: postmenopausal.   Colonoscopy: no; not examined. Any excessive radiation exposure in the past: no  Past Medical History:  Diagnosis Date  . Anemia   . Family history of breast cancer   . Patient denies medical problems     Past Surgical History:  Procedure Laterality Date  . BREAST BIOPSY  06/28/2020   Procedure: BREAST BIOPSY;  Surgeon: SBenjamine Sprague DO;  Location: [facility] ORS;  Service: General;;  . PORTACATH PLACEMENT N/A 06/28/2020   Procedure: INSERTION PORT-A-CATH;  Surgeon: SBenjamine Sprague DO;  Location: [facility] ORS;  Service: General;  Laterality: N/A;  . TUBAL  LIGATION    . VIDEO ASSISTED THORACOSCOPY (VATS)/THOROCOTOMY Right 06/23/2020   Procedure: ATTEMPTED VIDEO ASSISTED THORACOSCOPY (VATS);  Surgeon: ONestor Lewandowsky MD;  Location: [facility] ORS;  Service: General;  Laterality: Right;    Social History   Socioeconomic History  . Marital status: Single    Spouse name: Not on file  . Number of children: Not on file  . Years of education: Not on file  . Highest education level: Not on file  Occupational History  . Not on file  Tobacco Use  . Smoking status: Never Smoker  . Smokeless tobacco: Never Used  Substance and Sexual Activity  . Alcohol use: Not Currently    Alcohol/week: 1.0 standard drink    Types: 1 Cans of beer per week  . Drug use: No  . Sexual activity: Not on file  Other Topics Concern  . Not on file  Social History Narrative  . Not on file   Social Determinants of Health   Financial Resource Strain:   . Difficulty of Paying Living Expenses: Not on file  Food Insecurity:   . Worried About RCharity fundraiserin the Last Year: Not on file  . Ran Out of Food in the Last Year: Not on file  Transportation Needs:   . Lack of Transportation (Medical): Not on file  . Lack of Transportation (Non-Medical): Not on file  Physical Activity:   . Days of Exercise per Week: Not on file  .  Minutes of Exercise per Session: Not on file  Stress:   . Feeling of Stress : Not on file  Social Connections:   . Frequency of Communication with Friends and Family: Not on file  . Frequency of Social Gatherings with Friends and Family: Not on file  . Attends Religious Services: Not on file  . Active Member of Clubs or Organizations: Not on file  . Attends Archivist Meetings: Not on file  . Marital Status: Not on file     FAMILY HISTORY:  We obtained a detailed, 4-generation family history.  Significant diagnoses are listed below: Family History  Problem Relation Age of Onset  . Diabetes Other   . Hypertension Other   .  Diabetes Maternal Aunt   . Breast cancer Cousin        dx 19s  . Breast cancer Cousin        dx 54s   Jessica Willis has 2 sons, ages 29 and 20 and a daughter, 39, no cancers. She has 2 full sisters, 3 full brothers and 1 half sister through her mother, no cancers.   Jessica Willis mother is living at 10. She had about 13 siblings. Two of her sisters had daughters who had breast cancer, one in her 41s and one in her 58s. No other known cancers on this side of the family.  Jessica Willis father is living at 40, no reported family history of cancer on this side of the family.  Jessica Willis is unaware of previous family history of genetic testing for hereditary cancer risks.  There is no reported Ashkenazi Jewish ancestry. There is no known consanguinity.    GENETIC COUNSELING ASSESSMENT: Jessica Willis is a 54 y.o. female with a personal and family history of breast cancer which is somewhat suggestive of a hereditary cancer syndrome and predisposition to cancer. We, therefore, discussed and recommended the following at today's visit.   DISCUSSION: We discussed that approximately 5-10% of breast cancer is hereditary  Most cases of hereditary breast cancer are associated with BRCA1/BRCA2 genes, although there are other genes associated with hereditary breast and other cancers as well. We discussed that testing is beneficial for several reasons including knowing if an individual is a candidate for targeted therapy, knowing about other cancer risks, identifying potential screening and risk-reduction options that may be appropriate, and to understand if other family members could be at risk for cancer and allow them to undergo genetic testing.   We reviewed the characteristics, features and inheritance patterns of hereditary cancer syndromes. We also discussed genetic testing, including the appropriate family members to test, the process of testing, insurance coverage and turn-around-time for results. We  discussed the implications of a negative, positive and/or variant of uncertain significant result. We recommended Jessica Willis pursue genetic testing for the [facility] Common Hereditary Cancers gene panel.   The Common Hereditary Cancers Panel offered by [facility] includes sequencing and/or deletion duplication testing of the following 48 genes: APC, ATM, AXIN2, BARD1, BMPR1A, BRCA1, BRCA2, BRIP1, CDH1, CDKN2A (p14ARF), CDKN2A (p16INK4a), CKD4, CHEK2, CTNNA1, DICER1, EPCAM (Deletion/duplication testing only), GREM1 (promoter region deletion/duplication testing only), KIT, MEN1, MLH1, MSH2, MSH3, MSH6, MUTYH, NBN, NF1, NHTL1, PALB2, PDGFRA, PMS2, POLD1, POLE, PTEN, RAD50, RAD51C, RAD51D, RNF43, SDHB, SDHC, SDHD, SMAD4, SMARCA4. STK11, TP53, TSC1, TSC2, and VHL.  The following genes were evaluated for sequence changes only: SDHA and HOXB13 c.251G>A variant only.  Based on Jessica Willis's personal and family history of cancer, she meets medical criteria for  genetic testing. Despite that she meets criteria, she may still have an out of pocket cost.   PLAN: After considering the risks, benefits, and limitations,  Jessica Willis did not wish to pursue genetic testing at today's visit. She would like to think further about this and will call me with her decision. We understand this decision and remain available to coordinate genetic testing at any time in the future. We, therefore, recommend Jessica Willis continue to follow the cancer screening guidelines given by her primary healthcare provider.  Jessica Willis questions were answered to her satisfaction today. Our contact information was provided should additional questions or concerns arise. Thank you for the referral and allowing [place] to share in the care of your patient.   Faith Rogue, MS, [facility] Genetic Counselor [email or web address] Phone: [phone]  The patient was seen for a total of 15 minutes in face-to-face genetic counseling. Her son, Lytle Michaels,  was also present. Dr. Grayland Ormond was available for discussion regarding this case.   _______________________________________________________________________ For Office Staff:  Number of people involved in session: 2 Was an Intern/ student involved with case: no

## 2020-07-05 ENCOUNTER — Ambulatory Visit: Payer: Self-pay

## 2020-07-06 ENCOUNTER — Inpatient Hospital Stay: Payer: Medicaid Other

## 2020-07-06 ENCOUNTER — Other Ambulatory Visit: Payer: Self-pay

## 2020-07-06 VITALS — BP 144/77 | HR 108 | Temp 96.9°F | Resp 17

## 2020-07-06 DIAGNOSIS — Z5111 Encounter for antineoplastic chemotherapy: Secondary | ICD-10-CM | POA: Diagnosis not present

## 2020-07-06 DIAGNOSIS — C50919 Malignant neoplasm of unspecified site of unspecified female breast: Secondary | ICD-10-CM

## 2020-07-06 DIAGNOSIS — C7951 Secondary malignant neoplasm of bone: Secondary | ICD-10-CM

## 2020-07-06 MED ORDER — SODIUM CHLORIDE 0.9 % IV SOLN
Freq: Once | INTRAVENOUS | Status: AC
  Filled 2020-07-06: qty 250

## 2020-07-06 MED ORDER — DIPHENHYDRAMINE HCL 50 MG/ML IJ SOLN
50.0000 mg | Freq: Once | INTRAMUSCULAR | Status: AC
  Administered 2020-07-06: 50 mg via INTRAVENOUS
  Filled 2020-07-06: qty 1

## 2020-07-06 MED ORDER — SODIUM CHLORIDE 0.9 % IV SOLN
10.0000 mg | Freq: Once | INTRAVENOUS | Status: AC
  Administered 2020-07-06: 10 mg via INTRAVENOUS
  Filled 2020-07-06: qty 10

## 2020-07-06 MED ORDER — ZOLEDRONIC ACID 4 MG/100ML IV SOLN
4.0000 mg | Freq: Once | INTRAVENOUS | Status: AC
  Administered 2020-07-06: 4 mg via INTRAVENOUS
  Filled 2020-07-06: qty 100

## 2020-07-06 MED ORDER — FAMOTIDINE IN NACL 20-0.9 MG/50ML-% IV SOLN
20.0000 mg | Freq: Once | INTRAVENOUS | Status: AC
  Administered 2020-07-06: 20 mg via INTRAVENOUS
  Filled 2020-07-06: qty 50

## 2020-07-06 MED ORDER — SODIUM CHLORIDE 0.9 % IV SOLN
80.0000 mg/m2 | Freq: Once | INTRAVENOUS | Status: AC
  Administered 2020-07-06: 162 mg via INTRAVENOUS
  Filled 2020-07-06: qty 27

## 2020-07-06 MED ORDER — HEPARIN SOD (PORK) LOCK FLUSH 100 UNIT/ML IV SOLN
500.0000 [IU] | Freq: Once | INTRAVENOUS | Status: AC | PRN
  Administered 2020-07-06: 500 [IU]
  Filled 2020-07-06: qty 5

## 2020-07-06 NOTE — Progress Notes (Signed)
HR 118, Per Dr. Grayland Ormond okay to proceed with Zometa and Taxol as scheduled.   1215: Pt tolerated infusion well. No s/s of distress or reaction noted. Pt stable at discharge.

## 2020-07-08 LAB — SURGICAL PATHOLOGY

## 2020-07-12 ENCOUNTER — Telehealth: Payer: Self-pay | Admitting: Nurse Practitioner

## 2020-07-12 NOTE — Telephone Encounter (Signed)
Spoke with patient regarding Palliative referral/services and she has declined services at this time.  Palliative referral will be cancelled.  Will notified MD and Palliative Team.

## 2020-07-13 ENCOUNTER — Inpatient Hospital Stay: Payer: Medicaid Other | Attending: Oncology

## 2020-07-13 ENCOUNTER — Inpatient Hospital Stay (HOSPITAL_BASED_OUTPATIENT_CLINIC_OR_DEPARTMENT_OTHER): Payer: Medicaid Other | Admitting: Oncology

## 2020-07-13 ENCOUNTER — Inpatient Hospital Stay: Payer: Medicaid Other

## 2020-07-13 ENCOUNTER — Encounter: Payer: Self-pay | Admitting: Oncology

## 2020-07-13 VITALS — BP 161/94 | HR 98 | Resp 18

## 2020-07-13 VITALS — BP 140/85 | HR 112 | Temp 98.5°F | Resp 16 | Wt 188.5 lb

## 2020-07-13 DIAGNOSIS — C50911 Malignant neoplasm of unspecified site of right female breast: Secondary | ICD-10-CM | POA: Insufficient documentation

## 2020-07-13 DIAGNOSIS — Z803 Family history of malignant neoplasm of breast: Secondary | ICD-10-CM | POA: Diagnosis not present

## 2020-07-13 DIAGNOSIS — Z171 Estrogen receptor negative status [ER-]: Secondary | ICD-10-CM | POA: Insufficient documentation

## 2020-07-13 DIAGNOSIS — Z5111 Encounter for antineoplastic chemotherapy: Secondary | ICD-10-CM | POA: Diagnosis not present

## 2020-07-13 DIAGNOSIS — C50919 Malignant neoplasm of unspecified site of unspecified female breast: Secondary | ICD-10-CM

## 2020-07-13 DIAGNOSIS — E538 Deficiency of other specified B group vitamins: Secondary | ICD-10-CM | POA: Diagnosis not present

## 2020-07-13 DIAGNOSIS — D509 Iron deficiency anemia, unspecified: Secondary | ICD-10-CM

## 2020-07-13 DIAGNOSIS — E559 Vitamin D deficiency, unspecified: Secondary | ICD-10-CM | POA: Diagnosis not present

## 2020-07-13 DIAGNOSIS — E876 Hypokalemia: Secondary | ICD-10-CM

## 2020-07-13 DIAGNOSIS — M7989 Other specified soft tissue disorders: Secondary | ICD-10-CM | POA: Diagnosis not present

## 2020-07-13 DIAGNOSIS — K219 Gastro-esophageal reflux disease without esophagitis: Secondary | ICD-10-CM | POA: Diagnosis not present

## 2020-07-13 DIAGNOSIS — C7951 Secondary malignant neoplasm of bone: Secondary | ICD-10-CM | POA: Diagnosis not present

## 2020-07-13 DIAGNOSIS — J910 Malignant pleural effusion: Secondary | ICD-10-CM | POA: Insufficient documentation

## 2020-07-13 LAB — COMPREHENSIVE METABOLIC PANEL
ALT: 18 U/L (ref 0–44)
AST: 27 U/L (ref 15–41)
Albumin: 3.1 g/dL — ABNORMAL LOW (ref 3.5–5.0)
Alkaline Phosphatase: 92 U/L (ref 38–126)
Anion gap: 8 (ref 5–15)
BUN: 12 mg/dL (ref 6–20)
CO2: 27 mmol/L (ref 22–32)
Calcium: 8.4 mg/dL — ABNORMAL LOW (ref 8.9–10.3)
Chloride: 105 mmol/L (ref 98–111)
Creatinine, Ser: 0.44 mg/dL (ref 0.44–1.00)
GFR, Estimated: >60 mL/min (ref >60)
Glucose, Bld: 144 mg/dL — ABNORMAL HIGH (ref 70–99)
Potassium: 3.3 mmol/L — ABNORMAL LOW (ref 3.5–5.1)
Sodium: 140 mmol/L (ref 135–145)
Total Bilirubin: 0.5 mg/dL (ref 0.3–1.2)
Total Protein: 6.4 g/dL — ABNORMAL LOW (ref 6.5–8.1)

## 2020-07-13 LAB — CBC WITH DIFFERENTIAL/PLATELET
Abs Immature Granulocytes: 0.04 10*3/uL (ref 0.00–0.07)
Basophils Absolute: 0 10*3/uL (ref 0.0–0.1)
Basophils Relative: 0 %
Eosinophils Absolute: 0.1 10*3/uL (ref 0.0–0.5)
Eosinophils Relative: 3 %
HCT: 31 % — ABNORMAL LOW (ref 36.0–46.0)
Hemoglobin: 9.6 g/dL — ABNORMAL LOW (ref 12.0–15.0)
Immature Granulocytes: 1 %
Lymphocytes Relative: 18 %
Lymphs Abs: 0.9 10*3/uL (ref 0.7–4.0)
MCH: 25.7 pg — ABNORMAL LOW (ref 26.0–34.0)
MCHC: 31 g/dL (ref 30.0–36.0)
MCV: 83.1 fL (ref 80.0–100.0)
Monocytes Absolute: 0.4 10*3/uL (ref 0.1–1.0)
Monocytes Relative: 7 %
Neutro Abs: 3.4 10*3/uL (ref 1.7–7.7)
Neutrophils Relative %: 71 %
Platelets: 237 10*3/uL (ref 150–400)
RBC: 3.73 MIL/uL — ABNORMAL LOW (ref 3.87–5.11)
RDW: 15.9 % — ABNORMAL HIGH (ref 11.5–15.5)
WBC: 4.8 10*3/uL (ref 4.0–10.5)
nRBC: 0 % (ref 0.0–0.2)

## 2020-07-13 LAB — VITAMIN D 25 HYDROXY (VIT D DEFICIENCY, FRACTURES): Vit D, 25-Hydroxy: 12.54 ng/mL — ABNORMAL LOW (ref 30–100)

## 2020-07-13 MED ORDER — SODIUM CHLORIDE 0.9 % IV SOLN
10.0000 mg | Freq: Once | INTRAVENOUS | Status: AC
  Administered 2020-07-13: 10 mg via INTRAVENOUS
  Filled 2020-07-13: qty 10

## 2020-07-13 MED ORDER — FAMOTIDINE IN NACL 20-0.9 MG/50ML-% IV SOLN
20.0000 mg | Freq: Once | INTRAVENOUS | Status: AC
  Administered 2020-07-13: 20 mg via INTRAVENOUS
  Filled 2020-07-13: qty 50

## 2020-07-13 MED ORDER — SODIUM CHLORIDE 0.9 % IV SOLN
Freq: Once | INTRAVENOUS | Status: AC
  Filled 2020-07-13: qty 250

## 2020-07-13 MED ORDER — SODIUM CHLORIDE 0.9 % IV SOLN
80.0000 mg/m2 | Freq: Once | INTRAVENOUS | Status: AC
  Administered 2020-07-13: 162 mg via INTRAVENOUS
  Filled 2020-07-13: qty 27

## 2020-07-13 MED ORDER — FERROUS SULFATE 325 (65 FE) MG PO TBEC: 325.0000 mg | DELAYED_RELEASE_TABLET | Freq: Two times a day (BID) | ORAL | 1 refills | Status: DC

## 2020-07-13 MED ORDER — DIPHENHYDRAMINE HCL 50 MG/ML IJ SOLN
50.0000 mg | Freq: Once | INTRAMUSCULAR | Status: AC
  Administered 2020-07-13: 50 mg via INTRAVENOUS
  Filled 2020-07-13: qty 1

## 2020-07-13 MED ORDER — HEPARIN SOD (PORK) LOCK FLUSH 100 UNIT/ML IV SOLN
INTRAVENOUS | Status: AC
  Filled 2020-07-13: qty 5

## 2020-07-13 MED ORDER — POTASSIUM CHLORIDE IN NACL 20-0.9 MEQ/L-% IV SOLN
Freq: Once | INTRAVENOUS | Status: AC
  Filled 2020-07-13: qty 1000

## 2020-07-13 MED ORDER — HEPARIN SOD (PORK) LOCK FLUSH 100 UNIT/ML IV SOLN
500.0000 [IU] | Freq: Once | INTRAVENOUS | Status: AC | PRN
  Administered 2020-07-13: 500 [IU]
  Filled 2020-07-13: qty 5

## 2020-07-13 NOTE — Progress Notes (Signed)
Per Benjamine Mola, RN, per Dr. Tasia Catchings okay to proceed with treatment today with HR 112. Patient is to get 20 mEq of Potassium as well.   Patient tolerated infusion well. Discharged home.

## 2020-07-13 NOTE — Progress Notes (Signed)
Patient had bilateral ankle edema yesterday with L>R that has improved today.

## 2020-07-13 NOTE — Progress Notes (Signed)
Hematology/Oncology Follow Up Note [facility]  [phone] [phone]  Patient Care Team: System, Provider Not In as PCP - General Rico Junker, RN as Registered Nurse   Name of the patient: Jessica Willis  [id number]  07-26-1966   REASON FOR VISIT  follow-up for triple negative breast cancer  INTERVAL HISTORY 54 y.o. with past medical history including anemia, family history of breast cancer presents for follow-up of management of metastatic triple negative breast cancer.  06/15/2020-07/01/2020 Patient was admitted due to shortness of breath.  Also reported to have right breast cancer which she has noticed for about at least 6 months.  Initial x-ray and a CT showed massive right-sided pleural effusion , mass-effect and shift to the midline, near complete complex of the right lung, right breast mass extending to the skin surface associated with skin thickening and right axillary lymphadenopathy. Patient had initially pleural catheter placement later chest tube placement. Patient was admitted to ICU due to respiratory failure.  She was also seen by Dr. Genevive Bi initially and there was an attempt for tac pleurodesis which was not successful.  Chest tube was removed. Patient's respiratory status improved. Initial pleural effusion cytology came back positive for adenocarcinoma compatible with breast origin.  Triple negative due to lack of tissue for additional testing.  Patient admitted breast mass biopsy as well as Mediport placement by Dr. Lysle Pearl.  Patient was given first dose of Taxol 80 mg/m during her admission and she tolerated well. Patient was discharged on 07/01/2020.  Respiratory status improved and she was discharged home without home oxygen.  # MRI brain showed no intracranial brain metastasis.  1 cm metastasis affecting the clivus.  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  patient was accompanied by her son.  She reports  feeling well since discharge.  No shortness of breath.  Patient denies any pain.  She takes Tylenol if needed for discomforts at the right thoracentesis and breast biopsy sites.  She denies any headache, neck or back pain. Her appetite is fair.  INTERVAL HISTORY Jessica Willis is a 54 y.o. female who has above history reviewed by me today presents for follow up visit for management of triple negative breast cancer Problems and complaints are listed below: Patient is status post 2 doses of weekly Taxol.  NGS is pending.  Today he reports feeling well.  No nausea vomiting diarrhea.  Appetite is fair.  She has discussed with genetic counselor and wants to further consider genetic testing. She is doing her own dressing change for right breast. She noticed bilateral lower extremity swelling, left more than right.  Some of the swelling goes away in the morning.  But ankle swelling persists Review of Systems  Constitutional: Positive for fatigue. Negative for appetite change, chills and fever.  HENT:   Negative for hearing loss and voice change.   Eyes: Negative for eye problems.  Respiratory: Negative for chest tightness, cough and shortness of breath.   Cardiovascular: Positive for leg swelling. Negative for chest pain.  Gastrointestinal: Negative for abdominal distention, abdominal pain and blood in stool.  Endocrine: Negative for hot flashes.  Genitourinary: Negative for difficulty urinating and frequency.   Musculoskeletal: Negative for arthralgias.  Skin: Negative for itching and rash.  Neurological: Negative for extremity weakness.  Hematological: Negative for adenopathy.  Psychiatric/Behavioral: Negative for confusion.      No Known Allergies   Past Medical History:  Diagnosis Date  . Anemia   . Family history  of breast cancer   . Patient denies medical problems      Past Surgical History:  Procedure Laterality Date  . BREAST BIOPSY  06/28/2020   Procedure: BREAST  BIOPSY;  Surgeon: Benjamine Sprague, DO;  Location: [facility] ORS;  Service: General;;  . PORTACATH PLACEMENT N/A 06/28/2020   Procedure: INSERTION PORT-A-CATH;  Surgeon: Benjamine Sprague, DO;  Location: [facility] ORS;  Service: General;  Laterality: N/A;  . TUBAL LIGATION    . VIDEO ASSISTED THORACOSCOPY (VATS)/THOROCOTOMY Right 06/23/2020   Procedure: ATTEMPTED VIDEO ASSISTED THORACOSCOPY (VATS);  Surgeon: Nestor Lewandowsky, MD;  Location: [facility] ORS;  Service: General;  Laterality: Right;    Social History   Socioeconomic History  . Marital status: Single    Spouse name: Not on file  . Number of children: Not on file  . Years of education: Not on file  . Highest education level: Not on file  Occupational History  . Not on file  Tobacco Use  . Smoking status: Never Smoker  . Smokeless tobacco: Never Used  Substance and Sexual Activity  . Alcohol use: Not Currently    Alcohol/week: 1.0 standard drink    Types: 1 Cans of beer per week  . Drug use: No  . Sexual activity: Not on file  Other Topics Concern  . Not on file  Social History Narrative  . Not on file   Social Determinants of Health   Financial Resource Strain:   . Difficulty of Paying Living Expenses: Not on file  Food Insecurity:   . Worried About Charity fundraiser in the Last Year: Not on file  . Ran Out of Food in the Last Year: Not on file  Transportation Needs:   . Lack of Transportation (Medical): Not on file  . Lack of Transportation (Non-Medical): Not on file  Physical Activity:   . Days of Exercise per Week: Not on file  . Minutes of Exercise per Session: Not on file  Stress:   . Feeling of Stress : Not on file  Social Connections:   . Frequency of Communication with Friends and Family: Not on file  . Frequency of Social Gatherings with Friends and Family: Not on file  . Attends Religious Services: Not on file  . Active Member of Clubs or Organizations: Not on file  . Attends Archivist Meetings: Not on file   . Marital Status: Not on file  Intimate Partner Violence:   . Fear of Current or Ex-Partner: Not on file  . Emotionally Abused: Not on file  . Physically Abused: Not on file  . Sexually Abused: Not on file    Family History  Problem Relation Age of Onset  . Diabetes Other   . Hypertension Other   . Diabetes Maternal Aunt   . Breast cancer Cousin        dx 21s  . Breast cancer Cousin        dx 6s     Current Outpatient Medications:  .  lidocaine-prilocaine (EMLA) cream, Apply 1 application topically as needed. Place a small amount of cream to port site prior to each chemo treatment, 1-2 hours before treatment., Disp: 30 g, Rfl: 0 .  ondansetron (ZOFRAN) 8 MG tablet, Take 1 tablet (8 mg total) by mouth every 8 (eight) hours as needed for up to 14 days for nausea or vomiting., Disp: 42 tablet, Rfl: 0 .  pantoprazole (PROTONIX) 40 MG tablet, Take 1 tablet (40 mg total) by mouth daily., Disp: 30  tablet, Rfl: 0 .  Vitamin D3 (VITAMIN D) 25 MCG tablet, Take 1 tablet (1,000 Units total) by mouth daily., Disp: 30 tablet, Rfl: 0 .  calcium carbonate (CALCIUM 600) 600 MG TABS tablet, Take 2 tablets (1,200 mg total) by mouth daily with breakfast. (Patient not taking: Reported on 07/13/2020), Disp: 60 tablet, Rfl: 1 .  ferrous sulfate 325 (65 FE) MG EC tablet, Take 1 tablet (325 mg total) by mouth 2 (two) times daily with a meal., Disp: 60 tablet, Rfl: 1 .  folic acid (FOLVITE) 1 MG tablet, Take 1 tablet (1 mg total) by mouth daily. (Patient not taking: Reported on 07/13/2020), Disp: 60 tablet, Rfl: 0  Physical exam:  Vitals:   07/13/20 0856  BP: 140/85  Pulse: (!) 112  Resp: 16  Temp: 98.5 F (36.9 C)  SpO2: 100%  Weight: 188 lb 8 oz (85.5 kg)   Physical Exam Constitutional:      General: She is not in acute distress.    Comments: Thin built  HENT:     Head: Normocephalic and atraumatic.  Eyes:     General: No scleral icterus. Cardiovascular:     Rate and Rhythm: Normal rate and  regular rhythm.     Heart sounds: Normal heart sounds.  Pulmonary:     Effort: Pulmonary effort is normal. No respiratory distress.     Breath sounds: No wheezing.  Abdominal:     General: Bowel sounds are normal. There is no distension.     Palpations: Abdomen is soft.  Musculoskeletal:        General: Swelling present. No deformity. Normal range of motion.     Cervical back: Normal range of motion and neck supple.  Skin:    General: Skin is warm and dry.     Findings: No erythema or rash.     Comments: + Left anterior chest wall Mediport.  Neurological:     Mental Status: She is alert and oriented to person, place, and time. Mental status is at baseline.     Cranial Nerves: No cranial nerve deficit.     Coordination: Coordination normal.  Psychiatric:        Mood and Affect: Mood normal.   Large right breast mass with skin involvement.  CMP Latest Ref Rng & Units 07/13/2020  Glucose 70 - 99 mg/dL 144(H)  BUN 6 - 20 mg/dL 12  Creatinine 0.44 - 1.00 mg/dL 0.44  Sodium 135 - 145 mmol/L 140  Potassium 3.5 - 5.1 mmol/L 3.3(L)  Chloride 98 - 111 mmol/L 105  CO2 22 - 32 mmol/L 27  Calcium 8.9 - 10.3 mg/dL 8.4(L)  Total Protein 6.5 - 8.1 g/dL 6.4(L)  Total Bilirubin 0.3 - 1.2 mg/dL 0.5  Alkaline Phos 38 - 126 U/L 92  AST 15 - 41 U/L 27  ALT 0 - 44 U/L 18   CBC Latest Ref Rng & Units 07/13/2020  WBC 4.0 - 10.5 K/uL 4.8  Hemoglobin 12.0 - 15.0 g/dL 9.6(L)  Hematocrit 36 - 46 % 31.0(L)  Platelets 150 - 400 K/uL 237    RADIOGRAPHIC STUDIES: I have personally reviewed the radiological images as listed and agreed with the findings in the report. DG Chest 1 View  Result Date: 06/28/2020 CLINICAL DATA:  Status port MediPort catheter placement EXAM: CHEST  1 VIEW COMPARISON:  June 23, 2020 FINDINGS: The heart size and mediastinal contours are unchanged with mild cardiomegaly. There is prominence of the central pulmonary vasculature. Again noted is patchy airspace  opacity  throughout the right lung with a probable loculated moderate pleural effusion unchanged from prior exam. Left perihilar hazy airspace opacity is noted. A left-sided MediPort catheter seen with the tip at the right atrium. No acute osseous abnormality. IMPRESSION: Left MediPort catheter tip just at the right atrium. No other change in the bilateral perihilar and patchy airspace opacities Loculated right pleural effusion Electronically Signed   By: Prudencio Pair M.D.   On: 06/28/2020 15:31   DG Chest 1 View  Result Date: 06/16/2020 CLINICAL DATA:  Hemothorax on the right. EXAM: CHEST  1 VIEW COMPARISON:  Same day chest radiograph. FINDINGS: A right-sided pleural pigtail catheter is redemonstrated. There is interval improvement in aeration of the right lung with decreased size of a right hemothorax. A small right pneumothorax is not significantly changed in size. Severe right atelectasis/airspace disease is redemonstrated. Mild interstitial opacities in the left lung appear unchanged. There is no left pleural effusion or left pneumothorax. The cardiac silhouette is partially obscured but appears unchanged. IMPRESSION: 1. Interval improvement in aeration of the right lung with decreased size of a right hemothorax. 2. Unchanged small right pneumothorax with pleural pigtail catheter in place. 3. Severe right atelectasis/airspace disease. Electronically Signed   By: Zerita Boers M.D.   On: 06/16/2020 14:52   DG Chest 1 View  Result Date: 06/16/2020 CLINICAL DATA:  Cough and phlegm production. EXAM: CHEST  1 VIEW COMPARISON:  Chest radiograph dated 06/15/2020. FINDINGS: The cardiac silhouette is obscured. A right-sided pleural pigtail catheter is in unchanged location and there has been interval decrease in a large right pleural fluid collection (effusion versus hemothorax). There is no longer mediastinal shift to the left. There is a small right pneumothorax. Severe airspace and interstitial opacities are seen on  the right. Mild interstitial opacities are seen on the left. There is no left pleural effusion or pneumothorax. The osseous structures are intact. IMPRESSION: 1. Small right pneumothorax with a right-sided pleural pigtail catheter in place. 2. Interval decrease in a large right pleural fluid collection (effusion versus hemothorax). 3. Severe airspace and interstitial opacities on the right. Mild interstitial opacities in the left. Electronically Signed   By: Zerita Boers M.D.   On: 06/16/2020 14:28   DG Chest 1 View  Result Date: 06/15/2020 CLINICAL DATA:  Short of breath, cough EXAM: CHEST  1 VIEW COMPARISON:  06/29/2015 FINDINGS: 2 frontal views of the chest demonstrate complete opacification of the right hemithorax, with slight mediastinal shift to the left, consistent with large right pleural effusion and underlying lung consolidation. Chronic areas of scarring are seen within the left chest. No pneumothorax. Cardiac silhouette is unremarkable. No acute bony abnormalities. IMPRESSION: 1. Opacification of the right hemithorax consistent with large right pleural effusion and underlying right lung consolidation. Electronically Signed   By: Randa Ngo M.D.   On: 06/15/2020 21:42   CT Angio Chest PE W and/or Wo Contrast  Result Date: 06/15/2020 CLINICAL DATA:  Cough, shortness of breath EXAM: CT ANGIOGRAPHY CHEST WITH CONTRAST TECHNIQUE: Multidetector CT imaging of the chest was performed using the standard protocol during bolus administration of intravenous contrast. Multiplanar CT image reconstructions and MIPs were obtained to evaluate the vascular anatomy. CONTRAST:  100 mL Omnipaque 350 IV COMPARISON:  06/30/2015 FINDINGS: Cardiovascular: No filling defects in the pulmonary arteries to suggest pulmonary emboli. Heart is normal size. Aorta normal caliber. Mediastinum/Nodes: No visible mediastinal or hilar adenopathy. Bilateral axillary adenopathy, right greater than left. Index right axillary lymph  node  has a short axis diameter of 13 mm. Left axillary lymph node has a short axis diameter of 11 mm. Lungs/Pleura: Large right pleural effusion completely compressed seen in the right lung which is not aerated. This appears to have mass effect with shift of the right lung and mediastinal structures to the left. Areas of atelectasis or scarring in the left lung base. No effusion on the left. Upper Abdomen: Imaging into the upper abdomen demonstrates no acute findings. Musculoskeletal: Large mass noted in the right breast measuring 8 x 6 cm. Possible separate right breast mass more inferiorly in the right breast measuring 8 x 4 cm. Skin thickening or edema noted. Associated right axillary adenopathy. A few ill-defined sclerotic areas within the thoracic spine could reflect metastases. Review of the MIP images confirms the above findings. IMPRESSION: No evidence of pulmonary embolus. One, possibly 2 separate large masses within the right breast extending to the skin surface with associated adjacent skin thickening and right axillary adenopathy. Findings compatible with right breast cancer. Large right pleural effusion which appears to have mass effect with complete collapse/compression of the right lung and shift of mediastinal structures to the left. Single enlarged left axillary lymph node. These results were called by telephone at the time of interpretation on 06/15/2020 at 10:43 pm to provider [facility] , who verbally acknowledged these results. Electronically Signed   By: Rolm Baptise M.D.   On: 06/15/2020 22:46   MR BRAIN W WO CONTRAST  Result Date: 06/24/2020 CLINICAL DATA:  History of metastatic breast cancer.  Staging. EXAM: MRI HEAD WITHOUT AND WITH CONTRAST TECHNIQUE: Multiplanar, multiecho pulse sequences of the brain and surrounding structures were obtained without and with intravenous contrast. CONTRAST:  38m GADAVIST GADOBUTROL 1 MMOL/ML IV SOLN COMPARISON:  None. FINDINGS: Brain: The brain has  a normal appearance without evidence of malformation, atrophy, old or acute small or large vessel infarction, mass lesion, hemorrhage, hydrocephalus or extra-axial collection. After contrast administration, no enhancement the brain or leptomeninges occurs. Vascular: Major vessels at the base of the brain show flow. Venous sinuses appear patent. Skull and upper cervical spine: 1 cm metastasis affecting the clivus. Scattered metastases in the upper cervical spine. Sinuses/Orbits: Clear/normal. Other: None significant. IMPRESSION: 1. Normal appearance of the brain itself. No evidence of metastatic disease to the brain or leptomeninges. 2. 1 cm metastasis affecting the clivus. Scattered metastases in the upper cervical spine. Electronically Signed   By: MNelson ChimesM.D.   On: 06/24/2020 11:53   UKoreaVenous Img Lower Unilateral Left (DVT)  Result Date: 06/16/2020 CLINICAL DATA:  Initial evaluation for acute left lower extremity edema. EXAM: LEFT LOWER EXTREMITY VENOUS DOPPLER ULTRASOUND TECHNIQUE: Gray-scale sonography with graded compression, as well as color Doppler and duplex ultrasound were performed to evaluate the lower extremity deep venous systems from the level of the common femoral vein and including the common femoral, femoral, profunda femoral, popliteal and calf veins including the posterior tibial, peroneal and gastrocnemius veins when visible. The superficial great saphenous vein was also interrogated. Spectral Doppler was utilized to evaluate flow at rest and with distal augmentation maneuvers in the common femoral, femoral and popliteal veins. COMPARISON:  None. FINDINGS: Contralateral Common Femoral Vein: Respiratory phasicity is normal and symmetric with the symptomatic side. No evidence of thrombus. Normal compressibility. Common Femoral Vein: No evidence of thrombus. Normal compressibility, respiratory phasicity and response to augmentation. Saphenofemoral Junction: No evidence of thrombus. Normal  compressibility and flow on color Doppler imaging. Profunda Femoral Vein: No  evidence of thrombus. Normal compressibility and flow on color Doppler imaging. Femoral Vein: No evidence of thrombus. Normal compressibility, respiratory phasicity and response to augmentation. Popliteal Vein: No evidence of thrombus. Normal compressibility, respiratory phasicity and response to augmentation. Calf Veins: No evidence of thrombus. Normal compressibility and flow on color Doppler imaging. Superficial Great Saphenous Vein: No evidence of thrombus. Normal compressibility. Venous Reflux:  None. Other Findings:  None. IMPRESSION: No evidence of deep venous thrombosis. Electronically Signed   By: Jeannine Boga M.D.   On: 06/16/2020 01:30   DG Chest Port 1 View  Result Date: 06/23/2020 CLINICAL DATA:  Status post video-assisted thoracoscopy. EXAM: PORTABLE CHEST 1 VIEW COMPARISON:  June 22, 2020. FINDINGS: Stable cardiomegaly central pulmonary vascular congestion is noted. Right-sided chest tube has been removed. No pneumothorax is noted. Stable diffuse right lung opacity is noted concerning for pneumonia with associated pleural effusion, including loculated fluid and right lung apex. Small loculated left pleural effusion is noted laterally. Left perihilar and basilar opacity is noted concerning for infiltrate or possibly edema. Bony thorax is unremarkable. IMPRESSION: Stable diffuse right lung opacity is noted concerning for pneumonia with associated pleural effusion, including loculated fluid and right lung apex. Small loculated left pleural effusion is noted laterally. Left perihilar and basilar opacity is noted concerning for infiltrate or possibly edema. Electronically Signed   By: Marijo Conception M.D.   On: 06/23/2020 14:59   DG Chest Port 1 View  Result Date: 06/22/2020 CLINICAL DATA:  Preoperative chest evaluation EXAM: PORTABLE CHEST 1 VIEW COMPARISON:  06/19/2020 FINDINGS: Chest tube remains in place  on the right. Somewhat less pleural density on the right. There may be a small amount of air in the pleural space. Persistent diffuse infiltrate in the right lung. Patchy infiltrate in the mid and lower lung on the left is somewhat improved. IMPRESSION: 1. Chest tube remains in place on the right. Somewhat less pleural fluid. Question small amount of air in the pleural space. 2. Persistent diffuse infiltrate on the right. 3. Improved aeration in the left mid and lower lung. Electronically Signed   By: Nelson Chimes M.D.   On: 06/22/2020 14:43   DG Chest Port 1 View  Result Date: 06/19/2020 CLINICAL DATA:  Chest tube in place EXAM: PORTABLE CHEST 1 VIEW COMPARISON:  Chest radiograph from one day prior. FINDINGS: Right basilar pigtail chest tube is stable. Stable cardiomediastinal silhouette with mild cardiomegaly. No pneumothorax. Near complete opacification of the right hemithorax with minimally improved central right lung aeration. Extensive fluffy left parahilar lung opacities, slightly worsened. Left retrocardiac consolidation is stable. IMPRESSION: 1. Stable right basilar chest tube without pneumothorax. 2. Near complete opacification of the right hemithorax with minimally improved central right lung aeration, which could be due to any combination of pleural effusion, atelectasis, pneumonia, edema or mass. 3. Stable left retrocardiac consolidation, favor atelectasis. 4. Fluffy left parahilar lung opacities, slightly worsened, favor pulmonary edema. Electronically Signed   By: Ilona Sorrel M.D.   On: 06/19/2020 15:04   DG CHEST PORT 1 VIEW  Result Date: 06/18/2020 CLINICAL DATA:  Malignant pleural effusion, RIGHT-side chest tube EXAM: PORTABLE CHEST 1 VIEW COMPARISON:  Portable exam 1042 hours compared to 11 x 21 FINDINGS: Pigtail RIGHT thoracostomy tube again seen. Persistent subtotal opacification of the RIGHT hemithorax by effusion and atelectasis, underlying tumor not excluded. Atelectasis versus  consolidation of LEFT lower lobe with probable small pleural effusion. Stable heart size. No acute osseous findings. IMPRESSION: Persistent subtotal opacification of  the RIGHT hemithorax by effusion and atelectasis, underlying tumor not excluded. Persistent infiltrate and probable pleural effusion at inferior LEFT chest. Electronically Signed   By: Lavonia Dana M.D.   On: 06/18/2020 16:09   DG Chest Port 1 View  Result Date: 06/17/2020 CLINICAL DATA:  Chest x-ray 06/17/2020 5 a.m. EXAM: PORTABLE CHEST 1 VIEW COMPARISON:  Chest x-ray 06/17/2020 4:10 a.m. FINDINGS: Interval increase in size of a likely large right pleural effusion with slight aeration of the right lung. Likely trace pleural effusion on the left. Interval increase in airspace interstitial opacities of the left lung that is most prominent in the left mid lung zone. Right pigtail chest tube overlies the right hemithorax. IMPRESSION: 1. Slightly improved almost complete whiteout of the right chest with slightly decreased in size large pleural effusion. 2. Interval increase in interstitial and alveolar left lung opacities with possible trace left pleural effusion. Electronically Signed   By: Iven Finn M.D.   On: 06/17/2020 21:30   DG Chest Port 1 View  Result Date: 06/17/2020 CLINICAL DATA:  Pleural effusion EXAM: PORTABLE CHEST 1 VIEW COMPARISON:  Yesterday FINDINGS: More dense and homogeneous right sided opacity. Right pleural catheter in stable position. Left lower lobe opacity which also appears increased. Likely no cardiomegaly when allowing for mediastinal shift. IMPRESSION: White out of the right chest with mediastinal mass effect from large pleural effusion. Retrocardiac infiltrate that could be infection or atelectasis. Electronically Signed   By: Monte Fantasia M.D.   On: 06/17/2020 05:00   DG Chest Portable 1 View  Result Date: 06/15/2020 CLINICAL DATA:  Right chest tube placement. EXAM: PORTABLE CHEST 1 VIEW COMPARISON:   Radiograph and CT earlier today. FINDINGS: Placement of right pigtail catheter. There is persistent complete opacification of the right hemithorax. There may be slight diminished mediastinal shift to the left. No visualized pneumothorax. No focal abnormality in the left lung. IMPRESSION: Placement of right pigtail catheter with persistent complete opacification of the right hemithorax. There may be slight diminished mediastinal shift to the left. No visualized pneumothorax. Electronically Signed   By: Keith Rake M.D.   On: 06/15/2020 23:40   DG C-Arm 1-60 Min-No Report  Result Date: 06/28/2020 Fluoroscopy was utilized by the requesting physician.  No radiographic interpretation.   MR TOTAL SPINE METS SCREENING  Result Date: 06/25/2020 CLINICAL DATA:  Metastatic breast cancer. EXAM: MRI TOTAL SPINE WITHOUT AND WITH CONTRAST TECHNIQUE: Multisequence MR imaging of the spine from the cervical spine to the sacrum was performed prior to and following IV contrast administration for evaluation of spinal metastatic disease. Large field of view sagittal imaging was performed. No axial imaging was obtained. CONTRAST:  39m GADAVIST GADOBUTROL 1 MMOL/ML IV SOLN COMPARISON:  Head MRI 06/24/2020. Chest CTA 06/15/2020. CT abdomen and pelvis 06/25/2015. FINDINGS: MRI CERVICAL SPINE FINDINGS Alignment: Cervical spine straightening.  No significant listhesis. Vertebrae: Heterogeneous bone marrow signal throughout the cervical spine with patchy areas of enhancement consistent with osseous metastatic disease. No evidence of epidural tumor. Cord: Normal signal.  No abnormal intradural enhancement. Posterior Fossa, vertebral arteries, paraspinal tissues: Posterior fossa more fully evaluated on the recent head MRI. No gross paraspinal mass on this study which does not include axial images. Disc levels: Moderate to severe disc space narrowing from C4-5 to C7-T1 with degenerative endplate changes, disc bulging, and spurring.  Assessment for degenerative stenosis is limited in the absence of axial imaging. No compressive spinal stenosis. MRI THORACIC SPINE FINDINGS Alignment: Slight thoracic levoscoliosis. No significant listhesis.  Vertebrae: Heterogeneous bone marrow signal throughout the thoracic spine with patchy areas of enhancement consistent with osseous metastatic disease. No evidence of epidural tumor. Cord:  Normal signal.  No abnormal intradural enhancement. Paraspinal and other soft tissues: Partially visualized known large right pleural effusion as well as a small left pleural effusion. Partially visualized known right breast mass. Disc levels: Mild-to-moderate diffuse thoracic disc degeneration with disc bulging. No compressive stenosis. MRI LUMBAR SPINE FINDINGS Segmentation:  Standard. Alignment:  Trace retrolisthesis of L2 on L3. Vertebrae: Heterogeneous bone marrow signal throughout the lumbar spine and sacrum with patchy areas of enhancement consistent with osseous metastatic disease. No evidence of epidural tumor. Conus medullaris: Extends to the L1 level and appears normal. Paraspinal and other soft tissues: No gross paraspinal mass on this study which does not include axial images. Disc levels: Focally advanced disc space narrowing at L2-3 with degenerative endplate changes. Mild disc bulging at L2-3, L3-4, and L4-5 without evidence of compressive stenosis. IMPRESSION: 1. Widespread osseous metastatic disease. No evidence of epidural tumor. 2. Partial coverage of known right breast mass and large right and small left pleural effusions. Electronically Signed   By: Logan Bores M.D.   On: 06/25/2020 15:12     Assessment and plan Patient is a 54 y.o. female presents for follow-up of metastatic triple negative breast cancer 1. Metastatic breast cancer ([place])   2. Encounter for antineoplastic chemotherapy   3. Bone metastasis ([place])   4. Family history of breast cancer   5. Folate deficiency   6. Swelling of lower  leg   7. Iron deficiency anemia, unspecified iron deficiency anemia type   8. Vitamin D deficiency   9. 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 Status post 1 dose of Taxol 80 mg/m.  She tolerates well. Labs are reviewed and discussed with patient. She will proceed with Taxol 80 mg/m on 07/06/2020. NGS is pending.  If PD-L1 CPS >10, plan to add [place]  Recommend patient to continue follow-up with palliative service. She has met with Dietitian.  I had another discussion with her today and she agrees with proceeding with genetic testing.  I instructed RN to notify genetic counselor. PET scan is scheduled to complete staging.  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  #Bone metastasis, status post 1 dose of Zometa. Recommend patient to take calcium and vitamin D supplementation. Checking vitamin D level today.-Vitamin D level is low.  Patient has vitamin D deficiency.  I recommend patient to take vitamin D 50,000 weekly x6.  Prescription will be sent to pharmacy. # Folate deficiency, continue folic acid #Hypokalemia, patient will receive 1 L of IV fluid today with 20 mEq KCl.  Recommend patient to take potassium enriched food. # Iron deficiency anemia s/p IV venofer x 2 Recommend patient to start oral iron supplementation ferrous sulfate 325 mg twice daily.  Monitor anemia. #Leg swelling, I will check bilateral lower extremity ultrasound to rule out DVT.  Recommend patient to elevate her legs.  #Neoplasm related pain, patient denies any pain at this point.Marland Kitchen #GERD, continue Protonix 40 mg daily.  Orders Placed This Encounter  Procedures  . US Venous Img Lower Bilateral    Standing Status:   Future    Standing Expiration Date:   07/13/2021    Order Specific Question:   Reason for Exam (SYMPTOM  OR DIAGNOSIS REQUIRED)    Answer:   bilateral lower extremity swelling    Order Specific  Question:   Preferred imaging  location?    Answer:   [facility]     follow up in 1 week   Earlie Server, MD, PhD Hematology Oncology [facility] Pager- [phone] 07/13/2020

## 2020-07-14 ENCOUNTER — Telehealth: Payer: Self-pay

## 2020-07-14 MED ORDER — ERGOCALCIFEROL 1.25 MG (50000 UT) PO CAPS: 50000.0000 [IU] | ORAL_CAPSULE | ORAL | 0 refills | Status: AC

## 2020-07-14 NOTE — Telephone Encounter (Signed)
-----   Message from Earlie Server, MD sent at 07/13/2020  9:22 PM EST ----- Let her know about Vit D deficiency. Please send her a Rx of vitamin D 50,000 unit weekly x 6.( weekly, not daily) [facility]?

## 2020-07-14 NOTE — Telephone Encounter (Signed)
OK to fill rx with [facility].  Patient is aware and wants rx sent to [facility].

## 2020-07-15 ENCOUNTER — Ambulatory Visit
Admission: RE | Admit: 2020-07-15 | Discharge: 2020-07-15 | Disposition: A | Discharge status: Home / Self Care | Payer: Medicaid Other | Source: Ambulatory Visit | Attending: Oncology | Admitting: Oncology

## 2020-07-15 ENCOUNTER — Other Ambulatory Visit: Payer: Self-pay

## 2020-07-15 DIAGNOSIS — M7989 Other specified soft tissue disorders: Secondary | ICD-10-CM | POA: Diagnosis present

## 2020-07-15 IMAGING — US US EXTREM LOW VENOUS
1 series · 13 of 24 positions shown · non-contrast
Comparison: None.

CLINICAL DATA: 54-year-old female with a history of bilateral lower
extremity swelling



[Series 1: us venous img lower bilat (dvt) · portal-venous · 13 of 60 slices shown]
[im 1/60]
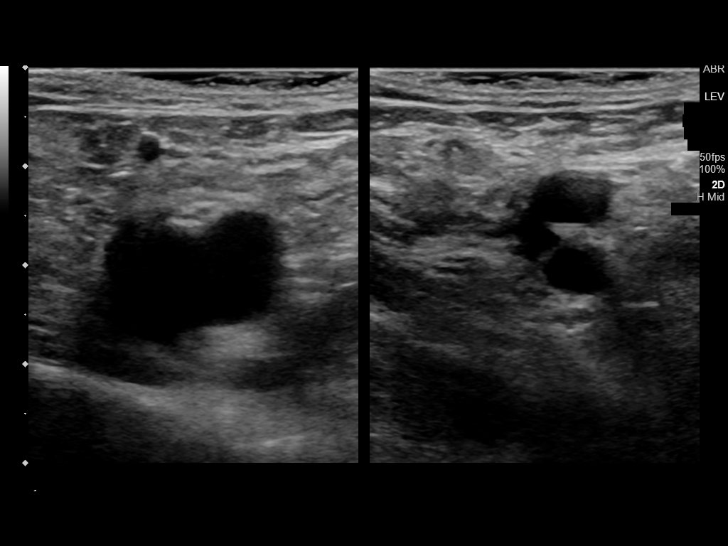
[im 6/60]
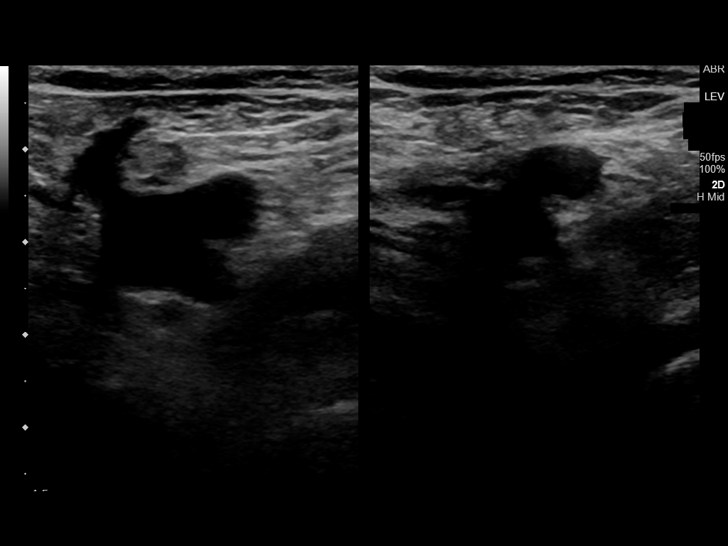
[im 11/60]
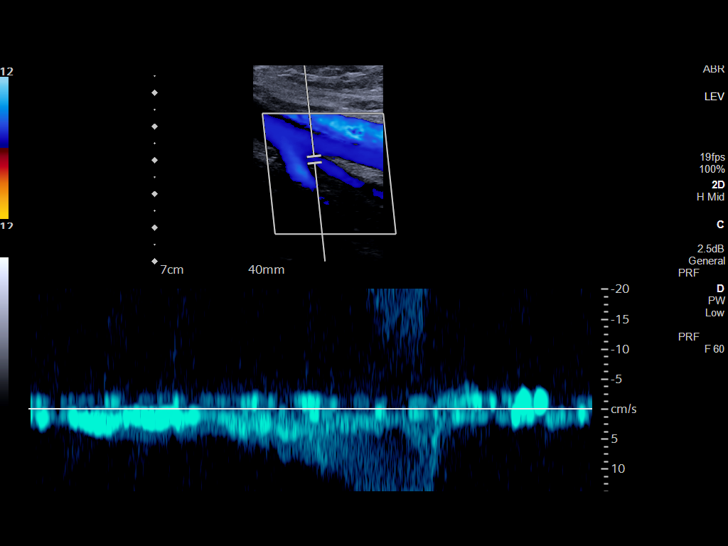
[im 16/60]
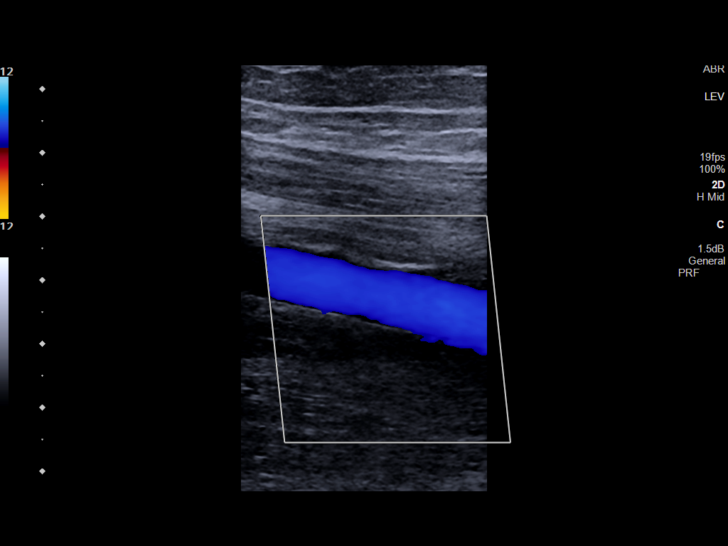
[im 21/60]
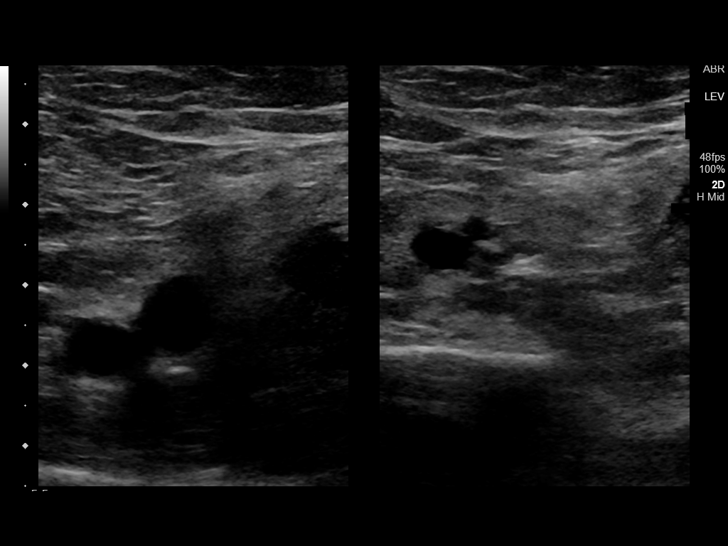
[im 26/60]
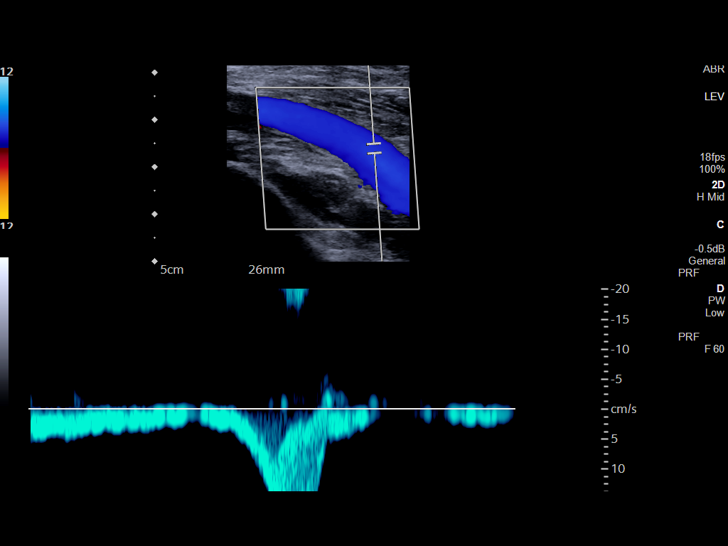
[im 31/60]
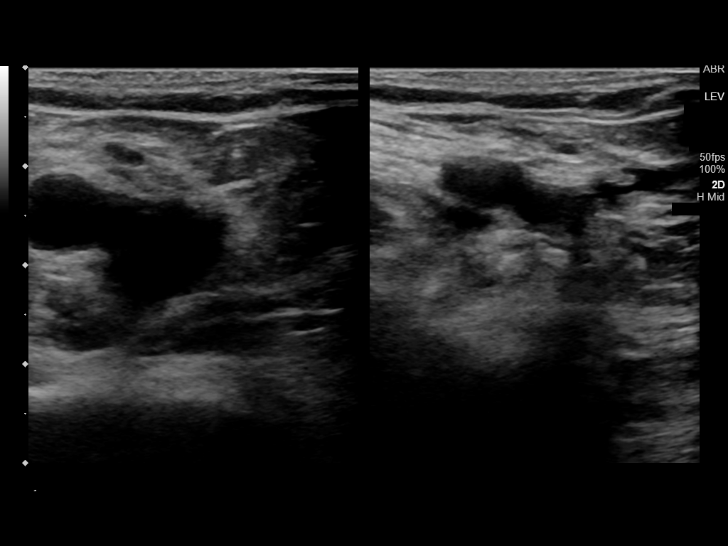
[im 34/60]
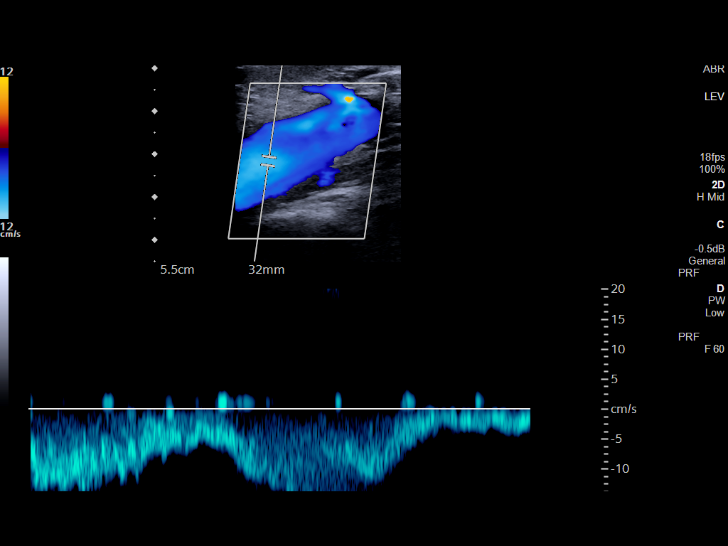
[im 39/60]
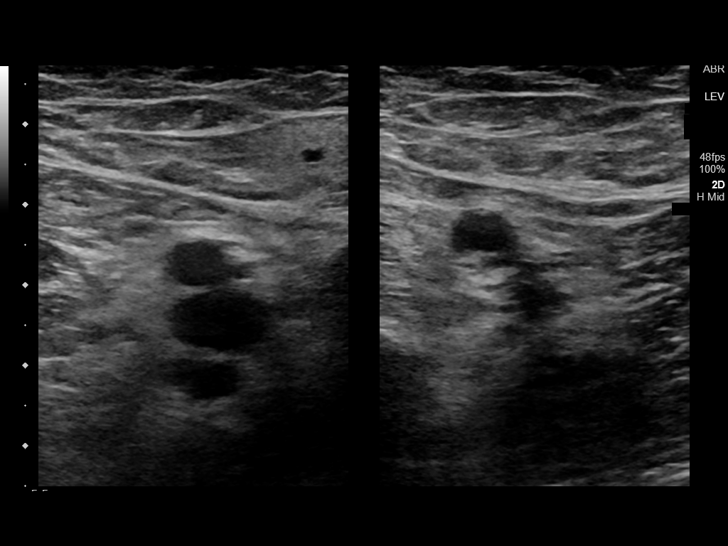
[im 44/60]
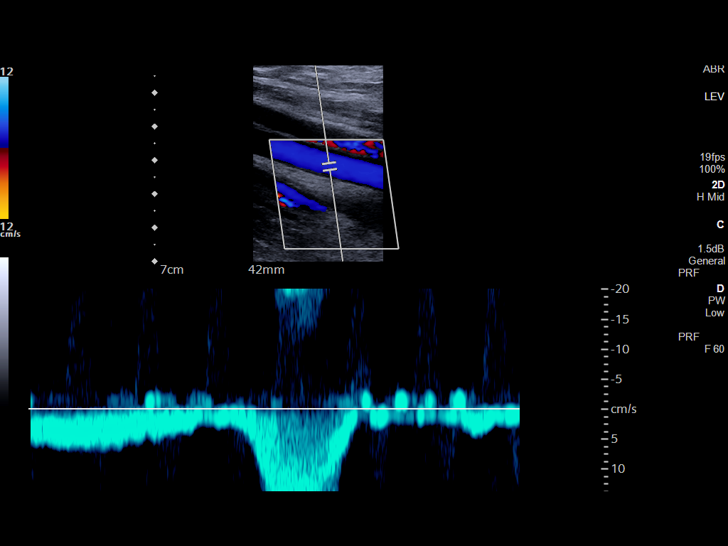
[im 49/60]
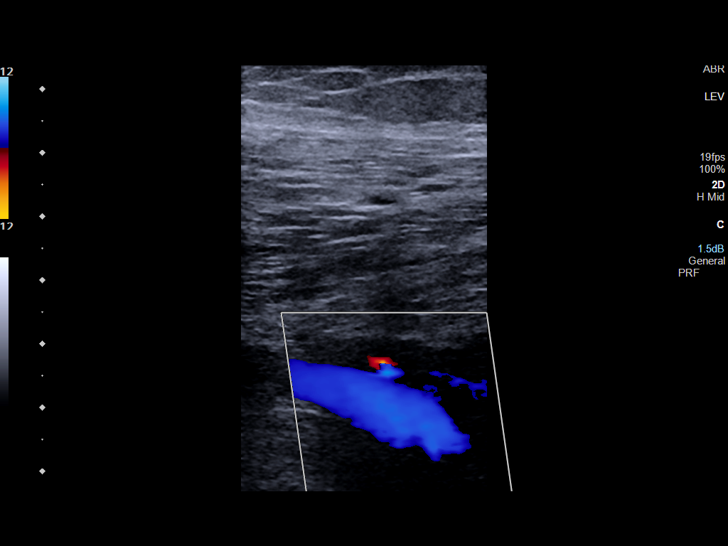
[im 54/60]
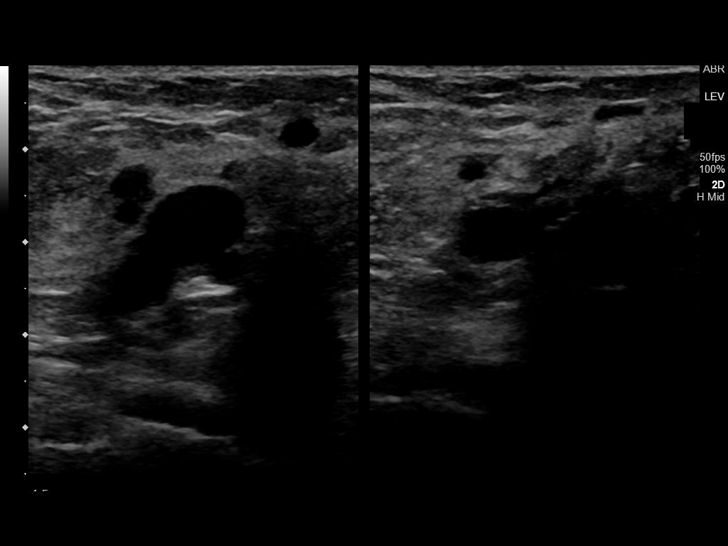
[im 60/60]
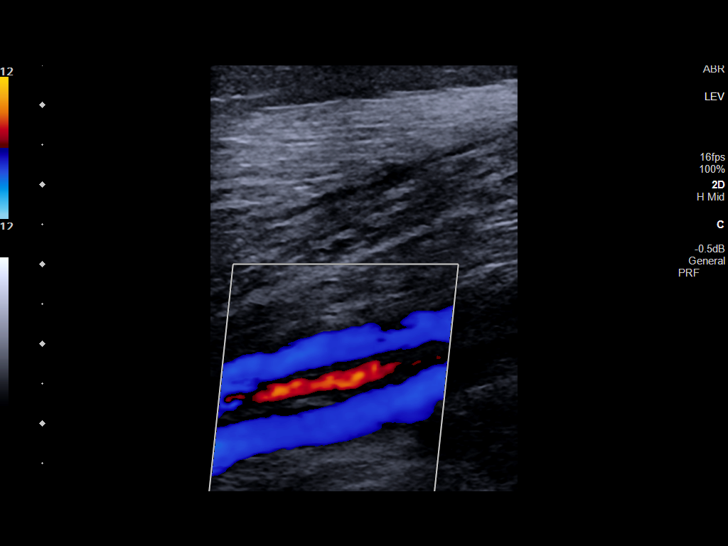

[13 of 24 positions shown; findings below may reference images not displayed]

FINDINGS: RIGHT LOWER EXTREMITY

Common Femoral Vein: No evidence of thrombus. Normal
compressibility, respiratory phasicity and response to augmentation.

Saphenofemoral Junction: No evidence of thrombus. Normal
compressibility and flow on color Doppler imaging.

Profunda Femoral Vein: No evidence of thrombus. Normal
compressibility and flow on color Doppler imaging.

Femoral Vein: No evidence of thrombus. Normal compressibility,
respiratory phasicity and response to augmentation.

Popliteal Vein: No evidence of thrombus. Normal compressibility,
respiratory phasicity and response to augmentation.

Calf Veins: No evidence of thrombus. Normal compressibility and flow
on color Doppler imaging.

Superficial Great Saphenous Vein: No evidence of thrombus. Normal
compressibility and flow on color Doppler imaging.

Other Findings:  None.

LEFT LOWER EXTREMITY

Common Femoral Vein: No evidence of thrombus. Normal
compressibility, respiratory phasicity and response to augmentation.

Saphenofemoral Junction: No evidence of thrombus. Normal
compressibility and flow on color Doppler imaging.

Profunda Femoral Vein: No evidence of thrombus. Normal
compressibility and flow on color Doppler imaging.

Femoral Vein: No evidence of thrombus. Normal compressibility,
respiratory phasicity and response to augmentation.

Popliteal Vein: No evidence of thrombus. Normal compressibility,
respiratory phasicity and response to augmentation.

Calf Veins: No evidence of thrombus. Normal compressibility and flow
on color Doppler imaging.

Superficial Great Saphenous Vein: No evidence of thrombus. Normal
compressibility and flow on color Doppler imaging.

Other Findings:  None.
IMPRESSION: Sonographic survey of the bilateral lower extremities negative for
DVT

## 2020-07-19 ENCOUNTER — Ambulatory Visit
Admission: RE | Admit: 2020-07-19 | Discharge: 2020-07-19 | Disposition: A | Discharge status: Home / Self Care | Payer: Medicaid Other | Source: Ambulatory Visit | Attending: Oncology | Admitting: Oncology

## 2020-07-19 ENCOUNTER — Other Ambulatory Visit: Payer: Self-pay | Admitting: Anatomic Pathology & Clinical Pathology

## 2020-07-19 ENCOUNTER — Encounter: Payer: Self-pay | Admitting: Oncology

## 2020-07-19 ENCOUNTER — Other Ambulatory Visit: Payer: Self-pay

## 2020-07-19 DIAGNOSIS — C50911 Malignant neoplasm of unspecified site of right female breast: Secondary | ICD-10-CM | POA: Diagnosis present

## 2020-07-19 DIAGNOSIS — C50919 Malignant neoplasm of unspecified site of unspecified female breast: Secondary | ICD-10-CM

## 2020-07-19 LAB — GLUCOSE, CAPILLARY: Glucose-Capillary: 105 mg/dL — ABNORMAL HIGH (ref 70–99)

## 2020-07-19 IMAGING — PT NM PET TUM IMG INITIAL (PI) SKULL BASE T - THIGH
1 of 9 series · 1 of 25 positions shown · non-contrast
Comparison: MR total spine [DATE], CT chest [DATE].

CLINICAL DATA: Initial treatment strategy for breast cancer.

EXAM:
NUCLEAR MEDICINE PET SKULL BASE TO THIGH
TECHNIQUE: 9.9 mCi F-18 FDG was injected intravenously. Full-ring PET imaging
was performed from the skull base to thigh after the radiotracer. CT
data was obtained and used for attenuation correction and anatomic
localization.
Fasting blood glucose: 105 mg/dl

[Series 3: ct wb 5.0 b30f · axial · 5.0mm · 0.98mm/px · 1 of 329 slices shown]
[im 329/329  brain]
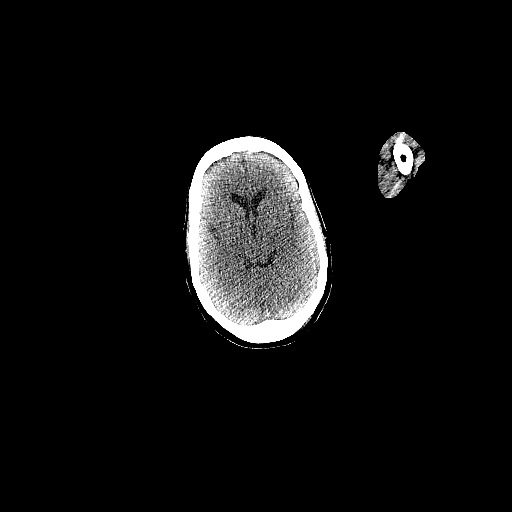

[1 of 25 positions shown; findings below may reference images not displayed]

FINDINGS: Mediastinal blood pool activity: SUV max

Liver activity: SUV max NA

NECK:

No abnormal hypermetabolism.

Incidental CT findings:

None.

CHEST:

There are hypermetabolic bilateral mediastinal and hilar lymph
nodes. Index high right paratracheal lymph node measures 11 mm
(3/75) with an SUV max of 3.4. Hypermetabolic bilateral axillary
lymph nodes, right greater than left. Index right axillary lymph
node measures 11 mm (3/92) with an SUV max of 7.5. Hypermetabolic
prepericardiac adenopathy measures up to 10 mm an SUV max of 4.3.

Hypermetabolic thickening of the right hemidiaphragm is seen along
the ventral margin of the liver (3/133), with an SUV max of 4.1.

Hypermetabolic right breast mass measures approximately 4.2 x 4.9 cm
with an SUV max up to 13.1. Additional hypermetabolic subcutaneous
nodularity in the upper right ventral chest wall with overlying skin
thickening.

Focal hypermetabolism posterior to the mid descending thoracic aorta
corresponds to 1.8 cm pleural nodule, with an SUV max of 6.0.

Incidental CT findings:

Left-sided Port-A-Cath terminates in the right atrium. Heart is
enlarged. No pericardial effusion. Large partially loculated right
pleural effusion. Tiny left pleural effusion. Nodularity is seen
along the left major fissure. Nodules are too small for PET
resolution.

ABDOMEN/PELVIS:

No definitive hypermetabolism within the liver, adrenal glands or
spleen. Focal hypermetabolism in the pancreatic tail, SUV 3.5,
corresponding to an 11 mm low-attenuation lesion on CT ([DATE]). No
hypermetabolic lymph nodes.

Incidental CT findings:

Liver, gallbladder, adrenal glands, kidneys, spleen, pancreas,
stomach and bowel are otherwise unremarkable. Small pelvic free
fluid.

SKELETON:

Hypermetabolic lesions are seen throughout the visualized osseous
structures. Index lesion in the right sacrum has an SUV max of 6.2.

Incidental CT findings:

Predominately sclerotic lesions are seen throughout the visualized
osseous structures.
IMPRESSION: 1. Metastatic breast cancer involving the right breast/chest wall,
bilateral mediastinal, hilar and axillary lymph nodes, left
hemithorax and bones.
2. Hypermetabolic lesion in the pancreatic tail may represent a
metastasis or primary lesion.

## 2020-07-19 MED ORDER — FLUDEOXYGLUCOSE F - 18 (FDG) INJECTION
9.7000 | Freq: Once | INTRAVENOUS | Status: AC | PRN
  Administered 2020-07-19: 9.897 via INTRAVENOUS

## 2020-07-20 NOTE — Telephone Encounter (Signed)
Results scanned in media.

## 2020-07-21 ENCOUNTER — Other Ambulatory Visit: Payer: Self-pay | Admitting: Oncology

## 2020-07-21 DIAGNOSIS — K869 Disease of pancreas, unspecified: Secondary | ICD-10-CM

## 2020-07-21 DIAGNOSIS — C50919 Malignant neoplasm of unspecified site of unspecified female breast: Secondary | ICD-10-CM

## 2020-07-22 ENCOUNTER — Inpatient Hospital Stay: Payer: Medicaid Other

## 2020-07-22 ENCOUNTER — Other Ambulatory Visit: Payer: Self-pay

## 2020-07-22 ENCOUNTER — Encounter: Payer: Self-pay | Admitting: Oncology

## 2020-07-22 ENCOUNTER — Inpatient Hospital Stay (HOSPITAL_BASED_OUTPATIENT_CLINIC_OR_DEPARTMENT_OTHER): Payer: Medicaid Other | Admitting: Oncology

## 2020-07-22 VITALS — BP 130/78 | HR 114 | Temp 98.5°F | Resp 18 | Wt 190.0 lb

## 2020-07-22 DIAGNOSIS — C50919 Malignant neoplasm of unspecified site of unspecified female breast: Secondary | ICD-10-CM | POA: Diagnosis not present

## 2020-07-22 DIAGNOSIS — K869 Disease of pancreas, unspecified: Secondary | ICD-10-CM | POA: Insufficient documentation

## 2020-07-22 DIAGNOSIS — Z803 Family history of malignant neoplasm of breast: Secondary | ICD-10-CM

## 2020-07-22 DIAGNOSIS — D509 Iron deficiency anemia, unspecified: Secondary | ICD-10-CM

## 2020-07-22 DIAGNOSIS — C7951 Secondary malignant neoplasm of bone: Secondary | ICD-10-CM | POA: Diagnosis not present

## 2020-07-22 DIAGNOSIS — Z5111 Encounter for antineoplastic chemotherapy: Secondary | ICD-10-CM

## 2020-07-22 DIAGNOSIS — E559 Vitamin D deficiency, unspecified: Secondary | ICD-10-CM

## 2020-07-22 LAB — COMPREHENSIVE METABOLIC PANEL
ALT: 14 U/L (ref 0–44)
AST: 26 U/L (ref 15–41)
Albumin: 3.5 g/dL (ref 3.5–5.0)
Alkaline Phosphatase: 77 U/L (ref 38–126)
Anion gap: 8 (ref 5–15)
BUN: 12 mg/dL (ref 6–20)
CO2: 26 mmol/L (ref 22–32)
Calcium: 8.8 mg/dL — ABNORMAL LOW (ref 8.9–10.3)
Chloride: 104 mmol/L (ref 98–111)
Creatinine, Ser: 0.38 mg/dL — ABNORMAL LOW (ref 0.44–1.00)
GFR, Estimated: >60 mL/min (ref >60)
Glucose, Bld: 117 mg/dL — ABNORMAL HIGH (ref 70–99)
Potassium: 3.8 mmol/L (ref 3.5–5.1)
Sodium: 138 mmol/L (ref 135–145)
Total Bilirubin: 0.5 mg/dL (ref 0.3–1.2)
Total Protein: 6.7 g/dL (ref 6.5–8.1)

## 2020-07-22 LAB — CBC WITH DIFFERENTIAL/PLATELET
Abs Immature Granulocytes: 0.06 10*3/uL (ref 0.00–0.07)
Basophils Absolute: 0 10*3/uL (ref 0.0–0.1)
Basophils Relative: 1 %
Eosinophils Absolute: 0 10*3/uL (ref 0.0–0.5)
Eosinophils Relative: 1 %
HCT: 32.7 % — ABNORMAL LOW (ref 36.0–46.0)
Hemoglobin: 10.2 g/dL — ABNORMAL LOW (ref 12.0–15.0)
Immature Granulocytes: 1 %
Lymphocytes Relative: 21 %
Lymphs Abs: 1.3 10*3/uL (ref 0.7–4.0)
MCH: 26.1 pg (ref 26.0–34.0)
MCHC: 31.2 g/dL (ref 30.0–36.0)
MCV: 83.6 fL (ref 80.0–100.0)
Monocytes Absolute: 0.5 10*3/uL (ref 0.1–1.0)
Monocytes Relative: 8 %
Neutro Abs: 4.2 10*3/uL (ref 1.7–7.7)
Neutrophils Relative %: 68 %
Platelets: 347 10*3/uL (ref 150–400)
RBC: 3.91 MIL/uL (ref 3.87–5.11)
RDW: 16.7 % — ABNORMAL HIGH (ref 11.5–15.5)
WBC: 6.1 10*3/uL (ref 4.0–10.5)
nRBC: 0 % (ref 0.0–0.2)

## 2020-07-22 MED ORDER — HEPARIN SOD (PORK) LOCK FLUSH 100 UNIT/ML IV SOLN
500.0000 [IU] | Freq: Once | INTRAVENOUS | Status: DC | PRN
  Filled 2020-07-22: qty 5

## 2020-07-22 MED ORDER — SODIUM CHLORIDE 0.9 % IV SOLN
80.0000 mg/m2 | Freq: Once | INTRAVENOUS | Status: AC
  Administered 2020-07-22: 162 mg via INTRAVENOUS
  Filled 2020-07-22: qty 27

## 2020-07-22 MED ORDER — SODIUM CHLORIDE 0.9 % IV SOLN
Freq: Once | INTRAVENOUS | Status: AC
  Filled 2020-07-22: qty 250

## 2020-07-22 MED ORDER — SODIUM CHLORIDE 0.9 % IV SOLN
10.0000 mg | Freq: Once | INTRAVENOUS | Status: AC
  Administered 2020-07-22: 10 mg via INTRAVENOUS
  Filled 2020-07-22: qty 10

## 2020-07-22 MED ORDER — HEPARIN SOD (PORK) LOCK FLUSH 100 UNIT/ML IV SOLN
500.0000 [IU] | Freq: Once | INTRAVENOUS | Status: AC
  Administered 2020-07-22: 500 [IU] via INTRAVENOUS
  Filled 2020-07-22: qty 5

## 2020-07-22 MED ORDER — FAMOTIDINE IN NACL 20-0.9 MG/50ML-% IV SOLN
20.0000 mg | Freq: Once | INTRAVENOUS | Status: AC
  Administered 2020-07-22: 20 mg via INTRAVENOUS
  Filled 2020-07-22: qty 50

## 2020-07-22 MED ORDER — SODIUM CHLORIDE 0.9% FLUSH
10.0000 mL | Freq: Once | INTRAVENOUS | Status: AC
  Administered 2020-07-22: 10 mL via INTRAVENOUS
  Filled 2020-07-22: qty 10

## 2020-07-22 MED ORDER — DIPHENHYDRAMINE HCL 50 MG/ML IJ SOLN
50.0000 mg | Freq: Once | INTRAMUSCULAR | Status: AC
Start: 2020-07-22 — End: 2020-07-22
  Administered 2020-07-22: 50 mg via INTRAVENOUS
  Filled 2020-07-22: qty 1

## 2020-07-22 NOTE — Progress Notes (Signed)
HR 114 ok per md

## 2020-07-22 NOTE — Progress Notes (Signed)
Stable at discharge 

## 2020-07-22 NOTE — Progress Notes (Signed)
Hematology/Oncology Follow Up Note [facility]  Telephone:([phone] [phone]  Patient Care Team: [place] as PCP - General Rico Junker, RN as Registered Nurse   Name of the patient: Jessica Willis  [id number]  01-26-1966   REASON FOR VISIT  follow-up for triple negative breast cancer  INTERVAL HISTORY 54 y.o. with past medical history including anemia, family history of breast cancer presents for follow-up of management of metastatic triple negative breast cancer.  06/15/2020-07/01/2020 Patient was admitted due to shortness of breath.  Also reported to have right breast cancer which she has noticed for about at least 6 months.  Initial x-ray and a CT showed massive right-sided pleural effusion , mass-effect and shift to the midline, near complete complex of the right lung, right breast mass extending to the skin surface associated with skin thickening and right axillary lymphadenopathy. Patient had initially pleural catheter placement later chest tube placement. Patient was admitted to ICU due to respiratory failure.  She was also seen by Dr. Genevive Bi initially and there was an attempt for tac pleurodesis which was not successful.  Chest tube was removed. Patient's respiratory status improved. Initial pleural effusion cytology came back positive for adenocarcinoma compatible with breast origin.  Triple negative due to lack of tissue for additional testing.  Patient admitted breast mass biopsy as well as Mediport placement by Dr. Lysle Pearl.  Patient was given first dose of Taxol 80 mg/m during her admission and she tolerated well. Patient was discharged on 07/01/2020.  Respiratory status improved and she was discharged home without home oxygen.  # MRI brain showed no intracranial brain metastasis.  1 cm metastasis affecting the clivus.  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  patient was accompanied by her son.  She  reports feeling well since discharge.  No shortness of breath.  Patient denies any pain.  She takes Tylenol if needed for discomforts at the right thoracentesis and breast biopsy sites.  She denies any headache, neck or back pain. Her appetite is fair.  INTERVAL HISTORY [name] is a 54 y.o. female who has above history reviewed by me today presents for follow up visit for management of triple negative breast cancer Problems and complaints are listed below: Patient is status post 3 weekly doses of Taxol.   NGS showed AXIN1 G4255, LAMP1 gain, LRP1B E4499, MYC gain, NOTCH-BRD4 fusion. MS Stable, CPS <1 Bilateral ankle edema, improved after leg elevation.  Bilateral lower extremity ultrasound is negative for DVT. Patient reports feeling well today.  No nausea vomiting diarrhea. Denies any numbness or tingling of fingertips. She denies any new concerns or complaints of her right breast mass.  Review of Systems  Constitutional: Positive for fatigue. Negative for appetite change, chills and fever.  HENT:   Negative for hearing loss and voice change.   Eyes: Negative for eye problems.  Respiratory: Negative for chest tightness, cough and shortness of breath.   Cardiovascular: Positive for leg swelling. Negative for chest pain.  Gastrointestinal: Negative for abdominal distention, abdominal pain and blood in stool.  Endocrine: Negative for hot flashes.  Genitourinary: Negative for difficulty urinating and frequency.   Musculoskeletal: Negative for arthralgias.  Skin: Negative for itching and rash.  Neurological: Negative for extremity weakness.  Hematological: Negative for adenopathy.  Psychiatric/Behavioral: Negative for confusion.      No Known Allergies   Past Medical History:  Diagnosis Date  . Anemia   . Family history of breast cancer   .  Patient denies medical problems      Past Surgical History:  Procedure Laterality Date  . BREAST BIOPSY  06/28/2020    Procedure: BREAST BIOPSY;  Surgeon: Benjamine Sprague, DO;  Location: [facility] ORS;  Service: General;;  . PORTACATH PLACEMENT N/A 06/28/2020   Procedure: INSERTION PORT-A-CATH;  Surgeon: Benjamine Sprague, DO;  Location: [facility] ORS;  Service: General;  Laterality: N/A;  . TUBAL LIGATION    . VIDEO ASSISTED THORACOSCOPY (VATS)/THOROCOTOMY Right 06/23/2020   Procedure: ATTEMPTED VIDEO ASSISTED THORACOSCOPY (VATS);  Surgeon: Nestor Lewandowsky, MD;  Location: [facility] ORS;  Service: General;  Laterality: Right;    Social History   Socioeconomic History  . Marital status: Single    Spouse name: Not on file  . Number of children: Not on file  . Years of education: Not on file  . Highest education level: Not on file  Occupational History  . Not on file  Tobacco Use  . Smoking status: Never Smoker  . Smokeless tobacco: Never Used  Substance and Sexual Activity  . Alcohol use: Not Currently    Alcohol/week: 1.0 standard drink    Types: 1 Cans of beer per week  . Drug use: No  . Sexual activity: Not on file  Other Topics Concern  . Not on file  Social History Narrative  . Not on file   Social Determinants of Health   Financial Resource Strain: Not on file  Food Insecurity: Not on file  Transportation Needs: Not on file  Physical Activity: Not on file  Stress: Not on file  Social Connections: Not on file  Intimate Partner Violence: Not on file    Family History  Problem Relation Age of Onset  . Diabetes Other   . Hypertension Other   . Diabetes Maternal Aunt   . Breast cancer Cousin        dx 17s  . Breast cancer Cousin        dx 45s     Current Outpatient Medications:  .  calcium carbonate (CALCIUM 600) 600 MG TABS tablet, Take 2 tablets (1,200 mg total) by mouth daily with breakfast., Disp: 60 tablet, Rfl: 1 .  ergocalciferol (VITAMIN D2) 1.25 MG (50000 UT) capsule, Take 1 capsule (50,000 Units total) by mouth once a week for 6 doses., Disp: 6 capsule, Rfl: 0 .  ferrous sulfate 325 (65 FE)  MG EC tablet, Take 1 tablet (325 mg total) by mouth 2 (two) times daily with a meal., Disp: 60 tablet, Rfl: 1 .  folic acid (FOLVITE) 1 MG tablet, Take 1 tablet (1 mg total) by mouth daily., Disp: 60 tablet, Rfl: 0 .  lidocaine-prilocaine (EMLA) cream, Apply 1 application topically as needed. Place a small amount of cream to port site prior to each chemo treatment, 1-2 hours before treatment., Disp: 30 g, Rfl: 0 .  pantoprazole (PROTONIX) 40 MG tablet, Take 1 tablet (40 mg total) by mouth daily., Disp: 30 tablet, Rfl: 0 .  Vitamin D3 (VITAMIN D) 25 MCG tablet, Take 1 tablet (1,000 Units total) by mouth daily. (Patient not taking: Reported on 07/22/2020), Disp: 30 tablet, Rfl: 0 No current facility-administered medications for this visit.  Facility-Administered Medications Ordered in Other Visits:  .  dexamethasone (DECADRON) 10 mg in sodium chloride 0.9 % 50 mL IVPB, 10 mg, Intravenous, Once, Earlie Server, MD, Last Rate: 204 mL/hr at 07/22/20 1037, 10 mg at 07/22/20 1037 .  famotidine (PEPCID) IVPB 20 mg premix, 20 mg, Intravenous, Once, Earlie Server, MD .  heparin lock flush 100 unit/mL, 500 Units, Intravenous, Once, Earlie Server, MD .  heparin lock flush 100 unit/mL, 500 Units, Intracatheter, Once PRN, Earlie Server, MD .  PACLitaxel (TAXOL) 162 mg in sodium chloride 0.9 % 250 mL chemo infusion (</= 80mg /m2), 80 mg/m2 (Order-Specific), Intravenous, Once, Earlie Server, MD  Physical exam:  Vitals:   07/22/20 0913 07/22/20 0939  BP: (!) 165/102 130/78  Pulse: (!) 131 (!) 114  Resp: 18   Temp: 98.5 F (36.9 C)   TempSrc: Tympanic   SpO2: 96%   Weight: 190 lb (86.2 kg)    Physical Exam Constitutional:      General: She is not in acute distress.    Comments: Thin built  HENT:     Head: Normocephalic and atraumatic.  Eyes:     General: No scleral icterus. Cardiovascular:     Rate and Rhythm: Normal rate and regular rhythm.     Heart sounds: Normal heart sounds.  Pulmonary:     Effort: Pulmonary effort is  normal. No respiratory distress.     Breath sounds: No wheezing.  Abdominal:     General: Bowel sounds are normal. There is no distension.     Palpations: Abdomen is soft.  Musculoskeletal:        General: No deformity. Normal range of motion.     Cervical back: Normal range of motion and neck supple.     Comments: Bilateral ankle edema  Skin:    General: Skin is warm and dry.     Findings: No erythema or rash.     Comments: + Left anterior chest wall Mediport.  Neurological:     Mental Status: She is alert and oriented to person, place, and time. Mental status is at baseline.     Cranial Nerves: No cranial nerve deficit.     Coordination: Coordination normal.  Psychiatric:        Mood and Affect: Mood normal.   Large right breast mass with skin involvement.  CMP Latest Ref Rng & Units 07/22/2020  Glucose 70 - 99 mg/dL 117(H)  BUN 6 - 20 mg/dL 12  Creatinine 0.44 - 1.00 mg/dL 0.38(L)  Sodium 135 - 145 mmol/L 138  Potassium 3.5 - 5.1 mmol/L 3.8  Chloride 98 - 111 mmol/L 104  CO2 22 - 32 mmol/L 26  Calcium 8.9 - 10.3 mg/dL 8.8(L)  Total Protein 6.5 - 8.1 g/dL 6.7  Total Bilirubin 0.3 - 1.2 mg/dL 0.5  Alkaline Phos 38 - 126 U/L 77  AST 15 - 41 U/L 26  ALT 0 - 44 U/L 14   CBC Latest Ref Rng & Units 07/22/2020  WBC 4.0 - 10.5 K/uL 6.1  Hemoglobin 12.0 - 15.0 g/dL 10.2(L)  Hematocrit 36.0 - 46.0 % 32.7(L)  Platelets 150 - 400 K/uL 347    RADIOGRAPHIC STUDIES: I have personally reviewed the radiological images as listed and agreed with the findings in the report. DG Chest 1 View  Result Date: 06/28/2020 CLINICAL DATA:  Status port MediPort catheter placement EXAM: CHEST  1 VIEW COMPARISON:  June 23, 2020 FINDINGS: The heart size and mediastinal contours are unchanged with mild cardiomegaly. There is prominence of the central pulmonary vasculature. Again noted is patchy airspace opacity throughout the right lung with a probable loculated moderate pleural effusion  unchanged from prior exam. Left perihilar hazy airspace opacity is noted. A left-sided MediPort catheter seen with the tip at the right atrium. No acute osseous abnormality. IMPRESSION: Left MediPort catheter tip just  at the right atrium. No other change in the bilateral perihilar and patchy airspace opacities Loculated right pleural effusion Electronically Signed   By: Prudencio Pair M.D.   On: 06/28/2020 15:31   MR BRAIN W WO CONTRAST  Result Date: 06/24/2020 CLINICAL DATA:  History of metastatic breast cancer.  Staging. EXAM: MRI HEAD WITHOUT AND WITH CONTRAST TECHNIQUE: Multiplanar, multiecho pulse sequences of the brain and surrounding structures were obtained without and with intravenous contrast. CONTRAST:  85mL GADAVIST GADOBUTROL 1 MMOL/ML IV SOLN COMPARISON:  None. FINDINGS: Brain: The brain has a normal appearance without evidence of malformation, atrophy, old or acute small or large vessel infarction, mass lesion, hemorrhage, hydrocephalus or extra-axial collection. After contrast administration, no enhancement the brain or leptomeninges occurs. Vascular: Major vessels at the base of the brain show flow. Venous sinuses appear patent. Skull and upper cervical spine: 1 cm metastasis affecting the clivus. Scattered metastases in the upper cervical spine. Sinuses/Orbits: Clear/normal. Other: None significant. IMPRESSION: 1. Normal appearance of the brain itself. No evidence of metastatic disease to the brain or leptomeninges. 2. 1 cm metastasis affecting the clivus. Scattered metastases in the upper cervical spine. Electronically Signed   By: Nelson Chimes M.D.   On: 06/24/2020 11:53   NM PET Image Initial (PI) Skull Base To Thigh  Result Date: 07/20/2020 CLINICAL DATA:  Initial treatment strategy for breast cancer. EXAM: NUCLEAR MEDICINE PET SKULL BASE TO THIGH TECHNIQUE: 9.9 mCi F-18 FDG was injected intravenously. Full-ring PET imaging was performed from the skull base to thigh after the radiotracer.  CT data was obtained and used for attenuation correction and anatomic localization. Fasting blood glucose: 105 mg/dl COMPARISON:  MR total spine 06/25/2020, CT chest 06/15/2020. FINDINGS: Mediastinal blood pool activity: SUV max 2.1 Liver activity: SUV max NA NECK: No abnormal hypermetabolism. Incidental CT findings: None. CHEST: There are hypermetabolic bilateral mediastinal and hilar lymph nodes. Index high right paratracheal lymph node measures 11 mm (3/75) with an SUV max of 3.4. Hypermetabolic bilateral axillary lymph nodes, right greater than left. Index right axillary lymph node measures 11 mm (3/92) with an SUV max of 7.5. Hypermetabolic prepericardiac adenopathy measures up to 10 mm an SUV max of 4.3. Hypermetabolic thickening of the right hemidiaphragm is seen along the ventral margin of the liver (3/133), with an SUV max of 4.1. Hypermetabolic right breast mass measures approximately 4.2 x 4.9 cm with an SUV max up to 13.1. Additional hypermetabolic subcutaneous nodularity in the upper right ventral chest wall with overlying skin thickening. Focal hypermetabolism posterior to the mid descending thoracic aorta corresponds to 1.8 cm pleural nodule, with an SUV max of 6.0. Incidental CT findings: Left-sided Port-A-Cath terminates in the right atrium. Heart is enlarged. No pericardial effusion. Large partially loculated right pleural effusion. Tiny left pleural effusion. Nodularity is seen along the left major fissure. Nodules are too small for PET resolution. ABDOMEN/PELVIS: No definitive hypermetabolism within the liver, adrenal glands or spleen. Focal hypermetabolism in the pancreatic tail, SUV 3.5, corresponding to an 11 mm low-attenuation lesion on CT (3/145). No hypermetabolic lymph nodes. Incidental CT findings: Liver, gallbladder, adrenal glands, kidneys, spleen, pancreas, stomach and bowel are otherwise unremarkable. Small pelvic free fluid. SKELETON: Hypermetabolic lesions are seen throughout the  visualized osseous structures. Index lesion in the right sacrum has an SUV max of 6.2. Incidental CT findings: Predominately sclerotic lesions are seen throughout the visualized osseous structures. IMPRESSION: 1. Metastatic breast cancer involving the right breast/chest wall, bilateral mediastinal, hilar and axillary lymph nodes,  left hemithorax and bones. 2. Hypermetabolic lesion in the pancreatic tail may represent a metastasis or primary lesion. Electronically Signed   By: Lorin Picket M.D.   On: 07/20/2020 09:33   US Venous Img Lower Bilateral  Result Date: 07/15/2020 CLINICAL DATA:  54 year old female with a history of bilateral lower extremity swelling EXAM: BILATERAL LOWER EXTREMITY VENOUS DOPPLER ULTRASOUND TECHNIQUE: Gray-scale sonography with graded compression, as well as color Doppler and duplex ultrasound were performed to evaluate the lower extremity deep venous systems from the level of the common femoral vein and including the common femoral, femoral, profunda femoral, popliteal and calf veins including the posterior tibial, peroneal and gastrocnemius veins when visible. The superficial great saphenous vein was also interrogated. Spectral Doppler was utilized to evaluate flow at rest and with distal augmentation maneuvers in the common femoral, femoral and popliteal veins. COMPARISON:  None. FINDINGS: RIGHT LOWER EXTREMITY Common Femoral Vein: No evidence of thrombus. Normal compressibility, respiratory phasicity and response to augmentation. Saphenofemoral Junction: No evidence of thrombus. Normal compressibility and flow on color Doppler imaging. Profunda Femoral Vein: No evidence of thrombus. Normal compressibility and flow on color Doppler imaging. Femoral Vein: No evidence of thrombus. Normal compressibility, respiratory phasicity and response to augmentation. Popliteal Vein: No evidence of thrombus. Normal compressibility, respiratory phasicity and response to augmentation. Calf Veins:  No evidence of thrombus. Normal compressibility and flow on color Doppler imaging. Superficial Great Saphenous Vein: No evidence of thrombus. Normal compressibility and flow on color Doppler imaging. Other Findings:  None. LEFT LOWER EXTREMITY Common Femoral Vein: No evidence of thrombus. Normal compressibility, respiratory phasicity and response to augmentation. Saphenofemoral Junction: No evidence of thrombus. Normal compressibility and flow on color Doppler imaging. Profunda Femoral Vein: No evidence of thrombus. Normal compressibility and flow on color Doppler imaging. Femoral Vein: No evidence of thrombus. Normal compressibility, respiratory phasicity and response to augmentation. Popliteal Vein: No evidence of thrombus. Normal compressibility, respiratory phasicity and response to augmentation. Calf Veins: No evidence of thrombus. Normal compressibility and flow on color Doppler imaging. Superficial Great Saphenous Vein: No evidence of thrombus. Normal compressibility and flow on color Doppler imaging. Other Findings:  None. IMPRESSION: Sonographic survey of the bilateral lower extremities negative for DVT Signed, Dulcy Fanny. Dellia Nims, RPVI [facility] Radiology Electronically Signed   By: Corrie Mckusick D.O.   On: 07/15/2020 14:30   DG Chest Port 1 View  Result Date: 06/23/2020 CLINICAL DATA:  Status post video-assisted thoracoscopy. EXAM: PORTABLE CHEST 1 VIEW COMPARISON:  June 22, 2020. FINDINGS: Stable cardiomegaly central pulmonary vascular congestion is noted. Right-sided chest tube has been removed. No pneumothorax is noted. Stable diffuse right lung opacity is noted concerning for pneumonia with associated pleural effusion, including loculated fluid and right lung apex. Small loculated left pleural effusion is noted laterally. Left perihilar and basilar opacity is noted concerning for infiltrate or possibly edema. Bony thorax is unremarkable.  IMPRESSION: Stable diffuse right lung opacity is noted concerning for pneumonia with associated pleural effusion, including loculated fluid and right lung apex. Small loculated left pleural effusion is noted laterally. Left perihilar and basilar opacity is noted concerning for infiltrate or possibly edema. Electronically Signed   By: Marijo Conception M.D.   On: 06/23/2020 14:59   DG Chest Port 1 View  Result Date: 06/22/2020 CLINICAL DATA:  Preoperative chest evaluation EXAM: PORTABLE CHEST 1 VIEW COMPARISON:  06/19/2020 FINDINGS: Chest tube remains in place on the right. Somewhat less pleural density on the  right. There may be a small amount of air in the pleural space. Persistent diffuse infiltrate in the right lung. Patchy infiltrate in the mid and lower lung on the left is somewhat improved. IMPRESSION: 1. Chest tube remains in place on the right. Somewhat less pleural fluid. Question small amount of air in the pleural space. 2. Persistent diffuse infiltrate on the right. 3. Improved aeration in the left mid and lower lung. Electronically Signed   By: Nelson Chimes M.D.   On: 06/22/2020 14:43   DG C-Arm 1-60 Min-No Report  Result Date: 06/28/2020 Fluoroscopy was utilized by the requesting physician.  No radiographic interpretation.   MR TOTAL SPINE METS SCREENING  Result Date: 06/25/2020 CLINICAL DATA:  Metastatic breast cancer. EXAM: MRI TOTAL SPINE WITHOUT AND WITH CONTRAST TECHNIQUE: Multisequence MR imaging of the spine from the cervical spine to the sacrum was performed prior to and following IV contrast administration for evaluation of spinal metastatic disease. Large field of view sagittal imaging was performed. No axial imaging was obtained. CONTRAST:  56mL GADAVIST GADOBUTROL 1 MMOL/ML IV SOLN COMPARISON:  Head MRI 06/24/2020. Chest CTA 06/15/2020. CT abdomen and pelvis 06/25/2015. FINDINGS: MRI CERVICAL SPINE FINDINGS Alignment: Cervical spine straightening.  No significant listhesis.  Vertebrae: Heterogeneous bone marrow signal throughout the cervical spine with patchy areas of enhancement consistent with osseous metastatic disease. No evidence of epidural tumor. Cord: Normal signal.  No abnormal intradural enhancement. Posterior Fossa, vertebral arteries, paraspinal tissues: Posterior fossa more fully evaluated on the recent head MRI. No gross paraspinal mass on this study which does not include axial images. Disc levels: Moderate to severe disc space narrowing from C4-5 to C7-T1 with degenerative endplate changes, disc bulging, and spurring. Assessment for degenerative stenosis is limited in the absence of axial imaging. No compressive spinal stenosis. MRI THORACIC SPINE FINDINGS Alignment: Slight thoracic levoscoliosis. No significant listhesis. Vertebrae: Heterogeneous bone marrow signal throughout the thoracic spine with patchy areas of enhancement consistent with osseous metastatic disease. No evidence of epidural tumor. Cord:  Normal signal.  No abnormal intradural enhancement. Paraspinal and other soft tissues: Partially visualized known large right pleural effusion as well as a small left pleural effusion. Partially visualized known right breast mass. Disc levels: Mild-to-moderate diffuse thoracic disc degeneration with disc bulging. No compressive stenosis. MRI LUMBAR SPINE FINDINGS Segmentation:  Standard. Alignment:  Trace retrolisthesis of L2 on L3. Vertebrae: Heterogeneous bone marrow signal throughout the lumbar spine and sacrum with patchy areas of enhancement consistent with osseous metastatic disease. No evidence of epidural tumor. Conus medullaris: Extends to the L1 level and appears normal. Paraspinal and other soft tissues: No gross paraspinal mass on this study which does not include axial images. Disc levels: Focally advanced disc space narrowing at L2-3 with degenerative endplate changes. Mild disc bulging at L2-3, L3-4, and L4-5 without evidence of compressive stenosis.  IMPRESSION: 1. Widespread osseous metastatic disease. No evidence of epidural tumor. 2. Partial coverage of known right breast mass and large right and small left pleural effusions. Electronically Signed   By: Logan Bores M.D.   On: 06/25/2020 15:12     Assessment and plan Patient is a 54 y.o. female presents for follow-up of metastatic triple negative breast cancer 1. Metastatic breast cancer ([place])   2. Encounter for antineoplastic chemotherapy   3. Pancreatic lesion   4. Bone metastasis ([place])   5. Iron deficiency anemia, unspecified iron deficiency anemia type   6. Vitamin D deficiency   7. Family history of breast cancer    #  Stage IV metastatic triple negative breast cancer.  On palliative chemotherapy. PET scan was independent reviewed by me and discussed with patient. Consistent with metastatic breast cancer with extensive metastasis.  Pancreatic lesion, possible inflammatory versus metastasis versus primary malignancy.  Check CA 19 9. Labs are reviewed and discussed with patient.  Proceed with Taxol 80 mg/m today. Overall she tolerates well.  Plan to continue 5 weekly treatments upfront, then will switch to 3 weeks on 1 week off regimen.  #Tachycardia, this is has been a chronic issue for her.  Today's heart rate is 114. 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  PET scan showed persistent large loculated right pleural effusion.  #Bone metastasis, status post 1 dose of Zometa on 07/06/2020, recommend monthly Zometa.. Recommend patient to take calcium and vitamin D supplementation.  #Vitamin D deficiency, continue vitamin D 50,000 weekly x6.. # Folate deficiency, continue folic acid.  # Iron deficiency anemia s/p IV venofer x 2 Recommend patient to start oral iron supplementation ferrous sulfate 325 mg twice daily.  Anemia has been stable. #Leg swelling, no DVT.  Recommend leg elevation.  #Neoplasm related pain, patient denies any pain at this  point.Marland Kitchen #GERD, continue Protonix 40 mg daily. #Family history of cancer, genetic testing is pending. #She has been referred to establish care with palliative care service.  She understands that her condition is not curable.  Treatment is with palliative intent.  follow up in 1 week   Earlie Server, MD, PhD Hematology Oncology [facility] Pager- [phone] 07/22/2020

## 2020-07-22 NOTE — Progress Notes (Signed)
Patient here for follow up. No new concerns voiced.  °

## 2020-07-23 LAB — CANCER ANTIGEN 15-3: CA 15-3: 210 U/mL — ABNORMAL HIGH (ref 0.0–25.0)

## 2020-07-23 LAB — CANCER ANTIGEN 27.29: CA 27.29: 837.5 U/mL — ABNORMAL HIGH (ref 0.0–38.6)

## 2020-07-23 LAB — CANCER ANTIGEN 19-9: CA 19-9: 9 U/mL (ref 0–35)

## 2020-07-24 ENCOUNTER — Other Ambulatory Visit: Payer: Self-pay | Admitting: Oncology

## 2020-07-25 ENCOUNTER — Telehealth: Payer: Self-pay | Admitting: Licensed Clinical Social Worker

## 2020-07-25 ENCOUNTER — Inpatient Hospital Stay (HOSPITAL_BASED_OUTPATIENT_CLINIC_OR_DEPARTMENT_OTHER): Payer: Medicaid Other | Admitting: Hospice and Palliative Medicine

## 2020-07-25 DIAGNOSIS — C50919 Malignant neoplasm of unspecified site of unspecified female breast: Secondary | ICD-10-CM

## 2020-07-25 DIAGNOSIS — Z515 Encounter for palliative care: Secondary | ICD-10-CM | POA: Diagnosis not present

## 2020-07-25 NOTE — Progress Notes (Signed)
Virtual Visit via Telephone Note  I connected with Jessica Willis on 07/25/20 at  1:00 PM EST by telephone and verified that I am speaking with the correct person using two identifiers.  Location: Patient: home Provider: clinic   I discussed the limitations, risks, security and privacy concerns of performing an evaluation and management service by telephone and the availability of in person appointments. I also discussed with the patient that there may be a patient responsible charge related to this service. The patient expressed understanding and agreed to proceed.   History of Present Illness: Jessica Willis is a 54 y.o. female with multiple medical problems including history of anemia who was hospitalized 06/15/2020 to 07/01/2020 with sepsis and acute hypoxic respiratory failure. She was found to have a fungating right breast mass with purulent drainage concerning for source of infection.CTA of the chest revealed near complete collapse of the right lung with extensive right-sided pleural effusion and mass-effect. Patient underwent chest tube insertion. Palliative care was consulted help address goals.    Observations/Objective: I spoke with patient by phone.  She reports that she is doing well.  She denies any significant changes or concerns.  No symptomatic complaints at present.  She reports good oral intake and stable performance status.  No issues with medications nor need for refills.  Patient says that she has some fatigue but finds that it is manageable well with daily activity.  She is ambulating with use of a walker outside the home.  No reported falls.  She says that she is maintaining functional independence well at home.  Assessment and Plan: Triple negative breast cancer -on treatment with systemic chemotherapy.  Seems to be tolerating well.  Followed by Dr. Tasia Catchings  Follow Up Instructions: Follow-up MyChart visit 1 to 2 months   I discussed the assessment and  treatment plan with the patient. The patient was provided an opportunity to ask questions and all were answered. The patient agreed with the plan and demonstrated an understanding of the instructions.   The patient was advised to call back or seek an in-person evaluation if the symptoms worsen or if the condition fails to improve as anticipated.  I provided 11 minutes of non-face-to-face time during this encounter.   Irean Hong, NP

## 2020-07-27 ENCOUNTER — Encounter: Payer: Self-pay | Admitting: Licensed Clinical Social Worker

## 2020-07-27 ENCOUNTER — Ambulatory Visit: Payer: Self-pay | Admitting: Licensed Clinical Social Worker

## 2020-07-27 DIAGNOSIS — Z803 Family history of malignant neoplasm of breast: Secondary | ICD-10-CM

## 2020-07-27 DIAGNOSIS — Z1379 Encounter for other screening for genetic and chromosomal anomalies: Secondary | ICD-10-CM

## 2020-07-27 DIAGNOSIS — C50919 Malignant neoplasm of unspecified site of unspecified female breast: Secondary | ICD-10-CM

## 2020-07-27 NOTE — Telephone Encounter (Signed)
Revealed negative genetic testing.   We discussed that we do not know why she has breast cancer or why there is cancer in the family. It could be due to a different gene that we are not testing, or something our current technology cannot pick up.  It will be important for her to keep in contact with genetics to learn if additional testing may be needed in the future.  

## 2020-07-27 NOTE — Progress Notes (Signed)
HPI:  Ms. Shirkey was previously seen in the [place] clinic due to a personal and family history of breast cancer and concerns regarding a hereditary predisposition to cancer. Please refer to our prior cancer genetics clinic note for more information regarding our discussion, assessment and recommendations, at the time. Ms. Mcenroe recent genetic test results were disclosed to her, as were recommendations warranted by these results. These results and recommendations are discussed in more detail below.  CANCER HISTORY:  Oncology History  Metastatic breast cancer ([place])  06/21/2020 Initial Diagnosis   Metastatic breast cancer ([place])   06/29/2020 -  Chemotherapy   The patient had PACLitaxel (TAXOL) 162 mg in sodium chloride 0.9 % 250 mL chemo infusion (</= 63m/m2), 80 mg/m2 = 162 mg, Intravenous,  Once, 2 of 4 cycles Administration: 162 mg (06/29/2020), 162 mg (07/06/2020), 162 mg (07/22/2020), 162 mg (07/13/2020)  for chemotherapy treatment.     Genetic Testing   Negative genetic testing. No pathogenic variants identified on the [facility] Common Hereditary Cancers Panel. The report date is 07/25/2020.  The Common Hereditary Cancers Panel offered by [facility] includes sequencing and/or deletion duplication testing of the following 48 genes: APC, ATM, AXIN2, BARD1, BMPR1A, BRCA1, BRCA2, BRIP1, CDH1, CDKN2A (p14ARF), CDKN2A (p16INK4a), CKD4, CHEK2, CTNNA1, DICER1, EPCAM (Deletion/duplication testing only), GREM1 (promoter region deletion/duplication testing only), KIT, MEN1, MLH1, MSH2, MSH3, MSH6, MUTYH, NBN, NF1, NHTL1, PALB2, PDGFRA, PMS2, POLD1, POLE, PTEN, RAD50, RAD51C, RAD51D, RNF43, SDHB, SDHC, SDHD, SMAD4, SMARCA4. STK11, TP53, TSC1, TSC2, and VHL.  The following genes were evaluated for sequence changes only: SDHA and HOXB13 c.251G>A variant only.     FAMILY HISTORY:  We obtained a detailed, 4-generation family history.  Significant diagnoses are listed below: Family  History  Problem Relation Age of Onset  . Diabetes Other   . Hypertension Other   . Diabetes Maternal Aunt   . Breast cancer Cousin        dx 351s . Breast cancer Cousin        dx 463s   Ms. Vento has 2 sons, ages 551and 376and a daughter, 55 no cancers. She has 2 full sisters, 3 full brothers and 1 half sister through her mother, no cancers.   Ms. FBoltemother is living at 819 She had about 13 siblings. Two of her sisters had daughters who had breast cancer, one in her 331sand one in her 410s No other known cancers on this side of the family.  Ms. FRuddenfather is living at 82 no reported family history of cancer on this side of the family.  Ms. FSzeligais unaware of previous family history of genetic testing for hereditary cancer risks.  There is no reported Ashkenazi Jewish ancestry. There is no known consanguinity.    GENETIC TEST RESULTS: Genetic testing reported out on 07/25/2020 through the [facility] Common Hereditary cancer panel found no pathogenic mutations.   The Common Hereditary Cancers Panel offered by [facility] includes sequencing and/or deletion duplication testing of the following 48 genes: APC, ATM, AXIN2, BARD1, BMPR1A, BRCA1, BRCA2, BRIP1, CDH1, CDKN2A (p14ARF), CDKN2A (p16INK4a), CKD4, CHEK2, CTNNA1, DICER1, EPCAM (Deletion/duplication testing only), GREM1 (promoter region deletion/duplication testing only), KIT, MEN1, MLH1, MSH2, MSH3, MSH6, MUTYH, NBN, NF1, NHTL1, PALB2, PDGFRA, PMS2, POLD1, POLE, PTEN, RAD50, RAD51C, RAD51D, RNF43, SDHB, SDHC, SDHD, SMAD4, SMARCA4. STK11, TP53, TSC1, TSC2, and VHL.  The following genes were evaluated for sequence changes only: SDHA and HOXB13 c.251G>A variant only.   The test report  has been scanned into EPIC and is located under the Molecular Pathology section of the Results Review tab.  A portion of the result report is included below for reference.     We discussed with Ms. Throckmorton that because current genetic  testing is not perfect, it is possible there may be a gene mutation in one of these genes that current testing cannot detect, but that chance is small.  We also discussed, that there could be another gene that has not yet been discovered, or that we have not yet tested, that is responsible for the cancer diagnoses in the family. It is also possible there is a hereditary cause for the cancer in the family that Ms. Vasek did not inherit and therefore was not identified in her testing.  Therefore, it is important to remain in touch with cancer genetics in the future so that we can continue to offer Ms. Degenhart the most up to date genetic testing.   ADDITIONAL GENETIC TESTING: We discussed with Ms. Noah that her genetic testing was fairly extensive.  If there are genes identified to increase cancer risk that can be analyzed in the future, we would be happy to discuss and coordinate this testing at that time.    CANCER SCREENING RECOMMENDATIONS: Ms. Bermea test result is considered negative (normal).  This means that we have not identified a hereditary cause for her  personal and family history of cancer at this time. Most cancers happen by chance and this negative test suggests that her cancer may fall into this category.    While reassuring, this does not definitively rule out a hereditary predisposition to cancer. It is still possible that there could be genetic mutations that are undetectable by current technology. There could be genetic mutations in genes that have not been tested or identified to increase cancer risk.  Therefore, it is recommended she continue to follow the cancer management and screening guidelines provided by her oncology and primary healthcare provider.   An individual's cancer risk and medical management are not determined by genetic test results alone. Overall cancer risk assessment incorporates additional factors, including personal medical history, family history, and any  available genetic information that may result in a personalized plan for cancer prevention and surveillance.  RECOMMENDATIONS FOR FAMILY MEMBERS:  Relatives in this family might be at some increased risk of developing cancer, over the general population risk, simply due to the family history of cancer.  We recommended female relatives in this family have a yearly mammogram beginning at age 34, or 1 years younger than the earliest onset of cancer, an annual clinical breast exam, and perform monthly breast self-exams. Female relatives in this family should also have a gynecological exam as recommended by their primary provider.  All family members should be referred for colonoscopy starting at age 20.    It is also possible there is a hereditary cause for the cancer in Ms. Hopfensperger's family that she did not inherit and therefore was not identified in her.  Based on Ms. Folino's family history, we recommended her cousins who had breast cancer have genetic counseling and testing. Ms. Weedon will let us know if we can be of any assistance in coordinating genetic counseling and/or testing for these family members.  FOLLOW-UP: Lastly, we discussed with Ms. Weatherman that cancer genetics is a rapidly advancing field and it is possible that new genetic tests will be appropriate for her and/or her family members in the future. We encouraged her to  remain in contact with cancer genetics on an annual basis so we can update her personal and family histories and let her know of advances in cancer genetics that may benefit this family.   Our contact number was provided. Ms. Earnhart questions were answered to her satisfaction, and she knows she is welcome to call us at anytime with additional questions or concerns.   Faith Rogue, MS, [facility] Genetic Counselor [email or web address] Phone: [phone]

## 2020-07-29 ENCOUNTER — Other Ambulatory Visit: Payer: Self-pay

## 2020-07-29 ENCOUNTER — Inpatient Hospital Stay: Payer: Medicaid Other

## 2020-07-29 ENCOUNTER — Encounter: Payer: Self-pay | Admitting: Oncology

## 2020-07-29 ENCOUNTER — Inpatient Hospital Stay (HOSPITAL_BASED_OUTPATIENT_CLINIC_OR_DEPARTMENT_OTHER): Payer: Medicaid Other | Admitting: Oncology

## 2020-07-29 VITALS — BP 130/75 | HR 115 | Temp 98.2°F | Resp 16 | Wt 187.1 lb

## 2020-07-29 DIAGNOSIS — K869 Disease of pancreas, unspecified: Secondary | ICD-10-CM

## 2020-07-29 DIAGNOSIS — C50919 Malignant neoplasm of unspecified site of unspecified female breast: Secondary | ICD-10-CM

## 2020-07-29 DIAGNOSIS — C7951 Secondary malignant neoplasm of bone: Secondary | ICD-10-CM

## 2020-07-29 DIAGNOSIS — Z5111 Encounter for antineoplastic chemotherapy: Secondary | ICD-10-CM

## 2020-07-29 DIAGNOSIS — Z803 Family history of malignant neoplasm of breast: Secondary | ICD-10-CM

## 2020-07-29 DIAGNOSIS — E559 Vitamin D deficiency, unspecified: Secondary | ICD-10-CM

## 2020-07-29 LAB — CBC WITH DIFFERENTIAL/PLATELET
Abs Immature Granulocytes: 0.05 10*3/uL (ref 0.00–0.07)
Basophils Absolute: 0.1 10*3/uL (ref 0.0–0.1)
Basophils Relative: 1 %
Eosinophils Absolute: 0.1 10*3/uL (ref 0.0–0.5)
Eosinophils Relative: 1 %
HCT: 33.2 % — ABNORMAL LOW (ref 36.0–46.0)
Hemoglobin: 10.5 g/dL — ABNORMAL LOW (ref 12.0–15.0)
Immature Granulocytes: 1 %
Lymphocytes Relative: 23 %
Lymphs Abs: 1.6 10*3/uL (ref 0.7–4.0)
MCH: 26 pg (ref 26.0–34.0)
MCHC: 31.6 g/dL (ref 30.0–36.0)
MCV: 82.2 fL (ref 80.0–100.0)
Monocytes Absolute: 0.5 10*3/uL (ref 0.1–1.0)
Monocytes Relative: 7 %
Neutro Abs: 4.6 10*3/uL (ref 1.7–7.7)
Neutrophils Relative %: 67 %
Platelets: 410 10*3/uL — ABNORMAL HIGH (ref 150–400)
RBC: 4.04 MIL/uL (ref 3.87–5.11)
RDW: 16.6 % — ABNORMAL HIGH (ref 11.5–15.5)
WBC: 6.9 10*3/uL (ref 4.0–10.5)
nRBC: 0 % (ref 0.0–0.2)

## 2020-07-29 LAB — COMPREHENSIVE METABOLIC PANEL
ALT: 12 U/L (ref 0–44)
AST: 25 U/L (ref 15–41)
Albumin: 3.6 g/dL (ref 3.5–5.0)
Alkaline Phosphatase: 71 U/L (ref 38–126)
Anion gap: 6 (ref 5–15)
BUN: 8 mg/dL (ref 6–20)
CO2: 27 mmol/L (ref 22–32)
Calcium: 8.6 mg/dL — ABNORMAL LOW (ref 8.9–10.3)
Chloride: 104 mmol/L (ref 98–111)
Creatinine, Ser: 0.5 mg/dL (ref 0.44–1.00)
GFR, Estimated: >60 mL/min (ref >60)
Glucose, Bld: 151 mg/dL — ABNORMAL HIGH (ref 70–99)
Potassium: 4 mmol/L (ref 3.5–5.1)
Sodium: 137 mmol/L (ref 135–145)
Total Bilirubin: 0.4 mg/dL (ref 0.3–1.2)
Total Protein: 6.8 g/dL (ref 6.5–8.1)

## 2020-07-29 MED ORDER — FAMOTIDINE IN NACL 20-0.9 MG/50ML-% IV SOLN
20.0000 mg | Freq: Once | INTRAVENOUS | Status: AC
  Administered 2020-07-29: 10:00:00 20 mg via INTRAVENOUS
  Filled 2020-07-29: qty 50

## 2020-07-29 MED ORDER — SODIUM CHLORIDE 0.9 % IV SOLN
10.0000 mg | Freq: Once | INTRAVENOUS | Status: AC
  Administered 2020-07-29: 10:00:00 10 mg via INTRAVENOUS
  Filled 2020-07-29: qty 10

## 2020-07-29 MED ORDER — SODIUM CHLORIDE 0.9 % IV SOLN
Freq: Once | INTRAVENOUS | Status: AC
  Filled 2020-07-29: qty 250

## 2020-07-29 MED ORDER — DIPHENHYDRAMINE HCL 50 MG/ML IJ SOLN
50.0000 mg | Freq: Once | INTRAMUSCULAR | Status: AC
  Administered 2020-07-29: 10:00:00 50 mg via INTRAVENOUS
  Filled 2020-07-29: qty 1

## 2020-07-29 MED ORDER — HEPARIN SOD (PORK) LOCK FLUSH 100 UNIT/ML IV SOLN
500.0000 [IU] | Freq: Once | INTRAVENOUS | Status: AC | PRN
  Administered 2020-07-29: 12:00:00 500 [IU]
  Filled 2020-07-29: qty 5

## 2020-07-29 MED ORDER — HEPARIN SOD (PORK) LOCK FLUSH 100 UNIT/ML IV SOLN
INTRAVENOUS | Status: AC
  Filled 2020-07-29: qty 5

## 2020-07-29 MED ORDER — SODIUM CHLORIDE 0.9 % IV SOLN
80.0000 mg/m2 | Freq: Once | INTRAVENOUS | Status: AC
  Administered 2020-07-29: 11:00:00 162 mg via INTRAVENOUS
  Filled 2020-07-29: qty 27

## 2020-07-29 NOTE — Progress Notes (Signed)
Stable at discharge 

## 2020-07-29 NOTE — Progress Notes (Signed)
Patient reports having a vision issue that is hard for her to explain.

## 2020-07-29 NOTE — Progress Notes (Signed)
Hematology/Oncology Follow Up Note [facility]  [phone] [phone]  Patient Care Team: [place] as PCP - General Rico Junker, RN as Registered Nurse   Name of the patient: Jessica Willis  [id number]  Nov 13, 1965   REASON FOR VISIT  follow-up for triple negative breast cancer  INTERVAL HISTORY 54 y.o. with past medical history including anemia, family history of breast cancer presents for follow-up of management of metastatic triple negative breast cancer.  06/15/2020-07/01/2020 Patient was admitted due to shortness of breath.  Also reported to have right breast cancer which she has noticed for about at least 6 months.  Initial x-ray and a CT showed massive right-sided pleural effusion , mass-effect and shift to the midline, near complete complex of the right lung, right breast mass extending to the skin surface associated with skin thickening and right axillary lymphadenopathy. Patient had initially pleural catheter placement later chest tube placement. Patient was admitted to ICU due to respiratory failure.  She was also seen by Dr. Genevive Bi initially and there was an attempt for tac pleurodesis which was not successful.  Chest tube was removed. Patient's respiratory status improved. Initial pleural effusion cytology came back positive for adenocarcinoma compatible with breast origin.  Triple negative due to lack of tissue for additional testing.  Patient admitted breast mass biopsy as well as Mediport placement by Dr. Lysle Pearl.  Patient was given first dose of Taxol 80 mg/m during her admission and she tolerated well. Patient was discharged on 07/01/2020.  Respiratory status improved and she was discharged home without home oxygen.  # MRI brain showed no intracranial brain metastasis.  1 cm metastasis affecting the clivus.  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  NGS showed AXIN1 G4255, LAMP1 gain, LRP1B  E4499, MYC gain, NOTCH-BRD4 fusion. MS Stable, CPS <1 [employer] genetic testing: Negative  INTERVAL HISTORY [name] is a 54 y.o. female who has above history reviewed by me today presents for follow up visit for management of triple negative breast cancer Problems and complaints are listed below: Patient has been on weekly Taxol for treatment of metastatic triple negative breast cancer. Her breathing status have improved. She has home service for wound care.  Denies any concern of her right breast mass/wound Intermittent numbness and tingling    Review of Systems  Constitutional: Positive for fatigue. Negative for appetite change, chills and fever.  HENT:   Negative for hearing loss and voice change.   Eyes: Negative for eye problems.  Respiratory: Negative for chest tightness, cough and shortness of breath.   Cardiovascular: Positive for leg swelling. Negative for chest pain.  Gastrointestinal: Negative for abdominal distention, abdominal pain and blood in stool.  Endocrine: Negative for hot flashes.  Genitourinary: Negative for difficulty urinating and frequency.   Musculoskeletal: Negative for arthralgias.  Skin: Negative for itching and rash.  Neurological: Negative for extremity weakness.  Hematological: Negative for adenopathy.  Psychiatric/Behavioral: Negative for confusion.      No Known Allergies   Past Medical History:  Diagnosis Date  . Anemia   . Family history of breast cancer   . Patient denies medical problems      Past Surgical History:  Procedure Laterality Date  . BREAST BIOPSY  06/28/2020   Procedure: BREAST BIOPSY;  Surgeon: Benjamine Sprague, DO;  Location: [facility] ORS;  Service: General;;  . PORTACATH PLACEMENT N/A 06/28/2020   Procedure: INSERTION PORT-A-CATH;  Surgeon: Benjamine Sprague, DO;  Location: [facility] ORS;  Service:  General;  Laterality: N/A;  . TUBAL LIGATION    . VIDEO ASSISTED THORACOSCOPY (VATS)/THOROCOTOMY Right 06/23/2020   Procedure:  ATTEMPTED VIDEO ASSISTED THORACOSCOPY (VATS);  Surgeon: Nestor Lewandowsky, MD;  Location: [facility] ORS;  Service: General;  Laterality: Right;    Social History   Socioeconomic History  . Marital status: Single    Spouse name: Not on file  . Number of children: Not on file  . Years of education: Not on file  . Highest education level: Not on file  Occupational History  . Not on file  Tobacco Use  . Smoking status: Never Smoker  . Smokeless tobacco: Never Used  Substance and Sexual Activity  . Alcohol use: Not Currently    Alcohol/week: 1.0 standard drink    Types: 1 Cans of beer per week  . Drug use: No  . Sexual activity: Not on file  Other Topics Concern  . Not on file  Social History Narrative  . Not on file   Social Determinants of Health   Financial Resource Strain: Not on file  Food Insecurity: Not on file  Transportation Needs: Not on file  Physical Activity: Not on file  Stress: Not on file  Social Connections: Not on file  Intimate Partner Violence: Not on file    Family History  Problem Relation Age of Onset  . Diabetes Other   . Hypertension Other   . Diabetes Maternal Aunt   . Breast cancer Cousin        dx 44s  . Breast cancer Cousin        dx 94s     Current Outpatient Medications:  .  calcium carbonate (CALCIUM 600) 600 MG TABS tablet, Take 2 tablets (1,200 mg total) by mouth daily with breakfast., Disp: 60 tablet, Rfl: 1 .  cholecalciferol (VITAMIN D3) 25 MCG (1000 UNIT) tablet, TAKE 1 TABLET BY MOUTH EVERY DAY (Patient not taking: Reported on 07/29/2020), Disp: 30 tablet, Rfl: 0 .  ergocalciferol (VITAMIN D2) 1.25 MG (50000 UT) capsule, Take 1 capsule (50,000 Units total) by mouth once a week for 6 doses., Disp: 6 capsule, Rfl: 0 .  ferrous sulfate 325 (65 FE) MG EC tablet, Take 1 tablet (325 mg total) by mouth 2 (two) times daily with a meal., Disp: 60 tablet, Rfl: 1 .  folic acid (FOLVITE) 1 MG tablet, Take 1 tablet (1 mg total) by mouth daily., Disp:  60 tablet, Rfl: 0 .  lidocaine-prilocaine (EMLA) cream, Apply 1 application topically as needed. Place a small amount of cream to port site prior to each chemo treatment, 1-2 hours before treatment., Disp: 30 g, Rfl: 0 .  pantoprazole (PROTONIX) 40 MG tablet, TAKE 1 TABLET BY MOUTH EVERY DAY, Disp: 30 tablet, Rfl: 0  Physical exam:  Vitals:   07/29/20 0855 07/29/20 0904  BP: 130/75   Pulse: (!) 121 (!) 115  Resp: 16   Temp: 98.2 F (36.8 C)   TempSrc: Tympanic   SpO2: 97%   Weight: 187 lb 1.6 oz (84.9 kg)    Physical Exam Constitutional:      General: She is not in acute distress.    Comments: Thin built  HENT:     Head: Normocephalic and atraumatic.  Eyes:     General: No scleral icterus. Cardiovascular:     Rate and Rhythm: Normal rate and regular rhythm.     Heart sounds: Normal heart sounds.  Pulmonary:     Effort: Pulmonary effort is normal. No respiratory distress.  Breath sounds: No wheezing.  Abdominal:     General: Bowel sounds are normal. There is no distension.     Palpations: Abdomen is soft.  Musculoskeletal:        General: No deformity. Normal range of motion.     Cervical back: Normal range of motion and neck supple.     Comments: Bilateral ankle edema  Skin:    General: Skin is warm and dry.     Findings: No erythema or rash.     Comments: + Left anterior chest wall Mediport.  Neurological:     Mental Status: She is alert and oriented to person, place, and time. Mental status is at baseline.     Cranial Nerves: No cranial nerve deficit.     Coordination: Coordination normal.  Psychiatric:        Mood and Affect: Mood normal.   Large right breast mass with skin involvement.  CMP Latest Ref Rng & Units 07/29/2020  Glucose 70 - 99 mg/dL 151(H)  BUN 6 - 20 mg/dL 8  Creatinine 0.44 - 1.00 mg/dL 0.50  Sodium 135 - 145 mmol/L 137  Potassium 3.5 - 5.1 mmol/L 4.0  Chloride 98 - 111 mmol/L 104  CO2 22 - 32 mmol/L 27  Calcium 8.9 - 10.3 mg/dL 8.6(L)   Total Protein 6.5 - 8.1 g/dL 6.8  Total Bilirubin 0.3 - 1.2 mg/dL 0.4  Alkaline Phos 38 - 126 U/L 71  AST 15 - 41 U/L 25  ALT 0 - 44 U/L 12   CBC Latest Ref Rng & Units 07/29/2020  WBC 4.0 - 10.5 K/uL 6.9  Hemoglobin 12.0 - 15.0 g/dL 10.5(L)  Hematocrit 36.0 - 46.0 % 33.2(L)  Platelets 150 - 400 K/uL 410(H)    RADIOGRAPHIC STUDIES: I have personally reviewed the radiological images as listed and agreed with the findings in the report. NM PET Image Initial (PI) Skull Base To Thigh  Result Date: 07/20/2020 CLINICAL DATA:  Initial treatment strategy for breast cancer. EXAM: NUCLEAR MEDICINE PET SKULL BASE TO THIGH TECHNIQUE: 9.9 mCi F-18 FDG was injected intravenously. Full-ring PET imaging was performed from the skull base to thigh after the radiotracer. CT data was obtained and used for attenuation correction and anatomic localization. Fasting blood glucose: 105 mg/dl COMPARISON:  MR total spine 06/25/2020, CT chest 06/15/2020. FINDINGS: Mediastinal blood pool activity: SUV max 2.1 Liver activity: SUV max NA NECK: No abnormal hypermetabolism. Incidental CT findings: None. CHEST: There are hypermetabolic bilateral mediastinal and hilar lymph nodes. Index high right paratracheal lymph node measures 11 mm (3/75) with an SUV max of 3.4. Hypermetabolic bilateral axillary lymph nodes, right greater than left. Index right axillary lymph node measures 11 mm (3/92) with an SUV max of 7.5. Hypermetabolic prepericardiac adenopathy measures up to 10 mm an SUV max of 4.3. Hypermetabolic thickening of the right hemidiaphragm is seen along the ventral margin of the liver (3/133), with an SUV max of 4.1. Hypermetabolic right breast mass measures approximately 4.2 x 4.9 cm with an SUV max up to 13.1. Additional hypermetabolic subcutaneous nodularity in the upper right ventral chest wall with overlying skin thickening. Focal hypermetabolism posterior to the mid descending thoracic aorta corresponds to 1.8 cm pleural  nodule, with an SUV max of 6.0. Incidental CT findings: Left-sided Port-A-Cath terminates in the right atrium. Heart is enlarged. No pericardial effusion. Large partially loculated right pleural effusion. Tiny left pleural effusion. Nodularity is seen along the left major fissure. Nodules are too small for PET resolution. ABDOMEN/PELVIS:  No definitive hypermetabolism within the liver, adrenal glands or spleen. Focal hypermetabolism in the pancreatic tail, SUV 3.5, corresponding to an 11 mm low-attenuation lesion on CT (3/145). No hypermetabolic lymph nodes. Incidental CT findings: Liver, gallbladder, adrenal glands, kidneys, spleen, pancreas, stomach and bowel are otherwise unremarkable. Small pelvic free fluid. SKELETON: Hypermetabolic lesions are seen throughout the visualized osseous structures. Index lesion in the right sacrum has an SUV max of 6.2. Incidental CT findings: Predominately sclerotic lesions are seen throughout the visualized osseous structures. IMPRESSION: 1. Metastatic breast cancer involving the right breast/chest wall, bilateral mediastinal, hilar and axillary lymph nodes, left hemithorax and bones. 2. Hypermetabolic lesion in the pancreatic tail may represent a metastasis or primary lesion. Electronically Signed   By: Lorin Picket M.D.   On: 07/20/2020 09:33   US Venous Img Lower Bilateral  Result Date: 07/15/2020 CLINICAL DATA:  54 year old female with a history of bilateral lower extremity swelling EXAM: BILATERAL LOWER EXTREMITY VENOUS DOPPLER ULTRASOUND TECHNIQUE: Gray-scale sonography with graded compression, as well as color Doppler and duplex ultrasound were performed to evaluate the lower extremity deep venous systems from the level of the common femoral vein and including the common femoral, femoral, profunda femoral, popliteal and calf veins including the posterior tibial, peroneal and gastrocnemius veins when visible. The superficial great saphenous vein was also interrogated.  Spectral Doppler was utilized to evaluate flow at rest and with distal augmentation maneuvers in the common femoral, femoral and popliteal veins. COMPARISON:  None. FINDINGS: RIGHT LOWER EXTREMITY Common Femoral Vein: No evidence of thrombus. Normal compressibility, respiratory phasicity and response to augmentation. Saphenofemoral Junction: No evidence of thrombus. Normal compressibility and flow on color Doppler imaging. Profunda Femoral Vein: No evidence of thrombus. Normal compressibility and flow on color Doppler imaging. Femoral Vein: No evidence of thrombus. Normal compressibility, respiratory phasicity and response to augmentation. Popliteal Vein: No evidence of thrombus. Normal compressibility, respiratory phasicity and response to augmentation. Calf Veins: No evidence of thrombus. Normal compressibility and flow on color Doppler imaging. Superficial Great Saphenous Vein: No evidence of thrombus. Normal compressibility and flow on color Doppler imaging. Other Findings:  None. LEFT LOWER EXTREMITY Common Femoral Vein: No evidence of thrombus. Normal compressibility, respiratory phasicity and response to augmentation. Saphenofemoral Junction: No evidence of thrombus. Normal compressibility and flow on color Doppler imaging. Profunda Femoral Vein: No evidence of thrombus. Normal compressibility and flow on color Doppler imaging. Femoral Vein: No evidence of thrombus. Normal compressibility, respiratory phasicity and response to augmentation. Popliteal Vein: No evidence of thrombus. Normal compressibility, respiratory phasicity and response to augmentation. Calf Veins: No evidence of thrombus. Normal compressibility and flow on color Doppler imaging. Superficial Great Saphenous Vein: No evidence of thrombus. Normal compressibility and flow on color Doppler imaging. Other Findings:  None. IMPRESSION: Sonographic survey of the bilateral lower extremities negative for DVT Signed, Dulcy Fanny. Dellia Nims, RPVI [facility] Electronically Signed   By: Corrie Mckusick D.O.   On: 07/15/2020 14:30     Assessment and plan Patient is a 54 y.o. female presents for follow-up of metastatic triple negative breast cancer 1. Metastatic breast cancer ([place])   2. Family history of breast cancer   3. Pancreatic lesion   4. Encounter for antineoplastic chemotherapy   5. Bone metastasis ([place])   6. Vitamin D deficiency    #Stage IV metastatic triple negative breast cancer.  On palliative chemotherapy. Labs are reviewed and discussed with patient. Proceed with Taxol 80 mg per metered  squared today. Overall she tolerates well. She will take next week off, and will resume her weekly Taxol 3 weeks on 1 week off regimen.  #Pancreatic lesion, metastasis versus inflammatory process versus primary pancreatic lesion.  CA 19-9 is negative.  We will follow up this nodule on image #Tachycardia, this is has been a chronic issue for her.  Continue monitor. #Malignant pleural effusion on the right large loculated right pleural effusion on PET scan.  Monitor.  She does not have significant shortness of breath.  #Bone metastasis, status post 1 dose of Zometa on 07/06/2020, recommend monthly Zometa.. Continue calcium and vitamin D supplementation.  #Vitamin D deficiency, continue vitamin D 50,000 weekly x6..  # Iron deficiency anemia s/p IV venofer x 2 Recommend patient to start oral iron supplementation ferrous sulfate 325 mg twice daily.  Hemoglobin stable. #Leg swelling, no DVT.  Recommend leg elevation.  #GERD, continue Protonix 40 mg daily. #Family history of cancer, genetic testing is negative To follow-up with palliative care service patient will follow up in 2 weeks   Earlie Server, MD, PhD Hematology Oncology [facility] at [facility] Pager- [phone] 07/29/2020

## 2020-08-04 ENCOUNTER — Other Ambulatory Visit: Payer: Self-pay

## 2020-08-04 ENCOUNTER — Ambulatory Visit: Payer: Self-pay

## 2020-08-04 ENCOUNTER — Ambulatory Visit: Payer: Self-pay | Admitting: Oncology

## 2020-08-11 ENCOUNTER — Inpatient Hospital Stay: Payer: Medicaid Other

## 2020-08-11 ENCOUNTER — Inpatient Hospital Stay (HOSPITAL_BASED_OUTPATIENT_CLINIC_OR_DEPARTMENT_OTHER): Payer: Medicaid Other | Admitting: Oncology

## 2020-08-11 ENCOUNTER — Encounter: Payer: Self-pay | Admitting: Oncology

## 2020-08-11 ENCOUNTER — Other Ambulatory Visit: Payer: Self-pay

## 2020-08-11 VITALS — BP 144/83 | HR 112 | Temp 98.0°F | Wt 188.8 lb

## 2020-08-11 DIAGNOSIS — Z5111 Encounter for antineoplastic chemotherapy: Secondary | ICD-10-CM

## 2020-08-11 DIAGNOSIS — C7951 Secondary malignant neoplasm of bone: Secondary | ICD-10-CM

## 2020-08-11 DIAGNOSIS — C50919 Malignant neoplasm of unspecified site of unspecified female breast: Secondary | ICD-10-CM

## 2020-08-11 DIAGNOSIS — E559 Vitamin D deficiency, unspecified: Secondary | ICD-10-CM

## 2020-08-11 DIAGNOSIS — K869 Disease of pancreas, unspecified: Secondary | ICD-10-CM

## 2020-08-11 LAB — COMPREHENSIVE METABOLIC PANEL
ALT: 10 U/L (ref 0–44)
AST: 27 U/L (ref 15–41)
Albumin: 3.4 g/dL — ABNORMAL LOW (ref 3.5–5.0)
Alkaline Phosphatase: 69 U/L (ref 38–126)
Anion gap: 7 (ref 5–15)
BUN: 6 mg/dL (ref 6–20)
CO2: 26 mmol/L (ref 22–32)
Calcium: 8.5 mg/dL — ABNORMAL LOW (ref 8.9–10.3)
Chloride: 104 mmol/L (ref 98–111)
Creatinine, Ser: 0.41 mg/dL — ABNORMAL LOW (ref 0.44–1.00)
GFR, Estimated: >60 mL/min (ref >60)
Glucose, Bld: 145 mg/dL — ABNORMAL HIGH (ref 70–99)
Potassium: 3.4 mmol/L — ABNORMAL LOW (ref 3.5–5.1)
Sodium: 137 mmol/L (ref 135–145)
Total Bilirubin: 0.4 mg/dL (ref 0.3–1.2)
Total Protein: 7 g/dL (ref 6.5–8.1)

## 2020-08-11 LAB — CBC WITH DIFFERENTIAL/PLATELET
Abs Immature Granulocytes: 0.04 10*3/uL (ref 0.00–0.07)
Basophils Absolute: 0.1 10*3/uL (ref 0.0–0.1)
Basophils Relative: 1 %
Eosinophils Absolute: 0.1 10*3/uL (ref 0.0–0.5)
Eosinophils Relative: 1 %
HCT: 31.8 % — ABNORMAL LOW (ref 36.0–46.0)
Hemoglobin: 10.3 g/dL — ABNORMAL LOW (ref 12.0–15.0)
Immature Granulocytes: 0 %
Lymphocytes Relative: 14 %
Lymphs Abs: 1.4 10*3/uL (ref 0.7–4.0)
MCH: 26.4 pg (ref 26.0–34.0)
MCHC: 32.4 g/dL (ref 30.0–36.0)
MCV: 81.5 fL (ref 80.0–100.0)
Monocytes Absolute: 0.7 10*3/uL (ref 0.1–1.0)
Monocytes Relative: 7 %
Neutro Abs: 7.7 10*3/uL (ref 1.7–7.7)
Neutrophils Relative %: 77 %
Platelets: 305 10*3/uL (ref 150–400)
RBC: 3.9 MIL/uL (ref 3.87–5.11)
RDW: 16.7 % — ABNORMAL HIGH (ref 11.5–15.5)
WBC: 10 10*3/uL (ref 4.0–10.5)
nRBC: 0 % (ref 0.0–0.2)

## 2020-08-11 MED ORDER — SODIUM CHLORIDE 0.9% FLUSH
10.0000 mL | Freq: Once | INTRAVENOUS | Status: AC
  Administered 2020-08-11: 09:00:00 10 mL via INTRAVENOUS
  Filled 2020-08-11: qty 10

## 2020-08-11 MED ORDER — SODIUM CHLORIDE 0.9 % IV SOLN
Freq: Once | INTRAVENOUS | Status: AC
  Filled 2020-08-11: qty 250

## 2020-08-11 MED ORDER — DIPHENHYDRAMINE HCL 50 MG/ML IJ SOLN
50.0000 mg | Freq: Once | INTRAMUSCULAR | Status: AC
  Administered 2020-08-11: 12:00:00 50 mg via INTRAVENOUS
  Filled 2020-08-11: qty 1

## 2020-08-11 MED ORDER — ZOLEDRONIC ACID 4 MG/100ML IV SOLN
4.0000 mg | Freq: Once | INTRAVENOUS | Status: AC
  Administered 2020-08-11: 10:00:00 4 mg via INTRAVENOUS
  Filled 2020-08-11: qty 100

## 2020-08-11 MED ORDER — HEPARIN SOD (PORK) LOCK FLUSH 100 UNIT/ML IV SOLN
500.0000 [IU] | Freq: Once | INTRAVENOUS | Status: AC
  Administered 2020-08-11: 13:00:00 500 [IU] via INTRAVENOUS
  Filled 2020-08-11: qty 5

## 2020-08-11 MED ORDER — HEPARIN SOD (PORK) LOCK FLUSH 100 UNIT/ML IV SOLN
INTRAVENOUS | Status: AC
  Filled 2020-08-11: qty 5

## 2020-08-11 MED ORDER — SODIUM CHLORIDE 0.9 % IV SOLN
80.0000 mg/m2 | Freq: Once | INTRAVENOUS | Status: AC
  Administered 2020-08-11: 12:00:00 162 mg via INTRAVENOUS
  Filled 2020-08-11: qty 27

## 2020-08-11 MED ORDER — SODIUM CHLORIDE 0.9 % IV SOLN
10.0000 mg | Freq: Once | INTRAVENOUS | Status: AC
  Administered 2020-08-11: 11:00:00 10 mg via INTRAVENOUS
  Filled 2020-08-11: qty 10

## 2020-08-11 MED ORDER — FAMOTIDINE IN NACL 20-0.9 MG/50ML-% IV SOLN
20.0000 mg | Freq: Once | INTRAVENOUS | Status: AC
  Administered 2020-08-11: 11:00:00 20 mg via INTRAVENOUS
  Filled 2020-08-11: qty 50

## 2020-08-11 NOTE — Progress Notes (Signed)
Patient has right ankle edema that does improve with elevation.

## 2020-08-11 NOTE — Progress Notes (Signed)
Hematology/Oncology Follow Up Note [facility]  [phone] [phone]  Patient Care Team: [place] as PCP - General Rico Junker, RN as Registered Nurse   Name of the patient: Jessica Willis  [id number]  1966/01/01   REASON FOR VISIT  follow-up for triple negative breast cancer  INTERVAL HISTORY 54 y.o. with past medical history including anemia, family history of breast cancer presents for follow-up of management of metastatic triple negative breast cancer.  06/15/2020-07/01/2020 Patient was admitted due to shortness of breath.  Also reported to have right breast cancer which she has noticed for about at least 6 months.  Initial x-ray and a CT showed massive right-sided pleural effusion , mass-effect and shift to the midline, near complete complex of the right lung, right breast mass extending to the skin surface associated with skin thickening and right axillary lymphadenopathy. Patient had initially pleural catheter placement later chest tube placement. Patient was admitted to ICU due to respiratory failure.  She was also seen by Dr. Genevive Bi initially and there was an attempt for tac pleurodesis which was not successful.  Chest tube was removed. Patient's respiratory status improved. Initial pleural effusion cytology came back positive for adenocarcinoma compatible with breast origin.  Triple negative due to lack of tissue for additional testing.  Patient admitted breast mass biopsy as well as Mediport placement by Dr. Lysle Pearl.  Patient was given first dose of Taxol 80 mg/m during her admission and she tolerated well. Patient was discharged on 07/01/2020.  Respiratory status improved and she was discharged home without home oxygen.  # MRI brain showed no intracranial brain metastasis.  1 cm metastasis affecting the clivus.  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  NGS showed AXIN1 G4255, LAMP1 gain, LRP1B  E4499, MYC gain, NOTCH-BRD4 fusion. MS Stable, CPS <1 [employer] genetic testing: Negative  INTERVAL HISTORY Jessica Willis is a 54 y.o. female who has above history reviewed by me today presents for follow up visit for management of triple negative breast cancer Problems and complaints are listed below: Patient has been on weekly Taxol for treatment of metastatic triple negative breast cancer. Shortness of breath and tachycardia with exertion. She has home health service once a week for wound care.  Denies any concern of her right breast mass/wound Intermittent numbness and tingling    Review of Systems  Constitutional: Positive for fatigue. Negative for appetite change, chills and fever.  HENT:   Negative for hearing loss and voice change.   Eyes: Negative for eye problems.  Respiratory: Positive for shortness of breath. Negative for chest tightness and cough.   Cardiovascular: Positive for leg swelling. Negative for chest pain.  Gastrointestinal: Negative for abdominal distention, abdominal pain and blood in stool.  Endocrine: Negative for hot flashes.  Genitourinary: Negative for difficulty urinating and frequency.   Musculoskeletal: Negative for arthralgias.  Skin: Negative for itching and rash.  Neurological: Negative for extremity weakness.  Hematological: Negative for adenopathy.  Psychiatric/Behavioral: Negative for confusion.      No Known Allergies   Past Medical History:  Diagnosis Date  . Anemia   . Family history of breast cancer   . Patient denies medical problems      Past Surgical History:  Procedure Laterality Date  . BREAST BIOPSY  06/28/2020   Procedure: BREAST BIOPSY;  Surgeon: Benjamine Sprague, DO;  Location: [facility] ORS;  Service: General;;  . PORTACATH PLACEMENT N/A 06/28/2020   Procedure: INSERTION PORT-A-CATH;  Surgeon: Lysle Pearl,  Isami, DO;  Location: [facility] ORS;  Service: General;  Laterality: N/A;  . TUBAL LIGATION    . VIDEO ASSISTED THORACOSCOPY  (VATS)/THOROCOTOMY Right 06/23/2020   Procedure: ATTEMPTED VIDEO ASSISTED THORACOSCOPY (VATS);  Surgeon: Nestor Lewandowsky, MD;  Location: [facility] ORS;  Service: General;  Laterality: Right;    Social History   Socioeconomic History  . Marital status: Single    Spouse name: Not on file  . Number of children: Not on file  . Years of education: Not on file  . Highest education level: Not on file  Occupational History  . Not on file  Tobacco Use  . Smoking status: Never Smoker  . Smokeless tobacco: Never Used  Substance and Sexual Activity  . Alcohol use: Not Currently    Alcohol/week: 1.0 standard drink    Types: 1 Cans of beer per week  . Drug use: No  . Sexual activity: Not on file  Other Topics Concern  . Not on file  Social History Narrative  . Not on file   Social Determinants of Health   Financial Resource Strain: Not on file  Food Insecurity: Not on file  Transportation Needs: Not on file  Physical Activity: Not on file  Stress: Not on file  Social Connections: Not on file  Intimate Partner Violence: Not on file    Family History  Problem Relation Age of Onset  . Diabetes Other   . Hypertension Other   . Diabetes Maternal Aunt   . Breast cancer Cousin        dx 43s  . Breast cancer Cousin        dx 3s     Current Outpatient Medications:  .  calcium carbonate (CALCIUM 600) 600 MG TABS tablet, Take 2 tablets (1,200 mg total) by mouth daily with breakfast., Disp: 60 tablet, Rfl: 1 .  cholecalciferol (VITAMIN D3) 25 MCG (1000 UNIT) tablet, TAKE 1 TABLET BY MOUTH EVERY DAY (Patient not taking: No sig reported), Disp: 30 tablet, Rfl: 0 .  ergocalciferol (VITAMIN D2) 1.25 MG (50000 UT) capsule, Take 1 capsule (50,000 Units total) by mouth once a week for 6 doses., Disp: 6 capsule, Rfl: 0 .  ferrous sulfate 325 (65 FE) MG EC tablet, Take 1 tablet (325 mg total) by mouth 2 (two) times daily with a meal., Disp: 60 tablet, Rfl: 1 .  folic acid (FOLVITE) 1 MG tablet, Take 1  tablet (1 mg total) by mouth daily., Disp: 60 tablet, Rfl: 0 .  lidocaine-prilocaine (EMLA) cream, Apply 1 application topically as needed. Place a small amount of cream to port site prior to each chemo treatment, 1-2 hours before treatment., Disp: 30 g, Rfl: 0 .  pantoprazole (PROTONIX) 40 MG tablet, TAKE 1 TABLET BY MOUTH EVERY DAY, Disp: 30 tablet, Rfl: 0  Physical exam:  Vitals:   08/11/20 0931 08/11/20 0936  BP: (!) 144/83   Pulse: (!) 120 (!) 112  Temp: 98 F (36.7 C)   TempSrc: Tympanic   SpO2: 95%   Weight: 188 lb 12.8 oz (85.6 kg)    Physical Exam Constitutional:      General: She is not in acute distress.    Comments: Thin built  HENT:     Head: Normocephalic and atraumatic.  Eyes:     General: No scleral icterus. Cardiovascular:     Rate and Rhythm: Normal rate and regular rhythm.     Heart sounds: Normal heart sounds.  Pulmonary:     Effort: Pulmonary effort is  normal. No respiratory distress.     Breath sounds: No wheezing.     Comments: Decreased breath sound on the right. Abdominal:     General: Bowel sounds are normal. There is no distension.     Palpations: Abdomen is soft.  Musculoskeletal:        General: No deformity. Normal range of motion.     Cervical back: Normal range of motion and neck supple.     Comments: Bilateral ankle edema  Skin:    General: Skin is warm and dry.     Findings: No erythema or rash.     Comments: + Left anterior chest wall Mediport.  Neurological:     Mental Status: She is alert and oriented to person, place, and time. Mental status is at baseline.     Cranial Nerves: No cranial nerve deficit.     Coordination: Coordination normal.  Psychiatric:        Mood and Affect: Mood normal.   Large right breast mass with skin involvement.  CMP Latest Ref Rng & Units 08/11/2020  Glucose 70 - 99 mg/dL 657(Q)  BUN 6 - 20 mg/dL 6  Creatinine 4.69 - 6.29 mg/dL 5.28(U)  Sodium 132 - 440 mmol/L 137  Potassium 3.5 - 5.1 mmol/L  3.4(L)  Chloride 98 - 111 mmol/L 104  CO2 22 - 32 mmol/L 26  Calcium 8.9 - 10.3 mg/dL 1.0(U)  Total Protein 6.5 - 8.1 g/dL 7.0  Total Bilirubin 0.3 - 1.2 mg/dL 0.4  Alkaline Phos 38 - 126 U/L 69  AST 15 - 41 U/L 27  ALT 0 - 44 U/L 10   CBC Latest Ref Rng & Units 08/11/2020  WBC 4.0 - 10.5 K/uL 10.0  Hemoglobin 12.0 - 15.0 g/dL 10.3(L)  Hematocrit 36.0 - 46.0 % 31.8(L)  Platelets 150 - 400 K/uL 305    RADIOGRAPHIC STUDIES: I have personally reviewed the radiological images as listed and agreed with the findings in the report. NM PET Image Initial (PI) Skull Base To Thigh  Result Date: 07/20/2020 CLINICAL DATA:  Initial treatment strategy for breast cancer. EXAM: NUCLEAR MEDICINE PET SKULL BASE TO THIGH TECHNIQUE: 9.9 mCi F-18 FDG was injected intravenously. Full-ring PET imaging was performed from the skull base to thigh after the radiotracer. CT data was obtained and used for attenuation correction and anatomic localization. Fasting blood glucose: 105 mg/dl COMPARISON:  MR total spine 06/25/2020, CT chest 06/15/2020. FINDINGS: Mediastinal blood pool activity: SUV max 2.1 Liver activity: SUV max NA NECK: No abnormal hypermetabolism. Incidental CT findings: None. CHEST: There are hypermetabolic bilateral mediastinal and hilar lymph nodes. Index high right paratracheal lymph node measures 11 mm (3/75) with an SUV max of 3.4. Hypermetabolic bilateral axillary lymph nodes, right greater than left. Index right axillary lymph node measures 11 mm (3/92) with an SUV max of 7.5. Hypermetabolic prepericardiac adenopathy measures up to 10 mm an SUV max of 4.3. Hypermetabolic thickening of the right hemidiaphragm is seen along the ventral margin of the liver (3/133), with an SUV max of 4.1. Hypermetabolic right breast mass measures approximately 4.2 x 4.9 cm with an SUV max up to 13.1. Additional hypermetabolic subcutaneous nodularity in the upper right ventral chest wall with overlying skin thickening.  Focal hypermetabolism posterior to the mid descending thoracic aorta corresponds to 1.8 cm pleural nodule, with an SUV max of 6.0. Incidental CT findings: Left-sided Port-A-Cath terminates in the right atrium. Heart is enlarged. No pericardial effusion. Large partially loculated right pleural effusion. Tiny left  pleural effusion. Nodularity is seen along the left major fissure. Nodules are too small for PET resolution. ABDOMEN/PELVIS: No definitive hypermetabolism within the liver, adrenal glands or spleen. Focal hypermetabolism in the pancreatic tail, SUV 3.5, corresponding to an 11 mm low-attenuation lesion on CT (3/145). No hypermetabolic lymph nodes. Incidental CT findings: Liver, gallbladder, adrenal glands, kidneys, spleen, pancreas, stomach and bowel are otherwise unremarkable. Small pelvic free fluid. SKELETON: Hypermetabolic lesions are seen throughout the visualized osseous structures. Index lesion in the right sacrum has an SUV max of 6.2. Incidental CT findings: Predominately sclerotic lesions are seen throughout the visualized osseous structures. IMPRESSION: 1. Metastatic breast cancer involving the right breast/chest wall, bilateral mediastinal, hilar and axillary lymph nodes, left hemithorax and bones. 2. Hypermetabolic lesion in the pancreatic tail may represent a metastasis or primary lesion. Electronically Signed   By: Lorin Picket M.D.   On: 07/20/2020 09:33   US Venous Img Lower Bilateral  Result Date: 07/15/2020 CLINICAL DATA:  54 year old female with a history of bilateral lower extremity swelling EXAM: BILATERAL LOWER EXTREMITY VENOUS DOPPLER ULTRASOUND TECHNIQUE: Gray-scale sonography with graded compression, as well as color Doppler and duplex ultrasound were performed to evaluate the lower extremity deep venous systems from the level of the common femoral vein and including the common femoral, femoral, profunda femoral, popliteal and calf veins including the posterior tibial,  peroneal and gastrocnemius veins when visible. The superficial great saphenous vein was also interrogated. Spectral Doppler was utilized to evaluate flow at rest and with distal augmentation maneuvers in the common femoral, femoral and popliteal veins. COMPARISON:  None. FINDINGS: RIGHT LOWER EXTREMITY Common Femoral Vein: No evidence of thrombus. Normal compressibility, respiratory phasicity and response to augmentation. Saphenofemoral Junction: No evidence of thrombus. Normal compressibility and flow on color Doppler imaging. Profunda Femoral Vein: No evidence of thrombus. Normal compressibility and flow on color Doppler imaging. Femoral Vein: No evidence of thrombus. Normal compressibility, respiratory phasicity and response to augmentation. Popliteal Vein: No evidence of thrombus. Normal compressibility, respiratory phasicity and response to augmentation. Calf Veins: No evidence of thrombus. Normal compressibility and flow on color Doppler imaging. Superficial Great Saphenous Vein: No evidence of thrombus. Normal compressibility and flow on color Doppler imaging. Other Findings:  None. LEFT LOWER EXTREMITY Common Femoral Vein: No evidence of thrombus. Normal compressibility, respiratory phasicity and response to augmentation. Saphenofemoral Junction: No evidence of thrombus. Normal compressibility and flow on color Doppler imaging. Profunda Femoral Vein: No evidence of thrombus. Normal compressibility and flow on color Doppler imaging. Femoral Vein: No evidence of thrombus. Normal compressibility, respiratory phasicity and response to augmentation. Popliteal Vein: No evidence of thrombus. Normal compressibility, respiratory phasicity and response to augmentation. Calf Veins: No evidence of thrombus. Normal compressibility and flow on color Doppler imaging. Superficial Great Saphenous Vein: No evidence of thrombus. Normal compressibility and flow on color Doppler imaging. Other Findings:  None. IMPRESSION:  Sonographic survey of the bilateral lower extremities negative for DVT Signed, Dulcy Fanny. Dellia Nims, RPVI [facility] Electronically Signed   By: Corrie Mckusick D.O.   On: 07/15/2020 14:30     Assessment and plan Patient is a 54 y.o. female presents for follow-up of metastatic triple negative breast cancer 1. Metastatic breast cancer ([place])   2. Pancreatic lesion   3. Encounter for antineoplastic chemotherapy   4. Bone metastasis ([place])   5. Vitamin D deficiency    #Stage IV metastatic triple negative breast cancer.  On palliative chemotherapy. Labs are reviewed and  discussed with patient. Counts acceptable to proceed with weekly Taxol treatment today. Overall she tolerates well  #Pancreatic lesion, metastasis versus inflammatory process versus primary pancreatic lesion.  CA 19-9 is negative.  We will follow up this nodule on image #Tachycardia, this is has been a chronic issue for her.  Continue monitor. #Malignant pleural effusion on the right large loculated right pleural effusion on PET scan.  Breathing status is stable.  She does have shortness of breath with exertion I will touch base with Dr. Genevive Bi to see if any intervention is needed.  #Bone metastasis, continue monthly Zometa.  She will receive Zometa today. Continue calcium and vitamin D supplementation.  #Vitamin D deficiency, finished course of vitamin D 50,000 weekly x6..  # Iron deficiency anemia s/p IV venofer x 2 Continue oral iron supplementation ferrous sulfate 325 mg twice daily.  Hemoglobin stable. #Leg swelling, no DVT.  Recommend leg elevation, discussed about compression stocking.  #GERD, continue Protonix 40 mg daily.  To follow-up with palliative care service patient will follow up in 2 weeks   Earlie Server, MD, PhD Hematology Oncology [facility] Pager- [phone] 08/11/2020

## 2020-08-16 ENCOUNTER — Telehealth: Payer: Self-pay

## 2020-08-16 NOTE — Telephone Encounter (Signed)
Message received from triage:  She got COVID tested today and and does not have results back yet because she started coughing Sunday and her daughter is positive for COVID. She has treatment Thursday. and she is asking if she should reschedule her appointment because she will not get her results back until late Thursday

## 2020-08-16 NOTE — Telephone Encounter (Signed)
Done.. Appt R/S as requested

## 2020-08-16 NOTE — Telephone Encounter (Signed)
MD would like to postpone tx 1 week.  Patient will update [place] with results and if positive will inform NP for possible COVID clinic.

## 2020-08-17 ENCOUNTER — Telehealth: Payer: Self-pay | Admitting: Oncology

## 2020-08-17 NOTE — Telephone Encounter (Signed)
Patient called to reschedule appointment due to Covid exposure. Pt stated her results will not be in until tomorrow, and requested a call back [phone].

## 2020-08-17 NOTE — Telephone Encounter (Signed)
Patient notified

## 2020-08-17 NOTE — Telephone Encounter (Signed)
Ok to stop both for now. Will repeat levels in the future

## 2020-08-17 NOTE — Telephone Encounter (Signed)
Dr. Cathie Hoops, patient is going to take the last weekly Vitamin D tablet next Monday and is wanting to know if she will start the daily Vitamin D after that, if so needs a refill.  Also needs a refill on Folic Acid if she is to continue this, takes the last one today.

## 2020-08-17 NOTE — Telephone Encounter (Signed)
Pt called yesterday as well and it looks like appts were rescheduled. Jessica Willis, were you able to contact pt to give her new appts?

## 2020-08-17 NOTE — Telephone Encounter (Signed)
I spoke with patient today and let her know about appt details.

## 2020-08-17 NOTE — Telephone Encounter (Signed)
I just spoke to patient to let her know of new appts scheduled.

## 2020-08-18 ENCOUNTER — Inpatient Hospital Stay: Payer: Medicaid Other

## 2020-08-18 ENCOUNTER — Inpatient Hospital Stay: Payer: Medicaid Other | Admitting: Oncology

## 2020-08-19 ENCOUNTER — Encounter: Payer: Self-pay | Admitting: Oncology

## 2020-08-22 ENCOUNTER — Inpatient Hospital Stay: Payer: Medicaid Other | Attending: Nurse Practitioner | Admitting: Nurse Practitioner

## 2020-08-22 ENCOUNTER — Other Ambulatory Visit: Payer: Self-pay

## 2020-08-22 ENCOUNTER — Other Ambulatory Visit: Payer: Self-pay | Admitting: Physician Assistant

## 2020-08-22 ENCOUNTER — Telehealth: Payer: Self-pay | Admitting: Physician Assistant

## 2020-08-22 ENCOUNTER — Telehealth: Payer: Self-pay | Admitting: *Deleted

## 2020-08-22 ENCOUNTER — Ambulatory Visit: Payer: Self-pay | Admitting: *Deleted

## 2020-08-22 DIAGNOSIS — C50911 Malignant neoplasm of unspecified site of right female breast: Secondary | ICD-10-CM | POA: Diagnosis not present

## 2020-08-22 DIAGNOSIS — J910 Malignant pleural effusion: Secondary | ICD-10-CM | POA: Insufficient documentation

## 2020-08-22 DIAGNOSIS — D509 Iron deficiency anemia, unspecified: Secondary | ICD-10-CM | POA: Insufficient documentation

## 2020-08-22 DIAGNOSIS — J154 Pneumonia due to other streptococci: Secondary | ICD-10-CM | POA: Insufficient documentation

## 2020-08-22 DIAGNOSIS — C7951 Secondary malignant neoplasm of bone: Secondary | ICD-10-CM | POA: Insufficient documentation

## 2020-08-22 DIAGNOSIS — Z803 Family history of malignant neoplasm of breast: Secondary | ICD-10-CM | POA: Insufficient documentation

## 2020-08-22 DIAGNOSIS — M7989 Other specified soft tissue disorders: Secondary | ICD-10-CM | POA: Insufficient documentation

## 2020-08-22 DIAGNOSIS — U071 COVID-19: Secondary | ICD-10-CM | POA: Diagnosis not present

## 2020-08-22 DIAGNOSIS — Z5111 Encounter for antineoplastic chemotherapy: Secondary | ICD-10-CM | POA: Insufficient documentation

## 2020-08-22 DIAGNOSIS — C50919 Malignant neoplasm of unspecified site of unspecified female breast: Secondary | ICD-10-CM

## 2020-08-22 DIAGNOSIS — R000 Tachycardia, unspecified: Secondary | ICD-10-CM | POA: Insufficient documentation

## 2020-08-22 DIAGNOSIS — Z171 Estrogen receptor negative status [ER-]: Secondary | ICD-10-CM | POA: Insufficient documentation

## 2020-08-22 DIAGNOSIS — Z8616 Personal history of COVID-19: Secondary | ICD-10-CM | POA: Insufficient documentation

## 2020-08-22 DIAGNOSIS — K219 Gastro-esophageal reflux disease without esophagitis: Secondary | ICD-10-CM | POA: Insufficient documentation

## 2020-08-22 MED ORDER — HYDROCOD POLST-CPM POLST ER 10-8 MG/5ML PO SUER: 5.0000 mL | Freq: Two times a day (BID) | ORAL | 0 refills | Status: DC | PRN

## 2020-08-22 NOTE — Telephone Encounter (Signed)
Do you know if she has contacted her PCP?

## 2020-08-22 NOTE — Telephone Encounter (Signed)
Jessica Willis, please postpone treatment for 2 weeks and contact pt with new appt details. Thanks

## 2020-08-22 NOTE — Telephone Encounter (Signed)
Done...  Pt appts has been postpone treatment for 2 weeks  as requested A detailed message was left on pts VM making her aware of the sched 09/08/20 appt

## 2020-08-22 NOTE — Progress Notes (Signed)
Virtual Visit Progress Note  Sympt[facility]  [phone] [phone]  I connected with Jessica Willis on 08/22/20 at  2:30 PM EST by video enabled telemedicine visit and verified that I am speaking with the correct person using two identifiers.   I discussed the limitations, risks, security and privacy concerns of performing an evaluation and management service by telemedicine and the availability of in-person appointments. I also discussed with the patient that there may be a patient responsible charge related to this service. The patient expressed understanding and agreed to proceed.   Other persons participating in the visit and their role in the encounter: none  Patient's location: home Provider's location: clinic  Chief Complaint: Covid 19 Infection    Patient Care Team: Ken Caryl as PCP - General Rico Junker, RN as Registered Nurse   Name of the patient: Jessica Willis  [id number]  1966/07/12   Date of visit: 08/22/20  Diagnosis- Metastatic Breast cancer  Chief complaint/ Reason for visit- Covid Infection  Heme/Onc history:  Oncology History  Metastatic breast cancer ([place])  06/21/2020 Initial Diagnosis   Metastatic breast cancer ([place])   06/28/2020 Cancer Staging   Staging form: Breast, AJCC 8th Edition - Clinical stage from 06/28/2020: Stage IV (cT4c, cM1, G3, ER-, PR-, HER2-) - Signed by Earlie Server, MD on 08/11/2020   06/29/2020 -  Chemotherapy   The patient had PACLitaxel (TAXOL) 162 mg in sodium chloride 0.9 % 250 mL chemo infusion (</= 30m/m2), 80 mg/m2 = 162 mg, Intravenous,  Once, 2 of 4 cycles Administration: 162 mg (06/29/2020), 162 mg (07/06/2020), 162 mg (07/22/2020), 162 mg (07/29/2020), 162 mg (07/13/2020), 162 mg (08/11/2020)  for chemotherapy treatment.     Genetic Testing   Negative genetic testing. No pathogenic variants identified on the [facility]  Common Hereditary Cancers Panel. The report date is 07/25/2020.  The Common Hereditary Cancers Panel offered by [facility] includes sequencing and/or deletion duplication testing of the following 48 genes: APC, ATM, AXIN2, BARD1, BMPR1A, BRCA1, BRCA2, BRIP1, CDH1, CDKN2A (p14ARF), CDKN2A (p16INK4a), CKD4, CHEK2, CTNNA1, DICER1, EPCAM (Deletion/duplication testing only), GREM1 (promoter region deletion/duplication testing only), KIT, MEN1, MLH1, MSH2, MSH3, MSH6, MUTYH, NBN, NF1, NHTL1, PALB2, PDGFRA, PMS2, POLD1, POLE, PTEN, RAD50, RAD51C, RAD51D, RNF43, SDHB, SDHC, SDHD, SMAD4, SMARCA4. STK11, TP53, TSC1, TSC2, and VHL.  The following genes were evaluated for sequence changes only: SDHA and HOXB13 c.251G>A variant only.     Interval history- Jessica Willis 55year old female diagnosed with metastatic triple negative breast cancer currently receiving Taxol chemotherapy and Zometa for bone metastases, presents to symptom management clinic for complaints of COVID-19 infection.  Symptoms started on 08/16/2020.  She was tested on that same date and tested positive.  Symptoms include cough, congestion, rhinorrhea, malaise, loss of taste & smell. Has tried cough drops with some improvement. Coughing interferes with sleep. Unsure what she could take due to possible interactions with her chemotherapy. She was not vaccinated against covid 19. No shortness of breath or chest pain. No GI symptoms. Patient does have transportation issues. No pulse oximeter.   Review of systems- Review of Systems  Constitutional: Positive for chills and malaise/fatigue. Negative for fever and weight loss.  HENT: Positive for congestion. Negative for hearing loss, nosebleeds, sore throat and tinnitus.   Eyes: Negative for blurred vision and double vision.  Respiratory: Positive for cough. Negative for hemoptysis, sputum production, shortness of breath and wheezing.   Cardiovascular: Negative for chest pain,  palpitations and leg  swelling.  Gastrointestinal: Negative for abdominal pain, blood in stool, constipation, diarrhea, melena, nausea and vomiting.  Genitourinary: Negative for dysuria and urgency.  Musculoskeletal: Negative for back pain, falls, joint pain and myalgias.  Skin: Negative for itching and rash.  Neurological: Negative for dizziness, tingling, sensory change, loss of consciousness, weakness and headaches.  Endo/Heme/Allergies: Negative for environmental allergies. Does not bruise/bleed easily.  Psychiatric/Behavioral: Negative for depression. The patient is not nervous/anxious and does not have insomnia.      Current treatment- taxol  No Known Allergies  Past Medical History:  Diagnosis Date  . Anemia   . Family history of breast cancer   . Patient denies medical problems     Past Surgical History:  Procedure Laterality Date  . BREAST BIOPSY  06/28/2020   Procedure: BREAST BIOPSY;  Surgeon: Benjamine Sprague, DO;  Location: [facility] ORS;  Service: General;;  . PORTACATH PLACEMENT N/A 06/28/2020   Procedure: INSERTION PORT-A-CATH;  Surgeon: Benjamine Sprague, DO;  Location: [facility] ORS;  Service: General;  Laterality: N/A;  . TUBAL LIGATION    . VIDEO ASSISTED THORACOSCOPY (VATS)/THOROCOTOMY Right 06/23/2020   Procedure: ATTEMPTED VIDEO ASSISTED THORACOSCOPY (VATS);  Surgeon: Nestor Lewandowsky, MD;  Location: [facility] ORS;  Service: General;  Laterality: Right;    Social History   Socioeconomic History  . Marital status: Single    Spouse name: Not on file  . Number of children: Not on file  . Years of education: Not on file  . Highest education level: Not on file  Occupational History  . Not on file  Tobacco Use  . Smoking status: Never Smoker  . Smokeless tobacco: Never Used  Substance and Sexual Activity  . Alcohol use: Not Currently    Alcohol/week: 1.0 standard drink    Types: 1 Cans of beer per week  . Drug use: No  . Sexual activity: Not on file  Other Topics Concern  . Not on file  Social  History Narrative  . Not on file   Social Determinants of Health   Financial Resource Strain: Not on file  Food Insecurity: Not on file  Transportation Needs: Not on file  Physical Activity: Not on file  Stress: Not on file  Social Connections: Not on file  Intimate Partner Violence: Not on file    Family History  Problem Relation Age of Onset  . Diabetes Other   . Hypertension Other   . Diabetes Maternal Aunt   . Breast cancer Cousin        dx 19s  . Breast cancer Cousin        dx 25s     Current Outpatient Medications:  .  cholecalciferol (VITAMIN D3) 25 MCG (1000 UNIT) tablet, TAKE 1 TABLET BY MOUTH EVERY DAY (Patient not taking: No sig reported), Disp: 30 tablet, Rfl: 0 .  calcium carbonate (CALCIUM 600) 600 MG TABS tablet, Take 2 tablets (1,200 mg total) by mouth daily with breakfast., Disp: 60 tablet, Rfl: 1 .  ferrous sulfate 325 (65 FE) MG EC tablet, Take 1 tablet (325 mg total) by mouth 2 (two) times daily with a meal., Disp: 60 tablet, Rfl: 1 .  folic acid (FOLVITE) 1 MG tablet, Take 1 tablet (1 mg total) by mouth daily., Disp: 60 tablet, Rfl: 0 .  lidocaine-prilocaine (EMLA) cream, Apply 1 application topically as needed. Place a small amount of cream to port site prior to each chemo treatment, 1-2 hours before treatment., Disp: 30 g, Rfl: 0 .  pantoprazole (PROTONIX) 40 MG tablet, TAKE 1 TABLET BY MOUTH EVERY DAY, Disp: 30 tablet, Rfl: 0  Physical exam: Exam limited due to telemedicine There were no vitals filed for this visit. Physical Exam Constitutional:      General: She is not in acute distress.    Appearance: She is ill-appearing.  HENT:     Nose: Congestion present.  Pulmonary:     Effort: Pulmonary effort is normal. No respiratory distress.     Comments: Dry cough heard. Speaking in full sentences. Not obviously short of breath or labored.  Neurological:     Mental Status: She is alert and oriented to person, place, and time.  Psychiatric:         Mood and Affect: Mood normal.        Behavior: Behavior normal.      CMP Latest Ref Rng & Units 08/11/2020  Glucose 70 - 99 mg/dL 145(H)  BUN 6 - 20 mg/dL 6  Creatinine 0.44 - 1.00 mg/dL 0.41(L)  Sodium 135 - 145 mmol/L 137  Potassium 3.5 - 5.1 mmol/L 3.4(L)  Chloride 98 - 111 mmol/L 104  CO2 22 - 32 mmol/L 26  Calcium 8.9 - 10.3 mg/dL 8.5(L)  Total Protein 6.5 - 8.1 g/dL 7.0  Total Bilirubin 0.3 - 1.2 mg/dL 0.4  Alkaline Phos 38 - 126 U/L 69  AST 15 - 41 U/L 27  ALT 0 - 44 U/L 10   CBC Latest Ref Rng & Units 08/11/2020  WBC 4.0 - 10.5 K/uL 10.0  Hemoglobin 12.0 - 15.0 g/dL 10.3(L)  Hematocrit 36.0 - 46.0 % 31.8(L)  Platelets 150 - 400 K/uL 305    No images are attached to the encounter.  No results found.  Assessment and plan- Patient is a 55 y.o. female diagnosed with metastatic triple negative breast cancer currently receiving Taxol chemotherapy and Zometa for bone metastases, who presents to symptom management clinic for complaints of COVID-19 infection.  Patient is unvaccinated.  Appears appropriate for outpatient monoclonal antibodies and possibly antivirals.  By her account, symptom onset 6 days ago. Will place order to Covid treatment team for evaluation and treatment if she qualifies. Advised of home isolation precautions including 21 days from symptom onset given her immunocompromised status. Cancer center appointments moved out.  Follow up if symptoms don't improve or worsen in the interim. ER precautions provided.    Visit Diagnosis 1. COVID-19 virus infection     Patient expressed understanding and was in agreement with this plan. She also understands that She can call clinic at any time with any questions, concerns, or complaints.   I discussed the assessment and treatment plan with the patient. The patient was provided an opportunity to ask questions and all were answered. The patient agreed with the plan and demonstrated an understanding of the instructions.    The patient was advised to call back or seek an in-person evaluation if the symptoms worsen or if the condition fails to improve as anticipated.   I provided 25 minutes of face-to-face video visit time dedicated to the care of this patient on the date of this encounter to include pre-visit review of previous progress notes from Dr. Tasia Catchings, treatment summary, face-to-face time with the patient, and post visit ordering of testing/documentation.   Thank you for allowing me to participate in the care of this very pleasant patient.   Beckey Rutter, DNP, AGNP-C [facility]

## 2020-08-22 NOTE — Progress Notes (Signed)
I connected by phone with Jessica Willis on 08/22/2020 at 3:26 PM to discuss the potential use of a new treatment for mild to moderate COVID-19 viral infection in non-hospitalized patients.  This patient is a 55 y.o. female that meets the FDA criteria for Emergency Use Authorization of COVID monoclonal antibody sotrovimab.  Has a (+) direct SARS-CoV-2 viral test result  Has mild or moderate COVID-19   Is NOT hospitalized due to COVID-19  Is within 10 days of symptom onset  Has at least one of the high risk factor(s) for progression to severe COVID-19 and/or hospitalization as defined in EUA.  Specific high risk criteria : Immunosuppressive Disease or Treatment and Other high risk medical condition per CDC:  unvaccintaed, high SVI   I have spoken and communicated the following to the patient or parent/caregiver regarding COVID monoclonal antibody treatment:  1. FDA has authorized the emergency use for the treatment of mild to moderate COVID-19 in adults and pediatric patients with positive results of direct SARS-CoV-2 viral testing who are 47 years of age and older weighing at least 40 kg, and who are at high risk for progressing to severe COVID-19 and/or hospitalization.  2. The significant known and potential risks and benefits of COVID monoclonal antibody, and the extent to which such potential risks and benefits are unknown.  3. Information on available alternative treatments and the risks and benefits of those alternatives, including clinical trials.  4. Patients treated with COVID monoclonal antibody should continue to self-isolate and use infection control measures (e.g., wear mask, isolate, social distance, avoid sharing personal items, clean and disinfect "high touch" surfaces, and frequent handwashing) according to CDC guidelines.   5. The patient or parent/caregiver has the option to accept or refuse COVID monoclonal antibody treatment.  After reviewing this information  with the patient, the patient has agreed to receive one of the available covid 19 monoclonal antibodies and will be provided an appropriate fact sheet prior to infusion.   Pt is unvaccinated. Sx onset 1/5. Set up for infusion on 1/11 @ 10:30am.  Angelena Form, PA-C 08/22/2020 3:26 PM

## 2020-08-22 NOTE — Telephone Encounter (Signed)
Patient has been scheduled to see Beckey Rutter, NP in [facility] via virtual visit today.

## 2020-08-22 NOTE — Telephone Encounter (Signed)
Patient also sent Mychart message stating that she tested positive for COVID on Thursday 1/6. Per MD, appts will need to be postponed for 2 weeks since she is having symptoms.

## 2020-08-22 NOTE — Telephone Encounter (Signed)
Patient called reporting that she tested positive for COVID and has no taste and a bad cough and wants to know what she can take for it. Please advise

## 2020-08-22 NOTE — Telephone Encounter (Signed)
Called to discuss with Jessica Willis about Covid symptoms and the use of sotrovimab, remdisivir or oral therapies for those with mild to moderate Covid symptoms and at a high risk of hospitalization.     Pt does not qualify as pt's symptoms first presented > 7 days prior to timing of infusion. Symptoms tier reviewed as well as criteria for ending isolation. Preventative practices reviewed. Patient verbalized understanding  Spoke to daughter who said pt is actually en route to [facility] ER.    Patient Active Problem List   Diagnosis Date Noted  . Pneumonia due to Streptococcus pyogenes ([place]) 08/22/2020  . Genetic testing 07/27/2020  . Pancreatic lesion 07/22/2020  . Swelling of lower leg 07/13/2020  . Encounter for antineoplastic chemotherapy 07/04/2020  . Complication of chemotherapy 07/04/2020  . Family history of breast cancer   . Port-A-Cath in place   . Bone metastasis ([place])   . Hypocalcemia   . Folate deficiency   . Gastroesophageal reflux disease without esophagitis   . Weakness of right arm   . Palliative care encounter   . Iron deficiency anemia   . Anemia, chronic disease   . Tachycardia   . Metastatic breast cancer ([place])   . Malignant pleural effusion   . Chest tube in place   . Goals of care, counseling/discussion   . Hemothorax on right 06/16/2020  . Acute respiratory failure with hypoxia ([place])   . Breast mass, right 06/15/2020  . Mastitis 06/15/2020  . Pleural effusion 06/15/2020  . Intestinal obstruction ([place])   . Leukocytosis   . Hypokalemia 06/24/2015  . Sepsis ([place]) 06/23/2015  . CAP (community acquired pneumonia) 06/23/2015  . Partial small bowel obstruction ([place]) 06/23/2015    Angelena Form PA-C

## 2020-08-22 NOTE — Telephone Encounter (Signed)
Pt called in c/o having a cough from covid.  She was tested positive on 08/18/2020.   She is getting chemotherapy for breast cancer.   When she called the [facility] they referred her to [place] , [facility] [place] that we could tell her what she could and could not take with her chemo treatments.  After talking with her a few minutes she said her PCP doctor is at [facility] in [place] and she has not seen her in a year.   "She won't know what to tell me regarding my chemo and what I can take for my cough".  I got the number from the [facility] from her [phone] and let her know I would call them and see what I could find out for her.  She mentioned,   "I don't know what else to do".   I let her know I was going to do my best to get an answer for her.   She was agreeable to me calling the [facility] and calling her back or have the [facility] call her back at [phone].  I called the [place] at [facility] and was talking with Hassan Rowan.   She was giving me information regarding a post covid care unit when the internet disconnected our call.  I rebooted my computer and called Hassan Rowan back at the [facility].   She gave me the information for the [facility] clinics.  I called pt back and let her know the information about the post covid care center at [phone].  She said she already called that number and they are the one that gave her the triage nurse number ([facility] [place]).  She is going to call the [facility] back and let them know she does not want to come in she just needs Dr. Tasia Catchings to let her know what kind of cough medicine she can take with her chemotherapy.   "I know I can't just take anything".    I let her know I was so sorry about the run around she has gotten.   "I thank you for trying to help me".  I let her know when she calls the cancer center back to let them know she does not want to come in she just needs  help with what kind of cough medicine she should take.  No triage needed for this call.      Reason for Disposition . HIGH RISK for severe COVID complications (e.g., age > 15 years, obesity with BMI > 25, pregnant, chronic lung disease or other chronic medical condition)  (Exception: Already seen by PCP and no new or worsening symptoms.)    Getting chemo for breast cancer.   covid positive.  Can't get rid of the cough.   What can she take with chemo?  Answer Assessment - Initial Assessment Questions 1. COVID-19 DIAGNOSIS: "Who made your COVID-19 diagnosis?" "Was it confirmed by a positive lab test?" If not diagnosed by a HCP, ask "Are there lots of cases (community spread) where you live?" Note: See public health department website, if unsure.     Positive covid test.   It was done at.    I got my test result Thur.   She has cancer.   She is c/o coughing.  I called my doctor this morning and they said they can't see anyone with covid.     I haven't seen my PCP in about a yr.  2. COVID-19 EXPOSURE: "Was there any known exposure to COVID before the symptoms began?" CDC Definition of close contact: within 6 feet (2 meters) for a total of 15 minutes or more over a 24-hour period.      She is getting chemo for breast cancer. 3. ONSET: "When did the COVID-19 symptoms start?"      *No Answer* 4. WORST SYMPTOM: "What is your worst symptom?" (e.g., cough, fever, shortness of breath, muscle aches)     Coughing  5. COUGH: "Do you have a cough?" If Yes, ask: "How bad is the cough?"       Yes 6. FEVER: "Do you have a fever?" If Yes, ask: "What is your temperature, how was it measured, and when did it start?"     *No Answer* 7. RESPIRATORY STATUS: "Describe your breathing?" (e.g., shortness of breath, wheezing, unable to speak)      *No Answer* 8. BETTER-SAME-WORSE: "Are you getting better, staying the same or getting worse compared to yesterday?"  If getting worse, ask, "In what way?"     *No  Answer* 9. HIGH RISK DISEASE: "Do you have any chronic medical problems?" (e.g., asthma, heart or lung disease, weak immune system, obesity, etc.)     *No Answer* 10. VACCINE: "Have you gotten the COVID-19 vaccine?" If Yes ask: "Which one, how many shots, when did you get it?"       *No Answer* 11. PREGNANCY: "Is there any chance you are pregnant?" "When was your last menstrual period?"       *No Answer* 12. OTHER SYMPTOMS: "Do you have any other symptoms?"  (e.g., chills, fatigue, headache, loss of smell or taste, muscle pain, sore throat; new loss of smell or taste especially support the diagnosis of COVID-19)       *No Answer*  Protocols used: CORONAVIRUS (COVID-19) DIAGNOSED OR SUSPECTED-A-AH

## 2020-08-22 NOTE — Telephone Encounter (Addendum)
Patient just wants to know what medication she can take for cough, since she is on active treatment, does not want to be evaluated (please see phone note from Marijo Conception, RN)

## 2020-08-22 NOTE — Telephone Encounter (Signed)
Spoke with patient she will call [facility] or her PCP

## 2020-08-22 NOTE — Telephone Encounter (Signed)
No, do you want me to ask her to contact her PCP?

## 2020-08-25 ENCOUNTER — Inpatient Hospital Stay: Payer: Medicaid Other

## 2020-08-25 ENCOUNTER — Inpatient Hospital Stay: Payer: Medicaid Other | Admitting: Oncology

## 2020-08-31 ENCOUNTER — Other Ambulatory Visit: Payer: Self-pay | Admitting: Oncology

## 2020-09-01 ENCOUNTER — Other Ambulatory Visit: Payer: Self-pay | Admitting: *Deleted

## 2020-09-01 MED ORDER — CALCIUM CARBONATE 600 MG PO TABS: 1200.0000 mg | ORAL_TABLET | Freq: Every day | ORAL | 1 refills | Status: DC

## 2020-09-08 ENCOUNTER — Inpatient Hospital Stay: Payer: Medicaid Other

## 2020-09-08 ENCOUNTER — Other Ambulatory Visit: Payer: Self-pay

## 2020-09-08 ENCOUNTER — Encounter: Payer: Self-pay | Admitting: Oncology

## 2020-09-08 ENCOUNTER — Inpatient Hospital Stay (HOSPITAL_BASED_OUTPATIENT_CLINIC_OR_DEPARTMENT_OTHER): Payer: Medicaid Other | Admitting: Oncology

## 2020-09-08 ENCOUNTER — Other Ambulatory Visit: Payer: Self-pay | Admitting: Oncology

## 2020-09-08 VITALS — BP 154/87 | HR 114 | Temp 98.0°F | Resp 16

## 2020-09-08 VITALS — BP 158/92 | HR 112 | Temp 99.3°F | Resp 18 | Wt 181.7 lb

## 2020-09-08 DIAGNOSIS — C7951 Secondary malignant neoplasm of bone: Secondary | ICD-10-CM

## 2020-09-08 DIAGNOSIS — C50919 Malignant neoplasm of unspecified site of unspecified female breast: Secondary | ICD-10-CM | POA: Diagnosis not present

## 2020-09-08 DIAGNOSIS — Z171 Estrogen receptor negative status [ER-]: Secondary | ICD-10-CM | POA: Diagnosis not present

## 2020-09-08 DIAGNOSIS — M7989 Other specified soft tissue disorders: Secondary | ICD-10-CM | POA: Diagnosis not present

## 2020-09-08 DIAGNOSIS — Z8616 Personal history of COVID-19: Secondary | ICD-10-CM | POA: Diagnosis not present

## 2020-09-08 DIAGNOSIS — K219 Gastro-esophageal reflux disease without esophagitis: Secondary | ICD-10-CM | POA: Diagnosis not present

## 2020-09-08 DIAGNOSIS — Z5111 Encounter for antineoplastic chemotherapy: Secondary | ICD-10-CM

## 2020-09-08 DIAGNOSIS — C50911 Malignant neoplasm of unspecified site of right female breast: Secondary | ICD-10-CM | POA: Diagnosis present

## 2020-09-08 DIAGNOSIS — J910 Malignant pleural effusion: Secondary | ICD-10-CM | POA: Diagnosis not present

## 2020-09-08 DIAGNOSIS — K869 Disease of pancreas, unspecified: Secondary | ICD-10-CM

## 2020-09-08 DIAGNOSIS — Z803 Family history of malignant neoplasm of breast: Secondary | ICD-10-CM | POA: Diagnosis not present

## 2020-09-08 DIAGNOSIS — R000 Tachycardia, unspecified: Secondary | ICD-10-CM | POA: Diagnosis not present

## 2020-09-08 DIAGNOSIS — D509 Iron deficiency anemia, unspecified: Secondary | ICD-10-CM | POA: Diagnosis not present

## 2020-09-08 LAB — CBC WITH DIFFERENTIAL/PLATELET
Abs Immature Granulocytes: 0.02 10*3/uL (ref 0.00–0.07)
Basophils Absolute: 0 10*3/uL (ref 0.0–0.1)
Basophils Relative: 0 %
Eosinophils Absolute: 0.1 10*3/uL (ref 0.0–0.5)
Eosinophils Relative: 1 %
HCT: 33 % — ABNORMAL LOW (ref 36.0–46.0)
Hemoglobin: 10.4 g/dL — ABNORMAL LOW (ref 12.0–15.0)
Immature Granulocytes: 0 %
Lymphocytes Relative: 13 %
Lymphs Abs: 1.3 10*3/uL (ref 0.7–4.0)
MCH: 25.4 pg — ABNORMAL LOW (ref 26.0–34.0)
MCHC: 31.5 g/dL (ref 30.0–36.0)
MCV: 80.5 fL (ref 80.0–100.0)
Monocytes Absolute: 0.6 10*3/uL (ref 0.1–1.0)
Monocytes Relative: 6 %
Neutro Abs: 8 10*3/uL — ABNORMAL HIGH (ref 1.7–7.7)
Neutrophils Relative %: 80 %
Platelets: 330 10*3/uL (ref 150–400)
RBC: 4.1 MIL/uL (ref 3.87–5.11)
RDW: 16 % — ABNORMAL HIGH (ref 11.5–15.5)
WBC: 10 10*3/uL (ref 4.0–10.5)
nRBC: 0 % (ref 0.0–0.2)

## 2020-09-08 LAB — COMPREHENSIVE METABOLIC PANEL
ALT: 9 U/L (ref 0–44)
AST: 32 U/L (ref 15–41)
Albumin: 3.4 g/dL — ABNORMAL LOW (ref 3.5–5.0)
Alkaline Phosphatase: 78 U/L (ref 38–126)
Anion gap: 9 (ref 5–15)
BUN: 8 mg/dL (ref 6–20)
CO2: 24 mmol/L (ref 22–32)
Calcium: 8.4 mg/dL — ABNORMAL LOW (ref 8.9–10.3)
Chloride: 104 mmol/L (ref 98–111)
Creatinine, Ser: 0.5 mg/dL (ref 0.44–1.00)
GFR, Estimated: >60 mL/min (ref >60)
Glucose, Bld: 126 mg/dL — ABNORMAL HIGH (ref 70–99)
Potassium: 3.5 mmol/L (ref 3.5–5.1)
Sodium: 137 mmol/L (ref 135–145)
Total Bilirubin: 0.3 mg/dL (ref 0.3–1.2)
Total Protein: 7.4 g/dL (ref 6.5–8.1)

## 2020-09-08 MED ORDER — SODIUM CHLORIDE 0.9 % IV SOLN
10.0000 mg | Freq: Once | INTRAVENOUS | Status: AC
  Administered 2020-09-08: 10 mg via INTRAVENOUS
  Filled 2020-09-08: qty 10

## 2020-09-08 MED ORDER — ZOLEDRONIC ACID 4 MG/100ML IV SOLN
4.0000 mg | Freq: Once | INTRAVENOUS | Status: AC
  Administered 2020-09-08: 4 mg via INTRAVENOUS
  Filled 2020-09-08: qty 100

## 2020-09-08 MED ORDER — HEPARIN SOD (PORK) LOCK FLUSH 100 UNIT/ML IV SOLN
INTRAVENOUS | Status: AC
  Filled 2020-09-08: qty 5

## 2020-09-08 MED ORDER — DIPHENHYDRAMINE HCL 50 MG/ML IJ SOLN
50.0000 mg | Freq: Once | INTRAMUSCULAR | Status: AC
Start: 2020-09-08 — End: 2020-09-08
  Administered 2020-09-08: 50 mg via INTRAVENOUS
  Filled 2020-09-08: qty 1

## 2020-09-08 MED ORDER — SODIUM CHLORIDE 0.9 % IV SOLN
Freq: Once | INTRAVENOUS | Status: DC | PRN
  Filled 2020-09-08: qty 250

## 2020-09-08 MED ORDER — SODIUM CHLORIDE 0.9 % IV SOLN
2.0000 g | Freq: Once | INTRAVENOUS | Status: AC
  Administered 2020-09-08: 2 g via INTRAVENOUS
  Filled 2020-09-08: qty 20

## 2020-09-08 MED ORDER — FAMOTIDINE IN NACL 20-0.9 MG/50ML-% IV SOLN
20.0000 mg | Freq: Once | INTRAVENOUS | Status: AC
  Administered 2020-09-08: 20 mg via INTRAVENOUS
  Filled 2020-09-08: qty 50

## 2020-09-08 MED ORDER — SODIUM CHLORIDE 0.9 % IV SOLN
80.0000 mg/m2 | Freq: Once | INTRAVENOUS | Status: AC
  Administered 2020-09-08: 162 mg via INTRAVENOUS
  Filled 2020-09-08: qty 27

## 2020-09-08 MED ORDER — SODIUM CHLORIDE 0.9 % IV SOLN
Freq: Once | INTRAVENOUS | Status: AC
  Filled 2020-09-08: qty 250

## 2020-09-08 MED ORDER — HEPARIN SOD (PORK) LOCK FLUSH 100 UNIT/ML IV SOLN
500.0000 [IU] | Freq: Once | INTRAVENOUS | Status: AC | PRN
  Administered 2020-09-08: 500 [IU]
  Filled 2020-09-08: qty 5

## 2020-09-08 NOTE — Progress Notes (Signed)
Hematology/Oncology Follow Up Note [facility]  [phone] [phone]  Patient Care Team: [place] as PCP - General Rico Junker, RN as Registered Nurse   Name of the patient: Jessica Willis  [id number]  01-Aug-1966   REASON FOR VISIT  follow-up for triple negative breast cancer  INTERVAL HISTORY 55 y.o. with past medical history including anemia, family history of breast cancer presents for follow-up of management of metastatic triple negative breast cancer.  06/15/2020-07/01/2020 Patient was admitted due to shortness of breath.  Also reported to have right breast cancer which she has noticed for about at least 6 months.  Initial x-ray and a CT showed massive right-sided pleural effusion , mass-effect and shift to the midline, near complete complex of the right lung, right breast mass extending to the skin surface associated with skin thickening and right axillary lymphadenopathy. Patient had initially pleural catheter placement later chest tube placement. Patient was admitted to ICU due to respiratory failure.  She was also seen by Dr. Genevive Bi initially and there was an attempt for tac pleurodesis which was not successful.  Chest tube was removed. Patient's respiratory status improved. Initial pleural effusion cytology came back positive for adenocarcinoma compatible with breast origin.  Triple negative due to lack of tissue for additional testing.  Patient admitted breast mass biopsy as well as Mediport placement by Dr. Lysle Pearl.  Patient was given first dose of Taxol 80 mg/m during her admission and she tolerated well. Patient was discharged on 07/01/2020.  Respiratory status improved and she was discharged home without home oxygen.  # MRI brain showed no intracranial brain metastasis.  1 cm metastasis affecting the clivus.  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  NGS Omniseq insight showed AXIN1 G4255,  LAMP1 gain, LRP1B E4499, MYC gain, NOTCH-BRD4 fusion. TMB 1.47mt/mb not high.  MS Stable, CPS <1  [employer] genetic testing: Negative  08/18/2020, COVID-19 infection.    INTERVAL HISTORY BPricsilla Lindvallis a 55y.o. female who has above history reviewed by me today presents for follow up visit for management of triple negative breast cancer Problems and complaints are listed below: Patient has been on weekly Taxol for treatment of metastatic triple negative breast cancer. Chemotherapy was delayed with recent COVID-19 infection.  Patient reports being symptomatic with cough, fatigue, loss of taste.  Appetite has decreased and she has lost 7 pounds. She did not receive monoclonal antibody infusion as the schedule did not work out for her and her daughter. She reports feeling better.  Cough has completely resolved. She noticed that her right breast mass nodularity is getting worse.  No fever or chills, shortness of breath more than her baseline, bone pain.  Denies any numbness or tingling.  Review of Systems  Constitutional: Positive for appetite change, fatigue and unexpected weight change. Negative for chills and fever.  HENT:   Negative for hearing loss and voice change.   Eyes: Negative for eye problems.  Respiratory: Negative for chest tightness, cough and shortness of breath.   Cardiovascular: Negative for chest pain and leg swelling.  Gastrointestinal: Negative for abdominal distention, abdominal pain and blood in stool.  Endocrine: Negative for hot flashes.  Genitourinary: Negative for difficulty urinating and frequency.   Musculoskeletal: Negative for arthralgias.  Skin: Negative for itching and rash.  Neurological: Negative for extremity weakness.  Hematological: Negative for adenopathy.  Psychiatric/Behavioral: Negative for confusion.      No Known Allergies   Past Medical History:  Diagnosis Date  . Anemia   . Family history of breast cancer   . Patient denies medical  problems      Past Surgical History:  Procedure Laterality Date  . BREAST BIOPSY  06/28/2020   Procedure: BREAST BIOPSY;  Surgeon: Benjamine Sprague, DO;  Location: [facility] ORS;  Service: General;;  . PORTACATH PLACEMENT N/A 06/28/2020   Procedure: INSERTION PORT-A-CATH;  Surgeon: Benjamine Sprague, DO;  Location: [facility] ORS;  Service: General;  Laterality: N/A;  . TUBAL LIGATION    . VIDEO ASSISTED THORACOSCOPY (VATS)/THOROCOTOMY Right 06/23/2020   Procedure: ATTEMPTED VIDEO ASSISTED THORACOSCOPY (VATS);  Surgeon: Nestor Lewandowsky, MD;  Location: [facility] ORS;  Service: General;  Laterality: Right;    Social History   Socioeconomic History  . Marital status: Single    Spouse name: Not on file  . Number of children: Not on file  . Years of education: Not on file  . Highest education level: Not on file  Occupational History  . Not on file  Tobacco Use  . Smoking status: Never Smoker  . Smokeless tobacco: Never Used  Substance and Sexual Activity  . Alcohol use: Not Currently    Alcohol/week: 1.0 standard drink    Types: 1 Cans of beer per week  . Drug use: No  . Sexual activity: Not on file  Other Topics Concern  . Not on file  Social History Narrative  . Not on file   Social Determinants of Health   Financial Resource Strain: Not on file  Food Insecurity: Not on file  Transportation Needs: Not on file  Physical Activity: Not on file  Stress: Not on file  Social Connections: Not on file  Intimate Partner Violence: Not on file    Family History  Problem Relation Age of Onset  . Diabetes Other   . Hypertension Other   . Diabetes Maternal Aunt   . Breast cancer Cousin        dx 26s  . Breast cancer Cousin        dx 41s     Current Outpatient Medications:  .  cholecalciferol (VITAMIN D3) 25 MCG (1000 UNIT) tablet, TAKE 1 TABLET BY MOUTH EVERY DAY (Patient not taking: No sig reported), Disp: 30 tablet, Rfl: 0 .  calcium carbonate (CALCIUM 600) 600 MG TABS tablet, Take 2 tablets  (1,200 mg total) by mouth daily with breakfast., Disp: 60 tablet, Rfl: 1 .  chlorpheniramine-HYDROcodone (TUSSIONEX) 10-8 MG/5ML SUER, Take 5 mLs by mouth every 12 (twelve) hours as needed for cough., Disp: 115 mL, Rfl: 0 .  FEROSUL 325 (65 Fe) MG tablet, TAKE ONE (1) TABLET BY MOUTH TWO TIMES PER DAY WITH A MEAL, Disp: 60 tablet, Rfl: 1 .  folic acid (FOLVITE) 1 MG tablet, Take 1 tablet (1 mg total) by mouth daily., Disp: 60 tablet, Rfl: 0 .  lidocaine-prilocaine (EMLA) cream, Apply 1 application topically as needed. Place a small amount of cream to port site prior to each chemo treatment, 1-2 hours before treatment., Disp: 30 g, Rfl: 0 .  pantoprazole (PROTONIX) 40 MG tablet, TAKE 1 TABLET BY MOUTH EVERY DAY, Disp: 30 tablet, Rfl: 0  Physical exam:  Vitals:   09/08/20 0908 09/08/20 0934  BP: (!) 158/92   Pulse: (!) 126 (!) 112  Resp: 18   Temp: 99.3 F (37.4 C)   SpO2: 96%   Weight: 181 lb 11.2 oz (82.4 kg)    Physical Exam Constitutional:      General: She is not  in acute distress.    Comments: Thin built  HENT:     Head: Normocephalic and atraumatic.  Eyes:     General: No scleral icterus. Cardiovascular:     Rate and Rhythm: Normal rate and regular rhythm.     Heart sounds: Normal heart sounds.  Pulmonary:     Effort: Pulmonary effort is normal. No respiratory distress.     Breath sounds: No wheezing.     Comments: Decreased breath sound on the right. Abdominal:     General: Bowel sounds are normal. There is no distension.     Palpations: Abdomen is soft.  Musculoskeletal:        General: No deformity. Normal range of motion.     Cervical back: Normal range of motion and neck supple.     Comments: Bilateral ankle edema  Skin:    General: Skin is warm and dry.     Findings: No erythema or rash.     Comments: + Left anterior chest wall Mediport.  Neurological:     Mental Status: She is alert and oriented to person, place, and time. Mental status is at baseline.      Cranial Nerves: No cranial nerve deficit.     Coordination: Coordination normal.  Psychiatric:        Mood and Affect: Mood normal.   Large right breast mass with skin involvement.  CMP Latest Ref Rng & Units 08/11/2020  Glucose 70 - 99 mg/dL 145(H)  BUN 6 - 20 mg/dL 6  Creatinine 0.44 - 1.00 mg/dL 0.41(L)  Sodium 135 - 145 mmol/L 137  Potassium 3.5 - 5.1 mmol/L 3.4(L)  Chloride 98 - 111 mmol/L 104  CO2 22 - 32 mmol/L 26  Calcium 8.9 - 10.3 mg/dL 8.5(L)  Total Protein 6.5 - 8.1 g/dL 7.0  Total Bilirubin 0.3 - 1.2 mg/dL 0.4  Alkaline Phos 38 - 126 U/L 69  AST 15 - 41 U/L 27  ALT 0 - 44 U/L 10   CBC Latest Ref Rng & Units 08/11/2020  WBC 4.0 - 10.5 K/uL 10.0  Hemoglobin 12.0 - 15.0 g/dL 10.3(L)  Hematocrit 36.0 - 46.0 % 31.8(L)  Platelets 150 - 400 K/uL 305    RADIOGRAPHIC STUDIES: I have personally reviewed the radiological images as listed and agreed with the findings in the report. No results found.   Assessment and plan Patient is a 55 y.o. female presents for follow-up of metastatic triple negative breast cancer 1. Metastatic breast cancer ([place])   2. Encounter for antineoplastic chemotherapy   3. Bone metastasis ([place])   4. Pancreatic lesion   5. Hypocalcemia    #Stage IV metastatic triple negative breast cancer.  On palliative chemotherapy. Labs are reviewed and discussed with patient Counts acceptable to proceed with Taxol treatment today.  Chemo was recently delayed due to COVID-19 infection. Patient gets wound care at home.  I recommend patient to follow-up with Dr. Lysle Pearl periodically for her right breast wound.  #Pancreatic lesion, metastasis versus inflammatory process versus primary pancreatic lesion.  CA 19-9 is negative.  We will follow up this nodule on image #Tachycardia, this is has been a chronic issue for her.  Continue monitor. 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  #Bone metastasis, continue monthly Zometa.  She will  receive Zometa today #Hypocalcemia, patient is taking calcium 1200 mg daily.  I advised patient to increase to 1500 mg daily. Calcium level is 8.4, she will receive IV calcium gluconate 2 g x  1 today  # Iron deficiency anemia s/p IV venofer x 2 Continue oral iron supplementation ferrous sulfate 325 mg twice daily.  Hemoglobin stable at 10.4. #Leg swelling, no DVT.  Recommend leg elevation, discussed about compression stocking.  #GERD, continue Protonix 40 mg daily.  To follow-up with palliative care service patient will follow up in  1 week.   Earlie Server, MD, PhD Hematology Oncology [facility] Pager- [phone] 09/08/2020

## 2020-09-08 NOTE — Progress Notes (Signed)
Patient here for follow up. Pt recently recovered from COVID infection. Pt had bad cough that has now eased down. Pt reports that wound to right chest has gotten worse, "it does not hurt, but it itches." Pt has had a 7 pound weight loss since last visit due to decreased appetite from COVID infection

## 2020-09-08 NOTE — Progress Notes (Signed)
1355: Approx. 5 minutes after starting Taxol infusion, patient started c/o feeling hot "all of a sudden". Denies any other s/s. Infusion was paused. Sonia Baller, NP made aware. BP 147/81, HR 102, R 20, O2 94% RA, 0 Pain.   1358: 1L NS Bolus started 1400: Sonia Baller, NP at chairside. Patient states she is starting to feel better. *See flow sheets for follow up VS* 1402: Symptoms have relieved.   Per Sonia Baller, NP, continue NS bolus until further instruction.   1423Sonia Baller, NP at chairside again. Per Sonia Baller, NP, restart Taxol treatment and stop IVF.   Patient tolerated remainder of Taxol infusion well. VS within patient's normal parameters. Patient denies any s/s at this time. Discharged home.

## 2020-09-09 ENCOUNTER — Telehealth: Payer: Self-pay | Admitting: Pharmacist

## 2020-09-09 LAB — CANCER ANTIGEN 15-3: CA 15-3: 233 U/mL — ABNORMAL HIGH (ref 0.0–25.0)

## 2020-09-09 LAB — CANCER ANTIGEN 27.29: CA 27.29: 781.8 U/mL — ABNORMAL HIGH (ref 0.0–38.6)

## 2020-09-09 NOTE — Telephone Encounter (Signed)
Patient failed to provide requested 2021 and 2022 financial documentation. No additional medication assistance will be provided by [facility] without the required proof of income documentation. Patient notified by letter. [place] [facility]

## 2020-09-13 ENCOUNTER — Ambulatory Visit
Admission: RE | Admit: 2020-09-13 | Discharge: 2020-09-13 | Disposition: A | Discharge status: Home / Self Care | Payer: Medicaid Other | Source: Ambulatory Visit | Attending: Oncology | Admitting: Oncology

## 2020-09-13 ENCOUNTER — Ambulatory Visit
Admission: RE | Admit: 2020-09-13 | Discharge: 2020-09-13 | Disposition: A | Discharge status: Home / Self Care | Payer: Medicaid Other | Attending: Oncology | Admitting: Oncology

## 2020-09-13 DIAGNOSIS — C50919 Malignant neoplasm of unspecified site of unspecified female breast: Secondary | ICD-10-CM | POA: Diagnosis present

## 2020-09-13 IMAGING — CR DG CHEST 2V
2 series · 2 of 2 positions shown · non-contrast
Comparison: [DATE].

CLINICAL DATA: Metastatic breast cancer.  [E2].

EXAM:
CHEST - 2 VIEW

[chest pa]
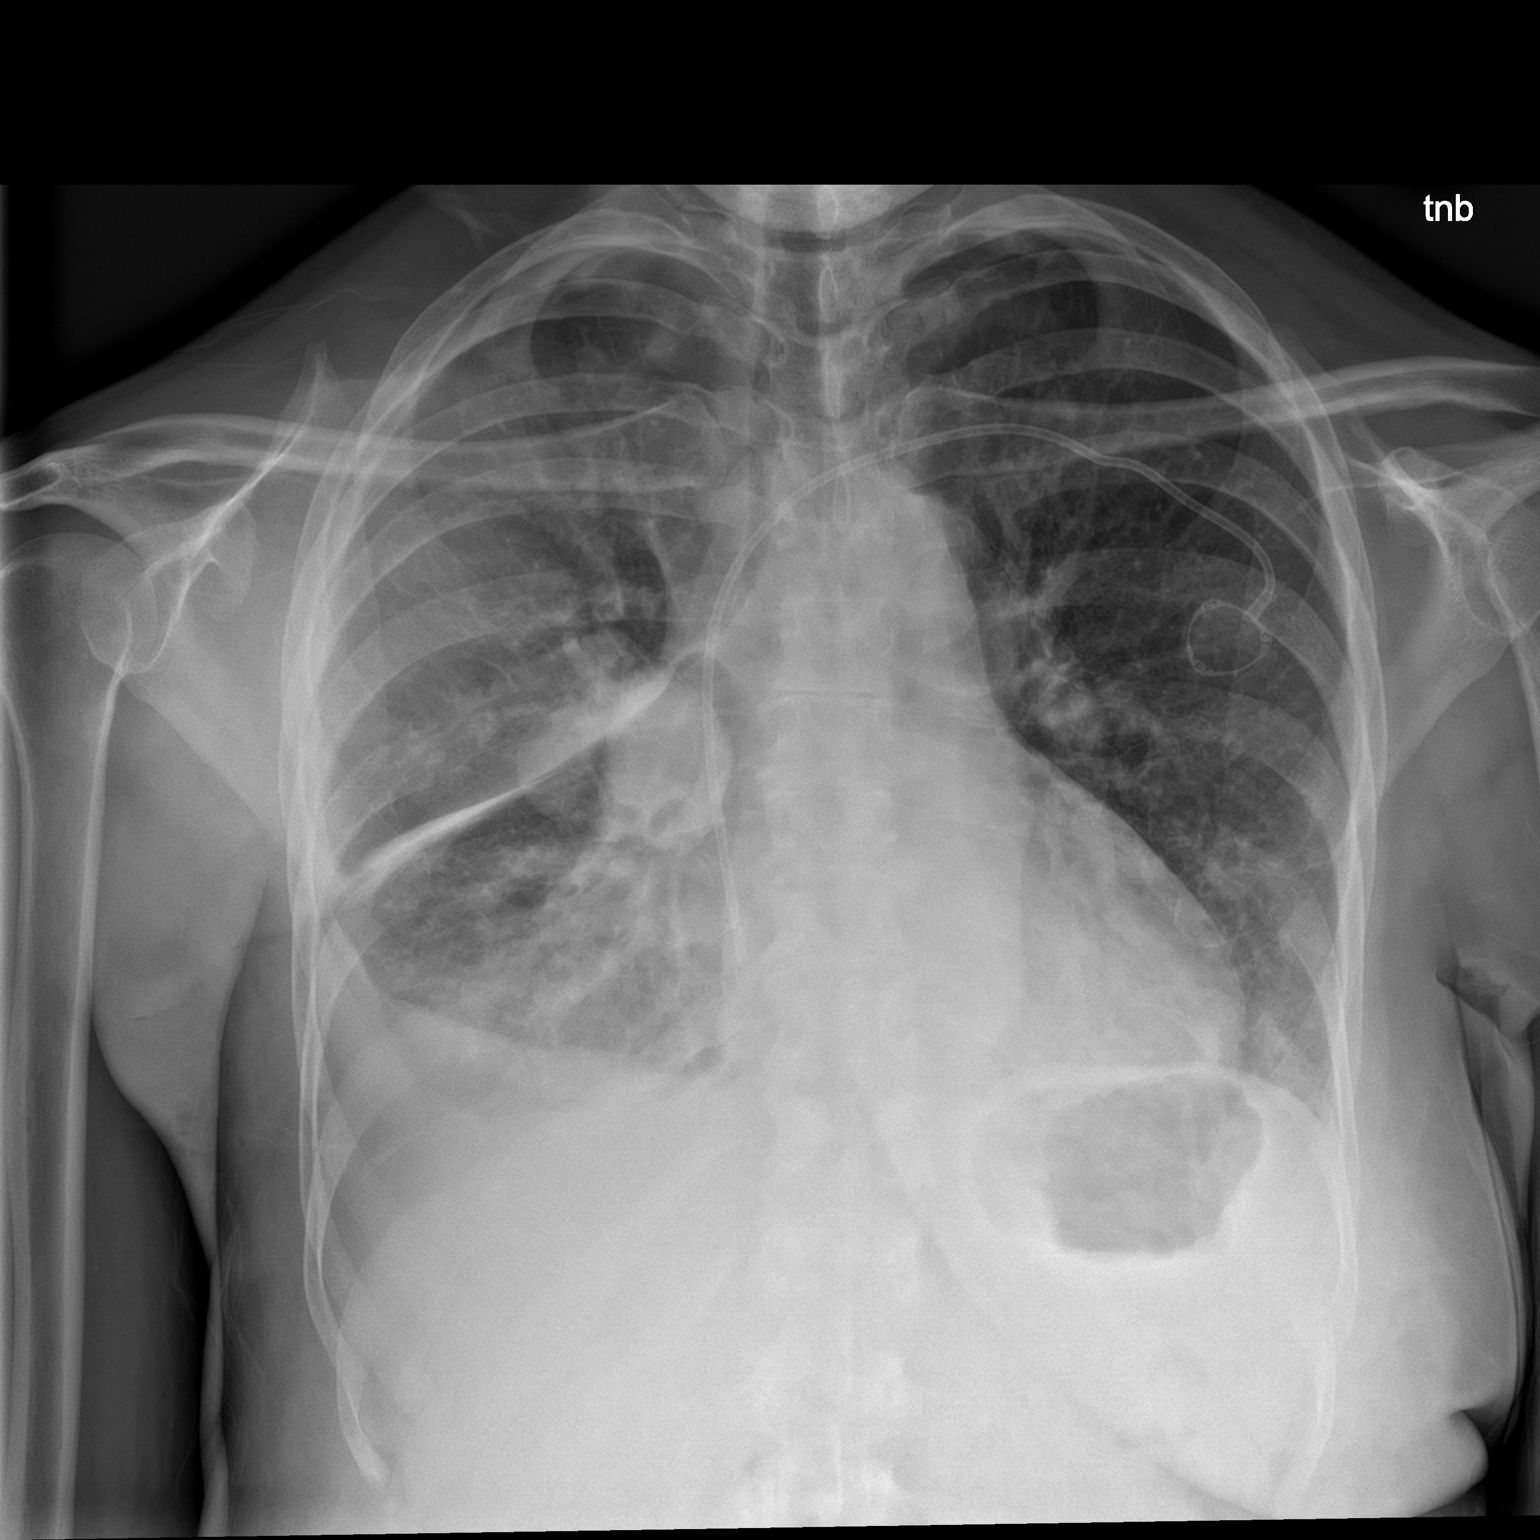

[chest lat]
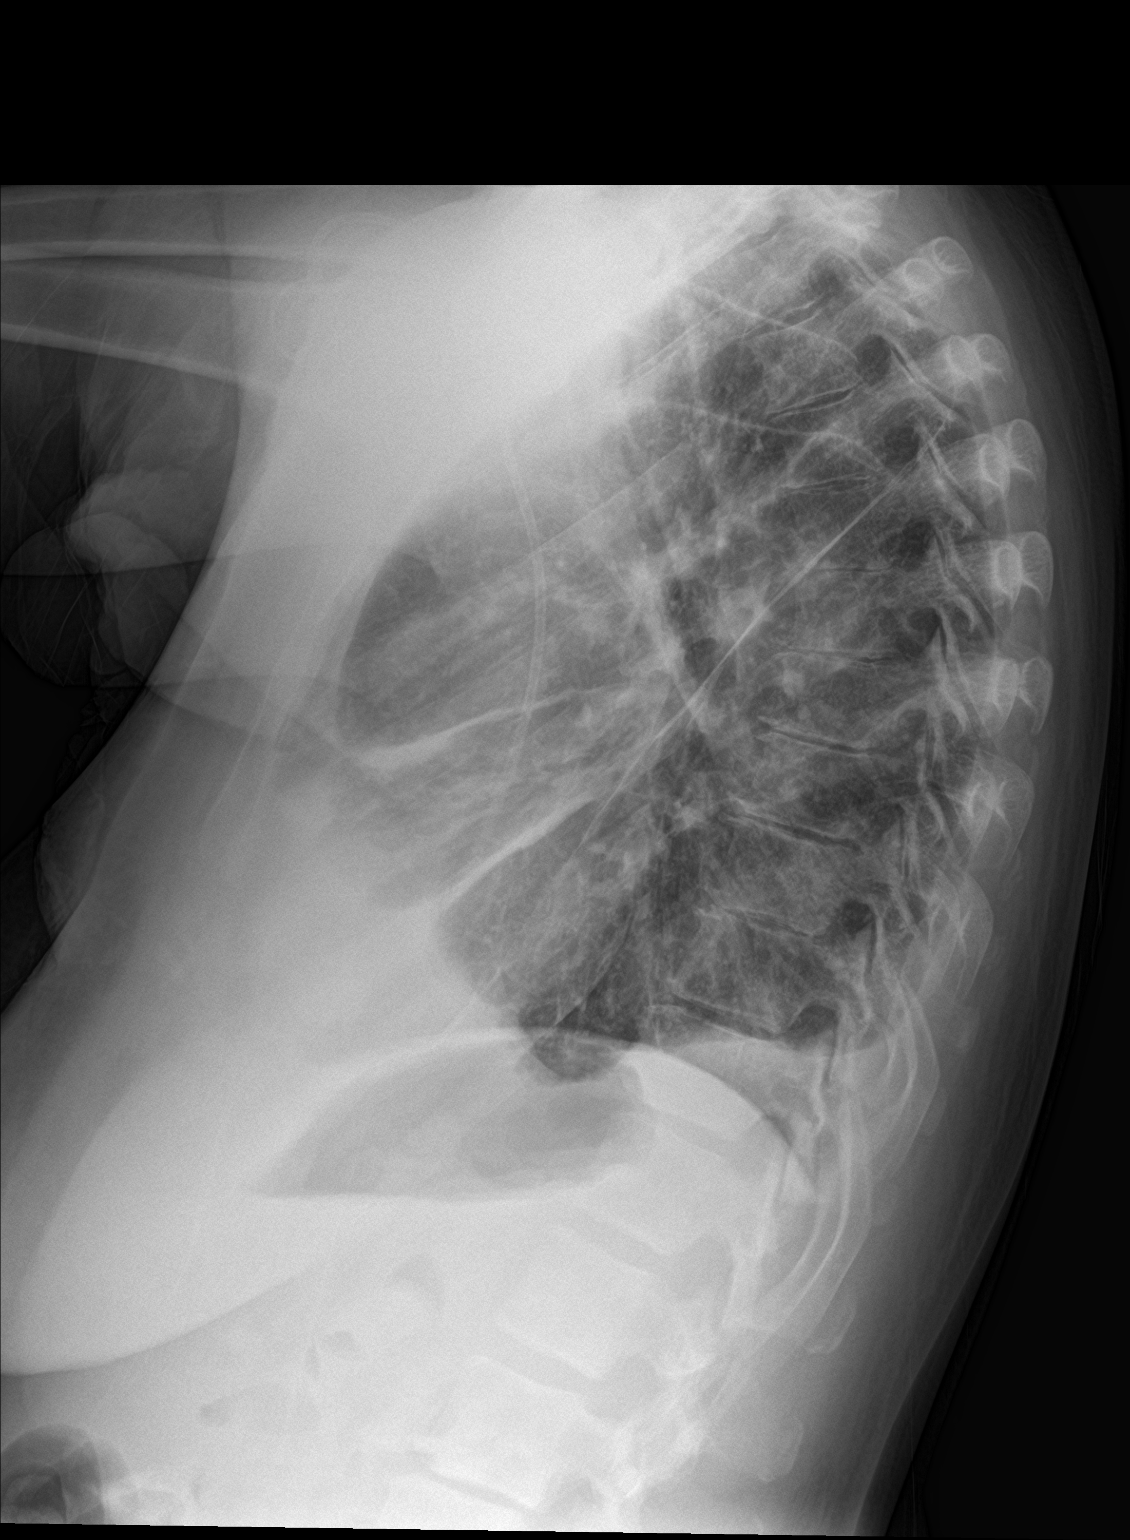

[2 of 2 positions shown; findings below may reference images not displayed]

FINDINGS: Stable cardiomediastinal silhouette. Left subclavian Port-A-Cath is
unchanged in position. No pneumothorax is noted. Large right
perihilar mass is noted concerning for metastatic disease. Mild
right pleural effusion is noted. Right upper and lower lobe airspace
opacities are noted concerning for pneumonia. Stable left basilar
atelectasis or infiltrate is noted. Bony thorax is unremarkable.
IMPRESSION: Large right perihilar mass is noted concerning for metastatic
disease. Right upper and lower lobe airspace opacities are noted
concerning for pneumonia. Mild right pleural effusion is noted.

## 2020-09-15 ENCOUNTER — Inpatient Hospital Stay: Payer: Medicaid Other

## 2020-09-15 ENCOUNTER — Inpatient Hospital Stay: Payer: Medicaid Other | Attending: Oncology

## 2020-09-15 ENCOUNTER — Encounter: Payer: Self-pay | Admitting: Oncology

## 2020-09-15 ENCOUNTER — Other Ambulatory Visit: Payer: Self-pay

## 2020-09-15 ENCOUNTER — Inpatient Hospital Stay (HOSPITAL_BASED_OUTPATIENT_CLINIC_OR_DEPARTMENT_OTHER): Payer: Medicaid Other | Admitting: Oncology

## 2020-09-15 VITALS — BP 144/88 | HR 109 | Temp 98.0°F

## 2020-09-15 VITALS — BP 136/75 | HR 126 | Temp 98.1°F | Resp 18 | Wt 178.5 lb

## 2020-09-15 DIAGNOSIS — C7951 Secondary malignant neoplasm of bone: Secondary | ICD-10-CM | POA: Insufficient documentation

## 2020-09-15 DIAGNOSIS — Z5111 Encounter for antineoplastic chemotherapy: Secondary | ICD-10-CM | POA: Diagnosis not present

## 2020-09-15 DIAGNOSIS — R000 Tachycardia, unspecified: Secondary | ICD-10-CM | POA: Insufficient documentation

## 2020-09-15 DIAGNOSIS — M7989 Other specified soft tissue disorders: Secondary | ICD-10-CM | POA: Insufficient documentation

## 2020-09-15 DIAGNOSIS — Z171 Estrogen receptor negative status [ER-]: Secondary | ICD-10-CM | POA: Insufficient documentation

## 2020-09-15 DIAGNOSIS — C50911 Malignant neoplasm of unspecified site of right female breast: Secondary | ICD-10-CM | POA: Insufficient documentation

## 2020-09-15 DIAGNOSIS — Z8616 Personal history of COVID-19: Secondary | ICD-10-CM | POA: Diagnosis not present

## 2020-09-15 DIAGNOSIS — Z803 Family history of malignant neoplasm of breast: Secondary | ICD-10-CM | POA: Insufficient documentation

## 2020-09-15 DIAGNOSIS — J910 Malignant pleural effusion: Secondary | ICD-10-CM | POA: Insufficient documentation

## 2020-09-15 DIAGNOSIS — K869 Disease of pancreas, unspecified: Secondary | ICD-10-CM | POA: Diagnosis not present

## 2020-09-15 DIAGNOSIS — C50919 Malignant neoplasm of unspecified site of unspecified female breast: Secondary | ICD-10-CM

## 2020-09-15 DIAGNOSIS — D509 Iron deficiency anemia, unspecified: Secondary | ICD-10-CM | POA: Diagnosis not present

## 2020-09-15 LAB — COMPREHENSIVE METABOLIC PANEL
ALT: 11 U/L (ref 0–44)
AST: 30 U/L (ref 15–41)
Albumin: 3.6 g/dL (ref 3.5–5.0)
Alkaline Phosphatase: 84 U/L (ref 38–126)
Anion gap: 9 (ref 5–15)
BUN: 8 mg/dL (ref 6–20)
CO2: 24 mmol/L (ref 22–32)
Calcium: 9.6 mg/dL (ref 8.9–10.3)
Chloride: 104 mmol/L (ref 98–111)
Creatinine, Ser: 0.47 mg/dL (ref 0.44–1.00)
GFR, Estimated: >60 mL/min (ref >60)
Glucose, Bld: 126 mg/dL — ABNORMAL HIGH (ref 70–99)
Potassium: 4 mmol/L (ref 3.5–5.1)
Sodium: 137 mmol/L (ref 135–145)
Total Bilirubin: 0.3 mg/dL (ref 0.3–1.2)
Total Protein: 7.5 g/dL (ref 6.5–8.1)

## 2020-09-15 LAB — CBC WITH DIFFERENTIAL/PLATELET
Abs Immature Granulocytes: 0.05 10*3/uL (ref 0.00–0.07)
Basophils Absolute: 0.1 10*3/uL (ref 0.0–0.1)
Basophils Relative: 1 %
Eosinophils Absolute: 0.2 10*3/uL (ref 0.0–0.5)
Eosinophils Relative: 3 %
HCT: 33.7 % — ABNORMAL LOW (ref 36.0–46.0)
Hemoglobin: 11 g/dL — ABNORMAL LOW (ref 12.0–15.0)
Immature Granulocytes: 1 %
Lymphocytes Relative: 16 %
Lymphs Abs: 1.4 10*3/uL (ref 0.7–4.0)
MCH: 26.2 pg (ref 26.0–34.0)
MCHC: 32.6 g/dL (ref 30.0–36.0)
MCV: 80.2 fL (ref 80.0–100.0)
Monocytes Absolute: 0.5 10*3/uL (ref 0.1–1.0)
Monocytes Relative: 6 %
Neutro Abs: 6.4 10*3/uL (ref 1.7–7.7)
Neutrophils Relative %: 73 %
Platelets: 359 10*3/uL (ref 150–400)
RBC: 4.2 MIL/uL (ref 3.87–5.11)
RDW: 15.8 % — ABNORMAL HIGH (ref 11.5–15.5)
WBC: 8.5 10*3/uL (ref 4.0–10.5)
nRBC: 0 % (ref 0.0–0.2)

## 2020-09-15 MED ORDER — SODIUM CHLORIDE 0.9 % IV SOLN
Freq: Once | INTRAVENOUS | Status: DC | PRN
  Filled 2020-09-15: qty 250

## 2020-09-15 MED ORDER — HEPARIN SOD (PORK) LOCK FLUSH 100 UNIT/ML IV SOLN
500.0000 [IU] | Freq: Once | INTRAVENOUS | Status: DC | PRN
  Filled 2020-09-15: qty 5

## 2020-09-15 MED ORDER — HEPARIN SOD (PORK) LOCK FLUSH 100 UNIT/ML IV SOLN
500.0000 [IU] | Freq: Once | INTRAVENOUS | Status: AC
  Administered 2020-09-15: 500 [IU] via INTRAVENOUS
  Filled 2020-09-15: qty 5

## 2020-09-15 MED ORDER — SODIUM CHLORIDE 0.9% FLUSH
10.0000 mL | INTRAVENOUS | Status: DC | PRN
  Administered 2020-09-15: 10 mL via INTRAVENOUS
  Filled 2020-09-15: qty 10

## 2020-09-15 MED ORDER — SODIUM CHLORIDE 0.9 % IV SOLN
Freq: Once | INTRAVENOUS | Status: AC
  Filled 2020-09-15: qty 250

## 2020-09-15 MED ORDER — FAMOTIDINE IN NACL 20-0.9 MG/50ML-% IV SOLN
20.0000 mg | Freq: Once | INTRAVENOUS | Status: AC
  Administered 2020-09-15: 20 mg via INTRAVENOUS
  Filled 2020-09-15: qty 50

## 2020-09-15 MED ORDER — SODIUM CHLORIDE 0.9 % IV SOLN
80.0000 mg/m2 | Freq: Once | INTRAVENOUS | Status: AC
  Administered 2020-09-15: 162 mg via INTRAVENOUS
  Filled 2020-09-15: qty 27

## 2020-09-15 MED ORDER — SODIUM CHLORIDE 0.9 % IV SOLN
10.0000 mg | Freq: Once | INTRAVENOUS | Status: AC
  Administered 2020-09-15: 10 mg via INTRAVENOUS
  Filled 2020-09-15: qty 10

## 2020-09-15 MED ORDER — DIPHENHYDRAMINE HCL 50 MG/ML IJ SOLN
50.0000 mg | Freq: Once | INTRAMUSCULAR | Status: AC
  Administered 2020-09-15: 50 mg via INTRAVENOUS
  Filled 2020-09-15: qty 1

## 2020-09-15 NOTE — Progress Notes (Signed)
HR 115. MD ok to proceed, patient got 1L fluids before treatment

## 2020-09-15 NOTE — Progress Notes (Signed)
1206: Pt reports feeling hot, sweaty and nauseous. Pt denies any other symptoms or concerns. Taxol stopped, NS started and Faythe Casa NP aware.  Faythe Casa NP at chairside.  1215: VS remained stable, Pt reports all symptoms resolved and at baseline. Per Faythe Casa NP okay to restart Taxol at ordered rate.  Taxol restarted.   Pt tolerated remainder of Taxol infusion well, no s/s of distress or reaction noted. Pt denies any s/s of concerns at this time. Pt stable at discharge.

## 2020-09-15 NOTE — Addendum Note (Signed)
Addended by: Evelina Dun on: 09/15/2020 01:58 PM   Modules accepted: Orders

## 2020-09-15 NOTE — Addendum Note (Signed)
Addended by: Earlie Server on: 09/15/2020 12:47 PM   Modules accepted: Orders

## 2020-09-15 NOTE — Progress Notes (Signed)
Pt here for follow up. No new concerns voiced.   

## 2020-09-15 NOTE — Progress Notes (Signed)
Hematology/Oncology Follow Up Note [facility]  [phone] [phone]  Patient Care Team: [place] as PCP - General Rico Junker, RN as Registered Nurse Benjamine Sprague, DO as Consulting Physician (Surgery)   Name of the patient: Jessica Willis  [id number]  1966-03-18   REASON FOR VISIT  follow-up for triple negative breast cancer  INTERVAL HISTORY 55 y.o. with past medical history including anemia, family history of breast cancer presents for follow-up of management of metastatic triple negative breast cancer.  06/15/2020-07/01/2020 Patient was admitted due to shortness of breath.  Also reported to have right breast cancer which she has noticed for about at least 6 months.  Initial x-ray and a CT showed massive right-sided pleural effusion , mass-effect and shift to the midline, near complete complex of the right lung, right breast mass extending to the skin surface associated with skin thickening and right axillary lymphadenopathy. Patient had initially pleural catheter placement later chest tube placement. Patient was admitted to ICU due to respiratory failure.  She was also seen by Dr. Genevive Bi initially and there was an attempt for tac pleurodesis which was not successful.  Chest tube was removed. Patient's respiratory status improved. Initial pleural effusion cytology came back positive for adenocarcinoma compatible with breast origin.  Triple negative due to lack of tissue for additional testing.  Patient admitted breast mass biopsy as well as Mediport placement by Dr. Lysle Pearl.  Patient was given first dose of Taxol 80 mg/m during her admission and she tolerated well. Patient was discharged on 07/01/2020.  Respiratory status improved and she was discharged home without home oxygen.  # MRI brain showed no intracranial brain metastasis.  1 cm metastasis affecting the clivus.  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  NGS Omniseq insight showed AXIN1 G4255, LAMP1 gain, LRP1B E4499, MYC gain, NOTCH-BRD4 fusion. TMB 1.47mt/mb not high.  MS Stable, CPS <1  Invitae genetic testing: Negative  08/18/2020, COVID-19 infection.    INTERVAL HISTORY BTarrah Furutais a 55y.o. female who has above history reviewed by me today presents for follow up visit for management of triple negative breast cancer Problems and complaints are listed below: Very mild numbness or tingling of her toes.  Denies any breathing difficulties, cough. She is chronically tachycardic.  She reports that she is drinking enough fluid.  Appetite is low.    Review of Systems  Constitutional: Positive for appetite change, fatigue and unexpected weight change. Negative for chills and fever.  HENT:   Negative for hearing loss and voice change.   Eyes: Negative for eye problems.  Respiratory: Negative for chest tightness, cough and shortness of breath.   Cardiovascular: Negative for chest pain and leg swelling.  Gastrointestinal: Negative for abdominal distention, abdominal pain and blood in stool.  Endocrine: Negative for hot flashes.  Genitourinary: Negative for difficulty urinating and frequency.   Musculoskeletal: Negative for arthralgias.  Skin: Negative for itching and rash.  Neurological: Negative for extremity weakness.  Hematological: Negative for adenopathy.  Psychiatric/Behavioral: Negative for confusion.      No Known Allergies   Past Medical History:  Diagnosis Date  . Anemia   . Family history of breast cancer   . Patient denies medical problems      Past Surgical History:  Procedure Laterality Date  . BREAST BIOPSY  06/28/2020   Procedure: BREAST BIOPSY;  Surgeon: SBenjamine Sprague DO;  Location: [facility] ORS;  Service: General;;  . PORTACATH PLACEMENT N/A 06/28/2020  Procedure: INSERTION PORT-A-CATH;  Surgeon: Benjamine Sprague, DO;  Location: [facility] ORS;  Service: General;  Laterality: N/A;  . TUBAL LIGATION     . VIDEO ASSISTED THORACOSCOPY (VATS)/THOROCOTOMY Right 06/23/2020   Procedure: ATTEMPTED VIDEO ASSISTED THORACOSCOPY (VATS);  Surgeon: Nestor Lewandowsky, MD;  Location: [facility] ORS;  Service: General;  Laterality: Right;    Social History   Socioeconomic History  . Marital status: Single    Spouse name: Not on file  . Number of children: Not on file  . Years of education: Not on file  . Highest education level: Not on file  Occupational History  . Not on file  Tobacco Use  . Smoking status: Never Smoker  . Smokeless tobacco: Never Used  Substance and Sexual Activity  . Alcohol use: Not Currently    Alcohol/week: 1.0 standard drink    Types: 1 Cans of beer per week  . Drug use: No  . Sexual activity: Not on file  Other Topics Concern  . Not on file  Social History Narrative  . Not on file   Social Determinants of Health   Financial Resource Strain: Not on file  Food Insecurity: Not on file  Transportation Needs: Not on file  Physical Activity: Not on file  Stress: Not on file  Social Connections: Not on file  Intimate Partner Violence: Not on file    Family History  Problem Relation Age of Onset  . Diabetes Other   . Hypertension Other   . Diabetes Maternal Aunt   . Breast cancer Cousin        dx 24s  . Breast cancer Cousin        dx 55s     Current Outpatient Medications:  .  cholecalciferol (VITAMIN D3) 25 MCG (1000 UNIT) tablet, TAKE 1 TABLET BY MOUTH EVERY DAY (Patient not taking: Reported on 09/08/2020), Disp: 30 tablet, Rfl: 0 .  calcium carbonate (CALCIUM 600) 600 MG TABS tablet, Take 2 tablets (1,200 mg total) by mouth daily with breakfast., Disp: 60 tablet, Rfl: 1 .  chlorpheniramine-HYDROcodone (TUSSIONEX) 10-8 MG/5ML SUER, Take 5 mLs by mouth every 12 (twelve) hours as needed for cough. (Patient not taking: Reported on 09/08/2020), Disp: 115 mL, Rfl: 0 .  FEROSUL 325 (65 Fe) MG tablet, TAKE ONE (1) TABLET BY MOUTH TWO TIMES PER DAY WITH A MEAL, Disp: 60  tablet, Rfl: 1 .  folic acid (FOLVITE) 1 MG tablet, Take 1 tablet (1 mg total) by mouth daily., Disp: 60 tablet, Rfl: 0 .  lidocaine-prilocaine (EMLA) cream, Apply 1 application topically as needed. Place a small amount of cream to port site prior to each chemo treatment, 1-2 hours before treatment., Disp: 30 g, Rfl: 0 .  pantoprazole (PROTONIX) 40 MG tablet, TAKE 1 TABLET BY MOUTH EVERY DAY, Disp: 30 tablet, Rfl: 0  Physical exam:  There were no vitals filed for this visit. Physical Exam Constitutional:      General: She is not in acute distress.    Comments: Thin built  HENT:     Head: Normocephalic and atraumatic.  Eyes:     General: No scleral icterus. Cardiovascular:     Rate and Rhythm: Normal rate and regular rhythm.     Heart sounds: Normal heart sounds.  Pulmonary:     Effort: Pulmonary effort is normal. No respiratory distress.     Breath sounds: No wheezing.     Comments: Decreased breath sound on the right. Abdominal:     General: Bowel sounds are  normal. There is no distension.     Palpations: Abdomen is soft.  Musculoskeletal:        General: No deformity. Normal range of motion.     Cervical back: Normal range of motion and neck supple.     Comments: Bilateral ankle edema  Skin:    General: Skin is warm and dry.     Findings: No erythema or rash.     Comments: + Left anterior chest wall Mediport.  Neurological:     Mental Status: She is alert and oriented to person, place, and time. Mental status is at baseline.     Cranial Nerves: No cranial nerve deficit.     Coordination: Coordination normal.  Psychiatric:        Mood and Affect: Mood normal.   Large right breast mass with skin involvement.  CMP Latest Ref Rng & Units 09/08/2020  Glucose 70 - 99 mg/dL 126(H)  BUN 6 - 20 mg/dL 8  Creatinine 0.44 - 1.00 mg/dL 0.50  Sodium 135 - 145 mmol/L 137  Potassium 3.5 - 5.1 mmol/L 3.5  Chloride 98 - 111 mmol/L 104  CO2 22 - 32 mmol/L 24  Calcium 8.9 - 10.3 mg/dL  8.4(L)  Total Protein 6.5 - 8.1 g/dL 7.4  Total Bilirubin 0.3 - 1.2 mg/dL 0.3  Alkaline Phos 38 - 126 U/L 78  AST 15 - 41 U/L 32  ALT 0 - 44 U/L 9   CBC Latest Ref Rng & Units 09/08/2020  WBC 4.0 - 10.5 K/uL 10.0  Hemoglobin 12.0 - 15.0 g/dL 10.4(L)  Hematocrit 36.0 - 46.0 % 33.0(L)  Platelets 150 - 400 K/uL 330    RADIOGRAPHIC STUDIES: I have personally reviewed the radiological images as listed and agreed with the findings in the report. DG Chest 2 View  Result Date: 09/13/2020 CLINICAL DATA:  Metastatic breast cancer.  COVID-19. EXAM: CHEST - 2 VIEW COMPARISON:  June 28, 2020. FINDINGS: Stable cardiomediastinal silhouette. Left subclavian Port-A-Cath is unchanged in position. No pneumothorax is noted. Large right perihilar mass is noted concerning for metastatic disease. Mild right pleural effusion is noted. Right upper and lower lobe airspace opacities are noted concerning for pneumonia. Stable left basilar atelectasis or infiltrate is noted. Bony thorax is unremarkable. IMPRESSION: Large right perihilar mass is noted concerning for metastatic disease. Right upper and lower lobe airspace opacities are noted concerning for pneumonia. Mild right pleural effusion is noted. Electronically Signed   By: Marijo Conception M.D.   On: 09/13/2020 16:45     Assessment and plan Patient is a 55 y.o. female presents for follow-up of metastatic triple negative breast cancer 1. Metastatic breast cancer ([place])   2. Encounter for antineoplastic chemotherapy   3. Bone metastasis ([place])   4. 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  On palliative chemotherapy. Labs are reviewed and discussed with patient counts acceptable to proceed with Taxol treatment today.  Tumor markers CA 15-3 slightly increased, CA 27.29 slightly decreased.  Seen by Dr.Sakai during the interval for wound care. Recommendation reviewed.  Continue follow up with wound care.   #Pancreatic lesion,  metastasis versus inflammatory process versus primary pancreatic lesion.  CA 19-9 is negative.  We will follow up this nodule on image #Tachycardia, this is has been a chronic issue for her.  HR 126, proceed with IV NS 1L x 1. HR came down to 115. Check 2D echo. 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  She will receive Zometa today #Hypocalcemia, patient is taking calcium 1500 mg daily.  # Iron deficiency anemia s/p IV venofer x 2 Continue oral iron supplementation ferrous sulfate 325 mg twice daily.  Hemoglobin stable at 10.4. #Leg swelling, no DVT.  Recommend leg elevation, discussed about compression stocking.  #GERD, symptom resolved.  Stop Protonix 40 mg daily.   follow up in  1 week.   Earlie Server, MD, PhD Hematology Oncology [facility] Pager- [phone] 09/15/2020

## 2020-09-22 ENCOUNTER — Inpatient Hospital Stay (HOSPITAL_BASED_OUTPATIENT_CLINIC_OR_DEPARTMENT_OTHER): Payer: Medicaid Other | Admitting: Oncology

## 2020-09-22 ENCOUNTER — Encounter: Payer: Self-pay | Admitting: Oncology

## 2020-09-22 ENCOUNTER — Inpatient Hospital Stay: Payer: Medicaid Other

## 2020-09-22 ENCOUNTER — Other Ambulatory Visit: Payer: Self-pay

## 2020-09-22 ENCOUNTER — Encounter: Payer: Self-pay | Admitting: *Deleted

## 2020-09-22 ENCOUNTER — Telehealth: Payer: Self-pay | Admitting: Oncology

## 2020-09-22 VITALS — BP 138/83 | HR 130 | Temp 99.2°F | Resp 18 | Wt 176.4 lb

## 2020-09-22 DIAGNOSIS — Z5111 Encounter for antineoplastic chemotherapy: Secondary | ICD-10-CM | POA: Diagnosis not present

## 2020-09-22 DIAGNOSIS — C50919 Malignant neoplasm of unspecified site of unspecified female breast: Secondary | ICD-10-CM | POA: Diagnosis not present

## 2020-09-22 DIAGNOSIS — R000 Tachycardia, unspecified: Secondary | ICD-10-CM

## 2020-09-22 DIAGNOSIS — C7951 Secondary malignant neoplasm of bone: Secondary | ICD-10-CM

## 2020-09-22 DIAGNOSIS — K869 Disease of pancreas, unspecified: Secondary | ICD-10-CM | POA: Diagnosis not present

## 2020-09-22 DIAGNOSIS — R9431 Abnormal electrocardiogram [ECG] [EKG]: Secondary | ICD-10-CM | POA: Insufficient documentation

## 2020-09-22 DIAGNOSIS — D509 Iron deficiency anemia, unspecified: Secondary | ICD-10-CM

## 2020-09-22 LAB — COMPREHENSIVE METABOLIC PANEL
ALT: 13 U/L (ref 0–44)
AST: 28 U/L (ref 15–41)
Albumin: 3.5 g/dL (ref 3.5–5.0)
Alkaline Phosphatase: 73 U/L (ref 38–126)
Anion gap: 11 (ref 5–15)
BUN: 9 mg/dL (ref 6–20)
CO2: 25 mmol/L (ref 22–32)
Calcium: 8.6 mg/dL — ABNORMAL LOW (ref 8.9–10.3)
Chloride: 104 mmol/L (ref 98–111)
Creatinine, Ser: 0.59 mg/dL (ref 0.44–1.00)
GFR, Estimated: >60 mL/min (ref >60)
Glucose, Bld: 115 mg/dL — ABNORMAL HIGH (ref 70–99)
Potassium: 3.4 mmol/L — ABNORMAL LOW (ref 3.5–5.1)
Sodium: 140 mmol/L (ref 135–145)
Total Bilirubin: 0.6 mg/dL (ref 0.3–1.2)
Total Protein: 7.2 g/dL (ref 6.5–8.1)

## 2020-09-22 LAB — CBC WITH DIFFERENTIAL/PLATELET
Abs Immature Granulocytes: 0.05 10*3/uL (ref 0.00–0.07)
Basophils Absolute: 0 10*3/uL (ref 0.0–0.1)
Basophils Relative: 0 %
Eosinophils Absolute: 0.1 10*3/uL (ref 0.0–0.5)
Eosinophils Relative: 1 %
HCT: 33.2 % — ABNORMAL LOW (ref 36.0–46.0)
Hemoglobin: 10.8 g/dL — ABNORMAL LOW (ref 12.0–15.0)
Immature Granulocytes: 1 %
Lymphocytes Relative: 15 %
Lymphs Abs: 1.3 10*3/uL (ref 0.7–4.0)
MCH: 25.7 pg — ABNORMAL LOW (ref 26.0–34.0)
MCHC: 32.5 g/dL (ref 30.0–36.0)
MCV: 79 fL — ABNORMAL LOW (ref 80.0–100.0)
Monocytes Absolute: 0.5 10*3/uL (ref 0.1–1.0)
Monocytes Relative: 6 %
Neutro Abs: 7 10*3/uL (ref 1.7–7.7)
Neutrophils Relative %: 77 %
Platelets: 395 10*3/uL (ref 150–400)
RBC: 4.2 MIL/uL (ref 3.87–5.11)
RDW: 15.9 % — ABNORMAL HIGH (ref 11.5–15.5)
WBC: 9 10*3/uL (ref 4.0–10.5)
nRBC: 0 % (ref 0.0–0.2)

## 2020-09-22 LAB — TSH: TSH: 1.47 u[IU]/mL (ref 0.350–4.500)

## 2020-09-22 LAB — MAGNESIUM: Magnesium: 1.5 mg/dL — ABNORMAL LOW (ref 1.7–2.4)

## 2020-09-22 MED ORDER — SODIUM CHLORIDE 0.9 % IV SOLN
Freq: Once | INTRAVENOUS | Status: DC
  Filled 2020-09-22: qty 250

## 2020-09-22 MED ORDER — HEPARIN SOD (PORK) LOCK FLUSH 100 UNIT/ML IV SOLN
500.0000 [IU] | Freq: Once | INTRAVENOUS | Status: AC
  Administered 2020-09-22: 500 [IU] via INTRAVENOUS
  Filled 2020-09-22: qty 5

## 2020-09-22 MED ORDER — MAGNESIUM CHLORIDE 64 MG PO TBEC: 1.0000 | DELAYED_RELEASE_TABLET | Freq: Every day | ORAL | 1 refills | Status: AC

## 2020-09-22 MED ORDER — POTASSIUM CHLORIDE CRYS ER 20 MEQ PO TBCR: 40.0000 meq | EXTENDED_RELEASE_TABLET | Freq: Every day | ORAL | 0 refills | Status: DC

## 2020-09-22 MED ORDER — SODIUM CHLORIDE 0.9 % IV SOLN
Freq: Once | INTRAVENOUS | Status: AC
  Filled 2020-09-22: qty 250

## 2020-09-22 MED ORDER — DIPHENHYDRAMINE HCL 50 MG/ML IJ SOLN: 50.0000 mg | Freq: Once | INTRAMUSCULAR | Status: DC

## 2020-09-22 MED ORDER — SODIUM CHLORIDE 0.9 % IV SOLN: 80.0000 mg/m2 | Freq: Once | INTRAVENOUS | Status: DC

## 2020-09-22 MED ORDER — FAMOTIDINE IN NACL 20-0.9 MG/50ML-% IV SOLN: 20.0000 mg | Freq: Once | INTRAVENOUS | Status: DC

## 2020-09-22 MED ORDER — SODIUM CHLORIDE 0.9 % IV SOLN: 10.0000 mg | Freq: Once | INTRAVENOUS | Status: DC

## 2020-09-22 NOTE — Progress Notes (Signed)
Received message from the [facility] that the patient needs assistance.  Spoke to patient.  We will assist with getting wound supplies for her through from [place] store to be paid through the foundation until her Medicaid is corrected.  Also sent in for gift card for groceries.  She is to let me know if she needs additional help.  She is agreeable.

## 2020-09-22 NOTE — Telephone Encounter (Signed)
Spoke with pt about appt for IVF scheduled tomorrow (2/11) at 12:30. Patient confirmed time.

## 2020-09-22 NOTE — Progress Notes (Signed)
Patient has not been sleeping well for the past 3 weeks.

## 2020-09-22 NOTE — Progress Notes (Signed)
Pt stable at discharge.  

## 2020-09-22 NOTE — Progress Notes (Signed)
No tx today per MD- hydration only.

## 2020-09-22 NOTE — Progress Notes (Signed)
Hematology/Oncology Follow Up Note [facility]  [phone] [phone]  Patient Care Team: [place] as PCP - General Rico Junker, RN as Registered Nurse Benjamine Sprague, DO as Consulting Physician (Surgery)   Name of the patient: Jessica Willis  [id number]  [birth year]   REASON FOR VISIT  follow-up for triple negative breast cancer  INTERVAL HISTORY 55 y.o. with past medical history including anemia, family history of breast cancer presents for follow-up of management of metastatic triple negative breast cancer.  06/15/2020-07/01/2020 Patient was admitted due to shortness of breath.  Also reported to have right breast cancer which she has noticed for about at least 6 months.  Initial x-ray and a CT showed massive right-sided pleural effusion , mass-effect and shift to the midline, near complete complex of the right lung, right breast mass extending to the skin surface associated with skin thickening and right axillary lymphadenopathy. Patient had initially pleural catheter placement later chest tube placement. Patient was admitted to ICU due to respiratory failure.  She was also seen by Dr. Genevive Bi initially and there was an attempt for tac pleurodesis which was not successful.  Chest tube was removed. Patient's respiratory status improved. Initial pleural effusion cytology came back positive for adenocarcinoma compatible with breast origin.  Triple negative due to lack of tissue for additional testing.  Patient admitted breast mass biopsy as well as Mediport placement by Dr. Lysle Pearl.  Patient was given first dose of Taxol 80 mg/m during her admission and she tolerated well. Patient was discharged on 07/01/2020.  Respiratory status improved and she was discharged home without home oxygen.  # MRI brain showed no intracranial brain metastasis.  1 cm metastasis affecting the clivus.  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  NGS Omniseq insight showed AXIN1 G4255, LAMP1 gain, LRP1B E4499, MYC gain, NOTCH-BRD4 fusion. TMB 1.68mt/mb not high.  MS Stable, CPS <1  [employer]ae genetic testing: Negative  08/18/2020, COVID-19 infection.    INTERVAL HISTORY Jessica Willis a 55y.o. female who has above history reviewed by me today presents for follow up visit for management of triple negative breast cancer Problems and complaints are listed below: Very mild numbness or tingling of her toes.  Denies any breathing difficulties, cough, palpitation.  She is chronically tachycardic no nausea vomiting and she does not take antiemetics.  Decreased appetite, lost 2 pounds since last visit.    Review of Systems  Constitutional: Positive for appetite change, fatigue and unexpected weight change. Negative for chills and fever.  HENT:   Negative for hearing loss and voice change.   Eyes: Negative for eye problems.  Respiratory: Negative for chest tightness, cough and shortness of breath.   Cardiovascular: Negative for chest pain and leg swelling.  Gastrointestinal: Negative for abdominal distention, abdominal pain and blood in stool.  Endocrine: Negative for hot flashes.  Genitourinary: Negative for difficulty urinating and frequency.   Musculoskeletal: Negative for arthralgias.  Skin: Negative for itching and rash.  Neurological: Negative for extremity weakness.  Hematological: Negative for adenopathy.  Psychiatric/Behavioral: Negative for confusion.      No Known Allergies   Past Medical History:  Diagnosis Date  . Anemia   . Family history of breast cancer   . Patient denies medical problems      Past Surgical History:  Procedure Laterality Date  . BREAST BIOPSY  06/28/2020   Procedure: BREAST BIOPSY;  Surgeon: SBenjamine Sprague DO;  Location: [facility] ORS;  Service:  General;;  . PORTACATH PLACEMENT N/A 06/28/2020   Procedure: INSERTION PORT-A-CATH;  Surgeon: Benjamine Sprague, DO;  Location: [facility] ORS;   Service: General;  Laterality: N/A;  . TUBAL LIGATION    . VIDEO ASSISTED THORACOSCOPY (VATS)/THOROCOTOMY Right 06/23/2020   Procedure: ATTEMPTED VIDEO ASSISTED THORACOSCOPY (VATS);  Surgeon: Nestor Lewandowsky, MD;  Location: [facility] ORS;  Service: General;  Laterality: Right;    Social History   Socioeconomic History  . Marital status: Single    Spouse name: Not on file  . Number of children: Not on file  . Years of education: Not on file  . Highest education level: Not on file  Occupational History  . Not on file  Tobacco Use  . Smoking status: Never Smoker  . Smokeless tobacco: Never Used  Substance and Sexual Activity  . Alcohol use: Not Currently    Alcohol/week: 1.0 standard drink    Types: 1 Cans of beer per week  . Drug use: No  . Sexual activity: Not on file  Other Topics Concern  . Not on file  Social History Narrative  . Not on file   Social Determinants of Health   Financial Resource Strain: Not on file  Food Insecurity: Not on file  Transportation Needs: Not on file  Physical Activity: Not on file  Stress: Not on file  Social Connections: Not on file  Intimate Partner Violence: Not on file    Family History  Problem Relation Age of Onset  . Diabetes Other   . Hypertension Other   . Diabetes Maternal Aunt   . Breast cancer Cousin        dx 69s  . Breast cancer Cousin        dx 41s     Current Outpatient Medications:  .  calcium carbonate (CALCIUM 600) 600 MG TABS tablet, Take 2 tablets (1,200 mg total) by mouth daily with breakfast. (Patient taking differently: Take 1,200 mg by mouth daily with breakfast. 3 tab QD), Disp: 60 tablet, Rfl: 1 .  cholecalciferol (VITAMIN D3) 25 MCG (1000 UNIT) tablet, TAKE 1 TABLET BY MOUTH EVERY DAY (Patient not taking: No sig reported), Disp: 30 tablet, Rfl: 0 .  FEROSUL 325 (65 Fe) MG tablet, TAKE ONE (1) TABLET BY MOUTH TWO TIMES PER DAY WITH A MEAL, Disp: 60 tablet, Rfl: 1 .  folic acid (FOLVITE) 1 MG tablet, Take 1  tablet (1 mg total) by mouth daily., Disp: 60 tablet, Rfl: 0 .  lidocaine-prilocaine (EMLA) cream, Apply 1 application topically as needed. Place a small amount of cream to port site prior to each chemo treatment, 1-2 hours before treatment., Disp: 30 g, Rfl: 0 .  magnesium chloride (SLOW-MAG) 64 MG TBEC SR tablet, Take 1 tablet (64 mg total) by mouth daily., Disp: 30 tablet, Rfl: 1 .  potassium chloride SA (KLOR-CON) 20 MEQ tablet, Take 2 tablets (40 mEq total) by mouth daily., Disp: 14 tablet, Rfl: 0 .  chlorpheniramine-HYDROcodone (TUSSIONEX) 10-8 MG/5ML SUER, Take 5 mLs by mouth every 12 (twelve) hours as needed for cough. (Patient not taking: No sig reported), Disp: 115 mL, Rfl: 0 .  pantoprazole (PROTONIX) 40 MG tablet, TAKE 1 TABLET BY MOUTH EVERY DAY (Patient not taking: No sig reported), Disp: 30 tablet, Rfl: 0 No current facility-administered medications for this visit.  Facility-Administered Medications Ordered in Other Visits:  .  0.9 %  sodium chloride infusion, , Intravenous, Once, Earlie Server, MD  Physical exam:  Vitals:   09/22/20 0936  BP: 138/83  Pulse: (!) 130  Resp: 18  Temp: 99.2 F (37.3 C)  Weight: 176 lb 6.4 oz (80 kg)   Physical Exam Constitutional:      General: She is not in acute distress.    Comments: Thin built  HENT:     Head: Normocephalic and atraumatic.  Eyes:     General: No scleral icterus. Cardiovascular:     Rate and Rhythm: Normal rate and regular rhythm.     Heart sounds: Normal heart sounds.  Pulmonary:     Effort: Pulmonary effort is normal. No respiratory distress.     Breath sounds: No wheezing.     Comments: Decreased breath sound on the right. Abdominal:     General: Bowel sounds are normal. There is no distension.     Palpations: Abdomen is soft.  Musculoskeletal:        General: No deformity. Normal range of motion.     Cervical back: Normal range of motion and neck supple.     Comments: Bilateral ankle edema  Skin:    General:  Skin is warm and dry.     Findings: No erythema or rash.     Comments: + Left anterior chest wall Mediport.  Neurological:     Mental Status: She is alert and oriented to person, place, and time. Mental status is at baseline.     Cranial Nerves: No cranial nerve deficit.     Coordination: Coordination normal.  Psychiatric:        Mood and Affect: Mood normal.   Large right breast mass with skin involvement.  CMP Latest Ref Rng & Units 09/22/2020  Glucose 70 - 99 mg/dL 115(H)  BUN 6 - 20 mg/dL 9  Creatinine 0.44 - 1.00 mg/dL 0.59  Sodium 135 - 145 mmol/L 140  Potassium 3.5 - 5.1 mmol/L 3.4(L)  Chloride 98 - 111 mmol/L 104  CO2 22 - 32 mmol/L 25  Calcium 8.9 - 10.3 mg/dL 8.6(L)  Total Protein 6.5 - 8.1 g/dL 7.2  Total Bilirubin 0.3 - 1.2 mg/dL 0.6  Alkaline Phos 38 - 126 U/L 73  AST 15 - 41 U/L 28  ALT 0 - 44 U/L 13   CBC Latest Ref Rng & Units 09/22/2020  WBC 4.0 - 10.5 K/uL 9.0  Hemoglobin 12.0 - 15.0 g/dL 10.8(L)  Hematocrit 36.0 - 46.0 % 33.2(L)  Platelets 150 - 400 K/uL 395    RADIOGRAPHIC STUDIES: I have personally reviewed the radiological images as listed and agreed with the findings in the report. DG Chest 2 View  Result Date: 09/13/2020 CLINICAL DATA:  Metastatic breast cancer.  COVID-19. EXAM: CHEST - 2 VIEW COMPARISON:  June 28, 2020. FINDINGS: Stable cardiomediastinal silhouette. Left subclavian Port-A-Cath is unchanged in position. No pneumothorax is noted. Large right perihilar mass is noted concerning for metastatic disease. Mild right pleural effusion is noted. Right upper and lower lobe airspace opacities are noted concerning for pneumonia. Stable left basilar atelectasis or infiltrate is noted. Bony thorax is unremarkable. IMPRESSION: Large right perihilar mass is noted concerning for metastatic disease. Right upper and lower lobe airspace opacities are noted concerning for pneumonia. Mild right pleural effusion is noted. Electronically Signed   By: Marijo Conception M.D.   On: 09/13/2020 16:45     Assessment and plan Patient is a 55 y.o. female presents for follow-up of metastatic triple negative breast cancer 1. Metastatic breast cancer ([place])   2. Encounter for antineoplastic chemotherapy   3. Bone metastasis ([place])  4. Pancreatic lesion   5. Tachycardia    #Stage IV metastatic triple negative breast cancer.  On palliative chemotherapy. Labs are reviewed and discussed with patient counts acceptable to proceed with Taxol treatment today.  Tumor markers CA 15-3 slightly increased, CA 27.29 slightly decreased.  Labs are reviewed and discussed with patient Hold off chemotherapy due to tachycardia and abnormal EKG.  #Tachycardia and prolonged QTc Probably multifactorial.  Taxol is associated with prolonged QTC.  Hold chemotherapy.  Potassium is 3.4, magnesium came back after she left the clinic and no capacity today for IV magnesium at the cancer center..  Magnesium is 1.5.  I called the patient and recommend patient to start Slow-Mag 35m daily.  Also start potassium chloride 40 mEq daily repeat labs next week. Patient reports not using any antiemetics and I recommend patient to hold off antiemetics at this point.  Discussed about low-dose beta-blocker and patient prefers to wait until next week to see if any improvement. Patient has 2D echo scheduled next week.  Refer to cardiology Dr. ESaunders Revelfor further evaluation.  Discussed with Dr.End about her case today.    #Pancreatic lesion, metastasis versus inflammatory process versus primary pancreatic lesion.  CA 19-9 is negative.  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   #Hypocalcemia, patient is taking calcium 1500 mg daily.  Calcium level has improved. # Iron deficiency anemia s/p IV venofer x 2 Continue oral iron supplementation ferrous sulfate 325 mg twice daily.   Hemoglobin stable at 10.4. #Leg swelling, no DVT.  Recommend leg elevation, discussed about compression stocking.   follow up in  1 week.   ZEarlie Server MD, PhD Hematology [facility] HospitalPager- [phone]/05/2021

## 2020-09-23 ENCOUNTER — Other Ambulatory Visit: Payer: Self-pay

## 2020-09-23 ENCOUNTER — Inpatient Hospital Stay: Payer: Medicaid Other

## 2020-09-23 VITALS — BP 154/82 | HR 114 | Temp 98.2°F | Resp 17

## 2020-09-23 DIAGNOSIS — C50919 Malignant neoplasm of unspecified site of unspecified female breast: Secondary | ICD-10-CM

## 2020-09-23 DIAGNOSIS — Z5111 Encounter for antineoplastic chemotherapy: Secondary | ICD-10-CM | POA: Diagnosis not present

## 2020-09-23 MED ORDER — SODIUM CHLORIDE 0.9% FLUSH
10.0000 mL | INTRAVENOUS | Status: DC | PRN
  Administered 2020-09-23: 10 mL via INTRAVENOUS
  Filled 2020-09-23: qty 10

## 2020-09-23 MED ORDER — MAGNESIUM SULFATE 2 GM/50ML IV SOLN
2.0000 g | Freq: Once | INTRAVENOUS | Status: AC
  Administered 2020-09-23: 2 g via INTRAVENOUS
  Filled 2020-09-23: qty 50

## 2020-09-23 MED ORDER — SODIUM CHLORIDE 0.9 % IV SOLN
Freq: Once | INTRAVENOUS | Status: AC
  Filled 2020-09-23: qty 250

## 2020-09-23 MED ORDER — HEPARIN SOD (PORK) LOCK FLUSH 100 UNIT/ML IV SOLN
500.0000 [IU] | Freq: Once | INTRAVENOUS | Status: AC
  Administered 2020-09-23: 500 [IU] via INTRAVENOUS
  Filled 2020-09-23: qty 5

## 2020-09-23 NOTE — Progress Notes (Signed)
Patient tolerated infusion well in fluid clinic today, no concerns voiced. Patient discharged. Stable.

## 2020-09-26 ENCOUNTER — Ambulatory Visit (INDEPENDENT_AMBULATORY_CARE_PROVIDER_SITE_OTHER): Payer: Medicaid Other | Admitting: Cardiology

## 2020-09-26 ENCOUNTER — Telehealth: Payer: Self-pay | Admitting: Cardiology

## 2020-09-26 ENCOUNTER — Other Ambulatory Visit: Payer: Self-pay

## 2020-09-26 ENCOUNTER — Encounter: Payer: Self-pay | Admitting: Cardiology

## 2020-09-26 VITALS — BP 140/82 | HR 138 | Ht 70.0 in | Wt 179.5 lb

## 2020-09-26 DIAGNOSIS — R000 Tachycardia, unspecified: Secondary | ICD-10-CM | POA: Diagnosis not present

## 2020-09-26 DIAGNOSIS — R0602 Shortness of breath: Secondary | ICD-10-CM

## 2020-09-26 MED ORDER — METOPROLOL SUCCINATE ER 25 MG PO TB24: 25.0000 mg | ORAL_TABLET | Freq: Every day | ORAL | 5 refills | Status: DC

## 2020-09-26 NOTE — Patient Instructions (Signed)
Medication Instructions:   Your physician has recommended you make the following change in your medication:   1.  START Toprol XL (Metoprolol Succinate) 25 MG: Take one tab by mouth daily.  *If you need a refill on your cardiac medications before your next appointment, please call your pharmacy*   Lab Work:  None ordered    Testing/Procedures:  1.  Your physician has requested that you have an echocardiogram. Echocardiography is a painless test that uses sound waves to create images of your heart. It provides your doctor with information about the size and shape of your heart and how well your heart's chambers and valves are working. This procedure takes approximately one hour. There are no restrictions for this procedure.    Follow-Up: At [facility], you and your health needs are our priority.  As part of our continuing mission to provide you with exceptional heart care, we have created designated Provider Care Teams.  These Care Teams include your primary Cardiologist (physician) and Advanced Practice Providers (APPs -  Physician Assistants and Nurse Practitioners) who all work together to provide you with the care you need, when you need it.  We recommend signing up for the patient portal called "MyChart".  Sign up information is provided on this After Visit Summary.  MyChart is used to connect with patients for Virtual Visits (Telemedicine).  Patients are able to view lab/test results, encounter notes, upcoming appointments, etc.  Non-urgent messages can be sent to your provider as well.   To learn more about what you can do with MyChart, go to [email or web address].    Your next appointment:   Follow up after Echo   The format for your next appointment:   In Person  Provider:   Kate Sable, MD   Other Instructions

## 2020-09-26 NOTE — Progress Notes (Signed)
Cardiology Office Note:    Date:  09/26/2020   ID:  Jessica Willis, DOB [birth year], MRN [id number]  PCP:  [place]  Cardiologist:  No primary care provider on file.  Advanced Practice Provider:  No care team member to display Electrophysiologist:  None    Referring MD: Earlie Server, MD   Chief Complaint  Patient presents with  . New Patient (Initial Visit)    Ref by Dr. Tasia Catchings for tachycardia. Medications reviewed by the patient verbally.     History of Present Illness:    Jessica Willis is a 55 y.o. female with a hx of anemia, metastatic breast cancer on palliative chemotherapy, who presents due to tachycardia and shortness of breath.  She was admitted to the hospital November 2021 due to shortness of breath and respiratory failure.  CT showed right pleural effusion, cytology was positive for adenocarcinoma with breast origin.  Follows up with oncology.  Work-up has shown skeletal metastases to the cervical spine.  Currently on chemotherapy.  EKG obtained during last visit showed tachycardia and prolonged QTC.  Labs obtained by oncology revealed magnesium 1.5, potassium 3.4.  Started on potassium and magnesium.    Recently admitted last month with Covid related infection and also pneumonia.  She has no shortness of breath with exertion, denies palpitations. States appetite is okay, tolerating her medications without any adverse effects.  Past Medical History:  Diagnosis Date  . Anemia   . Family history of breast cancer   . Patient denies medical problems     Past Surgical History:  Procedure Laterality Date  . BREAST BIOPSY  06/28/2020   Procedure: BREAST BIOPSY;  Surgeon: Benjamine Sprague, DO;  Location: [facility] ORS;  Service: General;;  . PORTACATH PLACEMENT N/A 06/28/2020   Procedure: INSERTION PORT-A-CATH;  Surgeon: Benjamine Sprague, DO;  Location: [facility] ORS;  Service: General;  Laterality: N/A;  . TUBAL LIGATION     . VIDEO ASSISTED THORACOSCOPY (VATS)/THOROCOTOMY Right 06/23/2020   Procedure: ATTEMPTED VIDEO ASSISTED THORACOSCOPY (VATS);  Surgeon: Nestor Lewandowsky, MD;  Location: [facility] ORS;  Service: General;  Laterality: Right;    Current Medications: Current Meds  Medication Sig  . calcium carbonate (CALCIUM 600) 600 MG TABS tablet Take 2 tablets (1,200 mg total) by mouth daily with breakfast. (Patient taking differently: Take 1,200 mg by mouth daily with breakfast. 3 tab QD)  . FEROSUL 325 (65 Fe) MG tablet TAKE ONE (1) TABLET BY MOUTH TWO TIMES PER DAY WITH A MEAL  . lidocaine-prilocaine (EMLA) cream Apply 1 application topically as needed. Place a small amount of cream to port site prior to each chemo treatment, 1-2 hours before treatment.  . magnesium chloride (SLOW-MAG) 64 MG TBEC SR tablet Take 1 tablet (64 mg total) by mouth daily.  . potassium chloride SA (KLOR-CON) 20 MEQ tablet Take 2 tablets (40 mEq total) by mouth daily.     Allergies:   Patient has no known allergies.   Social History   Socioeconomic History  . Marital status: Single    Spouse name: Not on file  . Number of children: Not on file  . Years of education: Not on file  . Highest education level: Not on file  Occupational History  . Not on file  Tobacco Use  . Smoking status: Never Smoker  . Smokeless tobacco: Never Used  Vaping Use  . Vaping Use: Never used  Substance and Sexual Activity  . Alcohol use: Not Currently  Alcohol/week: 1.0 standard drink    Types: 1 Cans of beer per week  . Drug use: No  . Sexual activity: Not on file  Other Topics Concern  . Not on file  Social History Narrative  . Not on file   Social Determinants of Health   Financial Resource Strain: Not on file  Food Insecurity: Not on file  Transportation Needs: Not on file  Physical Activity: Not on file  Stress: Not on file  Social Connections: Not on file     Family History: The patient's family history includes Breast cancer  in her cousin and cousin; Diabetes in her maternal aunt and another family member; Hypertension in an other family member.  ROS:   Please see the history of present illness.     All other systems reviewed and are negative.  EKGs/Labs/Other Studies Reviewed:    The following studies were reviewed today:   EKG:  EKG is  ordered today.  The ekg ordered today demonstrates sinus tachycardia heart rate 38  Recent Labs: 06/15/2020: B Natriuretic Peptide 49.6 09/22/2020: ALT 13; BUN 9; Creatinine, Ser 0.59; Hemoglobin 10.8; Magnesium 1.5; Platelets 395; Potassium 3.4; Sodium 140; TSH 1.470  Recent Lipid Panel No results found for: CHOL, TRIG, HDL, CHOLHDL, VLDL, LDLCALC, LDLDIRECT   Risk Assessment/Calculations:      Physical Exam:    VS:  BP 140/82 (BP Location: Left Arm, Patient Position: Sitting, Cuff Size: Normal)   Pulse (!) 138   Ht 5\' 10"  (1.778 m)   Wt 179 lb 8 oz (81.4 kg)   LMP 06/16/2015 Comment: Tubal Ligation   SpO2 95%   BMI 25.76 kg/m     Wt Readings from Last 3 Encounters:  09/26/20 179 lb 8 oz (81.4 kg)  09/22/20 176 lb 6.4 oz (80 kg)  09/15/20 178 lb 8 oz (81 kg)     GEN:  Well nourished, well developed in no acute distress HEENT: Normal NECK: No JVD; No carotid bruits LYMPHATICS: No lymphadenopathy CARDIAC: Tachycardic, no murmurs RESPIRATORY: Decreased breath sounds at bases ABDOMEN: Soft, non-tender, non-distended MUSCULOSKELETAL: Right chest wound dressing noted, left chest Chemo-Port noted SKIN: Warm and dry NEUROLOGIC:  Alert and oriented x 3 PSYCHIATRIC:  Normal affect   ASSESSMENT:    1. Tachycardia   2. Shortness of breath    PLAN:    In order of problems listed above:  1. Tachycardia possibly from cancer treatment induced arrhythmia (CTIA) as patient is on Taxol which is known to cause arrhythmias, including bradycardia, tachycardia, QTC prolongation. Underlying malignancy and pleural disease could also be contributing. EKG today shows  sinus tachycardia, normal QTC. Start Toprol-XL 25 mg daily to help with patient's symptoms and also prevent tachycardia induced cardiomyopathy. Plan to titrate beta-blocker as needed to control heart rate. Avoid QT prolonging medications if possible, agree with replete electrolytes including K and magnesium. 2. Shortness of breath with exertion, decreased breath sounds at bases, likely from pleural effusions due to metastatic disease. Get echocardiogram to evaluate systolic and diastolic function.  Follow-up after echocardiogram.    Medication Adjustments/Labs and Tests Ordered: Current medicines are reviewed at length with the patient today.  Concerns regarding medicines are outlined above.  Orders Placed This Encounter  Procedures  . EKG 12-Lead  . ECHOCARDIOGRAM COMPLETE   No orders of the defined types were placed in this encounter.   Patient Instructions  Medication Instructions:   Your physician has recommended you make the following change in your medication:   1.  START Toprol XL (Metoprolol Succinate) 25 MG: Take one tab by mouth daily.  *If you need a refill on your cardiac medications before your next appointment, please call your pharmacy*   Lab Work:  None ordered    Testing/Procedures:  1.  Your physician has requested that you have an echocardiogram. Echocardiography is a painless test that uses sound waves to create images of your heart. It provides your doctor with information about the size and shape of your heart and how well your heart's chambers and valves are working. This procedure takes approximately one hour. There are no restrictions for this procedure.    Follow-Up: At [facility], you and your health needs are our priority.  As part of our continuing mission to provide you with exceptional heart care, we have created designated Provider Care Teams.  These Care Teams include your primary Cardiologist (physician) and Advanced Practice Providers  (APPs -  Physician Assistants and Nurse Practitioners) who all work together to provide you with the care you need, when you need it.  We recommend signing up for the patient portal called "MyChart".  Sign up information is provided on this After Visit Summary.  MyChart is used to connect with patients for Virtual Visits (Telemedicine).  Patients are able to view lab/test results, encounter notes, upcoming appointments, etc.  Non-urgent messages can be sent to your provider as well.   To learn more about what you can do with MyChart, go to [email or web address].    Your next appointment:   Follow up after Echo   The format for your next appointment:   In Person  Provider:   Kate Sable, MD   Other Instructions      Signed, Kate Sable, MD  09/26/2020 1:01 PM    [place]

## 2020-09-26 NOTE — Telephone Encounter (Signed)
Called patient and informed her prescription has been sent in.

## 2020-09-26 NOTE — Telephone Encounter (Signed)
Patient calling  States that she is to start metoprolol medication but prescription has not been sent in  Please send to [facility]

## 2020-09-27 ENCOUNTER — Ambulatory Visit: Payer: Medicaid Other

## 2020-09-30 ENCOUNTER — Inpatient Hospital Stay (HOSPITAL_BASED_OUTPATIENT_CLINIC_OR_DEPARTMENT_OTHER): Payer: Medicaid Other | Admitting: Oncology

## 2020-09-30 ENCOUNTER — Inpatient Hospital Stay: Payer: Medicaid Other

## 2020-09-30 ENCOUNTER — Encounter: Payer: Self-pay | Admitting: Oncology

## 2020-09-30 ENCOUNTER — Other Ambulatory Visit: Payer: Self-pay

## 2020-09-30 VITALS — BP 129/75 | HR 122 | Temp 97.2°F | Resp 18 | Wt 171.1 lb

## 2020-09-30 VITALS — BP 144/84 | HR 115

## 2020-09-30 DIAGNOSIS — C50919 Malignant neoplasm of unspecified site of unspecified female breast: Secondary | ICD-10-CM

## 2020-09-30 DIAGNOSIS — R000 Tachycardia, unspecified: Secondary | ICD-10-CM | POA: Diagnosis not present

## 2020-09-30 DIAGNOSIS — C7951 Secondary malignant neoplasm of bone: Secondary | ICD-10-CM

## 2020-09-30 DIAGNOSIS — Z5111 Encounter for antineoplastic chemotherapy: Secondary | ICD-10-CM

## 2020-09-30 DIAGNOSIS — K869 Disease of pancreas, unspecified: Secondary | ICD-10-CM

## 2020-09-30 DIAGNOSIS — E860 Dehydration: Secondary | ICD-10-CM

## 2020-09-30 DIAGNOSIS — D509 Iron deficiency anemia, unspecified: Secondary | ICD-10-CM

## 2020-09-30 LAB — COMPREHENSIVE METABOLIC PANEL
ALT: 14 U/L (ref 0–44)
AST: 51 U/L — ABNORMAL HIGH (ref 15–41)
Albumin: 3.2 g/dL — ABNORMAL LOW (ref 3.5–5.0)
Alkaline Phosphatase: 100 U/L (ref 38–126)
Anion gap: 13 (ref 5–15)
BUN: 15 mg/dL (ref 6–20)
CO2: 22 mmol/L (ref 22–32)
Calcium: 9.1 mg/dL (ref 8.9–10.3)
Chloride: 99 mmol/L (ref 98–111)
Creatinine, Ser: 0.58 mg/dL (ref 0.44–1.00)
GFR, Estimated: >60 mL/min (ref >60)
Glucose, Bld: 120 mg/dL — ABNORMAL HIGH (ref 70–99)
Potassium: 4 mmol/L (ref 3.5–5.1)
Sodium: 134 mmol/L — ABNORMAL LOW (ref 135–145)
Total Bilirubin: 0.6 mg/dL (ref 0.3–1.2)
Total Protein: 7.3 g/dL (ref 6.5–8.1)

## 2020-09-30 LAB — CBC WITH DIFFERENTIAL/PLATELET
Abs Immature Granulocytes: 0.16 10*3/uL — ABNORMAL HIGH (ref 0.00–0.07)
Basophils Absolute: 0.1 10*3/uL (ref 0.0–0.1)
Basophils Relative: 0 %
Eosinophils Absolute: 0 10*3/uL (ref 0.0–0.5)
Eosinophils Relative: 0 %
HCT: 33.9 % — ABNORMAL LOW (ref 36.0–46.0)
Hemoglobin: 10.6 g/dL — ABNORMAL LOW (ref 12.0–15.0)
Immature Granulocytes: 1 %
Lymphocytes Relative: 8 %
Lymphs Abs: 1.4 10*3/uL (ref 0.7–4.0)
MCH: 25.4 pg — ABNORMAL LOW (ref 26.0–34.0)
MCHC: 31.3 g/dL (ref 30.0–36.0)
MCV: 81.1 fL (ref 80.0–100.0)
Monocytes Absolute: 1.2 10*3/uL — ABNORMAL HIGH (ref 0.1–1.0)
Monocytes Relative: 7 %
Neutro Abs: 14.5 10*3/uL — ABNORMAL HIGH (ref 1.7–7.7)
Neutrophils Relative %: 84 %
Platelets: 435 10*3/uL — ABNORMAL HIGH (ref 150–400)
RBC: 4.18 MIL/uL (ref 3.87–5.11)
RDW: 15.9 % — ABNORMAL HIGH (ref 11.5–15.5)
WBC: 17.3 10*3/uL — ABNORMAL HIGH (ref 4.0–10.5)
nRBC: 0 % (ref 0.0–0.2)

## 2020-09-30 LAB — RETIC PANEL
Immature Retic Fract: 16 % — ABNORMAL HIGH (ref 2.3–15.9)
RBC.: 4.19 MIL/uL (ref 3.87–5.11)
Retic Count, Absolute: 45.7 10*3/uL (ref 19.0–186.0)
Retic Ct Pct: 1.1 % (ref 0.4–3.1)
Reticulocyte Hemoglobin: 25.4 pg — ABNORMAL LOW (ref >27.9)

## 2020-09-30 LAB — MAGNESIUM: Magnesium: 2 mg/dL (ref 1.7–2.4)

## 2020-09-30 MED ORDER — HEPARIN SOD (PORK) LOCK FLUSH 100 UNIT/ML IV SOLN
500.0000 [IU] | Freq: Once | INTRAVENOUS | Status: AC
  Administered 2020-09-30: 500 [IU] via INTRAVENOUS
  Filled 2020-09-30: qty 5

## 2020-09-30 MED ORDER — POTASSIUM CHLORIDE CRYS ER 20 MEQ PO TBCR: 40.0000 meq | EXTENDED_RELEASE_TABLET | Freq: Every day | ORAL | 0 refills | Status: DC

## 2020-09-30 MED ORDER — SODIUM CHLORIDE 0.9 % IV SOLN
Freq: Once | INTRAVENOUS | Status: AC
  Filled 2020-09-30: qty 250

## 2020-09-30 MED ORDER — SODIUM CHLORIDE 0.9% FLUSH
10.0000 mL | INTRAVENOUS | Status: DC | PRN
  Administered 2020-09-30: 10 mL via INTRAVENOUS
  Filled 2020-09-30: qty 10

## 2020-09-30 NOTE — Progress Notes (Signed)
Patient here for follow up and possible IVF. Patient was started on metoprolol by cardiologist. Pt did not take metoprolol this morning because it makes her dizzy.

## 2020-09-30 NOTE — Progress Notes (Signed)
Pt received IV hydration today.

## 2020-09-30 NOTE — Progress Notes (Signed)
Hematology/Oncology Follow Up Note [facility]  Telephone:([phone] [phone]  Patient Care Team: [place] as PCP - General Rico Junker, RN as Registered Nurse Benjamine Sprague, DO as Consulting Physician (Surgery)   Name of the patient: Jessica Willis  [id number]  [birth year]   REASON FOR VISIT  follow-up for triple negative breast cancer  INTERVAL HISTORY 55 y.o. with past medical history including anemia, family history of breast cancer presents for follow-up of management of metastatic triple negative breast cancer.  06/15/2020-07/01/2020 Patient was admitted due to shortness of breath.  Also reported to have right breast cancer which she has noticed for about at least 6 months.  Initial x-ray and a CT showed massive right-sided pleural effusion , mass-effect and shift to the midline, near complete complex of the right lung, right breast mass extending to the skin surface associated with skin thickening and right axillary lymphadenopathy. Patient had initially pleural catheter placement later chest tube placement. Patient was admitted to ICU due to respiratory failure.  She was also seen by Dr. Genevive Bi initially and there was an attempt for tac pleurodesis which was not successful.  Chest tube was removed. Patient's respiratory status improved. Initial pleural effusion cytology came back positive for adenocarcinoma compatible with breast origin.  Triple negative due to lack of tissue for additional testing.  Patient admitted breast mass biopsy as well as Mediport placement by Dr. Lysle Pearl.  Patient was given first dose of Taxol 80 mg/m during her admission and she tolerated well. Patient was discharged on 07/01/2020.  Respiratory status improved and she was discharged home without home oxygen.  # MRI brain showed no intracranial brain metastasis.  1 cm metastasis affecting the clivus.  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  NGS Omniseq insight showed AXIN1 G4255, LAMP1 gain, LRP1B E4499, MYC gain, NOTCH-BRD4 fusion. TMB 1.58mt/mb not high.  MS Stable, CPS <1  [employer] genetic testing: Negative  08/18/2020, COVID-19 infection.    INTERVAL HISTORY Jessica Willis a 55y.o. female who has above history reviewed by me today presents for follow up visit for management of triple negative breast cancer Problems and complaints are listed below: Very mild numbness or tingling of her toes.  Patient was seen by cardiology for prolonged QTc and a tachycardia.  Patient was recommended to start metoprolol succinate 25 mg.  Patient reports that she has tried for a few days.  She today she did not take.  Occasionally she feels dizzy. Patient has echocardiogram scheduled next week. EKG was done at cardiologist office and QTC has normalized.    Review of Systems  Constitutional: Positive for appetite change, fatigue and unexpected weight change. Negative for chills and fever.  HENT:   Negative for hearing loss and voice change.   Eyes: Negative for eye problems.  Respiratory: Negative for chest tightness, cough and shortness of breath.   Cardiovascular: Negative for chest pain and leg swelling.  Gastrointestinal: Negative for abdominal distention, abdominal pain and blood in stool.  Endocrine: Negative for hot flashes.  Genitourinary: Negative for difficulty urinating and frequency.   Musculoskeletal: Negative for arthralgias.  Skin: Negative for itching and rash.  Neurological: Negative for extremity weakness.  Hematological: Negative for adenopathy.  Psychiatric/Behavioral: Negative for confusion.      No Known Allergies   Past Medical History:  Diagnosis Date  . Anemia   . Family history of breast cancer   . Patient denies medical problems  Past Surgical History:  Procedure Laterality Date  . BREAST BIOPSY  06/28/2020   Procedure: BREAST BIOPSY;  Surgeon: Benjamine Sprague, DO;  Location:  [facility] ORS;  Service: General;;  . PORTACATH PLACEMENT N/A 06/28/2020   Procedure: INSERTION PORT-A-CATH;  Surgeon: Benjamine Sprague, DO;  Location: [facility] ORS;  Service: General;  Laterality: N/A;  . TUBAL LIGATION    . VIDEO ASSISTED THORACOSCOPY (VATS)/THOROCOTOMY Right 06/23/2020   Procedure: ATTEMPTED VIDEO ASSISTED THORACOSCOPY (VATS);  Surgeon: Nestor Lewandowsky, MD;  Location: [facility] ORS;  Service: General;  Laterality: Right;    Social History   Socioeconomic History  . Marital status: Single    Spouse name: Not on file  . Number of children: Not on file  . Years of education: Not on file  . Highest education level: Not on file  Occupational History  . Not on file  Tobacco Use  . Smoking status: Never Smoker  . Smokeless tobacco: Never Used  Vaping Use  . Vaping Use: Never used  Substance and Sexual Activity  . Alcohol use: Not Currently    Alcohol/week: 1.0 standard drink    Types: 1 Cans of beer per week  . Drug use: No  . Sexual activity: Not on file  Other Topics Concern  . Not on file  Social History Narrative  . Not on file   Social Determinants of Health   Financial Resource Strain: Not on file  Food Insecurity: Not on file  Transportation Needs: Not on file  Physical Activity: Not on file  Stress: Not on file  Social Connections: Not on file  Intimate Partner Violence: Not on file    Family History  Problem Relation Age of Onset  . Diabetes Other   . Hypertension Other   . Diabetes Maternal Aunt   . Breast cancer Cousin        dx 50s  . Breast cancer Cousin        dx 60s     Current Outpatient Medications:  .  calcium carbonate (CALCIUM 600) 600 MG TABS tablet, Take 2 tablets (1,200 mg total) by mouth daily with breakfast. (Patient taking differently: Take 1,200 mg by mouth daily with breakfast. 3 tab QD), Disp: 60 tablet, Rfl: 1 .  FEROSUL 325 (65 Fe) MG tablet, TAKE ONE (1) TABLET BY MOUTH TWO TIMES PER DAY WITH A MEAL, Disp: 60 tablet, Rfl: 1 .   lidocaine-prilocaine (EMLA) cream, Apply 1 application topically as needed. Place a small amount of cream to port site prior to each chemo treatment, 1-2 hours before treatment., Disp: 30 g, Rfl: 0 .  magnesium chloride (SLOW-MAG) 64 MG TBEC SR tablet, Take 1 tablet (64 mg total) by mouth daily., Disp: 30 tablet, Rfl: 1 .  metoprolol succinate (TOPROL XL) 25 MG 24 hr tablet, Take 1 tablet (25 mg total) by mouth daily., Disp: 30 tablet, Rfl: 5 .  potassium chloride SA (KLOR-CON) 20 MEQ tablet, Take 2 tablets (40 mEq total) by mouth daily., Disp: 14 tablet, Rfl: 0 No current facility-administered medications for this visit.  Facility-Administered Medications Ordered in Other Visits:  .  0.9 %  sodium chloride infusion, , Intravenous, Once, Earlie Server, MD, Last Rate: 999 mL/hr at 09/30/20 0909, New Bag at 09/30/20 0909 .  heparin lock flush 100 unit/mL, 500 Units, Intravenous, Once, Earlie Server, MD .  sodium chloride flush (NS) 0.9 % injection 10 mL, 10 mL, Intravenous, PRN, Earlie Server, MD, 10 mL at 09/30/20 5366  Physical exam:  Vitals:  09/30/20 0846  BP: 129/75  Pulse: (!) 122  Resp: 18  Temp: (!) 97.2 F (36.2 C)  SpO2: 95%  Weight: 171 lb 1.6 oz (77.6 kg)   Physical Exam Constitutional:      General: She is not in acute distress.    Comments: Thin built  HENT:     Head: Normocephalic and atraumatic.  Eyes:     General: No scleral icterus. Cardiovascular:     Rate and Rhythm: Normal rate and regular rhythm.     Heart sounds: Normal heart sounds.  Pulmonary:     Effort: Pulmonary effort is normal. No respiratory distress.     Breath sounds: No wheezing.     Comments: Decreased breath sound on the right. Abdominal:     General: Bowel sounds are normal. There is no distension.     Palpations: Abdomen is soft.  Musculoskeletal:        General: No deformity. Normal range of motion.     Cervical back: Normal range of motion and neck supple.     Comments: Bilateral ankle edema   Skin:    General: Skin is warm and dry.     Findings: No erythema or rash.     Comments: + Left anterior chest wall Mediport.  Neurological:     Mental Status: She is alert and oriented to person, place, and time. Mental status is at baseline.     Cranial Nerves: No cranial nerve deficit.     Coordination: Coordination normal.  Psychiatric:        Mood and Affect: Mood normal.   Large right breast mass with skin involvement.  CMP Latest Ref Rng & Units 09/30/2020  Glucose 70 - 99 mg/dL 120(H)  BUN 6 - 20 mg/dL 15  Creatinine 0.44 - 1.00 mg/dL 0.58  Sodium 135 - 145 mmol/L 134(L)  Potassium 3.5 - 5.1 mmol/L 4.0  Chloride 98 - 111 mmol/L 99  CO2 22 - 32 mmol/L 22  Calcium 8.9 - 10.3 mg/dL 9.1  Total Protein 6.5 - 8.1 g/dL 7.3  Total Bilirubin 0.3 - 1.2 mg/dL 0.6  Alkaline Phos 38 - 126 U/L 100  AST 15 - 41 U/L 51(H)  ALT 0 - 44 U/L 14   CBC Latest Ref Rng & Units 09/30/2020  WBC 4.0 - 10.5 K/uL 17.3(H)  Hemoglobin 12.0 - 15.0 g/dL 10.6(L)  Hematocrit 36.0 - 46.0 % 33.9(L)  Platelets 150 - 400 K/uL 435(H)    RADIOGRAPHIC STUDIES: I have personally reviewed the radiological images as listed and agreed with the findings in the report. DG Chest 2 View  Result Date: 09/13/2020 CLINICAL DATA:  Metastatic breast cancer.  COVID-19. EXAM: CHEST - 2 VIEW COMPARISON:  June 28, 2020. FINDINGS: Stable cardiomediastinal silhouette. Left subclavian Port-A-Cath is unchanged in position. No pneumothorax is noted. Large right perihilar mass is noted concerning for metastatic disease. Mild right pleural effusion is noted. Right upper and lower lobe airspace opacities are noted concerning for pneumonia. Stable left basilar atelectasis or infiltrate is noted. Bony thorax is unremarkable. IMPRESSION: Large right perihilar mass is noted concerning for metastatic disease. Right upper and lower lobe airspace opacities are noted concerning for pneumonia. Mild right pleural effusion is noted.  Electronically Signed   By: Marijo Conception M.D.   On: 09/13/2020 16:45     Assessment and plan Patient is a 55 y.o. female presents for follow-up of metastatic triple negative breast cancer 1. Metastatic breast cancer ([place])   2. Encounter  for antineoplastic chemotherapy   3. Hypomagnesemia   4. Tachycardia   5. Bone metastasis ([place])   6. 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  On palliative chemotherapy. Labs reviewed and discussed with patient Hold chemotherapy due to tachycardia.  Awaiting echocardiogram.   #Tachycardia and prolonged QTc Persistent tachycardia.  Continue metoprolol succinate per recommendation of cardiology. Prolonged QTC interval has normalized. I recommend patient to continue slow magnesium 1 tablet daily and potassium chloride 40 mEq daily.  Refills were sent to pharmacy.    #Pancreatic lesion, metastasis versus inflammatory process versus primary pancreatic lesion.  CA 19-9 is negative.  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 #Bone metastasis, continue monthly Zometa.   #Hypocalcemia, continues calcium 1500 mg daily.. # Iron deficiency anemia s/p IV venofer x 2.  Continue oral iron supplementation.  Hemoglobin is 10.6. #Leg swelling, no DVT.  Recommend leg elevation, discussed about compression stocking.   follow up in  1 week.   Earlie Server, MD, PhD Hematology Oncology [facility] Pager- [phone] 09/30/2020

## 2020-09-30 NOTE — Addendum Note (Signed)
Addended by: Earlie Server on: 09/30/2020 08:30 PM   Modules accepted: Orders

## 2020-10-06 ENCOUNTER — Telehealth: Payer: Self-pay

## 2020-10-06 ENCOUNTER — Ambulatory Visit: Payer: Medicaid Other

## 2020-10-06 ENCOUNTER — Other Ambulatory Visit: Payer: Self-pay

## 2020-10-06 DIAGNOSIS — R000 Tachycardia, unspecified: Secondary | ICD-10-CM

## 2020-10-06 DIAGNOSIS — I10 Essential (primary) hypertension: Secondary | ICD-10-CM

## 2020-10-06 MED ORDER — METOPROLOL TARTRATE 12.5 MG HALF TABLET
50.0000 mg | ORAL_TABLET | Freq: Once | ORAL | Status: AC
  Administered 2020-10-06: 50 mg via ORAL

## 2020-10-06 MED ORDER — METOPROLOL TARTRATE 50 MG PO TABS: 50.0000 mg | ORAL_TABLET | Freq: Two times a day (BID) | ORAL | 3 refills | Status: AC

## 2020-10-06 NOTE — Telephone Encounter (Signed)
Patient was here for an echocardiogram and her HR was 140. Dr. Garen Lah ordered 1 dose of Metoprolol Tartrate 50 MG to be given now. Echo was cancelled and rescheduled. Dr. Garen Lah ordered Metoprolol Tartrate 50 MG BID, and to cancel her Toprol XL.  Called patient and left a detailed VM per DPR on file, and also sent her a MyChart message with these instructions.

## 2020-10-06 NOTE — Progress Notes (Unsigned)
   Patient ID: Jessica Willis, female    DOB: 07-24-1966, 55 y.o.   MRN: [id number]  Patient arrived for complete TTE and exam was started. The beginning of the protocol was complete, including all PLAX images and measurements. Dr Garen Lah was then consulted due to prolonged HR's around 140 bpm. Dr canceled today's echo and requested that the patient be rescheduled. The patient c/o significant dyspnea that she felt began when she started Metroprolol several days previous     Review of Systems    Physical Exam

## 2020-10-07 ENCOUNTER — Inpatient Hospital Stay (HOSPITAL_BASED_OUTPATIENT_CLINIC_OR_DEPARTMENT_OTHER): Payer: Medicaid Other | Admitting: Oncology

## 2020-10-07 ENCOUNTER — Encounter: Payer: Self-pay | Admitting: Emergency Medicine

## 2020-10-07 ENCOUNTER — Emergency Department: Payer: Medicaid Other

## 2020-10-07 ENCOUNTER — Other Ambulatory Visit: Payer: Self-pay

## 2020-10-07 ENCOUNTER — Inpatient Hospital Stay: Payer: Medicaid Other

## 2020-10-07 ENCOUNTER — Encounter: Payer: Self-pay | Admitting: Oncology

## 2020-10-07 ENCOUNTER — Inpatient Hospital Stay
Admission: EM | Admit: 2020-10-07 | Discharge: 2020-10-21 | DRG: 871 | Disposition: A | Discharge status: Home Health | Payer: Medicaid Other | Attending: Internal Medicine | Admitting: Internal Medicine

## 2020-10-07 VITALS — BP 129/78 | HR 129 | Temp 99.5°F | Resp 20

## 2020-10-07 DIAGNOSIS — Z79899 Other long term (current) drug therapy: Secondary | ICD-10-CM | POA: Diagnosis not present

## 2020-10-07 DIAGNOSIS — R0603 Acute respiratory distress: Secondary | ICD-10-CM | POA: Diagnosis not present

## 2020-10-07 DIAGNOSIS — L0390 Cellulitis, unspecified: Secondary | ICD-10-CM

## 2020-10-07 DIAGNOSIS — K869 Disease of pancreas, unspecified: Secondary | ICD-10-CM | POA: Diagnosis not present

## 2020-10-07 DIAGNOSIS — Z5111 Encounter for antineoplastic chemotherapy: Secondary | ICD-10-CM | POA: Diagnosis present

## 2020-10-07 DIAGNOSIS — C782 Secondary malignant neoplasm of pleura: Secondary | ICD-10-CM | POA: Diagnosis present

## 2020-10-07 DIAGNOSIS — A419 Sepsis, unspecified organism: Secondary | ICD-10-CM | POA: Diagnosis present

## 2020-10-07 DIAGNOSIS — T451X5D Adverse effect of antineoplastic and immunosuppressive drugs, subsequent encounter: Secondary | ICD-10-CM | POA: Diagnosis not present

## 2020-10-07 DIAGNOSIS — J90 Pleural effusion, not elsewhere classified: Secondary | ICD-10-CM | POA: Diagnosis not present

## 2020-10-07 DIAGNOSIS — J189 Pneumonia, unspecified organism: Secondary | ICD-10-CM | POA: Diagnosis present

## 2020-10-07 DIAGNOSIS — I82409 Acute embolism and thrombosis of unspecified deep veins of unspecified lower extremity: Secondary | ICD-10-CM

## 2020-10-07 DIAGNOSIS — E43 Unspecified severe protein-calorie malnutrition: Secondary | ICD-10-CM | POA: Diagnosis present

## 2020-10-07 DIAGNOSIS — C50911 Malignant neoplasm of unspecified site of right female breast: Secondary | ICD-10-CM | POA: Diagnosis present

## 2020-10-07 DIAGNOSIS — C50919 Malignant neoplasm of unspecified site of unspecified female breast: Secondary | ICD-10-CM

## 2020-10-07 DIAGNOSIS — D509 Iron deficiency anemia, unspecified: Secondary | ICD-10-CM | POA: Diagnosis present

## 2020-10-07 DIAGNOSIS — R000 Tachycardia, unspecified: Secondary | ICD-10-CM | POA: Diagnosis present

## 2020-10-07 DIAGNOSIS — R140 Abdominal distension (gaseous): Secondary | ICD-10-CM

## 2020-10-07 DIAGNOSIS — T451X5A Adverse effect of antineoplastic and immunosuppressive drugs, initial encounter: Secondary | ICD-10-CM | POA: Diagnosis not present

## 2020-10-07 DIAGNOSIS — C7800 Secondary malignant neoplasm of unspecified lung: Secondary | ICD-10-CM | POA: Diagnosis present

## 2020-10-07 DIAGNOSIS — R110 Nausea: Secondary | ICD-10-CM | POA: Diagnosis not present

## 2020-10-07 DIAGNOSIS — Z515 Encounter for palliative care: Secondary | ICD-10-CM | POA: Diagnosis not present

## 2020-10-07 DIAGNOSIS — J910 Malignant pleural effusion: Secondary | ICD-10-CM | POA: Diagnosis not present

## 2020-10-07 DIAGNOSIS — K219 Gastro-esophageal reflux disease without esophagitis: Secondary | ICD-10-CM | POA: Diagnosis present

## 2020-10-07 DIAGNOSIS — Z6825 Body mass index (BMI) 25.0-25.9, adult: Secondary | ICD-10-CM | POA: Diagnosis not present

## 2020-10-07 DIAGNOSIS — C7951 Secondary malignant neoplasm of bone: Secondary | ICD-10-CM | POA: Diagnosis present

## 2020-10-07 DIAGNOSIS — B370 Candidal stomatitis: Secondary | ICD-10-CM | POA: Diagnosis not present

## 2020-10-07 DIAGNOSIS — Y95 Nosocomial condition: Secondary | ICD-10-CM | POA: Diagnosis present

## 2020-10-07 DIAGNOSIS — Z833 Family history of diabetes mellitus: Secondary | ICD-10-CM

## 2020-10-07 DIAGNOSIS — D75838 Other thrombocytosis: Secondary | ICD-10-CM | POA: Diagnosis present

## 2020-10-07 DIAGNOSIS — Z7189 Other specified counseling: Secondary | ICD-10-CM | POA: Diagnosis not present

## 2020-10-07 DIAGNOSIS — Z803 Family history of malignant neoplasm of breast: Secondary | ICD-10-CM

## 2020-10-07 DIAGNOSIS — J9601 Acute respiratory failure with hypoxia: Secondary | ICD-10-CM

## 2020-10-07 DIAGNOSIS — Z171 Estrogen receptor negative status [ER-]: Secondary | ICD-10-CM | POA: Diagnosis not present

## 2020-10-07 DIAGNOSIS — Z8616 Personal history of COVID-19: Secondary | ICD-10-CM | POA: Diagnosis not present

## 2020-10-07 DIAGNOSIS — R6520 Severe sepsis without septic shock: Secondary | ICD-10-CM | POA: Diagnosis present

## 2020-10-07 DIAGNOSIS — M7989 Other specified soft tissue disorders: Secondary | ICD-10-CM | POA: Diagnosis not present

## 2020-10-07 DIAGNOSIS — D72829 Elevated white blood cell count, unspecified: Secondary | ICD-10-CM | POA: Diagnosis not present

## 2020-10-07 DIAGNOSIS — R630 Anorexia: Secondary | ICD-10-CM | POA: Diagnosis not present

## 2020-10-07 DIAGNOSIS — Z66 Do not resuscitate: Secondary | ICD-10-CM | POA: Diagnosis not present

## 2020-10-07 DIAGNOSIS — E876 Hypokalemia: Secondary | ICD-10-CM

## 2020-10-07 DIAGNOSIS — Z20822 Contact with and (suspected) exposure to covid-19: Secondary | ICD-10-CM | POA: Diagnosis present

## 2020-10-07 DIAGNOSIS — D649 Anemia, unspecified: Secondary | ICD-10-CM | POA: Diagnosis not present

## 2020-10-07 LAB — COMPREHENSIVE METABOLIC PANEL
ALT: 22 U/L (ref 0–44)
ALT: 21 U/L (ref 0–44)
AST: 71 U/L — ABNORMAL HIGH (ref 15–41)
AST: 69 U/L — ABNORMAL HIGH (ref 15–41)
Albumin: 2.9 g/dL — ABNORMAL LOW (ref 3.5–5.0)
Albumin: 2.9 g/dL — ABNORMAL LOW (ref 3.5–5.0)
Alkaline Phosphatase: 132 U/L — ABNORMAL HIGH (ref 38–126)
Alkaline Phosphatase: 141 U/L — ABNORMAL HIGH (ref 38–126)
Anion gap: 13 (ref 5–15)
Anion gap: 9 (ref 5–15)
BUN: 17 mg/dL (ref 6–20)
BUN: 16 mg/dL (ref 6–20)
CO2: 23 mmol/L (ref 22–32)
CO2: 25 mmol/L (ref 22–32)
Calcium: 8.9 mg/dL (ref 8.9–10.3)
Calcium: 9.2 mg/dL (ref 8.9–10.3)
Chloride: 101 mmol/L (ref 98–111)
Chloride: 104 mmol/L (ref 98–111)
Creatinine, Ser: 0.7 mg/dL (ref 0.44–1.00)
Creatinine, Ser: 0.61 mg/dL (ref 0.44–1.00)
GFR, Estimated: >60 mL/min (ref >60)
GFR, Estimated: >60 mL/min (ref >60)
Glucose, Bld: 123 mg/dL — ABNORMAL HIGH (ref 70–99)
Glucose, Bld: 121 mg/dL — ABNORMAL HIGH (ref 70–99)
Potassium: 4.3 mmol/L (ref 3.5–5.1)
Potassium: 4.3 mmol/L (ref 3.5–5.1)
Sodium: 137 mmol/L (ref 135–145)
Sodium: 138 mmol/L (ref 135–145)
Total Bilirubin: 0.5 mg/dL (ref 0.3–1.2)
Total Bilirubin: 0.5 mg/dL (ref 0.3–1.2)
Total Protein: 7.2 g/dL (ref 6.5–8.1)
Total Protein: 7 g/dL (ref 6.5–8.1)

## 2020-10-07 LAB — CBC WITH DIFFERENTIAL/PLATELET
Abs Immature Granulocytes: 0.47 10*3/uL — ABNORMAL HIGH (ref 0.00–0.07)
Abs Immature Granulocytes: 0.56 10*3/uL — ABNORMAL HIGH (ref 0.00–0.07)
Basophils Absolute: 0.1 10*3/uL (ref 0.0–0.1)
Basophils Absolute: 0.1 10*3/uL (ref 0.0–0.1)
Basophils Relative: 0 %
Basophils Relative: 0 %
Eosinophils Absolute: 0.1 10*3/uL (ref 0.0–0.5)
Eosinophils Absolute: 0 10*3/uL (ref 0.0–0.5)
Eosinophils Relative: 0 %
Eosinophils Relative: 0 %
HCT: 34.5 % — ABNORMAL LOW (ref 36.0–46.0)
HCT: 36.1 % (ref 36.0–46.0)
Hemoglobin: 10.7 g/dL — ABNORMAL LOW (ref 12.0–15.0)
Hemoglobin: 11 g/dL — ABNORMAL LOW (ref 12.0–15.0)
Immature Granulocytes: 2 %
Immature Granulocytes: 3 %
Lymphocytes Relative: 7 %
Lymphocytes Relative: 6 %
Lymphs Abs: 1.5 10*3/uL (ref 0.7–4.0)
Lymphs Abs: 1.2 10*3/uL (ref 0.7–4.0)
MCH: 25.2 pg — ABNORMAL LOW (ref 26.0–34.0)
MCH: 24.9 pg — ABNORMAL LOW (ref 26.0–34.0)
MCHC: 31 g/dL (ref 30.0–36.0)
MCHC: 30.5 g/dL (ref 30.0–36.0)
MCV: 81.2 fL (ref 80.0–100.0)
MCV: 81.7 fL (ref 80.0–100.0)
Monocytes Absolute: 1.2 10*3/uL — ABNORMAL HIGH (ref 0.1–1.0)
Monocytes Absolute: 1.1 10*3/uL — ABNORMAL HIGH (ref 0.1–1.0)
Monocytes Relative: 5 %
Monocytes Relative: 5 %
Neutro Abs: 19.8 10*3/uL — ABNORMAL HIGH (ref 1.7–7.7)
Neutro Abs: 19.3 10*3/uL — ABNORMAL HIGH (ref 1.7–7.7)
Neutrophils Relative %: 86 %
Neutrophils Relative %: 86 %
Platelets: 412 10*3/uL — ABNORMAL HIGH (ref 150–400)
Platelets: 414 10*3/uL — ABNORMAL HIGH (ref 150–400)
RBC: 4.25 MIL/uL (ref 3.87–5.11)
RBC: 4.42 MIL/uL (ref 3.87–5.11)
RDW: 16.1 % — ABNORMAL HIGH (ref 11.5–15.5)
RDW: 16.2 % — ABNORMAL HIGH (ref 11.5–15.5)
WBC: 23.1 10*3/uL — ABNORMAL HIGH (ref 4.0–10.5)
WBC: 22.3 10*3/uL — ABNORMAL HIGH (ref 4.0–10.5)
nRBC: 0 % (ref 0.0–0.2)
nRBC: 0 % (ref 0.0–0.2)

## 2020-10-07 LAB — PROTIME-INR
INR: 1.3 — ABNORMAL HIGH (ref 0.8–1.2)
Prothrombin Time: 15.9 seconds — ABNORMAL HIGH (ref 11.4–15.2)

## 2020-10-07 LAB — RESP PANEL BY RT-PCR (FLU A&B, COVID) ARPGX2
Influenza A by PCR: NEGATIVE
Influenza B by PCR: NEGATIVE
SARS Coronavirus 2 by RT PCR: NEGATIVE

## 2020-10-07 LAB — SEDIMENTATION RATE: Sed Rate: 32 mm/hr — ABNORMAL HIGH (ref 0–30)

## 2020-10-07 LAB — PROCALCITONIN: Procalcitonin: 87.91 ng/mL

## 2020-10-07 LAB — APTT: aPTT: 36 seconds (ref 24–36)

## 2020-10-07 LAB — C-REACTIVE PROTEIN: CRP: 10.7 mg/dL — ABNORMAL HIGH (ref <1.0)

## 2020-10-07 LAB — LACTIC ACID, PLASMA
Lactic Acid, Venous: 2.3 mmol/L (ref 0.5–1.9)
Lactic Acid, Venous: 1.5 mmol/L (ref 0.5–1.9)
Lactic Acid, Venous: 2.4 mmol/L (ref 0.5–1.9)

## 2020-10-07 IMAGING — CT CT ANGIO CHEST
2 of 6 series · 16 of 46 positions shown · IV contrast (APPLIED)
Comparison: CT angiogram chest [DATE]; chest radiograph
[DATE]

CLINICAL DATA: Breast carcinoma. Tachycardia and shortness of
breath

EXAM:
CT ANGIOGRAPHY CHEST WITH CONTRAST
TECHNIQUE: Multidetector CT imaging of the chest was performed using the
standard protocol during bolus administration of intravenous
contrast. Multiplanar CT image reconstructions and MIPs were
obtained to evaluate the vascular anatomy.
CONTRAST:  60mL OMNIPAQUE IOHEXOL 350 MG/ML SOLN

[Series 5: thins · axial · 0.62mm/px · z∈[-208,+46]mm · 13 of 279 slices shown]
[im 13/279  lung]
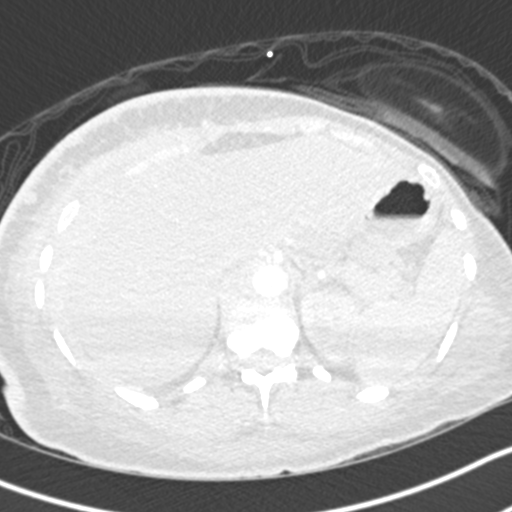
[im 37/279  soft-tissue]
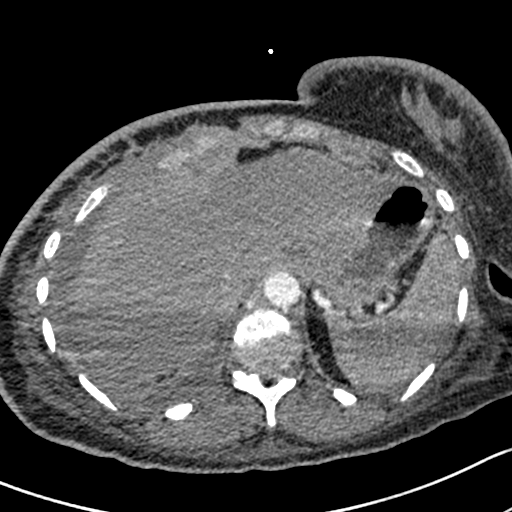
[im 61/279  lung]
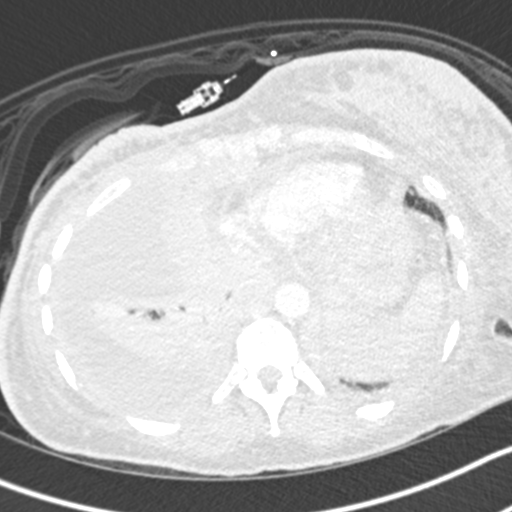
[im 73/279  soft-tissue]
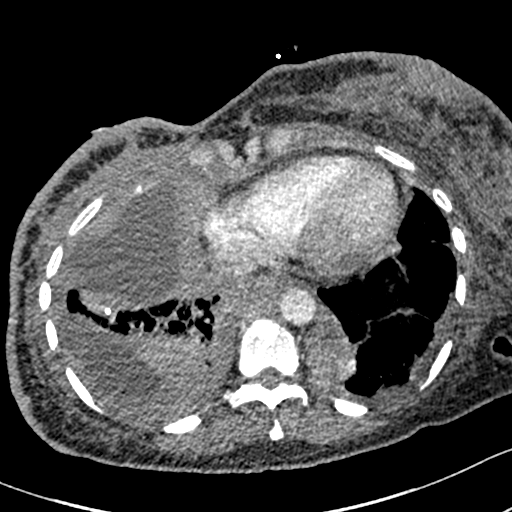
[im 97/279  lung]
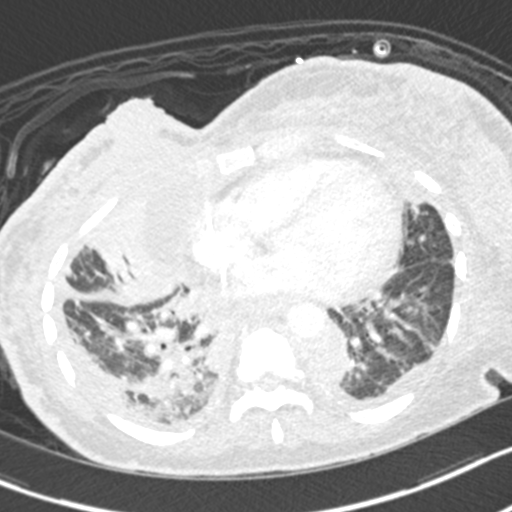
[im 121/279  soft-tissue]
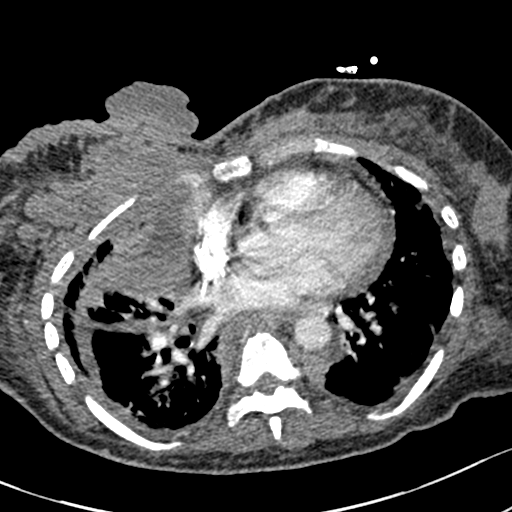
[im 146/279  lung]
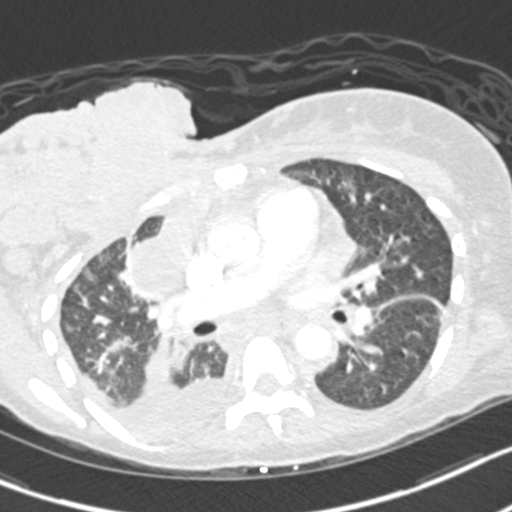
[im 158/279  soft-tissue]
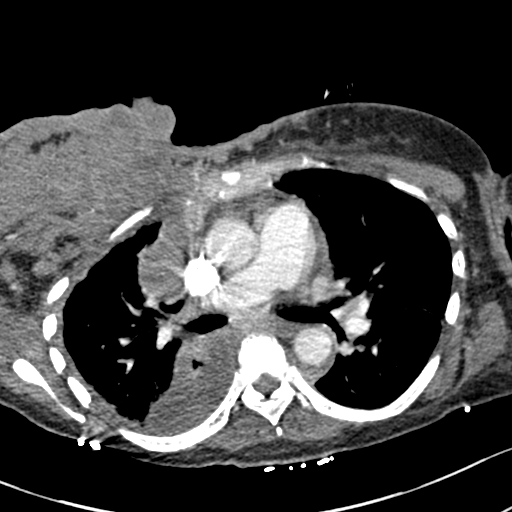
[im 182/279  lung]
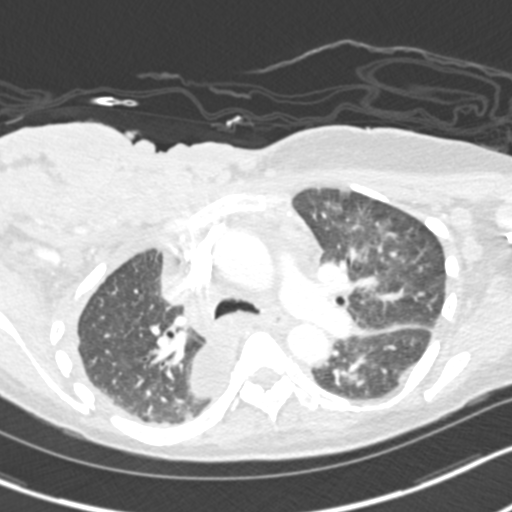
[im 206/279  soft-tissue]
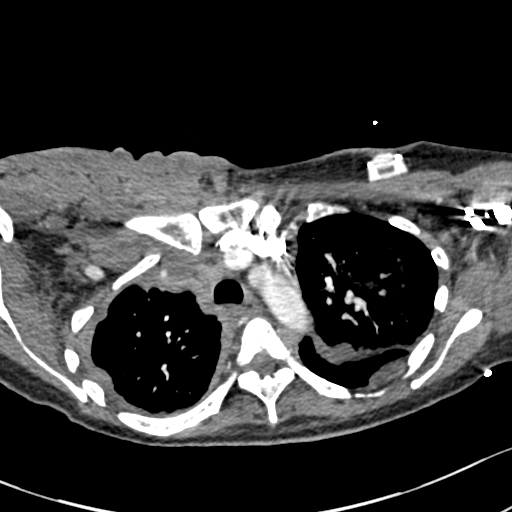
[im 218/279  lung]
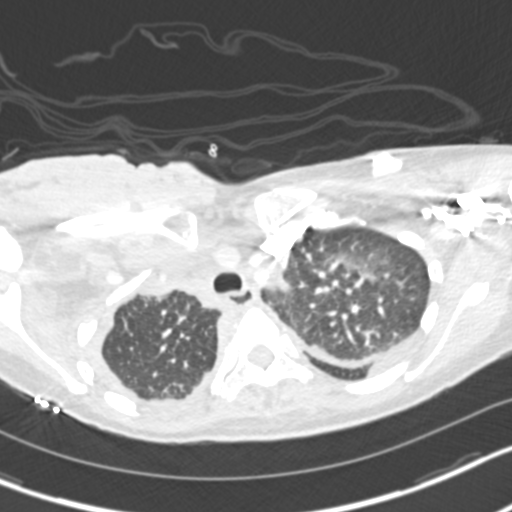
[im 242/279  soft-tissue]
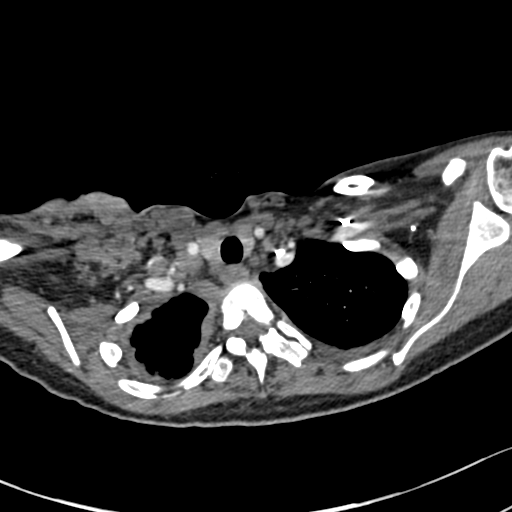
[im 266/279  lung]
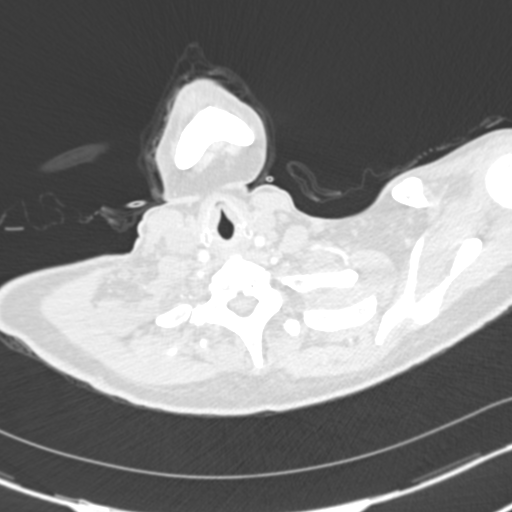

[Series 7: coronal mpr · coronal · 0.56mm/px · 3 of 105 slices shown]
[im 27/105  soft-tissue]
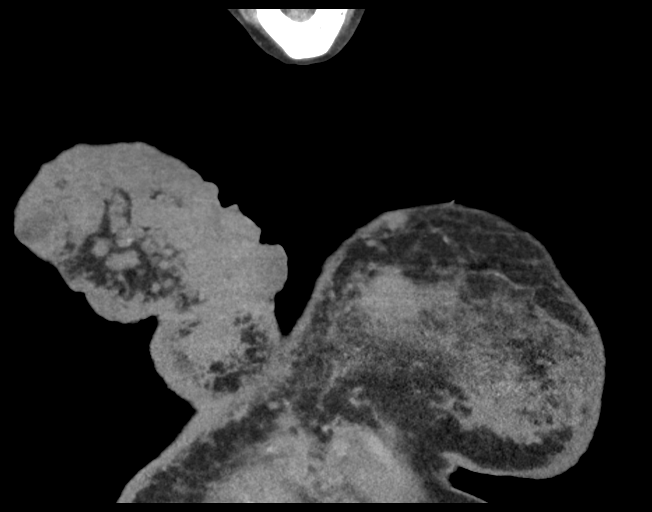
[im 53/105  soft-tissue]
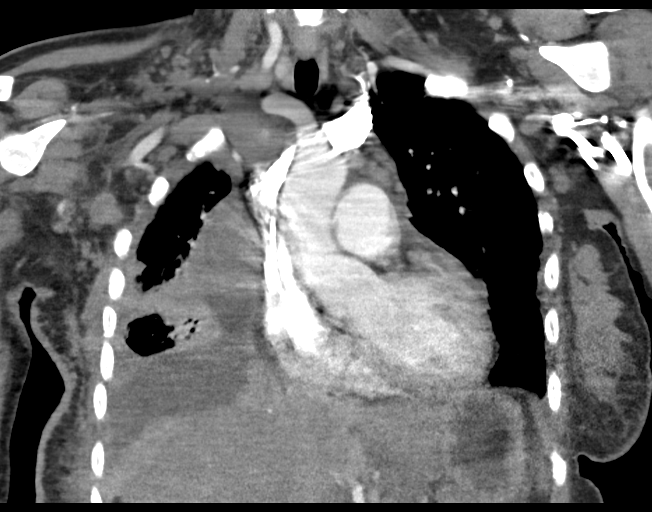
[im 79/105  soft-tissue]
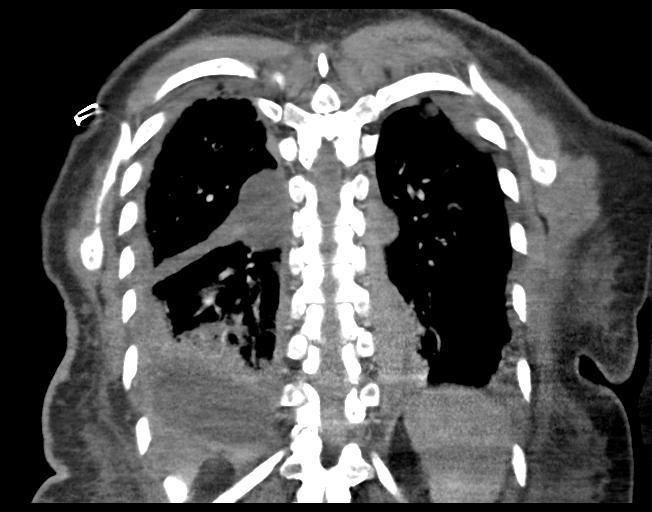

[16 of 46 positions shown; findings below may reference images not displayed]

FINDINGS: Cardiovascular: There is no evident pulmonary embolus. No
appreciable thoracic aortic aneurysm or dissection. Visualized great
vessels appear unremarkable. There is no appreciable pericardial
effusion or pericardial thickening. Port-A-Cath tip is in the
superior vena cava near the cavoatrial junction.

Mediastinum/Nodes: Thyroid appears normal. There are multiple
axillary lymph nodes bilaterally, more severe on the right than on
the left. The largest lymph node is seen in the right axillary
region measuring 2.7 x 2.1 cm. There is adenopathy in the right
supraclavicular and infraclavicular regions with extension of
adenopathy into the right Peri carinal region. Largest individual
lymph node in these areas measures 2.6 x 1.8 cm. Several left
supraclavicular lymph nodes are also noted, largest measuring 1.7 x
1.3 cm. There are several subcentimeter mediastinal lymph nodes.
There is extensive retrocrural adenopathy. The largest lymph node or
collection of matted lymph nodes is seen inferior to the aorta on
the left posteriorly measuring 3.4 x 2.1 cm. No esophageal lesions
are evident.

Lungs/Pleura: There is extensive airspace consolidation in portions
of the right middle and lower lobes with interspersed areas of
loculated pleural effusion. There is ill-defined airspace opacity
consistent with pneumonia in the left upper lobe. Loculated fluid
tracks along the left major fissure. There may well be associated
metastasis within the fissure given the extensive nodularity in this
area. There is a small left pleural effusion with left base
atelectasis. Pleural metastases noted along each upper hemithorax,
primarily posteriorly as well as in the right apex.

Upper Abdomen: Retrocrural adenopathy extends into the upper
abdominal region. Visualized upper abdominal structures otherwise
appear unremarkable.

Musculoskeletal: There is a large mass occupying the right breast
and chest wall region with invasion of the deep muscles of the chest
wall on the right. There is also invasion of the right axilla. There
is extensive subcutaneous thickening in both breast regions. There
is a mass in the lateral left breast with extension of soft tissue
prominence along the lateral left hemithorax, likely due to
extension of neoplasm into the adjacent soft tissues.

There is widespread sclerotic bony metastatic disease throughout the
thoracic region.

Review of the MIP images confirms the above findings.
IMPRESSION: 1. No demonstrable pulmonary embolus. No thoracic aortic aneurysm or
dissection.

2. Areas of airspace consolidation noted in the right middle and
lower lobes. More ill-defined infiltrate is noted in the left upper
lobe anteriorly. Areas of loculated fluid noted in the left major
fissure. Nodularity in this area could indicate superimposed masses
in left major fissure. There is pleural thickening along both
posterior upper hemithorax regions, likely pleural metastases.
Loculated fluid noted in areas of infiltrate on the right. Small
left pleural effusion noted.

3. Adenopathy at multiple sites, most severe in the retrocrural
regions bilaterally, right supraclavicular region, and right
axillary region, although there is left axillary and left
supraclavicular adenopathy.

4. Large mass lesions occupying the right breast with extension and
invasion of the chest wall and right axillary regions. A lesser
degree of metastatic disease to the left of midline in the
subcutaneous tissues and lateral left breast/lateral left chest wall
soft tissues noted.

5. Multifocal sclerotic bony metastatic disease throughout the
thoracic region noted.

## 2020-10-07 IMAGING — CR DG CHEST 2V
1 series · 2 of 2 positions shown · non-contrast
Comparison: [DATE]

CLINICAL DATA: Hypoxia, shortness of breath, metastatic breast
cancer

EXAM:
CHEST - 2 VIEW

[Series 1: dg chest 2 view · 0.14mm/px · 2 of 2 slices shown]
[im 1/2]
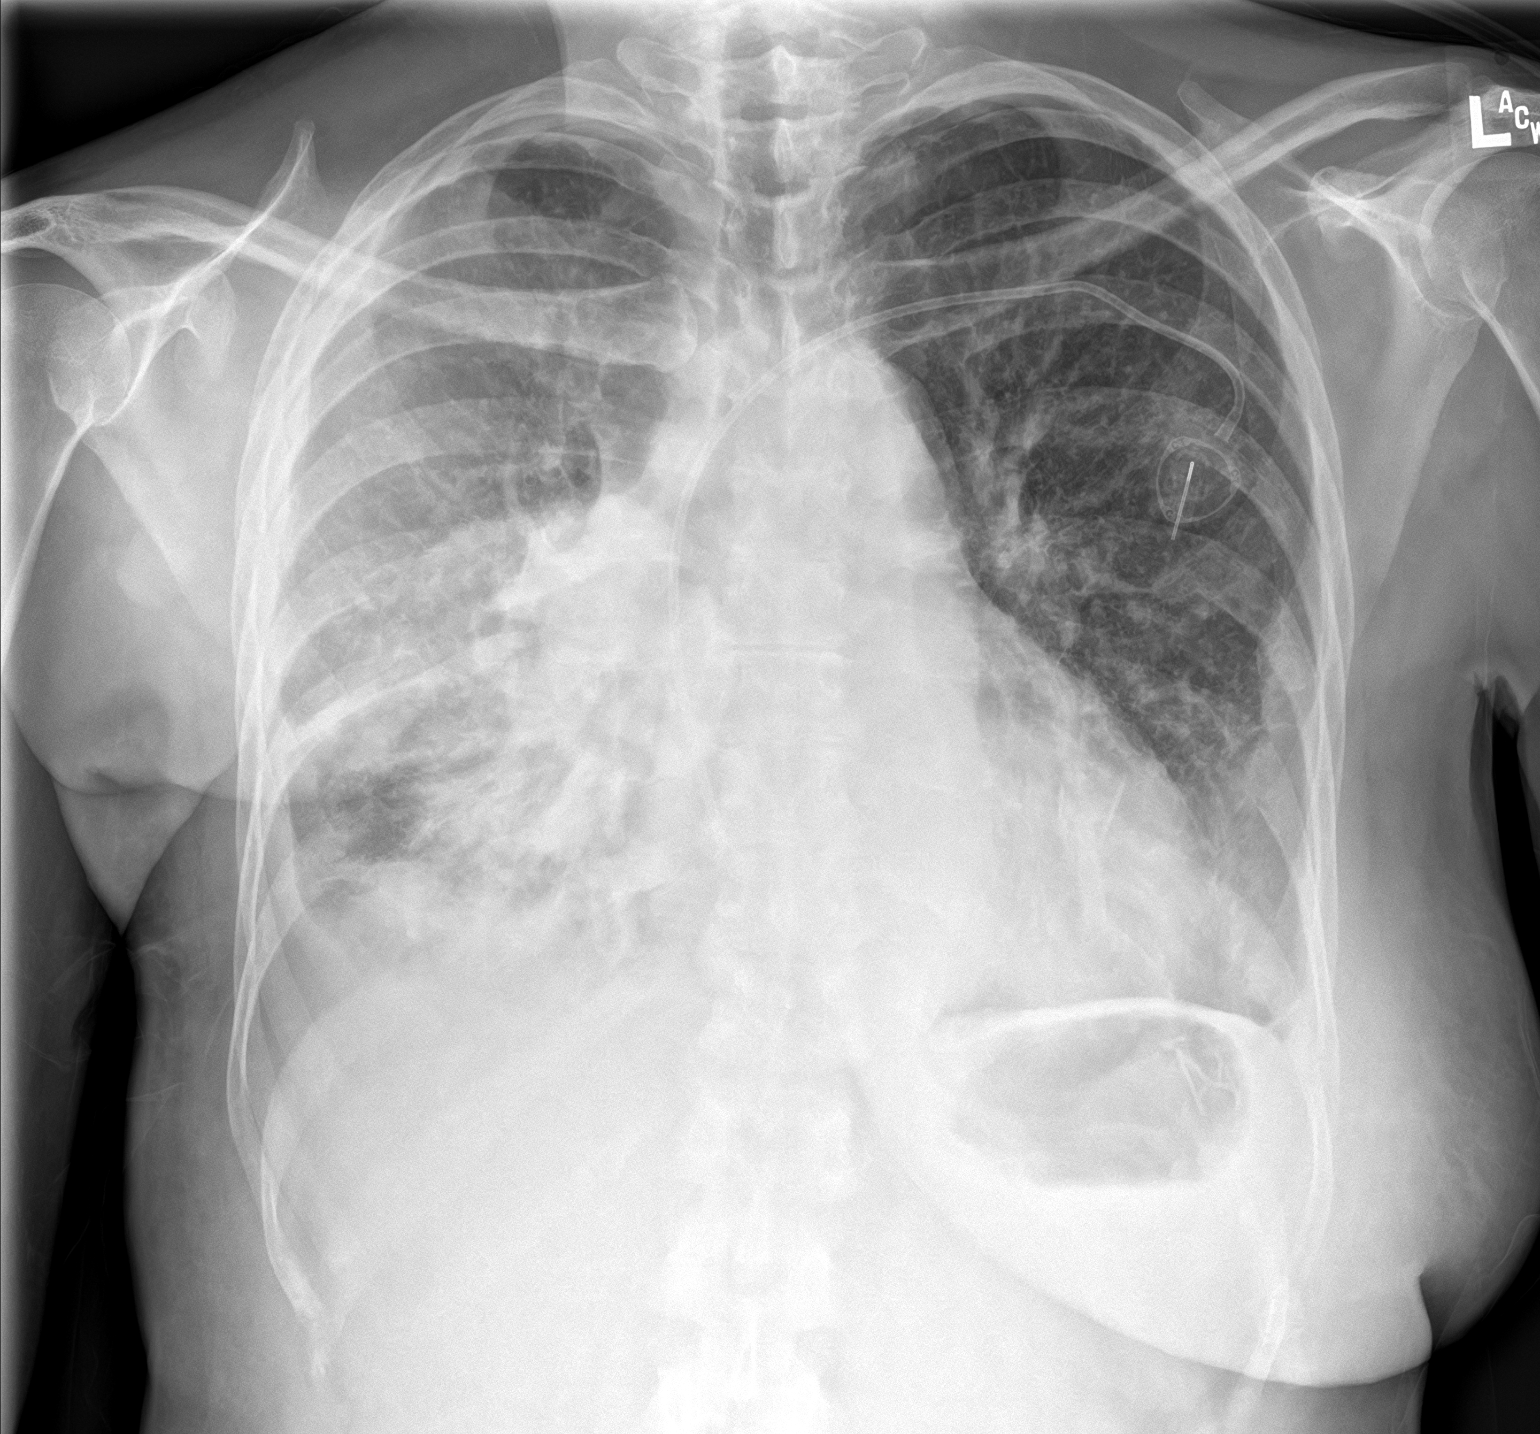
[im 2/2]
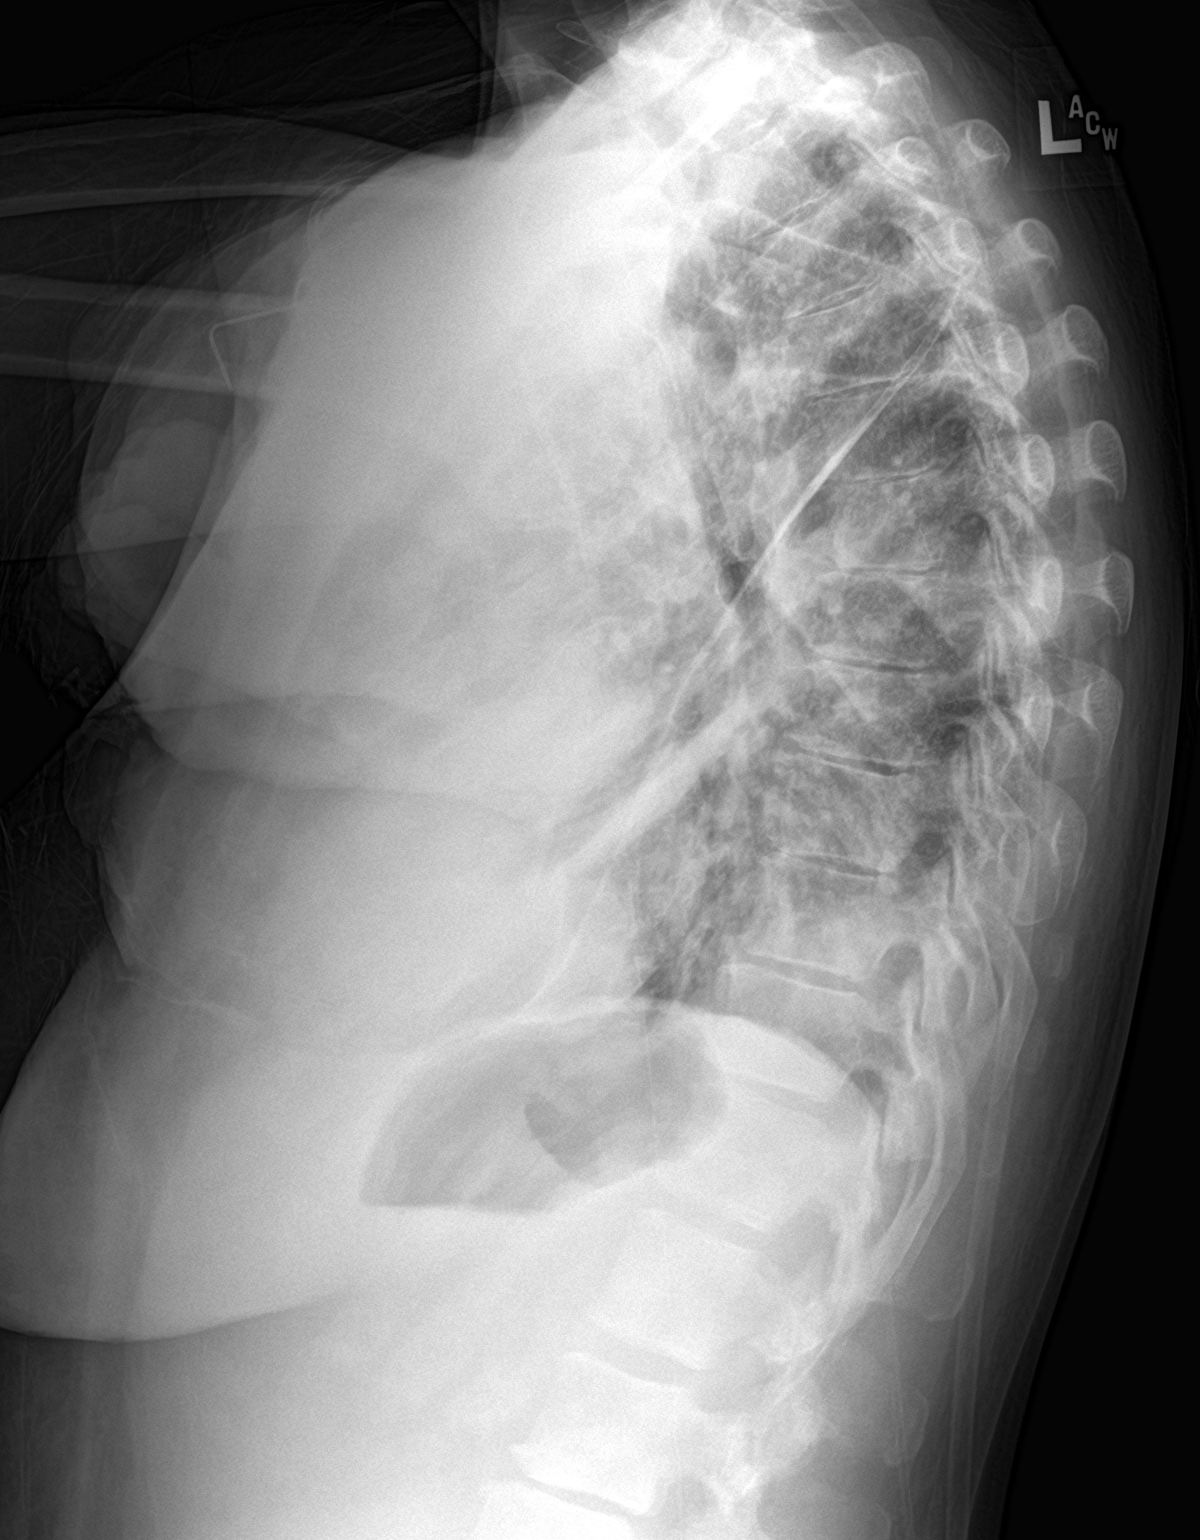

[2 of 2 positions shown; findings below may reference images not displayed]

FINDINGS: LEFT subclavian Port-A-Cath with tip projecting over RIGHT atrium.

Enlargement of cardiac silhouette.

Pulmonary vascular congestion.

Persistent enlargement of RIGHT hilum question mass or adenopathy.

Increased infiltrate of the mid to lower RIGHT lung.

Small RIGHT pleural effusion again seen.

Accentuation of markings in LEFT perihilar region little changed.

No pneumothorax or acute osseous findings.
IMPRESSION: Increased RIGHT perihilar infiltrate with persistent small RIGHT
pleural effusion.

Enlargement of RIGHT pulmonary hilum by adenopathy versus perihilar
mass.

## 2020-10-07 MED ORDER — SODIUM CHLORIDE 0.9 % IV BOLUS (SEPSIS): 1000.0000 mL | Freq: Once | INTRAVENOUS | Status: AC

## 2020-10-07 MED ORDER — DM-GUAIFENESIN ER 30-600 MG PO TB12
1.0000 | ORAL_TABLET | Freq: Two times a day (BID) | ORAL | Status: DC | PRN
  Administered 2020-10-19: 1 via ORAL
  Filled 2020-10-07 (×2): qty 1

## 2020-10-07 MED ORDER — ACETAMINOPHEN 325 MG PO TABS
650.0000 mg | ORAL_TABLET | Freq: Four times a day (QID) | ORAL | Status: DC | PRN
  Administered 2020-10-09: 650 mg via ORAL
  Filled 2020-10-07 (×2): qty 2

## 2020-10-07 MED ORDER — ONDANSETRON HCL 4 MG/2ML IJ SOLN
4.0000 mg | Freq: Three times a day (TID) | INTRAMUSCULAR | Status: DC | PRN
  Administered 2020-10-16 – 2020-10-17 (×2): 4 mg via INTRAVENOUS
  Filled 2020-10-07 (×2): qty 2

## 2020-10-07 MED ORDER — LIDOCAINE-PRILOCAINE 2.5-2.5 % EX CREA
1.0000 "application " | TOPICAL_CREAM | CUTANEOUS | Status: DC | PRN
  Filled 2020-10-07: qty 5

## 2020-10-07 MED ORDER — IOHEXOL 350 MG/ML SOLN
60.0000 mL | Freq: Once | INTRAVENOUS | Status: AC | PRN
  Administered 2020-10-07: 60 mL via INTRAVENOUS

## 2020-10-07 MED ORDER — ENOXAPARIN SODIUM 40 MG/0.4ML ~~LOC~~ SOLN
40.0000 mg | SUBCUTANEOUS | Status: DC
  Administered 2020-10-07 – 2020-10-20 (×14): 40 mg via SUBCUTANEOUS
  Filled 2020-10-07 (×14): qty 0.4

## 2020-10-07 MED ORDER — MAGNESIUM CHLORIDE 64 MG PO TBEC
1.0000 | DELAYED_RELEASE_TABLET | Freq: Every day | ORAL | Status: DC
  Administered 2020-10-07 – 2020-10-21 (×15): 64 mg via ORAL
  Filled 2020-10-07 (×18): qty 1

## 2020-10-07 MED ORDER — ACETAMINOPHEN 500 MG PO TABS
1000.0000 mg | ORAL_TABLET | Freq: Once | ORAL | Status: AC
  Administered 2020-10-07: 1000 mg via ORAL
  Filled 2020-10-07: qty 2

## 2020-10-07 MED ORDER — ALBUTEROL SULFATE HFA 108 (90 BASE) MCG/ACT IN AERS
2.0000 | INHALATION_SPRAY | RESPIRATORY_TRACT | Status: DC | PRN
  Filled 2020-10-07: qty 6.7

## 2020-10-07 MED ORDER — FERROUS SULFATE 325 (65 FE) MG PO TABS
325.0000 mg | ORAL_TABLET | Freq: Two times a day (BID) | ORAL | Status: DC
  Administered 2020-10-08 – 2020-10-21 (×27): 325 mg via ORAL
  Filled 2020-10-07 (×28): qty 1

## 2020-10-07 MED ORDER — VANCOMYCIN HCL IN DEXTROSE 1-5 GM/200ML-% IV SOLN
1000.0000 mg | Freq: Once | INTRAVENOUS | Status: AC
  Administered 2020-10-07: 1000 mg via INTRAVENOUS
  Filled 2020-10-07: qty 200

## 2020-10-07 MED ORDER — SODIUM CHLORIDE 0.9 % IV SOLN: INTRAVENOUS | Status: DC

## 2020-10-07 MED ORDER — SODIUM CHLORIDE 0.9 % IV BOLUS (SEPSIS)
1000.0000 mL | Freq: Once | INTRAVENOUS | Status: AC
  Administered 2020-10-07: 1000 mL via INTRAVENOUS

## 2020-10-07 MED ORDER — SODIUM CHLORIDE 0.9 % IV SOLN
2.0000 g | Freq: Three times a day (TID) | INTRAVENOUS | Status: DC
  Administered 2020-10-07 – 2020-10-11 (×12): 2 g via INTRAVENOUS
  Filled 2020-10-07 (×16): qty 2

## 2020-10-07 MED ORDER — SODIUM CHLORIDE 0.9 % IV SOLN
2.0000 g | Freq: Once | INTRAVENOUS | Status: AC
  Administered 2020-10-07: 2 g via INTRAVENOUS
  Filled 2020-10-07: qty 2

## 2020-10-07 MED ORDER — VANCOMYCIN HCL IN DEXTROSE 1-5 GM/200ML-% IV SOLN
1000.0000 mg | Freq: Two times a day (BID) | INTRAVENOUS | Status: DC
  Administered 2020-10-07 – 2020-10-08 (×2): 1000 mg via INTRAVENOUS
  Filled 2020-10-07 (×3): qty 200

## 2020-10-07 MED ORDER — CALCIUM CARBONATE 1250 (500 CA) MG PO TABS
1250.0000 mg | ORAL_TABLET | Freq: Every day | ORAL | Status: DC
  Administered 2020-10-08 – 2020-10-20 (×13): 1250 mg via ORAL
  Filled 2020-10-07 (×17): qty 1

## 2020-10-07 MED ORDER — METOPROLOL TARTRATE 50 MG PO TABS
50.0000 mg | ORAL_TABLET | Freq: Two times a day (BID) | ORAL | Status: DC
  Administered 2020-10-07 – 2020-10-21 (×27): 50 mg via ORAL
  Filled 2020-10-07 (×28): qty 1

## 2020-10-07 MED ORDER — METRONIDAZOLE IN NACL 5-0.79 MG/ML-% IV SOLN
500.0000 mg | Freq: Once | INTRAVENOUS | Status: AC
  Administered 2020-10-07: 500 mg via INTRAVENOUS
  Filled 2020-10-07: qty 100

## 2020-10-07 NOTE — Progress Notes (Signed)
Hematology/Oncology Follow Up Note [facility]  Telephone:([phone] [phone]  Patient Care Team: [place] as PCP - General Rico Junker, RN as Registered Nurse Benjamine Sprague, DO as Consulting Physician (Surgery)   Name of the patient: Jessica Willis  [id number]  1966/05/18   REASON FOR VISIT  follow-up for triple negative breast cancer  INTERVAL HISTORY 55 y.o. with past medical history including anemia, family history of breast cancer presents for follow-up of management of metastatic triple negative breast cancer.  06/15/2020-07/01/2020 Patient was admitted due to shortness of breath.  Also reported to have right breast cancer which she has noticed for about at least 6 months.  Initial x-ray and a CT showed massive right-sided pleural effusion , mass-effect and shift to the midline, near complete complex of the right lung, right breast mass extending to the skin surface associated with skin thickening and right axillary lymphadenopathy. Patient had initially pleural catheter placement later chest tube placement. Patient was admitted to ICU due to respiratory failure.  She was also seen by Dr. Genevive Bi initially and there was an attempt for tac pleurodesis which was not successful.  Chest tube was removed. Patient's respiratory status improved. Initial pleural effusion cytology came back positive for adenocarcinoma compatible with breast origin.  Triple negative due to lack of tissue for additional testing.  Patient admitted breast mass biopsy as well as Mediport placement by Dr. Lysle Pearl.  Patient was given first dose of Taxol 80 mg/m during her admission and she tolerated well. Patient was discharged on 07/01/2020.  Respiratory status improved and she was discharged home without home oxygen.  # MRI brain showed no intracranial brain metastasis.  1 cm metastasis affecting the clivus.  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  NGS Omniseq insight showed AXIN1 G4255, LAMP1 gain, LRP1B E4499, MYC gain, NOTCH-BRD4 fusion. TMB 1.29mt/mb not high.  MS Stable, CPS <1  [employer] genetic testing: Negative  08/18/2020, COVID-19 infection.    INTERVAL HISTORY BLorijean Husseris a 55y.o. female who has above history reviewed by me today presents for follow up visit for management of triple negative breast cancer Problems and complaints are listed below: Patient reports feeling more shortness of breath today.  Oxygen saturation at room air was in the 80s and was placed on 2 L of oxygen with improvement oxygen to 96%.  Patient reports worsening of tiredness and fatigue.  Also new onset of diarrhea.  She continues to have tachycardia, which is worse than her baseline level now in the heart rate in the 140s.  Patient was recently seen by cardiologist was advised to take metoprolol 50 mg twice daily and she has not tolerated well. She denies any fever, chills.    Review of Systems  Constitutional: Positive for appetite change, fatigue and unexpected weight change. Negative for chills and fever.  HENT:   Negative for hearing loss and voice change.   Eyes: Negative for eye problems.  Respiratory: Positive for shortness of breath. Negative for chest tightness and cough.   Cardiovascular: Negative for chest pain and leg swelling.  Gastrointestinal: Positive for diarrhea. Negative for abdominal distention, abdominal pain and blood in stool.  Endocrine: Negative for hot flashes.  Genitourinary: Negative for difficulty urinating and frequency.   Musculoskeletal: Negative for arthralgias.  Skin: Negative for itching and rash.  Neurological: Negative for extremity weakness.  Hematological: Negative for adenopathy.  Psychiatric/Behavioral: Negative for confusion.      No Known Allergies  Past Medical History:  Diagnosis Date  . Anemia   . Family history of breast cancer   . Patient denies medical problems       Past Surgical History:  Procedure Laterality Date  . BREAST BIOPSY  06/28/2020   Procedure: BREAST BIOPSY;  Surgeon: Benjamine Sprague, DO;  Location: [facility] ORS;  Service: General;;  . PORTACATH PLACEMENT N/A 06/28/2020   Procedure: INSERTION PORT-A-CATH;  Surgeon: Benjamine Sprague, DO;  Location: [facility] ORS;  Service: General;  Laterality: N/A;  . TUBAL LIGATION    . VIDEO ASSISTED THORACOSCOPY (VATS)/THOROCOTOMY Right 06/23/2020   Procedure: ATTEMPTED VIDEO ASSISTED THORACOSCOPY (VATS);  Surgeon: Nestor Lewandowsky, MD;  Location: [facility] ORS;  Service: General;  Laterality: Right;    Social History   Socioeconomic History  . Marital status: Single    Spouse name: Not on file  . Number of children: Not on file  . Years of education: Not on file  . Highest education level: Not on file  Occupational History  . Not on file  Tobacco Use  . Smoking status: Never Smoker  . Smokeless tobacco: Never Used  Vaping Use  . Vaping Use: Never used  Substance and Sexual Activity  . Alcohol use: Not Currently    Alcohol/week: 1.0 standard drink    Types: 1 Cans of beer per week  . Drug use: No  . Sexual activity: Not on file  Other Topics Concern  . Not on file  Social History Narrative  . Not on file   Social Determinants of Health   Financial Resource Strain: Not on file  Food Insecurity: Not on file  Transportation Needs: Not on file  Physical Activity: Not on file  Stress: Not on file  Social Connections: Not on file  Intimate Partner Violence: Not on file    Family History  Problem Relation Age of Onset  . Diabetes Other   . Hypertension Other   . Diabetes Maternal Aunt   . Breast cancer Cousin        dx 93s  . Breast cancer Cousin        dx 40s    No current facility-administered medications for this visit.  Current Outpatient Medications:  .  calcium carbonate (CALCIUM 600) 600 MG TABS tablet, Take 2 tablets (1,200 mg total) by mouth daily with breakfast. (Patient taking  differently: Take 1,200 mg by mouth daily with breakfast. 3 tab QD), Disp: 60 tablet, Rfl: 1 .  FEROSUL 325 (65 Fe) MG tablet, TAKE ONE (1) TABLET BY MOUTH TWO TIMES PER DAY WITH A MEAL, Disp: 60 tablet, Rfl: 1 .  lidocaine-prilocaine (EMLA) cream, Apply 1 application topically as needed. Place a small amount of cream to port site prior to each chemo treatment, 1-2 hours before treatment., Disp: 30 g, Rfl: 0 .  magnesium chloride (SLOW-MAG) 64 MG TBEC SR tablet, Take 1 tablet (64 mg total) by mouth daily., Disp: 30 tablet, Rfl: 1 .  metoprolol tartrate (LOPRESSOR) 50 MG tablet, Take 1 tablet (50 mg total) by mouth 2 (two) times daily., Disp: 180 tablet, Rfl: 3 .  potassium chloride SA (KLOR-CON) 20 MEQ tablet, Take 2 tablets (40 mEq total) by mouth daily., Disp: 60 tablet, Rfl: 0  Facility-Administered Medications Ordered in Other Visits:  .  0.9 %  sodium chloride infusion, , Intravenous, Continuous, Ivor Costa, MD .  acetaminophen (TYLENOL) tablet 650 mg, 650 mg, Oral, Q6H PRN, Ivor Costa, MD .  albuterol (VENTOLIN HFA) 108 (90 Base) MCG/ACT inhaler 2  puff, 2 puff, Inhalation, Q4H PRN, Ivor Costa, MD .  ceFEPIme (MAXIPIME) 2 g in sodium chloride 0.9 % 100 mL IVPB, 2 g, Intravenous, Q8H, Ivor Costa, MD .  dextromethorphan-guaiFENesin ([place] DM) 30-600 MG per 12 hr tablet 1 tablet, 1 tablet, Oral, BID PRN, Ivor Costa, MD .  ondansetron [facility]) injection 4 mg, 4 mg, Intravenous, Q8H PRN, Ivor Costa, MD .  sodium chloride 0.9 % bolus 1,000 mL, 1,000 mL, Intravenous, Once, Ivor Costa, MD .  vancomycin (VANCOCIN) IVPB 1000 mg/200 mL premix, 1,000 mg, Intravenous, Once, Harvest Dark, MD, Last Rate: 200 mL/hr at 10/07/20 1340, 1,000 mg at 10/07/20 1340 .  vancomycin (VANCOCIN) IVPB 1000 mg/200 mL premix, 1,000 mg, Intravenous, Q12H, Ivor Costa, MD  Physical exam:  Vitals:   10/07/20 0915  BP: 129/78  Pulse: (!) 129  Resp: 20  Temp: 99.5 F (37.5 C)  SpO2: 95%   Physical  Exam Constitutional:      General: She is not in acute distress.    Comments: Thin built  HENT:     Head: Normocephalic and atraumatic.  Eyes:     General: No scleral icterus. Cardiovascular:     Rate and Rhythm: Normal rate and regular rhythm.     Heart sounds: Normal heart sounds.  Pulmonary:     Effort: Pulmonary effort is normal. No respiratory distress.     Breath sounds: No wheezing.     Comments: Decreased breath sound on the right. Abdominal:     General: Bowel sounds are normal. There is no distension.     Palpations: Abdomen is soft.  Musculoskeletal:        General: No deformity. Normal range of motion.     Cervical back: Normal range of motion and neck supple.     Comments: Bilateral ankle edema  Skin:    General: Skin is warm and dry.     Findings: No erythema or rash.     Comments: + Left anterior chest wall Mediport.  Neurological:     Mental Status: She is alert and oriented to person, place, and time. Mental status is at baseline.     Cranial Nerves: No cranial nerve deficit.     Coordination: Coordination normal.  Psychiatric:        Mood and Affect: Mood normal.   Large right breast mass with skin involvement.  CMP Latest Ref Rng & Units 10/07/2020  Glucose 70 - 99 mg/dL 121(H)  BUN 6 - 20 mg/dL 16  Creatinine 0.44 - 1.00 mg/dL 0.61  Sodium 135 - 145 mmol/L 138  Potassium 3.5 - 5.1 mmol/L 4.3  Chloride 98 - 111 mmol/L 104  CO2 22 - 32 mmol/L 25  Calcium 8.9 - 10.3 mg/dL 9.2  Total Protein 6.5 - 8.1 g/dL 7.0  Total Bilirubin 0.3 - 1.2 mg/dL 0.5  Alkaline Phos 38 - 126 U/L 141(H)  AST 15 - 41 U/L 69(H)  ALT 0 - 44 U/L 21   CBC Latest Ref Rng & Units 10/07/2020  WBC 4.0 - 10.5 K/uL 22.3(H)  Hemoglobin 12.0 - 15.0 g/dL 11.0(L)  Hematocrit 36.0 - 46.0 % 36.1  Platelets 150 - 400 K/uL 414(H)    RADIOGRAPHIC STUDIES: I have personally reviewed the radiological images as listed and agreed with the findings in the report. DG Chest 2 View  Result  Date: 10/07/2020 CLINICAL DATA:  Hypoxia, shortness of breath, metastatic breast cancer EXAM: CHEST - 2 VIEW COMPARISON:  09/13/2020 FINDINGS: LEFT subclavian Port-A-Cath with tip  projecting over RIGHT atrium. Enlargement of cardiac silhouette. Pulmonary vascular congestion. Persistent enlargement of RIGHT hilum question mass or adenopathy. Increased infiltrate of the mid to lower RIGHT lung. Small RIGHT pleural effusion again seen. Accentuation of markings in LEFT perihilar region little changed. No pneumothorax or acute osseous findings. IMPRESSION: Increased RIGHT perihilar infiltrate with persistent small RIGHT pleural effusion. Enlargement of RIGHT pulmonary hilum by adenopathy versus perihilar mass. Electronically Signed   By: Lavonia Dana M.D.   On: 10/07/2020 11:32   DG Chest 2 View  Result Date: 09/13/2020 CLINICAL DATA:  Metastatic breast cancer.  COVID-19. EXAM: CHEST - 2 VIEW COMPARISON:  June 28, 2020. FINDINGS: Stable cardiomediastinal silhouette. Left subclavian Port-A-Cath is unchanged in position. No pneumothorax is noted. Large right perihilar mass is noted concerning for metastatic disease. Mild right pleural effusion is noted. Right upper and lower lobe airspace opacities are noted concerning for pneumonia. Stable left basilar atelectasis or infiltrate is noted. Bony thorax is unremarkable. IMPRESSION: Large right perihilar mass is noted concerning for metastatic disease. Right upper and lower lobe airspace opacities are noted concerning for pneumonia. Mild right pleural effusion is noted. Electronically Signed   By: Marijo Conception M.D.   On: 09/13/2020 16:45   CT Angio Chest PE W and/or Wo Contrast  Result Date: 10/07/2020 CLINICAL DATA:  Breast carcinoma. Tachycardia and shortness of breath EXAM: CT ANGIOGRAPHY CHEST WITH CONTRAST TECHNIQUE: Multidetector CT imaging of the chest was performed using the standard protocol during bolus administration of intravenous contrast. Multiplanar  CT image reconstructions and MIPs were obtained to evaluate the vascular anatomy. CONTRAST:  81mL OMNIPAQUE IOHEXOL 350 MG/ML SOLN COMPARISON:  CT angiogram chest June 15, 2020; chest radiograph October 07, 2020 FINDINGS: Cardiovascular: There is no evident pulmonary embolus. No appreciable thoracic aortic aneurysm or dissection. Visualized great vessels appear unremarkable. There is no appreciable pericardial effusion or pericardial thickening. Port-A-Cath tip is in the superior vena cava near the cavoatrial junction. Mediastinum/Nodes: Thyroid appears normal. There are multiple axillary lymph nodes bilaterally, more severe on the right than on the left. The largest lymph node is seen in the right axillary region measuring 2.7 x 2.1 cm. There is adenopathy in the right supraclavicular and infraclavicular regions with extension of adenopathy into the right Peri carinal region. Largest individual lymph node in these areas measures 2.6 x 1.8 cm. Several left supraclavicular lymph nodes are also noted, largest measuring 1.7 x 1.3 cm. There are several subcentimeter mediastinal lymph nodes. There is extensive retrocrural adenopathy. The largest lymph node or collection of matted lymph nodes is seen inferior to the aorta on the left posteriorly measuring 3.4 x 2.1 cm. No esophageal lesions are evident. Lungs/Pleura: There is extensive airspace consolidation in portions of the right middle and lower lobes with interspersed areas of loculated pleural effusion. There is ill-defined airspace opacity consistent with pneumonia in the left upper lobe. Loculated fluid tracks along the left major fissure. There may well be associated metastasis within the fissure given the extensive nodularity in this area. There is a small left pleural effusion with left base atelectasis. Pleural metastases noted along each upper hemithorax, primarily posteriorly as well as in the right apex. Upper Abdomen: Retrocrural adenopathy extends  into the upper abdominal region. Visualized upper abdominal structures otherwise appear unremarkable. Musculoskeletal: There is a large mass occupying the right breast and chest wall region with invasion of the deep muscles of the chest wall on the right. There is also invasion of the right axilla.  There is extensive subcutaneous thickening in both breast regions. There is a mass in the lateral left breast with extension of soft tissue prominence along the lateral left hemithorax, likely due to extension of neoplasm into the adjacent soft tissues. There is widespread sclerotic bony metastatic disease throughout the thoracic region. Review of the MIP images confirms the above findings. IMPRESSION: 1. No demonstrable pulmonary embolus. No thoracic aortic aneurysm or dissection. 2. Areas of airspace consolidation noted in the right middle and lower lobes. More ill-defined infiltrate is noted in the left upper lobe anteriorly. Areas of loculated fluid noted in the left major fissure. Nodularity in this area could indicate superimposed masses in left major fissure. There is pleural thickening along both posterior upper hemithorax regions, likely pleural metastases. Loculated fluid noted in areas of infiltrate on the right. Small left pleural effusion noted. 3. Adenopathy at multiple sites, most severe in the retrocrural regions bilaterally, right supraclavicular region, and right axillary region, although there is left axillary and left supraclavicular adenopathy. 4. Large mass lesions occupying the right breast with extension and invasion of the chest wall and right axillary regions. A lesser degree of metastatic disease to the left of midline in the subcutaneous tissues and lateral left breast/lateral left chest wall soft tissues noted. 5. Multifocal sclerotic bony metastatic disease throughout the thoracic region noted. Electronically Signed   By: Lowella Grip III M.D.   On: 10/07/2020 13:23     Assessment  and plan Patient is a 55 y.o. female presents for follow-up of metastatic triple negative breast cancer 1. Metastatic breast cancer ([place])   2. Bone metastasis ([place])   3. Encounter for antineoplastic chemotherapy   4. Hypomagnesemia   5. Hypokalemia   6. Acute respiratory failure with hypoxia ([facility])    #Stage IV metastatic triple negative breast cancer.  On palliative chemotherapy. Labs reviewed and discussed with patient Hold chemotherapy due to acute symptoms  #Acute respiratory failure, hypoxia. I will send patient to emergency room for further work-up.  Differential diagnosis include PE, pneumonia, cancer progression.  Etc. I called ER and talked to triage nurse.   #Tachycardia and prolonged QTc Persistent tachycardia.  Prolonged QTC interval has normalized. Continue slow magnesium 1 tablet daily and potassium chloride 40 mEq daily.     #Pancreatic lesion, metastasis versus inflammatory process versus primary pancreatic lesion.  CA 19-9 is negative.  We will follow up this nodule on image  #Bone metastasis, continue monthly Zometa.   #Hypocalcemia, continue calcium 1500 mg daily.. # Iron deficiency anemia s/p IV venofer x 2.  Continue oral iron supplementation.  Hemoglobin is 11 today #Leg swelling, no DVT on ultrasound lower extremity.  Recommend leg elevation, discussed about compression stocking.   follow up  to be determined.  Patient is going to ER for further evaluation.  Earlie Server, MD, PhD Hematology Oncology [facility] Pager- [phone] 10/07/2020

## 2020-10-07 NOTE — ED Triage Notes (Signed)
Pt sent here from cancer center where she was hypoxic 87% on RA. Is on 2L O2 per [place] now satting 97%. Also, was unable to take chemo today due to ST in the 120's. Denies pain, NAD, rise and fall of chest noted, symmetrical even and unlabored.

## 2020-10-07 NOTE — Progress Notes (Signed)
Patient c/o SOB and tachycardia for "a couple of days". Reports she started taking Metoprolol Tartrate 50MG  BID a couple of days ago and feels this is related to the medication. VS obtained. **See VS Flowsheet**, 2L O2 applied. Port accessed with no blood return but flushing well. Peripheral labs obtained. Dr. Tasia Catchings made aware of S/S, VS, and Port.

## 2020-10-07 NOTE — Consult Note (Signed)
Pharmacy Antibiotic Note  Jessica Willis is a 55 y.o. female admitted on 10/07/2020 with pneumonia.  Pharmacy has been consulted for cefepime and vancomycin dosing.  Plan: Cefepime 2g q8H   Patient received vancomycin 1000 mg x 1. Will start vancomycin 1000 mg q12H @1800  so the patient can get a vancomycin load of 2000 mg within 6 hours. Predicted AUC is 472 (Scr 0.8). Goal AUC 400-550. Plan to obtain vancomycin level in 3-4 days if continued. Will order MRSA PCR. If negative will recommend to d/c vancomycin.   Height: 5\' 10"  (177.8 cm) Weight: 77.6 kg (171 lb) IBW/kg (Calculated) : 68.5  Temp (24hrs), Avg:99.5 F (37.5 C), Min:99.4 F (37.4 C), Max:99.5 F (37.5 C)  Recent Labs  Lab 10/07/20 0843 10/07/20 1025 10/07/20 1139  WBC 23.1* 22.3*  --   CREATININE 0.70 0.61  --   LATICACIDVEN  --   --  2.3*    Estimated Creatinine Clearance: 85.9 mL/min (by C-G formula based on SCr of 0.61 mg/dL).    No Known Allergies  Antimicrobials this admission: 2/25 cefepime >>  2/25 vancomycin >>   Dose adjustments this admission: none  Microbiology results: 2/25 BCx: pending  Thank you for allowing pharmacy to be a part of this patients care.  Oswald Hillock 10/07/2020 2:07 PM

## 2020-10-07 NOTE — Progress Notes (Addendum)
Results for Jessica Willis, Jessica Willis (MRN [id number]) as of 10/07/2020 18:12  Ref. Range 10/07/2020 17:46  Lactic Acid, Venous Latest Ref Range: 0.5 - 1.9 mmol/L 2.4 (HH)      Dr Blaine Hamper  Notified.

## 2020-10-07 NOTE — ED Notes (Signed)
Pt transported to Ct.

## 2020-10-07 NOTE — Progress Notes (Signed)
Patient here for follow up. Pt was placed on 2L of oxygen in infusion due to low sat. Pulse ox 96% on 2L.  Pt reports that she has been having shortness of breath, tiredness, diarrhea, dizziness and drowsiness since she started metoprolol. Per pt, she went to get echo yesterday and it had to be rescheduled due to elevated HR (140s). Pt was told to start metoprolol 50 mg BID and she will be starting today.

## 2020-10-07 NOTE — Consult Note (Signed)
PHARMACY -  BRIEF ANTIBIOTIC NOTE   Pharmacy has received consult(s) for sepsis from an ED provider.  The patient's profile has been reviewed for ht/wt/allergies/indication/available labs.    One time order(s) placed for cefepime, vancomycin and flagyl.   Further antibiotics/pharmacy consults should be ordered by admitting physician if indicated.                       Thank you, Oswald Hillock 10/07/2020  11:15 AM

## 2020-10-07 NOTE — Consult Note (Signed)
CODE SEPSIS - PHARMACY COMMUNICATION  **Broad Spectrum Antibiotics should be administered within 1 hour of Sepsis diagnosis**  Time Code Sepsis Called/Page Received: 11:11  Antibiotics Ordered: cefepime and vancomycin  Time of 1st antibiotic administration: 11:53  Additional action taken by pharmacy: Messaged RN. RN was trying to get cultures prior to give abx.   If necessary, Name of Provider/Nurse Contacted: Amy    Oswald Hillock ,PharmD Clinical Pharmacist  10/07/2020  12:15 PM

## 2020-10-07 NOTE — Sepsis Progress Note (Signed)
eLink is following this Code Sepsis. °

## 2020-10-07 NOTE — H&P (Signed)
History and Physical    Jessica Willis BSJ:[id number] DOB: September 30, 1965 DOA: 10/07/2020  Referring MD/NP/PA:   PCP: [place]   Patient coming from:  The patient is coming from home.  At baseline, pt is independent for most of ADL.        Chief Complaint: SOB  HPI: Jessica Willis is a 55 y.o. female with medical history significant of stage IV metastasized breast cancer on chemotherapy, tachycardia, GERD, anemia, malignant pleural effusion, hemothorax, who presents with shortness of breath.  Patient states that she has been having shortness of for several days, which has worsened today.  She was seen by oncologist in clinic today, and was found to have oxygen desaturated to 87% on room air, and tachycardia with heart rate up to 120s.  Patient denies chest pain, cough, shortness breath, fever or chills.  Denies nausea, vomiting, diarrhea or abdominal pain.  No symptoms of UTI. Patient is on weekly chemotherapy, states last week her heart rate was too high to receive chemotherapy. She has a large necrotic tumor in right breast with some foul odor drainage.  ED Course: pt was found to have WBC 22.3, negative Covid PCR, lactic acid 2.3, INR 1.3, PTT 36, electrolytes renal function okay, temperature 99.5, blood pressure 138/80, heart rate 134, RR 20, oxygen saturation 87% on room air, which improved her to 100% on 2 L oxygen.  CT angiogram is negative for PE, but it showed infiltrates in the right middle lobe and right lower lobe.  Patient is admitted to progressive bed as inpatient.  Review of Systems:   General: no fevers, chills, no body weight gain, has poor appetite, has fatigue HEENT: no blurry vision, hearing changes or sore throat Respiratory: has dyspnea, no coughing, wheezing CV: no chest pain, no palpitations GI: no nausea, vomiting, abdominal pain, diarrhea, constipation GU: no dysuria, burning on urination, increased urinary frequency, hematuria  Ext:  no leg edema Neuro: no unilateral weakness, numbness, or tingling, no vision change or hearing loss Skin: has a very large nerotic tumor in right breast MSK: No muscle spasm, no deformity, no limitation of range of movement in spin Heme: No easy bruising.  Travel history: No recent long distant travel.  Allergy: No Known Allergies  Past Medical History:  Diagnosis Date  . Anemia   . Family history of breast cancer   . Patient denies medical problems     Past Surgical History:  Procedure Laterality Date  . BREAST BIOPSY  06/28/2020   Procedure: BREAST BIOPSY;  Surgeon: Benjamine Sprague, DO;  Location: [facility] ORS;  Service: General;;  . PORTACATH PLACEMENT N/A 06/28/2020   Procedure: INSERTION PORT-A-CATH;  Surgeon: Benjamine Sprague, DO;  Location: [facility] ORS;  Service: General;  Laterality: N/A;  . TUBAL LIGATION    . VIDEO ASSISTED THORACOSCOPY (VATS)/THOROCOTOMY Right 06/23/2020   Procedure: ATTEMPTED VIDEO ASSISTED THORACOSCOPY (VATS);  Surgeon: Nestor Lewandowsky, MD;  Location: [facility] ORS;  Service: General;  Laterality: Right;    Social History:  reports that she has never smoked. She has never used smokeless tobacco. She reports previous alcohol use of about 1.0 standard drink of alcohol per week. She reports that she does not use drugs.  Family History:  Family History  Problem Relation Age of Onset  . Diabetes Other   . Hypertension Other   . Diabetes Maternal Aunt   . Breast cancer Cousin        dx 31s  . Breast cancer Cousin  dx 64s     Prior to Admission medications   Medication Sig Start Date End Date Taking? Authorizing Provider  calcium carbonate (CALCIUM 600) 600 MG TABS tablet Take 2 tablets (1,200 mg total) by mouth daily with breakfast. Patient taking differently: Take 1,200 mg by mouth daily with breakfast. 3 tab QD 09/01/20   Earlie Server, MD  FEROSUL 325 (65 Fe) MG tablet TAKE ONE (1) TABLET BY MOUTH TWO TIMES PER DAY WITH A MEAL 08/31/20   Earlie Server, MD   lidocaine-prilocaine (EMLA) cream Apply 1 application topically as needed. Place a small amount of cream to port site prior to each chemo treatment, 1-2 hours before treatment. 07/04/20   Earlie Server, MD  magnesium chloride (SLOW-MAG) 64 MG TBEC SR tablet Take 1 tablet (64 mg total) by mouth daily. 09/22/20   Earlie Server, MD  metoprolol tartrate (LOPRESSOR) 50 MG tablet Take 1 tablet (50 mg total) by mouth 2 (two) times daily. 10/06/20 01/04/21  Kate Sable, MD  potassium chloride SA (KLOR-CON) 20 MEQ tablet Take 2 tablets (40 mEq total) by mouth daily. 09/30/20   Earlie Server, MD    Physical Exam: Vitals:   10/07/20 1500 10/07/20 1607 10/07/20 1804 10/07/20 1804  BP: 121/73 139/81 123/74 123/74  Pulse: (!) 119 (!) 124 (!) 118 (!) 118  Resp: 20 16 20    Temp: 98.1 F (36.7 C) 98.2 F (36.8 C) 98.5 F (36.9 C) 98.5 F (36.9 C)  TempSrc: Oral Oral Oral Oral  SpO2: 98% 97% 96% 97%  Weight:  78 kg    Height:  5\' 10"  (1.778 m)     General: Not in acute distress HEENT:       Eyes: PERRL, EOMI, no scleral icterus.       ENT: No discharge from the ears and nose, no pharynx injection, no tonsillar enlargement.        Neck: No JVD, no bruit, no mass felt. Heme: No neck lymph node enlargement. Cardiac: S1/S2, RRR, No murmurs, No gallops or rubs. Respiratory: Has coarse breathing sound GI: Soft, nondistended, nontender, no rebound pain, no organomegaly, BS present. GU: No hematuria Ext: No pitting leg edema bilaterally. 1+DP/PT pulse bilaterally. Musculoskeletal: No joint deformities, No joint redness or warmth, no limitation of ROM in spin. Skin:  has a very large angry looking and necrotic tumor in right breast with some foul odor drainage. Neuro: Alert, oriented X3, cranial nerves II-XII grossly intact, moves all extremities normally. Psych: Patient is not psychotic, no suicidal or hemocidal ideation.  Labs on Admission: I have personally reviewed following labs and imaging  studies  CBC: Recent Labs  Lab 10/07/20 0843 10/07/20 1025  WBC 23.1* 22.3*  NEUTROABS 19.8* 19.3*  HGB 10.7* 11.0*  HCT 34.5* 36.1  MCV 81.2 81.7  PLT 412* 630*   Basic Metabolic Panel: Recent Labs  Lab 10/07/20 0843 10/07/20 1025  NA 137 138  K 4.3 4.3  CL 101 104  CO2 23 25  GLUCOSE 123* 121*  BUN 17 16  CREATININE 0.70 0.61  CALCIUM 8.9 9.2   GFR: Estimated Creatinine Clearance: 85.9 mL/min (by C-G formula based on SCr of 0.61 mg/dL). Liver Function Tests: Recent Labs  Lab 10/07/20 0843 10/07/20 1025  AST 71* 69*  ALT 22 21  ALKPHOS 132* 141*  BILITOT 0.5 0.5  PROT 7.2 7.0  ALBUMIN 2.9* 2.9*   No results for input(s): LIPASE, AMYLASE in the last 168 hours. No results for input(s): AMMONIA in the last 168  hours. Coagulation Profile: Recent Labs  Lab 10/07/20 1025  INR 1.3*   Cardiac Enzymes: No results for input(s): CKTOTAL, CKMB, CKMBINDEX, TROPONINI in the last 168 hours. BNP (last 3 results) No results for input(s): PROBNP in the last 8760 hours. HbA1C: No results for input(s): HGBA1C in the last 72 hours. CBG: No results for input(s): GLUCAP in the last 168 hours. Lipid Profile: No results for input(s): CHOL, HDL, LDLCALC, TRIG, CHOLHDL, LDLDIRECT in the last 72 hours. Thyroid Function Tests: No results for input(s): TSH, T4TOTAL, FREET4, T3FREE, THYROIDAB in the last 72 hours. Anemia Panel: No results for input(s): VITAMINB12, FOLATE, FERRITIN, TIBC, IRON, RETICCTPCT in the last 72 hours. Urine analysis:    Component Value Date/Time   COLORURINE YELLOW (A) 06/15/2020 2340   APPEARANCEUR CLEAR (A) 06/15/2020 2340   LABSPEC >1.046 (H) 06/15/2020 2340   PHURINE 5.0 06/15/2020 2340   GLUCOSEU NEGATIVE 06/15/2020 2340   HGBUR NEGATIVE 06/15/2020 2340   BILIRUBINUR NEGATIVE 06/15/2020 2340   KETONESUR 5 (A) 06/15/2020 2340   PROTEINUR NEGATIVE 06/15/2020 2340   NITRITE NEGATIVE 06/15/2020 2340   LEUKOCYTESUR NEGATIVE 06/15/2020 2340    Sepsis Labs: @LABRCNTIP (procalcitonin:4,lacticidven:4) ) Recent Results (from the past 240 hour(s))  Resp Panel by RT-PCR (Flu A&B, Covid) Nasopharyngeal Swab     Status: None   Collection Time: 10/07/20 11:56 AM   Specimen: Nasopharyngeal Swab; Nasopharyngeal(NP) swabs in vial transport medium  Result Value Ref Range Status   SARS Coronavirus 2 by RT PCR NEGATIVE NEGATIVE Final    Comment: (NOTE) SARS-CoV-2 target nucleic acids are NOT DETECTED.  The SARS-CoV-2 RNA is generally detectable in upper respiratory specimens during the acute phase of infection. The lowest concentration of SARS-CoV-2 viral copies this assay can detect is 138 copies/mL. A negative result does not preclude SARS-Cov-2 infection and should not be used as the sole basis for treatment or other patient management decisions. A negative result may occur with  improper specimen collection/handling, submission of specimen other than nasopharyngeal swab, presence of viral mutation(s) within the areas targeted by this assay, and inadequate number of viral copies(<138 copies/mL). A negative result must be combined with clinical observations, patient history, and epidemiological information. The expected result is Negative.  Fact Sheet for Patients:  EntrepreneurPulse.com.au  Fact Sheet for Healthcare Providers:  IncredibleEmployment.be  This test is no t yet approved or cleared by the [place] FDA and  has been authorized for detection and/or diagnosis of SARS-CoV-2 by FDA under an Emergency Use Authorization (EUA). This EUA will remain  in effect (meaning this test can be used) for the duration of the COVID-19 declaration under Section 564(b)(1) of the Act, 21 U.S.C.section 360bbb-3(b)(1), unless the authorization is terminated  or revoked sooner.       Influenza A by PCR NEGATIVE NEGATIVE Final   Influenza B by PCR NEGATIVE NEGATIVE Final    Comment: (NOTE) The  Xpert Xpress SARS-CoV-2/FLU/RSV plus assay is intended as an aid in the diagnosis of influenza from Nasopharyngeal swab specimens and should not be used as a sole basis for treatment. Nasal washings and aspirates are unacceptable for Xpert Xpress SARS-CoV-2/FLU/RSV testing.  Fact Sheet for Patients: EntrepreneurPulse.com.au  Fact Sheet for Healthcare Providers: IncredibleEmployment.be  This test is not yet approved or cleared by the [place] FDA and has been authorized for detection and/or diagnosis of SARS-CoV-2 by FDA under an Emergency Use Authorization (EUA). This EUA will remain in effect (meaning this test can be used) for the duration of the COVID-19  declaration under Section 564(b)(1) of the Act, 21 U.S.C. section 360bbb-3(b)(1), unless the authorization is terminated or revoked.  Performed at [facility], [address] [place], [place] [address]      Radiological Exams on Admission: DG Chest 2 View  Result Date: 10/07/2020 CLINICAL DATA:  Hypoxia, shortness of breath, metastatic breast cancer EXAM: CHEST - 2 VIEW COMPARISON:  09/13/2020 FINDINGS: LEFT subclavian Port-A-Cath with tip projecting over RIGHT atrium. Enlargement of cardiac silhouette. Pulmonary vascular congestion. Persistent enlargement of RIGHT hilum question mass or adenopathy. Increased infiltrate of the mid to lower RIGHT lung. Small RIGHT pleural effusion again seen. Accentuation of markings in LEFT perihilar region little changed. No pneumothorax or acute osseous findings. IMPRESSION: Increased RIGHT perihilar infiltrate with persistent small RIGHT pleural effusion. Enlargement of RIGHT pulmonary hilum by adenopathy versus perihilar mass. Electronically Signed   By: Lavonia Dana M.D.   On: 10/07/2020 11:32   CT Angio Chest PE W and/or Wo Contrast  Result Date: 10/07/2020 CLINICAL DATA:  Breast carcinoma. Tachycardia and shortness of breath EXAM: CT  ANGIOGRAPHY CHEST WITH CONTRAST TECHNIQUE: Multidetector CT imaging of the chest was performed using the standard protocol during bolus administration of intravenous contrast. Multiplanar CT image reconstructions and MIPs were obtained to evaluate the vascular anatomy. CONTRAST:  68mL OMNIPAQUE IOHEXOL 350 MG/ML SOLN COMPARISON:  CT angiogram chest June 15, 2020; chest radiograph October 07, 2020 FINDINGS: Cardiovascular: There is no evident pulmonary embolus. No appreciable thoracic aortic aneurysm or dissection. Visualized great vessels appear unremarkable. There is no appreciable pericardial effusion or pericardial thickening. Port-A-Cath tip is in the superior vena cava near the cavoatrial junction. Mediastinum/Nodes: Thyroid appears normal. There are multiple axillary lymph nodes bilaterally, more severe on the right than on the left. The largest lymph node is seen in the right axillary region measuring 2.7 x 2.1 cm. There is adenopathy in the right supraclavicular and infraclavicular regions with extension of adenopathy into the right Peri carinal region. Largest individual lymph node in these areas measures 2.6 x 1.8 cm. Several left supraclavicular lymph nodes are also noted, largest measuring 1.7 x 1.3 cm. There are several subcentimeter mediastinal lymph nodes. There is extensive retrocrural adenopathy. The largest lymph node or collection of matted lymph nodes is seen inferior to the aorta on the left posteriorly measuring 3.4 x 2.1 cm. No esophageal lesions are evident. Lungs/Pleura: There is extensive airspace consolidation in portions of the right middle and lower lobes with interspersed areas of loculated pleural effusion. There is ill-defined airspace opacity consistent with pneumonia in the left upper lobe. Loculated fluid tracks along the left major fissure. There may well be associated metastasis within the fissure given the extensive nodularity in this area. There is a small left pleural  effusion with left base atelectasis. Pleural metastases noted along each upper hemithorax, primarily posteriorly as well as in the right apex. Upper Abdomen: Retrocrural adenopathy extends into the upper abdominal region. Visualized upper abdominal structures otherwise appear unremarkable. Musculoskeletal: There is a large mass occupying the right breast and chest wall region with invasion of the deep muscles of the chest wall on the right. There is also invasion of the right axilla. There is extensive subcutaneous thickening in both breast regions. There is a mass in the lateral left breast with extension of soft tissue prominence along the lateral left hemithorax, likely due to extension of neoplasm into the adjacent soft tissues. There is widespread sclerotic bony metastatic disease throughout the thoracic region. Review of the MIP  images confirms the above findings. IMPRESSION: 1. No demonstrable pulmonary embolus. No thoracic aortic aneurysm or dissection. 2. Areas of airspace consolidation noted in the right middle and lower lobes. More ill-defined infiltrate is noted in the left upper lobe anteriorly. Areas of loculated fluid noted in the left major fissure. Nodularity in this area could indicate superimposed masses in left major fissure. There is pleural thickening along both posterior upper hemithorax regions, likely pleural metastases. Loculated fluid noted in areas of infiltrate on the right. Small left pleural effusion noted. 3. Adenopathy at multiple sites, most severe in the retrocrural regions bilaterally, right supraclavicular region, and right axillary region, although there is left axillary and left supraclavicular adenopathy. 4. Large mass lesions occupying the right breast with extension and invasion of the chest wall and right axillary regions. A lesser degree of metastatic disease to the left of midline in the subcutaneous tissues and lateral left breast/lateral left chest wall soft tissues  noted. 5. Multifocal sclerotic bony metastatic disease throughout the thoracic region noted. Electronically Signed   By: Lowella Grip III M.D.   On: 10/07/2020 13:23     EKG: I have personally reviewed.  Sinus rhythm, tachycardia, QTC 451, low voltage, poor R wave progression, T wave inversion in lead III.  Assessment/Plan Principal Problem:   HCAP (healthcare-associated pneumonia) Active Problems:   Severe sepsis ([place])   Acute respiratory failure with hypoxia (H[facility])   Metastatic breast cancer (HCC)   Tachycardia   Iron deficiency anemia   Acute respiratory failure with hypoxia and severe sepsis due to HCAP (healthcare-associated pneumonia): Patient meets criteria for severe sepsis with leukocytosis with WBC 22.3, tachycardia with heart rate of 134, tachypnea with RR 22.  Lactic acid is elevated 2.3.  Currently hemodynamically stable.  -Admitted to progressive unit as inpatient -Please to progressive unit for observation - IV Vancomycin and cefepime (patient received 1 dose of Flagyl in ED) - Mucinex for cough  - Bronchodilators - Urine legionella and S. pneumococcal antigen - Follow up blood culture x2, sputum culture - will get Procalcitonin and trend lactic acid level per sepsis protocol - IVF: 2L of NS bolus in ED, followed by 75 mL per hour of NS   Metastatic breast cancer: Stage IV, metastasized to bone.  Patient has has a very large nerotic tumor in right breast, cannot completely rule out infection. -Patient is on broad antibiotics as above -Follow-up with oncology  Tachycardia -Continue home metoprolol  Iron deficiency anemia: Hemoglobin 11.0 -Continue home iron supplement    DVT ppx:  SQ Lovenox Code Status: Full code Family Communication: not done, no family member is at bed side.         Disposition Plan:  Anticipate discharge back to previous environment Consults called:  none Admission status and Level of care: Progressive Cardiac:    as inpt     Status is: Inpatient  Remains inpatient appropriate because:Inpatient level of care appropriate due to severity of illness   Dispo: The patient is from: Home              Anticipated d/c is to: Home              Patient currently is not medically stable to d/c.   Difficult to place patient No          Date of Service 10/07/2020    Ivor Costa [facility]   If 7PM-7AM, please contact night-coverage [email or web address] 10/07/2020, 6:30 PM

## 2020-10-07 NOTE — ED Provider Notes (Signed)
[facility] Emergency Department Provider Note  Time seen: 11:14 AM  I have reviewed the triage vital signs and the nursing notes.   HISTORY  Chief Complaint Sent by Dr and Tachycardia   HPI Jessica Willis is a 55 y.o. female with a past medical history of anemia, stage IV breast cancer currently on chemotherapy presents to the emergency department for fast heart rate and low oxygen level sent by her oncologist.  Patient is on weekly chemotherapy, states last week her heart rate was too high to receive chemotherapy, today she was noted again to have a fast heart rate as well as a low oxygen level and the patient was sent to the emergency department for evaluation.  It has been 2 weeks since the patient's last chemotherapy session.  Patient with low-grade temperature 99.4, denies any known fever at home.  Denies any cough.  States she felt somewhat short of breath but just today denies any prior shortness of breath.  Patient does have drainage from the right breast which she states has increased along with a foul odor recently.   Past Medical History:  Diagnosis Date  . Anemia   . Family history of breast cancer   . Patient denies medical problems     Patient Active Problem List   Diagnosis Date Noted  . QT prolongation 09/22/2020  . Pneumonia due to Streptococcus pyogenes ([place]) 08/22/2020  . Genetic testing 07/27/2020  . Pancreatic lesion 07/22/2020  . Swelling of lower leg 07/13/2020  . Encounter for antineoplastic chemotherapy 07/04/2020  . Complication of chemotherapy 07/04/2020  . Family history of breast cancer   . Port-A-Cath in place   . Bone metastasis ([place])   . Hypocalcemia   . Folate deficiency   . Gastroesophageal reflux disease without esophagitis   . Weakness of right arm   . Palliative care encounter   . Iron deficiency anemia   . Anemia, chronic disease   . Tachycardia   . Metastatic breast cancer ([place])   . Malignant pleural  effusion   . Chest tube in place   . Goals of care, counseling/discussion   . Hemothorax on right 06/16/2020  . Acute respiratory failure with hypoxia ([place])   . Breast mass, right 06/15/2020  . Mastitis 06/15/2020  . Pleural effusion 06/15/2020  . Intestinal obstruction ([place])   . Leukocytosis   . Hypokalemia 06/24/2015  . Sepsis ([place]) 06/23/2015  . CAP (community acquired pneumonia) 06/23/2015  . Partial small bowel obstruction ([place]) 06/23/2015    Past Surgical History:  Procedure Laterality Date  . BREAST BIOPSY  06/28/2020   Procedure: BREAST BIOPSY;  Surgeon: Benjamine Sprague, DO;  Location: [facility] ORS;  Service: General;;  . PORTACATH PLACEMENT N/A 06/28/2020   Procedure: INSERTION PORT-A-CATH;  Surgeon: Benjamine Sprague, DO;  Location: [facility] ORS;  Service: General;  Laterality: N/A;  . TUBAL LIGATION    . VIDEO ASSISTED THORACOSCOPY (VATS)/THOROCOTOMY Right 06/23/2020   Procedure: ATTEMPTED VIDEO ASSISTED THORACOSCOPY (VATS);  Surgeon: Nestor Lewandowsky, MD;  Location: [facility] ORS;  Service: General;  Laterality: Right;    Prior to Admission medications   Medication Sig Start Date End Date Taking? Authorizing Provider  calcium carbonate (CALCIUM 600) 600 MG TABS tablet Take 2 tablets (1,200 mg total) by mouth daily with breakfast. Patient taking differently: Take 1,200 mg by mouth daily with breakfast. 3 tab QD 09/01/20   Earlie Server, MD  FEROSUL 325 (65 Fe) MG tablet TAKE ONE (1) TABLET BY MOUTH TWO TIMES PER  DAY WITH A MEAL 08/31/20   Earlie Server, MD  lidocaine-prilocaine (EMLA) cream Apply 1 application topically as needed. Place a small amount of cream to port site prior to each chemo treatment, 1-2 hours before treatment. 07/04/20   Earlie Server, MD  magnesium chloride (SLOW-MAG) 64 MG TBEC SR tablet Take 1 tablet (64 mg total) by mouth daily. 09/22/20   Earlie Server, MD  metoprolol tartrate (LOPRESSOR) 50 MG tablet Take 1 tablet (50 mg total) by mouth 2 (two) times daily. 10/06/20 01/04/21  Kate Sable, MD  potassium chloride SA (KLOR-CON) 20 MEQ tablet Take 2 tablets (40 mEq total) by mouth daily. 09/30/20   Earlie Server, MD    No Known Allergies  Family History  Problem Relation Age of Onset  . Diabetes Other   . Hypertension Other   . Diabetes Maternal Aunt   . Breast cancer Cousin        dx 69s  . Breast cancer Cousin        dx 9s    Social History Social History   Tobacco Use  . Smoking status: Never Smoker  . Smokeless tobacco: Never Used  Vaping Use  . Vaping Use: Never used  Substance Use Topics  . Alcohol use: Not Currently    Alcohol/week: 1.0 standard drink    Types: 1 Cans of beer per week  . Drug use: No    Review of Systems Constitutional: Low-grade borderline fever today.  None at home. Cardiovascular: Negative for chest pain. Respiratory: Positive shortness of breath.  Denies cough. Gastrointestinal: Negative for abdominal pain, vomiting  Musculoskeletal: Negative for musculoskeletal complaints Skin: Negative for skin complaints  Neurological: Negative for headache All other ROS negative  ____________________________________________   PHYSICAL EXAM:  VITAL SIGNS: ED Triage Vitals  Enc Vitals Group     BP 10/07/20 1025 136/83     Pulse Rate 10/07/20 1025 (!) 127     Resp 10/07/20 1025 20     Temp 10/07/20 1025 99.4 F (37.4 C)     Temp Source 10/07/20 1025 Oral     SpO2 10/07/20 1025 97 %     Weight 10/07/20 1025 171 lb (77.6 kg)     Height 10/07/20 1025 5\' 10"  (1.778 m)     Head Circumference --      Peak Flow --      Pain Score 10/07/20 1046 0     Pain Loc --      Pain Edu? --      Excl. in [place]? --    Constitutional: Alert and oriented. Well appearing and in no distress. Eyes: Normal exam ENT      Head: Normocephalic and atraumatic.      Mouth/Throat: Mucous membranes are moist. Cardiovascular: Regular rhythm rate around 120 bpm.  No obvious murmur. Respiratory: Normal respiratory effort without tachypnea nor retractions.  Breath sounds are clear without obvious wheeze rales or rhonchi.  Left chest port present and currently accessed. Gastrointestinal: Soft and nontender. No distention.   Musculoskeletal: Nontender with normal range of motion in all extremities. No lower extremity tenderness or edema. Neurologic:  Normal speech and language. No gross focal neurologic deficits Skin: Patient has what appears to be significant advanced invasive breast cancer of the right breast with several areas of necrosis and drainage. Psychiatric: Mood and affect are normal.  ____________________________________________    EKG  EKG viewed and interpreted by myself shows sinus tachycardia 126 bpm with a narrow QRS, normal axis, normal intervals,  nonspecific with no concerning ST changes.  ____________________________________________    RADIOLOGY   IMPRESSION:  1. No demonstrable pulmonary embolus. No thoracic aortic aneurysm or  dissection.   2. Areas of airspace consolidation noted in the right middle and  lower lobes. More ill-defined infiltrate is noted in the left upper  lobe anteriorly. Areas of loculated fluid noted in the left major  fissure. Nodularity in this area could indicate superimposed masses  in left major fissure. There is pleural thickening along both  posterior upper hemithorax regions, likely pleural metastases.  Loculated fluid noted in areas of infiltrate on the right. Small  left pleural effusion noted.   3. Adenopathy at multiple sites, most severe in the retrocrural  regions bilaterally, right supraclavicular region, and right  axillary region, although there is left axillary and left  supraclavicular adenopathy.   4. Large mass lesions occupying the right breast with extension and  invasion of the chest wall and right axillary regions. A lesser  degree of metastatic disease to the left of midline in the  subcutaneous tissues and lateral left breast/lateral left chest wall  soft  tissues noted.   5. Multifocal sclerotic bony metastatic disease throughout the  thoracic region noted.     IMPRESSION:  Increased RIGHT perihilar infiltrate with persistent small RIGHT  pleural effusion.   Enlargement of RIGHT pulmonary hilum by adenopathy versus perihilar  mass.   ____________________________________________   INITIAL IMPRESSION / ASSESSMENT AND PLAN / ED COURSE  Pertinent labs & imaging results that were available during my care of the patient were reviewed by me and considered in my medical decision making (see chart for details).   Patient presents emergency department with tachycardia borderline low-grade fever nine 9.4 and hypoxic to 87% on room air currently satting in the mid upper 90s on 2 L.  Patient has stage IV breast cancer currently receiving chemotherapy last of which was 2 weeks ago.  Patient has significant and advanced appearing invasive breast cancer of the right breast with drainage, necrosis, foul odor.  Given these findings patient meets sepsis criteria.  We will check labs, cultures, fluids, start broad-spectrum antibiotics.  Patient will require admission to the hospital service for further work-up and treatment.  Given the patient's tachycardia I highly suspect sepsis or possibly pulmonary embolism.  We will likely proceed with CTA if lab work allows.  Patient's chest x-ray shows right-sided infiltrate.  CTA shows no PE but significant infiltrates as well as cancer progression metastatic spread.  Given the likely pneumonia possibly postobstructive with possible cellulitis significant leukocytosis we will admit the patient to the hospitalist service for continued IV antibiotics work-up and treatment.  Jessica Willis was evaluated in Emergency Department on 10/07/2020 for the symptoms described in the history of present illness. She was evaluated in the context of the global COVID-19 pandemic, which necessitated consideration that the patient  might be at risk for infection with the SARS-CoV-2 virus that causes COVID-19. Institutional protocols and algorithms that pertain to the evaluation of patients at risk for COVID-19 are in a state of rapid change based on information released by regulatory bodies including the CDC and federal and state organizations. These policies and algorithms were followed during the patient's care in the ED.  CRITICAL CARE Performed by: Harvest Dark   Total critical care time: 30 minutes  Critical care time was exclusive of separately billable procedures and treating other patients.  Critical care was necessary to treat or prevent imminent or life-threatening deterioration.  Critical care was time spent personally by me on the following activities: development of treatment plan with patient and/or surrogate as well as nursing, discussions with consultants, evaluation of patient's response to treatment, examination of patient, obtaining history from patient or surrogate, ordering and performing treatments and interventions, ordering and review of laboratory studies, ordering and review of radiographic studies, pulse oximetry and re-evaluation of patient's condition.  ____________________________________________   FINAL CLINICAL IMPRESSION(S) / ED DIAGNOSES  Sepsis Tachycardia Hypoxia Pneumonia Cellulitis   Harvest Dark, MD 10/07/20 1346

## 2020-10-08 ENCOUNTER — Inpatient Hospital Stay: Payer: Medicaid Other

## 2020-10-08 DIAGNOSIS — J189 Pneumonia, unspecified organism: Secondary | ICD-10-CM | POA: Diagnosis not present

## 2020-10-08 DIAGNOSIS — C50919 Malignant neoplasm of unspecified site of unspecified female breast: Secondary | ICD-10-CM | POA: Diagnosis not present

## 2020-10-08 DIAGNOSIS — A419 Sepsis, unspecified organism: Secondary | ICD-10-CM | POA: Diagnosis not present

## 2020-10-08 DIAGNOSIS — J9601 Acute respiratory failure with hypoxia: Secondary | ICD-10-CM | POA: Diagnosis not present

## 2020-10-08 DIAGNOSIS — R6520 Severe sepsis without septic shock: Secondary | ICD-10-CM

## 2020-10-08 LAB — BASIC METABOLIC PANEL
Anion gap: 9 (ref 5–15)
BUN: 14 mg/dL (ref 6–20)
CO2: 24 mmol/L (ref 22–32)
Calcium: 8.2 mg/dL — ABNORMAL LOW (ref 8.9–10.3)
Chloride: 106 mmol/L (ref 98–111)
Creatinine, Ser: 0.63 mg/dL (ref 0.44–1.00)
GFR, Estimated: >60 mL/min (ref >60)
Glucose, Bld: 99 mg/dL (ref 70–99)
Potassium: 3.8 mmol/L (ref 3.5–5.1)
Sodium: 139 mmol/L (ref 135–145)

## 2020-10-08 LAB — CBC
HCT: 35.8 % — ABNORMAL LOW (ref 36.0–46.0)
Hemoglobin: 11.1 g/dL — ABNORMAL LOW (ref 12.0–15.0)
MCH: 25.6 pg — ABNORMAL LOW (ref 26.0–34.0)
MCHC: 31 g/dL (ref 30.0–36.0)
MCV: 82.5 fL (ref 80.0–100.0)
Platelets: 413 10*3/uL — ABNORMAL HIGH (ref 150–400)
RBC: 4.34 MIL/uL (ref 3.87–5.11)
RDW: 16.4 % — ABNORMAL HIGH (ref 11.5–15.5)
WBC: 25.6 10*3/uL — ABNORMAL HIGH (ref 4.0–10.5)
nRBC: 0 % (ref 0.0–0.2)

## 2020-10-08 LAB — MRSA PCR SCREENING: MRSA by PCR: NEGATIVE

## 2020-10-08 LAB — URINALYSIS, COMPLETE (UACMP) WITH MICROSCOPIC
Bilirubin Urine: NEGATIVE
Glucose, UA: NEGATIVE mg/dL
Hgb urine dipstick: NEGATIVE
Ketones, ur: 5 mg/dL — AB
Leukocytes,Ua: NEGATIVE
Nitrite: NEGATIVE
Protein, ur: 30 mg/dL — AB
Specific Gravity, Urine: 1.038 — ABNORMAL HIGH (ref 1.005–1.030)
pH: 5 (ref 5.0–8.0)

## 2020-10-08 LAB — HIV ANTIBODY (ROUTINE TESTING W REFLEX): HIV Screen 4th Generation wRfx: NONREACTIVE

## 2020-10-08 LAB — STREP PNEUMONIAE URINARY ANTIGEN: Strep Pneumo Urinary Antigen: NEGATIVE

## 2020-10-08 LAB — CANCER ANTIGEN 27.29: CA 27.29: 862.4 U/mL — ABNORMAL HIGH (ref 0.0–38.6)

## 2020-10-08 LAB — CANCER ANTIGEN 15-3: CA 15-3: 261 U/mL — ABNORMAL HIGH (ref 0.0–25.0)

## 2020-10-08 IMAGING — US US EXTREM  UP VENOUS*R*
1 series · 13 of 24 positions shown · non-contrast
Comparison: None.

CLINICAL DATA: History of stage IV breast cancer now with right
upper extremity pain and edema. Evaluate for DVT.



[Series 1: us venous img upper uni right (dvt) · portal-venous · 13 of 32 slices shown]
[im 1/32]
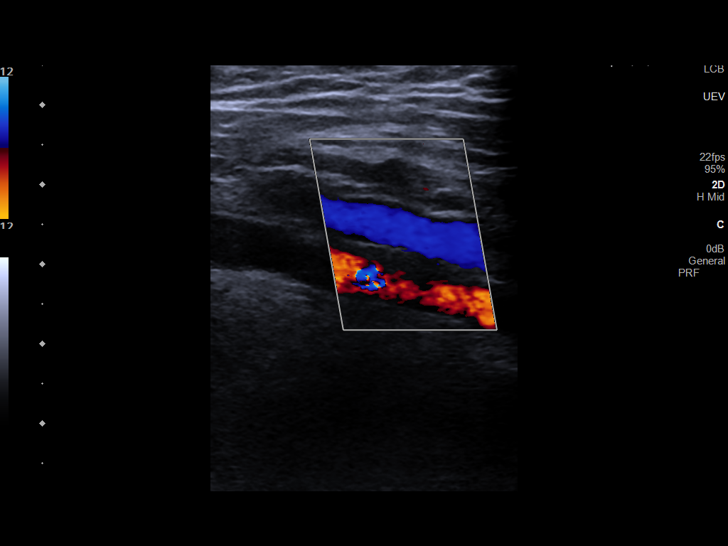
[im 3/32]
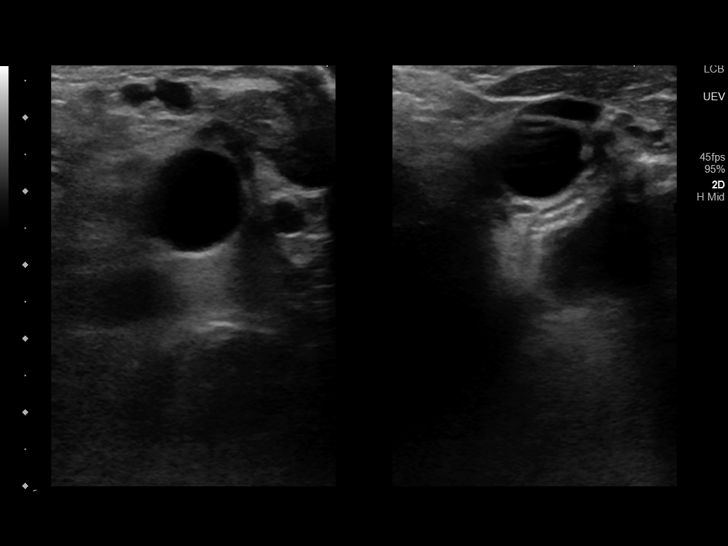
[im 6/32]
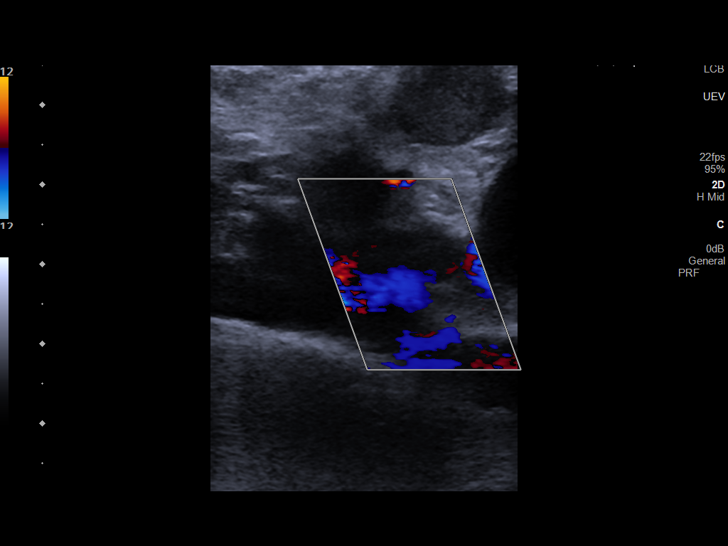
[im 9/32]
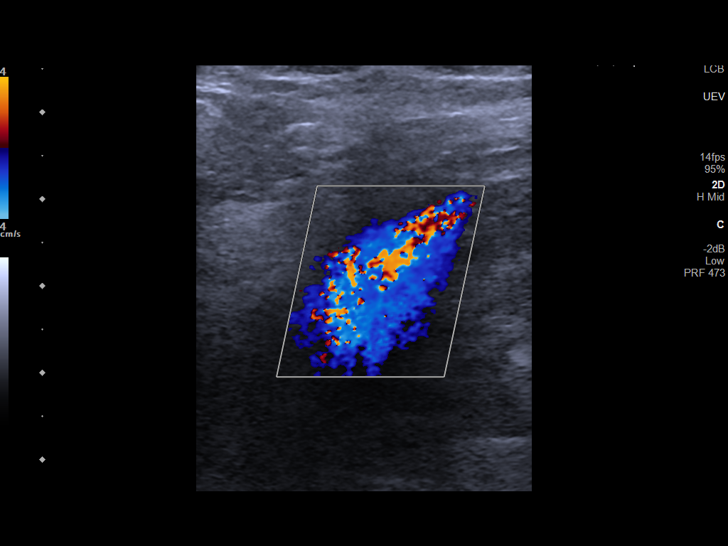
[im 11/32]
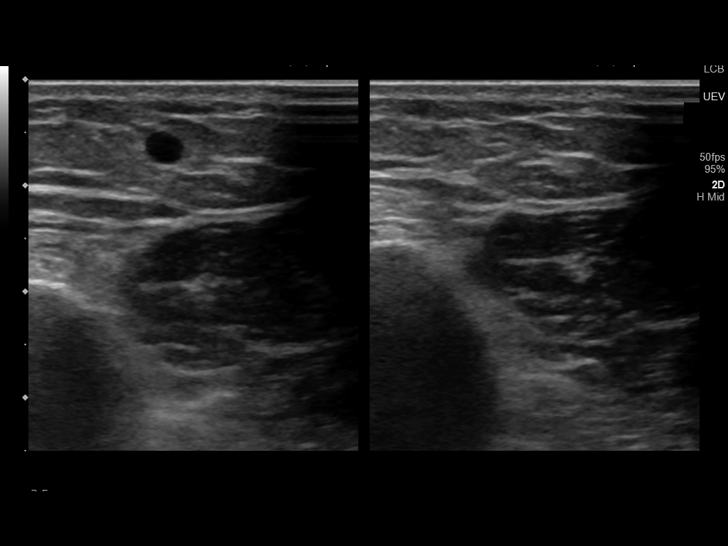
[im 14/32]
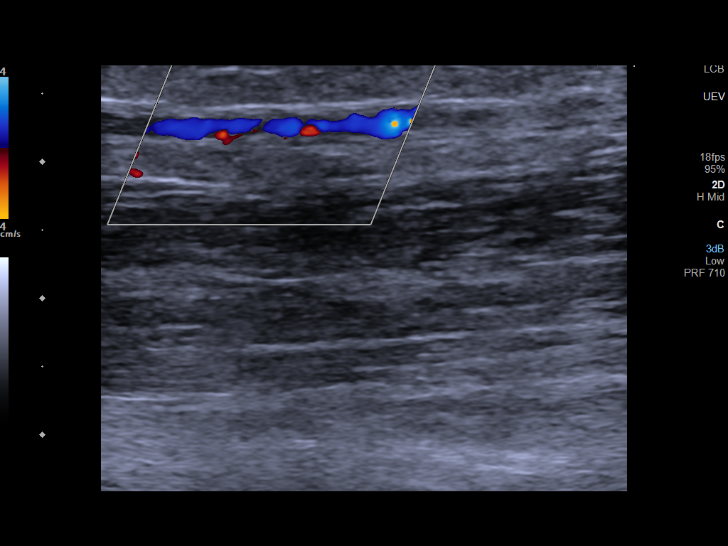
[im 17/32]
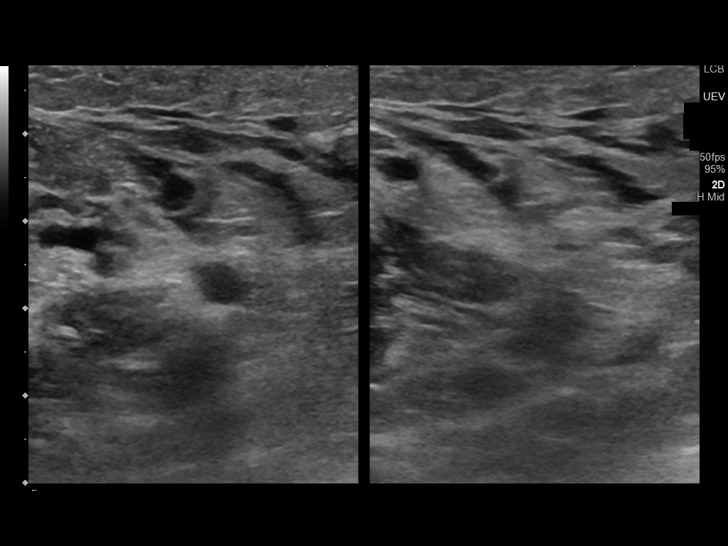
[im 18/32]
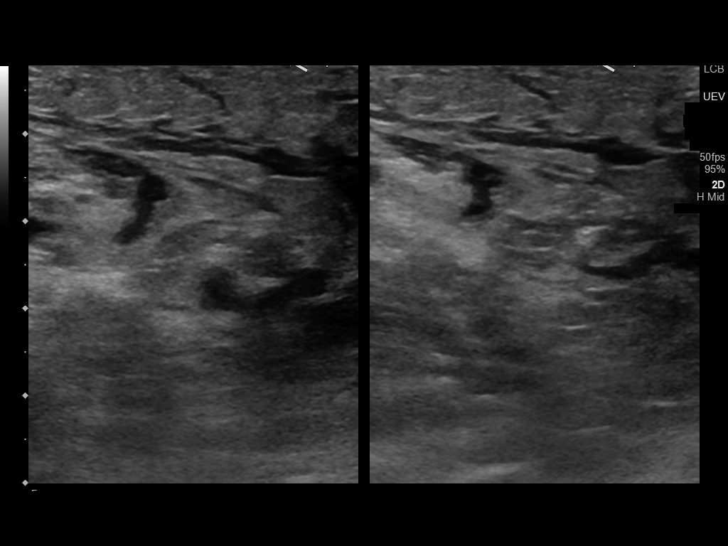
[im 21/32]
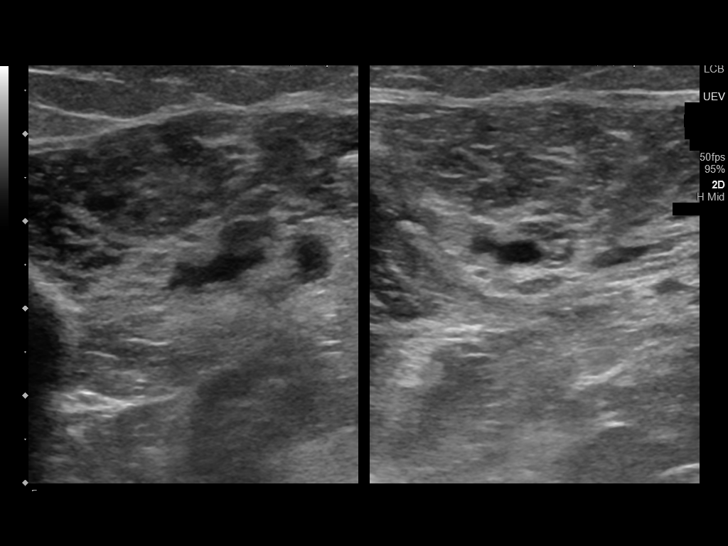
[im 23/32]
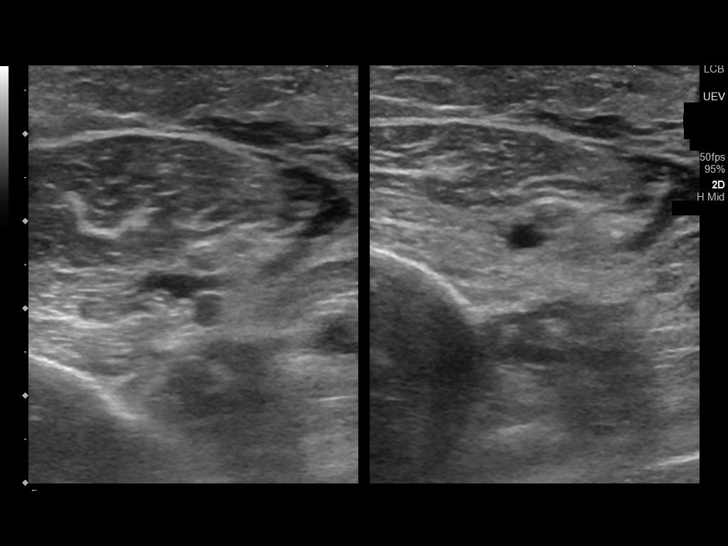
[im 26/32]
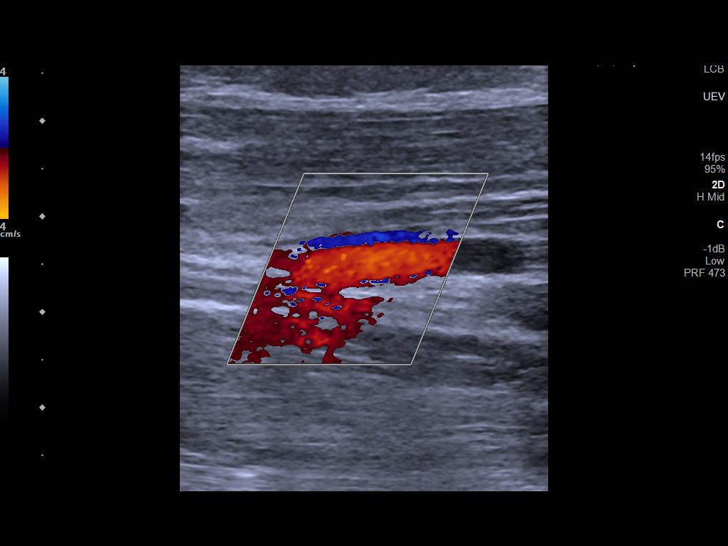
[im 29/32]
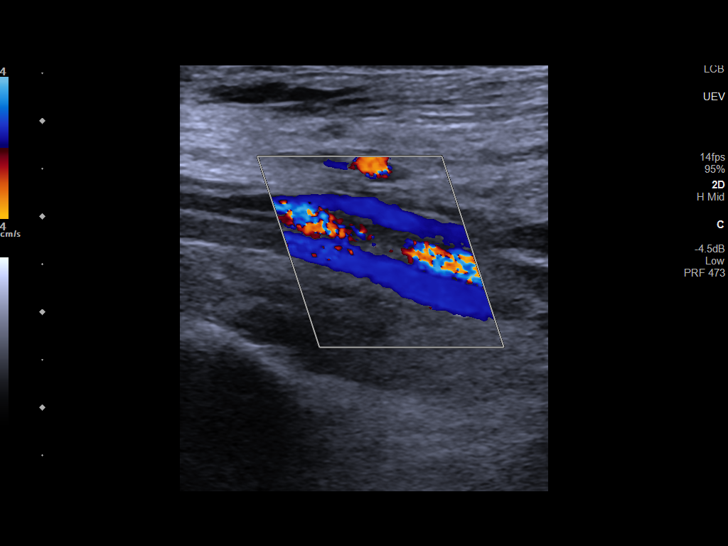
[im 32/32]
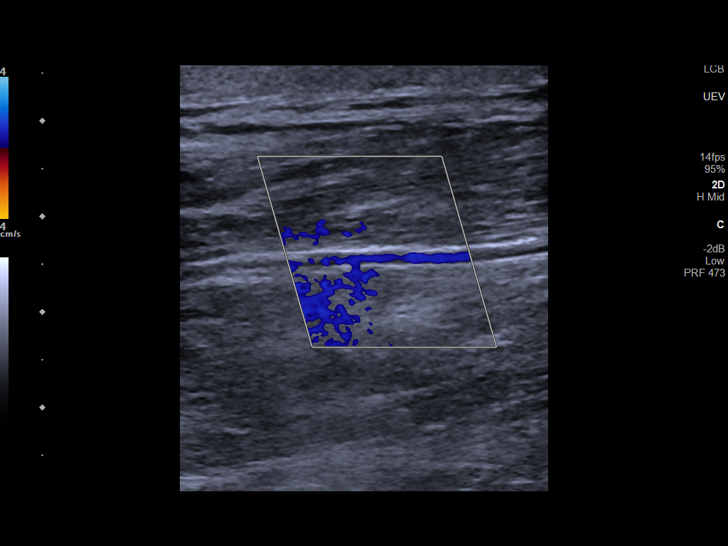

[13 of 24 positions shown; findings below may reference images not displayed]

FINDINGS: Contralateral Subclavian Vein: Respiratory phasicity is normal and
symmetric with the symptomatic side. No evidence of thrombus. Normal
compressibility.

Internal Jugular Vein: No evidence of thrombus. Normal
compressibility, respiratory phasicity and response to augmentation.

Subclavian Vein: No evidence of thrombus. Normal compressibility,
respiratory phasicity and response to augmentation.

Axillary Vein: Appears patent where imaged though evaluation
degraded secondary to patient's discomfort with sonographic
evaluation of the axilla.

Cephalic Vein: No evidence of thrombus. Normal compressibility,
respiratory phasicity and response to augmentation.

Basilic Vein: No evidence of thrombus. Normal compressibility,
respiratory phasicity and response to augmentation.

Brachial Veins: No evidence of thrombus within either of the paired
brachial veins. Normal compressibility, respiratory phasicity and
response to augmentation.

Radial Veins: No evidence of thrombus. Normal compressibility,
respiratory phasicity and response to augmentation.

Ulnar Veins: No evidence of thrombus. Normal compressibility,
respiratory phasicity and response to augmentation.

Venous Reflux:  None visualized.

Other Findings: There is a moderate amount of subcutaneous edema at
the level of the right upper arm.
IMPRESSION: No evidence of DVT within the right upper extremity.

## 2020-10-08 MED ORDER — MELATONIN 5 MG PO TABS
5.0000 mg | ORAL_TABLET | Freq: Every day | ORAL | Status: DC
  Administered 2020-10-08 – 2020-10-20 (×13): 5 mg via ORAL
  Filled 2020-10-08 (×13): qty 1

## 2020-10-08 MED ORDER — ENSURE ENLIVE PO LIQD
237.0000 mL | Freq: Three times a day (TID) | ORAL | Status: DC
  Administered 2020-10-08 – 2020-10-19 (×26): 237 mL via ORAL

## 2020-10-08 NOTE — Progress Notes (Signed)
   10/08/20 2116  Assess: MEWS Score  Temp 97.8 F (36.6 C)  BP (!) 155/77  Pulse Rate (!) 130  Resp 20  SpO2 100 %  O2 Device Nasal Cannula  O2 Flow Rate (L/min) 3.5 L/min  Assess: MEWS Score  MEWS Temp 0  MEWS Systolic 0  MEWS Pulse 3  MEWS RR 0  MEWS LOC 0  MEWS Score 3  MEWS Score Color Yellow  Assess: if the MEWS score is Yellow or Red  Were vital signs taken at a resting state? Yes  Focused Assessment No change from prior assessment  Early Detection of Sepsis Score *See Row Information* High  MEWS guidelines implemented *See Row Information* No, previously yellow, continue vital signs every 4 hours  Treat  MEWS Interventions Administered scheduled meds/treatments  Pain Scale 0-10  Pain Score 0  Escalate  MEWS: Escalate Yellow: discuss with charge nurse/RN and consider discussing with provider and RRT  Notify: Charge Nurse/RN  Name of Charge Nurse/RN Notified KaraCampbell RN  Date Charge Nurse/RN Notified 10/08/20  Time Charge Nurse/RN Notified 2117  Notify: Provider  Provider Name/Title MD aware  schedule med given .will notify md if still high  Document  Patient Outcome Other (Comment) (pt stable due schedule medication given will rechecked)

## 2020-10-08 NOTE — Progress Notes (Signed)
Initial Nutrition Assessment  DOCUMENTATION CODES:   Not applicable  INTERVENTION:  Ensure Enlive po TID, each supplement provides 350 kcal and 20 grams of protein  Magic cup BID with meals, each supplement provides 290 kcal and 9 grams of protein  Education with handout provided  NUTRITION DIAGNOSIS:   Inadequate oral intake related to decreased appetite as evidenced by per patient/family report.    GOAL:   Patient will meet greater than or equal to 90% of their needs   MONITOR:   PO intake,Supplement acceptance,I & O's,Skin,Labs,Weight trends  REASON FOR ASSESSMENT:   Malnutrition Screening Tool    ASSESSMENT:  55 year old female admitted with acute respiratory failure with hypoxia and severe sepsis due to HCAP. 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 Pt seen by oncologist in clinic and found to have oxygen desaturated to 87% on room air, tachycardia and reports shortness of breath over the last several days.  RD working remotely.  Spoke with pt via phone this afternoon, reports feeling some better today. States her meals have been okay, eating 50-90% per flowsheets. Her appetite has been "so so" since starting blood pressure medications, tries to make herself eat. She has been drinking Ensure High Protein at home, usually one/day (160 kcal, 16 grams protein). RD discussed types of supplements, recommended switching to Ensure Enlive to provide more calories/protein and increasing to supplement intake to 3-4 daily. RD educated on increased needs and the importance of adequate nutrition. Encouraged trying small frequent meals/snacks throughout the day, discussed strategies for increasing calories/protien, and provided handout which has been attached to discharge instructions. Pt appreciative and agreeable to chocolate or strawberry Ensure as well as trying  Magic Cup during admission.   Pt endorses wt loss, recalls usual weight around 192-196 lbs. Per chart, weights have trended down 14 lbs (7.5%) in the last 2 months; significant. Given metastatic breast cancer, highly suspect malnutrition however unable to identify at this time. Will plan to complete exam at follow-up.    Medications reviewed and include: Os-cal, Ferrous sulfate, Melatonin, Slow-mag, IV Maxipime 2 g every 8 hours  Labs: WBC 25.6 (H)  NUTRITION - FOCUSED PHYSICAL EXAM:  Unable to complete at this time  Diet Order:   Diet Order            Diet regular Room service appropriate? Yes; Fluid consistency: Thin  Diet effective now                 EDUCATION NEEDS:   Education needs have been addressed  Skin:  Skin Assessment: Skin Integrity Issues: Skin Integrity Issues:: Other (Comment) Other: open wound; anterior necrotic tumor; right breast  Last BM:  2/25  Height:   Ht Readings from Last 1 Encounters:  10/07/20 5\' 10"  (1.778 m)    Weight:   Wt Readings from Last 1 Encounters:  10/08/20 79.2 kg    BMI:  Body mass index is 25.07 kg/m.  Estimated Nutritional Needs:   Kcal:  5638-9373  Protein:  120-135  Fluid:  >2.3 L   Lajuan Lines, RD, LDN Clinical Nutrition After Hours/Weekend Pager # in [place]

## 2020-10-08 NOTE — Discharge Instructions (Signed)

## 2020-10-08 NOTE — Progress Notes (Signed)
PROGRESS NOTE    Jessica Willis  QVZ:[id number] DOB: [birth year] DOA: 10/07/2020 PCP: [facility]   Chief complaint.  Shortness of breath Brief Narrative:   Jessica Willis is a 55 y.o. female with medical history significant of stage IV metastasized breast cancer on chemotherapy, tachycardia, GERD, anemia, malignant pleural effusion, hemothorax, who presents with shortness of breath. Upon arriving the hospital, she was a found to have significant leukocytosis at 22.3, lactic acidosis of 2.3, she also  has tachycardia of 134 tachypnea of 33.  Profound elevation of procalcitonin level.  Chest CT scan showed pneumonia of the right middle lobe and lower lobe.  PE ruled out.  Patient was placed on vancomycin, cefepime for healthcare associated pneumonia.  Assessment & Plan:   Principal Problem:   HCAP (healthcare-associated pneumonia) Active Problems:   Severe sepsis ([facility])   Acute respiratory failure with hypoxia ([place])   Metastatic breast cancer ([facility])   Tachycardia   Iron deficiency anemia  #1.Acute respiratory failure with hypoxemia. Severe sepsis secondary to healthcare associated pneumonia. Right sided pneumonia. Patient condition appears to be improving today. Patient is hemodynamically stable, will discontinue IV fluids. Continue cefepime. Also obtain MRSA culture, consider discontinue vancomycin if that is negative. Repeat a procalcitonin level tomorrow.  However, elevated procalcitonin level could be due to cancer.  #2.  Metastatic breast cancer. Follow-up with oncology as outpatient.  3.  Iron deficient anemia. Stable.  #4.  Reactive thrombocytosis. Continue Lovenox prophylaxis.    DVT prophylaxis: Lovenox Code Status: Full Family Communication:  Disposition Plan:  .   Status is: Inpatient  Remains inpatient appropriate because:Inpatient level of care appropriate due to severity of illness   Dispo: The patient is from: Home               Anticipated d/c is to: Home              Patient currently is not medically stable to d/c.   Difficult to place patient No        I/O last 3 completed shifts: In: 385.3 [P.O.:240; I.V.:78.3; IV Piggyback:67] Out: -  Total I/O In: 480 [P.O.:480] Out: -      Consultants:   None  Procedures: None  Antimicrobials: Cefepime and vancomycin.  Subjective: Patient feels better today, short of breath improving.  Cough, with clear mucus. No chest pain or palpitation. No fever or chills. No abdominal pain or nausea vomiting. No dysuria or hematuria.  Objective: Vitals:   10/08/20 0600 10/08/20 0807 10/08/20 0810 10/08/20 1145  BP:  124/70  134/76  Pulse:  (!) 120 (!) 110 (!) 108  Resp:  20    Temp:  97.8 F (36.6 C)  98.2 F (36.8 C)  TempSrc:  Oral  Oral  SpO2: 90% 90%  90%  Weight:      Height:        Intake/Output Summary (Last 24 hours) at 10/08/2020 1226 Last data filed at 10/08/2020 0945 Gross per 24 hour  Intake 865.33 ml  Output --  Net 865.33 ml   Filed Weights   10/07/20 1025 10/07/20 1607 10/08/20 0304  Weight: 77.6 kg 78 kg 79.2 kg    Examination:  General exam: Appears calm and comfortable  Respiratory system: Decreased breathing sound in right lower field. Respiratory effort normal. Cardiovascular system: Regular and tachycardic.  No JVD, murmurs, rubs, gallops or clicks. No pedal edema. Gastrointestinal system: Abdomen is nondistended, soft and nontender. No organomegaly or masses felt. Normal bowel sounds heard.  Central nervous system: Alert and oriented. No focal neurological deficits. Extremities: Symmetric 5 x 5 power. Skin: No rashes, lesions or ulcers Psychiatry:  Mood & affect appropriate.     Data Reviewed: I have personally reviewed following labs and imaging studies  CBC: Recent Labs  Lab 10/07/20 0843 10/07/20 1025 10/08/20 0450  WBC 23.1* 22.3* 25.6*  NEUTROABS 19.8* 19.3*  --   HGB 10.7* 11.0* 11.1*  HCT 34.5* 36.1  35.8*  MCV 81.2 81.7 82.5  PLT 412* 414* 778*   Basic Metabolic Panel: Recent Labs  Lab 10/07/20 0843 10/07/20 1025 10/08/20 0450  NA 137 138 139  K 4.3 4.3 3.8  CL 101 104 106  CO2 23 25 24   GLUCOSE 123* 121* 99  BUN 17 16 14   CREATININE 0.70 0.61 0.63  CALCIUM 8.9 9.2 8.2*   GFR: Estimated Creatinine Clearance: 85.9 mL/min (by C-G formula based on SCr of 0.63 mg/dL). Liver Function Tests: Recent Labs  Lab 10/07/20 0843 10/07/20 1025  AST 71* 69*  ALT 22 21  ALKPHOS 132* 141*  BILITOT 0.5 0.5  PROT 7.2 7.0  ALBUMIN 2.9* 2.9*   No results for input(s): LIPASE, AMYLASE in the last 168 hours. No results for input(s): AMMONIA in the last 168 hours. Coagulation Profile: Recent Labs  Lab 10/07/20 1025  INR 1.3*   Cardiac Enzymes: No results for input(s): CKTOTAL, CKMB, CKMBINDEX, TROPONINI in the last 168 hours. BNP (last 3 results) No results for input(s): PROBNP in the last 8760 hours. HbA1C: No results for input(s): HGBA1C in the last 72 hours. CBG: No results for input(s): GLUCAP in the last 168 hours. Lipid Profile: No results for input(s): CHOL, HDL, LDLCALC, TRIG, CHOLHDL, LDLDIRECT in the last 72 hours. Thyroid Function Tests: No results for input(s): TSH, T4TOTAL, FREET4, T3FREE, THYROIDAB in the last 72 hours. Anemia Panel: No results for input(s): VITAMINB12, FOLATE, FERRITIN, TIBC, IRON, RETICCTPCT in the last 72 hours. Sepsis Labs: Recent Labs  Lab 10/07/20 1139 10/07/20 1340 10/07/20 1746  PROCALCITON  --   --  87.91  LATICACIDVEN 2.3* 1.5 2.4*    Recent Results (from the past 240 hour(s))  Blood Culture (routine x 2)     Status: None (Preliminary result)   Collection Time: 10/07/20 11:39 AM   Specimen: BLOOD  Result Value Ref Range Status   Specimen Description BLOOD LEFT WRIST  Final   Special Requests   Final    BOTTLES DRAWN AEROBIC AND ANAEROBIC Blood Culture adequate volume   Culture   Final    NO GROWTH < 24 HOURS Performed at  [facility], [address], [place], [place] [address]    Report Status PENDING  Incomplete  Resp Panel by RT-PCR (Flu A&B, Covid) Nasopharyngeal Swab     Status: None   Collection Time: 10/07/20 11:56 AM   Specimen: Nasopharyngeal Swab; Nasopharyngeal(NP) swabs in vial transport medium  Result Value Ref Range Status   SARS Coronavirus 2 by RT PCR NEGATIVE NEGATIVE Final    Comment: (NOTE) SARS-CoV-2 target nucleic acids are NOT DETECTED.  The SARS-CoV-2 RNA is generally detectable in upper respiratory specimens during the acute phase of infection. The lowest concentration of SARS-CoV-2 viral copies this assay can detect is 138 copies/mL. A negative result does not preclude SARS-Cov-2 infection and should not be used as the sole basis for treatment or other patient management decisions. A negative result may occur with  improper specimen collection/handling, submission of specimen other than nasopharyngeal swab, presence of  viral mutation(s) within the areas targeted by this assay, and inadequate number of viral copies(<138 copies/mL). A negative result must be combined with clinical observations, patient history, and epidemiological information. The expected result is Negative.  Fact Sheet for Patients:  EntrepreneurPulse.com.au  Fact Sheet for Healthcare Providers:  IncredibleEmployment.be  This test is no t yet approved or cleared by the [place] FDA and  has been authorized for detection and/or diagnosis of SARS-CoV-2 by FDA under an Emergency Use Authorization (EUA). This EUA will remain  in effect (meaning this test can be used) for the duration of the COVID-19 declaration under Section 564(b)(1) of the Act, 21 U.S.C.section 360bbb-3(b)(1), unless the authorization is terminated  or revoked sooner.       Influenza A by PCR NEGATIVE NEGATIVE Final   Influenza B by PCR NEGATIVE NEGATIVE Final    Comment: (NOTE) The  Xpert Xpress SARS-CoV-2/FLU/RSV plus assay is intended as an aid in the diagnosis of influenza from Nasopharyngeal swab specimens and should not be used as a sole basis for treatment. Nasal washings and aspirates are unacceptable for Xpert Xpress SARS-CoV-2/FLU/RSV testing.  Fact Sheet for Patients: EntrepreneurPulse.com.au  Fact Sheet for Healthcare Providers: IncredibleEmployment.be  This test is not yet approved or cleared by the [place] FDA and has been authorized for detection and/or diagnosis of SARS-CoV-2 by FDA under an Emergency Use Authorization (EUA). This EUA will remain in effect (meaning this test can be used) for the duration of the COVID-19 declaration under Section 564(b)(1) of the Act, 21 U.S.C. section 360bbb-3(b)(1), unless the authorization is terminated or revoked.  Performed at [facility], [address] [place], [place] [address]   Blood Culture (routine x 2)     Status: None (Preliminary result)   Collection Time: 10/07/20 11:56 AM   Specimen: BLOOD  Result Value Ref Range Status   Specimen Description BLOOD RIGHT HAND  Final   Special Requests   Final    BOTTLES DRAWN AEROBIC AND ANAEROBIC Blood Culture adequate volume   Culture   Final    NO GROWTH < 24 HOURS Performed at [facility], [address] [place], [place] [address]    Report Status PENDING  Incomplete         Radiology Studies: DG Chest 2 View  Result Date: 10/07/2020 CLINICAL DATA:  Hypoxia, shortness of breath, metastatic breast cancer EXAM: CHEST - 2 VIEW COMPARISON:  09/13/2020 FINDINGS: LEFT subclavian Port-A-Cath with tip projecting over RIGHT atrium. Enlargement of cardiac silhouette. Pulmonary vascular congestion. Persistent enlargement of RIGHT hilum question mass or adenopathy. Increased infiltrate of the mid to lower RIGHT lung. Small RIGHT pleural effusion again seen. Accentuation of markings in LEFT  perihilar region little changed. No pneumothorax or acute osseous findings. IMPRESSION: Increased RIGHT perihilar infiltrate with persistent small RIGHT pleural effusion. Enlargement of RIGHT pulmonary hilum by adenopathy versus perihilar mass. Electronically Signed   By: Lavonia Dana M.D.   On: 10/07/2020 11:32   CT Angio Chest PE W and/or Wo Contrast  Result Date: 10/07/2020 CLINICAL DATA:  Breast carcinoma. Tachycardia and shortness of breath EXAM: CT ANGIOGRAPHY CHEST WITH CONTRAST TECHNIQUE: Multidetector CT imaging of the chest was performed using the standard protocol during bolus administration of intravenous contrast. Multiplanar CT image reconstructions and MIPs were obtained to evaluate the vascular anatomy. CONTRAST:  30mL OMNIPAQUE IOHEXOL 350 MG/ML SOLN COMPARISON:  CT angiogram chest June 15, 2020; chest radiograph October 07, 2020 FINDINGS: Cardiovascular: There is no evident pulmonary embolus. No  appreciable thoracic aortic aneurysm or dissection. Visualized great vessels appear unremarkable. There is no appreciable pericardial effusion or pericardial thickening. Port-A-Cath tip is in the superior vena cava near the cavoatrial junction. Mediastinum/Nodes: Thyroid appears normal. There are multiple axillary lymph nodes bilaterally, more severe on the right than on the left. The largest lymph node is seen in the right axillary region measuring 2.7 x 2.1 cm. There is adenopathy in the right supraclavicular and infraclavicular regions with extension of adenopathy into the right Peri carinal region. Largest individual lymph node in these areas measures 2.6 x 1.8 cm. Several left supraclavicular lymph nodes are also noted, largest measuring 1.7 x 1.3 cm. There are several subcentimeter mediastinal lymph nodes. There is extensive retrocrural adenopathy. The largest lymph node or collection of matted lymph nodes is seen inferior to the aorta on the left posteriorly measuring 3.4 x 2.1 cm. No  esophageal lesions are evident. Lungs/Pleura: There is extensive airspace consolidation in portions of the right middle and lower lobes with interspersed areas of loculated pleural effusion. There is ill-defined airspace opacity consistent with pneumonia in the left upper lobe. Loculated fluid tracks along the left major fissure. There may well be associated metastasis within the fissure given the extensive nodularity in this area. There is a small left pleural effusion with left base atelectasis. Pleural metastases noted along each upper hemithorax, primarily posteriorly as well as in the right apex. Upper Abdomen: Retrocrural adenopathy extends into the upper abdominal region. Visualized upper abdominal structures otherwise appear unremarkable. Musculoskeletal: There is a large mass occupying the right breast and chest wall region with invasion of the deep muscles of the chest wall on the right. There is also invasion of the right axilla. There is extensive subcutaneous thickening in both breast regions. There is a mass in the lateral left breast with extension of soft tissue prominence along the lateral left hemithorax, likely due to extension of neoplasm into the adjacent soft tissues. There is widespread sclerotic bony metastatic disease throughout the thoracic region. Review of the MIP images confirms the above findings. IMPRESSION: 1. No demonstrable pulmonary embolus. No thoracic aortic aneurysm or dissection. 2. Areas of airspace consolidation noted in the right middle and lower lobes. More ill-defined infiltrate is noted in the left upper lobe anteriorly. Areas of loculated fluid noted in the left major fissure. Nodularity in this area could indicate superimposed masses in left major fissure. There is pleural thickening along both posterior upper hemithorax regions, likely pleural metastases. Loculated fluid noted in areas of infiltrate on the right. Small left pleural effusion noted. 3. Adenopathy at  multiple sites, most severe in the retrocrural regions bilaterally, right supraclavicular region, and right axillary region, although there is left axillary and left supraclavicular adenopathy. 4. Large mass lesions occupying the right breast with extension and invasion of the chest wall and right axillary regions. A lesser degree of metastatic disease to the left of midline in the subcutaneous tissues and lateral left breast/lateral left chest wall soft tissues noted. 5. Multifocal sclerotic bony metastatic disease throughout the thoracic region noted. Electronically Signed   By: Lowella Grip III M.D.   On: 10/07/2020 13:23        Scheduled Meds: . calcium carbonate  1,250 mg Oral Q breakfast  . enoxaparin (LOVENOX) injection  40 mg Subcutaneous Q24H  . ferrous sulfate  325 mg Oral BID WC  . magnesium chloride  1 tablet Oral Daily  . metoprolol tartrate  50 mg Oral BID   Continuous  Infusions: . sodium chloride 75 mL/hr at 10/08/20 0436  . ceFEPime (MAXIPIME) IV 2 g (10/08/20 0439)  . vancomycin 1,000 mg (10/08/20 0510)     LOS: 1 day    Time spent: 34 minutes    Sharen Hones, MD [facility]   To contact the attending provider between 7A-7P or the covering provider during after hours 7P-7A, please log into the web site [email or web address] and access using universal [place] password for that web site. If you do not have the password, please call the hospital operator.  10/08/2020, 12:26 PM

## 2020-10-08 NOTE — Progress Notes (Signed)
   10/07/20 1607  Assess: MEWS Score  Temp 98.2 F (36.8 C)  BP 139/81  Pulse Rate (!) 124  Resp 16  SpO2 97 %  O2 Device Room Air  Assess: MEWS Score  MEWS Temp 0  MEWS Systolic 0  MEWS Pulse 2  MEWS RR 0  MEWS LOC 0  MEWS Score 2  MEWS Score Color Yellow  Assess: if the MEWS score is Yellow or Red  Were vital signs taken at a resting state? Yes  Focused Assessment No change from prior assessment  Early Detection of Sepsis Score *See Row Information* Medium  MEWS guidelines implemented *See Row Information* Yes  Treat  MEWS Interventions Administered scheduled meds/treatments  Pain Scale 0-10  Pain Score 0  Take Vital Signs  Increase Vital Sign Frequency  Yellow: Q 2hr X 2 then Q 4hr X 2, if remains yellow, continue Q 4hrs  Escalate  MEWS: Escalate Yellow: discuss with charge nurse/RN and consider discussing with provider and RRT  Notify: Charge Nurse/RN  Name of Charge Nurse/RN Notified [place], [place]  Date Charge Nurse/RN Notified 10/07/20  Time Charge Nurse/RN Notified 1628  Inserted for [employer] RN

## 2020-10-09 DIAGNOSIS — J9601 Acute respiratory failure with hypoxia: Secondary | ICD-10-CM | POA: Diagnosis not present

## 2020-10-09 DIAGNOSIS — C50919 Malignant neoplasm of unspecified site of unspecified female breast: Secondary | ICD-10-CM | POA: Diagnosis not present

## 2020-10-09 DIAGNOSIS — J189 Pneumonia, unspecified organism: Secondary | ICD-10-CM | POA: Diagnosis not present

## 2020-10-09 LAB — BASIC METABOLIC PANEL
Anion gap: 8 (ref 5–15)
BUN: 15 mg/dL (ref 6–20)
CO2: 25 mmol/L (ref 22–32)
Calcium: 8.5 mg/dL — ABNORMAL LOW (ref 8.9–10.3)
Chloride: 105 mmol/L (ref 98–111)
Creatinine, Ser: 0.46 mg/dL (ref 0.44–1.00)
GFR, Estimated: >60 mL/min (ref >60)
Glucose, Bld: 117 mg/dL — ABNORMAL HIGH (ref 70–99)
Potassium: 4.3 mmol/L (ref 3.5–5.1)
Sodium: 138 mmol/L (ref 135–145)

## 2020-10-09 LAB — CBC WITH DIFFERENTIAL/PLATELET
Abs Immature Granulocytes: 0.4 10*3/uL — ABNORMAL HIGH (ref 0.00–0.07)
Basophils Absolute: 0.1 10*3/uL (ref 0.0–0.1)
Basophils Relative: 0 %
Eosinophils Absolute: 0 10*3/uL (ref 0.0–0.5)
Eosinophils Relative: 0 %
HCT: 32.9 % — ABNORMAL LOW (ref 36.0–46.0)
Hemoglobin: 9.9 g/dL — ABNORMAL LOW (ref 12.0–15.0)
Immature Granulocytes: 2 %
Lymphocytes Relative: 7 %
Lymphs Abs: 1.5 10*3/uL (ref 0.7–4.0)
MCH: 25 pg — ABNORMAL LOW (ref 26.0–34.0)
MCHC: 30.1 g/dL (ref 30.0–36.0)
MCV: 83.1 fL (ref 80.0–100.0)
Monocytes Absolute: 1.5 10*3/uL — ABNORMAL HIGH (ref 0.1–1.0)
Monocytes Relative: 6 %
Neutro Abs: 19.7 10*3/uL — ABNORMAL HIGH (ref 1.7–7.7)
Neutrophils Relative %: 85 %
Platelets: 382 10*3/uL (ref 150–400)
RBC: 3.96 MIL/uL (ref 3.87–5.11)
RDW: 16.2 % — ABNORMAL HIGH (ref 11.5–15.5)
WBC: 23.1 10*3/uL — ABNORMAL HIGH (ref 4.0–10.5)
nRBC: 0 % (ref 0.0–0.2)

## 2020-10-09 LAB — BRAIN NATRIURETIC PEPTIDE: B Natriuretic Peptide: 180.5 pg/mL — ABNORMAL HIGH (ref 0.0–100.0)

## 2020-10-09 LAB — PROCALCITONIN: Procalcitonin: 66.68 ng/mL

## 2020-10-09 LAB — PHOSPHORUS: Phosphorus: 3.2 mg/dL (ref 2.5–4.6)

## 2020-10-09 LAB — MAGNESIUM: Magnesium: 1.9 mg/dL (ref 1.7–2.4)

## 2020-10-09 NOTE — Progress Notes (Signed)
   10/09/20 0303  Assess: MEWS Score  Temp 97.7 F (36.5 C)  BP 127/74  Pulse Rate (!) 115  Resp 20  SpO2 90 %  O2 Device Nasal Cannula  Assess: MEWS Score  MEWS Temp 0  MEWS Systolic 0  MEWS Pulse 2  MEWS RR 0  MEWS LOC 0  MEWS Score 2  MEWS Score Color Yellow  Assess: if the MEWS score is Yellow or Red  Were vital signs taken at a resting state? Yes  Focused Assessment No change from prior assessment  MEWS guidelines implemented *See Row Information* No, vital signs rechecked  Treat  MEWS Interventions Administered scheduled meds/treatments  Pain Scale 0-10  Pain Score 0  Take Vital Signs  Increase Vital Sign Frequency  Yellow: Q 2hr X 2 then Q 4hr X 2, if remains yellow, continue Q 4hrs  Escalate  MEWS: Escalate Yellow: discuss with charge nurse/RN and consider discussing with provider and RRT  Notify: Charge Nurse/RN  Name of Charge Nurse/RN Notified Jake Samples RN  Date Charge Nurse/RN Notified 10/09/20  Time Charge Nurse/RN Notified 0303  Notify: Provider  Provider Name/Title NP  Randol Kern (already aware no new orders)  Date Provider Notified 10/09/20  Time Provider Notified 0200  Notification Type Face-to-face  Notification Reason Other (Comment) (Hr)  Provider response In department  Document  Patient Outcome Stabilized after interventions

## 2020-10-09 NOTE — Plan of Care (Signed)

## 2020-10-09 NOTE — Progress Notes (Signed)
PROGRESS NOTE    Jessica Willis  PFX:[id number] DOB: 1966-04-12 DOA: 10/07/2020 PCP: [facility]   Chief complaint.  Shortness of breath. Brief Narrative:  Jessica Willis a 55 y.o.femalewith medical history significant ofstage IV metastasized breast cancer on chemotherapy, tachycardia, GERD, anemia, malignant pleural effusion, hemothorax, who presents with shortness of breath. Upon arriving the hospital, she was a found to have significant leukocytosis at 22.3, lactic acidosis of 2.3, she also  has tachycardia of 134 tachypnea of 33.  Profound elevation of procalcitonin level.  Chest CT scan showed pneumonia of the right middle lobe and lower lobe.  PE ruled out.  Patient was placed on vancomycin, cefepime for healthcare associated pneumonia. Vancomycin discontinued after MRSA culture negative.   Assessment & Plan:   Principal Problem:   HCAP (healthcare-associated pneumonia) Active Problems:   Severe sepsis ([place])   Acute respiratory failure with hypoxia ([place])   Metastatic breast cancer ([facility])   Tachycardia   Iron deficiency anemia  #1.Acute respiratory failure with hypoxemia. Severe sepsis secondary to healthcare associated pneumonia. Right sided pneumonia. Patient had increased oxygen requirement last night to 4 L, she states that she became more short of breath yesterday evening after walking, oxygen was increased after that.  She has significant tachycardia, but short of breath seem to be improving. I checked BNP, not significant elevated.  Procalcitonin level gradually better.  We will continue cefepime for now. Right ultrasound did not show DVT.  #2.  Metastatic breast cancer. Sinus tachycardia. Patient be followed by oncology as outpatient. Sinus tachycardia appears to be secondary to debility.  She is on metoprolol 50 mg twice a day.  Obtain echocardiogram to rule out pericardial effusion.  3.  Iron deficiency anemia. Reactive  thrombocytosis Stable.      DVT prophylaxis: Lovenox Code Status: Full Family Communication:  Disposition Plan:  .   Status is: Inpatient  Remains inpatient appropriate because:Inpatient level of care appropriate due to severity of illness   Dispo: The patient is from: Home              Anticipated d/c is to: Home              Patient currently is not medically stable to d/c.   Difficult to place patient No        I/O last 3 completed shifts: In: 2267 [P.O.:1667; IV Piggyback:600] Out: 1000 [Urine:1000] Total I/O In: 360 [P.O.:360] Out: -      Consultants:   None  Procedures: None  Antimicrobials: Cefepime  Subjective: Patient currently on 4 L oxygen, increased oxygen requirement after she walked in the evening.  She still has short of breath with torsion.  She has a cough, nonproductive.  He did not have chest pain. No abdominal pain or nausea vomiting. No fever chills per No dysuria or hematuria.  Objective: Vitals:   10/09/20 0303 10/09/20 0627 10/09/20 0742 10/09/20 1108  BP: 127/74 124/79 123/77 118/72  Pulse: (!) 115 (!) 117 (!) 120 (!) 102  Resp: 20 19 16 16   Temp: 97.7 F (36.5 C) 98.3 F (36.8 C) (!) 97.5 F (36.4 C) 97.9 F (36.6 C)  TempSrc: Axillary Oral Oral Oral  SpO2: 90% 98% 93% 95%  Weight: 79.9 kg     Height:        Intake/Output Summary (Last 24 hours) at 10/09/2020 1327 Last data filed at 10/09/2020 0950 Gross per 24 hour  Intake 2147 ml  Output 1000 ml  Net 1147 ml  Filed Weights   10/07/20 1607 10/08/20 0304 10/09/20 0303  Weight: 78 kg 79.2 kg 79.9 kg    Examination:  General exam: Appears calm and comfortable  Respiratory system: Clear to auscultation. Respiratory effort normal. Cardiovascular system: Regular and tachycardic. No JVD, murmurs, rubs, gallops or clicks. No pedal edema. Gastrointestinal system: Abdomen is nondistended, soft and nontender. No organomegaly or masses felt. Normal bowel sounds  heard. Central nervous system: Alert and oriented. No focal neurological deficits. Extremities: Right arm swelling Skin: No rashes, lesions or ulcers Psychiatry: Judgement and insight appear normal. Mood & affect appropriate.     Data Reviewed: I have personally reviewed following labs and imaging studies  CBC: Recent Labs  Lab 10/07/20 0843 10/07/20 1025 10/08/20 0450 10/09/20 0518  WBC 23.1* 22.3* 25.6* 23.1*  NEUTROABS 19.8* 19.3*  --  19.7*  HGB 10.7* 11.0* 11.1* 9.9*  HCT 34.5* 36.1 35.8* 32.9*  MCV 81.2 81.7 82.5 83.1  PLT 412* 414* 413* 973   Basic Metabolic Panel: Recent Labs  Lab 10/07/20 0843 10/07/20 1025 10/08/20 0450 10/09/20 0518  NA 137 138 139 138  K 4.3 4.3 3.8 4.3  CL 101 104 106 105  CO2 23 25 24 25   GLUCOSE 123* 121* 99 117*  BUN 17 16 14 15   CREATININE 0.70 0.61 0.63 0.46  CALCIUM 8.9 9.2 8.2* 8.5*  MG  --   --   --  1.9  PHOS  --   --   --  3.2   GFR: Estimated Creatinine Clearance: 85.9 mL/min (by C-G formula based on SCr of 0.46 mg/dL). Liver Function Tests: Recent Labs  Lab 10/07/20 0843 10/07/20 1025  Willis 71* 69*  ALT 22 21  ALKPHOS 132* 141*  BILITOT 0.5 0.5  PROT 7.2 7.0  ALBUMIN 2.9* 2.9*   No results for input(s): LIPASE, AMYLASE in the last 168 hours. No results for input(s): AMMONIA in the last 168 hours. Coagulation Profile: Recent Labs  Lab 10/07/20 1025  INR 1.3*   Cardiac Enzymes: No results for input(s): CKTOTAL, CKMB, CKMBINDEX, TROPONINI in the last 168 hours. BNP (last 3 results) No results for input(s): PROBNP in the last 8760 hours. HbA1C: No results for input(s): HGBA1C in the last 72 hours. CBG: No results for input(s): GLUCAP in the last 168 hours. Lipid Profile: No results for input(s): CHOL, HDL, LDLCALC, TRIG, CHOLHDL, LDLDIRECT in the last 72 hours. Thyroid Function Tests: No results for input(s): TSH, T4TOTAL, FREET4, T3FREE, THYROIDAB in the last 72 hours. Anemia Panel: No results for  input(s): VITAMINB12, FOLATE, FERRITIN, TIBC, IRON, RETICCTPCT in the last 72 hours. Sepsis Labs: Recent Labs  Lab 10/07/20 1139 10/07/20 1340 10/07/20 1746 10/09/20 0518  PROCALCITON  --   --  87.91 66.68  LATICACIDVEN 2.3* 1.5 2.4*  --     Recent Results (from the past 240 hour(s))  Blood Culture (routine x 2)     Status: None (Preliminary result)   Collection Time: 10/07/20 11:39 AM   Specimen: BLOOD  Result Value Ref Range Status   Specimen Description BLOOD LEFT WRIST  Final   Special Requests   Final    BOTTLES DRAWN AEROBIC AND ANAEROBIC Blood Culture adequate volume   Culture   Final    NO GROWTH 2 DAYS Performed at [facility], [address] [place], [place] [address]    Report Status PENDING  Incomplete  Resp Panel by RT-PCR (Flu A&B, Covid) Nasopharyngeal Swab     Status: None  Collection Time: 10/07/20 11:56 AM   Specimen: Nasopharyngeal Swab; Nasopharyngeal(NP) swabs in vial transport medium  Result Value Ref Range Status   SARS Coronavirus 2 by RT PCR NEGATIVE NEGATIVE Final    Comment: (NOTE) SARS-CoV-2 target nucleic acids are NOT DETECTED.  The SARS-CoV-2 RNA is generally detectable in upper respiratory specimens during the acute phase of infection. The lowest concentration of SARS-CoV-2 viral copies this assay can detect is 138 copies/mL. A negative result does not preclude SARS-Cov-2 infection and should not be used as the sole basis for treatment or other patient management decisions. A negative result may occur with  improper specimen collection/handling, submission of specimen other than nasopharyngeal swab, presence of viral mutation(s) within the areas targeted by this assay, and inadequate number of viral copies(<138 copies/mL). A negative result must be combined with clinical observations, patient history, and epidemiological information. The expected result is Negative.  Fact Sheet for Patients:   EntrepreneurPulse.com.au  Fact Sheet for Healthcare Providers:  IncredibleEmployment.be  This test is no t yet approved or cleared by the [place] FDA and  has been authorized for detection and/or diagnosis of SARS-CoV-2 by FDA under an Emergency Use Authorization (EUA). This EUA will remain  in effect (meaning this test can be used) for the duration of the COVID-19 declaration under Section 564(b)(1) of the Act, 21 U.S.C.section 360bbb-3(b)(1), unless the authorization is terminated  or revoked sooner.       Influenza A by PCR NEGATIVE NEGATIVE Final   Influenza B by PCR NEGATIVE NEGATIVE Final    Comment: (NOTE) The Xpert Xpress SARS-CoV-2/FLU/RSV plus assay is intended as an aid in the diagnosis of influenza from Nasopharyngeal swab specimens and should not be used as a sole basis for treatment. Nasal washings and aspirates are unacceptable for Xpert Xpress SARS-CoV-2/FLU/RSV testing.  Fact Sheet for Patients: EntrepreneurPulse.com.au  Fact Sheet for Healthcare Providers: IncredibleEmployment.be  This test is not yet approved or cleared by the [place] FDA and has been authorized for detection and/or diagnosis of SARS-CoV-2 by FDA under an Emergency Use Authorization (EUA). This EUA will remain in effect (meaning this test can be used) for the duration of the COVID-19 declaration under Section 564(b)(1) of the Act, 21 U.S.C. section 360bbb-3(b)(1), unless the authorization is terminated or revoked.  Performed at [facility], [address] [place], [place] [address]   Blood Culture (routine x 2)     Status: None (Preliminary result)   Collection Time: 10/07/20 11:56 AM   Specimen: BLOOD  Result Value Ref Range Status   Specimen Description BLOOD RIGHT HAND  Final   Special Requests   Final    BOTTLES DRAWN AEROBIC AND ANAEROBIC Blood Culture adequate volume   Culture    Final    NO GROWTH 2 DAYS Performed at [facility], [address], [place], [place] [address]    Report Status PENDING  Incomplete  Urine culture     Status: Abnormal (Preliminary result)   Collection Time: 10/08/20  9:20 AM   Specimen: Urine, Random  Result Value Ref Range Status   Specimen Description   Final    URINE, RANDOM Performed at [facility], [address], [place], [place] [address]    Special Requests   Final    NONE Performed at [facility], [address] [place], [place] [address]    Culture (A)  Final    10,000 COLONIES/mL STAPHYLOCOCCUS LUGDUNENSIS SUSCEPTIBILITIES TO FOLLOW Performed at [facility],  [address], [place], [place] [address]    Report Status PENDING  Incomplete  MRSA PCR Screening     Status: None   Collection Time: 10/08/20 11:36 AM   Specimen: Nasal Mucosa; Nasopharyngeal  Result Value Ref Range Status   MRSA by PCR NEGATIVE NEGATIVE Final    Comment:        The GeneXpert MRSA Assay (FDA approved for NASAL specimens only), is one component of a comprehensive MRSA colonization surveillance program. It is not intended to diagnose MRSA infection nor to guide or monitor treatment for MRSA infections. Performed at [facility], [address], [place], [place] [address]          Radiology Studies: US Venous Img Upper Uni Right(DVT)  Result Date: 10/08/2020 CLINICAL DATA:  History of stage IV breast cancer now with right upper extremity pain and edema. Evaluate for DVT. EXAM: RIGHT UPPER EXTREMITY VENOUS DOPPLER ULTRASOUND TECHNIQUE: Gray-scale sonography with graded compression, as well as color Doppler and duplex ultrasound were performed to evaluate the upper extremity deep venous system from the level of the subclavian vein and including the jugular, axillary, basilic, radial, ulnar and upper cephalic vein. Spectral Doppler was utilized to evaluate flow at rest and with distal  augmentation maneuvers. COMPARISON:  None. FINDINGS: Contralateral Subclavian Vein: Respiratory phasicity is normal and symmetric with the symptomatic side. No evidence of thrombus. Normal compressibility. Internal Jugular Vein: No evidence of thrombus. Normal compressibility, respiratory phasicity and response to augmentation. Subclavian Vein: No evidence of thrombus. Normal compressibility, respiratory phasicity and response to augmentation. Axillary Vein: Appears patent where imaged though evaluation degraded secondary to patient's discomfort with sonographic evaluation of the axilla. Cephalic Vein: No evidence of thrombus. Normal compressibility, respiratory phasicity and response to augmentation. Basilic Vein: No evidence of thrombus. Normal compressibility, respiratory phasicity and response to augmentation. Brachial Veins: No evidence of thrombus within either of the paired brachial veins. Normal compressibility, respiratory phasicity and response to augmentation. Radial Veins: No evidence of thrombus. Normal compressibility, respiratory phasicity and response to augmentation. Ulnar Veins: No evidence of thrombus. Normal compressibility, respiratory phasicity and response to augmentation. Venous Reflux:  None visualized. Other Findings: There is a moderate amount of subcutaneous edema at the level of the right upper arm. IMPRESSION: No evidence of DVT within the right upper extremity. Electronically Signed   By: Sandi Mariscal M.D.   On: 10/08/2020 14:29        Scheduled Meds: . calcium carbonate  1,250 mg Oral Q breakfast  . enoxaparin (LOVENOX) injection  40 mg Subcutaneous Q24H  . feeding supplement  237 mL Oral TID BM  . ferrous sulfate  325 mg Oral BID WC  . magnesium chloride  1 tablet Oral Daily  . melatonin  5 mg Oral QHS  . metoprolol tartrate  50 mg Oral BID   Continuous Infusions: . ceFEPime (MAXIPIME) IV 2 g (10/09/20 1320)     LOS: 2 days    Time spent: 28 minutes    Sharen Hones, MD [facility]   To contact the attending provider between 7A-7P or the covering provider during after hours 7P-7A, please log into the web site [email or web address] and access using universal [place] password for that web site. If you do not have the password, please call the hospital operator.  10/09/2020, 1:27 PM

## 2020-10-10 ENCOUNTER — Inpatient Hospital Stay (HOSPITAL_COMMUNITY)
Admit: 2020-10-10 | Discharge: 2020-10-10 | Disposition: A | Discharge status: Home / Self Care | Payer: Medicaid Other | Attending: Internal Medicine | Admitting: Internal Medicine

## 2020-10-10 ENCOUNTER — Ambulatory Visit: Payer: Medicaid Other | Admitting: Cardiology

## 2020-10-10 DIAGNOSIS — J9601 Acute respiratory failure with hypoxia: Secondary | ICD-10-CM | POA: Diagnosis not present

## 2020-10-10 DIAGNOSIS — D509 Iron deficiency anemia, unspecified: Secondary | ICD-10-CM | POA: Diagnosis not present

## 2020-10-10 DIAGNOSIS — R0603 Acute respiratory distress: Secondary | ICD-10-CM

## 2020-10-10 DIAGNOSIS — J189 Pneumonia, unspecified organism: Secondary | ICD-10-CM | POA: Diagnosis not present

## 2020-10-10 LAB — CBC WITH DIFFERENTIAL/PLATELET
Abs Immature Granulocytes: 0.58 10*3/uL — ABNORMAL HIGH (ref 0.00–0.07)
Basophils Absolute: 0.1 10*3/uL (ref 0.0–0.1)
Basophils Relative: 0 %
Eosinophils Absolute: 0.1 10*3/uL (ref 0.0–0.5)
Eosinophils Relative: 0 %
HCT: 34.5 % — ABNORMAL LOW (ref 36.0–46.0)
Hemoglobin: 10.2 g/dL — ABNORMAL LOW (ref 12.0–15.0)
Immature Granulocytes: 3 %
Lymphocytes Relative: 7 %
Lymphs Abs: 1.5 10*3/uL (ref 0.7–4.0)
MCH: 24.8 pg — ABNORMAL LOW (ref 26.0–34.0)
MCHC: 29.6 g/dL — ABNORMAL LOW (ref 30.0–36.0)
MCV: 83.7 fL (ref 80.0–100.0)
Monocytes Absolute: 1.3 10*3/uL — ABNORMAL HIGH (ref 0.1–1.0)
Monocytes Relative: 6 %
Neutro Abs: 18.9 10*3/uL — ABNORMAL HIGH (ref 1.7–7.7)
Neutrophils Relative %: 84 %
Platelets: 344 10*3/uL (ref 150–400)
RBC: 4.12 MIL/uL (ref 3.87–5.11)
RDW: 16.4 % — ABNORMAL HIGH (ref 11.5–15.5)
WBC: 22.3 10*3/uL — ABNORMAL HIGH (ref 4.0–10.5)
nRBC: 0 % (ref 0.0–0.2)

## 2020-10-10 LAB — ECHOCARDIOGRAM COMPLETE
AV Mean grad: 4 mmHg
AV Peak grad: 6.8 mmHg
Ao pk vel: 1.3 m/s
Area-P 1/2: 6.96 cm2
Height: 70 in
Single Plane A4C EF: 41.3 %
Weight: 2859.2 oz

## 2020-10-10 LAB — URINE CULTURE: Culture: 10000 — AB

## 2020-10-10 LAB — LEGIONELLA PNEUMOPHILA SEROGP 1 UR AG: L. pneumophila Serogp 1 Ur Ag: NEGATIVE

## 2020-10-10 MED ORDER — HEPARIN SOD (PORK) LOCK FLUSH 100 UNIT/ML IV SOLN
500.0000 [IU] | INTRAVENOUS | Status: AC | PRN
  Administered 2020-10-10: 500 [IU]
  Filled 2020-10-10: qty 5

## 2020-10-10 MED ORDER — CHLORHEXIDINE GLUCONATE CLOTH 2 % EX PADS
6.0000 | MEDICATED_PAD | Freq: Every day | CUTANEOUS | Status: DC
  Administered 2020-10-11 – 2020-10-21 (×10): 6 via TOPICAL

## 2020-10-10 MED ORDER — SODIUM CHLORIDE 0.9% FLUSH
10.0000 mL | INTRAVENOUS | Status: DC | PRN
  Administered 2020-10-10: 10 mL

## 2020-10-10 MED ORDER — SENNOSIDES-DOCUSATE SODIUM 8.6-50 MG PO TABS
2.0000 | ORAL_TABLET | Freq: Two times a day (BID) | ORAL | Status: DC
  Administered 2020-10-10 – 2020-10-12 (×6): 2 via ORAL
  Filled 2020-10-10 (×6): qty 2

## 2020-10-10 MED ORDER — SODIUM CHLORIDE 0.9% FLUSH
10.0000 mL | Freq: Two times a day (BID) | INTRAVENOUS | Status: DC
  Administered 2020-10-10 – 2020-10-21 (×22): 10 mL

## 2020-10-10 MED ORDER — FUROSEMIDE 40 MG PO TABS
40.0000 mg | ORAL_TABLET | Freq: Once | ORAL | Status: AC
  Administered 2020-10-10: 40 mg via ORAL
  Filled 2020-10-10: qty 1

## 2020-10-10 NOTE — Progress Notes (Addendum)
PROGRESS NOTE    Jessica Willis  KVQ:[id number] DOB: May 06, 1966 DOA: 10/07/2020 PCP: [place]   Chief complaint.  Shortness of breath Brief Narrative:  Jessica Willis a 55 y.o.femalewith medical history significant ofstage IV metastasized breast cancer on chemotherapy, tachycardia, GERD, anemia, malignant pleural effusion, hemothorax, who presents with shortness of breath. Upon arriving the hospital, she was a found to have significant leukocytosis at 22.3, lactic acidosis of 2.3, she also has tachycardia of 134 tachypnea of 33. Profound elevation of procalcitonin level. Chest CT scan showed pneumonia of the right middle lobe and lower lobe. PE ruled out. Patient was placed on vancomycin, cefepime for healthcare associated pneumonia. Vancomycin discontinued after MRSA culture negative.   Assessment & Plan:   Principal Problem:   HCAP (healthcare-associated pneumonia) Active Problems:   Severe sepsis ([name])   Acute respiratory failure with hypoxia (Jessica Willis)   Metastatic breast cancer ([facility])   Tachycardia   Iron deficiency anemia  #1.  Acute respiratory failure with hypoxemia. Severe sepsis secondary to healthcare associate pneumonia Right-sided pneumonia. Patient still has significant hypoxemia on 4 L oxygen, significant short of breath with exertion Continue cefepime for now. Patient also had a significant sinus tachycardia with minimal exertion, probably from debility.  Patient has minimal elevation of BNP, I will give 40 mg oral Lasix.  2.  Metastatic breast cancer. Sinus tachycardia. Continue metoprolol 50 mg twice a day. Echocardiogram showed ejection fraction 50 to 55%, no evidence of pericardial effusion.  Iron deficient anemia. Reactive thrombocytosis. Stable.     DVT prophylaxis: Lovenox Code Status: Full Family Communication:  Disposition Plan:  .   Status is: Inpatient  Remains inpatient appropriate because:Inpatient  level of care appropriate due to severity of illness   Dispo: The patient is from: Home              Anticipated d/c is to: Home              Patient currently is not medically stable to d/c.   Difficult to place patient No        I/O last 3 completed shifts: In: 6433 [P.O.:1067; IV Piggyback:600] Out: 2951 [Urine:1450] No intake/output data recorded.     Consultants:   None  Procedures: None  Antimicrobials: cefepime  Subjective: Patient still has edema short of breath with exertion, hypoxia and tachycardia with minimal exertion. No chest pain. No fever or chills. No dysuria hematuria. No abdominal pain or nausea vomiting.  No BM for the last 3 days, start the senna.  Objective: Vitals:   10/10/20 0200 10/10/20 0411 10/10/20 0901 10/10/20 1110  BP: 128/70 117/68 139/79 119/79  Pulse: (!) 111 (!) 115 (!) 130 (!) 106  Resp:  17  20  Temp: 98.5 F (36.9 C) 97.7 F (36.5 C) 97.8 F (36.6 C) 98.8 F (37.1 C)  TempSrc: Oral Oral Oral Oral  SpO2: 95% 93% 96% 97%  Weight:  81.1 kg    Height:        Intake/Output Summary (Last 24 hours) at 10/10/2020 1411 Last data filed at 10/10/2020 0700 Gross per 24 hour  Intake 300 ml  Output 500 ml  Net -200 ml   Filed Weights   10/08/20 0304 10/09/20 0303 10/10/20 0411  Weight: 79.2 kg 79.9 kg 81.1 kg    Examination:  General exam: Appears calm and comfortable  Respiratory system: Small crackles in the base. Respiratory effort normal. Cardiovascular system: Regular and tachycardic, no JVD, murmurs, rubs, gallops or clicks. No  pedal edema. Gastrointestinal system: Abdomen is nondistended, soft and nontender. No organomegaly or masses felt. Normal bowel sounds heard. Central nervous system: Alert and oriented. No focal neurological deficits. Extremities: Symmetric  Skin: No rashes, lesions or ulcers Psychiatry:  Mood & affect appropriate.     Data Reviewed: I have personally reviewed following labs and imaging  studies  CBC: Recent Labs  Lab 10/07/20 0843 10/07/20 1025 10/08/20 0450 10/09/20 0518 10/10/20 0445  WBC 23.1* 22.3* 25.6* 23.1* 22.3*  NEUTROABS 19.8* 19.3*  --  19.7* 18.9*  HGB 10.7* 11.0* 11.1* 9.9* 10.2*  HCT 34.5* 36.1 35.8* 32.9* 34.5*  MCV 81.2 81.7 82.5 83.1 83.7  PLT 412* 414* 413* 382 195   Basic Metabolic Panel: Recent Labs  Lab 10/07/20 0843 10/07/20 1025 10/08/20 0450 10/09/20 0518  NA 137 138 139 138  K 4.3 4.3 3.8 4.3  CL 101 104 106 105  CO2 23 25 24 25   GLUCOSE 123* 121* 99 117*  BUN 17 16 14 15   CREATININE 0.70 0.61 0.63 0.46  CALCIUM 8.9 9.2 8.2* 8.5*  MG  --   --   --  1.9  PHOS  --   --   --  3.2   GFR: Estimated Creatinine Clearance: 85.9 mL/min (by C-G formula based on SCr of 0.46 mg/dL). Liver Function Tests: Recent Labs  Lab 10/07/20 0843 10/07/20 1025  AST 71* 69*  ALT 22 21  ALKPHOS 132* 141*  BILITOT 0.5 0.5  PROT 7.2 7.0  ALBUMIN 2.9* 2.9*   No results for input(s): LIPASE, AMYLASE in the last 168 hours. No results for input(s): AMMONIA in the last 168 hours. Coagulation Profile: Recent Labs  Lab 10/07/20 1025  INR 1.3*   Cardiac Enzymes: No results for input(s): CKTOTAL, CKMB, CKMBINDEX, TROPONINI in the last 168 hours. BNP (last 3 results) No results for input(s): PROBNP in the last 8760 hours. HbA1C: No results for input(s): HGBA1C in the last 72 hours. CBG: No results for input(s): GLUCAP in the last 168 hours. Lipid Profile: No results for input(s): CHOL, HDL, LDLCALC, TRIG, CHOLHDL, LDLDIRECT in the last 72 hours. Thyroid Function Tests: No results for input(s): TSH, T4TOTAL, FREET4, T3FREE, THYROIDAB in the last 72 hours. Anemia Panel: No results for input(s): VITAMINB12, FOLATE, FERRITIN, TIBC, IRON, RETICCTPCT in the last 72 hours. Sepsis Labs: Recent Labs  Lab 10/07/20 1139 10/07/20 1340 10/07/20 1746 10/09/20 0518  PROCALCITON  --   --  87.91 66.68  LATICACIDVEN 2.3* 1.5 2.4*  --     Recent  Results (from the past 240 hour(s))  Blood Culture (routine x 2)     Status: None (Preliminary result)   Collection Time: 10/07/20 11:39 AM   Specimen: BLOOD  Result Value Ref Range Status   Specimen Description BLOOD LEFT WRIST  Final   Special Requests   Final    BOTTLES DRAWN AEROBIC AND ANAEROBIC Blood Culture adequate volume   Culture   Final    NO GROWTH 3 DAYS Performed at [facility], [address] [place],  [address]    Report Status PENDING  Incomplete  Resp Panel by RT-PCR (Flu A&B, Covid) Nasopharyngeal Swab     Status: None   Collection Time: 10/07/20 11:56 AM   Specimen: Nasopharyngeal Swab; Nasopharyngeal(NP) swabs in vial transport medium  Result Value Ref Range Status   SARS Coronavirus 2 by RT PCR NEGATIVE NEGATIVE Final    Comment: (NOTE) SARS-CoV-2 target nucleic acids are NOT DETECTED.  The SARS-CoV-2  RNA is generally detectable in upper respiratory specimens during the acute phase of infection. The lowest concentration of SARS-CoV-2 viral copies this assay can detect is 138 copies/mL. A negative result does not preclude SARS-Cov-2 infection and should not be used as the sole basis for treatment or other patient management decisions. A negative result may occur with  improper specimen collection/handling, submission of specimen other than nasopharyngeal swab, presence of viral mutation(s) within the areas targeted by this assay, and inadequate number of viral copies(<138 copies/mL). A negative result must be combined with clinical observations, patient history, and epidemiological information. The expected result is Negative.  Fact Sheet for Patients:  EntrepreneurPulse.com.au  Fact Sheet for Healthcare Providers:  IncredibleEmployment.be  This test is no t yet approved or cleared by the [place] FDA and  has been authorized for detection and/or diagnosis of SARS-CoV-2 by FDA under an Emergency  Use Authorization (EUA). This EUA will remain  in effect (meaning this test can be used) for the duration of the COVID-19 declaration under Section 564(b)(1) of the Act, 21 U.S.C.section 360bbb-3(b)(1), unless the authorization is terminated  or revoked sooner.       Influenza A by PCR NEGATIVE NEGATIVE Final   Influenza B by PCR NEGATIVE NEGATIVE Final    Comment: (NOTE) The Xpert Xpress SARS-CoV-2/FLU/RSV plus assay is intended as an aid in the diagnosis of influenza from Nasopharyngeal swab specimens and should not be used as a sole basis for treatment. Nasal washings and aspirates are unacceptable for Xpert Xpress SARS-CoV-2/FLU/RSV testing.  Fact Sheet for Patients: EntrepreneurPulse.com.au  Fact Sheet for Healthcare Providers: IncredibleEmployment.be  This test is not yet approved or cleared by the [place] FDA and has been authorized for detection and/or diagnosis of SARS-CoV-2 by FDA under an Emergency Use Authorization (EUA). This EUA will remain in effect (meaning this test can be used) for the duration of the COVID-19 declaration under Section 564(b)(1) of the Act, 21 U.S.C. section 360bbb-3(b)(1), unless the authorization is terminated or revoked.  Performed at [facility], [address] [place], [place] [address]   Blood Culture (routine x 2)     Status: None (Preliminary result)   Collection Time: 10/07/20 11:56 AM   Specimen: BLOOD  Result Value Ref Range Status   Specimen Description BLOOD RIGHT HAND  Final   Special Requests   Final    BOTTLES DRAWN AEROBIC AND ANAEROBIC Blood Culture adequate volume   Culture   Final    NO GROWTH 3 DAYS Performed at [facility], [address] [place], [place] [address]    Report Status PENDING  Incomplete  Urine culture     Status: Abnormal   Collection Time: 10/08/20  9:20 AM   Specimen: Urine, Random  Result Value Ref Range Status   Specimen  Description   Final    URINE, RANDOM Performed at [facility], [address] [place], [place] [address]    Special Requests   Final    NONE Performed at [facility], [address] [place], [place] [address]    Culture 10,000 COLONIES/mL STAPHYLOCOCCUS LUGDUNENSIS (A)  Final   Report Status 10/10/2020 FINAL  Final   Organism ID, Bacteria STAPHYLOCOCCUS LUGDUNENSIS (A)  Final      Susceptibility   Staphylococcus lugdunensis - MIC*    CIPROFLOXACIN <=0.5 SENSITIVE Sensitive     GENTAMICIN <=0.5 SENSITIVE Sensitive     NITROFURANTOIN <=16 SENSITIVE Sensitive     OXACILLIN 0.5 SENSITIVE Sensitive  TETRACYCLINE <=1 SENSITIVE Sensitive     VANCOMYCIN <=0.5 SENSITIVE Sensitive     TRIMETH/SULFA <=10 SENSITIVE Sensitive     CLINDAMYCIN RESISTANT Resistant     RIFAMPIN <=0.5 SENSITIVE Sensitive     Inducible Clindamycin POSITIVE Resistant     * 10,000 COLONIES/mL STAPHYLOCOCCUS LUGDUNENSIS  MRSA PCR Screening     Status: None   Collection Time: 10/08/20 11:36 AM   Specimen: Nasal Mucosa; Nasopharyngeal  Result Value Ref Range Status   MRSA by PCR NEGATIVE NEGATIVE Final    Comment:        The GeneXpert MRSA Assay (FDA approved for NASAL specimens only), is one component of a comprehensive MRSA colonization surveillance program. It is not intended to diagnose MRSA infection nor to guide or monitor treatment for MRSA infections. Performed at [facility], [address], [place], [place] [address]          Radiology Studies: US Venous Img Upper Uni Right(DVT)  Result Date: 10/08/2020 CLINICAL DATA:  History of stage IV breast cancer now with right upper extremity pain and edema. Evaluate for DVT. EXAM: RIGHT UPPER EXTREMITY VENOUS DOPPLER ULTRASOUND TECHNIQUE: Gray-scale sonography with graded compression, as well as color Doppler and duplex ultrasound were performed to evaluate the upper extremity deep venous system from the level of the  subclavian vein and including the jugular, axillary, basilic, radial, ulnar and upper cephalic vein. Spectral Doppler was utilized to evaluate flow at rest and with distal augmentation maneuvers. COMPARISON:  None. FINDINGS: Contralateral Subclavian Vein: Respiratory phasicity is normal and symmetric with the symptomatic side. No evidence of thrombus. Normal compressibility. Internal Jugular Vein: No evidence of thrombus. Normal compressibility, respiratory phasicity and response to augmentation. Subclavian Vein: No evidence of thrombus. Normal compressibility, respiratory phasicity and response to augmentation. Axillary Vein: Appears patent where imaged though evaluation degraded secondary to patient's discomfort with sonographic evaluation of the axilla. Cephalic Vein: No evidence of thrombus. Normal compressibility, respiratory phasicity and response to augmentation. Basilic Vein: No evidence of thrombus. Normal compressibility, respiratory phasicity and response to augmentation. Brachial Veins: No evidence of thrombus within either of the paired brachial veins. Normal compressibility, respiratory phasicity and response to augmentation. Radial Veins: No evidence of thrombus. Normal compressibility, respiratory phasicity and response to augmentation. Ulnar Veins: No evidence of thrombus. Normal compressibility, respiratory phasicity and response to augmentation. Venous Reflux:  None visualized. Other Findings: There is a moderate amount of subcutaneous edema at the level of the right upper arm. IMPRESSION: No evidence of DVT within the right upper extremity. Electronically Signed   By: Sandi Mariscal M.D.   On: 10/08/2020 14:29   ECHOCARDIOGRAM COMPLETE  Result Date: 10/10/2020    ECHOCARDIOGRAM REPORT   Patient Name:   RHAYA COALE Date of Exam: 10/10/2020 Medical Rec #:  [id number]           Height:       70.0 in Accession #:    [id number]          Weight:       178.7 lb Date of Birth:  08/10/1966            BSA:          1.990 m Patient Age:    70 years            BP:           119/79 mmHg Patient Gender: F  HR:           105 bpm. Exam Location:  [facility] Procedure: 2D Echo, Limited Color Doppler and Cardiac Doppler Indications:     R06.03 Acute Respiratory Distress  History:         Patient has no prior history of Echocardiogram examinations.                  Signs/Symptoms:Shortness of Breath. Breast cancer.  Sonographer:     Charmayne Sheer RDCS (AE) Referring Phys:  [id number] Sharen Hones Diagnosing Phys: Kathlyn Sacramento MD  Sonographer Comments: Technically challenging study due to limited acoustic windows. IMPRESSIONS  1. Left ventricular ejection fraction, by estimation, is 50 to 55%. The left ventricle has low normal function. Left ventricular endocardial border not optimally defined to evaluate regional wall motion. Left ventricular diastolic parameters are indeterminate.  2. Right ventricular systolic function is normal. The right ventricular size is normal. Tricuspid regurgitation signal is inadequate for assessing PA pressure.  3. The mitral valve is normal in structure. No evidence of mitral valve regurgitation. No evidence of mitral stenosis.  4. The aortic valve is normal in structure. Aortic valve regurgitation is not visualized. No aortic stenosis is present.  5. The inferior vena cava is normal in size with greater than 50% respiratory variability, suggesting right atrial pressure of 3 mmHg. FINDINGS  Left Ventricle: Left ventricular ejection fraction, by estimation, is 50 to 55%. The left ventricle has low normal function. Left ventricular endocardial border not optimally defined to evaluate regional wall motion. The left ventricular internal cavity  size was normal in size. There is no left ventricular hypertrophy. Left ventricular diastolic parameters are indeterminate. Right Ventricle: The right ventricular size is normal. No increase in right ventricular wall thickness. Right ventricular  systolic function is normal. Tricuspid regurgitation signal is inadequate for assessing PA pressure. Left Atrium: Left atrial size was normal in size. Right Atrium: Right atrial size was normal in size. Pericardium: There is no evidence of pericardial effusion. Mitral Valve: The mitral valve is normal in structure. No evidence of mitral valve regurgitation. No evidence of mitral valve stenosis. MV peak gradient, 4.5 mmHg. The mean mitral valve gradient is 2.0 mmHg. Tricuspid Valve: The tricuspid valve is normal in structure. Tricuspid valve regurgitation is not demonstrated. No evidence of tricuspid stenosis. Aortic Valve: The aortic valve is normal in structure. Aortic valve regurgitation is not visualized. No aortic stenosis is present. Aortic valve mean gradient measures 4.0 mmHg. Aortic valve peak gradient measures 6.8 mmHg. Pulmonic Valve: The pulmonic valve was not well visualized. Pulmonic valve regurgitation is not visualized. No evidence of pulmonic stenosis. Aorta: The aortic root is normal in size and structure. Venous: The inferior vena cava is normal in size with greater than 50% respiratory variability, suggesting right atrial pressure of 3 mmHg. IAS/Shunts: No atrial level shunt detected by color flow Doppler.   LV Volumes (MOD) LV vol d, MOD A4C: 99.4 ml Diastology LV vol s, MOD A4C: 58.3 ml LV e' lateral:   10.30 cm/s LV SV MOD A4C:     99.4 ml LV E/e' lateral: 5.8  LEFT ATRIUM           Index LA Vol (A4C): 41.5 ml 20.86 ml/m  AORTIC VALVE AV Vmax:           130.00 cm/s AV Vmean:          93.400 cm/s AV VTI:            0.243  m AV Peak Grad:      6.8 mmHg AV Mean Grad:      4.0 mmHg LVOT Vmax:         92.70 cm/s LVOT Vmean:        60.500 cm/s LVOT VTI:          0.151 m LVOT/AV VTI ratio: 0.62 MITRAL VALVE MV Area (PHT): 6.96 cm    SHUNTS MV Peak grad:  4.5 mmHg    Systemic VTI: 0.15 m MV Mean grad:  2.0 mmHg MV Vmax:       1.06 m/s MV Vmean:      67.9 cm/s MV Decel Time: 109 msec MV E velocity:  59.60 cm/s MV A velocity: 68.60 cm/s MV E/A ratio:  0.87 Kathlyn Sacramento MD Electronically signed by Kathlyn Sacramento MD Signature Date/Time: 10/10/2020/2:00:06 PM    Final         Scheduled Meds: . calcium carbonate  1,250 mg Oral Q breakfast  . enoxaparin (LOVENOX) injection  40 mg Subcutaneous Q24H  . feeding supplement  237 mL Oral TID BM  . ferrous sulfate  325 mg Oral BID WC  . furosemide  40 mg Oral Once  . magnesium chloride  1 tablet Oral Daily  . melatonin  5 mg Oral QHS  . metoprolol tartrate  50 mg Oral BID   Continuous Infusions: . ceFEPime (MAXIPIME) IV 2 g (10/10/20 1337)     LOS: 3 days    Time spent: 28 minutes    Sharen Hones, MD [facility]   To contact the attending provider between 7A-7P or the covering provider during after hours 7P-7A, please log into the web site [email or web address] and access using universal [place] password for that web site. If you do not have the password, please call the hospital operator.  10/10/2020, 2:11 PM

## 2020-10-10 NOTE — Plan of Care (Signed)
  Problem: Education: Goal: Knowledge of General Education information will improve Description: Including pain rating scale, medication(s)/side effects and non-pharmacologic comfort measures Outcome: Progressing   Problem: Health Behavior/Discharge Planning: Goal: Ability to manage health-related needs will improve Outcome: Progressing Up on entering room pt sats on the 50's, pt complaining of sob, per pt had just got up to use BSC, NRB mask applied, O2 sats improving. Purewick catheter applied. Problem: Activity: Goal: Risk for activity intolerance will decrease Outcome: Not Progressing

## 2020-10-10 NOTE — Progress Notes (Signed)
*  PRELIMINARY RESULTS* Echocardiogram 2D Echocardiogram has been performed.  Jessica Willis Jessica Willis 10/10/2020, 12:33 PM

## 2020-10-10 NOTE — Consult Note (Signed)
[facility] Nurse Consult Note: Consult requested for right fungulating breast mass. Pt is familiar to [facility] team form previous consult on 11/15. Performed remotely after review of progress notes in the EMR.  Topical treatment will not promote healing and goals are directed to absorbing drainage and decreasing discomfort and adherence with dressing changes.    Topical treatment orders provided for bedside nurses to perform as follows: Apply double-folded xeroform gauze to right breast Q day, then cover with ABD pads.  Cut the crotch out of a pair of mesh underwear to create a "tank top" and use this to hold dressings in place, and avoid use of tape. Moisten dressing each time with NS to assist with removal.  Please re-consult if further assistance is needed.  Thank-you,  Julien Girt MSN, [place], [place], [place], [place]

## 2020-10-11 DIAGNOSIS — J189 Pneumonia, unspecified organism: Secondary | ICD-10-CM

## 2020-10-11 DIAGNOSIS — D72829 Elevated white blood cell count, unspecified: Secondary | ICD-10-CM

## 2020-10-11 DIAGNOSIS — A419 Sepsis, unspecified organism: Secondary | ICD-10-CM | POA: Diagnosis not present

## 2020-10-11 DIAGNOSIS — J9601 Acute respiratory failure with hypoxia: Secondary | ICD-10-CM | POA: Diagnosis not present

## 2020-10-11 DIAGNOSIS — C50919 Malignant neoplasm of unspecified site of unspecified female breast: Secondary | ICD-10-CM

## 2020-10-11 DIAGNOSIS — R000 Tachycardia, unspecified: Secondary | ICD-10-CM

## 2020-10-11 DIAGNOSIS — Z7189 Other specified counseling: Secondary | ICD-10-CM

## 2020-10-11 LAB — CBC WITH DIFFERENTIAL/PLATELET
Abs Immature Granulocytes: 0.58 10*3/uL — ABNORMAL HIGH (ref 0.00–0.07)
Basophils Absolute: 0.1 10*3/uL (ref 0.0–0.1)
Basophils Relative: 0 %
Eosinophils Absolute: 0 10*3/uL (ref 0.0–0.5)
Eosinophils Relative: 0 %
HCT: 33.5 % — ABNORMAL LOW (ref 36.0–46.0)
Hemoglobin: 10.4 g/dL — ABNORMAL LOW (ref 12.0–15.0)
Immature Granulocytes: 3 %
Lymphocytes Relative: 7 %
Lymphs Abs: 1.5 10*3/uL (ref 0.7–4.0)
MCH: 25.7 pg — ABNORMAL LOW (ref 26.0–34.0)
MCHC: 31 g/dL (ref 30.0–36.0)
MCV: 82.9 fL (ref 80.0–100.0)
Monocytes Absolute: 1.2 10*3/uL — ABNORMAL HIGH (ref 0.1–1.0)
Monocytes Relative: 6 %
Neutro Abs: 18.3 10*3/uL — ABNORMAL HIGH (ref 1.7–7.7)
Neutrophils Relative %: 84 %
Platelets: 349 10*3/uL (ref 150–400)
RBC: 4.04 MIL/uL (ref 3.87–5.11)
RDW: 16.6 % — ABNORMAL HIGH (ref 11.5–15.5)
WBC: 21.7 10*3/uL — ABNORMAL HIGH (ref 4.0–10.5)
nRBC: 0 % (ref 0.0–0.2)

## 2020-10-11 LAB — BASIC METABOLIC PANEL
Anion gap: 9 (ref 5–15)
BUN: 18 mg/dL (ref 6–20)
CO2: 29 mmol/L (ref 22–32)
Calcium: 8.5 mg/dL — ABNORMAL LOW (ref 8.9–10.3)
Chloride: 105 mmol/L (ref 98–111)
Creatinine, Ser: 0.35 mg/dL — ABNORMAL LOW (ref 0.44–1.00)
GFR, Estimated: >60 mL/min (ref >60)
Glucose, Bld: 91 mg/dL (ref 70–99)
Potassium: 4 mmol/L (ref 3.5–5.1)
Sodium: 143 mmol/L (ref 135–145)

## 2020-10-11 LAB — PROCALCITONIN: Procalcitonin: 68.14 ng/mL

## 2020-10-11 LAB — MAGNESIUM: Magnesium: 2.2 mg/dL (ref 1.7–2.4)

## 2020-10-11 MED ORDER — SODIUM CHLORIDE 0.9 % IV SOLN
1.5000 g | Freq: Four times a day (QID) | INTRAVENOUS | Status: DC
  Filled 2020-10-11 (×3): qty 4

## 2020-10-11 MED ORDER — SODIUM CHLORIDE 0.9 % IV SOLN
3.0000 g | Freq: Four times a day (QID) | INTRAVENOUS | Status: DC
  Administered 2020-10-11 – 2020-10-14 (×11): 3 g via INTRAVENOUS
  Filled 2020-10-11: qty 3
  Filled 2020-10-11: qty 8
  Filled 2020-10-11 (×3): qty 3
  Filled 2020-10-11 (×3): qty 8
  Filled 2020-10-11 (×2): qty 3
  Filled 2020-10-11 (×3): qty 8
  Filled 2020-10-11: qty 3
  Filled 2020-10-11: qty 8

## 2020-10-11 NOTE — Progress Notes (Addendum)
PROGRESS NOTE    Jessica Willis  YSA:[id number] DOB: Nov 09, 1965 DOA: 10/07/2020 PCP: [place]   CC: Shortness of breath Brief Narrative:  Jessica Willis a 55 y.o.femalewith medical history significant ofstage IV metastasized breast cancer on chemotherapy, tachycardia, GERD, anemia, malignant pleural effusion, hemothorax, who presents with shortness of breath. Upon arriving the hospital, she was a found to have significant leukocytosis at 22.3, lactic acidosis of 2.3, she also has tachycardia of 134 tachypnea of 33. Profound elevation of procalcitonin level. Chest CT scan showed pneumonia of the right middle lobe and lower lobe. PE ruled out. Patient was placed on vancomycin, cefepime for healthcare associated pneumonia. Vancomycin discontinued after MRSA culture negative. 3/1.  Patient still has significant hypoxemia, short of breath and tachycardia with minimal exertion.  Echocardiogram did not show any pericardial effusion.  Still on 3/5 days IV cefepime.  Oncology consulted.   Assessment & Plan:   Principal Problem:   HCAP (healthcare-associated pneumonia) Active Problems:   Severe sepsis ([place])   Acute respiratory failure with hypoxia ([place])   Metastatic breast cancer ([facility])   Tachycardia   Iron deficiency anemia  #1. Acute respiratory failure with hypoxemia. Severe sepsis secondary to healthcare associated pneumonia. Right sided pneumonia. Patient had a worsening oxygen requirement today to 6 L from 4 yesterday.  Based on previous CT angiogram of the chest, patient has significant metastasis from breast cancer.  Right side consolidation could be a pneumonia.  Therefore she is treated with cefepime, MRSA culture negative. She also had a significant elevation of procalcitonin level, this is mainly due to metastatic cancer. Her condition does not seem to be improving, oxygen requirement is worse today.  She still has significant tachycardia with  minimal exertion.  This could be due to severe debility. At this point, I will consult oncology, may consider repeat CT scan without contrast to see if lung metastasis is getting worse.  It is likely that her right sided consolidation could be from Cancer rather than pneumonia. Patient prognosis is poor.  #2.  Metastatic breast cancer. Sinus tachycardia Deconditioning. Is currently on metoprolol 50 mg twice a day.  Echocardiogram showed ejection fraction 50 to 55%, no evidence of pericardial effusion. Tachycardia is a sinus, worse with exertion, this has been present before admission for a while, most likely due to physical deconditioning. Consult of oncology for metastatic breast cancer.  3.  Iron deficient anemia. Reactive thrombocytosis. Stable, continue to follow.  D/W Dr.Ravishanker, patient her right breast has a very large necrotizing mass, this could be the reason she has significant leukocytosis, not necessarily infectious.  She may not have a true pneumonia.  Antibiotics switched to Unasyn.  DVT prophylaxis: Lovenox Code Status: full Family Communication: son updated Disposition Plan:  .   Status is: Inpatient  Remains inpatient appropriate because:Inpatient level of care appropriate due to severity of illness   Dispo: The patient is from: Home              Anticipated d/c is to: Home              Patient currently is not medically stable to d/c.   Difficult to place patient No        I/O last 3 completed shifts: In: 100 [IV Piggyback:100] Out: 600 [Urine:600] No intake/output data recorded.     Consultants:   oncology  Procedures: none  Antimicrobials: cefepime  Subjective: Patient had increased oxygen requirement up to 6 L today while at rest.  She  still has significant tachycardia with any exertion, hypoxia with any exertion.  She has no cough. She has severe weakness, She does not have any nausea vomiting.  She has some constipation, she passed  gas multiple times after stool softener, has not had a bowel movement for the last few days. No fever or chills. No dysuria hematuria  Objective: Vitals:   10/10/20 2358 10/11/20 0532 10/11/20 0745 10/11/20 0815  BP: 123/79 129/75 136/76   Pulse: (!) 111 (!) 121 (!) 126   Resp: 18 20 18    Temp: 97.9 F (36.6 C) 98.4 F (36.9 C) 98.1 F (36.7 C)   TempSrc: Oral Oral Oral   SpO2: (!) 89% 94% (!) 86% 91%  Weight:      Height:        Intake/Output Summary (Last 24 hours) at 10/11/2020 0923 Last data filed at 10/11/2020 0052 Gross per 24 hour  Intake --  Output 600 ml  Net -600 ml   Filed Weights   10/08/20 0304 10/09/20 0303 10/10/20 0411  Weight: 79.2 kg 79.9 kg 81.1 kg    Examination:  General exam: Appears calm and comfortable  Respiratory system: Decreased breathing sounds. Respiratory effort normal. Cardiovascular system: Regular and tachycardic.  No JVD, murmurs, rubs, gallops or clicks. No pedal edema. Gastrointestinal system: Abdomen is nondistended, soft and nontender. No organomegaly or masses felt. Normal bowel sounds heard. Central nervous system: Alert and oriented x3. No focal neurological deficits. Extremities: Symmetric  Skin: No rashes, lesions or ulcers Psychiatry:  Mood & affect appropriate.     Data Reviewed: I have personally reviewed following labs and imaging studies  CBC: Recent Labs  Lab 10/07/20 0843 10/07/20 1025 10/08/20 0450 10/09/20 0518 10/10/20 0445 10/11/20 0514  WBC 23.1* 22.3* 25.6* 23.1* 22.3* 21.7*  NEUTROABS 19.8* 19.3*  --  19.7* 18.9* 18.3*  HGB 10.7* 11.0* 11.1* 9.9* 10.2* 10.4*  HCT 34.5* 36.1 35.8* 32.9* 34.5* 33.5*  MCV 81.2 81.7 82.5 83.1 83.7 82.9  PLT 412* 414* 413* 382 344 742   Basic Metabolic Panel: Recent Labs  Lab 10/07/20 0843 10/07/20 1025 10/08/20 0450 10/09/20 0518 10/11/20 0514  NA 137 138 139 138 143  K 4.3 4.3 3.8 4.3 4.0  CL 101 104 106 105 105  CO2 23 25 24 25 29   GLUCOSE 123* 121* 99 117*  91  BUN 17 16 14 15 18   CREATININE 0.70 0.61 0.63 0.46 0.35*  CALCIUM 8.9 9.2 8.2* 8.5* 8.5*  MG  --   --   --  1.9 2.2  PHOS  --   --   --  3.2  --    GFR: Estimated Creatinine Clearance: 85.9 mL/min (A) (by C-G formula based on SCr of 0.35 mg/dL (L)). Liver Function Tests: Recent Labs  Lab 10/07/20 0843 10/07/20 1025  AST 71* 69*  ALT 22 21  ALKPHOS 132* 141*  BILITOT 0.5 0.5  PROT 7.2 7.0  ALBUMIN 2.9* 2.9*   No results for input(s): LIPASE, AMYLASE in the last 168 hours. No results for input(s): AMMONIA in the last 168 hours. Coagulation Profile: Recent Labs  Lab 10/07/20 1025  INR 1.3*   Cardiac Enzymes: No results for input(s): CKTOTAL, CKMB, CKMBINDEX, TROPONINI in the last 168 hours. BNP (last 3 results) No results for input(s): PROBNP in the last 8760 hours. HbA1C: No results for input(s): HGBA1C in the last 72 hours. CBG: No results for input(s): GLUCAP in the last 168 hours. Lipid Profile: No results for input(s): CHOL,  HDL, LDLCALC, TRIG, CHOLHDL, LDLDIRECT in the last 72 hours. Thyroid Function Tests: No results for input(s): TSH, T4TOTAL, FREET4, T3FREE, THYROIDAB in the last 72 hours. Anemia Panel: No results for input(s): VITAMINB12, FOLATE, FERRITIN, TIBC, IRON, RETICCTPCT in the last 72 hours. Sepsis Labs: Recent Labs  Lab 10/07/20 1139 10/07/20 1340 10/07/20 1746 10/09/20 0518 10/11/20 0514  PROCALCITON  --   --  87.91 66.68 68.14  LATICACIDVEN 2.3* 1.5 2.4*  --   --     Recent Results (from the past 240 hour(s))  Blood Culture (routine x 2)     Status: None (Preliminary result)   Collection Time: 10/07/20 11:39 AM   Specimen: BLOOD  Result Value Ref Range Status   Specimen Description BLOOD LEFT WRIST  Final   Special Requests   Final    BOTTLES DRAWN AEROBIC AND ANAEROBIC Blood Culture adequate volume   Culture   Final    NO GROWTH 4 DAYS Performed at [facility], [address], [place], [place] [address]    Report  Status PENDING  Incomplete  Resp Panel by RT-PCR (Flu A&B, Covid) Nasopharyngeal Swab     Status: None   Collection Time: 10/07/20 11:56 AM   Specimen: Nasopharyngeal Swab; Nasopharyngeal(NP) swabs in vial transport medium  Result Value Ref Range Status   SARS Coronavirus 2 by RT PCR NEGATIVE NEGATIVE Final    Comment: (NOTE) SARS-CoV-2 target nucleic acids are NOT DETECTED.  The SARS-CoV-2 RNA is generally detectable in upper respiratory specimens during the acute phase of infection. The lowest concentration of SARS-CoV-2 viral copies this assay can detect is 138 copies/mL. A negative result does not preclude SARS-Cov-2 infection and should not be used as the sole basis for treatment or other patient management decisions. A negative result may occur with  improper specimen collection/handling, submission of specimen other than nasopharyngeal swab, presence of viral mutation(s) within the areas targeted by this assay, and inadequate number of viral copies(<138 copies/mL). A negative result must be combined with clinical observations, patient history, and epidemiological information. The expected result is Negative.  Fact Sheet for Patients:  EntrepreneurPulse.com.au  Fact Sheet for Healthcare Providers:  IncredibleEmployment.be  This test is no t yet approved or cleared by the [place] FDA and  has been authorized for detection and/or diagnosis of SARS-CoV-2 by FDA under an Emergency Use Authorization (EUA). This EUA will remain  in effect (meaning this test can be used) for the duration of the COVID-19 declaration under Section 564(b)(1) of the Act, 21 U.S.C.section 360bbb-3(b)(1), unless the authorization is terminated  or revoked sooner.       Influenza A by PCR NEGATIVE NEGATIVE Final   Influenza B by PCR NEGATIVE NEGATIVE Final    Comment: (NOTE) The Xpert Xpress SARS-CoV-2/FLU/RSV plus assay is intended as an aid in the diagnosis  of influenza from Nasopharyngeal swab specimens and should not be used as a sole basis for treatment. Nasal washings and aspirates are unacceptable for Xpert Xpress SARS-CoV-2/FLU/RSV testing.  Fact Sheet for Patients: EntrepreneurPulse.com.au  Fact Sheet for Healthcare Providers: IncredibleEmployment.be  This test is not yet approved or cleared by the [place] FDA and has been authorized for detection and/or diagnosis of SARS-CoV-2 by FDA under an Emergency Use Authorization (EUA). This EUA will remain in effect (meaning this test can be used) for the duration of the COVID-19 declaration under Section 564(b)(1) of the Act, 21 U.S.C. section 360bbb-3(b)(1), unless the authorization is terminated or revoked.  Performed at [facility]  [facility], [place], [place] [address]   Blood Culture (routine x 2)     Status: None (Preliminary result)   Collection Time: 10/07/20 11:56 AM   Specimen: BLOOD  Result Value Ref Range Status   Specimen Description BLOOD RIGHT HAND  Final   Special Requests   Final    BOTTLES DRAWN AEROBIC AND ANAEROBIC Blood Culture adequate volume   Culture   Final    NO GROWTH 4 DAYS Performed at [facility], [address], [place], [place] [address]    Report Status PENDING  Incomplete  Urine culture     Status: Abnormal   Collection Time: 10/08/20  9:20 AM   Specimen: Urine, Random  Result Value Ref Range Status   Specimen Description   Final    URINE, RANDOM Performed at [facility] [address] [place], [place] [address]    Special Requests   Final    NONE Performed at [facility], [address], [place], [place] [address]    Culture 10,000 COLONIES/mL STAPHYLOCOCCUS LUGDUNENSIS (A)  Final   Report Status 10/10/2020 FINAL  Final   Organism ID, Bacteria STAPHYLOCOCCUS LUGDUNENSIS (A)  Final      Susceptibility   Staphylococcus lugdunensis - MIC*     CIPROFLOXACIN <=0.5 SENSITIVE Sensitive     GENTAMICIN <=0.5 SENSITIVE Sensitive     NITROFURANTOIN <=16 SENSITIVE Sensitive     OXACILLIN 0.5 SENSITIVE Sensitive     TETRACYCLINE <=1 SENSITIVE Sensitive     VANCOMYCIN <=0.5 SENSITIVE Sensitive     TRIMETH/SULFA <=10 SENSITIVE Sensitive     CLINDAMYCIN RESISTANT Resistant     RIFAMPIN <=0.5 SENSITIVE Sensitive     Inducible Clindamycin POSITIVE Resistant     * 10,000 COLONIES/mL STAPHYLOCOCCUS LUGDUNENSIS  MRSA PCR Screening     Status: None   Collection Time: 10/08/20 11:36 AM   Specimen: Nasal Mucosa; Nasopharyngeal  Result Value Ref Range Status   MRSA by PCR NEGATIVE NEGATIVE Final    Comment:        The GeneXpert MRSA Assay (FDA approved for NASAL specimens only), is one component of a comprehensive MRSA colonization surveillance program. It is not intended to diagnose MRSA infection nor to guide or monitor treatment for MRSA infections. Performed at [facility], [address], [place], [place] [address]          Radiology Studies: ECHOCARDIOGRAM COMPLETE  Result Date: 10/10/2020    ECHOCARDIOGRAM REPORT   Patient Name:   NASHAE MAUDLIN Date of Exam: 10/10/2020 Medical Rec #:  [id number]           Height:       70.0 in Accession #:    [id number]          Weight:       178.7 lb Date of Birth:  1965/09/03           BSA:          1.990 m Patient Age:    2 years            BP:           119/79 mmHg Patient Gender: F                   HR:           105 bpm. Exam Location:  [facility] Procedure: 2D Echo, Limited Color Doppler and Cardiac Doppler Indications:     R06.03 Acute Respiratory Distress  History:         Patient has no prior history of Echocardiogram examinations.                  Signs/Symptoms:Shortness of Breath. Breast cancer.  Sonographer:     Charmayne Sheer RDCS (AE) Referring Phys:  [id number] Sharen Hones Diagnosing Phys: Kathlyn Sacramento MD  Sonographer Comments: Technically challenging study due to limited  acoustic windows. IMPRESSIONS  1. Left ventricular ejection fraction, by estimation, is 50 to 55%. The left ventricle has low normal function. Left ventricular endocardial border not optimally defined to evaluate regional wall motion. Left ventricular diastolic parameters are indeterminate.  2. Right ventricular systolic function is normal. The right ventricular size is normal. Tricuspid regurgitation signal is inadequate for assessing PA pressure.  3. The mitral valve is normal in structure. No evidence of mitral valve regurgitation. No evidence of mitral stenosis.  4. The aortic valve is normal in structure. Aortic valve regurgitation is not visualized. No aortic stenosis is present.  5. The inferior vena cava is normal in size with greater than 50% respiratory variability, suggesting right atrial pressure of 3 mmHg. FINDINGS  Left Ventricle: Left ventricular ejection fraction, by estimation, is 50 to 55%. The left ventricle has low normal function. Left ventricular endocardial border not optimally defined to evaluate regional wall motion. The left ventricular internal cavity  size was normal in size. There is no left ventricular hypertrophy. Left ventricular diastolic parameters are indeterminate. Right Ventricle: The right ventricular size is normal. No increase in right ventricular wall thickness. Right ventricular systolic function is normal. Tricuspid regurgitation signal is inadequate for assessing PA pressure. Left Atrium: Left atrial size was normal in size. Right Atrium: Right atrial size was normal in size. Pericardium: There is no evidence of pericardial effusion. Mitral Valve: The mitral valve is normal in structure. No evidence of mitral valve regurgitation. No evidence of mitral valve stenosis. MV peak gradient, 4.5 mmHg. The mean mitral valve gradient is 2.0 mmHg. Tricuspid Valve: The tricuspid valve is normal in structure. Tricuspid valve regurgitation is not demonstrated. No evidence of tricuspid  stenosis. Aortic Valve: The aortic valve is normal in structure. Aortic valve regurgitation is not visualized. No aortic stenosis is present. Aortic valve mean gradient measures 4.0 mmHg. Aortic valve peak gradient measures 6.8 mmHg. Pulmonic Valve: The pulmonic valve was not well visualized. Pulmonic valve regurgitation is not visualized. No evidence of pulmonic stenosis. Aorta: The aortic root is normal in size and structure. Venous: The inferior vena cava is normal in size with greater than 50% respiratory variability, suggesting right atrial pressure of 3 mmHg. IAS/Shunts: No atrial level shunt detected by color flow Doppler.   LV Volumes (MOD) LV vol d, MOD A4C: 99.4 ml Diastology LV vol s, MOD A4C: 58.3 ml LV e' lateral:   10.30 cm/s LV SV MOD A4C:     99.4 ml LV E/e' lateral: 5.8  LEFT ATRIUM           Index LA Vol (A4C): 41.5 ml 20.86 ml/m  AORTIC VALVE AV Vmax:           130.00 cm/s AV Vmean:          93.400 cm/s AV VTI:            0.243 m AV Peak Grad:      6.8 mmHg AV Mean Grad:      4.0 mmHg LVOT Vmax:         92.70 cm/s LVOT Vmean:  60.500 cm/s LVOT VTI:          0.151 m LVOT/AV VTI ratio: 0.62 MITRAL VALVE MV Area (PHT): 6.96 cm    SHUNTS MV Peak grad:  4.5 mmHg    Systemic VTI: 0.15 m MV Mean grad:  2.0 mmHg MV Vmax:       1.06 m/s MV Vmean:      67.9 cm/s MV Decel Time: 109 msec MV E velocity: 59.60 cm/s MV A velocity: 68.60 cm/s MV E/A ratio:  0.87 Kathlyn Sacramento MD Electronically signed by Kathlyn Sacramento MD Signature Date/Time: 10/10/2020/2:00:06 PM    Final         Scheduled Meds: . calcium carbonate  1,250 mg Oral Q breakfast  . Chlorhexidine Gluconate Cloth  6 each Topical Daily  . enoxaparin (LOVENOX) injection  40 mg Subcutaneous Q24H  . feeding supplement  237 mL Oral TID BM  . ferrous sulfate  325 mg Oral BID WC  . magnesium chloride  1 tablet Oral Daily  . melatonin  5 mg Oral QHS  . metoprolol tartrate  50 mg Oral BID  . senna-docusate  2 tablet Oral BID  . sodium  chloride flush  10-40 mL Intracatheter Q12H   Continuous Infusions: . ceFEPime (MAXIPIME) IV 2 g (10/11/20 0530)     LOS: 4 days    Time spent: 36 minutes    Sharen Hones, MD [facility]   To contact the attending provider between 7A-7P or the covering provider during after hours 7P-7A, please log into the web site [email or web address] and access using universal [place] password for that web site. If you do not have the password, please call the hospital operator.  10/11/2020, 9:23 AM

## 2020-10-11 NOTE — Consult Note (Signed)
Hematology/Oncology Consult note [facility] [phone] [phone]  Patient Care Team: [place] as PCP - General Rico Junker, RN as Registered Nurse Benjamine Sprague, DO as Consulting Physician (Surgery)   Name of the patient: Jessica Willis  [id number]  [birth year]   Date of visit: 10/11/20 REASON FOR COSULTATION:  Metastatic breast cancer, acute respiratory failure History of presenting illness-  55 y.o. female with PMH listed at below who is known to oncology service due to presents to ER due to sob and hypoxia in clinic. She has metastatic triple negative breast cancer, currently on first line chemotherapy with weekly Taxol.  Patient was Covid positive in January 2022.  Patient denies being symptomatic at that point.  She is not on oxygen at baseline. She presented to clinic last week complaining worsening of shortness of breath, was found to be hypoxic with pulse ox in 87 on room air. She has had chronic tachycardia and prolonged QTc which normalized after repeating electrolytes. chemo was interrupted due to potential of Taxol in prolonging Qtc.  Last chemotherapy was 09/15/20.  Chronic tachycardia, was seen by cardiology recently and was recommended to start on beta-blocker which she did not tolerate very well.  She has non productive cough. No fever, chills.  10/07/20 in  ED, pt was found to have WBC 22.3, negative Covid PCR, lactic acid 2.3, INR 1.3, PTT 36, , blood pressure 138/80, heart rate 134, RR 20  CT chest PE protocol showed no PE, airspace consolidation noted in the right middle and lower lobe.  More ill-defined infiltrate is noted in the left upper lobe anteriorly.  Loculated fluid noted in the left major fissure.  Nodularity in this area could indicate superimposed masses in the left major fissure.  Pleural thickening along both posterior upper hemithorax regions, likely pleural metastasis.  Loculated fluid  noted area of infiltrates on the right.  Small left pleural effusion.  Adenopathy at multiple sites, retrocrural regions bilaterally,  supraclavicular region, axillary region which appears to have progressed compared to last CT scan.  Large right breast mass .  Multiple sclerotic bone metastasis. # patient was admitted for HCAP, was treated with vancomycin and cefepime.  Vancomycin was discontinued after MRSA culture negative. Since admission, patient reports feeling slightly better.  However her respiratory status has not significantly improved.  She remains hypoxic, high oxygen requirement, shortness of breath and tachycardia with minimal exertion.  10/10/2020, echocardiogram showed LVEF 50 to 55%.  Left ventricle has low normal function.    Review of Systems  Constitutional: Positive for appetite change and fatigue. Negative for chills and fever.  HENT:   Negative for hearing loss and voice change.   Eyes: Negative for eye problems.  Respiratory: Positive for cough and shortness of breath. Negative for chest tightness.   Cardiovascular: Negative for chest pain.  Gastrointestinal: Negative for abdominal distention, abdominal pain and blood in stool.  Endocrine: Negative for hot flashes.  Genitourinary: Negative for difficulty urinating and frequency.   Musculoskeletal: Negative for arthralgias.  Skin: Negative for itching and rash.  Neurological: Negative for extremity weakness.  Hematological: Negative for adenopathy.  Psychiatric/Behavioral: Negative for confusion.    No Known Allergies  Patient Active Problem List   Diagnosis Date Noted  . HCAP (healthcare-associated pneumonia) 10/07/2020  . QT prolongation 09/22/2020  . Pneumonia due to Streptococcus pyogenes ([place]) 08/22/2020  . Genetic testing 07/27/2020  . Pancreatic lesion 07/22/2020  . Swelling of lower leg 07/13/2020  .  Encounter for antineoplastic chemotherapy 07/04/2020  . Complication of chemotherapy 07/04/2020  .  Family history of breast cancer   . Port-A-Cath in place   . Bone metastasis ([place])   . Hypocalcemia   . Folate deficiency   . Gastroesophageal reflux disease without esophagitis   . Weakness of right arm   . Palliative care encounter   . Iron deficiency anemia   . Anemia, chronic disease   . Tachycardia   . Metastatic breast cancer ([place])   . Malignant pleural effusion   . Chest tube in place   . Goals of care, counseling/discussion   . Hemothorax on right 06/16/2020  . Acute respiratory failure with hypoxia ([place])   . Breast mass, right 06/15/2020  . Mastitis 06/15/2020  . Pleural effusion 06/15/2020  . Intestinal obstruction ([place])   . Leukocytosis   . Hypokalemia 06/24/2015  . Severe sepsis ([place]) 06/23/2015  . CAP (community acquired pneumonia) 06/23/2015  . Partial small bowel obstruction ([place]) 06/23/2015     Past Medical History:  Diagnosis Date  . Anemia   . Family history of breast cancer   . Patient denies medical problems      Past Surgical History:  Procedure Laterality Date  . BREAST BIOPSY  06/28/2020   Procedure: BREAST BIOPSY;  Surgeon: Benjamine Sprague, DO;  Location: [facility] ORS;  Service: General;;  . PORTACATH PLACEMENT N/A 06/28/2020   Procedure: INSERTION PORT-A-CATH;  Surgeon: Benjamine Sprague, DO;  Location: [facility] ORS;  Service: General;  Laterality: N/A;  . TUBAL LIGATION    . VIDEO ASSISTED THORACOSCOPY (VATS)/THOROCOTOMY Right 06/23/2020   Procedure: ATTEMPTED VIDEO ASSISTED THORACOSCOPY (VATS);  Surgeon: Nestor Lewandowsky, MD;  Location: [facility] ORS;  Service: General;  Laterality: Right;    Social History   Socioeconomic History  . Marital status: Single    Spouse name: Not on file  . Number of children: Not on file  . Years of education: Not on file  . Highest education level: Not on file  Occupational History  . Not on file  Tobacco Use  . Smoking status: Never Smoker  . Smokeless tobacco: Never Used  Vaping Use  . Vaping Use: Never used   Substance and Sexual Activity  . Alcohol use: Not Currently    Alcohol/week: 1.0 standard drink    Types: 1 Cans of beer per week  . Drug use: No  . Sexual activity: Not on file  Other Topics Concern  . Not on file  Social History Narrative  . Not on file   Social Determinants of Health   Financial Resource Strain: Not on file  Food Insecurity: Not on file  Transportation Needs: Not on file  Physical Activity: Not on file  Stress: Not on file  Social Connections: Not on file  Intimate Partner Violence: Not on file     Family History  Problem Relation Age of Onset  . Diabetes Other   . Hypertension Other   . Diabetes Maternal Aunt   . Breast cancer Cousin        dx 41s  . Breast cancer Cousin        dx 92s     Current Facility-Administered Medications:  .  acetaminophen (TYLENOL) tablet 650 mg, 650 mg, Oral, Q6H PRN, Ivor Costa, MD, 650 mg at 10/09/20 2144 .  albuterol (VENTOLIN HFA) 108 (90 Base) MCG/ACT inhaler 2 puff, 2 puff, Inhalation, Q4H PRN, Ivor Costa, MD .  calcium carbonate (OS-CAL - dosed in mg of elemental calcium) tablet  1,250 mg, 1,250 mg, Oral, Q breakfast, Ivor Costa, MD, 1,250 mg at 10/11/20 0850 .  ceFEPIme (MAXIPIME) 2 g in sodium chloride 0.9 % 100 mL IVPB, 2 g, Intravenous, Q8H, Ivor Costa, MD, Last Rate: 200 mL/hr at 10/11/20 0530, 2 g at 10/11/20 0530 .  Chlorhexidine Gluconate Cloth 2 % PADS 6 each, 6 each, Topical, Daily, Sharen Hones, MD, 6 each at 10/11/20 [phone] .  dextromethorphan-guaiFENesin (MUCINEX DM) 30-600 MG per 12 hr tablet 1 tablet, 1 tablet, Oral, BID PRN, Ivor Costa, MD .  enoxaparin (LOVENOX) injection 40 mg, 40 mg, Subcutaneous, Q24H, Ivor Costa, MD, 40 mg at 10/10/20 2102 .  feeding supplement (ENSURE ENLIVE / ENSURE PLUS) liquid 237 mL, 237 mL, Oral, TID BM, Sharen Hones, MD, 237 mL at 10/10/20 2103 .  ferrous sulfate tablet 325 mg, 325 mg, Oral, BID WC, Ivor Costa, MD, 325 mg at 10/11/20 0850 .  lidocaine-prilocaine (EMLA) cream  1 application, 1 application, Topical, PRN, Ivor Costa, MD .  magnesium chloride (SLOW-MAG) 64 MG SR tablet 64 mg, 1 tablet, Oral, Daily, Ivor Costa, MD, 64 mg at 10/11/20 0850 .  melatonin tablet 5 mg, 5 mg, Oral, QHS, Sharen Hones, MD, 5 mg at 10/10/20 2102 .  metoprolol tartrate (LOPRESSOR) tablet 50 mg, 50 mg, Oral, BID, Ivor Costa, MD, 50 mg at 10/11/20 0850 .  ondansetron (ZOFRAN) injection 4 mg, 4 mg, Intravenous, Q8H PRN, Ivor Costa, MD .  senna-docusate (Senokot-S) tablet 2 tablet, 2 tablet, Oral, BID, Sharen Hones, MD, 2 tablet at 10/11/20 0850 .  sodium chloride flush (NS) 0.9 % injection 10-40 mL, 10-40 mL, Intracatheter, Q12H, Sharen Hones, MD, 10 mL at 10/11/20 0855 .  sodium chloride flush (NS) 0.9 % injection 10-40 mL, 10-40 mL, Intracatheter, PRN, Sharen Hones, MD, 10 mL at 10/10/20 1447   Physical exam:  Vitals:   10/11/20 0532 10/11/20 0745 10/11/20 0815 10/11/20 1223  BP: 129/75 136/76  123/70  Pulse: (!) 121 (!) 126  (!) 110  Resp: 20 18  15   Temp: 98.4 F (36.9 C) 98.1 F (36.7 C)  98 F (36.7 C)  TempSrc: Oral Oral  Oral  SpO2: 94% (!) 86% 91% 93%  Weight:      Height:       Physical Exam Constitutional:      General: She is not in acute distress.    Appearance: She is not diaphoretic.  HENT:     Head: Normocephalic and atraumatic.     Nose: Nose normal.     Mouth/Throat:     Mouth: Oropharynx is clear and moist.     Pharynx: No oropharyngeal exudate.  Eyes:     General: No scleral icterus.    Extraocular Movements: EOM normal.     Pupils: Pupils are equal, round, and reactive to light.  Cardiovascular:     Rate and Rhythm: Regular rhythm. Tachycardia present.     Heart sounds: No murmur heard.   Pulmonary:     Effort: No respiratory distress.     Breath sounds: No rales.     Comments: Diminished breath sounds right lower lobe.  No wheezing Abdominal:     General: There is no distension.     Palpations: Abdomen is soft.     Tenderness: There  is no abdominal tenderness.  Musculoskeletal:        General: No edema. Normal range of motion.     Cervical back: Normal range of motion and neck supple.  Skin:  General: Skin is warm and dry.     Findings: No erythema.  Neurological:     Mental Status: She is alert and oriented to person, place, and time.     Cranial Nerves: No cranial nerve deficit.     Motor: No abnormal muscle tone.     Coordination: Coordination normal.  Psychiatric:        Mood and Affect: Affect normal.   Deferred breast examination today.     CMP Latest Ref Rng & Units 10/11/2020  Glucose 70 - 99 mg/dL 91  BUN 6 - 20 mg/dL 18  Creatinine 0.44 - 1.00 mg/dL 0.35(L)  Sodium 135 - 145 mmol/L 143  Potassium 3.5 - 5.1 mmol/L 4.0  Chloride 98 - 111 mmol/L 105  CO2 22 - 32 mmol/L 29  Calcium 8.9 - 10.3 mg/dL 8.5(L)  Total Protein 6.5 - 8.1 g/dL -  Total Bilirubin 0.3 - 1.2 mg/dL -  Alkaline Phos 38 - 126 U/L -  AST 15 - 41 U/L -  ALT 0 - 44 U/L -   CBC Latest Ref Rng & Units 10/11/2020  WBC 4.0 - 10.5 K/uL 21.7(H)  Hemoglobin 12.0 - 15.0 g/dL 10.4(L)  Hematocrit 36.0 - 46.0 % 33.5(L)  Platelets 150 - 400 K/uL 349    RADIOGRAPHIC STUDIES: I have personally reviewed the radiological images as listed and agreed with the findings in the report. DG Chest 2 View  Result Date: 10/07/2020 CLINICAL DATA:  Hypoxia, shortness of breath, metastatic breast cancer EXAM: CHEST - 2 VIEW COMPARISON:  09/13/2020 FINDINGS: LEFT subclavian Port-A-Cath with tip projecting over RIGHT atrium. Enlargement of cardiac silhouette. Pulmonary vascular congestion. Persistent enlargement of RIGHT hilum question mass or adenopathy. Increased infiltrate of the mid to lower RIGHT lung. Small RIGHT pleural effusion again seen. Accentuation of markings in LEFT perihilar region little changed. No pneumothorax or acute osseous findings. IMPRESSION: Increased RIGHT perihilar infiltrate with persistent small RIGHT pleural effusion. Enlargement  of RIGHT pulmonary hilum by adenopathy versus perihilar mass. Electronically Signed   By: Lavonia Dana M.D.   On: 10/07/2020 11:32   DG Chest 2 View  Result Date: 09/13/2020 CLINICAL DATA:  Metastatic breast cancer.  COVID-19. EXAM: CHEST - 2 VIEW COMPARISON:  June 28, 2020. FINDINGS: Stable cardiomediastinal silhouette. Left subclavian Port-A-Cath is unchanged in position. No pneumothorax is noted. Large right perihilar mass is noted concerning for metastatic disease. Mild right pleural effusion is noted. Right upper and lower lobe airspace opacities are noted concerning for pneumonia. Stable left basilar atelectasis or infiltrate is noted. Bony thorax is unremarkable. IMPRESSION: Large right perihilar mass is noted concerning for metastatic disease. Right upper and lower lobe airspace opacities are noted concerning for pneumonia. Mild right pleural effusion is noted. Electronically Signed   By: Marijo Conception M.D.   On: 09/13/2020 16:45   CT Angio Chest PE W and/or Wo Contrast  Result Date: 10/07/2020 CLINICAL DATA:  Breast carcinoma. Tachycardia and shortness of breath EXAM: CT ANGIOGRAPHY CHEST WITH CONTRAST TECHNIQUE: Multidetector CT imaging of the chest was performed using the standard protocol during bolus administration of intravenous contrast. Multiplanar CT image reconstructions and MIPs were obtained to evaluate the vascular anatomy. CONTRAST:  74mL OMNIPAQUE IOHEXOL 350 MG/ML SOLN COMPARISON:  CT angiogram chest June 15, 2020; chest radiograph October 07, 2020 FINDINGS: Cardiovascular: There is no evident pulmonary embolus. No appreciable thoracic aortic aneurysm or dissection. Visualized great vessels appear unremarkable. There is no appreciable pericardial effusion or pericardial thickening. Port-A-Cath tip  is in the superior vena cava near the cavoatrial junction. Mediastinum/Nodes: Thyroid appears normal. There are multiple axillary lymph nodes bilaterally, more severe on the right  than on the left. The largest lymph node is seen in the right axillary region measuring 2.7 x 2.1 cm. There is adenopathy in the right supraclavicular and infraclavicular regions with extension of adenopathy into the right Peri carinal region. Largest individual lymph node in these areas measures 2.6 x 1.8 cm. Several left supraclavicular lymph nodes are also noted, largest measuring 1.7 x 1.3 cm. There are several subcentimeter mediastinal lymph nodes. There is extensive retrocrural adenopathy. The largest lymph node or collection of matted lymph nodes is seen inferior to the aorta on the left posteriorly measuring 3.4 x 2.1 cm. No esophageal lesions are evident. Lungs/Pleura: There is extensive airspace consolidation in portions of the right middle and lower lobes with interspersed areas of loculated pleural effusion. There is ill-defined airspace opacity consistent with pneumonia in the left upper lobe. Loculated fluid tracks along the left major fissure. There may well be associated metastasis within the fissure given the extensive nodularity in this area. There is a small left pleural effusion with left base atelectasis. Pleural metastases noted along each upper hemithorax, primarily posteriorly as well as in the right apex. Upper Abdomen: Retrocrural adenopathy extends into the upper abdominal region. Visualized upper abdominal structures otherwise appear unremarkable. Musculoskeletal: There is a large mass occupying the right breast and chest wall region with invasion of the deep muscles of the chest wall on the right. There is also invasion of the right axilla. There is extensive subcutaneous thickening in both breast regions. There is a mass in the lateral left breast with extension of soft tissue prominence along the lateral left hemithorax, likely due to extension of neoplasm into the adjacent soft tissues. There is widespread sclerotic bony metastatic disease throughout the thoracic region. Review of the  MIP images confirms the above findings. IMPRESSION: 1. No demonstrable pulmonary embolus. No thoracic aortic aneurysm or dissection. 2. Areas of airspace consolidation noted in the right middle and lower lobes. More ill-defined infiltrate is noted in the left upper lobe anteriorly. Areas of loculated fluid noted in the left major fissure. Nodularity in this area could indicate superimposed masses in left major fissure. There is pleural thickening along both posterior upper hemithorax regions, likely pleural metastases. Loculated fluid noted in areas of infiltrate on the right. Small left pleural effusion noted. 3. Adenopathy at multiple sites, most severe in the retrocrural regions bilaterally, right supraclavicular region, and right axillary region, although there is left axillary and left supraclavicular adenopathy. 4. Large mass lesions occupying the right breast with extension and invasion of the chest wall and right axillary regions. A lesser degree of metastatic disease to the left of midline in the subcutaneous tissues and lateral left breast/lateral left chest wall soft tissues noted. 5. Multifocal sclerotic bony metastatic disease throughout the thoracic region noted. Electronically Signed   By: Lowella Grip III M.D.   On: 10/07/2020 13:23   US Venous Img Upper Uni Right(DVT)  Result Date: 10/08/2020 CLINICAL DATA:  History of stage IV breast cancer now with right upper extremity pain and edema. Evaluate for DVT. EXAM: RIGHT UPPER EXTREMITY VENOUS DOPPLER ULTRASOUND TECHNIQUE: Gray-scale sonography with graded compression, as well as color Doppler and duplex ultrasound were performed to evaluate the upper extremity deep venous system from the level of the subclavian vein and including the jugular, axillary, basilic, radial, ulnar and upper cephalic  vein. Spectral Doppler was utilized to evaluate flow at rest and with distal augmentation maneuvers. COMPARISON:  None. FINDINGS: Contralateral  Subclavian Vein: Respiratory phasicity is normal and symmetric with the symptomatic side. No evidence of thrombus. Normal compressibility. Internal Jugular Vein: No evidence of thrombus. Normal compressibility, respiratory phasicity and response to augmentation. Subclavian Vein: No evidence of thrombus. Normal compressibility, respiratory phasicity and response to augmentation. Axillary Vein: Appears patent where imaged though evaluation degraded secondary to patient's discomfort with sonographic evaluation of the axilla. Cephalic Vein: No evidence of thrombus. Normal compressibility, respiratory phasicity and response to augmentation. Basilic Vein: No evidence of thrombus. Normal compressibility, respiratory phasicity and response to augmentation. Brachial Veins: No evidence of thrombus within either of the paired brachial veins. Normal compressibility, respiratory phasicity and response to augmentation. Radial Veins: No evidence of thrombus. Normal compressibility, respiratory phasicity and response to augmentation. Ulnar Veins: No evidence of thrombus. Normal compressibility, respiratory phasicity and response to augmentation. Venous Reflux:  None visualized. Other Findings: There is a moderate amount of subcutaneous edema at the level of the right upper arm. IMPRESSION: No evidence of DVT within the right upper extremity. Electronically Signed   By: Sandi Mariscal M.D.   On: 10/08/2020 14:29   ECHOCARDIOGRAM COMPLETE  Result Date: 10/10/2020    ECHOCARDIOGRAM REPORT   Patient Name:   STORY CONTI Date of Exam: 10/10/2020 Medical Rec #:  [id number]           Height:       70.0 in Accession #:    [id number]          Weight:       178.7 lb Date of Birth:  Jan 20, 1966           BSA:          1.990 m Patient Age:    89 years            BP:           119/79 mmHg Patient Gender: F                   HR:           105 bpm. Exam Location:  [facility] Procedure: 2D Echo, Limited Color Doppler and Cardiac Doppler  Indications:     R06.03 Acute Respiratory Distress  History:         Patient has no prior history of Echocardiogram examinations.                  Signs/Symptoms:Shortness of Breath. Breast cancer.  Sonographer:     Charmayne Sheer RDCS (AE) Referring Phys:  [id number] Sharen Hones Diagnosing Phys: Kathlyn Sacramento MD  Sonographer Comments: Technically challenging study due to limited acoustic windows. IMPRESSIONS  1. Left ventricular ejection fraction, by estimation, is 50 to 55%. The left ventricle has low normal function. Left ventricular endocardial border not optimally defined to evaluate regional wall motion. Left ventricular diastolic parameters are indeterminate.  2. Right ventricular systolic function is normal. The right ventricular size is normal. Tricuspid regurgitation signal is inadequate for assessing PA pressure.  3. The mitral valve is normal in structure. No evidence of mitral valve regurgitation. No evidence of mitral stenosis.  4. The aortic valve is normal in structure. Aortic valve regurgitation is not visualized. No aortic stenosis is present.  5. The inferior vena cava is normal in size with greater than 50% respiratory variability, suggesting right atrial pressure of 3 mmHg. FINDINGS  Left Ventricle: Left  ventricular ejection fraction, by estimation, is 50 to 55%. The left ventricle has low normal function. Left ventricular endocardial border not optimally defined to evaluate regional wall motion. The left ventricular internal cavity  size was normal in size. There is no left ventricular hypertrophy. Left ventricular diastolic parameters are indeterminate. Right Ventricle: The right ventricular size is normal. No increase in right ventricular wall thickness. Right ventricular systolic function is normal. Tricuspid regurgitation signal is inadequate for assessing PA pressure. Left Atrium: Left atrial size was normal in size. Right Atrium: Right atrial size was normal in size. Pericardium: There is no  evidence of pericardial effusion. Mitral Valve: The mitral valve is normal in structure. No evidence of mitral valve regurgitation. No evidence of mitral valve stenosis. MV peak gradient, 4.5 mmHg. The mean mitral valve gradient is 2.0 mmHg. Tricuspid Valve: The tricuspid valve is normal in structure. Tricuspid valve regurgitation is not demonstrated. No evidence of tricuspid stenosis. Aortic Valve: The aortic valve is normal in structure. Aortic valve regurgitation is not visualized. No aortic stenosis is present. Aortic valve mean gradient measures 4.0 mmHg. Aortic valve peak gradient measures 6.8 mmHg. Pulmonic Valve: The pulmonic valve was not well visualized. Pulmonic valve regurgitation is not visualized. No evidence of pulmonic stenosis. Aorta: The aortic root is normal in size and structure. Venous: The inferior vena cava is normal in size with greater than 50% respiratory variability, suggesting right atrial pressure of 3 mmHg. IAS/Shunts: No atrial level shunt detected by color flow Doppler.   LV Volumes (MOD) LV vol d, MOD A4C: 99.4 ml Diastology LV vol s, MOD A4C: 58.3 ml LV e' lateral:   10.30 cm/s LV SV MOD A4C:     99.4 ml LV E/e' lateral: 5.8  LEFT ATRIUM           Index LA Vol (A4C): 41.5 ml 20.86 ml/m  AORTIC VALVE AV Vmax:           130.00 cm/s AV Vmean:          93.400 cm/s AV VTI:            0.243 m AV Peak Grad:      6.8 mmHg AV Mean Grad:      4.0 mmHg LVOT Vmax:         92.70 cm/s LVOT Vmean:        60.500 cm/s LVOT VTI:          0.151 m LVOT/AV VTI ratio: 0.62 MITRAL VALVE MV Area (PHT): 6.96 cm    SHUNTS MV Peak grad:  4.5 mmHg    Systemic VTI: 0.15 m MV Mean grad:  2.0 mmHg MV Vmax:       1.06 m/s MV Vmean:      67.9 cm/s MV Decel Time: 109 msec MV E velocity: 59.60 cm/s MV A velocity: 68.60 cm/s MV E/A ratio:  0.87 Kathlyn Sacramento MD Electronically signed by Kathlyn Sacramento MD Signature Date/Time: 10/10/2020/2:00:06 PM    Final     Assessment and plan-  #Acute respiratory failure with  hypoxia HCAP vs lymphangitic cancer progression in the lung from metastatic triple negative breast cancer. Continue cefepime for now.  10/07/2020 blood culture negative. Patient has elevated lactic acid initially.  Leukocytosis since 09/30/2020, elevated procalcitonin, which has been shown to be of some value for the diagnosis of bacterial infection in the context of cancer.Marland Kitchen  Appreciate ID input.  #Metastatic triple negative breast cancer, patient has high tumor burden, progressed on first-line palliative chemotherapy  Taxol.  There are other treatment options available, i.e. combination of chemotherapy, Sacituzumab govitecan.  Immunotherapy is less likely beneficial given her CPS <1. Once she is cleared from infectious disease aspect, would like to start patient with outpatient second line palliative chemotherapy.  Considering  Sacituzumab govitecan.  #Right breast mass with drainage.  This has been a chronic issue for her.  Continue wound care.  She declined breast examination by me today. #Goals of care was discussed with patient.  She understands that her disease not curable, primary refractory to first-line chemotherapy, aggressive in nature.  She has been seen by palliative service in the cancer center previously and will follow up outpatient   Thank you for allowing me to participate in the care of this patient.    Earlie Server, MD, PhD Hematology Oncology [facility] at [facility] Pager- [phone] 10/11/2020

## 2020-10-11 NOTE — Progress Notes (Signed)
Mobility Specialist - Progress Note   10/11/20 1200  Mobility  Activity Stood at bedside  Range of Motion/Exercises Right leg;Left leg (Standing march)  Level of Assistance Standby assist, set-up cues, supervision of patient - no hands on  Assistive Device Front wheel walker  Distance Ambulated (ft) 0 ft  Mobility Response Tolerated well  Mobility performed by Mobility specialist  $Mobility charge 1 Mobility    Pre-mobility: 110 HR, 92% SpO2 During mobility: 116 HR, 87% SpO2 Post-mobility: 115 HR, 92% SpO2   Pt was lying in bed upon arrival with family present at bedside. Pt agreed to session. Pt utilizing 6L [place] O2. Pt was educated on PLB prior to activity, which would be utilized throughout session. Pt denied pain, nausea, and fatigue. Pt completes all transfers with independence, but is limited by fatigue. Pt transferred EOB with no c/o dizziness or lightheadedness. Pt c/o mild SOB. O2 still in low 90s at this time. Pt stood to RW and progressed to marching in place for 10 seconds with no change in O2 sats. Pt sat back EOB and O2 desat to 87%. PLB in use and an extensive rest break taken for pt recovery. O2 sats rebounded to low 90s after 1 minute, however, pt still very fatigued from activity and after an additional 2-3 minutes of seated rest pt requests to return supine. MD entered at the end of session. Pt was left in bed with all needs in reach and alarm set.    Kathee Delton Mobility Specialist 10/11/20, 12:19 PM

## 2020-10-11 NOTE — Consult Note (Signed)
NAME: Jessica Willis  DOB: [birth year]  MRN: [id number]  Date/Time: 10/11/2020 2:25 PM  REQUESTING PROVIDER: Dr.Zhang Subjective:  REASON FOR CONSULT: leucocytosis/pneumonia ? Laraine Samet is a 55 y.o.female  with stage IV metastatic breast cancer Rt is admitted with sob, tachycardia Pt initally presented to the ED on 06/15/20 with cough and sob. She had massive rt pleural effusion with collapse of rt lung . She also had rt breast mass with changes in skin color for  6 months . She underwent thoracenteiss and it was metastatic adenocarcinoma. MRI of the brain showed scattered mets upper cervical spine She had a port placement and also underwent incisional biopsy of the rt breast mass on 06/28/20 She got a dose of taxol while inpatient and was discharged on 07/01/20. She followed up with oncologist as OP, on palliative chemotherapy with weekly taxol Has been having tachycardia and SOB- saw Cardiology on 09/26/20 Prolonged QT and tachycardia. Was started on Toprol She was sent to the ED from oncology clinic because of SOB/tachycardia on 10/07/20 Vitals 117/70, Temp 98.4, HR 120 and sats 96% Labs WBC 22.3, HB 11, PLT 414 CTA showed extensive airspace consolidation in portions of the right middle and lower lobes with interspersed areas of loculated pleural effusion. There is ill-defined airspace opacity consistent with pneumonia in the left upper lobe. Loculated fluid tracks along the left major fissure. There may well be associated metastasis within the fissure given the extensive nodularity in this area. There is a small left pleural effusion with left base atelectasis. Pleural metastases noted along each upper hemithorax,primarily posteriorly as well as in the right apex. Pt was started on cefepime and even after 5 days of it no response in wbc, so I am asked to see the patient   Past Medical History:  Diagnosis Date  . Anemia   . Family history of breast cancer   . Patient denies  medical problems     Past Surgical History:  Procedure Laterality Date  . BREAST BIOPSY  06/28/2020   Procedure: BREAST BIOPSY;  Surgeon: Benjamine Sprague, DO;  Location: [facility] ORS;  Service: General;;  . PORTACATH PLACEMENT N/A 06/28/2020   Procedure: INSERTION PORT-A-CATH;  Surgeon: Benjamine Sprague, DO;  Location: [facility] ORS;  Service: General;  Laterality: N/A;  . TUBAL LIGATION    . VIDEO ASSISTED THORACOSCOPY (VATS)/THOROCOTOMY Right 06/23/2020   Procedure: ATTEMPTED VIDEO ASSISTED THORACOSCOPY (VATS);  Surgeon: Nestor Lewandowsky, MD;  Location: [facility] ORS;  Service: General;  Laterality: Right;    Social History   Socioeconomic History  . Marital status: Single    Spouse name: Not on file  . Number of children: Not on file  . Years of education: Not on file  . Highest education level: Not on file  Occupational History  . Not on file  Tobacco Use  . Smoking status: Never Smoker  . Smokeless tobacco: Never Used  Vaping Use  . Vaping Use: Never used  Substance and Sexual Activity  . Alcohol use: Not Currently    Alcohol/week: 1.0 standard drink    Types: 1 Cans of beer per week  . Drug use: No  . Sexual activity: Not on file  Other Topics Concern  . Not on file  Social History Narrative  . Not on file   Social Determinants of Health   Financial Resource Strain: Not on file  Food Insecurity: Not on file  Transportation Needs: Not on file  Physical Activity: Not on file  Stress: Not on file  Social  Connections: Not on file  Intimate Partner Violence: Not on file    Family History  Problem Relation Age of Onset  . Diabetes Other   . Hypertension Other   . Diabetes Maternal Aunt   . Breast cancer Cousin        dx 59s  . Breast cancer Cousin        dx 22s   No Known Allergies I? Current Facility-Administered Medications  Medication Dose Route Frequency Provider Last Rate Last Admin  . acetaminophen (TYLENOL) tablet 650 mg  650 mg Oral Q6H PRN Ivor Costa, MD   650 mg at  10/09/20 2144  . albuterol (VENTOLIN HFA) 108 (90 Base) MCG/ACT inhaler 2 puff  2 puff Inhalation Q4H PRN Ivor Costa, MD      . calcium carbonate (OS-CAL - dosed in mg of elemental calcium) tablet 1,250 mg  1,250 mg Oral Q breakfast Ivor Costa, MD   1,250 mg at 10/11/20 0850  . ceFEPIme (MAXIPIME) 2 g in sodium chloride 0.9 % 100 mL IVPB  2 g Intravenous Q8H Ivor Costa, MD 200 mL/hr at 10/11/20 1335 2 g at 10/11/20 1335  . Chlorhexidine Gluconate Cloth 2 % PADS 6 each  6 each Topical Daily Sharen Hones, MD   6 each at 10/11/20 [phone]  . dextromethorphan-guaiFENesin (MUCINEX DM) 30-600 MG per 12 hr tablet 1 tablet  1 tablet Oral BID PRN Ivor Costa, MD      . enoxaparin (LOVENOX) injection 40 mg  40 mg Subcutaneous Q24H Ivor Costa, MD   40 mg at 10/10/20 2102  . feeding supplement (ENSURE ENLIVE / ENSURE PLUS) liquid 237 mL  237 mL Oral TID BM Sharen Hones, MD   237 mL at 10/10/20 2103  . ferrous sulfate tablet 325 mg  325 mg Oral BID WC Ivor Costa, MD   325 mg at 10/11/20 0850  . lidocaine-prilocaine (EMLA) cream 1 application  1 application Topical PRN Ivor Costa, MD      . magnesium chloride (SLOW-MAG) 64 MG SR tablet 64 mg  1 tablet Oral Daily Ivor Costa, MD   64 mg at 10/11/20 0850  . melatonin tablet 5 mg  5 mg Oral QHS Sharen Hones, MD   5 mg at 10/10/20 2102  . metoprolol tartrate (LOPRESSOR) tablet 50 mg  50 mg Oral BID Ivor Costa, MD   50 mg at 10/11/20 0850  . ondansetron (ZOFRAN) injection 4 mg  4 mg Intravenous Q8H PRN Ivor Costa, MD      . senna-docusate (Senokot-S) tablet 2 tablet  2 tablet Oral BID Sharen Hones, MD   2 tablet at 10/11/20 0850  . sodium chloride flush (NS) 0.9 % injection 10-40 mL  10-40 mL Intracatheter Q12H Sharen Hones, MD   10 mL at 10/11/20 0855  . sodium chloride flush (NS) 0.9 % injection 10-40 mL  10-40 mL Intracatheter PRN Sharen Hones, MD   10 mL at 10/10/20 1447     Abtx:  Anti-infectives (From admission, onward)   Start     Dose/Rate Route Frequency  Ordered Stop   10/07/20 2000  ceFEPIme (MAXIPIME) 2 g in sodium chloride 0.9 % 100 mL IVPB        2 g 200 mL/hr over 30 Minutes Intravenous Every 8 hours 10/07/20 1405     10/07/20 1800  vancomycin (VANCOCIN) IVPB 1000 mg/200 mL premix  Status:  Discontinued        1,000 mg 200 mL/hr over 60 Minutes Intravenous Every 12 hours  10/07/20 1405 10/08/20 1311   10/07/20 1115  ceFEPIme (MAXIPIME) 2 g in sodium chloride 0.9 % 100 mL IVPB        2 g 200 mL/hr over 30 Minutes Intravenous  Once 10/07/20 1109 10/07/20 1233   10/07/20 1115  metroNIDAZOLE (FLAGYL) IVPB 500 mg        500 mg 100 mL/hr over 60 Minutes Intravenous  Once 10/07/20 1109 10/07/20 1448   10/07/20 1115  vancomycin (VANCOCIN) IVPB 1000 mg/200 mL premix        1,000 mg 200 mL/hr over 60 Minutes Intravenous  Once 10/07/20 1109 10/07/20 1448      REVIEW OF SYSTEMS:  Const: negative fever, negative chills, has weight loss Eyes: negative diplopia or visual changes, negative eye pain ENT: negative coryza, negative sore throat Resp: cough, +++ dyspnea Cards: + chest pain, palpitations, lower extremity edema GU: negative for frequency, dysuria and hematuria GI: Negative for abdominal pain, diarrhea, bleeding, constipation Skin: negative for rash and pruritus Heme: negative for easy bruising and gum/nose bleeding MS: generalized weakness Neurolo:negative for headaches, dizziness, vertigo, memory problems  Psych:  anxiety, depression  Endocrine: negative for thyroid, diabetes Allergy/Immunology- negative for any medication or food allergies ?  Objective:  VITALS:  BP 123/70 (BP Location: Left Arm)   Pulse (!) 110   Temp 98 F (36.7 C) (Oral)   Resp 15   Ht 5\' 10"  (1.778 m)   Wt 81.1 kg   LMP 06/16/2015 Comment: Tubal Ligation   SpO2 93%   BMI 25.64 kg/m  PHYSICAL EXAM:  General: Alert, emaciated, weak,   Head: Normocephalic, without obvious abnormality, atraumatic. Eyes: Conjunctivae clear, anicteric sclerae. Pupils  are equal ENT Nares normal. No drainage or sinus tenderness. Lips, mucosa, and tongue normal. No Thrush Neck:  symmetrical,  Rt breast fungating mass /necrotic Worse since Nov 2021 PT gave verbal consent  to take the picture  10/11/20    06/16/20    Lungs: b/l air entry- decreased rt side . Heart: Tachycardia Abdomen: Soft, non-tender,not distended. Bowel sounds normal. No masses Extremities: atraumatic, no cyanosis. No edema. No clubbing Skin: No rashes or lesions. Or bruising Lymph: Cervical, supraclavicular normal. Neurologic: Grossly non-focal Pertinent Labs Lab Results CBC     Component Value Date/Time   WBC 21.7 (H) 10/11/2020 0514   RBC 4.04 10/11/2020 0514   HGB 10.4 (L) 10/11/2020 0514   HCT 33.5 (L) 10/11/2020 0514   PLT 349 10/11/2020 0514   MCV 82.9 10/11/2020 0514   MCH 25.7 (L) 10/11/2020 0514   MCHC 31.0 10/11/2020 0514   RDW 16.6 (H) 10/11/2020 0514   LYMPHSABS 1.5 10/11/2020 0514   MONOABS 1.2 (H) 10/11/2020 0514   EOSABS 0.0 10/11/2020 0514   BASOSABS 0.1 10/11/2020 0514    CMP Latest Ref Rng & Units 10/11/2020 10/09/2020 10/08/2020  Glucose 70 - 99 mg/dL 91 117(H) 99  BUN 6 - 20 mg/dL 18 15 14   Creatinine 0.44 - 1.00 mg/dL 0.35(L) 0.46 0.63  Sodium 135 - 145 mmol/L 143 138 139  Potassium 3.5 - 5.1 mmol/L 4.0 4.3 3.8  Chloride 98 - 111 mmol/L 105 105 106  CO2 22 - 32 mmol/L 29 25 24   Calcium 8.9 - 10.3 mg/dL 8.5(L) 8.5(L) 8.2(L)  Total Protein 6.5 - 8.1 g/dL - - -  Total Bilirubin 0.3 - 1.2 mg/dL - - -  Alkaline Phos 38 - 126 U/L - - -  AST 15 - 41 U/L - - -  ALT 0 -  44 U/L - - -      Microbiology: Recent Results (from the past 240 hour(s))  Blood Culture (routine x 2)     Status: None (Preliminary result)   Collection Time: 10/07/20 11:39 AM   Specimen: BLOOD  Result Value Ref Range Status   Specimen Description BLOOD LEFT WRIST  Final   Special Requests   Final    BOTTLES DRAWN AEROBIC AND ANAEROBIC Blood Culture adequate volume    Culture   Final    NO GROWTH 4 DAYS Performed at [facility], [address], [place], [place] [address]    Report Status PENDING  Incomplete  Resp Panel by RT-PCR (Flu A&B, Covid) Nasopharyngeal Swab     Status: None   Collection Time: 10/07/20 11:56 AM   Specimen: Nasopharyngeal Swab; Nasopharyngeal(NP) swabs in vial transport medium  Result Value Ref Range Status   SARS Coronavirus 2 by RT PCR NEGATIVE NEGATIVE Final    Comment: (NOTE) SARS-CoV-2 target nucleic acids are NOT DETECTED.  The SARS-CoV-2 RNA is generally detectable in upper respiratory specimens during the acute phase of infection. The lowest concentration of SARS-CoV-2 viral copies this assay can detect is 138 copies/mL. A negative result does not preclude SARS-Cov-2 infection and should not be used as the sole basis for treatment or other patient management decisions. A negative result may occur with  improper specimen collection/handling, submission of specimen other than nasopharyngeal swab, presence of viral mutation(s) within the areas targeted by this assay, and inadequate number of viral copies(<138 copies/mL). A negative result must be combined with clinical observations, patient history, and epidemiological information. The expected result is Negative.  Fact Sheet for Patients:  EntrepreneurPulse.com.au  Fact Sheet for Healthcare Providers:  IncredibleEmployment.be  This test is no t yet approved or cleared by the [place] FDA and  has been authorized for detection and/or diagnosis of SARS-CoV-2 by FDA under an Emergency Use Authorization (EUA). This EUA will remain  in effect (meaning this test can be used) for the duration of the COVID-19 declaration under Section 564(b)(1) of the Act, 21 U.S.C.section 360bbb-3(b)(1), unless the authorization is terminated  or revoked sooner.       Influenza A by PCR NEGATIVE NEGATIVE Final   Influenza B by PCR  NEGATIVE NEGATIVE Final    Comment: (NOTE) The Xpert Xpress SARS-CoV-2/FLU/RSV plus assay is intended as an aid in the diagnosis of influenza from Nasopharyngeal swab specimens and should not be used as a sole basis for treatment. Nasal washings and aspirates are unacceptable for Xpert Xpress SARS-CoV-2/FLU/RSV testing.  Fact Sheet for Patients: EntrepreneurPulse.com.au  Fact Sheet for Healthcare Providers: IncredibleEmployment.be  This test is not yet approved or cleared by the [place] FDA and has been authorized for detection and/or diagnosis of SARS-CoV-2 by FDA under an Emergency Use Authorization (EUA). This EUA will remain in effect (meaning this test can be used) for the duration of the COVID-19 declaration under Section 564(b)(1) of the Act, 21 U.S.C. section 360bbb-3(b)(1), unless the authorization is terminated or revoked.  Performed at [facility], [address] [place], [place] [address]   Blood Culture (routine x 2)     Status: None (Preliminary result)   Collection Time: 10/07/20 11:56 AM   Specimen: BLOOD  Result Value Ref Range Status   Specimen Description BLOOD RIGHT HAND  Final   Special Requests   Final    BOTTLES DRAWN AEROBIC AND ANAEROBIC Blood Culture adequate volume   Culture   Final  NO GROWTH 4 DAYS Performed at [facility], [address] [place], [place] [address]    Report Status PENDING  Incomplete  Urine culture     Status: Abnormal   Collection Time: 10/08/20  9:20 AM   Specimen: Urine, Random  Result Value Ref Range Status   Specimen Description   Final    URINE, RANDOM Performed at [facility], [address] [place], [place] [address]    Special Requests   Final    NONE Performed at [facility], [place], [place] [address]    Culture 10,000 COLONIES/mL STAPHYLOCOCCUS LUGDUNENSIS (A)  Final   Report Status 10/10/2020 FINAL   Final   Organism ID, Bacteria STAPHYLOCOCCUS LUGDUNENSIS (A)  Final      Susceptibility   Staphylococcus lugdunensis - MIC*    CIPROFLOXACIN <=0.5 SENSITIVE Sensitive     GENTAMICIN <=0.5 SENSITIVE Sensitive     NITROFURANTOIN <=16 SENSITIVE Sensitive     OXACILLIN 0.5 SENSITIVE Sensitive     TETRACYCLINE <=1 SENSITIVE Sensitive     VANCOMYCIN <=0.5 SENSITIVE Sensitive     TRIMETH/SULFA <=10 SENSITIVE Sensitive     CLINDAMYCIN RESISTANT Resistant     RIFAMPIN <=0.5 SENSITIVE Sensitive     Inducible Clindamycin POSITIVE Resistant     * 10,000 COLONIES/mL STAPHYLOCOCCUS LUGDUNENSIS  MRSA PCR Screening     Status: None   Collection Time: 10/08/20 11:36 AM   Specimen: Nasal Mucosa; Nasopharyngeal  Result Value Ref Range Status   MRSA by PCR NEGATIVE NEGATIVE Final    Comment:        The GeneXpert MRSA Assay (FDA approved for NASAL specimens only), is one component of a comprehensive MRSA colonization surveillance program. It is not intended to diagnose MRSA infection nor to guide or monitor treatment for MRSA infections. Performed at [facility], [address] [place], [place] [address]     IMAGING RESULTS:  extensive airspace consolidation in portions of the right middle and lower lobes with interspersed areas of loculated pleural effusion. There is ill-defined airspace opacity consistent with pneumonia in the left upper lobe. Loculated fluid tracks along the left major fissure. There may well be associated metastasis within the fissure given the extensive nodularity in this area. There is a small left pleural effusion with left base atelectasis. Pleural metastases noted along each upper hemithorax,primarily posteriorly as well as in the right apex.I have personally reviewed the films ? 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 No response to Iv cefepime Leucocytosis is due to necrotic/fungating rt  breast mass and obstructive pneumonia Change cefepime to Unasyn procal is not a reliable test for infection in wide spread mets Bronchsocpy to look for any obstructive mass and apsiration of the loculated effusion to r/o infection may be undertaken if aggressive management has to be pursued. Recommend pulmonary consult Hypoxic resp failure   Stage IV rt breast carcinoma with mets to lungs/cervical spine  Anemia  Discussed  the management with Dr.Zhang and Dr.Yu  Note:  This document was prepared using Dragon voice recognition software and may include unintentional dictation errors.

## 2020-10-11 NOTE — Progress Notes (Signed)
pts o2 sats in the 80s on 4LNC - 02 increased to 6L [place] - o2 sats improved to 92%/  No distress noted/ MD aware/ will monitor.

## 2020-10-12 DIAGNOSIS — Z515 Encounter for palliative care: Secondary | ICD-10-CM

## 2020-10-12 DIAGNOSIS — J189 Pneumonia, unspecified organism: Secondary | ICD-10-CM | POA: Diagnosis not present

## 2020-10-12 DIAGNOSIS — Z7189 Other specified counseling: Secondary | ICD-10-CM | POA: Diagnosis not present

## 2020-10-12 DIAGNOSIS — D509 Iron deficiency anemia, unspecified: Secondary | ICD-10-CM | POA: Diagnosis not present

## 2020-10-12 DIAGNOSIS — J9601 Acute respiratory failure with hypoxia: Secondary | ICD-10-CM | POA: Diagnosis not present

## 2020-10-12 DIAGNOSIS — C50919 Malignant neoplasm of unspecified site of unspecified female breast: Secondary | ICD-10-CM | POA: Diagnosis not present

## 2020-10-12 LAB — CBC WITH DIFFERENTIAL/PLATELET
Abs Immature Granulocytes: 0.97 10*3/uL — ABNORMAL HIGH (ref 0.00–0.07)
Basophils Absolute: 0.1 10*3/uL (ref 0.0–0.1)
Basophils Relative: 0 %
Eosinophils Absolute: 0.1 10*3/uL (ref 0.0–0.5)
Eosinophils Relative: 0 %
HCT: 34.1 % — ABNORMAL LOW (ref 36.0–46.0)
Hemoglobin: 10.5 g/dL — ABNORMAL LOW (ref 12.0–15.0)
Immature Granulocytes: 4 %
Lymphocytes Relative: 8 %
Lymphs Abs: 1.8 10*3/uL (ref 0.7–4.0)
MCH: 25.7 pg — ABNORMAL LOW (ref 26.0–34.0)
MCHC: 30.8 g/dL (ref 30.0–36.0)
MCV: 83.4 fL (ref 80.0–100.0)
Monocytes Absolute: 1.3 10*3/uL — ABNORMAL HIGH (ref 0.1–1.0)
Monocytes Relative: 6 %
Neutro Abs: 18.8 10*3/uL — ABNORMAL HIGH (ref 1.7–7.7)
Neutrophils Relative %: 82 %
Platelets: 347 10*3/uL (ref 150–400)
RBC: 4.09 MIL/uL (ref 3.87–5.11)
RDW: 16.5 % — ABNORMAL HIGH (ref 11.5–15.5)
WBC: 23 10*3/uL — ABNORMAL HIGH (ref 4.0–10.5)
nRBC: 0 % (ref 0.0–0.2)

## 2020-10-12 LAB — CULTURE, BLOOD (ROUTINE X 2)
Culture: NO GROWTH
Culture: NO GROWTH
Special Requests: ADEQUATE
Special Requests: ADEQUATE

## 2020-10-12 LAB — BASIC METABOLIC PANEL
Anion gap: 10 (ref 5–15)
BUN: 23 mg/dL — ABNORMAL HIGH (ref 6–20)
CO2: 29 mmol/L (ref 22–32)
Calcium: 8.5 mg/dL — ABNORMAL LOW (ref 8.9–10.3)
Chloride: 103 mmol/L (ref 98–111)
Creatinine, Ser: 0.49 mg/dL (ref 0.44–1.00)
GFR, Estimated: >60 mL/min (ref >60)
Glucose, Bld: 95 mg/dL (ref 70–99)
Potassium: 4 mmol/L (ref 3.5–5.1)
Sodium: 142 mmol/L (ref 135–145)

## 2020-10-12 LAB — LACTATE DEHYDROGENASE: LDH: 392 U/L — ABNORMAL HIGH (ref 98–192)

## 2020-10-12 LAB — MAGNESIUM: Magnesium: 2.1 mg/dL (ref 1.7–2.4)

## 2020-10-12 LAB — C-REACTIVE PROTEIN: CRP: 16 mg/dL — ABNORMAL HIGH (ref <1.0)

## 2020-10-12 LAB — PROCALCITONIN: Procalcitonin: 56.07 ng/mL

## 2020-10-12 LAB — SEDIMENTATION RATE: Sed Rate: 44 mm/hr — ABNORMAL HIGH (ref 0–30)

## 2020-10-12 MED ORDER — ALBUMIN HUMAN 25 % IV SOLN
12.5000 g | Freq: Once | INTRAVENOUS | Status: AC
  Administered 2020-10-12: 12.5 g via INTRAVENOUS
  Filled 2020-10-12: qty 50

## 2020-10-12 MED ORDER — FUROSEMIDE 10 MG/ML IJ SOLN
40.0000 mg | Freq: Every day | INTRAMUSCULAR | Status: DC
  Administered 2020-10-12 – 2020-10-16 (×5): 40 mg via INTRAVENOUS
  Filled 2020-10-12 (×5): qty 4

## 2020-10-12 MED ORDER — ALBUTEROL SULFATE (2.5 MG/3ML) 0.083% IN NEBU
2.5000 mg | INHALATION_SOLUTION | Freq: Two times a day (BID) | RESPIRATORY_TRACT | Status: DC
  Administered 2020-10-12 – 2020-10-14 (×5): 2.5 mg via RESPIRATORY_TRACT
  Filled 2020-10-12 (×4): qty 3

## 2020-10-12 MED ORDER — METHYLPREDNISOLONE SODIUM SUCC 40 MG IJ SOLR
20.0000 mg | Freq: Two times a day (BID) | INTRAMUSCULAR | Status: DC
  Administered 2020-10-12 – 2020-10-19 (×15): 20 mg via INTRAVENOUS
  Filled 2020-10-12 (×15): qty 1

## 2020-10-12 NOTE — Progress Notes (Signed)
Rt  Bedside with patient going over bronchial hygiene and RT assisted pt with metaneb but patient felt it was over board. Pt can cough with no issues and has no trouble with secretions. RT gave patient an incentive and pt inhaled 550 on first attempt. Pt felt very comfortable with this chest physiotherapy and RT instructed patient how many times a day and pt was very happy with results after a couple of tries. RT will continue to monitor patient and make sure that her treatment that we are doing is helping to clear the pneumonia from lungs.

## 2020-10-12 NOTE — Progress Notes (Signed)
Mobility Specialist - Progress Note   10/12/20 1300  Mobility  Activity Dangled on edge of bed  Level of Assistance Standby assist, set-up cues, supervision of patient - no hands on  Assistive Device None  Distance Ambulated (ft) 0 ft  Mobility Response Tolerated fair  Mobility performed by Mobility specialist  $Mobility charge 1 Mobility    Pre-mobility: 109 HR, 85% SpO2 During mobility: 110 HR, 88% SpO2 Post-mobility: 109 HR, 90% SpO2   Pt was lying in bed upon arrival with HOB elevated. Pt agreed to session. Pt utilizing 6L O2 on arrival and sats reading at 85% with c/o SOB. Mobility monitored O2 for 2 minutes, with PLB in effect, no change. Pt sat EOB with supervision. Pt c/o lightheadedness upon sitting, further activity deferred d/t issue not self-resolving. Pt dangled EOB for 5 minutes. Mobility increased O2 to 7L to get sats to 88%. Pt returned supine with supervision and HOB elevated. Pt's O2 desat to a low of 82% with position change, but does fluctuate at 88-90% after 2 minutes of rest. Overall, pt with increased O2 needs for minimal exertion this session than previous. Pt was left in bed with all needs in reach and alarm set. Nurse notified.    Kathee Delton Mobility Specialist 10/12/20, 1:21 PM

## 2020-10-12 NOTE — Consult Note (Signed)
[place]  [phone] [phone]   Name: Jessica Willis Date: 10/12/2020 MRN: [id number]  DOB: 03/21/1966  Patient Care Team: Bonnie as PCP - Lester Kinsman, Linna Darner, RN as Registered Nurse Benjamine Sprague, DO as Consulting Physician (Surgery)    REASON FOR CONSULTATION: Jessica Willis is a 55 y.o. 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  CT of the chest revealed evidence of disease progression with worse adenopathy.  Additionally, her necrotic breast mass appears to be enlarging on treatment.  Palliative care was consulted help address goals.   SOCIAL HISTORY:     reports that she has never smoked. She has never used smokeless tobacco. She reports previous alcohol use of about 1.0 standard drink of alcohol per week. She reports that she does not use drugs.  Patient is unmarried and lives at home with her boyfriend. She has 2 sons and a daughter who are involved in her care. Patient worked at an [employer].  ADVANCE DIRECTIVES:  Does not have  CODE STATUS: Full code  PAST MEDICAL HISTORY: Past Medical History:  Diagnosis Date  . Anemia   . Family history of breast cancer   . Patient denies medical problems     PAST SURGICAL HISTORY:  Past Surgical History:  Procedure Laterality Date  . BREAST BIOPSY  06/28/2020   Procedure: BREAST BIOPSY;  Surgeon: Benjamine Sprague, DO;  Location: [facility] ORS;  Service: General;;  . PORTACATH PLACEMENT N/A 06/28/2020   Procedure: INSERTION PORT-A-CATH;  Surgeon: Benjamine Sprague, DO;  Location: [facility] ORS;  Service: General;  Laterality: N/A;  . TUBAL LIGATION    . VIDEO ASSISTED THORACOSCOPY (VATS)/THOROCOTOMY Right  06/23/2020   Procedure: ATTEMPTED VIDEO ASSISTED THORACOSCOPY (VATS);  Surgeon: Nestor Lewandowsky, MD;  Location: [facility] ORS;  Service: General;  Laterality: Right;    HEMATOLOGY/ONCOLOGY HISTORY:  Oncology History  Metastatic breast cancer ([place])  06/21/2020 Initial Diagnosis   Metastatic breast cancer ([place])   06/28/2020 Cancer Staging   Staging form: Breast, AJCC 8th Edition - Clinical stage from 06/28/2020: Stage IV (cT4c, cM1, G3, ER-, PR-, HER2-) - Signed by Earlie Server, MD on 08/11/2020   06/29/2020 -  Chemotherapy    Patient is on Treatment Plan: BREAST PACLITAXEL D1,8,15 Q28D       Genetic Testing   Negative genetic testing. No pathogenic variants identified on the [facility] Common Hereditary Cancers Panel. The report date is 07/25/2020.  The Common Hereditary Cancers Panel offered by [facility] includes sequencing and/or deletion duplication testing of the following 48 genes: APC, ATM, AXIN2, BARD1, BMPR1A, BRCA1, BRCA2, BRIP1, CDH1, CDKN2A (p14ARF), CDKN2A (p16INK4a), CKD4, CHEK2, CTNNA1, DICER1, EPCAM (Deletion/duplication testing only), GREM1 (promoter region deletion/duplication testing only), KIT, MEN1, MLH1, MSH2, MSH3, MSH6, MUTYH, NBN, NF1, NHTL1, PALB2, PDGFRA, PMS2, POLD1, POLE, PTEN, RAD50, RAD51C, RAD51D, RNF43, SDHB, SDHC, SDHD, SMAD4, SMARCA4. STK11, TP53, TSC1, TSC2, and VHL.  The following genes were evaluated for sequence changes only: SDHA and HOXB13 c.251G>A variant only.     ALLERGIES:  has No Known Allergies.  MEDICATIONS:  Current Facility-Administered Medications  Medication Dose Route Frequency Provider Last Rate Last Admin  . acetaminophen (TYLENOL) tablet 650 mg  650 mg Oral Q6H PRN Ivor Costa, MD   650 mg at 10/09/20 2144  . albuterol (  VENTOLIN HFA) 108 (90 Base) MCG/ACT inhaler 2 puff  2 puff Inhalation Q4H PRN Ivor Costa, MD      . Ampicillin-Sulbactam (UNASYN) 3 g in sodium chloride 0.9 % 100 mL IVPB  3 g Intravenous Q6H Ravishankar, Joellyn Quails, MD 200 mL/hr at  10/12/20 0920 3 g at 10/12/20 0920  . calcium carbonate (OS-CAL - dosed in mg of elemental calcium) tablet 1,250 mg  1,250 mg Oral Q breakfast Ivor Costa, MD   1,250 mg at 10/12/20 0916  . Chlorhexidine Gluconate Cloth 2 % PADS 6 each  6 each Topical Daily Sharen Hones, MD   6 each at 10/12/20 [phone]  . dextromethorphan-guaiFENesin (MUCINEX DM) 30-600 MG per 12 hr tablet 1 tablet  1 tablet Oral BID PRN Ivor Costa, MD      . enoxaparin (LOVENOX) injection 40 mg  40 mg Subcutaneous Q24H Ivor Costa, MD   40 mg at 10/11/20 2130  . feeding supplement (ENSURE ENLIVE / ENSURE PLUS) liquid 237 mL  237 mL Oral TID BM Sharen Hones, MD   237 mL at 10/12/20 0916  . ferrous sulfate tablet 325 mg  325 mg Oral BID WC Ivor Costa, MD   325 mg at 10/12/20 0916  . lidocaine-prilocaine (EMLA) cream 1 application  1 application Topical PRN Ivor Costa, MD      . magnesium chloride (SLOW-MAG) 64 MG SR tablet 64 mg  1 tablet Oral Daily Ivor Costa, MD   64 mg at 10/12/20 0915  . melatonin tablet 5 mg  5 mg Oral QHS Sharen Hones, MD   5 mg at 10/11/20 2130  . metoprolol tartrate (LOPRESSOR) tablet 50 mg  50 mg Oral BID Ivor Costa, MD   50 mg at 10/12/20 0916  . ondansetron (ZOFRAN) injection 4 mg  4 mg Intravenous Q8H PRN Ivor Costa, MD      . senna-docusate (Senokot-S) tablet 2 tablet  2 tablet Oral BID Sharen Hones, MD   2 tablet at 10/12/20 [phone]  . sodium chloride flush (NS) 0.9 % injection 10-40 mL  10-40 mL Intracatheter Q12H Sharen Hones, MD   10 mL at 10/12/20 0917  . sodium chloride flush (NS) 0.9 % injection 10-40 mL  10-40 mL Intracatheter PRN Sharen Hones, MD   10 mL at 10/10/20 1447    VITAL SIGNS: BP 122/73 (BP Location: Left Arm)   Pulse (!) 125   Temp 98.2 F (36.8 C) (Oral)   Resp 20   Ht 5' 10"  (1.778 m)   Wt 178 lb 11.2 oz (81.1 kg)   LMP 06/16/2015 Comment: Tubal Ligation   SpO2 93%   BMI 25.64 kg/m  Filed Weights   10/08/20 0304 10/09/20 0303 10/10/20 0411  Weight: 174 lb 11.2 oz (79.2 kg) 176  lb 2.4 oz (79.9 kg) 178 lb 11.2 oz (81.1 kg)    Estimated body mass index is 25.64 kg/m as calculated from the following:   Height as of this encounter: 5' 10"  (1.778 m).   Weight as of this encounter: 178 lb 11.2 oz (81.1 kg).  LABS: CBC:    Component Value Date/Time   WBC 23.0 (H) 10/12/2020 0711   HGB 10.5 (L) 10/12/2020 0711   HCT 34.1 (L) 10/12/2020 0711   PLT 347 10/12/2020 0711   MCV 83.4 10/12/2020 0711   NEUTROABS 18.8 (H) 10/12/2020 0711   LYMPHSABS 1.8 10/12/2020 0711   MONOABS 1.3 (H) 10/12/2020 0711   EOSABS 0.1 10/12/2020 0711   BASOSABS 0.1 10/12/2020 3329  Comprehensive Metabolic Panel:    Component Value Date/Time   NA 142 10/12/2020 0711   K 4.0 10/12/2020 0711   CL 103 10/12/2020 0711   CO2 29 10/12/2020 0711   BUN 23 (H) 10/12/2020 0711   CREATININE 0.49 10/12/2020 0711   GLUCOSE 95 10/12/2020 0711   CALCIUM 8.5 (L) 10/12/2020 0711   AST 69 (H) 10/07/2020 1025   ALT 21 10/07/2020 1025   ALKPHOS 141 (H) 10/07/2020 1025   BILITOT 0.5 10/07/2020 1025   PROT 7.0 10/07/2020 1025   ALBUMIN 2.9 (L) 10/07/2020 1025    RADIOGRAPHIC STUDIES: DG Chest 2 View  Result Date: 10/07/2020 CLINICAL DATA:  Hypoxia, shortness of breath, metastatic breast cancer EXAM: CHEST - 2 VIEW COMPARISON:  09/13/2020 FINDINGS: LEFT subclavian Port-A-Cath with tip projecting over RIGHT atrium. Enlargement of cardiac silhouette. Pulmonary vascular congestion. Persistent enlargement of RIGHT hilum question mass or adenopathy. Increased infiltrate of the mid to lower RIGHT lung. Small RIGHT pleural effusion again seen. Accentuation of markings in LEFT perihilar region little changed. No pneumothorax or acute osseous findings. IMPRESSION: Increased RIGHT perihilar infiltrate with persistent small RIGHT pleural effusion. Enlargement of RIGHT pulmonary hilum by adenopathy versus perihilar mass. Electronically Signed   By: Lavonia Dana M.D.   On: 10/07/2020 11:32   DG Chest 2 View  Result  Date: 09/13/2020 CLINICAL DATA:  Metastatic breast cancer.  COVID-19. EXAM: CHEST - 2 VIEW COMPARISON:  June 28, 2020. FINDINGS: Stable cardiomediastinal silhouette. Left subclavian Port-A-Cath is unchanged in position. No pneumothorax is noted. Large right perihilar mass is noted concerning for metastatic disease. Mild right pleural effusion is noted. Right upper and lower lobe airspace opacities are noted concerning for pneumonia. Stable left basilar atelectasis or infiltrate is noted. Bony thorax is unremarkable. IMPRESSION: Large right perihilar mass is noted concerning for metastatic disease. Right upper and lower lobe airspace opacities are noted concerning for pneumonia. Mild right pleural effusion is noted. Electronically Signed   By: Marijo Conception M.D.   On: 09/13/2020 16:45   CT Angio Chest PE W and/or Wo Contrast  Result Date: 10/07/2020 CLINICAL DATA:  Breast carcinoma. Tachycardia and shortness of breath EXAM: CT ANGIOGRAPHY CHEST WITH CONTRAST TECHNIQUE: Multidetector CT imaging of the chest was performed using the standard protocol during bolus administration of intravenous contrast. Multiplanar CT image reconstructions and MIPs were obtained to evaluate the vascular anatomy. CONTRAST:  11m OMNIPAQUE IOHEXOL 350 MG/ML SOLN COMPARISON:  CT angiogram chest June 15, 2020; chest radiograph October 07, 2020 FINDINGS: Cardiovascular: There is no evident pulmonary embolus. No appreciable thoracic aortic aneurysm or dissection. Visualized great vessels appear unremarkable. There is no appreciable pericardial effusion or pericardial thickening. Port-A-Cath tip is in the superior vena cava near the cavoatrial junction. Mediastinum/Nodes: Thyroid appears normal. There are multiple axillary lymph nodes bilaterally, more severe on the right than on the left. The largest lymph node is seen in the right axillary region measuring 2.7 x 2.1 cm. There is adenopathy in the right supraclavicular and  infraclavicular regions with extension of adenopathy into the right Peri carinal region. Largest individual lymph node in these areas measures 2.6 x 1.8 cm. Several left supraclavicular lymph nodes are also noted, largest measuring 1.7 x 1.3 cm. There are several subcentimeter mediastinal lymph nodes. There is extensive retrocrural adenopathy. The largest lymph node or collection of matted lymph nodes is seen inferior to the aorta on the left posteriorly measuring 3.4 x 2.1 cm. No esophageal lesions are evident. Lungs/Pleura: There is  extensive airspace consolidation in portions of the right middle and lower lobes with interspersed areas of loculated pleural effusion. There is ill-defined airspace opacity consistent with pneumonia in the left upper lobe. Loculated fluid tracks along the left major fissure. There may well be associated metastasis within the fissure given the extensive nodularity in this area. There is a small left pleural effusion with left base atelectasis. Pleural metastases noted along each upper hemithorax, primarily posteriorly as well as in the right apex. Upper Abdomen: Retrocrural adenopathy extends into the upper abdominal region. Visualized upper abdominal structures otherwise appear unremarkable. Musculoskeletal: There is a large mass occupying the right breast and chest wall region with invasion of the deep muscles of the chest wall on the right. There is also invasion of the right axilla. There is extensive subcutaneous thickening in both breast regions. There is a mass in the lateral left breast with extension of soft tissue prominence along the lateral left hemithorax, likely due to extension of neoplasm into the adjacent soft tissues. There is widespread sclerotic bony metastatic disease throughout the thoracic region. Review of the MIP images confirms the above findings. IMPRESSION: 1. No demonstrable pulmonary embolus. No thoracic aortic aneurysm or dissection. 2. Areas of airspace  consolidation noted in the right middle and lower lobes. More ill-defined infiltrate is noted in the left upper lobe anteriorly. Areas of loculated fluid noted in the left major fissure. Nodularity in this area could indicate superimposed masses in left major fissure. There is pleural thickening along both posterior upper hemithorax regions, likely pleural metastases. Loculated fluid noted in areas of infiltrate on the right. Small left pleural effusion noted. 3. Adenopathy at multiple sites, most severe in the retrocrural regions bilaterally, right supraclavicular region, and right axillary region, although there is left axillary and left supraclavicular adenopathy. 4. Large mass lesions occupying the right breast with extension and invasion of the chest wall and right axillary regions. A lesser degree of metastatic disease to the left of midline in the subcutaneous tissues and lateral left breast/lateral left chest wall soft tissues noted. 5. Multifocal sclerotic bony metastatic disease throughout the thoracic region noted. Electronically Signed   By: Lowella Grip III M.D.   On: 10/07/2020 13:23   US Venous Img Upper Uni Right(DVT)  Result Date: 10/08/2020 CLINICAL DATA:  History of stage IV breast cancer now with right upper extremity pain and edema. Evaluate for DVT. EXAM: RIGHT UPPER EXTREMITY VENOUS DOPPLER ULTRASOUND TECHNIQUE: Gray-scale sonography with graded compression, as well as color Doppler and duplex ultrasound were performed to evaluate the upper extremity deep venous system from the level of the subclavian vein and including the jugular, axillary, basilic, radial, ulnar and upper cephalic vein. Spectral Doppler was utilized to evaluate flow at rest and with distal augmentation maneuvers. COMPARISON:  None. FINDINGS: Contralateral Subclavian Vein: Respiratory phasicity is normal and symmetric with the symptomatic side. No evidence of thrombus. Normal compressibility. Internal Jugular Vein:  No evidence of thrombus. Normal compressibility, respiratory phasicity and response to augmentation. Subclavian Vein: No evidence of thrombus. Normal compressibility, respiratory phasicity and response to augmentation. Axillary Vein: Appears patent where imaged though evaluation degraded secondary to patient's discomfort with sonographic evaluation of the axilla. Cephalic Vein: No evidence of thrombus. Normal compressibility, respiratory phasicity and response to augmentation. Basilic Vein: No evidence of thrombus. Normal compressibility, respiratory phasicity and response to augmentation. Brachial Veins: No evidence of thrombus within either of the paired brachial veins. Normal compressibility, respiratory phasicity and response to augmentation. Radial Veins:  No evidence of thrombus. Normal compressibility, respiratory phasicity and response to augmentation. Ulnar Veins: No evidence of thrombus. Normal compressibility, respiratory phasicity and response to augmentation. Venous Reflux:  None visualized. Other Findings: There is a moderate amount of subcutaneous edema at the level of the right upper arm. IMPRESSION: No evidence of DVT within the right upper extremity. Electronically Signed   By: Sandi Mariscal M.D.   On: 10/08/2020 14:29   ECHOCARDIOGRAM COMPLETE  Result Date: 10/10/2020    ECHOCARDIOGRAM REPORT   Patient Name:   JANAISHA TOLSMA Date of Exam: 10/10/2020 Medical Rec #:  [id number]           Height:       70.0 in Accession #:    [id number]          Weight:       178.7 lb Date of Birth:  12-28-1965           BSA:          1.990 m Patient Age:    71 years            BP:           119/79 mmHg Patient Gender: F                   HR:           105 bpm. Exam Location:  [facility] Procedure: 2D Echo, Limited Color Doppler and Cardiac Doppler Indications:     R06.03 Acute Respiratory Distress  History:         Patient has no prior history of Echocardiogram examinations.                  Signs/Symptoms:Shortness  of Breath. Breast cancer.  Sonographer:     Charmayne Sheer RDCS (AE) Referring Phys:  [id number] Sharen Hones Diagnosing Phys: Kathlyn Sacramento MD  Sonographer Comments: Technically challenging study due to limited acoustic windows. IMPRESSIONS  1. Left ventricular ejection fraction, by estimation, is 50 to 55%. The left ventricle has low normal function. Left ventricular endocardial border not optimally defined to evaluate regional wall motion. Left ventricular diastolic parameters are indeterminate.  2. Right ventricular systolic function is normal. The right ventricular size is normal. Tricuspid regurgitation signal is inadequate for assessing PA pressure.  3. The mitral valve is normal in structure. No evidence of mitral valve regurgitation. No evidence of mitral stenosis.  4. The aortic valve is normal in structure. Aortic valve regurgitation is not visualized. No aortic stenosis is present.  5. The inferior vena cava is normal in size with greater than 50% respiratory variability, suggesting right atrial pressure of 3 mmHg. FINDINGS  Left Ventricle: Left ventricular ejection fraction, by estimation, is 50 to 55%. The left ventricle has low normal function. Left ventricular endocardial border not optimally defined to evaluate regional wall motion. The left ventricular internal cavity  size was normal in size. There is no left ventricular hypertrophy. Left ventricular diastolic parameters are indeterminate. Right Ventricle: The right ventricular size is normal. No increase in right ventricular wall thickness. Right ventricular systolic function is normal. Tricuspid regurgitation signal is inadequate for assessing PA pressure. Left Atrium: Left atrial size was normal in size. Right Atrium: Right atrial size was normal in size. Pericardium: There is no evidence of pericardial effusion. Mitral Valve: The mitral valve is normal in structure. No evidence of mitral valve regurgitation. No evidence of mitral valve stenosis. MV  peak gradient, 4.5 mmHg. The mean mitral valve  gradient is 2.0 mmHg. Tricuspid Valve: The tricuspid valve is normal in structure. Tricuspid valve regurgitation is not demonstrated. No evidence of tricuspid stenosis. Aortic Valve: The aortic valve is normal in structure. Aortic valve regurgitation is not visualized. No aortic stenosis is present. Aortic valve mean gradient measures 4.0 mmHg. Aortic valve peak gradient measures 6.8 mmHg. Pulmonic Valve: The pulmonic valve was not well visualized. Pulmonic valve regurgitation is not visualized. No evidence of pulmonic stenosis. Aorta: The aortic root is normal in size and structure. Venous: The inferior vena cava is normal in size with greater than 50% respiratory variability, suggesting right atrial pressure of 3 mmHg. IAS/Shunts: No atrial level shunt detected by color flow Doppler.   LV Volumes (MOD) LV vol d, MOD A4C: 99.4 ml Diastology LV vol s, MOD A4C: 58.3 ml LV e' lateral:   10.30 cm/s LV SV MOD A4C:     99.4 ml LV E/e' lateral: 5.8  LEFT ATRIUM           Index LA Vol (A4C): 41.5 ml 20.86 ml/m  AORTIC VALVE AV Vmax:           130.00 cm/s AV Vmean:          93.400 cm/s AV VTI:            0.243 m AV Peak Grad:      6.8 mmHg AV Mean Grad:      4.0 mmHg LVOT Vmax:         92.70 cm/s LVOT Vmean:        60.500 cm/s LVOT VTI:          0.151 m LVOT/AV VTI ratio: 0.62 MITRAL VALVE MV Area (PHT): 6.96 cm    SHUNTS MV Peak grad:  4.5 mmHg    Systemic VTI: 0.15 m MV Mean grad:  2.0 mmHg MV Vmax:       1.06 m/s MV Vmean:      67.9 cm/s MV Decel Time: 109 msec MV E velocity: 59.60 cm/s MV A velocity: 68.60 cm/s MV E/A ratio:  0.87 Kathlyn Sacramento MD Electronically signed by Kathlyn Sacramento MD Signature Date/Time: 10/10/2020/2:00:06 PM    Final     PERFORMANCE STATUS (ECOG) : 3 - Symptomatic, >50% confined to bed  Review of Systems Unless otherwise noted, a complete review of systems is negative.  Physical Exam General: NAD Pulmonary: Unlabored, on O2 Extremities:  no edema, no joint deformities Skin: Fungating breast mass noted but not visualized Neurological: Weakness but otherwise nonfocal  IMPRESSION: Patient known to me from the clinic.  In the past, she has been somewhat reluctant to discuss goals or prognosis.  Today, she seemed more open to speaking about these topics.  She says that she understands that her cancer is progressing, is ultimately incurable, and likely to result in her future end-of-life.  She has been offered options for second line treatment but is undecided if she wants to pursue them.  We talked at length about the nature of quality of life with terminal cancer.  I explored the idea of supportive care and hospice should she ultimately decide to forego treatment.  We also talked about CODE STATUS, a topic that patient has also previously been reluctant to discuss.  She says that she wants to speak with her daughter about decision-making.  Her daughter has been previously named as her healthcare power of attorney and those documents are available in [place].   Patient says that she blames herself for waiting so  long to pursue treatment for her breast cancer.  She says that she delayed seeking care due to fear of the COVID-19 epidemic.  She says that she thinks about this daily and wonders if her prognosis would have been different had she pursued care earlier.  Emotional support was provided.  Patient declined the offer for a chaplain.  Patient declined my offer to call her daughter.  She says that her daughter was present during the meeting with Dr. Tasia Catchings and is updated on the current medical problems and options for future treatment.  PLAN: -Continue current scope of treatment -Patient considering decision making -Will follow  Case and plan discussed with Dr. Tasia Catchings   Time Total: 60 minutes  Visit consisted of counseling and education dealing with the complex and emotionally intense issues of symptom management and palliative care in  the setting of serious and potentially life-threatening illness.Greater than 50%  of this time was spent counseling and coordinating care related to the above assessment and plan.  Signed by: Altha Harm, PhD, NP-C

## 2020-10-12 NOTE — Progress Notes (Signed)
PROGRESS NOTE  Jessica Willis WLS:[id number] DOB: Dec 21, 1965 DOA: 10/07/2020 PCP: [place]  Brief History   55 year old woman PMH stage IV metastatic breast cancer on chemotherapy, malignant pleural effusion, hemothorax presented with shortness of breath.  Admitted for acute hypoxic respiratory failure, sepsis thought secondary to pneumonia complicated by metastatic breast cancer.  A & P  Acute hypoxic respiratory failure secondary to pneumonia versus metastatic disease/lymphangitic spread of cancer progression.  Sepsis considered on admission.  Extensive airspace abnormalities of the right lung and loculated pleural effusion, infectious versus malignant. --Oxygen requirement slowly worsening, 7 L today compared to 6 from yesterday.  Leukocytosis probably related to malignancy. --Continues on antibiotics although not clearly infection --Continue current treatments, appreciate infectious disease, oncology.  Pulmonology consultation pending.  Stage IV metastatic fungating breast cancer with large necrotizing mass, high tumor burden, progressed on weekly Taxol last dose 09/15/2020 --As per oncology.  Other treatments being considered in the outpatient setting.  Fungating right breast mass with drainage --Continue wound care, goal directed towards observing drainage and decreasing discomfort.  Iron deficiency anemia  --Stable.  Chronic tachycardia --Continue beta-blocker  Goals of care --disease not curable, refractory to first-line chemotherapy, aggressive in nature.  Palliative care following.  Worsening oxygenation appears to be fairly slow.  Appreciate all involved, hospice appears to be reasonable.  Long-term prognosis grim.  Disposition Plan:  Discussion: complex case, poor prognosis, will continue current care  Status is: Inpatient  Remains inpatient appropriate because:IV treatments appropriate due to intensity of illness or inability to take PO and  Inpatient level of care appropriate due to severity of illness   Dispo: The patient is from: Home              Anticipated d/c is to: TBD              Patient currently is not medically stable to d/c.   Difficult to place patient No  DVT prophylaxis: enoxaparin (LOVENOX) injection 40 mg Start: 10/07/20 2200   Code Status: Full Code Level of care: Progressive Cardiac Family Communication: none  Murray Hodgkins, MD  [facility] Direct contact: see [email or web address] (further directions at bottom of note if needed) 7PM-7AM contact night coverage as at bottom of note 10/12/2020, 12:47 PM  LOS: 5 days   Significant Hospital Events   .    Consults:  . Oncology . PMT . pulmonology   Procedures:  .   Significant Diagnostic Tests:  Marland Kitchen    Micro Data:  .    Antimicrobials:  .   Interval History/Subjective  CC: f/u SOB  Feels ok, still SOB, no pain, no n/v.  Objective   Vitals:  Vitals:   10/12/20 0825 10/12/20 1219  BP: 122/73 110/79  Pulse: (!) 125 (!) 108  Resp: 20 18  Temp: 98.2 F (36.8 C) 98.7 F (37.1 C)  SpO2: 93% 91%    Exam:  Constitutional:   . Appears calm, uncomfortable, ill, nontoxic Eyes:  . pupils and irises appear normal ENMT:  . grossly normal hearing  Respiratory:  . CTA bilaterally, no w/r/r.  . Respiratory effort moderately increased, able to speak in sentences . [place] at [place] Cardiovascular:  . Tachycardic, regular rhythm, no m/r/g . No LE extremity edema   . Telemetry ST, brief SVT Psychiatric:  . Mental status o Mood depressed, affect appropriate  I have personally reviewed the following:   Today's Data  . BMP, Mg WNL . WBC at 23, stable . Hgb stable at  10.5  Scheduled Meds: . calcium carbonate  1,250 mg Oral Q breakfast  . Chlorhexidine Gluconate Cloth  6 each Topical Daily  . enoxaparin (LOVENOX) injection  40 mg Subcutaneous Q24H  . feeding supplement  237 mL Oral TID BM  . ferrous sulfate  325 mg Oral BID WC  . magnesium  chloride  1 tablet Oral Daily  . melatonin  5 mg Oral QHS  . metoprolol tartrate  50 mg Oral BID  . senna-docusate  2 tablet Oral BID  . sodium chloride flush  10-40 mL Intracatheter Q12H   Continuous Infusions: . ampicillin-sulbactam (UNASYN) IV 3 g (10/12/20 0920)    Principal Problem:   HCAP (healthcare-associated pneumonia) Active Problems:   Sepsis ([place])   Acute respiratory failure with hypoxia (HCC)   Metastatic breast cancer (HCC)   Tachycardia   Iron deficiency anemia   LOS: 5 days   How to contact the [facility] Attending or Consulting provider [place] or covering provider during after hours [place], for this patient?  1. Check the care team in [facility] and look for a) attending/consulting [facility] provider listed and b) the [facility] team listed 2. Log into [email or web address] and use [place]'s universal password to access. If you do not have the password, please contact the hospital operator. 3. Locate the [facility] provider you are looking for under [facility] and page to a number that you can be directly reached. 4. If you still have difficulty reaching the provider, please page the [facility] (Director on Call) for the Hospitalists listed on amion for assistance.

## 2020-10-12 NOTE — Progress Notes (Signed)
Hematology/Oncology Progress Note [facility] [phone] [phone]  Patient Care Team: [place] as PCP - General Rico Junker, RN as Registered Nurse Benjamine Sprague, DO as Consulting Physician (Surgery)   Name of the patient: Jessica Willis  [id number]  1966/04/07  Date of visit: 10/12/20   INTERVAL HISTORY-  Patient was seen by ID Dr. Steva Ready yesterday.  Leukocytosis felt to be secondary to fungating necrotic breast mass. Procalcitonin was considered not reliable in patient with wide metastatic disease.  Antibiotics was switched to Unasyn.  Palliative care was consulted to further discuss about goals of care. Patient reports feeling slightly better today.  No appetite.  Shortness of breath with minimal exertion.  On nasal cannula oxygen.  Review of systems- Review of Systems  Constitutional: Positive for appetite change, fatigue and unexpected weight change. Negative for chills and fever.  HENT:   Negative for hearing loss and voice change.   Eyes: Negative for eye problems.  Respiratory: Positive for shortness of breath. Negative for chest tightness and cough.   Cardiovascular: Negative for chest pain.  Gastrointestinal: Negative for abdominal distention, abdominal pain and blood in stool.  Endocrine: Negative for hot flashes.  Genitourinary: Negative for difficulty urinating and frequency.   Musculoskeletal: Negative for arthralgias.  Skin: Negative for itching and rash.  Neurological: Negative for extremity weakness.  Hematological: Negative for adenopathy.  Psychiatric/Behavioral: Negative for confusion.    No Known Allergies  Patient Active Problem List   Diagnosis Date Noted   HCAP (healthcare-associated pneumonia) 10/07/2020   QT prolongation 09/22/2020   Pneumonia due to Streptococcus pyogenes ([place]) 08/22/2020   Genetic testing 07/27/2020   Pancreatic lesion 07/22/2020   Swelling  of lower leg 07/13/2020   Encounter for antineoplastic chemotherapy 71/01/2693   Complication of chemotherapy 07/04/2020   Family history of breast cancer    Port-A-Cath in place    Bone metastasis ([place])    Hypocalcemia    Folate deficiency    Gastroesophageal reflux disease without esophagitis    Weakness of right arm    Palliative care encounter    Iron deficiency anemia    Anemia, chronic disease    Tachycardia    Metastatic breast cancer (HCC)    Malignant pleural effusion    Chest tube in place    Goals of care, counseling/discussion    Hemothorax on right 06/16/2020   Acute respiratory failure with hypoxia ([place])    Breast mass, right 06/15/2020   Mastitis 06/15/2020   Pleural effusion 06/15/2020   Intestinal obstruction (HCC)    Leukocytosis    Hypokalemia 06/24/2015   Sepsis ([place]) 06/23/2015   CAP (community acquired pneumonia) 06/23/2015   Partial small bowel obstruction ([place]) 06/23/2015     Past Medical History:  Diagnosis Date   Anemia    Family history of breast cancer    Patient denies medical problems      Past Surgical History:  Procedure Laterality Date   BREAST BIOPSY  06/28/2020   Procedure: BREAST BIOPSY;  Surgeon: Benjamine Sprague, DO;  Location: [facility] ORS;  Service: General;;   PORTACATH PLACEMENT N/A 06/28/2020   Procedure: INSERTION PORT-A-CATH;  Surgeon: Benjamine Sprague, DO;  Location: [facility] ORS;  Service: General;  Laterality: N/A;   TUBAL LIGATION     VIDEO ASSISTED THORACOSCOPY (VATS)/THOROCOTOMY Right 06/23/2020   Procedure: ATTEMPTED VIDEO ASSISTED THORACOSCOPY (VATS);  Surgeon: Nestor Lewandowsky, MD;  Location: [facility] ORS;  Service: General;  Laterality: Right;    Social History  Socioeconomic History   Marital status: Single    Spouse name: Not on file   Number of children: Not on file   Years of education: Not on file   Highest education level: Not on file  Occupational History   Not on file  Tobacco  Use   Smoking status: Never Smoker   Smokeless tobacco: Never Used  Vaping Use   Vaping Use: Never used  Substance and Sexual Activity   Alcohol use: Not Currently    Alcohol/week: 1.0 standard drink    Types: 1 Cans of beer per week   Drug use: No   Sexual activity: Not on file  Other Topics Concern   Not on file  Social History Narrative   Not on file   Social Determinants of Health   Financial Resource Strain: Not on file  Food Insecurity: Not on file  Transportation Needs: Not on file  Physical Activity: Not on file  Stress: Not on file  Social Connections: Not on file  Intimate Partner Violence: Not on file     Family History  Problem Relation Age of Onset   Diabetes Other    Hypertension Other    Diabetes Maternal Aunt    Breast cancer Cousin        dx 55s   Breast cancer Cousin        dx 44s     Current Facility-Administered Medications:    acetaminophen (TYLENOL) tablet 650 mg, 650 mg, Oral, Q6H PRN, Ivor Costa, MD, 650 mg at 10/09/20 2144   albuterol (VENTOLIN HFA) 108 (90 Base) MCG/ACT inhaler 2 puff, 2 puff, Inhalation, Q4H PRN, Ivor Costa, MD   Ampicillin-Sulbactam (UNASYN) 3 g in sodium chloride 0.9 % 100 mL IVPB, 3 g, Intravenous, Q6H, Ravishankar, Jayashree, MD, Last Rate: 200 mL/hr at 10/12/20 0920, 3 g at 10/12/20 0920   calcium carbonate (OS-CAL - dosed in mg of elemental calcium) tablet 1,250 mg, 1,250 mg, Oral, Q breakfast, Ivor Costa, MD, 1,250 mg at 10/12/20 5093   Chlorhexidine Gluconate Cloth 2 % PADS 6 each, 6 each, Topical, Daily, Sharen Hones, MD, 6 each at 10/12/20 0916   dextromethorphan-guaiFENesin (Dunn DM) 30-600 MG per 12 hr tablet 1 tablet, 1 tablet, Oral, BID PRN, Ivor Costa, MD   enoxaparin (LOVENOX) injection 40 mg, 40 mg, Subcutaneous, Q24H, Niu, Xilin, MD, 40 mg at 10/11/20 2130   feeding supplement (ENSURE ENLIVE / ENSURE PLUS) liquid 237 mL, 237 mL, Oral, TID BM, Sharen Hones, MD, 237 mL at 10/12/20  0916   ferrous sulfate tablet 325 mg, 325 mg, Oral, BID WC, Ivor Costa, MD, 325 mg at 10/12/20 0916   lidocaine-prilocaine (EMLA) cream 1 application, 1 application, Topical, PRN, Ivor Costa, MD   magnesium chloride (SLOW-MAG) 64 MG SR tablet 64 mg, 1 tablet, Oral, Daily, Ivor Costa, MD, 64 mg at 10/12/20 0915   melatonin tablet 5 mg, 5 mg, Oral, QHS, Zhang, Danford Bad, MD, 5 mg at 10/11/20 2130   metoprolol tartrate (LOPRESSOR) tablet 50 mg, 50 mg, Oral, BID, Ivor Costa, MD, 50 mg at 10/12/20 0916   ondansetron (ZOFRAN) injection 4 mg, 4 mg, Intravenous, Q8H PRN, Ivor Costa, MD   senna-docusate (Senokot-S) tablet 2 tablet, 2 tablet, Oral, BID, Sharen Hones, MD, 2 tablet at 10/12/20 0917   sodium chloride flush (NS) 0.9 % injection 10-40 mL, 10-40 mL, Intracatheter, Q12H, Sharen Hones, MD, 10 mL at 10/12/20 0917   sodium chloride flush (NS) 0.9 % injection 10-40 mL, 10-40 mL, Intracatheter, PRN,  Sharen Hones, MD, 10 mL at 10/10/20 1447   Physical exam:  Vitals:   10/12/20 0015 10/12/20 0304 10/12/20 0825 10/12/20 1219  BP: 128/78 129/80 122/73 110/79  Pulse: (!) 113 (!) 111 (!) 125 (!) 108  Resp: (!) 21 20 20 18   Temp: 99 F (37.2 C) 98.2 F (36.8 C) 98.2 F (36.8 C) 98.7 F (37.1 C)  TempSrc: Oral Axillary Oral Oral  SpO2: 92% 91% 93% 91%  Weight:      Height:       Physical Exam Constitutional:      General: She is not in acute distress.    Appearance: She is not diaphoretic.  HENT:     Head: Normocephalic and atraumatic.     Nose: Nose normal.     Mouth/Throat:     Mouth: Oropharynx is clear and moist.     Pharynx: No oropharyngeal exudate.  Eyes:     General: No scleral icterus.    Extraocular Movements: EOM normal.     Pupils: Pupils are equal, round, and reactive to light.  Cardiovascular:     Rate and Rhythm: Normal rate and regular rhythm.     Heart sounds: No murmur heard.   Pulmonary:     Effort: Pulmonary effort is normal. No respiratory distress.      Comments: Decreased right lower base breath sound Abdominal:     General: There is no distension.     Palpations: Abdomen is soft.     Tenderness: There is no abdominal tenderness.  Musculoskeletal:        General: No edema. Normal range of motion.     Cervical back: Normal range of motion and neck supple.  Skin:    General: Skin is warm and dry.     Findings: No erythema.  Neurological:     Mental Status: She is alert and oriented to person, place, and time.     Cranial Nerves: No cranial nerve deficit.     Motor: No abnormal muscle tone.     Coordination: Coordination normal.  Psychiatric:        Mood and Affect: Affect normal.   Declined breast examination.    CMP Latest Ref Rng & Units 10/12/2020  Glucose 70 - 99 mg/dL 95  BUN 6 - 20 mg/dL 23(H)  Creatinine 0.44 - 1.00 mg/dL 0.49  Sodium 135 - 145 mmol/L 142  Potassium 3.5 - 5.1 mmol/L 4.0  Chloride 98 - 111 mmol/L 103  CO2 22 - 32 mmol/L 29  Calcium 8.9 - 10.3 mg/dL 8.5(L)  Total Protein 6.5 - 8.1 g/dL -  Total Bilirubin 0.3 - 1.2 mg/dL -  Alkaline Phos 38 - 126 U/L -  AST 15 - 41 U/L -  ALT 0 - 44 U/L -   CBC Latest Ref Rng & Units 10/12/2020  WBC 4.0 - 10.5 K/uL 23.0(H)  Hemoglobin 12.0 - 15.0 g/dL 10.5(L)  Hematocrit 36.0 - 46.0 % 34.1(L)  Platelets 150 - 400 K/uL 347    RADIOGRAPHIC STUDIES: I have personally reviewed the radiological images as listed and agreed with the findings in the report. DG Chest 2 View  Result Date: 10/07/2020 CLINICAL DATA:  Hypoxia, shortness of breath, metastatic breast cancer EXAM: CHEST - 2 VIEW COMPARISON:  09/13/2020 FINDINGS: LEFT subclavian Port-A-Cath with tip projecting over RIGHT atrium. Enlargement of cardiac silhouette. Pulmonary vascular congestion. Persistent enlargement of RIGHT hilum question mass or adenopathy. Increased infiltrate of the mid to lower RIGHT lung. Small RIGHT pleural effusion  again seen. Accentuation of markings in LEFT perihilar region little changed.  No pneumothorax or acute osseous findings. IMPRESSION: Increased RIGHT perihilar infiltrate with persistent small RIGHT pleural effusion. Enlargement of RIGHT pulmonary hilum by adenopathy versus perihilar mass. Electronically Signed   By: Lavonia Dana M.D.   On: 10/07/2020 11:32   DG Chest 2 View  Result Date: 09/13/2020 CLINICAL DATA:  Metastatic breast cancer.  COVID-19. EXAM: CHEST - 2 VIEW COMPARISON:  June 28, 2020. FINDINGS: Stable cardiomediastinal silhouette. Left subclavian Port-A-Cath is unchanged in position. No pneumothorax is noted. Large right perihilar mass is noted concerning for metastatic disease. Mild right pleural effusion is noted. Right upper and lower lobe airspace opacities are noted concerning for pneumonia. Stable left basilar atelectasis or infiltrate is noted. Bony thorax is unremarkable. IMPRESSION: Large right perihilar mass is noted concerning for metastatic disease. Right upper and lower lobe airspace opacities are noted concerning for pneumonia. Mild right pleural effusion is noted. Electronically Signed   By: Marijo Conception M.D.   On: 09/13/2020 16:45   CT Angio Chest PE W and/or Wo Contrast  Result Date: 10/07/2020 CLINICAL DATA:  Breast carcinoma. Tachycardia and shortness of breath EXAM: CT ANGIOGRAPHY CHEST WITH CONTRAST TECHNIQUE: Multidetector CT imaging of the chest was performed using the standard protocol during bolus administration of intravenous contrast. Multiplanar CT image reconstructions and MIPs were obtained to evaluate the vascular anatomy. CONTRAST:  61mL OMNIPAQUE IOHEXOL 350 MG/ML SOLN COMPARISON:  CT angiogram chest June 15, 2020; chest radiograph October 07, 2020 FINDINGS: Cardiovascular: There is no evident pulmonary embolus. No appreciable thoracic aortic aneurysm or dissection. Visualized great vessels appear unremarkable. There is no appreciable pericardial effusion or pericardial thickening. Port-A-Cath tip is in the superior vena cava  near the cavoatrial junction. Mediastinum/Nodes: Thyroid appears normal. There are multiple axillary lymph nodes bilaterally, more severe on the right than on the left. The largest lymph node is seen in the right axillary region measuring 2.7 x 2.1 cm. There is adenopathy in the right supraclavicular and infraclavicular regions with extension of adenopathy into the right Peri carinal region. Largest individual lymph node in these areas measures 2.6 x 1.8 cm. Several left supraclavicular lymph nodes are also noted, largest measuring 1.7 x 1.3 cm. There are several subcentimeter mediastinal lymph nodes. There is extensive retrocrural adenopathy. The largest lymph node or collection of matted lymph nodes is seen inferior to the aorta on the left posteriorly measuring 3.4 x 2.1 cm. No esophageal lesions are evident. Lungs/Pleura: There is extensive airspace consolidation in portions of the right middle and lower lobes with interspersed areas of loculated pleural effusion. There is ill-defined airspace opacity consistent with pneumonia in the left upper lobe. Loculated fluid tracks along the left major fissure. There may well be associated metastasis within the fissure given the extensive nodularity in this area. There is a small left pleural effusion with left base atelectasis. Pleural metastases noted along each upper hemithorax, primarily posteriorly as well as in the right apex. Upper Abdomen: Retrocrural adenopathy extends into the upper abdominal region. Visualized upper abdominal structures otherwise appear unremarkable. Musculoskeletal: There is a large mass occupying the right breast and chest wall region with invasion of the deep muscles of the chest wall on the right. There is also invasion of the right axilla. There is extensive subcutaneous thickening in both breast regions. There is a mass in the lateral left breast with extension of soft tissue prominence along the lateral left hemithorax, likely due to  extension of neoplasm into the adjacent soft tissues. There is widespread sclerotic bony metastatic disease throughout the thoracic region. Review of the MIP images confirms the above findings. IMPRESSION: 1. No demonstrable pulmonary embolus. No thoracic aortic aneurysm or dissection. 2. Areas of airspace consolidation noted in the right middle and lower lobes. More ill-defined infiltrate is noted in the left upper lobe anteriorly. Areas of loculated fluid noted in the left major fissure. Nodularity in this area could indicate superimposed masses in left major fissure. There is pleural thickening along both posterior upper hemithorax regions, likely pleural metastases. Loculated fluid noted in areas of infiltrate on the right. Small left pleural effusion noted. 3. Adenopathy at multiple sites, most severe in the retrocrural regions bilaterally, right supraclavicular region, and right axillary region, although there is left axillary and left supraclavicular adenopathy. 4. Large mass lesions occupying the right breast with extension and invasion of the chest wall and right axillary regions. A lesser degree of metastatic disease to the left of midline in the subcutaneous tissues and lateral left breast/lateral left chest wall soft tissues noted. 5. Multifocal sclerotic bony metastatic disease throughout the thoracic region noted. Electronically Signed   By: Lowella Grip III M.D.   On: 10/07/2020 13:23   US Venous Img Upper Uni Right(DVT)  Result Date: 10/08/2020 CLINICAL DATA:  History of stage IV breast cancer now with right upper extremity pain and edema. Evaluate for DVT. EXAM: RIGHT UPPER EXTREMITY VENOUS DOPPLER ULTRASOUND TECHNIQUE: Gray-scale sonography with graded compression, as well as color Doppler and duplex ultrasound were performed to evaluate the upper extremity deep venous system from the level of the subclavian vein and including the jugular, axillary, basilic, radial, ulnar and upper  cephalic vein. Spectral Doppler was utilized to evaluate flow at rest and with distal augmentation maneuvers. COMPARISON:  None. FINDINGS: Contralateral Subclavian Vein: Respiratory phasicity is normal and symmetric with the symptomatic side. No evidence of thrombus. Normal compressibility. Internal Jugular Vein: No evidence of thrombus. Normal compressibility, respiratory phasicity and response to augmentation. Subclavian Vein: No evidence of thrombus. Normal compressibility, respiratory phasicity and response to augmentation. Axillary Vein: Appears patent where imaged though evaluation degraded secondary to patient's discomfort with sonographic evaluation of the axilla. Cephalic Vein: No evidence of thrombus. Normal compressibility, respiratory phasicity and response to augmentation. Basilic Vein: No evidence of thrombus. Normal compressibility, respiratory phasicity and response to augmentation. Brachial Veins: No evidence of thrombus within either of the paired brachial veins. Normal compressibility, respiratory phasicity and response to augmentation. Radial Veins: No evidence of thrombus. Normal compressibility, respiratory phasicity and response to augmentation. Ulnar Veins: No evidence of thrombus. Normal compressibility, respiratory phasicity and response to augmentation. Venous Reflux:  None visualized. Other Findings: There is a moderate amount of subcutaneous edema at the level of the right upper arm. IMPRESSION: No evidence of DVT within the right upper extremity. Electronically Signed   By: Sandi Mariscal M.D.   On: 10/08/2020 14:29   ECHOCARDIOGRAM COMPLETE  Result Date: 10/10/2020    ECHOCARDIOGRAM REPORT   Patient Name:   JULIETH TUGMAN Date of Exam: 10/10/2020 Medical Rec #:  [id number]           Height:       70.0 in Accession #:    [id number]          Weight:       178.7 lb Date of Birth:  1965/12/05           BSA:  1.990 m Patient Age:    9 years            BP:           119/79 mmHg  Patient Gender: F                   HR:           105 bpm. Exam Location:  [facility] Procedure: 2D Echo, Limited Color Doppler and Cardiac Doppler Indications:     R06.03 Acute Respiratory Distress  History:         Patient has no prior history of Echocardiogram examinations.                  Signs/Symptoms:Shortness of Breath. Breast cancer.  Sonographer:     Charmayne Sheer RDCS (AE) Referring Phys:  [id number] Sharen Hones Diagnosing Phys: Kathlyn Sacramento MD  Sonographer Comments: Technically challenging study due to limited acoustic windows. IMPRESSIONS  1. Left ventricular ejection fraction, by estimation, is 50 to 55%. The left ventricle has low normal function. Left ventricular endocardial border not optimally defined to evaluate regional wall motion. Left ventricular diastolic parameters are indeterminate.  2. Right ventricular systolic function is normal. The right ventricular size is normal. Tricuspid regurgitation signal is inadequate for assessing PA pressure.  3. The mitral valve is normal in structure. No evidence of mitral valve regurgitation. No evidence of mitral stenosis.  4. The aortic valve is normal in structure. Aortic valve regurgitation is not visualized. No aortic stenosis is present.  5. The inferior vena cava is normal in size with greater than 50% respiratory variability, suggesting right atrial pressure of 3 mmHg. FINDINGS  Left Ventricle: Left ventricular ejection fraction, by estimation, is 50 to 55%. The left ventricle has low normal function. Left ventricular endocardial border not optimally defined to evaluate regional wall motion. The left ventricular internal cavity  size was normal in size. There is no left ventricular hypertrophy. Left ventricular diastolic parameters are indeterminate. Right Ventricle: The right ventricular size is normal. No increase in right ventricular wall thickness. Right ventricular systolic function is normal. Tricuspid regurgitation signal is inadequate for assessing  PA pressure. Left Atrium: Left atrial size was normal in size. Right Atrium: Right atrial size was normal in size. Pericardium: There is no evidence of pericardial effusion. Mitral Valve: The mitral valve is normal in structure. No evidence of mitral valve regurgitation. No evidence of mitral valve stenosis. MV peak gradient, 4.5 mmHg. The mean mitral valve gradient is 2.0 mmHg. Tricuspid Valve: The tricuspid valve is normal in structure. Tricuspid valve regurgitation is not demonstrated. No evidence of tricuspid stenosis. Aortic Valve: The aortic valve is normal in structure. Aortic valve regurgitation is not visualized. No aortic stenosis is present. Aortic valve mean gradient measures 4.0 mmHg. Aortic valve peak gradient measures 6.8 mmHg. Pulmonic Valve: The pulmonic valve was not well visualized. Pulmonic valve regurgitation is not visualized. No evidence of pulmonic stenosis. Aorta: The aortic root is normal in size and structure. Venous: The inferior vena cava is normal in size with greater than 50% respiratory variability, suggesting right atrial pressure of 3 mmHg. IAS/Shunts: No atrial level shunt detected by color flow Doppler.   LV Volumes (MOD) LV vol d, MOD A4C: 99.4 ml Diastology LV vol s, MOD A4C: 58.3 ml LV e' lateral:   10.30 cm/s LV SV MOD A4C:     99.4 ml LV E/e' lateral: 5.8  LEFT ATRIUM  Index LA Vol (A4C): 41.5 ml 20.86 ml/m  AORTIC VALVE AV Vmax:           130.00 cm/s AV Vmean:          93.400 cm/s AV VTI:            0.243 m AV Peak Grad:      6.8 mmHg AV Mean Grad:      4.0 mmHg LVOT Vmax:         92.70 cm/s LVOT Vmean:        60.500 cm/s LVOT VTI:          0.151 m LVOT/AV VTI ratio: 0.62 MITRAL VALVE MV Area (PHT): 6.96 cm    SHUNTS MV Peak grad:  4.5 mmHg    Systemic VTI: 0.15 m MV Mean grad:  2.0 mmHg MV Vmax:       1.06 m/s MV Vmean:      67.9 cm/s MV Decel Time: 109 msec MV E velocity: 59.60 cm/s MV A velocity: 68.60 cm/s MV E/A ratio:  0.87 Kathlyn Sacramento MD Electronically  signed by Kathlyn Sacramento MD Signature Date/Time: 10/10/2020/2:00:06 PM    Final     Assessment and plan-   # #Acute respiratory failure with hypoxia HCAP/obstructive pneumonia vs  visceral crisis or both with lung involvement from metastatic triple negative breast cancer. blood culture negative. Not responding to cefepime.  Switch to Unasyn. Appreciate ID recommendation. Consult pulmonology and appreciate input to further optimize his breathing status..  Discussed with Dr. Lanney Gins who will see patient today.   #Metastatic triple negative breast cancer, patient has high tumor burden, progressed on first-line palliative chemotherapy with Taxol.  There are other treatment options available, i.e. Adriamycin +/- Cytoxan, Sacituzumab govitecan.  Immunotherapy is less likely beneficial given her CPS <1. Patient is very sick with visceral crisis/obstructive pneumonia. I do lengthy discussion with patient about goals of care.  She understands that her disease is very aggressive and the prognosis is poor.  Patient had a discussion with palliative care service Vonna Kotyk this morning. As of now she still desires further treatment.  Single agent Adriamycin may be considered as her inpatient option.  Outpatient combination Adriamycin +/- Cytoxan vs Sacituzumab govitecan. She is made aware that response rate of 30% to 35%, and treatment may further exacerbate her current condition, worsening breathing status.  Alternative option of considering comfort care/hospice was discussed. She is considering decision making.    #Right breast fungating mass with drainage.  Wound care   Thank you for allowing me to participate in the care of this patient.   Earlie Server, MD, PhD Hematology Oncology [facility] at [facility] Pager- [phone] 10/12/2020

## 2020-10-12 NOTE — Consult Note (Addendum)
Pulmonary Medicine          Date: 10/12/2020,   MRN# [id number] Jessica Willis 09/22/1965     AdmissionWeight: 77.6 kg                 CurrentWeight: 81.1 kg   Referring physician: Dr Tasia Catchings   CHIEF COMPLAINT:      HISTORY OF PRESENT ILLNESS   Jessica Willis is a 55 y.o. female with medical history significant of stage IV metastasized breast cancer on chemotherapy, tachycardia, GERD, anemia, malignant pleural effusion, hemothorax, who presents with shortness of breath. In ER initally presentedwith cough and sob. She had massive rt pleural effusion with collapse of rt lung . She also had rt breast mass with changes in skin color for  6 months . She underwent thoracenteiss and it was metastatic adenocarcinoma. MRI of the brain showed scattered metastatic implants upper cervical spine.  She had a port placement and also underwent incisional biopsy of the rt breast mass on 06/28/20.  She got a dose of taxol while inpatient and was discharged on 07/01/20. She followed up with oncologist as OP, on palliative chemotherapy with weekly taxol.  Has been having tachycardia and SOB- saw Cardiology on 09/26/20 Prolonged QT and tachycardia. Pt was found to have WBC 22.3, negative Covid PCR, lactic acid 2.3, INR 1.3, PTT 36, electrolytes renal function okay, temperature 99.5, blood pressure 138/80, heart rate 134, RR 20, oxygen saturation 87% on room air, which improved her to 100% on 2 L oxygen. PCCM consultation placed due to complicated respiratory status with metastatic lung cancer and large malignant effusion. CT chest below shows pleural effusion bilaterally worse on right with metastatic lesions visible on bony spine from thoracic spine down and significant hilar and mediastinal lymphadneopathy suggestive of widespread mets.     PAST MEDICAL HISTORY   Past Medical History:  Diagnosis Date  . Anemia   . Family history of breast cancer   . Patient denies medical problems       SURGICAL HISTORY   Past Surgical History:  Procedure Laterality Date  . BREAST BIOPSY  06/28/2020   Procedure: BREAST BIOPSY;  Surgeon: Benjamine Sprague, DO;  Location: [facility] ORS;  Service: General;;  . PORTACATH PLACEMENT N/A 06/28/2020   Procedure: INSERTION PORT-A-CATH;  Surgeon: Benjamine Sprague, DO;  Location: [facility] ORS;  Service: General;  Laterality: N/A;  . TUBAL LIGATION    . VIDEO ASSISTED THORACOSCOPY (VATS)/THOROCOTOMY Right 06/23/2020   Procedure: ATTEMPTED VIDEO ASSISTED THORACOSCOPY (VATS);  Surgeon: Nestor Lewandowsky, MD;  Location: [facility] ORS;  Service: General;  Laterality: Right;     FAMILY HISTORY   Family History  Problem Relation Age of Onset  . Diabetes Other   . Hypertension Other   . Diabetes Maternal Aunt   . Breast cancer Cousin        dx 59s  . Breast cancer Cousin        dx 35s     SOCIAL HISTORY   Social History   Tobacco Use  . Smoking status: Never Smoker  . Smokeless tobacco: Never Used  Vaping Use  . Vaping Use: Never used  Substance Use Topics  . Alcohol use: Not Currently    Alcohol/week: 1.0 standard drink    Types: 1 Cans of beer per week  . Drug use: No     MEDICATIONS    Home Medication:    Current Medication:  Current Facility-Administered Medications:  .  acetaminophen (TYLENOL) tablet 650 mg, 650  mg, Oral, Q6H PRN, Ivor Costa, MD, 650 mg at 10/09/20 2144 .  albuterol (VENTOLIN HFA) 108 (90 Base) MCG/ACT inhaler 2 puff, 2 puff, Inhalation, Q4H PRN, Ivor Costa, MD .  Ampicillin-Sulbactam (UNASYN) 3 g in sodium chloride 0.9 % 100 mL IVPB, 3 g, Intravenous, Q6H, Ravishankar, Jayashree, MD, Last Rate: 200 mL/hr at 10/12/20 0920, 3 g at 10/12/20 0920 .  calcium carbonate (OS-CAL - dosed in mg of elemental calcium) tablet 1,250 mg, 1,250 mg, Oral, Q breakfast, Ivor Costa, MD, 1,250 mg at 10/12/20 0916 .  Chlorhexidine Gluconate Cloth 2 % PADS 6 each, 6 each, Topical, Daily, Sharen Hones, MD, 6 each at 10/12/20 [phone] .   dextromethorphan-guaiFENesin (MUCINEX DM) 30-600 MG per 12 hr tablet 1 tablet, 1 tablet, Oral, BID PRN, Ivor Costa, MD .  enoxaparin (LOVENOX) injection 40 mg, 40 mg, Subcutaneous, Q24H, Ivor Costa, MD, 40 mg at 10/11/20 2130 .  feeding supplement (ENSURE ENLIVE / ENSURE PLUS) liquid 237 mL, 237 mL, Oral, TID BM, Sharen Hones, MD, 237 mL at 10/12/20 0916 .  ferrous sulfate tablet 325 mg, 325 mg, Oral, BID WC, Ivor Costa, MD, 325 mg at 10/12/20 0916 .  lidocaine-prilocaine (EMLA) cream 1 application, 1 application, Topical, PRN, Ivor Costa, MD .  magnesium chloride (SLOW-MAG) 64 MG SR tablet 64 mg, 1 tablet, Oral, Daily, Ivor Costa, MD, 64 mg at 10/12/20 0915 .  melatonin tablet 5 mg, 5 mg, Oral, QHS, Sharen Hones, MD, 5 mg at 10/11/20 2130 .  metoprolol tartrate (LOPRESSOR) tablet 50 mg, 50 mg, Oral, BID, Ivor Costa, MD, 50 mg at 10/12/20 0916 .  ondansetron (ZOFRAN) injection 4 mg, 4 mg, Intravenous, Q8H PRN, Ivor Costa, MD .  senna-docusate (Senokot-S) tablet 2 tablet, 2 tablet, Oral, BID, Sharen Hones, MD, 2 tablet at 10/12/20 [phone] .  sodium chloride flush (NS) 0.9 % injection 10-40 mL, 10-40 mL, Intracatheter, Q12H, Sharen Hones, MD, 10 mL at 10/12/20 0917 .  sodium chloride flush (NS) 0.9 % injection 10-40 mL, 10-40 mL, Intracatheter, PRN, Sharen Hones, MD, 10 mL at 10/10/20 1447    ALLERGIES   Patient has no known allergies.     REVIEW OF SYSTEMS    Review of Systems:  Gen:  Denies  fever, sweats, chills weigh loss  HEENT: Denies blurred vision, double vision, ear pain, eye pain, hearing loss, nose bleeds, sore throat Cardiac:  No dizziness, chest pain or heaviness, chest tightness,edema Resp:   Denies cough or sputum porduction, shortness of breath,wheezing, hemoptysis,  Gi: Denies swallowing difficulty, stomach pain, nausea or vomiting, diarrhea, constipation, bowel incontinence Gu:  Denies bladder incontinence, burning urine Ext:   Denies Joint pain, stiffness or  swelling Skin: Denies  skin rash, easy bruising or bleeding or hives Endoc:  Denies polyuria, polydipsia , polyphagia or weight change Psych:   Denies depression, insomnia or hallucinations   Other:  All other systems negative   VS: BP 110/79 (BP Location: Left Arm)   Pulse (!) 108   Temp 98.7 F (37.1 C) (Oral)   Resp 18   Ht 5\' 10"  (1.778 m)   Wt 81.1 kg   LMP 06/16/2015 Comment: Tubal Ligation   SpO2 91%   BMI 25.64 kg/m      PHYSICAL EXAM    GENERAL:NAD, no fevers, chills, no weakness no fatigue HEAD: Normocephalic, atraumatic.  EYES: Pupils equal, round, reactive to light. Extraocular muscles intact. No scleral icterus.  MOUTH: Moist mucosal membrane. Dentition intact. No abscess noted.  EAR, NOSE, THROAT:  Clear without exudates. No external lesions.  NECK: Supple. No thyromegaly. No nodules. No JVD.  PULMONARY: Diffuse coarse rhonchi right sided +wheezes CARDIOVASCULAR: S1 and S2. Regular rate and rhythm. No murmurs, rubs, or gallops. No edema. Pedal pulses 2+ bilaterally.  GASTROINTESTINAL: Soft, nontender, nondistended. No masses. Positive bowel sounds. No hepatosplenomegaly.  MUSCULOSKELETAL: No swelling, clubbing, or edema. Range of motion full in all extremities.  NEUROLOGIC: Cranial nerves II through XII are intact. No gross focal neurological deficits. Sensation intact. Reflexes intact.  SKIN: No ulceration, lesions, rashes, or cyanosis. Skin warm and dry. Turgor intact.  PSYCHIATRIC: Mood, affect within normal limits. The patient is awake, alert and oriented x 3. Insight, judgment intact.       IMAGING    DG Chest 2 View  Result Date: 10/07/2020 CLINICAL DATA:  Hypoxia, shortness of breath, metastatic breast cancer EXAM: CHEST - 2 VIEW COMPARISON:  09/13/2020 FINDINGS: LEFT subclavian Port-A-Cath with tip projecting over RIGHT atrium. Enlargement of cardiac silhouette. Pulmonary vascular congestion. Persistent enlargement of RIGHT hilum question mass or  adenopathy. Increased infiltrate of the mid to lower RIGHT lung. Small RIGHT pleural effusion again seen. Accentuation of markings in LEFT perihilar region little changed. No pneumothorax or acute osseous findings. IMPRESSION: Increased RIGHT perihilar infiltrate with persistent small RIGHT pleural effusion. Enlargement of RIGHT pulmonary hilum by adenopathy versus perihilar mass. Electronically Signed   By: Lavonia Dana M.D.   On: 10/07/2020 11:32   DG Chest 2 View  Result Date: 09/13/2020 CLINICAL DATA:  Metastatic breast cancer.  COVID-19. EXAM: CHEST - 2 VIEW COMPARISON:  June 28, 2020. FINDINGS: Stable cardiomediastinal silhouette. Left subclavian Port-A-Cath is unchanged in position. No pneumothorax is noted. Large right perihilar mass is noted concerning for metastatic disease. Mild right pleural effusion is noted. Right upper and lower lobe airspace opacities are noted concerning for pneumonia. Stable left basilar atelectasis or infiltrate is noted. Bony thorax is unremarkable. IMPRESSION: Large right perihilar mass is noted concerning for metastatic disease. Right upper and lower lobe airspace opacities are noted concerning for pneumonia. Mild right pleural effusion is noted. Electronically Signed   By: Marijo Conception M.D.   On: 09/13/2020 16:45   CT Angio Chest PE W and/or Wo Contrast  Result Date: 10/07/2020 CLINICAL DATA:  Breast carcinoma. Tachycardia and shortness of breath EXAM: CT ANGIOGRAPHY CHEST WITH CONTRAST TECHNIQUE: Multidetector CT imaging of the chest was performed using the standard protocol during bolus administration of intravenous contrast. Multiplanar CT image reconstructions and MIPs were obtained to evaluate the vascular anatomy. CONTRAST:  45mL OMNIPAQUE IOHEXOL 350 MG/ML SOLN COMPARISON:  CT angiogram chest June 15, 2020; chest radiograph October 07, 2020 FINDINGS: Cardiovascular: There is no evident pulmonary embolus. No appreciable thoracic aortic aneurysm or  dissection. Visualized great vessels appear unremarkable. There is no appreciable pericardial effusion or pericardial thickening. Port-A-Cath tip is in the superior vena cava near the cavoatrial junction. Mediastinum/Nodes: Thyroid appears normal. There are multiple axillary lymph nodes bilaterally, more severe on the right than on the left. The largest lymph node is seen in the right axillary region measuring 2.7 x 2.1 cm. There is adenopathy in the right supraclavicular and infraclavicular regions with extension of adenopathy into the right Peri carinal region. Largest individual lymph node in these areas measures 2.6 x 1.8 cm. Several left supraclavicular lymph nodes are also noted, largest measuring 1.7 x 1.3 cm. There are several subcentimeter mediastinal lymph nodes. There is extensive retrocrural adenopathy. The largest lymph node or collection of  matted lymph nodes is seen inferior to the aorta on the left posteriorly measuring 3.4 x 2.1 cm. No esophageal lesions are evident. Lungs/Pleura: There is extensive airspace consolidation in portions of the right middle and lower lobes with interspersed areas of loculated pleural effusion. There is ill-defined airspace opacity consistent with pneumonia in the left upper lobe. Loculated fluid tracks along the left major fissure. There may well be associated metastasis within the fissure given the extensive nodularity in this area. There is a small left pleural effusion with left base atelectasis. Pleural metastases noted along each upper hemithorax, primarily posteriorly as well as in the right apex. Upper Abdomen: Retrocrural adenopathy extends into the upper abdominal region. Visualized upper abdominal structures otherwise appear unremarkable. Musculoskeletal: There is a large mass occupying the right breast and chest wall region with invasion of the deep muscles of the chest wall on the right. There is also invasion of the right axilla. There is extensive  subcutaneous thickening in both breast regions. There is a mass in the lateral left breast with extension of soft tissue prominence along the lateral left hemithorax, likely due to extension of neoplasm into the adjacent soft tissues. There is widespread sclerotic bony metastatic disease throughout the thoracic region. Review of the MIP images confirms the above findings. IMPRESSION: 1. No demonstrable pulmonary embolus. No thoracic aortic aneurysm or dissection. 2. Areas of airspace consolidation noted in the right middle and lower lobes. More ill-defined infiltrate is noted in the left upper lobe anteriorly. Areas of loculated fluid noted in the left major fissure. Nodularity in this area could indicate superimposed masses in left major fissure. There is pleural thickening along both posterior upper hemithorax regions, likely pleural metastases. Loculated fluid noted in areas of infiltrate on the right. Small left pleural effusion noted. 3. Adenopathy at multiple sites, most severe in the retrocrural regions bilaterally, right supraclavicular region, and right axillary region, although there is left axillary and left supraclavicular adenopathy. 4. Large mass lesions occupying the right breast with extension and invasion of the chest wall and right axillary regions. A lesser degree of metastatic disease to the left of midline in the subcutaneous tissues and lateral left breast/lateral left chest wall soft tissues noted. 5. Multifocal sclerotic bony metastatic disease throughout the thoracic region noted. Electronically Signed   By: Lowella Grip III M.D.   On: 10/07/2020 13:23   US Venous Img Upper Uni Right(DVT)  Result Date: 10/08/2020 CLINICAL DATA:  History of stage IV breast cancer now with right upper extremity pain and edema. Evaluate for DVT. EXAM: RIGHT UPPER EXTREMITY VENOUS DOPPLER ULTRASOUND TECHNIQUE: Gray-scale sonography with graded compression, as well as color Doppler and duplex ultrasound  were performed to evaluate the upper extremity deep venous system from the level of the subclavian vein and including the jugular, axillary, basilic, radial, ulnar and upper cephalic vein. Spectral Doppler was utilized to evaluate flow at rest and with distal augmentation maneuvers. COMPARISON:  None. FINDINGS: Contralateral Subclavian Vein: Respiratory phasicity is normal and symmetric with the symptomatic side. No evidence of thrombus. Normal compressibility. Internal Jugular Vein: No evidence of thrombus. Normal compressibility, respiratory phasicity and response to augmentation. Subclavian Vein: No evidence of thrombus. Normal compressibility, respiratory phasicity and response to augmentation. Axillary Vein: Appears patent where imaged though evaluation degraded secondary to patient's discomfort with sonographic evaluation of the axilla. Cephalic Vein: No evidence of thrombus. Normal compressibility, respiratory phasicity and response to augmentation. Basilic Vein: No evidence of thrombus. Normal compressibility, respiratory phasicity and  response to augmentation. Brachial Veins: No evidence of thrombus within either of the paired brachial veins. Normal compressibility, respiratory phasicity and response to augmentation. Radial Veins: No evidence of thrombus. Normal compressibility, respiratory phasicity and response to augmentation. Ulnar Veins: No evidence of thrombus. Normal compressibility, respiratory phasicity and response to augmentation. Venous Reflux:  None visualized. Other Findings: There is a moderate amount of subcutaneous edema at the level of the right upper arm. IMPRESSION: No evidence of DVT within the right upper extremity. Electronically Signed   By: Sandi Mariscal M.D.   On: 10/08/2020 14:29   ECHOCARDIOGRAM COMPLETE  Result Date: 10/10/2020    ECHOCARDIOGRAM REPORT   Patient Name:   Jessica Willis Date of Exam: 10/10/2020 Medical Rec #:  [id number]           Height:       70.0 in  Accession #:    [id number]          Weight:       178.7 lb Date of Birth:  [birth year]           BSA:          1.990 m Patient Age:    38 years            BP:           119/79 mmHg Patient Gender: F                   HR:           105 bpm. Exam Location:  [facility] Procedure: 2D Echo, Limited Color Doppler and Cardiac Doppler Indications:     R06.03 Acute Respiratory Distress  History:         Patient has no prior history of Echocardiogram examinations.                  Signs/Symptoms:Shortness of Breath. Breast cancer.  Sonographer:     Charmayne Sheer RDCS (AE) Referring Phys:  [id number] Sharen Hones Diagnosing Phys: Kathlyn Sacramento MD  Sonographer Comments: Technically challenging study due to limited acoustic windows. IMPRESSIONS  1. Left ventricular ejection fraction, by estimation, is 50 to 55%. The left ventricle has low normal function. Left ventricular endocardial border not optimally defined to evaluate regional wall motion. Left ventricular diastolic parameters are indeterminate.  2. Right ventricular systolic function is normal. The right ventricular size is normal. Tricuspid regurgitation signal is inadequate for assessing PA pressure.  3. The mitral valve is normal in structure. No evidence of mitral valve regurgitation. No evidence of mitral stenosis.  4. The aortic valve is normal in structure. Aortic valve regurgitation is not visualized. No aortic stenosis is present.  5. The inferior vena cava is normal in size with greater than 50% respiratory variability, suggesting right atrial pressure of 3 mmHg. FINDINGS  Left Ventricle: Left ventricular ejection fraction, by estimation, is 50 to 55%. The left ventricle has low normal function. Left ventricular endocardial border not optimally defined to evaluate regional wall motion. The left ventricular internal cavity  size was normal in size. There is no left ventricular hypertrophy. Left ventricular diastolic parameters are indeterminate. Right Ventricle: The right  ventricular size is normal. No increase in right ventricular wall thickness. Right ventricular systolic function is normal. Tricuspid regurgitation signal is inadequate for assessing PA pressure. Left Atrium: Left atrial size was normal in size. Right Atrium: Right atrial size was normal in size. Pericardium: There is no evidence of pericardial effusion. Mitral Valve: The mitral  valve is normal in structure. No evidence of mitral valve regurgitation. No evidence of mitral valve stenosis. MV peak gradient, 4.5 mmHg. The mean mitral valve gradient is 2.0 mmHg. Tricuspid Valve: The tricuspid valve is normal in structure. Tricuspid valve regurgitation is not demonstrated. No evidence of tricuspid stenosis. Aortic Valve: The aortic valve is normal in structure. Aortic valve regurgitation is not visualized. No aortic stenosis is present. Aortic valve mean gradient measures 4.0 mmHg. Aortic valve peak gradient measures 6.8 mmHg. Pulmonic Valve: The pulmonic valve was not well visualized. Pulmonic valve regurgitation is not visualized. No evidence of pulmonic stenosis. Aorta: The aortic root is normal in size and structure. Venous: The inferior vena cava is normal in size with greater than 50% respiratory variability, suggesting right atrial pressure of 3 mmHg. IAS/Shunts: No atrial level shunt detected by color flow Doppler.   LV Volumes (MOD) LV vol d, MOD A4C: 99.4 ml Diastology LV vol s, MOD A4C: 58.3 ml LV e' lateral:   10.30 cm/s LV SV MOD A4C:     99.4 ml LV E/e' lateral: 5.8  LEFT ATRIUM           Index LA Vol (A4C): 41.5 ml 20.86 ml/m  AORTIC VALVE AV Vmax:           130.00 cm/s AV Vmean:          93.400 cm/s AV VTI:            0.243 m AV Peak Grad:      6.8 mmHg AV Mean Grad:      4.0 mmHg LVOT Vmax:         92.70 cm/s LVOT Vmean:        60.500 cm/s LVOT VTI:          0.151 m LVOT/AV VTI ratio: 0.62 MITRAL VALVE MV Area (PHT): 6.96 cm    SHUNTS MV Peak grad:  4.5 mmHg    Systemic VTI: 0.15 m MV Mean grad:  2.0  mmHg MV Vmax:       1.06 m/s MV Vmean:      67.9 cm/s MV Decel Time: 109 msec MV E velocity: 59.60 cm/s MV A velocity: 68.60 cm/s MV E/A ratio:  0.87 Kathlyn Sacramento MD Electronically signed by Kathlyn Sacramento MD Signature Date/Time: 10/10/2020/2:00:06 PM    Final       ASSESSMENT/PLAN   Malignant pleural effusion    - since patient had initial therapeutic response to thoracentesis she will likely benefit from permament tunneled pleural catheter via IR service as palliative option for malignant effusion due to high probability of rapid re-accumulation    -there is no renal failure, hepatic synthetic function within reference range without HASH/cirhosis, no CHF per most recent TTE   - no lymphadema  Metastatic lung cancer   -Currently being followed by oncology Dr Tasia Catchings - appreciate input     Compressive atelectasis   Worse at Right lowe lobe - patient is weak and unlikely to be able to perform well with incentive spirometry.  She is a good candidate for albuterol infused Metaneb therapy can do BID to help with recruitment.     Chronic deconditioning and severe protein calorie malnutrition     -nutritional and PT evaluation     -palliative care on case - appreciate input     -bitemporal wasting noted on examination     - high risk refeeding syndrome - monitor electorlytes - will place consult for pharmD and RD   Acute  hypoxemic respiratory failure    - patient generally not on supplemental O2 currently on 7L/min supplemental O2    - she has leukocytosis but gram stain is negative on pleural fluid and there is no obvious infiltrate although basis bilaterally may have pneumonia, this is obscured by effusion and atelectatic sement.  Legionella and strep pneumo ag is negative. I feel that CAP is less likely.  Have ordered iflammatory markers and PCT.     - plan to start steroids to help with inflammatory component of this pneumonia     - lasix to help with residual effusion     -Soluedrol 20 bid  IV     - lasix 40 iv daily      - albumin 50mg  25% 1 amd daily with diuresis     - chest physitherapy to recruit atelectatic lungs     - respiratory viral panel to rule out non covid viral etiology      -etiology is all of the above conditions     - TTE with no findings to suggest CHF realted hypoxemia   Thank you for allowing me to participate in the care of this patient.   Patient/Family are satisfied with care plan and all questions have been answered.  This document was prepared using Dragon voice recognition software and may include unintentional dictation errors.     Ottie Glazier, M.D.  Division of [facility]

## 2020-10-12 NOTE — Consult Note (Signed)
PHARMACY CONSULT NOTE  Pharmacy Consult for Electrolyte Monitoring and Replacement   Recent Labs: Potassium (mmol/L)  Date Value  10/12/2020 4.0   Magnesium (mg/dL)  Date Value  10/12/2020 2.1   Calcium (mg/dL)  Date Value  10/12/2020 8.5 (L)   Albumin (g/dL)  Date Value  10/07/2020 2.9 (L)   Phosphorus (mg/dL)  Date Value  10/09/2020 3.2   Sodium (mmol/L)  Date Value  10/12/2020 142   Corrected calcium: 9.4 mg/dL  Assessment: Patient is a 55 y/o F with medical history including metastatic breast cancer on chemotherapy, tachycardia, anemia, GERD, malignant pleural effusion, hemothorax who was admitted 2/25 with respiratory failure secondary to pneumonia versus worsening metastatic disease. Pharmacy consulted to assist with electrolyte monitoring and replacement as indicated.   Per chart review, appetite appears poor.   Goal of Therapy:  Electrolytes within normal limits  Plan:  --No electrolyte replacement warranted at this time --Will follow-up electrolytes with AM labs  Benita Gutter 10/12/2020 3:38 PM

## 2020-10-12 NOTE — Hospital Course (Addendum)
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  Admitted for acute hypoxic respiratory failure, sepsis thought secondary to pneumonia complicated by metastatic breast cancer. Seen by pulmonology, ID, oncology and PMT.  Treated with antibiotics.  Plan is to continue aggressive treatment, started chemotherapy 3/4.  Major issue is acute hypoxic respiratory failure requiring 5-6 L nasal cannula.  Eventually home with home health once oxygen requirement stable.  Followed by pulmonology.  Weaning steroids.  A & P  Acute hypoxic respiratory failure secondary to pneumonia versus metastatic disease/lymphangitic spread of cancer progression.  Sepsis considered on admission.  Extensive airspace abnormalities of the right lung and loculated pleural effusion, infectious versus malignant. --Wean steroids as per pulmonology.  Continue antibiotics, Lasix, albumin as per pulmonology.  Stage IV metastatic fungating breast cancer with large necrotizing mass, high tumor burden, progressed on weekly Taxol last dose 09/15/2020 --Started chemotherapy 3/4.  Follow-up with oncology as an outpatient.  Fungating right breast mass with drainage --Continue wound care, goal directed towards observing drainage and decreasing discomfort.  Iron deficiency anemia  --Stable.  Chronic tachycardia --Continue beta-blocker  Goals of care --disease not curable, refractory to first-line chemotherapy, aggressive in nature.  Palliative care following.  Guarded prognosis.

## 2020-10-13 ENCOUNTER — Inpatient Hospital Stay: Payer: Medicaid Other

## 2020-10-13 ENCOUNTER — Other Ambulatory Visit: Payer: Self-pay | Admitting: Oncology

## 2020-10-13 DIAGNOSIS — D649 Anemia, unspecified: Secondary | ICD-10-CM | POA: Diagnosis not present

## 2020-10-13 DIAGNOSIS — J189 Pneumonia, unspecified organism: Secondary | ICD-10-CM | POA: Diagnosis not present

## 2020-10-13 DIAGNOSIS — J90 Pleural effusion, not elsewhere classified: Secondary | ICD-10-CM | POA: Diagnosis not present

## 2020-10-13 DIAGNOSIS — J9601 Acute respiratory failure with hypoxia: Secondary | ICD-10-CM | POA: Diagnosis not present

## 2020-10-13 DIAGNOSIS — C50919 Malignant neoplasm of unspecified site of unspecified female breast: Secondary | ICD-10-CM

## 2020-10-13 DIAGNOSIS — Z7189 Other specified counseling: Secondary | ICD-10-CM | POA: Diagnosis not present

## 2020-10-13 LAB — BASIC METABOLIC PANEL
Anion gap: 10 (ref 5–15)
BUN: 21 mg/dL — ABNORMAL HIGH (ref 6–20)
CO2: 31 mmol/L (ref 22–32)
Calcium: 8.1 mg/dL — ABNORMAL LOW (ref 8.9–10.3)
Chloride: 101 mmol/L (ref 98–111)
Creatinine, Ser: 0.39 mg/dL — ABNORMAL LOW (ref 0.44–1.00)
GFR, Estimated: >60 mL/min (ref >60)
Glucose, Bld: 109 mg/dL — ABNORMAL HIGH (ref 70–99)
Potassium: 4 mmol/L (ref 3.5–5.1)
Sodium: 142 mmol/L (ref 135–145)

## 2020-10-13 LAB — PHOSPHORUS: Phosphorus: 3.2 mg/dL (ref 2.5–4.6)

## 2020-10-13 IMAGING — DX DG CHEST 1V PORT
1 series · 1 of 1 positions shown · non-contrast
Comparison: [DATE]

CLINICAL DATA: Pleural effusion

EXAM:
PORTABLE CHEST 1 VIEW

[chest ap]
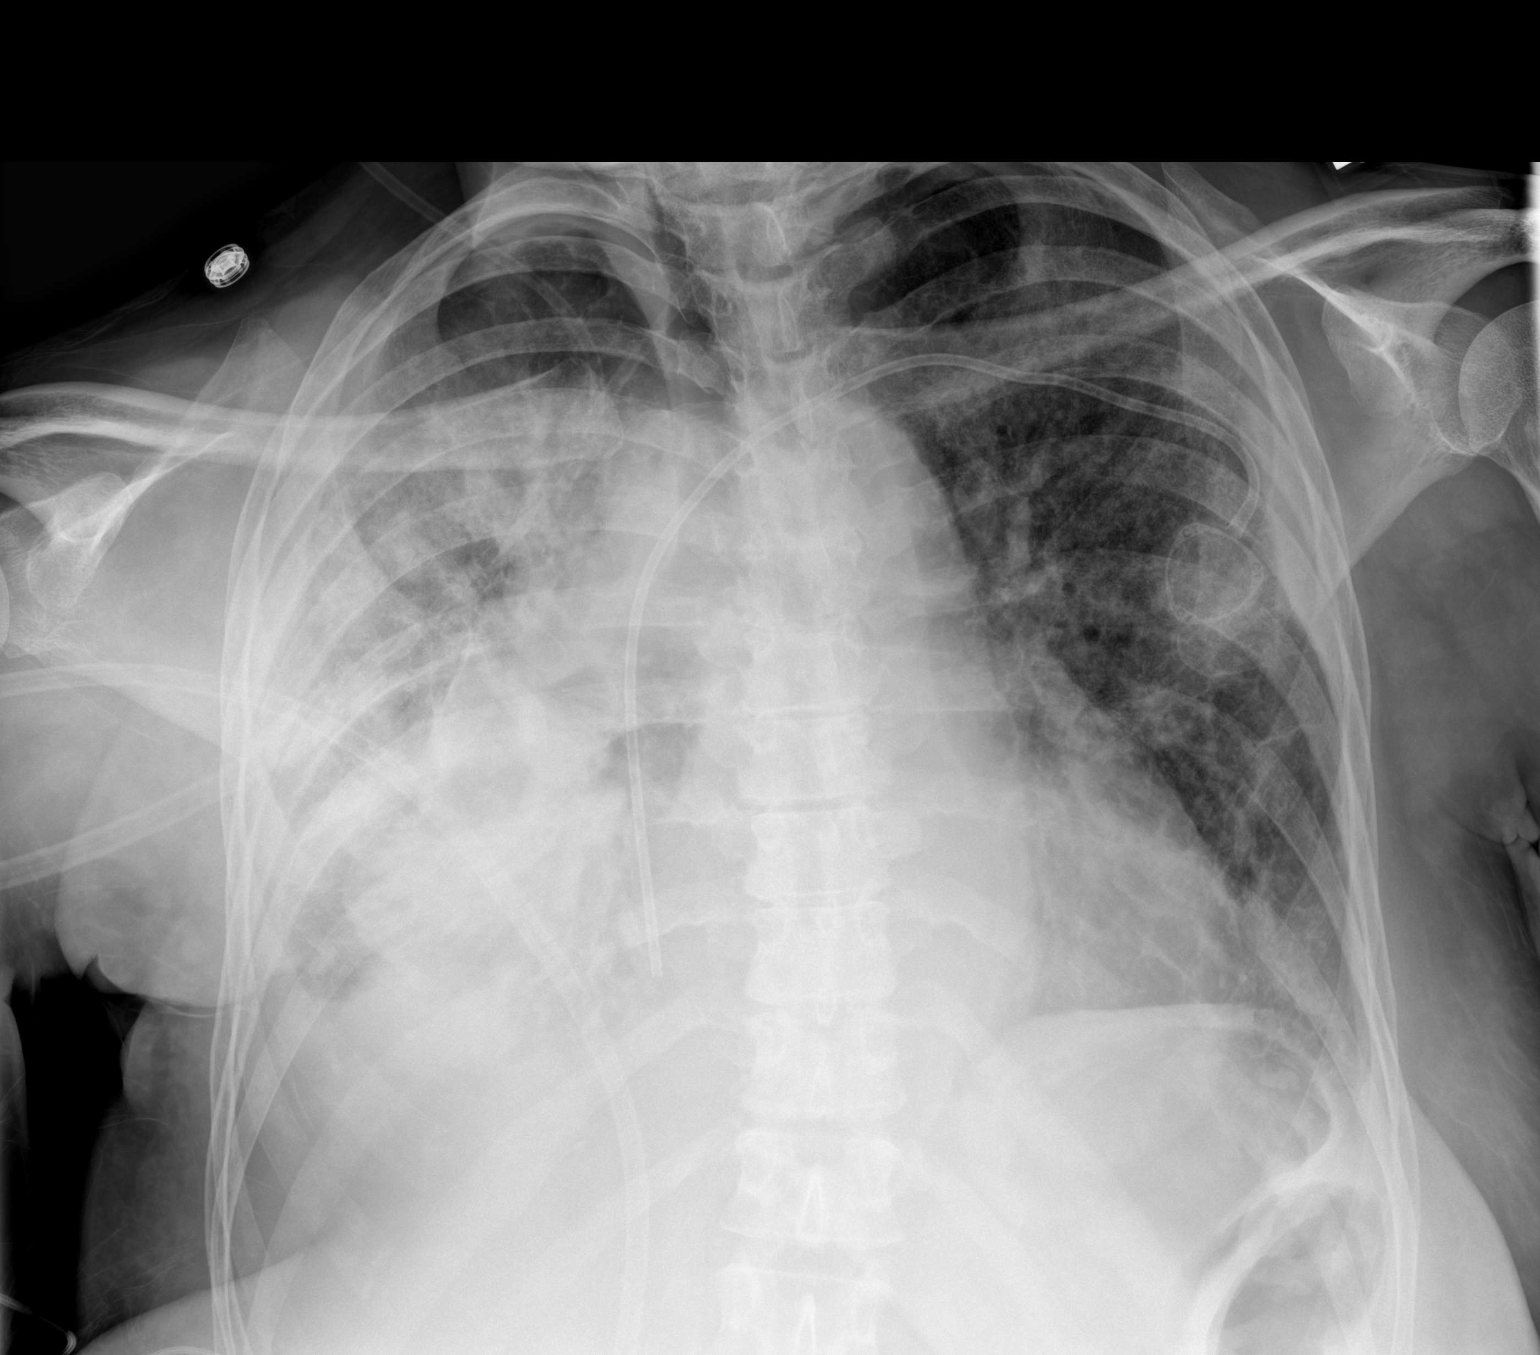

[1 of 1 positions shown; findings below may reference images not displayed]

FINDINGS: Unchanged positioning of left-sided Port-A-Cath. Stable
cardiomediastinal contours. Persistent small right pleural effusion.
Slight progression of airspace opacity throughout the right lung
most pronounced within the right lung base. Similar interstitial
opacities throughout the left lung. No pneumothorax.
IMPRESSION: 1. Slight progression of airspace opacity throughout the right lung,
most pronounced within the right lung base.
2. Persistent small right pleural effusion.

## 2020-10-13 MED ORDER — SALINE SPRAY 0.65 % NA SOLN
1.0000 | NASAL | Status: DC | PRN
  Filled 2020-10-13: qty 44

## 2020-10-13 MED ORDER — SENNA 8.6 MG PO TABS
2.0000 | ORAL_TABLET | Freq: Two times a day (BID) | ORAL | Status: DC
  Administered 2020-10-13 – 2020-10-18 (×11): 17.2 mg via ORAL
  Filled 2020-10-13 (×12): qty 2

## 2020-10-13 MED ORDER — DOCUSATE SODIUM 100 MG PO CAPS
100.0000 mg | ORAL_CAPSULE | Freq: Two times a day (BID) | ORAL | Status: DC
  Administered 2020-10-13 – 2020-10-18 (×11): 100 mg via ORAL
  Filled 2020-10-13 (×13): qty 1

## 2020-10-13 MED ORDER — ADULT MULTIVITAMIN W/MINERALS CH
1.0000 | ORAL_TABLET | Freq: Every day | ORAL | Status: DC
  Administered 2020-10-14 – 2020-10-21 (×8): 1 via ORAL
  Filled 2020-10-13 (×8): qty 1

## 2020-10-13 NOTE — Consult Note (Addendum)
PHARMACY CONSULT NOTE  Pharmacy Consult for Electrolyte Monitoring and Replacement   Recent Labs: Potassium (mmol/L)  Date Value  10/13/2020 4.0   Magnesium (mg/dL)  Date Value  10/12/2020 2.1   Calcium (mg/dL)  Date Value  10/13/2020 8.1 (L)   Albumin (g/dL)  Date Value  10/07/2020 2.9 (L)   Phosphorus (mg/dL)  Date Value  10/13/2020 3.2   Sodium (mmol/L)  Date Value  10/13/2020 142   Assessment: Patient is a 55 y/o F with medical history including metastatic breast cancer on chemotherapy, tachycardia, anemia, GERD, malignant pleural effusion, hemothorax who was admitted 2/25 with respiratory failure secondary to pneumonia versus worsening metastatic disease. Pharmacy consulted to assist with electrolyte monitoring and replacement as indicated.   Per chart review, appetite appears poor.   Diuresis: IV Lasix 40 mg daily  Goal of Therapy:  Electrolytes within normal limits  Plan:  --No electrolyte replacement warranted at this time --Will follow-up electrolytes with AM labs  Jessica Willis 10/13/2020 8:35 AM

## 2020-10-13 NOTE — Progress Notes (Signed)
PROGRESS NOTE  Jessica Willis OJJ:[id number] DOB: 10/27/1965 DOA: 10/07/2020 PCP: [facility]  Brief History   55 year old woman PMH stage IV metastatic breast cancer on chemotherapy, malignant pleural effusion, hemothorax presented with shortness of breath.  Admitted for acute hypoxic respiratory failure, sepsis thought secondary to pneumonia complicated by metastatic breast cancer. Seen by pulmonology, ID, oncology and PMT. Plan is to continue aggressive treatment.  A & P  Acute hypoxic respiratory failure secondary to pneumonia versus metastatic disease/lymphangitic spread of cancer progression.  Sepsis considered on admission.  Extensive airspace abnormalities of the right lung and loculated pleural effusion, infectious versus malignant. --Oxygen requirement decreased now to 4 L, appears clinically better. --Appreciate pulmonary involvement.  Continue antibiotics and respiratory treatments.  Stage IV metastatic fungating breast cancer with large necrotizing mass, high tumor burden, progressed on weekly Taxol last dose 09/15/2020 --Continue management as per oncology pending inpatient and outpatient chemotherapy being continued.  Fungating right breast mass with drainage --Continue wound care, goal directed towards observing drainage and decreasing discomfort.  Iron deficiency anemia  --Stable.  Chronic tachycardia --Continue beta-blocker  Goals of care --disease not curable, refractory to first-line chemotherapy, aggressive in nature.  Palliative care following.  Guarded prognosis.  Disposition Plan:  Discussion: will continue current care, prognosis appears guarded  Status is: Inpatient  Remains inpatient appropriate because:IV treatments appropriate due to intensity of illness or inability to take PO and Inpatient level of care appropriate due to severity of illness   Dispo: The patient is from: Home              Anticipated d/c is to: TBD               Patient currently is not medically stable to d/c.   Difficult to place patient No  DVT prophylaxis: enoxaparin (LOVENOX) injection 40 mg Start: 10/07/20 2200   Code Status: Full Code Level of care: Progressive Cardiac Family Communication: none  Murray Hodgkins, MD  [facility] Direct contact: see [email or web address] (further directions at bottom of note if needed) 7PM-7AM contact night coverage as at bottom of note 10/13/2020, 2:53 PM  LOS: 6 days   Significant Hospital Events   .    Consults:  . Oncology . PMT . pulmonology   Procedures:  .   Significant Diagnostic Tests:  Marland Kitchen    Micro Data:  .    Antimicrobials:  .   Interval History/Subjective  CC: f/u SOB  Feels better today. Breathing ok. No pain.  Objective   Vitals:  Vitals:   10/13/20 1106 10/13/20 1129  BP: 127/77   Pulse: (!) 124 (!) 106  Resp: 17   Temp: 97.6 F (36.4 C)   SpO2: 94%     Exam:  Constitutional:   . Appears calm and comfortable ENMT:  . grossly normal hearing  Respiratory:  . CTA bilaterally, no w/r/r.  . Respiratory effort normal. Cardiovascular:  . RRR, no m/r/g Psychiatric:  . Mental status o Mood, affect appropriate  I have personally reviewed the following:   Today's Data  . BMP WNL . CXR appears worse on right today  Scheduled Meds: . albuterol  2.5 mg Nebulization BID  . calcium carbonate  1,250 mg Oral Q breakfast  . Chlorhexidine Gluconate Cloth  6 each Topical Daily  . senna  2 tablet Oral BID   And  . docusate sodium  100 mg Oral BID  . enoxaparin (LOVENOX) injection  40 mg Subcutaneous Q24H  . feeding supplement  237 mL Oral TID BM  . ferrous sulfate  325 mg Oral BID WC  . furosemide  40 mg Intravenous Daily  . magnesium chloride  1 tablet Oral Daily  . melatonin  5 mg Oral QHS  . methylPREDNISolone (SOLU-MEDROL) injection  20 mg Intravenous Q12H  . metoprolol tartrate  50 mg Oral BID  . sodium chloride flush  10-40 mL Intracatheter Q12H    Continuous Infusions: . ampicillin-sulbactam (UNASYN) IV 3 g (10/13/20 1009)    Principal Problem:   HCAP (healthcare-associated pneumonia) Active Problems:   Sepsis ([place])   Acute respiratory failure with hypoxia (HCC)   Metastatic breast cancer (HCC)   Tachycardia   Iron deficiency anemia   LOS: 6 days   How to contact the [facility] Attending or Consulting provider [place] or covering provider during after hours [place], for this patient?  1. Check the care team in [facility] and look for a) attending/consulting [facility] provider listed and b) the [facility] team listed 2. Log into [email or web address] and use [place]'s universal password to access. If you do not have the password, please contact the hospital operator. 3. Locate the [facility] provider you are looking for under [facility] and page to a number that you can be directly reached. 4. If you still have difficulty reaching the provider, please page the [facility] (Director on Call) for the Hospitalists listed on amion for assistance.

## 2020-10-13 NOTE — Progress Notes (Addendum)
Nutrition Follow-up  DOCUMENTATION CODES:   Not applicable  INTERVENTION:   Ensure Enlive po TID, each supplement provides 350 kcal and 20 grams of protein  Magic cup BID with meals, each supplement provides 290 kcal and 9 grams of protein  MVI po daily   NUTRITION DIAGNOSIS:   Increased nutrient needs related to cancer and cancer related treatments as evidenced by estimated needs.  GOAL:   Patient will meet greater than or equal to 90% of their needs  -progressing   MONITOR:   PO intake,Labs,Weight trends,I & O's,Skin  ASSESSMENT:   55 year old female admitted with acute respiratory failure with hypoxia and severe sepsis due to HCAP. 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 Pt seen by oncologist in clinic and found to have oxygen desaturated to 87% on room air, tachycardia and reports shortness of breath over the last several days.   Met with pt in room today. Pt reports poor appetite and oral intake for several days pta but reports that her appetite is improving in hospital. Pt reports that she is eating fairly well today in hospital; pt also reports drinking 2-3 Ensure per day. RD discussed the importance of adequate nutrition needed to preserve lean muscle. Per chart, pt up 8lbs since admit but appears to be back to her UBW. Per chart, pt down 9lbs(5%) over the past 3 months; this is not significant.   Medications reviewed and include: oscal, senokot, colace, lovenox, ferrous sulfate, lasix, Mg chloride, melatonin, unasyn  Labs reviewed: K 4.0 wnl, BUN 21(H), creat 0.39(L), P 3.2 wnl Wbc- 23.0(H), Hgb 10.5(L), Hct 34.1(L)  NUTRITION - FOCUSED PHYSICAL EXAM:  Flowsheet Row Most Recent Value  Orbital Region No depletion  Upper Arm Region No depletion  Thoracic and Lumbar Region No depletion  Buccal Region No depletion  Temple Region Moderate  depletion  Clavicle Bone Region Unable to assess  Clavicle and Acromion Bone Region Unable to assess  Scapular Bone Region Mild depletion  Dorsal Hand Mild depletion  Patellar Region Mild depletion  Anterior Thigh Region Mild depletion  Posterior Calf Region Moderate depletion  Edema (RD Assessment) None  Hair Reviewed  Eyes Reviewed  Mouth Reviewed  Skin Reviewed  Nails Reviewed     Diet Order:   Diet Order            Diet regular Room service appropriate? Yes; Fluid consistency: Thin  Diet effective now                EDUCATION NEEDS:   Education needs have been addressed  Skin:  Skin Assessment: Skin Integrity Issues: Skin Integrity Issues:: Other (Comment) Other: open wound; anterior necrotic tumor; right breast  Last BM:  2/25  Height:   Ht Readings from Last 1 Encounters:  10/07/20 5' 10"  (1.778 m)    Weight:   Wt Readings from Last 1 Encounters:  10/13/20 81.4 kg   BMI:  Body mass index is 25.75 kg/m.  Estimated Nutritional Needs:   Kcal:  1900-2200kcal/day  Protein:  95-110g/day  Fluid:  2.0-2.3L/day  Koleen Distance MS, RD, LDN Please refer to [facility] for RD and/or RD on-call/weekend/after hours pager

## 2020-10-13 NOTE — Plan of Care (Signed)
  Problem: Education: Goal: Knowledge of General Education information will improve Description: Including pain rating scale, medication(s)/side effects and non-pharmacologic comfort measures Outcome: Progressing   Problem: Health Behavior/Discharge Planning: Goal: Ability to manage health-related needs will improve Outcome: Progressing   Problem: Clinical Measurements: Goal: Ability to maintain clinical measurements within normal limits will improve Outcome: Progressing Goal: Will remain free from infection Outcome: Progressing Goal: Diagnostic test results will improve Outcome: Progressing Goal: Respiratory complications will improve Outcome: Progressing Goal: Cardiovascular complication will be avoided Outcome: Progressing   Problem: Activity: Goal: Risk for activity intolerance will decrease Outcome: Progressing   Problem: Nutrition: Goal: Adequate nutrition will be maintained Outcome: Progressing   Problem: Coping: Goal: Level of anxiety will decrease Outcome: Progressing   Problem: Elimination: Goal: Will not experience complications related to bowel motility Outcome: Progressing Goal: Will not experience complications related to urinary retention Outcome: Progressing   Problem: Pain Managment: Goal: General experience of comfort will improve Outcome: Progressing   Problem: Safety: Goal: Ability to remain free from injury will improve Outcome: Progressing   Problem: Skin Integrity: Goal: Risk for impaired skin integrity will decrease Outcome: Progressing     Pt is AAOx4. Pt denies any pain. Wound care on right breast was done. Pt's VSS. Still no BM. All needs met. Call light is within reach. Safety measures maintained. Will continue to monitor.

## 2020-10-13 NOTE — Progress Notes (Signed)
DISCONTINUE ON PATHWAY REGIMEN - Breast     A cycle is every 28 days (3 weeks on and 1 week off):     Paclitaxel   **Always confirm dose/schedule in your pharmacy ordering system**  REASON: Disease Progression PRIOR TREATMENT: [id number]: Paclitaxel 80 mg/m2 D1, 8, 15 q28 Days TREATMENT RESPONSE: Progressive Disease (PD)  START ON PATHWAY REGIMEN - Breast     A cycle is every 21 days:     Doxorubicin   **Always confirm dose/schedule in your pharmacy ordering system**  Patient Characteristics: Distant Metastases or Locoregional Recurrent Disease - Unresected or Locally Advanced Unresectable Disease Progressing after Neoadjuvant and Local Therapies, HER2 Negative/Unknown/Equivocal, ER Negative/Unknown, Chemotherapy, Second Line Therapeutic Status: Distant Metastases ER Status: Negative (-) HER2 Status: Negative (-) PR Status: Negative (-) Therapy Approach Indicated: Standard Chemotherapy/Endocrine Therapy Line of Therapy: Second Line Intent of Therapy: Non-Curative / Palliative Intent, Discussed with Patient

## 2020-10-13 NOTE — Progress Notes (Signed)
Pulmonary Medicine          Date: 10/13/2020,   MRN# [id number] Jessica Willis 05-22-1966     AdmissionWeight: 77.6 kg                 CurrentWeight: 81.4 kg   Referring physician: Dr Tasia Catchings   CHIEF COMPLAINT:      HISTORY OF PRESENT ILLNESS   Jessica Willis is a 55 y.o. female with medical history significant of stage IV metastasized breast cancer on chemotherapy, tachycardia, GERD, anemia, malignant pleural effusion, hemothorax, who presents with shortness of breath. In ER initally presentedwith cough and sob. She had massive rt pleural effusion with collapse of rt lung . She also had rt breast mass with changes in skin color for  6 months . She underwent thoracenteiss and it was metastatic adenocarcinoma. MRI of the brain showed scattered metastatic implants upper cervical spine.  She had a port placement and also underwent incisional biopsy of the rt breast mass on 06/28/20.  She got a dose of taxol while inpatient and was discharged on 07/01/20. She followed up with oncologist as OP, on palliative chemotherapy with weekly taxol.  Has been having tachycardia and SOB- saw Cardiology on 09/26/20 Prolonged QT and tachycardia. Pt was found to have WBC 22.3, negative Covid PCR, lactic acid 2.3, INR 1.3, PTT 36, electrolytes renal function okay, temperature 99.5, blood pressure 138/80, heart rate 134, RR 20, oxygen saturation 87% on room air, which improved her to 100% on 2 L oxygen. PCCM consultation placed due to complicated respiratory status with metastatic lung cancer and large malignant effusion. CT chest below shows pleural effusion bilaterally worse on right with metastatic lesions visible on bony spine from thoracic spine down and significant hilar and mediastinal lymphadneopathy suggestive of widespread mets.   10/13/20- patient reports improved breathing, weaned O2 to 4L [place].  She is diuresing well which ishelping her breathing.  She responded well to steroids,  inflammatory markers are elevated likely due to active malignancy and CAP(although most of this is likely fluid and unclear how much is actual infection).  She feels strong enough to get up and participate with PT.  She is using IS and able to take tidal volume 500cc today.  I have encouraged to use every hour. I met with son and reviewed care plan.   PAST MEDICAL HISTORY   Past Medical History:  Diagnosis Date  . Anemia   . Family history of breast cancer   . Patient denies medical problems      SURGICAL HISTORY   Past Surgical History:  Procedure Laterality Date  . BREAST BIOPSY  06/28/2020   Procedure: BREAST BIOPSY;  Surgeon: Benjamine Sprague, DO;  Location: [facility] ORS;  Service: General;;  . PORTACATH PLACEMENT N/A 06/28/2020   Procedure: INSERTION PORT-A-CATH;  Surgeon: Benjamine Sprague, DO;  Location: [facility] ORS;  Service: General;  Laterality: N/A;  . TUBAL LIGATION    . VIDEO ASSISTED THORACOSCOPY (VATS)/THOROCOTOMY Right 06/23/2020   Procedure: ATTEMPTED VIDEO ASSISTED THORACOSCOPY (VATS);  Surgeon: Nestor Lewandowsky, MD;  Location: [facility] ORS;  Service: General;  Laterality: Right;     FAMILY HISTORY   Family History  Problem Relation Age of Onset  . Diabetes Other   . Hypertension Other   . Diabetes Maternal Aunt   . Breast cancer Cousin        dx 10s  . Breast cancer Cousin        dx 6s     SOCIAL  HISTORY   Social History   Tobacco Use  . Smoking status: Never Smoker  . Smokeless tobacco: Never Used  Vaping Use  . Vaping Use: Never used  Substance Use Topics  . Alcohol use: Not Currently    Alcohol/week: 1.0 standard drink    Types: 1 Cans of beer per week  . Drug use: No     MEDICATIONS    Home Medication:    Current Medication:  Current Facility-Administered Medications:  .  acetaminophen (TYLENOL) tablet 650 mg, 650 mg, Oral, Q6H PRN, Ivor Costa, MD, 650 mg at 10/09/20 2144 .  albuterol (PROVENTIL) (2.5 MG/3ML) 0.083% nebulizer solution 2.5 mg, 2.5 mg,  Nebulization, BID, Lanney Gins, Laythan Hayter, MD, 2.5 mg at 10/13/20 0834 .  albuterol (VENTOLIN HFA) 108 (90 Base) MCG/ACT inhaler 2 puff, 2 puff, Inhalation, Q4H PRN, Ivor Costa, MD .  Ampicillin-Sulbactam (UNASYN) 3 g in sodium chloride 0.9 % 100 mL IVPB, 3 g, Intravenous, Q6H, Ravishankar, Jayashree, MD, Last Rate: 200 mL/hr at 10/13/20 1009, 3 g at 10/13/20 1009 .  calcium carbonate (OS-CAL - dosed in mg of elemental calcium) tablet 1,250 mg, 1,250 mg, Oral, Q breakfast, Ivor Costa, MD, 1,250 mg at 10/12/20 0916 .  Chlorhexidine Gluconate Cloth 2 % PADS 6 each, 6 each, Topical, Daily, Sharen Hones, MD, 6 each at 10/13/20 1001 .  dextromethorphan-guaiFENesin (MUCINEX DM) 30-600 MG per 12 hr tablet 1 tablet, 1 tablet, Oral, BID PRN, Ivor Costa, MD .  senna (SENOKOT) tablet 17.2 mg, 2 tablet, Oral, BID **AND** docusate sodium (COLACE) capsule 100 mg, 100 mg, Oral, BID, Samuella Cota, MD .  enoxaparin (LOVENOX) injection 40 mg, 40 mg, Subcutaneous, Q24H, Ivor Costa, MD, 40 mg at 10/12/20 2152 .  feeding supplement (ENSURE ENLIVE / ENSURE PLUS) liquid 237 mL, 237 mL, Oral, TID BM, Sharen Hones, MD, 237 mL at 10/13/20 1002 .  ferrous sulfate tablet 325 mg, 325 mg, Oral, BID WC, Ivor Costa, MD, 325 mg at 10/13/20 1001 .  furosemide (LASIX) injection 40 mg, 40 mg, Intravenous, Daily, Lanney Gins, Rexton Greulich, MD, 40 mg at 10/13/20 1001 .  lidocaine-prilocaine (EMLA) cream 1 application, 1 application, Topical, PRN, Ivor Costa, MD .  magnesium chloride (SLOW-MAG) 64 MG SR tablet 64 mg, 1 tablet, Oral, Daily, Ivor Costa, MD, 64 mg at 10/13/20 1001 .  melatonin tablet 5 mg, 5 mg, Oral, QHS, Sharen Hones, MD, 5 mg at 10/12/20 2151 .  methylPREDNISolone sodium succinate (SOLU-MEDROL) 40 mg/mL injection 20 mg, 20 mg, Intravenous, Q12H, Lanney Gins, Monico Sudduth, MD, 20 mg at 10/13/20 0502 .  metoprolol tartrate (LOPRESSOR) tablet 50 mg, 50 mg, Oral, BID, Ivor Costa, MD, 50 mg at 10/13/20 1001 .  ondansetron (ZOFRAN) injection 4 mg,  4 mg, Intravenous, Q8H PRN, Ivor Costa, MD .  sodium chloride (OCEAN) 0.65 % nasal spray 1 spray, 1 spray, Each Nare, PRN, Samuella Cota, MD .  sodium chloride flush (NS) 0.9 % injection 10-40 mL, 10-40 mL, Intracatheter, Q12H, Sharen Hones, MD, 10 mL at 10/13/20 1002 .  sodium chloride flush (NS) 0.9 % injection 10-40 mL, 10-40 mL, Intracatheter, PRN, Sharen Hones, MD, 10 mL at 10/10/20 1447    ALLERGIES   Patient has no known allergies.     REVIEW OF SYSTEMS    Review of Systems:  Gen:  Denies  fever, sweats, chills weigh loss  HEENT: Denies blurred vision, double vision, ear pain, eye pain, hearing loss, nose bleeds, sore throat Cardiac:  No dizziness, chest pain or heaviness, chest tightness,edema  Resp:   Denies cough or sputum porduction, shortness of breath,wheezing, hemoptysis,  Gi: Denies swallowing difficulty, stomach pain, nausea or vomiting, diarrhea, constipation, bowel incontinence Gu:  Denies bladder incontinence, burning urine Ext:   Denies Joint pain, stiffness or swelling Skin: Denies  skin rash, easy bruising or bleeding or hives Endoc:  Denies polyuria, polydipsia , polyphagia or weight change Psych:   Denies depression, insomnia or hallucinations   Other:  All other systems negative   VS: BP 127/77 (BP Location: Left Arm)   Pulse (!) 106   Temp 97.6 F (36.4 C) (Oral)   Resp 17   Ht 5' 10"  (1.778 m)   Wt 81.4 kg   LMP 06/16/2015 Comment: Tubal Ligation   SpO2 94%   BMI 25.75 kg/m      PHYSICAL EXAM    GENERAL:NAD, no fevers, chills, no weakness no fatigue HEAD: Normocephalic, atraumatic.  EYES: Pupils equal, round, reactive to light. Extraocular muscles intact. No scleral icterus.  MOUTH: Moist mucosal membrane. Dentition intact. No abscess noted.  EAR, NOSE, THROAT: Clear without exudates. No external lesions.  NECK: Supple. No thyromegaly. No nodules. No JVD.  PULMONARY: Diffuse coarse rhonchi right sided +wheezes CARDIOVASCULAR: S1  and S2. Regular rate and rhythm. No murmurs, rubs, or gallops. No edema. Pedal pulses 2+ bilaterally.  GASTROINTESTINAL: Soft, nontender, nondistended. No masses. Positive bowel sounds. No hepatosplenomegaly.  MUSCULOSKELETAL: No swelling, clubbing, or edema. Range of motion full in all extremities.  NEUROLOGIC: Cranial nerves II through XII are intact. No gross focal neurological deficits. Sensation intact. Reflexes intact.  SKIN: No ulceration, lesions, rashes, or cyanosis. Skin warm and dry. Turgor intact.  PSYCHIATRIC: Mood, affect within normal limits. The patient is awake, alert and oriented x 3. Insight, judgment intact.       IMAGING    DG Chest 2 View  Result Date: 10/07/2020 CLINICAL DATA:  Hypoxia, shortness of breath, metastatic breast cancer EXAM: CHEST - 2 VIEW COMPARISON:  09/13/2020 FINDINGS: LEFT subclavian Port-A-Cath with tip projecting over RIGHT atrium. Enlargement of cardiac silhouette. Pulmonary vascular congestion. Persistent enlargement of RIGHT hilum question mass or adenopathy. Increased infiltrate of the mid to lower RIGHT lung. Small RIGHT pleural effusion again seen. Accentuation of markings in LEFT perihilar region little changed. No pneumothorax or acute osseous findings. IMPRESSION: Increased RIGHT perihilar infiltrate with persistent small RIGHT pleural effusion. Enlargement of RIGHT pulmonary hilum by adenopathy versus perihilar mass. Electronically Signed   By: Lavonia Dana M.D.   On: 10/07/2020 11:32   CT Angio Chest PE W and/or Wo Contrast  Result Date: 10/07/2020 CLINICAL DATA:  Breast carcinoma. Tachycardia and shortness of breath EXAM: CT ANGIOGRAPHY CHEST WITH CONTRAST TECHNIQUE: Multidetector CT imaging of the chest was performed using the standard protocol during bolus administration of intravenous contrast. Multiplanar CT image reconstructions and MIPs were obtained to evaluate the vascular anatomy. CONTRAST:  68m OMNIPAQUE IOHEXOL 350 MG/ML SOLN  COMPARISON:  CT angiogram chest June 15, 2020; chest radiograph October 07, 2020 FINDINGS: Cardiovascular: There is no evident pulmonary embolus. No appreciable thoracic aortic aneurysm or dissection. Visualized great vessels appear unremarkable. There is no appreciable pericardial effusion or pericardial thickening. Port-A-Cath tip is in the superior vena cava near the cavoatrial junction. Mediastinum/Nodes: Thyroid appears normal. There are multiple axillary lymph nodes bilaterally, more severe on the right than on the left. The largest lymph node is seen in the right axillary region measuring 2.7 x 2.1 cm. There is adenopathy in the right supraclavicular and infraclavicular regions  with extension of adenopathy into the right Peri carinal region. Largest individual lymph node in these areas measures 2.6 x 1.8 cm. Several left supraclavicular lymph nodes are also noted, largest measuring 1.7 x 1.3 cm. There are several subcentimeter mediastinal lymph nodes. There is extensive retrocrural adenopathy. The largest lymph node or collection of matted lymph nodes is seen inferior to the aorta on the left posteriorly measuring 3.4 x 2.1 cm. No esophageal lesions are evident. Lungs/Pleura: There is extensive airspace consolidation in portions of the right middle and lower lobes with interspersed areas of loculated pleural effusion. There is ill-defined airspace opacity consistent with pneumonia in the left upper lobe. Loculated fluid tracks along the left major fissure. There may well be associated metastasis within the fissure given the extensive nodularity in this area. There is a small left pleural effusion with left base atelectasis. Pleural metastases noted along each upper hemithorax, primarily posteriorly as well as in the right apex. Upper Abdomen: Retrocrural adenopathy extends into the upper abdominal region. Visualized upper abdominal structures otherwise appear unremarkable. Musculoskeletal: There is a  large mass occupying the right breast and chest wall region with invasion of the deep muscles of the chest wall on the right. There is also invasion of the right axilla. There is extensive subcutaneous thickening in both breast regions. There is a mass in the lateral left breast with extension of soft tissue prominence along the lateral left hemithorax, likely due to extension of neoplasm into the adjacent soft tissues. There is widespread sclerotic bony metastatic disease throughout the thoracic region. Review of the MIP images confirms the above findings. IMPRESSION: 1. No demonstrable pulmonary embolus. No thoracic aortic aneurysm or dissection. 2. Areas of airspace consolidation noted in the right middle and lower lobes. More ill-defined infiltrate is noted in the left upper lobe anteriorly. Areas of loculated fluid noted in the left major fissure. Nodularity in this area could indicate superimposed masses in left major fissure. There is pleural thickening along both posterior upper hemithorax regions, likely pleural metastases. Loculated fluid noted in areas of infiltrate on the right. Small left pleural effusion noted. 3. Adenopathy at multiple sites, most severe in the retrocrural regions bilaterally, right supraclavicular region, and right axillary region, although there is left axillary and left supraclavicular adenopathy. 4. Large mass lesions occupying the right breast with extension and invasion of the chest wall and right axillary regions. A lesser degree of metastatic disease to the left of midline in the subcutaneous tissues and lateral left breast/lateral left chest wall soft tissues noted. 5. Multifocal sclerotic bony metastatic disease throughout the thoracic region noted. Electronically Signed   By: Lowella Grip III M.D.   On: 10/07/2020 13:23   US Venous Img Upper Uni Right(DVT)  Result Date: 10/08/2020 CLINICAL DATA:  History of stage IV breast cancer now with right upper extremity pain  and edema. Evaluate for DVT. EXAM: RIGHT UPPER EXTREMITY VENOUS DOPPLER ULTRASOUND TECHNIQUE: Gray-scale sonography with graded compression, as well as color Doppler and duplex ultrasound were performed to evaluate the upper extremity deep venous system from the level of the subclavian vein and including the jugular, axillary, basilic, radial, ulnar and upper cephalic vein. Spectral Doppler was utilized to evaluate flow at rest and with distal augmentation maneuvers. COMPARISON:  None. FINDINGS: Contralateral Subclavian Vein: Respiratory phasicity is normal and symmetric with the symptomatic side. No evidence of thrombus. Normal compressibility. Internal Jugular Vein: No evidence of thrombus. Normal compressibility, respiratory phasicity and response to augmentation. Subclavian Vein: No  evidence of thrombus. Normal compressibility, respiratory phasicity and response to augmentation. Axillary Vein: Appears patent where imaged though evaluation degraded secondary to patient's discomfort with sonographic evaluation of the axilla. Cephalic Vein: No evidence of thrombus. Normal compressibility, respiratory phasicity and response to augmentation. Basilic Vein: No evidence of thrombus. Normal compressibility, respiratory phasicity and response to augmentation. Brachial Veins: No evidence of thrombus within either of the paired brachial veins. Normal compressibility, respiratory phasicity and response to augmentation. Radial Veins: No evidence of thrombus. Normal compressibility, respiratory phasicity and response to augmentation. Ulnar Veins: No evidence of thrombus. Normal compressibility, respiratory phasicity and response to augmentation. Venous Reflux:  None visualized. Other Findings: There is a moderate amount of subcutaneous edema at the level of the right upper arm. IMPRESSION: No evidence of DVT within the right upper extremity. Electronically Signed   By: Sandi Mariscal M.D.   On: 10/08/2020 14:29   ECHOCARDIOGRAM  COMPLETE  Result Date: 10/10/2020    ECHOCARDIOGRAM REPORT   Patient Name:   QUORRA ROSENE Date of Exam: 10/10/2020 Medical Rec #:  [id number]           Height:       70.0 in Accession #:    [id number]          Weight:       178.7 lb Date of Birth:  [birth year]           BSA:          1.990 m Patient Age:    48 years            BP:           119/79 mmHg Patient Gender: F                   HR:           105 bpm. Exam Location:  [facility] Procedure: 2D Echo, Limited Color Doppler and Cardiac Doppler Indications:     R06.03 Acute Respiratory Distress  History:         Patient has no prior history of Echocardiogram examinations.                  Signs/Symptoms:Shortness of Breath. Breast cancer.  Sonographer:     Charmayne Sheer RDCS (AE) Referring Phys:  [id number] Sharen Hones Diagnosing Phys: Kathlyn Sacramento MD  Sonographer Comments: Technically challenging study due to limited acoustic windows. IMPRESSIONS  1. Left ventricular ejection fraction, by estimation, is 50 to 55%. The left ventricle has low normal function. Left ventricular endocardial border not optimally defined to evaluate regional wall motion. Left ventricular diastolic parameters are indeterminate.  2. Right ventricular systolic function is normal. The right ventricular size is normal. Tricuspid regurgitation signal is inadequate for assessing PA pressure.  3. The mitral valve is normal in structure. No evidence of mitral valve regurgitation. No evidence of mitral stenosis.  4. The aortic valve is normal in structure. Aortic valve regurgitation is not visualized. No aortic stenosis is present.  5. The inferior vena cava is normal in size with greater than 50% respiratory variability, suggesting right atrial pressure of 3 mmHg. FINDINGS  Left Ventricle: Left ventricular ejection fraction, by estimation, is 50 to 55%. The left ventricle has low normal function. Left ventricular endocardial border not optimally defined to evaluate regional wall motion. The left  ventricular internal cavity  size was normal in size. There is no left ventricular hypertrophy. Left ventricular diastolic parameters are indeterminate. Right Ventricle: The right ventricular  size is normal. No increase in right ventricular wall thickness. Right ventricular systolic function is normal. Tricuspid regurgitation signal is inadequate for assessing PA pressure. Left Atrium: Left atrial size was normal in size. Right Atrium: Right atrial size was normal in size. Pericardium: There is no evidence of pericardial effusion. Mitral Valve: The mitral valve is normal in structure. No evidence of mitral valve regurgitation. No evidence of mitral valve stenosis. MV peak gradient, 4.5 mmHg. The mean mitral valve gradient is 2.0 mmHg. Tricuspid Valve: The tricuspid valve is normal in structure. Tricuspid valve regurgitation is not demonstrated. No evidence of tricuspid stenosis. Aortic Valve: The aortic valve is normal in structure. Aortic valve regurgitation is not visualized. No aortic stenosis is present. Aortic valve mean gradient measures 4.0 mmHg. Aortic valve peak gradient measures 6.8 mmHg. Pulmonic Valve: The pulmonic valve was not well visualized. Pulmonic valve regurgitation is not visualized. No evidence of pulmonic stenosis. Aorta: The aortic root is normal in size and structure. Venous: The inferior vena cava is normal in size with greater than 50% respiratory variability, suggesting right atrial pressure of 3 mmHg. IAS/Shunts: No atrial level shunt detected by color flow Doppler.   LV Volumes (MOD) LV vol d, MOD A4C: 99.4 ml Diastology LV vol s, MOD A4C: 58.3 ml LV e' lateral:   10.30 cm/s LV SV MOD A4C:     99.4 ml LV E/e' lateral: 5.8  LEFT ATRIUM           Index LA Vol (A4C): 41.5 ml 20.86 ml/m  AORTIC VALVE AV Vmax:           130.00 cm/s AV Vmean:          93.400 cm/s AV VTI:            0.243 m AV Peak Grad:      6.8 mmHg AV Mean Grad:      4.0 mmHg LVOT Vmax:         92.70 cm/s LVOT Vmean:         60.500 cm/s LVOT VTI:          0.151 m LVOT/AV VTI ratio: 0.62 MITRAL VALVE MV Area (PHT): 6.96 cm    SHUNTS MV Peak grad:  4.5 mmHg    Systemic VTI: 0.15 m MV Mean grad:  2.0 mmHg MV Vmax:       1.06 m/s MV Vmean:      67.9 cm/s MV Decel Time: 109 msec MV E velocity: 59.60 cm/s MV A velocity: 68.60 cm/s MV E/A ratio:  0.87 Kathlyn Sacramento MD Electronically signed by Kathlyn Sacramento MD Signature Date/Time: 10/10/2020/2:00:06 PM    Final       ASSESSMENT/PLAN   Malignant pleural effusion    - since patient had initial therapeutic response to thoracentesis she will likely benefit from permament tunneled pleural catheter via IR service as palliative option for malignant effusion due to high probability of rapid re-accumulation    -there is no renal failure, hepatic synthetic function within reference range without HASH/cirhosis, no CHF per most recent TTE   - no lymphadema  Metastatic lung cancer   -Currently being followed by oncology Dr Tasia Catchings - appreciate input     Compressive atelectasis   Worse at Right lowe lobe - patient is weak and unlikely to be able to perform well with incentive spirometry.  She is a good candidate for albuterol infused Metaneb therapy can do BID to help with recruitment.     Chronic deconditioning and  severe protein calorie malnutrition     -nutritional and PT evaluation     -palliative care on case - appreciate input     -bitemporal wasting noted on examination     - high risk refeeding syndrome - monitor electorlytes - will place consult for pharmD and RD   Acute hypoxemic respiratory failure    - patient generally not on supplemental O2 currently on 7L/min supplemental O2    - she has leukocytosis but gram stain is negative on pleural fluid and there is no obvious infiltrate although basis bilaterally may have pneumonia, this is obscured by effusion and atelectatic sement.  Legionella and strep pneumo ag is negative. I feel that CAP is less likely.  Have ordered  iflammatory markers and PCT.     - plan to start steroids to help with inflammatory component of this pneumonia     - lasix to help with residual effusion     -Soluedrol 20 bid IV     - lasix 40 iv daily      - albumin 30m 25% 1 amd daily with diuresis     - chest physitherapy to recruit atelectatic lungs     - respiratory viral panel to rule out non covid viral etiology      -etiology is all of the above conditions     - TTE with no findings to suggest CHF realted hypoxemia   Thank you for allowing me to participate in the care of this patient.   Patient/Family are satisfied with care plan and all questions have been answered.  This document was prepared using Dragon voice recognition software and may include unintentional dictation errors.     FOttie Glazier M.D.  Division of [facility]

## 2020-10-13 NOTE — Progress Notes (Signed)
Date of Admission:  10/07/2020      ID: Jessica Willis is a 55 y.o. female Principal Problem:   HCAP (healthcare-associated pneumonia) Active Problems:   Sepsis ([place])   Acute respiratory failure with hypoxia ([place])   Metastatic breast cancer ([facility])   Tachycardia   Iron deficiency anemia    Subjective: Patient states her breathing is better since she was started on steroids.   Medications:  . albuterol  2.5 mg Nebulization BID  . calcium carbonate  1,250 mg Oral Q breakfast  . Chlorhexidine Gluconate Cloth  6 each Topical Daily  . senna  2 tablet Oral BID   And  . docusate sodium  100 mg Oral BID  . enoxaparin (LOVENOX) injection  40 mg Subcutaneous Q24H  . feeding supplement  237 mL Oral TID BM  . ferrous sulfate  325 mg Oral BID WC  . furosemide  40 mg Intravenous Daily  . magnesium chloride  1 tablet Oral Daily  . melatonin  5 mg Oral QHS  . methylPREDNISolone (SOLU-MEDROL) injection  20 mg Intravenous Q12H  . metoprolol tartrate  50 mg Oral BID  . sodium chloride flush  10-40 mL Intracatheter Q12H    Objective: Vital signs in last 24 hours: Temp:  [97.6 F (36.4 C)-98.8 F (37.1 C)] 97.6 F (36.4 C) (03/03 1106) Pulse Rate:  [106-124] 106 (03/03 1129) Resp:  [16-20] 17 (03/03 1106) BP: (117-127)/(63-77) 127/77 (03/03 1106) SpO2:  [92 %-96 %] 94 % (03/03 1106) Weight:  [81.4 kg] 81.4 kg (03/03 0500)  PHYSICAL EXAM:  General: Alert, cooperative, no distress, Lungs: Decreased air entry on the right side. Heart: Regular rate and rhythm, no murmur, rub or gallop. Abdomen: Soft, non-tender,not distended. Bowel sounds normal. No masses Extremities: atraumatic, no cyanosis. No edema. No clubbing Port on the left side of the chest wall Skin: No rashes or lesions. Or bruising Lymph: Cervical, supraclavicular normal. Neurologic: Grossly non-focal      Lab Results Recent Labs    10/11/20 0514 10/12/20 0711 10/13/20 0647  WBC 21.7* 23.0*  --   HGB  10.4* 10.5*  --   HCT 33.5* 34.1*  --   NA 143 142 142  K 4.0 4.0 4.0  CL 105 103 101  CO2 29 29 31   BUN 18 23* 21*  CREATININE 0.35* 0.49 0.39*   Liver Panel No results for input(s): PROT, ALBUMIN, AST, ALT, ALKPHOS, BILITOT, BILIDIR, IBILI in the last 72 hours. Sedimentation Rate Recent Labs    10/12/20 1408  ESRSEDRATE 44*   C-Reactive Protein Recent Labs    10/12/20 1408  CRP 16.0*    Microbiology:  Studies/Results: DG Chest Port 1 View  Result Date: 10/13/2020 CLINICAL DATA:  Pleural effusion EXAM: PORTABLE CHEST 1 VIEW COMPARISON:  10/07/2020 FINDINGS: Unchanged positioning of left-sided Port-A-Cath. Stable cardiomediastinal contours. Persistent small right pleural effusion. Slight progression of airspace opacity throughout the right lung most pronounced within the right lung base. Similar interstitial opacities throughout the left lung. No pneumothorax. IMPRESSION: 1. Slight progression of airspace opacity throughout the right lung, most pronounced within the right lung base. 2. Persistent small right pleural effusion. Electronically Signed   By: Davina Poke D.O.   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  Admitted with shortness of breath. Has extensive airspace consolidation on the right lung with loculated pleural effusion.  There is likely obstructive pneumonia from the malignancy.  She did not have any response to IV cefepime.  The procalcitonin is not a reliable test and malignancy and can be high.  Leukocytosis is also a likely response to the necrotizing fungating tumor of the right breast. Currently on Unasyn Seen by pulmonologist.  Started on steroids. Patient is currently on Unasyn.  Can be changed to Augmentin to complete 2 weeks course..  Advanced breast carcinoma with mets to the lung and cervical  spine. The cancer is fungating necrotic mass which is infiltrating the right chest wall.  Anemia  Patient is followed by oncology, palliative care and pulmonologist.  Discussed the management with the patient. I will sign off.  Call if needed

## 2020-10-13 NOTE — Progress Notes (Signed)
ON PATHWAY REGIMEN - Breast  No Change  Continue With Treatment as Ordered.  Original Decision Date/Time: 06/27/2020 12:31     A cycle is every 28 days (3 weeks on and 1 week off):     Paclitaxel   **Always confirm dose/schedule in your pharmacy ordering system**  Patient Characteristics: Distant Metastases or Locoregional Recurrent Disease - Unresected or Locally Advanced Unresectable Disease Progressing after Neoadjuvant and Local Therapies, HER2 Negative/Unknown/Equivocal, ER Negative/Unknown, Chemotherapy, First Line, ER Negative,  PD-L1 Expression Negative/Unknown Therapeutic Status: Distant Metastases ER Status: Negative (-) HER2 Status: Negative (-) PR Status: Negative (-) Therapy Approach Indicated: Standard Chemotherapy/Endocrine Therapy Line of Therapy: First Line PD-L1 Expression Status: Awaiting Test Results Intent of Therapy: Non-Curative / Palliative Intent, Discussed with Patient

## 2020-10-13 NOTE — Plan of Care (Signed)
PT resting in bed comfortably. She denies any pain Assessment completed as charted. Pt refused dressing change. Multiple attempts made but patient wanted to wait until after her son left the bedside. Will handoff to night RN Attempted to wean patient O2. Patient dsat to 82% on 4L. Patient complaining of SOB. Increased to 6L [place] sats improved to 91%  Tele monitor discontinued.  Pt verbalizes an understanding on plan of care.  Denies any additional wants or needs at this time Call bell remains within reach.  Will continue to closely monitor.    Problem: Education: Goal: Knowledge of General Education information will improve Description: Including pain rating scale, medication(s)/side effects and non-pharmacologic comfort measures Outcome: Progressing   Problem: Health Behavior/Discharge Planning: Goal: Ability to manage health-related needs will improve Outcome: Progressing   Problem: Clinical Measurements: Goal: Ability to maintain clinical measurements within normal limits will improve Outcome: Progressing Goal: Will remain free from infection Outcome: Progressing Goal: Diagnostic test results will improve Outcome: Progressing Goal: Respiratory complications will improve Outcome: Progressing Goal: Cardiovascular complication will be avoided Outcome: Progressing   Problem: Activity: Goal: Risk for activity intolerance will decrease Outcome: Progressing   Problem: Nutrition: Goal: Adequate nutrition will be maintained Outcome: Progressing   Problem: Coping: Goal: Level of anxiety will decrease Outcome: Progressing   Problem: Elimination: Goal: Will not experience complications related to bowel motility Outcome: Progressing Goal: Will not experience complications related to urinary retention Outcome: Progressing   Problem: Pain Managment: Goal: General experience of comfort will improve Outcome: Progressing   Problem: Safety: Goal: Ability to remain free from  injury will improve Outcome: Progressing   Problem: Skin Integrity: Goal: Risk for impaired skin integrity will decrease Outcome: Progressing

## 2020-10-13 NOTE — Progress Notes (Signed)
Hematology/Oncology Progress Note [facility] [phone] [phone]  Patient Care Team: [place] as PCP - General Rico Junker, RN as Registered Nurse Benjamine Sprague, DO as Consulting Physician (Surgery)   Name of the patient: Jessica Willis  [id number]  [birth year]  Date of visit: 10/13/20   INTERVAL HISTORY-  Patient was seen by pulmonology Dr. Lanney Gins yesterday.  Was started on steroids and Lasix. patient reports breathing has improved today.  Oxygen weaned down to 4 L nasal cannula.   Son Jessica Willis is at the bedside.   Review of systems- Review of Systems  Constitutional: Positive for appetite change, fatigue and unexpected weight change. Negative for chills and fever.  HENT:   Negative for hearing loss and voice change.   Eyes: Negative for eye problems.  Respiratory: Positive for shortness of breath. Negative for chest tightness and cough.   Cardiovascular: Negative for chest pain.  Gastrointestinal: Negative for abdominal distention, abdominal pain and blood in stool.  Endocrine: Negative for hot flashes.  Genitourinary: Negative for difficulty urinating and frequency.   Musculoskeletal: Negative for arthralgias.  Skin: Negative for itching and rash.  Neurological: Negative for extremity weakness.  Hematological: Negative for adenopathy.  Psychiatric/Behavioral: Negative for confusion.    No Known Allergies  Patient Active Problem List   Diagnosis Date Noted  . HCAP (healthcare-associated pneumonia) 10/07/2020  . QT prolongation 09/22/2020  . Pneumonia due to Streptococcus pyogenes ([place]) 08/22/2020  . Genetic testing 07/27/2020  . Pancreatic lesion 07/22/2020  . Swelling of lower leg 07/13/2020  . Encounter for antineoplastic chemotherapy 07/04/2020  . Complication of chemotherapy 07/04/2020  . Family history of breast cancer   . Port-A-Cath in place   . Bone metastasis ([place])   .  Hypocalcemia   . Folate deficiency   . Gastroesophageal reflux disease without esophagitis   . Weakness of right arm   . Palliative care encounter   . Iron deficiency anemia   . Anemia, chronic disease   . Tachycardia   . Metastatic breast cancer ([place])   . Malignant pleural effusion   . Chest tube in place   . Goals of care, counseling/discussion   . Hemothorax on right 06/16/2020  . Acute respiratory failure with hypoxia ([place])   . Breast mass, right 06/15/2020  . Mastitis 06/15/2020  . Pleural effusion 06/15/2020  . Intestinal obstruction ([place])   . Leukocytosis   . Hypokalemia 06/24/2015  . Sepsis ([place]) 06/23/2015  . CAP (community acquired pneumonia) 06/23/2015  . Partial small bowel obstruction ([place]) 06/23/2015     Past Medical History:  Diagnosis Date  . Anemia   . Family history of breast cancer   . Patient denies medical problems      Past Surgical History:  Procedure Laterality Date  . BREAST BIOPSY  06/28/2020   Procedure: BREAST BIOPSY;  Surgeon: Benjamine Sprague, DO;  Location: [facility] ORS;  Service: General;;  . PORTACATH PLACEMENT N/A 06/28/2020   Procedure: INSERTION PORT-A-CATH;  Surgeon: Benjamine Sprague, DO;  Location: [facility] ORS;  Service: General;  Laterality: N/A;  . TUBAL LIGATION    . VIDEO ASSISTED THORACOSCOPY (VATS)/THOROCOTOMY Right 06/23/2020   Procedure: ATTEMPTED VIDEO ASSISTED THORACOSCOPY (VATS);  Surgeon: Nestor Lewandowsky, MD;  Location: [facility] ORS;  Service: General;  Laterality: Right;    Social History   Socioeconomic History  . Marital status: Single    Spouse name: Not on file  . Number of children: Not on file  . Years of education:  Not on file  . Highest education level: Not on file  Occupational History  . Not on file  Tobacco Use  . Smoking status: Never Smoker  . Smokeless tobacco: Never Used  Vaping Use  . Vaping Use: Never used  Substance and Sexual Activity  . Alcohol use: Not Currently    Alcohol/week: 1.0 standard drink     Types: 1 Cans of beer per week  . Drug use: No  . Sexual activity: Not on file  Other Topics Concern  . Not on file  Social History Narrative  . Not on file   Social Determinants of Health   Financial Resource Strain: Not on file  Food Insecurity: Not on file  Transportation Needs: Not on file  Physical Activity: Not on file  Stress: Not on file  Social Connections: Not on file  Intimate Partner Violence: Not on file     Family History  Problem Relation Age of Onset  . Diabetes Other   . Hypertension Other   . Diabetes Maternal Aunt   . Breast cancer Cousin        dx 29s  . Breast cancer Cousin        dx 11s     Current Facility-Administered Medications:  .  acetaminophen (TYLENOL) tablet 650 mg, 650 mg, Oral, Q6H PRN, Ivor Costa, MD, 650 mg at 10/09/20 2144 .  albuterol (PROVENTIL) (2.5 MG/3ML) 0.083% nebulizer solution 2.5 mg, 2.5 mg, Nebulization, BID, Lanney Gins, Fuad, MD, 2.5 mg at 10/13/20 0834 .  albuterol (VENTOLIN HFA) 108 (90 Base) MCG/ACT inhaler 2 puff, 2 puff, Inhalation, Q4H PRN, Ivor Costa, MD .  Ampicillin-Sulbactam (UNASYN) 3 g in sodium chloride 0.9 % 100 mL IVPB, 3 g, Intravenous, Q6H, Ravishankar, Jayashree, MD, Last Rate: 200 mL/hr at 10/13/20 1707, 3 g at 10/13/20 1707 .  calcium carbonate (OS-CAL - dosed in mg of elemental calcium) tablet 1,250 mg, 1,250 mg, Oral, Q breakfast, Ivor Costa, MD, 1,250 mg at 10/13/20 1404 .  Chlorhexidine Gluconate Cloth 2 % PADS 6 each, 6 each, Topical, Daily, Sharen Hones, MD, 6 each at 10/13/20 1001 .  dextromethorphan-guaiFENesin ([place] DM) 30-600 MG per 12 hr tablet 1 tablet, 1 tablet, Oral, BID PRN, Ivor Costa, MD .  senna (SENOKOT) tablet 17.2 mg, 2 tablet, Oral, BID, 17.2 mg at 10/13/20 1404 **AND** docusate sodium (COLACE) capsule 100 mg, 100 mg, Oral, BID, Samuella Cota, MD, 100 mg at 10/13/20 1404 .  enoxaparin (LOVENOX) injection 40 mg, 40 mg, Subcutaneous, Q24H, Ivor Costa, MD, 40 mg at 10/12/20 2152 .   feeding supplement (ENSURE ENLIVE / ENSURE PLUS) liquid 237 mL, 237 mL, Oral, TID BM, Sharen Hones, MD, 237 mL at 10/13/20 1405 .  ferrous sulfate tablet 325 mg, 325 mg, Oral, BID WC, Ivor Costa, MD, 325 mg at 10/13/20 1705 .  furosemide (LASIX) injection 40 mg, 40 mg, Intravenous, Daily, Lanney Gins, Fuad, MD, 40 mg at 10/13/20 1001 .  lidocaine-prilocaine (EMLA) cream 1 application, 1 application, Topical, PRN, Ivor Costa, MD .  magnesium chloride (SLOW-MAG) 64 MG SR tablet 64 mg, 1 tablet, Oral, Daily, Ivor Costa, MD, 64 mg at 10/13/20 1001 .  melatonin tablet 5 mg, 5 mg, Oral, QHS, Sharen Hones, MD, 5 mg at 10/12/20 2151 .  methylPREDNISolone sodium succinate (SOLU-MEDROL) 40 mg/mL injection 20 mg, 20 mg, Intravenous, Q12H, Lanney Gins, Fuad, MD, 20 mg at 10/13/20 1405 .  metoprolol tartrate (LOPRESSOR) tablet 50 mg, 50 mg, Oral, BID, Ivor Costa, MD, 50 mg at 10/13/20  1001 .  [START ON 10/14/2020] multivitamin with minerals tablet 1 tablet, 1 tablet, Oral, Daily, Samuella Cota, MD .  ondansetron [facility]) injection 4 mg, 4 mg, Intravenous, Q8H PRN, Ivor Costa, MD .  sodium chloride (OCEAN) 0.65 % nasal spray 1 spray, 1 spray, Each Nare, PRN, Samuella Cota, MD .  sodium chloride flush (NS) 0.9 % injection 10-40 mL, 10-40 mL, Intracatheter, Q12H, Sharen Hones, MD, 10 mL at 10/13/20 1002 .  sodium chloride flush (NS) 0.9 % injection 10-40 mL, 10-40 mL, Intracatheter, PRN, Sharen Hones, MD, 10 mL at 10/10/20 1447   Physical exam:  Vitals:   10/13/20 0834 10/13/20 1106 10/13/20 1129 10/13/20 1528  BP:  127/77  110/68  Pulse:  (!) 124 (!) 106 (!) 102  Resp:  17  18  Temp:  97.6 F (36.4 C)  98 F (36.7 C)  TempSrc:  Oral  Oral  SpO2: 92% 94%  96%  Weight:      Height:       Physical Exam Constitutional:      General: She is not in acute distress.    Appearance: She is not diaphoretic.  HENT:     Head: Normocephalic and atraumatic.     Nose: Nose normal.     Mouth/Throat:      Pharynx: No oropharyngeal exudate.  Eyes:     General: No scleral icterus.    Pupils: Pupils are equal, round, and reactive to light.  Cardiovascular:     Rate and Rhythm: Normal rate and regular rhythm.     Heart sounds: No murmur heard.   Pulmonary:     Effort: Pulmonary effort is normal. No respiratory distress.     Comments: Decreased right lower base breath sound Rhonchi Abdominal:     General: There is no distension.     Palpations: Abdomen is soft.     Tenderness: There is no abdominal tenderness.  Musculoskeletal:        General: Normal range of motion.     Cervical back: Normal range of motion and neck supple.  Skin:    General: Skin is warm and dry.     Findings: No erythema.  Neurological:     Mental Status: She is alert and oriented to person, place, and time.     Cranial Nerves: No cranial nerve deficit.     Motor: No abnormal muscle tone.     Coordination: Coordination normal.  Psychiatric:        Mood and Affect: Affect normal.   Declined breast examination.    CMP Latest Ref Rng & Units 10/13/2020  Glucose 70 - 99 mg/dL 109(H)  BUN 6 - 20 mg/dL 21(H)  Creatinine 0.44 - 1.00 mg/dL 0.39(L)  Sodium 135 - 145 mmol/L 142  Potassium 3.5 - 5.1 mmol/L 4.0  Chloride 98 - 111 mmol/L 101  CO2 22 - 32 mmol/L 31  Calcium 8.9 - 10.3 mg/dL 8.1(L)  Total Protein 6.5 - 8.1 g/dL -  Total Bilirubin 0.3 - 1.2 mg/dL -  Alkaline Phos 38 - 126 U/L -  AST 15 - 41 U/L -  ALT 0 - 44 U/L -   CBC Latest Ref Rng & Units 10/12/2020  WBC 4.0 - 10.5 K/uL 23.0(H)  Hemoglobin 12.0 - 15.0 g/dL 10.5(L)  Hematocrit 36.0 - 46.0 % 34.1(L)  Platelets 150 - 400 K/uL 347    RADIOGRAPHIC STUDIES: I have personally reviewed the radiological images as listed and agreed with the findings in the  report. DG Chest 2 View  Result Date: 10/07/2020 CLINICAL DATA:  Hypoxia, shortness of breath, metastatic breast cancer EXAM: CHEST - 2 VIEW COMPARISON:  09/13/2020 FINDINGS: LEFT subclavian  Port-A-Cath with tip projecting over RIGHT atrium. Enlargement of cardiac silhouette. Pulmonary vascular congestion. Persistent enlargement of RIGHT hilum question mass or adenopathy. Increased infiltrate of the mid to lower RIGHT lung. Small RIGHT pleural effusion again seen. Accentuation of markings in LEFT perihilar region little changed. No pneumothorax or acute osseous findings. IMPRESSION: Increased RIGHT perihilar infiltrate with persistent small RIGHT pleural effusion. Enlargement of RIGHT pulmonary hilum by adenopathy versus perihilar mass. Electronically Signed   By: Lavonia Dana M.D.   On: 10/07/2020 11:32   CT Angio Chest PE W and/or Wo Contrast  Result Date: 10/07/2020 CLINICAL DATA:  Breast carcinoma. Tachycardia and shortness of breath EXAM: CT ANGIOGRAPHY CHEST WITH CONTRAST TECHNIQUE: Multidetector CT imaging of the chest was performed using the standard protocol during bolus administration of intravenous contrast. Multiplanar CT image reconstructions and MIPs were obtained to evaluate the vascular anatomy. CONTRAST:  46mL OMNIPAQUE IOHEXOL 350 MG/ML SOLN COMPARISON:  CT angiogram chest June 15, 2020; chest radiograph October 07, 2020 FINDINGS: Cardiovascular: There is no evident pulmonary embolus. No appreciable thoracic aortic aneurysm or dissection. Visualized great vessels appear unremarkable. There is no appreciable pericardial effusion or pericardial thickening. Port-A-Cath tip is in the superior vena cava near the cavoatrial junction. Mediastinum/Nodes: Thyroid appears normal. There are multiple axillary lymph nodes bilaterally, more severe on the right than on the left. The largest lymph node is seen in the right axillary region measuring 2.7 x 2.1 cm. There is adenopathy in the right supraclavicular and infraclavicular regions with extension of adenopathy into the right Peri carinal region. Largest individual lymph node in these areas measures 2.6 x 1.8 cm. Several left  supraclavicular lymph nodes are also noted, largest measuring 1.7 x 1.3 cm. There are several subcentimeter mediastinal lymph nodes. There is extensive retrocrural adenopathy. The largest lymph node or collection of matted lymph nodes is seen inferior to the aorta on the left posteriorly measuring 3.4 x 2.1 cm. No esophageal lesions are evident. Lungs/Pleura: There is extensive airspace consolidation in portions of the right middle and lower lobes with interspersed areas of loculated pleural effusion. There is ill-defined airspace opacity consistent with pneumonia in the left upper lobe. Loculated fluid tracks along the left major fissure. There may well be associated metastasis within the fissure given the extensive nodularity in this area. There is a small left pleural effusion with left base atelectasis. Pleural metastases noted along each upper hemithorax, primarily posteriorly as well as in the right apex. Upper Abdomen: Retrocrural adenopathy extends into the upper abdominal region. Visualized upper abdominal structures otherwise appear unremarkable. Musculoskeletal: There is a large mass occupying the right breast and chest wall region with invasion of the deep muscles of the chest wall on the right. There is also invasion of the right axilla. There is extensive subcutaneous thickening in both breast regions. There is a mass in the lateral left breast with extension of soft tissue prominence along the lateral left hemithorax, likely due to extension of neoplasm into the adjacent soft tissues. There is widespread sclerotic bony metastatic disease throughout the thoracic region. Review of the MIP images confirms the above findings. IMPRESSION: 1. No demonstrable pulmonary embolus. No thoracic aortic aneurysm or dissection. 2. Areas of airspace consolidation noted in the right middle and lower lobes. More ill-defined infiltrate is noted in the left upper  lobe anteriorly. Areas of loculated fluid noted in the  left major fissure. Nodularity in this area could indicate superimposed masses in left major fissure. There is pleural thickening along both posterior upper hemithorax regions, likely pleural metastases. Loculated fluid noted in areas of infiltrate on the right. Small left pleural effusion noted. 3. Adenopathy at multiple sites, most severe in the retrocrural regions bilaterally, right supraclavicular region, and right axillary region, although there is left axillary and left supraclavicular adenopathy. 4. Large mass lesions occupying the right breast with extension and invasion of the chest wall and right axillary regions. A lesser degree of metastatic disease to the left of midline in the subcutaneous tissues and lateral left breast/lateral left chest wall soft tissues noted. 5. Multifocal sclerotic bony metastatic disease throughout the thoracic region noted. Electronically Signed   By: Lowella Grip III M.D.   On: 10/07/2020 13:23   US Venous Img Upper Uni Right(DVT)  Result Date: 10/08/2020 CLINICAL DATA:  History of stage IV breast cancer now with right upper extremity pain and edema. Evaluate for DVT. EXAM: RIGHT UPPER EXTREMITY VENOUS DOPPLER ULTRASOUND TECHNIQUE: Gray-scale sonography with graded compression, as well as color Doppler and duplex ultrasound were performed to evaluate the upper extremity deep venous system from the level of the subclavian vein and including the jugular, axillary, basilic, radial, ulnar and upper cephalic vein. Spectral Doppler was utilized to evaluate flow at rest and with distal augmentation maneuvers. COMPARISON:  None. FINDINGS: Contralateral Subclavian Vein: Respiratory phasicity is normal and symmetric with the symptomatic side. No evidence of thrombus. Normal compressibility. Internal Jugular Vein: No evidence of thrombus. Normal compressibility, respiratory phasicity and response to augmentation. Subclavian Vein: No evidence of thrombus. Normal compressibility,  respiratory phasicity and response to augmentation. Axillary Vein: Appears patent where imaged though evaluation degraded secondary to patient's discomfort with sonographic evaluation of the axilla. Cephalic Vein: No evidence of thrombus. Normal compressibility, respiratory phasicity and response to augmentation. Basilic Vein: No evidence of thrombus. Normal compressibility, respiratory phasicity and response to augmentation. Brachial Veins: No evidence of thrombus within either of the paired brachial veins. Normal compressibility, respiratory phasicity and response to augmentation. Radial Veins: No evidence of thrombus. Normal compressibility, respiratory phasicity and response to augmentation. Ulnar Veins: No evidence of thrombus. Normal compressibility, respiratory phasicity and response to augmentation. Venous Reflux:  None visualized. Other Findings: There is a moderate amount of subcutaneous edema at the level of the right upper arm. IMPRESSION: No evidence of DVT within the right upper extremity. Electronically Signed   By: Sandi Mariscal M.D.   On: 10/08/2020 14:29   DG Chest Port 1 View  Result Date: 10/13/2020 CLINICAL DATA:  Pleural effusion EXAM: PORTABLE CHEST 1 VIEW COMPARISON:  10/07/2020 FINDINGS: Unchanged positioning of left-sided Port-A-Cath. Stable cardiomediastinal contours. Persistent small right pleural effusion. Slight progression of airspace opacity throughout the right lung most pronounced within the right lung base. Similar interstitial opacities throughout the left lung. No pneumothorax. IMPRESSION: 1. Slight progression of airspace opacity throughout the right lung, most pronounced within the right lung base. 2. Persistent small right pleural effusion. Electronically Signed   By: Davina Poke D.O.   On: 10/13/2020 14:10   ECHOCARDIOGRAM COMPLETE  Result Date: 10/10/2020    ECHOCARDIOGRAM REPORT   Patient Name:   PAULENA SERVAIS Date of Exam: 10/10/2020 Medical Rec #:   [id number]           Height:       70.0 in Accession #:  [id number]          Weight:       178.7 lb Date of Birth:  Mar 20, 1966           BSA:          1.990 m Patient Age:    21 years            BP:           119/79 mmHg Patient Gender: F                   HR:           105 bpm. Exam Location:  [facility] Procedure: 2D Echo, Limited Color Doppler and Cardiac Doppler Indications:     R06.03 Acute Respiratory Distress  History:         Patient has no prior history of Echocardiogram examinations.                  Signs/Symptoms:Shortness of Breath. Breast cancer.  Sonographer:     Charmayne Sheer RDCS (AE) Referring Phys:  [id number] Sharen Hones Diagnosing Phys: Kathlyn Sacramento MD  Sonographer Comments: Technically challenging study due to limited acoustic windows. IMPRESSIONS  1. Left ventricular ejection fraction, by estimation, is 50 to 55%. The left ventricle has low normal function. Left ventricular endocardial border not optimally defined to evaluate regional wall motion. Left ventricular diastolic parameters are indeterminate.  2. Right ventricular systolic function is normal. The right ventricular size is normal. Tricuspid regurgitation signal is inadequate for assessing PA pressure.  3. The mitral valve is normal in structure. No evidence of mitral valve regurgitation. No evidence of mitral stenosis.  4. The aortic valve is normal in structure. Aortic valve regurgitation is not visualized. No aortic stenosis is present.  5. The inferior vena cava is normal in size with greater than 50% respiratory variability, suggesting right atrial pressure of 3 mmHg. FINDINGS  Left Ventricle: Left ventricular ejection fraction, by estimation, is 50 to 55%. The left ventricle has low normal function. Left ventricular endocardial border not optimally defined to evaluate regional wall motion. The left ventricular internal cavity  size was normal in size. There is no left ventricular hypertrophy. Left ventricular diastolic parameters are  indeterminate. Right Ventricle: The right ventricular size is normal. No increase in right ventricular wall thickness. Right ventricular systolic function is normal. Tricuspid regurgitation signal is inadequate for assessing PA pressure. Left Atrium: Left atrial size was normal in size. Right Atrium: Right atrial size was normal in size. Pericardium: There is no evidence of pericardial effusion. Mitral Valve: The mitral valve is normal in structure. No evidence of mitral valve regurgitation. No evidence of mitral valve stenosis. MV peak gradient, 4.5 mmHg. The mean mitral valve gradient is 2.0 mmHg. Tricuspid Valve: The tricuspid valve is normal in structure. Tricuspid valve regurgitation is not demonstrated. No evidence of tricuspid stenosis. Aortic Valve: The aortic valve is normal in structure. Aortic valve regurgitation is not visualized. No aortic stenosis is present. Aortic valve mean gradient measures 4.0 mmHg. Aortic valve peak gradient measures 6.8 mmHg. Pulmonic Valve: The pulmonic valve was not well visualized. Pulmonic valve regurgitation is not visualized. No evidence of pulmonic stenosis. Aorta: The aortic root is normal in size and structure. Venous: The inferior vena cava is normal in size with greater than 50% respiratory variability, suggesting right atrial pressure of 3 mmHg. IAS/Shunts: No atrial level shunt detected by color flow Doppler.   LV Volumes (MOD) LV vol d,  MOD A4C: 99.4 ml Diastology LV vol s, MOD A4C: 58.3 ml LV e' lateral:   10.30 cm/s LV SV MOD A4C:     99.4 ml LV E/e' lateral: 5.8  LEFT ATRIUM           Index LA Vol (A4C): 41.5 ml 20.86 ml/m  AORTIC VALVE AV Vmax:           130.00 cm/s AV Vmean:          93.400 cm/s AV VTI:            0.243 m AV Peak Grad:      6.8 mmHg AV Mean Grad:      4.0 mmHg LVOT Vmax:         92.70 cm/s LVOT Vmean:        60.500 cm/s LVOT VTI:          0.151 m LVOT/AV VTI ratio: 0.62 MITRAL VALVE MV Area (PHT): 6.96 cm    SHUNTS MV Peak grad:  4.5 mmHg     Systemic VTI: 0.15 m MV Mean grad:  2.0 mmHg MV Vmax:       1.06 m/s MV Vmean:      67.9 cm/s MV Decel Time: 109 msec MV E velocity: 59.60 cm/s MV A velocity: 68.60 cm/s MV E/A ratio:  0.87 Kathlyn Sacramento MD Electronically signed by Kathlyn Sacramento MD Signature Date/Time: 10/10/2020/2:00:06 PM    Final     Assessment and plan-   # #Acute respiratory failure with hypoxia Continue Unasyn Pulmonology recommendation was noted. Continue Solu-Medrol and IV Lasix.  #Metastatic triple negative breast cancer, patient has high tumor burden, progressed on first-line palliative chemotherapy with Taxol.  There are other treatment options available, i.e. Adriamycin +/- Cytoxan, Sacituzumab govitecan.  Immunotherapy is less likely beneficial given her CPS <1. Patient is very sick with visceral crisis/obstructive pneumonia. CODE STATUS was discussed with patient.  She expresses wishes that she does not want to be intubated or resuscitated.   DNR order placed.  Patient's feels slightly better today.  She does not want to proceed with comfort care yet.  She is interested in trying additional chemo treatment hoping to improve.  Discussed with her about the option of single agent Adriamycin while she is inpatient.  She understands that response rate is about 30% and the treatment may further worsen breathing status, rapid decline of her current condition, etc. Son Jessica Willis is at bedside and updated. Patient declined my offer to call her daughter. She says she has informed her daughter about her decision.  I will see patient tomorrow morning if she remains stable, plan inpatient chemotherapy with Adriamycin 60 mg/m x 1. Discussed with cancer center pharmacist. CBC CMP in AM.   Thank you for allowing me to participate in the care of this patient. I sent secure chat message to Dr.Goodrich to update him.  Earlie Server, MD, PhD Hematology Oncology [facility] at [facility] Pager- [phone] 10/13/2020

## 2020-10-14 ENCOUNTER — Other Ambulatory Visit: Payer: Self-pay | Admitting: Oncology

## 2020-10-14 ENCOUNTER — Inpatient Hospital Stay: Payer: Medicaid Other | Attending: Oncology

## 2020-10-14 VITALS — BP 116/69 | HR 126 | Temp 96.6°F

## 2020-10-14 DIAGNOSIS — Z5111 Encounter for antineoplastic chemotherapy: Secondary | ICD-10-CM | POA: Diagnosis not present

## 2020-10-14 DIAGNOSIS — R000 Tachycardia, unspecified: Secondary | ICD-10-CM | POA: Insufficient documentation

## 2020-10-14 DIAGNOSIS — J9601 Acute respiratory failure with hypoxia: Secondary | ICD-10-CM | POA: Diagnosis not present

## 2020-10-14 DIAGNOSIS — J90 Pleural effusion, not elsewhere classified: Secondary | ICD-10-CM | POA: Diagnosis not present

## 2020-10-14 DIAGNOSIS — M7989 Other specified soft tissue disorders: Secondary | ICD-10-CM | POA: Insufficient documentation

## 2020-10-14 DIAGNOSIS — Z803 Family history of malignant neoplasm of breast: Secondary | ICD-10-CM | POA: Insufficient documentation

## 2020-10-14 DIAGNOSIS — J910 Malignant pleural effusion: Secondary | ICD-10-CM | POA: Insufficient documentation

## 2020-10-14 DIAGNOSIS — D509 Iron deficiency anemia, unspecified: Secondary | ICD-10-CM | POA: Insufficient documentation

## 2020-10-14 DIAGNOSIS — C7951 Secondary malignant neoplasm of bone: Secondary | ICD-10-CM | POA: Insufficient documentation

## 2020-10-14 DIAGNOSIS — Z171 Estrogen receptor negative status [ER-]: Secondary | ICD-10-CM | POA: Insufficient documentation

## 2020-10-14 DIAGNOSIS — Z8616 Personal history of COVID-19: Secondary | ICD-10-CM | POA: Insufficient documentation

## 2020-10-14 DIAGNOSIS — C50911 Malignant neoplasm of unspecified site of right female breast: Secondary | ICD-10-CM | POA: Insufficient documentation

## 2020-10-14 DIAGNOSIS — C50919 Malignant neoplasm of unspecified site of unspecified female breast: Secondary | ICD-10-CM | POA: Diagnosis not present

## 2020-10-14 DIAGNOSIS — J189 Pneumonia, unspecified organism: Secondary | ICD-10-CM | POA: Diagnosis not present

## 2020-10-14 DIAGNOSIS — K869 Disease of pancreas, unspecified: Secondary | ICD-10-CM | POA: Insufficient documentation

## 2020-10-14 LAB — CBC WITH DIFFERENTIAL/PLATELET
Abs Immature Granulocytes: 0.75 10*3/uL — ABNORMAL HIGH (ref 0.00–0.07)
Basophils Absolute: 0 10*3/uL (ref 0.0–0.1)
Basophils Relative: 0 %
Eosinophils Absolute: 0 10*3/uL (ref 0.0–0.5)
Eosinophils Relative: 0 %
HCT: 31.9 % — ABNORMAL LOW (ref 36.0–46.0)
Hemoglobin: 9.2 g/dL — ABNORMAL LOW (ref 12.0–15.0)
Immature Granulocytes: 3 %
Lymphocytes Relative: 6 %
Lymphs Abs: 1.4 10*3/uL (ref 0.7–4.0)
MCH: 24.7 pg — ABNORMAL LOW (ref 26.0–34.0)
MCHC: 28.8 g/dL — ABNORMAL LOW (ref 30.0–36.0)
MCV: 85.8 fL (ref 80.0–100.0)
Monocytes Absolute: 1.6 10*3/uL — ABNORMAL HIGH (ref 0.1–1.0)
Monocytes Relative: 7 %
Neutro Abs: 18.5 10*3/uL — ABNORMAL HIGH (ref 1.7–7.7)
Neutrophils Relative %: 84 %
Platelets: 364 10*3/uL (ref 150–400)
RBC: 3.72 MIL/uL — ABNORMAL LOW (ref 3.87–5.11)
RDW: 16.1 % — ABNORMAL HIGH (ref 11.5–15.5)
WBC: 22.3 10*3/uL — ABNORMAL HIGH (ref 4.0–10.5)
nRBC: 0 % (ref 0.0–0.2)

## 2020-10-14 LAB — COMPREHENSIVE METABOLIC PANEL
ALT: 16 U/L (ref 0–44)
AST: 55 U/L — ABNORMAL HIGH (ref 15–41)
Albumin: 2.3 g/dL — ABNORMAL LOW (ref 3.5–5.0)
Alkaline Phosphatase: 157 U/L — ABNORMAL HIGH (ref 38–126)
Anion gap: 8 (ref 5–15)
BUN: 23 mg/dL — ABNORMAL HIGH (ref 6–20)
CO2: 34 mmol/L — ABNORMAL HIGH (ref 22–32)
Calcium: 8.1 mg/dL — ABNORMAL LOW (ref 8.9–10.3)
Chloride: 101 mmol/L (ref 98–111)
Creatinine, Ser: 0.37 mg/dL — ABNORMAL LOW (ref 0.44–1.00)
GFR, Estimated: >60 mL/min (ref >60)
Glucose, Bld: 118 mg/dL — ABNORMAL HIGH (ref 70–99)
Potassium: 3.6 mmol/L (ref 3.5–5.1)
Sodium: 143 mmol/L (ref 135–145)
Total Bilirubin: 0.4 mg/dL (ref 0.3–1.2)
Total Protein: 6.1 g/dL — ABNORMAL LOW (ref 6.5–8.1)

## 2020-10-14 MED ORDER — SODIUM CHLORIDE 0.9 % IV SOLN
150.0000 mg | Freq: Once | INTRAVENOUS | Status: AC
  Administered 2020-10-14: 150 mg via INTRAVENOUS
  Filled 2020-10-14: qty 5

## 2020-10-14 MED ORDER — POTASSIUM CHLORIDE CRYS ER 20 MEQ PO TBCR
40.0000 meq | EXTENDED_RELEASE_TABLET | Freq: Once | ORAL | Status: AC
  Administered 2020-10-14: 40 meq via ORAL
  Filled 2020-10-14: qty 2

## 2020-10-14 MED ORDER — SODIUM CHLORIDE 0.9 % IV SOLN
Freq: Once | INTRAVENOUS | Status: AC
  Filled 2020-10-14: qty 250

## 2020-10-14 MED ORDER — PALONOSETRON HCL INJECTION 0.25 MG/5ML
0.2500 mg | Freq: Once | INTRAVENOUS | Status: AC
  Administered 2020-10-14: 0.25 mg via INTRAVENOUS

## 2020-10-14 MED ORDER — ALBUMIN HUMAN 25 % IV SOLN
12.5000 g | Freq: Every day | INTRAVENOUS | Status: DC
  Administered 2020-10-14: 12.5 g via INTRAVENOUS
  Filled 2020-10-14: qty 50

## 2020-10-14 MED ORDER — SIMETHICONE 80 MG PO CHEW
80.0000 mg | CHEWABLE_TABLET | Freq: Every day | ORAL | Status: DC | PRN
  Administered 2020-10-15: 80 mg via ORAL
  Filled 2020-10-14 (×2): qty 1

## 2020-10-14 MED ORDER — AMOXICILLIN-POT CLAVULANATE 875-125 MG PO TABS
1.0000 | ORAL_TABLET | Freq: Two times a day (BID) | ORAL | Status: DC
  Administered 2020-10-14 – 2020-10-19 (×10): 1 via ORAL
  Filled 2020-10-14 (×10): qty 1

## 2020-10-14 MED ORDER — DOXORUBICIN HCL CHEMO IV INJECTION 2 MG/ML
60.0000 mg/m2 | Freq: Once | INTRAVENOUS | Status: AC
  Administered 2020-10-14: 120 mg via INTRAVENOUS
  Filled 2020-10-14: qty 50

## 2020-10-14 MED ORDER — POTASSIUM CHLORIDE CRYS ER 20 MEQ PO TBCR
20.0000 meq | EXTENDED_RELEASE_TABLET | Freq: Every day | ORAL | Status: DC
  Administered 2020-10-15 – 2020-10-21 (×7): 20 meq via ORAL
  Filled 2020-10-14 (×7): qty 1

## 2020-10-14 MED ORDER — SODIUM CHLORIDE 0.9 % IV SOLN
10.0000 mg | Freq: Once | INTRAVENOUS | Status: AC
  Administered 2020-10-14: 10 mg via INTRAVENOUS
  Filled 2020-10-14: qty 10

## 2020-10-14 NOTE — Progress Notes (Signed)
PROGRESS NOTE  Jessica Willis TMH:[id number] DOB: 02/06/1966 DOA: 10/07/2020 PCP: [place]  Brief History   55 year old woman PMH stage IV metastatic breast cancer on chemotherapy, malignant pleural effusion, hemothorax presented with shortness of breath.  Admitted for acute hypoxic respiratory failure, sepsis thought secondary to pneumonia complicated by metastatic breast cancer. Seen by pulmonology, ID, oncology and PMT. Plan is to continue aggressive treatment.  A & P  Acute hypoxic respiratory failure secondary to pneumonia versus metastatic disease/lymphangitic spread of cancer progression.  Sepsis considered on admission.  Extensive airspace abnormalities of the right lung and loculated pleural effusion, infectious versus malignant. --Oxygen requirement decreased now to 4 L, appears clinically better. --Appreciate pulmonary involvement.  Continue antibiotics and respiratory treatments.  Stage IV metastatic fungating breast cancer with large necrotizing mass, high tumor burden, progressed on weekly Taxol last dose 09/15/2020 --Continue management as per oncology pending inpatient and outpatient chemotherapy being continued.  Fungating right breast mass with drainage --Continue wound care, goal directed towards observing drainage and decreasing discomfort.  Iron deficiency anemia  --Stable.  Chronic tachycardia --Continue beta-blocker  Goals of care --disease not curable, refractory to first-line chemotherapy, aggressive in nature.  Palliative care following.  Guarded prognosis.  Disposition Plan:  Discussion: will continue current care, prognosis appears guarded  Status is: Inpatient  Remains inpatient appropriate because:IV treatments appropriate due to intensity of illness or inability to take PO and Inpatient level of care appropriate due to severity of illness   Dispo: The patient is from: Home              Anticipated d/c is to: TBD               Patient currently is not medically stable to d/c.   Difficult to place patient No  DVT prophylaxis: enoxaparin (LOVENOX) injection 40 mg Start: 10/07/20 2200   Code Status: DNR Level of care: Progressive Cardiac Family Communication: none  Murray Hodgkins, MD  [facility] Direct contact: see [email or web address] (further directions at bottom of note if needed) 7PM-7AM contact night coverage as at bottom of note 10/14/2020, 5:27 PM  LOS: 7 days   Significant Hospital Events   .    Consults:  . Oncology . PMT . pulmonology   Procedures:  .   Significant Diagnostic Tests:  Marland Kitchen    Micro Data:  .    Antimicrobials:  .   Interval History/Subjective  CC: f/u SOB  Continues to feel better. Started chemotherapy today.  Objective   Vitals:  Vitals:   10/14/20 1251 10/14/20 1529  BP: 119/77 119/80  Pulse: (!) 105 (!) 105  Resp: 18 18  Temp: 98.1 F (36.7 C) 97.6 F (36.4 C)  SpO2: 91% 93%    Exam:  Constitutional:   . Appears calm and comfortable ENMT:  . grossly normal hearing  Respiratory:  . CTA bilaterally, no w/r/r.  . Respiratory effort normal.  . On 4L [place] Cardiovascular:  . RRR, no m/r/g Psychiatric:  . Mental status o Mood, affect appropriate  I have personally reviewed the following:   Today's Data  . Mild elevation AP, AST stable . CBC stable WBC and Hgb  Scheduled Meds: . albuterol  2.5 mg Nebulization BID  . amoxicillin-clavulanate  1 tablet Oral Q12H  . calcium carbonate  1,250 mg Oral Q breakfast  . Chlorhexidine Gluconate Cloth  6 each Topical Daily  . senna  2 tablet Oral BID   And  . docusate sodium  100  mg Oral BID  . enoxaparin (LOVENOX) injection  40 mg Subcutaneous Q24H  . feeding supplement  237 mL Oral TID BM  . ferrous sulfate  325 mg Oral BID WC  . furosemide  40 mg Intravenous Daily  . magnesium chloride  1 tablet Oral Daily  . melatonin  5 mg Oral QHS  . methylPREDNISolone (SOLU-MEDROL) injection  20 mg Intravenous Q12H   . metoprolol tartrate  50 mg Oral BID  . multivitamin with minerals  1 tablet Oral Daily  . [START ON 10/15/2020] potassium chloride  20 mEq Oral Daily  . sodium chloride flush  10-40 mL Intracatheter Q12H   Continuous Infusions: . albumin human      Principal Problem:   HCAP (healthcare-associated pneumonia) Active Problems:   Sepsis ([place])   Acute respiratory failure with hypoxia (HCC)   Metastatic breast cancer (HCC)   Tachycardia   Iron deficiency anemia   LOS: 7 days   How to contact the [facility] Attending or Consulting provider [place] or covering provider during after hours [place], for this patient?  1. Check the care team in [facility] and look for a) attending/consulting [facility] provider listed and b) the [facility] team listed 2. Log into [email or web address] and use 's universal password to access. If you do not have the password, please contact the hospital operator. 3. Locate the [facility] provider you are looking for under [facility] and page to a number that you can be directly reached. 4. If you still have difficulty reaching the provider, please page the [facility] (Director on Call) for the Hospitalists listed on amion for assistance.

## 2020-10-14 NOTE — Progress Notes (Signed)
Mobility Specialist - Progress Note   10/14/20 1300  Mobility  Activity Refused mobility  Mobility performed by Mobility specialist    Second mobility attempt this date. Pt politely declined session d/t just returning from chemo, requests mobility tech to return after lunch later this date. Will attempt as time permits.    Kathee Delton Mobility Specialist 10/14/20, 1:07 PM

## 2020-10-14 NOTE — Progress Notes (Signed)
PT Cancellation Note  Patient Details Name: Jessica Willis MRN: [id number] DOB: 05-Apr-1966   Cancelled Treatment:    Reason Eval/Treat Not Completed: Patient declined PT evaluation this date secondary to not feeling well after chemo session.  Will attempt to see pt at a future date/time as medically appropriate.     Linus Salmons PT, DPT 10/14/20, 2:24 PM

## 2020-10-14 NOTE — Progress Notes (Signed)
Pt arrived via wheelchair transport from inpatient. Accessed port- with good blood return. treatrment completed without incident. Port left accessed as flushes well with blood return. transport picked up pt and transported back to hospital via wheelchair.

## 2020-10-14 NOTE — Progress Notes (Signed)
Hematology/Oncology Progress Note [facility] [phone] [phone]  Patient Care Team: [place] as PCP - General Rico Junker, RN as Registered Nurse Benjamine Sprague, DO as Consulting Physician (Surgery)   Name of the patient: Jessica Willis  [id number]  02/18/1966  Date of visit: 10/14/20   INTERVAL HISTORY-  No overnight acute issue. Patient feels "ok, just gassy". No fever chills, chest pain, nausea vomiting diarrhea. Last bowel movement yesterday.    Review of systems- Review of Systems  Constitutional: Positive for appetite change, fatigue and unexpected weight change. Negative for chills and fever.  HENT:   Negative for hearing loss and voice change.   Eyes: Negative for eye problems.  Respiratory: Positive for shortness of breath. Negative for chest tightness and cough.   Cardiovascular: Negative for chest pain.  Gastrointestinal: Negative for abdominal distention, abdominal pain and blood in stool.  Endocrine: Negative for hot flashes.  Genitourinary: Negative for difficulty urinating and frequency.   Musculoskeletal: Negative for arthralgias.  Skin: Negative for itching and rash.  Neurological: Negative for extremity weakness.  Hematological: Negative for adenopathy.  Psychiatric/Behavioral: Negative for confusion.    No Known Allergies  Patient Active Problem List   Diagnosis Date Noted  . HCAP (healthcare-associated pneumonia) 10/07/2020  . QT prolongation 09/22/2020  . Pneumonia due to Streptococcus pyogenes ([place]) 08/22/2020  . Genetic testing 07/27/2020  . Pancreatic lesion 07/22/2020  . Swelling of lower leg 07/13/2020  . Encounter for antineoplastic chemotherapy 07/04/2020  . Complication of chemotherapy 07/04/2020  . Family history of breast cancer   . Port-A-Cath in place   . Bone metastasis ([place])   . Hypocalcemia   . Folate deficiency   . Gastroesophageal reflux disease  without esophagitis   . Weakness of right arm   . Palliative care encounter   . Iron deficiency anemia   . Anemia, chronic disease   . Tachycardia   . Metastatic breast cancer ([place])   . Malignant pleural effusion   . Chest tube in place   . Goals of care, counseling/discussion   . Hemothorax on right 06/16/2020  . Acute respiratory failure with hypoxia ([place])   . Breast mass, right 06/15/2020  . Mastitis 06/15/2020  . Pleural effusion 06/15/2020  . Intestinal obstruction ([place])   . Leukocytosis   . Hypokalemia 06/24/2015  . Sepsis ([place]) 06/23/2015  . CAP (community acquired pneumonia) 06/23/2015  . Partial small bowel obstruction ([place]) 06/23/2015     Past Medical History:  Diagnosis Date  . Anemia   . Family history of breast cancer   . Patient denies medical problems      Past Surgical History:  Procedure Laterality Date  . BREAST BIOPSY  06/28/2020   Procedure: BREAST BIOPSY;  Surgeon: Benjamine Sprague, DO;  Location: [facility] ORS;  Service: General;;  . PORTACATH PLACEMENT N/A 06/28/2020   Procedure: INSERTION PORT-A-CATH;  Surgeon: Benjamine Sprague, DO;  Location: [facility] ORS;  Service: General;  Laterality: N/A;  . TUBAL LIGATION    . VIDEO ASSISTED THORACOSCOPY (VATS)/THOROCOTOMY Right 06/23/2020   Procedure: ATTEMPTED VIDEO ASSISTED THORACOSCOPY (VATS);  Surgeon: Nestor Lewandowsky, MD;  Location: [facility] ORS;  Service: General;  Laterality: Right;    Social History   Socioeconomic History  . Marital status: Single    Spouse name: Not on file  . Number of children: Not on file  . Years of education: Not on file  . Highest education level: Not on file  Occupational History  .  Not on file  Tobacco Use  . Smoking status: Never Smoker  . Smokeless tobacco: Never Used  Vaping Use  . Vaping Use: Never used  Substance and Sexual Activity  . Alcohol use: Not Currently    Alcohol/week: 1.0 standard drink    Types: 1 Cans of beer per week  . Drug use: No  . Sexual activity: Not on  file  Other Topics Concern  . Not on file  Social History Narrative  . Not on file   Social Determinants of Health   Financial Resource Strain: Not on file  Food Insecurity: Not on file  Transportation Needs: Not on file  Physical Activity: Not on file  Stress: Not on file  Social Connections: Not on file  Intimate Partner Violence: Not on file     Family History  Problem Relation Age of Onset  . Diabetes Other   . Hypertension Other   . Diabetes Maternal Aunt   . Breast cancer Cousin        dx 66s  . Breast cancer Cousin        dx 92s     Current Facility-Administered Medications:  .  acetaminophen (TYLENOL) tablet 650 mg, 650 mg, Oral, Q6H PRN, Ivor Costa, MD, 650 mg at 10/09/20 2144 .  albuterol (PROVENTIL) (2.5 MG/3ML) 0.083% nebulizer solution 2.5 mg, 2.5 mg, Nebulization, BID, Lanney Gins, Fuad, MD, 2.5 mg at 10/14/20 0823 .  albuterol (VENTOLIN HFA) 108 (90 Base) MCG/ACT inhaler 2 puff, 2 puff, Inhalation, Q4H PRN, Ivor Costa, MD .  Ampicillin-Sulbactam (UNASYN) 3 g in sodium chloride 0.9 % 100 mL IVPB, 3 g, Intravenous, Q6H, Ravishankar, Jayashree, MD, Last Rate: 200 mL/hr at 10/14/20 0409, 3 g at 10/14/20 0409 .  calcium carbonate (OS-CAL - dosed in mg of elemental calcium) tablet 1,250 mg, 1,250 mg, Oral, Q breakfast, Ivor Costa, MD, 1,250 mg at 10/13/20 1404 .  Chlorhexidine Gluconate Cloth 2 % PADS 6 each, 6 each, Topical, Daily, Sharen Hones, MD, 6 each at 10/13/20 1001 .  dextromethorphan-guaiFENesin ([place] DM) 30-600 MG per 12 hr tablet 1 tablet, 1 tablet, Oral, BID PRN, Ivor Costa, MD .  senna [facility]) tablet 17.2 mg, 2 tablet, Oral, BID, 17.2 mg at 10/13/20 2109 **AND** docusate sodium (COLACE) capsule 100 mg, 100 mg, Oral, BID, Samuella Cota, MD, 100 mg at 10/13/20 2109 .  enoxaparin (LOVENOX) injection 40 mg, 40 mg, Subcutaneous, Q24H, Ivor Costa, MD, 40 mg at 10/13/20 2110 .  feeding supplement (ENSURE ENLIVE / ENSURE PLUS) liquid 237 mL, 237 mL, Oral,  TID BM, Sharen Hones, MD, 237 mL at 10/13/20 2114 .  ferrous sulfate tablet 325 mg, 325 mg, Oral, BID WC, Ivor Costa, MD, 325 mg at 10/13/20 1705 .  furosemide (LASIX) injection 40 mg, 40 mg, Intravenous, Daily, Lanney Gins, Fuad, MD, 40 mg at 10/13/20 1001 .  lidocaine-prilocaine (EMLA) cream 1 application, 1 application, Topical, PRN, Ivor Costa, MD .  magnesium chloride (SLOW-MAG) 64 MG SR tablet 64 mg, 1 tablet, Oral, Daily, Ivor Costa, MD, 64 mg at 10/13/20 1001 .  melatonin tablet 5 mg, 5 mg, Oral, QHS, Sharen Hones, MD, 5 mg at 10/13/20 2113 .  methylPREDNISolone sodium succinate (SOLU-MEDROL) 40 mg/mL injection 20 mg, 20 mg, Intravenous, Q12H, Lanney Gins, Fuad, MD, 20 mg at 10/14/20 0407 .  metoprolol tartrate (LOPRESSOR) tablet 50 mg, 50 mg, Oral, BID, Ivor Costa, MD, 50 mg at 10/13/20 2109 .  multivitamin with minerals tablet 1 tablet, 1 tablet, Oral, Daily, Samuella Cota,  MD .  ondansetron (ZOFRAN) injection 4 mg, 4 mg, Intravenous, Q8H PRN, Ivor Costa, MD .  potassium chloride SA (KLOR-CON) CR tablet 40 mEq, 40 mEq, Oral, Once, Benita Gutter, RPH .  sodium chloride (OCEAN) 0.65 % nasal spray 1 spray, 1 spray, Each Nare, PRN, Samuella Cota, MD .  sodium chloride flush (NS) 0.9 % injection 10-40 mL, 10-40 mL, Intracatheter, Q12H, Sharen Hones, MD, 10 mL at 10/13/20 2113 .  sodium chloride flush (NS) 0.9 % injection 10-40 mL, 10-40 mL, Intracatheter, PRN, Sharen Hones, MD, 10 mL at 10/10/20 1447   Physical exam:  Vitals:   10/13/20 2015 10/13/20 2323 10/14/20 0349 10/14/20 0757  BP:  108/67 116/66 124/72  Pulse: (!) 110 (!) 109 (!) 110 (!) 109  Resp:  16 16 16   Temp:  (!) 97.4 F (36.3 C) 98.5 F (36.9 C) 97.7 F (36.5 C)  TempSrc:  Oral  Oral  SpO2:  92% 97% (!) 89%  Weight:   173 lb 14.4 oz (78.9 kg)   Height:       Physical Exam Constitutional:      General: She is not in acute distress.    Appearance: She is not diaphoretic.  HENT:     Head: Normocephalic  and atraumatic.     Nose: Nose normal.     Mouth/Throat:     Pharynx: No oropharyngeal exudate.  Eyes:     General: No scleral icterus.    Pupils: Pupils are equal, round, and reactive to light.  Cardiovascular:     Rate and Rhythm: Normal rate and regular rhythm.     Heart sounds: No murmur heard.   Pulmonary:     Effort: Pulmonary effort is normal. No respiratory distress.     Comments: Decreased right lower base breath sound Rhonchi Abdominal:     General: There is no distension.     Palpations: Abdomen is soft.     Tenderness: There is no abdominal tenderness.  Musculoskeletal:        General: Normal range of motion.     Cervical back: Normal range of motion and neck supple.  Skin:    General: Skin is warm and dry.     Findings: No erythema.  Neurological:     Mental Status: She is alert and oriented to person, place, and time.     Cranial Nerves: No cranial nerve deficit.     Motor: No abnormal muscle tone.     Coordination: Coordination normal.  Psychiatric:        Mood and Affect: Affect normal.       CMP Latest Ref Rng & Units 10/14/2020  Glucose 70 - 99 mg/dL 118(H)  BUN 6 - 20 mg/dL 23(H)  Creatinine 0.44 - 1.00 mg/dL 0.37(L)  Sodium 135 - 145 mmol/L 143  Potassium 3.5 - 5.1 mmol/L 3.6  Chloride 98 - 111 mmol/L 101  CO2 22 - 32 mmol/L 34(H)  Calcium 8.9 - 10.3 mg/dL 8.1(L)  Total Protein 6.5 - 8.1 g/dL 6.1(L)  Total Bilirubin 0.3 - 1.2 mg/dL 0.4  Alkaline Phos 38 - 126 U/L 157(H)  AST 15 - 41 U/L 55(H)  ALT 0 - 44 U/L 16   CBC Latest Ref Rng & Units 10/14/2020  WBC 4.0 - 10.5 K/uL 22.3(H)  Hemoglobin 12.0 - 15.0 g/dL 9.2(L)  Hematocrit 36.0 - 46.0 % 31.9(L)  Platelets 150 - 400 K/uL 364    RADIOGRAPHIC STUDIES: I have personally reviewed the radiological images as  listed and agreed with the findings in the report. DG Chest 2 View  Result Date: 10/07/2020 CLINICAL DATA:  Hypoxia, shortness of breath, metastatic breast cancer EXAM: CHEST - 2 VIEW  COMPARISON:  09/13/2020 FINDINGS: LEFT subclavian Port-A-Cath with tip projecting over RIGHT atrium. Enlargement of cardiac silhouette. Pulmonary vascular congestion. Persistent enlargement of RIGHT hilum question mass or adenopathy. Increased infiltrate of the mid to lower RIGHT lung. Small RIGHT pleural effusion again seen. Accentuation of markings in LEFT perihilar region little changed. No pneumothorax or acute osseous findings. IMPRESSION: Increased RIGHT perihilar infiltrate with persistent small RIGHT pleural effusion. Enlargement of RIGHT pulmonary hilum by adenopathy versus perihilar mass. Electronically Signed   By: Lavonia Dana M.D.   On: 10/07/2020 11:32   CT Angio Chest PE W and/or Wo Contrast  Result Date: 10/07/2020 CLINICAL DATA:  Breast carcinoma. Tachycardia and shortness of breath EXAM: CT ANGIOGRAPHY CHEST WITH CONTRAST TECHNIQUE: Multidetector CT imaging of the chest was performed using the standard protocol during bolus administration of intravenous contrast. Multiplanar CT image reconstructions and MIPs were obtained to evaluate the vascular anatomy. CONTRAST:  43mL OMNIPAQUE IOHEXOL 350 MG/ML SOLN COMPARISON:  CT angiogram chest June 15, 2020; chest radiograph October 07, 2020 FINDINGS: Cardiovascular: There is no evident pulmonary embolus. No appreciable thoracic aortic aneurysm or dissection. Visualized great vessels appear unremarkable. There is no appreciable pericardial effusion or pericardial thickening. Port-A-Cath tip is in the superior vena cava near the cavoatrial junction. Mediastinum/Nodes: Thyroid appears normal. There are multiple axillary lymph nodes bilaterally, more severe on the right than on the left. The largest lymph node is seen in the right axillary region measuring 2.7 x 2.1 cm. There is adenopathy in the right supraclavicular and infraclavicular regions with extension of adenopathy into the right Peri carinal region. Largest individual lymph node in these areas  measures 2.6 x 1.8 cm. Several left supraclavicular lymph nodes are also noted, largest measuring 1.7 x 1.3 cm. There are several subcentimeter mediastinal lymph nodes. There is extensive retrocrural adenopathy. The largest lymph node or collection of matted lymph nodes is seen inferior to the aorta on the left posteriorly measuring 3.4 x 2.1 cm. No esophageal lesions are evident. Lungs/Pleura: There is extensive airspace consolidation in portions of the right middle and lower lobes with interspersed areas of loculated pleural effusion. There is ill-defined airspace opacity consistent with pneumonia in the left upper lobe. Loculated fluid tracks along the left major fissure. There may well be associated metastasis within the fissure given the extensive nodularity in this area. There is a small left pleural effusion with left base atelectasis. Pleural metastases noted along each upper hemithorax, primarily posteriorly as well as in the right apex. Upper Abdomen: Retrocrural adenopathy extends into the upper abdominal region. Visualized upper abdominal structures otherwise appear unremarkable. Musculoskeletal: There is a large mass occupying the right breast and chest wall region with invasion of the deep muscles of the chest wall on the right. There is also invasion of the right axilla. There is extensive subcutaneous thickening in both breast regions. There is a mass in the lateral left breast with extension of soft tissue prominence along the lateral left hemithorax, likely due to extension of neoplasm into the adjacent soft tissues. There is widespread sclerotic bony metastatic disease throughout the thoracic region. Review of the MIP images confirms the above findings. IMPRESSION: 1. No demonstrable pulmonary embolus. No thoracic aortic aneurysm or dissection. 2. Areas of airspace consolidation noted in the right middle and lower lobes. More  ill-defined infiltrate is noted in the left upper lobe anteriorly. Areas  of loculated fluid noted in the left major fissure. Nodularity in this area could indicate superimposed masses in left major fissure. There is pleural thickening along both posterior upper hemithorax regions, likely pleural metastases. Loculated fluid noted in areas of infiltrate on the right. Small left pleural effusion noted. 3. Adenopathy at multiple sites, most severe in the retrocrural regions bilaterally, right supraclavicular region, and right axillary region, although there is left axillary and left supraclavicular adenopathy. 4. Large mass lesions occupying the right breast with extension and invasion of the chest wall and right axillary regions. A lesser degree of metastatic disease to the left of midline in the subcutaneous tissues and lateral left breast/lateral left chest wall soft tissues noted. 5. Multifocal sclerotic bony metastatic disease throughout the thoracic region noted. Electronically Signed   By: Lowella Grip III M.D.   On: 10/07/2020 13:23   US Venous Img Upper Uni Right(DVT)  Result Date: 10/08/2020 CLINICAL DATA:  History of stage IV breast cancer now with right upper extremity pain and edema. Evaluate for DVT. EXAM: RIGHT UPPER EXTREMITY VENOUS DOPPLER ULTRASOUND TECHNIQUE: Gray-scale sonography with graded compression, as well as color Doppler and duplex ultrasound were performed to evaluate the upper extremity deep venous system from the level of the subclavian vein and including the jugular, axillary, basilic, radial, ulnar and upper cephalic vein. Spectral Doppler was utilized to evaluate flow at rest and with distal augmentation maneuvers. COMPARISON:  None. FINDINGS: Contralateral Subclavian Vein: Respiratory phasicity is normal and symmetric with the symptomatic side. No evidence of thrombus. Normal compressibility. Internal Jugular Vein: No evidence of thrombus. Normal compressibility, respiratory phasicity and response to augmentation. Subclavian Vein: No evidence of  thrombus. Normal compressibility, respiratory phasicity and response to augmentation. Axillary Vein: Appears patent where imaged though evaluation degraded secondary to patient's discomfort with sonographic evaluation of the axilla. Cephalic Vein: No evidence of thrombus. Normal compressibility, respiratory phasicity and response to augmentation. Basilic Vein: No evidence of thrombus. Normal compressibility, respiratory phasicity and response to augmentation. Brachial Veins: No evidence of thrombus within either of the paired brachial veins. Normal compressibility, respiratory phasicity and response to augmentation. Radial Veins: No evidence of thrombus. Normal compressibility, respiratory phasicity and response to augmentation. Ulnar Veins: No evidence of thrombus. Normal compressibility, respiratory phasicity and response to augmentation. Venous Reflux:  None visualized. Other Findings: There is a moderate amount of subcutaneous edema at the level of the right upper arm. IMPRESSION: No evidence of DVT within the right upper extremity. Electronically Signed   By: Sandi Mariscal M.D.   On: 10/08/2020 14:29   DG Chest Port 1 View  Result Date: 10/13/2020 CLINICAL DATA:  Pleural effusion EXAM: PORTABLE CHEST 1 VIEW COMPARISON:  10/07/2020 FINDINGS: Unchanged positioning of left-sided Port-A-Cath. Stable cardiomediastinal contours. Persistent small right pleural effusion. Slight progression of airspace opacity throughout the right lung most pronounced within the right lung base. Similar interstitial opacities throughout the left lung. No pneumothorax. IMPRESSION: 1. Slight progression of airspace opacity throughout the right lung, most pronounced within the right lung base. 2. Persistent small right pleural effusion. Electronically Signed   By: Davina Poke D.O.   On: 10/13/2020 14:10   ECHOCARDIOGRAM COMPLETE  Result Date: 10/10/2020    ECHOCARDIOGRAM REPORT   Patient Name:   MARCIANNE OZBUN Date of Exam:  10/10/2020 Medical Rec #:  [id number]           Height:  70.0 in Accession #:    [id number]          Weight:       178.7 lb Date of Birth:  [birth year]           BSA:          1.990 m Patient Age:    1 years            BP:           119/79 mmHg Patient Gender: F                   HR:           105 bpm. Exam Location:  [facility] Procedure: 2D Echo, Limited Color Doppler and Cardiac Doppler Indications:     R06.03 Acute Respiratory Distress  History:         Patient has no prior history of Echocardiogram examinations.                  Signs/Symptoms:Shortness of Breath. Breast cancer.  Sonographer:     Charmayne Sheer RDCS (AE) Referring Phys:  [id number] Sharen Hones Diagnosing Phys: Kathlyn Sacramento MD  Sonographer Comments: Technically challenging study due to limited acoustic windows. IMPRESSIONS  1. Left ventricular ejection fraction, by estimation, is 50 to 55%. The left ventricle has low normal function. Left ventricular endocardial border not optimally defined to evaluate regional wall motion. Left ventricular diastolic parameters are indeterminate.  2. Right ventricular systolic function is normal. The right ventricular size is normal. Tricuspid regurgitation signal is inadequate for assessing PA pressure.  3. The mitral valve is normal in structure. No evidence of mitral valve regurgitation. No evidence of mitral stenosis.  4. The aortic valve is normal in structure. Aortic valve regurgitation is not visualized. No aortic stenosis is present.  5. The inferior vena cava is normal in size with greater than 50% respiratory variability, suggesting right atrial pressure of 3 mmHg. FINDINGS  Left Ventricle: Left ventricular ejection fraction, by estimation, is 50 to 55%. The left ventricle has low normal function. Left ventricular endocardial border not optimally defined to evaluate regional wall motion. The left ventricular internal cavity  size was normal in size. There is no left ventricular hypertrophy. Left ventricular  diastolic parameters are indeterminate. Right Ventricle: The right ventricular size is normal. No increase in right ventricular wall thickness. Right ventricular systolic function is normal. Tricuspid regurgitation signal is inadequate for assessing PA pressure. Left Atrium: Left atrial size was normal in size. Right Atrium: Right atrial size was normal in size. Pericardium: There is no evidence of pericardial effusion. Mitral Valve: The mitral valve is normal in structure. No evidence of mitral valve regurgitation. No evidence of mitral valve stenosis. MV peak gradient, 4.5 mmHg. The mean mitral valve gradient is 2.0 mmHg. Tricuspid Valve: The tricuspid valve is normal in structure. Tricuspid valve regurgitation is not demonstrated. No evidence of tricuspid stenosis. Aortic Valve: The aortic valve is normal in structure. Aortic valve regurgitation is not visualized. No aortic stenosis is present. Aortic valve mean gradient measures 4.0 mmHg. Aortic valve peak gradient measures 6.8 mmHg. Pulmonic Valve: The pulmonic valve was not well visualized. Pulmonic valve regurgitation is not visualized. No evidence of pulmonic stenosis. Aorta: The aortic root is normal in size and structure. Venous: The inferior vena cava is normal in size with greater than 50% respiratory variability, suggesting right atrial pressure of 3 mmHg. IAS/Shunts: No atrial level shunt detected by color flow Doppler.  LV Volumes (MOD) LV vol d, MOD A4C: 99.4 ml Diastology LV vol s, MOD A4C: 58.3 ml LV e' lateral:   10.30 cm/s LV SV MOD A4C:     99.4 ml LV E/e' lateral: 5.8  LEFT ATRIUM           Index LA Vol (A4C): 41.5 ml 20.86 ml/m  AORTIC VALVE AV Vmax:           130.00 cm/s AV Vmean:          93.400 cm/s AV VTI:            0.243 m AV Peak Grad:      6.8 mmHg AV Mean Grad:      4.0 mmHg LVOT Vmax:         92.70 cm/s LVOT Vmean:        60.500 cm/s LVOT VTI:          0.151 m LVOT/AV VTI ratio: 0.62 MITRAL VALVE MV Area (PHT): 6.96 cm    SHUNTS  MV Peak grad:  4.5 mmHg    Systemic VTI: 0.15 m MV Mean grad:  2.0 mmHg MV Vmax:       1.06 m/s MV Vmean:      67.9 cm/s MV Decel Time: 109 msec MV E velocity: 59.60 cm/s MV A velocity: 68.60 cm/s MV E/A ratio:  0.87 Kathlyn Sacramento MD Electronically signed by Kathlyn Sacramento MD Signature Date/Time: 10/10/2020/2:00:06 PM    Final     Assessment and plan-   # #Acute respiratory failure with hypoxia ID on board. Switched to Augmentin  Continue Solu-Medrol and IV Lasix.  #Metastatic triple negative breast cancer, patient has high tumor burden, progressed on first-line palliative chemotherapy with Taxol.  There are other treatment options available, i.e. Adriamycin +/- Cytoxan, Sacituzumab govitecan.  Immunotherapy is less likely beneficial given her CPS <1. Patient is very sick with visceral crisis/obstructive pneumonia.  She desires trying additional chemotherapy treatment with hope of prolonging her life. Labs are reviewed and discussed with patient. Proceed with Adriamycin 60mg /m2 today.  Zofran 4mg  Q8 PRN History of QTc prolongation, OTc corrected on recent EKG. Keep potassium above 4, and Mag >2. Start her on daily oral potassium 36meq.  Cbc on 10/16/20 Thank you for allowing me to participate in the care of this patient. Code status DNR  Earlie Server, MD, PhD Hematology Oncology [facility] at [facility] Pager- [phone] 10/14/2020

## 2020-10-14 NOTE — Progress Notes (Signed)
OT Cancellation Note  Patient Details Name: Jessica Willis MRN: [id number] DOB: 1965/10/29   Cancelled Treatment:    Reason Eval/Treat Not Completed: Patient at procedure or test/ unavailable. OT order received and chart reviewed. Pt having just returned from chemo treatment and declines OT intervention at this time. OT to re attempt when pt is next available.  Darleen Crocker, [place], OTR/L , [facility] ascom [phone]  10/14/20, 2:22 PM    10/14/2020, 2:21 PM

## 2020-10-14 NOTE — Consult Note (Signed)
PHARMACY CONSULT NOTE  Pharmacy Consult for Electrolyte Monitoring and Replacement   Recent Labs: Potassium (mmol/L)  Date Value  10/14/2020 3.6   Magnesium (mg/dL)  Date Value  10/12/2020 2.1   Calcium (mg/dL)  Date Value  10/14/2020 8.1 (L)   Albumin (g/dL)  Date Value  10/14/2020 2.3 (L)   Phosphorus (mg/dL)  Date Value  10/13/2020 3.2   Sodium (mmol/L)  Date Value  10/14/2020 143   Assessment: Patient is a 55 y/o F with medical history including metastatic breast cancer on chemotherapy, tachycardia, anemia, GERD, malignant pleural effusion, hemothorax who was admitted 2/25 with respiratory failure secondary to pneumonia versus worsening metastatic disease. Pharmacy consulted to assist with electrolyte monitoring and replacement as indicated.   Per chart review, appetite appears poor.   Diuresis: IV Lasix 40 mg daily  Goal of Therapy:  Electrolytes within normal limits  Plan:  3/4 AM labs: Na 143, K 3.6, Co2 34 --Will order KCl 40 mEq PO x 1 given IV Lasix --Will follow-up electrolytes with AM labs  Benita Gutter 10/14/2020 8:03 AM

## 2020-10-14 NOTE — Plan of Care (Signed)
  Problem: Education: Goal: Knowledge of General Education information will improve Description: Including pain rating scale, medication(s)/side effects and non-pharmacologic comfort measures Outcome: Progressing   Problem: Health Behavior/Discharge Planning: Goal: Ability to manage health-related needs will improve Outcome: Progressing   Problem: Clinical Measurements: Goal: Ability to maintain clinical measurements within normal limits will improve Outcome: Progressing Goal: Will remain free from infection Outcome: Progressing Goal: Diagnostic test results will improve Outcome: Progressing Goal: Respiratory complications will improve Outcome: Progressing Goal: Cardiovascular complication will be avoided Outcome: Progressing   Problem: Activity: Goal: Risk for activity intolerance will decrease Outcome: Progressing   Problem: Nutrition: Goal: Adequate nutrition will be maintained Outcome: Progressing   Problem: Coping: Goal: Level of anxiety will decrease Outcome: Progressing   Problem: Elimination: Goal: Will not experience complications related to bowel motility Outcome: Progressing Goal: Will not experience complications related to urinary retention Outcome: Progressing   Problem: Pain Managment: Goal: General experience of comfort will improve Outcome: Progressing   Problem: Safety: Goal: Ability to remain free from injury will improve Outcome: Progressing   Problem: Skin Integrity: Goal: Risk for impaired skin integrity will decrease Outcome: Progressing    Pt is AAOx4. Pt denies any pain. Wound care on right breast was done. Pt's VSS. All needs met. Call light is within reach. Safety measures maintained. Will continue to monitor.

## 2020-10-14 NOTE — Progress Notes (Addendum)
Pulmonary Medicine          Date: 10/14/2020,   MRN# [id number] Jessica Willis 1965/10/17     AdmissionWeight: 77.6 kg                 CurrentWeight: 78.9 kg   Referring physician: Dr Tasia Catchings   CHIEF COMPLAINT:      HISTORY OF PRESENT ILLNESS   Jessica Willis is a 55 y.o. female with medical history significant of stage IV metastasized breast cancer on chemotherapy, tachycardia, GERD, anemia, malignant pleural effusion, hemothorax, who presents with shortness of breath. In ER initally presentedwith cough and sob. She had massive rt pleural effusion with collapse of rt lung . She also had rt breast mass with changes in skin color for  6 months . She underwent thoracenteiss and it was metastatic adenocarcinoma. MRI of the brain showed scattered metastatic implants upper cervical spine.  She had a port placement and also underwent incisional biopsy of the rt breast mass on 06/28/20.  She got a dose of taxol while inpatient and was discharged on 07/01/20. She followed up with oncologist as OP, on palliative chemotherapy with weekly taxol.  Has been having tachycardia and SOB- saw Cardiology on 09/26/20 Prolonged QT and tachycardia. Pt was found to have WBC 22.3, negative Covid PCR, lactic acid 2.3, INR 1.3, PTT 36, electrolytes renal function okay, temperature 99.5, blood pressure 138/80, heart rate 134, RR 20, oxygen saturation 87% on room air, which improved her to 100% on 2 L oxygen. PCCM consultation placed due to complicated respiratory status with metastatic lung cancer and large malignant effusion. CT chest below shows pleural effusion bilaterally worse on right with metastatic lesions visible on bony spine from thoracic spine down and significant hilar and mediastinal lymphadneopathy suggestive of widespread mets.   10/13/20- patient reports improved breathing, weaned O2 to 4L [place].  She is diuresing well which ishelping her breathing.  She responded well to steroids,  inflammatory markers are elevated likely due to active malignancy and CAP(although most of this is likely fluid and unclear how much is actual infection).  She feels strong enough to get up and participate with PT.  She is using IS and able to take tidal volume 500cc today.  I have encouraged to use every hour. I met with son and reviewed care plan.   10/14/20-  Patient had onc tx today. She felt onset of SOB few hours after coming back however vitals stable.    PAST MEDICAL HISTORY   Past Medical History:  Diagnosis Date  . Anemia   . Family history of breast cancer   . Patient denies medical problems      SURGICAL HISTORY   Past Surgical History:  Procedure Laterality Date  . BREAST BIOPSY  06/28/2020   Procedure: BREAST BIOPSY;  Surgeon: Benjamine Sprague, DO;  Location: [facility] ORS;  Service: General;;  . PORTACATH PLACEMENT N/A 06/28/2020   Procedure: INSERTION PORT-A-CATH;  Surgeon: Benjamine Sprague, DO;  Location: [facility] ORS;  Service: General;  Laterality: N/A;  . TUBAL LIGATION    . VIDEO ASSISTED THORACOSCOPY (VATS)/THOROCOTOMY Right 06/23/2020   Procedure: ATTEMPTED VIDEO ASSISTED THORACOSCOPY (VATS);  Surgeon: Nestor Lewandowsky, MD;  Location: [facility] ORS;  Service: General;  Laterality: Right;     FAMILY HISTORY   Family History  Problem Relation Age of Onset  . Diabetes Other   . Hypertension Other   . Diabetes Maternal Aunt   . Breast cancer Cousin  dx 86s  . Breast cancer Cousin        dx 5s     SOCIAL HISTORY   Social History   Tobacco Use  . Smoking status: Never Smoker  . Smokeless tobacco: Never Used  Vaping Use  . Vaping Use: Never used  Substance Use Topics  . Alcohol use: Not Currently    Alcohol/week: 1.0 standard drink    Types: 1 Cans of beer per week  . Drug use: No     MEDICATIONS    Home Medication:    Current Medication:  Current Facility-Administered Medications:  .  acetaminophen (TYLENOL) tablet 650 mg, 650 mg, Oral, Q6H PRN, Ivor Costa, MD, 650 mg at 10/09/20 2144 .  albumin human 25 % solution 12.5 g, 12.5 g, Intravenous, Daily, Zakhari Fogel, MD .  albuterol (PROVENTIL) (2.5 MG/3ML) 0.083% nebulizer solution 2.5 mg, 2.5 mg, Nebulization, BID, Lanney Gins, Zaccai Chavarin, MD, 2.5 mg at 10/14/20 0823 .  albuterol (VENTOLIN HFA) 108 (90 Base) MCG/ACT inhaler 2 puff, 2 puff, Inhalation, Q4H PRN, Ivor Costa, MD .  Ampicillin-Sulbactam (UNASYN) 3 g in sodium chloride 0.9 % 100 mL IVPB, 3 g, Intravenous, Q6H, Ravishankar, Jayashree, MD, Last Rate: 200 mL/hr at 10/14/20 1009, 3 g at 10/14/20 1009 .  calcium carbonate (OS-CAL - dosed in mg of elemental calcium) tablet 1,250 mg, 1,250 mg, Oral, Q breakfast, Ivor Costa, MD, 1,250 mg at 10/14/20 0900 .  Chlorhexidine Gluconate Cloth 2 % PADS 6 each, 6 each, Topical, Daily, Sharen Hones, MD, 6 each at 10/13/20 1001 .  dextromethorphan-guaiFENesin (MUCINEX DM) 30-600 MG per 12 hr tablet 1 tablet, 1 tablet, Oral, BID PRN, Ivor Costa, MD .  senna (SENOKOT) tablet 17.2 mg, 2 tablet, Oral, BID, 17.2 mg at 10/14/20 0948 **AND** docusate sodium (COLACE) capsule 100 mg, 100 mg, Oral, BID, Samuella Cota, MD, 100 mg at 10/14/20 0949 .  enoxaparin (LOVENOX) injection 40 mg, 40 mg, Subcutaneous, Q24H, Ivor Costa, MD, 40 mg at 10/13/20 2110 .  feeding supplement (ENSURE ENLIVE / ENSURE PLUS) liquid 237 mL, 237 mL, Oral, TID BM, Sharen Hones, MD, 237 mL at 10/14/20 1002 .  ferrous sulfate tablet 325 mg, 325 mg, Oral, BID WC, Ivor Costa, MD, 325 mg at 10/14/20 0900 .  furosemide (LASIX) injection 40 mg, 40 mg, Intravenous, Daily, Ottie Glazier, MD, 40 mg at 10/14/20 0951 .  lidocaine-prilocaine (EMLA) cream 1 application, 1 application, Topical, PRN, Ivor Costa, MD .  magnesium chloride (SLOW-MAG) 64 MG SR tablet 64 mg, 1 tablet, Oral, Daily, Ivor Costa, MD, 64 mg at 10/14/20 0951 .  melatonin tablet 5 mg, 5 mg, Oral, QHS, Sharen Hones, MD, 5 mg at 10/13/20 2113 .  methylPREDNISolone sodium succinate  (SOLU-MEDROL) 40 mg/mL injection 20 mg, 20 mg, Intravenous, Q12H, Lanney Gins, Zylee Marchiano, MD, 20 mg at 10/14/20 0407 .  metoprolol tartrate (LOPRESSOR) tablet 50 mg, 50 mg, Oral, BID, Ivor Costa, MD, 50 mg at 10/14/20 1000 .  multivitamin with minerals tablet 1 tablet, 1 tablet, Oral, Daily, Samuella Cota, MD, 1 tablet at 10/14/20 0950 .  ondansetron (ZOFRAN) injection 4 mg, 4 mg, Intravenous, Q8H PRN, Ivor Costa, MD .  sodium chloride (OCEAN) 0.65 % nasal spray 1 spray, 1 spray, Each Nare, PRN, Samuella Cota, MD .  sodium chloride flush (NS) 0.9 % injection 10-40 mL, 10-40 mL, Intracatheter, Q12H, Sharen Hones, MD, 10 mL at 10/14/20 1003 .  sodium chloride flush (NS) 0.9 % injection 10-40 mL, 10-40 mL, Intracatheter, PRN, Roosevelt Locks,  Dekui, MD, 10 mL at 10/10/20 1447    ALLERGIES   Patient has no known allergies.     REVIEW OF SYSTEMS    Review of Systems:  Gen:  Denies  fever, sweats, chills weigh loss  HEENT: Denies blurred vision, double vision, ear pain, eye pain, hearing loss, nose bleeds, sore throat Cardiac:  No dizziness, chest pain or heaviness, chest tightness,edema Resp:   Denies cough or sputum porduction, shortness of breath,wheezing, hemoptysis,  Gi: Denies swallowing difficulty, stomach pain, nausea or vomiting, diarrhea, constipation, bowel incontinence Gu:  Denies bladder incontinence, burning urine Ext:   Denies Joint pain, stiffness or swelling Skin: Denies  skin rash, easy bruising or bleeding or hives Endoc:  Denies polyuria, polydipsia , polyphagia or weight change Psych:   Denies depression, insomnia or hallucinations   Other:  All other systems negative   VS: BP 119/80 (BP Location: Left Arm)   Pulse (!) 105   Temp 97.6 F (36.4 C) (Oral)   Resp 18   Ht _0  (1.778 m)   Wt 78.9 kg   LMP 06/16/2015 Comment: Tubal Ligation   SpO2 93%   BMI 24.95 kg/m      PHYSICAL EXAM    GENERAL:NAD, no fevers, chills, no weakness no fatigue HEAD:  Normocephalic, atraumatic.  EYES: Pupils equal, round, reactive to light. Extraocular muscles intact. No scleral icterus.  MOUTH: Moist mucosal membrane. Dentition intact. No abscess noted.  EAR, NOSE, THROAT: Clear without exudates. No external lesions.  NECK: Supple. No thyromegaly. No nodules. No JVD.  PULMONARY: Diffuse coarse rhonchi right sided +wheezes CARDIOVASCULAR: S1 and S2. Regular rate and rhythm. No murmurs, rubs, or gallops. No edema. Pedal pulses 2+ bilaterally.  GASTROINTESTINAL: Soft, nontender, nondistended. No masses. Positive bowel sounds. No hepatosplenomegaly.  MUSCULOSKELETAL: No swelling, clubbing, or edema. Range of motion full in all extremities.  NEUROLOGIC: Cranial nerves II through XII are intact. No gross focal neurological deficits. Sensation intact. Reflexes intact.  SKIN: No ulceration, lesions, rashes, or cyanosis. Skin warm and dry. Turgor intact.  PSYCHIATRIC: Mood, affect within normal limits. The patient is awake, alert and oriented x 3. Insight, judgment intact.       IMAGING    DG Chest 2 View  Result Date: 10/07/2020 CLINICAL DATA:  Hypoxia, shortness of breath, metastatic breast cancer EXAM: CHEST - 2 VIEW COMPARISON:  09/13/2020 FINDINGS: LEFT subclavian Port-A-Cath with tip projecting over RIGHT atrium. Enlargement of cardiac silhouette. Pulmonary vascular congestion. Persistent enlargement of RIGHT hilum question mass or adenopathy. Increased infiltrate of the mid to lower RIGHT lung. Small RIGHT pleural effusion again seen. Accentuation of markings in LEFT perihilar region little changed. No pneumothorax or acute osseous findings. IMPRESSION: Increased RIGHT perihilar infiltrate with persistent small RIGHT pleural effusion. Enlargement of RIGHT pulmonary hilum by adenopathy versus perihilar mass. Electronically Signed   By: Lavonia Dana M.D.   On: 10/07/2020 11:32   CT Angio Chest PE W and/or Wo Contrast  Result Date: 10/07/2020 CLINICAL DATA:   Breast carcinoma. Tachycardia and shortness of breath EXAM: CT ANGIOGRAPHY CHEST WITH CONTRAST TECHNIQUE: Multidetector CT imaging of the chest was performed using the standard protocol during bolus administration of intravenous contrast. Multiplanar CT image reconstructions and MIPs were obtained to evaluate the vascular anatomy. CONTRAST:  74m OMNIPAQUE IOHEXOL 350 MG/ML SOLN COMPARISON:  CT angiogram chest June 15, 2020; chest radiograph October 07, 2020 FINDINGS: Cardiovascular: There is no evident pulmonary embolus. No appreciable thoracic aortic aneurysm or dissection. Visualized great vessels appear  unremarkable. There is no appreciable pericardial effusion or pericardial thickening. Port-A-Cath tip is in the superior vena cava near the cavoatrial junction. Mediastinum/Nodes: Thyroid appears normal. There are multiple axillary lymph nodes bilaterally, more severe on the right than on the left. The largest lymph node is seen in the right axillary region measuring 2.7 x 2.1 cm. There is adenopathy in the right supraclavicular and infraclavicular regions with extension of adenopathy into the right Peri carinal region. Largest individual lymph node in these areas measures 2.6 x 1.8 cm. Several left supraclavicular lymph nodes are also noted, largest measuring 1.7 x 1.3 cm. There are several subcentimeter mediastinal lymph nodes. There is extensive retrocrural adenopathy. The largest lymph node or collection of matted lymph nodes is seen inferior to the aorta on the left posteriorly measuring 3.4 x 2.1 cm. No esophageal lesions are evident. Lungs/Pleura: There is extensive airspace consolidation in portions of the right middle and lower lobes with interspersed areas of loculated pleural effusion. There is ill-defined airspace opacity consistent with pneumonia in the left upper lobe. Loculated fluid tracks along the left major fissure. There may well be associated metastasis within the fissure given the  extensive nodularity in this area. There is a small left pleural effusion with left base atelectasis. Pleural metastases noted along each upper hemithorax, primarily posteriorly as well as in the right apex. Upper Abdomen: Retrocrural adenopathy extends into the upper abdominal region. Visualized upper abdominal structures otherwise appear unremarkable. Musculoskeletal: There is a large mass occupying the right breast and chest wall region with invasion of the deep muscles of the chest wall on the right. There is also invasion of the right axilla. There is extensive subcutaneous thickening in both breast regions. There is a mass in the lateral left breast with extension of soft tissue prominence along the lateral left hemithorax, likely due to extension of neoplasm into the adjacent soft tissues. There is widespread sclerotic bony metastatic disease throughout the thoracic region. Review of the MIP images confirms the above findings. IMPRESSION: 1. No demonstrable pulmonary embolus. No thoracic aortic aneurysm or dissection. 2. Areas of airspace consolidation noted in the right middle and lower lobes. More ill-defined infiltrate is noted in the left upper lobe anteriorly. Areas of loculated fluid noted in the left major fissure. Nodularity in this area could indicate superimposed masses in left major fissure. There is pleural thickening along both posterior upper hemithorax regions, likely pleural metastases. Loculated fluid noted in areas of infiltrate on the right. Small left pleural effusion noted. 3. Adenopathy at multiple sites, most severe in the retrocrural regions bilaterally, right supraclavicular region, and right axillary region, although there is left axillary and left supraclavicular adenopathy. 4. Large mass lesions occupying the right breast with extension and invasion of the chest wall and right axillary regions. A lesser degree of metastatic disease to the left of midline in the subcutaneous tissues  and lateral left breast/lateral left chest wall soft tissues noted. 5. Multifocal sclerotic bony metastatic disease throughout the thoracic region noted. Electronically Signed   By: Lowella Grip III M.D.   On: 10/07/2020 13:23   US Venous Img Upper Uni Right(DVT)  Result Date: 10/08/2020 CLINICAL DATA:  History of stage IV breast cancer now with right upper extremity pain and edema. Evaluate for DVT. EXAM: RIGHT UPPER EXTREMITY VENOUS DOPPLER ULTRASOUND TECHNIQUE: Gray-scale sonography with graded compression, as well as color Doppler and duplex ultrasound were performed to evaluate the upper extremity deep venous system from the level of the subclavian  vein and including the jugular, axillary, basilic, radial, ulnar and upper cephalic vein. Spectral Doppler was utilized to evaluate flow at rest and with distal augmentation maneuvers. COMPARISON:  None. FINDINGS: Contralateral Subclavian Vein: Respiratory phasicity is normal and symmetric with the symptomatic side. No evidence of thrombus. Normal compressibility. Internal Jugular Vein: No evidence of thrombus. Normal compressibility, respiratory phasicity and response to augmentation. Subclavian Vein: No evidence of thrombus. Normal compressibility, respiratory phasicity and response to augmentation. Axillary Vein: Appears patent where imaged though evaluation degraded secondary to patient's discomfort with sonographic evaluation of the axilla. Cephalic Vein: No evidence of thrombus. Normal compressibility, respiratory phasicity and response to augmentation. Basilic Vein: No evidence of thrombus. Normal compressibility, respiratory phasicity and response to augmentation. Brachial Veins: No evidence of thrombus within either of the paired brachial veins. Normal compressibility, respiratory phasicity and response to augmentation. Radial Veins: No evidence of thrombus. Normal compressibility, respiratory phasicity and response to augmentation. Ulnar Veins: No  evidence of thrombus. Normal compressibility, respiratory phasicity and response to augmentation. Venous Reflux:  None visualized. Other Findings: There is a moderate amount of subcutaneous edema at the level of the right upper arm. IMPRESSION: No evidence of DVT within the right upper extremity. Electronically Signed   By: Sandi Mariscal M.D.   On: 10/08/2020 14:29   DG Chest Port 1 View  Result Date: 10/13/2020 CLINICAL DATA:  Pleural effusion EXAM: PORTABLE CHEST 1 VIEW COMPARISON:  10/07/2020 FINDINGS: Unchanged positioning of left-sided Port-A-Cath. Stable cardiomediastinal contours. Persistent small right pleural effusion. Slight progression of airspace opacity throughout the right lung most pronounced within the right lung base. Similar interstitial opacities throughout the left lung. No pneumothorax. IMPRESSION: 1. Slight progression of airspace opacity throughout the right lung, most pronounced within the right lung base. 2. Persistent small right pleural effusion. Electronically Signed   By: Davina Poke D.O.   On: 10/13/2020 14:10   ECHOCARDIOGRAM COMPLETE  Result Date: 10/10/2020    ECHOCARDIOGRAM REPORT   Patient Name:   PAMI WOOL Date of Exam: 10/10/2020 Medical Rec #:  [id number]           Height:       70.0 in Accession #:    [id number]          Weight:       178.7 lb Date of Birth:  03/01/1966           BSA:          1.990 m Patient Age:    28 years            BP:           119/79 mmHg Patient Gender: F                   HR:           105 bpm. Exam Location:  [facility] Procedure: 2D Echo, Limited Color Doppler and Cardiac Doppler Indications:     R06.03 Acute Respiratory Distress  History:         Patient has no prior history of Echocardiogram examinations.                  Signs/Symptoms:Shortness of Breath. Breast cancer.  Sonographer:     Charmayne Sheer RDCS (AE) Referring Phys:  [id number] Sharen Hones Diagnosing Phys: Kathlyn Sacramento MD  Sonographer Comments: Technically challenging study due  to limited acoustic windows. IMPRESSIONS  1. Left ventricular ejection fraction, by estimation, is 50 to 55%. The left ventricle  has low normal function. Left ventricular endocardial border not optimally defined to evaluate regional wall motion. Left ventricular diastolic parameters are indeterminate.  2. Right ventricular systolic function is normal. The right ventricular size is normal. Tricuspid regurgitation signal is inadequate for assessing PA pressure.  3. The mitral valve is normal in structure. No evidence of mitral valve regurgitation. No evidence of mitral stenosis.  4. The aortic valve is normal in structure. Aortic valve regurgitation is not visualized. No aortic stenosis is present.  5. The inferior vena cava is normal in size with greater than 50% respiratory variability, suggesting right atrial pressure of 3 mmHg. FINDINGS  Left Ventricle: Left ventricular ejection fraction, by estimation, is 50 to 55%. The left ventricle has low normal function. Left ventricular endocardial border not optimally defined to evaluate regional wall motion. The left ventricular internal cavity  size was normal in size. There is no left ventricular hypertrophy. Left ventricular diastolic parameters are indeterminate. Right Ventricle: The right ventricular size is normal. No increase in right ventricular wall thickness. Right ventricular systolic function is normal. Tricuspid regurgitation signal is inadequate for assessing PA pressure. Left Atrium: Left atrial size was normal in size. Right Atrium: Right atrial size was normal in size. Pericardium: There is no evidence of pericardial effusion. Mitral Valve: The mitral valve is normal in structure. No evidence of mitral valve regurgitation. No evidence of mitral valve stenosis. MV peak gradient, 4.5 mmHg. The mean mitral valve gradient is 2.0 mmHg. Tricuspid Valve: The tricuspid valve is normal in structure. Tricuspid valve regurgitation is not demonstrated. No evidence of  tricuspid stenosis. Aortic Valve: The aortic valve is normal in structure. Aortic valve regurgitation is not visualized. No aortic stenosis is present. Aortic valve mean gradient measures 4.0 mmHg. Aortic valve peak gradient measures 6.8 mmHg. Pulmonic Valve: The pulmonic valve was not well visualized. Pulmonic valve regurgitation is not visualized. No evidence of pulmonic stenosis. Aorta: The aortic root is normal in size and structure. Venous: The inferior vena cava is normal in size with greater than 50% respiratory variability, suggesting right atrial pressure of 3 mmHg. IAS/Shunts: No atrial level shunt detected by color flow Doppler.   LV Volumes (MOD) LV vol d, MOD A4C: 99.4 ml Diastology LV vol s, MOD A4C: 58.3 ml LV e' lateral:   10.30 cm/s LV SV MOD A4C:     99.4 ml LV E/e' lateral: 5.8  LEFT ATRIUM           Index LA Vol (A4C): 41.5 ml 20.86 ml/m  AORTIC VALVE AV Vmax:           130.00 cm/s AV Vmean:          93.400 cm/s AV VTI:            0.243 m AV Peak Grad:      6.8 mmHg AV Mean Grad:      4.0 mmHg LVOT Vmax:         92.70 cm/s LVOT Vmean:        60.500 cm/s LVOT VTI:          0.151 m LVOT/AV VTI ratio: 0.62 MITRAL VALVE MV Area (PHT): 6.96 cm    SHUNTS MV Peak grad:  4.5 mmHg    Systemic VTI: 0.15 m MV Mean grad:  2.0 mmHg MV Vmax:       1.06 m/s MV Vmean:      67.9 cm/s MV Decel Time: 109 msec MV E velocity: 59.60 cm/s MV A velocity:  68.60 cm/s MV E/A ratio:  0.87 Kathlyn Sacramento MD Electronically signed by Kathlyn Sacramento MD Signature Date/Time: 10/10/2020/2:00:06 PM    Final       ASSESSMENT/PLAN   Malignant pleural effusion    - since patient had initial therapeutic response to thoracentesis she will likely benefit from permament tunneled pleural catheter via IR service as palliative option for malignant effusion due to high probability of rapid re-accumulation    -there is no renal failure, hepatic synthetic function within reference range without HASH/cirhosis, no CHF per most recent  TTE   - no lymphadema  Metastatic lung cancer   -Currently being followed by oncology Dr Tasia Catchings - appreciate input     Compressive atelectasis   Worse at Right lowe lobe - patient is weak and unlikely to be able to perform well with incentive spirometry.  She is a good candidate for albuterol infused Metaneb therapy can do BID to help with recruitment.     Chronic deconditioning and severe protein calorie malnutrition     -nutritional and PT evaluation     -palliative care on case - appreciate input     -bitemporal wasting noted on examination     - high risk refeeding syndrome - monitor electorlytes - will place consult for pharmD and RD   Acute hypoxemic respiratory failure    - patient generally not on supplemental O2 currently on 7L/min supplemental O2>>4>>5- 10/15/20    - she has leukocytosis but gram stain is negative on pleural fluid and there is no obvious infiltrate although basis bilaterally may have pneumonia, this is obscured by effusion and atelectatic sement.  Legionella and strep pneumo ag is negative. I feel that CAP is less likely.  Have ordered iflammatory markers and PCT.      - lasix to help with residual effusion     -Soluedrol 20 bid IV     - lasix 40 iv daily      Hold-10/15/20- albumin 14m 25% 1 amd daily with diuresis     - chest physitherapy to recruit atelectatic lungs     - respiratory viral panel to rule out non covid viral etiology      -etiology is all of the above conditions     - TTE with no findings to suggest CHF realted hypoxemia  -repeat cxr 10/15/20   Thank you for allowing me to participate in the care of this patient.   Patient/Family are satisfied with care plan and all questions have been answered.  This document was prepared using Dragon voice recognition software and may include unintentional dictation errors.     FOttie Glazier M.D.  [facility] of [facility]

## 2020-10-15 ENCOUNTER — Inpatient Hospital Stay: Payer: Medicaid Other

## 2020-10-15 DIAGNOSIS — C50919 Malignant neoplasm of unspecified site of unspecified female breast: Secondary | ICD-10-CM | POA: Diagnosis not present

## 2020-10-15 DIAGNOSIS — J9601 Acute respiratory failure with hypoxia: Secondary | ICD-10-CM | POA: Diagnosis not present

## 2020-10-15 DIAGNOSIS — J189 Pneumonia, unspecified organism: Secondary | ICD-10-CM | POA: Diagnosis not present

## 2020-10-15 LAB — MAGNESIUM: Magnesium: 2.5 mg/dL — ABNORMAL HIGH (ref 1.7–2.4)

## 2020-10-15 LAB — RENAL FUNCTION PANEL
Albumin: 2.6 g/dL — ABNORMAL LOW (ref 3.5–5.0)
Anion gap: 12 (ref 5–15)
BUN: 22 mg/dL — ABNORMAL HIGH (ref 6–20)
CO2: 32 mmol/L (ref 22–32)
Calcium: 8.5 mg/dL — ABNORMAL LOW (ref 8.9–10.3)
Chloride: 99 mmol/L (ref 98–111)
Creatinine, Ser: 0.42 mg/dL — ABNORMAL LOW (ref 0.44–1.00)
GFR, Estimated: >60 mL/min (ref >60)
Glucose, Bld: 128 mg/dL — ABNORMAL HIGH (ref 70–99)
Phosphorus: 2.8 mg/dL (ref 2.5–4.6)
Potassium: 4.3 mmol/L (ref 3.5–5.1)
Sodium: 143 mmol/L (ref 135–145)

## 2020-10-15 IMAGING — DX DG CHEST 1V PORT
1 series · 1 of 1 positions shown · non-contrast
Comparison: [DATE] chest radiograph and prior studies

CLINICAL DATA: Pneumonia and pleural effusion. Metastatic breast
cancer.

EXAM:
PORTABLE CHEST 1 VIEW

[chest ap]
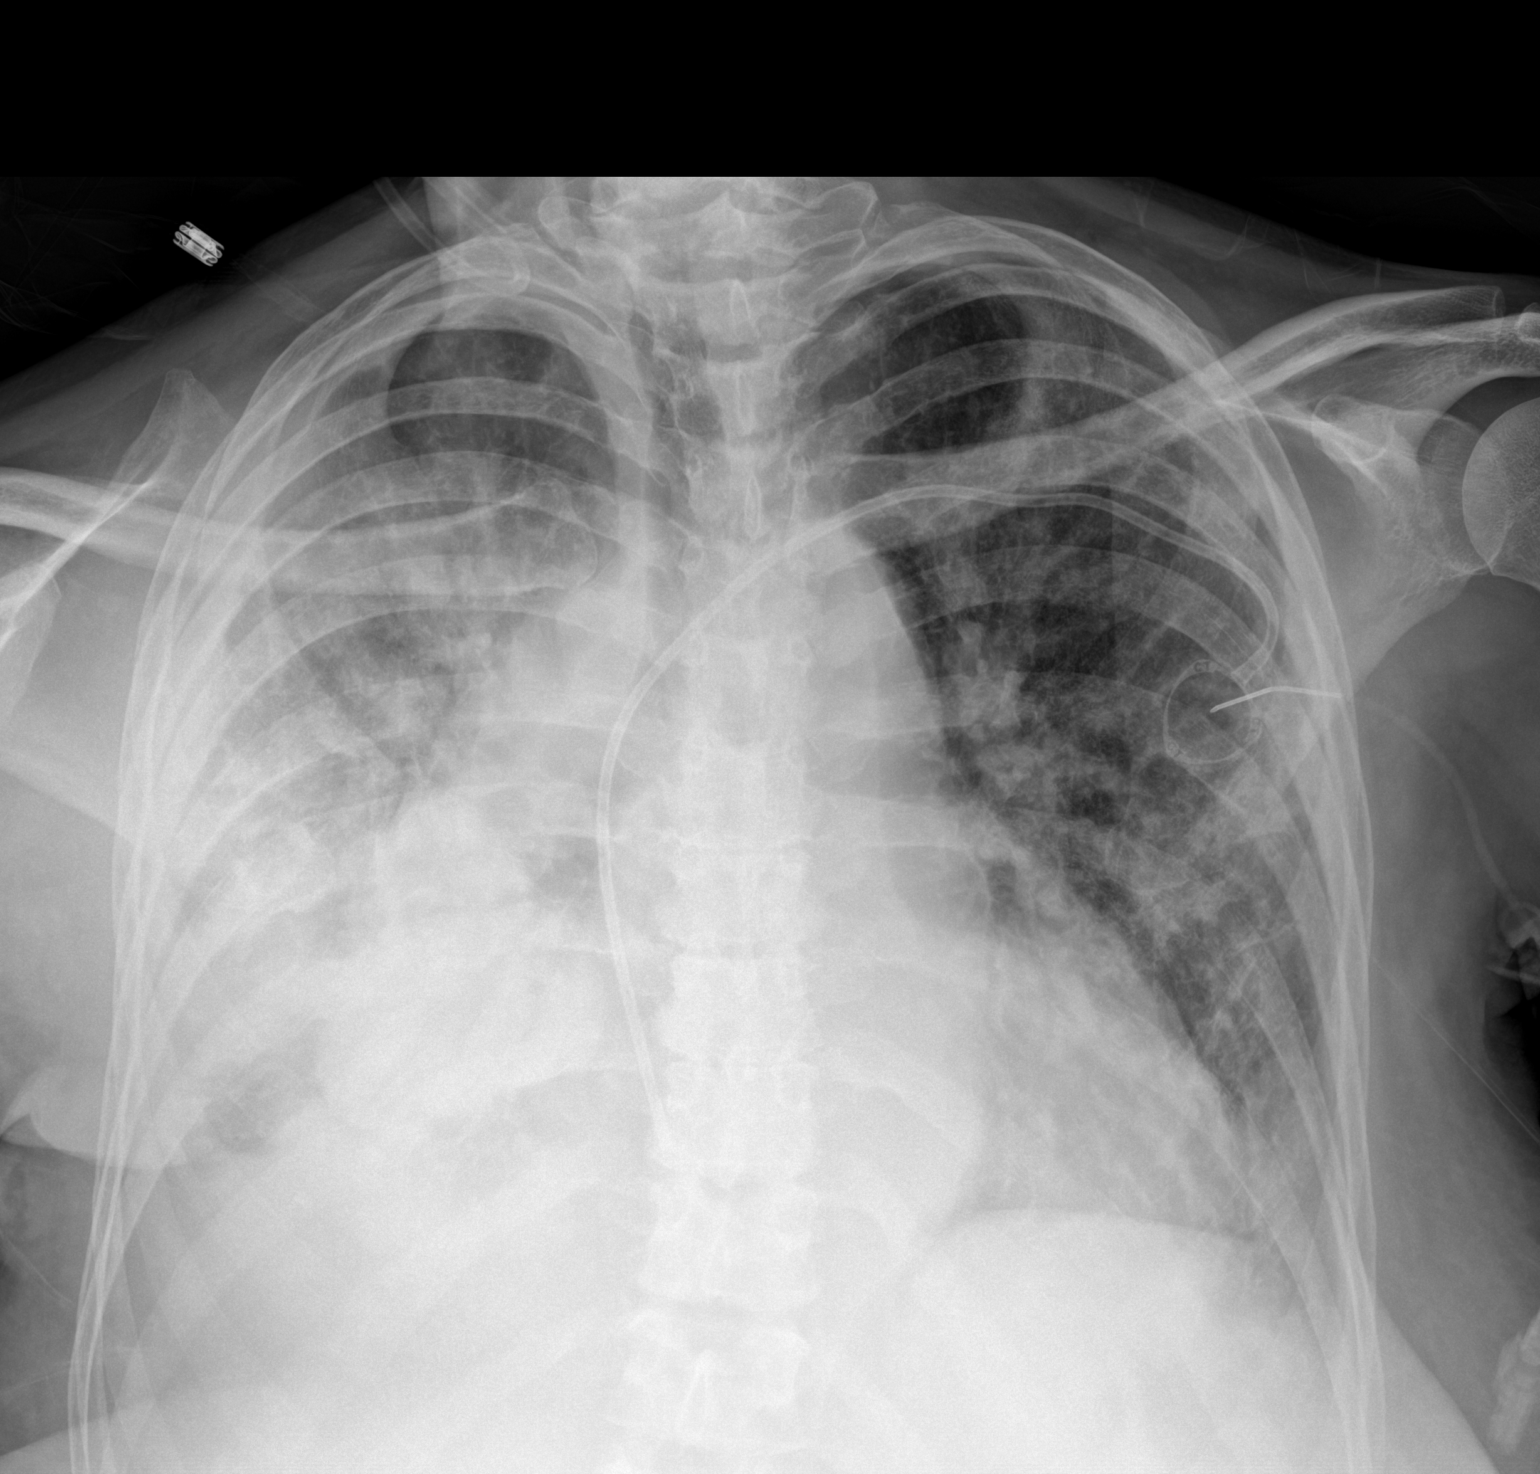

[1 of 1 positions shown; findings below may reference images not displayed]

FINDINGS: Cardiomediastinal silhouette is unchanged with RIGHT mediastinal
fullness.

A LEFT subclavian Port-A-Cath with tip overlying the UPPER RIGHT
atrium again noted.

Scattered opacities within both lungs are again noted with a RIGHT
pleural effusion.

There is no evidence of pneumothorax.

No significant changes are identified.
IMPRESSION: Unchanged appearance of the chest with scattered opacities within
both lungs and RIGHT pleural effusion.

## 2020-10-15 MED ORDER — ALBUTEROL SULFATE (2.5 MG/3ML) 0.083% IN NEBU
2.5000 mg | INHALATION_SOLUTION | Freq: Four times a day (QID) | RESPIRATORY_TRACT | Status: DC | PRN
  Administered 2020-10-16: 2.5 mg via RESPIRATORY_TRACT
  Filled 2020-10-15: qty 3

## 2020-10-15 NOTE — Consult Note (Signed)
PHARMACY CONSULT NOTE  Pharmacy Consult for Electrolyte Monitoring and Replacement   Recent Labs: Potassium (mmol/L)  Date Value  10/15/2020 4.3   Magnesium (mg/dL)  Date Value  10/15/2020 2.5 (H)   Calcium (mg/dL)  Date Value  10/15/2020 8.5 (L)   Albumin (g/dL)  Date Value  10/15/2020 2.6 (L)   Phosphorus (mg/dL)  Date Value  10/15/2020 2.8   Sodium (mmol/L)  Date Value  10/15/2020 143   Assessment: Patient is a 55 y/o F with medical history including metastatic breast cancer on chemotherapy, tachycardia, anemia, GERD, malignant pleural effusion, hemothorax who was admitted 2/25 with respiratory failure secondary to pneumonia versus worsening metastatic disease. Pharmacy consulted to assist with electrolyte monitoring and replacement as indicated.   Per chart review, appetite appears poor.   Diuresis: IV Lasix 40 mg daily  Goal of Therapy:  Electrolytes within normal limits  Plan:  3/4 AM labs: Na 143, K 4.3  Mag 2.5, Phos 2.8 Scr 0.42 Co2 32 - currently on lasix, Mag PO 64mg  daily, KCL 20 meq PO daily -no replacement at this time --Will follow-up electrolytes with AM labs  Wes Lezotte A 10/15/2020 10:28 AM

## 2020-10-15 NOTE — Progress Notes (Signed)
   10/14/20 2300  Assess: MEWS Score  Temp 98 F (36.7 C)  BP 117/72  Pulse Rate (!) 116  Resp 18  SpO2 92 %  Assess: MEWS Score  MEWS Temp 0  MEWS Systolic 0  MEWS Pulse 2  MEWS RR 0  MEWS LOC 0  MEWS Score 2  MEWS Score Color Yellow  Assess: if the MEWS score is Yellow or Red  Were vital signs taken at a resting state? Yes  Focused Assessment No change from prior assessment  Early Detection of Sepsis Score *See Row Information* Medium  MEWS guidelines implemented *See Row Information* Yes  Treat  MEWS Interventions Administered scheduled meds/treatments  Pain Scale 0-10  Patients response to intervention Effective  Take Vital Signs  Increase Vital Sign Frequency  Yellow: Q 2hr X 2 then Q 4hr X 2, if remains yellow, continue Q 4hrs  Escalate  MEWS: Escalate Yellow: discuss with charge nurse/RN and consider discussing with provider and RRT  Notify: Charge Nurse/RN  Name of Charge Nurse/RN Notified [place]  Date Charge Nurse/RN Notified 10/14/20  Time Charge Nurse/RN Notified 2330  Document  Patient Outcome Stabilized after interventions

## 2020-10-15 NOTE — Progress Notes (Signed)
PROGRESS NOTE  Jessica Willis TKP:[id number] DOB: [birth year] DOA: 10/07/2020 PCP: [place]  Brief History   55 year old woman PMH stage IV metastatic breast cancer on chemotherapy, malignant pleural effusion, hemothorax presented with shortness of breath.  Admitted for acute hypoxic respiratory failure, sepsis thought secondary to pneumonia complicated by metastatic breast cancer. Seen by pulmonology, ID, oncology and PMT. Plan is to continue aggressive treatment, started chemotherapy 3/4.  A & P  Acute hypoxic respiratory failure secondary to pneumonia versus metastatic disease/lymphangitic spread of cancer progression.  Sepsis considered on admission.  Extensive airspace abnormalities of the right lung and loculated pleural effusion, infectious versus malignant. --Remained stable on 4 to 5 L nasal cannula.  On day 9 of antibiotics, changed to oral.  Stage IV metastatic fungating breast cancer with large necrotizing mass, high tumor burden, progressed on weekly Taxol last dose 09/15/2020 --Started chemotherapy 3/4.  Fungating right breast mass with drainage --Continue wound care, goal directed towards observing drainage and decreasing discomfort.  Iron deficiency anemia  --Stable.  Chronic tachycardia --Continue beta-blocker  Goals of care --disease not curable, refractory to first-line chemotherapy, aggressive in nature.  Palliative care following.  Guarded prognosis.  Disposition Plan:  Discussion: will continue current care, prognosis appears guarded  Status is: Inpatient  Remains inpatient appropriate because:IV treatments appropriate due to intensity of illness or inability to take PO and Inpatient level of care appropriate due to severity of illness   Dispo: The patient is from: Home              Anticipated d/c is to: TBD              Patient currently is not medically stable to d/c.   Difficult to place patient No  DVT prophylaxis: enoxaparin  (LOVENOX) injection 40 mg Start: 10/07/20 2200   Code Status: DNR Level of care: Progressive Cardiac Family Communication: none  Murray Hodgkins, MD  [facility] Direct contact: see [email or web address] (further directions at bottom of note if needed) 7PM-7AM contact night coverage as at bottom of note 10/15/2020, 4:00 PM  LOS: 8 days   Significant Hospital Events   .    Consults:  . Oncology . PMT . pulmonology   Procedures:  .   Significant Diagnostic Tests:  Marland Kitchen    Micro Data:  .    Antimicrobials:  .   Interval History/Subjective  CC: f/u SOB  Feels ok today, breathing ok.  Objective   Vitals:  Vitals:   10/15/20 0758 10/15/20 1215  BP: 122/73 96/68  Pulse: (!) 106 89  Resp: 18 18  Temp: 97.7 F (36.5 C) 97.8 F (36.6 C)  SpO2: 91% 100%    Exam:  Constitutional:   . Appears calm and comfortable ENMT:  . grossly normal hearing  Respiratory:  . CTA bilaterally, no w/r/r.  . Respiratory effort mildly increased . On 5L [place] Cardiovascular:  . RRR, no m/r/g . No LE extremity edema   Psychiatric:  . Mental status o Mood, affect appropriate  I have personally reviewed the following:   Today's Data  . BMP noted  Scheduled Meds: . amoxicillin-clavulanate  1 tablet Oral Q12H  . calcium carbonate  1,250 mg Oral Q breakfast  . Chlorhexidine Gluconate Cloth  6 each Topical Daily  . senna  2 tablet Oral BID   And  . docusate sodium  100 mg Oral BID  . enoxaparin (LOVENOX) injection  40 mg Subcutaneous Q24H  . feeding supplement  237 mL  Oral TID BM  . ferrous sulfate  325 mg Oral BID WC  . furosemide  40 mg Intravenous Daily  . magnesium chloride  1 tablet Oral Daily  . melatonin  5 mg Oral QHS  . methylPREDNISolone (SOLU-MEDROL) injection  20 mg Intravenous Q12H  . metoprolol tartrate  50 mg Oral BID  . multivitamin with minerals  1 tablet Oral Daily  . potassium chloride  20 mEq Oral Daily  . sodium chloride flush  10-40 mL Intracatheter Q12H    Continuous Infusions:   Principal Problem:   HCAP (healthcare-associated pneumonia) Active Problems:   Sepsis ([place])   Acute respiratory failure with hypoxia (HCC)   Metastatic breast cancer (HCC)   Tachycardia   Iron deficiency anemia   LOS: 8 days   How to contact the [facility] Attending or Consulting provider [place] or covering provider during after hours [place], for this patient?  1. Check the care team in [facility] and look for a) attending/consulting [facility] provider listed and b) the [facility] team listed 2. Log into [email or web address] and use [place]'s universal password to access. If you do not have the password, please contact the hospital operator. 3. Locate the [facility] provider you are looking for under [facility] and page to a number that you can be directly reached. 4. If you still have difficulty reaching the provider, please page the [facility] (Director on Call) for the Hospitalists listed on amion for assistance.

## 2020-10-15 NOTE — Progress Notes (Signed)
PT Cancellation Note  Patient Details Name: Jessica Willis MRN: [id number] DOB: April 25, 1966   Cancelled Treatment:    Reason Eval/Treat Not Completed: Other (comment).  Too tired to get OOB and will retry at another time.   Ramond Dial 10/15/2020, 12:00 PM   Mee Hives, PT MS Acute Rehab Dept. Number: [place] and [place]

## 2020-10-15 NOTE — Progress Notes (Signed)
OT Cancellation Note  Patient Details Name: Azariyah Luhrs MRN: [id number] DOB: September 24, 1965   Cancelled Treatment:    Reason Eval/Treat Not Completed: Patient declined, no reason specified (Pt took heart meds -do not feel good- wants to hold off) pt ask OT this am to come back at 3pm to see if feels better after taking her heart medication - but not feeling good yet - ask to come back tomorrow   Rosalyn Gess OTR/L,CLT 10/15/2020, 3:52 PM

## 2020-10-15 NOTE — Progress Notes (Signed)
Pulmonary Medicine          Date: 10/15/2020,   MRN# [id number] Jessica Willis Jun 26, 1966     AdmissionWeight: 77.6 kg                 CurrentWeight: 79.1 kg   Referring physician: Dr Tasia Catchings   CHIEF COMPLAINT:      HISTORY OF PRESENT ILLNESS   Jessica Willis is a 55 y.o. female with medical history significant of stage IV metastasized breast cancer on chemotherapy, tachycardia, GERD, anemia, malignant pleural effusion, hemothorax, who presents with shortness of breath. In ER initally presentedwith cough and sob. She had massive rt pleural effusion with collapse of rt lung . She also had rt breast mass with changes in skin color for  6 months . She underwent thoracenteiss and it was metastatic adenocarcinoma. MRI of the brain showed scattered metastatic implants upper cervical spine.  She had a port placement and also underwent incisional biopsy of the rt breast mass on 06/28/20.  She got a dose of taxol while inpatient and was discharged on 07/01/20. She followed up with oncologist as OP, on palliative chemotherapy with weekly taxol.  Has been having tachycardia and SOB- saw Cardiology on 09/26/20 Prolonged QT and tachycardia. Pt was found to have WBC 22.3, negative Covid PCR, lactic acid 2.3, INR 1.3, PTT 36, electrolytes renal function okay, temperature 99.5, blood pressure 138/80, heart rate 134, RR 20, oxygen saturation 87% on room air, which improved her to 100% on 2 L oxygen. PCCM consultation placed due to complicated respiratory status with metastatic lung cancer and large malignant effusion. CT chest below shows pleural effusion bilaterally worse on right with metastatic lesions visible on bony spine from thoracic spine down and significant hilar and mediastinal lymphadneopathy suggestive of widespread mets.   10/13/20- patient reports improved breathing, weaned O2 to 4L [place].  She is diuresing well which ishelping her breathing.  She responded well to steroids,  inflammatory markers are elevated likely due to active malignancy and CAP(although most of this is likely fluid and unclear how much is actual infection).  She feels strong enough to get up and participate with PT.  She is using IS and able to take tidal volume 500cc today.  I have encouraged to use every hour. I met with son and reviewed care plan.   10/14/20-  Patient had onc tx today. She felt onset of SOB few hours after coming back however vitals stable.   10/15/20- patient had incresed O2 req this am to 5L/min.  I will repeat CXR today.  She was able to eat this am, states overnight she felt worsening SOB post therapy yesterday. Shes making adequate urine overnight, will keep lasix at 40.  Will dc albumin as this can sometimes cause pulm edema and will keep low dose bid solumedrol 20 for now.     PAST MEDICAL HISTORY   Past Medical History:  Diagnosis Date  . Anemia   . Family history of breast cancer   . Patient denies medical problems      SURGICAL HISTORY   Past Surgical History:  Procedure Laterality Date  . BREAST BIOPSY  06/28/2020   Procedure: BREAST BIOPSY;  Surgeon: Benjamine Sprague, DO;  Location: [facility] ORS;  Service: General;;  . PORTACATH PLACEMENT N/A 06/28/2020   Procedure: INSERTION PORT-A-CATH;  Surgeon: Benjamine Sprague, DO;  Location: [facility] ORS;  Service: General;  Laterality: N/A;  . TUBAL LIGATION    . VIDEO ASSISTED THORACOSCOPY (VATS)/THOROCOTOMY  Right 06/23/2020   Procedure: ATTEMPTED VIDEO ASSISTED THORACOSCOPY (VATS);  Surgeon: Nestor Lewandowsky, MD;  Location: [facility] ORS;  Service: General;  Laterality: Right;     FAMILY HISTORY   Family History  Problem Relation Age of Onset  . Diabetes Other   . Hypertension Other   . Diabetes Maternal Aunt   . Breast cancer Cousin        dx 16s  . Breast cancer Cousin        dx 69s     SOCIAL HISTORY   Social History   Tobacco Use  . Smoking status: Never Smoker  . Smokeless tobacco: Never Used  Vaping Use  .  Vaping Use: Never used  Substance Use Topics  . Alcohol use: Not Currently    Alcohol/week: 1.0 standard drink    Types: 1 Cans of beer per week  . Drug use: No     MEDICATIONS    Home Medication:    Current Medication:  Current Facility-Administered Medications:  .  acetaminophen (TYLENOL) tablet 650 mg, 650 mg, Oral, Q6H PRN, Ivor Costa, MD, 650 mg at 10/09/20 2144 .  albumin human 25 % solution 12.5 g, 12.5 g, Intravenous, Daily, Aleskerov, Fuad, MD, Last Rate: 60 mL/hr at 10/14/20 1821, 12.5 g at 10/14/20 1821 .  albuterol (PROVENTIL) (2.5 MG/3ML) 0.083% nebulizer solution 2.5 mg, 2.5 mg, Nebulization, Q6H PRN, Samuella Cota, MD .  albuterol (VENTOLIN HFA) 108 (90 Willis) MCG/ACT inhaler 2 puff, 2 puff, Inhalation, Q4H PRN, Ivor Costa, MD .  amoxicillin-clavulanate (AUGMENTIN) 875-125 MG per tablet 1 tablet, 1 tablet, Oral, Q12H, Ottie Glazier, MD, 1 tablet at 10/14/20 2245 .  calcium carbonate (OS-CAL - dosed in mg of elemental calcium) tablet 1,250 mg, 1,250 mg, Oral, Q breakfast, Ivor Costa, MD, 1,250 mg at 10/14/20 0900 .  Chlorhexidine Gluconate Cloth 2 % PADS 6 each, 6 each, Topical, Daily, Sharen Hones, MD, 6 each at 10/13/20 1001 .  dextromethorphan-guaiFENesin ([place] DM) 30-600 MG per 12 hr tablet 1 tablet, 1 tablet, Oral, BID PRN, Ivor Costa, MD .  senna (SENOKOT) tablet 17.2 mg, 2 tablet, Oral, BID, 17.2 mg at 10/14/20 2245 **AND** docusate sodium (COLACE) capsule 100 mg, 100 mg, Oral, BID, Samuella Cota, MD, 100 mg at 10/14/20 2242 .  enoxaparin (LOVENOX) injection 40 mg, 40 mg, Subcutaneous, Q24H, Ivor Costa, MD, 40 mg at 10/14/20 2245 .  feeding supplement (ENSURE ENLIVE / ENSURE PLUS) liquid 237 mL, 237 mL, Oral, TID BM, Sharen Hones, MD, 237 mL at 10/14/20 2245 .  ferrous sulfate tablet 325 mg, 325 mg, Oral, BID WC, Ivor Costa, MD, 325 mg at 10/14/20 1701 .  furosemide (LASIX) injection 40 mg, 40 mg, Intravenous, Daily, Ottie Glazier, MD, 40 mg at  10/14/20 0951 .  lidocaine-prilocaine (EMLA) cream 1 application, 1 application, Topical, PRN, Ivor Costa, MD .  magnesium chloride (SLOW-MAG) 64 MG SR tablet 64 mg, 1 tablet, Oral, Daily, Ivor Costa, MD, 64 mg at 10/14/20 0951 .  melatonin tablet 5 mg, 5 mg, Oral, QHS, Sharen Hones, MD, 5 mg at 10/14/20 2245 .  methylPREDNISolone sodium succinate (SOLU-MEDROL) 40 mg/mL injection 20 mg, 20 mg, Intravenous, Q12H, Lanney Gins, Fuad, MD, 20 mg at 10/15/20 0604 .  metoprolol tartrate (LOPRESSOR) tablet 50 mg, 50 mg, Oral, BID, Ivor Costa, MD, 50 mg at 10/14/20 2246 .  multivitamin with minerals tablet 1 tablet, 1 tablet, Oral, Daily, Samuella Cota, MD, 1 tablet at 10/14/20 0950 .  ondansetron (ZOFRAN) injection 4 mg,  4 mg, Intravenous, Q8H PRN, Ivor Costa, MD .  potassium chloride SA (KLOR-CON) CR tablet 20 mEq, 20 mEq, Oral, Daily, Earlie Server, MD .  simethicone [facility]) chewable tablet 80 mg, 80 mg, Oral, Daily PRN, Earlie Server, MD .  sodium chloride (OCEAN) 0.65 % nasal spray 1 spray, 1 spray, Each Nare, PRN, Samuella Cota, MD .  sodium chloride flush (NS) 0.9 % injection 10-40 mL, 10-40 mL, Intracatheter, Q12H, Sharen Hones, MD, 10 mL at 10/15/20 0057 .  sodium chloride flush (NS) 0.9 % injection 10-40 mL, 10-40 mL, Intracatheter, PRN, Sharen Hones, MD, 10 mL at 10/10/20 1447    ALLERGIES   Patient has no known allergies.     REVIEW OF SYSTEMS    Review of Systems:  Gen:  Denies  fever, sweats, chills weigh loss  HEENT: Denies blurred vision, double vision, ear pain, eye pain, hearing loss, nose bleeds, sore throat Cardiac:  No dizziness, chest pain or heaviness, chest tightness,edema Resp:   Denies cough or sputum porduction, shortness of breath,wheezing, hemoptysis,  Gi: Denies swallowing difficulty, stomach pain, nausea or vomiting, diarrhea, constipation, bowel incontinence Gu:  Denies bladder incontinence, burning urine Ext:   Denies Joint pain, stiffness or swelling Skin:  Denies  skin rash, easy bruising or bleeding or hives Endoc:  Denies polyuria, polydipsia , polyphagia or weight change Psych:   Denies depression, insomnia or hallucinations   Other:  All other systems negative   VS: BP 122/73 (BP Location: Left Arm)   Pulse (!) 106   Temp 97.7 F (36.5 C)   Resp 18   Ht 5' 10"  (1.778 m)   Wt 79.1 kg   LMP 06/16/2015 Comment: Tubal Ligation   SpO2 91%   BMI 25.01 kg/m      PHYSICAL EXAM    GENERAL:NAD, no fevers, chills, no weakness no fatigue HEAD: Normocephalic, atraumatic.  EYES: Pupils equal, round, reactive to light. Extraocular muscles intact. No scleral icterus.  MOUTH: Moist mucosal membrane. Dentition intact. No abscess noted.  EAR, NOSE, THROAT: Clear without exudates. No external lesions.  NECK: Supple. No thyromegaly. No nodules. No JVD.  PULMONARY: Diffuse coarse rhonchi right sided +wheezes CARDIOVASCULAR: S1 and S2. Regular rate and rhythm. No murmurs, rubs, or gallops. No edema. Pedal pulses 2+ bilaterally.  GASTROINTESTINAL: Soft, nontender, nondistended. No masses. Positive bowel sounds. No hepatosplenomegaly.  MUSCULOSKELETAL: No swelling, clubbing, or edema. Range of motion full in all extremities.  NEUROLOGIC: Cranial nerves II through XII are intact. No gross focal neurological deficits. Sensation intact. Reflexes intact.  SKIN: No ulceration, lesions, rashes, or cyanosis. Skin warm and dry. Turgor intact.  PSYCHIATRIC: Mood, affect within normal limits. The patient is awake, alert and oriented x 3. Insight, judgment intact.       IMAGING    DG Chest 2 View  Result Date: 10/07/2020 CLINICAL DATA:  Hypoxia, shortness of breath, metastatic breast cancer EXAM: CHEST - 2 VIEW COMPARISON:  09/13/2020 FINDINGS: LEFT subclavian Port-A-Cath with tip projecting over RIGHT atrium. Enlargement of cardiac silhouette. Pulmonary vascular congestion. Persistent enlargement of RIGHT hilum question mass or adenopathy. Increased  infiltrate of the mid to lower RIGHT lung. Small RIGHT pleural effusion again seen. Accentuation of markings in LEFT perihilar region little changed. No pneumothorax or acute osseous findings. IMPRESSION: Increased RIGHT perihilar infiltrate with persistent small RIGHT pleural effusion. Enlargement of RIGHT pulmonary hilum by adenopathy versus perihilar mass. Electronically Signed   By: Lavonia Dana M.D.   On: 10/07/2020 11:32   CT  Angio Chest PE W and/or Wo Contrast  Result Date: 10/07/2020 CLINICAL DATA:  Breast carcinoma. Tachycardia and shortness of breath EXAM: CT ANGIOGRAPHY CHEST WITH CONTRAST TECHNIQUE: Multidetector CT imaging of the chest was performed using the standard protocol during bolus administration of intravenous contrast. Multiplanar CT image reconstructions and MIPs were obtained to evaluate the vascular anatomy. CONTRAST:  31m OMNIPAQUE IOHEXOL 350 MG/ML SOLN COMPARISON:  CT angiogram chest June 15, 2020; chest radiograph October 07, 2020 FINDINGS: Cardiovascular: There is no evident pulmonary embolus. No appreciable thoracic aortic aneurysm or dissection. Visualized great vessels appear unremarkable. There is no appreciable pericardial effusion or pericardial thickening. Port-A-Cath tip is in the superior vena cava near the cavoatrial junction. Mediastinum/Nodes: Thyroid appears normal. There are multiple axillary lymph nodes bilaterally, more severe on the right than on the left. The largest lymph node is seen in the right axillary region measuring 2.7 x 2.1 cm. There is adenopathy in the right supraclavicular and infraclavicular regions with extension of adenopathy into the right Peri carinal region. Largest individual lymph node in these areas measures 2.6 x 1.8 cm. Several left supraclavicular lymph nodes are also noted, largest measuring 1.7 x 1.3 cm. There are several subcentimeter mediastinal lymph nodes. There is extensive retrocrural adenopathy. The largest lymph node or  collection of matted lymph nodes is seen inferior to the aorta on the left posteriorly measuring 3.4 x 2.1 cm. No esophageal lesions are evident. Lungs/Pleura: There is extensive airspace consolidation in portions of the right middle and lower lobes with interspersed areas of loculated pleural effusion. There is ill-defined airspace opacity consistent with pneumonia in the left upper lobe. Loculated fluid tracks along the left major fissure. There may well be associated metastasis within the fissure given the extensive nodularity in this area. There is a small left pleural effusion with left Willis atelectasis. Pleural metastases noted along each upper hemithorax, primarily posteriorly as well as in the right apex. Upper Abdomen: Retrocrural adenopathy extends into the upper abdominal region. Visualized upper abdominal structures otherwise appear unremarkable. Musculoskeletal: There is a large mass occupying the right breast and chest wall region with invasion of the deep muscles of the chest wall on the right. There is also invasion of the right axilla. There is extensive subcutaneous thickening in both breast regions. There is a mass in the lateral left breast with extension of soft tissue prominence along the lateral left hemithorax, likely due to extension of neoplasm into the adjacent soft tissues. There is widespread sclerotic bony metastatic disease throughout the thoracic region. Review of the MIP images confirms the above findings. IMPRESSION: 1. No demonstrable pulmonary embolus. No thoracic aortic aneurysm or dissection. 2. Areas of airspace consolidation noted in the right middle and lower lobes. More ill-defined infiltrate is noted in the left upper lobe anteriorly. Areas of loculated fluid noted in the left major fissure. Nodularity in this area could indicate superimposed masses in left major fissure. There is pleural thickening along both posterior upper hemithorax regions, likely pleural metastases.  Loculated fluid noted in areas of infiltrate on the right. Small left pleural effusion noted. 3. Adenopathy at multiple sites, most severe in the retrocrural regions bilaterally, right supraclavicular region, and right axillary region, although there is left axillary and left supraclavicular adenopathy. 4. Large mass lesions occupying the right breast with extension and invasion of the chest wall and right axillary regions. A lesser degree of metastatic disease to the left of midline in the subcutaneous tissues and lateral left breast/lateral left chest  wall soft tissues noted. 5. Multifocal sclerotic bony metastatic disease throughout the thoracic region noted. Electronically Signed   By: Lowella Grip III M.D.   On: 10/07/2020 13:23   US Venous Img Upper Uni Right(DVT)  Result Date: 10/08/2020 CLINICAL DATA:  History of stage IV breast cancer now with right upper extremity pain and edema. Evaluate for DVT. EXAM: RIGHT UPPER EXTREMITY VENOUS DOPPLER ULTRASOUND TECHNIQUE: Gray-scale sonography with graded compression, as well as color Doppler and duplex ultrasound were performed to evaluate the upper extremity deep venous system from the level of the subclavian vein and including the jugular, axillary, basilic, radial, ulnar and upper cephalic vein. Spectral Doppler was utilized to evaluate flow at rest and with distal augmentation maneuvers. COMPARISON:  None. FINDINGS: Contralateral Subclavian Vein: Respiratory phasicity is normal and symmetric with the symptomatic side. No evidence of thrombus. Normal compressibility. Internal Jugular Vein: No evidence of thrombus. Normal compressibility, respiratory phasicity and response to augmentation. Subclavian Vein: No evidence of thrombus. Normal compressibility, respiratory phasicity and response to augmentation. Axillary Vein: Appears patent where imaged though evaluation degraded secondary to patient's discomfort with sonographic evaluation of the axilla.  Cephalic Vein: No evidence of thrombus. Normal compressibility, respiratory phasicity and response to augmentation. Basilic Vein: No evidence of thrombus. Normal compressibility, respiratory phasicity and response to augmentation. Brachial Veins: No evidence of thrombus within either of the paired brachial veins. Normal compressibility, respiratory phasicity and response to augmentation. Radial Veins: No evidence of thrombus. Normal compressibility, respiratory phasicity and response to augmentation. Ulnar Veins: No evidence of thrombus. Normal compressibility, respiratory phasicity and response to augmentation. Venous Reflux:  None visualized. Other Findings: There is a moderate amount of subcutaneous edema at the level of the right upper arm. IMPRESSION: No evidence of DVT within the right upper extremity. Electronically Signed   By: Sandi Mariscal M.D.   On: 10/08/2020 14:29   DG Chest Port 1 View  Result Date: 10/13/2020 CLINICAL DATA:  Pleural effusion EXAM: PORTABLE CHEST 1 VIEW COMPARISON:  10/07/2020 FINDINGS: Unchanged positioning of left-sided Port-A-Cath. Stable cardiomediastinal contours. Persistent small right pleural effusion. Slight progression of airspace opacity throughout the right lung most pronounced within the right lung Willis. Similar interstitial opacities throughout the left lung. No pneumothorax. IMPRESSION: 1. Slight progression of airspace opacity throughout the right lung, most pronounced within the right lung Willis. 2. Persistent small right pleural effusion. Electronically Signed   By: Davina Poke D.O.   On: 10/13/2020 14:10   ECHOCARDIOGRAM COMPLETE  Result Date: 10/10/2020    ECHOCARDIOGRAM REPORT   Patient Name:   Jessica Willis Date of Exam: 10/10/2020 Medical Rec #:  [id number]           Height:       70.0 in Accession #:    [id number]          Weight:       178.7 lb Date of Birth:  04/25/1966           BSA:          1.990 m Patient Age:    48 years            BP:            119/79 mmHg Patient Gender: F                   HR:           105 bpm. Exam Location:  [facility] Procedure: 2D Echo, Limited Color Doppler and  Cardiac Doppler Indications:     R06.03 Acute Respiratory Distress  History:         Patient has no prior history of Echocardiogram examinations.                  Signs/Symptoms:Shortness of Breath. Breast cancer.  Sonographer:     Charmayne Sheer RDCS (AE) Referring Phys:  [id number] Sharen Hones Diagnosing Phys: Kathlyn Sacramento MD  Sonographer Comments: Technically challenging study due to limited acoustic windows. IMPRESSIONS  1. Left ventricular ejection fraction, by estimation, is 50 to 55%. The left ventricle has low normal function. Left ventricular endocardial border not optimally defined to evaluate regional wall motion. Left ventricular diastolic parameters are indeterminate.  2. Right ventricular systolic function is normal. The right ventricular size is normal. Tricuspid regurgitation signal is inadequate for assessing PA pressure.  3. The mitral valve is normal in structure. No evidence of mitral valve regurgitation. No evidence of mitral stenosis.  4. The aortic valve is normal in structure. Aortic valve regurgitation is not visualized. No aortic stenosis is present.  5. The inferior vena cava is normal in size with greater than 50% respiratory variability, suggesting right atrial pressure of 3 mmHg. FINDINGS  Left Ventricle: Left ventricular ejection fraction, by estimation, is 50 to 55%. The left ventricle has low normal function. Left ventricular endocardial border not optimally defined to evaluate regional wall motion. The left ventricular internal cavity  size was normal in size. There is no left ventricular hypertrophy. Left ventricular diastolic parameters are indeterminate. Right Ventricle: The right ventricular size is normal. No increase in right ventricular wall thickness. Right ventricular systolic function is normal. Tricuspid regurgitation signal is inadequate  for assessing PA pressure. Left Atrium: Left atrial size was normal in size. Right Atrium: Right atrial size was normal in size. Pericardium: There is no evidence of pericardial effusion. Mitral Valve: The mitral valve is normal in structure. No evidence of mitral valve regurgitation. No evidence of mitral valve stenosis. MV peak gradient, 4.5 mmHg. The mean mitral valve gradient is 2.0 mmHg. Tricuspid Valve: The tricuspid valve is normal in structure. Tricuspid valve regurgitation is not demonstrated. No evidence of tricuspid stenosis. Aortic Valve: The aortic valve is normal in structure. Aortic valve regurgitation is not visualized. No aortic stenosis is present. Aortic valve mean gradient measures 4.0 mmHg. Aortic valve peak gradient measures 6.8 mmHg. Pulmonic Valve: The pulmonic valve was not well visualized. Pulmonic valve regurgitation is not visualized. No evidence of pulmonic stenosis. Aorta: The aortic root is normal in size and structure. Venous: The inferior vena cava is normal in size with greater than 50% respiratory variability, suggesting right atrial pressure of 3 mmHg. IAS/Shunts: No atrial level shunt detected by color flow Doppler.   LV Volumes (MOD) LV vol d, MOD A4C: 99.4 ml Diastology LV vol s, MOD A4C: 58.3 ml LV e' lateral:   10.30 cm/s LV SV MOD A4C:     99.4 ml LV E/e' lateral: 5.8  LEFT ATRIUM           Index LA Vol (A4C): 41.5 ml 20.86 ml/m  AORTIC VALVE AV Vmax:           130.00 cm/s AV Vmean:          93.400 cm/s AV VTI:            0.243 m AV Peak Grad:      6.8 mmHg AV Mean Grad:      4.0 mmHg LVOT Vmax:  92.70 cm/s LVOT Vmean:        60.500 cm/s LVOT VTI:          0.151 m LVOT/AV VTI ratio: 0.62 MITRAL VALVE MV Area (PHT): 6.96 cm    SHUNTS MV Peak grad:  4.5 mmHg    Systemic VTI: 0.15 m MV Mean grad:  2.0 mmHg MV Vmax:       1.06 m/s MV Vmean:      67.9 cm/s MV Decel Time: 109 msec MV E velocity: 59.60 cm/s MV A velocity: 68.60 cm/s MV E/A ratio:  0.87 Kathlyn Sacramento MD  Electronically signed by Kathlyn Sacramento MD Signature Date/Time: 10/10/2020/2:00:06 PM    Final       ASSESSMENT/PLAN   Malignant pleural effusion    - since patient had initial therapeutic response to thoracentesis she will likely benefit from permament tunneled pleural catheter via IR service as palliative option for malignant effusion due to high probability of rapid re-accumulation    -there is no renal failure, hepatic synthetic function within reference range without HASH/cirhosis, no CHF per most recent TTE   - no lymphadema  Metastatic lung cancer   -Currently being followed by oncology Dr Tasia Catchings - appreciate input     Compressive atelectasis   Worse at Right lowe lobe - patient is weak and unlikely to be able to perform well with incentive spirometry.  She is a good candidate for albuterol infused Metaneb therapy can do BID to help with recruitment.     Chronic deconditioning and severe protein calorie malnutrition     -nutritional and PT evaluation     -palliative care on case - appreciate input     -bitemporal wasting noted on examination     - high risk refeeding syndrome - monitor electorlytes - will place consult for pharmD and RD   Acute hypoxemic respiratory failure    - patient generally not on supplemental O2 currently on 7L/min supplemental O2>>4>>5- 10/15/20    - she has leukocytosis but gram stain is negative on pleural fluid and there is no obvious infiltrate although basis bilaterally may have pneumonia, this is obscured by effusion and atelectatic sement.  Legionella and strep pneumo ag is negative. I feel that CAP is less likely.  Have ordered iflammatory markers and PCT.      - lasix to help with residual effusion     -Soluedrol 20 bid IV     - lasix 40 iv daily      Hold-10/15/20- albumin 31m 25% 1 amd daily with diuresis     - chest physitherapy to recruit atelectatic lungs     - respiratory viral panel to rule out non covid viral etiology      -etiology is all  of the above conditions     - TTE with no findings to suggest CHF realted hypoxemia  -repeat cxr 10/15/20   Thank you for allowing me to participate in the care of this patient.   Patient/Family are satisfied with care plan and all questions have been answered.  This document was prepared using Dragon voice recognition software and may include unintentional dictation errors.     FOttie Glazier M.D.  [facility]

## 2020-10-16 DIAGNOSIS — J189 Pneumonia, unspecified organism: Secondary | ICD-10-CM | POA: Diagnosis not present

## 2020-10-16 DIAGNOSIS — J9601 Acute respiratory failure with hypoxia: Secondary | ICD-10-CM | POA: Diagnosis not present

## 2020-10-16 DIAGNOSIS — C50919 Malignant neoplasm of unspecified site of unspecified female breast: Secondary | ICD-10-CM | POA: Diagnosis not present

## 2020-10-16 LAB — CBC WITH DIFFERENTIAL/PLATELET
Abs Immature Granulocytes: 0.44 10*3/uL — ABNORMAL HIGH (ref 0.00–0.07)
Basophils Absolute: 0 10*3/uL (ref 0.0–0.1)
Basophils Relative: 0 %
Eosinophils Absolute: 0 10*3/uL (ref 0.0–0.5)
Eosinophils Relative: 0 %
HCT: 31.3 % — ABNORMAL LOW (ref 36.0–46.0)
Hemoglobin: 8.9 g/dL — ABNORMAL LOW (ref 12.0–15.0)
Immature Granulocytes: 2 %
Lymphocytes Relative: 3 %
Lymphs Abs: 0.7 10*3/uL (ref 0.7–4.0)
MCH: 24.6 pg — ABNORMAL LOW (ref 26.0–34.0)
MCHC: 28.4 g/dL — ABNORMAL LOW (ref 30.0–36.0)
MCV: 86.5 fL (ref 80.0–100.0)
Monocytes Absolute: 0.8 10*3/uL (ref 0.1–1.0)
Monocytes Relative: 4 %
Neutro Abs: 18.1 10*3/uL — ABNORMAL HIGH (ref 1.7–7.7)
Neutrophils Relative %: 91 %
Platelets: 354 10*3/uL (ref 150–400)
RBC: 3.62 MIL/uL — ABNORMAL LOW (ref 3.87–5.11)
RDW: 16.4 % — ABNORMAL HIGH (ref 11.5–15.5)
WBC: 20 10*3/uL — ABNORMAL HIGH (ref 4.0–10.5)
nRBC: 0.1 % (ref 0.0–0.2)

## 2020-10-16 LAB — BASIC METABOLIC PANEL
Anion gap: 10 (ref 5–15)
BUN: 26 mg/dL — ABNORMAL HIGH (ref 6–20)
CO2: 34 mmol/L — ABNORMAL HIGH (ref 22–32)
Calcium: 8.5 mg/dL — ABNORMAL LOW (ref 8.9–10.3)
Chloride: 98 mmol/L (ref 98–111)
Creatinine, Ser: 0.33 mg/dL — ABNORMAL LOW (ref 0.44–1.00)
GFR, Estimated: >60 mL/min (ref >60)
Glucose, Bld: 109 mg/dL — ABNORMAL HIGH (ref 70–99)
Potassium: 4 mmol/L (ref 3.5–5.1)
Sodium: 142 mmol/L (ref 135–145)

## 2020-10-16 MED ORDER — POLYETHYLENE GLYCOL 3350 17 G PO PACK
17.0000 g | PACK | Freq: Two times a day (BID) | ORAL | Status: DC
  Administered 2020-10-16 – 2020-10-18 (×5): 17 g via ORAL
  Filled 2020-10-16 (×7): qty 1

## 2020-10-16 MED ORDER — BISACODYL 10 MG RE SUPP: 10.0000 mg | Freq: Every day | RECTAL | Status: DC | PRN

## 2020-10-16 MED ORDER — ALBUMIN HUMAN 25 % IV SOLN
12.5000 g | Freq: Every day | INTRAVENOUS | Status: DC
  Administered 2020-10-16 – 2020-10-21 (×6): 12.5 g via INTRAVENOUS
  Filled 2020-10-16 (×8): qty 50

## 2020-10-16 MED ORDER — FUROSEMIDE 10 MG/ML IJ SOLN
40.0000 mg | Freq: Two times a day (BID) | INTRAMUSCULAR | Status: DC
  Administered 2020-10-17 – 2020-10-19 (×5): 40 mg via INTRAVENOUS
  Filled 2020-10-16 (×7): qty 4

## 2020-10-16 NOTE — Plan of Care (Signed)
Alert, oriented x4, no c/o pain, with mild tachycardia, on 4 LPM [place], able to make needs known, calm and cooperative, interactive. Plan of care as ordered, educated on use on incentive spirometer.

## 2020-10-16 NOTE — Progress Notes (Signed)
Pulmonary Medicine          Date: 10/16/2020,   MRN# [id number] Jessica Willis Mar 09, 1966     AdmissionWeight: 77.6 kg                 CurrentWeight: 81.2 kg   Referring physician: Dr Tasia Catchings   CHIEF COMPLAINT:      HISTORY OF PRESENT ILLNESS   Jessica Willis is a 55 y.o. female with medical history significant of stage IV metastasized breast cancer on chemotherapy, tachycardia, GERD, anemia, malignant pleural effusion, hemothorax, who presents with shortness of breath. In ER initally presentedwith cough and sob. She had massive rt pleural effusion with collapse of rt lung . She also had rt breast mass with changes in skin color for  6 months . She underwent thoracenteiss and it was metastatic adenocarcinoma. MRI of the brain showed scattered metastatic implants upper cervical spine.  She had a port placement and also underwent incisional biopsy of the rt breast mass on 06/28/20.  She got a dose of taxol while inpatient and was discharged on 07/01/20. She followed up with oncologist as OP, on palliative chemotherapy with weekly taxol.  Has been having tachycardia and SOB- saw Cardiology on 09/26/20 Prolonged QT and tachycardia. Pt was found to have WBC 22.3, negative Covid PCR, lactic acid 2.3, INR 1.3, PTT 36, electrolytes renal function okay, temperature 99.5, blood pressure 138/80, heart rate 134, RR 20, oxygen saturation 87% on room air, which improved her to 100% on 2 L oxygen. PCCM consultation placed due to complicated respiratory status with metastatic lung cancer and large malignant effusion. CT chest below shows pleural effusion bilaterally worse on right with metastatic lesions visible on bony spine from thoracic spine down and significant hilar and mediastinal lymphadneopathy suggestive of widespread mets.   10/13/20- patient reports improved breathing, weaned O2 to 4L [place].  She is diuresing well which ishelping her breathing.  She responded well to steroids,  inflammatory markers are elevated likely due to active malignancy and CAP(although most of this is likely fluid and unclear how much is actual infection).  She feels strong enough to get up and participate with PT.  She is using IS and able to take tidal volume 500cc today.  I have encouraged to use every hour. I met with son and reviewed care plan.   10/14/20-  Patient had onc tx today. She felt onset of SOB few hours after coming back however vitals stable.   10/15/20- patient had incresed O2 req this am to 5L/min.  I will repeat CXR today.  She was able to eat this am, states overnight she felt worsening SOB post therapy yesterday. Shes making adequate urine overnight, will keep lasix at 40.  Will dc albumin as this can sometimes cause pulm edema and will keep low dose bid solumedrol 20 for now.   10/16/20-  Patient weaned to 4L/min [place]. Reviewed CXR with improved left side still significant airspace and interstitial opacification on right. She had documented acute episode of resp distress with spO2 74%    PAST MEDICAL HISTORY   Past Medical History:  Diagnosis Date  . Anemia   . Family history of breast cancer   . Patient denies medical problems      SURGICAL HISTORY   Past Surgical History:  Procedure Laterality Date  . BREAST BIOPSY  06/28/2020   Procedure: BREAST BIOPSY;  Surgeon: Benjamine Sprague, DO;  Location: [facility] ORS;  Service: General;;  . PORTACATH PLACEMENT N/A  06/28/2020   Procedure: INSERTION PORT-A-CATH;  Surgeon: Benjamine Sprague, DO;  Location: [facility] ORS;  Service: General;  Laterality: N/A;  . TUBAL LIGATION    . VIDEO ASSISTED THORACOSCOPY (VATS)/THOROCOTOMY Right 06/23/2020   Procedure: ATTEMPTED VIDEO ASSISTED THORACOSCOPY (VATS);  Surgeon: Nestor Lewandowsky, MD;  Location: [facility] ORS;  Service: General;  Laterality: Right;     FAMILY HISTORY   Family History  Problem Relation Age of Onset  . Diabetes Other   . Hypertension Other   . Diabetes Maternal Aunt   . Breast cancer  Cousin        dx 43s  . Breast cancer Cousin        dx 32s     SOCIAL HISTORY   Social History   Tobacco Use  . Smoking status: Never Smoker  . Smokeless tobacco: Never Used  Vaping Use  . Vaping Use: Never used  Substance Use Topics  . Alcohol use: Not Currently    Alcohol/week: 1.0 standard drink    Types: 1 Cans of beer per week  . Drug use: No     MEDICATIONS    Home Medication:    Current Medication:  Current Facility-Administered Medications:  .  acetaminophen (TYLENOL) tablet 650 mg, 650 mg, Oral, Q6H PRN, Ivor Costa, MD, 650 mg at 10/09/20 2144 .  albuterol (PROVENTIL) (2.5 MG/3ML) 0.083% nebulizer solution 2.5 mg, 2.5 mg, Nebulization, Q6H PRN, Samuella Cota, MD .  albuterol (VENTOLIN HFA) 108 (90 Base) MCG/ACT inhaler 2 puff, 2 puff, Inhalation, Q4H PRN, Ivor Costa, MD .  amoxicillin-clavulanate (AUGMENTIN) 875-125 MG per tablet 1 tablet, 1 tablet, Oral, Q12H, Ottie Glazier, MD, 1 tablet at 10/16/20 0800 .  bisacodyl (DULCOLAX) suppository 10 mg, 10 mg, Rectal, Daily PRN, Samuella Cota, MD .  calcium carbonate (OS-CAL - dosed in mg of elemental calcium) tablet 1,250 mg, 1,250 mg, Oral, Q breakfast, Ivor Costa, MD, 1,250 mg at 10/16/20 0800 .  Chlorhexidine Gluconate Cloth 2 % PADS 6 each, 6 each, Topical, Daily, Sharen Hones, MD, 6 each at 10/15/20 [phone]  dextromethorphan-guaiFENesin (MUCINEX DM) 30-600 MG per 12 hr tablet 1 tablet, 1 tablet, Oral, BID PRN, Ivor Costa, MD .  senna (SENOKOT) tablet 17.2 mg, 2 tablet, Oral, BID, 17.2 mg at 10/16/20 0801 **AND** docusate sodium (COLACE) capsule 100 mg, 100 mg, Oral, BID, Samuella Cota, MD, 100 mg at 10/16/20 0801 .  enoxaparin (LOVENOX) injection 40 mg, 40 mg, Subcutaneous, Q24H, Ivor Costa, MD, 40 mg at 10/15/20 2145 .  feeding supplement (ENSURE ENLIVE / ENSURE PLUS) liquid 237 mL, 237 mL, Oral, TID BM, Sharen Hones, MD, 237 mL at 10/16/20 0957 .  ferrous sulfate tablet 325 mg, 325 mg, Oral, BID  WC, Ivor Costa, MD, 325 mg at 10/16/20 0801 .  furosemide (LASIX) injection 40 mg, 40 mg, Intravenous, Daily, Lanney Gins, Bobbe Quilter, MD, 40 mg at 10/16/20 0802 .  lidocaine-prilocaine (EMLA) cream 1 application, 1 application, Topical, PRN, Ivor Costa, MD .  magnesium chloride (SLOW-MAG) 64 MG SR tablet 64 mg, 1 tablet, Oral, Daily, Ivor Costa, MD, 64 mg at 10/15/20 0959 .  melatonin tablet 5 mg, 5 mg, Oral, QHS, Sharen Hones, MD, 5 mg at 10/15/20 2145 .  methylPREDNISolone sodium succinate (SOLU-MEDROL) 40 mg/mL injection 20 mg, 20 mg, Intravenous, Q12H, Lanney Gins, Taressa Rauh, MD, 20 mg at 10/16/20 0336 .  metoprolol tartrate (LOPRESSOR) tablet 50 mg, 50 mg, Oral, BID, Ivor Costa, MD, 50 mg at 10/16/20 0801 .  multivitamin with minerals tablet 1  tablet, 1 tablet, Oral, Daily, Samuella Cota, MD, 1 tablet at 10/16/20 0801 .  ondansetron (ZOFRAN) injection 4 mg, 4 mg, Intravenous, Q8H PRN, Ivor Costa, MD, 4 mg at 10/16/20 0027 .  polyethylene glycol (MIRALAX / GLYCOLAX) packet 17 g, 17 g, Oral, BID, Samuella Cota, MD .  potassium chloride SA (KLOR-CON) CR tablet 20 mEq, 20 mEq, Oral, Daily, Earlie Server, MD, 20 mEq at 10/16/20 0802 .  simethicone (MYLICON) chewable tablet 80 mg, 80 mg, Oral, Daily PRN, Earlie Server, MD, 80 mg at 10/15/20 1002 .  sodium chloride (OCEAN) 0.65 % nasal spray 1 spray, 1 spray, Each Nare, PRN, Samuella Cota, MD .  sodium chloride flush (NS) 0.9 % injection 10-40 mL, 10-40 mL, Intracatheter, Q12H, Sharen Hones, MD, 10 mL at 10/16/20 0803 .  sodium chloride flush (NS) 0.9 % injection 10-40 mL, 10-40 mL, Intracatheter, PRN, Sharen Hones, MD, 10 mL at 10/10/20 1447    ALLERGIES   Patient has no known allergies.     REVIEW OF SYSTEMS    Review of Systems:  Gen:  Denies  fever, sweats, chills weigh loss  HEENT: Denies blurred vision, double vision, ear pain, eye pain, hearing loss, nose bleeds, sore throat Cardiac:  No dizziness, chest pain or heaviness, chest  tightness,edema Resp:   Denies cough or sputum porduction, shortness of breath,wheezing, hemoptysis,  Gi: Denies swallowing difficulty, stomach pain, nausea or vomiting, diarrhea, constipation, bowel incontinence Gu:  Denies bladder incontinence, burning urine Ext:   Denies Joint pain, stiffness or swelling Skin: Denies  skin rash, easy bruising or bleeding or hives Endoc:  Denies polyuria, polydipsia , polyphagia or weight change Psych:   Denies depression, insomnia or hallucinations   Other:  All other systems negative   VS: BP 125/73 (BP Location: Left Arm)   Pulse (!) 109   Temp 97.9 F (36.6 C) (Oral)   Resp 18   Ht 5' 10"  (1.778 m)   Wt 81.2 kg   LMP 06/16/2015 Comment: Tubal Ligation   SpO2 (!) 74%   BMI 25.68 kg/m      PHYSICAL EXAM    GENERAL:NAD, no fevers, chills, no weakness no fatigue HEAD: Normocephalic, atraumatic.  EYES: Pupils equal, round, reactive to light. Extraocular muscles intact. No scleral icterus.  MOUTH: Moist mucosal membrane. Dentition intact. No abscess noted.  EAR, NOSE, THROAT: Clear without exudates. No external lesions.  NECK: Supple. No thyromegaly. No nodules. No JVD.  PULMONARY: Diffuse coarse rhonchi right sided +wheezes CARDIOVASCULAR: S1 and S2. Regular rate and rhythm. No murmurs, rubs, or gallops. No edema. Pedal pulses 2+ bilaterally.  GASTROINTESTINAL: Soft, nontender, nondistended. No masses. Positive bowel sounds. No hepatosplenomegaly.  MUSCULOSKELETAL: No swelling, clubbing, or edema. Range of motion full in all extremities.  NEUROLOGIC: Cranial nerves II through XII are intact. No gross focal neurological deficits. Sensation intact. Reflexes intact.  SKIN: No ulceration, lesions, rashes, or cyanosis. Skin warm and dry. Turgor intact.  PSYCHIATRIC: Mood, affect within normal limits. The patient is awake, alert and oriented x 3. Insight, judgment intact.       IMAGING    DG Chest 2 View  Result Date: 10/07/2020 CLINICAL  DATA:  Hypoxia, shortness of breath, metastatic breast cancer EXAM: CHEST - 2 VIEW COMPARISON:  09/13/2020 FINDINGS: LEFT subclavian Port-A-Cath with tip projecting over RIGHT atrium. Enlargement of cardiac silhouette. Pulmonary vascular congestion. Persistent enlargement of RIGHT hilum question mass or adenopathy. Increased infiltrate of the mid to lower RIGHT lung. Small RIGHT pleural  effusion again seen. Accentuation of markings in LEFT perihilar region little changed. No pneumothorax or acute osseous findings. IMPRESSION: Increased RIGHT perihilar infiltrate with persistent small RIGHT pleural effusion. Enlargement of RIGHT pulmonary hilum by adenopathy versus perihilar mass. Electronically Signed   By: Lavonia Dana M.D.   On: 10/07/2020 11:32   CT Angio Chest PE W and/or Wo Contrast  Result Date: 10/07/2020 CLINICAL DATA:  Breast carcinoma. Tachycardia and shortness of breath EXAM: CT ANGIOGRAPHY CHEST WITH CONTRAST TECHNIQUE: Multidetector CT imaging of the chest was performed using the standard protocol during bolus administration of intravenous contrast. Multiplanar CT image reconstructions and MIPs were obtained to evaluate the vascular anatomy. CONTRAST:  30m OMNIPAQUE IOHEXOL 350 MG/ML SOLN COMPARISON:  CT angiogram chest June 15, 2020; chest radiograph October 07, 2020 FINDINGS: Cardiovascular: There is no evident pulmonary embolus. No appreciable thoracic aortic aneurysm or dissection. Visualized great vessels appear unremarkable. There is no appreciable pericardial effusion or pericardial thickening. Port-A-Cath tip is in the superior vena cava near the cavoatrial junction. Mediastinum/Nodes: Thyroid appears normal. There are multiple axillary lymph nodes bilaterally, more severe on the right than on the left. The largest lymph node is seen in the right axillary region measuring 2.7 x 2.1 cm. There is adenopathy in the right supraclavicular and infraclavicular regions with extension of  adenopathy into the right Peri carinal region. Largest individual lymph node in these areas measures 2.6 x 1.8 cm. Several left supraclavicular lymph nodes are also noted, largest measuring 1.7 x 1.3 cm. There are several subcentimeter mediastinal lymph nodes. There is extensive retrocrural adenopathy. The largest lymph node or collection of matted lymph nodes is seen inferior to the aorta on the left posteriorly measuring 3.4 x 2.1 cm. No esophageal lesions are evident. Lungs/Pleura: There is extensive airspace consolidation in portions of the right middle and lower lobes with interspersed areas of loculated pleural effusion. There is ill-defined airspace opacity consistent with pneumonia in the left upper lobe. Loculated fluid tracks along the left major fissure. There may well be associated metastasis within the fissure given the extensive nodularity in this area. There is a small left pleural effusion with left base atelectasis. Pleural metastases noted along each upper hemithorax, primarily posteriorly as well as in the right apex. Upper Abdomen: Retrocrural adenopathy extends into the upper abdominal region. Visualized upper abdominal structures otherwise appear unremarkable. Musculoskeletal: There is a large mass occupying the right breast and chest wall region with invasion of the deep muscles of the chest wall on the right. There is also invasion of the right axilla. There is extensive subcutaneous thickening in both breast regions. There is a mass in the lateral left breast with extension of soft tissue prominence along the lateral left hemithorax, likely due to extension of neoplasm into the adjacent soft tissues. There is widespread sclerotic bony metastatic disease throughout the thoracic region. Review of the MIP images confirms the above findings. IMPRESSION: 1. No demonstrable pulmonary embolus. No thoracic aortic aneurysm or dissection. 2. Areas of airspace consolidation noted in the right middle  and lower lobes. More ill-defined infiltrate is noted in the left upper lobe anteriorly. Areas of loculated fluid noted in the left major fissure. Nodularity in this area could indicate superimposed masses in left major fissure. There is pleural thickening along both posterior upper hemithorax regions, likely pleural metastases. Loculated fluid noted in areas of infiltrate on the right. Small left pleural effusion noted. 3. Adenopathy at multiple sites, most severe in the retrocrural regions bilaterally, right  supraclavicular region, and right axillary region, although there is left axillary and left supraclavicular adenopathy. 4. Large mass lesions occupying the right breast with extension and invasion of the chest wall and right axillary regions. A lesser degree of metastatic disease to the left of midline in the subcutaneous tissues and lateral left breast/lateral left chest wall soft tissues noted. 5. Multifocal sclerotic bony metastatic disease throughout the thoracic region noted. Electronically Signed   By: Lowella Grip III M.D.   On: 10/07/2020 13:23   US Venous Img Upper Uni Right(DVT)  Result Date: 10/08/2020 CLINICAL DATA:  History of stage IV breast cancer now with right upper extremity pain and edema. Evaluate for DVT. EXAM: RIGHT UPPER EXTREMITY VENOUS DOPPLER ULTRASOUND TECHNIQUE: Gray-scale sonography with graded compression, as well as color Doppler and duplex ultrasound were performed to evaluate the upper extremity deep venous system from the level of the subclavian vein and including the jugular, axillary, basilic, radial, ulnar and upper cephalic vein. Spectral Doppler was utilized to evaluate flow at rest and with distal augmentation maneuvers. COMPARISON:  None. FINDINGS: Contralateral Subclavian Vein: Respiratory phasicity is normal and symmetric with the symptomatic side. No evidence of thrombus. Normal compressibility. Internal Jugular Vein: No evidence of thrombus. Normal  compressibility, respiratory phasicity and response to augmentation. Subclavian Vein: No evidence of thrombus. Normal compressibility, respiratory phasicity and response to augmentation. Axillary Vein: Appears patent where imaged though evaluation degraded secondary to patient's discomfort with sonographic evaluation of the axilla. Cephalic Vein: No evidence of thrombus. Normal compressibility, respiratory phasicity and response to augmentation. Basilic Vein: No evidence of thrombus. Normal compressibility, respiratory phasicity and response to augmentation. Brachial Veins: No evidence of thrombus within either of the paired brachial veins. Normal compressibility, respiratory phasicity and response to augmentation. Radial Veins: No evidence of thrombus. Normal compressibility, respiratory phasicity and response to augmentation. Ulnar Veins: No evidence of thrombus. Normal compressibility, respiratory phasicity and response to augmentation. Venous Reflux:  None visualized. Other Findings: There is a moderate amount of subcutaneous edema at the level of the right upper arm. IMPRESSION: No evidence of DVT within the right upper extremity. Electronically Signed   By: Sandi Mariscal M.D.   On: 10/08/2020 14:29   DG Chest Port 1 View  Result Date: 10/15/2020 CLINICAL DATA:  Pneumonia and pleural effusion. Metastatic breast cancer. EXAM: PORTABLE CHEST 1 VIEW COMPARISON:  10/13/2020 chest radiograph and prior studies FINDINGS: Cardiomediastinal silhouette is unchanged with RIGHT mediastinal fullness. A LEFT subclavian Port-A-Cath with tip overlying the UPPER RIGHT atrium again noted. Scattered opacities within both lungs are again noted with a RIGHT pleural effusion. There is no evidence of pneumothorax. No significant changes are identified. IMPRESSION: Unchanged appearance of the chest with scattered opacities within both lungs and RIGHT pleural effusion. Electronically Signed   By: Margarette Canada M.D.   On: 10/15/2020  11:06   DG Chest Port 1 View  Result Date: 10/13/2020 CLINICAL DATA:  Pleural effusion EXAM: PORTABLE CHEST 1 VIEW COMPARISON:  10/07/2020 FINDINGS: Unchanged positioning of left-sided Port-A-Cath. Stable cardiomediastinal contours. Persistent small right pleural effusion. Slight progression of airspace opacity throughout the right lung most pronounced within the right lung base. Similar interstitial opacities throughout the left lung. No pneumothorax. IMPRESSION: 1. Slight progression of airspace opacity throughout the right lung, most pronounced within the right lung base. 2. Persistent small right pleural effusion. Electronically Signed   By: Davina Poke D.O.   On: 10/13/2020 14:10   ECHOCARDIOGRAM COMPLETE  Result Date: 10/10/2020  ECHOCARDIOGRAM REPORT   Patient Name:   Jessica Willis Date of Exam: 10/10/2020 Medical Rec #:  [id number]           Height:       70.0 in Accession #:    [id number]          Weight:       178.7 lb Date of Birth:  03-11-1966           BSA:          1.990 m Patient Age:    7 years            BP:           119/79 mmHg Patient Gender: F                   HR:           105 bpm. Exam Location:  [facility] Procedure: 2D Echo, Limited Color Doppler and Cardiac Doppler Indications:     R06.03 Acute Respiratory Distress  History:         Patient has no prior history of Echocardiogram examinations.                  Signs/Symptoms:Shortness of Breath. Breast cancer.  Sonographer:     Charmayne Sheer RDCS (AE) Referring Phys:  [id number] Sharen Hones Diagnosing Phys: Kathlyn Sacramento MD  Sonographer Comments: Technically challenging study due to limited acoustic windows. IMPRESSIONS  1. Left ventricular ejection fraction, by estimation, is 50 to 55%. The left ventricle has low normal function. Left ventricular endocardial border not optimally defined to evaluate regional wall motion. Left ventricular diastolic parameters are indeterminate.  2. Right ventricular systolic function is normal. The  right ventricular size is normal. Tricuspid regurgitation signal is inadequate for assessing PA pressure.  3. The mitral valve is normal in structure. No evidence of mitral valve regurgitation. No evidence of mitral stenosis.  4. The aortic valve is normal in structure. Aortic valve regurgitation is not visualized. No aortic stenosis is present.  5. The inferior vena cava is normal in size with greater than 50% respiratory variability, suggesting right atrial pressure of 3 mmHg. FINDINGS  Left Ventricle: Left ventricular ejection fraction, by estimation, is 50 to 55%. The left ventricle has low normal function. Left ventricular endocardial border not optimally defined to evaluate regional wall motion. The left ventricular internal cavity  size was normal in size. There is no left ventricular hypertrophy. Left ventricular diastolic parameters are indeterminate. Right Ventricle: The right ventricular size is normal. No increase in right ventricular wall thickness. Right ventricular systolic function is normal. Tricuspid regurgitation signal is inadequate for assessing PA pressure. Left Atrium: Left atrial size was normal in size. Right Atrium: Right atrial size was normal in size. Pericardium: There is no evidence of pericardial effusion. Mitral Valve: The mitral valve is normal in structure. No evidence of mitral valve regurgitation. No evidence of mitral valve stenosis. MV peak gradient, 4.5 mmHg. The mean mitral valve gradient is 2.0 mmHg. Tricuspid Valve: The tricuspid valve is normal in structure. Tricuspid valve regurgitation is not demonstrated. No evidence of tricuspid stenosis. Aortic Valve: The aortic valve is normal in structure. Aortic valve regurgitation is not visualized. No aortic stenosis is present. Aortic valve mean gradient measures 4.0 mmHg. Aortic valve peak gradient measures 6.8 mmHg. Pulmonic Valve: The pulmonic valve was not well visualized. Pulmonic valve regurgitation is not visualized. No  evidence of pulmonic stenosis. Aorta: The aortic root is normal  in size and structure. Venous: The inferior vena cava is normal in size with greater than 50% respiratory variability, suggesting right atrial pressure of 3 mmHg. IAS/Shunts: No atrial level shunt detected by color flow Doppler.   LV Volumes (MOD) LV vol d, MOD A4C: 99.4 ml Diastology LV vol s, MOD A4C: 58.3 ml LV e' lateral:   10.30 cm/s LV SV MOD A4C:     99.4 ml LV E/e' lateral: 5.8  LEFT ATRIUM           Index LA Vol (A4C): 41.5 ml 20.86 ml/m  AORTIC VALVE AV Vmax:           130.00 cm/s AV Vmean:          93.400 cm/s AV VTI:            0.243 m AV Peak Grad:      6.8 mmHg AV Mean Grad:      4.0 mmHg LVOT Vmax:         92.70 cm/s LVOT Vmean:        60.500 cm/s LVOT VTI:          0.151 m LVOT/AV VTI ratio: 0.62 MITRAL VALVE MV Area (PHT): 6.96 cm    SHUNTS MV Peak grad:  4.5 mmHg    Systemic VTI: 0.15 m MV Mean grad:  2.0 mmHg MV Vmax:       1.06 m/s MV Vmean:      67.9 cm/s MV Decel Time: 109 msec MV E velocity: 59.60 cm/s MV A velocity: 68.60 cm/s MV E/A ratio:  0.87 Kathlyn Sacramento MD Electronically signed by Kathlyn Sacramento MD Signature Date/Time: 10/10/2020/2:00:06 PM    Final          ASSESSMENT/PLAN   Malignant pleural effusion    - since patient had initial therapeutic response to thoracentesis she will likely benefit from permament tunneled pleural catheter via IR service as palliative option for malignant effusion due to high probability of rapid re-accumulation    -there is no renal failure, hepatic synthetic function within reference range without HASH/cirhosis, no CHF per most recent TTE   - no lymphadema  Metastatic lung cancer   -Currently being followed by oncology Dr Tasia Catchings - appreciate input     Compressive atelectasis   Worse at Right lowe lobe - patient is weak and unlikely to be able to perform well with incentive spirometry.  She is a good candidate for albuterol infused Metaneb therapy can do BID to help with  recruitment.     Chronic deconditioning and severe protein calorie malnutrition     -nutritional and PT evaluation     -palliative care on case - appreciate input     -bitemporal wasting noted on examination     - high risk refeeding syndrome - monitor electorlytes - will place consult for pharmD and RD     Acute hypoxemic respiratory failure    - patient generally not on supplemental O2 currently on 7L/min supplemental O2>>4>>5- 10/15/20    - she has leukocytosis but gram stain is negative on pleural fluid and there is no obvious infiltrate although basis bilaterally may have pneumonia, this is obscured by effusion and atelectatic sement.  Legionella and strep pneumo ag is negative. I feel that CAP is less likely.  Have ordered iflammatory markers and PCT.      - lasix to help with residual effusion     -Soluedrol 20 bid IV     - lasix 40 iv daily >>  bid 10/16/20     - albumin 67m 25% 1 amd daily with diuresis     - chest physitherapy to recruit atelectatic lungs     -etiology is all of the above conditions     - TTE with no findings to suggest CHF realted hypoxemia  -repeat cxr 10/15/20- reviewed     Thank you for allowing me to participate in the care of this patient.   Patient/Family are satisfied with care plan and all questions have been answered.  This document was prepared using Dragon voice recognition software and may include unintentional dictation errors.     FOttie Glazier M.D.  Division of [facility]

## 2020-10-16 NOTE — Progress Notes (Signed)
PT Cancellation Note  Patient Details Name: Jessica Willis MRN: [id number] DOB: 04-01-1966   Cancelled Treatment:    Reason Eval/Treat Not Completed: Patient declined, no reason specified (Patient reclining in bed. States she is drinking something to help her have a BM due to abdominal pain and hopes it works soon. Declines PT at this time. Nursing reports gave her miralax to drink 2 hours ago. Will try again at later time/date as able.)   [name], PT, DPT 10/16/20, 3:39 PM

## 2020-10-16 NOTE — Progress Notes (Signed)
PROGRESS NOTE  Jessica Willis OJJ:[id number] DOB: 09-16-1965 DOA: 10/07/2020 PCP: [place]  Brief History   55 year old woman PMH stage IV metastatic breast cancer on chemotherapy, malignant pleural effusion, hemothorax presented with shortness of breath.  Admitted for acute hypoxic respiratory failure, sepsis thought secondary to pneumonia complicated by metastatic breast cancer. Seen by pulmonology, ID, oncology and PMT. Plan is to continue aggressive treatment, started chemotherapy 3/4.  A & P  Acute hypoxic respiratory failure secondary to pneumonia versus metastatic disease/lymphangitic spread of cancer progression.  Sepsis considered on admission.  Extensive airspace abnormalities of the right lung and loculated pleural effusion, infectious versus malignant. --Remains stable on 4 to 5 L nasal cannula.  On day 10 of antibiotics, changed to oral.  Stage IV metastatic fungating breast cancer with large necrotizing mass, high tumor burden, progressed on weekly Taxol last dose 09/15/2020 --Started chemotherapy 3/4.  Fungating right breast mass with drainage --Continue wound care, goal directed towards observing drainage and decreasing discomfort.  Iron deficiency anemia  --Stable.  Chronic tachycardia --Continue beta-blocker  Goals of care --disease not curable, refractory to first-line chemotherapy, aggressive in nature.  Palliative care following.  Guarded prognosis.  Disposition Plan:  Discussion: respiratory status appears stable, can transfer to med-surg, hypoxia stable. Will order bowel regimen. Discussed w/ Dr. Lanney Gins.  Status is: Inpatient  Remains inpatient appropriate because:IV treatments appropriate due to intensity of illness or inability to take PO and Inpatient level of care appropriate due to severity of illness   Dispo: The patient is from: Home              Anticipated d/c is to: TBD              Patient currently is not medically  stable to d/c.   Difficult to place patient No  DVT prophylaxis: enoxaparin (LOVENOX) injection 40 mg Start: 10/07/20 2200   Code Status: DNR Level of care: Med-Surg Family Communication: none  Murray Hodgkins, MD  [facility] Direct contact: see [email or web address] (further directions at bottom of note if needed) 7PM-7AM contact night coverage as at bottom of note 10/16/2020, 9:11 AM  LOS: 9 days   Significant Hospital Events   .    Consults:  . Oncology . PMT . pulmonology   Procedures:  .   Significant Diagnostic Tests:  Marland Kitchen    Micro Data:  .    Antimicrobials:  .   Interval History/Subjective  CC: f/u SOB  Gas overnight, feels like needs to have BM, last was 48 hours ago. No vomiting, but has nausea, was able to eat some dinner last night Breathing ok.  Objective   Vitals:  Vitals:   10/16/20 0427 10/16/20 0823  BP: 114/72 125/73  Pulse: (!) 104 (!) 109  Resp: 17 18  Temp: 97.6 F (36.4 C) 97.9 F (36.6 C)  SpO2: 99% (!) 74%    Exam:  Constitutional:   . Appears calm and comfortable ENMT:  . grossly normal hearing  Respiratory:  . CTA bilaterally, no w/r/r. Fair air movement . Respiratory effort mildly increased Cardiovascular:  . RRR, no m/r/g . No LE extremity edema   Abdomen:  . Soft, ntnd, positive BS Psychiatric:  . Mental status o Mood, affect appropriate  I have personally reviewed the following:   Today's Data  . BMP noted, stable  Scheduled Meds: . amoxicillin-clavulanate  1 tablet Oral Q12H  . calcium carbonate  1,250 mg Oral Q breakfast  . Chlorhexidine Gluconate Cloth  6 each Topical Daily  . senna  2 tablet Oral BID   And  . docusate sodium  100 mg Oral BID  . enoxaparin (LOVENOX) injection  40 mg Subcutaneous Q24H  . feeding supplement  237 mL Oral TID BM  . ferrous sulfate  325 mg Oral BID WC  . furosemide  40 mg Intravenous Daily  . magnesium chloride  1 tablet Oral Daily  . melatonin  5 mg Oral QHS  .  methylPREDNISolone (SOLU-MEDROL) injection  20 mg Intravenous Q12H  . metoprolol tartrate  50 mg Oral BID  . multivitamin with minerals  1 tablet Oral Daily  . polyethylene glycol  17 g Oral BID  . potassium chloride  20 mEq Oral Daily  . sodium chloride flush  10-40 mL Intracatheter Q12H   Continuous Infusions:   Principal Problem:   HCAP (healthcare-associated pneumonia) Active Problems:   Sepsis ([place])   Acute respiratory failure with hypoxia (HCC)   Metastatic breast cancer (HCC)   Tachycardia   Iron deficiency anemia   LOS: 9 days   How to contact the [facility] Attending or Consulting provider [place] or covering provider during after hours [place], for this patient?  1. Check the care team in [facility] and look for a) attending/consulting [facility] provider listed and b) the [facility] team listed 2. Log into [email or web address] and use [place]'s universal password to access. If you do not have the password, please contact the hospital operator. 3. Locate the [facility] provider you are looking for under [facility] and page to a number that you can be directly reached. 4. If you still have difficulty reaching the provider, please page the [facility] (Director on Call) for the Hospitalists listed on amion for assistance.

## 2020-10-16 NOTE — Plan of Care (Signed)

## 2020-10-16 NOTE — Progress Notes (Signed)
[facility] Hematology/Oncology Progress Note  Date of admission: 10/07/2020  Hospital day:  10/16/2020  Chief Complaint: Conny Situ is a 55 y.o. female with metastatic triple negative breast cancer who was admitted with shortness of breath due to healthcare associated pneumonia.  Subjective: Patient feeling better.  Shortness of breath slightly improved.  She notes minimal nausea after chemotherapy resolved with antiemetics.  Social History: The patient is alone today.  Allergies: No Known Allergies  Scheduled Medications: . amoxicillin-clavulanate  1 tablet Oral Q12H  . calcium carbonate  1,250 mg Oral Q breakfast  . Chlorhexidine Gluconate Cloth  6 each Topical Daily  . senna  2 tablet Oral BID   And  . docusate sodium  100 mg Oral BID  . enoxaparin (LOVENOX) injection  40 mg Subcutaneous Q24H  . feeding supplement  237 mL Oral TID BM  . ferrous sulfate  325 mg Oral BID WC  . furosemide  40 mg Intravenous BID  . magnesium chloride  1 tablet Oral Daily  . melatonin  5 mg Oral QHS  . methylPREDNISolone (SOLU-MEDROL) injection  20 mg Intravenous Q12H  . metoprolol tartrate  50 mg Oral BID  . multivitamin with minerals  1 tablet Oral Daily  . polyethylene glycol  17 g Oral BID  . potassium chloride  20 mEq Oral Daily  . sodium chloride flush  10-40 mL Intracatheter Q12H    Review of Systems: GENERAL:  Fatigue.  No fevers or sweats. PERFORMANCE STATUS (ECOG):  2 HEENT:  No visual changes, runny nose, sore throat, mouth sores or tenderness. Lungs: Shortness of breath.  Cough.  No hemoptysis. Cardiac:  No chest pain, palpitations, orthopnea, or PND. GI:  Decreased appetite.  No nausea, vomiting, diarrhea, constipation, melena or hematochezia. GU:  No urgency, frequency, dysuria, or hematuria. Musculoskeletal:  No back pain.  No joint pain.  No muscle tenderness. Extremities:  No pain or swelling. Skin:  Right sided chest wall mass. Neuro:  No  headache, numbness or weakness, balance or coordination issues. Endocrine:  No diabetes, thyroid issues, hot flashes or night sweats. Psych:  No mood changes, depression or anxiety. Pain:  No focal pain. Review of systems:  All other systems reviewed and found to be negative.  Physical Exam: Blood pressure 125/73, pulse (!) 109, temperature 97.9 F (36.6 C), temperature source Oral, resp. rate 18, height 5\' 10"  (1.778 m), weight 179 lb (81.2 kg), last menstrual period 06/16/2015, SpO2 (!) 74 %.  GENERAL:   Fatigued appearing woman lying comfortably on the medical unit in no acute distress. MENTAL STATUS:  Alert and oriented to person, place and time. HEAD:  Normocephalic, atraumatic, face symmetric, no Cushingoid features. EYES:   Brown eyes.  Pupils equal round and reactive to light and accomodation.  No conjunctivitis or scleral icterus. ENT:   at 4 liter/min.  Oropharynx clear without lesion.  Tongue normal. Mucous membranes moist.  RESPIRATORY:  Clear to auscultation anteriorly.  Decreased breath sounds right side.  No rales, wheezes or rhonchi. CARDIOVASCULAR:  Tachycardic.  Regular rate and rhythm without murmur, rub or gallop. CHEST WALL:  Large upper anterior right chest wall dressing. ABDOMEN:  Soft, non-tender, with active bowel sounds, and no hepatosplenomegaly.  No masses. SKIN:  No rashes, ulcers or lesions. EXTREMITIES: No edema, no skin discoloration or tenderness.  No palpable cords. LYMPH NODES: Palpable small supraclavicular adenopathy.  NEUROLOGICAL: Unremarkable. PSYCH:  Appropriate.   Results for orders placed or performed during the hospital encounter of  10/07/20 (from the past 48 hour(s))  Renal function panel     Status: Abnormal   Collection Time: 10/15/20  6:08 AM  Result Value Ref Range   Sodium 143 135 - 145 mmol/L   Potassium 4.3 3.5 - 5.1 mmol/L   Chloride 99 98 - 111 mmol/L   CO2 32 22 - 32 mmol/L   Glucose, Bld 128 (H) 70 - 99 mg/dL    Comment:  Glucose reference range applies only to samples taken after fasting for at least 8 hours.   BUN 22 (H) 6 - 20 mg/dL   Creatinine, Ser 0.42 (L) 0.44 - 1.00 mg/dL   Calcium 8.5 (L) 8.9 - 10.3 mg/dL   Phosphorus 2.8 2.5 - 4.6 mg/dL   Albumin 2.6 (L) 3.5 - 5.0 g/dL   GFR, Estimated >60 >60 mL/min    Comment: (NOTE) Calculated using the CKD-EPI Creatinine Equation (2021)    Anion gap 12 5 - 15    Comment: Performed at [facility], [address], [place], [place] [address]  Magnesium     Status: Abnormal   Collection Time: 10/15/20  6:08 AM  Result Value Ref Range   Magnesium 2.5 (H) 1.7 - 2.4 mg/dL    Comment: Performed at [facility], [address], [place], [place] [address]  Basic metabolic panel     Status: Abnormal   Collection Time: 10/16/20  4:24 AM  Result Value Ref Range   Sodium 142 135 - 145 mmol/L   Potassium 4.0 3.5 - 5.1 mmol/L   Chloride 98 98 - 111 mmol/L   CO2 34 (H) 22 - 32 mmol/L   Glucose, Bld 109 (H) 70 - 99 mg/dL    Comment: Glucose reference range applies only to samples taken after fasting for at least 8 hours.   BUN 26 (H) 6 - 20 mg/dL   Creatinine, Ser 0.33 (L) 0.44 - 1.00 mg/dL   Calcium 8.5 (L) 8.9 - 10.3 mg/dL   GFR, Estimated >60 >60 mL/min    Comment: (NOTE) Calculated using the CKD-EPI Creatinine Equation (2021)    Anion gap 10 5 - 15    Comment: Performed at [facility], [address], [place], [place] [address]   DG Chest Port 1 View  Result Date: 10/15/2020 CLINICAL DATA:  Pneumonia and pleural effusion. Metastatic breast cancer. EXAM: PORTABLE CHEST 1 VIEW COMPARISON:  10/13/2020 chest radiograph and prior studies FINDINGS: Cardiomediastinal silhouette is unchanged with RIGHT mediastinal fullness. A LEFT subclavian Port-A-Cath with tip overlying the UPPER RIGHT atrium again noted. Scattered opacities within both lungs are again noted with a RIGHT pleural effusion. There is no evidence of pneumothorax. No  significant changes are identified. IMPRESSION: Unchanged appearance of the chest with scattered opacities within both lungs and RIGHT pleural effusion. Electronically Signed   By: Margarette Canada M.D.   On: 10/15/2020 11:06    Assessment:  Altovise Wahler is a 55 y.o. female with metastatic triple negative breast cancer admitted with shortness of breath and health care associated pneumonia.  PET scan on 07/19/2020 revealed metastatic breast cancer involving the right breast/chest wall, bilateral mediastinal, hilar and axillary lymph nodes, left hemithorax and bones.  There was a hypermetabolic lesion in the pancreatic tail which may represent a metastasis or primary lesion.  Chest CT angiogram on 10/07/2020 revealed no pulmonary embolus. There were areas of airspace consolidation noted in the right middle and lower lobes. More ill-defined infiltrate was noted in the left upper lobe anteriorly.  Areas of loculated fluid in the left major fissure. Nodularity in this area could indicate superimposed masses in left major fissure. There was pleural thickening along both posterior upper hemithorax regions, likely pleural metastases. Loculated fluid noted in areas of infiltrate on the right. Small left pleural effusion.  There was adenopathy in the retrocrural regions bilaterally, right supraclavicular region, and right axillary region.  There was left axillary and left supraclavicular adenopathy.  A large mass lesion occupying the right breast with extension and invasion of the chest wall and right axillary regions. A lesser degree of metastatic disease to the left of midline in the subcutaneous tissues and lateral left breast/lateral left chest wall soft tissues.  There was multifocal sclerotic bony metastatic disease throughout the thoracic region.  She was initially treated with Taxol.  She is day 3 s/p cycle #1 of palliative adriamycin.  CXR on 10/14/2020 revealed slight progression of airspace opacity  throughout the right lung most pronounced within the right lung base.  There was similar similar interstitial opacities to the left lung.  There was no pneumothorax.  Symptomatically, she is doing fairly well.  She denies any increased shortness of breath.  Nausea is well controlled.  She has a large fungating right upper chest wall mass.  Plan: 1. Metastatic triple negative breast cancer  Patient progressed on weekly Taxol.  She is day 3 s/p cycle #1 adriamycin.   Treatment is palliative.  Nausea is well controlled.  Continue to monitor. 2. Acute hypoxemic respiratory failure  Etiology likely multifactorial (pneumonia, metastatic breast cancer/effusion/lymphangitic spread of disease).  Clinically, she is feeling slightly better today.  She is currently on oxygen 4 liters/min via nasal cannula.   Patient has an incentive spirometer.  Unasyn has been switched to Augmentin.  3. Wound care  Patient has a large fungating right chest wall mass.  Continue wound care. 4. General debilitation  Patient would like to begin to work with PT.  She notes significant shortness of breath with last attempt to use the restroom. 5. Code status  DNR per chart.   Lequita Asal, MD  10/16/2020, 10:43 AM

## 2020-10-16 NOTE — Consult Note (Signed)
PHARMACY CONSULT NOTE  Pharmacy Consult for Electrolyte Monitoring and Replacement   Recent Labs: Potassium (mmol/L)  Date Value  10/16/2020 4.0   Magnesium (mg/dL)  Date Value  10/15/2020 2.5 (H)   Calcium (mg/dL)  Date Value  10/16/2020 8.5 (L)   Albumin (g/dL)  Date Value  10/15/2020 2.6 (L)   Phosphorus (mg/dL)  Date Value  10/15/2020 2.8   Sodium (mmol/L)  Date Value  10/16/2020 142   Assessment: Patient is a 55 y/o F with medical history including metastatic breast cancer on chemotherapy, tachycardia, anemia, GERD, malignant pleural effusion, hemothorax who was admitted 2/25 with respiratory failure secondary to pneumonia versus worsening metastatic disease. Pharmacy consulted to assist with electrolyte monitoring and replacement as indicated.   Per chart review, appetite appears poor.   Diuresis: IV Lasix 40 mg daily  Goal of Therapy:  Electrolytes within normal limits  Plan:  3/6  Na 142, K 4.0  Scr 0.33  - currently on lasix, Mag PO 64mg  daily, KCL 20 meq PO daily -no replacement at this time --Will follow-up electrolytes with AM labs  Britt Petroni A 10/16/2020 8:27 AM

## 2020-10-16 NOTE — Progress Notes (Signed)
PT Cancellation Note  Patient Details Name: Jessica Willis MRN: [id number] DOB: 29-Jul-1966   Cancelled Treatment:    Reason Eval/Treat Not Completed: Patient declined, no reason specified (Patient declines PT at this time. States she is about to get breakfast and wants to eat and take pills first. Will re-attempt at later time/date as able.)   [name], PT, DPT 10/16/20, 9:33 AM

## 2020-10-17 DIAGNOSIS — J189 Pneumonia, unspecified organism: Secondary | ICD-10-CM | POA: Diagnosis not present

## 2020-10-17 DIAGNOSIS — T451X5D Adverse effect of antineoplastic and immunosuppressive drugs, subsequent encounter: Secondary | ICD-10-CM

## 2020-10-17 DIAGNOSIS — J9601 Acute respiratory failure with hypoxia: Secondary | ICD-10-CM | POA: Diagnosis not present

## 2020-10-17 DIAGNOSIS — T451X5A Adverse effect of antineoplastic and immunosuppressive drugs, initial encounter: Secondary | ICD-10-CM

## 2020-10-17 DIAGNOSIS — R630 Anorexia: Secondary | ICD-10-CM

## 2020-10-17 DIAGNOSIS — C50919 Malignant neoplasm of unspecified site of unspecified female breast: Secondary | ICD-10-CM | POA: Diagnosis not present

## 2020-10-17 LAB — BASIC METABOLIC PANEL
Anion gap: 10 (ref 5–15)
BUN: 26 mg/dL — ABNORMAL HIGH (ref 6–20)
CO2: 34 mmol/L — ABNORMAL HIGH (ref 22–32)
Calcium: 8.3 mg/dL — ABNORMAL LOW (ref 8.9–10.3)
Chloride: 98 mmol/L (ref 98–111)
Creatinine, Ser: 0.36 mg/dL — ABNORMAL LOW (ref 0.44–1.00)
GFR, Estimated: >60 mL/min (ref >60)
Glucose, Bld: 104 mg/dL — ABNORMAL HIGH (ref 70–99)
Potassium: 4.5 mmol/L (ref 3.5–5.1)
Sodium: 142 mmol/L (ref 135–145)

## 2020-10-17 MED ORDER — DRONABINOL 2.5 MG PO CAPS
5.0000 mg | ORAL_CAPSULE | Freq: Two times a day (BID) | ORAL | Status: DC
  Administered 2020-10-17 – 2020-10-21 (×8): 5 mg via ORAL
  Filled 2020-10-17 (×8): qty 2

## 2020-10-17 MED ORDER — SCOPOLAMINE 1 MG/3DAYS TD PT72
1.0000 | MEDICATED_PATCH | TRANSDERMAL | Status: DC
  Administered 2020-10-17 – 2020-10-20 (×2): 1.5 mg via TRANSDERMAL
  Filled 2020-10-17 (×2): qty 1

## 2020-10-17 NOTE — Plan of Care (Signed)

## 2020-10-17 NOTE — Progress Notes (Signed)
PT Cancellation Note  Patient Details Name: Jessica Willis MRN: [id number] DOB: [birth year]   Cancelled Treatment:    Reason Eval/Treat Not Completed: Patient declined, no reason specified (Evaluation re-attempted again.  Patient politely declines, as "OT was just in here with me".  Voices fatigue and requests therapist re-attempt.  Will re-attempt after lunch, time permitting.)   Limmie Schoenberg H. Owens Shark, PT, DPT, NCS 10/17/20, 11:05 AM [phone]

## 2020-10-17 NOTE — Progress Notes (Signed)
Hematology/Oncology Progress Note [facility] Telephone:([phone] [phone]  Patient Care Team: [place] as PCP - General Rico Junker, RN as Registered Nurse Benjamine Sprague, DO as Consulting Physician (Surgery)   Name of the patient: Jessica Willis  [id number]  [birth year]  Date of visit: 10/17/20   INTERVAL HISTORY-  Feels nausea, no appetite. No vomiting.  Breathing status remains same. SOB worse with minimal exertion.    Review of systems- Review of Systems  Constitutional: Positive for appetite change, fatigue and unexpected weight change. Negative for chills and fever.  HENT:   Negative for hearing loss and voice change.   Eyes: Negative for eye problems.  Respiratory: Positive for shortness of breath. Negative for chest tightness and cough.   Cardiovascular: Negative for chest pain.  Gastrointestinal: Positive for nausea. Negative for abdominal distention, abdominal pain and blood in stool.  Endocrine: Negative for hot flashes.  Genitourinary: Negative for difficulty urinating and frequency.   Musculoskeletal: Negative for arthralgias.  Skin: Negative for itching and rash.  Neurological: Negative for extremity weakness.  Hematological: Negative for adenopathy.  Psychiatric/Behavioral: Negative for confusion.    No Known Allergies  Patient Active Problem List   Diagnosis Date Noted  . HCAP (healthcare-associated pneumonia) 10/07/2020  . QT prolongation 09/22/2020  . Pneumonia due to Streptococcus pyogenes ([place]) 08/22/2020  . Genetic testing 07/27/2020  . Pancreatic lesion 07/22/2020  . Swelling of lower leg 07/13/2020  . Encounter for antineoplastic chemotherapy 07/04/2020  . Complication of chemotherapy 07/04/2020  . Family history of breast cancer   . Port-A-Cath in place   . Bone metastasis ([place])   . Hypocalcemia   . Folate deficiency   . Gastroesophageal reflux disease without esophagitis    . Weakness of right arm   . Palliative care encounter   . Iron deficiency anemia   . Anemia, chronic disease   . Tachycardia   . Metastatic breast cancer ([place])   . Malignant pleural effusion   . Chest tube in place   . Goals of care, counseling/discussion   . Hemothorax on right 06/16/2020  . Acute respiratory failure with hypoxia ([place])   . Breast mass, right 06/15/2020  . Mastitis 06/15/2020  . Pleural effusion 06/15/2020  . Intestinal obstruction ([place])   . Leukocytosis   . Hypokalemia 06/24/2015  . Sepsis ([place]) 06/23/2015  . CAP (community acquired pneumonia) 06/23/2015  . Partial small bowel obstruction ([place]) 06/23/2015     Past Medical History:  Diagnosis Date  . Anemia   . Family history of breast cancer   . Patient denies medical problems      Past Surgical History:  Procedure Laterality Date  . BREAST BIOPSY  06/28/2020   Procedure: BREAST BIOPSY;  Surgeon: Benjamine Sprague, DO;  Location: [facility] ORS;  Service: General;;  . PORTACATH PLACEMENT N/A 06/28/2020   Procedure: INSERTION PORT-A-CATH;  Surgeon: Benjamine Sprague, DO;  Location: [facility] ORS;  Service: General;  Laterality: N/A;  . TUBAL LIGATION    . VIDEO ASSISTED THORACOSCOPY (VATS)/THOROCOTOMY Right 06/23/2020   Procedure: ATTEMPTED VIDEO ASSISTED THORACOSCOPY (VATS);  Surgeon: Nestor Lewandowsky, MD;  Location: [facility] ORS;  Service: General;  Laterality: Right;    Social History   Socioeconomic History  . Marital status: Single    Spouse name: Not on file  . Number of children: Not on file  . Years of education: Not on file  . Highest education level: Not on file  Occupational History  . Not on  file  Tobacco Use  . Smoking status: Never Smoker  . Smokeless tobacco: Never Used  Vaping Use  . Vaping Use: Never used  Substance and Sexual Activity  . Alcohol use: Not Currently    Alcohol/week: 1.0 standard drink    Types: 1 Cans of beer per week  . Drug use: No  . Sexual activity: Not on file  Other Topics  Concern  . Not on file  Social History Narrative  . Not on file   Social Determinants of Health   Financial Resource Strain: Not on file  Food Insecurity: Not on file  Transportation Needs: Not on file  Physical Activity: Not on file  Stress: Not on file  Social Connections: Not on file  Intimate Partner Violence: Not on file     Family History  Problem Relation Age of Onset  . Diabetes Other   . Hypertension Other   . Diabetes Maternal Aunt   . Breast cancer Cousin        dx 78s  . Breast cancer Cousin        dx 23s     Current Facility-Administered Medications:  .  acetaminophen (TYLENOL) tablet 650 mg, 650 mg, Oral, Q6H PRN, Ivor Costa, MD, 650 mg at 10/09/20 2144 .  albumin human 25 % solution 12.5 g, 12.5 g, Intravenous, Daily, Lanney Gins, Fu[place], MD, Last Rate: 60 mL/hr at 10/17/20 0849, 12.5 g at 10/17/20 0849 .  albuterol (PROVENTIL) (2.5 MG/3ML) 0.083% nebulizer solution 2.5 mg, 2.5 mg, Nebulization, Q6H PRN, Samuella Cota, MD, 2.5 mg at 10/16/20 1612 .  albuterol (VENTOLIN HFA) 108 (90 Base) MCG/ACT inhaler 2 puff, 2 puff, Inhalation, Q4H PRN, Ivor Costa, MD .  amoxicillin-clavulanate (AUGMENTIN) 875-125 MG per tablet 1 tablet, 1 tablet, Oral, Q12H, Ottie Glazier, MD, 1 tablet at 10/17/20 1884 .  bisacodyl (DULCOLAX) suppository 10 mg, 10 mg, Rectal, Daily PRN, Samuella Cota, MD .  calcium carbonate (OS-CAL - dosed in mg of elemental calcium) tablet 1,250 mg, 1,250 mg, Oral, Q breakfast, Ivor Costa, MD, 1,250 mg at 10/17/20 1660 .  Chlorhexidine Gluconate Cloth 2 % PADS 6 each, 6 each, Topical, Daily, Sharen Hones, MD, 6 each at 10/17/20 [phone] .  dextromethorphan-guaiFENesin (MUCINEX DM) 30-600 MG per 12 hr tablet 1 tablet, 1 tablet, Oral, BID PRN, Ivor Costa, MD .  senna (SENOKOT) tablet 17.2 mg, 2 tablet, Oral, BID, 17.2 mg at 10/17/20 1046 **AND** docusate sodium (COLACE) capsule 100 mg, 100 mg, Oral, BID, Samuella Cota, MD, 100 mg at 10/17/20 6010 .   dronabinol (MARINOL) capsule 5 mg, 5 mg, Oral, BID AC, Earlie Server, MD .  enoxaparin (LOVENOX) injection 40 mg, 40 mg, Subcutaneous, Q24H, Ivor Costa, MD, 40 mg at 10/16/20 2114 .  feeding supplement (ENSURE ENLIVE / ENSURE PLUS) liquid 237 mL, 237 mL, Oral, TID BM, Sharen Hones, MD, 237 mL at 10/17/20 1309 .  ferrous sulfate tablet 325 mg, 325 mg, Oral, BID WC, Ivor Costa, MD, 325 mg at 10/17/20 9323 .  furosemide (LASIX) injection 40 mg, 40 mg, Intravenous, BID, Ottie Glazier, MD, 40 mg at 10/17/20 0829 .  lidocaine-prilocaine (EMLA) cream 1 application, 1 application, Topical, PRN, Ivor Costa, MD .  magnesium chloride (SLOW-MAG) 64 MG SR tablet 64 mg, 1 tablet, Oral, Daily, Ivor Costa, MD, 64 mg at 10/17/20 5573 .  melatonin tablet 5 mg, 5 mg, Oral, QHS, Sharen Hones, MD, 5 mg at 10/16/20 2114 .  methylPREDNISolone sodium succinate (SOLU-MEDROL) 40 mg/mL injection 20  mg, 20 mg, Intravenous, Q12H, Ottie Glazier, MD, 20 mg at 10/17/20 0211 .  metoprolol tartrate (LOPRESSOR) tablet 50 mg, 50 mg, Oral, BID, Ivor Costa, MD, 50 mg at 10/16/20 2114 .  multivitamin with minerals tablet 1 tablet, 1 tablet, Oral, Daily, Samuella Cota, MD, 1 tablet at 10/17/20 7124 .  ondansetron (ZOFRAN) injection 4 mg, 4 mg, Intravenous, Q8H PRN, Ivor Costa, MD, 4 mg at 10/17/20 1309 .  polyethylene glycol (MIRALAX / GLYCOLAX) packet 17 g, 17 g, Oral, BID, Samuella Cota, MD, 17 g at 10/17/20 5809 .  potassium chloride SA (KLOR-CON) CR tablet 20 mEq, 20 mEq, Oral, Daily, Earlie Server, MD, 20 mEq at 10/17/20 9833 .  scopolamine (TRANSDERM-SCOP) 1 MG/3DAYS 1.5 mg, 1 patch, Transdermal, Q72H, Earlie Server, MD .  simethicone [facility]) chewable tablet 80 mg, 80 mg, Oral, Daily PRN, Earlie Server, MD, 80 mg at 10/15/20 1002 .  sodium chloride (OCEAN) 0.65 % nasal spray 1 spray, 1 spray, Each Nare, PRN, Samuella Cota, MD .  sodium chloride flush (NS) 0.9 % injection 10-40 mL, 10-40 mL, Intracatheter, Q12H, Sharen Hones, MD, 10  mL at 10/17/20 0824 .  sodium chloride flush (NS) 0.9 % injection 10-40 mL, 10-40 mL, Intracatheter, PRN, Sharen Hones, MD, 10 mL at 10/10/20 1447   Physical exam:  Vitals:   10/16/20 2321 10/17/20 0459 10/17/20 0814 10/17/20 1121  BP: 101/66 112/64 113/68 114/70  Pulse: (!) 105 99 (!) 105 (!) 110  Resp: 16 16 19 19   Temp: 98.1 F (36.7 C) 97.9 F (36.6 C) 98.3 F (36.8 C) 97.9 F (36.6 C)  TempSrc: Oral Oral Oral Oral  SpO2: 95% 100% 94% 95%  Weight:      Height:       Physical Exam Constitutional:      General: Jessica Willis not in acute distress.    Appearance: Jessica Willis not diaphoretic.  HENT:     Head: Normocephalic and atraumatic.     Nose: Nose normal.     Mouth/Throat:     Pharynx: No oropharyngeal exudate.  Eyes:     General: No scleral icterus.    Pupils: Pupils are equal, round, and reactive to light.  Cardiovascular:     Rate and Rhythm: Normal rate and regular rhythm.     Heart sounds: No murmur heard.   Pulmonary:     Effort: Pulmonary effort Willis normal. No respiratory distress.     Comments: Decreased right lower base breath sound Rhonchi Abdominal:     General: There Willis no distension.     Palpations: Abdomen Willis soft.     Tenderness: There Willis no abdominal tenderness.  Musculoskeletal:        General: Normal range of motion.     Cervical back: Normal range of motion and neck supple.  Skin:    General: Skin Willis warm and dry.     Findings: No erythema.  Neurological:     Mental Status: Jessica Willis alert and oriented to person, place, and time.     Cranial Nerves: No cranial nerve deficit.     Motor: No abnormal muscle tone.     Coordination: Coordination normal.  Psychiatric:        Mood and Affect: Affect normal.       CMP Latest Ref Rng & Units 10/17/2020  Glucose 70 - 99 mg/dL 104(H)  BUN 6 - 20 mg/dL 26(H)  Creatinine 0.44 - 1.00 mg/dL 0.36(L)  Sodium 135 - 145 mmol/L 142  Potassium 3.5 - 5.1 mmol/L 4.5  Chloride 98 - 111 mmol/L 98  CO2 22 - 32  mmol/L 34(H)  Calcium 8.9 - 10.3 mg/dL 8.3(L)  Total Protein 6.5 - 8.1 g/dL -  Total Bilirubin 0.3 - 1.2 mg/dL -  Alkaline Phos 38 - 126 U/L -  AST 15 - 41 U/L -  ALT 0 - 44 U/L -   CBC Latest Ref Rng & Units 10/16/2020  WBC 4.0 - 10.5 K/uL 20.0(H)  Hemoglobin 12.0 - 15.0 g/dL 8.9(L)  Hematocrit 36.0 - 46.0 % 31.3(L)  Platelets 150 - 400 K/uL 354    RADIOGRAPHIC STUDIES: I have personally reviewed the radiological images as listed and agreed with the findings in the report. DG Chest 2 View  Result Date: 10/07/2020 CLINICAL DATA:  Hypoxia, shortness of breath, metastatic breast cancer EXAM: CHEST - 2 VIEW COMPARISON:  09/13/2020 FINDINGS: LEFT subclavian Port-A-Cath with tip projecting over RIGHT atrium. Enlargement of cardiac silhouette. Pulmonary vascular congestion. Persistent enlargement of RIGHT hilum question mass or adenopathy. Increased infiltrate of the mid to lower RIGHT lung. Small RIGHT pleural effusion again seen. Accentuation of markings in LEFT perihilar region little changed. No pneumothorax or acute osseous findings. IMPRESSION: Increased RIGHT perihilar infiltrate with persistent small RIGHT pleural effusion. Enlargement of RIGHT pulmonary hilum by adenopathy versus perihilar mass. Electronically Signed   By: Lavonia Dana M.D.   On: 10/07/2020 11:32   CT Angio Chest PE W and/or Wo Contrast  Result Date: 10/07/2020 CLINICAL DATA:  Breast carcinoma. Tachycardia and shortness of breath EXAM: CT ANGIOGRAPHY CHEST WITH CONTRAST TECHNIQUE: Multidetector CT imaging of the chest was performed using the standard protocol during bolus administration of intravenous contrast. Multiplanar CT image reconstructions and MIPs were obtained to evaluate the vascular anatomy. CONTRAST:  35mL OMNIPAQUE IOHEXOL 350 MG/ML SOLN COMPARISON:  CT angiogram chest June 15, 2020; chest radiograph October 07, 2020 FINDINGS: Cardiovascular: There Willis no evident pulmonary embolus. No appreciable thoracic  aortic aneurysm or dissection. Visualized great vessels appear unremarkable. There Willis no appreciable pericardial effusion or pericardial thickening. Port-A-Cath tip Willis in the superior vena cava near the cavoatrial junction. Mediastinum/Nodes: Thyroid appears normal. There are multiple axillary lymph nodes bilaterally, more severe on the right than on the left. The largest lymph node Willis seen in the right axillary region measuring 2.7 x 2.1 cm. There Willis adenopathy in the right supraclavicular and infraclavicular regions with extension of adenopathy into the right Peri carinal region. Largest individual lymph node in these areas measures 2.6 x 1.8 cm. Several left supraclavicular lymph nodes are also noted, largest measuring 1.7 x 1.3 cm. There are several subcentimeter mediastinal lymph nodes. There Willis extensive retrocrural adenopathy. The largest lymph node or collection of matted lymph nodes Willis seen inferior to the aorta on the left posteriorly measuring 3.4 x 2.1 cm. No esophageal lesions are evident. Lungs/Pleura: There Willis extensive airspace consolidation in portions of the right middle and lower lobes with interspersed areas of loculated pleural effusion. There Willis ill-defined airspace opacity consistent with pneumonia in the left upper lobe. Loculated fluid tracks along the left major fissure. There may well be associated metastasis within the fissure given the extensive nodularity in this area. There Willis a small left pleural effusion with left base atelectasis. Pleural metastases noted along each upper hemithorax, primarily posteriorly as well as in the right apex. Upper Abdomen: Retrocrural adenopathy extends into the upper abdominal region. Visualized upper abdominal structures otherwise appear unremarkable. Musculoskeletal: There Willis a large mass  occupying the right breast and chest wall region with invasion of the deep muscles of the chest wall on the right. There Willis also invasion of the right axilla. There Willis  extensive subcutaneous thickening in both breast regions. There Willis a mass in the lateral left breast with extension of soft tissue prominence along the lateral left hemithorax, likely due to extension of neoplasm into the adjacent soft tissues. There Willis widespread sclerotic bony metastatic disease throughout the thoracic region. Review of the MIP images confirms the above findings. IMPRESSION: 1. No demonstrable pulmonary embolus. No thoracic aortic aneurysm or dissection. 2. Areas of airspace consolidation noted in the right middle and lower lobes. More ill-defined infiltrate Willis noted in the left upper lobe anteriorly. Areas of loculated fluid noted in the left major fissure. Nodularity in this area could indicate superimposed masses in left major fissure. There Willis pleural thickening along both posterior upper hemithorax regions, likely pleural metastases. Loculated fluid noted in areas of infiltrate on the right. Small left pleural effusion noted. 3. Adenopathy at multiple sites, most severe in the retrocrural regions bilaterally, right supraclavicular region, and right axillary region, although there Willis left axillary and left supraclavicular adenopathy. 4. Large mass lesions occupying the right breast with extension and invasion of the chest wall and right axillary regions. A lesser degree of metastatic disease to the left of midline in the subcutaneous tissues and lateral left breast/lateral left chest wall soft tissues noted. 5. Multifocal sclerotic bony metastatic disease throughout the thoracic region noted. Electronically Signed   By: Lowella Grip III M.D.   On: 10/07/2020 13:23   US Venous Img Upper Uni Right(DVT)  Result Date: 10/08/2020 CLINICAL DATA:  History of stage IV breast cancer now with right upper extremity pain and edema. Evaluate for DVT. EXAM: RIGHT UPPER EXTREMITY VENOUS DOPPLER ULTRASOUND TECHNIQUE: Gray-scale sonography with graded compression, as well as color Doppler and duplex  ultrasound were performed to evaluate the upper extremity deep venous system from the level of the subclavian vein and including the jugular, axillary, basilic, radial, ulnar and upper cephalic vein. Spectral Doppler was utilized to evaluate flow at rest and with distal augmentation maneuvers. COMPARISON:  None. FINDINGS: Contralateral Subclavian Vein: Respiratory phasicity Willis normal and symmetric with the symptomatic side. No evidence of thrombus. Normal compressibility. Internal Jugular Vein: No evidence of thrombus. Normal compressibility, respiratory phasicity and response to augmentation. Subclavian Vein: No evidence of thrombus. Normal compressibility, respiratory phasicity and response to augmentation. Axillary Vein: Appears patent where imaged though evaluation degraded secondary to patient's discomfort with sonographic evaluation of the axilla. Cephalic Vein: No evidence of thrombus. Normal compressibility, respiratory phasicity and response to augmentation. Basilic Vein: No evidence of thrombus. Normal compressibility, respiratory phasicity and response to augmentation. Brachial Veins: No evidence of thrombus within either of the paired brachial veins. Normal compressibility, respiratory phasicity and response to augmentation. Radial Veins: No evidence of thrombus. Normal compressibility, respiratory phasicity and response to augmentation. Ulnar Veins: No evidence of thrombus. Normal compressibility, respiratory phasicity and response to augmentation. Venous Reflux:  None visualized. Other Findings: There Willis a moderate amount of subcutaneous edema at the level of the right upper arm. IMPRESSION: No evidence of DVT within the right upper extremity. Electronically Signed   By: Sandi Mariscal M.D.   On: 10/08/2020 14:29   DG Chest Port 1 View  Result Date: 10/15/2020 CLINICAL DATA:  Pneumonia and pleural effusion. Metastatic breast cancer. EXAM: PORTABLE CHEST 1 VIEW COMPARISON:  10/13/2020 chest radiograph  and  prior studies FINDINGS: Cardiomediastinal silhouette Willis unchanged with RIGHT mediastinal fullness. A LEFT subclavian Port-A-Cath with tip overlying the UPPER RIGHT atrium again noted. Scattered opacities within both lungs are again noted with a RIGHT pleural effusion. There Willis no evidence of pneumothorax. No significant changes are identified. IMPRESSION: Unchanged appearance of the chest with scattered opacities within both lungs and RIGHT pleural effusion. Electronically Signed   By: Margarette Canada M.D.   On: 10/15/2020 11:06   DG Chest Port 1 View  Result Date: 10/13/2020 CLINICAL DATA:  Pleural effusion EXAM: PORTABLE CHEST 1 VIEW COMPARISON:  10/07/2020 FINDINGS: Unchanged positioning of left-sided Port-A-Cath. Stable cardiomediastinal contours. Persistent small right pleural effusion. Slight progression of airspace opacity throughout the right lung most pronounced within the right lung base. Similar interstitial opacities throughout the left lung. No pneumothorax. IMPRESSION: 1. Slight progression of airspace opacity throughout the right lung, most pronounced within the right lung base. 2. Persistent small right pleural effusion. Electronically Signed   By: Davina Poke D.O.   On: 10/13/2020 14:10   ECHOCARDIOGRAM COMPLETE  Result Date: 10/10/2020    ECHOCARDIOGRAM REPORT   Patient Name:   MCKINLEIGH SCHUCHART Date of Exam: 10/10/2020 Medical Rec #:  [id number]           Height:       70.0 in Accession #:    [id number]          Weight:       178.7 lb Date of Birth:  1965/11/26           BSA:          1.990 m Patient Age:    15 years            BP:           119/79 mmHg Patient Gender: F                   HR:           105 bpm. Exam Location:  [facility] Procedure: 2D Echo, Limited Color Doppler and Cardiac Doppler Indications:     R06.03 Acute Respiratory Distress  History:         Patient has no prior history of Echocardiogram examinations.                  Signs/Symptoms:Shortness of Breath. Breast  cancer.  Sonographer:     Charmayne Sheer RDCS (AE) Referring Phys:  [id number] Sharen Hones Diagnosing Phys: Kathlyn Sacramento MD  Sonographer Comments: Technically challenging study due to limited acoustic windows. IMPRESSIONS  1. Left ventricular ejection fraction, by estimation, Willis 50 to 55%. The left ventricle has low normal function. Left ventricular endocardial border not optimally defined to evaluate regional wall motion. Left ventricular diastolic parameters are indeterminate.  2. Right ventricular systolic function Willis normal. The right ventricular size Willis normal. Tricuspid regurgitation signal Willis inadequate for assessing PA pressure.  3. The mitral valve Willis normal in structure. No evidence of mitral valve regurgitation. No evidence of mitral stenosis.  4. The aortic valve Willis normal in structure. Aortic valve regurgitation Willis not visualized. No aortic stenosis Willis present.  5. The inferior vena cava Willis normal in size with greater than 50% respiratory variability, suggesting right atrial pressure of 3 mmHg. FINDINGS  Left Ventricle: Left ventricular ejection fraction, by estimation, Willis 50 to 55%. The left ventricle has low normal function. Left ventricular endocardial border not optimally defined to evaluate regional wall motion. The left ventricular internal  cavity  size was normal in size. There Willis no left ventricular hypertrophy. Left ventricular diastolic parameters are indeterminate. Right Ventricle: The right ventricular size Willis normal. No increase in right ventricular wall thickness. Right ventricular systolic function Willis normal. Tricuspid regurgitation signal Willis inadequate for assessing PA pressure. Left Atrium: Left atrial size was normal in size. Right Atrium: Right atrial size was normal in size. Pericardium: There Willis no evidence of pericardial effusion. Mitral Valve: The mitral valve Willis normal in structure. No evidence of mitral valve regurgitation. No evidence of mitral valve stenosis. MV peak gradient, 4.5  mmHg. The mean mitral valve gradient Willis 2.0 mmHg. Tricuspid Valve: The tricuspid valve Willis normal in structure. Tricuspid valve regurgitation Willis not demonstrated. No evidence of tricuspid stenosis. Aortic Valve: The aortic valve Willis normal in structure. Aortic valve regurgitation Willis not visualized. No aortic stenosis Willis present. Aortic valve mean gradient measures 4.0 mmHg. Aortic valve peak gradient measures 6.8 mmHg. Pulmonic Valve: The pulmonic valve was not well visualized. Pulmonic valve regurgitation Willis not visualized. No evidence of pulmonic stenosis. Aorta: The aortic root Willis normal in size and structure. Venous: The inferior vena cava Willis normal in size with greater than 50% respiratory variability, suggesting right atrial pressure of 3 mmHg. IAS/Shunts: No atrial level shunt detected by color flow Doppler.   LV Volumes (MOD) LV vol d, MOD A4C: 99.4 ml Diastology LV vol s, MOD A4C: 58.3 ml LV e' lateral:   10.30 cm/s LV SV MOD A4C:     99.4 ml LV E/e' lateral: 5.8  LEFT ATRIUM           Index LA Vol (A4C): 41.5 ml 20.86 ml/m  AORTIC VALVE AV Vmax:           130.00 cm/s AV Vmean:          93.400 cm/s AV VTI:            0.243 m AV Peak Grad:      6.8 mmHg AV Mean Grad:      4.0 mmHg LVOT Vmax:         92.70 cm/s LVOT Vmean:        60.500 cm/s LVOT VTI:          0.151 m LVOT/AV VTI ratio: 0.62 MITRAL VALVE MV Area (PHT): 6.96 cm    SHUNTS MV Peak grad:  4.5 mmHg    Systemic VTI: 0.15 m MV Mean grad:  2.0 mmHg MV Vmax:       1.06 m/s MV Vmean:      67.9 cm/s MV Decel Time: 109 msec MV E velocity: 59.60 cm/s MV A velocity: 68.60 cm/s MV E/A ratio:  0.87 Kathlyn Sacramento MD Electronically signed by Kathlyn Sacramento MD Signature Date/Time: 10/10/2020/2:00:06 PM    Final     Assessment and plan-   # #Acute respiratory failure with hypoxia Continue Augmentin  Continue Solu-Medrol and IV Lasix.  #Metastatic triple negative breast cancer, patient has high tumor burden, progressed on first-line palliative  chemotherapy with Taxol.  There are other treatment options available, i.e. Adriamycin +/- Cytoxan, Sacituzumab govitecan.  Immunotherapy Willis less likely beneficial given her CPS <1. Patient Willis very sick with visceral crisis/obstructive pneumonia.  Status post 1 dose of Adriamycin 60mg /m2, day 5  Zofran 4mg  Q8 PRN History of QTc prolongation, OTc corrected on recent EKG. Keep potassium above 4, and Mag >2. Start her on daily oral potassium 69meq.   #Nausea, limited on Zofran dose.  I added  scopolamine patch. #Poor appetite, trials of Marinol. Pete CBC tomorrow. Thank you for allowing me to participate in the care of this patient. Code status DNR  #PT evaluation Earlie Server, MD, PhD Hematology Oncology [facility] Pager- [phone] 10/17/2020

## 2020-10-17 NOTE — Consult Note (Signed)
PHARMACY CONSULT NOTE  Pharmacy Consult for Electrolyte Monitoring and Replacement   Recent Labs: Potassium (mmol/L)  Date Value  10/17/2020 4.5   Magnesium (mg/dL)  Date Value  10/15/2020 2.5 (H)   Calcium (mg/dL)  Date Value  10/17/2020 8.3 (L)   Albumin (g/dL)  Date Value  10/15/2020 2.6 (L)   Phosphorus (mg/dL)  Date Value  10/15/2020 2.8   Sodium (mmol/L)  Date Value  10/17/2020 142   Assessment: Patient is a 55 y/o F with medical history including metastatic breast cancer on chemotherapy, tachycardia, anemia, GERD, malignant pleural effusion, hemothorax who was admitted 2/25 with respiratory failure secondary to pneumonia versus worsening metastatic disease. Pharmacy consulted to assist with electrolyte monitoring and replacement as indicated.   Per chart review, appetite appears poor.   Diuresis: IV Lasix   Goal of Therapy:  Electrolytes within normal limits  Plan:  3/6  Na 142, K 4.5  Scr 0.36  - currently on lasix 40 IV BID, Mag PO 64mg  daily, KCL 20 meq PO daily -no replacement at this time. Watch increase in lasix dose --Will follow-up electrolytes with AM labs  Jessica Willis A 10/17/2020 9:29 AM

## 2020-10-17 NOTE — Progress Notes (Signed)
PROGRESS NOTE  Jessica Willis DEY:[id number] DOB: 1966/03/11 DOA: 10/07/2020 PCP: [place]  Brief History   55 year old woman PMH stage IV metastatic breast cancer on chemotherapy, malignant pleural effusion, hemothorax presented with shortness of breath.  Admitted for acute hypoxic respiratory failure, sepsis thought secondary to pneumonia complicated by metastatic breast cancer. Seen by pulmonology, ID, oncology and PMT. Plan is to continue aggressive treatment, started chemotherapy 3/4.  Major issue is acute hypoxic respiratory failure requiring 4-6 L nasal cannula.  Eventually home with home health.  A & P  Acute hypoxic respiratory failure secondary to pneumonia versus metastatic disease/lymphangitic spread of cancer progression.  Sepsis considered on admission.  Extensive airspace abnormalities of the right lung and loculated pleural effusion, infectious versus malignant. --Pulmonology following.  Continues on antibiotics, albumin and Lasix. --Oxygen requirement 4-6 L, clinically appears unchanged.  Stage IV metastatic fungating breast cancer with large necrotizing mass, high tumor burden, progressed on weekly Taxol last dose 09/15/2020 --Started chemotherapy 3/4.  Fungating right breast mass with drainage --Continue wound care, goal directed towards observing drainage and decreasing discomfort.  Iron deficiency anemia  --Stable.  Chronic tachycardia --Continue beta-blocker  Goals of care --disease not curable, refractory to first-line chemotherapy, aggressive in nature.  Palliative care following.  Guarded prognosis.  Disposition Plan:  Discussion: Respiratory status appears clinically stable although now on 6 L.  Continue present management.  Status is: Inpatient  Remains inpatient appropriate because:IV treatments appropriate due to intensity of illness or inability to take PO and Inpatient level of care appropriate due to severity of  illness   Dispo: The patient is from: Home              Anticipated d/c is to: TBD              Patient currently is not medically stable to d/c.   Difficult to place patient No  DVT prophylaxis: enoxaparin (LOVENOX) injection 40 mg Start: 10/07/20 2200   Code Status: DNR Level of care: Med-Surg Family Communication: none  Murray Hodgkins, MD  [facility] Direct contact: see [email or web address] (further directions at bottom of note if needed) 7PM-7AM contact night coverage as at bottom of note 10/17/2020, 4:06 PM  LOS: 10 days   Significant Hospital Events   .    Consults:  . Oncology . PMT . pulmonology   Procedures:  .   Significant Diagnostic Tests:  Marland Kitchen    Micro Data:  .    Antimicrobials:  .   Interval History/Subjective  CC: f/u SOB  Feels ok, breathing ok, no BM yet but feels like one is coming.  Objective   Vitals:  Vitals:   10/17/20 1121 10/17/20 1542  BP: 114/70 118/66  Pulse: (!) 110 100  Resp: 19 19  Temp: 97.9 F (36.6 C) 98.4 F (36.9 C)  SpO2: 95% 90%    Exam:  Constitutional:   . Appears calm and comfortable ENMT:  . grossly normal hearing  Respiratory:  . CTA bilaterally, no w/r/r. Fair air movement . Respiratory effort mildly increaesd . On 6L [place] Cardiovascular:  . RRR, no m/r/g . No LE extremity edema   Psychiatric:  . Mental status o Mood, affect appropriate  I have personally reviewed the following:   Today's Data  . BMP stable  Scheduled Meds: . amoxicillin-clavulanate  1 tablet Oral Q12H  . calcium carbonate  1,250 mg Oral Q breakfast  . Chlorhexidine Gluconate Cloth  6 each Topical Daily  . senna  2 tablet Oral BID   And  . docusate sodium  100 mg Oral BID  . dronabinol  5 mg Oral BID AC  . enoxaparin (LOVENOX) injection  40 mg Subcutaneous Q24H  . feeding supplement  237 mL Oral TID BM  . ferrous sulfate  325 mg Oral BID WC  . furosemide  40 mg Intravenous BID  . magnesium chloride  1 tablet Oral Daily  .  melatonin  5 mg Oral QHS  . methylPREDNISolone (SOLU-MEDROL) injection  20 mg Intravenous Q12H  . metoprolol tartrate  50 mg Oral BID  . multivitamin with minerals  1 tablet Oral Daily  . polyethylene glycol  17 g Oral BID  . potassium chloride  20 mEq Oral Daily  . scopolamine  1 patch Transdermal Q72H  . sodium chloride flush  10-40 mL Intracatheter Q12H   Continuous Infusions: . albumin human 12.5 g (10/17/20 0849)    Principal Problem:   HCAP (healthcare-associated pneumonia) Active Problems:   Sepsis ([place])   Acute respiratory failure with hypoxia (HCC)   Metastatic breast cancer (HCC)   Tachycardia   Iron deficiency anemia   Chemotherapy-induced nausea   Poor appetite   LOS: 10 days   How to contact the [facility] Attending or Consulting provider [place] or covering provider during after hours [place], for this patient?  1. Check the care team in [facility] and look for a) attending/consulting TRH provider listed and b) the [facility] team listed 2. Log into [email or web address] and use [place]'s universal password to access. If you do not have the password, please contact the hospital operator. 3. Locate the [facility] provider you are looking for under [facility] and page to a number that you can be directly reached. 4. If you still have difficulty reaching the provider, please page the [facility] (Director on Call) for the Hospitalists listed on amion for assistance.

## 2020-10-17 NOTE — Progress Notes (Signed)
Pulmonary Medicine          Date: 10/17/2020,   MRN# [id number] Jessica Willis 10-11-1965     AdmissionWeight: 77.6 kg                 CurrentWeight: 81.2 kg   Referring physician: Dr Tasia Catchings   CHIEF COMPLAINT:      HISTORY OF PRESENT ILLNESS   Jessica Willis is a 55 y.o. female with medical history significant of stage IV metastasized breast cancer on chemotherapy, tachycardia, GERD, anemia, malignant pleural effusion, hemothorax, who presents with shortness of breath. In ER initally presentedwith cough and sob. She had massive rt pleural effusion with collapse of rt lung . She also had rt breast mass with changes in skin color for  6 months . She underwent thoracenteiss and it was metastatic adenocarcinoma. MRI of the brain showed scattered metastatic implants upper cervical spine.  She had a port placement and also underwent incisional biopsy of the rt breast mass on 06/28/20.  She got a dose of taxol while inpatient and was discharged on 07/01/20. She followed up with oncologist as OP, on palliative chemotherapy with weekly taxol.  Has been having tachycardia and SOB- saw Cardiology on 09/26/20 Prolonged QT and tachycardia. Pt was found to have WBC 22.3, negative Covid PCR, lactic acid 2.3, INR 1.3, PTT 36, electrolytes renal function okay, temperature 99.5, blood pressure 138/80, heart rate 134, RR 20, oxygen saturation 87% on room air, which improved her to 100% on 2 L oxygen. PCCM consultation placed due to complicated respiratory status with metastatic lung cancer and large malignant effusion. CT chest below shows pleural effusion bilaterally worse on right with metastatic lesions visible on bony spine from thoracic spine down and significant hilar and mediastinal lymphadneopathy suggestive of widespread mets.   10/13/20- patient reports improved breathing, weaned O2 to 4L [place].  She is diuresing well which ishelping her breathing.  She responded well to steroids,  inflammatory markers are elevated likely due to active malignancy and CAP(although most of this is likely fluid and unclear how much is actual infection).  She feels strong enough to get up and participate with PT.  She is using IS and able to take tidal volume 500cc today.  I have encouraged to use every hour. I met with son and reviewed care plan.   10/14/20-  Patient had onc tx today. She felt onset of SOB few hours after coming back however vitals stable.   10/15/20- patient had incresed O2 req this am to 5L/min.  I will repeat CXR today.  She was able to eat this am, states overnight she felt worsening SOB post therapy yesterday. Shes making adequate urine overnight, will keep lasix at 40.  Will dc albumin as this can sometimes cause pulm edema and will keep low dose bid solumedrol 20 for now.   10/16/20-  Patient weaned to 4L/min [place]. Reviewed CXR with improved left side still significant airspace and interstitial opacification on right. She had documented acute episode of resp distress with spO2 74%   10/17/20- patient resting in bed in no distress. She is on 4-5L/mn [place].  Lasix 40 BID with albumin infusing but patient remains with plateau in terms of improvement over past 24-48h.  Continue MEtaneb. PT/OT     PAST MEDICAL HISTORY   Past Medical History:  Diagnosis Date  . Anemia   . Family history of breast cancer   . Patient denies medical problems  SURGICAL HISTORY   Past Surgical History:  Procedure Laterality Date  . BREAST BIOPSY  06/28/2020   Procedure: BREAST BIOPSY;  Surgeon: Benjamine Sprague, DO;  Location: [facility] ORS;  Service: General;;  . PORTACATH PLACEMENT N/A 06/28/2020   Procedure: INSERTION PORT-A-CATH;  Surgeon: Benjamine Sprague, DO;  Location: [facility] ORS;  Service: General;  Laterality: N/A;  . TUBAL LIGATION    . VIDEO ASSISTED THORACOSCOPY (VATS)/THOROCOTOMY Right 06/23/2020   Procedure: ATTEMPTED VIDEO ASSISTED THORACOSCOPY (VATS);  Surgeon: Nestor Lewandowsky, MD;  Location:  [facility] ORS;  Service: General;  Laterality: Right;     FAMILY HISTORY   Family History  Problem Relation Age of Onset  . Diabetes Other   . Hypertension Other   . Diabetes Maternal Aunt   . Breast cancer Cousin        dx 62s  . Breast cancer Cousin        dx 24s     SOCIAL HISTORY   Social History   Tobacco Use  . Smoking status: Never Smoker  . Smokeless tobacco: Never Used  Vaping Use  . Vaping Use: Never used  Substance Use Topics  . Alcohol use: Not Currently    Alcohol/week: 1.0 standard drink    Types: 1 Cans of beer per week  . Drug use: No     MEDICATIONS    Home Medication:    Current Medication:  Current Facility-Administered Medications:  .  acetaminophen (TYLENOL) tablet 650 mg, 650 mg, Oral, Q6H PRN, Ivor Costa, MD, 650 mg at 10/09/20 2144 .  albumin human 25 % solution 12.5 g, 12.5 g, Intravenous, Daily, Lanney Gins, Ceirra Belli, MD, Last Rate: 60 mL/hr at 10/17/20 0849, 12.5 g at 10/17/20 0849 .  albuterol (PROVENTIL) (2.5 MG/3ML) 0.083% nebulizer solution 2.5 mg, 2.5 mg, Nebulization, Q6H PRN, Samuella Cota, MD, 2.5 mg at 10/16/20 1612 .  albuterol (VENTOLIN HFA) 108 (90 Base) MCG/ACT inhaler 2 puff, 2 puff, Inhalation, Q4H PRN, Ivor Costa, MD .  amoxicillin-clavulanate (AUGMENTIN) 875-125 MG per tablet 1 tablet, 1 tablet, Oral, Q12H, Ottie Glazier, MD, 1 tablet at 10/17/20 4401 .  bisacodyl (DULCOLAX) suppository 10 mg, 10 mg, Rectal, Daily PRN, Samuella Cota, MD .  calcium carbonate (OS-CAL - dosed in mg of elemental calcium) tablet 1,250 mg, 1,250 mg, Oral, Q breakfast, Ivor Costa, MD, 1,250 mg at 10/17/20 0272 .  Chlorhexidine Gluconate Cloth 2 % PADS 6 each, 6 each, Topical, Daily, Sharen Hones, MD, 6 each at 10/17/20 [phone]  dextromethorphan-guaiFENesin (MUCINEX DM) 30-600 MG per 12 hr tablet 1 tablet, 1 tablet, Oral, BID PRN, Ivor Costa, MD .  senna (SENOKOT) tablet 17.2 mg, 2 tablet, Oral, BID, 17.2 mg at 10/17/20 1046 **AND** docusate  sodium (COLACE) capsule 100 mg, 100 mg, Oral, BID, Samuella Cota, MD, 100 mg at 10/17/20 4403 .  dronabinol (MARINOL) capsule 5 mg, 5 mg, Oral, BID AC, Earlie Server, MD .  enoxaparin (LOVENOX) injection 40 mg, 40 mg, Subcutaneous, Q24H, Ivor Costa, MD, 40 mg at 10/16/20 2114 .  feeding supplement (ENSURE ENLIVE / ENSURE PLUS) liquid 237 mL, 237 mL, Oral, TID BM, Sharen Hones, MD, 237 mL at 10/17/20 1309 .  ferrous sulfate tablet 325 mg, 325 mg, Oral, BID WC, Ivor Costa, MD, 325 mg at 10/17/20 4742 .  furosemide (LASIX) injection 40 mg, 40 mg, Intravenous, BID, Ottie Glazier, MD, 40 mg at 10/17/20 0829 .  lidocaine-prilocaine (EMLA) cream 1 application, 1 application, Topical, PRN, Ivor Costa, MD .  magnesium chloride (  SLOW-MAG) 64 MG SR tablet 64 mg, 1 tablet, Oral, Daily, Ivor Costa, MD, 64 mg at 10/17/20 0277 .  melatonin tablet 5 mg, 5 mg, Oral, QHS, Sharen Hones, MD, 5 mg at 10/16/20 2114 .  methylPREDNISolone sodium succinate (SOLU-MEDROL) 40 mg/mL injection 20 mg, 20 mg, Intravenous, Q12H, Lanney Gins, Rashan Patient, MD, 20 mg at 10/17/20 1436 .  metoprolol tartrate (LOPRESSOR) tablet 50 mg, 50 mg, Oral, BID, Ivor Costa, MD, 50 mg at 10/16/20 2114 .  multivitamin with minerals tablet 1 tablet, 1 tablet, Oral, Daily, Samuella Cota, MD, 1 tablet at 10/17/20 4128 .  ondansetron (ZOFRAN) injection 4 mg, 4 mg, Intravenous, Q8H PRN, Ivor Costa, MD, 4 mg at 10/17/20 1309 .  polyethylene glycol (MIRALAX / GLYCOLAX) packet 17 g, 17 g, Oral, BID, Samuella Cota, MD, 17 g at 10/17/20 7867 .  potassium chloride SA (KLOR-CON) CR tablet 20 mEq, 20 mEq, Oral, Daily, Earlie Server, MD, 20 mEq at 10/17/20 6720 .  scopolamine (TRANSDERM-SCOP) 1 MG/3DAYS 1.5 mg, 1 patch, Transdermal, Q72H, Earlie Server, MD, 1.5 mg at 10/17/20 1437 .  simethicone (MYLICON) chewable tablet 80 mg, 80 mg, Oral, Daily PRN, Earlie Server, MD, 80 mg at 10/15/20 1002 .  sodium chloride (OCEAN) 0.65 % nasal spray 1 spray, 1 spray, Each Nare, PRN,  Samuella Cota, MD .  sodium chloride flush (NS) 0.9 % injection 10-40 mL, 10-40 mL, Intracatheter, Q12H, Sharen Hones, MD, 10 mL at 10/17/20 0824 .  sodium chloride flush (NS) 0.9 % injection 10-40 mL, 10-40 mL, Intracatheter, PRN, Sharen Hones, MD, 10 mL at 10/10/20 1447    ALLERGIES   Patient has no known allergies.     REVIEW OF SYSTEMS    Review of Systems:  Gen:  Denies  fever, sweats, chills weigh loss  HEENT: Denies blurred vision, double vision, ear pain, eye pain, hearing loss, nose bleeds, sore throat Cardiac:  No dizziness, chest pain or heaviness, chest tightness,edema Resp:   Denies cough or sputum porduction, shortness of breath,wheezing, hemoptysis,  Gi: Denies swallowing difficulty, stomach pain, nausea or vomiting, diarrhea, constipation, bowel incontinence Gu:  Denies bladder incontinence, burning urine Ext:   Denies Joint pain, stiffness or swelling Skin: Denies  skin rash, easy bruising or bleeding or hives Endoc:  Denies polyuria, polydipsia , polyphagia or weight change Psych:   Denies depression, insomnia or hallucinations   Other:  All other systems negative   VS: BP 118/66 (BP Location: Left Arm)   Pulse 100   Temp 98.4 F (36.9 C) (Axillary)   Resp 19   Ht 5' 10"  (1.778 m)   Wt 81.2 kg   LMP 06/16/2015 Comment: Tubal Ligation   SpO2 90%   BMI 25.68 kg/m      PHYSICAL EXAM    GENERAL:NAD, no fevers, chills, no weakness no fatigue HEAD: Normocephalic, atraumatic.  EYES: Pupils equal, round, reactive to light. Extraocular muscles intact. No scleral icterus.  MOUTH: Moist mucosal membrane. Dentition intact. No abscess noted.  EAR, NOSE, THROAT: Clear without exudates. No external lesions.  NECK: Supple. No thyromegaly. No nodules. No JVD.  PULMONARY: Diffuse coarse rhonchi right sided +wheezes CARDIOVASCULAR: S1 and S2. Regular rate and rhythm. No murmurs, rubs, or gallops. No edema. Pedal pulses 2+ bilaterally.  GASTROINTESTINAL:  Soft, nontender, nondistended. No masses. Positive bowel sounds. No hepatosplenomegaly.  MUSCULOSKELETAL: No swelling, clubbing, or edema. Range of motion full in all extremities.  NEUROLOGIC: Cranial nerves II through XII are intact. No gross focal neurological deficits. Sensation  intact. Reflexes intact.  SKIN: No ulceration, lesions, rashes, or cyanosis. Skin warm and dry. Turgor intact.  PSYCHIATRIC: Mood, affect within normal limits. The patient is awake, alert and oriented x 3. Insight, judgment intact.       IMAGING    DG Chest 2 View  Result Date: 10/07/2020 CLINICAL DATA:  Hypoxia, shortness of breath, metastatic breast cancer EXAM: CHEST - 2 VIEW COMPARISON:  09/13/2020 FINDINGS: LEFT subclavian Port-A-Cath with tip projecting over RIGHT atrium. Enlargement of cardiac silhouette. Pulmonary vascular congestion. Persistent enlargement of RIGHT hilum question mass or adenopathy. Increased infiltrate of the mid to lower RIGHT lung. Small RIGHT pleural effusion again seen. Accentuation of markings in LEFT perihilar region little changed. No pneumothorax or acute osseous findings. IMPRESSION: Increased RIGHT perihilar infiltrate with persistent small RIGHT pleural effusion. Enlargement of RIGHT pulmonary hilum by adenopathy versus perihilar mass. Electronically Signed   By: Lavonia Dana M.D.   On: 10/07/2020 11:32   CT Angio Chest PE W and/or Wo Contrast  Result Date: 10/07/2020 CLINICAL DATA:  Breast carcinoma. Tachycardia and shortness of breath EXAM: CT ANGIOGRAPHY CHEST WITH CONTRAST TECHNIQUE: Multidetector CT imaging of the chest was performed using the standard protocol during bolus administration of intravenous contrast. Multiplanar CT image reconstructions and MIPs were obtained to evaluate the vascular anatomy. CONTRAST:  60m OMNIPAQUE IOHEXOL 350 MG/ML SOLN COMPARISON:  CT angiogram chest June 15, 2020; chest radiograph October 07, 2020 FINDINGS: Cardiovascular: There is no  evident pulmonary embolus. No appreciable thoracic aortic aneurysm or dissection. Visualized great vessels appear unremarkable. There is no appreciable pericardial effusion or pericardial thickening. Port-A-Cath tip is in the superior vena cava near the cavoatrial junction. Mediastinum/Nodes: Thyroid appears normal. There are multiple axillary lymph nodes bilaterally, more severe on the right than on the left. The largest lymph node is seen in the right axillary region measuring 2.7 x 2.1 cm. There is adenopathy in the right supraclavicular and infraclavicular regions with extension of adenopathy into the right Peri carinal region. Largest individual lymph node in these areas measures 2.6 x 1.8 cm. Several left supraclavicular lymph nodes are also noted, largest measuring 1.7 x 1.3 cm. There are several subcentimeter mediastinal lymph nodes. There is extensive retrocrural adenopathy. The largest lymph node or collection of matted lymph nodes is seen inferior to the aorta on the left posteriorly measuring 3.4 x 2.1 cm. No esophageal lesions are evident. Lungs/Pleura: There is extensive airspace consolidation in portions of the right middle and lower lobes with interspersed areas of loculated pleural effusion. There is ill-defined airspace opacity consistent with pneumonia in the left upper lobe. Loculated fluid tracks along the left major fissure. There may well be associated metastasis within the fissure given the extensive nodularity in this area. There is a small left pleural effusion with left base atelectasis. Pleural metastases noted along each upper hemithorax, primarily posteriorly as well as in the right apex. Upper Abdomen: Retrocrural adenopathy extends into the upper abdominal region. Visualized upper abdominal structures otherwise appear unremarkable. Musculoskeletal: There is a large mass occupying the right breast and chest wall region with invasion of the deep muscles of the chest wall on the right.  There is also invasion of the right axilla. There is extensive subcutaneous thickening in both breast regions. There is a mass in the lateral left breast with extension of soft tissue prominence along the lateral left hemithorax, likely due to extension of neoplasm into the adjacent soft tissues. There is widespread sclerotic bony metastatic disease throughout the  thoracic region. Review of the MIP images confirms the above findings. IMPRESSION: 1. No demonstrable pulmonary embolus. No thoracic aortic aneurysm or dissection. 2. Areas of airspace consolidation noted in the right middle and lower lobes. More ill-defined infiltrate is noted in the left upper lobe anteriorly. Areas of loculated fluid noted in the left major fissure. Nodularity in this area could indicate superimposed masses in left major fissure. There is pleural thickening along both posterior upper hemithorax regions, likely pleural metastases. Loculated fluid noted in areas of infiltrate on the right. Small left pleural effusion noted. 3. Adenopathy at multiple sites, most severe in the retrocrural regions bilaterally, right supraclavicular region, and right axillary region, although there is left axillary and left supraclavicular adenopathy. 4. Large mass lesions occupying the right breast with extension and invasion of the chest wall and right axillary regions. A lesser degree of metastatic disease to the left of midline in the subcutaneous tissues and lateral left breast/lateral left chest wall soft tissues noted. 5. Multifocal sclerotic bony metastatic disease throughout the thoracic region noted. Electronically Signed   By: Lowella Grip III M.D.   On: 10/07/2020 13:23   US Venous Img Upper Uni Right(DVT)  Result Date: 10/08/2020 CLINICAL DATA:  History of stage IV breast cancer now with right upper extremity pain and edema. Evaluate for DVT. EXAM: RIGHT UPPER EXTREMITY VENOUS DOPPLER ULTRASOUND TECHNIQUE: Gray-scale sonography with  graded compression, as well as color Doppler and duplex ultrasound were performed to evaluate the upper extremity deep venous system from the level of the subclavian vein and including the jugular, axillary, basilic, radial, ulnar and upper cephalic vein. Spectral Doppler was utilized to evaluate flow at rest and with distal augmentation maneuvers. COMPARISON:  None. FINDINGS: Contralateral Subclavian Vein: Respiratory phasicity is normal and symmetric with the symptomatic side. No evidence of thrombus. Normal compressibility. Internal Jugular Vein: No evidence of thrombus. Normal compressibility, respiratory phasicity and response to augmentation. Subclavian Vein: No evidence of thrombus. Normal compressibility, respiratory phasicity and response to augmentation. Axillary Vein: Appears patent where imaged though evaluation degraded secondary to patient's discomfort with sonographic evaluation of the axilla. Cephalic Vein: No evidence of thrombus. Normal compressibility, respiratory phasicity and response to augmentation. Basilic Vein: No evidence of thrombus. Normal compressibility, respiratory phasicity and response to augmentation. Brachial Veins: No evidence of thrombus within either of the paired brachial veins. Normal compressibility, respiratory phasicity and response to augmentation. Radial Veins: No evidence of thrombus. Normal compressibility, respiratory phasicity and response to augmentation. Ulnar Veins: No evidence of thrombus. Normal compressibility, respiratory phasicity and response to augmentation. Venous Reflux:  None visualized. Other Findings: There is a moderate amount of subcutaneous edema at the level of the right upper arm. IMPRESSION: No evidence of DVT within the right upper extremity. Electronically Signed   By: Sandi Mariscal M.D.   On: 10/08/2020 14:29   DG Chest Port 1 View  Result Date: 10/15/2020 CLINICAL DATA:  Pneumonia and pleural effusion. Metastatic breast cancer. EXAM: PORTABLE  CHEST 1 VIEW COMPARISON:  10/13/2020 chest radiograph and prior studies FINDINGS: Cardiomediastinal silhouette is unchanged with RIGHT mediastinal fullness. A LEFT subclavian Port-A-Cath with tip overlying the UPPER RIGHT atrium again noted. Scattered opacities within both lungs are again noted with a RIGHT pleural effusion. There is no evidence of pneumothorax. No significant changes are identified. IMPRESSION: Unchanged appearance of the chest with scattered opacities within both lungs and RIGHT pleural effusion. Electronically Signed   By: Margarette Canada M.D.   On: 10/15/2020 11:06  DG Chest Port 1 View  Result Date: 10/13/2020 CLINICAL DATA:  Pleural effusion EXAM: PORTABLE CHEST 1 VIEW COMPARISON:  10/07/2020 FINDINGS: Unchanged positioning of left-sided Port-A-Cath. Stable cardiomediastinal contours. Persistent small right pleural effusion. Slight progression of airspace opacity throughout the right lung most pronounced within the right lung base. Similar interstitial opacities throughout the left lung. No pneumothorax. IMPRESSION: 1. Slight progression of airspace opacity throughout the right lung, most pronounced within the right lung base. 2. Persistent small right pleural effusion. Electronically Signed   By: Davina Poke D.O.   On: 10/13/2020 14:10   ECHOCARDIOGRAM COMPLETE  Result Date: 10/10/2020    ECHOCARDIOGRAM REPORT   Patient Name:   Jessica Willis Date of Exam: 10/10/2020 Medical Rec #:  [id number]           Height:       70.0 in Accession #:    [id number]          Weight:       178.7 lb Date of Birth:  1966/06/03           BSA:          1.990 m Patient Age:    86 years            BP:           119/79 mmHg Patient Gender: F                   HR:           105 bpm. Exam Location:  [facility] Procedure: 2D Echo, Limited Color Doppler and Cardiac Doppler Indications:     R06.03 Acute Respiratory Distress  History:         Patient has no prior history of Echocardiogram examinations.                   Signs/Symptoms:Shortness of Breath. Breast cancer.  Sonographer:     Charmayne Sheer RDCS (AE) Referring Phys:  [id number] Sharen Hones Diagnosing Phys: Kathlyn Sacramento MD  Sonographer Comments: Technically challenging study due to limited acoustic windows. IMPRESSIONS  1. Left ventricular ejection fraction, by estimation, is 50 to 55%. The left ventricle has low normal function. Left ventricular endocardial border not optimally defined to evaluate regional wall motion. Left ventricular diastolic parameters are indeterminate.  2. Right ventricular systolic function is normal. The right ventricular size is normal. Tricuspid regurgitation signal is inadequate for assessing PA pressure.  3. The mitral valve is normal in structure. No evidence of mitral valve regurgitation. No evidence of mitral stenosis.  4. The aortic valve is normal in structure. Aortic valve regurgitation is not visualized. No aortic stenosis is present.  5. The inferior vena cava is normal in size with greater than 50% respiratory variability, suggesting right atrial pressure of 3 mmHg. FINDINGS  Left Ventricle: Left ventricular ejection fraction, by estimation, is 50 to 55%. The left ventricle has low normal function. Left ventricular endocardial border not optimally defined to evaluate regional wall motion. The left ventricular internal cavity  size was normal in size. There is no left ventricular hypertrophy. Left ventricular diastolic parameters are indeterminate. Right Ventricle: The right ventricular size is normal. No increase in right ventricular wall thickness. Right ventricular systolic function is normal. Tricuspid regurgitation signal is inadequate for assessing PA pressure. Left Atrium: Left atrial size was normal in size. Right Atrium: Right atrial size was normal in size. Pericardium: There is no evidence of pericardial effusion. Mitral Valve: The mitral valve  is normal in structure. No evidence of mitral valve regurgitation. No evidence  of mitral valve stenosis. MV peak gradient, 4.5 mmHg. The mean mitral valve gradient is 2.0 mmHg. Tricuspid Valve: The tricuspid valve is normal in structure. Tricuspid valve regurgitation is not demonstrated. No evidence of tricuspid stenosis. Aortic Valve: The aortic valve is normal in structure. Aortic valve regurgitation is not visualized. No aortic stenosis is present. Aortic valve mean gradient measures 4.0 mmHg. Aortic valve peak gradient measures 6.8 mmHg. Pulmonic Valve: The pulmonic valve was not well visualized. Pulmonic valve regurgitation is not visualized. No evidence of pulmonic stenosis. Aorta: The aortic root is normal in size and structure. Venous: The inferior vena cava is normal in size with greater than 50% respiratory variability, suggesting right atrial pressure of 3 mmHg. IAS/Shunts: No atrial level shunt detected by color flow Doppler.   LV Volumes (MOD) LV vol d, MOD A4C: 99.4 ml Diastology LV vol s, MOD A4C: 58.3 ml LV e' lateral:   10.30 cm/s LV SV MOD A4C:     99.4 ml LV E/e' lateral: 5.8  LEFT ATRIUM           Index LA Vol (A4C): 41.5 ml 20.86 ml/m  AORTIC VALVE AV Vmax:           130.00 cm/s AV Vmean:          93.400 cm/s AV VTI:            0.243 m AV Peak Grad:      6.8 mmHg AV Mean Grad:      4.0 mmHg LVOT Vmax:         92.70 cm/s LVOT Vmean:        60.500 cm/s LVOT VTI:          0.151 m LVOT/AV VTI ratio: 0.62 MITRAL VALVE MV Area (PHT): 6.96 cm    SHUNTS MV Peak grad:  4.5 mmHg    Systemic VTI: 0.15 m MV Mean grad:  2.0 mmHg MV Vmax:       1.06 m/s MV Vmean:      67.9 cm/s MV Decel Time: 109 msec MV E velocity: 59.60 cm/s MV A velocity: 68.60 cm/s MV E/A ratio:  0.87 Kathlyn Sacramento MD Electronically signed by Kathlyn Sacramento MD Signature Date/Time: 10/10/2020/2:00:06 PM    Final          ASSESSMENT/PLAN   Malignant pleural effusion    - since patient had initial therapeutic response to thoracentesis she will likely benefit from permament tunneled pleural catheter via IR  service as palliative option for malignant effusion due to high probability of rapid re-accumulation    -there is no renal failure, hepatic synthetic function within reference range without HASH/cirhosis, no CHF per most recent TTE   - no lymphadema  Metastatic lung cancer   -Currently being followed by oncology Dr Tasia Catchings - appreciate input     Compressive atelectasis   Worse at Right lowe lobe - patient is weak and unlikely to be able to perform well with incentive spirometry.  She is a good candidate for albuterol infused Metaneb therapy can do BID to help with recruitment.     Chronic deconditioning and severe protein calorie malnutrition     -nutritional and PT evaluation     -palliative care on case - appreciate input     -bitemporal wasting noted on examination     - high risk refeeding syndrome - monitor electorlytes - will place consult for pharmD and RD  Acute hypoxemic respiratory failure    - patient generally not on supplemental O2 currently on 7L/min supplemental O2>>4>>5- 10/15/20    - she has leukocytosis but gram stain is negative on pleural fluid and there is no obvious infiltrate although basis bilaterally may have pneumonia, this is obscured by effusion and atelectatic sement.  Legionella and strep pneumo ag is negative. I feel that CAP is less likely.  Have ordered iflammatory markers and PCT.      - lasix to help with residual effusion     -Soluedrol 20 bid IV     - lasix 40 iv daily >>bid 10/16/20     - albumin 65m 25% 1 amd daily with diuresis     - chest physitherapy to recruit atelectatic lungs     -etiology is all of the above conditions     - TTE with no findings to suggest CHF realted hypoxemia  -repeat cxr 10/15/20- reviewed     Thank you for allowing me to participate in the care of this patient.   Patient/Family are satisfied with care plan and all questions have been answered.  This document was prepared using Dragon voice recognition software and may  include unintentional dictation errors.     FOttie Glazier M.D.  Division of [facility]

## 2020-10-17 NOTE — Evaluation (Signed)
Occupational Therapy Evaluation Patient Details Name: Jessica Willis MRN: [id number] DOB: 1966-03-25 Today's Date: 10/17/2020    History of Present Illness Jessica Willis is a 55 y.o. female with medical history significant of stage IV metastasized breast cancer on chemotherapy, tachycardia, GERD, anemia, malignant pleural effusion, hemothorax, who presented to [place] ED on 10/07/2020 with shortness of breath. PE ruled out.  Chest CT scan showed pneumonia of the right middle lobe and lower lobe. Admitted for acute hypoxic respiratory failure, sepsis thought secondary to pneumonia complicated by metastatic breast cancer. Seen by pulmonology, ID, oncology and PMT. Plan is to continue aggressive treatment, started chemotherapy 3/4. Has fungating right breast mass with drainage. Disease not curable, refractory to first-line chemotherapy, aggressive in nature.  Palliative care following.  Guarded prognosis.   Clinical Impression   Jessica Willis was seen for OT evaluation this date. Prior to hospital admission, pt was MOD I for mobility when she had the energy. Pt lives with family available PRN (2 sons, 1 dtr, work 2nd shift). Pt presents to acute OT demonstrating impaired ADL performance and functional mobility 2/2 decreased activity tolerance and functional strength/balance deficits.   Pt currently requires SUPERVISION sup<>sit, tolerated sitting EOB ~8 mins. SBA don/doff B socks seated EOB - assist for breathing techniques/rest breaks. SETUP face washing seated EOB, SpO2 91-95% t/o on 6L humidified [place], including with O2 doffed for grooming. Pt reports fatigue and defers mobility transfers. Pt would benefit from skilled OT to address noted impairments and functional limitations (see below for any additional details) in order to maximize safety and independence while minimizing falls risk and caregiver burden. Upon hospital discharge, recommend HHOT to maximize pt safety and return to functional  independence during meaningful occupations of daily life.     Follow Up Recommendations  Home health OT;Supervision/Assistance - 24 hour (pending participation in [place] mobility)    Equipment Recommendations    None   Recommendations for Other Services       Precautions / Restrictions Precautions Precautions: Fall Restrictions Weight Bearing Restrictions: No      Mobility Bed Mobility Overal bed mobility: Needs Assistance Bed Mobility: Supine to Sit;Sit to Supine     Supine to sit: Supervision Sit to supine: Supervision        Transfers                 General transfer comment: pt deferred citing fatigue    Balance Overall balance assessment: Needs assistance Sitting-balance support: No upper extremity supported;Feet supported Sitting balance-Leahy Scale: Good                                     ADL either performed or assessed with clinical judgement   ADL Overall ADL's : Needs assistance/impaired                                       General ADL Comments: SBA don/doff B socks seated EOB - assist for breathing techniques/rest breaks. SpO2 91-95% t/o on 6L humidified [place]. SETUP face washing seated EOB. Anticipate MIN A for BSC t/f                  Pertinent Vitals/Pain Pain Assessment: No/denies pain     Hand Dominance Right   Extremity/Trunk Assessment Upper Extremity Assessment Upper Extremity Assessment: Generalized weakness   Lower  Extremity Assessment Lower Extremity Assessment: Generalized weakness       Communication Communication Communication: No difficulties   Cognition Arousal/Alertness: Awake/alert Behavior During Therapy: WFL for tasks assessed/performed Overall Cognitive Status: Within Functional Limits for tasks assessed                                     General Comments       Exercises Exercises: Other exercises Other Exercises Other Exercises: Pt educated re: OT  role, DME recs, d/c recs, falls prevention, ECS Other Exercises: LBD, face washing, sup<>sit, sitting balance/tolerance   Shoulder Instructions      Home Living Family/patient expects to be discharged to:: Private residence Living Arrangements: Non-relatives/Friends Available Help at Discharge: Family (2 sons + dtr available, work second shift) Type of Home: House Home Access: Stairs to enter Technical brewer of Steps: 1   Home Layout: One level               Home Equipment: Bedside commode;Walker - 2 wheels          Prior Functioning/Environment Level of Independence: Independent with assistive device(s)        Comments: decreased activty tolerance, household ambulator with RW as needed        OT Problem List: Decreased strength;Decreased activity tolerance;Impaired balance (sitting and/or standing);Decreased safety awareness      OT Treatment/Interventions: Self-care/ADL training;Therapeutic exercise;Energy conservation;DME and/or AE instruction;Therapeutic activities;Patient/family education;Balance training    OT Goals(Current goals can be found in the care plan section) Acute Rehab OT Goals Patient Stated Goal: To return home OT Goal Formulation: With patient Time For Goal Achievement: 10/31/20 Potential to Achieve Goals: Good ADL Goals Pt Will Perform Grooming: standing;with modified independence (c LRAD PRN) Pt Will Transfer to Toilet: with modified independence;ambulating;bedside commode (c LRAD PRN) Additional ADL Goal #1: Pt will Independenly verbalize plan to implement x3 ECS  OT Frequency: Min 1X/week   Barriers to D/C: Decreased caregiver support             AM-PAC OT "6 Clicks" Daily Activity     Outcome Measure Help from another person eating meals?: None Help from another person taking care of personal grooming?: A Little Help from another person toileting, which includes using toliet, bedpan, or urinal?: A Little Help from another  person bathing (including washing, rinsing, drying)?: A Little Help from another person to put on and taking off regular upper body clothing?: A Little Help from another person to put on and taking off regular lower body clothing?: A Little 6 Click Score: 19   End of Session Equipment Utilized During Treatment: Oxygen  Activity Tolerance: Patient tolerated treatment well Patient left: in bed;with call bell/phone within reach  OT Visit Diagnosis: Unsteadiness on feet (R26.81);Repeated falls (R29.6)                Time: 4098-1191 OT Time Calculation (min): 17 min Charges:  OT General Charges $OT Visit: 1 Visit OT Evaluation $OT Eval Low Complexity: 1 Low OT Treatments $Self Care/Home Management : 8-22 mins  Dessie Coma, M.S. OTR/L  10/17/20, 12:54 PM  ascom [phone]

## 2020-10-18 DIAGNOSIS — J9601 Acute respiratory failure with hypoxia: Secondary | ICD-10-CM | POA: Diagnosis not present

## 2020-10-18 DIAGNOSIS — Z515 Encounter for palliative care: Secondary | ICD-10-CM | POA: Diagnosis not present

## 2020-10-18 DIAGNOSIS — C50919 Malignant neoplasm of unspecified site of unspecified female breast: Secondary | ICD-10-CM | POA: Diagnosis not present

## 2020-10-18 DIAGNOSIS — J189 Pneumonia, unspecified organism: Secondary | ICD-10-CM | POA: Diagnosis not present

## 2020-10-18 LAB — CBC WITH DIFFERENTIAL/PLATELET
Abs Immature Granulocytes: 0.36 10*3/uL — ABNORMAL HIGH (ref 0.00–0.07)
Basophils Absolute: 0 10*3/uL (ref 0.0–0.1)
Basophils Relative: 0 %
Eosinophils Absolute: 0 10*3/uL (ref 0.0–0.5)
Eosinophils Relative: 0 %
HCT: 30.9 % — ABNORMAL LOW (ref 36.0–46.0)
Hemoglobin: 9.1 g/dL — ABNORMAL LOW (ref 12.0–15.0)
Immature Granulocytes: 2 %
Lymphocytes Relative: 2 %
Lymphs Abs: 0.5 10*3/uL — ABNORMAL LOW (ref 0.7–4.0)
MCH: 25.1 pg — ABNORMAL LOW (ref 26.0–34.0)
MCHC: 29.4 g/dL — ABNORMAL LOW (ref 30.0–36.0)
MCV: 85.1 fL (ref 80.0–100.0)
Monocytes Absolute: 0.1 10*3/uL (ref 0.1–1.0)
Monocytes Relative: 1 %
Neutro Abs: 19.4 10*3/uL — ABNORMAL HIGH (ref 1.7–7.7)
Neutrophils Relative %: 95 %
Platelets: 351 10*3/uL (ref 150–400)
RBC: 3.63 MIL/uL — ABNORMAL LOW (ref 3.87–5.11)
RDW: 16.4 % — ABNORMAL HIGH (ref 11.5–15.5)
WBC: 20.4 10*3/uL — ABNORMAL HIGH (ref 4.0–10.5)
nRBC: 0.1 % (ref 0.0–0.2)

## 2020-10-18 LAB — BASIC METABOLIC PANEL
Anion gap: 10 (ref 5–15)
BUN: 23 mg/dL — ABNORMAL HIGH (ref 6–20)
CO2: 35 mmol/L — ABNORMAL HIGH (ref 22–32)
Calcium: 8.3 mg/dL — ABNORMAL LOW (ref 8.9–10.3)
Chloride: 97 mmol/L — ABNORMAL LOW (ref 98–111)
Creatinine, Ser: 0.45 mg/dL (ref 0.44–1.00)
GFR, Estimated: >60 mL/min (ref >60)
Glucose, Bld: 94 mg/dL (ref 70–99)
Potassium: 4.5 mmol/L (ref 3.5–5.1)
Sodium: 142 mmol/L (ref 135–145)

## 2020-10-18 LAB — MAGNESIUM: Magnesium: 2.6 mg/dL — ABNORMAL HIGH (ref 1.7–2.4)

## 2020-10-18 LAB — PHOSPHORUS: Phosphorus: 2.8 mg/dL (ref 2.5–4.6)

## 2020-10-18 NOTE — Progress Notes (Signed)
Pulmonary Medicine          Date: 10/18/2020,   MRN# [id number] Jessica Willis [birth year]     AdmissionWeight: 77.6 kg                 CurrentWeight: 81.2 kg   Referring physician: Dr Tasia Catchings   CHIEF COMPLAINT:      HISTORY OF PRESENT ILLNESS   Jessica Willis is a 55 y.o. female with medical history significant of stage IV metastasized breast cancer on chemotherapy, tachycardia, GERD, anemia, malignant pleural effusion, hemothorax, who presents with shortness of breath. In ER initally presentedwith cough and sob. She had massive rt pleural effusion with collapse of rt lung . She also had rt breast mass with changes in skin color for  6 months . She underwent thoracenteiss and it was metastatic adenocarcinoma. MRI of the brain showed scattered metastatic implants upper cervical spine.  She had a port placement and also underwent incisional biopsy of the rt breast mass on 06/28/20.  She got a dose of taxol while inpatient and was discharged on 07/01/20. She followed up with oncologist as OP, on palliative chemotherapy with weekly taxol.  Has been having tachycardia and SOB- saw Cardiology on 09/26/20 Prolonged QT and tachycardia. Pt was found to have WBC 22.3, negative Covid PCR, lactic acid 2.3, INR 1.3, PTT 36, electrolytes renal function okay, temperature 99.5, blood pressure 138/80, heart rate 134, RR 20, oxygen saturation 87% on room air, which improved her to 100% on 2 L oxygen. PCCM consultation placed due to complicated respiratory status with metastatic lung cancer and large malignant effusion. CT chest below shows pleural effusion bilaterally worse on right with metastatic lesions visible on bony spine from thoracic spine down and significant hilar and mediastinal lymphadneopathy suggestive of widespread mets.   10/13/20- patient reports improved breathing, weaned O2 to 4L [place].  She is diuresing well which ishelping her breathing.  She responded well to steroids,  inflammatory markers are elevated likely due to active malignancy and CAP(although most of this is likely fluid and unclear how much is actual infection).  She feels strong enough to get up and participate with PT.  She is using IS and able to take tidal volume 500cc today.  I have encouraged to use every hour. I met with son and reviewed care plan.   10/14/20-  Patient had onc tx today. She felt onset of SOB few hours after coming back however vitals stable.   10/15/20- patient had incresed O2 req this am to 5L/min.  I will repeat CXR today.  She was able to eat this am, states overnight she felt worsening SOB post therapy yesterday. Shes making adequate urine overnight, will keep lasix at 40.  Will dc albumin as this can sometimes cause pulm edema and will keep low dose bid solumedrol 20 for now.   10/16/20-  Patient weaned to 4L/min [place]. Reviewed CXR with improved left side still significant airspace and interstitial opacification on right. She had documented acute episode of resp distress with spO2 74%   10/17/20- patient resting in bed in no distress. She is on 4-5L/mn .  Lasix 40 BID with albumin infusing but patient remains with plateau in terms of improvement over past 24-48h.  Continue MEtaneb. PT/OT   10/18/20- despite >2L diuresis overnight patient actually seems to have increased O2 req hower spO2 100% she may be able towean more.  She reports improvement and has no pain or muscle cramping.  She tolerates  BID lasix well with normal renal function today. Plan to continue current scope of therapy from pulm perspective and will consider repeat CT chest for interval changes.    PAST MEDICAL HISTORY   Past Medical History:  Diagnosis Date  . Anemia   . Family history of breast cancer   . Patient denies medical problems      SURGICAL HISTORY   Past Surgical History:  Procedure Laterality Date  . BREAST BIOPSY  06/28/2020   Procedure: BREAST BIOPSY;  Surgeon: Benjamine Sprague, DO;  Location: [facility]  ORS;  Service: General;;  . PORTACATH PLACEMENT N/A 06/28/2020   Procedure: INSERTION PORT-A-CATH;  Surgeon: Benjamine Sprague, DO;  Location: [facility] ORS;  Service: General;  Laterality: N/A;  . TUBAL LIGATION    . VIDEO ASSISTED THORACOSCOPY (VATS)/THOROCOTOMY Right 06/23/2020   Procedure: ATTEMPTED VIDEO ASSISTED THORACOSCOPY (VATS);  Surgeon: Nestor Lewandowsky, MD;  Location: [facility] ORS;  Service: General;  Laterality: Right;     FAMILY HISTORY   Family History  Problem Relation Age of Onset  . Diabetes Other   . Hypertension Other   . Diabetes Maternal Aunt   . Breast cancer Cousin        dx 82s  . Breast cancer Cousin        dx 25s     SOCIAL HISTORY   Social History   Tobacco Use  . Smoking status: Never Smoker  . Smokeless tobacco: Never Used  Vaping Use  . Vaping Use: Never used  Substance Use Topics  . Alcohol use: Not Currently    Alcohol/week: 1.0 standard drink    Types: 1 Cans of beer per week  . Drug use: No     MEDICATIONS    Home Medication:    Current Medication:  Current Facility-Administered Medications:  .  acetaminophen (TYLENOL) tablet 650 mg, 650 mg, Oral, Q6H PRN, Ivor Costa, MD, 650 mg at 10/09/20 2144 .  albumin human 25 % solution 12.5 g, 12.5 g, Intravenous, Daily, Aleskerov, Fuad, MD, Last Rate: 60 mL/hr at 10/18/20 1030, 12.5 g at 10/18/20 1030 .  albuterol (PROVENTIL) (2.5 MG/3ML) 0.083% nebulizer solution 2.5 mg, 2.5 mg, Nebulization, Q6H PRN, Samuella Cota, MD, 2.5 mg at 10/16/20 1612 .  albuterol (VENTOLIN HFA) 108 (90 Base) MCG/ACT inhaler 2 puff, 2 puff, Inhalation, Q4H PRN, Ivor Costa, MD .  amoxicillin-clavulanate (AUGMENTIN) 875-125 MG per tablet 1 tablet, 1 tablet, Oral, Q12H, Ottie Glazier, MD, 1 tablet at 10/18/20 1026 .  bisacodyl (DULCOLAX) suppository 10 mg, 10 mg, Rectal, Daily PRN, Samuella Cota, MD .  calcium carbonate (OS-CAL - dosed in mg of elemental calcium) tablet 1,250 mg, 1,250 mg, Oral, Q breakfast, Ivor Costa, MD, 1,250 mg at 10/18/20 1026 .  Chlorhexidine Gluconate Cloth 2 % PADS 6 each, 6 each, Topical, Daily, Sharen Hones, MD, 6 each at 10/17/20 [phone] .  dextromethorphan-guaiFENesin (MUCINEX DM) 30-600 MG per 12 hr tablet 1 tablet, 1 tablet, Oral, BID PRN, Ivor Costa, MD .  senna (SENOKOT) tablet 17.2 mg, 2 tablet, Oral, BID, 17.2 mg at 10/18/20 1026 **AND** docusate sodium (COLACE) capsule 100 mg, 100 mg, Oral, BID, Samuella Cota, MD, 100 mg at 10/18/20 1026 .  dronabinol (MARINOL) capsule 5 mg, 5 mg, Oral, BID AC, Earlie Server, MD, 5 mg at 10/17/20 1727 .  enoxaparin (LOVENOX) injection 40 mg, 40 mg, Subcutaneous, Q24H, Ivor Costa, MD, 40 mg at 10/17/20 2223 .  feeding supplement (ENSURE ENLIVE / ENSURE PLUS) liquid 237 mL, 237  mL, Oral, TID BM, Sharen Hones, MD, 237 mL at 10/18/20 1023 .  ferrous sulfate tablet 325 mg, 325 mg, Oral, BID WC, Ivor Costa, MD, 325 mg at 10/18/20 1026 .  furosemide (LASIX) injection 40 mg, 40 mg, Intravenous, BID, Lanney Gins, Fuad, MD, 40 mg at 10/18/20 1027 .  lidocaine-prilocaine (EMLA) cream 1 application, 1 application, Topical, PRN, Ivor Costa, MD .  magnesium chloride (SLOW-MAG) 64 MG SR tablet 64 mg, 1 tablet, Oral, Daily, Ivor Costa, MD, 64 mg at 10/18/20 1026 .  melatonin tablet 5 mg, 5 mg, Oral, QHS, Sharen Hones, MD, 5 mg at 10/17/20 2223 .  methylPREDNISolone sodium succinate (SOLU-MEDROL) 40 mg/mL injection 20 mg, 20 mg, Intravenous, Q12H, Lanney Gins, Fuad, MD, 20 mg at 10/18/20 0324 .  metoprolol tartrate (LOPRESSOR) tablet 50 mg, 50 mg, Oral, BID, Ivor Costa, MD, 50 mg at 10/18/20 1025 .  multivitamin with minerals tablet 1 tablet, 1 tablet, Oral, Daily, Samuella Cota, MD, 1 tablet at 10/18/20 1026 .  ondansetron (ZOFRAN) injection 4 mg, 4 mg, Intravenous, Q8H PRN, Ivor Costa, MD, 4 mg at 10/17/20 1309 .  polyethylene glycol (MIRALAX / GLYCOLAX) packet 17 g, 17 g, Oral, BID, Samuella Cota, MD, 17 g at 10/18/20 1024 .  potassium chloride SA  (KLOR-CON) CR tablet 20 mEq, 20 mEq, Oral, Daily, Earlie Server, MD, 20 mEq at 10/18/20 1039 .  scopolamine (TRANSDERM-SCOP) 1 MG/3DAYS 1.5 mg, 1 patch, Transdermal, Q72H, Earlie Server, MD, 1.5 mg at 10/17/20 1437 .  simethicone (MYLICON) chewable tablet 80 mg, 80 mg, Oral, Daily PRN, Earlie Server, MD, 80 mg at 10/15/20 1002 .  sodium chloride (OCEAN) 0.65 % nasal spray 1 spray, 1 spray, Each Nare, PRN, Samuella Cota, MD .  sodium chloride flush (NS) 0.9 % injection 10-40 mL, 10-40 mL, Intracatheter, Q12H, Sharen Hones, MD, 10 mL at 10/18/20 1039 .  sodium chloride flush (NS) 0.9 % injection 10-40 mL, 10-40 mL, Intracatheter, PRN, Sharen Hones, MD, 10 mL at 10/10/20 1447    ALLERGIES   Patient has no known allergies.     REVIEW OF SYSTEMS    Review of Systems:  Gen:  Denies  fever, sweats, chills weigh loss  HEENT: Denies blurred vision, double vision, ear pain, eye pain, hearing loss, nose bleeds, sore throat Cardiac:  No dizziness, chest pain or heaviness, chest tightness,edema Resp:   Denies cough or sputum porduction, shortness of breath,wheezing, hemoptysis,  Gi: Denies swallowing difficulty, stomach pain, nausea or vomiting, diarrhea, constipation, bowel incontinence Gu:  Denies bladder incontinence, burning urine Ext:   Denies Joint pain, stiffness or swelling Skin: Denies  skin rash, easy bruising or bleeding or hives Endoc:  Denies polyuria, polydipsia , polyphagia or weight change Psych:   Denies depression, insomnia or hallucinations   Other:  All other systems negative   VS: BP 117/74 (BP Location: Left Arm)   Pulse (!) 108   Temp 97.9 F (36.6 C) (Oral)   Resp 18   Ht _0  (1.778 m)   Wt 81.2 kg   LMP 06/16/2015 Comment: Tubal Ligation   SpO2 98%   BMI 25.68 kg/m      PHYSICAL EXAM    GENERAL:NAD, no fevers, chills, no weakness no fatigue HEAD: Normocephalic, atraumatic.  EYES: Pupils equal, round, reactive to light. Extraocular muscles intact. No scleral  icterus.  MOUTH: Moist mucosal membrane. Dentition intact. No abscess noted.  EAR, NOSE, THROAT: Clear without exudates. No external lesions.  NECK: Supple. No thyromegaly. No nodules.  No JVD.  PULMONARY: Diffuse coarse rhonchi right sided +wheezes CARDIOVASCULAR: S1 and S2. Regular rate and rhythm. No murmurs, rubs, or gallops. No edema. Pedal pulses 2+ bilaterally.  GASTROINTESTINAL: Soft, nontender, nondistended. No masses. Positive bowel sounds. No hepatosplenomegaly.  MUSCULOSKELETAL: No swelling, clubbing, or edema. Range of motion full in all extremities.  NEUROLOGIC: Cranial nerves II through XII are intact. No gross focal neurological deficits. Sensation intact. Reflexes intact.  SKIN: No ulceration, lesions, rashes, or cyanosis. Skin warm and dry. Turgor intact.  PSYCHIATRIC: Mood, affect within normal limits. The patient is awake, alert and oriented x 3. Insight, judgment intact.       IMAGING    DG Chest 2 View  Result Date: 10/07/2020 CLINICAL DATA:  Hypoxia, shortness of breath, metastatic breast cancer EXAM: CHEST - 2 VIEW COMPARISON:  09/13/2020 FINDINGS: LEFT subclavian Port-A-Cath with tip projecting over RIGHT atrium. Enlargement of cardiac silhouette. Pulmonary vascular congestion. Persistent enlargement of RIGHT hilum question mass or adenopathy. Increased infiltrate of the mid to lower RIGHT lung. Small RIGHT pleural effusion again seen. Accentuation of markings in LEFT perihilar region little changed. No pneumothorax or acute osseous findings. IMPRESSION: Increased RIGHT perihilar infiltrate with persistent small RIGHT pleural effusion. Enlargement of RIGHT pulmonary hilum by adenopathy versus perihilar mass. Electronically Signed   By: Lavonia Dana M.D.   On: 10/07/2020 11:32   CT Angio Chest PE W and/or Wo Contrast  Result Date: 10/07/2020 CLINICAL DATA:  Breast carcinoma. Tachycardia and shortness of breath EXAM: CT ANGIOGRAPHY CHEST WITH CONTRAST TECHNIQUE:  Multidetector CT imaging of the chest was performed using the standard protocol during bolus administration of intravenous contrast. Multiplanar CT image reconstructions and MIPs were obtained to evaluate the vascular anatomy. CONTRAST:  1m OMNIPAQUE IOHEXOL 350 MG/ML SOLN COMPARISON:  CT angiogram chest June 15, 2020; chest radiograph October 07, 2020 FINDINGS: Cardiovascular: There is no evident pulmonary embolus. No appreciable thoracic aortic aneurysm or dissection. Visualized great vessels appear unremarkable. There is no appreciable pericardial effusion or pericardial thickening. Port-A-Cath tip is in the superior vena cava near the cavoatrial junction. Mediastinum/Nodes: Thyroid appears normal. There are multiple axillary lymph nodes bilaterally, more severe on the right than on the left. The largest lymph node is seen in the right axillary region measuring 2.7 x 2.1 cm. There is adenopathy in the right supraclavicular and infraclavicular regions with extension of adenopathy into the right Peri carinal region. Largest individual lymph node in these areas measures 2.6 x 1.8 cm. Several left supraclavicular lymph nodes are also noted, largest measuring 1.7 x 1.3 cm. There are several subcentimeter mediastinal lymph nodes. There is extensive retrocrural adenopathy. The largest lymph node or collection of matted lymph nodes is seen inferior to the aorta on the left posteriorly measuring 3.4 x 2.1 cm. No esophageal lesions are evident. Lungs/Pleura: There is extensive airspace consolidation in portions of the right middle and lower lobes with interspersed areas of loculated pleural effusion. There is ill-defined airspace opacity consistent with pneumonia in the left upper lobe. Loculated fluid tracks along the left major fissure. There may well be associated metastasis within the fissure given the extensive nodularity in this area. There is a small left pleural effusion with left base atelectasis. Pleural  metastases noted along each upper hemithorax, primarily posteriorly as well as in the right apex. Upper Abdomen: Retrocrural adenopathy extends into the upper abdominal region. Visualized upper abdominal structures otherwise appear unremarkable. Musculoskeletal: There is a large mass occupying the right breast and chest wall region  with invasion of the deep muscles of the chest wall on the right. There is also invasion of the right axilla. There is extensive subcutaneous thickening in both breast regions. There is a mass in the lateral left breast with extension of soft tissue prominence along the lateral left hemithorax, likely due to extension of neoplasm into the adjacent soft tissues. There is widespread sclerotic bony metastatic disease throughout the thoracic region. Review of the MIP images confirms the above findings. IMPRESSION: 1. No demonstrable pulmonary embolus. No thoracic aortic aneurysm or dissection. 2. Areas of airspace consolidation noted in the right middle and lower lobes. More ill-defined infiltrate is noted in the left upper lobe anteriorly. Areas of loculated fluid noted in the left major fissure. Nodularity in this area could indicate superimposed masses in left major fissure. There is pleural thickening along both posterior upper hemithorax regions, likely pleural metastases. Loculated fluid noted in areas of infiltrate on the right. Small left pleural effusion noted. 3. Adenopathy at multiple sites, most severe in the retrocrural regions bilaterally, right supraclavicular region, and right axillary region, although there is left axillary and left supraclavicular adenopathy. 4. Large mass lesions occupying the right breast with extension and invasion of the chest wall and right axillary regions. A lesser degree of metastatic disease to the left of midline in the subcutaneous tissues and lateral left breast/lateral left chest wall soft tissues noted. 5. Multifocal sclerotic bony metastatic  disease throughout the thoracic region noted. Electronically Signed   By: Lowella Grip III M.D.   On: 10/07/2020 13:23   US Venous Img Upper Uni Right(DVT)  Result Date: 10/08/2020 CLINICAL DATA:  History of stage IV breast cancer now with right upper extremity pain and edema. Evaluate for DVT. EXAM: RIGHT UPPER EXTREMITY VENOUS DOPPLER ULTRASOUND TECHNIQUE: Gray-scale sonography with graded compression, as well as color Doppler and duplex ultrasound were performed to evaluate the upper extremity deep venous system from the level of the subclavian vein and including the jugular, axillary, basilic, radial, ulnar and upper cephalic vein. Spectral Doppler was utilized to evaluate flow at rest and with distal augmentation maneuvers. COMPARISON:  None. FINDINGS: Contralateral Subclavian Vein: Respiratory phasicity is normal and symmetric with the symptomatic side. No evidence of thrombus. Normal compressibility. Internal Jugular Vein: No evidence of thrombus. Normal compressibility, respiratory phasicity and response to augmentation. Subclavian Vein: No evidence of thrombus. Normal compressibility, respiratory phasicity and response to augmentation. Axillary Vein: Appears patent where imaged though evaluation degraded secondary to patient's discomfort with sonographic evaluation of the axilla. Cephalic Vein: No evidence of thrombus. Normal compressibility, respiratory phasicity and response to augmentation. Basilic Vein: No evidence of thrombus. Normal compressibility, respiratory phasicity and response to augmentation. Brachial Veins: No evidence of thrombus within either of the paired brachial veins. Normal compressibility, respiratory phasicity and response to augmentation. Radial Veins: No evidence of thrombus. Normal compressibility, respiratory phasicity and response to augmentation. Ulnar Veins: No evidence of thrombus. Normal compressibility, respiratory phasicity and response to augmentation. Venous  Reflux:  None visualized. Other Findings: There is a moderate amount of subcutaneous edema at the level of the right upper arm. IMPRESSION: No evidence of DVT within the right upper extremity. Electronically Signed   By: Sandi Mariscal M.D.   On: 10/08/2020 14:29   DG Chest Port 1 View  Result Date: 10/15/2020 CLINICAL DATA:  Pneumonia and pleural effusion. Metastatic breast cancer. EXAM: PORTABLE CHEST 1 VIEW COMPARISON:  10/13/2020 chest radiograph and prior studies FINDINGS: Cardiomediastinal silhouette is unchanged with RIGHT  mediastinal fullness. A LEFT subclavian Port-A-Cath with tip overlying the UPPER RIGHT atrium again noted. Scattered opacities within both lungs are again noted with a RIGHT pleural effusion. There is no evidence of pneumothorax. No significant changes are identified. IMPRESSION: Unchanged appearance of the chest with scattered opacities within both lungs and RIGHT pleural effusion. Electronically Signed   By: Margarette Canada M.D.   On: 10/15/2020 11:06   DG Chest Port 1 View  Result Date: 10/13/2020 CLINICAL DATA:  Pleural effusion EXAM: PORTABLE CHEST 1 VIEW COMPARISON:  10/07/2020 FINDINGS: Unchanged positioning of left-sided Port-A-Cath. Stable cardiomediastinal contours. Persistent small right pleural effusion. Slight progression of airspace opacity throughout the right lung most pronounced within the right lung base. Similar interstitial opacities throughout the left lung. No pneumothorax. IMPRESSION: 1. Slight progression of airspace opacity throughout the right lung, most pronounced within the right lung base. 2. Persistent small right pleural effusion. Electronically Signed   By: Davina Poke D.O.   On: 10/13/2020 14:10   ECHOCARDIOGRAM COMPLETE  Result Date: 10/10/2020    ECHOCARDIOGRAM REPORT   Patient Name:   DENNYS TRAUGHBER Date of Exam: 10/10/2020 Medical Rec #:  [id number]           Height:       70.0 in Accession #:    [id number]          Weight:       178.7 lb Date  of Birth:  May 14, 1966           BSA:          1.990 m Patient Age:    75 years            BP:           119/79 mmHg Patient Gender: F                   HR:           105 bpm. Exam Location:  [facility] Procedure: 2D Echo, Limited Color Doppler and Cardiac Doppler Indications:     R06.03 Acute Respiratory Distress  History:         Patient has no prior history of Echocardiogram examinations.                  Signs/Symptoms:Shortness of Breath. Breast cancer.  Sonographer:     Charmayne Sheer RDCS (AE) Referring Phys:  [id number] Sharen Hones Diagnosing Phys: Kathlyn Sacramento MD  Sonographer Comments: Technically challenging study due to limited acoustic windows. IMPRESSIONS  1. Left ventricular ejection fraction, by estimation, is 50 to 55%. The left ventricle has low normal function. Left ventricular endocardial border not optimally defined to evaluate regional wall motion. Left ventricular diastolic parameters are indeterminate.  2. Right ventricular systolic function is normal. The right ventricular size is normal. Tricuspid regurgitation signal is inadequate for assessing PA pressure.  3. The mitral valve is normal in structure. No evidence of mitral valve regurgitation. No evidence of mitral stenosis.  4. The aortic valve is normal in structure. Aortic valve regurgitation is not visualized. No aortic stenosis is present.  5. The inferior vena cava is normal in size with greater than 50% respiratory variability, suggesting right atrial pressure of 3 mmHg. FINDINGS  Left Ventricle: Left ventricular ejection fraction, by estimation, is 50 to 55%. The left ventricle has low normal function. Left ventricular endocardial border not optimally defined to evaluate regional wall motion. The left ventricular internal cavity  size was normal in size. There is  no left ventricular hypertrophy. Left ventricular diastolic parameters are indeterminate. Right Ventricle: The right ventricular size is normal. No increase in right ventricular wall  thickness. Right ventricular systolic function is normal. Tricuspid regurgitation signal is inadequate for assessing PA pressure. Left Atrium: Left atrial size was normal in size. Right Atrium: Right atrial size was normal in size. Pericardium: There is no evidence of pericardial effusion. Mitral Valve: The mitral valve is normal in structure. No evidence of mitral valve regurgitation. No evidence of mitral valve stenosis. MV peak gradient, 4.5 mmHg. The mean mitral valve gradient is 2.0 mmHg. Tricuspid Valve: The tricuspid valve is normal in structure. Tricuspid valve regurgitation is not demonstrated. No evidence of tricuspid stenosis. Aortic Valve: The aortic valve is normal in structure. Aortic valve regurgitation is not visualized. No aortic stenosis is present. Aortic valve mean gradient measures 4.0 mmHg. Aortic valve peak gradient measures 6.8 mmHg. Pulmonic Valve: The pulmonic valve was not well visualized. Pulmonic valve regurgitation is not visualized. No evidence of pulmonic stenosis. Aorta: The aortic root is normal in size and structure. Venous: The inferior vena cava is normal in size with greater than 50% respiratory variability, suggesting right atrial pressure of 3 mmHg. IAS/Shunts: No atrial level shunt detected by color flow Doppler.   LV Volumes (MOD) LV vol d, MOD A4C: 99.4 ml Diastology LV vol s, MOD A4C: 58.3 ml LV e' lateral:   10.30 cm/s LV SV MOD A4C:     99.4 ml LV E/e' lateral: 5.8  LEFT ATRIUM           Index LA Vol (A4C): 41.5 ml 20.86 ml/m  AORTIC VALVE AV Vmax:           130.00 cm/s AV Vmean:          93.400 cm/s AV VTI:            0.243 m AV Peak Grad:      6.8 mmHg AV Mean Grad:      4.0 mmHg LVOT Vmax:         92.70 cm/s LVOT Vmean:        60.500 cm/s LVOT VTI:          0.151 m LVOT/AV VTI ratio: 0.62 MITRAL VALVE MV Area (PHT): 6.96 cm    SHUNTS MV Peak grad:  4.5 mmHg    Systemic VTI: 0.15 m MV Mean grad:  2.0 mmHg MV Vmax:       1.06 m/s MV Vmean:      67.9 cm/s MV Decel  Time: 109 msec MV E velocity: 59.60 cm/s MV A velocity: 68.60 cm/s MV E/A ratio:  0.87 Kathlyn Sacramento MD Electronically signed by Kathlyn Sacramento MD Signature Date/Time: 10/10/2020/2:00:06 PM    Final          ASSESSMENT/PLAN   Malignant pleural effusion    - since patient had initial therapeutic response to thoracentesis she will likely benefit from permament tunneled pleural catheter via IR service as palliative option for malignant effusion due to high probability of rapid re-accumulation    -there is no renal failure, hepatic synthetic function within reference range without HASH/cirhosis, no CHF per most recent TTE   - no lymphadema  Metastatic lung cancer   -Currently being followed by oncology Dr Tasia Catchings - appreciate input     Compressive atelectasis   Worse at Right lowe lobe - patient is weak and unlikely to be able to perform well with incentive spirometry.  She is a good candidate  for albuterol infused Metaneb therapy can do BID to help with recruitment.     Chronic deconditioning and severe protein calorie malnutrition     -nutritional and PT evaluation     -palliative care on case - appreciate input     -bitemporal wasting noted on examination     - high risk refeeding syndrome - monitor electorlytes - will place consult for pharmD and RD     Acute hypoxemic respiratory failure    - patient generally not on supplemental O2 currently on 7L/min supplemental O2>>4>>5- 10/15/20    - she has leukocytosis but gram stain is negative on pleural fluid and there is no obvious infiltrate although basis bilaterally may have pneumonia, this is obscured by effusion and atelectatic sement.  Legionella and strep pneumo ag is negative. I feel that CAP is less likely.  Have ordered iflammatory markers and PCT.      - lasix to help with residual effusion     -Soluedrol 20 bid IV>>prednisone taper 10/18/20     - lasix 40 iv daily >>bid 10/16/20     - albumin 86m 25% 1 amd daily with diuresis     -  chest physitherapy to recruit atelectatic lungs     -etiology is all of the above conditions     - TTE with no findings to suggest CHF realted hypoxemia  -repeat cxr 10/15/20- reviewed     Thank you for allowing me to participate in the care of this patient.   Patient/Family are satisfied with care plan and all questions have been answered.  This document was prepared using Dragon voice recognition software and may include unintentional dictation errors.     FOttie Glazier M.D.  Division of [facility]

## 2020-10-18 NOTE — Progress Notes (Signed)
[place]  Telephone:([phone] [phone]   Name: Jessica Willis Date: 10/18/2020 MRN: [id number]  DOB: [birth year]  Patient Care Team: [place] as PCP - Lester Kinsman, Linna Darner, RN as Registered Nurse Benjamine Sprague, DO as Consulting Physician (Surgery)    REASON FOR CONSULTATION: Jessica Willis is a 55 y.o. 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  CT of the chest revealed evidence of disease progression with worse adenopathy.  Additionally, her necrotic breast mass appears to be enlarging on treatment.  Palliative care was consulted help address goals.   CODE STATUS: DNR  PAST MEDICAL HISTORY: Past Medical History:  Diagnosis Date  . Anemia   . Family history of breast cancer   . Patient denies medical problems     PAST SURGICAL HISTORY:  Past Surgical History:  Procedure Laterality Date  . BREAST BIOPSY  06/28/2020   Procedure: BREAST BIOPSY;  Surgeon: Benjamine Sprague, DO;  Location: [facility] ORS;  Service: General;;  . PORTACATH PLACEMENT N/A 06/28/2020   Procedure: INSERTION PORT-A-CATH;  Surgeon: Benjamine Sprague, DO;  Location: [facility] ORS;  Service: General;  Laterality: N/A;  . TUBAL LIGATION    . VIDEO ASSISTED THORACOSCOPY (VATS)/THOROCOTOMY Right 06/23/2020   Procedure: ATTEMPTED VIDEO ASSISTED THORACOSCOPY (VATS);  Surgeon: Nestor Lewandowsky, MD;  Location: [facility] ORS;  Service: General;  Laterality: Right;    HEMATOLOGY/ONCOLOGY HISTORY:  Oncology History  Metastatic breast cancer ([place])  06/21/2020 Initial Diagnosis   Metastatic breast cancer ([place])   06/28/2020 Cancer Staging   Staging form: Breast, AJCC 8th Edition - Clinical stage from 06/28/2020: Stage IV  (cT4c, cM1, G3, ER-, PR-, HER2-) - Signed by Earlie Server, MD on 08/11/2020   06/29/2020 - 09/22/2020 Chemotherapy          Genetic Testing   Negative genetic testing. No pathogenic variants identified on the [facility] Common Hereditary Cancers Panel. The report date is 07/25/2020.  The Common Hereditary Cancers Panel offered by [facility] includes sequencing and/or deletion duplication testing of the following 48 genes: APC, ATM, AXIN2, BARD1, BMPR1A, BRCA1, BRCA2, BRIP1, CDH1, CDKN2A (p14ARF), CDKN2A (p16INK4a), CKD4, CHEK2, CTNNA1, DICER1, EPCAM (Deletion/duplication testing only), GREM1 (promoter region deletion/duplication testing only), KIT, MEN1, MLH1, MSH2, MSH3, MSH6, MUTYH, NBN, NF1, NHTL1, PALB2, PDGFRA, PMS2, POLD1, POLE, PTEN, RAD50, RAD51C, RAD51D, RNF43, SDHB, SDHC, SDHD, SMAD4, SMARCA4. STK11, TP53, TSC1, TSC2, and VHL.  The following genes were evaluated for sequence changes only: SDHA and HOXB13 c.251G>A variant only.   10/14/2020 -  Chemotherapy    Patient is on Treatment Plan: BREAST DOXORUBICIN Q21D        ALLERGIES:  has No Known Allergies.  MEDICATIONS:  Current Facility-Administered Medications  Medication Dose Route Frequency Provider Last Rate Last Admin  . acetaminophen (TYLENOL) tablet 650 mg  650 mg Oral Q6H PRN Ivor Costa, MD   650 mg at 10/09/20 2144  . albumin human 25 % solution 12.5 g  12.5 g Intravenous Daily Ottie Glazier, MD 60 mL/hr at 10/17/20 0849 12.5 g at 10/17/20 0849  . albuterol (PROVENTIL) (2.5 MG/3ML) 0.083% nebulizer solution 2.5 mg  2.5 mg Nebulization Q6H PRN Samuella Cota, MD   2.5 mg at 10/16/20 1612  . albuterol (VENTOLIN HFA) 108 (90 Base) MCG/ACT inhaler 2 puff  2 puff Inhalation  Q4H PRN Ivor Costa, MD      . amoxicillin-clavulanate (AUGMENTIN) 875-125 MG per tablet 1 tablet  1 tablet Oral Q12H Ottie Glazier, MD   1 tablet at 10/17/20 2223  . bisacodyl (DULCOLAX) suppository 10 mg  10 mg Rectal Daily PRN Samuella Cota, MD      .  calcium carbonate (OS-CAL - dosed in mg of elemental calcium) tablet 1,250 mg  1,250 mg Oral Q breakfast Ivor Costa, MD   1,250 mg at 10/17/20 6269  . Chlorhexidine Gluconate Cloth 2 % PADS 6 each  6 each Topical Daily Sharen Hones, MD   6 each at 10/17/20 [phone]  . dextromethorphan-guaiFENesin (MUCINEX DM) 30-600 MG per 12 hr tablet 1 tablet  1 tablet Oral BID PRN Ivor Costa, MD      . senna (SENOKOT) tablet 17.2 mg  2 tablet Oral BID Samuella Cota, MD   17.2 mg at 10/17/20 2223   And  . docusate sodium (COLACE) capsule 100 mg  100 mg Oral BID Samuella Cota, MD   100 mg at 10/17/20 2223  . dronabinol (MARINOL) capsule 5 mg  5 mg Oral BID Noralee Chars, MD   5 mg at 10/17/20 1727  . enoxaparin (LOVENOX) injection 40 mg  40 mg Subcutaneous Q24H Ivor Costa, MD   40 mg at 10/17/20 2223  . feeding supplement (ENSURE ENLIVE / ENSURE PLUS) liquid 237 mL  237 mL Oral TID BM Sharen Hones, MD   237 mL at 10/17/20 2223  . ferrous sulfate tablet 325 mg  325 mg Oral BID WC Ivor Costa, MD   325 mg at 10/17/20 1727  . furosemide (LASIX) injection 40 mg  40 mg Intravenous BID Ottie Glazier, MD   40 mg at 10/17/20 1727  . lidocaine-prilocaine (EMLA) cream 1 application  1 application Topical PRN Ivor Costa, MD      . magnesium chloride (SLOW-MAG) 64 MG SR tablet 64 mg  1 tablet Oral Daily Ivor Costa, MD   64 mg at 10/17/20 6270  . melatonin tablet 5 mg  5 mg Oral QHS Sharen Hones, MD   5 mg at 10/17/20 2223  . methylPREDNISolone sodium succinate (SOLU-MEDROL) 40 mg/mL injection 20 mg  20 mg Intravenous Q12H Ottie Glazier, MD   20 mg at 10/18/20 0324  . metoprolol tartrate (LOPRESSOR) tablet 50 mg  50 mg Oral BID Ivor Costa, MD   50 mg at 10/17/20 2223  . multivitamin with minerals tablet 1 tablet  1 tablet Oral Daily Samuella Cota, MD   1 tablet at 10/17/20 3500  . ondansetron (ZOFRAN) injection 4 mg  4 mg Intravenous Q8H PRN Ivor Costa, MD   4 mg at 10/17/20 1309  . polyethylene glycol (MIRALAX /  GLYCOLAX) packet 17 g  17 g Oral BID Samuella Cota, MD   17 g at 10/17/20 2223  . potassium chloride SA (KLOR-CON) CR tablet 20 mEq  20 mEq Oral Daily Earlie Server, MD   20 mEq at 10/17/20 9381  . scopolamine (TRANSDERM-SCOP) 1 MG/3DAYS 1.5 mg  1 patch Transdermal Royetta Car, MD   1.5 mg at 10/17/20 1437  . simethicone (MYLICON) chewable tablet 80 mg  80 mg Oral Daily PRN Earlie Server, MD   80 mg at 10/15/20 1002  . sodium chloride (OCEAN) 0.65 % nasal spray 1 spray  1 spray Each Nare PRN Samuella Cota, MD      . sodium chloride flush (NS) 0.9 %  injection 10-40 mL  10-40 mL Intracatheter Q12H Sharen Hones, MD   10 mL at 10/17/20 2233  . sodium chloride flush (NS) 0.9 % injection 10-40 mL  10-40 mL Intracatheter PRN Sharen Hones, MD   10 mL at 10/10/20 1447    VITAL SIGNS: BP 117/74 (BP Location: Left Arm)   Pulse (!) 108   Temp 97.9 F (36.6 C) (Oral)   Resp 18   Ht 5' 10"  (1.778 m)   Wt 179 lb (81.2 kg)   LMP 06/16/2015 Comment: Tubal Ligation   SpO2 98%   BMI 25.68 kg/m  Filed Weights   10/14/20 0349 10/15/20 0405 10/16/20 0100  Weight: 173 lb 14.4 oz (78.9 kg) 174 lb 4.8 oz (79.1 kg) 179 lb (81.2 kg)    Estimated body mass index is 25.68 kg/m as calculated from the following:   Height as of this encounter: 5' 10"  (1.778 m).   Weight as of this encounter: 179 lb (81.2 kg).  LABS: CBC:    Component Value Date/Time   WBC 20.4 (H) 10/18/2020 0647   HGB 9.1 (L) 10/18/2020 0647   HCT 30.9 (L) 10/18/2020 0647   PLT 351 10/18/2020 0647   MCV 85.1 10/18/2020 0647   NEUTROABS 19.4 (H) 10/18/2020 0647   LYMPHSABS 0.5 (L) 10/18/2020 0647   MONOABS 0.1 10/18/2020 0647   EOSABS 0.0 10/18/2020 0647   BASOSABS 0.0 10/18/2020 0647   Comprehensive Metabolic Panel:    Component Value Date/Time   NA 142 10/18/2020 0647   K 4.5 10/18/2020 0647   CL 97 (L) 10/18/2020 0647   CO2 35 (H) 10/18/2020 0647   BUN 23 (H) 10/18/2020 0647   CREATININE 0.45 10/18/2020 0647   GLUCOSE 94  10/18/2020 0647   CALCIUM 8.3 (L) 10/18/2020 0647   AST 55 (H) 10/14/2020 0517   ALT 16 10/14/2020 0517   ALKPHOS 157 (H) 10/14/2020 0517   BILITOT 0.4 10/14/2020 0517   PROT 6.1 (L) 10/14/2020 0517   ALBUMIN 2.6 (L) 10/15/2020 0608    RADIOGRAPHIC STUDIES: DG Chest 2 View  Result Date: 10/07/2020 CLINICAL DATA:  Hypoxia, shortness of breath, metastatic breast cancer EXAM: CHEST - 2 VIEW COMPARISON:  09/13/2020 FINDINGS: LEFT subclavian Port-A-Cath with tip projecting over RIGHT atrium. Enlargement of cardiac silhouette. Pulmonary vascular congestion. Persistent enlargement of RIGHT hilum question mass or adenopathy. Increased infiltrate of the mid to lower RIGHT lung. Small RIGHT pleural effusion again seen. Accentuation of markings in LEFT perihilar region little changed. No pneumothorax or acute osseous findings. IMPRESSION: Increased RIGHT perihilar infiltrate with persistent small RIGHT pleural effusion. Enlargement of RIGHT pulmonary hilum by adenopathy versus perihilar mass. Electronically Signed   By: Lavonia Dana M.D.   On: 10/07/2020 11:32   CT Angio Chest PE W and/or Wo Contrast  Result Date: 10/07/2020 CLINICAL DATA:  Breast carcinoma. Tachycardia and shortness of breath EXAM: CT ANGIOGRAPHY CHEST WITH CONTRAST TECHNIQUE: Multidetector CT imaging of the chest was performed using the standard protocol during bolus administration of intravenous contrast. Multiplanar CT image reconstructions and MIPs were obtained to evaluate the vascular anatomy. CONTRAST:  46m OMNIPAQUE IOHEXOL 350 MG/ML SOLN COMPARISON:  CT angiogram chest June 15, 2020; chest radiograph October 07, 2020 FINDINGS: Cardiovascular: There is no evident pulmonary embolus. No appreciable thoracic aortic aneurysm or dissection. Visualized great vessels appear unremarkable. There is no appreciable pericardial effusion or pericardial thickening. Port-A-Cath tip is in the superior vena cava near the cavoatrial junction.  Mediastinum/Nodes: Thyroid appears normal. There are multiple  axillary lymph nodes bilaterally, more severe on the right than on the left. The largest lymph node is seen in the right axillary region measuring 2.7 x 2.1 cm. There is adenopathy in the right supraclavicular and infraclavicular regions with extension of adenopathy into the right Peri carinal region. Largest individual lymph node in these areas measures 2.6 x 1.8 cm. Several left supraclavicular lymph nodes are also noted, largest measuring 1.7 x 1.3 cm. There are several subcentimeter mediastinal lymph nodes. There is extensive retrocrural adenopathy. The largest lymph node or collection of matted lymph nodes is seen inferior to the aorta on the left posteriorly measuring 3.4 x 2.1 cm. No esophageal lesions are evident. Lungs/Pleura: There is extensive airspace consolidation in portions of the right middle and lower lobes with interspersed areas of loculated pleural effusion. There is ill-defined airspace opacity consistent with pneumonia in the left upper lobe. Loculated fluid tracks along the left major fissure. There may well be associated metastasis within the fissure given the extensive nodularity in this area. There is a small left pleural effusion with left base atelectasis. Pleural metastases noted along each upper hemithorax, primarily posteriorly as well as in the right apex. Upper Abdomen: Retrocrural adenopathy extends into the upper abdominal region. Visualized upper abdominal structures otherwise appear unremarkable. Musculoskeletal: There is a large mass occupying the right breast and chest wall region with invasion of the deep muscles of the chest wall on the right. There is also invasion of the right axilla. There is extensive subcutaneous thickening in both breast regions. There is a mass in the lateral left breast with extension of soft tissue prominence along the lateral left hemithorax, likely due to extension of neoplasm into the  adjacent soft tissues. There is widespread sclerotic bony metastatic disease throughout the thoracic region. Review of the MIP images confirms the above findings. IMPRESSION: 1. No demonstrable pulmonary embolus. No thoracic aortic aneurysm or dissection. 2. Areas of airspace consolidation noted in the right middle and lower lobes. More ill-defined infiltrate is noted in the left upper lobe anteriorly. Areas of loculated fluid noted in the left major fissure. Nodularity in this area could indicate superimposed masses in left major fissure. There is pleural thickening along both posterior upper hemithorax regions, likely pleural metastases. Loculated fluid noted in areas of infiltrate on the right. Small left pleural effusion noted. 3. Adenopathy at multiple sites, most severe in the retrocrural regions bilaterally, right supraclavicular region, and right axillary region, although there is left axillary and left supraclavicular adenopathy. 4. Large mass lesions occupying the right breast with extension and invasion of the chest wall and right axillary regions. A lesser degree of metastatic disease to the left of midline in the subcutaneous tissues and lateral left breast/lateral left chest wall soft tissues noted. 5. Multifocal sclerotic bony metastatic disease throughout the thoracic region noted. Electronically Signed   By: Lowella Grip III M.D.   On: 10/07/2020 13:23   US Venous Img Upper Uni Right(DVT)  Result Date: 10/08/2020 CLINICAL DATA:  History of stage IV breast cancer now with right upper extremity pain and edema. Evaluate for DVT. EXAM: RIGHT UPPER EXTREMITY VENOUS DOPPLER ULTRASOUND TECHNIQUE: Gray-scale sonography with graded compression, as well as color Doppler and duplex ultrasound were performed to evaluate the upper extremity deep venous system from the level of the subclavian vein and including the jugular, axillary, basilic, radial, ulnar and upper cephalic vein. Spectral Doppler was  utilized to evaluate flow at rest and with distal augmentation maneuvers. COMPARISON:  None. FINDINGS: Contralateral Subclavian Vein: Respiratory phasicity is normal and symmetric with the symptomatic side. No evidence of thrombus. Normal compressibility. Internal Jugular Vein: No evidence of thrombus. Normal compressibility, respiratory phasicity and response to augmentation. Subclavian Vein: No evidence of thrombus. Normal compressibility, respiratory phasicity and response to augmentation. Axillary Vein: Appears patent where imaged though evaluation degraded secondary to patient's discomfort with sonographic evaluation of the axilla. Cephalic Vein: No evidence of thrombus. Normal compressibility, respiratory phasicity and response to augmentation. Basilic Vein: No evidence of thrombus. Normal compressibility, respiratory phasicity and response to augmentation. Brachial Veins: No evidence of thrombus within either of the paired brachial veins. Normal compressibility, respiratory phasicity and response to augmentation. Radial Veins: No evidence of thrombus. Normal compressibility, respiratory phasicity and response to augmentation. Ulnar Veins: No evidence of thrombus. Normal compressibility, respiratory phasicity and response to augmentation. Venous Reflux:  None visualized. Other Findings: There is a moderate amount of subcutaneous edema at the level of the right upper arm. IMPRESSION: No evidence of DVT within the right upper extremity. Electronically Signed   By: Sandi Mariscal M.D.   On: 10/08/2020 14:29   DG Chest Port 1 View  Result Date: 10/15/2020 CLINICAL DATA:  Pneumonia and pleural effusion. Metastatic breast cancer. EXAM: PORTABLE CHEST 1 VIEW COMPARISON:  10/13/2020 chest radiograph and prior studies FINDINGS: Cardiomediastinal silhouette is unchanged with RIGHT mediastinal fullness. A LEFT subclavian Port-A-Cath with tip overlying the UPPER RIGHT atrium again noted. Scattered opacities within both  lungs are again noted with a RIGHT pleural effusion. There is no evidence of pneumothorax. No significant changes are identified. IMPRESSION: Unchanged appearance of the chest with scattered opacities within both lungs and RIGHT pleural effusion. Electronically Signed   By: Margarette Canada M.D.   On: 10/15/2020 11:06   DG Chest Port 1 View  Result Date: 10/13/2020 CLINICAL DATA:  Pleural effusion EXAM: PORTABLE CHEST 1 VIEW COMPARISON:  10/07/2020 FINDINGS: Unchanged positioning of left-sided Port-A-Cath. Stable cardiomediastinal contours. Persistent small right pleural effusion. Slight progression of airspace opacity throughout the right lung most pronounced within the right lung base. Similar interstitial opacities throughout the left lung. No pneumothorax. IMPRESSION: 1. Slight progression of airspace opacity throughout the right lung, most pronounced within the right lung base. 2. Persistent small right pleural effusion. Electronically Signed   By: Davina Poke D.O.   On: 10/13/2020 14:10   ECHOCARDIOGRAM COMPLETE  Result Date: 10/10/2020    ECHOCARDIOGRAM REPORT   Patient Name:   ARIANNAH ARENSON Date of Exam: 10/10/2020 Medical Rec #:  [id number]           Height:       70.0 in Accession #:    [id number]          Weight:       178.7 lb Date of Birth:  Apr 18, 1966           BSA:          1.990 m Patient Age:    45 years            BP:           119/79 mmHg Patient Gender: F                   HR:           105 bpm. Exam Location:  [facility] Procedure: 2D Echo, Limited Color Doppler and Cardiac Doppler Indications:     R06.03 Acute Respiratory Distress  History:  Patient has no prior history of Echocardiogram examinations.                  Signs/Symptoms:Shortness of Breath. Breast cancer.  Sonographer:     Charmayne Sheer RDCS (AE) Referring Phys:  [id number] Sharen Hones Diagnosing Phys: Kathlyn Sacramento MD  Sonographer Comments: Technically challenging study due to limited acoustic windows. IMPRESSIONS  1. Left  ventricular ejection fraction, by estimation, is 50 to 55%. The left ventricle has low normal function. Left ventricular endocardial border not optimally defined to evaluate regional wall motion. Left ventricular diastolic parameters are indeterminate.  2. Right ventricular systolic function is normal. The right ventricular size is normal. Tricuspid regurgitation signal is inadequate for assessing PA pressure.  3. The mitral valve is normal in structure. No evidence of mitral valve regurgitation. No evidence of mitral stenosis.  4. The aortic valve is normal in structure. Aortic valve regurgitation is not visualized. No aortic stenosis is present.  5. The inferior vena cava is normal in size with greater than 50% respiratory variability, suggesting right atrial pressure of 3 mmHg. FINDINGS  Left Ventricle: Left ventricular ejection fraction, by estimation, is 50 to 55%. The left ventricle has low normal function. Left ventricular endocardial border not optimally defined to evaluate regional wall motion. The left ventricular internal cavity  size was normal in size. There is no left ventricular hypertrophy. Left ventricular diastolic parameters are indeterminate. Right Ventricle: The right ventricular size is normal. No increase in right ventricular wall thickness. Right ventricular systolic function is normal. Tricuspid regurgitation signal is inadequate for assessing PA pressure. Left Atrium: Left atrial size was normal in size. Right Atrium: Right atrial size was normal in size. Pericardium: There is no evidence of pericardial effusion. Mitral Valve: The mitral valve is normal in structure. No evidence of mitral valve regurgitation. No evidence of mitral valve stenosis. MV peak gradient, 4.5 mmHg. The mean mitral valve gradient is 2.0 mmHg. Tricuspid Valve: The tricuspid valve is normal in structure. Tricuspid valve regurgitation is not demonstrated. No evidence of tricuspid stenosis. Aortic Valve: The aortic  valve is normal in structure. Aortic valve regurgitation is not visualized. No aortic stenosis is present. Aortic valve mean gradient measures 4.0 mmHg. Aortic valve peak gradient measures 6.8 mmHg. Pulmonic Valve: The pulmonic valve was not well visualized. Pulmonic valve regurgitation is not visualized. No evidence of pulmonic stenosis. Aorta: The aortic root is normal in size and structure. Venous: The inferior vena cava is normal in size with greater than 50% respiratory variability, suggesting right atrial pressure of 3 mmHg. IAS/Shunts: No atrial level shunt detected by color flow Doppler.   LV Volumes (MOD) LV vol d, MOD A4C: 99.4 ml Diastology LV vol s, MOD A4C: 58.3 ml LV e' lateral:   10.30 cm/s LV SV MOD A4C:     99.4 ml LV E/e' lateral: 5.8  LEFT ATRIUM           Index LA Vol (A4C): 41.5 ml 20.86 ml/m  AORTIC VALVE AV Vmax:           130.00 cm/s AV Vmean:          93.400 cm/s AV VTI:            0.243 m AV Peak Grad:      6.8 mmHg AV Mean Grad:      4.0 mmHg LVOT Vmax:         92.70 cm/s LVOT Vmean:        60.500 cm/s  LVOT VTI:          0.151 m LVOT/AV VTI ratio: 0.62 MITRAL VALVE MV Area (PHT): 6.96 cm    SHUNTS MV Peak grad:  4.5 mmHg    Systemic VTI: 0.15 m MV Mean grad:  2.0 mmHg MV Vmax:       1.06 m/s MV Vmean:      67.9 cm/s MV Decel Time: 109 msec MV E velocity: 59.60 cm/s MV A velocity: 68.60 cm/s MV E/A ratio:  0.87 Kathlyn Sacramento MD Electronically signed by Kathlyn Sacramento MD Signature Date/Time: 10/10/2020/2:00:06 PM    Final     PERFORMANCE STATUS (ECOG) : 3 - Symptomatic, >50% confined to bed  Review of Systems Unless otherwise noted, a complete review of systems is negative.  Physical Exam General: NAD Pulmonary: Unlabored Extremities: no edema, no joint deformities Skin: no rashes Neurological: Weakness but otherwise nonfocal  IMPRESSION: Follow-up visit.  Patient is status post first dose of Adriamycin.  Today, she denies any significant symptomatic concerns.  She is  currently eating her breakfast and denied nausea or vomiting.  She remains on O2 with exertional dyspnea.  OT recommends home health.  PT evaluation pending.    Disposition likely home health.  Will continue to follow her in the palliative care clinic.  PLAN: -Continue current scope of treatment -Will plan to follow patient in the clinic after discharge from the hospital   Time Total: 15 minutes  Visit consisted of counseling and education dealing with the complex and emotionally intense issues of symptom management and palliative care in the setting of serious and potentially life-threatening illness.Greater than 50%  of this time was spent counseling and coordinating care related to the above assessment and plan.  Signed by: Altha Harm, PhD, NP-C

## 2020-10-18 NOTE — Consult Note (Signed)
PHARMACY CONSULT NOTE  Pharmacy Consult for Electrolyte Monitoring and Replacement   Recent Labs: Potassium (mmol/L)  Date Value  10/18/2020 4.5   Magnesium (mg/dL)  Date Value  10/18/2020 2.6 (H)   Calcium (mg/dL)  Date Value  10/18/2020 8.3 (L)   Albumin (g/dL)  Date Value  10/15/2020 2.6 (L)   Phosphorus (mg/dL)  Date Value  10/18/2020 2.8   Sodium (mmol/L)  Date Value  10/18/2020 142   Assessment: Patient is a 55 y/o F with medical history including metastatic breast cancer on chemotherapy, tachycardia, anemia, GERD, malignant pleural effusion, hemothorax who was admitted 2/25 with respiratory failure secondary to pneumonia versus worsening metastatic disease. Pharmacy consulted to assist with electrolyte monitoring and replacement as indicated.   Per chart review, appetite appears poor.   Diuresis: IV Lasix   Goal of Therapy:  Electrolytes within normal limits  Plan:  3/8  Na 142, K 4.5 Mag 2.6  Phos 2.8  Scr 0.36  - currently on lasix 40 IV BID, Mag PO 64mg  daily, Calcium daily,  KCL 20 meq PO daily -no replacement at this time.  --Will follow-up electrolytes with AM labs. f/u Mag/phos every other day as has been stable  Viera Okonski A 10/18/2020 9:10 AM

## 2020-10-18 NOTE — Progress Notes (Signed)
PT Cancellation Note  Patient Details Name: Jessica Willis MRN: [id number] DOB: 11/25/1965   Cancelled Treatment:    Reason Eval/Treat Not Completed:  (Evaluation re-attempted.  Patient with nurse at bedside, preparing for administration of enema.  Will continue efforts at a later time/date as medically appropriate and available.)  Kristen H. Owens Shark, PT, DPT, NCS 10/18/20, 11:02 AM [phone]

## 2020-10-18 NOTE — Progress Notes (Signed)
PROGRESS NOTE  Jessica Willis RKY:[id number] DOB: [birth year] DOA: 10/07/2020 PCP: [place]  Brief History   55 year old woman PMH stage IV metastatic breast cancer on chemotherapy, malignant pleural effusion, hemothorax presented with shortness of breath.  Admitted for acute hypoxic respiratory failure, sepsis thought secondary to pneumonia complicated by metastatic breast cancer. Seen by pulmonology, ID, oncology and PMT.  Treated with antibiotics.  Plan is to continue aggressive treatment, started chemotherapy 3/4.  Major issue is acute hypoxic respiratory failure requiring 5-6 L nasal cannula.  Eventually home with home health once oxygen requirement stable.  Followed by pulmonology.  Weaning steroids.  A & P  Acute hypoxic respiratory failure secondary to pneumonia versus metastatic disease/lymphangitic spread of cancer progression.  Sepsis considered on admission.  Extensive airspace abnormalities of the right lung and loculated pleural effusion, infectious versus malignant. --Wean steroids as per pulmonology.  Continue antibiotics, Lasix, albumin as per pulmonology.  Stage IV metastatic fungating breast cancer with large necrotizing mass, high tumor burden, progressed on weekly Taxol last dose 09/15/2020 --Started chemotherapy 3/4.  Follow-up with oncology as an outpatient.  Fungating right breast mass with drainage --Continue wound care, goal directed towards observing drainage and decreasing discomfort.  Iron deficiency anemia  --Stable.  Chronic tachycardia --Continue beta-blocker  Goals of care --disease not curable, refractory to first-line chemotherapy, aggressive in nature.  Palliative care following.  Guarded prognosis.  Disposition Plan:  Discussion: Fluctuating between 5 and 6 L nasal cannula requirement.  Respiratory status appears to be stabilizing.  Appreciate pulmonology.  Likely home in a few days on home oxygen, outpatient palliative medicine  to follow, home health.  Status is: Inpatient  Remains inpatient appropriate because:IV treatments appropriate due to intensity of illness or inability to take PO and Inpatient level of care appropriate due to severity of illness   Dispo: The patient is from: Home              Anticipated d/c is to: Home with [facility]              Patient currently is not medically stable to d/c.   Difficult to place patient No  DVT prophylaxis: enoxaparin (LOVENOX) injection 40 mg Start: 10/07/20 2200   Code Status: DNR Level of care: Med-Surg Family Communication: none  Murray Hodgkins, MD  [facility] Direct contact: see [email or web address] (further directions at bottom of note if needed) 7PM-7AM contact night coverage as at bottom of note 10/18/2020, 4:25 PM  LOS: 11 days   Significant Hospital Events   .    Consults:  . Oncology . PMT . pulmonology   Procedures:  .   Significant Diagnostic Tests:  Marland Kitchen    Micro Data:  .    Antimicrobials:  .   Interval History/Subjective  CC: f/u SOB  Feels ok, breathing ok. Eating ok. Had a BM earlier.  Objective   Vitals:  Vitals:   10/18/20 0842 10/18/20 1525  BP: 117/74 99/68  Pulse: (!) 108 (!) 110  Resp: 18 18  Temp: 97.9 F (36.6 C) 98.4 F (36.9 C)  SpO2: 98% 98%    Exam:  Constitutional:   . Appears calm and comfortable ENMT:  . grossly normal hearing  Respiratory:  . CTA on the left, coarse BS on the right . Respiratory effort normal.  Cardiovascular:  . RRR, no m/r/g . No LE extremity edema   Psychiatric:  . Mental status o Mood, affect appropriate  I have personally reviewed the following:  Today's Data  . BMP remains stable . Mg and phos unremarkable . Hgb stable at 9.1 . WBC 20.4 stable  Scheduled Meds: . amoxicillin-clavulanate  1 tablet Oral Q12H  . calcium carbonate  1,250 mg Oral Q breakfast  . Chlorhexidine Gluconate Cloth  6 each Topical Daily  . senna  2 tablet Oral BID   And  . docusate sodium   100 mg Oral BID  . dronabinol  5 mg Oral BID AC  . enoxaparin (LOVENOX) injection  40 mg Subcutaneous Q24H  . feeding supplement  237 mL Oral TID BM  . ferrous sulfate  325 mg Oral BID WC  . furosemide  40 mg Intravenous BID  . magnesium chloride  1 tablet Oral Daily  . melatonin  5 mg Oral QHS  . methylPREDNISolone (SOLU-MEDROL) injection  20 mg Intravenous Q12H  . metoprolol tartrate  50 mg Oral BID  . multivitamin with minerals  1 tablet Oral Daily  . polyethylene glycol  17 g Oral BID  . potassium chloride  20 mEq Oral Daily  . scopolamine  1 patch Transdermal Q72H  . sodium chloride flush  10-40 mL Intracatheter Q12H   Continuous Infusions: . albumin human 12.5 g (10/18/20 1030)    Principal Problem:   HCAP (healthcare-associated pneumonia) Active Problems:   Sepsis ([place])   Acute respiratory failure with hypoxia (H[facility])   Metastatic breast cancer (HCC)   Tachycardia   Iron deficiency anemia   Chemotherapy-induced nausea   Poor appetite   LOS: 11 days   How to contact the [facility] Attending or Consulting provider [place] or covering provider during after hours [place], for this patient?  1. Check the care team in [facility] and look for a) attending/consulting [facility] provider listed and b) the [facility] team listed 2. Log into [email or web address] and use [place]'s universal password to access. If you do not have the password, please contact the hospital operator. 3. Locate the [facility] provider you are looking for under [facility] and page to a number that you can be directly reached. 4. If you still have difficulty reaching the provider, please page the [facility] (Director on Call) for the Hospitalists listed on amion for assistance.

## 2020-10-18 NOTE — Progress Notes (Signed)
Hematology/Oncology Progress Note [facility] Telephone:([phone] [phone]  Patient Care Team: [place] as PCP - General Rico Junker, RN as Registered Nurse Benjamine Sprague, DO as Consulting Physician (Surgery)   Name of the patient: Jessica Willis  [id number]  Dec 14, 1965  Date of visit: 10/18/20   INTERVAL HISTORY-  Nausea is better after started on scopolamine patch.  Appetite slightly better.  Started on Marinol She feels tired.  Shortness of breath, unchanged.   Review of systems- Review of Systems  Constitutional: Positive for appetite change, fatigue and unexpected weight change. Negative for chills and fever.  HENT:   Negative for hearing loss and voice change.   Eyes: Negative for eye problems.  Respiratory: Positive for shortness of breath. Negative for chest tightness and cough.   Cardiovascular: Negative for chest pain.  Gastrointestinal: Negative for abdominal distention, abdominal pain, blood in stool and nausea.  Endocrine: Negative for hot flashes.  Genitourinary: Negative for difficulty urinating and frequency.   Musculoskeletal: Negative for arthralgias.  Skin: Negative for itching and rash.  Neurological: Negative for extremity weakness.  Hematological: Negative for adenopathy.  Psychiatric/Behavioral: Negative for confusion.    No Known Allergies  Patient Active Problem List   Diagnosis Date Noted  . Chemotherapy-induced nausea   . Poor appetite   . HCAP (healthcare-associated pneumonia) 10/07/2020  . QT prolongation 09/22/2020  . Pneumonia due to Streptococcus pyogenes ([place]) 08/22/2020  . Genetic testing 07/27/2020  . Pancreatic lesion 07/22/2020  . Swelling of lower leg 07/13/2020  . Encounter for antineoplastic chemotherapy 07/04/2020  . Complication of chemotherapy 07/04/2020  . Family history of breast cancer   . Port-A-Cath in place   . Bone metastasis ([place])   . Hypocalcemia    . Folate deficiency   . Gastroesophageal reflux disease without esophagitis   . Weakness of right arm   . Palliative care encounter   . Iron deficiency anemia   . Anemia, chronic disease   . Tachycardia   . Metastatic breast cancer ([place])   . Malignant pleural effusion   . Chest tube in place   . Goals of care, counseling/discussion   . Hemothorax on right 06/16/2020  . Acute respiratory failure with hypoxia ([place])   . Breast mass, right 06/15/2020  . Mastitis 06/15/2020  . Pleural effusion 06/15/2020  . Intestinal obstruction ([employer])   . Leukocytosis   . Hypokalemia 06/24/2015  . Sepsis ([place]) 06/23/2015  . CAP (community acquired pneumonia) 06/23/2015  . Partial small bowel obstruction ([place]) 06/23/2015     Past Medical History:  Diagnosis Date  . Anemia   . Family history of breast cancer   . Patient denies medical problems      Past Surgical History:  Procedure Laterality Date  . BREAST BIOPSY  06/28/2020   Procedure: BREAST BIOPSY;  Surgeon: Benjamine Sprague, DO;  Location: [facility] ORS;  Service: General;;  . PORTACATH PLACEMENT N/A 06/28/2020   Procedure: INSERTION PORT-A-CATH;  Surgeon: Benjamine Sprague, DO;  Location: [facility] ORS;  Service: General;  Laterality: N/A;  . TUBAL LIGATION    . VIDEO ASSISTED THORACOSCOPY (VATS)/THOROCOTOMY Right 06/23/2020   Procedure: ATTEMPTED VIDEO ASSISTED THORACOSCOPY (VATS);  Surgeon: Nestor Lewandowsky, MD;  Location: [facility] ORS;  Service: General;  Laterality: Right;    Social History   Socioeconomic History  . Marital status: Single    Spouse name: Not on file  . Number of children: Not on file  . Years of education: Not on file  .  Highest education level: Not on file  Occupational History  . Not on file  Tobacco Use  . Smoking status: Never Smoker  . Smokeless tobacco: Never Used  Vaping Use  . Vaping Use: Never used  Substance and Sexual Activity  . Alcohol use: Not Currently    Alcohol/week: 1.0 standard drink    Types: 1 Cans of  beer per week  . Drug use: No  . Sexual activity: Not on file  Other Topics Concern  . Not on file  Social History Narrative  . Not on file   Social Determinants of Health   Financial Resource Strain: Not on file  Food Insecurity: Not on file  Transportation Needs: Not on file  Physical Activity: Not on file  Stress: Not on file  Social Connections: Not on file  Intimate Partner Violence: Not on file     Family History  Problem Relation Age of Onset  . Diabetes Other   . Hypertension Other   . Diabetes Maternal Aunt   . Breast cancer Cousin        dx 20s  . Breast cancer Cousin        dx 74s     Current Facility-Administered Medications:  .  acetaminophen (TYLENOL) tablet 650 mg, 650 mg, Oral, Q6H PRN, Ivor Costa, MD, 650 mg at 10/09/20 2144 .  albumin human 25 % solution 12.5 g, 12.5 g, Intravenous, Daily, Aleskerov, Fuad, MD, Last Rate: 60 mL/hr at 10/18/20 1030, 12.5 g at 10/18/20 1030 .  albuterol (PROVENTIL) (2.5 MG/3ML) 0.083% nebulizer solution 2.5 mg, 2.5 mg, Nebulization, Q6H PRN, Samuella Cota, MD, 2.5 mg at 10/16/20 1612 .  albuterol (VENTOLIN HFA) 108 (90 Base) MCG/ACT inhaler 2 puff, 2 puff, Inhalation, Q4H PRN, Ivor Costa, MD .  amoxicillin-clavulanate (AUGMENTIN) 875-125 MG per tablet 1 tablet, 1 tablet, Oral, Q12H, Ottie Glazier, MD, 1 tablet at 10/18/20 1026 .  bisacodyl (DULCOLAX) suppository 10 mg, 10 mg, Rectal, Daily PRN, Samuella Cota, MD .  calcium carbonate (OS-CAL - dosed in mg of elemental calcium) tablet 1,250 mg, 1,250 mg, Oral, Q breakfast, Ivor Costa, MD, 1,250 mg at 10/18/20 1026 .  Chlorhexidine Gluconate Cloth 2 % PADS 6 each, 6 each, Topical, Daily, Sharen Hones, MD, 6 each at 10/18/20 1527 .  dextromethorphan-guaiFENesin (MUCINEX DM) 30-600 MG per 12 hr tablet 1 tablet, 1 tablet, Oral, BID PRN, Ivor Costa, MD .  senna (SENOKOT) tablet 17.2 mg, 2 tablet, Oral, BID, 17.2 mg at 10/18/20 1026 **AND** docusate sodium (COLACE) capsule  100 mg, 100 mg, Oral, BID, Samuella Cota, MD, 100 mg at 10/18/20 1026 .  dronabinol (MARINOL) capsule 5 mg, 5 mg, Oral, BID AC, Earlie Server, MD, 5 mg at 10/18/20 1524 .  enoxaparin (LOVENOX) injection 40 mg, 40 mg, Subcutaneous, Q24H, Ivor Costa, MD, 40 mg at 10/17/20 2223 .  feeding supplement (ENSURE ENLIVE / ENSURE PLUS) liquid 237 mL, 237 mL, Oral, TID BM, Sharen Hones, MD, 237 mL at 10/18/20 1023 .  ferrous sulfate tablet 325 mg, 325 mg, Oral, BID WC, Ivor Costa, MD, 325 mg at 10/18/20 1026 .  furosemide (LASIX) injection 40 mg, 40 mg, Intravenous, BID, Lanney Gins, Fuad, MD, 40 mg at 10/18/20 1027 .  lidocaine-prilocaine (EMLA) cream 1 application, 1 application, Topical, PRN, Ivor Costa, MD .  magnesium chloride (SLOW-MAG) 64 MG SR tablet 64 mg, 1 tablet, Oral, Daily, Ivor Costa, MD, 64 mg at 10/18/20 1026 .  melatonin tablet 5 mg, 5 mg, Oral, QHS,  Sharen Hones, MD, 5 mg at 10/17/20 2223 .  methylPREDNISolone sodium succinate (SOLU-MEDROL) 40 mg/mL injection 20 mg, 20 mg, Intravenous, Q12H, Lanney Gins, Fuad, MD, 20 mg at 10/18/20 1525 .  metoprolol tartrate (LOPRESSOR) tablet 50 mg, 50 mg, Oral, BID, Ivor Costa, MD, 50 mg at 10/18/20 1025 .  multivitamin with minerals tablet 1 tablet, 1 tablet, Oral, Daily, Samuella Cota, MD, 1 tablet at 10/18/20 1026 .  ondansetron (ZOFRAN) injection 4 mg, 4 mg, Intravenous, Q8H PRN, Ivor Costa, MD, 4 mg at 10/17/20 1309 .  polyethylene glycol (MIRALAX / GLYCOLAX) packet 17 g, 17 g, Oral, BID, Samuella Cota, MD, 17 g at 10/18/20 1024 .  potassium chloride SA (KLOR-CON) CR tablet 20 mEq, 20 mEq, Oral, Daily, Earlie Server, MD, 20 mEq at 10/18/20 1039 .  scopolamine (TRANSDERM-SCOP) 1 MG/3DAYS 1.5 mg, 1 patch, Transdermal, Q72H, Earlie Server, MD, 1.5 mg at 10/17/20 1437 .  simethicone (MYLICON) chewable tablet 80 mg, 80 mg, Oral, Daily PRN, Earlie Server, MD, 80 mg at 10/15/20 1002 .  sodium chloride (OCEAN) 0.65 % nasal spray 1 spray, 1 spray, Each Nare, PRN,  Samuella Cota, MD .  sodium chloride flush (NS) 0.9 % injection 10-40 mL, 10-40 mL, Intracatheter, Q12H, Sharen Hones, MD, 10 mL at 10/18/20 1039 .  sodium chloride flush (NS) 0.9 % injection 10-40 mL, 10-40 mL, Intracatheter, PRN, Sharen Hones, MD, 10 mL at 10/10/20 1447   Physical exam:  Vitals:   10/18/20 0610 10/18/20 0842 10/18/20 1525 10/18/20 1624  BP: 115/74 117/74 99/68 101/63  Pulse: 93 (!) 108 (!) 110 (!) 102  Resp: 16 18 18    Temp: 97.7 F (36.5 C) 97.9 F (36.6 C) 98.4 F (36.9 C)   TempSrc:  Oral    SpO2: 100% 98% 98% 96%  Weight:      Height:       Physical Exam Constitutional:      General: She is not in acute distress.    Appearance: She is not diaphoretic.  HENT:     Head: Normocephalic and atraumatic.     Nose: Nose normal.     Mouth/Throat:     Pharynx: No oropharyngeal exudate.  Eyes:     General: No scleral icterus.    Pupils: Pupils are equal, round, and reactive to light.  Cardiovascular:     Rate and Rhythm: Normal rate and regular rhythm.     Heart sounds: No murmur heard.   Pulmonary:     Effort: Pulmonary effort is normal. No respiratory distress.     Comments: Decreased right lower base breath sound Rhonchi Abdominal:     General: There is no distension.     Palpations: Abdomen is soft.     Tenderness: There is no abdominal tenderness.  Musculoskeletal:        General: Normal range of motion.     Cervical back: Normal range of motion and neck supple.  Skin:    General: Skin is warm and dry.     Findings: No erythema.  Neurological:     Mental Status: She is alert and oriented to person, place, and time.     Cranial Nerves: No cranial nerve deficit.     Motor: No abnormal muscle tone.     Coordination: Coordination normal.  Psychiatric:        Mood and Affect: Affect normal.       CMP Latest Ref Rng & Units 10/18/2020  Glucose 70 - 99 mg/dL 94  BUN 6 - 20 mg/dL 23(H)  Creatinine 0.44 - 1.00 mg/dL 0.45  Sodium 135 - 145  mmol/L 142  Potassium 3.5 - 5.1 mmol/L 4.5  Chloride 98 - 111 mmol/L 97(L)  CO2 22 - 32 mmol/L 35(H)  Calcium 8.9 - 10.3 mg/dL 8.3(L)  Total Protein 6.5 - 8.1 g/dL -  Total Bilirubin 0.3 - 1.2 mg/dL -  Alkaline Phos 38 - 126 U/L -  AST 15 - 41 U/L -  ALT 0 - 44 U/L -   CBC Latest Ref Rng & Units 10/18/2020  WBC 4.0 - 10.5 K/uL 20.4(H)  Hemoglobin 12.0 - 15.0 g/dL 9.1(L)  Hematocrit 36.0 - 46.0 % 30.9(L)  Platelets 150 - 400 K/uL 351    RADIOGRAPHIC STUDIES: I have personally reviewed the radiological images as listed and agreed with the findings in the report. DG Chest 2 View  Result Date: 10/07/2020 CLINICAL DATA:  Hypoxia, shortness of breath, metastatic breast cancer EXAM: CHEST - 2 VIEW COMPARISON:  09/13/2020 FINDINGS: LEFT subclavian Port-A-Cath with tip projecting over RIGHT atrium. Enlargement of cardiac silhouette. Pulmonary vascular congestion. Persistent enlargement of RIGHT hilum question mass or adenopathy. Increased infiltrate of the mid to lower RIGHT lung. Small RIGHT pleural effusion again seen. Accentuation of markings in LEFT perihilar region little changed. No pneumothorax or acute osseous findings. IMPRESSION: Increased RIGHT perihilar infiltrate with persistent small RIGHT pleural effusion. Enlargement of RIGHT pulmonary hilum by adenopathy versus perihilar mass. Electronically Signed   By: Lavonia Dana M.D.   On: 10/07/2020 11:32   CT Angio Chest PE W and/or Wo Contrast  Result Date: 10/07/2020 CLINICAL DATA:  Breast carcinoma. Tachycardia and shortness of breath EXAM: CT ANGIOGRAPHY CHEST WITH CONTRAST TECHNIQUE: Multidetector CT imaging of the chest was performed using the standard protocol during bolus administration of intravenous contrast. Multiplanar CT image reconstructions and MIPs were obtained to evaluate the vascular anatomy. CONTRAST:  22mL OMNIPAQUE IOHEXOL 350 MG/ML SOLN COMPARISON:  CT angiogram chest June 15, 2020; chest radiograph October 07, 2020  FINDINGS: Cardiovascular: There is no evident pulmonary embolus. No appreciable thoracic aortic aneurysm or dissection. Visualized great vessels appear unremarkable. There is no appreciable pericardial effusion or pericardial thickening. Port-A-Cath tip is in the superior vena cava near the cavoatrial junction. Mediastinum/Nodes: Thyroid appears normal. There are multiple axillary lymph nodes bilaterally, more severe on the right than on the left. The largest lymph node is seen in the right axillary region measuring 2.7 x 2.1 cm. There is adenopathy in the right supraclavicular and infraclavicular regions with extension of adenopathy into the right Peri carinal region. Largest individual lymph node in these areas measures 2.6 x 1.8 cm. Several left supraclavicular lymph nodes are also noted, largest measuring 1.7 x 1.3 cm. There are several subcentimeter mediastinal lymph nodes. There is extensive retrocrural adenopathy. The largest lymph node or collection of matted lymph nodes is seen inferior to the aorta on the left posteriorly measuring 3.4 x 2.1 cm. No esophageal lesions are evident. Lungs/Pleura: There is extensive airspace consolidation in portions of the right middle and lower lobes with interspersed areas of loculated pleural effusion. There is ill-defined airspace opacity consistent with pneumonia in the left upper lobe. Loculated fluid tracks along the left major fissure. There may well be associated metastasis within the fissure given the extensive nodularity in this area. There is a small left pleural effusion with left base atelectasis. Pleural metastases noted along each upper hemithorax, primarily posteriorly as well as in the right apex. Upper Abdomen:  Retrocrural adenopathy extends into the upper abdominal region. Visualized upper abdominal structures otherwise appear unremarkable. Musculoskeletal: There is a large mass occupying the right breast and chest wall region with invasion of the deep  muscles of the chest wall on the right. There is also invasion of the right axilla. There is extensive subcutaneous thickening in both breast regions. There is a mass in the lateral left breast with extension of soft tissue prominence along the lateral left hemithorax, likely due to extension of neoplasm into the adjacent soft tissues. There is widespread sclerotic bony metastatic disease throughout the thoracic region. Review of the MIP images confirms the above findings. IMPRESSION: 1. No demonstrable pulmonary embolus. No thoracic aortic aneurysm or dissection. 2. Areas of airspace consolidation noted in the right middle and lower lobes. More ill-defined infiltrate is noted in the left upper lobe anteriorly. Areas of loculated fluid noted in the left major fissure. Nodularity in this area could indicate superimposed masses in left major fissure. There is pleural thickening along both posterior upper hemithorax regions, likely pleural metastases. Loculated fluid noted in areas of infiltrate on the right. Small left pleural effusion noted. 3. Adenopathy at multiple sites, most severe in the retrocrural regions bilaterally, right supraclavicular region, and right axillary region, although there is left axillary and left supraclavicular adenopathy. 4. Large mass lesions occupying the right breast with extension and invasion of the chest wall and right axillary regions. A lesser degree of metastatic disease to the left of midline in the subcutaneous tissues and lateral left breast/lateral left chest wall soft tissues noted. 5. Multifocal sclerotic bony metastatic disease throughout the thoracic region noted. Electronically Signed   By: Lowella Grip III M.D.   On: 10/07/2020 13:23   US Venous Img Upper Uni Right(DVT)  Result Date: 10/08/2020 CLINICAL DATA:  History of stage IV breast cancer now with right upper extremity pain and edema. Evaluate for DVT. EXAM: RIGHT UPPER EXTREMITY VENOUS DOPPLER ULTRASOUND  TECHNIQUE: Gray-scale sonography with graded compression, as well as color Doppler and duplex ultrasound were performed to evaluate the upper extremity deep venous system from the level of the subclavian vein and including the jugular, axillary, basilic, radial, ulnar and upper cephalic vein. Spectral Doppler was utilized to evaluate flow at rest and with distal augmentation maneuvers. COMPARISON:  None. FINDINGS: Contralateral Subclavian Vein: Respiratory phasicity is normal and symmetric with the symptomatic side. No evidence of thrombus. Normal compressibility. Internal Jugular Vein: No evidence of thrombus. Normal compressibility, respiratory phasicity and response to augmentation. Subclavian Vein: No evidence of thrombus. Normal compressibility, respiratory phasicity and response to augmentation. Axillary Vein: Appears patent where imaged though evaluation degraded secondary to patient's discomfort with sonographic evaluation of the axilla. Cephalic Vein: No evidence of thrombus. Normal compressibility, respiratory phasicity and response to augmentation. Basilic Vein: No evidence of thrombus. Normal compressibility, respiratory phasicity and response to augmentation. Brachial Veins: No evidence of thrombus within either of the paired brachial veins. Normal compressibility, respiratory phasicity and response to augmentation. Radial Veins: No evidence of thrombus. Normal compressibility, respiratory phasicity and response to augmentation. Ulnar Veins: No evidence of thrombus. Normal compressibility, respiratory phasicity and response to augmentation. Venous Reflux:  None visualized. Other Findings: There is a moderate amount of subcutaneous edema at the level of the right upper arm. IMPRESSION: No evidence of DVT within the right upper extremity. Electronically Signed   By: Sandi Mariscal M.D.   On: 10/08/2020 14:29   DG Chest Port 1 View  Result Date: 10/15/2020 CLINICAL  DATA:  Pneumonia and pleural effusion.  Metastatic breast cancer. EXAM: PORTABLE CHEST 1 VIEW COMPARISON:  10/13/2020 chest radiograph and prior studies FINDINGS: Cardiomediastinal silhouette is unchanged with RIGHT mediastinal fullness. A LEFT subclavian Port-A-Cath with tip overlying the UPPER RIGHT atrium again noted. Scattered opacities within both lungs are again noted with a RIGHT pleural effusion. There is no evidence of pneumothorax. No significant changes are identified. IMPRESSION: Unchanged appearance of the chest with scattered opacities within both lungs and RIGHT pleural effusion. Electronically Signed   By: Margarette Canada M.D.   On: 10/15/2020 11:06   DG Chest Port 1 View  Result Date: 10/13/2020 CLINICAL DATA:  Pleural effusion EXAM: PORTABLE CHEST 1 VIEW COMPARISON:  10/07/2020 FINDINGS: Unchanged positioning of left-sided Port-A-Cath. Stable cardiomediastinal contours. Persistent small right pleural effusion. Slight progression of airspace opacity throughout the right lung most pronounced within the right lung base. Similar interstitial opacities throughout the left lung. No pneumothorax. IMPRESSION: 1. Slight progression of airspace opacity throughout the right lung, most pronounced within the right lung base. 2. Persistent small right pleural effusion. Electronically Signed   By: Davina Poke D.O.   On: 10/13/2020 14:10   ECHOCARDIOGRAM COMPLETE  Result Date: 10/10/2020    ECHOCARDIOGRAM REPORT   Patient Name:   ILYANA MANUELE Date of Exam: 10/10/2020 Medical Rec #:  [id number]           Height:       70.0 in Accession #:    [id number]          Weight:       178.7 lb Date of Birth:  1965/10/06           BSA:          1.990 m Patient Age:    55 years            BP:           119/79 mmHg Patient Gender: F                   HR:           105 bpm. Exam Location:  [facility] Procedure: 2D Echo, Limited Color Doppler and Cardiac Doppler Indications:     R06.03 Acute Respiratory Distress  History:         Patient has no prior history of  Echocardiogram examinations.                  Signs/Symptoms:Shortness of Breath. Breast cancer.  Sonographer:     Charmayne Sheer RDCS (AE) Referring Phys:  [id number] Sharen Hones Diagnosing Phys: Kathlyn Sacramento MD  Sonographer Comments: Technically challenging study due to limited acoustic windows. IMPRESSIONS  1. Left ventricular ejection fraction, by estimation, is 50 to 55%. The left ventricle has low normal function. Left ventricular endocardial border not optimally defined to evaluate regional wall motion. Left ventricular diastolic parameters are indeterminate.  2. Right ventricular systolic function is normal. The right ventricular size is normal. Tricuspid regurgitation signal is inadequate for assessing PA pressure.  3. The mitral valve is normal in structure. No evidence of mitral valve regurgitation. No evidence of mitral stenosis.  4. The aortic valve is normal in structure. Aortic valve regurgitation is not visualized. No aortic stenosis is present.  5. The inferior vena cava is normal in size with greater than 50% respiratory variability, suggesting right atrial pressure of 3 mmHg. FINDINGS  Left Ventricle: Left ventricular ejection fraction, by estimation, is 50 to 55%. The left  ventricle has low normal function. Left ventricular endocardial border not optimally defined to evaluate regional wall motion. The left ventricular internal cavity  size was normal in size. There is no left ventricular hypertrophy. Left ventricular diastolic parameters are indeterminate. Right Ventricle: The right ventricular size is normal. No increase in right ventricular wall thickness. Right ventricular systolic function is normal. Tricuspid regurgitation signal is inadequate for assessing PA pressure. Left Atrium: Left atrial size was normal in size. Right Atrium: Right atrial size was normal in size. Pericardium: There is no evidence of pericardial effusion. Mitral Valve: The mitral valve is normal in structure. No evidence of  mitral valve regurgitation. No evidence of mitral valve stenosis. MV peak gradient, 4.5 mmHg. The mean mitral valve gradient is 2.0 mmHg. Tricuspid Valve: The tricuspid valve is normal in structure. Tricuspid valve regurgitation is not demonstrated. No evidence of tricuspid stenosis. Aortic Valve: The aortic valve is normal in structure. Aortic valve regurgitation is not visualized. No aortic stenosis is present. Aortic valve mean gradient measures 4.0 mmHg. Aortic valve peak gradient measures 6.8 mmHg. Pulmonic Valve: The pulmonic valve was not well visualized. Pulmonic valve regurgitation is not visualized. No evidence of pulmonic stenosis. Aorta: The aortic root is normal in size and structure. Venous: The inferior vena cava is normal in size with greater than 50% respiratory variability, suggesting right atrial pressure of 3 mmHg. IAS/Shunts: No atrial level shunt detected by color flow Doppler.   LV Volumes (MOD) LV vol d, MOD A4C: 99.4 ml Diastology LV vol s, MOD A4C: 58.3 ml LV e' lateral:   10.30 cm/s LV SV MOD A4C:     99.4 ml LV E/e' lateral: 5.8  LEFT ATRIUM           Index LA Vol (A4C): 41.5 ml 20.86 ml/m  AORTIC VALVE AV Vmax:           130.00 cm/s AV Vmean:          93.400 cm/s AV VTI:            0.243 m AV Peak Grad:      6.8 mmHg AV Mean Grad:      4.0 mmHg LVOT Vmax:         92.70 cm/s LVOT Vmean:        60.500 cm/s LVOT VTI:          0.151 m LVOT/AV VTI ratio: 0.62 MITRAL VALVE MV Area (PHT): 6.96 cm    SHUNTS MV Peak grad:  4.5 mmHg    Systemic VTI: 0.15 m MV Mean grad:  2.0 mmHg MV Vmax:       1.06 m/s MV Vmean:      67.9 cm/s MV Decel Time: 109 msec MV E velocity: 59.60 cm/s MV A velocity: 68.60 cm/s MV E/A ratio:  0.87 Kathlyn Sacramento MD Electronically signed by Kathlyn Sacramento MD Signature Date/Time: 10/10/2020/2:00:06 PM    Final     Assessment and plan-   # #Acute respiratory failure with hypoxia Continue Augmentin  Prednisone tapering.  And IV Lasix. Pulmonology  following.  #Metastatic triple negative breast cancer, patient has high tumor burden, progressed on first-line palliative chemotherapy with Taxol.  There are other treatment options available, i.e. Adriamycin +/- Cytoxan, Sacituzumab govitecan.  Immunotherapy is less likely beneficial given her CPS <1. Patient is very sick with visceral crisis/obstructive pneumonia.  Status post 1 dose of Adriamycin 60mg /m2, day 5   #History of QTc prolongation, OTc corrected on recent EKG. Keep potassium above  4, and Mag >2. Start her on daily oral potassium 66meq.   #Nausea, limited on Zofran dose.  Symptom has improved after started on scopolamine patch. #Poor appetite, trials of Marinol. #Disposition, hopefully home with oxygen in a few days.  Physical therapy evaluation. Thank you for allowing me to participate in the care of this patient. Code status DNR  #PT evaluation Earlie Server, MD, PhD Hematology Oncology [facility] Pager- [phone] 10/18/2020

## 2020-10-19 DIAGNOSIS — J189 Pneumonia, unspecified organism: Secondary | ICD-10-CM | POA: Diagnosis not present

## 2020-10-19 LAB — BASIC METABOLIC PANEL
Anion gap: 10 (ref 5–15)
BUN: 24 mg/dL — ABNORMAL HIGH (ref 6–20)
CO2: 34 mmol/L — ABNORMAL HIGH (ref 22–32)
Calcium: 8.4 mg/dL — ABNORMAL LOW (ref 8.9–10.3)
Chloride: 98 mmol/L (ref 98–111)
Creatinine, Ser: 0.49 mg/dL (ref 0.44–1.00)
GFR, Estimated: >60 mL/min (ref >60)
Glucose, Bld: 98 mg/dL (ref 70–99)
Potassium: 4.3 mmol/L (ref 3.5–5.1)
Sodium: 142 mmol/L (ref 135–145)

## 2020-10-19 MED ORDER — MAGIC MOUTHWASH W/LIDOCAINE
15.0000 mL | Freq: Three times a day (TID) | ORAL | Status: DC
  Administered 2020-10-19 – 2020-10-21 (×5): 15 mL via ORAL
  Filled 2020-10-19 (×8): qty 15

## 2020-10-19 MED ORDER — FUROSEMIDE 10 MG/ML IJ SOLN
40.0000 mg | Freq: Every day | INTRAMUSCULAR | Status: DC
  Administered 2020-10-20 – 2020-10-21 (×2): 40 mg via INTRAVENOUS
  Filled 2020-10-19 (×2): qty 4

## 2020-10-19 MED ORDER — PREDNISONE 50 MG PO TABS
50.0000 mg | ORAL_TABLET | Freq: Every day | ORAL | Status: DC
  Administered 2020-10-20 – 2020-10-21 (×2): 50 mg via ORAL
  Filled 2020-10-19 (×2): qty 1

## 2020-10-19 MED ORDER — ENSURE ENLIVE PO LIQD
237.0000 mL | Freq: Four times a day (QID) | ORAL | Status: DC
  Administered 2020-10-19 – 2020-10-21 (×3): 237 mL via ORAL

## 2020-10-19 NOTE — Progress Notes (Signed)
Occupational Therapy Treatment Patient Details Name: Rocquel Askren MRN: [id number] DOB: [birth year] Today's Date: 10/19/2020    History of present illness Arella Blinder is a 55 y.o. female with medical history significant of stage IV metastasized breast cancer on chemotherapy, tachycardia, GERD, anemia, malignant pleural effusion, hemothorax, who presented to hosptial ED on 10/07/2020 with shortness of breath. PE ruled out.  Chest CT scan showed pneumonia of the right middle lobe and lower lobe. Admitted for acute hypoxic respiratory failure, sepsis thought secondary to pneumonia complicated by metastatic breast cancer. Seen by pulmonology, ID, oncology and PMT. Plan is to continue aggressive treatment, started chemotherapy 3/4. Has fungating right breast mass with drainage. Disease not curable, refractory to first-line chemotherapy, aggressive in nature.  Palliative care following.  Guarded prognosis.   OT comments  Ms. Broberg received in bed, requesting assistance with toileting and clean up. Bed and pt soaked in urine and feces, and with pt reporting she was too tired to move, but denying pain. Engaged in bed mobility, transfer to [facility], UB and LB bathing. Pt able to stand X 2 minutes with +2 support, participated minimally in washing front of body, unable to assist at all with peri-hygiene, requiring +3 support to assist pt with standing and cleaning. Pt transferred to recliner with Mod A +2 -- her first time OOB today. After toileting, thorough bathing, grooming, and donning clean clothing, pt stated she felt much better, managed a slight smile and eye contact at that point. Given level of assistance pt required today, continue to recommend 24 hr assistance following return home, as well as HHOT to assist with basic ADL and functional mobility.     Follow Up Recommendations  Home health OT;Supervision/Assistance - 24 hour    Equipment Recommendations       Recommendations for Other  Services      Precautions / Restrictions Precautions Precautions: Fall Restrictions Weight Bearing Restrictions: No       Mobility Bed Mobility Overal bed mobility: Needs Assistance Bed Mobility: Rolling;Supine to Sit Rolling: Min guard   Supine to sit: Mod assist     General bed mobility comments: requires Mod A for LE repositioning    Transfers Overall transfer level: Needs assistance Equipment used: 1 person hand held assist Transfers: Sit to/from [employer] Transfers Sit to Stand: Mod assist Stand pivot transfers: Mod assist            Balance Overall balance assessment: Needs assistance Sitting-balance support: No upper extremity supported;Feet supported Sitting balance-Leahy Scale: Fair     Standing balance support: Single extremity supported Standing balance-Leahy Scale: Poor                             ADL either performed or assessed with clinical judgement   ADL Overall ADL's : Needs assistance/impaired Eating/Feeding: Independent Eating/Feeding Details (indicate cue type and reason): reports can no longer taste her food Grooming: Wash/dry face;Wash/dry hands;Oral care;Min guard   Upper Body Bathing: Maximal assistance   Lower Body Bathing: Maximal assistance   Upper Body Dressing : Minimal assistance       Toilet Transfer: Moderate assistance;+2 for physical assistance   Toileting- Clothing Manipulation and Hygiene: +2 for physical assistance;Maximal assistance       Functional mobility during ADLs: Maximal assistance       Vision Patient Visual Report: No change from baseline     Perception     Praxis      Cognition  Arousal/Alertness: Awake/alert Behavior During Therapy: WFL for tasks assessed/performed;Flat affect Overall Cognitive Status: Within Functional Limits for tasks assessed                                 General Comments: pt avoids eye contact        Exercises Other  Exercises Other Exercises: bed mobility, transfers, toileting, dressing, grooming   Shoulder Instructions       General Comments      Pertinent Vitals/ Pain       Pain Assessment: No/denies pain  Home Living                                          Prior Functioning/Environment              Frequency  Min 1X/week        Progress Toward Goals  OT Goals(current goals can now be found in the care plan section)  Progress towards OT goals: Progressing toward goals  Acute Rehab OT Goals Patient Stated Goal: To return home OT Goal Formulation: With patient Time For Goal Achievement: 10/31/20 Potential to Achieve Goals: Good  Plan Discharge plan remains appropriate;Frequency remains appropriate    Co-evaluation                 AM-PAC OT "6 Clicks" Daily Activity     Outcome Measure   Help from another person eating meals?: None Help from another person taking care of personal grooming?: A Little Help from another person toileting, which includes using toliet, bedpan, or urinal?: A Lot Help from another person bathing (including washing, rinsing, drying)?: A Lot Help from another person to put on and taking off regular upper body clothing?: A Little Help from another person to put on and taking off regular lower body clothing?: A Lot 6 Click Score: 16    End of Session Equipment Utilized During Treatment: Oxygen  OT Visit Diagnosis: Unsteadiness on feet (R26.81);Repeated falls (R29.6);Muscle weakness (generalized) (M62.81)   Activity Tolerance Patient tolerated treatment well   Patient Left in chair;with call bell/phone within reach   Nurse Communication  (needs purewick, needs hand sanitizer, needs urine container emptied)        Time: 7673-4193 OT Time Calculation (min): 69 min  Charges: OT General Charges $OT Visit: 1 Visit OT Treatments $Self Care/Home Management : 68-82 mins  Josiah Lobo, PhD, MS, OTR/L 10/19/20,  4:02 PM

## 2020-10-19 NOTE — Plan of Care (Signed)

## 2020-10-19 NOTE — Progress Notes (Signed)
Nutrition Follow-up  DOCUMENTATION CODES:   Not applicable  INTERVENTION:  Increase Ensure Enlive po QID, each supplement provides 350 kcal and 20 grams of protein  Continue Magic cup TID with meals, each supplement provides 290 kcal and 9 grams of protein  Continue MVI po daily   NUTRITION DIAGNOSIS:   Increased nutrient needs related to cancer and cancer related treatments as evidenced by estimated needs. -ongoing  GOAL:   Patient will meet greater than or equal to 90% of their needs -progressing  MONITOR:   PO intake,Labs,Weight trends,I & O's,Skin  REASON FOR ASSESSMENT:   Malnutrition Screening Tool    ASSESSMENT:   55 year old female admitted with acute respiratory failure with hypoxia and severe sepsis due to HCAP. 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 Pt seen by oncologist in clinic and found to have oxygen desaturated to 87% on room air, tachycardia and reports shortness of breath over the last several days.  Pt refusing to work with PT, continues to feel weak and tired, ongoing poor oral intake of meals. She has eaten 0% of the last 3 documented meals 3/8-3/9. Pt started on appetite stimulant on 3/7. She is drinking Ensure supplements most of the time, accepting 17/20 offered over the last week, will increase to 4 times daily given poor meal intake. She is receiving Magic Cup on meal trays.   Last weight 81.2 kg on 3/6 increased 2 kg from 79.2 kg on 2/26; +5.3 L since admit  UOP: 1000 ml x 24 hrs  Plans to discharge home with home health and oxygen on 3/10, pt advised to get out of bed and ambulate.   Medications reviewed and include: Augmentin, Os-cal, Senokot, Colace, Marinol, Ferrous sulfate, Lasix IV 40 mg twice daily, Methylprednisolone, Melatonin, MVI, Scopolamine, Albumin IV 12.5 grams daily  Labs reviewed  Diet Order:   Diet  Order            Diet regular Room service appropriate? Yes; Fluid consistency: Thin  Diet effective now                 EDUCATION NEEDS:   Education needs have been addressed  Skin:  Skin Assessment: Skin Integrity Issues: Skin Integrity Issues:: Other (Comment) Other: open wound; anterior necrotic tumor; right breast  Last BM:  3/9 type 7 large  Height:   Ht Readings from Last 1 Encounters:  10/07/20 5\' 10"  (1.778 m)    Weight:   Wt Readings from Last 1 Encounters:  10/16/20 81.2 kg    BMI:  Body mass index is 25.68 kg/m.  Estimated Nutritional Needs:   Kcal:  1900-2200kcal/day  Protein:  95-110g/day  Fluid:  2.0-2.3L/day   Lajuan Lines, RD, LDN Clinical Nutrition After Hours/Weekend Pager # in [place]

## 2020-10-19 NOTE — Progress Notes (Signed)
Pulmonary Medicine          Date: 10/19/2020,   MRN# [id number] Jessica Willis [birth year]     AdmissionWeight: 77.6 kg                 CurrentWeight: 81.2 kg   Referring physician: Dr Tasia Catchings   CHIEF COMPLAINT:      HISTORY OF PRESENT ILLNESS   Jessica Willis is a 55 y.o. female with medical history significant of stage IV metastasized breast cancer on chemotherapy, tachycardia, GERD, anemia, malignant pleural effusion, hemothorax, who presents with shortness of breath. In ER initally presentedwith cough and sob. She had massive rt pleural effusion with collapse of rt lung . She also had rt breast mass with changes in skin color for  6 months . She underwent thoracenteiss and it was metastatic adenocarcinoma. MRI of the brain showed scattered metastatic implants upper cervical spine.  She had a port placement and also underwent incisional biopsy of the rt breast mass on 06/28/20.  She got a dose of taxol while inpatient and was discharged on 07/01/20. She followed up with oncologist as OP, on palliative chemotherapy with weekly taxol.  Has been having tachycardia and SOB- saw Cardiology on 09/26/20 Prolonged QT and tachycardia. Pt was found to have WBC 22.3, negative Covid PCR, lactic acid 2.3, INR 1.3, PTT 36, electrolytes renal function okay, temperature 99.5, blood pressure 138/80, heart rate 134, RR 20, oxygen saturation 87% on room air, which improved her to 100% on 2 L oxygen. PCCM consultation placed due to complicated respiratory status with metastatic lung cancer and large malignant effusion. CT chest below shows pleural effusion bilaterally worse on right with metastatic lesions visible on bony spine from thoracic spine down and significant hilar and mediastinal lymphadneopathy suggestive of widespread mets.   10/13/20- patient reports improved breathing, weaned O2 to 4L [place].  She is diuresing well which ishelping her breathing.  She responded well to steroids,  inflammatory markers are elevated likely due to active malignancy and CAP(although most of this is likely fluid and unclear how much is actual infection).  She feels strong enough to get up and participate with PT.  She is using IS and able to take tidal volume 500cc today.  I have encouraged to use every hour. I met with son and reviewed care plan.   10/14/20-  Patient had onc tx today. She felt onset of SOB few hours after coming back however vitals stable.   10/15/20- patient had incresed O2 req this am to 5L/min.  I will repeat CXR today.  She was able to eat this am, states overnight she felt worsening SOB post therapy yesterday. Shes making adequate urine overnight, will keep lasix at 40.  Will dc albumin as this can sometimes cause pulm edema and will keep low dose bid solumedrol 20 for now.   10/16/20-  Patient weaned to 4L/min [place]. Reviewed CXR with improved left side still significant airspace and interstitial opacification on right. She had documented acute episode of resp distress with spO2 74%   10/17/20- patient resting in bed in no distress. She is on 4-5L/mn [place].  Lasix 40 BID with albumin infusing but patient remains with plateau in terms of improvement over past 24-48h.  Continue MEtaneb. PT/OT   10/18/20- despite >2L diuresis overnight patient actually seems to have increased O2 req hower spO2 100% she may be able towean more.  She reports improvement and has no pain or muscle cramping.  She tolerates  BID lasix well with normal renal function today. Plan to continue current scope of therapy from pulm perspective and will consider repeat CT chest for interval changes.    10/19/20- patient is down to 3L/min.  Oral mucosa is dry and there is oral thrush. Will start Diflucan today and magic mouth wash.   PAST MEDICAL HISTORY   Past Medical History:  Diagnosis Date  . Anemia   . Family history of breast cancer   . Patient denies medical problems      SURGICAL HISTORY   Past Surgical  History:  Procedure Laterality Date  . BREAST BIOPSY  06/28/2020   Procedure: BREAST BIOPSY;  Surgeon: Benjamine Sprague, DO;  Location: [facility] ORS;  Service: General;;  . PORTACATH PLACEMENT N/A 06/28/2020   Procedure: INSERTION PORT-A-CATH;  Surgeon: Benjamine Sprague, DO;  Location: [facility] ORS;  Service: General;  Laterality: N/A;  . TUBAL LIGATION    . VIDEO ASSISTED THORACOSCOPY (VATS)/THOROCOTOMY Right 06/23/2020   Procedure: ATTEMPTED VIDEO ASSISTED THORACOSCOPY (VATS);  Surgeon: Nestor Lewandowsky, MD;  Location: [facility] ORS;  Service: General;  Laterality: Right;     FAMILY HISTORY   Family History  Problem Relation Age of Onset  . Diabetes Other   . Hypertension Other   . Diabetes Maternal Aunt   . Breast cancer Cousin        dx 33s  . Breast cancer Cousin        dx 58s     SOCIAL HISTORY   Social History   Tobacco Use  . Smoking status: Never Smoker  . Smokeless tobacco: Never Used  Vaping Use  . Vaping Use: Never used  Substance Use Topics  . Alcohol use: Not Currently    Alcohol/week: 1.0 standard drink    Types: 1 Cans of beer per week  . Drug use: No     MEDICATIONS    Home Medication:    Current Medication:  Current Facility-Administered Medications:  .  acetaminophen (TYLENOL) tablet 650 mg, 650 mg, Oral, Q6H PRN, Ivor Costa, MD, 650 mg at 10/09/20 2144 .  albumin human 25 % solution 12.5 g, 12.5 g, Intravenous, Daily, Lanney Gins, Winnell Bento, MD, Last Rate: 60 mL/hr at 10/19/20 0921, 12.5 g at 10/19/20 0921 .  albuterol (PROVENTIL) (2.5 MG/3ML) 0.083% nebulizer solution 2.5 mg, 2.5 mg, Nebulization, Q6H PRN, Samuella Cota, MD, 2.5 mg at 10/16/20 1612 .  albuterol (VENTOLIN HFA) 108 (90 Base) MCG/ACT inhaler 2 puff, 2 puff, Inhalation, Q4H PRN, Ivor Costa, MD .  amoxicillin-clavulanate (AUGMENTIN) 875-125 MG per tablet 1 tablet, 1 tablet, Oral, Q12H, Ottie Glazier, MD, 1 tablet at 10/19/20 0907 .  bisacodyl (DULCOLAX) suppository 10 mg, 10 mg, Rectal, Daily PRN,  Samuella Cota, MD .  calcium carbonate (OS-CAL - dosed in mg of elemental calcium) tablet 1,250 mg, 1,250 mg, Oral, Q breakfast, Ivor Costa, MD, 1,250 mg at 10/19/20 0907 .  Chlorhexidine Gluconate Cloth 2 % PADS 6 each, 6 each, Topical, Daily, Sharen Hones, MD, 6 each at 10/19/20 [phone] .  dextromethorphan-guaiFENesin (MUCINEX DM) 30-600 MG per 12 hr tablet 1 tablet, 1 tablet, Oral, BID PRN, Ivor Costa, MD, 1 tablet at 10/19/20 0907 .  senna (SENOKOT) tablet 17.2 mg, 2 tablet, Oral, BID, 17.2 mg at 10/18/20 1026 **AND** docusate sodium (COLACE) capsule 100 mg, 100 mg, Oral, BID, Samuella Cota, MD, 100 mg at 10/18/20 1026 .  dronabinol (MARINOL) capsule 5 mg, 5 mg, Oral, BID AC, Earlie Server, MD, 5 mg at 10/19/20 1601 .  enoxaparin (LOVENOX) injection 40 mg, 40 mg, Subcutaneous, Q24H, Ivor Costa, MD, 40 mg at 10/18/20 2234 .  feeding supplement (ENSURE ENLIVE / ENSURE PLUS) liquid 237 mL, 237 mL, Oral, QID, Amin, Sumayya, MD .  ferrous sulfate tablet 325 mg, 325 mg, Oral, BID WC, Ivor Costa, MD, 325 mg at 10/19/20 1600 .  furosemide (LASIX) injection 40 mg, 40 mg, Intravenous, BID, Lanney Gins, Brylinn Teaney, MD, 40 mg at 10/19/20 0907 .  lidocaine-prilocaine (EMLA) cream 1 application, 1 application, Topical, PRN, Ivor Costa, MD .  magnesium chloride (SLOW-MAG) 64 MG SR tablet 64 mg, 1 tablet, Oral, Daily, Ivor Costa, MD, 64 mg at 10/19/20 0907 .  melatonin tablet 5 mg, 5 mg, Oral, QHS, Sharen Hones, MD, 5 mg at 10/18/20 2234 .  methylPREDNISolone sodium succinate (SOLU-MEDROL) 40 mg/mL injection 20 mg, 20 mg, Intravenous, Q12H, Lanney Gins, Whisper Kurka, MD, 20 mg at 10/19/20 1410 .  metoprolol tartrate (LOPRESSOR) tablet 50 mg, 50 mg, Oral, BID, Ivor Costa, MD, 50 mg at 10/19/20 0907 .  multivitamin with minerals tablet 1 tablet, 1 tablet, Oral, Daily, Samuella Cota, MD, 1 tablet at 10/19/20 0906 .  ondansetron (ZOFRAN) injection 4 mg, 4 mg, Intravenous, Q8H PRN, Ivor Costa, MD, 4 mg at 10/17/20 1309 .   polyethylene glycol (MIRALAX / GLYCOLAX) packet 17 g, 17 g, Oral, BID, Samuella Cota, MD, 17 g at 10/18/20 1024 .  potassium chloride SA (KLOR-CON) CR tablet 20 mEq, 20 mEq, Oral, Daily, Earlie Server, MD, 20 mEq at 10/19/20 0973 .  scopolamine (TRANSDERM-SCOP) 1 MG/3DAYS 1.5 mg, 1 patch, Transdermal, Q72H, Earlie Server, MD, 1.5 mg at 10/17/20 1437 .  simethicone (MYLICON) chewable tablet 80 mg, 80 mg, Oral, Daily PRN, Earlie Server, MD, 80 mg at 10/15/20 1002 .  sodium chloride (OCEAN) 0.65 % nasal spray 1 spray, 1 spray, Each Nare, PRN, Samuella Cota, MD .  sodium chloride flush (NS) 0.9 % injection 10-40 mL, 10-40 mL, Intracatheter, Q12H, Sharen Hones, MD, 10 mL at 10/19/20 0928 .  sodium chloride flush (NS) 0.9 % injection 10-40 mL, 10-40 mL, Intracatheter, PRN, Sharen Hones, MD, 10 mL at 10/10/20 1447    ALLERGIES   Patient has no known allergies.     REVIEW OF SYSTEMS    Review of Systems:  Gen:  Denies  fever, sweats, chills weigh loss  HEENT: Denies blurred vision, double vision, ear pain, eye pain, hearing loss, nose bleeds, sore throat Cardiac:  No dizziness, chest pain or heaviness, chest tightness,edema Resp:   Denies cough or sputum porduction, shortness of breath,wheezing, hemoptysis,  Gi: Denies swallowing difficulty, stomach pain, nausea or vomiting, diarrhea, constipation, bowel incontinence Gu:  Denies bladder incontinence, burning urine Ext:   Denies Joint pain, stiffness or swelling Skin: Denies  skin rash, easy bruising or bleeding or hives Endoc:  Denies polyuria, polydipsia , polyphagia or weight change Psych:   Denies depression, insomnia or hallucinations   Other:  All other systems negative   VS: BP 106/70 (BP Location: Left Arm)   Pulse 99   Temp 98.1 F (36.7 C) (Oral)   Resp 16   Ht 5' 10"  (1.778 m)   Wt 81.2 kg   LMP 06/16/2015 Comment: Tubal Ligation   SpO2 96%   BMI 25.68 kg/m      PHYSICAL EXAM    GENERAL:NAD, no fevers, chills, no  weakness no fatigue HEAD: Normocephalic, atraumatic.  EYES: Pupils equal, round, reactive to light. Extraocular muscles intact. No scleral icterus.  MOUTH: dry oral  mucosa. Dentition intact. No abscess noted.  EAR, NOSE, THROAT: Clear without exudates. No external lesions.  NECK: Supple. No thyromegaly. No nodules. No JVD.  PULMONARY: mild rhonchi bilatarally CARDIOVASCULAR: S1 and S2. Regular rate and rhythm. No murmurs, rubs, or gallops. No edema. Pedal pulses 2+ bilaterally.  GASTROINTESTINAL: Soft, nontender, nondistended. No masses. Positive bowel sounds. No hepatosplenomegaly.  MUSCULOSKELETAL: No swelling, clubbing, or edema. Range of motion full in all extremities.  NEUROLOGIC: Cranial nerves II through XII are intact. No gross focal neurological deficits. Sensation intact. Reflexes intact.  SKIN: No ulceration, lesions, rashes, or cyanosis. Skin warm and dry. Turgor intact.  PSYCHIATRIC: Mood, affect within normal limits. The patient is awake, alert and oriented x 3. Insight, judgment intact.       IMAGING    DG Chest 2 View  Result Date: 10/07/2020 CLINICAL DATA:  Hypoxia, shortness of breath, metastatic breast cancer EXAM: CHEST - 2 VIEW COMPARISON:  09/13/2020 FINDINGS: LEFT subclavian Port-A-Cath with tip projecting over RIGHT atrium. Enlargement of cardiac silhouette. Pulmonary vascular congestion. Persistent enlargement of RIGHT hilum question mass or adenopathy. Increased infiltrate of the mid to lower RIGHT lung. Small RIGHT pleural effusion again seen. Accentuation of markings in LEFT perihilar region little changed. No pneumothorax or acute osseous findings. IMPRESSION: Increased RIGHT perihilar infiltrate with persistent small RIGHT pleural effusion. Enlargement of RIGHT pulmonary hilum by adenopathy versus perihilar mass. Electronically Signed   By: Lavonia Dana M.D.   On: 10/07/2020 11:32   CT Angio Chest PE W and/or Wo Contrast  Result Date: 10/07/2020 CLINICAL DATA:   Breast carcinoma. Tachycardia and shortness of breath EXAM: CT ANGIOGRAPHY CHEST WITH CONTRAST TECHNIQUE: Multidetector CT imaging of the chest was performed using the standard protocol during bolus administration of intravenous contrast. Multiplanar CT image reconstructions and MIPs were obtained to evaluate the vascular anatomy. CONTRAST:  75m OMNIPAQUE IOHEXOL 350 MG/ML SOLN COMPARISON:  CT angiogram chest June 15, 2020; chest radiograph October 07, 2020 FINDINGS: Cardiovascular: There is no evident pulmonary embolus. No appreciable thoracic aortic aneurysm or dissection. Visualized great vessels appear unremarkable. There is no appreciable pericardial effusion or pericardial thickening. Port-A-Cath tip is in the superior vena cava near the cavoatrial junction. Mediastinum/Nodes: Thyroid appears normal. There are multiple axillary lymph nodes bilaterally, more severe on the right than on the left. The largest lymph node is seen in the right axillary region measuring 2.7 x 2.1 cm. There is adenopathy in the right supraclavicular and infraclavicular regions with extension of adenopathy into the right Peri carinal region. Largest individual lymph node in these areas measures 2.6 x 1.8 cm. Several left supraclavicular lymph nodes are also noted, largest measuring 1.7 x 1.3 cm. There are several subcentimeter mediastinal lymph nodes. There is extensive retrocrural adenopathy. The largest lymph node or collection of matted lymph nodes is seen inferior to the aorta on the left posteriorly measuring 3.4 x 2.1 cm. No esophageal lesions are evident. Lungs/Pleura: There is extensive airspace consolidation in portions of the right middle and lower lobes with interspersed areas of loculated pleural effusion. There is ill-defined airspace opacity consistent with pneumonia in the left upper lobe. Loculated fluid tracks along the left major fissure. There may well be associated metastasis within the fissure given the  extensive nodularity in this area. There is a small left pleural effusion with left base atelectasis. Pleural metastases noted along each upper hemithorax, primarily posteriorly as well as in the right apex. Upper Abdomen: Retrocrural adenopathy extends into the upper abdominal region. Visualized  upper abdominal structures otherwise appear unremarkable. Musculoskeletal: There is a large mass occupying the right breast and chest wall region with invasion of the deep muscles of the chest wall on the right. There is also invasion of the right axilla. There is extensive subcutaneous thickening in both breast regions. There is a mass in the lateral left breast with extension of soft tissue prominence along the lateral left hemithorax, likely due to extension of neoplasm into the adjacent soft tissues. There is widespread sclerotic bony metastatic disease throughout the thoracic region. Review of the MIP images confirms the above findings. IMPRESSION: 1. No demonstrable pulmonary embolus. No thoracic aortic aneurysm or dissection. 2. Areas of airspace consolidation noted in the right middle and lower lobes. More ill-defined infiltrate is noted in the left upper lobe anteriorly. Areas of loculated fluid noted in the left major fissure. Nodularity in this area could indicate superimposed masses in left major fissure. There is pleural thickening along both posterior upper hemithorax regions, likely pleural metastases. Loculated fluid noted in areas of infiltrate on the right. Small left pleural effusion noted. 3. Adenopathy at multiple sites, most severe in the retrocrural regions bilaterally, right supraclavicular region, and right axillary region, although there is left axillary and left supraclavicular adenopathy. 4. Large mass lesions occupying the right breast with extension and invasion of the chest wall and right axillary regions. A lesser degree of metastatic disease to the left of midline in the subcutaneous tissues  and lateral left breast/lateral left chest wall soft tissues noted. 5. Multifocal sclerotic bony metastatic disease throughout the thoracic region noted. Electronically Signed   By: Lowella Grip III M.D.   On: 10/07/2020 13:23   US Venous Img Upper Uni Right(DVT)  Result Date: 10/08/2020 CLINICAL DATA:  History of stage IV breast cancer now with right upper extremity pain and edema. Evaluate for DVT. EXAM: RIGHT UPPER EXTREMITY VENOUS DOPPLER ULTRASOUND TECHNIQUE: Gray-scale sonography with graded compression, as well as color Doppler and duplex ultrasound were performed to evaluate the upper extremity deep venous system from the level of the subclavian vein and including the jugular, axillary, basilic, radial, ulnar and upper cephalic vein. Spectral Doppler was utilized to evaluate flow at rest and with distal augmentation maneuvers. COMPARISON:  None. FINDINGS: Contralateral Subclavian Vein: Respiratory phasicity is normal and symmetric with the symptomatic side. No evidence of thrombus. Normal compressibility. Internal Jugular Vein: No evidence of thrombus. Normal compressibility, respiratory phasicity and response to augmentation. Subclavian Vein: No evidence of thrombus. Normal compressibility, respiratory phasicity and response to augmentation. Axillary Vein: Appears patent where imaged though evaluation degraded secondary to patient's discomfort with sonographic evaluation of the axilla. Cephalic Vein: No evidence of thrombus. Normal compressibility, respiratory phasicity and response to augmentation. Basilic Vein: No evidence of thrombus. Normal compressibility, respiratory phasicity and response to augmentation. Brachial Veins: No evidence of thrombus within either of the paired brachial veins. Normal compressibility, respiratory phasicity and response to augmentation. Radial Veins: No evidence of thrombus. Normal compressibility, respiratory phasicity and response to augmentation. Ulnar Veins: No  evidence of thrombus. Normal compressibility, respiratory phasicity and response to augmentation. Venous Reflux:  None visualized. Other Findings: There is a moderate amount of subcutaneous edema at the level of the right upper arm. IMPRESSION: No evidence of DVT within the right upper extremity. Electronically Signed   By: Sandi Mariscal M.D.   On: 10/08/2020 14:29   DG Chest Port 1 View  Result Date: 10/15/2020 CLINICAL DATA:  Pneumonia and pleural effusion. Metastatic breast cancer.  EXAM: PORTABLE CHEST 1 VIEW COMPARISON:  10/13/2020 chest radiograph and prior studies FINDINGS: Cardiomediastinal silhouette is unchanged with RIGHT mediastinal fullness. A LEFT subclavian Port-A-Cath with tip overlying the UPPER RIGHT atrium again noted. Scattered opacities within both lungs are again noted with a RIGHT pleural effusion. There is no evidence of pneumothorax. No significant changes are identified. IMPRESSION: Unchanged appearance of the chest with scattered opacities within both lungs and RIGHT pleural effusion. Electronically Signed   By: Margarette Canada M.D.   On: 10/15/2020 11:06   DG Chest Port 1 View  Result Date: 10/13/2020 CLINICAL DATA:  Pleural effusion EXAM: PORTABLE CHEST 1 VIEW COMPARISON:  10/07/2020 FINDINGS: Unchanged positioning of left-sided Port-A-Cath. Stable cardiomediastinal contours. Persistent small right pleural effusion. Slight progression of airspace opacity throughout the right lung most pronounced within the right lung base. Similar interstitial opacities throughout the left lung. No pneumothorax. IMPRESSION: 1. Slight progression of airspace opacity throughout the right lung, most pronounced within the right lung base. 2. Persistent small right pleural effusion. Electronically Signed   By: Davina Poke D.O.   On: 10/13/2020 14:10   ECHOCARDIOGRAM COMPLETE  Result Date: 10/10/2020    ECHOCARDIOGRAM REPORT   Patient Name:   ANGELICE PIECH Date of Exam: 10/10/2020 Medical Rec #:   [id number]           Height:       70.0 in Accession #:    [id number]          Weight:       178.7 lb Date of Birth:  04/08/1966           BSA:          1.990 m Patient Age:    76 years            BP:           119/79 mmHg Patient Gender: F                   HR:           105 bpm. Exam Location:  [facility] Procedure: 2D Echo, Limited Color Doppler and Cardiac Doppler Indications:     R06.03 Acute Respiratory Distress  History:         Patient has no prior history of Echocardiogram examinations.                  Signs/Symptoms:Shortness of Breath. Breast cancer.  Sonographer:     Charmayne Sheer RDCS (AE) Referring Phys:  [id number] Sharen Hones Diagnosing Phys: Kathlyn Sacramento MD  Sonographer Comments: Technically challenging study due to limited acoustic windows. IMPRESSIONS  1. Left ventricular ejection fraction, by estimation, is 50 to 55%. The left ventricle has low normal function. Left ventricular endocardial border not optimally defined to evaluate regional wall motion. Left ventricular diastolic parameters are indeterminate.  2. Right ventricular systolic function is normal. The right ventricular size is normal. Tricuspid regurgitation signal is inadequate for assessing PA pressure.  3. The mitral valve is normal in structure. No evidence of mitral valve regurgitation. No evidence of mitral stenosis.  4. The aortic valve is normal in structure. Aortic valve regurgitation is not visualized. No aortic stenosis is present.  5. The inferior vena cava is normal in size with greater than 50% respiratory variability, suggesting right atrial pressure of 3 mmHg. FINDINGS  Left Ventricle: Left ventricular ejection fraction, by estimation, is 50 to 55%. The left ventricle has low normal function. Left ventricular endocardial border not  optimally defined to evaluate regional wall motion. The left ventricular internal cavity  size was normal in size. There is no left ventricular hypertrophy. Left ventricular diastolic parameters are  indeterminate. Right Ventricle: The right ventricular size is normal. No increase in right ventricular wall thickness. Right ventricular systolic function is normal. Tricuspid regurgitation signal is inadequate for assessing PA pressure. Left Atrium: Left atrial size was normal in size. Right Atrium: Right atrial size was normal in size. Pericardium: There is no evidence of pericardial effusion. Mitral Valve: The mitral valve is normal in structure. No evidence of mitral valve regurgitation. No evidence of mitral valve stenosis. MV peak gradient, 4.5 mmHg. The mean mitral valve gradient is 2.0 mmHg. Tricuspid Valve: The tricuspid valve is normal in structure. Tricuspid valve regurgitation is not demonstrated. No evidence of tricuspid stenosis. Aortic Valve: The aortic valve is normal in structure. Aortic valve regurgitation is not visualized. No aortic stenosis is present. Aortic valve mean gradient measures 4.0 mmHg. Aortic valve peak gradient measures 6.8 mmHg. Pulmonic Valve: The pulmonic valve was not well visualized. Pulmonic valve regurgitation is not visualized. No evidence of pulmonic stenosis. Aorta: The aortic root is normal in size and structure. Venous: The inferior vena cava is normal in size with greater than 50% respiratory variability, suggesting right atrial pressure of 3 mmHg. IAS/Shunts: No atrial level shunt detected by color flow Doppler.   LV Volumes (MOD) LV vol d, MOD A4C: 99.4 ml Diastology LV vol s, MOD A4C: 58.3 ml LV e' lateral:   10.30 cm/s LV SV MOD A4C:     99.4 ml LV E/e' lateral: 5.8  LEFT ATRIUM           Index LA Vol (A4C): 41.5 ml 20.86 ml/m  AORTIC VALVE AV Vmax:           130.00 cm/s AV Vmean:          93.400 cm/s AV VTI:            0.243 m AV Peak Grad:      6.8 mmHg AV Mean Grad:      4.0 mmHg LVOT Vmax:         92.70 cm/s LVOT Vmean:        60.500 cm/s LVOT VTI:          0.151 m LVOT/AV VTI ratio: 0.62 MITRAL VALVE MV Area (PHT): 6.96 cm    SHUNTS MV Peak grad:  4.5 mmHg     Systemic VTI: 0.15 m MV Mean grad:  2.0 mmHg MV Vmax:       1.06 m/s MV Vmean:      67.9 cm/s MV Decel Time: 109 msec MV E velocity: 59.60 cm/s MV A velocity: 68.60 cm/s MV E/A ratio:  0.87 Kathlyn Sacramento MD Electronically signed by Kathlyn Sacramento MD Signature Date/Time: 10/10/2020/2:00:06 PM    Final          ASSESSMENT/PLAN   Malignant pleural effusion    - since patient had initial therapeutic response to thoracentesis she will likely benefit from permament tunneled pleural catheter via IR service as palliative option for malignant effusion due to high probability of rapid re-accumulation    -there is no renal failure, hepatic synthetic function within reference range without HASH/cirhosis, 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  She is a good candidate for albuterol infused Metaneb therapy can do BID to help with recruitment.     Chronic deconditioning and severe protein calorie malnutrition     -nutritional and PT evaluation     -palliative care on case - appreciate input     -bitemporal wasting noted on examination     - high risk refeeding syndrome - monitor electorlytes - will place consult for pharmD and RD     Acute hypoxemic respiratory failure    - patient generally not on supplemental O2 currently on 7L/min supplemental O2>>4>>5- 10/15/20    - she has leukocytosis but gram stain is negative on pleural fluid and there is no obvious infiltrate although basis bilaterally may have pneumonia, this is obscured by effusion and atelectatic sement.  Legionella and strep pneumo ag is negative. I feel that CAP is less likely.  Have ordered iflammatory markers and PCT.      - lasix to help with residual effusion     -Soluedrol 20 bid IV>>prednisone  taper 10/18/20     - lasix 40 iv daily >>bid 10/16/20     - albumin 68m 25% 1 amd daily with diuresis     - chest physitherapy to recruit atelectatic lungs     -etiology is all of the above conditions     - TTE with no findings to suggest CHF realted hypoxemia  -repeat cxr 10/15/20- reviewed     Thank you for allowing me to participate in the care of this patient.   Patient/Family are satisfied with care plan and all questions have been answered.  This document was prepared using Dragon voice recognition software and may include unintentional dictation errors.     FOttie Glazier M.D.  Division of [facility]

## 2020-10-19 NOTE — TOC Initial Note (Signed)
Transition of Care [facility]) - Initial/Assessment Note    Patient Details  Name: Jessica Willis MRN: [id number] Date of Birth: August 16, 1965  [facility] CM/SW Contact:    Pete Pelt, RN Phone Number: 10/19/2020, 11:07 AM  Clinical Narrative:        TOC visited patient at bedside, introductions made, daughter present, patient gave permission to speak in front of daughter.  Purpose of visit:  Discharge planning.  Current recommendation is home with Home Health.  Patient and daughter amenable to plan, at this time they do not have a preference of agencies.  Patient currently lives with family, who can assist with care, states she has a good support system.  No concerns with transportation for appointments.  Uses [facility], no concerns in regards to obtaining medications.  No other questions/concerns for TOC at this time.  TOC contact information given to patient and family.  TOC to follow through discharge.           Expected Discharge Plan: [place] Barriers to Discharge: Continued Medical Work up   Patient Goals and CMS Choice     Choice offered to / list presented to : NA  Expected Discharge Plan and Services Expected Discharge Plan: [place]       Living arrangements for the past 2 months: Single Family Home                                      Prior Living Arrangements/Services Living arrangements for the past 2 months: Single Family Home Lives with:: Adult Children Patient language and need for interpreter reviewed:: Yes Do you feel safe going back to the place where you live?: Yes      Need for Family Participation in Patient Care: Yes (Comment) Care giver support system in place?: Yes (comment) Current home services: Other (comment) (No current home services.) Criminal Activity/Legal Involvement Pertinent to Current Situation/Hospitalization: No - Comment as needed  Activities of Daily Living Home  Assistive Devices/Equipment: None ADL Screening (condition at time of admission) Patient's cognitive ability adequate to safely complete daily activities?: Yes Is the patient deaf or have difficulty hearing?: Yes Does the patient have difficulty seeing, even when wearing glasses/contacts?: No Does the patient have difficulty concentrating, remembering, or making decisions?: No Patient able to express need for assistance with ADLs?: Yes Does the patient have difficulty dressing or bathing?: No Independently performs ADLs?: Yes (appropriate for developmental age) Does the patient have difficulty walking or climbing stairs?: No Weakness of Legs: Both Weakness of Arms/Hands: None  Permission Sought/Granted   Permission granted to share information with : Yes, Verbal Permission Granted  Share Information with NAME: Daughter at bedside, patient gave permission to speak with her.           Emotional Assessment Appearance:: Appears stated age Attitude/Demeanor/Rapport: Gracious,Engaged Affect (typically observed): Calm,Quiet,Pleasant Orientation: : Oriented to Self,Oriented to Place,Oriented to  Time,Oriented to Situation Alcohol / Substance Use: Not Applicable Psych Involvement: No (comment)  Admission diagnosis:  HCAP (healthcare-associated pneumonia) [J18.9] Patient Active Problem List   Diagnosis Date Noted  . Chemotherapy-induced nausea   . Poor appetite   . HCAP (healthcare-associated pneumonia) 10/07/2020  . QT prolongation 09/22/2020  . Pneumonia due to Streptococcus pyogenes ([place]) 08/22/2020  . Genetic testing 07/27/2020  . Pancreatic lesion 07/22/2020  . Swelling of lower leg 07/13/2020  .  Encounter for antineoplastic chemotherapy 07/04/2020  . Complication of chemotherapy 07/04/2020  . Family history of breast cancer   . Port-A-Cath in place   . Bone metastasis ([place])   . Hypocalcemia   . Folate deficiency   . Gastroesophageal reflux disease without esophagitis   .  Weakness of right arm   . Palliative care encounter   . Iron deficiency anemia   . Anemia, chronic disease   . Tachycardia   . Metastatic breast cancer ([place])   . Malignant pleural effusion   . Chest tube in place   . Goals of care, counseling/discussion   . Hemothorax on right 06/16/2020  . Acute respiratory failure with hypoxia ([place])   . Breast mass, right 06/15/2020  . Mastitis 06/15/2020  . Pleural effusion 06/15/2020  . Intestinal obstruction ([place])   . Leukocytosis   . Hypokalemia 06/24/2015  . Sepsis ([place]) 06/23/2015  . CAP (community acquired pneumonia) 06/23/2015  . Partial small bowel obstruction ([place]) 06/23/2015   PCP:  [facility]:   [place], [place] - [address] [place] [address] Phone: [phone] Fax: [phone]     Social Determinants of Health (SDOH) Interventions    Readmission Risk Interventions Readmission Risk Prevention Plan 10/19/2020  Transportation Screening Complete  Medication Review ([place]) Complete  PCP or Specialist appointment within 3-5 days of discharge Complete  HRI or [place] Not Applicable  Some recent data might be hidden

## 2020-10-19 NOTE — Progress Notes (Signed)
Pt resting in the chair at this time satting at 93% on 3L. Attempted walking test to determine O2 requirement, pt declines at this time. Will attempt later.

## 2020-10-19 NOTE — Progress Notes (Addendum)
Hematology/Oncology Progress Note [facility] [phone] [phone]  Patient Care Team: [place] as PCP - General Rico Junker, RN as Registered Nurse Benjamine Sprague, DO as Consulting Physician (Surgery)   Name of the patient: Jessica Willis  [id number]  1965-09-20  Date of visit: 10/19/20   INTERVAL HISTORY-  Patient reports feeling the same.  Breathing status no change.  No acute overnight event.  No appetite.  Review of systems- Review of Systems  Constitutional: Positive for appetite change, fatigue and unexpected weight change. Negative for chills and fever.  HENT:   Negative for hearing loss and voice change.   Eyes: Negative for eye problems.  Respiratory: Positive for shortness of breath. Negative for chest tightness and cough.   Cardiovascular: Negative for chest pain.  Gastrointestinal: Negative for abdominal distention, abdominal pain, blood in stool and nausea.  Endocrine: Negative for hot flashes.  Genitourinary: Negative for difficulty urinating and frequency.   Musculoskeletal: Negative for arthralgias.  Skin: Negative for itching and rash.  Neurological: Negative for extremity weakness.  Hematological: Negative for adenopathy.  Psychiatric/Behavioral: Negative for confusion.    No Known Allergies  Patient Active Problem List   Diagnosis Date Noted  . Chemotherapy-induced nausea   . Poor appetite   . HCAP (healthcare-associated pneumonia) 10/07/2020  . QT prolongation 09/22/2020  . Pneumonia due to Streptococcus pyogenes ([place]) 08/22/2020  . Genetic testing 07/27/2020  . Pancreatic lesion 07/22/2020  . Swelling of lower leg 07/13/2020  . Encounter for antineoplastic chemotherapy 07/04/2020  . Complication of chemotherapy 07/04/2020  . Family history of breast cancer   . Port-A-Cath in place   . Bone metastasis ([place])   . Hypocalcemia   . Folate deficiency   . Gastroesophageal  reflux disease without esophagitis   . Weakness of right arm   . Palliative care encounter   . Iron deficiency anemia   . Anemia, chronic disease   . Tachycardia   . Metastatic breast cancer ([place])   . Malignant pleural effusion   . Chest tube in place   . Goals of care, counseling/discussion   . Hemothorax on right 06/16/2020  . Acute respiratory failure with hypoxia ([place])   . Breast mass, right 06/15/2020  . Mastitis 06/15/2020  . Pleural effusion 06/15/2020  . Intestinal obstruction ([place])   . Leukocytosis   . Hypokalemia 06/24/2015  . Sepsis ([place]) 06/23/2015  . CAP (community acquired pneumonia) 06/23/2015  . Partial small bowel obstruction ([place]) 06/23/2015     Past Medical History:  Diagnosis Date  . Anemia   . Family history of breast cancer   . Patient denies medical problems      Past Surgical History:  Procedure Laterality Date  . BREAST BIOPSY  06/28/2020   Procedure: BREAST BIOPSY;  Surgeon: Benjamine Sprague, DO;  Location: [facility] ORS;  Service: General;;  . PORTACATH PLACEMENT N/A 06/28/2020   Procedure: INSERTION PORT-A-CATH;  Surgeon: Benjamine Sprague, DO;  Location: [facility] ORS;  Service: General;  Laterality: N/A;  . TUBAL LIGATION    . VIDEO ASSISTED THORACOSCOPY (VATS)/THOROCOTOMY Right 06/23/2020   Procedure: ATTEMPTED VIDEO ASSISTED THORACOSCOPY (VATS);  Surgeon: Nestor Lewandowsky, MD;  Location: [facility] ORS;  Service: General;  Laterality: Right;    Social History   Socioeconomic History  . Marital status: Single    Spouse name: Not on file  . Number of children: Not on file  . Years of education: Not on file  . Highest education level: Not on  file  Occupational History  . Not on file  Tobacco Use  . Smoking status: Never Smoker  . Smokeless tobacco: Never Used  Vaping Use  . Vaping Use: Never used  Substance and Sexual Activity  . Alcohol use: Not Currently    Alcohol/week: 1.0 standard drink    Types: 1 Cans of beer per week  . Drug use: No  . Sexual  activity: Not on file  Other Topics Concern  . Not on file  Social History Narrative  . Not on file   Social Determinants of Health   Financial Resource Strain: Not on file  Food Insecurity: Not on file  Transportation Needs: Not on file  Physical Activity: Not on file  Stress: Not on file  Social Connections: Not on file  Intimate Partner Violence: Not on file     Family History  Problem Relation Age of Onset  . Diabetes Other   . Hypertension Other   . Diabetes Maternal Aunt   . Breast cancer Cousin        dx 88s  . Breast cancer Cousin        dx 9s     Current Facility-Administered Medications:  .  acetaminophen (TYLENOL) tablet 650 mg, 650 mg, Oral, Q6H PRN, Ivor Costa, MD, 650 mg at 10/09/20 2144 .  albumin human 25 % solution 12.5 g, 12.5 g, Intravenous, Daily, Lanney Gins, Fuad, MD, Last Rate: 60 mL/hr at 10/19/20 0921, 12.5 g at 10/19/20 0921 .  albuterol (PROVENTIL) (2.5 MG/3ML) 0.083% nebulizer solution 2.5 mg, 2.5 mg, Nebulization, Q6H PRN, Samuella Cota, MD, 2.5 mg at 10/16/20 1612 .  albuterol (VENTOLIN HFA) 108 (90 Base) MCG/ACT inhaler 2 puff, 2 puff, Inhalation, Q4H PRN, Ivor Costa, MD .  bisacodyl (DULCOLAX) suppository 10 mg, 10 mg, Rectal, Daily PRN, Samuella Cota, MD .  calcium carbonate (OS-CAL - dosed in mg of elemental calcium) tablet 1,250 mg, 1,250 mg, Oral, Q breakfast, Ivor Costa, MD, 1,250 mg at 10/19/20 0907 .  Chlorhexidine Gluconate Cloth 2 % PADS 6 each, 6 each, Topical, Daily, Sharen Hones, MD, 6 each at 10/19/20 [phone]  dextromethorphan-guaiFENesin (MUCINEX DM) 30-600 MG per 12 hr tablet 1 tablet, 1 tablet, Oral, BID PRN, Ivor Costa, MD, 1 tablet at 10/19/20 0907 .  senna (SENOKOT) tablet 17.2 mg, 2 tablet, Oral, BID, 17.2 mg at 10/18/20 1026 **AND** docusate sodium (COLACE) capsule 100 mg, 100 mg, Oral, BID, Samuella Cota, MD, 100 mg at 10/18/20 1026 .  dronabinol (MARINOL) capsule 5 mg, 5 mg, Oral, BID AC, Earlie Server, MD, 5 mg at  10/19/20 1601 .  enoxaparin (LOVENOX) injection 40 mg, 40 mg, Subcutaneous, Q24H, Ivor Costa, MD, 40 mg at 10/18/20 2234 .  feeding supplement (ENSURE ENLIVE / ENSURE PLUS) liquid 237 mL, 237 mL, Oral, QID, Lorella Nimrod, MD, 237 mL at 10/19/20 1731 .  ferrous sulfate tablet 325 mg, 325 mg, Oral, BID WC, Ivor Costa, MD, 325 mg at 10/19/20 1600 .  [START ON 10/20/2020] furosemide (LASIX) injection 40 mg, 40 mg, Intravenous, Daily, Aleskerov, Fuad, MD .  lidocaine-prilocaine (EMLA) cream 1 application, 1 application, Topical, PRN, Ivor Costa, MD .  magic mouthwash w/lidocaine, 15 mL, Oral, TID, Lorella Nimrod, MD .  magnesium chloride (SLOW-MAG) 64 MG SR tablet 64 mg, 1 tablet, Oral, Daily, Ivor Costa, MD, 64 mg at 10/19/20 0907 .  melatonin tablet 5 mg, 5 mg, Oral, QHS, Sharen Hones, MD, 5 mg at 10/18/20 2234 .  metoprolol tartrate (LOPRESSOR)  tablet 50 mg, 50 mg, Oral, BID, Ivor Costa, MD, 50 mg at 10/19/20 0907 .  multivitamin with minerals tablet 1 tablet, 1 tablet, Oral, Daily, Samuella Cota, MD, 1 tablet at 10/19/20 0906 .  ondansetron (ZOFRAN) injection 4 mg, 4 mg, Intravenous, Q8H PRN, Ivor Costa, MD, 4 mg at 10/17/20 1309 .  polyethylene glycol (MIRALAX / GLYCOLAX) packet 17 g, 17 g, Oral, BID, Samuella Cota, MD, 17 g at 10/18/20 1024 .  potassium chloride SA (KLOR-CON) CR tablet 20 mEq, 20 mEq, Oral, Daily, Earlie Server, MD, 20 mEq at 10/19/20 0927 .  [START ON 10/20/2020] predniSONE (DELTASONE) tablet 50 mg, 50 mg, Oral, Q breakfast, Aleskerov, Fuad, MD .  scopolamine (TRANSDERM-SCOP) 1 MG/3DAYS 1.5 mg, 1 patch, Transdermal, Q72H, Earlie Server, MD, 1.5 mg at 10/17/20 1437 .  simethicone (MYLICON) chewable tablet 80 mg, 80 mg, Oral, Daily PRN, Earlie Server, MD, 80 mg at 10/15/20 1002 .  sodium chloride (OCEAN) 0.65 % nasal spray 1 spray, 1 spray, Each Nare, PRN, Samuella Cota, MD .  sodium chloride flush (NS) 0.9 % injection 10-40 mL, 10-40 mL, Intracatheter, Q12H, Sharen Hones, MD, 10 mL  at 10/19/20 0928 .  sodium chloride flush (NS) 0.9 % injection 10-40 mL, 10-40 mL, Intracatheter, PRN, Sharen Hones, MD, 10 mL at 10/10/20 1447   Physical exam:  Vitals:   10/19/20 0014 10/19/20 0325 10/19/20 0818 10/19/20 1553  BP: 108/67 99/60 106/70 105/65  Pulse: (!) 103 97 99 99  Resp: 20 18 16 16   Temp: (!) 97.1 F (36.2 C) 98 F (36.7 C) 98.1 F (36.7 C) 97.9 F (36.6 C)  TempSrc:   Oral Axillary  SpO2: 99% 99% 96% 92%  Weight:      Height:       Physical Exam Constitutional:      General: She is not in acute distress.    Comments: Cachectic  HENT:     Head: Normocephalic and atraumatic.     Nose: Nose normal.     Mouth/Throat:     Pharynx: No oropharyngeal exudate.     Comments: [place]ush Eyes:     General: No scleral icterus.    Pupils: Pupils are equal, round, and reactive to light.  Cardiovascular:     Rate and Rhythm: Normal rate and regular rhythm.     Heart sounds: No murmur heard.   Pulmonary:     Effort: Pulmonary effort is normal. No respiratory distress.     Comments: Decreased right lower base breath sound Rhonchi Abdominal:     General: There is no distension.     Palpations: Abdomen is soft.     Tenderness: There is no abdominal tenderness.  Musculoskeletal:        General: Normal range of motion.     Cervical back: Normal range of motion and neck supple.  Skin:    General: Skin is warm and dry.     Findings: No erythema.  Neurological:     Mental Status: She is alert and oriented to person, place, and time.     Cranial Nerves: No cranial nerve deficit.     Motor: No abnormal muscle tone.     Coordination: Coordination normal.  Psychiatric:        Mood and Affect: Affect normal.       CMP Latest Ref Rng & Units 10/19/2020  Glucose 70 - 99 mg/dL 98  BUN 6 - 20 mg/dL 24(H)  Creatinine 0.44 - 1.00 mg/dL 0.49  Sodium 135 - 145 mmol/L 142  Potassium 3.5 - 5.1 mmol/L 4.3  Chloride 98 - 111 mmol/L 98  CO2 22 - 32 mmol/L 34(H)  Calcium  8.9 - 10.3 mg/dL 8.4(L)  Total Protein 6.5 - 8.1 g/dL -  Total Bilirubin 0.3 - 1.2 mg/dL -  Alkaline Phos 38 - 126 U/L -  AST 15 - 41 U/L -  ALT 0 - 44 U/L -   CBC Latest Ref Rng & Units 10/18/2020  WBC 4.0 - 10.5 K/uL 20.4(H)  Hemoglobin 12.0 - 15.0 g/dL 9.1(L)  Hematocrit 36.0 - 46.0 % 30.9(L)  Platelets 150 - 400 K/uL 351    RADIOGRAPHIC STUDIES: I have personally reviewed the radiological images as listed and agreed with the findings in the report. DG Chest 2 View  Result Date: 10/07/2020 CLINICAL DATA:  Hypoxia, shortness of breath, metastatic breast cancer EXAM: CHEST - 2 VIEW COMPARISON:  09/13/2020 FINDINGS: LEFT subclavian Port-A-Cath with tip projecting over RIGHT atrium. Enlargement of cardiac silhouette. Pulmonary vascular congestion. Persistent enlargement of RIGHT hilum question mass or adenopathy. Increased infiltrate of the mid to lower RIGHT lung. Small RIGHT pleural effusion again seen. Accentuation of markings in LEFT perihilar region little changed. No pneumothorax or acute osseous findings. IMPRESSION: Increased RIGHT perihilar infiltrate with persistent small RIGHT pleural effusion. Enlargement of RIGHT pulmonary hilum by adenopathy versus perihilar mass. Electronically Signed   By: Lavonia Dana M.D.   On: 10/07/2020 11:32   CT Angio Chest PE W and/or Wo Contrast  Result Date: 10/07/2020 CLINICAL DATA:  Breast carcinoma. Tachycardia and shortness of breath EXAM: CT ANGIOGRAPHY CHEST WITH CONTRAST TECHNIQUE: Multidetector CT imaging of the chest was performed using the standard protocol during bolus administration of intravenous contrast. Multiplanar CT image reconstructions and MIPs were obtained to evaluate the vascular anatomy. CONTRAST:  21mL OMNIPAQUE IOHEXOL 350 MG/ML SOLN COMPARISON:  CT angiogram chest June 15, 2020; chest radiograph October 07, 2020 FINDINGS: Cardiovascular: There is no evident pulmonary embolus. No appreciable thoracic aortic aneurysm or  dissection. Visualized great vessels appear unremarkable. There is no appreciable pericardial effusion or pericardial thickening. Port-A-Cath tip is in the superior vena cava near the cavoatrial junction. Mediastinum/Nodes: Thyroid appears normal. There are multiple axillary lymph nodes bilaterally, more severe on the right than on the left. The largest lymph node is seen in the right axillary region measuring 2.7 x 2.1 cm. There is adenopathy in the right supraclavicular and infraclavicular regions with extension of adenopathy into the right Peri carinal region. Largest individual lymph node in these areas measures 2.6 x 1.8 cm. Several left supraclavicular lymph nodes are also noted, largest measuring 1.7 x 1.3 cm. There are several subcentimeter mediastinal lymph nodes. There is extensive retrocrural adenopathy. The largest lymph node or collection of matted lymph nodes is seen inferior to the aorta on the left posteriorly measuring 3.4 x 2.1 cm. No esophageal lesions are evident. Lungs/Pleura: There is extensive airspace consolidation in portions of the right middle and lower lobes with interspersed areas of loculated pleural effusion. There is ill-defined airspace opacity consistent with pneumonia in the left upper lobe. Loculated fluid tracks along the left major fissure. There may well be associated metastasis within the fissure given the extensive nodularity in this area. There is a small left pleural effusion with left base atelectasis. Pleural metastases noted along each upper hemithorax, primarily posteriorly as well as in the right apex. Upper Abdomen: Retrocrural adenopathy extends into the upper abdominal region. Visualized upper abdominal structures otherwise appear  unremarkable. Musculoskeletal: There is a large mass occupying the right breast and chest wall region with invasion of the deep muscles of the chest wall on the right. There is also invasion of the right axilla. There is extensive  subcutaneous thickening in both breast regions. There is a mass in the lateral left breast with extension of soft tissue prominence along the lateral left hemithorax, likely due to extension of neoplasm into the adjacent soft tissues. There is widespread sclerotic bony metastatic disease throughout the thoracic region. Review of the MIP images confirms the above findings. IMPRESSION: 1. No demonstrable pulmonary embolus. No thoracic aortic aneurysm or dissection. 2. Areas of airspace consolidation noted in the right middle and lower lobes. More ill-defined infiltrate is noted in the left upper lobe anteriorly. Areas of loculated fluid noted in the left major fissure. Nodularity in this area could indicate superimposed masses in left major fissure. There is pleural thickening along both posterior upper hemithorax regions, likely pleural metastases. Loculated fluid noted in areas of infiltrate on the right. Small left pleural effusion noted. 3. Adenopathy at multiple sites, most severe in the retrocrural regions bilaterally, right supraclavicular region, and right axillary region, although there is left axillary and left supraclavicular adenopathy. 4. Large mass lesions occupying the right breast with extension and invasion of the chest wall and right axillary regions. A lesser degree of metastatic disease to the left of midline in the subcutaneous tissues and lateral left breast/lateral left chest wall soft tissues noted. 5. Multifocal sclerotic bony metastatic disease throughout the thoracic region noted. Electronically Signed   By: Lowella Grip III M.D.   On: 10/07/2020 13:23   US Venous Img Upper Uni Right(DVT)  Result Date: 10/08/2020 CLINICAL DATA:  History of stage IV breast cancer now with right upper extremity pain and edema. Evaluate for DVT. EXAM: RIGHT UPPER EXTREMITY VENOUS DOPPLER ULTRASOUND TECHNIQUE: Gray-scale sonography with graded compression, as well as color Doppler and duplex ultrasound  were performed to evaluate the upper extremity deep venous system from the level of the subclavian vein and including the jugular, axillary, basilic, radial, ulnar and upper cephalic vein. Spectral Doppler was utilized to evaluate flow at rest and with distal augmentation maneuvers. COMPARISON:  None. FINDINGS: Contralateral Subclavian Vein: Respiratory phasicity is normal and symmetric with the symptomatic side. No evidence of thrombus. Normal compressibility. Internal Jugular Vein: No evidence of thrombus. Normal compressibility, respiratory phasicity and response to augmentation. Subclavian Vein: No evidence of thrombus. Normal compressibility, respiratory phasicity and response to augmentation. Axillary Vein: Appears patent where imaged though evaluation degraded secondary to patient's discomfort with sonographic evaluation of the axilla. Cephalic Vein: No evidence of thrombus. Normal compressibility, respiratory phasicity and response to augmentation. Basilic Vein: No evidence of thrombus. Normal compressibility, respiratory phasicity and response to augmentation. Brachial Veins: No evidence of thrombus within either of the paired brachial veins. Normal compressibility, respiratory phasicity and response to augmentation. Radial Veins: No evidence of thrombus. Normal compressibility, respiratory phasicity and response to augmentation. Ulnar Veins: No evidence of thrombus. Normal compressibility, respiratory phasicity and response to augmentation. Venous Reflux:  None visualized. Other Findings: There is a moderate amount of subcutaneous edema at the level of the right upper arm. IMPRESSION: No evidence of DVT within the right upper extremity. Electronically Signed   By: Sandi Mariscal M.D.   On: 10/08/2020 14:29   DG Chest Port 1 View  Result Date: 10/15/2020 CLINICAL DATA:  Pneumonia and pleural effusion. Metastatic breast cancer. EXAM: PORTABLE CHEST 1 VIEW  COMPARISON:  10/13/2020 chest radiograph and prior  studies FINDINGS: Cardiomediastinal silhouette is unchanged with RIGHT mediastinal fullness. A LEFT subclavian Port-A-Cath with tip overlying the UPPER RIGHT atrium again noted. Scattered opacities within both lungs are again noted with a RIGHT pleural effusion. There is no evidence of pneumothorax. No significant changes are identified. IMPRESSION: Unchanged appearance of the chest with scattered opacities within both lungs and RIGHT pleural effusion. Electronically Signed   By: Margarette Canada M.D.   On: 10/15/2020 11:06   DG Chest Port 1 View  Result Date: 10/13/2020 CLINICAL DATA:  Pleural effusion EXAM: PORTABLE CHEST 1 VIEW COMPARISON:  10/07/2020 FINDINGS: Unchanged positioning of left-sided Port-A-Cath. Stable cardiomediastinal contours. Persistent small right pleural effusion. Slight progression of airspace opacity throughout the right lung most pronounced within the right lung base. Similar interstitial opacities throughout the left lung. No pneumothorax. IMPRESSION: 1. Slight progression of airspace opacity throughout the right lung, most pronounced within the right lung base. 2. Persistent small right pleural effusion. Electronically Signed   By: Davina Poke D.O.   On: 10/13/2020 14:10   ECHOCARDIOGRAM COMPLETE  Result Date: 10/10/2020    ECHOCARDIOGRAM REPORT   Patient Name:   ANTONELA FREIMAN Date of Exam: 10/10/2020 Medical Rec #:  [id number]           Height:       70.0 in Accession #:    [id number]          Weight:       178.7 lb Date of Birth:  06-06-1966           BSA:          1.990 m Patient Age:    55 years            BP:           119/79 mmHg Patient Gender: F                   HR:           105 bpm. Exam Location:  [facility] Procedure: 2D Echo, Limited Color Doppler and Cardiac Doppler Indications:     R06.03 Acute Respiratory Distress  History:         Patient has no prior history of Echocardiogram examinations.                  Signs/Symptoms:Shortness of Breath. Breast cancer.   Sonographer:     Charmayne Sheer RDCS (AE) Referring Phys:  [id number] Sharen Hones Diagnosing Phys: Kathlyn Sacramento MD  Sonographer Comments: Technically challenging study due to limited acoustic windows. IMPRESSIONS  1. Left ventricular ejection fraction, by estimation, is 50 to 55%. The left ventricle has low normal function. Left ventricular endocardial border not optimally defined to evaluate regional wall motion. Left ventricular diastolic parameters are indeterminate.  2. Right ventricular systolic function is normal. The right ventricular size is normal. Tricuspid regurgitation signal is inadequate for assessing PA pressure.  3. The mitral valve is normal in structure. No evidence of mitral valve regurgitation. No evidence of mitral stenosis.  4. The aortic valve is normal in structure. Aortic valve regurgitation is not visualized. No aortic stenosis is present.  5. The inferior vena cava is normal in size with greater than 50% respiratory variability, suggesting right atrial pressure of 3 mmHg. FINDINGS  Left Ventricle: Left ventricular ejection fraction, by estimation, is 50 to 55%. The left ventricle has low normal function. Left ventricular endocardial border not optimally defined to evaluate  regional wall motion. The left ventricular internal cavity  size was normal in size. There is no left ventricular hypertrophy. Left ventricular diastolic parameters are indeterminate. Right Ventricle: The right ventricular size is normal. No increase in right ventricular wall thickness. Right ventricular systolic function is normal. Tricuspid regurgitation signal is inadequate for assessing PA pressure. Left Atrium: Left atrial size was normal in size. Right Atrium: Right atrial size was normal in size. Pericardium: There is no evidence of pericardial effusion. Mitral Valve: The mitral valve is normal in structure. No evidence of mitral valve regurgitation. No evidence of mitral valve stenosis. MV peak gradient, 4.5 mmHg. The  mean mitral valve gradient is 2.0 mmHg. Tricuspid Valve: The tricuspid valve is normal in structure. Tricuspid valve regurgitation is not demonstrated. No evidence of tricuspid stenosis. Aortic Valve: The aortic valve is normal in structure. Aortic valve regurgitation is not visualized. No aortic stenosis is present. Aortic valve mean gradient measures 4.0 mmHg. Aortic valve peak gradient measures 6.8 mmHg. Pulmonic Valve: The pulmonic valve was not well visualized. Pulmonic valve regurgitation is not visualized. No evidence of pulmonic stenosis. Aorta: The aortic root is normal in size and structure. Venous: The inferior vena cava is normal in size with greater than 50% respiratory variability, suggesting right atrial pressure of 3 mmHg. IAS/Shunts: No atrial level shunt detected by color flow Doppler.   LV Volumes (MOD) LV vol d, MOD A4C: 99.4 ml Diastology LV vol s, MOD A4C: 58.3 ml LV e' lateral:   10.30 cm/s LV SV MOD A4C:     99.4 ml LV E/e' lateral: 5.8  LEFT ATRIUM           Index LA Vol (A4C): 41.5 ml 20.86 ml/m  AORTIC VALVE AV Vmax:           130.00 cm/s AV Vmean:          93.400 cm/s AV VTI:            0.243 m AV Peak Grad:      6.8 mmHg AV Mean Grad:      4.0 mmHg LVOT Vmax:         92.70 cm/s LVOT Vmean:        60.500 cm/s LVOT VTI:          0.151 m LVOT/AV VTI ratio: 0.62 MITRAL VALVE MV Area (PHT): 6.96 cm    SHUNTS MV Peak grad:  4.5 mmHg    Systemic VTI: 0.15 m MV Mean grad:  2.0 mmHg MV Vmax:       1.06 m/s MV Vmean:      67.9 cm/s MV Decel Time: 109 msec MV E velocity: 59.60 cm/s MV A velocity: 68.60 cm/s MV E/A ratio:  0.87 Kathlyn Sacramento MD Electronically signed by Kathlyn Sacramento MD Signature Date/Time: 10/10/2020/2:00:06 PM    Final     Assessment and plan-   # #Acute respiratory failure with hypoxia To finish Augmentin course. Prednisone tapering.  And IV Lasix 40 mg daily. Pulmonology following.  #Metastatic triple negative breast cancer, patient has high tumor burden, progressed  on first-line palliative chemotherapy with Taxol.  There are other treatment options available, i.e. Adriamycin +/- Cytoxan, Sacituzumab govitecan.  Immunotherapy is less likely beneficial given her CPS <1. visceral crisis/obstructive pneumonia.  Status post 1 dose of Adriamycin 60mg /m2, day 6  Patient asked about her future treatment plans. Patient will follow up with me next week for evaluation of performance status and breathing status, further treatment decision depending on  how she does in the next few days.  #History of QTc prolongation, OTc corrected on recent EKG. Keep potassium above 4, and Mag >2.  Continue daily oral potassium 73meq.   #Nausea,  Symptom has improved, continue scopolamine patch.  Zofran 4 mg every 8 hours as needed for breakthrough nausea/vomiting. #Poor appetite, continue Marinol. #Malnutrition, continue nutritional supplementation.  #Disposition, hopefully home with oxygen in a few days, home care.  Physical therapy evaluation. Thank you for allowing me to participate in the care of this patient. Code status DNR  #PT evaluation Earlie Server, MD, PhD Hematology Oncology [facility] at [facility] Pager- [phone] 10/19/2020

## 2020-10-19 NOTE — Consult Note (Signed)
PHARMACY CONSULT NOTE  Pharmacy Consult for Electrolyte Monitoring and Replacement   Recent Labs: Potassium (mmol/L)  Date Value  10/19/2020 4.3   Magnesium (mg/dL)  Date Value  10/18/2020 2.6 (H)   Calcium (mg/dL)  Date Value  10/19/2020 8.4 (L)   Albumin (g/dL)  Date Value  10/15/2020 2.6 (L)   Phosphorus (mg/dL)  Date Value  10/18/2020 2.8   Sodium (mmol/L)  Date Value  10/19/2020 142   Assessment: Patient is a 55 y/o F with medical history including metastatic breast cancer on chemotherapy, tachycardia, anemia, GERD, malignant pleural effusion, hemothorax who was admitted 2/25 with respiratory failure secondary to pneumonia versus worsening metastatic disease. Pharmacy consulted to assist with electrolyte monitoring and replacement as indicated.   Per chart review, appetite appears poor.   Diuresis: IV Lasix   3/9 labs:  Na: 142>142 (stable) K: 4.5>4.3 (stable) Mg: 2.6>NNL (check QoD) Phos: 2.8>NNL  (check QoD) Ca: 8.4 (albumin: 2.6) -- corrected 9.1 (stable) Scr 0.45>0.49  Goal of Therapy:  Electrolytes within normal limits  Plan:  - Continues on lasix 40 IV BID, Mag PO 64mg  daily, Calcium daily,  KCL 20 meq PO daily -no electrolyte replacement needed at this time.  --Will follow-up electrolytes with AM labs. f/u Mag/phos every other day as has been stable  Lorna Dibble 10/19/2020 7:22 AM

## 2020-10-19 NOTE — Progress Notes (Signed)
PROGRESS NOTE    Jessica Willis  NKN:[id number] DOB: July 28, 1966 DOA: 10/07/2020 PCP: [facility]   Brief Narrative: Taken from prior notes. 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  Admitted for acute hypoxic respiratory failure, sepsis thought secondary to pneumonia complicated by metastatic breast cancer. Seen by pulmonology, ID, oncology and palliative care.  Treated with antibiotics.  Plan is to continue aggressive treatment, started chemotherapy 3/4.  Major issue is acute hypoxic respiratory failure requiring 5-6 L nasal cannula. Patient mostly refusing to work with PT, plan is to go home with home health and oxygen in a day.  Subjective: Patient continued to feel weak and tired.  Continues to refuse to work with PT.  Advised patient to get out of bed and ambulate so we can determine her oxygen requirement to go home.  Assessment & Plan:   Principal Problem:   HCAP (healthcare-associated pneumonia) Active Problems:   Sepsis ([place])   Acute respiratory failure with hypoxia ([place])   Metastatic breast cancer (HCC)   Tachycardia   Iron deficiency anemia   Chemotherapy-induced nausea   Poor appetite  Acute hypoxic respiratory failure secondary to pneumonia versus metastatic disease/lymphangitic spread of cancer progression.  Sepsis considered on admission.  Extensive airspace abnormalities of the right lung and loculated pleural effusion, infectious versus malignant.  Continue to require 5 to 6 L of oxygen. -Continue Augmentin for total of 7 days, day 6 today. -Continue to wean steroid-by pulmonology -Continue with Lasix and albumin per pulmonology. -Ambulate with pulse ox to determine home oxygen need.  Stage IV metastatic fungating breast cancer with large necrotizing mass, high tumor burden, progressed on weekly Taxol last dose 09/15/2020 --Started  chemotherapy 3/4.  Follow-up with oncology as an outpatient.  Fungating right breast mass with drainage --Continue wound care, goal directed towards observing drainage and decreasing discomfort.  Iron deficiency anemia  --Stable.  Chronic tachycardia --Continue beta-blocker  Goals of care --disease not curable, refractory to first-line chemotherapy, aggressive in nature.  Palliative care following.  Guarded prognosis.  Objective: Vitals:   10/18/20 2233 10/19/20 0014 10/19/20 0325 10/19/20 0818  BP: 108/60 108/67 99/60 106/70  Pulse: 100 (!) 103 97 99  Resp:  20 18 16   Temp:  (!) 97.1 F (36.2 C) 98 F (36.7 C) 98.1 F (36.7 C)  TempSrc:    Oral  SpO2:  99% 99% 96%  Weight:      Height:        Intake/Output Summary (Last 24 hours) at 10/19/2020 1423 Last data filed at 10/19/2020 1009 Gross per 24 hour  Intake 240 ml  Output 1000 ml  Net -760 ml   Filed Weights   10/14/20 0349 10/15/20 0405 10/16/20 0100  Weight: 78.9 kg 79.1 kg 81.2 kg    Examination:  General exam: Appears calm and comfortable  Respiratory system: Decreased breath sound at right base, respiratory effort normal. Cardiovascular system: Sinus tachycardia Gastrointestinal system: Soft, nontender, nondistended, bowel sounds positive. Central nervous system: Alert and oriented. No focal neurological deficits.Symmetric 5 x 5 power. Extremities: No edema, no cyanosis, pulses intact and symmetrical. Psychiatry: Judgement and insight appear normal.   DVT prophylaxis: Lovenox Code Status: DNR Family Communication: Discussed with patient Disposition Plan:  Status is: Inpatient  Remains inpatient appropriate because:Inpatient level of care appropriate due to severity of illness   Dispo: The patient is from: Home  Anticipated d/c is to: Home              Patient currently is not medically stable to d/c.   Difficult to place patient No              Level of care: Med-Surg  All the  records are reviewed and case discussed with Care Management/Social Worker. Management plans discussed with the patient, nursing and they are in agreement.  Consultants:   Oncology  ID  Pulmonology  Palliative care  Procedures:  Antimicrobials:  Augmentin  Data Reviewed: I have personally reviewed following labs and imaging studies  CBC: Recent Labs  Lab 10/14/20 0517 10/16/20 1642 10/18/20 0647  WBC 22.3* 20.0* 20.4*  NEUTROABS 18.5* 18.1* 19.4*  HGB 9.2* 8.9* 9.1*  HCT 31.9* 31.3* 30.9*  MCV 85.8 86.5 85.1  PLT 364 354 244   Basic Metabolic Panel: Recent Labs  Lab 10/13/20 0647 10/14/20 0517 10/15/20 0608 10/16/20 0424 10/17/20 0834 10/18/20 0647 10/19/20 0455  NA 142   < > 143 142 142 142 142  K 4.0   < > 4.3 4.0 4.5 4.5 4.3  CL 101   < > 99 98 98 97* 98  CO2 31   < > 32 34* 34* 35* 34*  GLUCOSE 109*   < > 128* 109* 104* 94 98  BUN 21*   < > 22* 26* 26* 23* 24*  CREATININE 0.39*   < > 0.42* 0.33* 0.36* 0.45 0.49  CALCIUM 8.1*   < > 8.5* 8.5* 8.3* 8.3* 8.4*  MG  --   --  2.5*  --   --  2.6*  --   PHOS 3.2  --  2.8  --   --  2.8  --    < > = values in this interval not displayed.   GFR: Estimated Creatinine Clearance: 85.9 mL/min (by C-G formula based on SCr of 0.49 mg/dL). Liver Function Tests: Recent Labs  Lab 10/14/20 0517 10/15/20 0608  AST 55*  --   ALT 16  --   ALKPHOS 157*  --   BILITOT 0.4  --   PROT 6.1*  --   ALBUMIN 2.3* 2.6*   No results for input(s): LIPASE, AMYLASE in the last 168 hours. No results for input(s): AMMONIA in the last 168 hours. Coagulation Profile: No results for input(s): INR, PROTIME in the last 168 hours. Cardiac Enzymes: No results for input(s): CKTOTAL, CKMB, CKMBINDEX, TROPONINI in the last 168 hours. BNP (last 3 results) No results for input(s): PROBNP in the last 8760 hours. HbA1C: No results for input(s): HGBA1C in the last 72 hours. CBG: No results for input(s): GLUCAP in the last 168 hours. Lipid  Profile: No results for input(s): CHOL, HDL, LDLCALC, TRIG, CHOLHDL, LDLDIRECT in the last 72 hours. Thyroid Function Tests: No results for input(s): TSH, T4TOTAL, FREET4, T3FREE, THYROIDAB in the last 72 hours. Anemia Panel: No results for input(s): VITAMINB12, FOLATE, FERRITIN, TIBC, IRON, RETICCTPCT in the last 72 hours. Sepsis Labs: No results for input(s): PROCALCITON, LATICACIDVEN in the last 168 hours.  No results found for this or any previous visit (from the past 240 hour(s)).   Radiology Studies: No results found.  Scheduled Meds: . amoxicillin-clavulanate  1 tablet Oral Q12H  . calcium carbonate  1,250 mg Oral Q breakfast  . Chlorhexidine Gluconate Cloth  6 each Topical Daily  . senna  2 tablet Oral BID   And  . docusate sodium  100 mg Oral BID  .  dronabinol  5 mg Oral BID AC  . enoxaparin (LOVENOX) injection  40 mg Subcutaneous Q24H  . feeding supplement  237 mL Oral TID BM  . ferrous sulfate  325 mg Oral BID WC  . furosemide  40 mg Intravenous BID  . magnesium chloride  1 tablet Oral Daily  . melatonin  5 mg Oral QHS  . methylPREDNISolone (SOLU-MEDROL) injection  20 mg Intravenous Q12H  . metoprolol tartrate  50 mg Oral BID  . multivitamin with minerals  1 tablet Oral Daily  . polyethylene glycol  17 g Oral BID  . potassium chloride  20 mEq Oral Daily  . scopolamine  1 patch Transdermal Q72H  . sodium chloride flush  10-40 mL Intracatheter Q12H   Continuous Infusions: . albumin human 12.5 g (10/19/20 0921)     LOS: 12 days   Time spent: 40 minutes. More than 50% of the time was spent in counseling/coordination of care  Lorella Nimrod, MD [facility]  If 7PM-7AM, please contact night-coverage [email or web address]  10/19/2020, 2:23 PM   This record has been created using Systems analyst. Errors have been sought and corrected,but may not always be located. Such creation errors do not reflect on the standard of care.

## 2020-10-19 NOTE — Progress Notes (Signed)
PT Cancellation Note  Patient Details Name: Jessica Willis MRN: [id number] DOB: 1966/08/11   Cancelled Treatment:    Reason Eval/Treat Not Completed: Patient declined, no reason specified (Evaluation re-attempted. Patient continues to decline participation with session, "I don't feel up to it. I had a rough night".  Have attempted x6 consecutive days with persistent refusals from patient.  Will complete order at this time; please re-consult if needed as patient medically optimized and able to tolerate.)   [name] H. Owens Shark, PT, DPT, NCS 10/19/20, 11:11 AM [phone]

## 2020-10-20 DIAGNOSIS — J189 Pneumonia, unspecified organism: Secondary | ICD-10-CM | POA: Diagnosis not present

## 2020-10-20 LAB — PHOSPHORUS: Phosphorus: 3.4 mg/dL (ref 2.5–4.6)

## 2020-10-20 LAB — BASIC METABOLIC PANEL
Anion gap: 11 (ref 5–15)
BUN: 26 mg/dL — ABNORMAL HIGH (ref 6–20)
CO2: 34 mmol/L — ABNORMAL HIGH (ref 22–32)
Calcium: 8.4 mg/dL — ABNORMAL LOW (ref 8.9–10.3)
Chloride: 99 mmol/L (ref 98–111)
Creatinine, Ser: 0.37 mg/dL — ABNORMAL LOW (ref 0.44–1.00)
GFR, Estimated: >60 mL/min (ref >60)
Glucose, Bld: 96 mg/dL (ref 70–99)
Potassium: 3.9 mmol/L (ref 3.5–5.1)
Sodium: 144 mmol/L (ref 135–145)

## 2020-10-20 LAB — CBC
HCT: 29.7 % — ABNORMAL LOW (ref 36.0–46.0)
Hemoglobin: 8.7 g/dL — ABNORMAL LOW (ref 12.0–15.0)
MCH: 25.4 pg — ABNORMAL LOW (ref 26.0–34.0)
MCHC: 29.3 g/dL — ABNORMAL LOW (ref 30.0–36.0)
MCV: 86.6 fL (ref 80.0–100.0)
Platelets: 319 10*3/uL (ref 150–400)
RBC: 3.43 MIL/uL — ABNORMAL LOW (ref 3.87–5.11)
RDW: 16.1 % — ABNORMAL HIGH (ref 11.5–15.5)
WBC: 18.8 10*3/uL — ABNORMAL HIGH (ref 4.0–10.5)
nRBC: 0 % (ref 0.0–0.2)

## 2020-10-20 LAB — MAGNESIUM: Magnesium: 2.3 mg/dL (ref 1.7–2.4)

## 2020-10-20 MED ORDER — FLUCONAZOLE 100 MG PO TABS
200.0000 mg | ORAL_TABLET | Freq: Once | ORAL | Status: AC
  Administered 2020-10-20: 200 mg via ORAL
  Filled 2020-10-20: qty 2

## 2020-10-20 MED ORDER — FLUCONAZOLE 100 MG PO TABS
100.0000 mg | ORAL_TABLET | Freq: Every day | ORAL | Status: DC
  Administered 2020-10-21: 100 mg via ORAL
  Filled 2020-10-20: qty 1

## 2020-10-20 NOTE — Progress Notes (Signed)
PROGRESS NOTE    Jessica Willis  GEX:[id number] DOB: 1965-09-07 DOA: 10/07/2020 PCP: [place]   Brief Narrative: Taken from prior notes. 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  Admitted for acute hypoxic respiratory failure, sepsis thought secondary to pneumonia complicated by metastatic breast cancer. Seen by pulmonology, ID, oncology and palliative care.  Treated with antibiotics.  Plan is to continue aggressive treatment, started chemotherapy 3/4.  Major issue is acute hypoxic respiratory failure requiring 3-4 L nasal cannula. Patient mostly refusing to work with PT, plan is to go home with home health and oxygen in a day.  Home oxygen ordered.  Subjective: Patient was seen and examined.  Having some diarrhea, 1 loose bowel movement since morning, 2 yesterday.  No abdominal pain. Discussed about discharge, patient wants to stay 1 more day stating that there is no one at home today.  Assessment & Plan:   Principal Problem:   Pneumonia due to infectious organism Active Problems:   Sepsis ([place])   Acute respiratory failure with hypoxia ([place])   Metastatic breast cancer (HCC)   Tachycardia   Iron deficiency anemia   Chemotherapy-induced nausea   Poor appetite  Acute hypoxic respiratory failure secondary to pneumonia versus metastatic disease/lymphangitic spread of cancer progression.  Sepsis considered on admission.  Extensive airspace abnormalities of the right lung and loculated pleural effusion, infectious versus malignant.  Oxygen requirement improved to 3 L. -We will complete the course of Augmentin today. -Continue to wean steroid-by pulmonology, pulmonology is recommending weaning 5 mg every other day. -Continue with Lasix and albumin per pulmonology. -Ambulate with pulse ox to determine home oxygen need.  Stage IV metastatic fungating breast  cancer with large necrotizing mass, high tumor burden, progressed on weekly Taxol last dose 09/15/2020 --Started chemotherapy 3/4.  Follow-up with oncology as an outpatient.  Diarrhea.  Patient remained afebrile, improving leukocytosis and no abdominal pain so less likely C. difficile although she is high risk. She was having some constipation before and was given bowel regimen couple of days ago. -Monitor at this time.  Fungating right breast mass with drainage --Continue wound care, goal directed towards observing drainage and decreasing discomfort.  Iron deficiency anemia  --Stable.  Chronic tachycardia --Continue beta-blocker  Goals of care --disease not curable, refractory to first-line chemotherapy, aggressive in nature.  Palliative care following.  Guarded prognosis.  Objective: Vitals:   10/20/20 0024 10/20/20 0439 10/20/20 0800 10/20/20 1137  BP: 111/63 (!) 105/59 117/68 (!) 111/54  Pulse: (!) 103 (!) 101 (!) 109 (!) 110  Resp: 17 20 20 19   Temp: (!) 97.5 F (36.4 C) 97.8 F (36.6 C) 98.9 F (37.2 C) 98.5 F (36.9 C)  TempSrc: Oral Oral  Oral  SpO2: 96% 100% 99% 93%  Weight:      Height:        Intake/Output Summary (Last 24 hours) at 10/20/2020 1417 Last data filed at 10/20/2020 1030 Gross per 24 hour  Intake 0 ml  Output 1200 ml  Net -1200 ml   Filed Weights   10/14/20 0349 10/15/20 0405 10/16/20 0100  Weight: 78.9 kg 79.1 kg 81.2 kg    Examination:  General.  Chronically ill-appearing lady, in no acute distress. Pulmonary.  Lungs clear bilaterally, normal respiratory effort. CV.  Regular rate and rhythm, no JVD, rub or murmur. Abdomen.  Soft, nontender, nondistended, BS positive. CNS.  Alert and oriented x3.  No focal neurologic  deficit. Extremities.  No edema, no cyanosis, pulses intact and symmetrical. Psychiatry.  Judgment and insight appears normal.  DVT prophylaxis: Lovenox Code Status: DNR Family Communication: Discussed with  patient Disposition Plan:  Status is: Inpatient  Remains inpatient appropriate because:Inpatient level of care appropriate due to severity of illness   Dispo: The patient is from: Home              Anticipated d/c is to: Home              Patient currently is not medically stable to d/c.   Difficult to place patient No              Level of care: Med-Surg  All the records are reviewed and case discussed with Care Management/Social Worker. Management plans discussed with the patient, nursing and they are in agreement.  Consultants:   Oncology  ID  Pulmonology  Palliative care  Procedures:  Antimicrobials:  Augmentin  Data Reviewed: I have personally reviewed following labs and imaging studies  CBC: Recent Labs  Lab 10/14/20 0517 10/16/20 1642 10/18/20 0647 10/20/20 0615  WBC 22.3* 20.0* 20.4* 18.8*  NEUTROABS 18.5* 18.1* 19.4*  --   HGB 9.2* 8.9* 9.1* 8.7*  HCT 31.9* 31.3* 30.9* 29.7*  MCV 85.8 86.5 85.1 86.6  PLT 364 354 351 563   Basic Metabolic Panel: Recent Labs  Lab 10/15/20 0608 10/16/20 0424 10/17/20 0834 10/18/20 0647 10/19/20 0455 10/20/20 0615  NA 143 142 142 142 142 144  K 4.3 4.0 4.5 4.5 4.3 3.9  CL 99 98 98 97* 98 99  CO2 32 34* 34* 35* 34* 34*  GLUCOSE 128* 109* 104* 94 98 96  BUN 22* 26* 26* 23* 24* 26*  CREATININE 0.42* 0.33* 0.36* 0.45 0.49 0.37*  CALCIUM 8.5* 8.5* 8.3* 8.3* 8.4* 8.4*  MG 2.5*  --   --  2.6*  --  2.3  PHOS 2.8  --   --  2.8  --  3.4   GFR: Estimated Creatinine Clearance: 85.9 mL/min (A) (by C-G formula based on SCr of 0.37 mg/dL (L)). Liver Function Tests: Recent Labs  Lab 10/14/20 0517 10/15/20 0608  AST 55*  --   ALT 16  --   ALKPHOS 157*  --   BILITOT 0.4  --   PROT 6.1*  --   ALBUMIN 2.3* 2.6*   No results for input(s): LIPASE, AMYLASE in the last 168 hours. No results for input(s): AMMONIA in the last 168 hours. Coagulation Profile: No results for input(s): INR, PROTIME in the last 168  hours. Cardiac Enzymes: No results for input(s): CKTOTAL, CKMB, CKMBINDEX, TROPONINI in the last 168 hours. BNP (last 3 results) No results for input(s): PROBNP in the last 8760 hours. HbA1C: No results for input(s): HGBA1C in the last 72 hours. CBG: No results for input(s): GLUCAP in the last 168 hours. Lipid Profile: No results for input(s): CHOL, HDL, LDLCALC, TRIG, CHOLHDL, LDLDIRECT in the last 72 hours. Thyroid Function Tests: No results for input(s): TSH, T4TOTAL, FREET4, T3FREE, THYROIDAB in the last 72 hours. Anemia Panel: No results for input(s): VITAMINB12, FOLATE, FERRITIN, TIBC, IRON, RETICCTPCT in the last 72 hours. Sepsis Labs: No results for input(s): PROCALCITON, LATICACIDVEN in the last 168 hours.  No results found for this or any previous visit (from the past 240 hour(s)).   Radiology Studies: No results found.  Scheduled Meds: . calcium carbonate  1,250 mg Oral Q breakfast  . Chlorhexidine Gluconate Cloth  6 each  Topical Daily  . senna  2 tablet Oral BID   And  . docusate sodium  100 mg Oral BID  . dronabinol  5 mg Oral BID AC  . enoxaparin (LOVENOX) injection  40 mg Subcutaneous Q24H  . feeding supplement  237 mL Oral QID  . ferrous sulfate  325 mg Oral BID WC  . [START ON 10/21/2020] fluconazole  100 mg Oral Daily  . furosemide  40 mg Intravenous Daily  . magic mouthwash w/lidocaine  15 mL Oral TID  . magnesium chloride  1 tablet Oral Daily  . melatonin  5 mg Oral QHS  . metoprolol tartrate  50 mg Oral BID  . multivitamin with minerals  1 tablet Oral Daily  . polyethylene glycol  17 g Oral BID  . potassium chloride  20 mEq Oral Daily  . predniSONE  50 mg Oral Q breakfast  . scopolamine  1 patch Transdermal Q72H  . sodium chloride flush  10-40 mL Intracatheter Q12H   Continuous Infusions: . albumin human 12.5 g (10/20/20 1016)     LOS: 13 days   Time spent: 30 minutes. More than 50% of the time was spent in counseling/coordination of  care  Lorella Nimrod, MD [facility]  If 7PM-7AM, please contact night-coverage [email or web address]  10/20/2020, 2:17 PM   This record has been created using Systems analyst. Errors have been sought and corrected,but may not always be located. Such creation errors do not reflect on the standard of care.

## 2020-10-20 NOTE — Plan of Care (Signed)

## 2020-10-20 NOTE — Progress Notes (Signed)
Occupational Therapy Treatment Patient Details Name: Jessica Willis MRN: [id number] DOB: 18-Jul-1966 Today's Date: 10/20/2020    History of present illness Jessica Willis is a 55 y.o. female with medical history significant of stage IV metastasized breast cancer on chemotherapy, tachycardia, GERD, anemia, malignant pleural effusion, hemothorax, who presented to hosptial ED on 10/07/2020 with shortness of breath. PE ruled out.  Chest CT scan showed pneumonia of the right middle lobe and lower lobe. Admitted for acute hypoxic respiratory failure, sepsis thought secondary to pneumonia complicated by metastatic breast cancer. Seen by pulmonology, ID, oncology and PMT. Plan is to continue aggressive treatment, started chemotherapy 3/4. Has fungating right breast mass with drainage. Disease not curable, refractory to first-line chemotherapy, aggressive in nature.  Palliative care following.  Guarded prognosis.   OT comments  [name] was seen for OT treatment on this date. Upon arrival to room pt reclined in bed with son at bedside. Pt reports fatigue and requires extensive education and motivation to participate in session. Pt reports she continues to have loose BMs and agreeable to [facility] trial. MIN A don/doff B socks seated EOB. MIN A + RW for BSC t/f. MAX A perihygiene in standing. Initial CGA sit<>stand from elevated surface, decreasing to MIN A + RW sit<>stand from low BSC surface. SpO2 97% t/o mobility on 4L [place]. Pt making good progress toward goals. Pt continues to benefit from skilled OT services to maximize return to PLOF and minimize risk of future falls, injury, caregiver burden, and readmission. Will continue to follow POC. Discharge recommendation remains appropriate.    Follow Up Recommendations  Home health OT;Supervision/Assistance - 24 hour    Equipment Recommendations  None recommended by OT    Recommendations for Other Services      Precautions / Restrictions  Precautions Precautions: Fall Restrictions Weight Bearing Restrictions: No       Mobility Bed Mobility Overal bed mobility: Needs Assistance Bed Mobility: Supine to Sit;Sit to Supine     Supine to sit: Supervision Sit to supine: Supervision        Transfers Overall transfer level: Needs assistance Equipment used: Rolling walker (2 wheeled) Transfers: Sit to/from [facility] Sit to Stand: Min assist Stand pivot transfers: Min guard       General transfer comment: CGA sit<>stand from elevated surface, MIN A + RW sit<>stand from low BSC surface    Balance Overall balance assessment: Needs assistance Sitting-balance support: No upper extremity supported;Feet supported Sitting balance-Leahy Scale: Fair     Standing balance support: Single extremity supported Standing balance-Leahy Scale: Fair                             ADL either performed or assessed with clinical judgement   ADL Overall ADL's : Needs assistance/impaired                                       General ADL Comments: MIN A don/doff B socks seated EOB. MIN A for BSC t/f. MAX A perihygiene in standing.               Cognition Arousal/Alertness: Awake/alert Behavior During Therapy: WFL for tasks assessed/performed;Flat affect Overall Cognitive Status: Within Functional Limits for tasks assessed  Exercises Exercises: Other exercises Other Exercises Other Exercises: Pt educated re: OT role, DME recs, d/c recs, falls prevention, ECS, home/rotuines modifications, extensive education on importance of mobility for funcitonal strengthening Other Exercises: LBD, toileting, UBD, sup<>sit, sit<>stand, sitting/standing balance/tolerance      General Comments SpO2 97% during BSC t/f on 4L [place]    Pertinent Vitals/ Pain       Pain Assessment: No/denies pain         Frequency  Min 1X/week         Progress Toward Goals  OT Goals(current goals can now be found in the care plan section)  Progress towards OT goals: Progressing toward goals  Acute Rehab OT Goals Patient Stated Goal: To return home OT Goal Formulation: With patient Time For Goal Achievement: 10/31/20 Potential to Achieve Goals: Good ADL Goals Pt Will Perform Grooming: standing;with modified independence Pt Will Transfer to Toilet: with modified independence;ambulating;bedside commode Additional ADL Goal #1: Pt will Independenly verbalize plan to implement x3 ECS  Plan Discharge plan remains appropriate;Frequency remains appropriate       AM-PAC OT "6 Clicks" Daily Activity     Outcome Measure   Help from another person eating meals?: None Help from another person taking care of personal grooming?: None Help from another person toileting, which includes using toliet, bedpan, or urinal?: A Lot Help from another person bathing (including washing, rinsing, drying)?: A Lot Help from another person to put on and taking off regular upper body clothing?: A Little Help from another person to put on and taking off regular lower body clothing?: A Little 6 Click Score: 18    End of Session Equipment Utilized During Treatment: Oxygen  OT Visit Diagnosis: Unsteadiness on feet (R26.81);Repeated falls (R29.6);Muscle weakness (generalized) (M62.81)   Activity Tolerance Patient tolerated treatment well   Patient Left in bed;with call bell/phone within reach;with bed alarm set   Nurse Communication Mobility status        Time: 1572-6203 OT Time Calculation (min): 39 min  Charges: OT General Charges $OT Visit: 1 Visit OT Treatments $Self Care/Home Management : 38-52 mins  Dessie Coma, M.S. OTR/L  10/20/20, 3:33 PM  [email or web address] [phone]

## 2020-10-20 NOTE — Progress Notes (Signed)
Pulmonary Medicine          Date: 10/20/2020,   MRN# [id number] Jessica Willis 1966/07/12     AdmissionWeight: 77.6 kg                 CurrentWeight: 81.2 kg   Referring physician: Dr Tasia Catchings   CHIEF COMPLAINT:      HISTORY OF PRESENT ILLNESS   Jessica Willis is a 55 y.o. female with medical history significant of stage IV metastasized breast cancer on chemotherapy, tachycardia, GERD, anemia, malignant pleural effusion, hemothorax, who presents with shortness of breath. In ER initally presentedwith cough and sob. She had massive rt pleural effusion with collapse of rt lung . She also had rt breast mass with changes in skin color for  6 months . She underwent thoracenteiss and it was metastatic adenocarcinoma. MRI of the brain showed scattered metastatic implants upper cervical spine.  She had a port placement and also underwent incisional biopsy of the rt breast mass on 06/28/20.  She got a dose of taxol while inpatient and was discharged on 07/01/20. She followed up with oncologist as OP, on palliative chemotherapy with weekly taxol.  Has been having tachycardia and SOB- saw Cardiology on 09/26/20 Prolonged QT and tachycardia. Pt was found to have WBC 22.3, negative Covid PCR, lactic acid 2.3, INR 1.3, PTT 36, electrolytes renal function okay, temperature 99.5, blood pressure 138/80, heart rate 134, RR 20, oxygen saturation 87% on room air, which improved her to 100% on 2 L oxygen. PCCM consultation placed due to complicated respiratory status with metastatic lung cancer and large malignant effusion. CT chest below shows pleural effusion bilaterally worse on right with metastatic lesions visible on bony spine from thoracic spine down and significant hilar and mediastinal lymphadneopathy suggestive of widespread mets.   10/13/20- patient reports improved breathing, weaned O2 to 4L [place].  She is diuresing well which ishelping her breathing.  She responded well to steroids,  inflammatory markers are elevated likely due to active malignancy and CAP(although most of this is likely fluid and unclear how much is actual infection).  She feels strong enough to get up and participate with PT.  She is using IS and able to take tidal volume 500cc today.  I have encouraged to use every hour. I met with son and reviewed care plan.   10/14/20-  Patient had onc tx today. She felt onset of SOB few hours after coming back however vitals stable.   10/15/20- patient had incresed O2 req this am to 5L/min.  I will repeat CXR today.  She was able to eat this am, states overnight she felt worsening SOB post therapy yesterday. Shes making adequate urine overnight, will keep lasix at 40.  Will dc albumin as this can sometimes cause pulm edema and will keep low dose bid solumedrol 20 for now.   10/16/20-  Patient weaned to 4L/min [place]. Reviewed CXR with improved left side still significant airspace and interstitial opacification on right. She had documented acute episode of resp distress with spO2 74%   10/17/20- patient resting in bed in no distress. She is on 4-5L/mn [place].  Lasix 40 BID with albumin infusing but patient remains with plateau in terms of improvement over past 24-48h.  Continue MEtaneb. PT/OT   10/18/20- despite >2L diuresis overnight patient actually seems to have increased O2 req hower spO2 100% she may be able towean more.  She reports improvement and has no pain or muscle cramping.  She tolerates  BID lasix well with normal renal function today. Plan to continue current scope of therapy from pulm perspective and will consider repeat CT chest for interval changes.    10/19/20- patient is down to 3L/min.  Oral mucosa is dry and there is oral thrush. Will start Diflucan today and magic mouth wash.   10/20/20- patient on 3L again today.  [name] is improving. Her urine is green with sedimentation. Will obtain UA and urine sediments.  PAST MEDICAL HISTORY   Past Medical History:  Diagnosis Date   . Anemia   . Family history of breast cancer   . Patient denies medical problems      SURGICAL HISTORY   Past Surgical History:  Procedure Laterality Date  . BREAST BIOPSY  06/28/2020   Procedure: BREAST BIOPSY;  Surgeon: Benjamine Sprague, DO;  Location: [facility] ORS;  Service: General;;  . PORTACATH PLACEMENT N/A 06/28/2020   Procedure: INSERTION PORT-A-CATH;  Surgeon: Benjamine Sprague, DO;  Location: [facility] ORS;  Service: General;  Laterality: N/A;  . TUBAL LIGATION    . VIDEO ASSISTED THORACOSCOPY (VATS)/THOROCOTOMY Right 06/23/2020   Procedure: ATTEMPTED VIDEO ASSISTED THORACOSCOPY (VATS);  Surgeon: Nestor Lewandowsky, MD;  Location: [facility] ORS;  Service: General;  Laterality: Right;     FAMILY HISTORY   Family History  Problem Relation Age of Onset  . Diabetes Other   . Hypertension Other   . Diabetes Maternal Aunt   . Breast cancer Cousin        dx 39s  . Breast cancer Cousin        dx 35s     SOCIAL HISTORY   Social History   Tobacco Use  . Smoking status: Never Smoker  . Smokeless tobacco: Never Used  Vaping Use  . Vaping Use: Never used  Substance Use Topics  . Alcohol use: Not Currently    Alcohol/week: 1.0 standard drink    Types: 1 Cans of beer per week  . Drug use: No     MEDICATIONS    Home Medication:    Current Medication:  Current Facility-Administered Medications:  .  acetaminophen (TYLENOL) tablet 650 mg, 650 mg, Oral, Q6H PRN, Ivor Costa, MD, 650 mg at 10/09/20 2144 .  albumin human 25 % solution 12.5 g, 12.5 g, Intravenous, Daily, Lanney Gins, Nurah Petrides, MD, Last Rate: 60 mL/hr at 10/19/20 0921, 12.5 g at 10/19/20 0921 .  albuterol (PROVENTIL) (2.5 MG/3ML) 0.083% nebulizer solution 2.5 mg, 2.5 mg, Nebulization, Q6H PRN, Samuella Cota, MD, 2.5 mg at 10/16/20 1612 .  albuterol (VENTOLIN HFA) 108 (90 Base) MCG/ACT inhaler 2 puff, 2 puff, Inhalation, Q4H PRN, Ivor Costa, MD .  bisacodyl (DULCOLAX) suppository 10 mg, 10 mg, Rectal, Daily PRN, Samuella Cota, MD .  calcium carbonate (OS-CAL - dosed in mg of elemental calcium) tablet 1,250 mg, 1,250 mg, Oral, Q breakfast, Ivor Costa, MD, 1,250 mg at 10/19/20 0907 .  Chlorhexidine Gluconate Cloth 2 % PADS 6 each, 6 each, Topical, Daily, Sharen Hones, MD, 6 each at 10/19/20 [phone]  dextromethorphan-guaiFENesin (MUCINEX DM) 30-600 MG per 12 hr tablet 1 tablet, 1 tablet, Oral, BID PRN, Ivor Costa, MD, 1 tablet at 10/19/20 0907 .  senna (SENOKOT) tablet 17.2 mg, 2 tablet, Oral, BID, 17.2 mg at 10/18/20 1026 **AND** docusate sodium (COLACE) capsule 100 mg, 100 mg, Oral, BID, Samuella Cota, MD, 100 mg at 10/18/20 1026 .  dronabinol (MARINOL) capsule 5 mg, 5 mg, Oral, BID AC, Earlie Server, MD, 5 mg at 10/19/20 1601 .  enoxaparin (LOVENOX) injection 40 mg, 40 mg, Subcutaneous, Q24H, Ivor Costa, MD, 40 mg at 10/19/20 2230 .  feeding supplement (ENSURE ENLIVE / ENSURE PLUS) liquid 237 mL, 237 mL, Oral, QID, Lorella Nimrod, MD, 237 mL at 10/19/20 1731 .  ferrous sulfate tablet 325 mg, 325 mg, Oral, BID WC, Ivor Costa, MD, 325 mg at 10/19/20 1600 .  furosemide (LASIX) injection 40 mg, 40 mg, Intravenous, Daily, Lanney Gins, Waleska Buttery, MD .  lidocaine-prilocaine (EMLA) cream 1 application, 1 application, Topical, PRN, Ivor Costa, MD .  magic mouthwash w/lidocaine, 15 mL, Oral, TID, Lorella Nimrod, MD, 15 mL at 10/19/20 2234 .  magnesium chloride (SLOW-MAG) 64 MG SR tablet 64 mg, 1 tablet, Oral, Daily, Ivor Costa, MD, 64 mg at 10/19/20 0907 .  melatonin tablet 5 mg, 5 mg, Oral, QHS, Sharen Hones, MD, 5 mg at 10/19/20 2230 .  metoprolol tartrate (LOPRESSOR) tablet 50 mg, 50 mg, Oral, BID, Ivor Costa, MD, 50 mg at 10/19/20 2230 .  multivitamin with minerals tablet 1 tablet, 1 tablet, Oral, Daily, Samuella Cota, MD, 1 tablet at 10/19/20 0906 .  ondansetron (ZOFRAN) injection 4 mg, 4 mg, Intravenous, Q8H PRN, Ivor Costa, MD, 4 mg at 10/17/20 1309 .  polyethylene glycol (MIRALAX / GLYCOLAX) packet 17 g, 17 g, Oral, BID,  Samuella Cota, MD, 17 g at 10/18/20 1024 .  potassium chloride SA (KLOR-CON) CR tablet 20 mEq, 20 mEq, Oral, Daily, Earlie Server, MD, 20 mEq at 10/19/20 0927 .  predniSONE (DELTASONE) tablet 50 mg, 50 mg, Oral, Q breakfast, Shaneka Efaw, MD .  scopolamine (TRANSDERM-SCOP) 1 MG/3DAYS 1.5 mg, 1 patch, Transdermal, Q72H, Earlie Server, MD, 1.5 mg at 10/17/20 1437 .  simethicone (MYLICON) chewable tablet 80 mg, 80 mg, Oral, Daily PRN, Earlie Server, MD, 80 mg at 10/15/20 1002 .  sodium chloride (OCEAN) 0.65 % nasal spray 1 spray, 1 spray, Each Nare, PRN, Samuella Cota, MD .  sodium chloride flush (NS) 0.9 % injection 10-40 mL, 10-40 mL, Intracatheter, Q12H, Sharen Hones, MD, 10 mL at 10/19/20 2230 .  sodium chloride flush (NS) 0.9 % injection 10-40 mL, 10-40 mL, Intracatheter, PRN, Sharen Hones, MD, 10 mL at 10/10/20 1447    ALLERGIES   Patient has no known allergies.     REVIEW OF SYSTEMS    Review of Systems:  Gen:  Denies  fever, sweats, chills weigh loss  HEENT: Denies blurred vision, double vision, ear pain, eye pain, hearing loss, nose bleeds, sore throat Cardiac:  No dizziness, chest pain or heaviness, chest tightness,edema Resp:   Denies cough or sputum porduction, shortness of breath,wheezing, hemoptysis,  Gi: Denies swallowing difficulty, stomach pain, nausea or vomiting, diarrhea, constipation, bowel incontinence Gu:  Denies bladder incontinence, burning urine Ext:   Denies Joint pain, stiffness or swelling Skin: Denies  skin rash, easy bruising or bleeding or hives Endoc:  Denies polyuria, polydipsia , polyphagia or weight change Psych:   Denies depression, insomnia or hallucinations   Other:  All other systems negative   VS: BP 117/68 (BP Location: Left Arm)   Pulse (!) 109   Temp 98.9 F (37.2 C)   Resp 20   Ht 5' 10"  (1.778 m)   Wt 81.2 kg   LMP 06/16/2015 Comment: Tubal Ligation   SpO2 99%   BMI 25.68 kg/m      PHYSICAL EXAM    GENERAL:NAD, no fevers,  chills, no weakness no fatigue HEAD: Normocephalic, atraumatic.  EYES: Pupils equal, round, reactive to light. Extraocular  muscles intact. No scleral icterus.  MOUTH: dry oral mucosa. Dentition intact. No abscess noted.  EAR, NOSE, THROAT: Clear without exudates. No external lesions.  NECK: Supple. No thyromegaly. No nodules. No JVD.  PULMONARY: mild rhonchi bilatarally CARDIOVASCULAR: S1 and S2. Regular rate and rhythm. No murmurs, rubs, or gallops. No edema. Pedal pulses 2+ bilaterally.  GASTROINTESTINAL: Soft, nontender, nondistended. No masses. Positive bowel sounds. No hepatosplenomegaly.  MUSCULOSKELETAL: No swelling, clubbing, or edema. Range of motion full in all extremities.  NEUROLOGIC: Cranial nerves II through XII are intact. No gross focal neurological deficits. Sensation intact. Reflexes intact.  SKIN: No ulceration, lesions, rashes, or cyanosis. Skin warm and dry. Turgor intact.  PSYCHIATRIC: Mood, affect within normal limits. The patient is awake, alert and oriented x 3. Insight, judgment intact.       IMAGING    DG Chest 2 View  Result Date: 10/07/2020 CLINICAL DATA:  Hypoxia, shortness of breath, metastatic breast cancer EXAM: CHEST - 2 VIEW COMPARISON:  09/13/2020 FINDINGS: LEFT subclavian Port-A-Cath with tip projecting over RIGHT atrium. Enlargement of cardiac silhouette. Pulmonary vascular congestion. Persistent enlargement of RIGHT hilum question mass or adenopathy. Increased infiltrate of the mid to lower RIGHT lung. Small RIGHT pleural effusion again seen. Accentuation of markings in LEFT perihilar region little changed. No pneumothorax or acute osseous findings. IMPRESSION: Increased RIGHT perihilar infiltrate with persistent small RIGHT pleural effusion. Enlargement of RIGHT pulmonary hilum by adenopathy versus perihilar mass. Electronically Signed   By: Lavonia Dana M.D.   On: 10/07/2020 11:32   CT Angio Chest PE W and/or Wo Contrast  Result Date:  10/07/2020 CLINICAL DATA:  Breast carcinoma. Tachycardia and shortness of breath EXAM: CT ANGIOGRAPHY CHEST WITH CONTRAST TECHNIQUE: Multidetector CT imaging of the chest was performed using the standard protocol during bolus administration of intravenous contrast. Multiplanar CT image reconstructions and MIPs were obtained to evaluate the vascular anatomy. CONTRAST:  65m OMNIPAQUE IOHEXOL 350 MG/ML SOLN COMPARISON:  CT angiogram chest June 15, 2020; chest radiograph October 07, 2020 FINDINGS: Cardiovascular: There is no evident pulmonary embolus. No appreciable thoracic aortic aneurysm or dissection. Visualized great vessels appear unremarkable. There is no appreciable pericardial effusion or pericardial thickening. Port-A-Cath tip is in the superior vena cava near the cavoatrial junction. Mediastinum/Nodes: Thyroid appears normal. There are multiple axillary lymph nodes bilaterally, more severe on the right than on the left. The largest lymph node is seen in the right axillary region measuring 2.7 x 2.1 cm. There is adenopathy in the right supraclavicular and infraclavicular regions with extension of adenopathy into the right Peri carinal region. Largest individual lymph node in these areas measures 2.6 x 1.8 cm. Several left supraclavicular lymph nodes are also noted, largest measuring 1.7 x 1.3 cm. There are several subcentimeter mediastinal lymph nodes. There is extensive retrocrural adenopathy. The largest lymph node or collection of matted lymph nodes is seen inferior to the aorta on the left posteriorly measuring 3.4 x 2.1 cm. No esophageal lesions are evident. Lungs/Pleura: There is extensive airspace consolidation in portions of the right middle and lower lobes with interspersed areas of loculated pleural effusion. There is ill-defined airspace opacity consistent with pneumonia in the left upper lobe. Loculated fluid tracks along the left major fissure. There may well be associated metastasis within  the fissure given the extensive nodularity in this area. There is a small left pleural effusion with left base atelectasis. Pleural metastases noted along each upper hemithorax, primarily posteriorly as well as in the right apex. Upper Abdomen:  Retrocrural adenopathy extends into the upper abdominal region. Visualized upper abdominal structures otherwise appear unremarkable. Musculoskeletal: There is a large mass occupying the right breast and chest wall region with invasion of the deep muscles of the chest wall on the right. There is also invasion of the right axilla. There is extensive subcutaneous thickening in both breast regions. There is a mass in the lateral left breast with extension of soft tissue prominence along the lateral left hemithorax, likely due to extension of neoplasm into the adjacent soft tissues. There is widespread sclerotic bony metastatic disease throughout the thoracic region. Review of the MIP images confirms the above findings. IMPRESSION: 1. No demonstrable pulmonary embolus. No thoracic aortic aneurysm or dissection. 2. Areas of airspace consolidation noted in the right middle and lower lobes. More ill-defined infiltrate is noted in the left upper lobe anteriorly. Areas of loculated fluid noted in the left major fissure. Nodularity in this area could indicate superimposed masses in left major fissure. There is pleural thickening along both posterior upper hemithorax regions, likely pleural metastases. Loculated fluid noted in areas of infiltrate on the right. Small left pleural effusion noted. 3. Adenopathy at multiple sites, most severe in the retrocrural regions bilaterally, right supraclavicular region, and right axillary region, although there is left axillary and left supraclavicular adenopathy. 4. Large mass lesions occupying the right breast with extension and invasion of the chest wall and right axillary regions. A lesser degree of metastatic disease to the left of midline in  the subcutaneous tissues and lateral left breast/lateral left chest wall soft tissues noted. 5. Multifocal sclerotic bony metastatic disease throughout the thoracic region noted. Electronically Signed   By: Lowella Grip III M.D.   On: 10/07/2020 13:23   US Venous Img Upper Uni Right(DVT)  Result Date: 10/08/2020 CLINICAL DATA:  History of stage IV breast cancer now with right upper extremity pain and edema. Evaluate for DVT. EXAM: RIGHT UPPER EXTREMITY VENOUS DOPPLER ULTRASOUND TECHNIQUE: Gray-scale sonography with graded compression, as well as color Doppler and duplex ultrasound were performed to evaluate the upper extremity deep venous system from the level of the subclavian vein and including the jugular, axillary, basilic, radial, ulnar and upper cephalic vein. Spectral Doppler was utilized to evaluate flow at rest and with distal augmentation maneuvers. COMPARISON:  None. FINDINGS: Contralateral Subclavian Vein: Respiratory phasicity is normal and symmetric with the symptomatic side. No evidence of thrombus. Normal compressibility. Internal Jugular Vein: No evidence of thrombus. Normal compressibility, respiratory phasicity and response to augmentation. Subclavian Vein: No evidence of thrombus. Normal compressibility, respiratory phasicity and response to augmentation. Axillary Vein: Appears patent where imaged though evaluation degraded secondary to patient's discomfort with sonographic evaluation of the axilla. Cephalic Vein: No evidence of thrombus. Normal compressibility, respiratory phasicity and response to augmentation. Basilic Vein: No evidence of thrombus. Normal compressibility, respiratory phasicity and response to augmentation. Brachial Veins: No evidence of thrombus within either of the paired brachial veins. Normal compressibility, respiratory phasicity and response to augmentation. Radial Veins: No evidence of thrombus. Normal compressibility, respiratory phasicity and response to  augmentation. Ulnar Veins: No evidence of thrombus. Normal compressibility, respiratory phasicity and response to augmentation. Venous Reflux:  None visualized. Other Findings: There is a moderate amount of subcutaneous edema at the level of the right upper arm. IMPRESSION: No evidence of DVT within the right upper extremity. Electronically Signed   By: Sandi Mariscal M.D.   On: 10/08/2020 14:29   DG Chest Port 1 View  Result Date: 10/15/2020 CLINICAL  DATA:  Pneumonia and pleural effusion. Metastatic breast cancer. EXAM: PORTABLE CHEST 1 VIEW COMPARISON:  10/13/2020 chest radiograph and prior studies FINDINGS: Cardiomediastinal silhouette is unchanged with RIGHT mediastinal fullness. A LEFT subclavian Port-A-Cath with tip overlying the UPPER RIGHT atrium again noted. Scattered opacities within both lungs are again noted with a RIGHT pleural effusion. There is no evidence of pneumothorax. No significant changes are identified. IMPRESSION: Unchanged appearance of the chest with scattered opacities within both lungs and RIGHT pleural effusion. Electronically Signed   By: Margarette Canada M.D.   On: 10/15/2020 11:06   DG Chest Port 1 View  Result Date: 10/13/2020 CLINICAL DATA:  Pleural effusion EXAM: PORTABLE CHEST 1 VIEW COMPARISON:  10/07/2020 FINDINGS: Unchanged positioning of left-sided Port-A-Cath. Stable cardiomediastinal contours. Persistent small right pleural effusion. Slight progression of airspace opacity throughout the right lung most pronounced within the right lung base. Similar interstitial opacities throughout the left lung. No pneumothorax. IMPRESSION: 1. Slight progression of airspace opacity throughout the right lung, most pronounced within the right lung base. 2. Persistent small right pleural effusion. Electronically Signed   By: Davina Poke D.O.   On: 10/13/2020 14:10   ECHOCARDIOGRAM COMPLETE  Result Date: 10/10/2020    ECHOCARDIOGRAM REPORT   Patient Name:   REGANNE MESSERSCHMIDT Date of  Exam: 10/10/2020 Medical Rec #:  [id number]           Height:       70.0 in Accession #:    [id number]          Weight:       178.7 lb Date of Birth:  Dec 22, 1965           BSA:          1.990 m Patient Age:    40 years            BP:           119/79 mmHg Patient Gender: F                   HR:           105 bpm. Exam Location:  [facility] Procedure: 2D Echo, Limited Color Doppler and Cardiac Doppler Indications:     R06.03 Acute Respiratory Distress  History:         Patient has no prior history of Echocardiogram examinations.                  Signs/Symptoms:Shortness of Breath. Breast cancer.  Sonographer:     Charmayne Sheer RDCS (AE) Referring Phys:  [id number] Sharen Hones Diagnosing Phys: Kathlyn Sacramento MD  Sonographer Comments: Technically challenging study due to limited acoustic windows. IMPRESSIONS  1. Left ventricular ejection fraction, by estimation, is 50 to 55%. The left ventricle has low normal function. Left ventricular endocardial border not optimally defined to evaluate regional wall motion. Left ventricular diastolic parameters are indeterminate.  2. Right ventricular systolic function is normal. The right ventricular size is normal. Tricuspid regurgitation signal is inadequate for assessing PA pressure.  3. The mitral valve is normal in structure. No evidence of mitral valve regurgitation. No evidence of mitral stenosis.  4. The aortic valve is normal in structure. Aortic valve regurgitation is not visualized. No aortic stenosis is present.  5. The inferior vena cava is normal in size with greater than 50% respiratory variability, suggesting right atrial pressure of 3 mmHg. FINDINGS  Left Ventricle: Left ventricular ejection fraction, by estimation, is 50 to 55%. The left ventricle  has low normal function. Left ventricular endocardial border not optimally defined to evaluate regional wall motion. The left ventricular internal cavity  size was normal in size. There is no left ventricular hypertrophy. Left  ventricular diastolic parameters are indeterminate. Right Ventricle: The right ventricular size is normal. No increase in right ventricular wall thickness. Right ventricular systolic function is normal. Tricuspid regurgitation signal is inadequate for assessing PA pressure. Left Atrium: Left atrial size was normal in size. Right Atrium: Right atrial size was normal in size. Pericardium: There is no evidence of pericardial effusion. Mitral Valve: The mitral valve is normal in structure. No evidence of mitral valve regurgitation. No evidence of mitral valve stenosis. MV peak gradient, 4.5 mmHg. The mean mitral valve gradient is 2.0 mmHg. Tricuspid Valve: The tricuspid valve is normal in structure. Tricuspid valve regurgitation is not demonstrated. No evidence of tricuspid stenosis. Aortic Valve: The aortic valve is normal in structure. Aortic valve regurgitation is not visualized. No aortic stenosis is present. Aortic valve mean gradient measures 4.0 mmHg. Aortic valve peak gradient measures 6.8 mmHg. Pulmonic Valve: The pulmonic valve was not well visualized. Pulmonic valve regurgitation is not visualized. No evidence of pulmonic stenosis. Aorta: The aortic root is normal in size and structure. Venous: The inferior vena cava is normal in size with greater than 50% respiratory variability, suggesting right atrial pressure of 3 mmHg. IAS/Shunts: No atrial level shunt detected by color flow Doppler.   LV Volumes (MOD) LV vol d, MOD A4C: 99.4 ml Diastology LV vol s, MOD A4C: 58.3 ml LV e' lateral:   10.30 cm/s LV SV MOD A4C:     99.4 ml LV E/e' lateral: 5.8  LEFT ATRIUM           Index LA Vol (A4C): 41.5 ml 20.86 ml/m  AORTIC VALVE AV Vmax:           130.00 cm/s AV Vmean:          93.400 cm/s AV VTI:            0.243 m AV Peak Grad:      6.8 mmHg AV Mean Grad:      4.0 mmHg LVOT Vmax:         92.70 cm/s LVOT Vmean:        60.500 cm/s LVOT VTI:          0.151 m LVOT/AV VTI ratio: 0.62 MITRAL VALVE MV Area (PHT): 6.96 cm     SHUNTS MV Peak grad:  4.5 mmHg    Systemic VTI: 0.15 m MV Mean grad:  2.0 mmHg MV Vmax:       1.06 m/s MV Vmean:      67.9 cm/s MV Decel Time: 109 msec MV E velocity: 59.60 cm/s MV A velocity: 68.60 cm/s MV E/A ratio:  0.87 Kathlyn Sacramento MD Electronically signed by Kathlyn Sacramento MD Signature Date/Time: 10/10/2020/2:00:06 PM    Final          ASSESSMENT/PLAN   Malignant pleural effusion    - since patient had initial therapeutic response to thoracentesis she will likely benefit from permament tunneled pleural catheter via IR service as palliative option for malignant effusion due to high probability of rapid re-accumulation    -there is no renal failure, hepatic synthetic function within reference range without HASH/cirhosis, no CHF per most recent TTE   - no lymphadema   Metastatic lung cancer   -Currently being followed by oncology Dr Tasia Catchings - appreciate input  Compressive atelectasis   Worse at Right lowe lobe - patient is weak and unlikely to be able to perform well with incentive spirometry.  She is a good candidate for albuterol infused Metaneb therapy can do BID to help with recruitment.     Chronic deconditioning and severe protein calorie malnutrition     -nutritional and PT evaluation     -palliative care on case - appreciate input     -bitemporal wasting noted on examination     - high risk refeeding syndrome - monitor electorlytes - will place consult for pharmD and RD     Acute hypoxemic respiratory failure    - patient generally not on supplemental O2 currently on 7L/min supplemental O2>>4>>5- 10/15/20>>3L/min     - she has leukocytosis but gram stain is negative on pleural fluid and there is no obvious infiltrate although basis bilaterally may have pneumonia, this is obscured by effusion and atelectatic sement.  Legionella and strep pneumo ag is negative. I feel that CAP is less likely.  Have ordered iflammatory markers and PCT.      - lasix to help with residual  effusion     -Soluedrol 20 bid IV>>prednisone taper 10/18/20     - lasix 40 iv daily >>bid 10/16/20     - albumin 72m 25% 1 amd daily with diuresis     - chest physitherapy to recruit atelectatic lungs     -etiology is all of the above conditions     - TTE with no findings to suggest CHF realted hypoxemia  -repeat cxr 10/15/20- reviewed     Thank you for allowing me to participate in the care of this patient.   Patient/Family are satisfied with care plan and all questions have been answered.  This document was prepared using Dragon voice recognition software and may include unintentional dictation errors.     FOttie Glazier M.D.  Division of [place]

## 2020-10-20 NOTE — Consult Note (Addendum)
PHARMACY CONSULT NOTE  Pharmacy Consult for Electrolyte Monitoring and Replacement   Recent Labs: Potassium (mmol/L)  Date Value  10/20/2020 3.9   Magnesium (mg/dL)  Date Value  10/20/2020 2.3   Calcium (mg/dL)  Date Value  10/20/2020 8.4 (L)   Albumin (g/dL)  Date Value  10/15/2020 2.6 (L)   Phosphorus (mg/dL)  Date Value  10/20/2020 3.4   Sodium (mmol/L)  Date Value  10/20/2020 144   Assessment: Patient is a 55 y/o F with medical history including metastatic breast cancer on chemotherapy, tachycardia, anemia, GERD, malignant pleural effusion, hemothorax who was admitted 2/25 with respiratory failure secondary to pneumonia versus worsening metastatic disease. Pharmacy consulted to assist with electrolyte monitoring and replacement as indicated.   Per chart review, appetite appears poor.   Diuresis: IV Lasix 40 BID > QD  3/10 labs:  Na: 142>144(stable) K: 4.3>3.9 (downtrend) Mg: 2.6>2.3 (WNL) Phos: 2.8>3.4 (WNL) Ca: 8.4 >8.4 (albumin: 2.6) -- corrected 9.1 (stable) Scr 0.49>0.37  Goal of Therapy:  Electrolytes within normal limits  Plan:  - Continues on lasix 40 IV BID>QD, Mag PO 64mg  daily, Calcium daily,  KCL 20 meq PO daily - no electrolyte replacement needed at this time.  --Will follow-up electrolytes with AM labs. f/u Mag/phos every other day as has been stable  Lorna Dibble 10/20/2020 7:20 AM

## 2020-10-21 ENCOUNTER — Inpatient Hospital Stay: Payer: Medicaid Other | Admitting: Hospice and Palliative Medicine

## 2020-10-21 ENCOUNTER — Telehealth: Payer: Self-pay

## 2020-10-21 DIAGNOSIS — C50919 Malignant neoplasm of unspecified site of unspecified female breast: Secondary | ICD-10-CM | POA: Diagnosis not present

## 2020-10-21 DIAGNOSIS — J9601 Acute respiratory failure with hypoxia: Secondary | ICD-10-CM | POA: Diagnosis not present

## 2020-10-21 DIAGNOSIS — A419 Sepsis, unspecified organism: Secondary | ICD-10-CM | POA: Diagnosis not present

## 2020-10-21 DIAGNOSIS — R110 Nausea: Secondary | ICD-10-CM | POA: Diagnosis not present

## 2020-10-21 DIAGNOSIS — T451X5A Adverse effect of antineoplastic and immunosuppressive drugs, initial encounter: Secondary | ICD-10-CM

## 2020-10-21 DIAGNOSIS — R630 Anorexia: Secondary | ICD-10-CM

## 2020-10-21 LAB — URINALYSIS, COMPLETE (UACMP) WITH MICROSCOPIC
Bilirubin Urine: NEGATIVE
Glucose, UA: NEGATIVE mg/dL
Hgb urine dipstick: NEGATIVE
Ketones, ur: NEGATIVE mg/dL
Leukocytes,Ua: NEGATIVE
Nitrite: NEGATIVE
Protein, ur: NEGATIVE mg/dL
Specific Gravity, Urine: 1.026 (ref 1.005–1.030)
pH: 5 (ref 5.0–8.0)

## 2020-10-21 LAB — CBC WITH DIFFERENTIAL/PLATELET
Abs Immature Granulocytes: 0.41 10*3/uL — ABNORMAL HIGH (ref 0.00–0.07)
Basophils Absolute: 0 10*3/uL (ref 0.0–0.1)
Basophils Relative: 0 %
Eosinophils Absolute: 0.1 10*3/uL (ref 0.0–0.5)
Eosinophils Relative: 1 %
HCT: 29.9 % — ABNORMAL LOW (ref 36.0–46.0)
Hemoglobin: 8.7 g/dL — ABNORMAL LOW (ref 12.0–15.0)
Immature Granulocytes: 3 %
Lymphocytes Relative: 7 %
Lymphs Abs: 0.8 10*3/uL (ref 0.7–4.0)
MCH: 24.9 pg — ABNORMAL LOW (ref 26.0–34.0)
MCHC: 29.1 g/dL — ABNORMAL LOW (ref 30.0–36.0)
MCV: 85.4 fL (ref 80.0–100.0)
Monocytes Absolute: 0.2 10*3/uL (ref 0.1–1.0)
Monocytes Relative: 1 %
Neutro Abs: 11 10*3/uL — ABNORMAL HIGH (ref 1.7–7.7)
Neutrophils Relative %: 88 %
Platelets: 263 10*3/uL (ref 150–400)
RBC: 3.5 MIL/uL — ABNORMAL LOW (ref 3.87–5.11)
RDW: 16.1 % — ABNORMAL HIGH (ref 11.5–15.5)
WBC: 12.6 10*3/uL — ABNORMAL HIGH (ref 4.0–10.5)
nRBC: 0 % (ref 0.0–0.2)

## 2020-10-21 LAB — OSMOLALITY, URINE: Osmolality, Ur: 774 mOsm/kg (ref 300–900)

## 2020-10-21 LAB — BASIC METABOLIC PANEL
Anion gap: 9 (ref 5–15)
BUN: 25 mg/dL — ABNORMAL HIGH (ref 6–20)
CO2: 34 mmol/L — ABNORMAL HIGH (ref 22–32)
Calcium: 8.1 mg/dL — ABNORMAL LOW (ref 8.9–10.3)
Chloride: 100 mmol/L (ref 98–111)
Creatinine, Ser: 0.35 mg/dL — ABNORMAL LOW (ref 0.44–1.00)
GFR, Estimated: >60 mL/min (ref >60)
Glucose, Bld: 82 mg/dL (ref 70–99)
Potassium: 3.4 mmol/L — ABNORMAL LOW (ref 3.5–5.1)
Sodium: 143 mmol/L (ref 135–145)

## 2020-10-21 MED ORDER — POTASSIUM CHLORIDE CRYS ER 20 MEQ PO TBCR: 20.0000 meq | EXTENDED_RELEASE_TABLET | Freq: Every day | ORAL | 0 refills | Status: DC

## 2020-10-21 MED ORDER — HEPARIN SOD (PORK) LOCK FLUSH 100 UNIT/ML IV SOLN
500.0000 [IU] | Freq: Once | INTRAVENOUS | Status: AC
  Administered 2020-10-21: 500 [IU] via INTRAVENOUS
  Filled 2020-10-21 (×2): qty 5

## 2020-10-21 MED ORDER — ADULT MULTIVITAMIN W/MINERALS CH: 1.0000 | ORAL_TABLET | Freq: Every day | ORAL | 0 refills | Status: DC

## 2020-10-21 MED ORDER — ALBUTEROL SULFATE (2.5 MG/3ML) 0.083% IN NEBU: 2.5000 mg | INHALATION_SOLUTION | Freq: Four times a day (QID) | RESPIRATORY_TRACT | 12 refills | Status: DC | PRN

## 2020-10-21 MED ORDER — ALBUTEROL SULFATE HFA 108 (90 BASE) MCG/ACT IN AERS: 2.0000 | INHALATION_SPRAY | RESPIRATORY_TRACT | 3 refills | Status: AC | PRN

## 2020-10-21 MED ORDER — DRONABINOL 5 MG PO CAPS: 5.0000 mg | ORAL_CAPSULE | Freq: Two times a day (BID) | ORAL | 1 refills | Status: DC

## 2020-10-21 MED ORDER — PREDNISONE 5 MG PO TABS: 40.0000 mg | ORAL_TABLET | Freq: Every day | ORAL | 0 refills | Status: DC

## 2020-10-21 MED ORDER — ENSURE ENLIVE PO LIQD: 237.0000 mL | Freq: Four times a day (QID) | ORAL | 12 refills | Status: AC

## 2020-10-21 MED ORDER — SALINE SPRAY 0.65 % NA SOLN: 1.0000 | NASAL | 0 refills | Status: AC | PRN

## 2020-10-21 MED ORDER — FLUCONAZOLE 100 MG PO TABS: 100.0000 mg | ORAL_TABLET | Freq: Every day | ORAL | 0 refills | Status: AC

## 2020-10-21 MED ORDER — MAGIC MOUTHWASH W/LIDOCAINE: 15.0000 mL | Freq: Three times a day (TID) | ORAL | 0 refills | Status: DC

## 2020-10-21 MED ORDER — POLYETHYLENE GLYCOL 3350 17 G PO PACK: 17.0000 g | PACK | Freq: Two times a day (BID) | ORAL | 0 refills | Status: AC

## 2020-10-21 MED ORDER — POTASSIUM CHLORIDE CRYS ER 20 MEQ PO TBCR
40.0000 meq | EXTENDED_RELEASE_TABLET | Freq: Once | ORAL | Status: AC
  Administered 2020-10-21: 40 meq via ORAL
  Filled 2020-10-21: qty 2

## 2020-10-21 NOTE — TOC Transition Note (Signed)
Transition of Care [facility]) - CM/SW Discharge Note   Patient Details  Name: Jessica Willis MRN: [id number] Date of Birth: 1966/07/21  [facility] of [facility] CM/SW Contact:  Pete Pelt, RN Phone Number: 10/21/2020, 1:59 PM   Clinical Narrative:   [facility] visited patient at bedside.  Purpose of visit: Discharge planning.  Patient discharged today to home with home health.  [facility] agency is [facility] for PT/OT and nurse, patient accepts assignment.  [facility] stated through social work that they will notify her of arrival this weekend.  [employer] at bedside delivering oxygen and giving instruction, patient acknowledged understanding.  Patient did not have any questions or concerns for TOC at this time, and states her friend is transporting her home.    Final next level of care: [place] Barriers to Discharge: Barriers Resolved   Patient Goals and CMS Choice     Choice offered to / list presented to : NA  Discharge Placement                    Patient and family notified of of transfer: 10/21/20  Discharge Plan and Services                  DME Agency: [employer] Date DME Agency Contacted: 10/21/20   Representative spoke with at DME Agency: Mardene Celeste and [place]: RN,PT,OT [place] Agency: [place] ([place]) Date [place]: 10/21/20 Time HH Agency Contacted: 1400 Representative spoke with at [place]: [place] ([place]) Interventions     Readmission Risk Interventions Readmission Risk Prevention Plan 10/19/2020  Transportation Screening Complete  Medication Review Press photographer) Complete  PCP or Specialist appointment within 3-5 days of discharge Complete  HRI or [place] Complete  SW Recovery Care/Counseling Consult Complete  [place] Not Applicable  Some recent data might be hidden

## 2020-10-21 NOTE — Telephone Encounter (Signed)
Done..  Pt has been sched for  lab/MD/ AC/**NEW** Cytoxan nx Friday 10/28/20 as requested I was unable to reach pt by phone. Pt is still admitted and her phone  maybe off. I'll try giving her a call back. A NEW appt letter will be mailed out And pt should also see her NEW upcoming appts when D/C

## 2020-10-21 NOTE — Discharge Summary (Signed)
Physician Discharge Summary  Jessica Willis HUD:[id number] DOB: 10/03/1965 DOA: 10/07/2020  PCP: [place] date: 10/07/2020 Discharge date: 10/21/2020  Admitted From: Home Disposition: Home  Recommendations for Outpatient Follow-up:  1. Follow up with PCP in 1-2 weeks 2. Follow-up with pulmonology 3. Follow-up with oncology 4. Please obtain BMP/CBC in one week 5. Please follow up on the following pending results: None  Home Health: Yes Equipment/Devices: Home oxygen Discharge Condition: Stable CODE STATUS: DNR Diet recommendation: Heart Healthy   Brief/Interim Summary: 55 year old woman PMH stage IV metastatic breast cancer on chemotherapy, malignant pleural effusion, hemothorax presented with shortness of breath. Admitted for acute hypoxic respiratory failure, sepsis thought secondary to pneumonia complicated by metastatic breast cancer. Seen by pulmonology, ID, oncology and palliative care. Treated with antibiotics. Plan is to continue aggressive treatment, started chemotherapy 3/4. Major issue is acute hypoxic respiratory failure requiring 3-4 L nasal cannula. She was discharged home on 3-4 liters of oxygen.  Continue to feel weak and keep postponing discharge 1 day at a time.  She was medically stable for the past few days.  Very high risk for readmission. She was seen by pulmonology who signed off and recommending slow prednisone taper which was ordered.  Sepsis was considered on admission.  There was extensive airspace abnormalities of the right lung and loculated pleural effusion.  Concern of infection versus metastatic disease/lymphangitis spread of cancer progression. She will continue with Lasix at home and needs to follow-up with pulmonology as an outpatient.  Patient has stage IV metastatic fungating breast cancer with large necrotizing mass, high tumor burden which continue to progress on weekly Taxol.  She was restarted her chemotherapy on  10/14/2020 and will follow up with her oncologist as an outpatient for further management.  Home health services were ordered to help with wound care.  Patient has a life limiting illness, advance cancer which is not curable.  Palliative care is also following.  Guarded prognosis and high risk for readmission.  Discharge Diagnoses:  Principal Problem:   Pneumonia due to infectious organism Active Problems:   Sepsis ([place])   Acute respiratory failure with hypoxia ([place])   Metastatic breast cancer ([facility])   Tachycardia   Iron deficiency anemia   Chemotherapy-induced nausea   Poor appetite   Discharge Instructions  Discharge Instructions    Diet - low sodium heart healthy   Complete by: As directed    Discharge instructions   Complete by: As directed    It was pleasure taking care of you. You are being discharged with oxygen, please use it as directed. Please follow-up with your oncologist and continue with chemotherapy as they directed you. Follow-up with pulmonology for further recommendations.   Discharge instructions   Complete by: As directed    It was pleasure taking care of you. Please decrease the dose of prednisone by 5 mg every other day. Follow-up with your pulmonologist for further recommendations. Follow-up with your oncologist for your cancer treatment. You are also being discharged with home oxygen, please use it as directed.   Discharge wound care:   Complete by: As directed    Apply double-folded xeroform gauze to right breast Q day, then cover with ABD pads.  Cut the crotch out of a pair of mesh underwear to create a "tank top" and use this to hold dressings in place, and avoid use of tape. Moisten dressing each time with NS to assist with removal.   Discharge wound care:   Complete by:  As directed    Apply double-folded xeroform gauze to right breast Q day, then cover with ABD pads.  Cut the crotch out of a pair of mesh underwear to create a "tank top" and use this  to hold dressings in place, and avoid use of tape. Moisten dressing each time with NS to assist with removal.   Increase activity slowly   Complete by: As directed    Increase activity slowly   Complete by: As directed      Allergies as of 10/21/2020   No Known Allergies     Medication List    TAKE these medications   albuterol (2.5 MG/3ML) 0.083% nebulizer solution Commonly known as: PROVENTIL Take 3 mLs (2.5 mg total) by nebulization every 6 (six) hours as needed for wheezing or shortness of breath.   albuterol 108 (90 Base) MCG/ACT inhaler Commonly known as: VENTOLIN HFA Inhale 2 puffs into the lungs every 4 (four) hours as needed for wheezing or shortness of breath.   calcium carbonate 600 MG Tabs tablet Commonly known as: Calcium 600 Take 2 tablets (1,200 mg total) by mouth daily with breakfast. What changed: additional instructions   dronabinol 5 MG capsule Commonly known as: MARINOL Take 1 capsule (5 mg total) by mouth 2 (two) times daily before lunch and supper.   feeding supplement Liqd Take 237 mLs by mouth 4 (four) times daily.   FeroSul 325 (65 FE) MG tablet Generic drug: ferrous sulfate TAKE ONE (1) TABLET BY MOUTH TWO TIMES PER DAY WITH A MEAL   fluconazole 100 MG tablet Commonly known as: DIFLUCAN Take 1 tablet (100 mg total) by mouth daily for 2 days. Start taking on: October 22, 2020   lidocaine-prilocaine cream Commonly known as: EMLA Apply 1 application topically as needed. Place a small amount of cream to port site prior to each chemo treatment, 1-2 hours before treatment.   magic mouthwash w/lidocaine Soln Take 15 mLs by mouth 3 (three) times daily.   magnesium chloride 64 MG Tbec SR tablet Commonly known as: SLOW-MAG Take 1 tablet (64 mg total) by mouth daily.   metoprolol tartrate 50 MG tablet Commonly known as: LOPRESSOR Take 1 tablet (50 mg total) by mouth 2 (two) times daily.   multivitamin with minerals Tabs tablet Take 1 tablet by  mouth daily.   polyethylene glycol 17 g packet Commonly known as: MIRALAX / GLYCOLAX Take 17 g by mouth 2 (two) times daily.   potassium chloride SA 20 MEQ tablet Commonly known as: KLOR-CON Take 1 tablet (20 mEq total) by mouth daily. What changed: how much to take   predniSONE 5 MG tablet Commonly known as: DELTASONE Take 8 tablets (40 mg total) by mouth daily with breakfast. Decrease your dose by 5 mg every other day.   sodium chloride 0.65 % Soln nasal spray Commonly known as: OCEAN Place 1 spray into both nostrils as needed for congestion.            Durable Medical Equipment  (From admission, onward)         Start     Ordered   10/20/20 0820  For home use only DME oxygen  Once       Question Answer Comment  Length of Need Lifetime   Mode or (Route) Nasal cannula   Liters per Minute 4   Frequency Continuous (stationary and portable oxygen unit needed)   Oxygen conserving device Yes   Oxygen delivery system Gas      10/20/20  0819           Discharge Care Instructions  (From admission, onward)         Start     Ordered   10/21/20 0000  Discharge wound care:       Comments: Apply double-folded xeroform gauze to right breast Q day, then cover with ABD pads.  Cut the crotch out of a pair of mesh underwear to create a "tank top" and use this to hold dressings in place, and avoid use of tape. Moisten dressing each time with NS to assist with removal.   10/21/20 1135   10/21/20 0000  Discharge wound care:       Comments: Apply double-folded xeroform gauze to right breast Q day, then cover with ABD pads.  Cut the crotch out of a pair of mesh underwear to create a "tank top" and use this to hold dressings in place, and avoid use of tape. Moisten dressing each time with NS to assist with removal.   10/21/20 [place]. Schedule an appointment as soon as possible for a visit.   Contact information: [place] [address] [phone]        Ottie Glazier, MD. Schedule an appointment as soon as possible for a visit in 1 week(s).   Specialty: Pulmonary Disease Contact information: [place] [address] [phone]        Earlie Server, MD. Schedule an appointment as soon as possible for a visit.   Specialty: Oncology Contact information: [place] [address] [phone]              No Known Allergies  Consultations:  Oncology  ID  Pulmonology  Palliative care  Procedures/Studies: DG Chest 2 View  Result Date: 10/07/2020 CLINICAL DATA:  Hypoxia, shortness of breath, metastatic breast cancer EXAM: CHEST - 2 VIEW COMPARISON:  09/13/2020 FINDINGS: LEFT subclavian Port-A-Cath with tip projecting over RIGHT atrium. Enlargement of cardiac silhouette. Pulmonary vascular congestion. Persistent enlargement of RIGHT hilum question mass or adenopathy. Increased infiltrate of the mid to lower RIGHT lung. Small RIGHT pleural effusion again seen. Accentuation of markings in LEFT perihilar region little changed. No pneumothorax or acute osseous findings. IMPRESSION: Increased RIGHT perihilar infiltrate with persistent small RIGHT pleural effusion. Enlargement of RIGHT pulmonary hilum by adenopathy versus perihilar mass. Electronically Signed   By: Lavonia Dana M.D.   On: 10/07/2020 11:32   CT Angio Chest PE W and/or Wo Contrast  Result Date: 10/07/2020 CLINICAL DATA:  Breast carcinoma. Tachycardia and shortness of breath EXAM: CT ANGIOGRAPHY CHEST WITH CONTRAST TECHNIQUE: Multidetector CT imaging of the chest was performed using the standard protocol during bolus administration of intravenous contrast. Multiplanar CT image reconstructions and MIPs were obtained to evaluate the vascular anatomy. CONTRAST:  62mL OMNIPAQUE IOHEXOL 350 MG/ML SOLN COMPARISON:  CT angiogram chest June 15, 2020; chest radiograph October 07, 2020  FINDINGS: Cardiovascular: There is no evident pulmonary embolus. No appreciable thoracic aortic aneurysm or dissection. Visualized great vessels appear unremarkable. There is no appreciable pericardial effusion or pericardial thickening. Port-A-Cath tip is in the superior vena cava near the cavoatrial junction. Mediastinum/Nodes: Thyroid appears normal. There are multiple axillary lymph nodes bilaterally, more severe on the right than on the left. The largest lymph node is seen in the right axillary region measuring 2.7 x 2.1 cm. There is adenopathy in the  right supraclavicular and infraclavicular regions with extension of adenopathy into the right Peri carinal region. Largest individual lymph node in these areas measures 2.6 x 1.8 cm. Several left supraclavicular lymph nodes are also noted, largest measuring 1.7 x 1.3 cm. There are several subcentimeter mediastinal lymph nodes. There is extensive retrocrural adenopathy. The largest lymph node or collection of matted lymph nodes is seen inferior to the aorta on the left posteriorly measuring 3.4 x 2.1 cm. No esophageal lesions are evident. Lungs/Pleura: There is extensive airspace consolidation in portions of the right middle and lower lobes with interspersed areas of loculated pleural effusion. There is ill-defined airspace opacity consistent with pneumonia in the left upper lobe. Loculated fluid tracks along the left major fissure. There may well be associated metastasis within the fissure given the extensive nodularity in this area. There is a small left pleural effusion with left base atelectasis. Pleural metastases noted along each upper hemithorax, primarily posteriorly as well as in the right apex. Upper Abdomen: Retrocrural adenopathy extends into the upper abdominal region. Visualized upper abdominal structures otherwise appear unremarkable. Musculoskeletal: There is a large mass occupying the right breast and chest wall region with invasion of the deep  muscles of the chest wall on the right. There is also invasion of the right axilla. There is extensive subcutaneous thickening in both breast regions. There is a mass in the lateral left breast with extension of soft tissue prominence along the lateral left hemithorax, likely due to extension of neoplasm into the adjacent soft tissues. There is widespread sclerotic bony metastatic disease throughout the thoracic region. Review of the MIP images confirms the above findings. IMPRESSION: 1. No demonstrable pulmonary embolus. No thoracic aortic aneurysm or dissection. 2. Areas of airspace consolidation noted in the right middle and lower lobes. More ill-defined infiltrate is noted in the left upper lobe anteriorly. Areas of loculated fluid noted in the left major fissure. Nodularity in this area could indicate superimposed masses in left major fissure. There is pleural thickening along both posterior upper hemithorax regions, likely pleural metastases. Loculated fluid noted in areas of infiltrate on the right. Small left pleural effusion noted. 3. Adenopathy at multiple sites, most severe in the retrocrural regions bilaterally, right supraclavicular region, and right axillary region, although there is left axillary and left supraclavicular adenopathy. 4. Large mass lesions occupying the right breast with extension and invasion of the chest wall and right axillary regions. A lesser degree of metastatic disease to the left of midline in the subcutaneous tissues and lateral left breast/lateral left chest wall soft tissues noted. 5. Multifocal sclerotic bony metastatic disease throughout the thoracic region noted. Electronically Signed   By: Lowella Grip III M.D.   On: 10/07/2020 13:23   US Venous Img Upper Uni Right(DVT)  Result Date: 10/08/2020 CLINICAL DATA:  History of stage IV breast cancer now with right upper extremity pain and edema. Evaluate for DVT. EXAM: RIGHT UPPER EXTREMITY VENOUS DOPPLER ULTRASOUND  TECHNIQUE: Gray-scale sonography with graded compression, as well as color Doppler and duplex ultrasound were performed to evaluate the upper extremity deep venous system from the level of the subclavian vein and including the jugular, axillary, basilic, radial, ulnar and upper cephalic vein. Spectral Doppler was utilized to evaluate flow at rest and with distal augmentation maneuvers. COMPARISON:  None. FINDINGS: Contralateral Subclavian Vein: Respiratory phasicity is normal and symmetric with the symptomatic side. No evidence of thrombus. Normal compressibility. Internal Jugular Vein: No evidence of thrombus. Normal compressibility, respiratory phasicity and response  to augmentation. Subclavian Vein: No evidence of thrombus. Normal compressibility, respiratory phasicity and response to augmentation. Axillary Vein: Appears patent where imaged though evaluation degraded secondary to patient's discomfort with sonographic evaluation of the axilla. Cephalic Vein: No evidence of thrombus. Normal compressibility, respiratory phasicity and response to augmentation. Basilic Vein: No evidence of thrombus. Normal compressibility, respiratory phasicity and response to augmentation. Brachial Veins: No evidence of thrombus within either of the paired brachial veins. Normal compressibility, respiratory phasicity and response to augmentation. Radial Veins: No evidence of thrombus. Normal compressibility, respiratory phasicity and response to augmentation. Ulnar Veins: No evidence of thrombus. Normal compressibility, respiratory phasicity and response to augmentation. Venous Reflux:  None visualized. Other Findings: There is a moderate amount of subcutaneous edema at the level of the right upper arm. IMPRESSION: No evidence of DVT within the right upper extremity. Electronically Signed   By: Sandi Mariscal M.D.   On: 10/08/2020 14:29   DG Chest Port 1 View  Result Date: 10/15/2020 CLINICAL DATA:  Pneumonia and pleural effusion.  Metastatic breast cancer. EXAM: PORTABLE CHEST 1 VIEW COMPARISON:  10/13/2020 chest radiograph and prior studies FINDINGS: Cardiomediastinal silhouette is unchanged with RIGHT mediastinal fullness. A LEFT subclavian Port-A-Cath with tip overlying the UPPER RIGHT atrium again noted. Scattered opacities within both lungs are again noted with a RIGHT pleural effusion. There is no evidence of pneumothorax. No significant changes are identified. IMPRESSION: Unchanged appearance of the chest with scattered opacities within both lungs and RIGHT pleural effusion. Electronically Signed   By: Margarette Canada M.D.   On: 10/15/2020 11:06   DG Chest Port 1 View  Result Date: 10/13/2020 CLINICAL DATA:  Pleural effusion EXAM: PORTABLE CHEST 1 VIEW COMPARISON:  10/07/2020 FINDINGS: Unchanged positioning of left-sided Port-A-Cath. Stable cardiomediastinal contours. Persistent small right pleural effusion. Slight progression of airspace opacity throughout the right lung most pronounced within the right lung base. Similar interstitial opacities throughout the left lung. No pneumothorax. IMPRESSION: 1. Slight progression of airspace opacity throughout the right lung, most pronounced within the right lung base. 2. Persistent small right pleural effusion. Electronically Signed   By: Davina Poke D.O.   On: 10/13/2020 14:10   ECHOCARDIOGRAM COMPLETE  Result Date: 10/10/2020    ECHOCARDIOGRAM REPORT   Patient Name:   Jessica Willis Date of Exam: 10/10/2020 Medical Rec #:  [id number]           Height:       70.0 in Accession #:    [id number]          Weight:       178.7 lb Date of Birth:  Feb 01, 1966           BSA:          1.990 m Patient Age:    76 years            BP:           119/79 mmHg Patient Gender: F                   HR:           105 bpm. Exam Location:  [facility] Procedure: 2D Echo, Limited Color Doppler and Cardiac Doppler Indications:     R06.03 Acute Respiratory Distress  History:         Patient has no prior history of  Echocardiogram examinations.                  Signs/Symptoms:Shortness of Breath. Breast cancer.  Sonographer:     Charmayne Sheer RDCS (AE) Referring Phys:  [id number] Sharen Hones Diagnosing Phys: Kathlyn Sacramento MD  Sonographer Comments: Technically challenging study due to limited acoustic windows. IMPRESSIONS  1. Left ventricular ejection fraction, by estimation, is 50 to 55%. The left ventricle has low normal function. Left ventricular endocardial border not optimally defined to evaluate regional wall motion. Left ventricular diastolic parameters are indeterminate.  2. Right ventricular systolic function is normal. The right ventricular size is normal. Tricuspid regurgitation signal is inadequate for assessing PA pressure.  3. The mitral valve is normal in structure. No evidence of mitral valve regurgitation. No evidence of mitral stenosis.  4. The aortic valve is normal in structure. Aortic valve regurgitation is not visualized. No aortic stenosis is present.  5. The inferior vena cava is normal in size with greater than 50% respiratory variability, suggesting right atrial pressure of 3 mmHg. FINDINGS  Left Ventricle: Left ventricular ejection fraction, by estimation, is 50 to 55%. The left ventricle has low normal function. Left ventricular endocardial border not optimally defined to evaluate regional wall motion. The left ventricular internal cavity  size was normal in size. There is no left ventricular hypertrophy. Left ventricular diastolic parameters are indeterminate. Right Ventricle: The right ventricular size is normal. No increase in right ventricular wall thickness. Right ventricular systolic function is normal. Tricuspid regurgitation signal is inadequate for assessing PA pressure. Left Atrium: Left atrial size was normal in size. Right Atrium: Right atrial size was normal in size. Pericardium: There is no evidence of pericardial effusion. Mitral Valve: The mitral valve is normal in structure. No evidence of  mitral valve regurgitation. No evidence of mitral valve stenosis. MV peak gradient, 4.5 mmHg. The mean mitral valve gradient is 2.0 mmHg. Tricuspid Valve: The tricuspid valve is normal in structure. Tricuspid valve regurgitation is not demonstrated. No evidence of tricuspid stenosis. Aortic Valve: The aortic valve is normal in structure. Aortic valve regurgitation is not visualized. No aortic stenosis is present. Aortic valve mean gradient measures 4.0 mmHg. Aortic valve peak gradient measures 6.8 mmHg. Pulmonic Valve: The pulmonic valve was not well visualized. Pulmonic valve regurgitation is not visualized. No evidence of pulmonic stenosis. Aorta: The aortic root is normal in size and structure. Venous: The inferior vena cava is normal in size with greater than 50% respiratory variability, suggesting right atrial pressure of 3 mmHg. IAS/Shunts: No atrial level shunt detected by color flow Doppler.   LV Volumes (MOD) LV vol d, MOD A4C: 99.4 ml Diastology LV vol s, MOD A4C: 58.3 ml LV e' lateral:   10.30 cm/s LV SV MOD A4C:     99.4 ml LV E/e' lateral: 5.8  LEFT ATRIUM           Index LA Vol (A4C): 41.5 ml 20.86 ml/m  AORTIC VALVE AV Vmax:           130.00 cm/s AV Vmean:          93.400 cm/s AV VTI:            0.243 m AV Peak Grad:      6.8 mmHg AV Mean Grad:      4.0 mmHg LVOT Vmax:         92.70 cm/s LVOT Vmean:        60.500 cm/s LVOT VTI:          0.151 m LVOT/AV VTI ratio: 0.62 MITRAL VALVE MV Area (PHT): 6.96 cm    SHUNTS MV Peak grad:  4.5 mmHg    Systemic VTI: 0.15 m MV Mean grad:  2.0 mmHg MV Vmax:       1.06 m/s MV Vmean:      67.9 cm/s MV Decel Time: 109 msec MV E velocity: 59.60 cm/s MV A velocity: 68.60 cm/s MV E/A ratio:  0.87 Kathlyn Sacramento MD Electronically signed by Kathlyn Sacramento MD Signature Date/Time: 10/10/2020/2:00:06 PM    Final      Subjective: Patient was seen and examined today.  Denies any new complaint.  We discussed about the importance of good nutrition and using nutritional  supplements which include Ensure and boost to supplement her intake so she will not keep losing weight.  Discharge Exam: Vitals:   10/21/20 0701 10/21/20 0807  BP:  109/65  Pulse:  (!) 102  Resp:  14  Temp:  98.6 F (37 C)  SpO2: 100% 100%   Vitals:   10/21/20 0024 10/21/20 0313 10/21/20 0701 10/21/20 0807  BP:  107/61  109/65  Pulse: (!) 108 (!) 107  (!) 102  Resp:  18  14  Temp:  (!) 97.3 F (36.3 C)  98.6 F (37 C)  TempSrc:  Oral  Oral  SpO2: 99% 98% 100% 100%  Weight:      Height:        General: Pt is alert, awake, not in acute distress Cardiovascular: RRR, S1/S2 +, no rubs, no gallops Respiratory: CTA bilaterally, no wheezing, no rhonchi Abdominal: Soft, NT, ND, bowel sounds + Extremities: no edema, no cyanosis   The results of significant diagnostics from this hospitalization (including imaging, microbiology, ancillary and laboratory) are listed below for reference.    Microbiology: No results found for this or any previous visit (from the past 240 hour(s)).   Labs: BNP (last 3 results) Recent Labs    06/15/20 2125 10/09/20 0518  BNP 49.6 034.7*   Basic Metabolic Panel: Recent Labs  Lab 10/15/20 0608 10/16/20 0424 10/17/20 0834 10/18/20 0647 10/19/20 0455 10/20/20 0615 10/21/20 0613  NA 143   < > 142 142 142 144 143  K 4.3   < > 4.5 4.5 4.3 3.9 3.4*  CL 99   < > 98 97* 98 99 100  CO2 32   < > 34* 35* 34* 34* 34*  GLUCOSE 128*   < > 104* 94 98 96 82  BUN 22*   < > 26* 23* 24* 26* 25*  CREATININE 0.42*   < > 0.36* 0.45 0.49 0.37* 0.35*  CALCIUM 8.5*   < > 8.3* 8.3* 8.4* 8.4* 8.1*  MG 2.5*  --   --  2.6*  --  2.3  --   PHOS 2.8  --   --  2.8  --  3.4  --    < > = values in this interval not displayed.   Liver Function Tests: Recent Labs  Lab 10/15/20 0608  ALBUMIN 2.6*   No results for input(s): LIPASE, AMYLASE in the last 168 hours. No results for input(s): AMMONIA in the last 168 hours. CBC: Recent Labs  Lab 10/16/20 1642  10/18/20 0647 10/20/20 0615 10/21/20 0613  WBC 20.0* 20.4* 18.8* 12.6*  NEUTROABS 18.1* 19.4*  --  11.0*  HGB 8.9* 9.1* 8.7* 8.7*  HCT 31.3* 30.9* 29.7* 29.9*  MCV 86.5 85.1 86.6 85.4  PLT 354 351 319 263   Cardiac Enzymes: No results for input(s): CKTOTAL, CKMB, CKMBINDEX, TROPONINI in the last 168 hours. BNP: Invalid input(s): POCBNP CBG: No results for input(s):  GLUCAP in the last 168 hours. D-Dimer No results for input(s): DDIMER in the last 72 hours. Hgb A1c No results for input(s): HGBA1C in the last 72 hours. Lipid Profile No results for input(s): CHOL, HDL, LDLCALC, TRIG, CHOLHDL, LDLDIRECT in the last 72 hours. Thyroid function studies No results for input(s): TSH, T4TOTAL, T3FREE, THYROIDAB in the last 72 hours.  Invalid input(s): FREET3 Anemia work up No results for input(s): VITAMINB12, FOLATE, FERRITIN, TIBC, IRON, RETICCTPCT in the last 72 hours. Urinalysis    Component Value Date/Time   COLORURINE AMBER (A) 10/20/2020 1054   APPEARANCEUR TURBID (A) 10/20/2020 1054   LABSPEC 1.026 10/20/2020 1054   PHURINE 5.0 10/20/2020 1054   GLUCOSEU NEGATIVE 10/20/2020 1054   HGBUR NEGATIVE 10/20/2020 1054   BILIRUBINUR NEGATIVE 10/20/2020 1054   KETONESUR NEGATIVE 10/20/2020 1054   PROTEINUR NEGATIVE 10/20/2020 1054   NITRITE NEGATIVE 10/20/2020 1054   LEUKOCYTESUR NEGATIVE 10/20/2020 1054   Sepsis Labs Invalid input(s): PROCALCITONIN,  WBC,  LACTICIDVEN Microbiology No results found for this or any previous visit (from the past 240 hour(s)).  Time coordinating discharge: Over 30 minutes  SIGNED:  Lorella Nimrod, MD  [facility] 10/21/2020, 11:37 AM  If 7PM-7AM, please contact night-coverage [email or web address]  This record has been created using Systems analyst. Errors have been sought and corrected,but may not always be located. Such creation errors do not reflect on the standard of care.

## 2020-10-21 NOTE — Plan of Care (Signed)
Pt alert and oriented on 3L [place], dressing changed on the R breast per order. Pt did not void this shift, no sample sent to lab. Bladder scan 372cc. Falls precautions remained in place, call bell within reach Problem: Coping: Goal: Level of anxiety will decrease Outcome: Progressing   Problem: Pain Managment: Goal: General experience of comfort will improve Outcome: Progressing   Problem: Safety: Goal: Ability to remain free from injury will improve Outcome: Progressing   Problem: Skin Integrity: Goal: Risk for impaired skin integrity will decrease Outcome: Progressing

## 2020-10-21 NOTE — Progress Notes (Signed)
Pulmonary Medicine          Date: 10/21/2020,   MRN# [id number] Jessica Willis 05/06/1966     AdmissionWeight: 77.6 kg                 CurrentWeight: 81.2 kg   Referring physician: Dr Tasia Catchings   CHIEF COMPLAINT:      HISTORY OF PRESENT ILLNESS   Jessica Willis is a 55 y.o. female with medical history significant of stage IV metastasized breast cancer on chemotherapy, tachycardia, GERD, anemia, malignant pleural effusion, hemothorax, who presents with shortness of breath. In ER initally presentedwith cough and sob. She had massive rt pleural effusion with collapse of rt lung . She also had rt breast mass with changes in skin color for  6 months . She underwent thoracenteiss and it was metastatic adenocarcinoma. MRI of the brain showed scattered metastatic implants upper cervical spine.  She had a port placement and also underwent incisional biopsy of the rt breast mass on 06/28/20.  She got a dose of taxol while inpatient and was discharged on 07/01/20. She followed up with oncologist as OP, on palliative chemotherapy with weekly taxol.  Has been having tachycardia and SOB- saw Cardiology on 09/26/20 Prolonged QT and tachycardia. Pt was found to have WBC 22.3, negative Covid PCR, lactic acid 2.3, INR 1.3, PTT 36, electrolytes renal function okay, temperature 99.5, blood pressure 138/80, heart rate 134, RR 20, oxygen saturation 87% on room air, which improved her to 100% on 2 L oxygen. PCCM consultation placed due to complicated respiratory status with metastatic lung cancer and large malignant effusion. CT chest below shows pleural effusion bilaterally worse on right with metastatic lesions visible on bony spine from thoracic spine down and significant hilar and mediastinal lymphadneopathy suggestive of widespread mets.   10/13/20- patient reports improved breathing, weaned O2 to 4L [place].  She is diuresing well which ishelping her breathing.  She responded well to steroids,  inflammatory markers are elevated likely due to active malignancy and CAP(although most of this is likely fluid and unclear how much is actual infection).  She feels strong enough to get up and participate with PT.  She is using IS and able to take tidal volume 500cc today.  I have encouraged to use every hour. I met with son and reviewed care plan.   10/14/20-  Patient had onc tx today. She felt onset of SOB few hours after coming back however vitals stable.   10/15/20- patient had incresed O2 req this am to 5L/min.  I will repeat CXR today.  She was able to eat this am, states overnight she felt worsening SOB post therapy yesterday. Shes making adequate urine overnight, will keep lasix at 40.  Will dc albumin as this can sometimes cause pulm edema and will keep low dose bid solumedrol 20 for now.   10/16/20-  Patient weaned to 4L/min [place]. Reviewed CXR with improved left side still significant airspace and interstitial opacification on right. She had documented acute episode of resp distress with spO2 74%   10/17/20- patient resting in bed in no distress. She is on 4-5L/mn [place].  Lasix 40 BID with albumin infusing but patient remains with plateau in terms of improvement over past 24-48h.  Continue MEtaneb. PT/OT   10/18/20- despite >2L diuresis overnight patient actually seems to have increased O2 req hower spO2 100% she may be able towean more.  She reports improvement and has no pain or muscle cramping.  She tolerates  BID lasix well with normal renal function today. Plan to continue current scope of therapy from pulm perspective and will consider repeat CT chest for interval changes.    10/19/20- patient is down to 3L/min.  Oral mucosa is dry and there is oral thrush. Will start Diflucan today and magic mouth wash.   10/20/20- patient on 3L again today.  [name] is improving. Her urine is green with sedimentation. Will obtain UA and urine sediments.  PAST MEDICAL HISTORY   Past Medical History:  Diagnosis Date   . Anemia   . Family history of breast cancer   . Patient denies medical problems      SURGICAL HISTORY   Past Surgical History:  Procedure Laterality Date  . BREAST BIOPSY  06/28/2020   Procedure: BREAST BIOPSY;  Surgeon: Benjamine Sprague, DO;  Location: [facility] ORS;  Service: General;;  . PORTACATH PLACEMENT N/A 06/28/2020   Procedure: INSERTION PORT-A-CATH;  Surgeon: Benjamine Sprague, DO;  Location: [facility] ORS;  Service: General;  Laterality: N/A;  . TUBAL LIGATION    . VIDEO ASSISTED THORACOSCOPY (VATS)/THOROCOTOMY Right 06/23/2020   Procedure: ATTEMPTED VIDEO ASSISTED THORACOSCOPY (VATS);  Surgeon: Nestor Lewandowsky, MD;  Location: [facility] ORS;  Service: General;  Laterality: Right;     FAMILY HISTORY   Family History  Problem Relation Age of Onset  . Diabetes Other   . Hypertension Other   . Diabetes Maternal Aunt   . Breast cancer Cousin        dx 65s  . Breast cancer Cousin        dx 22s     SOCIAL HISTORY   Social History   Tobacco Use  . Smoking status: Never Smoker  . Smokeless tobacco: Never Used  Vaping Use  . Vaping Use: Never used  Substance Use Topics  . Alcohol use: Not Currently    Alcohol/week: 1.0 standard drink    Types: 1 Cans of beer per week  . Drug use: No     MEDICATIONS    Home Medication:    Current Medication:  Current Facility-Administered Medications:  .  acetaminophen (TYLENOL) tablet 650 mg, 650 mg, Oral, Q6H PRN, Ivor Costa, MD, 650 mg at 10/09/20 2144 .  albumin human 25 % solution 12.5 g, 12.5 g, Intravenous, Daily, Aleskerov, Fuad, MD, Last Rate: 60 mL/hr at 10/21/20 1248, 12.5 g at 10/21/20 1248 .  albuterol (PROVENTIL) (2.5 MG/3ML) 0.083% nebulizer solution 2.5 mg, 2.5 mg, Nebulization, Q6H PRN, Samuella Cota, MD, 2.5 mg at 10/16/20 1612 .  albuterol (VENTOLIN HFA) 108 (90 Base) MCG/ACT inhaler 2 puff, 2 puff, Inhalation, Q4H PRN, Ivor Costa, MD .  bisacodyl (DULCOLAX) suppository 10 mg, 10 mg, Rectal, Daily PRN, Samuella Cota, MD .  calcium carbonate (OS-CAL - dosed in mg of elemental calcium) tablet 1,250 mg, 1,250 mg, Oral, Q breakfast, Ivor Costa, MD, 1,250 mg at 10/20/20 1015 .  Chlorhexidine Gluconate Cloth 2 % PADS 6 each, 6 each, Topical, Daily, Sharen Hones, MD, 6 each at 10/21/20 1043 .  dextromethorphan-guaiFENesin (MUCINEX DM) 30-600 MG per 12 hr tablet 1 tablet, 1 tablet, Oral, BID PRN, Ivor Costa, MD, 1 tablet at 10/19/20 0907 .  senna (SENOKOT) tablet 17.2 mg, 2 tablet, Oral, BID, 17.2 mg at 10/18/20 1026 **AND** docusate sodium (COLACE) capsule 100 mg, 100 mg, Oral, BID, Samuella Cota, MD, 100 mg at 10/18/20 1026 .  dronabinol (MARINOL) capsule 5 mg, 5 mg, Oral, BID AC, Earlie Server, MD, 5 mg at 10/21/20 1041 .  enoxaparin (LOVENOX) injection 40 mg, 40 mg, Subcutaneous, Q24H, Ivor Costa, MD, 40 mg at 10/20/20 2300 .  feeding supplement (ENSURE ENLIVE / ENSURE PLUS) liquid 237 mL, 237 mL, Oral, QID, Lorella Nimrod, MD, 237 mL at 10/21/20 1043 .  ferrous sulfate tablet 325 mg, 325 mg, Oral, BID WC, Ivor Costa, MD, 325 mg at 10/21/20 1042 .  [COMPLETED] fluconazole (DIFLUCAN) tablet 200 mg, 200 mg, Oral, Once, 200 mg at 10/20/20 1403 **FOLLOWED BY** fluconazole (DIFLUCAN) tablet 100 mg, 100 mg, Oral, Daily, Amin, Soundra Pilon, MD, 100 mg at 10/21/20 1044 .  furosemide (LASIX) injection 40 mg, 40 mg, Intravenous, Daily, Lanney Gins, Fuad, MD, 40 mg at 10/21/20 1041 .  heparin lock flush 100 unit/mL, 500 Units, Intravenous, Once, Lorella Nimrod, MD .  lidocaine-prilocaine (EMLA) cream 1 application, 1 application, Topical, PRN, Ivor Costa, MD .  magic mouthwash w/lidocaine, 15 mL, Oral, TID, Lorella Nimrod, MD, 15 mL at 10/21/20 1249 .  magnesium chloride (SLOW-MAG) 64 MG SR tablet 64 mg, 1 tablet, Oral, Daily, Ivor Costa, MD, 64 mg at 10/21/20 1249 .  melatonin tablet 5 mg, 5 mg, Oral, QHS, Sharen Hones, MD, 5 mg at 10/20/20 2300 .  metoprolol tartrate (LOPRESSOR) tablet 50 mg, 50 mg, Oral, BID, Ivor Costa, MD, 50 mg  at 10/21/20 1042 .  multivitamin with minerals tablet 1 tablet, 1 tablet, Oral, Daily, Samuella Cota, MD, 1 tablet at 10/21/20 1042 .  ondansetron (ZOFRAN) injection 4 mg, 4 mg, Intravenous, Q8H PRN, Ivor Costa, MD, 4 mg at 10/17/20 1309 .  polyethylene glycol (MIRALAX / GLYCOLAX) packet 17 g, 17 g, Oral, BID, Samuella Cota, MD, 17 g at 10/18/20 1024 .  potassium chloride SA (KLOR-CON) CR tablet 20 mEq, 20 mEq, Oral, Daily, Earlie Server, MD, 20 mEq at 10/21/20 1041 .  predniSONE (DELTASONE) tablet 50 mg, 50 mg, Oral, Q breakfast, Aleskerov, Fuad, MD, 50 mg at 10/21/20 1042 .  scopolamine (TRANSDERM-SCOP) 1 MG/3DAYS 1.5 mg, 1 patch, Transdermal, Q72H, Earlie Server, MD, 1.5 mg at 10/20/20 1404 .  simethicone (MYLICON) chewable tablet 80 mg, 80 mg, Oral, Daily PRN, Earlie Server, MD, 80 mg at 10/15/20 1002 .  sodium chloride (OCEAN) 0.65 % nasal spray 1 spray, 1 spray, Each Nare, PRN, Samuella Cota, MD .  sodium chloride flush (NS) 0.9 % injection 10-40 mL, 10-40 mL, Intracatheter, Q12H, Sharen Hones, MD, 10 mL at 10/21/20 1044 .  sodium chloride flush (NS) 0.9 % injection 10-40 mL, 10-40 mL, Intracatheter, PRN, Sharen Hones, MD, 10 mL at 10/10/20 1447    ALLERGIES   Patient has no known allergies.     REVIEW OF SYSTEMS    Review of Systems:  Gen:  Denies  fever, sweats, chills weigh loss  HEENT: Denies blurred vision, double vision, ear pain, eye pain, hearing loss, nose bleeds, sore throat Cardiac:  No dizziness, chest pain or heaviness, chest tightness,edema Resp:   Denies cough or sputum porduction, shortness of breath,wheezing, hemoptysis,  Gi: Denies swallowing difficulty, stomach pain, nausea or vomiting, diarrhea, constipation, bowel incontinence Gu:  Denies bladder incontinence, burning urine Ext:   Denies Joint pain, stiffness or swelling Skin: Denies  skin rash, easy bruising or bleeding or hives Endoc:  Denies polyuria, polydipsia , polyphagia or weight change Psych:    Denies depression, insomnia or hallucinations   Other:  All other systems negative   VS: BP 109/64 (BP Location: Left Arm)   Pulse (!) 110   Temp 98.6 F (37 C) (Oral)  Resp 16   Ht 5\' 10"  (1.778 m)   Wt 81.2 kg   LMP 06/16/2015 Comment: Tubal Ligation   SpO2 99%   BMI 25.68 kg/m      PHYSICAL EXAM    GENERAL:NAD, no fevers, chills, no weakness no fatigue HEAD: Normocephalic, atraumatic.  EYES: Pupils equal, round, reactive to light. Extraocular muscles intact. No scleral icterus.  MOUTH: dry oral mucosa. Dentition intact. No abscess noted.  EAR, NOSE, THROAT: Clear without exudates. No external lesions.  NECK: Supple. No thyromegaly. No nodules. No JVD.  PULMONARY: mild rhonchi bilatarally CARDIOVASCULAR: S1 and S2. Regular rate and rhythm. No murmurs, rubs, or gallops. No edema. Pedal pulses 2+ bilaterally.  GASTROINTESTINAL: Soft, nontender, nondistended. No masses. Positive bowel sounds. No hepatosplenomegaly.  MUSCULOSKELETAL: No swelling, clubbing, or edema. Range of motion full in all extremities.  NEUROLOGIC: Cranial nerves II through XII are intact. No gross focal neurological deficits. Sensation intact. Reflexes intact.  SKIN: No ulceration, lesions, rashes, or cyanosis. Skin warm and dry. Turgor intact.  PSYCHIATRIC: Mood, affect within normal limits. The patient is awake, alert and oriented x 3. Insight, judgment intact.       IMAGING    DG Chest 2 View  Result Date: 10/07/2020 CLINICAL DATA:  Hypoxia, shortness of breath, metastatic breast cancer EXAM: CHEST - 2 VIEW COMPARISON:  09/13/2020 FINDINGS: LEFT subclavian Port-A-Cath with tip projecting over RIGHT atrium. Enlargement of cardiac silhouette. Pulmonary vascular congestion. Persistent enlargement of RIGHT hilum question mass or adenopathy. Increased infiltrate of the mid to lower RIGHT lung. Small RIGHT pleural effusion again seen. Accentuation of markings in LEFT perihilar region little changed. No  pneumothorax or acute osseous findings. IMPRESSION: Increased RIGHT perihilar infiltrate with persistent small RIGHT pleural effusion. Enlargement of RIGHT pulmonary hilum by adenopathy versus perihilar mass. Electronically Signed   By: 11/11/2020 M.D.   On: 10/07/2020 11:32   CT Angio Chest PE W and/or Wo Contrast  Result Date: 10/07/2020 CLINICAL DATA:  Breast carcinoma. Tachycardia and shortness of breath EXAM: CT ANGIOGRAPHY CHEST WITH CONTRAST TECHNIQUE: Multidetector CT imaging of the chest was performed using the standard protocol during bolus administration of intravenous contrast. Multiplanar CT image reconstructions and MIPs were obtained to evaluate the vascular anatomy. CONTRAST:  67mL OMNIPAQUE IOHEXOL 350 MG/ML SOLN COMPARISON:  CT angiogram chest June 15, 2020; chest radiograph October 07, 2020 FINDINGS: Cardiovascular: There is no evident pulmonary embolus. No appreciable thoracic aortic aneurysm or dissection. Visualized great vessels appear unremarkable. There is no appreciable pericardial effusion or pericardial thickening. Port-A-Cath tip is in the superior vena cava near the cavoatrial junction. Mediastinum/Nodes: Thyroid appears normal. There are multiple axillary lymph nodes bilaterally, more severe on the right than on the left. The largest lymph node is seen in the right axillary region measuring 2.7 x 2.1 cm. There is adenopathy in the right supraclavicular and infraclavicular regions with extension of adenopathy into the right Peri carinal region. Largest individual lymph node in these areas measures 2.6 x 1.8 cm. Several left supraclavicular lymph nodes are also noted, largest measuring 1.7 x 1.3 cm. There are several subcentimeter mediastinal lymph nodes. There is extensive retrocrural adenopathy. The largest lymph node or collection of matted lymph nodes is seen inferior to the aorta on the left posteriorly measuring 3.4 x 2.1 cm. No esophageal lesions are evident.  Lungs/Pleura: There is extensive airspace consolidation in portions of the right middle and lower lobes with interspersed areas of loculated pleural effusion. There is ill-defined airspace opacity consistent  with pneumonia in the left upper lobe. Loculated fluid tracks along the left major fissure. There may well be associated metastasis within the fissure given the extensive nodularity in this area. There is a small left pleural effusion with left base atelectasis. Pleural metastases noted along each upper hemithorax, primarily posteriorly as well as in the right apex. Upper Abdomen: Retrocrural adenopathy extends into the upper abdominal region. Visualized upper abdominal structures otherwise appear unremarkable. Musculoskeletal: There is a large mass occupying the right breast and chest wall region with invasion of the deep muscles of the chest wall on the right. There is also invasion of the right axilla. There is extensive subcutaneous thickening in both breast regions. There is a mass in the lateral left breast with extension of soft tissue prominence along the lateral left hemithorax, likely due to extension of neoplasm into the adjacent soft tissues. There is widespread sclerotic bony metastatic disease throughout the thoracic region. Review of the MIP images confirms the above findings. IMPRESSION: 1. No demonstrable pulmonary embolus. No thoracic aortic aneurysm or dissection. 2. Areas of airspace consolidation noted in the right middle and lower lobes. More ill-defined infiltrate is noted in the left upper lobe anteriorly. Areas of loculated fluid noted in the left major fissure. Nodularity in this area could indicate superimposed masses in left major fissure. There is pleural thickening along both posterior upper hemithorax regions, likely pleural metastases. Loculated fluid noted in areas of infiltrate on the right. Small left pleural effusion noted. 3. Adenopathy at multiple sites, most severe in the  retrocrural regions bilaterally, right supraclavicular region, and right axillary region, although there is left axillary and left supraclavicular adenopathy. 4. Large mass lesions occupying the right breast with extension and invasion of the chest wall and right axillary regions. A lesser degree of metastatic disease to the left of midline in the subcutaneous tissues and lateral left breast/lateral left chest wall soft tissues noted. 5. Multifocal sclerotic bony metastatic disease throughout the thoracic region noted. Electronically Signed   By: Lowella Grip III M.D.   On: 10/07/2020 13:23   US Venous Img Upper Uni Right(DVT)  Result Date: 10/08/2020 CLINICAL DATA:  History of stage IV breast cancer now with right upper extremity pain and edema. Evaluate for DVT. EXAM: RIGHT UPPER EXTREMITY VENOUS DOPPLER ULTRASOUND TECHNIQUE: Gray-scale sonography with graded compression, as well as color Doppler and duplex ultrasound were performed to evaluate the upper extremity deep venous system from the level of the subclavian vein and including the jugular, axillary, basilic, radial, ulnar and upper cephalic vein. Spectral Doppler was utilized to evaluate flow at rest and with distal augmentation maneuvers. COMPARISON:  None. FINDINGS: Contralateral Subclavian Vein: Respiratory phasicity is normal and symmetric with the symptomatic side. No evidence of thrombus. Normal compressibility. Internal Jugular Vein: No evidence of thrombus. Normal compressibility, respiratory phasicity and response to augmentation. Subclavian Vein: No evidence of thrombus. Normal compressibility, respiratory phasicity and response to augmentation. Axillary Vein: Appears patent where imaged though evaluation degraded secondary to patient's discomfort with sonographic evaluation of the axilla. Cephalic Vein: No evidence of thrombus. Normal compressibility, respiratory phasicity and response to augmentation. Basilic Vein: No evidence of  thrombus. Normal compressibility, respiratory phasicity and response to augmentation. Brachial Veins: No evidence of thrombus within either of the paired brachial veins. Normal compressibility, respiratory phasicity and response to augmentation. Radial Veins: No evidence of thrombus. Normal compressibility, respiratory phasicity and response to augmentation. Ulnar Veins: No evidence of thrombus. Normal compressibility, respiratory phasicity and response to  augmentation. Venous Reflux:  None visualized. Other Findings: There is a moderate amount of subcutaneous edema at the level of the right upper arm. IMPRESSION: No evidence of DVT within the right upper extremity. Electronically Signed   By: Simonne Come M.D.   On: 10/08/2020 14:29   DG Chest Port 1 View  Result Date: 10/15/2020 CLINICAL DATA:  Pneumonia and pleural effusion. Metastatic breast cancer. EXAM: PORTABLE CHEST 1 VIEW COMPARISON:  10/13/2020 chest radiograph and prior studies FINDINGS: Cardiomediastinal silhouette is unchanged with RIGHT mediastinal fullness. A LEFT subclavian Port-A-Cath with tip overlying the UPPER RIGHT atrium again noted. Scattered opacities within both lungs are again noted with a RIGHT pleural effusion. There is no evidence of pneumothorax. No significant changes are identified. IMPRESSION: Unchanged appearance of the chest with scattered opacities within both lungs and RIGHT pleural effusion. Electronically Signed   By: Harmon Pier M.D.   On: 10/15/2020 11:06   DG Chest Port 1 View  Result Date: 10/13/2020 CLINICAL DATA:  Pleural effusion EXAM: PORTABLE CHEST 1 VIEW COMPARISON:  10/07/2020 FINDINGS: Unchanged positioning of left-sided Port-A-Cath. Stable cardiomediastinal contours. Persistent small right pleural effusion. Slight progression of airspace opacity throughout the right lung most pronounced within the right lung base. Similar interstitial opacities throughout the left lung. No pneumothorax. IMPRESSION: 1. Slight  progression of airspace opacity throughout the right lung, most pronounced within the right lung base. 2. Persistent small right pleural effusion. Electronically Signed   By: Duanne Guess D.O.   On: 10/13/2020 14:10   ECHOCARDIOGRAM COMPLETE  Result Date: 10/10/2020    ECHOCARDIOGRAM REPORT   Patient Name:   KAHLEE METIVIER Date of Exam: 10/10/2020 Medical Rec #:  [id number]           Height:       70.0 in Accession #:    [id number]          Weight:       178.7 lb Date of Birth:  [birth year]           BSA:          1.990 m Patient Age:    54 years            BP:           119/79 mmHg Patient Gender: F                   HR:           105 bpm. Exam Location:  [facility] Procedure: 2D Echo, Limited Color Doppler and Cardiac Doppler Indications:     R06.03 Acute Respiratory Distress  History:         Patient has no prior history of Echocardiogram examinations.                  Signs/Symptoms:Shortness of Breath. Breast cancer.  Sonographer:     Humphrey Rolls RDCS (AE) Referring Phys:  [id number] Marrion Coy Diagnosing Phys: Lorine Bears MD  Sonographer Comments: Technically challenging study due to limited acoustic windows. IMPRESSIONS  1. Left ventricular ejection fraction, by estimation, is 50 to 55%. The left ventricle has low normal function. Left ventricular endocardial border not optimally defined to evaluate regional wall motion. Left ventricular diastolic parameters are indeterminate.  2. Right ventricular systolic function is normal. The right ventricular size is normal. Tricuspid regurgitation signal is inadequate for assessing PA pressure.  3. The mitral valve is normal in structure. No evidence of mitral valve regurgitation. No evidence of mitral stenosis.  4. The aortic valve is normal in structure. Aortic valve regurgitation is not visualized. No aortic stenosis is present.  5. The inferior vena cava is normal in size with greater than 50% respiratory variability, suggesting right atrial pressure of 3 mmHg.  FINDINGS  Left Ventricle: Left ventricular ejection fraction, by estimation, is 50 to 55%. The left ventricle has low normal function. Left ventricular endocardial border not optimally defined to evaluate regional wall motion. The left ventricular internal cavity  size was normal in size. There is no left ventricular hypertrophy. Left ventricular diastolic parameters are indeterminate. Right Ventricle: The right ventricular size is normal. No increase in right ventricular wall thickness. Right ventricular systolic function is normal. Tricuspid regurgitation signal is inadequate for assessing PA pressure. Left Atrium: Left atrial size was normal in size. Right Atrium: Right atrial size was normal in size. Pericardium: There is no evidence of pericardial effusion. Mitral Valve: The mitral valve is normal in structure. No evidence of mitral valve regurgitation. No evidence of mitral valve stenosis. MV peak gradient, 4.5 mmHg. The mean mitral valve gradient is 2.0 mmHg. Tricuspid Valve: The tricuspid valve is normal in structure. Tricuspid valve regurgitation is not demonstrated. No evidence of tricuspid stenosis. Aortic Valve: The aortic valve is normal in structure. Aortic valve regurgitation is not visualized. No aortic stenosis is present. Aortic valve mean gradient measures 4.0 mmHg. Aortic valve peak gradient measures 6.8 mmHg. Pulmonic Valve: The pulmonic valve was not well visualized. Pulmonic valve regurgitation is not visualized. No evidence of pulmonic stenosis. Aorta: The aortic root is normal in size and structure. Venous: The inferior vena cava is normal in size with greater than 50% respiratory variability, suggesting right atrial pressure of 3 mmHg. IAS/Shunts: No atrial level shunt detected by color flow Doppler.   LV Volumes (MOD) LV vol d, MOD A4C: 99.4 ml Diastology LV vol s, MOD A4C: 58.3 ml LV e' lateral:   10.30 cm/s LV SV MOD A4C:     99.4 ml LV E/e' lateral: 5.8  LEFT ATRIUM           Index LA  Vol (A4C): 41.5 ml 20.86 ml/m  AORTIC VALVE AV Vmax:           130.00 cm/s AV Vmean:          93.400 cm/s AV VTI:            0.243 m AV Peak Grad:      6.8 mmHg AV Mean Grad:      4.0 mmHg LVOT Vmax:         92.70 cm/s LVOT Vmean:        60.500 cm/s LVOT VTI:          0.151 m LVOT/AV VTI ratio: 0.62 MITRAL VALVE MV Area (PHT): 6.96 cm    SHUNTS MV Peak grad:  4.5 mmHg    Systemic VTI: 0.15 m MV Mean grad:  2.0 mmHg MV Vmax:       1.06 m/s MV Vmean:      67.9 cm/s MV Decel Time: 109 msec MV E velocity: 59.60 cm/s MV A velocity: 68.60 cm/s MV E/A ratio:  0.87 Kathlyn Sacramento MD Electronically signed by Kathlyn Sacramento MD Signature Date/Time: 10/10/2020/2:00:06 PM    Final          ASSESSMENT/PLAN   Malignant pleural effusion    - since patient had initial therapeutic response to thoracentesis she will likely benefit from permament tunneled pleural catheter via IR service as palliative  option for malignant effusion due to high probability of rapid re-accumulation    -there is no renal failure, hepatic synthetic function within reference range without HASH/cirhosis, no CHF per most recent TTE   - no lymphadema   Metastatic lung cancer   -Currently being followed by oncology Dr Tasia Catchings - appreciate input     Compressive atelectasis   Worse at Right lowe lobe - patient is weak and unlikely to be able to perform well with incentive spirometry.  She is a good candidate for albuterol infused Metaneb therapy can do BID to help with recruitment.     Chronic deconditioning and severe protein calorie malnutrition     -nutritional and PT evaluation     -palliative care on case - appreciate input     -bitemporal wasting noted on examination     - high risk refeeding syndrome - monitor electorlytes - will place consult for pharmD and RD     Acute hypoxemic respiratory failure    - patient generally not on supplemental O2 currently on 7L/min supplemental O2>>4>>5- 10/15/20>>3L/min     - she has leukocytosis  but gram stain is negative on pleural fluid and there is no obvious infiltrate although basis bilaterally may have pneumonia, this is obscured by effusion and atelectatic sement.  Legionella and strep pneumo ag is negative. I feel that CAP is less likely.  Have ordered iflammatory markers and PCT.      - lasix to help with residual effusion     -Soluedrol 20 bid IV>>prednisone taper 10/18/20     - lasix 40 iv daily >>bid 10/16/20     - albumin $RemoveB'50mg'[id number]$  25% 1 amd daily with diuresis     - chest physitherapy to recruit atelectatic lungs     -etiology is all of the above conditions     - TTE with no findings to suggest CHF realted hypoxemia  -repeat cxr 10/15/20- reviewed     Thank you for allowing me to participate in the care of this patient.   Patient/Family are satisfied with care plan and all questions have been answered.  This document was prepared using Dragon voice recognition software and may include unintentional dictation errors.     Ottie Glazier, M.D.  Division of [facility]

## 2020-10-21 NOTE — Consult Note (Signed)
PHARMACY CONSULT NOTE  Pharmacy Consult for Electrolyte Monitoring and Replacement   Recent Labs: Potassium (mmol/L)  Date Value  10/20/2020 3.9   Magnesium (mg/dL)  Date Value  10/20/2020 2.3   Calcium (mg/dL)  Date Value  10/20/2020 8.4 (L)   Albumin (g/dL)  Date Value  10/15/2020 2.6 (L)   Phosphorus (mg/dL)  Date Value  10/20/2020 3.4   Sodium (mmol/L)  Date Value  10/20/2020 144   Assessment: Patient is a 55 y/o F with medical history including metastatic breast cancer on chemotherapy, tachycardia, anemia, GERD, malignant pleural effusion, hemothorax who was admitted 2/25 with respiratory failure secondary to pneumonia versus worsening metastatic disease. Pharmacy consulted to assist with electrolyte monitoring and replacement as indicated.   Per chart review, appetite appears poor.   Diuresis: IV Lasix 40 BID > QD  3/11 labs:  Na: 144>143 (stable) K: 3.9>3.4 (downtrend) Mg: 2.6>2.3> (NNL check QoD) Phos: 2.8>3.4> (NNL check QoD) Ca: 8.4>8.1 (albumin: 2.6) -- corrected 9.1 (stable) Scr0.37>0.35  Goal of Therapy:  Electrolytes within normal limits  Plan:  - Continues on lasix 40 IV QD, Mag PO 64mg  daily, Calcium daily,  KCL 20 meq PO daily - K: 3.9>3.4 today; will add supplemental dose of 50meq KCL PO x1 in addition to daily scheduled dose as above, separate doses by at least 2hrs.  --Will follow-up electrolytes with AM labs. f/u Mag/phos every other day as has been stable  Lorna Dibble 10/21/2020 8:24 AM

## 2020-10-21 NOTE — Progress Notes (Signed)
Pulmonary Medicine          Date: 10/21/2020,   MRN# [id number] Jessica Willis 05/06/1966     AdmissionWeight: 77.6 kg                 CurrentWeight: 81.2 kg   Referring physician: Dr Tasia Catchings   CHIEF COMPLAINT:      HISTORY OF PRESENT ILLNESS   Jessica Willis is a 55 y.o. female with medical history significant of stage IV metastasized breast cancer on chemotherapy, tachycardia, GERD, anemia, malignant pleural effusion, hemothorax, who presents with shortness of breath. In ER initally presentedwith cough and sob. She had massive rt pleural effusion with collapse of rt lung . She also had rt breast mass with changes in skin color for  6 months . She underwent thoracenteiss and it was metastatic adenocarcinoma. MRI of the brain showed scattered metastatic implants upper cervical spine.  She had a port placement and also underwent incisional biopsy of the rt breast mass on 06/28/20.  She got a dose of taxol while inpatient and was discharged on 07/01/20. She followed up with oncologist as OP, on palliative chemotherapy with weekly taxol.  Has been having tachycardia and SOB- saw Cardiology on 09/26/20 Prolonged QT and tachycardia. Pt was found to have WBC 22.3, negative Covid PCR, lactic acid 2.3, INR 1.3, PTT 36, electrolytes renal function okay, temperature 99.5, blood pressure 138/80, heart rate 134, RR 20, oxygen saturation 87% on room air, which improved her to 100% on 2 L oxygen. PCCM consultation placed due to complicated respiratory status with metastatic lung cancer and large malignant effusion. CT chest below shows pleural effusion bilaterally worse on right with metastatic lesions visible on bony spine from thoracic spine down and significant hilar and mediastinal lymphadneopathy suggestive of widespread mets.   10/13/20- patient reports improved breathing, weaned O2 to 4L [place].  She is diuresing well which ishelping her breathing.  She responded well to steroids,  inflammatory markers are elevated likely due to active malignancy and CAP(although most of this is likely fluid and unclear how much is actual infection).  She feels strong enough to get up and participate with PT.  She is using IS and able to take tidal volume 500cc today.  I have encouraged to use every hour. I met with son and reviewed care plan.   10/14/20-  Patient had onc tx today. She felt onset of SOB few hours after coming back however vitals stable.   10/15/20- patient had incresed O2 req this am to 5L/min.  I will repeat CXR today.  She was able to eat this am, states overnight she felt worsening SOB post therapy yesterday. Shes making adequate urine overnight, will keep lasix at 40.  Will dc albumin as this can sometimes cause pulm edema and will keep low dose bid solumedrol 20 for now.   10/16/20-  Patient weaned to 4L/min [place]. Reviewed CXR with improved left side still significant airspace and interstitial opacification on right. She had documented acute episode of resp distress with spO2 74%   10/17/20- patient resting in bed in no distress. She is on 4-5L/mn [place].  Lasix 40 BID with albumin infusing but patient remains with plateau in terms of improvement over past 24-48h.  Continue MEtaneb. PT/OT   10/18/20- despite >2L diuresis overnight patient actually seems to have increased O2 req hower spO2 100% she may be able towean more.  She reports improvement and has no pain or muscle cramping.  She tolerates  BID lasix well with normal renal function today. Plan to continue current scope of therapy from pulm perspective and will consider repeat CT chest for interval changes.    10/19/20- patient is down to 3L/min.  Oral mucosa is dry and there is oral thrush. Will start Diflucan today and magic mouth wash.   10/20/20- patient on 3L again today.  [name] is improving. Her urine is green with sedimentation. Will obtain UA and urine sediments.  10/21/20- optimized for dc home. Can follow up on outpatient if  patient wishes.   PAST MEDICAL HISTORY   Past Medical History:  Diagnosis Date  . Anemia   . Family history of breast cancer   . Patient denies medical problems      SURGICAL HISTORY   Past Surgical History:  Procedure Laterality Date  . BREAST BIOPSY  06/28/2020   Procedure: BREAST BIOPSY;  Surgeon: Benjamine Sprague, DO;  Location: [facility] ORS;  Service: General;;  . PORTACATH PLACEMENT N/A 06/28/2020   Procedure: INSERTION PORT-A-CATH;  Surgeon: Benjamine Sprague, DO;  Location: [facility] ORS;  Service: General;  Laterality: N/A;  . TUBAL LIGATION    . VIDEO ASSISTED THORACOSCOPY (VATS)/THOROCOTOMY Right 06/23/2020   Procedure: ATTEMPTED VIDEO ASSISTED THORACOSCOPY (VATS);  Surgeon: Nestor Lewandowsky, MD;  Location: [facility] ORS;  Service: General;  Laterality: Right;     FAMILY HISTORY   Family History  Problem Relation Age of Onset  . Diabetes Other   . Hypertension Other   . Diabetes Maternal Aunt   . Breast cancer Cousin        dx 71s  . Breast cancer Cousin        dx 42s     SOCIAL HISTORY   Social History   Tobacco Use  . Smoking status: Never Smoker  . Smokeless tobacco: Never Used  Vaping Use  . Vaping Use: Never used  Substance Use Topics  . Alcohol use: Not Currently    Alcohol/week: 1.0 standard drink    Types: 1 Cans of beer per week  . Drug use: No     MEDICATIONS    Home Medication:    Current Medication:  Current Facility-Administered Medications:  .  acetaminophen (TYLENOL) tablet 650 mg, 650 mg, Oral, Q6H PRN, Ivor Costa, MD, 650 mg at 10/09/20 2144 .  albumin human 25 % solution 12.5 g, 12.5 g, Intravenous, Daily, Channing Yeager, MD, Last Rate: 60 mL/hr at 10/21/20 1248, 12.5 g at 10/21/20 1248 .  albuterol (PROVENTIL) (2.5 MG/3ML) 0.083% nebulizer solution 2.5 mg, 2.5 mg, Nebulization, Q6H PRN, Samuella Cota, MD, 2.5 mg at 10/16/20 1612 .  albuterol (VENTOLIN HFA) 108 (90 Base) MCG/ACT inhaler 2 puff, 2 puff, Inhalation, Q4H PRN, Ivor Costa, MD .   bisacodyl (DULCOLAX) suppository 10 mg, 10 mg, Rectal, Daily PRN, Samuella Cota, MD .  calcium carbonate (OS-CAL - dosed in mg of elemental calcium) tablet 1,250 mg, 1,250 mg, Oral, Q breakfast, Ivor Costa, MD, 1,250 mg at 10/20/20 1015 .  Chlorhexidine Gluconate Cloth 2 % PADS 6 each, 6 each, Topical, Daily, Sharen Hones, MD, 6 each at 10/21/20 1043 .  dextromethorphan-guaiFENesin (MUCINEX DM) 30-600 MG per 12 hr tablet 1 tablet, 1 tablet, Oral, BID PRN, Ivor Costa, MD, 1 tablet at 10/19/20 0907 .  senna (SENOKOT) tablet 17.2 mg, 2 tablet, Oral, BID, 17.2 mg at 10/18/20 1026 **AND** docusate sodium (COLACE) capsule 100 mg, 100 mg, Oral, BID, Samuella Cota, MD, 100 mg at 10/18/20 1026 .  dronabinol (MARINOL) capsule  5 mg, 5 mg, Oral, BID Noralee Chars, MD, 5 mg at 10/21/20 1041 .  enoxaparin (LOVENOX) injection 40 mg, 40 mg, Subcutaneous, Q24H, Ivor Costa, MD, 40 mg at 10/20/20 2300 .  feeding supplement (ENSURE ENLIVE / ENSURE PLUS) liquid 237 mL, 237 mL, Oral, QID, Lorella Nimrod, MD, 237 mL at 10/21/20 1043 .  ferrous sulfate tablet 325 mg, 325 mg, Oral, BID WC, Ivor Costa, MD, 325 mg at 10/21/20 1042 .  [COMPLETED] fluconazole (DIFLUCAN) tablet 200 mg, 200 mg, Oral, Once, 200 mg at 10/20/20 1403 **FOLLOWED BY** fluconazole (DIFLUCAN) tablet 100 mg, 100 mg, Oral, Daily, Amin, Soundra Pilon, MD, 100 mg at 10/21/20 1044 .  furosemide (LASIX) injection 40 mg, 40 mg, Intravenous, Daily, Lanney Gins, Makenize Messman, MD, 40 mg at 10/21/20 1041 .  heparin lock flush 100 unit/mL, 500 Units, Intravenous, Once, Lorella Nimrod, MD .  lidocaine-prilocaine (EMLA) cream 1 application, 1 application, Topical, PRN, Ivor Costa, MD .  magic mouthwash w/lidocaine, 15 mL, Oral, TID, Lorella Nimrod, MD, 15 mL at 10/21/20 1249 .  magnesium chloride (SLOW-MAG) 64 MG SR tablet 64 mg, 1 tablet, Oral, Daily, Ivor Costa, MD, 64 mg at 10/21/20 1249 .  melatonin tablet 5 mg, 5 mg, Oral, QHS, Sharen Hones, MD, 5 mg at 10/20/20 2300 .   metoprolol tartrate (LOPRESSOR) tablet 50 mg, 50 mg, Oral, BID, Ivor Costa, MD, 50 mg at 10/21/20 1042 .  multivitamin with minerals tablet 1 tablet, 1 tablet, Oral, Daily, Samuella Cota, MD, 1 tablet at 10/21/20 1042 .  ondansetron (ZOFRAN) injection 4 mg, 4 mg, Intravenous, Q8H PRN, Ivor Costa, MD, 4 mg at 10/17/20 1309 .  polyethylene glycol (MIRALAX / GLYCOLAX) packet 17 g, 17 g, Oral, BID, Samuella Cota, MD, 17 g at 10/18/20 1024 .  potassium chloride SA (KLOR-CON) CR tablet 20 mEq, 20 mEq, Oral, Daily, Earlie Server, MD, 20 mEq at 10/21/20 1041 .  predniSONE (DELTASONE) tablet 50 mg, 50 mg, Oral, Q breakfast, Nakeitha Milligan, MD, 50 mg at 10/21/20 1042 .  scopolamine (TRANSDERM-SCOP) 1 MG/3DAYS 1.5 mg, 1 patch, Transdermal, Q72H, Earlie Server, MD, 1.5 mg at 10/20/20 1404 .  simethicone (MYLICON) chewable tablet 80 mg, 80 mg, Oral, Daily PRN, Earlie Server, MD, 80 mg at 10/15/20 1002 .  sodium chloride (OCEAN) 0.65 % nasal spray 1 spray, 1 spray, Each Nare, PRN, Samuella Cota, MD .  sodium chloride flush (NS) 0.9 % injection 10-40 mL, 10-40 mL, Intracatheter, Q12H, Sharen Hones, MD, 10 mL at 10/21/20 1044 .  sodium chloride flush (NS) 0.9 % injection 10-40 mL, 10-40 mL, Intracatheter, PRN, Sharen Hones, MD, 10 mL at 10/10/20 1447    ALLERGIES   Patient has no known allergies.     REVIEW OF SYSTEMS    Review of Systems:  Gen:  Denies  fever, sweats, chills weigh loss  HEENT: Denies blurred vision, double vision, ear pain, eye pain, hearing loss, nose bleeds, sore throat Cardiac:  No dizziness, chest pain or heaviness, chest tightness,edema Resp:   Denies cough or sputum porduction, shortness of breath,wheezing, hemoptysis,  Gi: Denies swallowing difficulty, stomach pain, nausea or vomiting, diarrhea, constipation, bowel incontinence Gu:  Denies bladder incontinence, burning urine Ext:   Denies Joint pain, stiffness or swelling Skin: Denies  skin rash, easy bruising or bleeding  or hives Endoc:  Denies polyuria, polydipsia , polyphagia or weight change Psych:   Denies depression, insomnia or hallucinations   Other:  All other systems negative   VS: BP 109/64 (BP  Location: Left Arm)   Pulse (!) 110   Temp 98.6 F (37 C) (Oral)   Resp 16   Ht $R'5\' 10"'IE$  (1.778 m)   Wt 81.2 kg   LMP 06/16/2015 Comment: Tubal Ligation   SpO2 99%   BMI 25.68 kg/m      PHYSICAL EXAM    GENERAL:NAD, no fevers, chills, no weakness no fatigue HEAD: Normocephalic, atraumatic.  EYES: Pupils equal, round, reactive to light. Extraocular muscles intact. No scleral icterus.  MOUTH: dry oral mucosa. Dentition intact. No abscess noted.  EAR, NOSE, THROAT: Clear without exudates. No external lesions.  NECK: Supple. No thyromegaly. No nodules. No JVD.  PULMONARY: mild rhonchi bilatarally CARDIOVASCULAR: S1 and S2. Regular rate and rhythm. No murmurs, rubs, or gallops. No edema. Pedal pulses 2+ bilaterally.  GASTROINTESTINAL: Soft, nontender, nondistended. No masses. Positive bowel sounds. No hepatosplenomegaly.  MUSCULOSKELETAL: No swelling, clubbing, or edema. Range of motion full in all extremities.  NEUROLOGIC: Cranial nerves II through XII are intact. No gross focal neurological deficits. Sensation intact. Reflexes intact.  SKIN: No ulceration, lesions, rashes, or cyanosis. Skin warm and dry. Turgor intact.  PSYCHIATRIC: Mood, affect within normal limits. The patient is awake, alert and oriented x 3. Insight, judgment intact.       IMAGING    DG Chest 2 View  Result Date: 10/07/2020 CLINICAL DATA:  Hypoxia, shortness of breath, metastatic breast cancer EXAM: CHEST - 2 VIEW COMPARISON:  09/13/2020 FINDINGS: LEFT subclavian Port-A-Cath with tip projecting over RIGHT atrium. Enlargement of cardiac silhouette. Pulmonary vascular congestion. Persistent enlargement of RIGHT hilum question mass or adenopathy. Increased infiltrate of the mid to lower RIGHT lung. Small RIGHT pleural  effusion again seen. Accentuation of markings in LEFT perihilar region little changed. No pneumothorax or acute osseous findings. IMPRESSION: Increased RIGHT perihilar infiltrate with persistent small RIGHT pleural effusion. Enlargement of RIGHT pulmonary hilum by adenopathy versus perihilar mass. Electronically Signed   By: Lavonia Dana M.D.   On: 10/07/2020 11:32   CT Angio Chest PE W and/or Wo Contrast  Result Date: 10/07/2020 CLINICAL DATA:  Breast carcinoma. Tachycardia and shortness of breath EXAM: CT ANGIOGRAPHY CHEST WITH CONTRAST TECHNIQUE: Multidetector CT imaging of the chest was performed using the standard protocol during bolus administration of intravenous contrast. Multiplanar CT image reconstructions and MIPs were obtained to evaluate the vascular anatomy. CONTRAST:  23mL OMNIPAQUE IOHEXOL 350 MG/ML SOLN COMPARISON:  CT angiogram chest June 15, 2020; chest radiograph October 07, 2020 FINDINGS: Cardiovascular: There is no evident pulmonary embolus. No appreciable thoracic aortic aneurysm or dissection. Visualized great vessels appear unremarkable. There is no appreciable pericardial effusion or pericardial thickening. Port-A-Cath tip is in the superior vena cava near the cavoatrial junction. Mediastinum/Nodes: Thyroid appears normal. There are multiple axillary lymph nodes bilaterally, more severe on the right than on the left. The largest lymph node is seen in the right axillary region measuring 2.7 x 2.1 cm. There is adenopathy in the right supraclavicular and infraclavicular regions with extension of adenopathy into the right Peri carinal region. Largest individual lymph node in these areas measures 2.6 x 1.8 cm. Several left supraclavicular lymph nodes are also noted, largest measuring 1.7 x 1.3 cm. There are several subcentimeter mediastinal lymph nodes. There is extensive retrocrural adenopathy. The largest lymph node or collection of matted lymph nodes is seen inferior to the aorta on  the left posteriorly measuring 3.4 x 2.1 cm. No esophageal lesions are evident. Lungs/Pleura: There is extensive airspace consolidation in portions of the  right middle and lower lobes with interspersed areas of loculated pleural effusion. There is ill-defined airspace opacity consistent with pneumonia in the left upper lobe. Loculated fluid tracks along the left major fissure. There may well be associated metastasis within the fissure given the extensive nodularity in this area. There is a small left pleural effusion with left base atelectasis. Pleural metastases noted along each upper hemithorax, primarily posteriorly as well as in the right apex. Upper Abdomen: Retrocrural adenopathy extends into the upper abdominal region. Visualized upper abdominal structures otherwise appear unremarkable. Musculoskeletal: There is a large mass occupying the right breast and chest wall region with invasion of the deep muscles of the chest wall on the right. There is also invasion of the right axilla. There is extensive subcutaneous thickening in both breast regions. There is a mass in the lateral left breast with extension of soft tissue prominence along the lateral left hemithorax, likely due to extension of neoplasm into the adjacent soft tissues. There is widespread sclerotic bony metastatic disease throughout the thoracic region. Review of the MIP images confirms the above findings. IMPRESSION: 1. No demonstrable pulmonary embolus. No thoracic aortic aneurysm or dissection. 2. Areas of airspace consolidation noted in the right middle and lower lobes. More ill-defined infiltrate is noted in the left upper lobe anteriorly. Areas of loculated fluid noted in the left major fissure. Nodularity in this area could indicate superimposed masses in left major fissure. There is pleural thickening along both posterior upper hemithorax regions, likely pleural metastases. Loculated fluid noted in areas of infiltrate on the right. Small  left pleural effusion noted. 3. Adenopathy at multiple sites, most severe in the retrocrural regions bilaterally, right supraclavicular region, and right axillary region, although there is left axillary and left supraclavicular adenopathy. 4. Large mass lesions occupying the right breast with extension and invasion of the chest wall and right axillary regions. A lesser degree of metastatic disease to the left of midline in the subcutaneous tissues and lateral left breast/lateral left chest wall soft tissues noted. 5. Multifocal sclerotic bony metastatic disease throughout the thoracic region noted. Electronically Signed   By: Lowella Grip III M.D.   On: 10/07/2020 13:23   US Venous Img Upper Uni Right(DVT)  Result Date: 10/08/2020 CLINICAL DATA:  History of stage IV breast cancer now with right upper extremity pain and edema. Evaluate for DVT. EXAM: RIGHT UPPER EXTREMITY VENOUS DOPPLER ULTRASOUND TECHNIQUE: Gray-scale sonography with graded compression, as well as color Doppler and duplex ultrasound were performed to evaluate the upper extremity deep venous system from the level of the subclavian vein and including the jugular, axillary, basilic, radial, ulnar and upper cephalic vein. Spectral Doppler was utilized to evaluate flow at rest and with distal augmentation maneuvers. COMPARISON:  None. FINDINGS: Contralateral Subclavian Vein: Respiratory phasicity is normal and symmetric with the symptomatic side. No evidence of thrombus. Normal compressibility. Internal Jugular Vein: No evidence of thrombus. Normal compressibility, respiratory phasicity and response to augmentation. Subclavian Vein: No evidence of thrombus. Normal compressibility, respiratory phasicity and response to augmentation. Axillary Vein: Appears patent where imaged though evaluation degraded secondary to patient's discomfort with sonographic evaluation of the axilla. Cephalic Vein: No evidence of thrombus. Normal compressibility,  respiratory phasicity and response to augmentation. Basilic Vein: No evidence of thrombus. Normal compressibility, respiratory phasicity and response to augmentation. Brachial Veins: No evidence of thrombus within either of the paired brachial veins. Normal compressibility, respiratory phasicity and response to augmentation. Radial Veins: No evidence of thrombus. Normal compressibility, respiratory  phasicity and response to augmentation. Ulnar Veins: No evidence of thrombus. Normal compressibility, respiratory phasicity and response to augmentation. Venous Reflux:  None visualized. Other Findings: There is a moderate amount of subcutaneous edema at the level of the right upper arm. IMPRESSION: No evidence of DVT within the right upper extremity. Electronically Signed   By: Sandi Mariscal M.D.   On: 10/08/2020 14:29   DG Chest Port 1 View  Result Date: 10/15/2020 CLINICAL DATA:  Pneumonia and pleural effusion. Metastatic breast cancer. EXAM: PORTABLE CHEST 1 VIEW COMPARISON:  10/13/2020 chest radiograph and prior studies FINDINGS: Cardiomediastinal silhouette is unchanged with RIGHT mediastinal fullness. A LEFT subclavian Port-A-Cath with tip overlying the UPPER RIGHT atrium again noted. Scattered opacities within both lungs are again noted with a RIGHT pleural effusion. There is no evidence of pneumothorax. No significant changes are identified. IMPRESSION: Unchanged appearance of the chest with scattered opacities within both lungs and RIGHT pleural effusion. Electronically Signed   By: Margarette Canada M.D.   On: 10/15/2020 11:06   DG Chest Port 1 View  Result Date: 10/13/2020 CLINICAL DATA:  Pleural effusion EXAM: PORTABLE CHEST 1 VIEW COMPARISON:  10/07/2020 FINDINGS: Unchanged positioning of left-sided Port-A-Cath. Stable cardiomediastinal contours. Persistent small right pleural effusion. Slight progression of airspace opacity throughout the right lung most pronounced within the right lung base. Similar  interstitial opacities throughout the left lung. No pneumothorax. IMPRESSION: 1. Slight progression of airspace opacity throughout the right lung, most pronounced within the right lung base. 2. Persistent small right pleural effusion. Electronically Signed   By: Davina Poke D.O.   On: 10/13/2020 14:10   ECHOCARDIOGRAM COMPLETE  Result Date: 10/10/2020    ECHOCARDIOGRAM REPORT   Patient Name:   Jessica Willis Date of Exam: 10/10/2020 Medical Rec #:  [id number]           Height:       70.0 in Accession #:    [id number]          Weight:       178.7 lb Date of Birth:  [birth year]           BSA:          1.990 m Patient Age:    21 years            BP:           119/79 mmHg Patient Gender: F                   HR:           105 bpm. Exam Location:  [facility] Procedure: 2D Echo, Limited Color Doppler and Cardiac Doppler Indications:     R06.03 Acute Respiratory Distress  History:         Patient has no prior history of Echocardiogram examinations.                  Signs/Symptoms:Shortness of Breath. Breast cancer.  Sonographer:     Charmayne Sheer RDCS (AE) Referring Phys:  [id number] Sharen Hones Diagnosing Phys: Kathlyn Sacramento MD  Sonographer Comments: Technically challenging study due to limited acoustic windows. IMPRESSIONS  1. Left ventricular ejection fraction, by estimation, is 50 to 55%. The left ventricle has low normal function. Left ventricular endocardial border not optimally defined to evaluate regional wall motion. Left ventricular diastolic parameters are indeterminate.  2. Right ventricular systolic function is normal. The right ventricular size is normal. Tricuspid regurgitation signal is inadequate for assessing PA pressure.  3.  The mitral valve is normal in structure. No evidence of mitral valve regurgitation. No evidence of mitral stenosis.  4. The aortic valve is normal in structure. Aortic valve regurgitation is not visualized. No aortic stenosis is present.  5. The inferior vena cava is normal in size  with greater than 50% respiratory variability, suggesting right atrial pressure of 3 mmHg. FINDINGS  Left Ventricle: Left ventricular ejection fraction, by estimation, is 50 to 55%. The left ventricle has low normal function. Left ventricular endocardial border not optimally defined to evaluate regional wall motion. The left ventricular internal cavity  size was normal in size. There is no left ventricular hypertrophy. Left ventricular diastolic parameters are indeterminate. Right Ventricle: The right ventricular size is normal. No increase in right ventricular wall thickness. Right ventricular systolic function is normal. Tricuspid regurgitation signal is inadequate for assessing PA pressure. Left Atrium: Left atrial size was normal in size. Right Atrium: Right atrial size was normal in size. Pericardium: There is no evidence of pericardial effusion. Mitral Valve: The mitral valve is normal in structure. No evidence of mitral valve regurgitation. No evidence of mitral valve stenosis. MV peak gradient, 4.5 mmHg. The mean mitral valve gradient is 2.0 mmHg. Tricuspid Valve: The tricuspid valve is normal in structure. Tricuspid valve regurgitation is not demonstrated. No evidence of tricuspid stenosis. Aortic Valve: The aortic valve is normal in structure. Aortic valve regurgitation is not visualized. No aortic stenosis is present. Aortic valve mean gradient measures 4.0 mmHg. Aortic valve peak gradient measures 6.8 mmHg. Pulmonic Valve: The pulmonic valve was not well visualized. Pulmonic valve regurgitation is not visualized. No evidence of pulmonic stenosis. Aorta: The aortic root is normal in size and structure. Venous: The inferior vena cava is normal in size with greater than 50% respiratory variability, suggesting right atrial pressure of 3 mmHg. IAS/Shunts: No atrial level shunt detected by color flow Doppler.   LV Volumes (MOD) LV vol d, MOD A4C: 99.4 ml Diastology LV vol s, MOD A4C: 58.3 ml LV e' lateral:    10.30 cm/s LV SV MOD A4C:     99.4 ml LV E/e' lateral: 5.8  LEFT ATRIUM           Index LA Vol (A4C): 41.5 ml 20.86 ml/m  AORTIC VALVE AV Vmax:           130.00 cm/s AV Vmean:          93.400 cm/s AV VTI:            0.243 m AV Peak Grad:      6.8 mmHg AV Mean Grad:      4.0 mmHg LVOT Vmax:         92.70 cm/s LVOT Vmean:        60.500 cm/s LVOT VTI:          0.151 m LVOT/AV VTI ratio: 0.62 MITRAL VALVE MV Area (PHT): 6.96 cm    SHUNTS MV Peak grad:  4.5 mmHg    Systemic VTI: 0.15 m MV Mean grad:  2.0 mmHg MV Vmax:       1.06 m/s MV Vmean:      67.9 cm/s MV Decel Time: 109 msec MV E velocity: 59.60 cm/s MV A velocity: 68.60 cm/s MV E/A ratio:  0.87 Kathlyn Sacramento MD Electronically signed by Kathlyn Sacramento MD Signature Date/Time: 10/10/2020/2:00:06 PM    Final          ASSESSMENT/PLAN   Malignant pleural effusion    - since patient had  initial therapeutic response to thoracentesis she will likely benefit from permament tunneled pleural catheter via IR service as palliative option for malignant effusion due to high probability of rapid re-accumulation    -there is no renal failure, hepatic synthetic function within reference range without HASH/cirhosis, no CHF per most recent TTE   - no lymphadema   Metastatic lung cancer   -Currently being followed by oncology Dr Tasia Catchings - appreciate input     Compressive atelectasis   Worse at Right lowe lobe - patient is weak and unlikely to be able to perform well with incentive spirometry.  She is a good candidate for albuterol infused Metaneb therapy can do BID to help with recruitment.     Chronic deconditioning and severe protein calorie malnutrition     -nutritional and PT evaluation     -palliative care on case - appreciate input     -bitemporal wasting noted on examination     - high risk refeeding syndrome - monitor electorlytes - will place consult for pharmD and RD     Acute hypoxemic respiratory failure    - patient generally not on  supplemental O2 currently on 7L/min supplemental O2>>4>>5- 10/15/20>>3L/min     - she has leukocytosis but gram stain is negative on pleural fluid and there is no obvious infiltrate although basis bilaterally may have pneumonia, this is obscured by effusion and atelectatic sement.  Legionella and strep pneumo ag is negative. I feel that CAP is less likely.  Have ordered iflammatory markers and PCT.      - lasix to help with residual effusion     -Soluedrol 20 bid IV>>prednisone taper 10/18/20     - lasix 40 iv daily >>bid 10/16/20     - albumin 43m 25% 1 amd daily with diuresis     - chest physitherapy to recruit atelectatic lungs     -etiology is all of the above conditions     - TTE with no findings to suggest CHF realted hypoxemia  -repeat cxr 10/15/20- reviewed     Thank you for allowing me to participate in the care of this patient.   Patient/Family are satisfied with care plan and all questions have been answered.  This document was prepared using Dragon voice recognition software and may include unintentional dictation errors.     FOttie Glazier M.D.  Division of [facility]

## 2020-10-21 NOTE — Telephone Encounter (Signed)
Pt will be dc from hospital today. Please schedule patient for Lab/MD/ [facility] (cytoxan*new*) on Friday or the following Monday. Please notify pt of appt. Thanks

## 2020-10-21 NOTE — Progress Notes (Signed)
SATURATION QUALIFICATIONS:   Patient Saturations on Room Air at Rest = 94%  Patient Saturations on Room Air while Ambulating = 84%  Patient Saturations on 2 Liters of oxygen while Ambulating = 96%

## 2020-10-21 NOTE — Progress Notes (Signed)
Occupational Therapy Treatment Patient Details Name: Caera Enwright MRN: [id number] DOB: 1966/06/16 Today's Date: 10/21/2020    History of present illness Jessica Willis is a 55 y.o. female with medical history significant of stage IV metastasized breast cancer on chemotherapy, tachycardia, GERD, anemia, malignant pleural effusion, hemothorax, who presented to hosptial ED on 10/07/2020 with shortness of breath. PE ruled out.  Chest CT scan showed pneumonia of the right middle lobe and lower lobe. Admitted for acute hypoxic respiratory failure, sepsis thought secondary to pneumonia complicated by metastatic breast cancer. Seen by pulmonology, ID, oncology and PMT. Plan is to continue aggressive treatment, started chemotherapy 3/4. Has fungating right breast mass with drainage. Disease not curable, refractory to first-line chemotherapy, aggressive in nature.  Palliative care following.  Guarded prognosis.   OT comments  Ms. Funes presents today with generalized weakness and reduced endurance, limiting her ability to engage in functional mobility tasks. She denies pain today, endorses severe fatigue. She made a good effort today, and her ability to engage in functional mobility tasks was much improved from her session with this therapist two days earlier. In preparation for DC, provided education re: energy conservation, home/routine modifications, falls prevention, DME. Ms. Havener performed bed mobility, transfers, grooming, UB and LB dressing, all IND-CGA. She remains unsteady in standing. Recommend HHOT post D/C to enhance pt's safety, mobility, and quality of life.    Follow Up Recommendations  Home health OT;Supervision/Assistance - 24 hour    Equipment Recommendations  None recommended by OT    Recommendations for Other Services      Precautions / Restrictions Precautions Precautions: Fall Restrictions Weight Bearing Restrictions: No       Mobility Bed Mobility Overal  bed mobility: Needs Assistance Bed Mobility: Supine to Sit;Rolling Rolling: Supervision   Supine to sit: Supervision          Transfers Overall transfer level: Needs assistance Equipment used: Rolling walker (2 wheeled) Transfers: Sit to/from [facility] Sit to Stand: Min guard Stand pivot transfers: Min guard       General transfer comment: Much improved bed mobility and t/fs today    Balance Overall balance assessment: Needs assistance Sitting-balance support: No upper extremity supported;Feet supported Sitting balance-Leahy Scale: Good     Standing balance support: Single extremity supported Standing balance-Leahy Scale: Fair                             ADL either performed or assessed with clinical judgement   ADL Overall ADL's : Needs assistance/impaired Eating/Feeding: Independent Eating/Feeding Details (indicate cue type and reason): MD changed her meds yesterday, and pt now has her sense of taste back Grooming: Wash/dry face;Wash/dry hands;Set up;Modified independent           Upper Body Dressing : Min guard   Lower Body Dressing: Independent Lower Body Dressing Details (indicate cue type and reason): donning/doffing socks               General ADL Comments: IND LB dressing, grooming from seated position; CGA UB dressing.     Vision Patient Visual Report: No change from baseline     Perception     Praxis      Cognition Arousal/Alertness: Awake/alert Behavior During Therapy: WFL for tasks assessed/performed;Flat affect Overall Cognitive Status: Within Functional Limits for tasks assessed  Exercises Total Joint Exercises Ankle Circles/Pumps: AROM;10 reps;Both;Seated Hip ABduction/ADduction: AROM;Both;5 reps;Seated Straight Leg Raises: AROM;Both;5 reps;Seated Marching in Standing: AROM;Both;5 reps;Seated Other Exercises Other Exercises: Pt educ re:  falls prevention, home/routine modifications, HEP, functional mobility   Shoulder Instructions       General Comments SpO2 95% on 3L [place]    Pertinent Vitals/ Pain       Pain Assessment: No/denies pain  Home Living                                          Prior Functioning/Environment              Frequency  Min 1X/week        Progress Toward Goals  OT Goals(current goals can now be found in the care plan section)  Progress towards OT goals: Progressing toward goals  Acute Rehab OT Goals Patient Stated Goal: To return home OT Goal Formulation: With patient Time For Goal Achievement: 10/31/20 Potential to Achieve Goals: Good  Plan Discharge plan remains appropriate;Frequency remains appropriate    Co-evaluation                 AM-PAC OT "6 Clicks" Daily Activity     Outcome Measure   Help from another person eating meals?: None Help from another person taking care of personal grooming?: A Little Help from another person toileting, which includes using toliet, bedpan, or urinal?: A Lot Help from another person bathing (including washing, rinsing, drying)?: A Lot Help from another person to put on and taking off regular upper body clothing?: A Little Help from another person to put on and taking off regular lower body clothing?: A Little 6 Click Score: 17    End of Session Equipment Utilized During Treatment: Oxygen;Rolling walker  OT Visit Diagnosis: Unsteadiness on feet (R26.81);Repeated falls (R29.6);Muscle weakness (generalized) (M62.81)   Activity Tolerance Patient tolerated treatment well   Patient Left in chair;with call bell/phone within reach;with nursing/sitter in room   Nurse Communication Mobility status        Time: 1121-6244 OT Time Calculation (min): 33 min  Charges: OT General Charges $OT Visit: 1 Visit OT Treatments $Self Care/Home Management : 23-37 mins  Josiah Lobo, PhD, MS, OTR/L 10/21/20, 2:36  PM

## 2020-10-21 NOTE — Progress Notes (Signed)
Discharge instructions given. Patient verbalized understanding and all questions were answered. Patient family member will be here shortly to take her home per patient.

## 2020-10-24 ENCOUNTER — Other Ambulatory Visit: Payer: Self-pay | Admitting: Oncology

## 2020-10-25 ENCOUNTER — Observation Stay: Payer: Medicaid Other

## 2020-10-25 ENCOUNTER — Encounter: Payer: Self-pay | Admitting: Emergency Medicine

## 2020-10-25 ENCOUNTER — Emergency Department: Payer: Medicaid Other

## 2020-10-25 ENCOUNTER — Other Ambulatory Visit: Payer: Self-pay

## 2020-10-25 ENCOUNTER — Inpatient Hospital Stay
Admission: EM | Admit: 2020-10-25 | Discharge: 2020-11-12 | DRG: 640 | Disposition: A | Discharge status: Skilled Nursing Facility | Payer: Medicaid Other | Attending: Internal Medicine | Admitting: Internal Medicine

## 2020-10-25 ENCOUNTER — Other Ambulatory Visit: Payer: Medicaid Other

## 2020-10-25 DIAGNOSIS — T451X5A Adverse effect of antineoplastic and immunosuppressive drugs, initial encounter: Secondary | ICD-10-CM | POA: Diagnosis present

## 2020-10-25 DIAGNOSIS — E876 Hypokalemia: Secondary | ICD-10-CM

## 2020-10-25 DIAGNOSIS — I82411 Acute embolism and thrombosis of right femoral vein: Secondary | ICD-10-CM

## 2020-10-25 DIAGNOSIS — Z79899 Other long term (current) drug therapy: Secondary | ICD-10-CM

## 2020-10-25 DIAGNOSIS — E875 Hyperkalemia: Secondary | ICD-10-CM | POA: Diagnosis not present

## 2020-10-25 DIAGNOSIS — Z7901 Long term (current) use of anticoagulants: Secondary | ICD-10-CM

## 2020-10-25 DIAGNOSIS — Z6821 Body mass index (BMI) 21.0-21.9, adult: Secondary | ICD-10-CM

## 2020-10-25 DIAGNOSIS — J9611 Chronic respiratory failure with hypoxia: Secondary | ICD-10-CM

## 2020-10-25 DIAGNOSIS — R531 Weakness: Secondary | ICD-10-CM

## 2020-10-25 DIAGNOSIS — L8990 Pressure ulcer of unspecified site, unspecified stage: Secondary | ICD-10-CM | POA: Insufficient documentation

## 2020-10-25 DIAGNOSIS — C761 Malignant neoplasm of thorax: Secondary | ICD-10-CM | POA: Diagnosis present

## 2020-10-25 DIAGNOSIS — J942 Hemothorax: Secondary | ICD-10-CM | POA: Diagnosis present

## 2020-10-25 DIAGNOSIS — E870 Hyperosmolality and hypernatremia: Principal | ICD-10-CM

## 2020-10-25 DIAGNOSIS — C7800 Secondary malignant neoplasm of unspecified lung: Secondary | ICD-10-CM | POA: Diagnosis present

## 2020-10-25 DIAGNOSIS — J1282 Pneumonia due to coronavirus disease 2019: Secondary | ICD-10-CM | POA: Diagnosis present

## 2020-10-25 DIAGNOSIS — R627 Adult failure to thrive: Principal | ICD-10-CM

## 2020-10-25 DIAGNOSIS — R630 Anorexia: Secondary | ICD-10-CM

## 2020-10-25 DIAGNOSIS — L89312 Pressure ulcer of right buttock, stage 2: Secondary | ICD-10-CM | POA: Diagnosis present

## 2020-10-25 DIAGNOSIS — D509 Iron deficiency anemia, unspecified: Secondary | ICD-10-CM | POA: Diagnosis present

## 2020-10-25 DIAGNOSIS — Z833 Family history of diabetes mellitus: Secondary | ICD-10-CM

## 2020-10-25 DIAGNOSIS — R110 Nausea: Secondary | ICD-10-CM | POA: Diagnosis present

## 2020-10-25 DIAGNOSIS — Z803 Family history of malignant neoplasm of breast: Secondary | ICD-10-CM

## 2020-10-25 DIAGNOSIS — L89322 Pressure ulcer of left buttock, stage 2: Secondary | ICD-10-CM | POA: Diagnosis present

## 2020-10-25 DIAGNOSIS — M79604 Pain in right leg: Secondary | ICD-10-CM

## 2020-10-25 DIAGNOSIS — R202 Paresthesia of skin: Secondary | ICD-10-CM

## 2020-10-25 DIAGNOSIS — N6310 Unspecified lump in the right breast, unspecified quadrant: Secondary | ICD-10-CM | POA: Diagnosis present

## 2020-10-25 DIAGNOSIS — C50919 Malignant neoplasm of unspecified site of unspecified female breast: Secondary | ICD-10-CM

## 2020-10-25 DIAGNOSIS — Y92009 Unspecified place in unspecified non-institutional (private) residence as the place of occurrence of the external cause: Secondary | ICD-10-CM

## 2020-10-25 DIAGNOSIS — C7951 Secondary malignant neoplasm of bone: Secondary | ICD-10-CM | POA: Diagnosis present

## 2020-10-25 DIAGNOSIS — Z95828 Presence of other vascular implants and grafts: Secondary | ICD-10-CM

## 2020-10-25 DIAGNOSIS — J910 Malignant pleural effusion: Secondary | ICD-10-CM | POA: Diagnosis present

## 2020-10-25 DIAGNOSIS — W19XXXA Unspecified fall, initial encounter: Secondary | ICD-10-CM

## 2020-10-25 DIAGNOSIS — U071 COVID-19: Secondary | ICD-10-CM | POA: Diagnosis present

## 2020-10-25 DIAGNOSIS — J9601 Acute respiratory failure with hypoxia: Secondary | ICD-10-CM

## 2020-10-25 DIAGNOSIS — Z8249 Family history of ischemic heart disease and other diseases of the circulatory system: Secondary | ICD-10-CM

## 2020-10-25 DIAGNOSIS — Z171 Estrogen receptor negative status [ER-]: Secondary | ICD-10-CM

## 2020-10-25 DIAGNOSIS — E43 Unspecified severe protein-calorie malnutrition: Secondary | ICD-10-CM

## 2020-10-25 DIAGNOSIS — R52 Pain, unspecified: Secondary | ICD-10-CM

## 2020-10-25 DIAGNOSIS — R112 Nausea with vomiting, unspecified: Secondary | ICD-10-CM | POA: Diagnosis present

## 2020-10-25 DIAGNOSIS — T380X5A Adverse effect of glucocorticoids and synthetic analogues, initial encounter: Secondary | ICD-10-CM | POA: Diagnosis not present

## 2020-10-25 DIAGNOSIS — E860 Dehydration: Secondary | ICD-10-CM

## 2020-10-25 DIAGNOSIS — C50911 Malignant neoplasm of unspecified site of right female breast: Secondary | ICD-10-CM | POA: Diagnosis present

## 2020-10-25 DIAGNOSIS — D638 Anemia in other chronic diseases classified elsewhere: Secondary | ICD-10-CM

## 2020-10-25 DIAGNOSIS — Z66 Do not resuscitate: Secondary | ICD-10-CM | POA: Diagnosis not present

## 2020-10-25 LAB — COMPREHENSIVE METABOLIC PANEL
ALT: 24 U/L (ref 0–44)
AST: 86 U/L — ABNORMAL HIGH (ref 15–41)
Albumin: 2.9 g/dL — ABNORMAL LOW (ref 3.5–5.0)
Alkaline Phosphatase: 300 U/L — ABNORMAL HIGH (ref 38–126)
Anion gap: 13 (ref 5–15)
BUN: 44 mg/dL — ABNORMAL HIGH (ref 6–20)
CO2: 33 mmol/L — ABNORMAL HIGH (ref 22–32)
Calcium: 8.7 mg/dL — ABNORMAL LOW (ref 8.9–10.3)
Chloride: 105 mmol/L (ref 98–111)
Creatinine, Ser: 0.62 mg/dL (ref 0.44–1.00)
GFR, Estimated: >60 mL/min (ref >60)
Glucose, Bld: 193 mg/dL — ABNORMAL HIGH (ref 70–99)
Potassium: 3.2 mmol/L — ABNORMAL LOW (ref 3.5–5.1)
Sodium: 151 mmol/L — ABNORMAL HIGH (ref 135–145)
Total Bilirubin: 0.8 mg/dL (ref 0.3–1.2)
Total Protein: 6.8 g/dL (ref 6.5–8.1)

## 2020-10-25 LAB — CBC WITH DIFFERENTIAL/PLATELET
Abs Immature Granulocytes: 0.09 10*3/uL — ABNORMAL HIGH (ref 0.00–0.07)
Basophils Absolute: 0 10*3/uL (ref 0.0–0.1)
Basophils Relative: 0 %
Eosinophils Absolute: 0 10*3/uL (ref 0.0–0.5)
Eosinophils Relative: 0 %
HCT: 33.2 % — ABNORMAL LOW (ref 36.0–46.0)
Hemoglobin: 9.3 g/dL — ABNORMAL LOW (ref 12.0–15.0)
Immature Granulocytes: 2 %
Lymphocytes Relative: 12 %
Lymphs Abs: 0.6 10*3/uL — ABNORMAL LOW (ref 0.7–4.0)
MCH: 24.6 pg — ABNORMAL LOW (ref 26.0–34.0)
MCHC: 28 g/dL — ABNORMAL LOW (ref 30.0–36.0)
MCV: 87.8 fL (ref 80.0–100.0)
Monocytes Absolute: 0.6 10*3/uL (ref 0.1–1.0)
Monocytes Relative: 12 %
Neutro Abs: 3.8 10*3/uL (ref 1.7–7.7)
Neutrophils Relative %: 74 %
Platelets: 195 10*3/uL (ref 150–400)
RBC: 3.78 MIL/uL — ABNORMAL LOW (ref 3.87–5.11)
RDW: 16.7 % — ABNORMAL HIGH (ref 11.5–15.5)
WBC: 5 10*3/uL (ref 4.0–10.5)
nRBC: 1.4 % — ABNORMAL HIGH (ref 0.0–0.2)

## 2020-10-25 LAB — D-DIMER, QUANTITATIVE: D-Dimer, Quant: >20 ug/mL-FEU — ABNORMAL HIGH (ref 0.00–0.50)

## 2020-10-25 LAB — CBG MONITORING, ED: Glucose-Capillary: 118 mg/dL — ABNORMAL HIGH (ref 70–99)

## 2020-10-25 LAB — FIBRINOGEN: Fibrinogen: 59 mg/dL — CL (ref 210–475)

## 2020-10-25 LAB — PROCALCITONIN: Procalcitonin: 73.8 ng/mL

## 2020-10-25 LAB — PROTIME-INR
INR: 1.8 — ABNORMAL HIGH (ref 0.8–1.2)
Prothrombin Time: 19.9 seconds — ABNORMAL HIGH (ref 11.4–15.2)

## 2020-10-25 LAB — RESP PANEL BY RT-PCR (FLU A&B, COVID) ARPGX2
Influenza A by PCR: NEGATIVE
Influenza B by PCR: NEGATIVE
SARS Coronavirus 2 by RT PCR: POSITIVE — AB

## 2020-10-25 LAB — MAGNESIUM: Magnesium: 2.9 mg/dL — ABNORMAL HIGH (ref 1.7–2.4)

## 2020-10-25 LAB — LACTIC ACID, PLASMA
Lactic Acid, Venous: 4.9 mmol/L (ref 0.5–1.9)
Lactic Acid, Venous: 4.2 mmol/L (ref 0.5–1.9)

## 2020-10-25 LAB — GLUCOSE, CAPILLARY: Glucose-Capillary: 165 mg/dL — ABNORMAL HIGH (ref 70–99)

## 2020-10-25 LAB — CK: Total CK: 44 U/L (ref 38–234)

## 2020-10-25 LAB — PHOSPHORUS: Phosphorus: 2.3 mg/dL — ABNORMAL LOW (ref 2.5–4.6)

## 2020-10-25 IMAGING — US US EXTREM LOW VENOUS*R*
1 series · 13 of 24 positions shown · non-contrast
Comparison: None.

CLINICAL DATA: Initial evaluation for acute right lower extremity
pain and swelling.



[Series 1: us venous img lower uni right (dvt) · portal-venous · 13 of 44 slices shown]
[im 1/44]
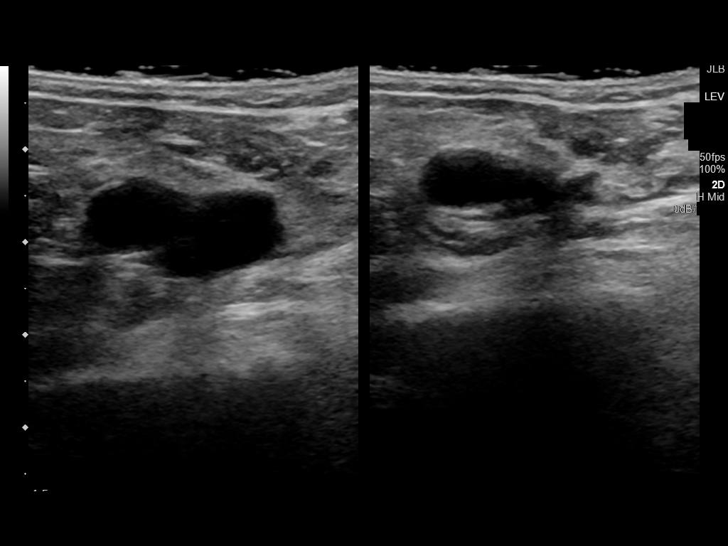
[im 4/44]
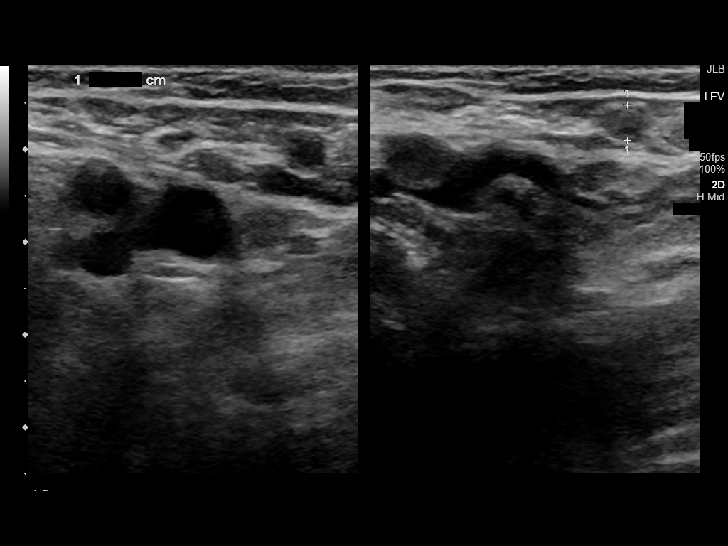
[im 8/44]
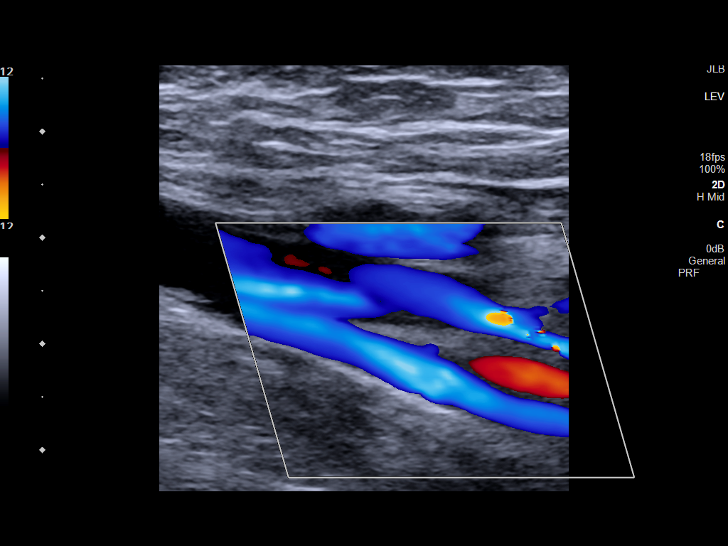
[im 12/44]
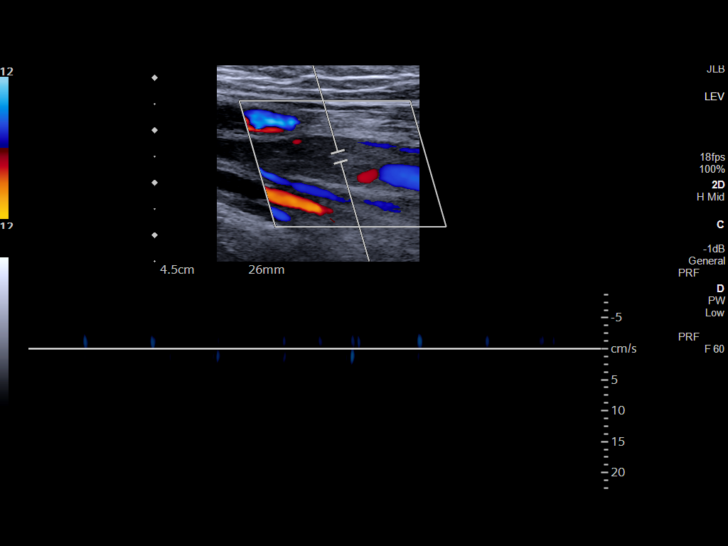
[im 15/44]
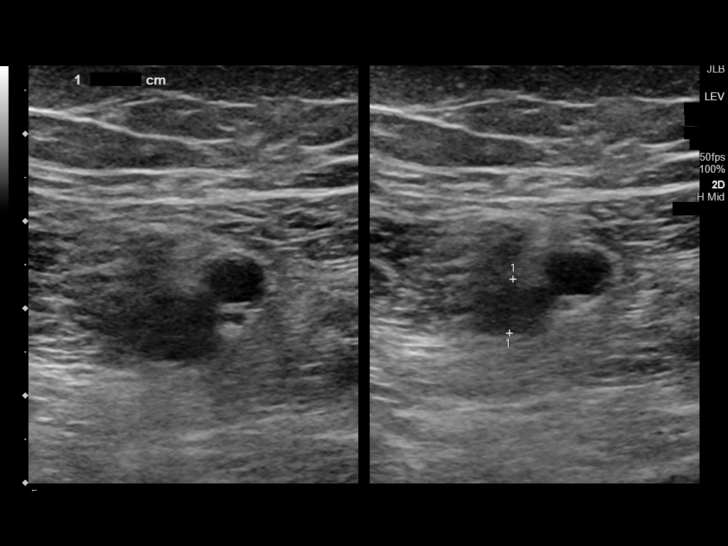
[im 19/44]
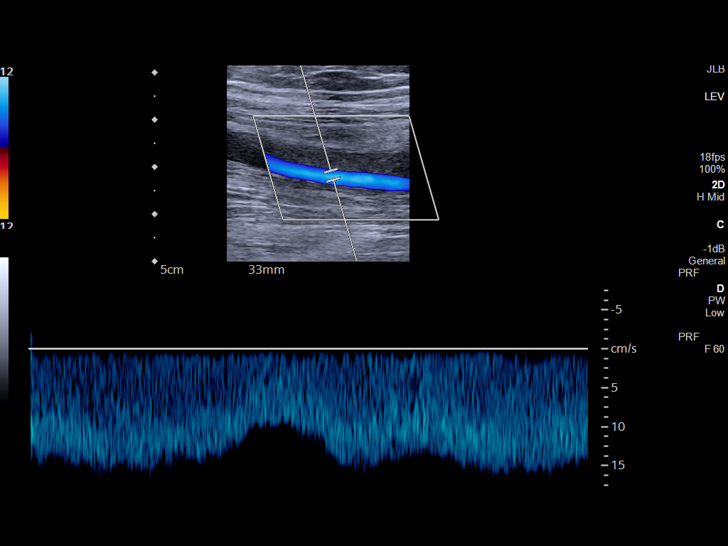
[im 23/44]
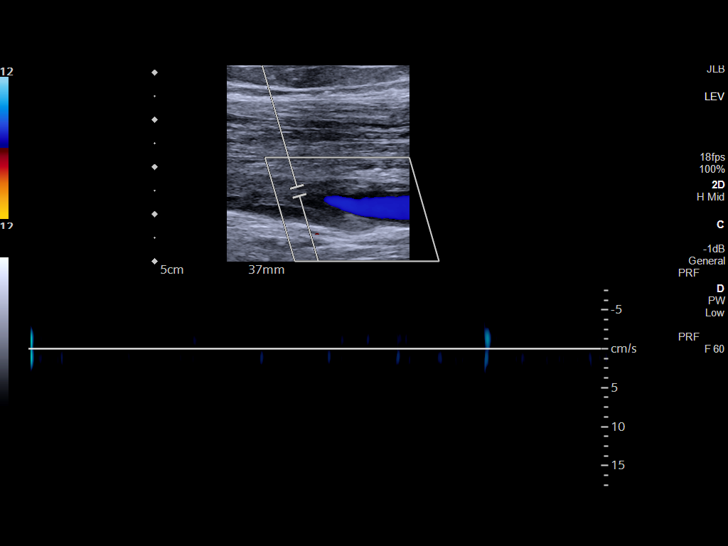
[im 25/44]
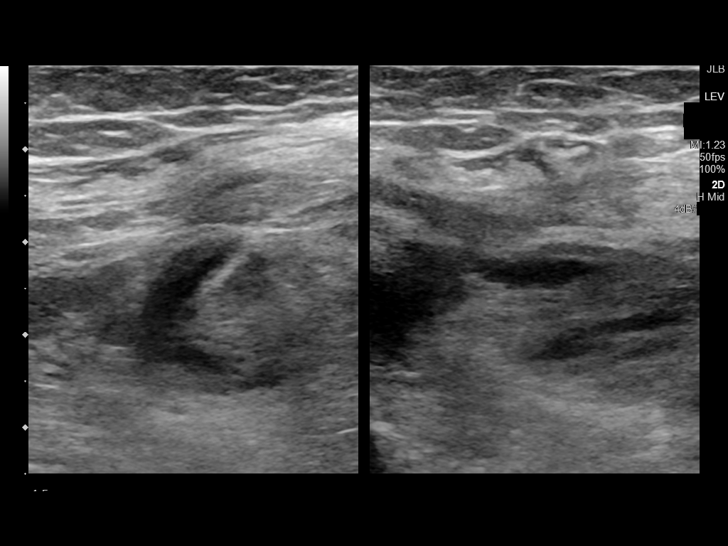
[im 29/44]
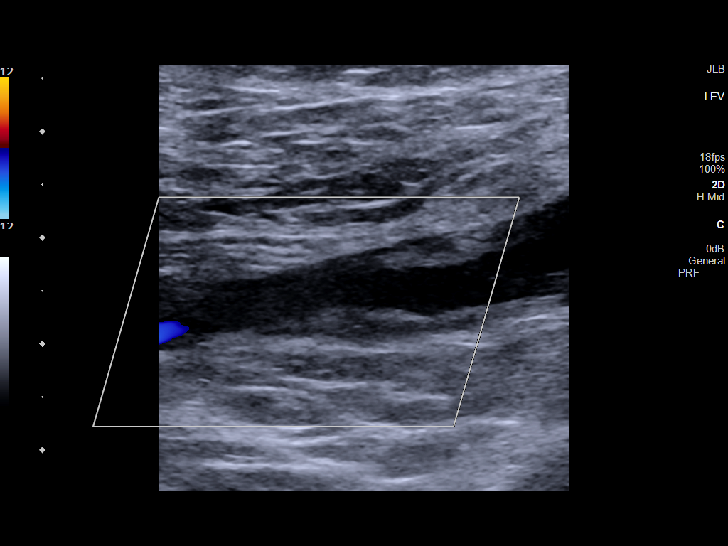
[im 32/44]
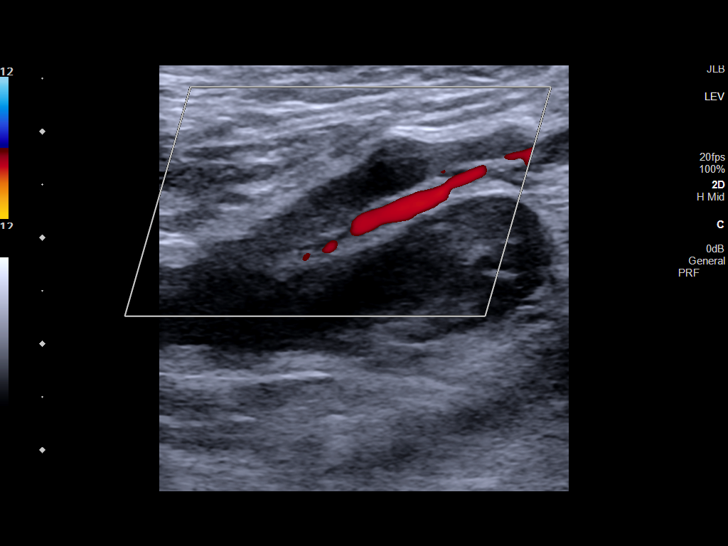
[im 36/44]
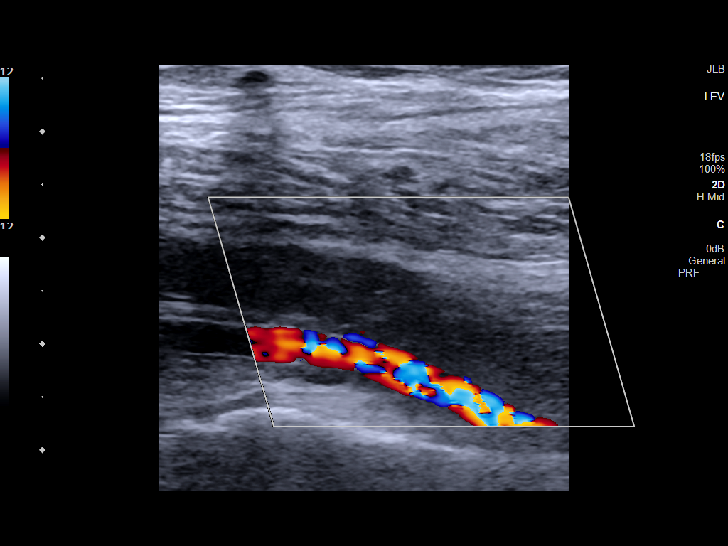
[im 40/44]
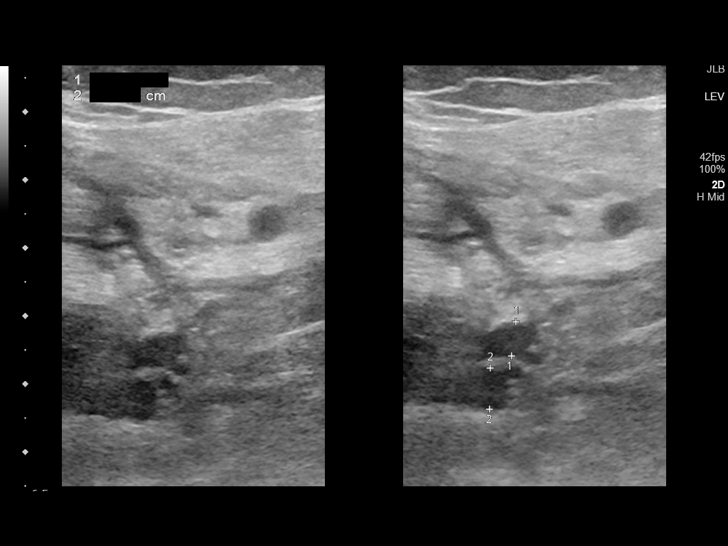
[im 44/44]
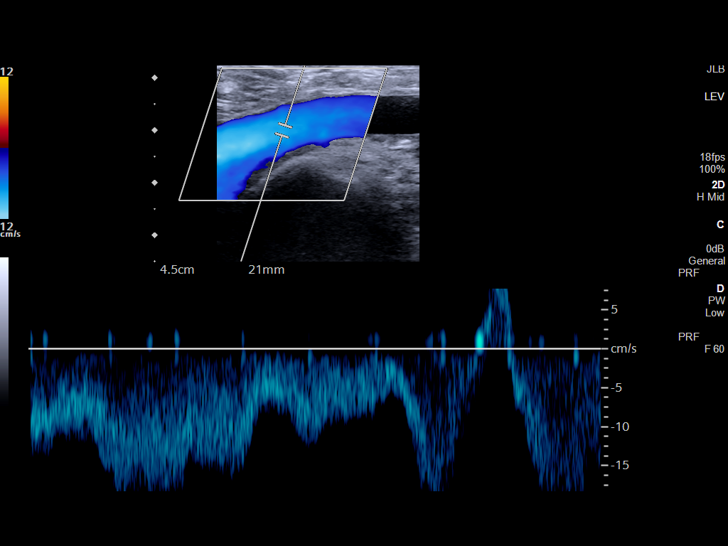

[13 of 24 positions shown; findings below may reference images not displayed]

FINDINGS: Contralateral Common Femoral Vein: Respiratory phasicity is normal
and symmetric with the symptomatic side. No evidence of thrombus.
Normal compressibility.

Common Femoral Vein: No evidence of thrombus. Normal
compressibility, respiratory phasicity and response to augmentation.

Saphenofemoral Junction: Echogenic thrombus seen at the
saphenofemoral junction.

Profunda Femoral Vein: No evidence of thrombus. Normal
compressibility and flow on color Doppler imaging.

Femoral Vein: Echogenic partially occlusive thrombus seen throughout
the proximal, mid, and distal right femoral vein.

Popliteal Vein: Echogenic occlusive thrombus seen throughout the
right popliteal vein.

Calf Veins: Echogenic occlusive thrombus seen within the right
peroneal and posterior tibial veins.

Superficial Great Saphenous Vein: No evidence of thrombus. Normal
compressibility.

Venous Reflux:  None.

Other Findings:  None.
IMPRESSION: Positive study with extensive acute DVT extending from the proximal
right femoral vein through the veins of the calf.

## 2020-10-25 IMAGING — CR DG LUMBAR SPINE COMPLETE 4+V
5 series · 5 of 5 positions shown · non-contrast
Comparison: None.

CLINICAL DATA: Pain following fall

EXAM:
LUMBAR SPINE - COMPLETE 4+ VIEW

[l-spine ap]
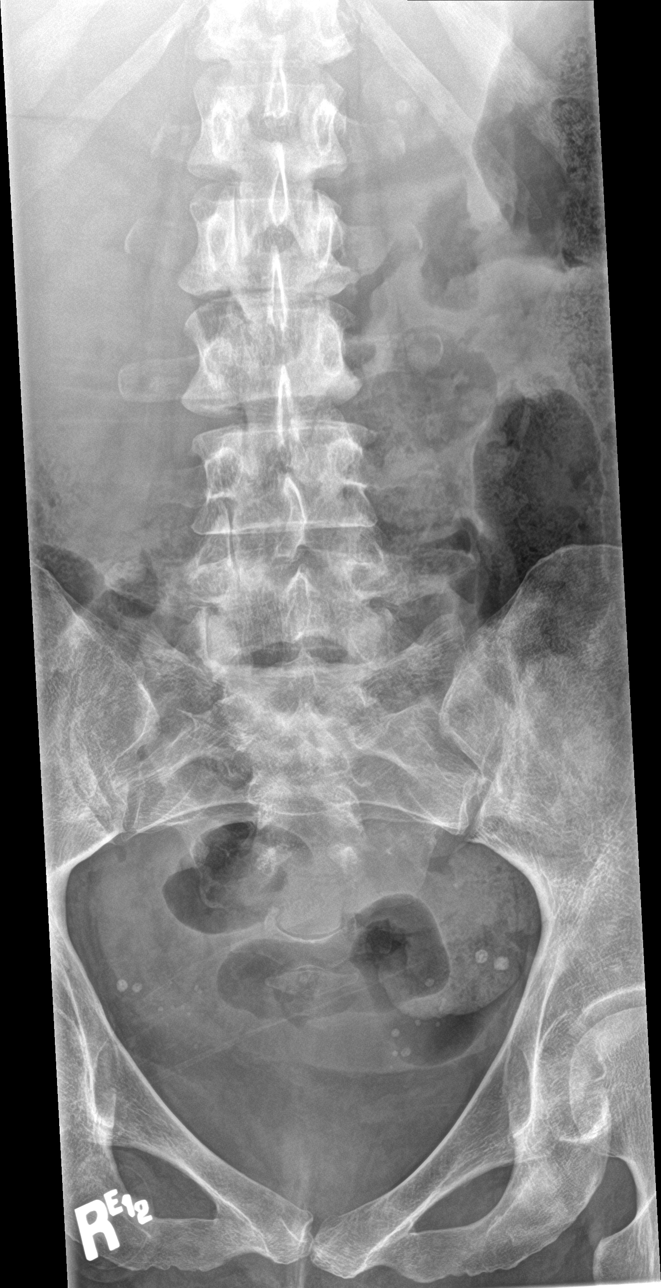

[l-spine obl (1 of 2)]
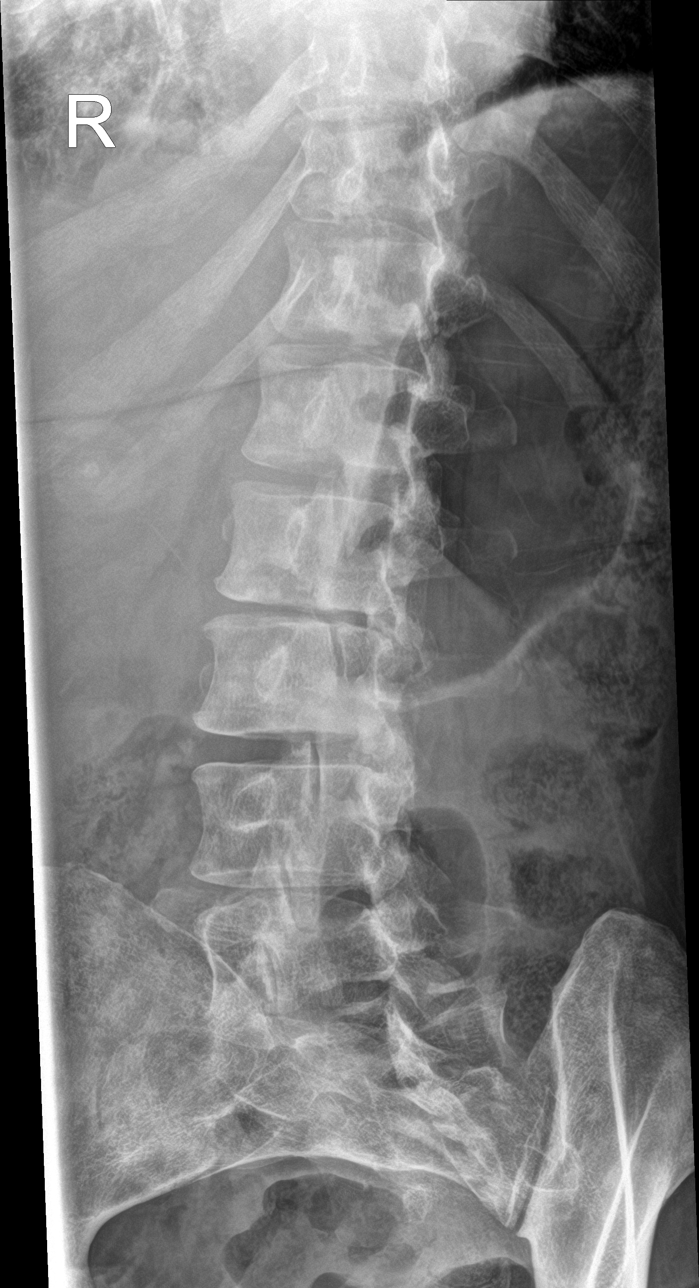

[l-spine obl (2 of 2)]
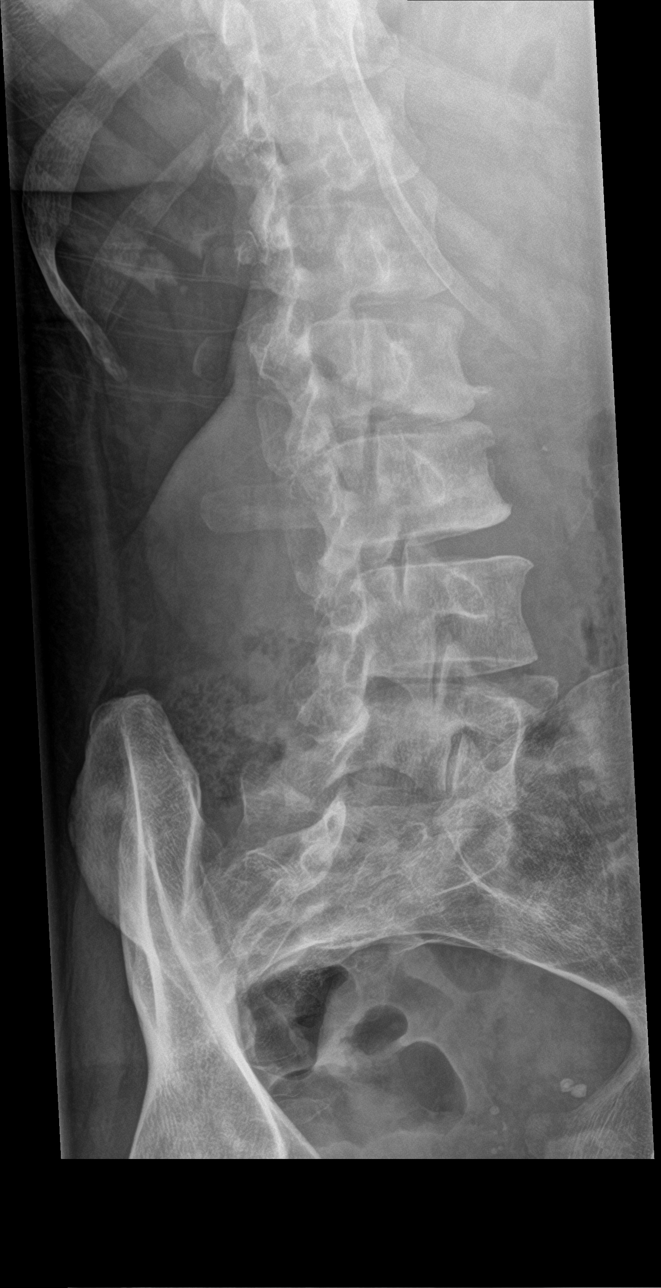

[l-spine lat]
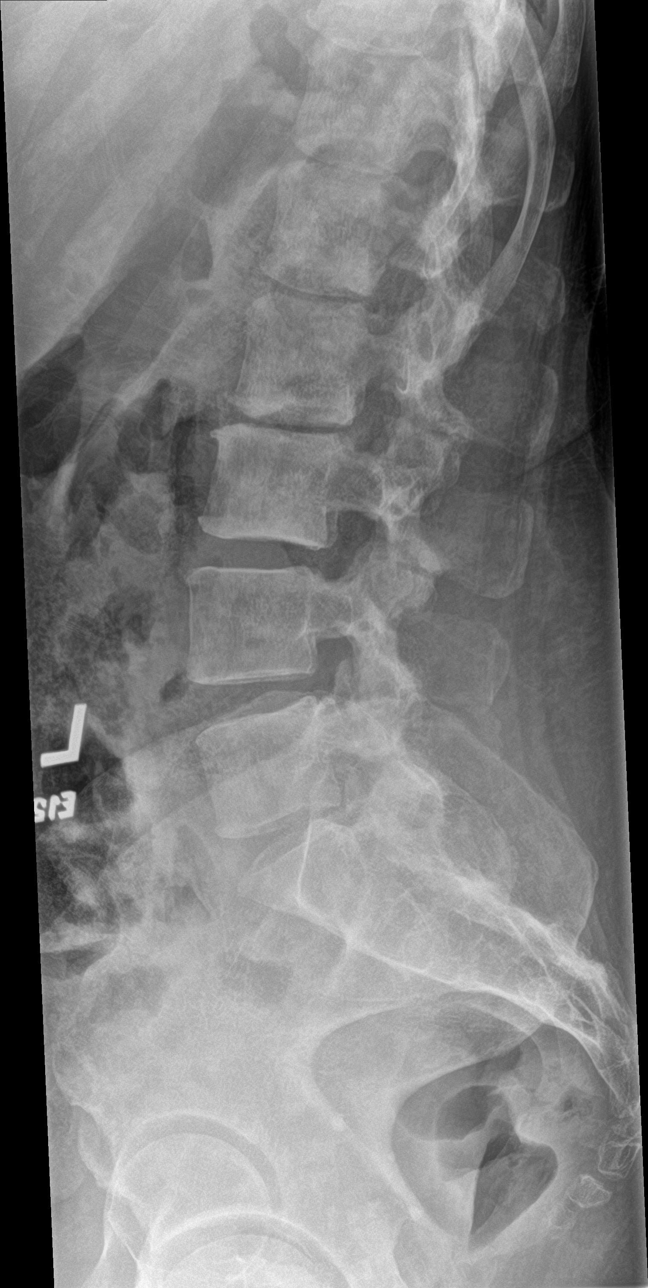

[l-spine spot]
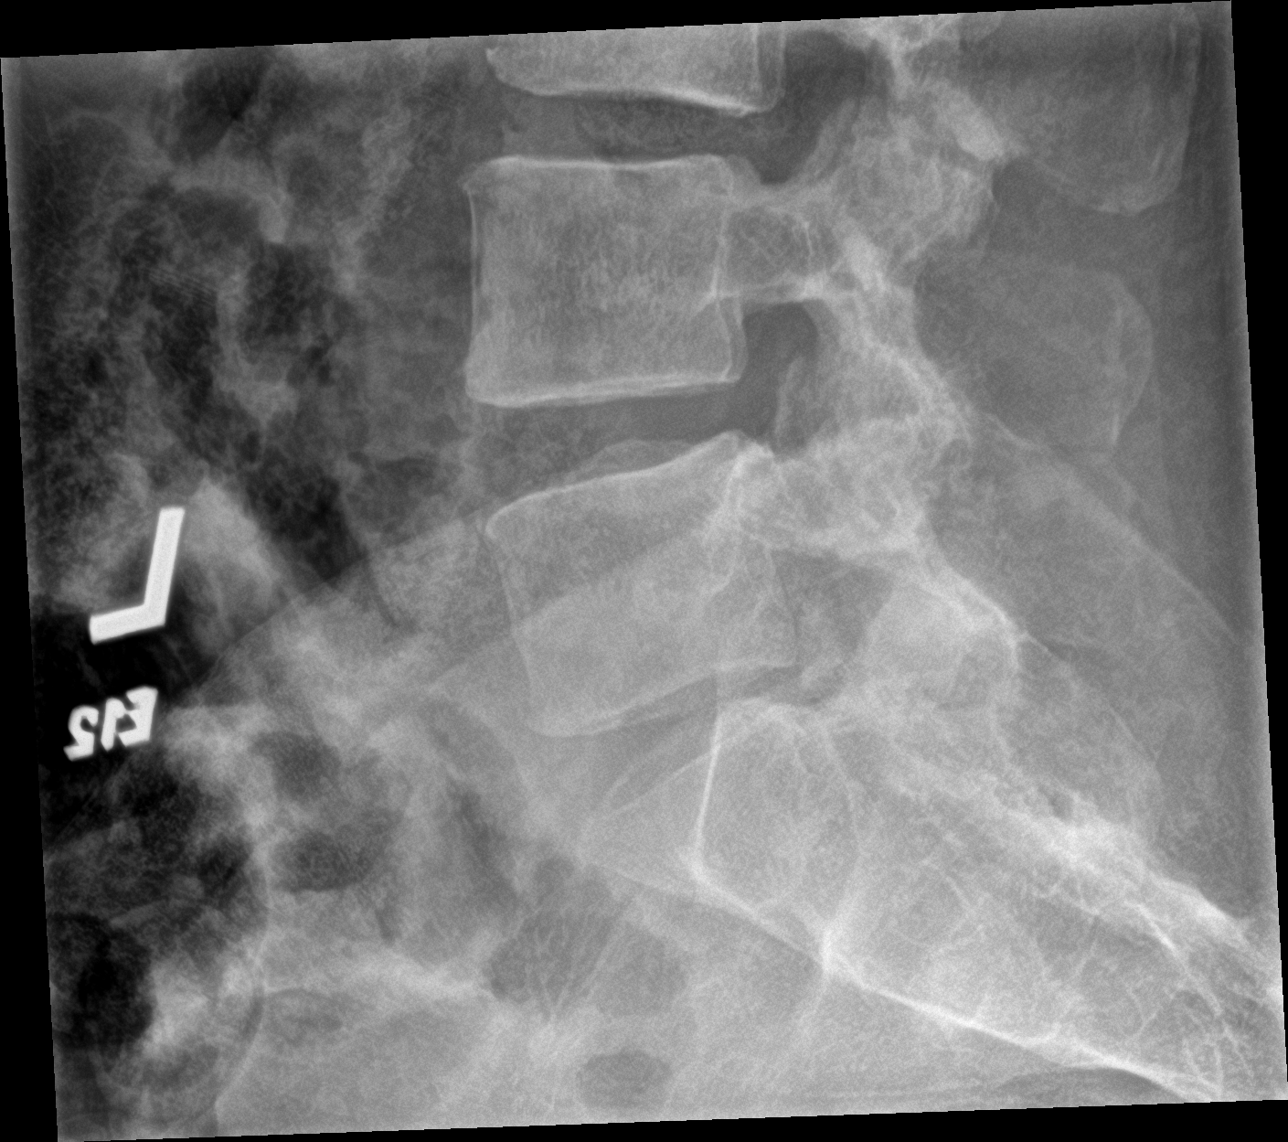

[5 of 5 positions shown; findings below may reference images not displayed]

FINDINGS: Frontal, lateral, spot lumbosacral lateral, and bilateral oblique
views were obtained. There are 5 non-rib-bearing lumbar type
vertebral bodies. There is no fracture or spondylolisthesis. The
disc spaces appear normal. There is no appreciable facet
arthropathy.
IMPRESSION: No fracture or spondylolisthesis.  No evident arthropathy.

## 2020-10-25 IMAGING — CR DG HIP (WITH OR WITHOUT PELVIS) 2-3V*R*
3 series · 3 of 3 positions shown · non-contrast
Comparison: PET CT scan [DATE].

CLINICAL DATA: History of metastatic breast cancer. Patient status
post fall. Initial encounter.

EXAM:
DG HIP (WITH OR WITHOUT PELVIS) 2-3V RIGHT

[pelvis ap]
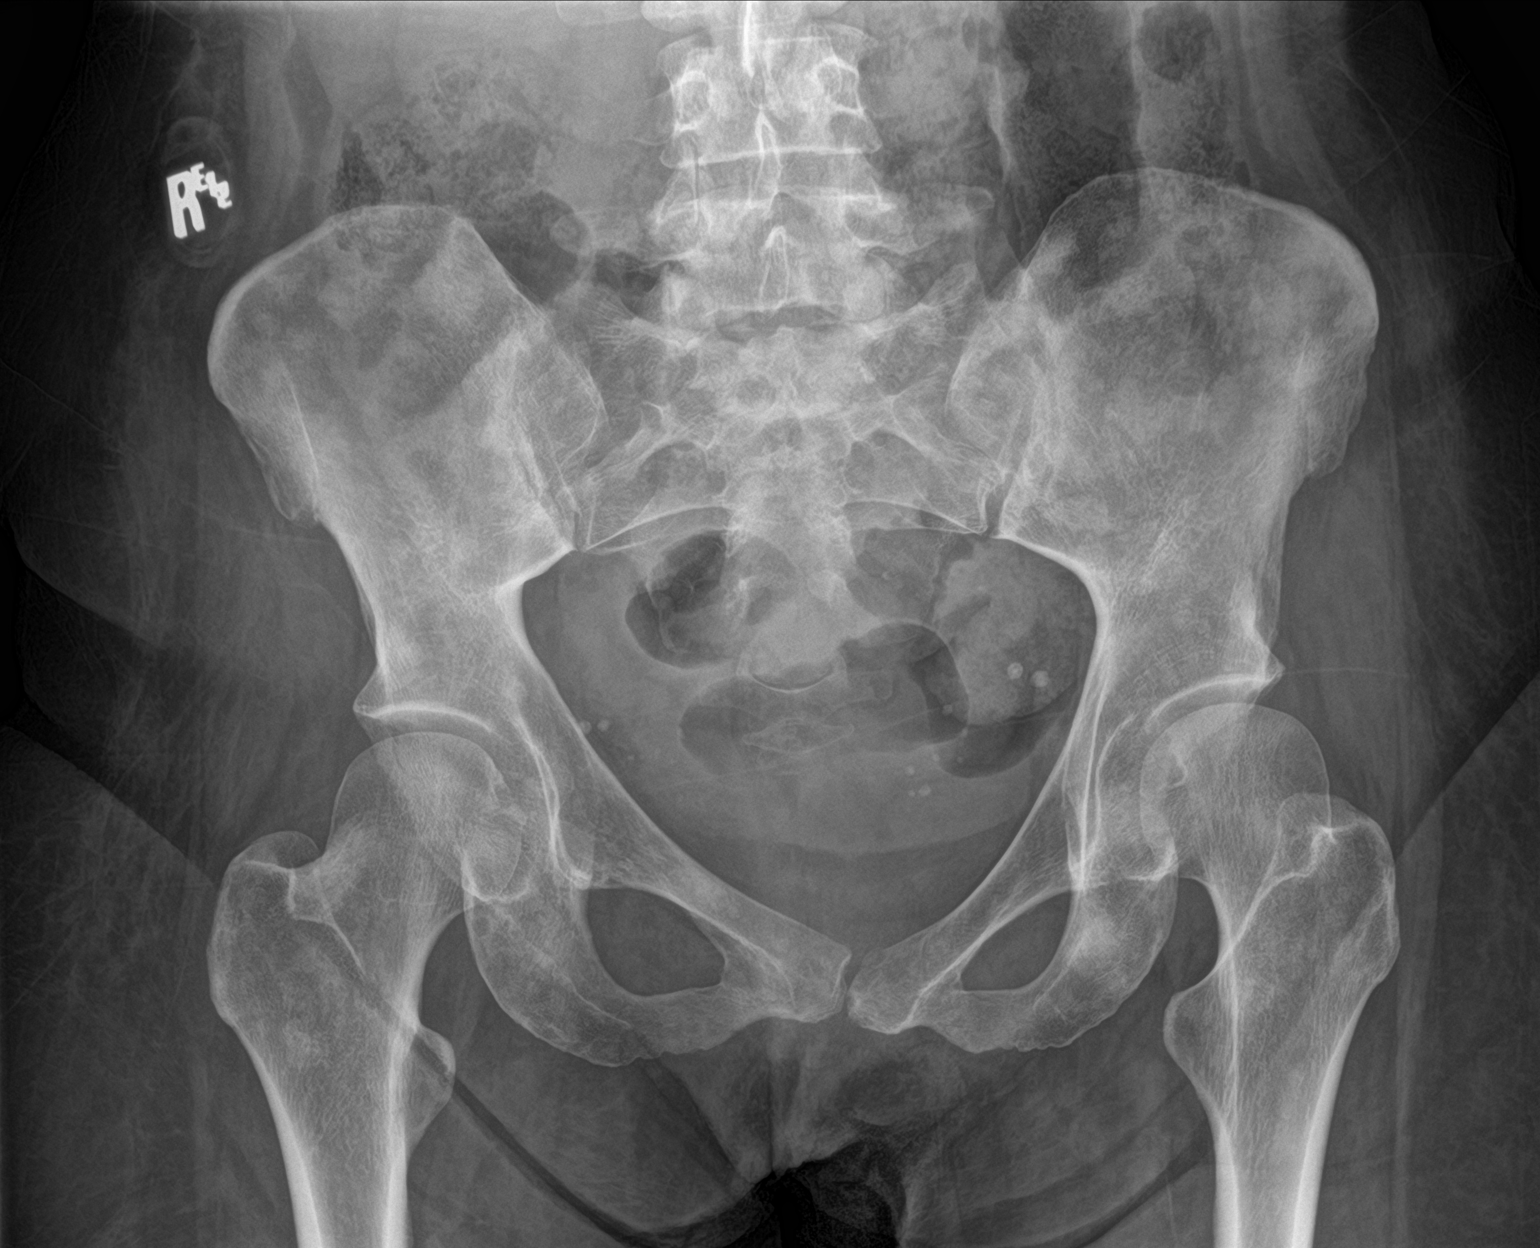

[hip ap]
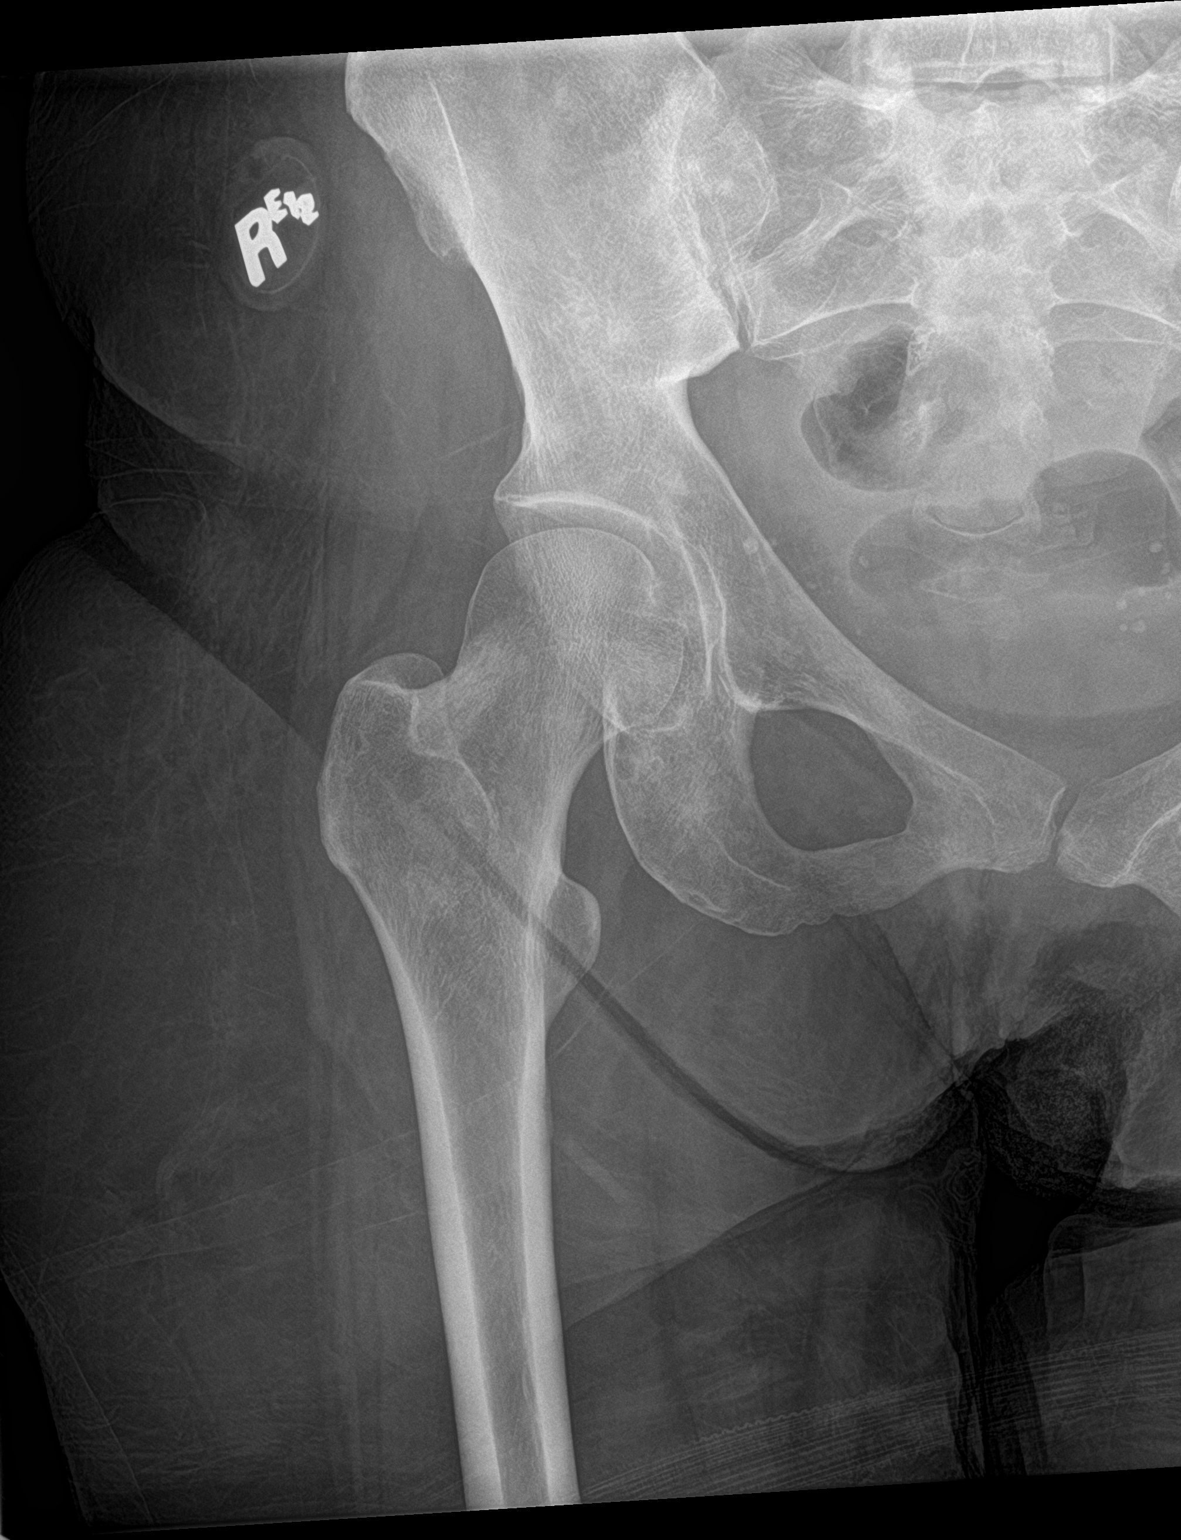

[hip lat]
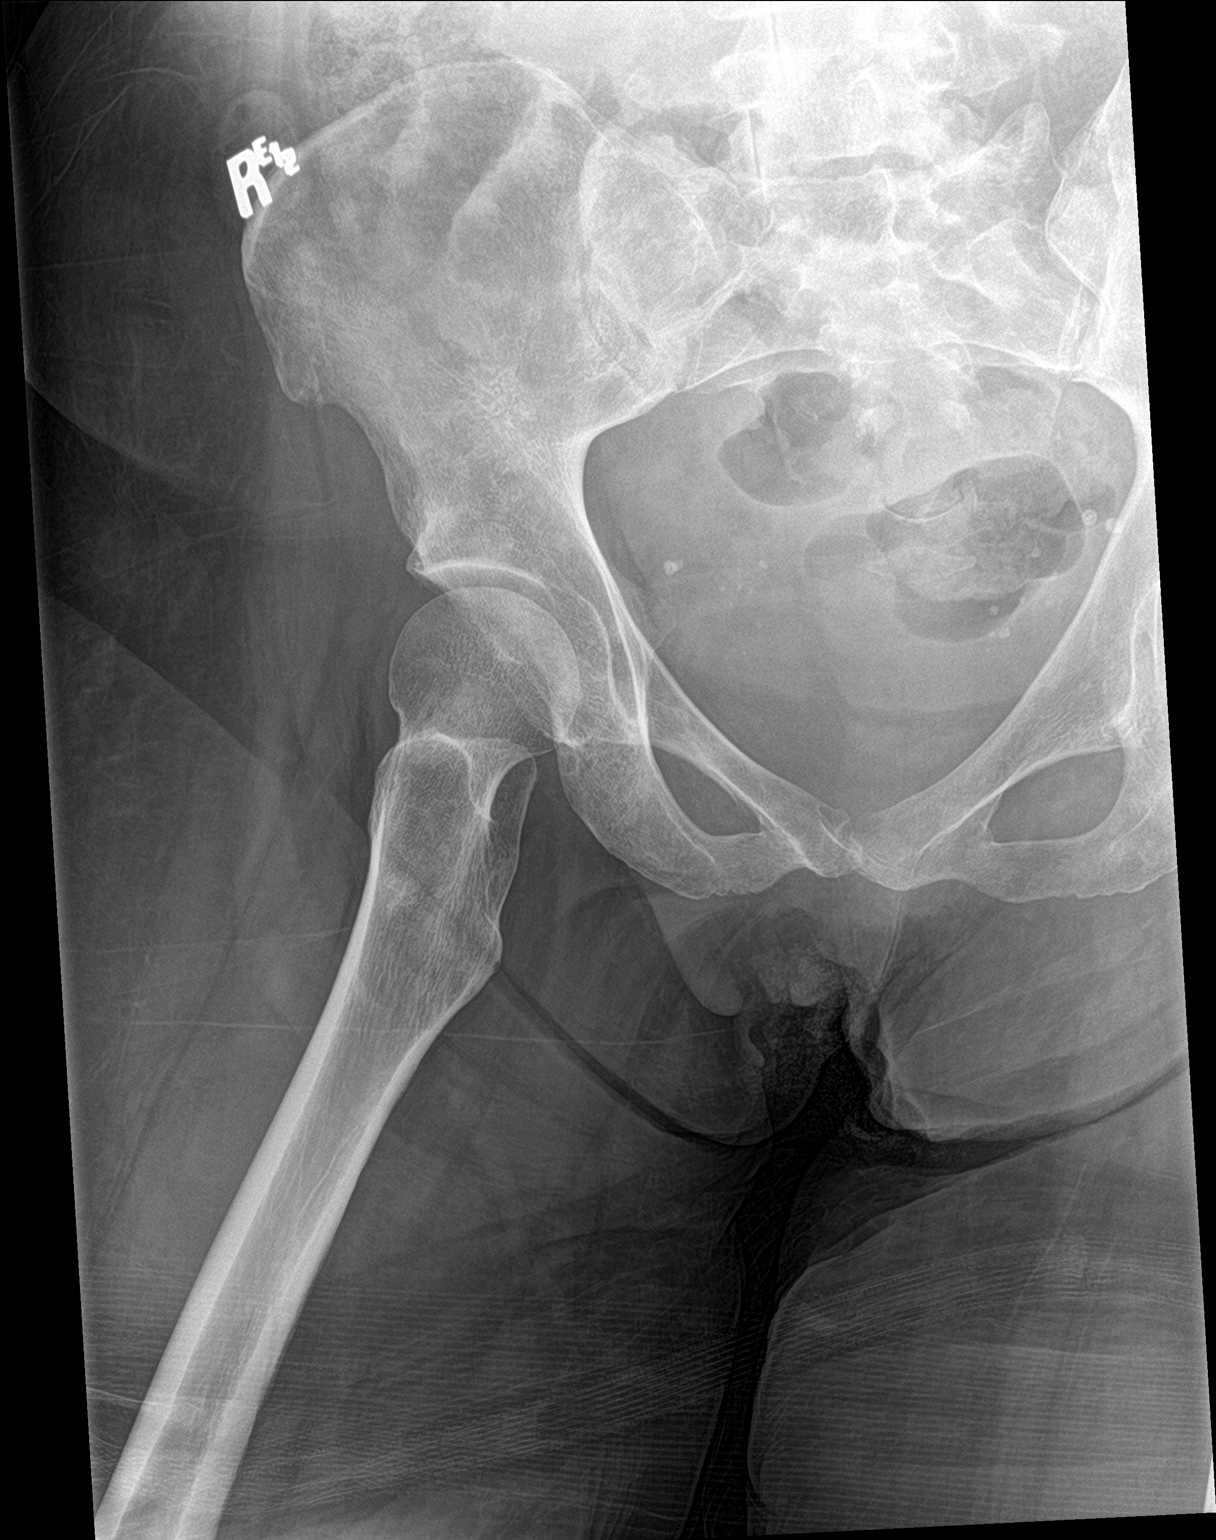

[3 of 3 positions shown; findings below may reference images not displayed]

FINDINGS: No acute bony or joint abnormality is identified. Scattered
sclerotic lesions are present in both hips and the pelvis consistent
with known metastatic breast cancer. Soft tissues are negative.
IMPRESSION: No acute abnormality.

Scattered sclerotic lesions consistent with known metastatic breast
cancer.

## 2020-10-25 IMAGING — MR MR HEAD WO/W CM
12 of 14 series · 37 of 48 positions shown · IV contrast (gadavist)
Comparison: MRI [DATE].
COMPARISON: MRI [DATE].

Addendum:
CLINICAL DATA: Neuro deficit, acute stroke suspected. Right leg
paresthesias and discomfort.

EXAM:
MRI HEAD WITHOUT AND WITH CONTRAST
TECHNIQUE: Multiplanar, multiecho pulse sequences of the brain and surrounding
structures were obtained without and with intravenous contrast.
CONTRAST:  8mL GADAVIST GADOBUTROL 1 MMOL/ML IV SOLN

[Series 5: ax dwi_tracew · axial · 3.0mm · 0.65mm/px · z∈[-160,-15]mm · 4 of 48 slices shown]
[im 1/48]
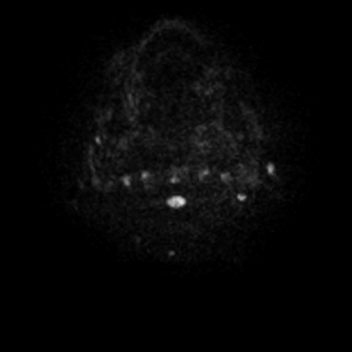
[im 16/48]
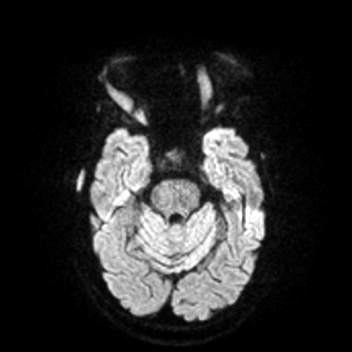
[im 32/48]
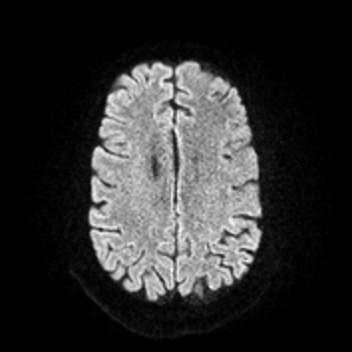
[im 48/48]
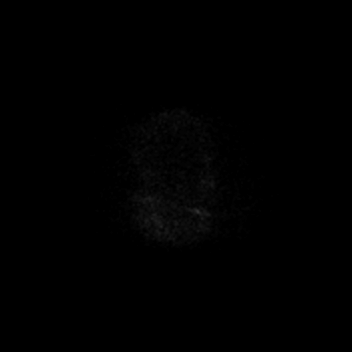

[Series 6: ax dwi_adc · axial · 3.0mm · 0.65mm/px · z∈[-160,-24]mm · 3 of 43 slices shown]
[im 1/43]
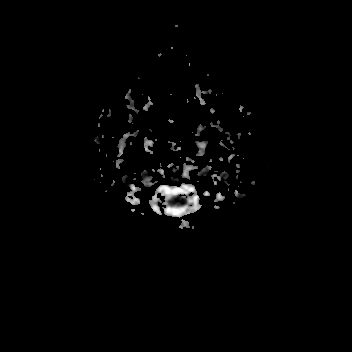
[im 22/43]
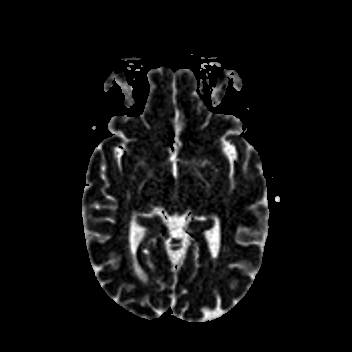
[im 43/43]
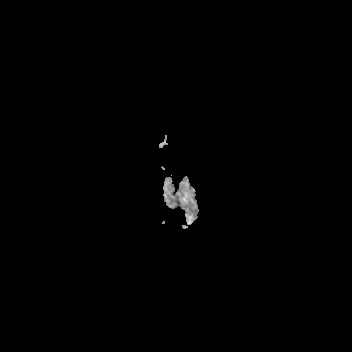

[Series 7: cor dwi_tracew · coronal · 5.0mm · 0.65mm/px · 2 of 40 slices shown]
[im 1/40]
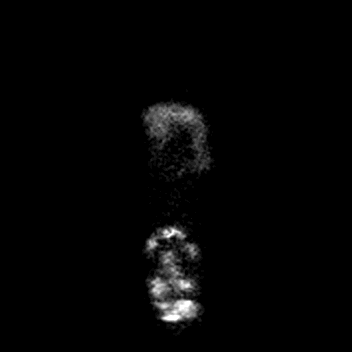
[im 40/40]
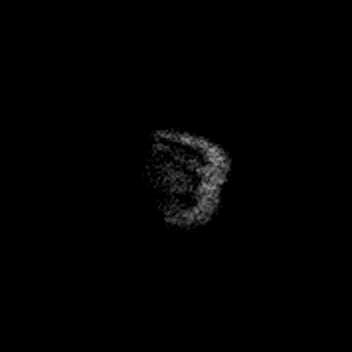

[Series 8: cor dwi_adc · coronal · 5.0mm · 0.65mm/px · 1 of 35 slices shown]
[im 1/35]
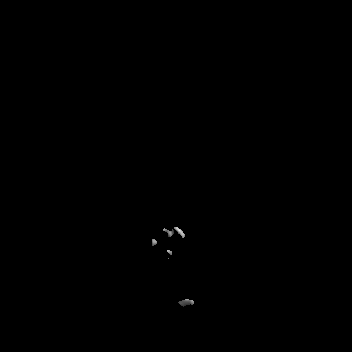

[Series 9: T1 · sagittal · 5.0mm · 0.62mm/px · 1 of 25 slices shown (1 of 2)]
[im 1/25]
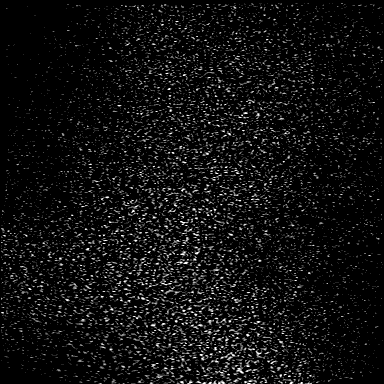

[Series 10: T2 · axial · 5.0mm · 0.53mm/px · z∈[-159,-12]mm · 2 of 27 slices shown]
[im 1/27]
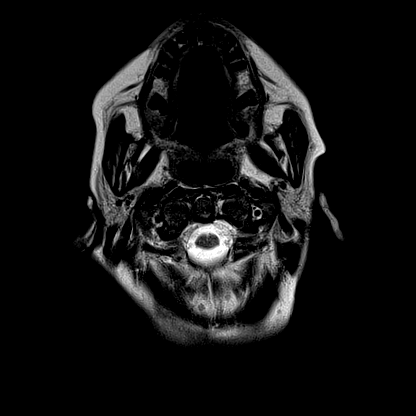
[im 27/27]
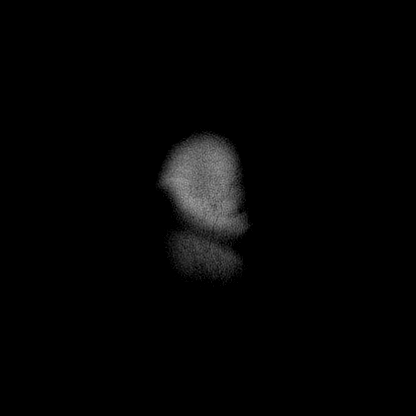

[Series 15: FLAIR · axial · 3.0mm · 0.53mm/px · z∈[-162,-10]mm · 3 of 55 slices shown]
[im 1/55]
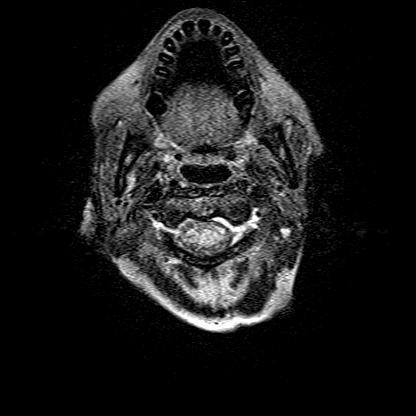
[im 28/55]
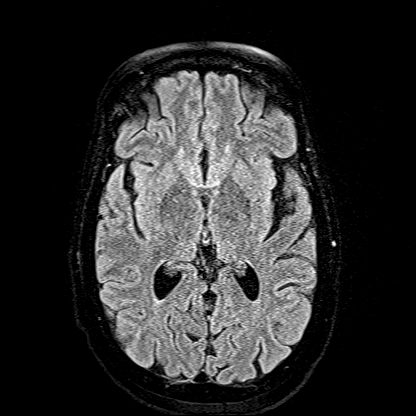
[im 55/55]
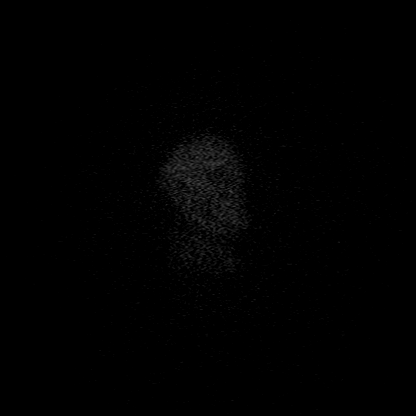

[Series 16: T1 · axial · 1.0mm · 0.98mm/px · z∈[-173,-9]mm · 8 of 176 slices shown (2 of 2)]
[im 1/176]
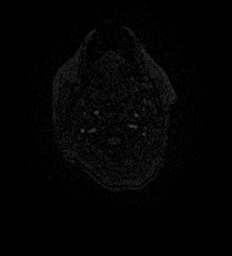
[im 20/176]
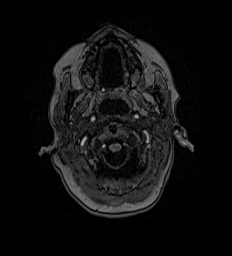
[im 59/176]
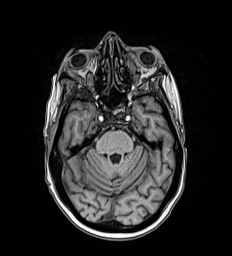
[im 78/176]
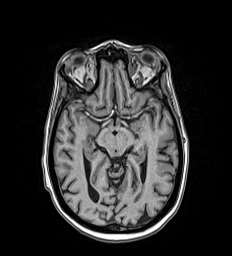
[im 98/176]
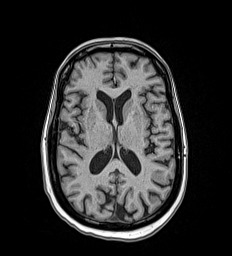
[im 117/176]
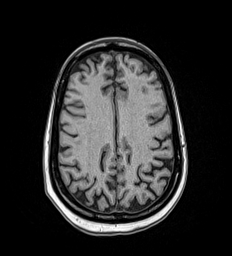
[im 156/176]
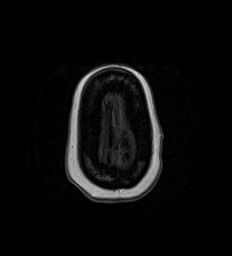
[im 176/176]
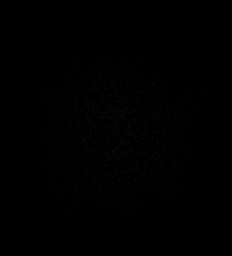

[Series 17: T2 post-contrast · coronal · 5.0mm · 0.57mm/px · 2 of 30 slices shown]
[im 1/30]
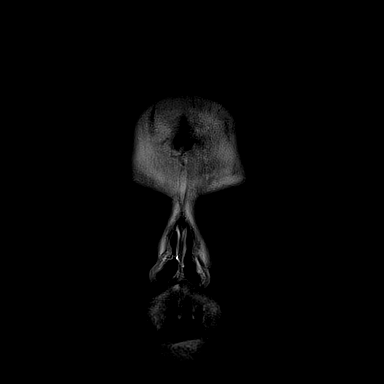
[im 30/30]
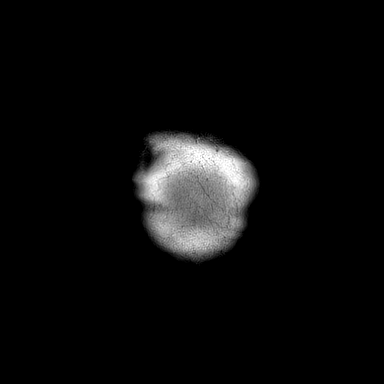

[Series 18: T1 post-contrast · axial · 1.0mm · 0.98mm/px · z∈[-173,-9]mm · 8 of 176 slices shown (1 of 3)]
[im 1/176]
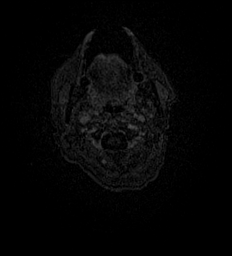
[im 20/176]
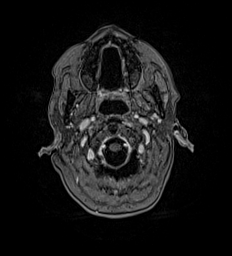
[im 59/176]
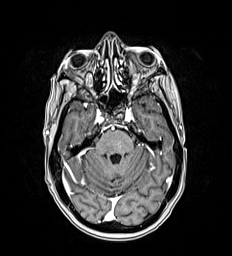
[im 78/176]
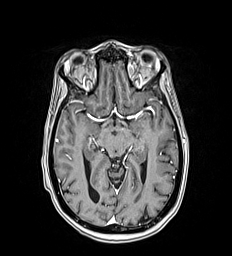
[im 98/176]
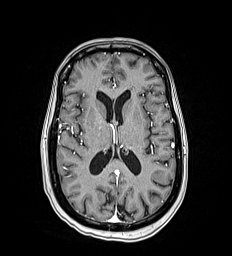
[im 117/176]
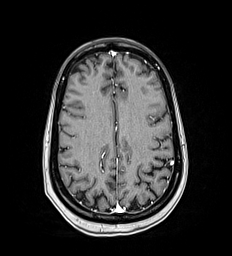
[im 156/176]
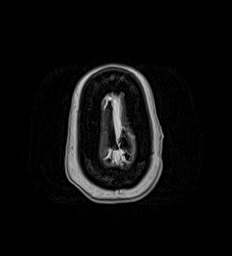
[im 176/176]
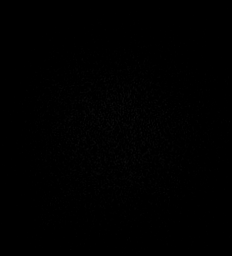

[Series 19: T1 post-contrast · coronal · 5.0mm · 0.57mm/px · 2 of 30 slices shown (2 of 3)]
[im 1/30]
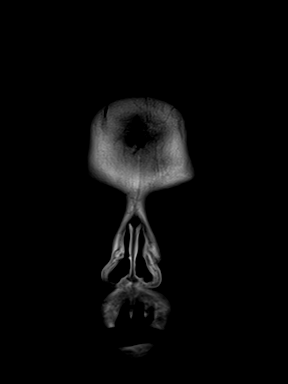
[im 30/30]
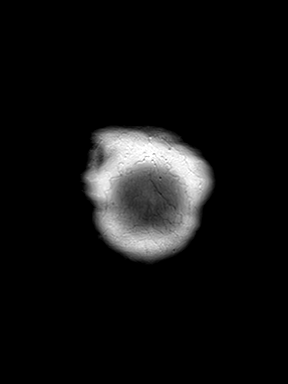

[Series 20: T1 post-contrast · sagittal · 5.0mm · 0.62mm/px · 1 of 25 slices shown (3 of 3)]
[im 1/25]
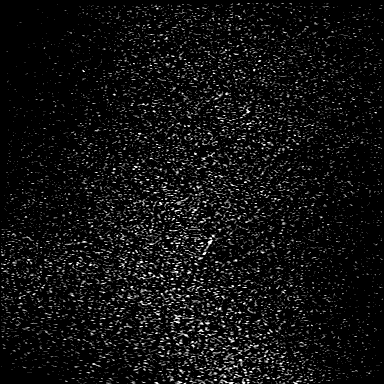

[37 of 48 positions shown; findings below may reference images not displayed]

FINDINGS: Brain: No acute infarction, hemorrhage, hydrocephalus, extra-axial
collection or intracranial mass lesion. No abnormal intracranial
enhancement.

Vascular: Major arterial flow voids are maintained at the skull
base.

Skull and upper cervical spine: Increased size/conspicuity of
scattered T1 hypointense metastasis in the upper cervical spine.
Additionally, increased size of the clival skull metastasis, now
measuring 1.4 cm. T1 hypointensity of the right occipital calvarium
appears similar to prior and is concerning for osseous metastatic
disease.

Sinuses/Orbits: Interval development of marked enlargement of
multiple extraocular muscles, including bulky enlargement of the
left medial and lateral rectus muscles and right lateral rectus
muscle. Additionally, there is evidence of involvement of the right
medial rectus muscle anteriorly (series 17, image 26 and series 5,
image 19), superior rectus musculature anteriorly bilaterally
(series [DATE]; series 5, image 22) and the inferior rectus muscle
on the right posteriorly (series 17, image 24; series 5, image 16).
The enlarged extra-ocular muscles demonstrate T2 hypointensity and
restricted diffusion, suggesting high cellularity. There is
involvement of the muscle bodies as well as tendinous insertions in
areas.

Other: No sizable mastoid effusions.
IMPRESSION: 1. No evidence of acute infarct or intracranial brain metastasis.
2. Interval development of marked enlargement of multiple bilateral
extraocular muscles, detailed above and most prominently involving
the medial and lateral rectus. The enlarged extra-ocular muscles
demonstrate T2 hypointensity and restricted diffusion, suggesting
high cellularity. While nonspecific, this finding can be seen with
orbital lymphoma. Idiopathic orbital inflammatory pseudotumor and
IgG4-related orbital disease are additional differential
considerations. Metastatic breast cancer was considered given the
clinical history, but thought unlikely given the diffuse nature of
the process.
3. Partially imaged metastases in the upper cervical spine and
clival metastasis appear larger and more conspicuous, concerning for
progression of bony metastatic disease. Similar T1 hypointensity
right occipital calvarium, concerning for an additional site of
osseous metastatic disease.

Findings discussed with Dr. BONE at [DATE] p.m. via telephone.

ADDENDUM:
The conclusion mentions that breast cancer is thought unlikely for
the orbital findings. On further review, given the patient's
widespread metastatic disease and given that breast cancer does have
a propensity for orbital metastasis (occasionally
multifocal/bilateral), metastatic breast cancer is not excluded. The
additional differential considerations mentioned in the conclusion
remain possibilities, particularly orbital lymphoma given the
restricted diffusion. Consider ophthalmology consultation.

Findings discussed with Dr. BONE via telephone at [DATE].

*** End of Addendum ***
FINDINGS: Brain: No acute infarction, hemorrhage, hydrocephalus, extra-axial
collection or intracranial mass lesion. No abnormal intracranial
enhancement.

Vascular: Major arterial flow voids are maintained at the skull
base.

Skull and upper cervical spine: Increased size/conspicuity of
scattered T1 hypointense metastasis in the upper cervical spine.
Additionally, increased size of the clival skull metastasis, now
measuring 1.4 cm. T1 hypointensity of the right occipital calvarium
appears similar to prior and is concerning for osseous metastatic
disease.

Sinuses/Orbits: Interval development of marked enlargement of
multiple extraocular muscles, including bulky enlargement of the
left medial and lateral rectus muscles and right lateral rectus
muscle. Additionally, there is evidence of involvement of the right
medial rectus muscle anteriorly (series 17, image 26 and series 5,
image 19), superior rectus musculature anteriorly bilaterally
(series [DATE]; series 5, image 22) and the inferior rectus muscle
on the right posteriorly (series 17, image 24; series 5, image 16).
The enlarged extra-ocular muscles demonstrate T2 hypointensity and
restricted diffusion, suggesting high cellularity. There is
involvement of the muscle bodies as well as tendinous insertions in
areas.

Other: No sizable mastoid effusions.
IMPRESSION: 1. No evidence of acute infarct or intracranial brain metastasis.
2. Interval development of marked enlargement of multiple bilateral
extraocular muscles, detailed above and most prominently involving
the medial and lateral rectus. The enlarged extra-ocular muscles
demonstrate T2 hypointensity and restricted diffusion, suggesting
high cellularity. While nonspecific, this finding can be seen with
orbital lymphoma. Idiopathic orbital inflammatory pseudotumor and
IgG4-related orbital disease are additional differential
considerations. Metastatic breast cancer was considered given the
clinical history, but thought unlikely given the diffuse nature of
the process.
3. Partially imaged metastases in the upper cervical spine and
clival metastasis appear larger and more conspicuous, concerning for
progression of bony metastatic disease. Similar T1 hypointensity
right occipital calvarium, concerning for an additional site of
osseous metastatic disease.

Findings discussed with Dr. BONE at [DATE] p.m. via telephone.

## 2020-10-25 IMAGING — DX DG CHEST 1V PORT
1 series · 1 of 1 positions shown · non-contrast
Comparison: Single-view of the chest [DATE] and [DATE]. CT
chest [DATE].

CLINICAL DATA: Chest pain.

EXAM:
PORTABLE CHEST 1 VIEW

[chest ap]
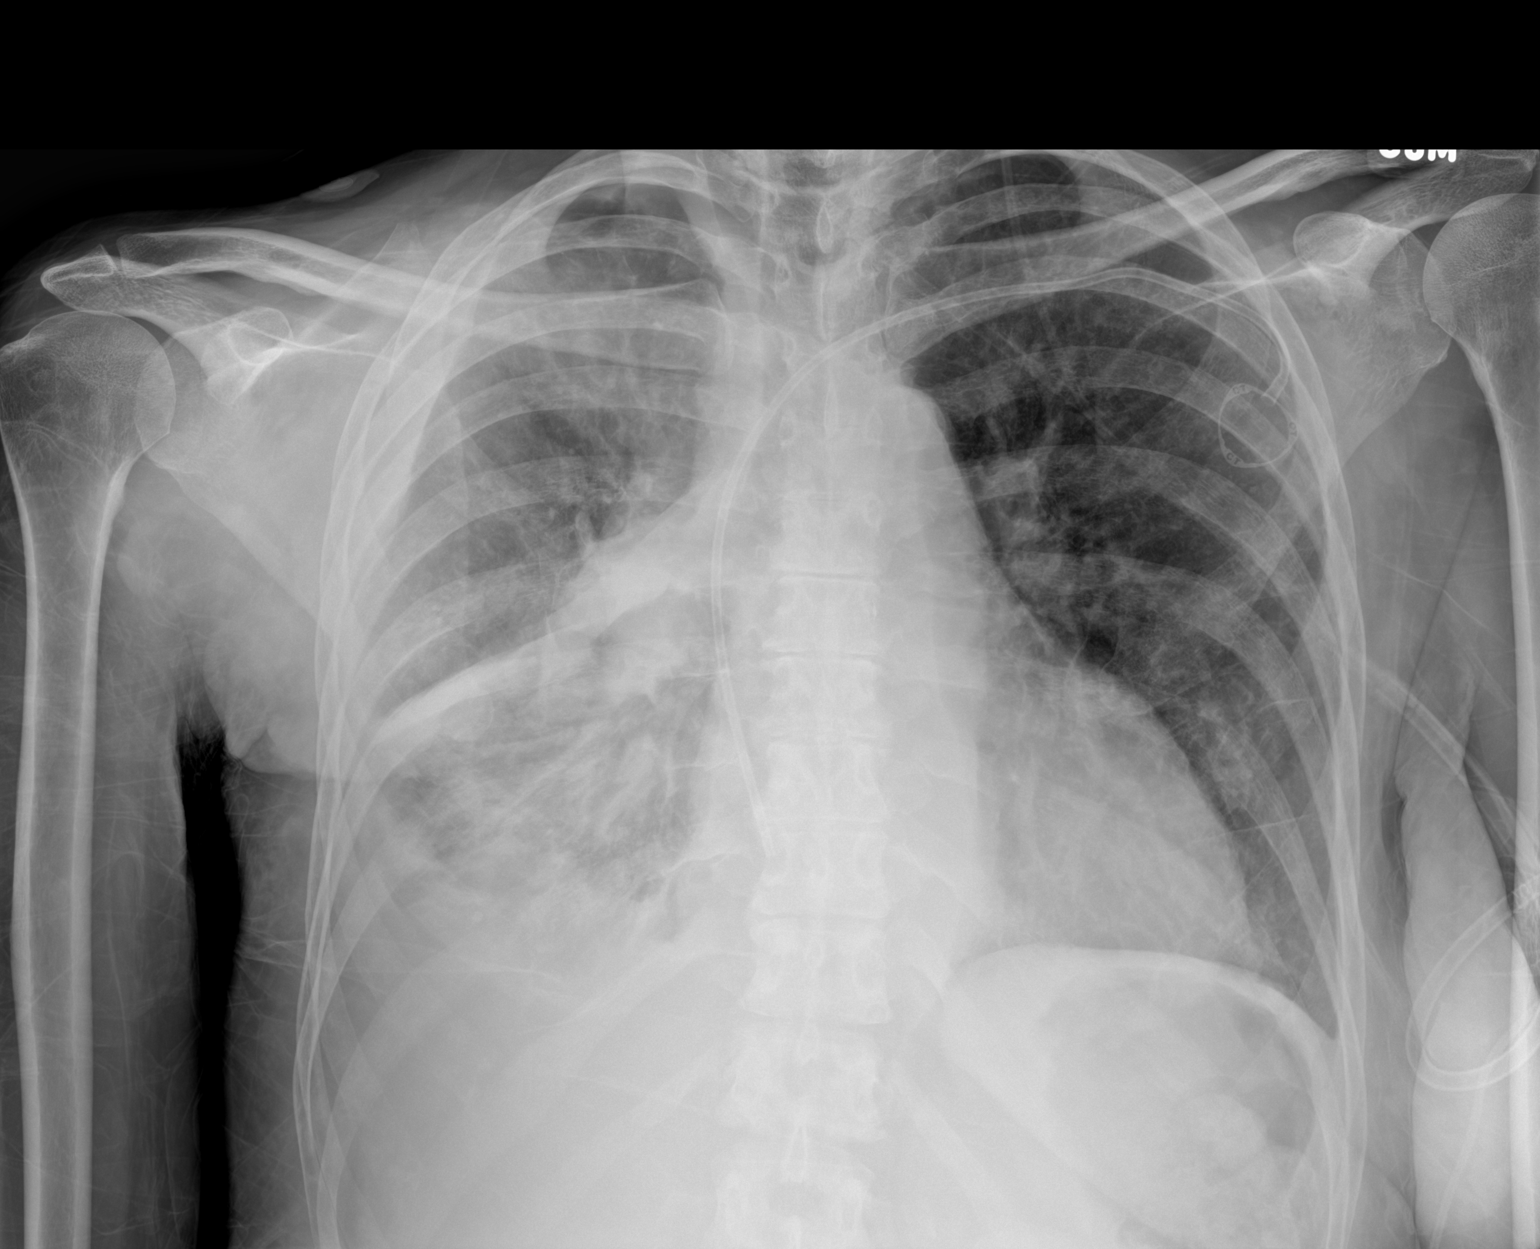

[1 of 1 positions shown; findings below may reference images not displayed]

FINDINGS: Right pleural effusion and opacities projecting over the right chest
consistent with known chest wall and intrathoracic metastatic breast
carcinoma again seen. Scattered opacities in the left chest are also
again seen. Aeration has improved bilaterally since the most recent
exam. Small right pleural effusion is noted. The left lung is clear.
Heart size is normal.
IMPRESSION: Aeration in both lungs appears improved compared to the most recent
study in this patient with extensive metastatic breast cancer. No
new abnormality.

## 2020-10-25 MED ORDER — LACTATED RINGERS IV BOLUS
1000.0000 mL | Freq: Once | INTRAVENOUS | Status: AC
  Administered 2020-10-25: 1000 mL via INTRAVENOUS

## 2020-10-25 MED ORDER — ADULT MULTIVITAMIN W/MINERALS CH
1.0000 | ORAL_TABLET | Freq: Every day | ORAL | Status: DC
  Administered 2020-10-25 – 2020-11-04 (×11): 1 via ORAL
  Filled 2020-10-25 (×11): qty 1

## 2020-10-25 MED ORDER — ZINC SULFATE 220 (50 ZN) MG PO CAPS
220.0000 mg | ORAL_CAPSULE | Freq: Every day | ORAL | Status: DC
  Administered 2020-10-25 – 2020-11-04 (×10): 220 mg via ORAL
  Filled 2020-10-25 (×11): qty 1

## 2020-10-25 MED ORDER — PREDNISONE 20 MG PO TABS
40.0000 mg | ORAL_TABLET | Freq: Every day | ORAL | Status: DC
Start: 2020-10-26 — End: 2020-10-25

## 2020-10-25 MED ORDER — MORPHINE SULFATE (PF) 4 MG/ML IV SOLN
4.0000 mg | INTRAVENOUS | Status: DC | PRN
  Administered 2020-10-28: 4 mg via INTRAVENOUS
  Filled 2020-10-25: qty 1

## 2020-10-25 MED ORDER — ALBUTEROL SULFATE HFA 108 (90 BASE) MCG/ACT IN AERS
2.0000 | INHALATION_SPRAY | RESPIRATORY_TRACT | Status: DC | PRN
  Filled 2020-10-25: qty 6.7

## 2020-10-25 MED ORDER — METOPROLOL TARTRATE 50 MG PO TABS
50.0000 mg | ORAL_TABLET | Freq: Two times a day (BID) | ORAL | Status: DC
  Administered 2020-10-25 – 2020-11-04 (×20): 50 mg via ORAL
  Filled 2020-10-25 (×21): qty 1

## 2020-10-25 MED ORDER — ASCORBIC ACID 500 MG PO TABS
500.0000 mg | ORAL_TABLET | Freq: Every day | ORAL | Status: DC
  Administered 2020-10-25 – 2020-11-04 (×11): 500 mg via ORAL
  Filled 2020-10-25 (×11): qty 1

## 2020-10-25 MED ORDER — ACETAMINOPHEN 650 MG RE SUPP: 325.0000 mg | Freq: Four times a day (QID) | RECTAL | Status: AC | PRN

## 2020-10-25 MED ORDER — SODIUM CHLORIDE 0.9 % IV SOLN
200.0000 mg | Freq: Once | INTRAVENOUS | Status: AC
  Administered 2020-10-25: 200 mg via INTRAVENOUS
  Filled 2020-10-25: qty 40

## 2020-10-25 MED ORDER — ENSURE ENLIVE PO LIQD: 237.0000 mL | Freq: Three times a day (TID) | ORAL | Status: DC

## 2020-10-25 MED ORDER — CALCIUM CARBONATE 1250 (500 CA) MG PO TABS
1250.0000 mg | ORAL_TABLET | Freq: Every day | ORAL | Status: DC
  Administered 2020-10-26 – 2020-11-04 (×10): 1250 mg via ORAL
  Filled 2020-10-25 (×12): qty 1

## 2020-10-25 MED ORDER — DEXAMETHASONE SODIUM PHOSPHATE 10 MG/ML IJ SOLN
6.0000 mg | INTRAMUSCULAR | Status: DC
Start: 2020-10-25 — End: 2020-10-26
  Administered 2020-10-25 – 2020-10-26 (×2): 6 mg via INTRAVENOUS
  Filled 2020-10-25 (×2): qty 1

## 2020-10-25 MED ORDER — DRONABINOL 5 MG PO CAPS: 5.0000 mg | ORAL_CAPSULE | Freq: Two times a day (BID) | ORAL | Status: DC

## 2020-10-25 MED ORDER — LACTATED RINGERS IV SOLN: INTRAVENOUS | Status: DC

## 2020-10-25 MED ORDER — ENOXAPARIN SODIUM 40 MG/0.4ML ~~LOC~~ SOLN
40.0000 mg | SUBCUTANEOUS | Status: DC
  Administered 2020-10-25: 40 mg via SUBCUTANEOUS
  Filled 2020-10-25: qty 0.4

## 2020-10-25 MED ORDER — ONDANSETRON HCL 4 MG/2ML IJ SOLN: 4.0000 mg | Freq: Three times a day (TID) | INTRAMUSCULAR | Status: DC | PRN

## 2020-10-25 MED ORDER — MAGIC MOUTHWASH W/LIDOCAINE
15.0000 mL | Freq: Three times a day (TID) | ORAL | Status: DC
  Administered 2020-10-26 – 2020-11-04 (×19): 15 mL via ORAL
  Filled 2020-10-25 (×35): qty 15

## 2020-10-25 MED ORDER — GUAIFENESIN-DM 100-10 MG/5ML PO SYRP: 10.0000 mL | ORAL_SOLUTION | ORAL | Status: DC | PRN

## 2020-10-25 MED ORDER — ALBUTEROL SULFATE (2.5 MG/3ML) 0.083% IN NEBU: 2.5000 mg | INHALATION_SOLUTION | Freq: Four times a day (QID) | RESPIRATORY_TRACT | Status: DC | PRN

## 2020-10-25 MED ORDER — OXYCODONE-ACETAMINOPHEN 7.5-325 MG PO TABS
1.0000 | ORAL_TABLET | Freq: Three times a day (TID) | ORAL | Status: DC | PRN
  Administered 2020-10-25: 1 via ORAL
  Filled 2020-10-25: qty 1

## 2020-10-25 MED ORDER — INSULIN ASPART 100 UNIT/ML ~~LOC~~ SOLN: 0.0000 [IU] | Freq: Every day | SUBCUTANEOUS | Status: DC

## 2020-10-25 MED ORDER — DRONABINOL 2.5 MG PO CAPS
5.0000 mg | ORAL_CAPSULE | Freq: Two times a day (BID) | ORAL | Status: DC
  Administered 2020-10-26 – 2020-11-04 (×15): 5 mg via ORAL
  Filled 2020-10-25 (×14): qty 2
  Filled 2020-10-25: qty 1
  Filled 2020-10-25 (×2): qty 2

## 2020-10-25 MED ORDER — GADOBUTROL 1 MMOL/ML IV SOLN
8.0000 mL | Freq: Once | INTRAVENOUS | Status: AC | PRN
  Administered 2020-10-25: 8 mL via INTRAVENOUS

## 2020-10-25 MED ORDER — SODIUM CHLORIDE 0.9 % IV SOLN
100.0000 mg | Freq: Every day | INTRAVENOUS | Status: AC
  Administered 2020-10-26 – 2020-10-29 (×4): 100 mg via INTRAVENOUS
  Filled 2020-10-25 (×4): qty 100

## 2020-10-25 MED ORDER — POTASSIUM CHLORIDE CRYS ER 20 MEQ PO TBCR
40.0000 meq | EXTENDED_RELEASE_TABLET | Freq: Once | ORAL | Status: AC
  Administered 2020-10-25: 40 meq via ORAL
  Filled 2020-10-25: qty 2

## 2020-10-25 MED ORDER — NYSTATIN 100000 UNIT/ML MT SUSP
5.0000 mL | Freq: Four times a day (QID) | OROMUCOSAL | Status: DC
  Filled 2020-10-25 (×3): qty 5

## 2020-10-25 MED ORDER — POLYETHYLENE GLYCOL 3350 17 G PO PACK
17.0000 g | PACK | Freq: Two times a day (BID) | ORAL | Status: DC
  Administered 2020-10-26 – 2020-10-28 (×4): 17 g via ORAL
  Filled 2020-10-25 (×15): qty 1

## 2020-10-25 MED ORDER — FENTANYL CITRATE (PF) 100 MCG/2ML IJ SOLN
100.0000 ug | Freq: Once | INTRAMUSCULAR | Status: AC
  Administered 2020-10-25: 100 ug via INTRAVENOUS
  Filled 2020-10-25: qty 2

## 2020-10-25 MED ORDER — HYDROCOD POLST-CPM POLST ER 10-8 MG/5ML PO SUER: 5.0000 mL | Freq: Two times a day (BID) | ORAL | Status: DC | PRN

## 2020-10-25 MED ORDER — ENSURE ENLIVE PO LIQD
237.0000 mL | Freq: Four times a day (QID) | ORAL | Status: DC
  Administered 2020-10-25 – 2020-11-04 (×32): 237 mL via ORAL

## 2020-10-25 MED ORDER — MAGNESIUM CHLORIDE 64 MG PO TBEC
1.0000 | DELAYED_RELEASE_TABLET | Freq: Every day | ORAL | Status: DC
  Administered 2020-10-26 – 2020-11-04 (×9): 64 mg via ORAL
  Filled 2020-10-25 (×11): qty 1

## 2020-10-25 MED ORDER — ACETAMINOPHEN 325 MG PO TABS: 325.0000 mg | ORAL_TABLET | Freq: Four times a day (QID) | ORAL | Status: AC | PRN

## 2020-10-25 MED ORDER — INSULIN ASPART 100 UNIT/ML ~~LOC~~ SOLN
0.0000 [IU] | Freq: Three times a day (TID) | SUBCUTANEOUS | Status: DC
  Administered 2020-10-26 – 2020-10-30 (×6): 1 [IU] via SUBCUTANEOUS
  Filled 2020-10-25 (×5): qty 1

## 2020-10-25 NOTE — ED Notes (Signed)
Ultrasound tech at bedside

## 2020-10-25 NOTE — ED Notes (Signed)
Patient at MRI

## 2020-10-25 NOTE — ED Triage Notes (Signed)
Pt comes into the ED via EMS from home with RLE pain, currently going through chemotherapy.  98.7 125/64 97%4L [place] home continuous [id number]

## 2020-10-25 NOTE — ED Notes (Signed)
Critical Lactic 4.9. Paduchowski MD notified. MD to follow up.

## 2020-10-25 NOTE — ED Provider Notes (Signed)
[facility] Emergency Department Provider Note  Time seen: 12:03 PM  I have reviewed the triage vital signs and the nursing notes.   HISTORY  Chief Complaint Leg Pain   HPI Jessica Willis is a 55 y.o. female with a past medical history of anemia, invasive right breast cancer now on chronic O2, chemotherapy presents to the emergency department for right leg paresthesias and discomfort.  According to the patient on Friday after coming home from the hospital she fell onto her right side.  Patient states the following day she noticed tingling sensation and discomfort down her right leg.  States it has been slightly more weak as well.  Denies any headache any other weakness or numbness.  Denies any slurred speech or difficulty thinking.  Upon arrival patient found to be quite tachycardic around 145.  Patient states her heart rate is always up.  Record review shows patient's heart rate upon discharge was slightly over 100 bpm 10/21/2020.  Patient denies any fever cough congestion vomiting or diarrhea.  Past Medical History:  Diagnosis Date  . Anemia   . Family history of breast cancer   . Patient denies medical problems     Patient Active Problem List   Diagnosis Date Noted  . Chemotherapy-induced nausea   . Poor appetite   . Pneumonia due to infectious organism 10/07/2020  . QT prolongation 09/22/2020  . Pneumonia due to Streptococcus pyogenes ([place]) 08/22/2020  . Genetic testing 07/27/2020  . Pancreatic lesion 07/22/2020  . Swelling of lower leg 07/13/2020  . Encounter for antineoplastic chemotherapy 07/04/2020  . Complication of chemotherapy 07/04/2020  . Family history of breast cancer   . Port-A-Cath in place   . Bone metastasis ([place])   . Hypocalcemia   . Folate deficiency   . Gastroesophageal reflux disease without esophagitis   . Weakness of right arm   . Palliative care encounter   . Iron deficiency anemia   . Anemia, chronic disease   .  Tachycardia   . Metastatic breast cancer ([place])   . Malignant pleural effusion   . Chest tube in place   . Goals of care, counseling/discussion   . Hemothorax on right 06/16/2020  . Acute respiratory failure with hypoxia ([place])   . Breast mass, right 06/15/2020  . Mastitis 06/15/2020  . Pleural effusion 06/15/2020  . Intestinal obstruction ([place])   . Leukocytosis   . Hypokalemia 06/24/2015  . Sepsis ([place]) 06/23/2015  . CAP (community acquired pneumonia) 06/23/2015  . Partial small bowel obstruction ([place]) 06/23/2015    Past Surgical History:  Procedure Laterality Date  . BREAST BIOPSY  06/28/2020   Procedure: BREAST BIOPSY;  Surgeon: Benjamine Sprague, DO;  Location: [facility] ORS;  Service: General;;  . PORTACATH PLACEMENT N/A 06/28/2020   Procedure: INSERTION PORT-A-CATH;  Surgeon: Benjamine Sprague, DO;  Location: [facility] ORS;  Service: General;  Laterality: N/A;  . TUBAL LIGATION    . VIDEO ASSISTED THORACOSCOPY (VATS)/THOROCOTOMY Right 06/23/2020   Procedure: ATTEMPTED VIDEO ASSISTED THORACOSCOPY (VATS);  Surgeon: Nestor Lewandowsky, MD;  Location: [facility] ORS;  Service: General;  Laterality: Right;    Prior to Admission medications   Medication Sig Start Date End Date Taking? Authorizing Provider  albuterol (PROVENTIL) (2.5 MG/3ML) 0.083% nebulizer solution Take 3 mLs (2.5 mg total) by nebulization every 6 (six) hours as needed for wheezing or shortness of breath. 10/21/20   Lorella Nimrod, MD  albuterol (VENTOLIN HFA) 108 (90 Base) MCG/ACT inhaler Inhale 2 puffs into the lungs every 4 (  four) hours as needed for wheezing or shortness of breath. 10/21/20   Lorella Nimrod, MD  calcium carbonate (CALCIUM 600) 600 MG TABS tablet Take 2 tablets (1,200 mg total) by mouth daily with breakfast. Patient taking differently: Take 1,200 mg by mouth daily with breakfast. 3 tab QD 09/01/20   Earlie Server, MD  dronabinol (MARINOL) 5 MG capsule Take 1 capsule (5 mg total) by mouth 2 (two) times daily before lunch and supper.  10/21/20   Lorella Nimrod, MD  feeding supplement (ENSURE ENLIVE / ENSURE PLUS) LIQD Take 237 mLs by mouth 4 (four) times daily. 10/21/20   Lorella Nimrod, MD  FEROSUL 325 (65 Fe) MG tablet TAKE ONE (1) TABLET BY MOUTH TWO TIMES PER DAY WITH A MEAL 08/31/20   Earlie Server, MD  lidocaine-prilocaine (EMLA) cream Apply 1 application topically as needed. Place a small amount of cream to port site prior to each chemo treatment, 1-2 hours before treatment. 07/04/20   Earlie Server, MD  magic mouthwash w/lidocaine SOLN Take 15 mLs by mouth 3 (three) times daily. 10/21/20   Lorella Nimrod, MD  magnesium chloride (SLOW-MAG) 64 MG TBEC SR tablet Take 1 tablet (64 mg total) by mouth daily. 09/22/20   Earlie Server, MD  metoprolol tartrate (LOPRESSOR) 50 MG tablet Take 1 tablet (50 mg total) by mouth 2 (two) times daily. 10/06/20 01/04/21  Kate Sable, MD  Multiple Vitamin (MULTIVITAMIN WITH MINERALS) TABS tablet Take 1 tablet by mouth daily. 10/21/20   Lorella Nimrod, MD  polyethylene glycol (MIRALAX / GLYCOLAX) 17 g packet Take 17 g by mouth 2 (two) times daily. 10/21/20   Lorella Nimrod, MD  potassium chloride SA (KLOR-CON) 20 MEQ tablet Take 1 tablet (20 mEq total) by mouth daily. 10/21/20   Lorella Nimrod, MD  predniSONE (DELTASONE) 5 MG tablet Take 8 tablets (40 mg total) by mouth daily with breakfast. Decrease your dose by 5 mg every other day. 10/21/20   Lorella Nimrod, MD  sodium chloride (OCEAN) 0.65 % SOLN nasal spray Place 1 spray into both nostrils as needed for congestion. 10/21/20   Lorella Nimrod, MD    No Known Allergies  Family History  Problem Relation Age of Onset  . Diabetes Other   . Hypertension Other   . Diabetes Maternal Aunt   . Breast cancer Cousin        dx 110s  . Breast cancer Cousin        dx 28s    Social History Social History   Tobacco Use  . Smoking status: Never Smoker  . Smokeless tobacco: Never Used  Vaping Use  . Vaping Use: Never used  Substance Use Topics  . Alcohol use: Not  Currently    Alcohol/week: 1.0 standard drink    Types: 1 Cans of beer per week  . Drug use: No    Review of Systems Constitutional: Negative for fever. Cardiovascular: Negative for chest pain. Respiratory: Chronic shortness of breath. Gastrointestinal: Negative for abdominal pain, vomiting and diarrhea. Genitourinary: Negative for urinary compaints Musculoskeletal: Tingling of the right lower extremity.  Some weakness and discomfort. Skin: Invasive right breast cancer Neurological: Negative for headache.  Paresthesias the right lower extremity. All other ROS negative  ____________________________________________   PHYSICAL EXAM:  VITAL SIGNS: ED Triage Vitals  Enc Vitals Group     BP 10/25/20 1127 120/73     Pulse Rate 10/25/20 1127 (!) 145     Resp 10/25/20 1127 19     Temp 10/25/20 1127 97.6  F (36.4 C)     Temp Source 10/25/20 1127 Oral     SpO2 10/25/20 1127 98 %     Weight 10/25/20 1134 178 lb 9.2 oz (81 kg)     Height 10/25/20 1134 5\' 10"  (1.778 m)     Head Circumference --      Peak Flow --      Pain Score 10/25/20 1134 5     Pain Loc --      Pain Edu? --      Excl. in [place]? --     Constitutional: Alert and oriented. Well appearing and in no distress. Eyes: Normal exam ENT      Head: Normocephalic and atraumatic.      Mouth/Throat: Mucous membranes are moist. Cardiovascular: Regular rhythm rate around 150 bpm.  No obvious murmur. Respiratory: Diminished breath sounds to the right chest.  Regular respiratory rate. Gastrointestinal: Soft and nontender. No distention.   Musculoskeletal: Nontender with normal range of motion in all extremities.  Neurologic:  Normal speech and language.  Patient has slightly diminished strength in the right lower extremity 4/5 versus 5/5 in the left lower extremity.  States subjective decreased sensation in the right extremity with tingling sensation.  Upper extremities are equal bilaterally with 5/5 motor.  No obvious cranial  nerve deficits. Skin: Invasive/necrotic right breast with drainage. Psychiatric: Mood and affect are normal.   ____________________________________________    EKG  EKG viewed and interpreted by myself shows sinus tachycardia 146 bpm with a narrow QRS, normal axis, normal intervals, nonspecific ST changes.  ____________________________________________    RADIOLOGY  Hip and pelvis x-ray shows scattered sclerotic lesions consistent with metastatic disease but no acute abnormality. Lumbar x-ray shows no concerning findings.  ____________________________________________   INITIAL IMPRESSION / ASSESSMENT AND PLAN / ED COURSE  Pertinent labs & imaging results that were available during my care of the patient were reviewed by me and considered in my medical decision making (see chart for details).   Patient presents emergency department after a fall Friday following the next day experienced paresthesias and discomfort to the right lower extremity.  Some weakness to the right lower extremity on examination.  Differential would include sciatica, bony injury, metastatic disease/brain lesion, spinal lesion.  Overall patient does appear well pulse rate is high but the patient states this is fairly typical for her and she took her heart medication around 10:30 AM this morning.  However given the patient's history we will check labs, IV hydrate treat pain.  We will obtain images of the right hip pelvis and lumbar spine.  We will continue to closely monitor.  Patient's labs have resulted showing a significant lactic acidosis of 4.9.  No white blood cell count, no fever, highly suspect significant dehydration.  Patient states she has not been eating or drinking much.  Heart rate is quite elevated in the 140s receiving IV fluids we will dose lactated Ringer's given the patient's elevated sodium and lower potassium.  We will also replete potassium with oral potassium.  Given the patient's paresthesias  numbness/tingling to the right lower extremity we will order an MRI to evaluate for possible metastatic spread.  Given the patient's significant dehydration with lactic acidosis, hypernatremia and hypokalemia we will admit to the hospital service for further work-up treatment.  Jessica Willis was evaluated in Emergency Department on 10/25/2020 for the symptoms described in the history of present illness. She was evaluated in the context of the global COVID-19 pandemic, which necessitated consideration that  the patient might be at risk for infection with the SARS-CoV-2 virus that causes COVID-19. Institutional protocols and algorithms that pertain to the evaluation of patients at risk for COVID-19 are in a state of rapid change based on information released by regulatory bodies including the CDC and federal and state organizations. These policies and algorithms were followed during the patient's care in the ED.  ____________________________________________   FINAL CLINICAL IMPRESSION(S) / ED DIAGNOSES  Paresthesias Dehydration Hypokalemia Hypernatremia   Harvest Dark, MD 10/25/20 1501

## 2020-10-25 NOTE — ED Notes (Signed)
Diet states clear liquid. Dietary would not send ensure. Amy Cox DO notified to change diet order.

## 2020-10-25 NOTE — ED Triage Notes (Addendum)
Pt via EMS from home. Pt c/o R leg tingling and numbness. Pt had a mechanical fall on Friday and said that her R leg has been tingling and numb since last night. Pt is a breast cancer pt and is on chemotherapy. Denies head injury during the fall. On arrival, pt HR is 145. When doing an EKG, pt wound from her mastectomy has not been changed since Friday.

## 2020-10-25 NOTE — ED Notes (Signed)
Contacted lab to draw blood cultures.

## 2020-10-25 NOTE — ED Notes (Signed)
Critical fibrinogen level. Amy Cox DO notified.

## 2020-10-25 NOTE — H&P (Addendum)
History and Physical   Jessica Willis BMW:[id number] DOB: 1966-03-17 DOA: 10/25/2020  PCP: [facility]  Outpatient Specialists: Dr. Tasia Catchings, medical oncology Patient coming from: home  I have personally briefly reviewed patient's old medical records in [place].  Chief Concern: weakness  HPI: Jessica Willis is a 55 y.o. 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  She endorses weakness and frequent falling since discharge from the hospital.  She endorses poor p.o. intake since discharge from the hospital and she is not able to eat due to generalized mouth pain.  She denies fever, vomiting, chest pain, shortness of breath, cough, dysuria, hematuria, diarrhea.  She endorses generalized weakness and poor p.o. intake as her main concerns. She also endorses right lower extremity leg pain.   Social history: lives with friend.  She reports that her daughter is her healthcare power of attorney.  She does not have a history of tobacco use, she does not drink alcohol and prior to that infrequently consumed EtOH, no rec recreational drug use.   Vaccination: Patient is not vaccinated for COVID-19 and does not want to be vaccinated  Allergies: No known life-threatening drug allergies per patient      ROS: Constitutional: no weight change, no fever ENT/Mouth: no sore throat, no rhinorrhea Eyes: no eye pain, no vision changes Cardiovascular: no chest pain, no dyspnea,  no edema, no palpitations Respiratory: no cough, no sputum, no wheezing Gastrointestinal: + nausea, no vomiting, no diarrhea, no constipation Genitourinary: no urinary incontinence, no dysuria, no hematuria Musculoskeletal: no arthralgias, no myalgias Skin: no skin lesions, no  pruritus, Neuro: + weakness, no loss of consciousness, no syncope Psych: no anxiety, no depression, + decrease appetite Heme/Lymph: no bruising, no bleeding  ED Course: Discussed with ED provider, patient requiring hospitalization due to failure to thrive.  Vitals in the emergency department show temperature of 97.6, respiration rate of 17, heart rate of 117, blood pressure 122/74, patient is satting at 98% on 4 L nasal cannula initially however at bedside she did take off her nasal cannula and she is on room air and maintaining oxygenation.  Labs in the emergency department was remarkable for serum sodium of 151, potassium of 3.2, chloride 105, bicarb 33, BUN 44, serum creatinine of 0.62, glucose of 193, WBC of 5, hemoglobin 9.3, platelets 195, INR 1.8, PT 193.  AST elevated at 86, albumin 2.9, lactic acid elevated at 4.9.    UA is ordered and pending collection.  Chest x-ray ordered by ED provider was read as aeration in both lungs appear improved compared to the most recent study in this patient with extensive metastatic breast cancer.  No new abnormality.  Unilateral hip with and without pelvis on the right side: Read as no acute abnormality, scattered sclerotic lesions consistent with known metastatic breast cancer.  Lumbar spine x-ray read as no fracture or spondylolisthesis.  No evidence of arthropathy.  Assessment/Plan  Principal Problem:   Failure to thrive in adult Active Problems:   Breast mass, right   Metastatic breast cancer (HCC)   Anemia, chronic disease   Iron deficiency anemia   Bone metastasis (HCC)   Port-A-Cath in place   Chemotherapy-induced nausea   Poor appetite   Weakness   Dehydration   COVID-19 virus infection   Pneumonia due to COVID-19 virus   #  Malnutrition and failure to thrive-dietary has been consulted Poor appetite -Status post 2 L lactated ringer bolus and fentanyl 100 mcg IV once per ED provider -Marinol resumed -Patient is a candidate for  comfort care however at this time she is full code and wants to be full code therefore I do not believe she is ready -I consulted spiritual care -Patient is appropriate for palliative care consult however as I discussed with her CODE STATUS she remains to be full code therefore I do not feel like she is emotionally and psychologically ready to see palliative care at this time -PT and OT consult -A.m. team to consult palliative care as appropriate  # Metastatic triple negative breast cancer with high tumor burden progressed on first-line palliative chemotherapy with Taxol - Status post chemotherapy initiation on on 10/14/2020 -Medical oncology was consulted on previous hospitalization and notes that there are other treatments available however immunotherapy is less likely beneficial given her CPS is less than 1 -Outpatient follow-up with oncology -Pain control morphine 4 mg every 4 hours as needed for severe pain, oxycodone 7.5 mg every 8 hours as needed for moderate pain  # Right lower extremity leg pain -ultrasound of the right lower extremity to assess for DVT -Checking CK level  # Increased lactic acid, sinus tachycardia, increased respiration rate # Sepsis secondary to covid pna # Acute on chronic hypoxic respiratory failure # Covid pna -Remdesivir per pharmacy, Decadron 6 mg IV initiated -Daily labs: CMP, CBC, D-dimer, CRP -Patient did not have cough, fever, chills, or worsening shortness of breath, however she is high risk for decompensation  -Continue with dietary supplementation as above -Given that she has COVID-19 infection, no IV fluid maintenance fluid continued at this time, encourage p.o. intake  # Hypokalemia-suspect secondary to poor p.o. intake, replaced as appropriate -Status post 40 mEq of potassium chloride p.o. -Magnesium on admission is 2.9 -BMP in the a.m.  # Scattered sclerotic lesions of the bone consistent with metastatic breast cancer to the bones  # Nausea  and vomiting secondary to chemotherapy and metastatic disease -Scopolamine patch -Zofran 4 mg every 8 hours as needed for breakthrough nausea and vomiting  # Goals of care-patient's breast cancer is not curable, refractory to first-line chemotherapy, aggressive with metastasis -Poor prognosis, patient appropriate for comfort care when she is ready -High risk for readmission  # History of QT prolongation-resolved on EKG on admission, QTC is 411  # Chronic sinus tachycardia secondary to breast cancer with metastasis, not curable -beta-blockade with metoprolol tartrate 50 mg twice daily resumed  # Iron deficiency anemia-at baseline # Covid test is positive  Chart reviewed.   Hospitalization on 10/07/2020 to 10/21/2020: Admitted for acute hypoxic respiratory failure, initially sepsis was considered secondary to pneumonia complicated by metastatic breast cancer.  Pulmonology, infectious disease, oncology, palliative care was consulted.  Patient was treated and completed with antibiotics with Augmentin, prednisone taper per pulmonology recommendation, Lasix, albumin, and discharged home.  Plan was for her to continue aggressive chemotherapy.  She was discharged home with prednisone 40 mg daily taper down by 5 mg every other day.  DVT prophylaxis: Enoxaparin 40 mg subcutaneous every 24 hours Code Status: Full code Diet: Full liquid, advance to soft regular Family Communication: Attempted to call daughter at [phone], no pickup Disposition Plan: Poor prognosis Consults called: None at this time Admission status: Progressive cardiac, observation, with telemetry  Past Medical History:  Diagnosis Date  . Anemia   . Family history of breast cancer   .  Patient denies medical problems    Past Surgical History:  Procedure Laterality Date  . BREAST BIOPSY  06/28/2020   Procedure: BREAST BIOPSY;  Surgeon: Benjamine Sprague, DO;  Location: [facility] ORS;  Service: General;;  . PORTACATH PLACEMENT N/A  06/28/2020   Procedure: INSERTION PORT-A-CATH;  Surgeon: Benjamine Sprague, DO;  Location: [facility] ORS;  Service: General;  Laterality: N/A;  . TUBAL LIGATION    . VIDEO ASSISTED THORACOSCOPY (VATS)/THOROCOTOMY Right 06/23/2020   Procedure: ATTEMPTED VIDEO ASSISTED THORACOSCOPY (VATS);  Surgeon: Nestor Lewandowsky, MD;  Location: [facility] ORS;  Service: General;  Laterality: Right;   Social History:  reports that she has never smoked. She has never used smokeless tobacco. She reports previous alcohol use of about 1.0 standard drink of alcohol per week. She reports that she does not use drugs.  No Known Allergies Family History  Problem Relation Age of Onset  . Diabetes Other   . Hypertension Other   . Diabetes Maternal Aunt   . Breast cancer Cousin        dx 84s  . Breast cancer Cousin        dx 51s   Family history: Family history reviewed and not pertinent  Prior to Admission medications   Medication Sig Start Date End Date Taking? Authorizing Provider  albuterol (PROVENTIL) (2.5 MG/3ML) 0.083% nebulizer solution Take 3 mLs (2.5 mg total) by nebulization every 6 (six) hours as needed for wheezing or shortness of breath. 10/21/20   Lorella Nimrod, MD  albuterol (VENTOLIN HFA) 108 (90 Base) MCG/ACT inhaler Inhale 2 puffs into the lungs every 4 (four) hours as needed for wheezing or shortness of breath. 10/21/20   Lorella Nimrod, MD  calcium carbonate (CALCIUM 600) 600 MG TABS tablet Take 2 tablets (1,200 mg total) by mouth daily with breakfast. Patient taking differently: Take 1,200 mg by mouth daily with breakfast. 3 tab QD 09/01/20   Earlie Server, MD  dronabinol (MARINOL) 5 MG capsule Take 1 capsule (5 mg total) by mouth 2 (two) times daily before lunch and supper. 10/21/20   Lorella Nimrod, MD  feeding supplement (ENSURE ENLIVE / ENSURE PLUS) LIQD Take 237 mLs by mouth 4 (four) times daily. 10/21/20   Lorella Nimrod, MD  FEROSUL 325 (65 Fe) MG tablet TAKE ONE (1) TABLET BY MOUTH TWO TIMES PER DAY WITH A MEAL  08/31/20   Earlie Server, MD  lidocaine-prilocaine (EMLA) cream Apply 1 application topically as needed. Place a small amount of cream to port site prior to each chemo treatment, 1-2 hours before treatment. 07/04/20   Earlie Server, MD  magic mouthwash w/lidocaine SOLN Take 15 mLs by mouth 3 (three) times daily. 10/21/20   Lorella Nimrod, MD  magnesium chloride (SLOW-MAG) 64 MG TBEC SR tablet Take 1 tablet (64 mg total) by mouth daily. 09/22/20   Earlie Server, MD  metoprolol tartrate (LOPRESSOR) 50 MG tablet Take 1 tablet (50 mg total) by mouth 2 (two) times daily. 10/06/20 01/04/21  Kate Sable, MD  Multiple Vitamin (MULTIVITAMIN WITH MINERALS) TABS tablet Take 1 tablet by mouth daily. 10/21/20   Lorella Nimrod, MD  polyethylene glycol (MIRALAX / GLYCOLAX) 17 g packet Take 17 g by mouth 2 (two) times daily. 10/21/20   Lorella Nimrod, MD  potassium chloride SA (KLOR-CON) 20 MEQ tablet Take 1 tablet (20 mEq total) by mouth daily. 10/21/20   Lorella Nimrod, MD  predniSONE (DELTASONE) 5 MG tablet Take 8 tablets (40 mg total) by mouth daily with breakfast. Decrease your dose by 5  mg every other day. 10/21/20   Lorella Nimrod, MD  sodium chloride (OCEAN) 0.65 % SOLN nasal spray Place 1 spray into both nostrils as needed for congestion. 10/21/20   Lorella Nimrod, MD   Physical Exam: Vitals:   10/25/20 1405 10/25/20 1415 10/25/20 1430 10/25/20 1530  BP: 109/72  122/74 132/74  Pulse: (!) 111 (!) 111 (!) 114 (!) 145  Resp: (!) 7 (!) 8 13 17   Temp:      TempSrc:      SpO2: 100% 100% 100% 100%  Weight:      Height:       Constitutional: appears older than chronological age, frail, cachectic, NAD, calm, comfortable Eyes: PERRL, lids and conjunctivae normal HENMT: Bilateral temporal wasting, bilateral orbital wasting, mucous membranes are dry. Posterior pharynx clear of any exudate or lesions. Age-appropriate dentition. Hearing appropriate Neck: normal, supple, no masses, no thyromegaly Respiratory: clear to auscultation  bilaterally, no wheezing, no crackles. Normal respiratory effort. No accessory muscle use.  Cardiovascular: Regular rate and rhythm, no murmurs / rubs / gallops. No extremity edema. 2+ pedal pulses. No carotid bruits.  Left-sided Port-A-Cath in place Abdomen: no tenderness, no masses palpated, no hepatosplenomegaly. Bowel sounds positive.  Musculoskeletal: no clubbing / cyanosis. No joint deformity upper and lower extremities. Good ROM, no contractures, no atrophy. Normal muscle tone.  Skin: Fungating left breast mass, no rashes, lesions, ulcers. No induration Neurologic: Sensation intact. Strength 5/5 in all 4.  Psychiatric: Normal judgment and insight. Alert and oriented x 3. Normal mood.   EKG: independently reviewed, showing sinus tachycardia with rate of 146, QTc 411  Chest x-ray on Admission: I personally reviewed and I agree with radiologist reading as below.  DG Lumbar Spine Complete  Result Date: 10/25/2020 CLINICAL DATA:  Pain following fall EXAM: LUMBAR SPINE - COMPLETE 4+ VIEW COMPARISON:  None. FINDINGS: Frontal, lateral, spot lumbosacral lateral, and bilateral oblique views were obtained. There are 5 non-rib-bearing lumbar type vertebral bodies. There is no fracture or spondylolisthesis. The disc spaces appear normal. There is no appreciable facet arthropathy. IMPRESSION: No fracture or spondylolisthesis.  No evident arthropathy. Electronically Signed   By: Lowella Grip III M.D.   On: 10/25/2020 14:08   DG Chest Port 1 View  Result Date: 10/25/2020 CLINICAL DATA:  Chest pain. EXAM: PORTABLE CHEST 1 VIEW COMPARISON:  Single-view of the chest 10/15/2020 and 10/13/2020. CT chest 10/07/2020. FINDINGS: Right pleural effusion and opacities projecting over the right chest consistent with known chest wall and intrathoracic metastatic breast carcinoma again seen. Scattered opacities in the left chest are also again seen. Aeration has improved bilaterally since the most recent exam. Small  right pleural effusion is noted. The left lung is clear. Heart size is normal. IMPRESSION: Aeration in both lungs appears improved compared to the most recent study in this patient with extensive metastatic breast cancer. No new abnormality. Electronically Signed   By: Inge Rise M.D.   On: 10/25/2020 12:19   DG HIP UNILAT W OR W/O PELVIS 2-3 VIEWS RIGHT  Result Date: 10/25/2020 CLINICAL DATA:  History of metastatic breast cancer. Patient status post fall. Initial encounter. EXAM: DG HIP (WITH OR WITHOUT PELVIS) 2-3V RIGHT COMPARISON:  PET CT scan 07/19/2020. FINDINGS: No acute bony or joint abnormality is identified. Scattered sclerotic lesions are present in both hips and the pelvis consistent with known metastatic breast cancer. Soft tissues are negative. IMPRESSION: No acute abnormality. Scattered sclerotic lesions consistent with known metastatic breast cancer. Electronically Signed   By:  Inge Rise M.D.   On: 10/25/2020 14:09   Labs on Admission: I have personally reviewed following labs  CBC: Recent Labs  Lab 10/20/20 0615 10/21/20 0613 10/25/20 1130  WBC 18.8* 12.6* 5.0  NEUTROABS  --  11.0* 3.8  HGB 8.7* 8.7* 9.3*  HCT 29.7* 29.9* 33.2*  MCV 86.6 85.4 87.8  PLT 319 263 981   Basic Metabolic Panel: Recent Labs  Lab 10/19/20 0455 10/20/20 0615 10/21/20 0613 10/25/20 1130  NA 142 144 143 151*  K 4.3 3.9 3.4* 3.2*  CL 98 99 100 105  CO2 34* 34* 34* 33*  GLUCOSE 98 96 82 193*  BUN 24* 26* 25* 44*  CREATININE 0.49 0.37* 0.35* 0.62  CALCIUM 8.4* 8.4* 8.1* 8.7*  MG  --  2.3  --  2.9*  PHOS  --  3.4  --  2.3*   GFR: Estimated Creatinine Clearance: 84.9 mL/min (by C-G formula based on SCr of 0.62 mg/dL).  Liver Function Tests: Recent Labs  Lab 10/25/20 1130  AST 86*  ALT 24  ALKPHOS 300*  BILITOT 0.8  PROT 6.8  ALBUMIN 2.9*   Coagulation Profile: Recent Labs  Lab 10/25/20 1130  INR 1.8*   Urine analysis:    Component Value Date/Time    COLORURINE AMBER (A) 10/20/2020 1054   APPEARANCEUR TURBID (A) 10/20/2020 1054   LABSPEC 1.026 10/20/2020 1054   PHURINE 5.0 10/20/2020 1054   GLUCOSEU NEGATIVE 10/20/2020 1054   HGBUR NEGATIVE 10/20/2020 1054   [place] 10/20/2020 1054   KETONESUR NEGATIVE 10/20/2020 1054   PROTEINUR NEGATIVE 10/20/2020 1054   NITRITE NEGATIVE 10/20/2020 1054   LEUKOCYTESUR NEGATIVE 10/20/2020 1054   CRITICAL CARE Performed by: Briant Cedar Haydn Cush  Total critical care time: 35 minutes  Critical care time was exclusive of separately billable procedures and treating other patients.  Critical care was necessary to treat or prevent imminent or life-threatening deterioration. Sepsis secondary to covid 19   Critical care was time spent personally by me on the following activities: development of treatment plan with patient and/or surrogate as well as nursing, discussions with consultants, evaluation of patient's response to treatment, examination of patient, obtaining history from patient or surrogate, ordering and performing treatments and interventions, ordering and review of laboratory studies, ordering and review of radiographic studies, pulse oximetry and re-evaluation of patient's condition.  Arieon Scalzo N Trigg Delarocha D.O. [facility]  If 7PM-7AM, please contact overnight-coverage provider If 7AM-7PM, please contact day coverage provider [email or web address]  10/25/2020, 4:55 PM

## 2020-10-26 ENCOUNTER — Encounter: Payer: Self-pay | Admitting: Internal Medicine

## 2020-10-26 DIAGNOSIS — C7951 Secondary malignant neoplasm of bone: Secondary | ICD-10-CM | POA: Diagnosis present

## 2020-10-26 DIAGNOSIS — J942 Hemothorax: Secondary | ICD-10-CM | POA: Diagnosis present

## 2020-10-26 DIAGNOSIS — L89322 Pressure ulcer of left buttock, stage 2: Secondary | ICD-10-CM | POA: Diagnosis present

## 2020-10-26 DIAGNOSIS — C761 Malignant neoplasm of thorax: Secondary | ICD-10-CM | POA: Diagnosis present

## 2020-10-26 DIAGNOSIS — R630 Anorexia: Secondary | ICD-10-CM | POA: Diagnosis not present

## 2020-10-26 DIAGNOSIS — J9611 Chronic respiratory failure with hypoxia: Secondary | ICD-10-CM | POA: Diagnosis present

## 2020-10-26 DIAGNOSIS — J910 Malignant pleural effusion: Secondary | ICD-10-CM | POA: Diagnosis present

## 2020-10-26 DIAGNOSIS — E876 Hypokalemia: Secondary | ICD-10-CM | POA: Diagnosis present

## 2020-10-26 DIAGNOSIS — E870 Hyperosmolality and hypernatremia: Secondary | ICD-10-CM | POA: Diagnosis present

## 2020-10-26 DIAGNOSIS — E875 Hyperkalemia: Secondary | ICD-10-CM | POA: Diagnosis not present

## 2020-10-26 DIAGNOSIS — Y92009 Unspecified place in unspecified non-institutional (private) residence as the place of occurrence of the external cause: Secondary | ICD-10-CM | POA: Diagnosis not present

## 2020-10-26 DIAGNOSIS — D638 Anemia in other chronic diseases classified elsewhere: Secondary | ICD-10-CM | POA: Diagnosis present

## 2020-10-26 DIAGNOSIS — T380X5A Adverse effect of glucocorticoids and synthetic analogues, initial encounter: Secondary | ICD-10-CM | POA: Diagnosis not present

## 2020-10-26 DIAGNOSIS — E43 Unspecified severe protein-calorie malnutrition: Secondary | ICD-10-CM

## 2020-10-26 DIAGNOSIS — Z7901 Long term (current) use of anticoagulants: Secondary | ICD-10-CM | POA: Diagnosis not present

## 2020-10-26 DIAGNOSIS — J9601 Acute respiratory failure with hypoxia: Secondary | ICD-10-CM | POA: Diagnosis not present

## 2020-10-26 DIAGNOSIS — Z171 Estrogen receptor negative status [ER-]: Secondary | ICD-10-CM | POA: Diagnosis not present

## 2020-10-26 DIAGNOSIS — U071 COVID-19: Secondary | ICD-10-CM | POA: Diagnosis present

## 2020-10-26 DIAGNOSIS — C50919 Malignant neoplasm of unspecified site of unspecified female breast: Secondary | ICD-10-CM | POA: Diagnosis not present

## 2020-10-26 DIAGNOSIS — I82411 Acute embolism and thrombosis of right femoral vein: Secondary | ICD-10-CM

## 2020-10-26 DIAGNOSIS — L89312 Pressure ulcer of right buttock, stage 2: Secondary | ICD-10-CM | POA: Diagnosis present

## 2020-10-26 DIAGNOSIS — Z803 Family history of malignant neoplasm of breast: Secondary | ICD-10-CM | POA: Diagnosis not present

## 2020-10-26 DIAGNOSIS — E860 Dehydration: Secondary | ICD-10-CM | POA: Diagnosis present

## 2020-10-26 DIAGNOSIS — W19XXXA Unspecified fall, initial encounter: Secondary | ICD-10-CM | POA: Diagnosis present

## 2020-10-26 DIAGNOSIS — Z66 Do not resuscitate: Secondary | ICD-10-CM | POA: Diagnosis not present

## 2020-10-26 DIAGNOSIS — C7800 Secondary malignant neoplasm of unspecified lung: Secondary | ICD-10-CM | POA: Diagnosis present

## 2020-10-26 DIAGNOSIS — D509 Iron deficiency anemia, unspecified: Secondary | ICD-10-CM | POA: Diagnosis present

## 2020-10-26 DIAGNOSIS — Z515 Encounter for palliative care: Secondary | ICD-10-CM | POA: Diagnosis not present

## 2020-10-26 DIAGNOSIS — R627 Adult failure to thrive: Secondary | ICD-10-CM | POA: Diagnosis present

## 2020-10-26 DIAGNOSIS — C50911 Malignant neoplasm of unspecified site of right female breast: Secondary | ICD-10-CM | POA: Diagnosis present

## 2020-10-26 LAB — COMPREHENSIVE METABOLIC PANEL
ALT: 17 U/L (ref 0–44)
AST: 63 U/L — ABNORMAL HIGH (ref 15–41)
Albumin: 2.5 g/dL — ABNORMAL LOW (ref 3.5–5.0)
Alkaline Phosphatase: 272 U/L — ABNORMAL HIGH (ref 38–126)
Anion gap: 11 (ref 5–15)
BUN: 40 mg/dL — ABNORMAL HIGH (ref 6–20)
CO2: 33 mmol/L — ABNORMAL HIGH (ref 22–32)
Calcium: 9 mg/dL (ref 8.9–10.3)
Chloride: 108 mmol/L (ref 98–111)
Creatinine, Ser: 0.47 mg/dL (ref 0.44–1.00)
GFR, Estimated: >60 mL/min (ref >60)
Glucose, Bld: 131 mg/dL — ABNORMAL HIGH (ref 70–99)
Potassium: 4 mmol/L (ref 3.5–5.1)
Sodium: 152 mmol/L — ABNORMAL HIGH (ref 135–145)
Total Bilirubin: 0.7 mg/dL (ref 0.3–1.2)
Total Protein: 5.7 g/dL — ABNORMAL LOW (ref 6.5–8.1)

## 2020-10-26 LAB — CBC WITH DIFFERENTIAL/PLATELET
Abs Immature Granulocytes: 0.06 10*3/uL (ref 0.00–0.07)
Basophils Absolute: 0 10*3/uL (ref 0.0–0.1)
Basophils Relative: 0 %
Eosinophils Absolute: 0 10*3/uL (ref 0.0–0.5)
Eosinophils Relative: 0 %
HCT: 28.6 % — ABNORMAL LOW (ref 36.0–46.0)
Hemoglobin: 7.9 g/dL — ABNORMAL LOW (ref 12.0–15.0)
Immature Granulocytes: 1 %
Lymphocytes Relative: 14 %
Lymphs Abs: 0.6 10*3/uL — ABNORMAL LOW (ref 0.7–4.0)
MCH: 24.6 pg — ABNORMAL LOW (ref 26.0–34.0)
MCHC: 27.6 g/dL — ABNORMAL LOW (ref 30.0–36.0)
MCV: 89.1 fL (ref 80.0–100.0)
Monocytes Absolute: 0.5 10*3/uL (ref 0.1–1.0)
Monocytes Relative: 11 %
Neutro Abs: 3.4 10*3/uL (ref 1.7–7.7)
Neutrophils Relative %: 74 %
Platelets: 165 10*3/uL (ref 150–400)
RBC: 3.21 MIL/uL — ABNORMAL LOW (ref 3.87–5.11)
RDW: 16.9 % — ABNORMAL HIGH (ref 11.5–15.5)
WBC: 4.7 10*3/uL (ref 4.0–10.5)
nRBC: 0.4 % — ABNORMAL HIGH (ref 0.0–0.2)

## 2020-10-26 LAB — GLUCOSE, CAPILLARY
Glucose-Capillary: 132 mg/dL — ABNORMAL HIGH (ref 70–99)
Glucose-Capillary: 126 mg/dL — ABNORMAL HIGH (ref 70–99)
Glucose-Capillary: 121 mg/dL — ABNORMAL HIGH (ref 70–99)
Glucose-Capillary: 120 mg/dL — ABNORMAL HIGH (ref 70–99)

## 2020-10-26 LAB — C-REACTIVE PROTEIN: CRP: 18.1 mg/dL — ABNORMAL HIGH (ref <1.0)

## 2020-10-26 LAB — D-DIMER, QUANTITATIVE: D-Dimer, Quant: >20 ug/mL-FEU — ABNORMAL HIGH (ref 0.00–0.50)

## 2020-10-26 MED ORDER — APIXABAN 5 MG PO TABS
10.0000 mg | ORAL_TABLET | Freq: Two times a day (BID) | ORAL | Status: AC
  Administered 2020-10-26 – 2020-11-01 (×14): 10 mg via ORAL
  Filled 2020-10-26 (×13): qty 2

## 2020-10-26 MED ORDER — POTASSIUM CL IN DEXTROSE 5% 20 MEQ/L IV SOLN: 20.0000 meq | INTRAVENOUS | Status: DC

## 2020-10-26 MED ORDER — CHLORHEXIDINE GLUCONATE CLOTH 2 % EX PADS
6.0000 | MEDICATED_PAD | Freq: Every day | CUTANEOUS | Status: DC
  Administered 2020-10-28 – 2020-11-04 (×7): 6 via TOPICAL

## 2020-10-26 MED ORDER — APIXABAN 5 MG PO TABS
5.0000 mg | ORAL_TABLET | Freq: Two times a day (BID) | ORAL | Status: DC
  Administered 2020-11-02 – 2020-11-04 (×6): 5 mg via ORAL
  Filled 2020-10-26 (×6): qty 1

## 2020-10-26 MED ORDER — POTASSIUM CL IN DEXTROSE 5% 20 MEQ/L IV SOLN
20.0000 meq | INTRAVENOUS | Status: DC
  Administered 2020-10-26 (×2): 20 meq via INTRAVENOUS
  Filled 2020-10-26 (×5): qty 1000

## 2020-10-26 NOTE — Progress Notes (Signed)
OT Cancellation Note  Patient Details Name: Jessica Willis MRN: [id number] DOB: 1966-01-31   Cancelled Treatment:    Reason Eval/Treat Not Completed: Medical issues which prohibited therapy. Chart reviewed.  Pt noted with extensive acute DVT extending from proximal R femoral vein through veins of the calf 3/15. Per chart review, therapeutic dose of anti-coagulation scheduled for 1145 today. Per therapy guidelines for acute DVT, will hold therapy until 5-6 hours after therapeutic dose of anti-coagulation is given and pt is determined to be medically appropriate for therapy participation. Will hold this date and initiate services as appropriate.   Dessie Coma, M.S. OTR/L  10/26/20, 11:09 AM  ascom [phone]

## 2020-10-26 NOTE — Progress Notes (Signed)
Progress Note    Jessica Willis  WUJ:[id number] DOB: 01/06/1966  DOA: 10/25/2020 PCP: [place]      Brief Narrative:    Medical records reviewed and are as summarized below:  Jessica Willis is a 55 y.o. female       Assessment/Plan:   Principal Problem:   Failure to thrive in adult Active Problems:   Breast mass, right   Metastatic breast cancer ([place])   Anemia, chronic disease   Iron deficiency anemia   Bone metastasis ([place])   Port-A-Cath in place   Chemotherapy-induced nausea   Poor appetite   Weakness   Dehydration   COVID-19 virus infection   Protein-calorie malnutrition, severe   Hypernatremia   Acute deep vein thrombosis (DVT) of right femoral vein (H[facility])   Nutrition Problem: Severe Malnutrition Etiology: acute illness (HCAP, COVID 19)  Signs/Symptoms: moderate fat depletion,moderate muscle depletion,severe muscle depletion,percent weight loss Percent weight loss: 12 %   Body mass index is 21.7 kg/m.    Hypernatremia, dehydration: Start IV 5% dextrose with 20 mEq of potassium chloride.  Monitor BMP.  Acute right femoral DVT: Discussed risks, benefits and alternatives to anticoagulation.  Patient opted for anticoagulation with Eliquis.  Start therapeutic dose of Eliquis today.  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  Follow-up with oncologist as an outpatient.  Received a call from radiologist Dr. Ronnald Ramp, today about addendum to MRI brain.  He said metastatic disease to extraocular muscles cannot be ruled out.  Outpatient follow-up with ophthalmologist recommended.  Hypokalemia: Improved.  Continue potassium repletion and monitor levels.  Asymptomatic COVID-19 infection: No pneumonia on chest x-ray.  No evidence of sepsis.  She likely has SIRS from dehydration.  Patient was recently  discharged home on 3 to 4 L/min oxygen via nasal cannula so hypoxia is not new.  No indication for steroids.  Continue IV remdesivir because of high risk for decompensation given underlying immunocompromised state.  Continue oxygen for chronic hypoxic.  Severe protein calorie malnutrition, failure to thrive: Encourage use of dietary supplements and adequate oral intake.  Follow-up with dietitian.  Generalized weakness: PT and OT.  Recent discharge from the hospital on 10/21/2020 after hospitalization for sepsis secondary to pneumonia complicated by acute hypoxic respiratory failure.  Poor prognosis.  She remains a full code and she is not ready for hospice.        Diet Order            Diet full liquid Room service appropriate? Yes; Fluid consistency: Thin  Diet effective now                    Consultants:  None  Procedures:  None    Medications:   . apixaban  10 mg Oral BID   Followed by  . [START ON 11/02/2020] apixaban  5 mg Oral BID  . vitamin C  500 mg Oral Daily  . calcium carbonate  1,250 mg Oral Q breakfast  . dexamethasone (DECADRON) injection  6 mg Intravenous Q24H  . dronabinol  5 mg Oral BID AC  . feeding supplement  237 mL Oral QID  . insulin aspart  0-5 Units Subcutaneous QHS  . insulin aspart  0-9 Units Subcutaneous TID WC  . magic mouthwash w/lidocaine  15 mL Oral TID  . magnesium chloride  1 tablet Oral Daily  . metoprolol tartrate  50  mg Oral BID  . multivitamin with minerals  1 tablet Oral Daily  . polyethylene glycol  17 g Oral BID  . zinc sulfate  220 mg Oral Daily   Continuous Infusions: . dextrose 5 % with KCl 20 mEq / L 20 mEq (10/26/20 1200)  . remdesivir 100 mg in NS 100 mL 100 mg (10/26/20 1032)     Anti-infectives (From admission, onward)   Start     Dose/Rate Route Frequency Ordered Stop   10/26/20 1000  remdesivir 100 mg in sodium chloride 0.9 % 100 mL IVPB       "Followed by" Linked Group Details   100 mg 200 mL/hr over  30 Minutes Intravenous Daily 10/25/20 1648 10/30/20 0959   10/25/20 1800  remdesivir 200 mg in sodium chloride 0.9% 250 mL IVPB       "Followed by" Linked Group Details   200 mg 580 mL/hr over 30 Minutes Intravenous Once 10/25/20 1648 10/25/20 2130             Family Communication/Anticipated D/C date and plan/Code Status   DVT prophylaxis: Place TED hose Start: 10/25/20 1530 apixaban (ELIQUIS) tablet 10 mg  apixaban (ELIQUIS) tablet 5 mg     Code Status: Full Code  Family Communication: None Disposition Plan:    Status is: Inpatient  Remains inpatient appropriate because:IV treatments appropriate due to intensity of illness or inability to take PO and Inpatient level of care appropriate due to severity of illness   Dispo: The patient is from: Home              Anticipated d/c is to: Home              Patient currently is not medically stable to d/c.   Difficult to place patient No           Subjective:   C/o generalized weakness, tingling and swelling in the right lower extremity  Objective:    Vitals:   10/26/20 0400 10/26/20 0800 10/26/20 0805 10/26/20 1200  BP: 121/73 106/86  136/72  Pulse: 100  (!) 101 (!) 108  Resp:  (!) 21 19 18   Temp: 97.9 F (36.6 C) 98 F (36.7 C)  98.2 F (36.8 C)  TempSrc: Oral   Oral  SpO2: 100%   99%  Weight: 68.6 kg     Height:       No data found.  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 EYES: EOMI ENT: MMM CV: RRR PULM: CTA B ABD: soft, ND, NT, +BS CNS: AAO x 3, non focal EXT: Swelling in the right lower extremity        Data Reviewed:   I have personally reviewed following labs and imaging studies:  Labs: Labs show the following:   Basic Metabolic Panel: Recent Labs  Lab 10/20/20 0615 10/21/20 0613  10/25/20 1130 10/26/20 0504  NA 144 143 151* 152*  K 3.9 3.4* 3.2* 4.0  CL 99 100 105 108  CO2 34* 34* 33* 33*  GLUCOSE 96 82 193* 131*  BUN 26* 25* 44* 40*  CREATININE 0.37* 0.35* 0.62 0.47  CALCIUM 8.4* 8.1* 8.7* 9.0  MG 2.3  --  2.9*  --   PHOS 3.4  --  2.3*  --  GFR Estimated Creatinine Clearance: 84.9 mL/min (by C-G formula based on SCr of 0.47 mg/dL). Liver Function Tests: Recent Labs  Lab 10/25/20 1130 10/26/20 0504  AST 86* 63*  ALT 24 17  ALKPHOS 300* 272*  BILITOT 0.8 0.7  PROT 6.8 5.7*  ALBUMIN 2.9* 2.5*   No results for input(s): LIPASE, AMYLASE in the last 168 hours. No results for input(s): AMMONIA in the last 168 hours. Coagulation profile Recent Labs  Lab 10/25/20 1130  INR 1.8*    CBC: Recent Labs  Lab 10/20/20 0615 10/21/20 0613 10/25/20 1130 10/26/20 0504  WBC 18.8* 12.6* 5.0 4.7  NEUTROABS  --  11.0* 3.8 3.4  HGB 8.7* 8.7* 9.3* 7.9*  HCT 29.7* 29.9* 33.2* 28.6*  MCV 86.6 85.4 87.8 89.1  PLT 319 263 195 165   Cardiac Enzymes: Recent Labs  Lab 10/25/20 1915  CKTOTAL 44   BNP (last 3 results) No results for input(s): PROBNP in the last 8760 hours. CBG: Recent Labs  Lab 10/25/20 2009 10/25/20 2218 10/26/20 0846 10/26/20 1249  GLUCAP 118* 165* 132* 126*   D-Dimer: Recent Labs    10/25/20 1915 10/26/20 0504  DDIMER >20.00* >20.00*   Hgb A1c: No results for input(s): HGBA1C in the last 72 hours. Lipid Profile: No results for input(s): CHOL, HDL, LDLCALC, TRIG, CHOLHDL, LDLDIRECT in the last 72 hours. Thyroid function studies: No results for input(s): TSH, T4TOTAL, T3FREE, THYROIDAB in the last 72 hours.  Invalid input(s): FREET3 Anemia work up: No results for input(s): VITAMINB12, FOLATE, FERRITIN, TIBC, IRON, RETICCTPCT in the last 72 hours. Sepsis Labs: Recent Labs  Lab 10/20/20 0615 10/21/20 0613 10/25/20 1130 10/25/20 1450 10/25/20 1915 10/26/20 0504  PROCALCITON  --   --   --   --  73.80  --   WBC 18.8*  12.6* 5.0  --   --  4.7  LATICACIDVEN  --   --  4.9* 4.2*  --   --     Microbiology Recent Results (from the past 240 hour(s))  Culture, blood (Routine x 2)     Status: None (Preliminary result)   Collection Time: 10/25/20  2:49 PM   Specimen: BLOOD  Result Value Ref Range Status   Specimen Description BLOOD BLOOD LEFT HAND  Final   Special Requests   Final    BOTTLES DRAWN AEROBIC AND ANAEROBIC Blood Culture results may not be optimal due to an inadequate volume of blood received in culture bottles   Culture   Final    NO GROWTH < 24 HOURS Performed at [facility], [address], [place], [place] [id number]    Report Status PENDING  Incomplete  Culture, blood (Routine x 2)     Status: None (Preliminary result)   Collection Time: 10/25/20  3:29 PM   Specimen: BLOOD  Result Value Ref Range Status   Specimen Description BLOOD BLOOD LEFT HAND  Final   Special Requests   Final    BOTTLES DRAWN AEROBIC AND ANAEROBIC Blood Culture adequate volume   Culture   Final    NO GROWTH < 24 HOURS Performed at [facility], [address] [place], [place] [address]    Report Status PENDING  Incomplete  Resp Panel by RT-PCR (Flu A&B, Covid) Nasopharyngeal Swab     Status: Abnormal   Collection Time: 10/25/20  3:30 PM   Specimen: Nasopharyngeal Swab; Nasopharyngeal(NP) swabs in vial transport medium  Result Value Ref Range Status   SARS Coronavirus 2 by RT PCR  POSITIVE (A) NEGATIVE Final    Comment: RESULT CALLED TO, READ BACK BY AND VERIFIED WITH: laura hernandez 10/25/20 at 1641 by acr (NOTE) SARS-CoV-2 target nucleic acids are DETECTED.  The SARS-CoV-2 RNA is generally detectable in upper respiratory specimens during the acute phase of infection. Positive results are indicative of the presence of the identified virus, but do not rule out bacterial infection or co-infection with other pathogens not detected by the test. Clinical correlation with patient history  and other diagnostic information is necessary to determine patient infection status. The expected result is Negative.  Fact Sheet for Patients: EntrepreneurPulse.com.au  Fact Sheet for Healthcare Providers: IncredibleEmployment.be  This test is not yet approved or cleared by the [place] FDA and  has been authorized for detection and/or diagnosis of SARS-CoV-2 by FDA under an Emergency Use Authorization (EUA).  This EUA will remain in effect (meaning this test c an be used) for the duration of  the COVID-19 declaration under Section 564(b)(1) of the Act, 21 U.S.C. section 360bbb-3(b)(1), unless the authorization is terminated or revoked sooner.     Influenza A by PCR NEGATIVE NEGATIVE Final   Influenza B by PCR NEGATIVE NEGATIVE Final    Comment: (NOTE) The Xpert Xpress SARS-CoV-2/FLU/RSV plus assay is intended as an aid in the diagnosis of influenza from Nasopharyngeal swab specimens and should not be used as a sole basis for treatment. Nasal washings and aspirates are unacceptable for Xpert Xpress SARS-CoV-2/FLU/RSV testing.  Fact Sheet for Patients: EntrepreneurPulse.com.au  Fact Sheet for Healthcare Providers: IncredibleEmployment.be  This test is not yet approved or cleared by the [place] FDA and has been authorized for detection and/or diagnosis of SARS-CoV-2 by FDA under an Emergency Use Authorization (EUA). This EUA will remain in effect (meaning this test can be used) for the duration of the COVID-19 declaration under Section 564(b)(1) of the Act, 21 U.S.C. section 360bbb-3(b)(1), unless the authorization is terminated or revoked.  Performed at [facility], [address] [place], [place] [address]     Procedures and diagnostic studies:  DG Lumbar Spine Complete  Result Date: 10/25/2020 CLINICAL DATA:  Pain following fall EXAM: LUMBAR SPINE - COMPLETE 4+ VIEW  COMPARISON:  None. FINDINGS: Frontal, lateral, spot lumbosacral lateral, and bilateral oblique views were obtained. There are 5 non-rib-bearing lumbar type vertebral bodies. There is no fracture or spondylolisthesis. The disc spaces appear normal. There is no appreciable facet arthropathy. IMPRESSION: No fracture or spondylolisthesis.  No evident arthropathy. Electronically Signed   By: Lowella Grip III M.D.   On: 10/25/2020 14:08   MR BRAIN W WO CONTRAST  Addendum Date: 10/26/2020   ADDENDUM REPORT: 10/26/2020 13:14 ADDENDUM: The conclusion mentions that breast cancer is thought unlikely for the orbital findings. On further review, given the patient's widespread metastatic disease and given that breast cancer does have a propensity for orbital metastasis (occasionally multifocal/bilateral), metastatic breast cancer is not excluded. The additional differential considerations mentioned in the conclusion remain possibilities, particularly orbital lymphoma given the restricted diffusion. Consider ophthalmology consultation. Findings discussed with Dr. Jennye Boroughs via telephone at 1:10 pm. Electronically Signed   By: Margaretha Sheffield MD   On: 10/26/2020 13:14   Result Date: 10/26/2020 CLINICAL DATA:  Neuro deficit, acute stroke suspected. Right leg paresthesias and discomfort. EXAM: MRI HEAD WITHOUT AND WITH CONTRAST TECHNIQUE: Multiplanar, multiecho pulse sequences of the brain and surrounding structures were obtained without and with intravenous contrast. CONTRAST:  42mL GADAVIST GADOBUTROL 1 MMOL/ML IV SOLN COMPARISON:  MRI  November 12 21. FINDINGS: Brain: No acute infarction, hemorrhage, hydrocephalus, extra-axial collection or intracranial mass lesion. No abnormal intracranial enhancement. Vascular: Major arterial flow voids are maintained at the skull base. Skull and upper cervical spine: Increased size/conspicuity of scattered T1 hypointense metastasis in the upper cervical spine. Additionally,  increased size of the clival skull metastasis, now measuring 1.4 cm. T1 hypointensity of the right occipital calvarium appears similar to prior and is concerning for osseous metastatic disease. Sinuses/Orbits: Interval development of marked enlargement of multiple extraocular muscles, including bulky enlargement of the left medial and lateral rectus muscles and right lateral rectus muscle. Additionally, there is evidence of involvement of the right medial rectus muscle anteriorly (series 17, image 26 and series 5, image 19), superior rectus musculature anteriorly bilaterally (series 17, 26; series 5, image 22) and the inferior rectus muscle on the right posteriorly (series 17, image 24; series 5, image 16). The enlarged extra-ocular muscles demonstrate T2 hypointensity and restricted diffusion, suggesting high cellularity. There is involvement of the muscle bodies as well as tendinous insertions in areas. Other: No sizable mastoid effusions. IMPRESSION: 1. No evidence of acute infarct or intracranial brain metastasis. 2. Interval development of marked enlargement of multiple bilateral extraocular muscles, detailed above and most prominently involving the medial and lateral rectus. The enlarged extra-ocular muscles demonstrate T2 hypointensity and restricted diffusion, suggesting high cellularity. While nonspecific, this finding can be seen with orbital lymphoma. Idiopathic orbital inflammatory pseudotumor and IgG4-related orbital disease are additional differential considerations. Metastatic breast cancer was considered given the clinical history, but thought unlikely given the diffuse nature of the process. 3. Partially imaged metastases in the upper cervical spine and clival metastasis appear larger and more conspicuous, concerning for progression of bony metastatic disease. Similar T1 hypointensity right occipital calvarium, concerning for an additional site of osseous metastatic disease. Findings discussed with  Dr. Jaclynn Guarneri at 5:52 p.m. via telephone. Electronically Signed: By: Margaretha Sheffield MD On: 10/25/2020 18:17   US Venous Img Lower Unilateral Right (DVT)  Result Date: 10/25/2020 CLINICAL DATA:  Initial evaluation for acute right lower extremity pain and swelling. EXAM: RIGHT LOWER EXTREMITY VENOUS DOPPLER ULTRASOUND TECHNIQUE: Gray-scale sonography with graded compression, as well as color Doppler and duplex ultrasound were performed to evaluate the lower extremity deep venous systems from the level of the common femoral vein and including the common femoral, femoral, profunda femoral, popliteal and calf veins including the posterior tibial, peroneal and gastrocnemius veins when visible. The superficial great saphenous vein was also interrogated. Spectral Doppler was utilized to evaluate flow at rest and with distal augmentation maneuvers in the common femoral, femoral and popliteal veins. COMPARISON:  None. FINDINGS: Contralateral Common Femoral Vein: Respiratory phasicity is normal and symmetric with the symptomatic side. No evidence of thrombus. Normal compressibility. Common Femoral Vein: No evidence of thrombus. Normal compressibility, respiratory phasicity and response to augmentation. Saphenofemoral Junction: Echogenic thrombus seen at the saphenofemoral junction. Profunda Femoral Vein: No evidence of thrombus. Normal compressibility and flow on color Doppler imaging. Femoral Vein: Echogenic partially occlusive thrombus seen throughout the proximal, mid, and distal right femoral vein. Popliteal Vein: Echogenic occlusive thrombus seen throughout the right popliteal vein. Calf Veins: Echogenic occlusive thrombus seen within the right peroneal and posterior tibial veins. Superficial Great Saphenous Vein: No evidence of thrombus. Normal compressibility. Venous Reflux:  None. Other Findings:  None. IMPRESSION: Positive study with extensive acute DVT extending from the proximal right femoral vein through the  veins of the calf. Electronically Signed   By:  Jeannine Boga M.D.   On: 10/25/2020 18:40   DG Chest Port 1 View  Result Date: 10/25/2020 CLINICAL DATA:  Chest pain. EXAM: PORTABLE CHEST 1 VIEW COMPARISON:  Single-view of the chest 10/15/2020 and 10/13/2020. CT chest 10/07/2020. FINDINGS: Right pleural effusion and opacities projecting over the right chest consistent with known chest wall and intrathoracic metastatic breast carcinoma again seen. Scattered opacities in the left chest are also again seen. Aeration has improved bilaterally since the most recent exam. Small right pleural effusion is noted. The left lung is clear. Heart size is normal. IMPRESSION: Aeration in both lungs appears improved compared to the most recent study in this patient with extensive metastatic breast cancer. No new abnormality. Electronically Signed   By: Inge Rise M.D.   On: 10/25/2020 12:19   DG HIP UNILAT W OR W/O PELVIS 2-3 VIEWS RIGHT  Result Date: 10/25/2020 CLINICAL DATA:  History of metastatic breast cancer. Patient status post fall. Initial encounter. EXAM: DG HIP (WITH OR WITHOUT PELVIS) 2-3V RIGHT COMPARISON:  PET CT scan 07/19/2020. FINDINGS: No acute bony or joint abnormality is identified. Scattered sclerotic lesions are present in both hips and the pelvis consistent with known metastatic breast cancer. Soft tissues are negative. IMPRESSION: No acute abnormality. Scattered sclerotic lesions consistent with known metastatic breast cancer. Electronically Signed   By: Inge Rise M.D.   On: 10/25/2020 14:09               LOS: 0 days   Melodie Ashworth  [facility]   Pager on [email or web address]. If 7PM-7AM, please contact night-coverage at [email or web address]     10/26/2020, 4:17 PM

## 2020-10-26 NOTE — Progress Notes (Signed)
   10/26/20 0305  Clinical Encounter Type  Visited With Patient  Visit Type Initial;Spiritual support;Social support  Referral From Nurse  Consult/Referral To Chaplain   Chaplain attempted to call PT, but PT was not available. Chaplain gave the AD booklet to nurse to hand to the PT.

## 2020-10-26 NOTE — Progress Notes (Signed)
Initial Nutrition Assessment  DOCUMENTATION CODES:   Severe malnutrition in context of acute illness/injury  INTERVENTION:   Ensure Enlive po QID, each supplement provides 350 kcal and 20 grams of protein  Magic cup TID with meals, each supplement provides 290 kcal and 9 grams of protein  MVI po daily   Pt at high refeed risk; recommend monitor potassium, magnesium and phosphorus labs daily until stable  NUTRITION DIAGNOSIS:   Severe Malnutrition related to acute illness (HCAP, COVID 19) as evidenced by moderate fat depletion,moderate muscle depletion,severe muscle depletion, 12 percent weight loss in < 1 month.  GOAL:   Patient will meet greater than or equal to 90% of their needs  MONITOR:   PO intake,Supplement acceptance,Labs,Weight trends,Skin,I & O's  REASON FOR ASSESSMENT:   Consult Assessment of nutrition requirement/status  ASSESSMENT:   55 y.o. female with medical history significant for right fungating breast cancer with metastasis (stage IV) to the lungs on IV chemotherapy, failure to thrive, malignant pleural effusion, hemothorax and debility who presents to the emergency department for chief concerns of weakness and found to have COVID 19   Met with pt in room today. Pt is well known to this RD from numerous previous admits. Pt generally with good appetite and oral intake but reports poor appetite and oral intake for the past month; pt previously admitted with HCAP ~3 weeks ago and now is readmitted with COVID 19. Pt currently on a full liquid diet. Pt reports that she ate some ice cream and broth for lunch today; pt also reports that she has been drinking all of her Ensure supplements. Pt reports that she typically drinks 2-3 Ensure per day at home (chocolate or strawberry). Per chart, pt is down 22lbs(12%) over the past month; this is severe weight loss. Pt meets for severe malnutrition today. RD spoke with patient today and encouraged supplements and good oral  intake. RD will add supplements to help pt meet her estimated needs. Pt is at high refeed risk.   Medications reviewed and include: vitamin C, oscal, dexamethasone, marinol, insulin, Mg chloride, MVI, miralax, zinc, 5% dextrose with KCl @100ml /hr  Labs reviewed: Na 152(H), K 4.0 wnl, BUN 40(H) Hgb 7.9(L), Hct 28.6(L), MCH 24.6(L), MCHC 27.6(L) cbgs- 132, 126 x 24 hrs  NUTRITION - FOCUSED PHYSICAL EXAM:  Flowsheet Row Most Recent Value  Orbital Region Mild depletion  Upper Arm Region Moderate depletion  Thoracic and Lumbar Region Moderate depletion  Buccal Region Moderate depletion  Temple Region Moderate depletion  Clavicle Bone Region Severe depletion  Clavicle and Acromion Bone Region Severe depletion  Scapular Bone Region Moderate depletion  Dorsal Hand Severe depletion  Patellar Region Moderate depletion  Anterior Thigh Region Moderate depletion  Posterior Calf Region Severe depletion  Edema (RD Assessment) None  Hair Reviewed  Eyes Reviewed  Mouth Reviewed  Skin Reviewed  Nails Reviewed     Diet Order:   Diet Order            Diet full liquid Room service appropriate? Yes; Fluid consistency: Thin  Diet effective now                EDUCATION NEEDS:   Education needs have been addressed  Skin:  Skin Assessment: Reviewed RN Assessment (Fungating tumor to right breast)  Last BM:  3/11- per chart  Height:   Ht Readings from Last 1 Encounters:  10/25/20 5' 10"  (1.778 m)    Weight:   Wt Readings from Last 1 Encounters:  10/26/20  68.6 kg    Ideal Body Weight:  68 kg  BMI:  Body mass index is 21.7 kg/m.  Estimated Nutritional Needs:   Kcal:  1900-2200kcal/day  Protein:  95-110g/day  Fluid:  2.0-2.3L/day  Koleen Distance MS, RD, LDN Please refer to [facility] for RD and/or RD on-call/weekend/after hours pager

## 2020-10-26 NOTE — Progress Notes (Signed)
PT Cancellation Note  Patient Details Name: Jessica Willis MRN: [id number] DOB: 02-03-1966   Cancelled Treatment:    Reason Eval/Treat Not Completed: Patient not medically ready.  PT consult received.  Chart reviewed.  Pt noted with extensive acute DVT extending from proximal R femoral vein through veins of the calf.  Per therapy guidelines for acute DVT, will hold therapy until 5-6 hours after therapeutic dose of anti-coagulation is given and pt is determined to be medically appropriate for therapy participation.  D/t this, will hold PT at this time and re-attempt PT evaluation at a later date/time as medically appropriate.  Leitha Bleak, PT 10/26/20, 11:06 AM

## 2020-10-26 NOTE — Consult Note (Signed)
ANTICOAGULATION CONSULT NOTE - Consult  Pharmacy Consult for Eliquis Indication: DVT  No Known Allergies  Patient Measurements: Height: 5\' 10"  (177.8 cm) Weight: 68.6 kg (151 lb 3.8 oz) IBW/kg (Calculated) : 68.5  Vital Signs: Temp: 97.9 F (36.6 C) (03/16 0400) Temp Source: Oral (03/16 0400) BP: 121/73 (03/16 0400) Pulse Rate: 100 (03/16 0400)  Labs: Recent Labs    10/25/20 1130 10/25/20 1915 10/26/20 0504  HGB 9.3*  --  7.9*  HCT 33.2*  --  28.6*  PLT 195  --  165  LABPROT 19.9*  --   --   INR 1.8*  --   --   CREATININE 0.62  --  0.47  CKTOTAL  --  44  --     Estimated Creatinine Clearance: 84.9 mL/min (by C-G formula based on SCr of 0.47 mg/dL).   Medications:  Inpatient: enox 40mg  q24h >> eliquis 10mg  BID x7d; 5mg  BID thereafter. No PTA AC/APT & no known drug allergies.  Assessment: 55 y.o. 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 Recently hospitalized for PNA and discharged on 3/11, but pt reports weakness and frequent falls since discharge. Pt endorses Rt leg pain, LE [place] w/ new acute DVT. Pharmacy consulted for initiation and monitoring of eliquis.  Baseline:  Hgb 9.3>7.9; DDimer >20 x2;  Plts: 195>165  Goal of Therapy:  Monitor platelets by anticoagulation protocol: Yes   Plan:   - For new acute DVT, w/o AC or APT PTA, will Start eliquis 10mg  BID x7d followed by 5mg  BID thereafter.  - Discontinue enoxaparin prophylactic dose. - CTM daily CBC while on therapeutic AC. Shanon Brow Beers 10/26/2020,10:37 AM

## 2020-10-26 NOTE — Consult Note (Signed)
[facility] Nurse Consult Note: Patient is COVID positive and admitted for weakness and failure to thrive in the setting of Stage IV breast cancer with metastasis to lungs.   Reason for Consult: Fungating tumor to right breast  Wound type: cancerous lesion Pressure Injury POA: NA Measurement: 90% right breast gray, raised and atypical in appearance.   Wound bed:see above Drainage (amount, consistency, odor) moderate tan effluent  Necrotic odor.  Periwound:intact  Dressing procedure/placement/frequency:Topical treatment orders provided for bedside nurses to perform as follows:Apply double-folded xeroform gauze to right breast Q day, then cover with ABD pads. Cut the crotch out of a pair of mesh underwear to create a "tank top" and use this to hold dressings in place, and avoid use of tape. Moisten dressing each time with NS to assist with removal Will not follow at this time.  Please re-consult if needed.  Domenic Moras MSN, RN, FNP-BC CWON Wound, Ostomy, Continence Nurse Pager [phone]

## 2020-10-27 DIAGNOSIS — R627 Adult failure to thrive: Secondary | ICD-10-CM | POA: Diagnosis not present

## 2020-10-27 DIAGNOSIS — I82411 Acute embolism and thrombosis of right femoral vein: Secondary | ICD-10-CM | POA: Diagnosis not present

## 2020-10-27 DIAGNOSIS — L8990 Pressure ulcer of unspecified site, unspecified stage: Secondary | ICD-10-CM | POA: Insufficient documentation

## 2020-10-27 DIAGNOSIS — E870 Hyperosmolality and hypernatremia: Secondary | ICD-10-CM | POA: Diagnosis not present

## 2020-10-27 DIAGNOSIS — C7951 Secondary malignant neoplasm of bone: Secondary | ICD-10-CM | POA: Diagnosis not present

## 2020-10-27 LAB — GLUCOSE, CAPILLARY
Glucose-Capillary: 88 mg/dL (ref 70–99)
Glucose-Capillary: 101 mg/dL — ABNORMAL HIGH (ref 70–99)
Glucose-Capillary: 100 mg/dL — ABNORMAL HIGH (ref 70–99)
Glucose-Capillary: 96 mg/dL (ref 70–99)

## 2020-10-27 LAB — CBC WITH DIFFERENTIAL/PLATELET
Abs Immature Granulocytes: 0.09 10*3/uL — ABNORMAL HIGH (ref 0.00–0.07)
Basophils Absolute: 0 10*3/uL (ref 0.0–0.1)
Basophils Relative: 1 %
Eosinophils Absolute: 0 10*3/uL (ref 0.0–0.5)
Eosinophils Relative: 0 %
HCT: 30.7 % — ABNORMAL LOW (ref 36.0–46.0)
Hemoglobin: 8.7 g/dL — ABNORMAL LOW (ref 12.0–15.0)
Immature Granulocytes: 4 %
Lymphocytes Relative: 23 %
Lymphs Abs: 0.5 10*3/uL — ABNORMAL LOW (ref 0.7–4.0)
MCH: 25.2 pg — ABNORMAL LOW (ref 26.0–34.0)
MCHC: 28.3 g/dL — ABNORMAL LOW (ref 30.0–36.0)
MCV: 89 fL (ref 80.0–100.0)
Monocytes Absolute: 0.2 10*3/uL (ref 0.1–1.0)
Monocytes Relative: 11 %
Neutro Abs: 1.3 10*3/uL — ABNORMAL LOW (ref 1.7–7.7)
Neutrophils Relative %: 61 %
Platelets: 196 10*3/uL (ref 150–400)
RBC: 3.45 MIL/uL — ABNORMAL LOW (ref 3.87–5.11)
RDW: 17.2 % — ABNORMAL HIGH (ref 11.5–15.5)
WBC: 2.1 10*3/uL — ABNORMAL LOW (ref 4.0–10.5)
nRBC: 2.4 % — ABNORMAL HIGH (ref 0.0–0.2)

## 2020-10-27 LAB — COMPREHENSIVE METABOLIC PANEL
ALT: 18 U/L (ref 0–44)
AST: 64 U/L — ABNORMAL HIGH (ref 15–41)
Albumin: 2.4 g/dL — ABNORMAL LOW (ref 3.5–5.0)
Alkaline Phosphatase: 288 U/L — ABNORMAL HIGH (ref 38–126)
Anion gap: 11 (ref 5–15)
BUN: 33 mg/dL — ABNORMAL HIGH (ref 6–20)
CO2: 31 mmol/L (ref 22–32)
Calcium: 9.3 mg/dL (ref 8.9–10.3)
Chloride: 102 mmol/L (ref 98–111)
Creatinine, Ser: 0.41 mg/dL — ABNORMAL LOW (ref 0.44–1.00)
GFR, Estimated: >60 mL/min (ref >60)
Glucose, Bld: 94 mg/dL (ref 70–99)
Potassium: 5.6 mmol/L — ABNORMAL HIGH (ref 3.5–5.1)
Sodium: 144 mmol/L (ref 135–145)
Total Bilirubin: 0.7 mg/dL (ref 0.3–1.2)
Total Protein: 5.8 g/dL — ABNORMAL LOW (ref 6.5–8.1)

## 2020-10-27 LAB — C-REACTIVE PROTEIN: CRP: 16.9 mg/dL — ABNORMAL HIGH (ref <1.0)

## 2020-10-27 LAB — PHOSPHORUS: Phosphorus: 3.4 mg/dL (ref 2.5–4.6)

## 2020-10-27 LAB — MAGNESIUM: Magnesium: 2.1 mg/dL (ref 1.7–2.4)

## 2020-10-27 LAB — D-DIMER, QUANTITATIVE: D-Dimer, Quant: 13.89 ug/mL-FEU — ABNORMAL HIGH (ref 0.00–0.50)

## 2020-10-27 NOTE — Progress Notes (Signed)
OT Cancellation Note  Patient Details Name: Dorotea Hand MRN: [id number] DOB: 1966/05/05   Cancelled Treatment:    Reason Eval/Treat Not Completed: Medical issues which prohibited therapy. Chart reviewed - pt noted to have K+ critically high at 5.6; contraindicated for exertional activity at this time. Will continue to follow and initiate services as pt medically appropriate to participate in therapy.   Dessie Coma, M.S. OTR/L  10/27/20, 8:59 AM  ascom [phone]

## 2020-10-27 NOTE — Progress Notes (Addendum)
Progress Note    Jessica Willis  MOQ:[id number] DOB: Mar 30, 1966  DOA: 10/25/2020 PCP: [facility]      Brief Narrative:    Medical records reviewed and are as summarized below:  Jessica Willis is a 55 y.o. 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  She was eventually discharged home on home oxygen for chronic hypoxic respiratory failure.  She presented to the hospital again on 10/25/2020 because of poor oral intake, generalized weakness and frequent falls at home since discharge from the hospital.  She also complained of swelling and tingling in the right lower extremity.  She was found to have hypernatremia, hypokalemia and right lower extremity DVT.  She was treated with IV fluids hypokalemia was repleted.  She was started on Eliquis for treatment of DVT.      Assessment/Plan:   Principal Problem:   Failure to thrive in adult Active Problems:   Breast mass, right   Metastatic breast cancer (HCC)   Anemia, chronic disease   Iron deficiency anemia   Bone metastasis (HCC)   Port-A-Cath in place   Chemotherapy-induced nausea   Poor appetite   Weakness   Dehydration   COVID-19 virus infection   Protein-calorie malnutrition, severe   Hypernatremia   Acute deep vein thrombosis (DVT) of right femoral vein (HCC)   Pressure injury of skin   Nutrition Problem: Severe Malnutrition Etiology: acute illness (HCAP, COVID 19)  Signs/Symptoms: moderate fat depletion,moderate muscle depletion,severe muscle depletion,percent weight loss Percent weight loss: 12  %   Body mass index is 22.11 kg/m.    Hypernatremia, dehydration: Improved.  Discontinue IV fluids.  Acute right femoral DVT: Continue Eliquis  Metastatic triple negative right breast cancer with high tumor burden: Chemotherapy was initiated on 3/4/2022but progressed after first-line palliative chemotherapy with Taxol.  Follow-up with oncologist as an outpatient.  Received a call from radiologist Dr. Ronnald Ramp, today about addendum to MRI brain.  He said metastatic disease to extraocular muscles cannot be ruled out.  Outpatient follow-up with ophthalmologist recommended.  Consult palliative care team  Hypokalemia: Resolved.  Hyperkalemia: Discontinue potassium-containing IV fluids.  Repeat BMP tomorrow.  Asymptomatic COVID-19 infection: No pneumonia on chest x-ray.  No evidence of sepsis.  She likely has SIRS from dehydration.  Continue IV remdesivir.  Chronic hypoxic respiratory failure: Continue 3 L/min oxygen via nasal cannula.  Severe protein calorie malnutrition, failure to thrive: Encourage use of dietary supplements and adequate oral intake.  Follow-up with dietitian.  Generalized weakness: PT and OT evaluation  Stage II decubitus ulcers of the right buttocks (present on admission): Continue local wound care.  Patient encouraged to turn frequently.  Recent discharge from the hospital on 10/21/2020 after hospitalization for sepsis secondary to pneumonia complicated by acute hypoxic respiratory failure.  Poor prognosis.  She remains a full code and she is not ready for hospice.  Consult palliative care team        Diet Order            Diet full liquid Room service appropriate? Yes; Fluid consistency: Thin  Diet effective now  Consultants:  None  Procedures:  None    Medications:   . apixaban  10 mg Oral BID   Followed by  . [START ON 11/02/2020] apixaban  5 mg Oral BID  . vitamin C  500 mg Oral Daily  . calcium carbonate  1,250 mg  Oral Q breakfast  . Chlorhexidine Gluconate Cloth  6 each Topical Daily  . dronabinol  5 mg Oral BID AC  . feeding supplement  237 mL Oral QID  . insulin aspart  0-5 Units Subcutaneous QHS  . insulin aspart  0-9 Units Subcutaneous TID WC  . magic mouthwash w/lidocaine  15 mL Oral TID  . magnesium chloride  1 tablet Oral Daily  . metoprolol tartrate  50 mg Oral BID  . multivitamin with minerals  1 tablet Oral Daily  . polyethylene glycol  17 g Oral BID  . zinc sulfate  220 mg Oral Daily   Continuous Infusions: . remdesivir 100 mg in NS 100 mL 100 mg (10/27/20 0927)     Anti-infectives (From admission, onward)   Start     Dose/Rate Route Frequency Ordered Stop   10/26/20 1000  remdesivir 100 mg in sodium chloride 0.9 % 100 mL IVPB       "Followed by" Linked Group Details   100 mg 200 mL/hr over 30 Minutes Intravenous Daily 10/25/20 1648 10/30/20 0959   10/25/20 1800  remdesivir 200 mg in sodium chloride 0.9% 250 mL IVPB       "Followed by" Linked Group Details   200 mg 580 mL/hr over 30 Minutes Intravenous Once 10/25/20 1648 10/25/20 2130             Family Communication/Anticipated D/C date and plan/Code Status   DVT prophylaxis: Place TED hose Start: 10/25/20 1530 apixaban (ELIQUIS) tablet 10 mg  apixaban (ELIQUIS) tablet 5 mg     Code Status: Full Code  Family Communication: None Disposition Plan:    Status is: Inpatient  Remains inpatient appropriate because:IV treatments appropriate due to intensity of illness or inability to take PO and Inpatient level of care appropriate due to severity of illness   Dispo: The patient is from: Home              Anticipated d/c is to: Home              Patient currently is not medically stable to d/c.   Difficult to place patient No           Subjective:   C/o generalized weakness, tingling and swelling in the right lower extremity  Objective:    Vitals:   10/27/20 0815 10/27/20 1000 10/27/20 1100  10/27/20 1200  BP: (!) 113/56 (!) 96/55 106/60 (!) 109/59  Pulse: (!) 110 (!) 119 (!) 110 (!) 111  Resp: 18 (!) 24 18 18   Temp: 98 F (36.7 C)  98 F (36.7 C) 98 F (36.7 C)  TempSrc:    Oral  SpO2: 100% 100% 98% 97%  Weight:      Height:       No data found.   Intake/Output Summary (Last 24 hours) at 10/27/2020 1457 Last data filed at 10/27/2020 0403 Gross per 24 hour  Intake 1678.07 ml  Output 350 ml  Net 1328.07 ml   Filed Weights   10/25/20 2100 10/26/20 0400 10/27/20 0400  Weight: 69.2 kg 68.6 kg 69.9 kg    Exam:  GEN: NAD SKIN: Fungating multinodular mass on right breast.  Stage II decubitus  ulcer on left and right buttocks.  Port-A-Cath in left upper chest EYES: No pallor or icterus ENT: MMM CV: RRR, tachycardic PULM: CTA B ABD: soft, ND, NT, +BS CNS: AAO x 3, non focal EXT: Right lower extremity swelling without tenderness or erythema      Pressure Injury 10/25/20 Buttocks Left;Right Stage 2 -  Partial thickness loss of dermis presenting as a shallow open injury with a red, pink wound bed without slough. (Active)  10/25/20 2100  Location: Buttocks  Location Orientation: Left;Right  Staging: Stage 2 -  Partial thickness loss of dermis presenting as a shallow open injury with a red, pink wound bed without slough.  Wound Description (Comments):   Present on Admission: Yes     Data Reviewed:   I have personally reviewed following labs and imaging studies:  Labs: Labs show the following:   Basic Metabolic Panel: Recent Labs  Lab 10/21/20 0613 10/25/20 1130 10/26/20 0504 10/27/20 0611  NA 143 151* 152* 144  K 3.4* 3.2* 4.0 5.6*  CL 100 105 108 102  CO2 34* 33* 33* 31  GLUCOSE 82 193* 131* 94  BUN 25* 44* 40* 33*  CREATININE 0.35* 0.62 0.47 0.41*  CALCIUM 8.1* 8.7* 9.0 9.3  MG  --  2.9*  --  2.1  PHOS  --  2.3*  --  3.4   GFR Estimated Creatinine Clearance: 84.9 mL/min (A) (by C-G formula based on SCr of 0.41 mg/dL (L)). Liver Function  Tests: Recent Labs  Lab 10/25/20 1130 10/26/20 0504 10/27/20 0611  AST 86* 63* 64*  ALT 24 17 18   ALKPHOS 300* 272* 288*  BILITOT 0.8 0.7 0.7  PROT 6.8 5.7* 5.8*  ALBUMIN 2.9* 2.5* 2.4*   No results for input(s): LIPASE, AMYLASE in the last 168 hours. No results for input(s): AMMONIA in the last 168 hours. Coagulation profile Recent Labs  Lab 10/25/20 1130  INR 1.8*    CBC: Recent Labs  Lab 10/21/20 0613 10/25/20 1130 10/26/20 0504 10/27/20 0611  WBC 12.6* 5.0 4.7 2.1*  NEUTROABS 11.0* 3.8 3.4 1.3*  HGB 8.7* 9.3* 7.9* 8.7*  HCT 29.9* 33.2* 28.6* 30.7*  MCV 85.4 87.8 89.1 89.0  PLT 263 195 165 196   Cardiac Enzymes: Recent Labs  Lab 10/25/20 1915  CKTOTAL 44   BNP (last 3 results) No results for input(s): PROBNP in the last 8760 hours. CBG: Recent Labs  Lab 10/26/20 1249 10/26/20 1626 10/26/20 2155 10/27/20 0838 10/27/20 1204  GLUCAP 126* 121* 120* 88 101*   D-Dimer: Recent Labs    10/26/20 0504 10/27/20 0611  DDIMER >20.00* 13.89*   Hgb A1c: No results for input(s): HGBA1C in the last 72 hours. Lipid Profile: No results for input(s): CHOL, HDL, LDLCALC, TRIG, CHOLHDL, LDLDIRECT in the last 72 hours. Thyroid function studies: No results for input(s): TSH, T4TOTAL, T3FREE, THYROIDAB in the last 72 hours.  Invalid input(s): FREET3 Anemia work up: No results for input(s): VITAMINB12, FOLATE, FERRITIN, TIBC, IRON, RETICCTPCT in the last 72 hours. Sepsis Labs: Recent Labs  Lab 10/21/20 7622 10/25/20 1130 10/25/20 1450 10/25/20 1915 10/26/20 0504 10/27/20 0611  PROCALCITON  --   --   --  73.80  --   --   WBC 12.6* 5.0  --   --  4.7 2.1*  LATICACIDVEN  --  4.9* 4.2*  --   --   --     Microbiology Recent Results (from the past 240 hour(s))  Culture, blood (Routine x 2)  Status: None (Preliminary result)   Collection Time: 10/25/20  2:49 PM   Specimen: BLOOD  Result Value Ref Range Status   Specimen Description BLOOD BLOOD LEFT HAND   Final   Special Requests   Final    BOTTLES DRAWN AEROBIC AND ANAEROBIC Blood Culture results may not be optimal due to an inadequate volume of blood received in culture bottles   Culture   Final    NO GROWTH 2 DAYS Performed at [facility], [address] [place], [place] [address]    Report Status PENDING  Incomplete  Culture, blood (Routine x 2)     Status: None (Preliminary result)   Collection Time: 10/25/20  3:29 PM   Specimen: BLOOD  Result Value Ref Range Status   Specimen Description BLOOD BLOOD LEFT HAND  Final   Special Requests   Final    BOTTLES DRAWN AEROBIC AND ANAEROBIC Blood Culture adequate volume   Culture   Final    NO GROWTH 2 DAYS Performed at [facility], [address] [place], [place] [address]    Report Status PENDING  Incomplete  Resp Panel by RT-PCR (Flu A&B, Covid) Nasopharyngeal Swab     Status: Abnormal   Collection Time: 10/25/20  3:30 PM   Specimen: Nasopharyngeal Swab; Nasopharyngeal(NP) swabs in vial transport medium  Result Value Ref Range Status   SARS Coronavirus 2 by RT PCR POSITIVE (A) NEGATIVE Final    Comment: RESULT CALLED TO, READ BACK BY AND VERIFIED WITH: laura hernandez 10/25/20 at 1641 by acr (NOTE) SARS-CoV-2 target nucleic acids are DETECTED.  The SARS-CoV-2 RNA is generally detectable in upper respiratory specimens during the acute phase of infection. Positive results are indicative of the presence of the identified virus, but do not rule out bacterial infection or co-infection with other pathogens not detected by the test. Clinical correlation with patient history and other diagnostic information is necessary to determine patient infection status. The expected result is Negative.  Fact Sheet for Patients: EntrepreneurPulse.com.au  Fact Sheet for Healthcare Providers: IncredibleEmployment.be  This test is not yet approved or cleared by the [place] FDA and   has been authorized for detection and/or diagnosis of SARS-CoV-2 by FDA under an Emergency Use Authorization (EUA).  This EUA will remain in effect (meaning this test c an be used) for the duration of  the COVID-19 declaration under Section 564(b)(1) of the Act, 21 U.S.C. section 360bbb-3(b)(1), unless the authorization is terminated or revoked sooner.     Influenza A by PCR NEGATIVE NEGATIVE Final   Influenza B by PCR NEGATIVE NEGATIVE Final    Comment: (NOTE) The Xpert Xpress SARS-CoV-2/FLU/RSV plus assay is intended as an aid in the diagnosis of influenza from Nasopharyngeal swab specimens and should not be used as a sole basis for treatment. Nasal washings and aspirates are unacceptable for Xpert Xpress SARS-CoV-2/FLU/RSV testing.  Fact Sheet for Patients: EntrepreneurPulse.com.au  Fact Sheet for Healthcare Providers: IncredibleEmployment.be  This test is not yet approved or cleared by the [place] FDA and has been authorized for detection and/or diagnosis of SARS-CoV-2 by FDA under an Emergency Use Authorization (EUA). This EUA will remain in effect (meaning this test can be used) for the duration of the COVID-19 declaration under Section 564(b)(1) of the Act, 21 U.S.C. section 360bbb-3(b)(1), unless the authorization is terminated or revoked.  Performed at [facility], [address] [place], [place] [address]     Procedures and diagnostic studies:  MR BRAIN W WO  CONTRAST  Addendum Date: 10/26/2020   ADDENDUM REPORT: 10/26/2020 13:14 ADDENDUM: The conclusion mentions that breast cancer is thought unlikely for the orbital findings. On further review, given the patient's widespread metastatic disease and given that breast cancer does have a propensity for orbital metastasis (occasionally multifocal/bilateral), metastatic breast cancer is not excluded. The additional differential considerations mentioned in the  conclusion remain possibilities, particularly orbital lymphoma given the restricted diffusion. Consider ophthalmology consultation. Findings discussed with Dr. Jennye Boroughs via telephone at 1:10 pm. Electronically Signed   By: Margaretha Sheffield MD   On: 10/26/2020 13:14   Result Date: 10/26/2020 CLINICAL DATA:  Neuro deficit, acute stroke suspected. Right leg paresthesias and discomfort. EXAM: MRI HEAD WITHOUT AND WITH CONTRAST TECHNIQUE: Multiplanar, multiecho pulse sequences of the brain and surrounding structures were obtained without and with intravenous contrast. CONTRAST:  50mL GADAVIST GADOBUTROL 1 MMOL/ML IV SOLN COMPARISON:  MRI November 12 21. FINDINGS: Brain: No acute infarction, hemorrhage, hydrocephalus, extra-axial collection or intracranial mass lesion. No abnormal intracranial enhancement. Vascular: Major arterial flow voids are maintained at the skull base. Skull and upper cervical spine: Increased size/conspicuity of scattered T1 hypointense metastasis in the upper cervical spine. Additionally, increased size of the clival skull metastasis, now measuring 1.4 cm. T1 hypointensity of the right occipital calvarium appears similar to prior and is concerning for osseous metastatic disease. Sinuses/Orbits: Interval development of marked enlargement of multiple extraocular muscles, including bulky enlargement of the left medial and lateral rectus muscles and right lateral rectus muscle. Additionally, there is evidence of involvement of the right medial rectus muscle anteriorly (series 17, image 26 and series 5, image 19), superior rectus musculature anteriorly bilaterally (series 17, 26; series 5, image 22) and the inferior rectus muscle on the right posteriorly (series 17, image 24; series 5, image 16). The enlarged extra-ocular muscles demonstrate T2 hypointensity and restricted diffusion, suggesting high cellularity. There is involvement of the muscle bodies as well as tendinous insertions in areas.  Other: No sizable mastoid effusions. IMPRESSION: 1. No evidence of acute infarct or intracranial brain metastasis. 2. Interval development of marked enlargement of multiple bilateral extraocular muscles, detailed above and most prominently involving the medial and lateral rectus. The enlarged extra-ocular muscles demonstrate T2 hypointensity and restricted diffusion, suggesting high cellularity. While nonspecific, this finding can be seen with orbital lymphoma. Idiopathic orbital inflammatory pseudotumor and IgG4-related orbital disease are additional differential considerations. Metastatic breast cancer was considered given the clinical history, but thought unlikely given the diffuse nature of the process. 3. Partially imaged metastases in the upper cervical spine and clival metastasis appear larger and more conspicuous, concerning for progression of bony metastatic disease. Similar T1 hypointensity right occipital calvarium, concerning for an additional site of osseous metastatic disease. Findings discussed with Dr. Jaclynn Guarneri at 5:52 p.m. via telephone. Electronically Signed: By: Margaretha Sheffield MD On: 10/25/2020 18:17   US Venous Img Lower Unilateral Right (DVT)  Result Date: 10/25/2020 CLINICAL DATA:  Initial evaluation for acute right lower extremity pain and swelling. EXAM: RIGHT LOWER EXTREMITY VENOUS DOPPLER ULTRASOUND TECHNIQUE: Gray-scale sonography with graded compression, as well as color Doppler and duplex ultrasound were performed to evaluate the lower extremity deep venous systems from the level of the common femoral vein and including the common femoral, femoral, profunda femoral, popliteal and calf veins including the posterior tibial, peroneal and gastrocnemius veins when visible. The superficial great saphenous vein was also interrogated. Spectral Doppler was utilized to evaluate flow at rest and with distal augmentation maneuvers in the  common femoral, femoral and popliteal veins. COMPARISON:   None. FINDINGS: Contralateral Common Femoral Vein: Respiratory phasicity is normal and symmetric with the symptomatic side. No evidence of thrombus. Normal compressibility. Common Femoral Vein: No evidence of thrombus. Normal compressibility, respiratory phasicity and response to augmentation. Saphenofemoral Junction: Echogenic thrombus seen at the saphenofemoral junction. Profunda Femoral Vein: No evidence of thrombus. Normal compressibility and flow on color Doppler imaging. Femoral Vein: Echogenic partially occlusive thrombus seen throughout the proximal, mid, and distal right femoral vein. Popliteal Vein: Echogenic occlusive thrombus seen throughout the right popliteal vein. Calf Veins: Echogenic occlusive thrombus seen within the right peroneal and posterior tibial veins. Superficial Great Saphenous Vein: No evidence of thrombus. Normal compressibility. Venous Reflux:  None. Other Findings:  None. IMPRESSION: Positive study with extensive acute DVT extending from the proximal right femoral vein through the veins of the calf. Electronically Signed   By: Jeannine Boga M.D.   On: 10/25/2020 18:40               LOS: 1 day   Jasma Seevers  [facility]   Pager on [email or web address]. If 7PM-7AM, please contact night-coverage at [email or web address]     10/27/2020, 2:57 PM

## 2020-10-27 NOTE — Progress Notes (Signed)
PT Cancellation Note  Patient Details Name: Jessica Willis MRN: [id number] DOB: [birth year]   Cancelled Treatment:    Reason Eval/Treat Not Completed: Patient not medically ready.  Pt's potassium noted to be elevated to 5.6 today.  Per PT guidelines for elevated potassium, will hold PT at this time and re-attempt PT evaluation at a later date/time as medically appropriate.  Leitha Bleak, PT 10/27/20, 8:53 AM

## 2020-10-28 ENCOUNTER — Inpatient Hospital Stay: Payer: Medicaid Other

## 2020-10-28 ENCOUNTER — Inpatient Hospital Stay: Payer: Medicaid Other | Admitting: Oncology

## 2020-10-28 DIAGNOSIS — Z515 Encounter for palliative care: Secondary | ICD-10-CM | POA: Diagnosis not present

## 2020-10-28 DIAGNOSIS — R630 Anorexia: Secondary | ICD-10-CM

## 2020-10-28 DIAGNOSIS — E860 Dehydration: Secondary | ICD-10-CM

## 2020-10-28 DIAGNOSIS — D638 Anemia in other chronic diseases classified elsewhere: Secondary | ICD-10-CM

## 2020-10-28 DIAGNOSIS — I82411 Acute embolism and thrombosis of right femoral vein: Secondary | ICD-10-CM

## 2020-10-28 DIAGNOSIS — R627 Adult failure to thrive: Principal | ICD-10-CM

## 2020-10-28 DIAGNOSIS — C7951 Secondary malignant neoplasm of bone: Secondary | ICD-10-CM

## 2020-10-28 DIAGNOSIS — C50919 Malignant neoplasm of unspecified site of unspecified female breast: Secondary | ICD-10-CM | POA: Diagnosis not present

## 2020-10-28 DIAGNOSIS — U071 COVID-19: Secondary | ICD-10-CM

## 2020-10-28 DIAGNOSIS — Z7189 Other specified counseling: Secondary | ICD-10-CM

## 2020-10-28 DIAGNOSIS — E43 Unspecified severe protein-calorie malnutrition: Secondary | ICD-10-CM

## 2020-10-28 LAB — CBC WITH DIFFERENTIAL/PLATELET
Abs Immature Granulocytes: 0.25 10*3/uL — ABNORMAL HIGH (ref 0.00–0.07)
Basophils Absolute: 0 10*3/uL (ref 0.0–0.1)
Basophils Relative: 0 %
Eosinophils Absolute: 0 10*3/uL (ref 0.0–0.5)
Eosinophils Relative: 0 %
HCT: 27.2 % — ABNORMAL LOW (ref 36.0–46.0)
Hemoglobin: 8.1 g/dL — ABNORMAL LOW (ref 12.0–15.0)
Immature Granulocytes: 6 %
Lymphocytes Relative: 23 %
Lymphs Abs: 1 10*3/uL (ref 0.7–4.0)
MCH: 25.6 pg — ABNORMAL LOW (ref 26.0–34.0)
MCHC: 29.8 g/dL — ABNORMAL LOW (ref 30.0–36.0)
MCV: 85.8 fL (ref 80.0–100.0)
Monocytes Absolute: 0.7 10*3/uL (ref 0.1–1.0)
Monocytes Relative: 16 %
Neutro Abs: 2.4 10*3/uL (ref 1.7–7.7)
Neutrophils Relative %: 55 %
Platelets: 242 10*3/uL (ref 150–400)
RBC: 3.17 MIL/uL — ABNORMAL LOW (ref 3.87–5.11)
RDW: 17 % — ABNORMAL HIGH (ref 11.5–15.5)
Smear Review: UNDETERMINED
WBC: 4.5 10*3/uL (ref 4.0–10.5)
nRBC: 0.7 % — ABNORMAL HIGH (ref 0.0–0.2)

## 2020-10-28 LAB — D-DIMER, QUANTITATIVE: D-Dimer, Quant: 7.93 ug/mL-FEU — ABNORMAL HIGH (ref 0.00–0.50)

## 2020-10-28 LAB — GLUCOSE, CAPILLARY
Glucose-Capillary: 92 mg/dL (ref 70–99)
Glucose-Capillary: 166 mg/dL — ABNORMAL HIGH (ref 70–99)
Glucose-Capillary: 117 mg/dL — ABNORMAL HIGH (ref 70–99)
Glucose-Capillary: 88 mg/dL (ref 70–99)

## 2020-10-28 LAB — COMPREHENSIVE METABOLIC PANEL
ALT: 17 U/L (ref 0–44)
AST: 74 U/L — ABNORMAL HIGH (ref 15–41)
Albumin: 2.2 g/dL — ABNORMAL LOW (ref 3.5–5.0)
Alkaline Phosphatase: 296 U/L — ABNORMAL HIGH (ref 38–126)
Anion gap: 9 (ref 5–15)
BUN: 27 mg/dL — ABNORMAL HIGH (ref 6–20)
CO2: 33 mmol/L — ABNORMAL HIGH (ref 22–32)
Calcium: 8 mg/dL — ABNORMAL LOW (ref 8.9–10.3)
Chloride: 103 mmol/L (ref 98–111)
Creatinine, Ser: 0.41 mg/dL — ABNORMAL LOW (ref 0.44–1.00)
GFR, Estimated: >60 mL/min (ref >60)
Glucose, Bld: 95 mg/dL (ref 70–99)
Potassium: 4.3 mmol/L (ref 3.5–5.1)
Sodium: 145 mmol/L (ref 135–145)
Total Bilirubin: 0.7 mg/dL (ref 0.3–1.2)
Total Protein: 5.3 g/dL — ABNORMAL LOW (ref 6.5–8.1)

## 2020-10-28 LAB — C-REACTIVE PROTEIN: CRP: 22.8 mg/dL — ABNORMAL HIGH (ref <1.0)

## 2020-10-28 MED ORDER — PREDNISONE 20 MG PO TABS
30.0000 mg | ORAL_TABLET | Freq: Every day | ORAL | Status: DC
  Administered 2020-10-29 – 2020-10-31 (×3): 30 mg via ORAL
  Filled 2020-10-28 (×3): qty 1

## 2020-10-28 NOTE — Consult Note (Signed)
[place]  [phone] [phone]   Name: Jessica Willis Date: 10/28/2020 MRN: [id number]  DOB: March 21, 1966  Patient Care Team: [facility] as PCP - Lester Kinsman, Linna Darner, RN as Registered Nurse Benjamine Sprague, DO as Consulting Physician (Surgery)    REASON FOR CONSULTATION: Jessica Willis is a 55 y.o. 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  CT of the chest revealed evidence of disease progression with worse adenopathy with enlarging necrotic breast mass.  Patient was rotated to single agent Adriamycin after verbalizing goal of continuing systemic chemotherapy.  Her hospitalization was complicated by hypoxic respiratory failure ultimately requiring discharge home on oxygen.  Patient is now readmitted 10/25/2020 with weakness and failure to thrive after falling at home.  Palliative care was consulted help address goals.   SOCIAL HISTORY:     reports that she has never smoked. She has never used smokeless tobacco. She reports previous alcohol use of about 1.0 standard drink of alcohol per week. She reports that she does not use drugs.  Patient is unmarried and lives at home with her boyfriend. She has two sons and a daughter who are involved in her care. Patient worked at an [employer].  ADVANCE DIRECTIVES:  Does not have  CODE STATUS: Full code  PAST MEDICAL HISTORY: Past Medical History:  Diagnosis Date  . Anemia   . Family history of breast cancer   . Patient denies medical problems     PAST SURGICAL HISTORY:  Past Surgical History:  Procedure Laterality Date  . BREAST BIOPSY  06/28/2020   Procedure: BREAST BIOPSY;  Surgeon: Benjamine Sprague, DO;  Location: [facility] ORS;  Service: General;;  .  PORTACATH PLACEMENT N/A 06/28/2020   Procedure: INSERTION PORT-A-CATH;  Surgeon: Benjamine Sprague, DO;  Location: [facility] ORS;  Service: General;  Laterality: N/A;  . TUBAL LIGATION    . VIDEO ASSISTED THORACOSCOPY (VATS)/THOROCOTOMY Right 06/23/2020   Procedure: ATTEMPTED VIDEO ASSISTED THORACOSCOPY (VATS);  Surgeon: Nestor Lewandowsky, MD;  Location: [facility] ORS;  Service: General;  Laterality: Right;    HEMATOLOGY/ONCOLOGY HISTORY:  Oncology History  Metastatic breast cancer ([place])  06/21/2020 Initial Diagnosis   Metastatic breast cancer ([place])   06/28/2020 Cancer Staging   Staging form: Breast, AJCC 8th Edition - Clinical stage from 06/28/2020: Stage IV (cT4c, cM1, G3, ER-, PR-, HER2-) - Signed by Earlie Server, MD on 08/11/2020   06/29/2020 - 09/22/2020 Chemotherapy          Genetic Testing   Negative genetic testing. No pathogenic variants identified on the [facility] Common Hereditary Cancers Panel. The report date is 07/25/2020.  The Common Hereditary Cancers Panel offered by [facility] includes sequencing and/or deletion duplication testing of the following 48 genes: APC, ATM, AXIN2, BARD1, BMPR1A, BRCA1, BRCA2, BRIP1, CDH1, CDKN2A (p14ARF), CDKN2A (p16INK4a), CKD4, CHEK2, CTNNA1, DICER1, EPCAM (Deletion/duplication testing only), GREM1 (promoter region deletion/duplication testing only), KIT, MEN1, MLH1, MSH2, MSH3, MSH6, MUTYH, NBN, NF1, NHTL1, PALB2, PDGFRA, PMS2, POLD1, POLE, PTEN, RAD50, RAD51C, RAD51D, RNF43, SDHB, SDHC, SDHD, SMAD4, SMARCA4. STK11, TP53, TSC1, TSC2, and VHL.  The following genes were evaluated for sequence changes only: SDHA and HOXB13 c.251G>A variant only.   10/14/2020 -  Chemotherapy    Patient is on Treatment Plan: BREAST DOXORUBICIN Q21D        ALLERGIES:  has No Known Allergies.  MEDICATIONS:  Current Facility-Administered Medications  Medication Dose Route Frequency Provider Last Rate Last Admin  . acetaminophen (TYLENOL) tablet 325 mg  325 mg Oral Q6H PRN Cox, Amy N,  DO       Or  . acetaminophen (TYLENOL) suppository 325 mg  325 mg Rectal Q6H PRN Cox, Amy N, DO      . albuterol (PROVENTIL) (2.5 MG/3ML) 0.083% nebulizer solution 2.5 mg  2.5 mg Nebulization Q6H PRN Cox, Amy N, DO      . albuterol (VENTOLIN HFA) 108 (90 Base) MCG/ACT inhaler 2 puff  2 puff Inhalation Q4H PRN Cox, Amy N, DO      . apixaban (ELIQUIS) tablet 10 mg  10 mg Oral BID Jennye Boroughs, MD   10 mg at 10/28/20 0946   Followed by  . [START ON 11/02/2020] apixaban (ELIQUIS) tablet 5 mg  5 mg Oral BID Jennye Boroughs, MD      . ascorbic acid (VITAMIN C) tablet 500 mg  500 mg Oral Daily Cox, Amy N, DO   500 mg at 10/28/20 0945  . calcium carbonate (OS-CAL - dosed in mg of elemental calcium) tablet 1,250 mg  1,250 mg Oral Q breakfast Cox, Amy N, DO   1,250 mg at 10/28/20 0946  . Chlorhexidine Gluconate Cloth 2 % PADS 6 each  6 each Topical Daily Jennye Boroughs, MD   6 each at 10/28/20 0957  . chlorpheniramine-HYDROcodone (TUSSIONEX) 10-8 MG/5ML suspension 5 mL  5 mL Oral Q12H PRN Cox, Amy N, DO      . dronabinol (MARINOL) capsule 5 mg  5 mg Oral BID AC Cox, Amy N, DO   5 mg at 10/27/20 1249  . feeding supplement (ENSURE ENLIVE / ENSURE PLUS) liquid 237 mL  237 mL Oral QID Jennye Boroughs, MD   237 mL at 10/28/20 1000  . guaiFENesin-dextromethorphan (ROBITUSSIN DM) 100-10 MG/5ML syrup 10 mL  10 mL Oral Q4H PRN Cox, Amy N, DO      . insulin aspart (novoLOG) injection 0-5 Units  0-5 Units Subcutaneous QHS Cox, Amy N, DO      . insulin aspart (novoLOG) injection 0-9 Units  0-9 Units Subcutaneous TID WC Cox, Amy N, DO   1 Units at 10/26/20 1634  . magic mouthwash w/lidocaine  15 mL Oral TID Cox, Amy N, DO   15 mL at 10/28/20 0946  . magnesium chloride (SLOW-MAG) 64 MG SR tablet 64 mg  1 tablet Oral Daily Cox, Amy N, DO   64 mg at 10/28/20 0945  . metoprolol tartrate (LOPRESSOR) tablet 50 mg  50 mg Oral BID Cox, Amy N, DO   50 mg at 10/28/20 0945  . morphine 4 MG/ML injection 4 mg  4 mg Intravenous Q4H  PRN Cox, Amy N, DO      . multivitamin with minerals tablet 1 tablet  1 tablet Oral Daily Cox, Amy N, DO   1 tablet at 10/28/20 0946  . ondansetron (ZOFRAN) injection 4 mg  4 mg Intravenous Q8H PRN Cox, Amy N, DO      . oxyCODONE-acetaminophen (PERCOCET) 7.5-325 MG per tablet 1 tablet  1 tablet Oral Q8H PRN Cox, Amy N, DO   1 tablet at 10/25/20 1900  . polyethylene glycol (MIRALAX / GLYCOLAX) packet 17 g  17 g Oral BID Cox, Amy N, DO   17 g at 10/28/20 0946  . remdesivir 100 mg in sodium chloride 0.9 % 100 mL IVPB  100 mg Intravenous Daily Cox, Amy N,  DO 200 mL/hr at 10/28/20 0955 100 mg at 10/28/20 0955  . zinc sulfate capsule 220 mg  220 mg Oral Daily Cox, Amy N, DO   220 mg at 10/28/20 0946    VITAL SIGNS: BP (!) 118/59 (BP Location: Right Arm)   Pulse (!) 109   Temp 98.5 F (36.9 C) (Oral)   Resp (!) 27   Ht 5' 10"  (1.778 m)   Wt 154 lb 1.6 oz (69.9 kg)   LMP 06/16/2015 Comment: Tubal Ligation   SpO2 99%   BMI 22.11 kg/m  Filed Weights   10/25/20 2100 10/26/20 0400 10/27/20 0400  Weight: 152 lb 8.9 oz (69.2 kg) 151 lb 3.8 oz (68.6 kg) 154 lb 1.6 oz (69.9 kg)    Estimated body mass index is 22.11 kg/m as calculated from the following:   Height as of this encounter: 5' 10"  (1.778 m).   Weight as of this encounter: 154 lb 1.6 oz (69.9 kg).  LABS: CBC:    Component Value Date/Time   WBC 4.5 10/28/2020 0601   HGB 8.1 (L) 10/28/2020 0601   HCT 27.2 (L) 10/28/2020 0601   PLT 242 10/28/2020 0601   MCV 85.8 10/28/2020 0601   NEUTROABS 2.4 10/28/2020 0601   LYMPHSABS 1.0 10/28/2020 0601   MONOABS 0.7 10/28/2020 0601   EOSABS 0.0 10/28/2020 0601   BASOSABS 0.0 10/28/2020 0601   Comprehensive Metabolic Panel:    Component Value Date/Time   NA 145 10/28/2020 0601   K 4.3 10/28/2020 0601   CL 103 10/28/2020 0601   CO2 33 (H) 10/28/2020 0601   BUN 27 (H) 10/28/2020 0601   CREATININE 0.41 (L) 10/28/2020 0601   GLUCOSE 95 10/28/2020 0601   CALCIUM 8.0 (L) 10/28/2020 0601    AST 74 (H) 10/28/2020 0601   ALT 17 10/28/2020 0601   ALKPHOS 296 (H) 10/28/2020 0601   BILITOT 0.7 10/28/2020 0601   PROT 5.3 (L) 10/28/2020 0601   ALBUMIN 2.2 (L) 10/28/2020 0601    RADIOGRAPHIC STUDIES: DG Chest 2 View  Result Date: 10/07/2020 CLINICAL DATA:  Hypoxia, shortness of breath, metastatic breast cancer EXAM: CHEST - 2 VIEW COMPARISON:  09/13/2020 FINDINGS: LEFT subclavian Port-A-Cath with tip projecting over RIGHT atrium. Enlargement of cardiac silhouette. Pulmonary vascular congestion. Persistent enlargement of RIGHT hilum question mass or adenopathy. Increased infiltrate of the mid to lower RIGHT lung. Small RIGHT pleural effusion again seen. Accentuation of markings in LEFT perihilar region little changed. No pneumothorax or acute osseous findings. IMPRESSION: Increased RIGHT perihilar infiltrate with persistent small RIGHT pleural effusion. Enlargement of RIGHT pulmonary hilum by adenopathy versus perihilar mass. Electronically Signed   By: Lavonia Dana M.D.   On: 10/07/2020 11:32   DG Lumbar Spine Complete  Result Date: 10/25/2020 CLINICAL DATA:  Pain following fall EXAM: LUMBAR SPINE - COMPLETE 4+ VIEW COMPARISON:  None. FINDINGS: Frontal, lateral, spot lumbosacral lateral, and bilateral oblique views were obtained. There are 5 non-rib-bearing lumbar type vertebral bodies. There is no fracture or spondylolisthesis. The disc spaces appear normal. There is no appreciable facet arthropathy. IMPRESSION: No fracture or spondylolisthesis.  No evident arthropathy. Electronically Signed   By: Lowella Grip III M.D.   On: 10/25/2020 14:08   CT Angio Chest PE W and/or Wo Contrast  Result Date: 10/07/2020 CLINICAL DATA:  Breast carcinoma. Tachycardia and shortness of breath EXAM: CT ANGIOGRAPHY CHEST WITH CONTRAST TECHNIQUE: Multidetector CT imaging of the chest was performed using the standard protocol during bolus administration of intravenous contrast. Multiplanar  CT image  reconstructions and MIPs were obtained to evaluate the vascular anatomy. CONTRAST:  33m OMNIPAQUE IOHEXOL 350 MG/ML SOLN COMPARISON:  CT angiogram chest June 15, 2020; chest radiograph October 07, 2020 FINDINGS: Cardiovascular: There is no evident pulmonary embolus. No appreciable thoracic aortic aneurysm or dissection. Visualized great vessels appear unremarkable. There is no appreciable pericardial effusion or pericardial thickening. Port-A-Cath tip is in the superior vena cava near the cavoatrial junction. Mediastinum/Nodes: Thyroid appears normal. There are multiple axillary lymph nodes bilaterally, more severe on the right than on the left. The largest lymph node is seen in the right axillary region measuring 2.7 x 2.1 cm. There is adenopathy in the right supraclavicular and infraclavicular regions with extension of adenopathy into the right Peri carinal region. Largest individual lymph node in these areas measures 2.6 x 1.8 cm. Several left supraclavicular lymph nodes are also noted, largest measuring 1.7 x 1.3 cm. There are several subcentimeter mediastinal lymph nodes. There is extensive retrocrural adenopathy. The largest lymph node or collection of matted lymph nodes is seen inferior to the aorta on the left posteriorly measuring 3.4 x 2.1 cm. No esophageal lesions are evident. Lungs/Pleura: There is extensive airspace consolidation in portions of the right middle and lower lobes with interspersed areas of loculated pleural effusion. There is ill-defined airspace opacity consistent with pneumonia in the left upper lobe. Loculated fluid tracks along the left major fissure. There may well be associated metastasis within the fissure given the extensive nodularity in this area. There is a small left pleural effusion with left base atelectasis. Pleural metastases noted along each upper hemithorax, primarily posteriorly as well as in the right apex. Upper Abdomen: Retrocrural adenopathy extends into the  upper abdominal region. Visualized upper abdominal structures otherwise appear unremarkable. Musculoskeletal: There is a large mass occupying the right breast and chest wall region with invasion of the deep muscles of the chest wall on the right. There is also invasion of the right axilla. There is extensive subcutaneous thickening in both breast regions. There is a mass in the lateral left breast with extension of soft tissue prominence along the lateral left hemithorax, likely due to extension of neoplasm into the adjacent soft tissues. There is widespread sclerotic bony metastatic disease throughout the thoracic region. Review of the MIP images confirms the above findings. IMPRESSION: 1. No demonstrable pulmonary embolus. No thoracic aortic aneurysm or dissection. 2. Areas of airspace consolidation noted in the right middle and lower lobes. More ill-defined infiltrate is noted in the left upper lobe anteriorly. Areas of loculated fluid noted in the left major fissure. Nodularity in this area could indicate superimposed masses in left major fissure. There is pleural thickening along both posterior upper hemithorax regions, likely pleural metastases. Loculated fluid noted in areas of infiltrate on the right. Small left pleural effusion noted. 3. Adenopathy at multiple sites, most severe in the retrocrural regions bilaterally, right supraclavicular region, and right axillary region, although there is left axillary and left supraclavicular adenopathy. 4. Large mass lesions occupying the right breast with extension and invasion of the chest wall and right axillary regions. A lesser degree of metastatic disease to the left of midline in the subcutaneous tissues and lateral left breast/lateral left chest wall soft tissues noted. 5. Multifocal sclerotic bony metastatic disease throughout the thoracic region noted. Electronically Signed   By: WLowella GripIII M.D.   On: 10/07/2020 13:23   MR BRAIN W WO  CONTRAST  Addendum Date: 10/26/2020   ADDENDUM REPORT: 10/26/2020  13:14 ADDENDUM: The conclusion mentions that breast cancer is thought unlikely for the orbital findings. On further review, given the patient's widespread metastatic disease and given that breast cancer does have a propensity for orbital metastasis (occasionally multifocal/bilateral), metastatic breast cancer is not excluded. The additional differential considerations mentioned in the conclusion remain possibilities, particularly orbital lymphoma given the restricted diffusion. Consider ophthalmology consultation. Findings discussed with Dr. Jennye Boroughs via telephone at 1:10 pm. Electronically Signed   By: Margaretha Sheffield MD   On: 10/26/2020 13:14   Result Date: 10/26/2020 CLINICAL DATA:  Neuro deficit, acute stroke suspected. Right leg paresthesias and discomfort. EXAM: MRI HEAD WITHOUT AND WITH CONTRAST TECHNIQUE: Multiplanar, multiecho pulse sequences of the brain and surrounding structures were obtained without and with intravenous contrast. CONTRAST:  53m GADAVIST GADOBUTROL 1 MMOL/ML IV SOLN COMPARISON:  MRI November 12 21. FINDINGS: Brain: No acute infarction, hemorrhage, hydrocephalus, extra-axial collection or intracranial mass lesion. No abnormal intracranial enhancement. Vascular: Major arterial flow voids are maintained at the skull base. Skull and upper cervical spine: Increased size/conspicuity of scattered T1 hypointense metastasis in the upper cervical spine. Additionally, increased size of the clival skull metastasis, now measuring 1.4 cm. T1 hypointensity of the right occipital calvarium appears similar to prior and is concerning for osseous metastatic disease. Sinuses/Orbits: Interval development of marked enlargement of multiple extraocular muscles, including bulky enlargement of the left medial and lateral rectus muscles and right lateral rectus muscle. Additionally, there is evidence of involvement of the right medial  rectus muscle anteriorly (series 17, image 26 and series 5, image 19), superior rectus musculature anteriorly bilaterally (series 17, 26; series 5, image 22) and the inferior rectus muscle on the right posteriorly (series 17, image 24; series 5, image 16). The enlarged extra-ocular muscles demonstrate T2 hypointensity and restricted diffusion, suggesting high cellularity. There is involvement of the muscle bodies as well as tendinous insertions in areas. Other: No sizable mastoid effusions. IMPRESSION: 1. No evidence of acute infarct or intracranial brain metastasis. 2. Interval development of marked enlargement of multiple bilateral extraocular muscles, detailed above and most prominently involving the medial and lateral rectus. The enlarged extra-ocular muscles demonstrate T2 hypointensity and restricted diffusion, suggesting high cellularity. While nonspecific, this finding can be seen with orbital lymphoma. Idiopathic orbital inflammatory pseudotumor and IgG4-related orbital disease are additional differential considerations. Metastatic breast cancer was considered given the clinical history, but thought unlikely given the diffuse nature of the process. 3. Partially imaged metastases in the upper cervical spine and clival metastasis appear larger and more conspicuous, concerning for progression of bony metastatic disease. Similar T1 hypointensity right occipital calvarium, concerning for an additional site of osseous metastatic disease. Findings discussed with Dr. SJaclynn Guarneriat 5:52 p.m. via telephone. Electronically Signed: By: FMargaretha SheffieldMD On: 10/25/2020 18:17   UKoreaVenous Img Lower Unilateral Right (DVT)  Result Date: 10/25/2020 CLINICAL DATA:  Initial evaluation for acute right lower extremity pain and swelling. EXAM: RIGHT LOWER EXTREMITY VENOUS DOPPLER ULTRASOUND TECHNIQUE: Gray-scale sonography with graded compression, as well as color Doppler and duplex ultrasound were performed to evaluate the  lower extremity deep venous systems from the level of the common femoral vein and including the common femoral, femoral, profunda femoral, popliteal and calf veins including the posterior tibial, peroneal and gastrocnemius veins when visible. The superficial great saphenous vein was also interrogated. Spectral Doppler was utilized to evaluate flow at rest and with distal augmentation maneuvers in the common femoral, femoral and popliteal veins. COMPARISON:  None. FINDINGS:  Contralateral Common Femoral Vein: Respiratory phasicity is normal and symmetric with the symptomatic side. No evidence of thrombus. Normal compressibility. Common Femoral Vein: No evidence of thrombus. Normal compressibility, respiratory phasicity and response to augmentation. Saphenofemoral Junction: Echogenic thrombus seen at the saphenofemoral junction. Profunda Femoral Vein: No evidence of thrombus. Normal compressibility and flow on color Doppler imaging. Femoral Vein: Echogenic partially occlusive thrombus seen throughout the proximal, mid, and distal right femoral vein. Popliteal Vein: Echogenic occlusive thrombus seen throughout the right popliteal vein. Calf Veins: Echogenic occlusive thrombus seen within the right peroneal and posterior tibial veins. Superficial Great Saphenous Vein: No evidence of thrombus. Normal compressibility. Venous Reflux:  None. Other Findings:  None. IMPRESSION: Positive study with extensive acute DVT extending from the proximal right femoral vein through the veins of the calf. Electronically Signed   By: Jeannine Boga M.D.   On: 10/25/2020 18:40   US Venous Img Upper Uni Right(DVT)  Result Date: 10/08/2020 CLINICAL DATA:  History of stage IV breast cancer now with right upper extremity pain and edema. Evaluate for DVT. EXAM: RIGHT UPPER EXTREMITY VENOUS DOPPLER ULTRASOUND TECHNIQUE: Gray-scale sonography with graded compression, as well as color Doppler and duplex ultrasound were performed to  evaluate the upper extremity deep venous system from the level of the subclavian vein and including the jugular, axillary, basilic, radial, ulnar and upper cephalic vein. Spectral Doppler was utilized to evaluate flow at rest and with distal augmentation maneuvers. COMPARISON:  None. FINDINGS: Contralateral Subclavian Vein: Respiratory phasicity is normal and symmetric with the symptomatic side. No evidence of thrombus. Normal compressibility. Internal Jugular Vein: No evidence of thrombus. Normal compressibility, respiratory phasicity and response to augmentation. Subclavian Vein: No evidence of thrombus. Normal compressibility, respiratory phasicity and response to augmentation. Axillary Vein: Appears patent where imaged though evaluation degraded secondary to patient's discomfort with sonographic evaluation of the axilla. Cephalic Vein: No evidence of thrombus. Normal compressibility, respiratory phasicity and response to augmentation. Basilic Vein: No evidence of thrombus. Normal compressibility, respiratory phasicity and response to augmentation. Brachial Veins: No evidence of thrombus within either of the paired brachial veins. Normal compressibility, respiratory phasicity and response to augmentation. Radial Veins: No evidence of thrombus. Normal compressibility, respiratory phasicity and response to augmentation. Ulnar Veins: No evidence of thrombus. Normal compressibility, respiratory phasicity and response to augmentation. Venous Reflux:  None visualized. Other Findings: There is a moderate amount of subcutaneous edema at the level of the right upper arm. IMPRESSION: No evidence of DVT within the right upper extremity. Electronically Signed   By: Sandi Mariscal M.D.   On: 10/08/2020 14:29   DG Chest Port 1 View  Result Date: 10/25/2020 CLINICAL DATA:  Chest pain. EXAM: PORTABLE CHEST 1 VIEW COMPARISON:  Single-view of the chest 10/15/2020 and 10/13/2020. CT chest 10/07/2020. FINDINGS: Right pleural  effusion and opacities projecting over the right chest consistent with known chest wall and intrathoracic metastatic breast carcinoma again seen. Scattered opacities in the left chest are also again seen. Aeration has improved bilaterally since the most recent exam. Small right pleural effusion is noted. The left lung is clear. Heart size is normal. IMPRESSION: Aeration in both lungs appears improved compared to the most recent study in this patient with extensive metastatic breast cancer. No new abnormality. Electronically Signed   By: Inge Rise M.D.   On: 10/25/2020 12:19   DG Chest Port 1 View  Result Date: 10/15/2020 CLINICAL DATA:  Pneumonia and pleural effusion. Metastatic breast cancer. EXAM: PORTABLE CHEST 1 VIEW COMPARISON:  10/13/2020 chest radiograph and prior studies FINDINGS: Cardiomediastinal silhouette is unchanged with RIGHT mediastinal fullness. A LEFT subclavian Port-A-Cath with tip overlying the UPPER RIGHT atrium again noted. Scattered opacities within both lungs are again noted with a RIGHT pleural effusion. There is no evidence of pneumothorax. No significant changes are identified. IMPRESSION: Unchanged appearance of the chest with scattered opacities within both lungs and RIGHT pleural effusion. Electronically Signed   By: Margarette Canada M.D.   On: 10/15/2020 11:06   DG Chest Port 1 View  Result Date: 10/13/2020 CLINICAL DATA:  Pleural effusion EXAM: PORTABLE CHEST 1 VIEW COMPARISON:  10/07/2020 FINDINGS: Unchanged positioning of left-sided Port-A-Cath. Stable cardiomediastinal contours. Persistent small right pleural effusion. Slight progression of airspace opacity throughout the right lung most pronounced within the right lung base. Similar interstitial opacities throughout the left lung. No pneumothorax. IMPRESSION: 1. Slight progression of airspace opacity throughout the right lung, most pronounced within the right lung base. 2. Persistent small right pleural effusion.  Electronically Signed   By: Davina Poke D.O.   On: 10/13/2020 14:10   ECHOCARDIOGRAM COMPLETE  Result Date: 10/10/2020    ECHOCARDIOGRAM REPORT   Patient Name:   Jessica Willis Date of Exam: 10/10/2020 Medical Rec #:  [id number]           Height:       70.0 in Accession #:    [id number]          Weight:       178.7 lb Date of Birth:  1965-10-15           BSA:          1.990 m Patient Age:    40 years            BP:           119/79 mmHg Patient Gender: F                   HR:           105 bpm. Exam Location:  [facility] Procedure: 2D Echo, Limited Color Doppler and Cardiac Doppler Indications:     R06.03 Acute Respiratory Distress  History:         Patient has no prior history of Echocardiogram examinations.                  Signs/Symptoms:Shortness of Breath. Breast cancer.  Sonographer:     Charmayne Sheer RDCS (AE) Referring Phys:  [id number] Sharen Hones Diagnosing Phys: Kathlyn Sacramento MD  Sonographer Comments: Technically challenging study due to limited acoustic windows. IMPRESSIONS  1. Left ventricular ejection fraction, by estimation, is 50 to 55%. The left ventricle has low normal function. Left ventricular endocardial border not optimally defined to evaluate regional wall motion. Left ventricular diastolic parameters are indeterminate.  2. Right ventricular systolic function is normal. The right ventricular size is normal. Tricuspid regurgitation signal is inadequate for assessing PA pressure.  3. The mitral valve is normal in structure. No evidence of mitral valve regurgitation. No evidence of mitral stenosis.  4. The aortic valve is normal in structure. Aortic valve regurgitation is not visualized. No aortic stenosis is present.  5. The inferior vena cava is normal in size with greater than 50% respiratory variability, suggesting right atrial pressure of 3 mmHg. FINDINGS  Left Ventricle: Left ventricular ejection fraction, by estimation, is 50 to 55%. The left ventricle has low normal function. Left  ventricular endocardial border not optimally defined to evaluate regional wall  motion. The left ventricular internal cavity  size was normal in size. There is no left ventricular hypertrophy. Left ventricular diastolic parameters are indeterminate. Right Ventricle: The right ventricular size is normal. No increase in right ventricular wall thickness. Right ventricular systolic function is normal. Tricuspid regurgitation signal is inadequate for assessing PA pressure. Left Atrium: Left atrial size was normal in size. Right Atrium: Right atrial size was normal in size. Pericardium: There is no evidence of pericardial effusion. Mitral Valve: The mitral valve is normal in structure. No evidence of mitral valve regurgitation. No evidence of mitral valve stenosis. MV peak gradient, 4.5 mmHg. The mean mitral valve gradient is 2.0 mmHg. Tricuspid Valve: The tricuspid valve is normal in structure. Tricuspid valve regurgitation is not demonstrated. No evidence of tricuspid stenosis. Aortic Valve: The aortic valve is normal in structure. Aortic valve regurgitation is not visualized. No aortic stenosis is present. Aortic valve mean gradient measures 4.0 mmHg. Aortic valve peak gradient measures 6.8 mmHg. Pulmonic Valve: The pulmonic valve was not well visualized. Pulmonic valve regurgitation is not visualized. No evidence of pulmonic stenosis. Aorta: The aortic root is normal in size and structure. Venous: The inferior vena cava is normal in size with greater than 50% respiratory variability, suggesting right atrial pressure of 3 mmHg. IAS/Shunts: No atrial level shunt detected by color flow Doppler.   LV Volumes (MOD) LV vol d, MOD A4C: 99.4 ml Diastology LV vol s, MOD A4C: 58.3 ml LV e' lateral:   10.30 cm/s LV SV MOD A4C:     99.4 ml LV E/e' lateral: 5.8  LEFT ATRIUM           Index LA Vol (A4C): 41.5 ml 20.86 ml/m  AORTIC VALVE AV Vmax:           130.00 cm/s AV Vmean:          93.400 cm/s AV VTI:            0.243 m AV Peak  Grad:      6.8 mmHg AV Mean Grad:      4.0 mmHg LVOT Vmax:         92.70 cm/s LVOT Vmean:        60.500 cm/s LVOT VTI:          0.151 m LVOT/AV VTI ratio: 0.62 MITRAL VALVE MV Area (PHT): 6.96 cm    SHUNTS MV Peak grad:  4.5 mmHg    Systemic VTI: 0.15 m MV Mean grad:  2.0 mmHg MV Vmax:       1.06 m/s MV Vmean:      67.9 cm/s MV Decel Time: 109 msec MV E velocity: 59.60 cm/s MV A velocity: 68.60 cm/s MV E/A ratio:  0.87 Kathlyn Sacramento MD Electronically signed by Kathlyn Sacramento MD Signature Date/Time: 10/10/2020/2:00:06 PM    Final    DG HIP UNILAT W OR W/O PELVIS 2-3 VIEWS RIGHT  Result Date: 10/25/2020 CLINICAL DATA:  History of metastatic breast cancer. Patient status post fall. Initial encounter. EXAM: DG HIP (WITH OR WITHOUT PELVIS) 2-3V RIGHT COMPARISON:  PET CT scan 07/19/2020. FINDINGS: No acute bony or joint abnormality is identified. Scattered sclerotic lesions are present in both hips and the pelvis consistent with known metastatic breast cancer. Soft tissues are negative. IMPRESSION: No acute abnormality. Scattered sclerotic lesions consistent with known metastatic breast cancer. Electronically Signed   By: Inge Rise M.D.   On: 10/25/2020 14:09    PERFORMANCE STATUS (ECOG) : 3 - Symptomatic, >50% confined to  bed  Review of Systems Unless otherwise noted, a complete review of systems is negative.  Physical Exam General: NAD Pulmonary: Unlabored, on O2 Extremities: no edema, no joint deformities Skin: Fungating breast mass noted but not visualized Neurological: Weakness but otherwise nonfocal  IMPRESSION: I met with patient today to discuss goals.  She is known to me from the clinic and her recent hospitalization.  At present, patient denies any symptomatic complaints.  She reports feeling weak and says that her right leg gave out several times when she attempted to ambulate at home.  She also reports having some numbness and tingling in the right lower extremity.  Would  recommend consideration for MRI of the lumbar spine given known bony metastatic disease.  Patient did have MRI of the brain, which could not exclude orbital metastasis.  Patient is aware of her poor prognosis.  However, today she verbalized a desire to continue to pursue treatment in an attempt to prolong life.  I have discussed with Dr. Tasia Catchings who will also see patient today.  I note the patient was made a DNR during her previous hospitalization but is now full code.  I dressed this with patient today.  She seems somewhat frustrated and said that it was "too much to think about."  PLAN: -Continue current scope of treatment -Recommend consultation with medical oncology -Will follow  Case and plan discussed with Dr. Tasia Catchings and Dr. Mal Misty   Time Total: 60 minutes  Visit consisted of counseling and education dealing with the complex and emotionally intense issues of symptom management and palliative care in the setting of serious and potentially life-threatening illness.Greater than 50%  of this time was spent counseling and coordinating care related to the above assessment and plan.  Signed by: Altha Harm, PhD, NP-C

## 2020-10-28 NOTE — Consult Note (Signed)
Hematology/Oncology Consult note [facility] [phone] [phone]  Patient Care Team: [place] as PCP - General Rico Junker, RN as Registered Nurse Benjamine Sprague, DO as Consulting Physician (Surgery)   Name of the patient: Jessica Willis  [id number]  [birth year]   Date of visit: 10/28/20 REASON FOR COSULTATION:  Metastatic breast cancer History of presenting illness-  55 y.o. female with PMH listed at below who is currently admitted due to failure to thrive, debility. Patient was discharged on 10/21/2020 due to acute respiratory failure secondary to visceral crisis from metastatic triple negative breast cancer.  Patient received Adriamycin chemotherapy on 10/14/2020. Patient also was seen by pulmonology Dr. Lanney Gins for optimizing her breathing status and was discharged on a tapering course of prednisone.  Per patient, she had a fall after discharge from the hospital.  Decreased appetite, and later found right lower extremity weakness, numbness.  No nausea vomiting fever or chills.  In the emergency room, she is on 4 L of nasal cannula with O2 saturation of 98%. In emergency room, and her blood work was remarkable for sodium of 151, lactate elevated at 4.9.  Chest x-ray showed aeration in both lungs appears improved comparing to most recent study.  No new abnormality. 10/25/2020, right lower extremity venous ultrasound showed positive for extensive acute DVT extending from the proximal right femoral vein through the vein of the calf.  Patient was started on anticoagulation with Eliquis.  She was also tested positive for COVID-19.  Patient was given IV remdesivir because of high risk of decompensation given underlying immunocompromise state. 10/25/2020 MRI brain showed no intracranial metastasis or acute infarct. Interval development of marked enlargement of multiple bilateral extraocular muscles, detailed above and most  prominently involving the medial and lateral rectus. The enlarged extra-ocular muscles demonstrate T2 hypointensity and restricted diffusion, suggesting high cellularity. While nonspecific, this finding can be seen with orbital lymphoma. Idiopathic orbital inflammatory pseudotumor and IgG4-related orbital disease are additional differential considerations metastatic breast cancer is not excluded. The additional differential considerations mentioned in the conclusion remain possibilities, particularly orbital lymphoma given the restricted diffusion. Consider ophthalmology consultation.  Patient was admitted on 10/25/2020 for failure to thrive, debility, acute DVT. I was not notified that patient is admitted.  Palliative care service was consulted today for discussion of goals of care. Patient currently is full code.  Oncology was consulted today for evaluation and management.  Patient cannot tell me whether she followed her discharge discussion to take the tapering course of prednisone or not.  Currently she is not on prednisone.  Poor appetite.  Chronic shortness of breath at baseline.   Review of Systems  Constitutional: Positive for appetite change, fatigue and unexpected weight change. Negative for chills and fever.  HENT:   Negative for hearing loss and voice change.   Eyes: Negative for eye problems.  Respiratory: Positive for shortness of breath. Negative for chest tightness and cough.   Cardiovascular: Negative for chest pain.  Gastrointestinal: Negative for abdominal distention, abdominal pain and blood in stool.  Endocrine: Negative for hot flashes.  Genitourinary: Negative for difficulty urinating and frequency.   Musculoskeletal: Negative for arthralgias.       Right lower extremity warmth, swelling, numbness.  Skin: Negative for itching and rash.  Neurological: Negative for extremity weakness.  Hematological: Negative for adenopathy.  Psychiatric/Behavioral: Negative for confusion.     No Known Allergies  Patient Active Problem List   Diagnosis Date Noted  . Pressure  injury of skin 10/27/2020  . Protein-calorie malnutrition, severe 10/26/2020  . Hypernatremia 10/26/2020  . Acute deep vein thrombosis (DVT) of right femoral vein ([place]) 10/26/2020  . Weakness 10/25/2020  . Failure to thrive in adult 10/25/2020  . Dehydration 10/25/2020  . COVID-19 virus infection 10/25/2020  . Chemotherapy-induced nausea   . Poor appetite   . Pneumonia due to infectious organism 10/07/2020  . QT prolongation 09/22/2020  . Pneumonia due to Streptococcus pyogenes ([place]) 08/22/2020  . Genetic testing 07/27/2020  . Pancreatic lesion 07/22/2020  . Swelling of lower leg 07/13/2020  . Encounter for antineoplastic chemotherapy 07/04/2020  . Complication of chemotherapy 07/04/2020  . Family history of breast cancer   . Port-A-Cath in place   . Bone metastasis ([place])   . Hypocalcemia   . Folate deficiency   . Gastroesophageal reflux disease without esophagitis   . Weakness of right arm   . Palliative care encounter   . Iron deficiency anemia   . Anemia, chronic disease   . Tachycardia   . Metastatic breast cancer ([place])   . Malignant pleural effusion   . Chest tube in place   . Goals of care, counseling/discussion   . Hemothorax on right 06/16/2020  . Acute respiratory failure with hypoxia ([place])   . Breast mass, right 06/15/2020  . Mastitis 06/15/2020  . Pleural effusion 06/15/2020  . Intestinal obstruction ([place])   . Leukocytosis   . Hypokalemia 06/24/2015  . Sepsis ([place]) 06/23/2015  . CAP (community acquired pneumonia) 06/23/2015  . Partial small bowel obstruction ([place]) 06/23/2015     Past Medical History:  Diagnosis Date  . Anemia   . Family history of breast cancer   . Patient denies medical problems      Past Surgical History:  Procedure Laterality Date  . BREAST BIOPSY  06/28/2020   Procedure: BREAST BIOPSY;  Surgeon: Benjamine Sprague, DO;  Location: [facility] ORS;   Service: General;;  . PORTACATH PLACEMENT N/A 06/28/2020   Procedure: INSERTION PORT-A-CATH;  Surgeon: Benjamine Sprague, DO;  Location: [facility] ORS;  Service: General;  Laterality: N/A;  . TUBAL LIGATION    . VIDEO ASSISTED THORACOSCOPY (VATS)/THOROCOTOMY Right 06/23/2020   Procedure: ATTEMPTED VIDEO ASSISTED THORACOSCOPY (VATS);  Surgeon: Nestor Lewandowsky, MD;  Location: [facility] ORS;  Service: General;  Laterality: Right;    Social History   Socioeconomic History  . Marital status: Single    Spouse name: Not on file  . Number of children: Not on file  . Years of education: Not on file  . Highest education level: Not on file  Occupational History  . Not on file  Tobacco Use  . Smoking status: Never Smoker  . Smokeless tobacco: Never Used  Vaping Use  . Vaping Use: Never used  Substance and Sexual Activity  . Alcohol use: Not Currently    Alcohol/week: 1.0 standard drink    Types: 1 Cans of beer per week  . Drug use: No  . Sexual activity: Not on file  Other Topics Concern  . Not on file  Social History Narrative  . Not on file   Social Determinants of Health   Financial Resource Strain: Not on file  Food Insecurity: Not on file  Transportation Needs: Not on file  Physical Activity: Not on file  Stress: Not on file  Social Connections: Not on file  Intimate Partner Violence: Not on file     Family History  Problem Relation Age of Onset  . Diabetes Other   .  Hypertension Other   . Diabetes Maternal Aunt   . Breast cancer Cousin        dx 16s  . Breast cancer Cousin        dx 34s     Current Facility-Administered Medications:  .  acetaminophen (TYLENOL) tablet 325 mg, 325 mg, Oral, Q6H PRN **OR** acetaminophen (TYLENOL) suppository 325 mg, 325 mg, Rectal, Q6H PRN, Cox, Amy N, DO .  albuterol (PROVENTIL) (2.5 MG/3ML) 0.083% nebulizer solution 2.5 mg, 2.5 mg, Nebulization, Q6H PRN, Cox, Amy N, DO .  albuterol (VENTOLIN HFA) 108 (90 Base) MCG/ACT inhaler 2 puff, 2 puff,  Inhalation, Q4H PRN, Cox, Amy N, DO .  apixaban (ELIQUIS) tablet 10 mg, 10 mg, Oral, BID, 10 mg at 10/28/20 0946 **FOLLOWED BY** [START ON 11/02/2020] apixaban (ELIQUIS) tablet 5 mg, 5 mg, Oral, BID, Jennye Boroughs, MD .  ascorbic acid (VITAMIN C) tablet 500 mg, 500 mg, Oral, Daily, Cox, Amy N, DO, 500 mg at 10/28/20 0945 .  calcium carbonate (OS-CAL - dosed in mg of elemental calcium) tablet 1,250 mg, 1,250 mg, Oral, Q breakfast, Cox, Amy N, DO, 1,250 mg at 10/28/20 0946 .  Chlorhexidine Gluconate Cloth 2 % PADS 6 each, 6 each, Topical, Daily, Jennye Boroughs, MD, 6 each at 10/28/20 0957 .  chlorpheniramine-HYDROcodone (TUSSIONEX) 10-8 MG/5ML suspension 5 mL, 5 mL, Oral, Q12H PRN, Cox, Amy N, DO .  dronabinol (MARINOL) capsule 5 mg, 5 mg, Oral, BID AC, Cox, Amy N, DO, 5 mg at 10/27/20 1249 .  feeding supplement (ENSURE ENLIVE / ENSURE PLUS) liquid 237 mL, 237 mL, Oral, QID, Jennye Boroughs, MD, 237 mL at 10/28/20 1000 .  guaiFENesin-dextromethorphan (ROBITUSSIN DM) 100-10 MG/5ML syrup 10 mL, 10 mL, Oral, Q4H PRN, Cox, Amy N, DO .  insulin aspart (novoLOG) injection 0-5 Units, 0-5 Units, Subcutaneous, QHS, Cox, Amy N, DO .  insulin aspart (novoLOG) injection 0-9 Units, 0-9 Units, Subcutaneous, TID WC, Cox, Amy N, DO, 1 Units at 10/26/20 1634 .  magic mouthwash w/lidocaine, 15 mL, Oral, TID, Cox, Amy N, DO, 15 mL at 10/28/20 0946 .  magnesium chloride (SLOW-MAG) 64 MG SR tablet 64 mg, 1 tablet, Oral, Daily, Cox, Amy N, DO, 64 mg at 10/28/20 0945 .  metoprolol tartrate (LOPRESSOR) tablet 50 mg, 50 mg, Oral, BID, Cox, Amy N, DO, 50 mg at 10/28/20 0945 .  morphine 4 MG/ML injection 4 mg, 4 mg, Intravenous, Q4H PRN, Cox, Amy N, DO .  multivitamin with minerals tablet 1 tablet, 1 tablet, Oral, Daily, Cox, Amy N, DO, 1 tablet at 10/28/20 0946 .  ondansetron (ZOFRAN) injection 4 mg, 4 mg, Intravenous, Q8H PRN, Cox, Amy N, DO .  oxyCODONE-acetaminophen (PERCOCET) 7.5-325 MG per tablet 1 tablet, 1 tablet, Oral,  Q8H PRN, Cox, Amy N, DO, 1 tablet at 10/25/20 1900 .  polyethylene glycol (MIRALAX / GLYCOLAX) packet 17 g, 17 g, Oral, BID, Cox, Amy N, DO, 17 g at 10/28/20 0946 .  [COMPLETED] remdesivir 200 mg in sodium chloride 0.9% 250 mL IVPB, 200 mg, Intravenous, Once, Stopped at 10/25/20 2130 **FOLLOWED BY** remdesivir 100 mg in sodium chloride 0.9 % 100 mL IVPB, 100 mg, Intravenous, Daily, Cox, Amy N, DO, Last Rate: 200 mL/hr at 10/28/20 0955, 100 mg at 10/28/20 0955 .  zinc sulfate capsule 220 mg, 220 mg, Oral, Daily, Cox, Amy N, DO, 220 mg at 10/28/20 0946   Physical exam:  Vitals:   10/28/20 0600 10/28/20 0800 10/28/20 1000 10/28/20 1200  BP:  119/67 125/73 Marland Kitchen)  118/59  Pulse: (!) 112 (!) 116 (!) 118 (!) 109  Resp: (!) 28 (!) 21 (!) 23 (!) 27  Temp:  99.3 F (37.4 C)  98.5 F (36.9 C)  TempSrc:  Oral  Oral  SpO2: 100% 100% 98% 99%  Weight:      Height:       Physical Exam Constitutional:      General: She is not in acute distress.    Appearance: She is ill-appearing. She is not diaphoretic.  HENT:     Head: Normocephalic and atraumatic.     Nose: Nose normal.     Mouth/Throat:     Pharynx: No oropharyngeal exudate.  Eyes:     General: No scleral icterus.    Pupils: Pupils are equal, round, and reactive to light.  Cardiovascular:     Rate and Rhythm: Normal rate and regular rhythm.     Heart sounds: No murmur heard.   Pulmonary:     Effort: Pulmonary effort is normal. No respiratory distress.     Breath sounds: No wheezing.  Abdominal:     General: There is no distension.     Palpations: Abdomen is soft.     Tenderness: There is no abdominal tenderness.  Musculoskeletal:        General: Normal range of motion.     Cervical back: Normal range of motion and neck supple.     Comments: Right lower extremity swelling and warmth. She has good strength bilateral lower extremity.  Skin:    General: Skin is warm and dry.     Findings: No erythema.  Neurological:     Mental  Status: She is alert and oriented to person, place, and time.     Cranial Nerves: No cranial nerve deficit.     Motor: No abnormal muscle tone.     Coordination: Coordination normal.  Psychiatric:        Mood and Affect: Affect normal.    Large right breast mass with skin involvement with chronic drainage.     CMP Latest Ref Rng & Units 10/28/2020  Glucose 70 - 99 mg/dL 95  BUN 6 - 20 mg/dL 27(H)  Creatinine 0.44 - 1.00 mg/dL 0.41(L)  Sodium 135 - 145 mmol/L 145  Potassium 3.5 - 5.1 mmol/L 4.3  Chloride 98 - 111 mmol/L 103  CO2 22 - 32 mmol/L 33(H)  Calcium 8.9 - 10.3 mg/dL 8.0(L)  Total Protein 6.5 - 8.1 g/dL 5.3(L)  Total Bilirubin 0.3 - 1.2 mg/dL 0.7  Alkaline Phos 38 - 126 U/L 296(H)  AST 15 - 41 U/L 74(H)  ALT 0 - 44 U/L 17   CBC Latest Ref Rng & Units 10/28/2020  WBC 4.0 - 10.5 K/uL 4.5  Hemoglobin 12.0 - 15.0 g/dL 8.1(L)  Hematocrit 36.0 - 46.0 % 27.2(L)  Platelets 150 - 400 K/uL 242    RADIOGRAPHIC STUDIES: I have personally reviewed the radiological images as listed and agreed with the findings in the report. DG Chest 2 View  Result Date: 10/07/2020 CLINICAL DATA:  Hypoxia, shortness of breath, metastatic breast cancer EXAM: CHEST - 2 VIEW COMPARISON:  09/13/2020 FINDINGS: LEFT subclavian Port-A-Cath with tip projecting over RIGHT atrium. Enlargement of cardiac silhouette. Pulmonary vascular congestion. Persistent enlargement of RIGHT hilum question mass or adenopathy. Increased infiltrate of the mid to lower RIGHT lung. Small RIGHT pleural effusion again seen. Accentuation of markings in LEFT perihilar region little changed. No pneumothorax or acute osseous findings. IMPRESSION: Increased RIGHT perihilar infiltrate with persistent  small RIGHT pleural effusion. Enlargement of RIGHT pulmonary hilum by adenopathy versus perihilar mass. Electronically Signed   By: Lavonia Dana M.D.   On: 10/07/2020 11:32   DG Lumbar Spine Complete  Result Date: 10/25/2020 CLINICAL DATA:   Pain following fall EXAM: LUMBAR SPINE - COMPLETE 4+ VIEW COMPARISON:  None. FINDINGS: Frontal, lateral, spot lumbosacral lateral, and bilateral oblique views were obtained. There are 5 non-rib-bearing lumbar type vertebral bodies. There is no fracture or spondylolisthesis. The disc spaces appear normal. There is no appreciable facet arthropathy. IMPRESSION: No fracture or spondylolisthesis.  No evident arthropathy. Electronically Signed   By: Lowella Grip III M.D.   On: 10/25/2020 14:08   CT Angio Chest PE W and/or Wo Contrast  Result Date: 10/07/2020 CLINICAL DATA:  Breast carcinoma. Tachycardia and shortness of breath EXAM: CT ANGIOGRAPHY CHEST WITH CONTRAST TECHNIQUE: Multidetector CT imaging of the chest was performed using the standard protocol during bolus administration of intravenous contrast. Multiplanar CT image reconstructions and MIPs were obtained to evaluate the vascular anatomy. CONTRAST:  64mL OMNIPAQUE IOHEXOL 350 MG/ML SOLN COMPARISON:  CT angiogram chest June 15, 2020; chest radiograph October 07, 2020 FINDINGS: Cardiovascular: There is no evident pulmonary embolus. No appreciable thoracic aortic aneurysm or dissection. Visualized great vessels appear unremarkable. There is no appreciable pericardial effusion or pericardial thickening. Port-A-Cath tip is in the superior vena cava near the cavoatrial junction. Mediastinum/Nodes: Thyroid appears normal. There are multiple axillary lymph nodes bilaterally, more severe on the right than on the left. The largest lymph node is seen in the right axillary region measuring 2.7 x 2.1 cm. There is adenopathy in the right supraclavicular and infraclavicular regions with extension of adenopathy into the right Peri carinal region. Largest individual lymph node in these areas measures 2.6 x 1.8 cm. Several left supraclavicular lymph nodes are also noted, largest measuring 1.7 x 1.3 cm. There are several subcentimeter mediastinal lymph nodes. There  is extensive retrocrural adenopathy. The largest lymph node or collection of matted lymph nodes is seen inferior to the aorta on the left posteriorly measuring 3.4 x 2.1 cm. No esophageal lesions are evident. Lungs/Pleura: There is extensive airspace consolidation in portions of the right middle and lower lobes with interspersed areas of loculated pleural effusion. There is ill-defined airspace opacity consistent with pneumonia in the left upper lobe. Loculated fluid tracks along the left major fissure. There may well be associated metastasis within the fissure given the extensive nodularity in this area. There is a small left pleural effusion with left base atelectasis. Pleural metastases noted along each upper hemithorax, primarily posteriorly as well as in the right apex. Upper Abdomen: Retrocrural adenopathy extends into the upper abdominal region. Visualized upper abdominal structures otherwise appear unremarkable. Musculoskeletal: There is a large mass occupying the right breast and chest wall region with invasion of the deep muscles of the chest wall on the right. There is also invasion of the right axilla. There is extensive subcutaneous thickening in both breast regions. There is a mass in the lateral left breast with extension of soft tissue prominence along the lateral left hemithorax, likely due to extension of neoplasm into the adjacent soft tissues. There is widespread sclerotic bony metastatic disease throughout the thoracic region. Review of the MIP images confirms the above findings. IMPRESSION: 1. No demonstrable pulmonary embolus. No thoracic aortic aneurysm or dissection. 2. Areas of airspace consolidation noted in the right middle and lower lobes. More ill-defined infiltrate is noted in the left upper lobe anteriorly. Areas  of loculated fluid noted in the left major fissure. Nodularity in this area could indicate superimposed masses in left major fissure. There is pleural thickening along both  posterior upper hemithorax regions, likely pleural metastases. Loculated fluid noted in areas of infiltrate on the right. Small left pleural effusion noted. 3. Adenopathy at multiple sites, most severe in the retrocrural regions bilaterally, right supraclavicular region, and right axillary region, although there is left axillary and left supraclavicular adenopathy. 4. Large mass lesions occupying the right breast with extension and invasion of the chest wall and right axillary regions. A lesser degree of metastatic disease to the left of midline in the subcutaneous tissues and lateral left breast/lateral left chest wall soft tissues noted. 5. Multifocal sclerotic bony metastatic disease throughout the thoracic region noted. Electronically Signed   By: Lowella Grip III M.D.   On: 10/07/2020 13:23   MR BRAIN W WO CONTRAST  Addendum Date: 10/26/2020   ADDENDUM REPORT: 10/26/2020 13:14 ADDENDUM: The conclusion mentions that breast cancer is thought unlikely for the orbital findings. On further review, given the patient's widespread metastatic disease and given that breast cancer does have a propensity for orbital metastasis (occasionally multifocal/bilateral), metastatic breast cancer is not excluded. The additional differential considerations mentioned in the conclusion remain possibilities, particularly orbital lymphoma given the restricted diffusion. Consider ophthalmology consultation. Findings discussed with Dr. Jennye Boroughs via telephone at 1:10 pm. Electronically Signed   By: Margaretha Sheffield MD   On: 10/26/2020 13:14   Result Date: 10/26/2020 CLINICAL DATA:  Neuro deficit, acute stroke suspected. Right leg paresthesias and discomfort. EXAM: MRI HEAD WITHOUT AND WITH CONTRAST TECHNIQUE: Multiplanar, multiecho pulse sequences of the brain and surrounding structures were obtained without and with intravenous contrast. CONTRAST:  10mL GADAVIST GADOBUTROL 1 MMOL/ML IV SOLN COMPARISON:  MRI November 12 21.  FINDINGS: Brain: No acute infarction, hemorrhage, hydrocephalus, extra-axial collection or intracranial mass lesion. No abnormal intracranial enhancement. Vascular: Major arterial flow voids are maintained at the skull base. Skull and upper cervical spine: Increased size/conspicuity of scattered T1 hypointense metastasis in the upper cervical spine. Additionally, increased size of the clival skull metastasis, now measuring 1.4 cm. T1 hypointensity of the right occipital calvarium appears similar to prior and is concerning for osseous metastatic disease. Sinuses/Orbits: Interval development of marked enlargement of multiple extraocular muscles, including bulky enlargement of the left medial and lateral rectus muscles and right lateral rectus muscle. Additionally, there is evidence of involvement of the right medial rectus muscle anteriorly (series 17, image 26 and series 5, image 19), superior rectus musculature anteriorly bilaterally (series 17, 26; series 5, image 22) and the inferior rectus muscle on the right posteriorly (series 17, image 24; series 5, image 16). The enlarged extra-ocular muscles demonstrate T2 hypointensity and restricted diffusion, suggesting high cellularity. There is involvement of the muscle bodies as well as tendinous insertions in areas. Other: No sizable mastoid effusions. IMPRESSION: 1. No evidence of acute infarct or intracranial brain metastasis. 2. Interval development of marked enlargement of multiple bilateral extraocular muscles, detailed above and most prominently involving the medial and lateral rectus. The enlarged extra-ocular muscles demonstrate T2 hypointensity and restricted diffusion, suggesting high cellularity. While nonspecific, this finding can be seen with orbital lymphoma. Idiopathic orbital inflammatory pseudotumor and IgG4-related orbital disease are additional differential considerations. Metastatic breast cancer was considered given the clinical history, but  thought unlikely given the diffuse nature of the process. 3. Partially imaged metastases in the upper cervical spine and clival metastasis appear larger and  more conspicuous, concerning for progression of bony metastatic disease. Similar T1 hypointensity right occipital calvarium, concerning for an additional site of osseous metastatic disease. Findings discussed with Dr. Jaclynn Guarneri at 5:52 p.m. via telephone. Electronically Signed: By: Margaretha Sheffield MD On: 10/25/2020 18:17   US Venous Img Lower Unilateral Right (DVT)  Result Date: 10/25/2020 CLINICAL DATA:  Initial evaluation for acute right lower extremity pain and swelling. EXAM: RIGHT LOWER EXTREMITY VENOUS DOPPLER ULTRASOUND TECHNIQUE: Gray-scale sonography with graded compression, as well as color Doppler and duplex ultrasound were performed to evaluate the lower extremity deep venous systems from the level of the common femoral vein and including the common femoral, femoral, profunda femoral, popliteal and calf veins including the posterior tibial, peroneal and gastrocnemius veins when visible. The superficial great saphenous vein was also interrogated. Spectral Doppler was utilized to evaluate flow at rest and with distal augmentation maneuvers in the common femoral, femoral and popliteal veins. COMPARISON:  None. FINDINGS: Contralateral Common Femoral Vein: Respiratory phasicity is normal and symmetric with the symptomatic side. No evidence of thrombus. Normal compressibility. Common Femoral Vein: No evidence of thrombus. Normal compressibility, respiratory phasicity and response to augmentation. Saphenofemoral Junction: Echogenic thrombus seen at the saphenofemoral junction. Profunda Femoral Vein: No evidence of thrombus. Normal compressibility and flow on color Doppler imaging. Femoral Vein: Echogenic partially occlusive thrombus seen throughout the proximal, mid, and distal right femoral vein. Popliteal Vein: Echogenic occlusive thrombus seen  throughout the right popliteal vein. Calf Veins: Echogenic occlusive thrombus seen within the right peroneal and posterior tibial veins. Superficial Great Saphenous Vein: No evidence of thrombus. Normal compressibility. Venous Reflux:  None. Other Findings:  None. IMPRESSION: Positive study with extensive acute DVT extending from the proximal right femoral vein through the veins of the calf. Electronically Signed   By: Jeannine Boga M.D.   On: 10/25/2020 18:40   US Venous Img Upper Uni Right(DVT)  Result Date: 10/08/2020 CLINICAL DATA:  History of stage IV breast cancer now with right upper extremity pain and edema. Evaluate for DVT. EXAM: RIGHT UPPER EXTREMITY VENOUS DOPPLER ULTRASOUND TECHNIQUE: Gray-scale sonography with graded compression, as well as color Doppler and duplex ultrasound were performed to evaluate the upper extremity deep venous system from the level of the subclavian vein and including the jugular, axillary, basilic, radial, ulnar and upper cephalic vein. Spectral Doppler was utilized to evaluate flow at rest and with distal augmentation maneuvers. COMPARISON:  None. FINDINGS: Contralateral Subclavian Vein: Respiratory phasicity is normal and symmetric with the symptomatic side. No evidence of thrombus. Normal compressibility. Internal Jugular Vein: No evidence of thrombus. Normal compressibility, respiratory phasicity and response to augmentation. Subclavian Vein: No evidence of thrombus. Normal compressibility, respiratory phasicity and response to augmentation. Axillary Vein: Appears patent where imaged though evaluation degraded secondary to patient's discomfort with sonographic evaluation of the axilla. Cephalic Vein: No evidence of thrombus. Normal compressibility, respiratory phasicity and response to augmentation. Basilic Vein: No evidence of thrombus. Normal compressibility, respiratory phasicity and response to augmentation. Brachial Veins: No evidence of thrombus within  either of the paired brachial veins. Normal compressibility, respiratory phasicity and response to augmentation. Radial Veins: No evidence of thrombus. Normal compressibility, respiratory phasicity and response to augmentation. Ulnar Veins: No evidence of thrombus. Normal compressibility, respiratory phasicity and response to augmentation. Venous Reflux:  None visualized. Other Findings: There is a moderate amount of subcutaneous edema at the level of the right upper arm. IMPRESSION: No evidence of DVT within the right upper extremity. Electronically Signed  By: Sandi Mariscal M.D.   On: 10/08/2020 14:29   DG Chest Port 1 View  Result Date: 10/25/2020 CLINICAL DATA:  Chest pain. EXAM: PORTABLE CHEST 1 VIEW COMPARISON:  Single-view of the chest 10/15/2020 and 10/13/2020. CT chest 10/07/2020. FINDINGS: Right pleural effusion and opacities projecting over the right chest consistent with known chest wall and intrathoracic metastatic breast carcinoma again seen. Scattered opacities in the left chest are also again seen. Aeration has improved bilaterally since the most recent exam. Small right pleural effusion is noted. The left lung is clear. Heart size is normal. IMPRESSION: Aeration in both lungs appears improved compared to the most recent study in this patient with extensive metastatic breast cancer. No new abnormality. Electronically Signed   By: Inge Rise M.D.   On: 10/25/2020 12:19   DG Chest Port 1 View  Result Date: 10/15/2020 CLINICAL DATA:  Pneumonia and pleural effusion. Metastatic breast cancer. EXAM: PORTABLE CHEST 1 VIEW COMPARISON:  10/13/2020 chest radiograph and prior studies FINDINGS: Cardiomediastinal silhouette is unchanged with RIGHT mediastinal fullness. A LEFT subclavian Port-A-Cath with tip overlying the UPPER RIGHT atrium again noted. Scattered opacities within both lungs are again noted with a RIGHT pleural effusion. There is no evidence of pneumothorax. No significant changes are  identified. IMPRESSION: Unchanged appearance of the chest with scattered opacities within both lungs and RIGHT pleural effusion. Electronically Signed   By: Margarette Canada M.D.   On: 10/15/2020 11:06   DG Chest Port 1 View  Result Date: 10/13/2020 CLINICAL DATA:  Pleural effusion EXAM: PORTABLE CHEST 1 VIEW COMPARISON:  10/07/2020 FINDINGS: Unchanged positioning of left-sided Port-A-Cath. Stable cardiomediastinal contours. Persistent small right pleural effusion. Slight progression of airspace opacity throughout the right lung most pronounced within the right lung base. Similar interstitial opacities throughout the left lung. No pneumothorax. IMPRESSION: 1. Slight progression of airspace opacity throughout the right lung, most pronounced within the right lung base. 2. Persistent small right pleural effusion. Electronically Signed   By: Davina Poke D.O.   On: 10/13/2020 14:10   ECHOCARDIOGRAM COMPLETE  Result Date: 10/10/2020    ECHOCARDIOGRAM REPORT   Patient Name:   JEARLENE BRIDWELL Date of Exam: 10/10/2020 Medical Rec #:  [id number]           Height:       70.0 in Accession #:    [id number]          Weight:       178.7 lb Date of Birth:  08-04-1966           BSA:          1.990 m Patient Age:    14 years            BP:           119/79 mmHg Patient Gender: F                   HR:           105 bpm. Exam Location:  [facility] Procedure: 2D Echo, Limited Color Doppler and Cardiac Doppler Indications:     R06.03 Acute Respiratory Distress  History:         Patient has no prior history of Echocardiogram examinations.                  Signs/Symptoms:Shortness of Breath. Breast cancer.  Sonographer:     Charmayne Sheer RDCS (AE) Referring Phys:  [id number] Sharen Hones Diagnosing Phys: Kathlyn Sacramento MD  Sonographer  Comments: Technically challenging study due to limited acoustic windows. IMPRESSIONS  1. Left ventricular ejection fraction, by estimation, is 50 to 55%. The left ventricle has low normal function. Left  ventricular endocardial border not optimally defined to evaluate regional wall motion. Left ventricular diastolic parameters are indeterminate.  2. Right ventricular systolic function is normal. The right ventricular size is normal. Tricuspid regurgitation signal is inadequate for assessing PA pressure.  3. The mitral valve is normal in structure. No evidence of mitral valve regurgitation. No evidence of mitral stenosis.  4. The aortic valve is normal in structure. Aortic valve regurgitation is not visualized. No aortic stenosis is present.  5. The inferior vena cava is normal in size with greater than 50% respiratory variability, suggesting right atrial pressure of 3 mmHg. FINDINGS  Left Ventricle: Left ventricular ejection fraction, by estimation, is 50 to 55%. The left ventricle has low normal function. Left ventricular endocardial border not optimally defined to evaluate regional wall motion. The left ventricular internal cavity  size was normal in size. There is no left ventricular hypertrophy. Left ventricular diastolic parameters are indeterminate. Right Ventricle: The right ventricular size is normal. No increase in right ventricular wall thickness. Right ventricular systolic function is normal. Tricuspid regurgitation signal is inadequate for assessing PA pressure. Left Atrium: Left atrial size was normal in size. Right Atrium: Right atrial size was normal in size. Pericardium: There is no evidence of pericardial effusion. Mitral Valve: The mitral valve is normal in structure. No evidence of mitral valve regurgitation. No evidence of mitral valve stenosis. MV peak gradient, 4.5 mmHg. The mean mitral valve gradient is 2.0 mmHg. Tricuspid Valve: The tricuspid valve is normal in structure. Tricuspid valve regurgitation is not demonstrated. No evidence of tricuspid stenosis. Aortic Valve: The aortic valve is normal in structure. Aortic valve regurgitation is not visualized. No aortic stenosis is present. Aortic  valve mean gradient measures 4.0 mmHg. Aortic valve peak gradient measures 6.8 mmHg. Pulmonic Valve: The pulmonic valve was not well visualized. Pulmonic valve regurgitation is not visualized. No evidence of pulmonic stenosis. Aorta: The aortic root is normal in size and structure. Venous: The inferior vena cava is normal in size with greater than 50% respiratory variability, suggesting right atrial pressure of 3 mmHg. IAS/Shunts: No atrial level shunt detected by color flow Doppler.   LV Volumes (MOD) LV vol d, MOD A4C: 99.4 ml Diastology LV vol s, MOD A4C: 58.3 ml LV e' lateral:   10.30 cm/s LV SV MOD A4C:     99.4 ml LV E/e' lateral: 5.8  LEFT ATRIUM           Index LA Vol (A4C): 41.5 ml 20.86 ml/m  AORTIC VALVE AV Vmax:           130.00 cm/s AV Vmean:          93.400 cm/s AV VTI:            0.243 m AV Peak Grad:      6.8 mmHg AV Mean Grad:      4.0 mmHg LVOT Vmax:         92.70 cm/s LVOT Vmean:        60.500 cm/s LVOT VTI:          0.151 m LVOT/AV VTI ratio: 0.62 MITRAL VALVE MV Area (PHT): 6.96 cm    SHUNTS MV Peak grad:  4.5 mmHg    Systemic VTI: 0.15 m MV Mean grad:  2.0 mmHg MV Vmax:  1.06 m/s MV Vmean:      67.9 cm/s MV Decel Time: 109 msec MV E velocity: 59.60 cm/s MV A velocity: 68.60 cm/s MV E/A ratio:  0.87 Kathlyn Sacramento MD Electronically signed by Kathlyn Sacramento MD Signature Date/Time: 10/10/2020/2:00:06 PM    Final    DG HIP UNILAT W OR W/O PELVIS 2-3 VIEWS RIGHT  Result Date: 10/25/2020 CLINICAL DATA:  History of metastatic breast cancer. Patient status post fall. Initial encounter. EXAM: DG HIP (WITH OR WITHOUT PELVIS) 2-3V RIGHT COMPARISON:  PET CT scan 07/19/2020. FINDINGS: No acute bony or joint abnormality is identified. Scattered sclerotic lesions are present in both hips and the pelvis consistent with known metastatic breast cancer. Soft tissues are negative. IMPRESSION: No acute abnormality. Scattered sclerotic lesions consistent with known metastatic breast cancer. Electronically  Signed   By: Inge Rise M.D.   On: 10/25/2020 14:09    Assessment and plan-   #Acute right lower extremity proximal DVT  agree with anticoagulation with Eliquis.  Thrombosis probably due to immobilization secondary to recent hospitalization, widely metastatic cancer, COVID-19 infection etc.  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 10/14/2020, status post salvage second line palliative chemotherapy with Adriamycin.  Overall she tolerated well. 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  Her counts are overall stable at her chronic baseline.  ANC 2.4. Prognosis is poor.  I discussed with her that life expectancy probably is going to be shorter than 6 months.  Goals of care was discussed with patient.Patient remains hopeful and desires to try additional chemotherapy treatment.  She does not feel that she is ready for comfort care and hospice.  Respiratory status appears to at least be stable comparing to when she was discharged 1 week ago.  She did report that for the few days she was at home, she had experienced some slight improvement of her breathing.  Currently on 3-4 L of nasal cannula oxygen.No plan for chemotherapy treatment at this point.  Continue supportive care. During last admission, she agreed with DNR/DNI.  Currently she is full code and I had a discussion with her about DNR/DNI.  Patient is undecided.  She wants to be kept as full code as of now.  She will let nurse know if she decides to be DNR/DNI.  #Failure to thrive, patient was discharged on a tapering course of steroid which patient is not sure if she has followed the discharge instruction.  Since admission, she has not been on steroids. Fatigue/weakness may be secondary to adrenal insufficiency due to the abrupt stop of steroids.  Appetite is also poor.  I Started her on prednisone 30 mg daily.  Slow taper.  #Severe protein calorie malnutrition.  Continue Marinol 5 mg twice daily.  Nutrition  supplementation. #Chronic respiratory failure, continue albuterol nebulizer and inhaler.  Nasal cannula oxygen.  Wean as tolerated. #Tachycardia, on beta-blocker. #PT OT evaluation.  Likely need to go to rehab. #Right breast wound,decubitus ulcers of the right buttocks -wound care #?metastatic disease to extraocular muscles -ophthalmology evaluation #Covid infection,remdesivir.   Thank you for allowing me to participate in the care of this patient.  Total face to face encounter time for this patient visit was 70 min. >50% of the time was  spent in counseling and coordination of care.    Earlie Server, MD, PhD [facility] Pager- [phone] 10/28/2020

## 2020-10-28 NOTE — Evaluation (Signed)
Occupational Therapy Evaluation Patient Details Name: Jessica Willis MRN: [id number] DOB: Feb 13, 1966 Today's Date: 10/28/2020    History of Present Illness Pt is a 55 y.o. female presenting to hospital 3/15 with R leg paresthesias and discomfort (pt fell on R side after coming home from hospital and developed symptoms after fall).  Imaging (+) extensive acute DVT R LE extending from proximal R femoral vein through veins of the calf.  Pt admitted with FTT.  (+) COVID-19.  PMH includes anemia, invasive R breast CA, on chronic O2, chemotherapy, PNA, bone metastasis, R arm weakness, tachycardia, metastatic breast CA, VATS.   Clinical Impression   Jessica Willis was seen for OT evaluation this date. Pt known from prior admissions. Pt was MOD I at home for ADLs, endorses frequent falls in last week. Pt presents to acute OT demonstrating impaired ADL performance and functional mobility 2/2 decreased safety awareness, functional strength/ROM/balance deficits, and poor activity tolerance. Pt currently requires MAX A don B socks at bed level. Initial attempts to stand x1 assist, pt unable to rise c MAX A. Second trial pt unable to achieve upright posture with MAX A x2, pt noted to be soiled and returned to bed for hygiene. Pt requires MIN A for L+R rolling, MAX A x2 for perihygiene at bed level.  Of note, pt had x3 sacral wounds on buttocks, largest located in L groin. Pt reports 10/10 pain during pericare - RN in room to assess wounds and apply foam dressing. Pt would benefit from skilled OT to address noted impairments and functional limitations (see below for any additional details) in order to maximize safety and independence while minimizing falls risk and caregiver burden. Upon hospital discharge, recommend STR to maximize pt safety and return to PLOF.     Follow Up Recommendations  SNF    Equipment Recommendations  None recommended by OT    Recommendations for Other Services       Precautions  / Restrictions Precautions Precautions: Fall Precaution Comments: Metastatic breast CA with bone metastasis; L chest port Restrictions Weight Bearing Restrictions: No      Mobility Bed Mobility Overal bed mobility: Needs Assistance Bed Mobility: Rolling;Supine to Sit;Sit to Supine Rolling: Min assist   Supine to sit: Mod assist;Max assist Sit to supine: Mod assist;Max assist   General bed mobility comments: assist for R>L LE and assist for trunk semi-supine to/from sitting edge of bed; vc's for technique; min assist for logrolling and min assist to maintain sidelying for hygiene care    Transfers Overall transfer level: Needs assistance Equipment used: 2 person hand held assist Transfers: Sit to/from Stand Sit to Stand: Max assist;+2 physical assistance         General transfer comment: pt unable to stand 1st trial with 1 assist; pt able to come to 3/4th stand on 2nd trial with max assist x2; vc's for technique    Balance Overall balance assessment: Needs assistance Sitting-balance support: Feet supported;Single extremity supported Sitting balance-Leahy Scale: Good Sitting balance - Comments: steady sitting with single UE support on bed                                   ADL either performed or assessed with clinical judgement   ADL Overall ADL's : Needs assistance/impaired  General ADL Comments: MAX A don B socks at bed level. MIN A L+R rolling, MAX A x2 for perihygiene at bed level                  Pertinent Vitals/Pain Pain Assessment: 0-10 Pain Score: 10-Worst pain ever Pain Location: peri wounds during wiping Pain Descriptors / Indicators: Burning Pain Intervention(s): Limited activity within patient's tolerance;Repositioned     Hand Dominance Right   Extremity/Trunk Assessment Upper Extremity Assessment Upper Extremity Assessment: Generalized weakness   Lower Extremity  Assessment Lower Extremity Assessment: Generalized weakness RLE Deficits / Details: at least 2+/5 AROM ankle DF/PF; at least 2+/5 hip flexion and knee flexion/extension AROM       Communication Communication Communication: No difficulties   Cognition Arousal/Alertness: Awake/alert Behavior During Therapy: Flat affect Overall Cognitive Status: Within Functional Limits for tasks assessed                                 General Comments: Pt tends to avoid eye contact   General Comments  wounds noted on pts bottom - RN notified and in room to cover    Exercises Exercises: Other exercises Other Exercises Other Exercises: Pt educated re: OT role, DME recs, d/c recs, falls prevention, ECS, education on importance of mobility for funcitonal strengthening Other Exercises: LBD, toileting, sup<>sit, rolling, sitting balance/tolerance   Shoulder Instructions      Home Living Family/patient expects to be discharged to:: Private residence Living Arrangements: Non-relatives/Friends Available Help at Discharge: Family Type of Home: House Home Access: Stairs to enter Technical brewer of Steps: 1   Home Layout: One level               Home Equipment: Bedside commode;Walker - 2 wheels          Prior Functioning/Environment Level of Independence: Independent with assistive device(s)        Comments: Household ambulator with RW as needed; recent falls; decreased activity tolerance        OT Problem List: Decreased strength;Decreased activity tolerance;Impaired balance (sitting and/or standing);Decreased safety awareness      OT Treatment/Interventions: Self-care/ADL training;Therapeutic exercise;Energy conservation;DME and/or AE instruction;Therapeutic activities;Patient/family education;Balance training    OT Goals(Current goals can be found in the care plan section) Acute Rehab OT Goals Patient Stated Goal: To return home OT Goal Formulation: With  patient Time For Goal Achievement: 11/11/20 Potential to Achieve Goals: Good ADL Goals Pt Will Perform Grooming: with modified independence;sitting Pt Will Perform Lower Body Dressing: with min assist;sitting/lateral leans Pt Will Transfer to Toilet: with min assist;bedside commode;stand pivot transfer (c LRAD PRN)  OT Frequency: Min 1X/week   Barriers to D/C: Decreased caregiver support          Co-evaluation PT/OT/SLP Co-Evaluation/Treatment: Yes Reason for Co-Treatment: Complexity of the patient's impairments (multi-system involvement);For patient/therapist safety;To address functional/ADL transfers PT goals addressed during session: Mobility/safety with mobility;Balance OT goals addressed during session: ADL's and self-care      AM-PAC OT "6 Clicks" Daily Activity     Outcome Measure Help from another person eating meals?: None Help from another person taking care of personal grooming?: A Little Help from another person toileting, which includes using toliet, bedpan, or urinal?: A Lot Help from another person bathing (including washing, rinsing, drying)?: A Lot Help from another person to put on and taking off regular upper body clothing?: A Little Help from another person to put on and  taking off regular lower body clothing?: A Lot 6 Click Score: 16   End of Session Equipment Utilized During Treatment: Gait belt;Oxygen Nurse Communication: Mobility status  Activity Tolerance: Patient tolerated treatment well Patient left: in bed;with call bell/phone within reach;with bed alarm set  OT Visit Diagnosis: Unsteadiness on feet (R26.81);Repeated falls (R29.6);Muscle weakness (generalized) (M62.81)                Time: 1424-1510 OT Time Calculation (min): 46 min Charges:  OT General Charges $OT Visit: 1 Visit OT Evaluation $OT Eval Moderate Complexity: 1 Mod OT Treatments $Self Care/Home Management : 8-22 mins  Dessie Coma, M.S. OTR/L  10/28/20, 4:39 PM  ascom  [phone]

## 2020-10-28 NOTE — Evaluation (Addendum)
Physical Therapy Evaluation Patient Details Name: Jessica Willis MRN: [id number] DOB: 09-01-1965 Today's Date: 10/28/2020   History of Present Illness  Pt is a 55 y.o. female presenting to hospital 3/15 with R leg paresthesias and discomfort (pt fell on R side after coming home from hospital and developed symptoms after fall).  Imaging (+) extensive acute DVT R LE extending from proximal R femoral vein through veins of the calf.  Pt admitted with FTT.  (+) COVID-19.  PMH includes anemia, invasive R breast CA, on chronic O2, chemotherapy, PNA, bone metastasis, R arm weakness, tachycardia, metastatic breast CA, VATS.  Clinical Impression  Pt seen for PT/OT co-evaluation.  Pt resting in bed upon therapy arrival and noted to be incontinent of bowel.  Pt agreeable to trying to get to [facility] for toileting.  Mod to max assist for bed mobility (supine to/from sitting); pt unable to stand with 1 assist and able to achieve 3/4th stand on 2nd stand with max assist x2 (deferred transfer to [facility] d/t pt's weakness and significant assist levels so pt assisted back to bed for clean-up).  Pt requiring min assist for logrolling in bed and min assist to maintain log roll for peri-care (+2 assist).  Wounds noted during peri-care and nurse notified and came to address wounds.  R LE noted to be weaker compared to L LE.  Pt would benefit from skilled PT to address noted impairments and functional limitations (see below for any additional details).  Upon hospital discharge, pt would benefit from STR.    Follow Up Recommendations SNF    Equipment Recommendations  Rolling walker with 5" wheels;3in1 (PT);Wheelchair (measurements PT);Wheelchair cushion (measurements PT)    Recommendations for Other Services       Precautions / Restrictions Precautions Precautions: Fall Precaution Comments: Metastatic breast CA with bone metastasis; L chest port Restrictions Weight Bearing Restrictions: No      Mobility  Bed  Mobility Overal bed mobility: Needs Assistance Bed Mobility: Rolling;Supine to Sit;Sit to Supine Rolling: Min assist   Supine to sit: Mod assist;Max assist Sit to supine: Mod assist;Max assist   General bed mobility comments: assist for R>L LE and assist for trunk semi-supine to/from sitting edge of bed; vc's for technique; min assist for logrolling and min assist to maintain sidelying for hygiene care    Transfers Overall transfer level: Needs assistance Equipment used: 2 person hand held assist Transfers: Sit to/from Stand Sit to Stand: Max assist;+2 physical assistance         General transfer comment: pt unable to stand 1st trial with 1 assist; pt able to come to 3/4th stand on 2nd trial with max assist x2; vc's for technique  Ambulation/Gait             General Gait Details: unable to fully stand to attempt  Stairs            Wheelchair Mobility    Modified Rankin (Stroke Patients Only)       Balance Overall balance assessment: Needs assistance Sitting-balance support: Feet supported;Single extremity supported Sitting balance-Leahy Scale: Good Sitting balance - Comments: steady sitting with single UE support on bed                                     Pertinent Vitals/Pain Pain Assessment: No/denies pain  10/10 pain on pt's "bottom" during peri-care but 0/10 pain at rest end of session. HR 103-104 bpm  at rest and O2 sats 94% or greater on 4 L O2 via nasal cannula beginning/end of session.    Home Living Family/patient expects to be discharged to:: Private residence Living Arrangements: Non-relatives/Friends Available Help at Discharge: Family (2 sons and daughter available; work 2nd shift) Type of Home: House Home Access: Stairs to enter   Technical brewer of Steps: 1 [place]: One level [place]: Bedside commode;Walker - 2 wheels      Prior Function Level of Independence: Independent with assistive device(s)          Comments: Household ambulator with RW as needed; recent falls; decreased activity tolerance     Hand Dominance   Dominant Hand: Right    Extremity/Trunk Assessment   Upper Extremity Assessment Upper Extremity Assessment: Generalized weakness    Lower Extremity Assessment Lower Extremity Assessment: Generalized weakness;RLE deficits/detail RLE Deficits / Details: at least 2+/5 AROM ankle DF/PF; at least 2+/5 hip flexion and knee flexion/extension AROM       Communication   Communication: No difficulties  Cognition Arousal/Alertness: Awake/alert Behavior During Therapy: Flat affect Overall Cognitive Status: Within Functional Limits for tasks assessed                                 General Comments: Pt tends to avoid eye contact      General Comments General comments (skin integrity, edema, etc.): wounds noted on pt's bottom during peri-care.  Pt agreeable to PT/OT session.    Exercises     Assessment/Plan    PT Assessment Patient needs continued PT services  PT Problem List Decreased strength;Decreased activity tolerance;Decreased balance;Decreased mobility;Decreased knowledge of use of DME;Decreased knowledge of precautions;Pain;Decreased skin integrity       PT Treatment Interventions DME instruction;Gait training;Functional mobility training;Therapeutic activities;Therapeutic exercise;Balance training;Stair training;Patient/family education    PT Goals (Current goals can be found in the Care Plan section)  Acute Rehab PT Goals Patient Stated Goal: To return home PT Goal Formulation: With patient Time For Goal Achievement: 11/11/20 Potential to Achieve Goals: Fair    Frequency Min 2X/week   Barriers to discharge Decreased caregiver support      Co-evaluation PT/OT/SLP Co-Evaluation/Treatment: Yes Reason for Co-Treatment: Complexity of the patient's impairments (multi-system involvement);For patient/therapist safety;To address  functional/ADL transfers PT goals addressed during session: Mobility/safety with mobility;Balance OT goals addressed during session: ADL's and self-care       AM-PAC PT "6 Clicks" Mobility  Outcome Measure Help needed turning from your back to your side while in a flat bed without using bedrails?: A Little Help needed moving from lying on your back to sitting on the side of a flat bed without using bedrails?: A Lot Help needed moving to and from a bed to a chair (including a wheelchair)?: Total Help needed standing up from a chair using your arms (e.g., wheelchair or bedside chair)?: Total Help needed to walk in hospital room?: Total Help needed climbing 3-5 steps with a railing? : Total 6 Click Score: 9    End of Session Equipment Utilized During Treatment: Gait belt Activity Tolerance: Patient limited by pain Patient left: in bed;with call bell/phone within reach;with bed alarm set;Other (comment) (B heels floating via pillow support) Nurse Communication: Mobility status;Precautions;Other (comment) (pt's skin concerns) PT Visit Diagnosis: Other abnormalities of gait and mobility (R26.89);Muscle weakness (generalized) (M62.81);History of falling (Z91.81);Difficulty in walking, not elsewhere classified (R26.2)    Time: 1424-1510 PT Time Calculation (min) (ACUTE  ONLY): 46 min   Charges:   PT Evaluation $PT Eval Low Complexity: 1 Low PT Treatments $Therapeutic Activity: 8-22 mins       Leitha Bleak, PT 10/28/20, 3:54 PM

## 2020-10-28 NOTE — Progress Notes (Addendum)
Progress Note    Jessica Willis  WFU:[id number] DOB: [birth year]  DOA: 10/25/2020 PCP: [facility]      Brief Narrative:    Medical records reviewed and are as summarized below:  Jessica Willis is a 55 y.o. 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  She was eventually discharged home on home oxygen for chronic hypoxic respiratory failure.  She presented to the hospital again on 10/25/2020 because of poor oral intake, generalized weakness and frequent falls at home since discharge from the hospital.  She also complained of swelling and tingling in the right lower extremity.  She was found to have hypernatremia, hypokalemia and right lower extremity DVT.  She was treated with IV fluids hypokalemia was repleted.  She was started on Eliquis for treatment of DVT.      Assessment/Plan:   Principal Problem:   Failure to thrive in adult Active Problems:   Breast mass, right   Metastatic breast cancer (HCC)   Anemia, chronic disease   Iron deficiency anemia   Bone metastasis (HCC)   Port-A-Cath in place   Chemotherapy-induced nausea   Poor appetite   Weakness   Dehydration   COVID-19 virus infection   Protein-calorie malnutrition, severe   Hypernatremia   Acute deep vein thrombosis (DVT) of right femoral vein (HCC)   Pressure injury of skin   Nutrition Problem: Severe Malnutrition Etiology: acute illness (HCAP, COVID 19)  Signs/Symptoms: moderate fat depletion,moderate muscle depletion,severe muscle depletion,percent weight loss Percent weight loss: 12  %   Body mass index is 22.11 kg/m.    Hypernatremia, dehydration: Improved.  Discontinue IV fluids.  Acute right femoral DVT: Continue Eliquis  Metastatic triple negative right breast cancer with high tumor burden: Chemotherapy was initiated on 3/4/2022but progressed after first-line palliative chemotherapy with Taxol. Outpatient follow-up with ophthalmologist recommended for evaluation of possible metastatic disease to extraocular muscles..  Appreciate input from oncologist and palliative care team  Asymptomatic COVID-19 infection: No pneumonia on chest x-ray.  No evidence of sepsis.  She likely has SIRS from dehydration.  Continue IV remdesivir.  Chronic hypoxic respiratory failure: Continue 3 L/min oxygen via nasal cannula.  Severe protein calorie malnutrition, failure to thrive: Encourage use of dietary supplements and adequate oral intake.  Follow-up with dietitian.  Generalized weakness: PT recommended discharge to SNF.  Hypokalemia and hyperkalemia: Resolved  Stage II decubitus ulcers of the right buttocks (present on admission): Continue local wound care.  Patient encouraged to turn frequently.  Recent discharge from the hospital on 10/21/2020 after hospitalization for sepsis secondary to pneumonia complicated by acute hypoxic respiratory failure.  Prognosis is poor.  She is to proceed with full code for now.       Diet Order            Diet full liquid Room service appropriate? Yes; Fluid consistency: Thin  Diet effective now                    Consultants:  Oncologist  Palliative care team  Procedures:  None    Medications:   . apixaban  10 mg Oral BID   Followed by  . [START ON 11/02/2020] apixaban  5 mg Oral BID  . vitamin C  500 mg Oral Daily  . calcium carbonate  1,250 mg Oral Q breakfast  . Chlorhexidine Gluconate Cloth  6 each Topical Daily  . dronabinol  5 mg Oral BID AC  . feeding supplement  237 mL Oral QID  . insulin aspart  0-5  Units Subcutaneous QHS  . insulin aspart  0-9 Units Subcutaneous TID WC  . magic mouthwash w/lidocaine  15 mL Oral TID  . magnesium chloride  1 tablet Oral Daily  . metoprolol tartrate  50 mg Oral BID  . multivitamin with minerals  1 tablet Oral Daily  . polyethylene glycol  17 g Oral BID  . [START ON 10/29/2020] predniSONE  30 mg Oral Q breakfast  . zinc sulfate  220 mg Oral Daily   Continuous Infusions: . remdesivir 100 mg in NS 100 mL 100 mg (10/28/20 0955)     Anti-infectives (From admission, onward)   Start     Dose/Rate Route Frequency Ordered Stop   10/26/20 1000  remdesivir 100 mg in sodium chloride 0.9 % 100 mL IVPB       "Followed by" Linked Group Details   100 mg 200 mL/hr over 30 Minutes Intravenous Daily 10/25/20 1648 10/30/20 0959   10/25/20 1800  remdesivir 200 mg in sodium chloride 0.9% 250 mL IVPB       "Followed by" Linked Group Details   200 mg 580 mL/hr over 30 Minutes Intravenous Once 10/25/20 1648 10/25/20 2130             Family Communication/Anticipated D/C date and plan/Code Status   DVT prophylaxis: Place TED hose Start: 10/25/20 1530 apixaban (ELIQUIS) tablet 10 mg  apixaban (ELIQUIS) tablet 5 mg     Code Status: Full Code  Family Communication: None Disposition Plan:    Status is: Inpatient  Remains inpatient appropriate because:IV treatments appropriate due to intensity of illness or inability to take PO and Inpatient level of care appropriate due to severity of illness   Dispo: The patient is from: Home              Anticipated d/c is to: Home              Patient currently is not medically stable to d/c.   Difficult to place patient No           Subjective:   Interval events noted.  No new complaints.  Her legs are still swollen.  Objective:    Vitals:   10/28/20 0600 10/28/20 0800 10/28/20 1000 10/28/20 1200  BP:  119/67 125/73 (!) 118/59  Pulse: (!) 112 (!) 116 (!) 118 (!) 109  Resp: (!) 28 (!) 21 (!) 23 (!)  27  Temp:  99.3 F (37.4 C)  98.5 F (36.9 C)  TempSrc:  Oral  Oral  SpO2: 100% 100% 98% 99%  Weight:      Height:       No data found.   Intake/Output Summary (Last 24 hours) at 10/28/2020 1605 Last data filed at 10/28/2020 5681 Gross per 24 hour  Intake --  Output 350 ml  Net -350 ml   Filed Weights   10/25/20 2100 10/26/20 0400 10/27/20 0400  Weight: 69.2 kg 68.6 kg 69.9 kg    Exam:  GEN: NAD SKIN: Fungating multinodular mass on right breast.  Stage II decubitus ulcers on left and right buttocks. EYES: No pallor or icterus ENT: MMM CV: RRR, tachycardic PULM: CTA B ABD:  soft, ND, NT, +BS CNS: AAO x 3, non focal EXT: Bilateral lower extremity edema.  No tenderness or erythema      Pressure Injury 10/25/20 Buttocks Left;Right Stage 2 -  Partial thickness loss of dermis presenting as a shallow open injury with a red, pink wound bed without slough. (Active)  10/25/20 2100  Location: Buttocks  Location Orientation: Left;Right  Staging: Stage 2 -  Partial thickness loss of dermis presenting as a shallow open injury with a red, pink wound bed without slough.  Wound Description (Comments):   Present on Admission: Yes     Data Reviewed:   I have personally reviewed following labs and imaging studies:  Labs: Labs show the following:   Basic Metabolic Panel: Recent Labs  Lab 10/25/20 1130 10/26/20 0504 10/27/20 0611 10/28/20 0601  NA 151* 152* 144 145  K 3.2* 4.0 5.6* 4.3  CL 105 108 102 103  CO2 33* 33* 31 33*  GLUCOSE 193* 131* 94 95  BUN 44* 40* 33* 27*  CREATININE 0.62 0.47 0.41* 0.41*  CALCIUM 8.7* 9.0 9.3 8.0*  MG 2.9*  --  2.1  --   PHOS 2.3*  --  3.4  --    GFR Estimated Creatinine Clearance: 84.9 mL/min (A) (by C-G formula based on SCr of 0.41 mg/dL (L)). Liver Function Tests: Recent Labs  Lab 10/25/20 1130 10/26/20 0504 10/27/20 0611 10/28/20 0601  AST 86* 63* 64* 74*  ALT 24 17 18 17   ALKPHOS 300* 272* 288* 296*  BILITOT 0.8 0.7  0.7 0.7  PROT 6.8 5.7* 5.8* 5.3*  ALBUMIN 2.9* 2.5* 2.4* 2.2*   No results for input(s): LIPASE, AMYLASE in the last 168 hours. No results for input(s): AMMONIA in the last 168 hours. Coagulation profile Recent Labs  Lab 10/25/20 1130  INR 1.8*    CBC: Recent Labs  Lab 10/25/20 1130 10/26/20 0504 10/27/20 0611 10/28/20 0601  WBC 5.0 4.7 2.1* 4.5  NEUTROABS 3.8 3.4 1.3* 2.4  HGB 9.3* 7.9* 8.7* 8.1*  HCT 33.2* 28.6* 30.7* 27.2*  MCV 87.8 89.1 89.0 85.8  PLT 195 165 196 242   Cardiac Enzymes: Recent Labs  Lab 10/25/20 1915  CKTOTAL 44   BNP (last 3 results) No results for input(s): PROBNP in the last 8760 hours. CBG: Recent Labs  Lab 10/27/20 1204 10/27/20 1642 10/27/20 2104 10/28/20 0819 10/28/20 1237  GLUCAP 101* 100* 96 92 166*   D-Dimer: Recent Labs    10/27/20 0611 10/28/20 0601  DDIMER 13.89* 7.93*   Hgb A1c: No results for input(s): HGBA1C in the last 72 hours. Lipid Profile: No results for input(s): CHOL, HDL, LDLCALC, TRIG, CHOLHDL, LDLDIRECT in the last 72 hours. Thyroid function studies: No results for input(s): TSH, T4TOTAL, T3FREE, THYROIDAB in the last 72 hours.  Invalid input(s): FREET3 Anemia work up: No results for input(s): VITAMINB12, FOLATE, FERRITIN, TIBC, IRON, RETICCTPCT in the last 72 hours. Sepsis Labs: Recent Labs  Lab 10/25/20 1130 10/25/20 1450 10/25/20 1915 10/26/20 0504 10/27/20 0611 10/28/20 0601  PROCALCITON  --   --  73.80  --   --   --   WBC 5.0  --   --  4.7 2.1* 4.5  LATICACIDVEN 4.9* 4.2*  --   --   --   --     Microbiology Recent Results (from the past 240 hour(s))  Culture, blood (Routine x 2)     Status: None (Preliminary result)   Collection Time: 10/25/20  2:49 PM   Specimen:  BLOOD  Result Value Ref Range Status   Specimen Description BLOOD BLOOD LEFT HAND  Final   Special Requests   Final    BOTTLES DRAWN AEROBIC AND ANAEROBIC Blood Culture results may not be optimal due to an inadequate volume  of blood received in culture bottles   Culture   Final    NO GROWTH 3 DAYS Performed at [facility], [address] [place], [place] [address]    Report Status PENDING  Incomplete  Culture, blood (Routine x 2)     Status: None (Preliminary result)   Collection Time: 10/25/20  3:29 PM   Specimen: BLOOD  Result Value Ref Range Status   Specimen Description BLOOD BLOOD LEFT HAND  Final   Special Requests   Final    BOTTLES DRAWN AEROBIC AND ANAEROBIC Blood Culture adequate volume   Culture   Final    NO GROWTH 3 DAYS Performed at [facility], [address], [place], [place] [address]    Report Status PENDING  Incomplete  Resp Panel by RT-PCR (Flu A&B, Covid) Nasopharyngeal Swab     Status: Abnormal   Collection Time: 10/25/20  3:30 PM   Specimen: Nasopharyngeal Swab; Nasopharyngeal(NP) swabs in vial transport medium  Result Value Ref Range Status   SARS Coronavirus 2 by RT PCR POSITIVE (A) NEGATIVE Final    Comment: RESULT CALLED TO, READ BACK BY AND VERIFIED WITH: laura hernandez 10/25/20 at 1641 by acr (NOTE) SARS-CoV-2 target nucleic acids are DETECTED.  The SARS-CoV-2 RNA is generally detectable in upper respiratory specimens during the acute phase of infection. Positive results are indicative of the presence of the identified virus, but do not rule out bacterial infection or co-infection with other pathogens not detected by the test. Clinical correlation with patient history and other diagnostic information is necessary to determine patient infection status. The expected result is Negative.  Fact Sheet for Patients: EntrepreneurPulse.com.au  Fact Sheet for Healthcare Providers: IncredibleEmployment.be  This test is not yet approved or cleared by the [place] FDA and  has been authorized for detection and/or diagnosis of SARS-CoV-2 by FDA under an Emergency Use Authorization (EUA).  This EUA will remain in  effect (meaning this test c an be used) for the duration of  the COVID-19 declaration under Section 564(b)(1) of the Act, 21 U.S.C. section 360bbb-3(b)(1), unless the authorization is terminated or revoked sooner.     Influenza A by PCR NEGATIVE NEGATIVE Final   Influenza B by PCR NEGATIVE NEGATIVE Final    Comment: (NOTE) The Xpert Xpress SARS-CoV-2/FLU/RSV plus assay is intended as an aid in the diagnosis of influenza from Nasopharyngeal swab specimens and should not be used as a sole basis for treatment. Nasal washings and aspirates are unacceptable for Xpert Xpress SARS-CoV-2/FLU/RSV testing.  Fact Sheet for Patients: EntrepreneurPulse.com.au  Fact Sheet for Healthcare Providers: IncredibleEmployment.be  This test is not yet approved or cleared by the [place] FDA and has been authorized for detection and/or diagnosis of SARS-CoV-2 by FDA under an Emergency Use Authorization (EUA). This EUA will remain in effect (meaning this test can be used) for the duration of the COVID-19 declaration under Section 564(b)(1) of the Act, 21 U.S.C. section 360bbb-3(b)(1), unless the authorization is terminated or revoked.  Performed at [facility], [address] [place], [place] [address]     Procedures and diagnostic studies:  No results found.             LOS: 2 days  Avelina Mcclurkin  [employer], advertising on [email or web address]. If 7PM-7AM, please contact night-coverage at [email or web address]     10/28/2020, 4:05 PM

## 2020-10-29 DIAGNOSIS — R627 Adult failure to thrive: Secondary | ICD-10-CM | POA: Diagnosis not present

## 2020-10-29 DIAGNOSIS — U071 COVID-19: Secondary | ICD-10-CM | POA: Diagnosis not present

## 2020-10-29 DIAGNOSIS — I82411 Acute embolism and thrombosis of right femoral vein: Secondary | ICD-10-CM | POA: Diagnosis not present

## 2020-10-29 DIAGNOSIS — D638 Anemia in other chronic diseases classified elsewhere: Secondary | ICD-10-CM | POA: Diagnosis not present

## 2020-10-29 LAB — COMPREHENSIVE METABOLIC PANEL
ALT: 17 U/L (ref 0–44)
AST: 69 U/L — ABNORMAL HIGH (ref 15–41)
Albumin: 2 g/dL — ABNORMAL LOW (ref 3.5–5.0)
Alkaline Phosphatase: 285 U/L — ABNORMAL HIGH (ref 38–126)
Anion gap: 7 (ref 5–15)
BUN: 23 mg/dL — ABNORMAL HIGH (ref 6–20)
CO2: 34 mmol/L — ABNORMAL HIGH (ref 22–32)
Calcium: 7.7 mg/dL — ABNORMAL LOW (ref 8.9–10.3)
Chloride: 104 mmol/L (ref 98–111)
Creatinine, Ser: 0.38 mg/dL — ABNORMAL LOW (ref 0.44–1.00)
GFR, Estimated: >60 mL/min (ref >60)
Glucose, Bld: 90 mg/dL (ref 70–99)
Potassium: 3.9 mmol/L (ref 3.5–5.1)
Sodium: 145 mmol/L (ref 135–145)
Total Bilirubin: 0.5 mg/dL (ref 0.3–1.2)
Total Protein: 5 g/dL — ABNORMAL LOW (ref 6.5–8.1)

## 2020-10-29 LAB — CBC WITH DIFFERENTIAL/PLATELET
Abs Immature Granulocytes: 0.32 10*3/uL — ABNORMAL HIGH (ref 0.00–0.07)
Basophils Absolute: 0 10*3/uL (ref 0.0–0.1)
Basophils Relative: 0 %
Eosinophils Absolute: 0 10*3/uL (ref 0.0–0.5)
Eosinophils Relative: 0 %
HCT: 24.9 % — ABNORMAL LOW (ref 36.0–46.0)
Hemoglobin: 7.2 g/dL — ABNORMAL LOW (ref 12.0–15.0)
Immature Granulocytes: 7 %
Lymphocytes Relative: 14 %
Lymphs Abs: 0.7 10*3/uL (ref 0.7–4.0)
MCH: 25 pg — ABNORMAL LOW (ref 26.0–34.0)
MCHC: 28.9 g/dL — ABNORMAL LOW (ref 30.0–36.0)
MCV: 86.5 fL (ref 80.0–100.0)
Monocytes Absolute: 0.8 10*3/uL (ref 0.1–1.0)
Monocytes Relative: 17 %
Neutro Abs: 2.9 10*3/uL (ref 1.7–7.7)
Neutrophils Relative %: 62 %
Platelets: 243 10*3/uL (ref 150–400)
RBC: 2.88 MIL/uL — ABNORMAL LOW (ref 3.87–5.11)
RDW: 17.1 % — ABNORMAL HIGH (ref 11.5–15.5)
Smear Review: NORMAL
WBC: 4.7 10*3/uL (ref 4.0–10.5)
nRBC: 1.1 % — ABNORMAL HIGH (ref 0.0–0.2)

## 2020-10-29 LAB — C-REACTIVE PROTEIN: CRP: 21.7 mg/dL — ABNORMAL HIGH (ref <1.0)

## 2020-10-29 LAB — GLUCOSE, CAPILLARY
Glucose-Capillary: 101 mg/dL — ABNORMAL HIGH (ref 70–99)
Glucose-Capillary: 124 mg/dL — ABNORMAL HIGH (ref 70–99)
Glucose-Capillary: 128 mg/dL — ABNORMAL HIGH (ref 70–99)
Glucose-Capillary: 131 mg/dL — ABNORMAL HIGH (ref 70–99)

## 2020-10-29 LAB — D-DIMER, QUANTITATIVE: D-Dimer, Quant: 4.84 ug/mL-FEU — ABNORMAL HIGH (ref 0.00–0.50)

## 2020-10-29 LAB — ABO/RH: ABO/RH(D): O NEG

## 2020-10-29 LAB — PREPARE RBC (CROSSMATCH)

## 2020-10-29 MED ORDER — SODIUM CHLORIDE 0.9% IV SOLUTION: Freq: Once | INTRAVENOUS | Status: AC

## 2020-10-29 NOTE — Progress Notes (Signed)
Progress Note    Jessica Willis  UJW:[id number] DOB: 1965-10-19  DOA: 10/25/2020 PCP: [facility]      Brief Narrative:    Medical records reviewed and are as summarized below:  Jessica Willis is a 55 y.o. 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  She was eventually discharged home on home oxygen for chronic hypoxic respiratory failure.  She presented to the hospital again on 10/25/2020 because of poor oral intake, generalized weakness and frequent falls at home since discharge from the hospital.  She also complained of swelling and tingling in the right lower extremity.  She was found to have hypernatremia, hypokalemia and right lower extremity DVT.  She was treated with IV fluids hypokalemia was repleted.  She was started on Eliquis for treatment of DVT.      Assessment/Plan:   Principal Problem:   Failure to thrive in adult Active Problems:   Breast mass, right   Metastatic breast cancer (HCC)   Anemia, chronic disease   Iron deficiency anemia   Bone metastasis (HCC)   Port-A-Cath in place   Chemotherapy-induced nausea   Poor appetite   Weakness   Dehydration   COVID-19 virus infection   Protein-calorie malnutrition, severe   Hypernatremia   Acute deep vein thrombosis (DVT) of right femoral vein (HCC)   Pressure injury of skin   Nutrition Problem: Severe Malnutrition Etiology: acute illness (HCAP, COVID 19)  Signs/Symptoms: moderate fat depletion,moderate muscle depletion,severe muscle depletion,percent weight loss Percent weight loss: 12  %   Body mass index is 21.94 kg/m.    Hypernatremia, dehydration: Improved  Acute right femoral DVT: Continue Eliquis  Metastatic triple negative right breast cancer with high tumor burden: Chemotherapy was initiated on 3/4/2022but progressed after first-line palliative chemotherapy with Taxol. Outpatient follow-up with ophthalmologist for evaluation of possible metastatic disease to extraocular muscles.  Anemia of chronic disease: Hemoglobin is trending down.  Transfuse 1 unit of packed red blood cells.  Discussed risk and benefits and alternatives of blood transfusion and patient is agreeable to blood transfusion.  She said she was transfused in December 2021 without any incident.  Monitor H&H.  Asymptomatic COVID-19 infection: Completed IV remdesivir on 10/29/2020.  Chronic hypoxic respiratory failure: Continue 3 L/min oxygen via nasal cannula.  Severe protein calorie malnutrition, failure to thrive: Encourage use of dietary supplements and adequate oral intake.  Follow-up with dietitian.  Generalized weakness: PT and OT recommended discharge to SNF  Hypokalemia and hyperkalemia: Resolved  Stage II decubitus ulcers of the right buttocks (present on admission): Continue local wound care.  Patient encouraged to turn frequently.  Recent discharge from the hospital on 10/21/2020 after hospitalization for sepsis secondary to pneumonia complicated by acute hypoxic respiratory failure.        Diet Order            Diet full liquid Room service appropriate? Yes; Fluid consistency: Thin  Diet effective now                    Consultants:  Oncologist  Palliative care team  Procedures:  None    Medications:   . sodium chloride   Intravenous Once  .  apixaban  10 mg Oral BID   Followed by  . [START ON 11/02/2020] apixaban  5 mg Oral BID  . vitamin C  500 mg Oral Daily  . calcium carbonate  1,250 mg Oral Q breakfast  . Chlorhexidine Gluconate Cloth  6 each Topical  Daily  . dronabinol  5 mg Oral BID AC  . feeding supplement  237 mL Oral QID  . insulin aspart  0-5 Units Subcutaneous QHS  . insulin aspart  0-9 Units Subcutaneous TID WC  . magic mouthwash w/lidocaine  15 mL Oral TID  . magnesium chloride  1 tablet Oral Daily  . metoprolol tartrate  50 mg Oral BID  . multivitamin with minerals  1 tablet Oral Daily  . polyethylene glycol  17 g Oral BID  . predniSONE  30 mg Oral Q breakfast  . zinc sulfate  220 mg Oral Daily   Continuous Infusions:    Anti-infectives (From admission, onward)   Start     Dose/Rate Route Frequency Ordered Stop   10/26/20 1000  remdesivir 100 mg in sodium chloride 0.9 % 100 mL IVPB       "Followed by" Linked Group Details   100 mg 200 mL/hr over 30 Minutes Intravenous Daily 10/25/20 1648 10/29/20 0918   10/25/20 1800  remdesivir 200 mg in sodium chloride 0.9% 250 mL IVPB       "Followed by" Linked Group Details   200 mg 580 mL/hr over 30 Minutes Intravenous Once 10/25/20 1648 10/25/20 2130             Family Communication/Anticipated D/C date and plan/Code Status   DVT prophylaxis: Place TED hose Start: 10/25/20 1530 apixaban (ELIQUIS) tablet 10 mg  apixaban (ELIQUIS) tablet 5 mg     Code Status: Full Code  Family Communication: None Disposition Plan:    Status is: Inpatient  Remains inpatient appropriate because:IV treatments appropriate due to intensity of illness or inability to take PO and Inpatient level of care appropriate due to severity of illness   Dispo: The patient is from: Home              Anticipated d/c is to: Home              Patient currently is not medically stable to d/c.   Difficult to place patient No           Subjective:   She complains of generalized weakness.  No shortness of breath or chest pain.  Her legs are still swollen.  Objective:    Vitals:   10/29/20 0353 10/29/20 0400 10/29/20 0606 10/29/20 0800  BP:  119/67  113/66  Pulse: 98 99  (!) 105   Resp: 20 (!) 21  (!) 22  Temp: 99.1 F (37.3 C)   (!) 97.5 F (36.4 C)  TempSrc: Oral   Oral  SpO2: 100% 100%  100%  Weight:   69.4 kg   Height:       No data found.   Intake/Output Summary (Last 24 hours) at 10/29/2020 1238 Last data filed at 10/29/2020 0809 Gross per 24 hour  Intake -  Output 900 ml  Net -900 ml   Filed Weights   10/26/20 0400 10/27/20 0400 10/29/20 0606  Weight: 68.6 kg 69.9 kg 69.4 kg    Exam:  GEN: NAD SKIN: Stage II decubitus ulcers on the left and right buttocks EYES: Pale but anicteric ENT: MMM CV: RRR PULM: CTA B ABD: soft, ND, NT, +BS CNS:  AAO x 3, non focal EXT: Bilateral lower extremity edema.  No erythema or tenderness.     Pressure Injury 10/25/20 Buttocks Left;Right Stage 2 -  Partial thickness loss of dermis presenting as a shallow open injury with a red, pink wound bed without slough. (Active)  10/25/20 2100  Location: Buttocks  Location Orientation: Left;Right  Staging: Stage 2 -  Partial thickness loss of dermis presenting as a shallow open injury with a red, pink wound bed without slough.  Wound Description (Comments):   Present on Admission: Yes     Data Reviewed:   I have personally reviewed following labs and imaging studies:  Labs: Labs show the following:   Basic Metabolic Panel: Recent Labs  Lab 10/25/20 1130 10/26/20 0504 10/27/20 0611 10/28/20 0601 10/29/20 0349  NA 151* 152* 144 145 145  K 3.2* 4.0 5.6* 4.3 3.9  CL 105 108 102 103 104  CO2 33* 33* 31 33* 34*  GLUCOSE 193* 131* 94 95 90  BUN 44* 40* 33* 27* 23*  CREATININE 0.62 0.47 0.41* 0.41* 0.38*  CALCIUM 8.7* 9.0 9.3 8.0* 7.7*  MG 2.9*  --  2.1  --   --   PHOS 2.3*  --  3.4  --   --    GFR Estimated Creatinine Clearance: 84.9 mL/min (A) (by C-G formula based on SCr of 0.38 mg/dL (L)). Liver Function Tests: Recent Labs  Lab 10/25/20 1130 10/26/20 0504 10/27/20 0611 10/28/20 0601 10/29/20 0349  AST 86* 63* 64* 74* 69*  ALT 24 17 18 17  17   ALKPHOS 300* 272* 288* 296* 285*  BILITOT 0.8 0.7 0.7 0.7 0.5  PROT 6.8 5.7* 5.8* 5.3* 5.0*  ALBUMIN 2.9* 2.5* 2.4* 2.2* 2.0*   No results for input(s): LIPASE, AMYLASE in the last 168 hours. No results for input(s): AMMONIA in the last 168 hours. Coagulation profile Recent Labs  Lab 10/25/20 1130  INR 1.8*    CBC: Recent Labs  Lab 10/25/20 1130 10/26/20 0504 10/27/20 0611 10/28/20 0601 10/29/20 0349  WBC 5.0 4.7 2.1* 4.5 4.7  NEUTROABS 3.8 3.4 1.3* 2.4 2.9  HGB 9.3* 7.9* 8.7* 8.1* 7.2*  HCT 33.2* 28.6* 30.7* 27.2* 24.9*  MCV 87.8 89.1 89.0 85.8 86.5  PLT 195 165 196 242 243   Cardiac Enzymes: Recent Labs  Lab 10/25/20 1915  CKTOTAL 44   BNP (last 3 results) No results for input(s): PROBNP in the last 8760 hours. CBG: Recent Labs  Lab 10/28/20 1237 10/28/20 1738 10/28/20 2106 10/29/20 0807 10/29/20 1142  GLUCAP 166* 117* 88 101* 124*   D-Dimer: Recent Labs    10/28/20 0601 10/29/20 0349  DDIMER 7.93* 4.84*   Hgb A1c: No results for input(s): HGBA1C in the last 72 hours. Lipid Profile: No results for input(s): CHOL, HDL, LDLCALC, TRIG, CHOLHDL, LDLDIRECT in the last 72 hours. Thyroid function studies: No results for input(s): TSH, T4TOTAL, T3FREE, THYROIDAB in the last 72 hours.  Invalid input(s): FREET3 Anemia work up: No results for input(s): VITAMINB12, FOLATE, FERRITIN, TIBC, IRON, RETICCTPCT in the last 72 hours. Sepsis Labs: Recent Labs  Lab 10/25/20 1130 10/25/20 1450 10/25/20 1915 10/26/20 0504 10/27/20 0611 10/28/20 0601 10/29/20 0349  PROCALCITON  --   --  73.80  --   --   --   --   WBC 5.0  --   --  4.7 2.1* 4.5 4.7  LATICACIDVEN 4.9* 4.2*  --   --   --   --   --  Microbiology Recent Results (from the past 240 hour(s))  Culture, blood (Routine x 2)     Status: None (Preliminary result)   Collection Time: 10/25/20  2:49 PM   Specimen: BLOOD  Result Value Ref Range Status   Specimen Description BLOOD BLOOD LEFT HAND   Final   Special Requests   Final    BOTTLES DRAWN AEROBIC AND ANAEROBIC Blood Culture results may not be optimal due to an inadequate volume of blood received in culture bottles   Culture   Final    NO GROWTH 4 DAYS Performed at [facility], [address] [place], [place] [address]    Report Status PENDING  Incomplete  Culture, blood (Routine x 2)     Status: None (Preliminary result)   Collection Time: 10/25/20  3:29 PM   Specimen: BLOOD  Result Value Ref Range Status   Specimen Description BLOOD BLOOD LEFT HAND  Final   Special Requests   Final    BOTTLES DRAWN AEROBIC AND ANAEROBIC Blood Culture adequate volume   Culture   Final    NO GROWTH 4 DAYS Performed at [facility], [address], [place], [place] [address]    Report Status PENDING  Incomplete  Resp Panel by RT-PCR (Flu A&B, Covid) Nasopharyngeal Swab     Status: Abnormal   Collection Time: 10/25/20  3:30 PM   Specimen: Nasopharyngeal Swab; Nasopharyngeal(NP) swabs in vial transport medium  Result Value Ref Range Status   SARS Coronavirus 2 by RT PCR POSITIVE (A) NEGATIVE Final    Comment: RESULT CALLED TO, READ BACK BY AND VERIFIED WITH: laura hernandez 10/25/20 at 1641 by acr (NOTE) SARS-CoV-2 target nucleic acids are DETECTED.  The SARS-CoV-2 RNA is generally detectable in upper respiratory specimens during the acute phase of infection. Positive results are indicative of the presence of the identified virus, but do not rule out bacterial infection or co-infection with other pathogens not detected by the test. Clinical correlation with patient history and other diagnostic information is necessary to determine patient infection status. The expected result is Negative.  Fact Sheet for Patients: EntrepreneurPulse.com.au  Fact Sheet for Healthcare Providers: IncredibleEmployment.be  This test is not yet approved or cleared by the [place] FDA and   has been authorized for detection and/or diagnosis of SARS-CoV-2 by FDA under an Emergency Use Authorization (EUA).  This EUA will remain in effect (meaning this test c an be used) for the duration of  the COVID-19 declaration under Section 564(b)(1) of the Act, 21 U.S.C. section 360bbb-3(b)(1), unless the authorization is terminated or revoked sooner.     Influenza A by PCR NEGATIVE NEGATIVE Final   Influenza B by PCR NEGATIVE NEGATIVE Final    Comment: (NOTE) The Xpert Xpress SARS-CoV-2/FLU/RSV plus assay is intended as an aid in the diagnosis of influenza from Nasopharyngeal swab specimens and should not be used as a sole basis for treatment. Nasal washings and aspirates are unacceptable for Xpert Xpress SARS-CoV-2/FLU/RSV testing.  Fact Sheet for Patients: EntrepreneurPulse.com.au  Fact Sheet for Healthcare Providers: IncredibleEmployment.be  This test is not yet approved or cleared by the [place] FDA and has been authorized for detection and/or diagnosis of SARS-CoV-2 by FDA under an Emergency Use Authorization (EUA). This EUA will remain in effect (meaning this test can be used) for the duration of the COVID-19 declaration under Section 564(b)(1) of the Act, 21 U.S.C. section 360bbb-3(b)(1), unless the authorization is terminated or revoked.  Performed at [facility], [address] [place], [place] [address]     Procedures and diagnostic studies:  No results found.             LOS: 3 days   BERNARD AYIKU  [facility]   Pager on [email or web address]. If 7PM-7AM, please contact night-coverage at [email or web address]     10/29/2020, 12:38 PM

## 2020-10-30 DIAGNOSIS — R627 Adult failure to thrive: Secondary | ICD-10-CM | POA: Diagnosis not present

## 2020-10-30 DIAGNOSIS — I82411 Acute embolism and thrombosis of right femoral vein: Secondary | ICD-10-CM | POA: Diagnosis not present

## 2020-10-30 DIAGNOSIS — D638 Anemia in other chronic diseases classified elsewhere: Secondary | ICD-10-CM | POA: Diagnosis not present

## 2020-10-30 DIAGNOSIS — C50919 Malignant neoplasm of unspecified site of unspecified female breast: Secondary | ICD-10-CM | POA: Diagnosis not present

## 2020-10-30 LAB — BPAM RBC
Blood Product Expiration Date: 202203232359
ISSUE DATE / TIME: 202203191439
Unit Type and Rh: 9500

## 2020-10-30 LAB — COMPREHENSIVE METABOLIC PANEL
ALT: 17 U/L (ref 0–44)
AST: 66 U/L — ABNORMAL HIGH (ref 15–41)
Albumin: 2 g/dL — ABNORMAL LOW (ref 3.5–5.0)
Alkaline Phosphatase: 288 U/L — ABNORMAL HIGH (ref 38–126)
Anion gap: 7 (ref 5–15)
BUN: 24 mg/dL — ABNORMAL HIGH (ref 6–20)
CO2: 32 mmol/L (ref 22–32)
Calcium: 8.1 mg/dL — ABNORMAL LOW (ref 8.9–10.3)
Chloride: 105 mmol/L (ref 98–111)
Creatinine, Ser: 0.4 mg/dL — ABNORMAL LOW (ref 0.44–1.00)
GFR, Estimated: >60 mL/min (ref >60)
Glucose, Bld: 88 mg/dL (ref 70–99)
Potassium: 4.2 mmol/L (ref 3.5–5.1)
Sodium: 144 mmol/L (ref 135–145)
Total Bilirubin: 0.7 mg/dL (ref 0.3–1.2)
Total Protein: 5.1 g/dL — ABNORMAL LOW (ref 6.5–8.1)

## 2020-10-30 LAB — CULTURE, BLOOD (ROUTINE X 2)
Culture: NO GROWTH
Culture: NO GROWTH
Special Requests: ADEQUATE

## 2020-10-30 LAB — TYPE AND SCREEN
ABO/RH(D): O NEG
Antibody Screen: NEGATIVE
Unit division: 0

## 2020-10-30 LAB — CBC WITH DIFFERENTIAL/PLATELET
Abs Immature Granulocytes: 0.74 10*3/uL — ABNORMAL HIGH (ref 0.00–0.07)
Basophils Absolute: 0.1 10*3/uL (ref 0.0–0.1)
Basophils Relative: 1 %
Eosinophils Absolute: 0 10*3/uL (ref 0.0–0.5)
Eosinophils Relative: 0 %
HCT: 28.6 % — ABNORMAL LOW (ref 36.0–46.0)
Hemoglobin: 8.4 g/dL — ABNORMAL LOW (ref 12.0–15.0)
Immature Granulocytes: 10 %
Lymphocytes Relative: 13 %
Lymphs Abs: 0.9 10*3/uL (ref 0.7–4.0)
MCH: 25.6 pg — ABNORMAL LOW (ref 26.0–34.0)
MCHC: 29.4 g/dL — ABNORMAL LOW (ref 30.0–36.0)
MCV: 87.2 fL (ref 80.0–100.0)
Monocytes Absolute: 1.2 10*3/uL — ABNORMAL HIGH (ref 0.1–1.0)
Monocytes Relative: 16 %
Neutro Abs: 4.6 10*3/uL (ref 1.7–7.7)
Neutrophils Relative %: 60 %
Platelets: 316 10*3/uL (ref 150–400)
RBC: 3.28 MIL/uL — ABNORMAL LOW (ref 3.87–5.11)
RDW: 16.4 % — ABNORMAL HIGH (ref 11.5–15.5)
Smear Review: NORMAL
WBC: 7.5 10*3/uL (ref 4.0–10.5)
nRBC: 0.8 % — ABNORMAL HIGH (ref 0.0–0.2)

## 2020-10-30 LAB — GLUCOSE, CAPILLARY
Glucose-Capillary: 86 mg/dL (ref 70–99)
Glucose-Capillary: 123 mg/dL — ABNORMAL HIGH (ref 70–99)
Glucose-Capillary: 124 mg/dL — ABNORMAL HIGH (ref 70–99)
Glucose-Capillary: 122 mg/dL — ABNORMAL HIGH (ref 70–99)

## 2020-10-30 LAB — D-DIMER, QUANTITATIVE: D-Dimer, Quant: 3.5 ug/mL-FEU — ABNORMAL HIGH (ref 0.00–0.50)

## 2020-10-30 LAB — C-REACTIVE PROTEIN: CRP: 17.2 mg/dL — ABNORMAL HIGH (ref <1.0)

## 2020-10-30 MED ORDER — SODIUM CHLORIDE 0.9% FLUSH
10.0000 mL | INTRAVENOUS | Status: DC | PRN
  Administered 2020-10-30 – 2020-11-12 (×3): 10 mL via INTRAVENOUS

## 2020-10-30 NOTE — Progress Notes (Addendum)
Progress Note    Jessica Willis  YJE:[id number] DOB: Mar 18, 1966  DOA: 10/25/2020 PCP: [facility]      Brief Narrative:    Medical records reviewed and are as summarized below:  Jessica Willis is a 55 y.o. 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  She was eventually discharged home on home oxygen for chronic hypoxic respiratory failure.  She presented to the hospital again on 10/25/2020 because of poor oral intake, generalized weakness and frequent falls at home since discharge from the hospital.  She also complained of swelling and tingling in the right lower extremity.  She was found to have hypernatremia, hypokalemia and right lower extremity DVT.  She was treated with IV fluids hypokalemia was repleted.  She was started on Eliquis for treatment of DVT.      Assessment/Plan:   Principal Problem:   Failure to thrive in adult Active Problems:   Breast mass, right   Metastatic breast cancer (HCC)   Anemia, chronic disease   Iron deficiency anemia   Bone metastasis (HCC)   Port-A-Cath in place   Chemotherapy-induced nausea   Poor appetite   Weakness   Dehydration   COVID-19 virus infection   Protein-calorie malnutrition, severe   Hypernatremia   Acute deep vein thrombosis (DVT) of right femoral vein (HCC)   Pressure injury of skin   Nutrition Problem: Severe Malnutrition Etiology: acute illness (HCAP, COVID 19)  Signs/Symptoms: moderate fat depletion,moderate muscle depletion,severe muscle depletion,percent weight loss Percent weight loss: 12  %   Body mass index is 21.94 kg/m.    Acute right femoral DVT: Continue Eliquis  Metastatic triple negative right breast cancer with high tumor burden: Chemotherapy was initiated on 3/4/2022but progressed after first-line palliative chemotherapy with Taxol. Outpatient follow-up with ophthalmologist for evaluation of possible metastatic disease to extraocular muscles.  Anemia of chronic disease: Hemoglobin improved s/p transfusion with 1 unit of PRBCs on 10/29/2020.  Monitor H&H.  Asymptomatic COVID-19 infection: Completed IV remdesivir on 10/29/2020.  Chronic hypoxic respiratory failure: Continue 3 L/min oxygen via nasal cannula.  Severe protein calorie malnutrition, failure to thrive: Encourage use of dietary supplements and adequate oral intake.  Follow-up with dietitian.  Generalized weakness: PT and OT recommended discharge to SNF  Hypokalemia, hyperkalemia, hypernatremia, dehydration: Resolved  Stage II decubitus ulcers of the right buttocks (present on admission): Continue local wound care.  Patient encouraged to turn frequently.  Recent discharge from the hospital on 10/21/2020 after hospitalization for sepsis secondary to pneumonia complicated by acute hypoxic respiratory failure.  Awaiting placement to SNF      Diet Order            DIET SOFT Room service appropriate? Yes; Fluid consistency: Thin  Diet effective now                    Consultants:  Oncologist  Palliative care team  Procedures:  None    Medications:   . apixaban  10 mg Oral BID   Followed by  . [START ON 11/02/2020] apixaban  5 mg Oral BID  . vitamin C  500 mg Oral Daily  . calcium carbonate  1,250 mg Oral Q breakfast  .  Chlorhexidine Gluconate Cloth  6 each Topical Daily  . dronabinol  5 mg Oral BID AC  . feeding supplement  237 mL Oral QID  . insulin aspart  0-5 Units Subcutaneous QHS  . insulin aspart  0-9 Units Subcutaneous TID WC  . magic mouthwash w/lidocaine  15 mL Oral  TID  . magnesium chloride  1 tablet Oral Daily  . metoprolol tartrate  50 mg Oral BID  . multivitamin with minerals  1 tablet Oral Daily  . polyethylene glycol  17 g Oral BID  . predniSONE  30 mg Oral Q breakfast  . zinc sulfate  220 mg Oral Daily   Continuous Infusions:    Anti-infectives (From admission, onward)   Start     Dose/Rate Route Frequency Ordered Stop   10/26/20 1000  remdesivir 100 mg in sodium chloride 0.9 % 100 mL IVPB       "Followed by" Linked Group Details   100 mg 200 mL/hr over 30 Minutes Intravenous Daily 10/25/20 1648 10/29/20 1859   10/25/20 1800  remdesivir 200 mg in sodium chloride 0.9% 250 mL IVPB       "Followed by" Linked Group Details   200 mg 580 mL/hr over 30 Minutes Intravenous Once 10/25/20 1648 10/25/20 2130             Family Communication/Anticipated D/C date and plan/Code Status   DVT prophylaxis: Place TED hose Start: 10/25/20 1530 apixaban (ELIQUIS) tablet 10 mg  apixaban (ELIQUIS) tablet 5 mg     Code Status: Full Code  Family Communication: None Disposition Plan:    Status is: Inpatient  Remains inpatient appropriate because:IV treatments appropriate due to intensity of illness or inability to take PO and Inpatient level of care appropriate due to severity of illness   Dispo: The patient is from: Home              Anticipated d/c is to: Home              Patient currently is medically stable to d/c.   Difficult to place patient No           Subjective:   Interval events noted.  No shortness of breath or chest pain.  Her nurse was at the bedside.  Objective:    Vitals:   10/29/20 2052 10/30/20 0147 10/30/20 0524 10/30/20 0812  BP: 120/73 (!) 111/49 (!) 113/59 110/61  Pulse: (!) 108 99 (!) 103 (!) 101  Resp: 16 16 16 17   Temp: 98 F (36.7 C) 98.3 F (36.8 C) 97.7 F (36.5 C) 97.9 F (36.6 C)  TempSrc: Oral Oral Oral Oral  SpO2: 99% 97% 97% 98%  Weight:      Height:       No data  found.   Intake/Output Summary (Last 24 hours) at 10/30/2020 1108 Last data filed at 10/29/2020 1830 Gross per 24 hour  Intake 692.75 ml  Output 600 ml  Net 92.75 ml   Filed Weights   10/26/20 0400 10/27/20 0400 10/29/20 0606  Weight: 68.6 kg 69.9 kg 69.4 kg    Exam:  GEN: NAD SKIN: Stage II decubitus ulcers on the left and right buttocks. EYES: No pallor or icterus ENT: MMM CV: RRR PULM: CTA B ABD: soft, ND, NT, +BS CNS: AAO x 3, non focal EXT: Bilateral leg edema without tenderness or erythema.     Pressure Injury 10/25/20 Buttocks Left;Right Stage 2 -  Partial thickness loss of dermis presenting as a shallow open  injury with a red, pink wound bed without slough. (Active)  10/25/20 2100  Location: Buttocks  Location Orientation: Left;Right  Staging: Stage 2 -  Partial thickness loss of dermis presenting as a shallow open injury with a red, pink wound bed without slough.  Wound Description (Comments):   Present on Admission: Yes     Data Reviewed:   I have personally reviewed following labs and imaging studies:  Labs: Labs show the following:   Basic Metabolic Panel: Recent Labs  Lab 10/25/20 1130 10/26/20 0504 10/27/20 0611 10/28/20 0601 10/29/20 0349 10/30/20 0421  NA 151* 152* 144 145 145 144  K 3.2* 4.0 5.6* 4.3 3.9 4.2  CL 105 108 102 103 104 105  CO2 33* 33* 31 33* 34* 32  GLUCOSE 193* 131* 94 95 90 88  BUN 44* 40* 33* 27* 23* 24*  CREATININE 0.62 0.47 0.41* 0.41* 0.38* 0.40*  CALCIUM 8.7* 9.0 9.3 8.0* 7.7* 8.1*  MG 2.9*  --  2.1  --   --   --   PHOS 2.3*  --  3.4  --   --   --    GFR Estimated Creatinine Clearance: 84.9 mL/min (A) (by C-G formula based on SCr of 0.4 mg/dL (L)). Liver Function Tests: Recent Labs  Lab 10/26/20 0504 10/27/20 0611 10/28/20 0601 10/29/20 0349 10/30/20 0421  AST 63* 64* 74* 69* 66*  ALT 17 18 17 17 17   ALKPHOS 272* 288* 296* 285* 288*  BILITOT 0.7 0.7 0.7 0.5 0.7  PROT 5.7* 5.8* 5.3* 5.0* 5.1*  ALBUMIN  2.5* 2.4* 2.2* 2.0* 2.0*   No results for input(s): LIPASE, AMYLASE in the last 168 hours. No results for input(s): AMMONIA in the last 168 hours. Coagulation profile Recent Labs  Lab 10/25/20 1130  INR 1.8*    CBC: Recent Labs  Lab 10/26/20 0504 10/27/20 0611 10/28/20 0601 10/29/20 0349 10/30/20 0421  WBC 4.7 2.1* 4.5 4.7 7.5  NEUTROABS 3.4 1.3* 2.4 2.9 4.6  HGB 7.9* 8.7* 8.1* 7.2* 8.4*  HCT 28.6* 30.7* 27.2* 24.9* 28.6*  MCV 89.1 89.0 85.8 86.5 87.2  PLT 165 196 242 243 316   Cardiac Enzymes: Recent Labs  Lab 10/25/20 1915  CKTOTAL 44   BNP (last 3 results) No results for input(s): PROBNP in the last 8760 hours. CBG: Recent Labs  Lab 10/29/20 0807 10/29/20 1142 10/29/20 1659 10/29/20 2051 10/30/20 0814  GLUCAP 101* 124* 128* 131* 86   D-Dimer: Recent Labs    10/29/20 0349 10/30/20 0421  DDIMER 4.84* 3.50*   Hgb A1c: No results for input(s): HGBA1C in the last 72 hours. Lipid Profile: No results for input(s): CHOL, HDL, LDLCALC, TRIG, CHOLHDL, LDLDIRECT in the last 72 hours. Thyroid function studies: No results for input(s): TSH, T4TOTAL, T3FREE, THYROIDAB in the last 72 hours.  Invalid input(s): FREET3 Anemia work up: No results for input(s): VITAMINB12, FOLATE, FERRITIN, TIBC, IRON, RETICCTPCT in the last 72 hours. Sepsis Labs: Recent Labs  Lab 10/25/20 1130 10/25/20 1450 10/25/20 1915 10/26/20 0504 10/27/20 0611 10/28/20 0601 10/29/20 0349 10/30/20 0421  PROCALCITON  --   --  73.80  --   --   --   --   --   WBC 5.0  --   --    < > 2.1* 4.5 4.7 7.5  LATICACIDVEN 4.9* 4.2*  --   --   --   --   --   --    < > = values in this interval not displayed.  Microbiology Recent Results (from the past 240 hour(s))  Culture, blood (Routine x 2)     Status: None   Collection Time: 10/25/20  2:49 PM   Specimen: BLOOD  Result Value Ref Range Status   Specimen Description BLOOD BLOOD LEFT HAND  Final   Special Requests   Final    BOTTLES  DRAWN AEROBIC AND ANAEROBIC Blood Culture results may not be optimal due to an inadequate volume of blood received in culture bottles   Culture   Final    NO GROWTH 5 DAYS Performed at [facility], [address] [place], [place] [address]    Report Status 10/30/2020 FINAL  Final  Culture, blood (Routine x 2)     Status: None   Collection Time: 10/25/20  3:29 PM   Specimen: BLOOD  Result Value Ref Range Status   Specimen Description BLOOD BLOOD LEFT HAND  Final   Special Requests   Final    BOTTLES DRAWN AEROBIC AND ANAEROBIC Blood Culture adequate volume   Culture   Final    NO GROWTH 5 DAYS Performed at [facility], [address] [place], [place] [address]    Report Status 10/30/2020 FINAL  Final  Resp Panel by RT-PCR (Flu A&B, Covid) Nasopharyngeal Swab     Status: Abnormal   Collection Time: 10/25/20  3:30 PM   Specimen: Nasopharyngeal Swab; Nasopharyngeal(NP) swabs in vial transport medium  Result Value Ref Range Status   SARS Coronavirus 2 by RT PCR POSITIVE (A) NEGATIVE Final    Comment: RESULT CALLED TO, READ BACK BY AND VERIFIED WITH: laura hernandez 10/25/20 at 1641 by acr (NOTE) SARS-CoV-2 target nucleic acids are DETECTED.  The SARS-CoV-2 RNA is generally detectable in upper respiratory specimens during the acute phase of infection. Positive results are indicative of the presence of the identified virus, but do not rule out bacterial infection or co-infection with other pathogens not detected by the test. Clinical correlation with patient history and other diagnostic information is necessary to determine patient infection status. The expected result is Negative.  Fact Sheet for Patients: EntrepreneurPulse.com.au  Fact Sheet for Healthcare Providers: IncredibleEmployment.be  This test is not yet approved or cleared by the [place] FDA and  has been authorized for detection and/or diagnosis of  SARS-CoV-2 by FDA under an Emergency Use Authorization (EUA).  This EUA will remain in effect (meaning this test c an be used) for the duration of  the COVID-19 declaration under Section 564(b)(1) of the Act, 21 U.S.C. section 360bbb-3(b)(1), unless the authorization is terminated or revoked sooner.     Influenza A by PCR NEGATIVE NEGATIVE Final   Influenza B by PCR NEGATIVE NEGATIVE Final    Comment: (NOTE) The Xpert Xpress SARS-CoV-2/FLU/RSV plus assay is intended as an aid in the diagnosis of influenza from Nasopharyngeal swab specimens and should not be used as a sole basis for treatment. Nasal washings and aspirates are unacceptable for Xpert Xpress SARS-CoV-2/FLU/RSV testing.  Fact Sheet for Patients: EntrepreneurPulse.com.au  Fact Sheet for Healthcare Providers: IncredibleEmployment.be  This test is not yet approved or cleared by the [place] FDA and has been authorized for detection and/or diagnosis of SARS-CoV-2 by FDA under an Emergency Use Authorization (EUA). This EUA will remain in effect (meaning this test can be used) for the duration of the COVID-19 declaration under Section 564(b)(1) of the Act, 21 U.S.C. section 360bbb-3(b)(1), unless the authorization is terminated or revoked.  Performed at [address], [place],  [place],  [address]     Procedures and diagnostic studies:  No results found.             LOS: 4 days   Khamryn Calderone  [facility]   Pager on [email or web address]. If 7PM-7AM, please contact night-coverage at [email or web address]     10/30/2020, 11:08 AM

## 2020-10-30 NOTE — Progress Notes (Signed)
   10/28/20 0400  Assess: MEWS Score  Temp 97.9 F (36.6 C)  BP 115/66  Pulse Rate (!) 109  ECG Heart Rate (!) 111  Resp 20  Level of Consciousness Alert  SpO2 100 %  Assess: MEWS Score  MEWS Temp 0  MEWS Systolic 0  MEWS Pulse 2  MEWS RR 0  MEWS LOC 0  MEWS Score 2  MEWS Score Color Yellow  Assess: if the MEWS score is Yellow or Red  Were vital signs taken at a resting state? Yes  Focused Assessment Change from prior assessment (see assessment flowsheet)  Early Detection of Sepsis Score *See Row Information* Low  MEWS guidelines implemented *See Row Information* Yes  Treat  MEWS Interventions Administered scheduled meds/treatments  Pain Scale 0-10  Pain Score 0  Take Vital Signs  Increase Vital Sign Frequency  Yellow: Q 2hr X 2 then Q 4hr X 2, if remains yellow, continue Q 4hrs  Notify: Charge Nurse/RN  Name of Charge Nurse/RN Notified Melissa, CN  Date Charge Nurse/RN Notified 10/28/20  Time Charge Nurse/RN Notified 0400  Document  Patient Outcome Other (Comment) (Pt at her baseline of elevated HR.)  Progress note created (see row info) Yes

## 2020-10-31 ENCOUNTER — Ambulatory Visit: Payer: Medicaid Other | Admitting: Cardiology

## 2020-10-31 DIAGNOSIS — E43 Unspecified severe protein-calorie malnutrition: Secondary | ICD-10-CM | POA: Diagnosis not present

## 2020-10-31 DIAGNOSIS — R630 Anorexia: Secondary | ICD-10-CM | POA: Diagnosis not present

## 2020-10-31 DIAGNOSIS — I82411 Acute embolism and thrombosis of right femoral vein: Secondary | ICD-10-CM | POA: Diagnosis not present

## 2020-10-31 DIAGNOSIS — C50919 Malignant neoplasm of unspecified site of unspecified female breast: Secondary | ICD-10-CM | POA: Diagnosis not present

## 2020-10-31 DIAGNOSIS — R627 Adult failure to thrive: Secondary | ICD-10-CM | POA: Diagnosis not present

## 2020-10-31 LAB — GLUCOSE, CAPILLARY
Glucose-Capillary: 80 mg/dL (ref 70–99)
Glucose-Capillary: 102 mg/dL — ABNORMAL HIGH (ref 70–99)
Glucose-Capillary: 101 mg/dL — ABNORMAL HIGH (ref 70–99)
Glucose-Capillary: 107 mg/dL — ABNORMAL HIGH (ref 70–99)

## 2020-10-31 MED ORDER — PREDNISONE 20 MG PO TABS
20.0000 mg | ORAL_TABLET | Freq: Every day | ORAL | Status: DC
  Administered 2020-11-01: 20 mg via ORAL
  Filled 2020-10-31: qty 1

## 2020-10-31 NOTE — Progress Notes (Addendum)
   10/30/20 2159  Assess: MEWS Score  Temp 97.9 F (36.6 C)  BP 122/71  Pulse Rate (!) 122  Resp 18  Level of Consciousness Alert  SpO2 97 %  O2 Device Nasal Cannula  O2 Flow Rate (L/min) 3 L/min  Assess: MEWS Score  MEWS Temp 0  MEWS Systolic 0  MEWS Pulse 2  MEWS RR 0  MEWS LOC 0  MEWS Score 2  MEWS Score Color Yellow  Assess: if the MEWS score is Yellow or Red  Were vital signs taken at a resting state? Yes  Focused Assessment No change from prior assessment  Early Detection of Sepsis Score *See Row Information* Low  MEWS guidelines implemented *See Row Information* No, vital signs rechecked  Treat  Pain Scale 0-10  Pain Score 0  Take Vital Signs  Increase Vital Sign Frequency  Yellow: Q 2hr X 2 then Q 4hr X 2, if remains yellow, continue Q 4hrs  Notify: Provider  Provider Name/Title Rufina Falco, NP  Date Provider Notified 10/31/20  Time Provider Notified 0000  Notification Type Page  Notification Reason Other (Comment) (Pt was tachycardic after receiving metoprolol)  Provider response No new orders  Document  Progress note created (see row info) Yes  Pt was given 50 mg metoprolol as scheduled and was still tachy.  Notified on call NP, she doesn't want to treat as metoprolol effectiveness is between 8-12 hours.  We are putting her on tele to be monitored continuously.

## 2020-10-31 NOTE — Progress Notes (Signed)
Hematology/Oncology Progress Note [facility] Telephone:([phone] [phone]  Patient Care Team: [place] as PCP - General Rico Junker, RN as Registered Nurse Benjamine Sprague, DO as Consulting Physician (Surgery)   Name of the patient: Jessica Willis  [id number]  Oct 04, 1965  Date of visit: 10/31/20   INTERVAL HISTORY-  Status post 1 unit of PRBC transfusion over the weekend. Patient was seen at the bedside.  Appetite is not good.  No acute overnight issue.  She feels the breathing is okay. On 3 L of nasal cannula oxygen.    Review of systems- Review of Systems  Constitutional: Positive for appetite change and fatigue. Negative for chills and fever.  HENT:   Negative for hearing loss and voice change.   Eyes: Negative for eye problems.  Respiratory: Negative for chest tightness, cough and shortness of breath.   Cardiovascular: Negative for chest pain.  Gastrointestinal: Negative for abdominal distention, abdominal pain and blood in stool.  Endocrine: Negative for hot flashes.  Genitourinary: Negative for difficulty urinating and frequency.   Musculoskeletal: Negative for arthralgias.  Skin: Negative for itching and rash.  Neurological: Negative for extremity weakness.  Hematological: Negative for adenopathy.  Psychiatric/Behavioral: Negative for confusion.    No Known Allergies  Patient Active Problem List   Diagnosis Date Noted  . Pressure injury of skin 10/27/2020  . Protein-calorie malnutrition, severe 10/26/2020  . Hypernatremia 10/26/2020  . Acute deep vein thrombosis (DVT) of right femoral vein ([place]) 10/26/2020  . Weakness 10/25/2020  . Failure to thrive in adult 10/25/2020  . Dehydration 10/25/2020  . COVID-19 virus infection 10/25/2020  . Chemotherapy-induced nausea   . Poor appetite   . Pneumonia due to infectious organism 10/07/2020  . QT prolongation 09/22/2020  . Pneumonia due to  Streptococcus pyogenes ([place]) 08/22/2020  . Genetic testing 07/27/2020  . Pancreatic lesion 07/22/2020  . Swelling of lower leg 07/13/2020  . Encounter for antineoplastic chemotherapy 07/04/2020  . Complication of chemotherapy 07/04/2020  . Family history of breast cancer   . Port-A-Cath in place   . Bone metastasis ([place])   . Hypocalcemia   . Folate deficiency   . Gastroesophageal reflux disease without esophagitis   . Weakness of right arm   . Palliative care encounter   . Iron deficiency anemia   . Anemia, chronic disease   . Tachycardia   . Metastatic breast cancer ([place])   . Malignant pleural effusion   . Chest tube in place   . Goals of care, counseling/discussion   . Hemothorax on right 06/16/2020  . Acute respiratory failure with hypoxia ([place])   . Breast mass, right 06/15/2020  . Mastitis 06/15/2020  . Pleural effusion 06/15/2020  . Intestinal obstruction ([place])   . Leukocytosis   . Hypokalemia 06/24/2015  . Sepsis ([place]) 06/23/2015  . CAP (community acquired pneumonia) 06/23/2015  . Partial small bowel obstruction ([place]) 06/23/2015     Past Medical History:  Diagnosis Date  . Anemia   . Family history of breast cancer   . Patient denies medical problems      Past Surgical History:  Procedure Laterality Date  . BREAST BIOPSY  06/28/2020   Procedure: BREAST BIOPSY;  Surgeon: Benjamine Sprague, DO;  Location: [facility] ORS;  Service: General;;  . PORTACATH PLACEMENT N/A 06/28/2020   Procedure: INSERTION PORT-A-CATH;  Surgeon: Benjamine Sprague, DO;  Location: [facility] ORS;  Service: General;  Laterality: N/A;  . TUBAL LIGATION    . VIDEO ASSISTED  THORACOSCOPY (VATS)/THOROCOTOMY Right 06/23/2020   Procedure: ATTEMPTED VIDEO ASSISTED THORACOSCOPY (VATS);  Surgeon: Nestor Lewandowsky, MD;  Location: [facility] ORS;  Service: General;  Laterality: Right;    Social History   Socioeconomic History  . Marital status: Single    Spouse name: Not on file  . Number of children: Not on file  .  Years of education: Not on file  . Highest education level: Not on file  Occupational History  . Not on file  Tobacco Use  . Smoking status: Never Smoker  . Smokeless tobacco: Never Used  Vaping Use  . Vaping Use: Never used  Substance and Sexual Activity  . Alcohol use: Not Currently    Alcohol/week: 1.0 standard drink    Types: 1 Cans of beer per week  . Drug use: No  . Sexual activity: Not on file  Other Topics Concern  . Not on file  Social History Narrative  . Not on file   Social Determinants of Health   Financial Resource Strain: Not on file  Food Insecurity: Not on file  Transportation Needs: Not on file  Physical Activity: Not on file  Stress: Not on file  Social Connections: Not on file  Intimate Partner Violence: Not on file     Family History  Problem Relation Age of Onset  . Diabetes Other   . Hypertension Other   . Diabetes Maternal Aunt   . Breast cancer Cousin        dx 32s  . Breast cancer Cousin        dx 23s     Current Facility-Administered Medications:  .  albuterol (PROVENTIL) (2.5 MG/3ML) 0.083% nebulizer solution 2.5 mg, 2.5 mg, Nebulization, Q6H PRN, Cox, Amy N, DO .  albuterol (VENTOLIN HFA) 108 (90 Base) MCG/ACT inhaler 2 puff, 2 puff, Inhalation, Q4H PRN, Cox, Amy N, DO .  apixaban (ELIQUIS) tablet 10 mg, 10 mg, Oral, BID, 10 mg at 10/31/20 0854 **FOLLOWED BY** [START ON 11/02/2020] apixaban (ELIQUIS) tablet 5 mg, 5 mg, Oral, BID, Jennye Boroughs, MD .  ascorbic acid (VITAMIN C) tablet 500 mg, 500 mg, Oral, Daily, Cox, Amy N, DO, 500 mg at 10/31/20 0854 .  calcium carbonate (OS-CAL - dosed in mg of elemental calcium) tablet 1,250 mg, 1,250 mg, Oral, Q breakfast, Cox, Amy N, DO, 1,250 mg at 10/31/20 0854 .  Chlorhexidine Gluconate Cloth 2 % PADS 6 each, 6 each, Topical, Daily, Jennye Boroughs, MD, 6 each at 10/31/20 [phone]  chlorpheniramine-HYDROcodone (TUSSIONEX) 10-8 MG/5ML suspension 5 mL, 5 mL, Oral, Q12H PRN, Cox, Amy N, DO .  dronabinol  (MARINOL) capsule 5 mg, 5 mg, Oral, BID AC, Cox, Amy N, DO, 5 mg at 10/30/20 1732 .  feeding supplement (ENSURE ENLIVE / ENSURE PLUS) liquid 237 mL, 237 mL, Oral, QID, Jennye Boroughs, MD, 237 mL at 10/31/20 0854 .  guaiFENesin-dextromethorphan (ROBITUSSIN DM) 100-10 MG/5ML syrup 10 mL, 10 mL, Oral, Q4H PRN, Cox, Amy N, DO .  insulin aspart (novoLOG) injection 0-5 Units, 0-5 Units, Subcutaneous, QHS, Cox, Amy N, DO .  insulin aspart (novoLOG) injection 0-9 Units, 0-9 Units, Subcutaneous, TID WC, Cox, Amy N, DO, 1 Units at 10/30/20 1732 .  magic mouthwash w/lidocaine, 15 mL, Oral, TID, Cox, Amy N, DO, 15 mL at 10/30/20 2034 .  magnesium chloride (SLOW-MAG) 64 MG SR tablet 64 mg, 1 tablet, Oral, Daily, Cox, Amy N, DO, 64 mg at 10/31/20 0853 .  metoprolol tartrate (LOPRESSOR) tablet 50 mg, 50 mg, Oral, BID,  Cox, Amy N, DO, 50 mg at 10/31/20 0853 .  morphine 4 MG/ML injection 4 mg, 4 mg, Intravenous, Q4H PRN, Cox, Amy N, DO, 4 mg at 10/28/20 1853 .  multivitamin with minerals tablet 1 tablet, 1 tablet, Oral, Daily, Cox, Amy N, DO, 1 tablet at 10/31/20 0854 .  ondansetron (ZOFRAN) injection 4 mg, 4 mg, Intravenous, Q8H PRN, Cox, Amy N, DO .  oxyCODONE-acetaminophen (PERCOCET) 7.5-325 MG per tablet 1 tablet, 1 tablet, Oral, Q8H PRN, Cox, Amy N, DO, 1 tablet at 10/25/20 1900 .  polyethylene glycol (MIRALAX / GLYCOLAX) packet 17 g, 17 g, Oral, BID, Cox, Amy N, DO, 17 g at 10/28/20 2157 .  predniSONE (DELTASONE) tablet 30 mg, 30 mg, Oral, Q breakfast, Earlie Server, MD, 30 mg at 10/31/20 0853 .  sodium chloride flush (NS) 0.9 % injection 10 mL, 10 mL, Intravenous, PRN, Jennye Boroughs, MD, 10 mL at 10/30/20 1346 .  zinc sulfate capsule 220 mg, 220 mg, Oral, Daily, Cox, Amy N, DO, 220 mg at 10/30/20 0900   Physical exam:  Vitals:   10/31/20 0200 10/31/20 0600 10/31/20 0811 10/31/20 1159  BP: 106/65 117/71 118/62 (!) 104/55  Pulse: (!) 102 (!) 106 (!) 108 91  Resp: 18 18 16 18   Temp: 98.2 F (36.8 C) 97.7 F  (36.5 C) 97.9 F (36.6 C) 97.9 F (36.6 C)  TempSrc: Oral Oral    SpO2: 97% 98% 99% 97%  Weight:      Height:       Physical Exam Constitutional:      General: She is not in acute distress.    Appearance: She is not diaphoretic.     Comments: Cachectic  HENT:     Head: Normocephalic and atraumatic.     Nose: Nose normal.     Mouth/Throat:     Pharynx: No oropharyngeal exudate.  Eyes:     General: No scleral icterus.    Pupils: Pupils are equal, round, and reactive to light.  Cardiovascular:     Rate and Rhythm: Normal rate and regular rhythm.     Heart sounds: No murmur heard.   Pulmonary:     Effort: Pulmonary effort is normal. No respiratory distress.     Breath sounds: No rales.  Chest:     Chest wall: No tenderness.  Abdominal:     General: There is no distension.     Palpations: Abdomen is soft.     Tenderness: There is no abdominal tenderness.  Musculoskeletal:        General: Normal range of motion.     Cervical back: Normal range of motion and neck supple.  Skin:    General: Skin is warm and dry.     Findings: No erythema.  Neurological:     Mental Status: She is alert and oriented to person, place, and time.     Cranial Nerves: No cranial nerve deficit.     Motor: No abnormal muscle tone.     Coordination: Coordination normal.  Psychiatric:        Mood and Affect: Affect normal.   Right breast mass with chronic drainage covered with dressing    CMP Latest Ref Rng & Units 10/30/2020  Glucose 70 - 99 mg/dL 88  BUN 6 - 20 mg/dL 24(H)  Creatinine 0.44 - 1.00 mg/dL 0.40(L)  Sodium 135 - 145 mmol/L 144  Potassium 3.5 - 5.1 mmol/L 4.2  Chloride 98 - 111 mmol/L 105  CO2 22 - 32 mmol/L 32  Calcium 8.9 - 10.3 mg/dL 8.1(L)  Total Protein 6.5 - 8.1 g/dL 5.1(L)  Total Bilirubin 0.3 - 1.2 mg/dL 0.7  Alkaline Phos 38 - 126 U/L 288(H)  AST 15 - 41 U/L 66(H)  ALT 0 - 44 U/L 17   CBC Latest Ref Rng & Units 10/30/2020  WBC 4.0 - 10.5 K/uL 7.5  Hemoglobin  12.0 - 15.0 g/dL 8.4(L)  Hematocrit 36.0 - 46.0 % 28.6(L)  Platelets 150 - 400 K/uL 316    RADIOGRAPHIC STUDIES: I have personally reviewed the radiological images as listed and agreed with the findings in the report. DG Chest 2 View  Result Date: 10/07/2020 CLINICAL DATA:  Hypoxia, shortness of breath, metastatic breast cancer EXAM: CHEST - 2 VIEW COMPARISON:  09/13/2020 FINDINGS: LEFT subclavian Port-A-Cath with tip projecting over RIGHT atrium. Enlargement of cardiac silhouette. Pulmonary vascular congestion. Persistent enlargement of RIGHT hilum question mass or adenopathy. Increased infiltrate of the mid to lower RIGHT lung. Small RIGHT pleural effusion again seen. Accentuation of markings in LEFT perihilar region little changed. No pneumothorax or acute osseous findings. IMPRESSION: Increased RIGHT perihilar infiltrate with persistent small RIGHT pleural effusion. Enlargement of RIGHT pulmonary hilum by adenopathy versus perihilar mass. Electronically Signed   By: Lavonia Dana M.D.   On: 10/07/2020 11:32   DG Lumbar Spine Complete  Result Date: 10/25/2020 CLINICAL DATA:  Pain following fall EXAM: LUMBAR SPINE - COMPLETE 4+ VIEW COMPARISON:  None. FINDINGS: Frontal, lateral, spot lumbosacral lateral, and bilateral oblique views were obtained. There are 5 non-rib-bearing lumbar type vertebral bodies. There is no fracture or spondylolisthesis. The disc spaces appear normal. There is no appreciable facet arthropathy. IMPRESSION: No fracture or spondylolisthesis.  No evident arthropathy. Electronically Signed   By: Lowella Grip III M.D.   On: 10/25/2020 14:08   CT Angio Chest PE W and/or Wo Contrast  Result Date: 10/07/2020 CLINICAL DATA:  Breast carcinoma. Tachycardia and shortness of breath EXAM: CT ANGIOGRAPHY CHEST WITH CONTRAST TECHNIQUE: Multidetector CT imaging of the chest was performed using the standard protocol during bolus administration of intravenous contrast. Multiplanar CT image  reconstructions and MIPs were obtained to evaluate the vascular anatomy. CONTRAST:  31mL OMNIPAQUE IOHEXOL 350 MG/ML SOLN COMPARISON:  CT angiogram chest June 15, 2020; chest radiograph October 07, 2020 FINDINGS: Cardiovascular: There is no evident pulmonary embolus. No appreciable thoracic aortic aneurysm or dissection. Visualized great vessels appear unremarkable. There is no appreciable pericardial effusion or pericardial thickening. Port-A-Cath tip is in the superior vena cava near the cavoatrial junction. Mediastinum/Nodes: Thyroid appears normal. There are multiple axillary lymph nodes bilaterally, more severe on the right than on the left. The largest lymph node is seen in the right axillary region measuring 2.7 x 2.1 cm. There is adenopathy in the right supraclavicular and infraclavicular regions with extension of adenopathy into the right Peri carinal region. Largest individual lymph node in these areas measures 2.6 x 1.8 cm. Several left supraclavicular lymph nodes are also noted, largest measuring 1.7 x 1.3 cm. There are several subcentimeter mediastinal lymph nodes. There is extensive retrocrural adenopathy. The largest lymph node or collection of matted lymph nodes is seen inferior to the aorta on the left posteriorly measuring 3.4 x 2.1 cm. No esophageal lesions are evident. Lungs/Pleura: There is extensive airspace consolidation in portions of the right middle and lower lobes with interspersed areas of loculated pleural effusion. There is ill-defined airspace opacity consistent with pneumonia in the left upper lobe. Loculated fluid tracks along the left major fissure.  There may well be associated metastasis within the fissure given the extensive nodularity in this area. There is a small left pleural effusion with left base atelectasis. Pleural metastases noted along each upper hemithorax, primarily posteriorly as well as in the right apex. Upper Abdomen: Retrocrural adenopathy extends into the  upper abdominal region. Visualized upper abdominal structures otherwise appear unremarkable. Musculoskeletal: There is a large mass occupying the right breast and chest wall region with invasion of the deep muscles of the chest wall on the right. There is also invasion of the right axilla. There is extensive subcutaneous thickening in both breast regions. There is a mass in the lateral left breast with extension of soft tissue prominence along the lateral left hemithorax, likely due to extension of neoplasm into the adjacent soft tissues. There is widespread sclerotic bony metastatic disease throughout the thoracic region. Review of the MIP images confirms the above findings. IMPRESSION: 1. No demonstrable pulmonary embolus. No thoracic aortic aneurysm or dissection. 2. Areas of airspace consolidation noted in the right middle and lower lobes. More ill-defined infiltrate is noted in the left upper lobe anteriorly. Areas of loculated fluid noted in the left major fissure. Nodularity in this area could indicate superimposed masses in left major fissure. There is pleural thickening along both posterior upper hemithorax regions, likely pleural metastases. Loculated fluid noted in areas of infiltrate on the right. Small left pleural effusion noted. 3. Adenopathy at multiple sites, most severe in the retrocrural regions bilaterally, right supraclavicular region, and right axillary region, although there is left axillary and left supraclavicular adenopathy. 4. Large mass lesions occupying the right breast with extension and invasion of the chest wall and right axillary regions. A lesser degree of metastatic disease to the left of midline in the subcutaneous tissues and lateral left breast/lateral left chest wall soft tissues noted. 5. Multifocal sclerotic bony metastatic disease throughout the thoracic region noted. Electronically Signed   By: Lowella Grip III M.D.   On: 10/07/2020 13:23   MR BRAIN W WO  CONTRAST  Addendum Date: 10/26/2020   ADDENDUM REPORT: 10/26/2020 13:14 ADDENDUM: The conclusion mentions that breast cancer is thought unlikely for the orbital findings. On further review, given the patient's widespread metastatic disease and given that breast cancer does have a propensity for orbital metastasis (occasionally multifocal/bilateral), metastatic breast cancer is not excluded. The additional differential considerations mentioned in the conclusion remain possibilities, particularly orbital lymphoma given the restricted diffusion. Consider ophthalmology consultation. Findings discussed with Dr. Jennye Boroughs via telephone at 1:10 pm. Electronically Signed   By: Margaretha Sheffield MD   On: 10/26/2020 13:14   Result Date: 10/26/2020 CLINICAL DATA:  Neuro deficit, acute stroke suspected. Right leg paresthesias and discomfort. EXAM: MRI HEAD WITHOUT AND WITH CONTRAST TECHNIQUE: Multiplanar, multiecho pulse sequences of the brain and surrounding structures were obtained without and with intravenous contrast. CONTRAST:  29mL GADAVIST GADOBUTROL 1 MMOL/ML IV SOLN COMPARISON:  MRI November 12 21. FINDINGS: Brain: No acute infarction, hemorrhage, hydrocephalus, extra-axial collection or intracranial mass lesion. No abnormal intracranial enhancement. Vascular: Major arterial flow voids are maintained at the skull base. Skull and upper cervical spine: Increased size/conspicuity of scattered T1 hypointense metastasis in the upper cervical spine. Additionally, increased size of the clival skull metastasis, now measuring 1.4 cm. T1 hypointensity of the right occipital calvarium appears similar to prior and is concerning for osseous metastatic disease. Sinuses/Orbits: Interval development of marked enlargement of multiple extraocular muscles, including bulky enlargement of the left medial and lateral rectus  muscles and right lateral rectus muscle. Additionally, there is evidence of involvement of the right medial  rectus muscle anteriorly (series 17, image 26 and series 5, image 19), superior rectus musculature anteriorly bilaterally (series 17, 26; series 5, image 22) and the inferior rectus muscle on the right posteriorly (series 17, image 24; series 5, image 16). The enlarged extra-ocular muscles demonstrate T2 hypointensity and restricted diffusion, suggesting high cellularity. There is involvement of the muscle bodies as well as tendinous insertions in areas. Other: No sizable mastoid effusions. IMPRESSION: 1. No evidence of acute infarct or intracranial brain metastasis. 2. Interval development of marked enlargement of multiple bilateral extraocular muscles, detailed above and most prominently involving the medial and lateral rectus. The enlarged extra-ocular muscles demonstrate T2 hypointensity and restricted diffusion, suggesting high cellularity. While nonspecific, this finding can be seen with orbital lymphoma. Idiopathic orbital inflammatory pseudotumor and IgG4-related orbital disease are additional differential considerations. Metastatic breast cancer was considered given the clinical history, but thought unlikely given the diffuse nature of the process. 3. Partially imaged metastases in the upper cervical spine and clival metastasis appear larger and more conspicuous, concerning for progression of bony metastatic disease. Similar T1 hypointensity right occipital calvarium, concerning for an additional site of osseous metastatic disease. Findings discussed with Dr. Jaclynn Guarneri at 5:52 p.m. via telephone. Electronically Signed: By: Margaretha Sheffield MD On: 10/25/2020 18:17   US Venous Img Lower Unilateral Right (DVT)  Result Date: 10/25/2020 CLINICAL DATA:  Initial evaluation for acute right lower extremity pain and swelling. EXAM: RIGHT LOWER EXTREMITY VENOUS DOPPLER ULTRASOUND TECHNIQUE: Gray-scale sonography with graded compression, as well as color Doppler and duplex ultrasound were performed to evaluate the  lower extremity deep venous systems from the level of the common femoral vein and including the common femoral, femoral, profunda femoral, popliteal and calf veins including the posterior tibial, peroneal and gastrocnemius veins when visible. The superficial great saphenous vein was also interrogated. Spectral Doppler was utilized to evaluate flow at rest and with distal augmentation maneuvers in the common femoral, femoral and popliteal veins. COMPARISON:  None. FINDINGS: Contralateral Common Femoral Vein: Respiratory phasicity is normal and symmetric with the symptomatic side. No evidence of thrombus. Normal compressibility. Common Femoral Vein: No evidence of thrombus. Normal compressibility, respiratory phasicity and response to augmentation. Saphenofemoral Junction: Echogenic thrombus seen at the saphenofemoral junction. Profunda Femoral Vein: No evidence of thrombus. Normal compressibility and flow on color Doppler imaging. Femoral Vein: Echogenic partially occlusive thrombus seen throughout the proximal, mid, and distal right femoral vein. Popliteal Vein: Echogenic occlusive thrombus seen throughout the right popliteal vein. Calf Veins: Echogenic occlusive thrombus seen within the right peroneal and posterior tibial veins. Superficial Great Saphenous Vein: No evidence of thrombus. Normal compressibility. Venous Reflux:  None. Other Findings:  None. IMPRESSION: Positive study with extensive acute DVT extending from the proximal right femoral vein through the veins of the calf. Electronically Signed   By: Jeannine Boga M.D.   On: 10/25/2020 18:40   US Venous Img Upper Uni Right(DVT)  Result Date: 10/08/2020 CLINICAL DATA:  History of stage IV breast cancer now with right upper extremity pain and edema. Evaluate for DVT. EXAM: RIGHT UPPER EXTREMITY VENOUS DOPPLER ULTRASOUND TECHNIQUE: Gray-scale sonography with graded compression, as well as color Doppler and duplex ultrasound were performed to  evaluate the upper extremity deep venous system from the level of the subclavian vein and including the jugular, axillary, basilic, radial, ulnar and upper cephalic vein. Spectral Doppler was utilized to evaluate flow  at rest and with distal augmentation maneuvers. COMPARISON:  None. FINDINGS: Contralateral Subclavian Vein: Respiratory phasicity is normal and symmetric with the symptomatic side. No evidence of thrombus. Normal compressibility. Internal Jugular Vein: No evidence of thrombus. Normal compressibility, respiratory phasicity and response to augmentation. Subclavian Vein: No evidence of thrombus. Normal compressibility, respiratory phasicity and response to augmentation. Axillary Vein: Appears patent where imaged though evaluation degraded secondary to patient's discomfort with sonographic evaluation of the axilla. Cephalic Vein: No evidence of thrombus. Normal compressibility, respiratory phasicity and response to augmentation. Basilic Vein: No evidence of thrombus. Normal compressibility, respiratory phasicity and response to augmentation. Brachial Veins: No evidence of thrombus within either of the paired brachial veins. Normal compressibility, respiratory phasicity and response to augmentation. Radial Veins: No evidence of thrombus. Normal compressibility, respiratory phasicity and response to augmentation. Ulnar Veins: No evidence of thrombus. Normal compressibility, respiratory phasicity and response to augmentation. Venous Reflux:  None visualized. Other Findings: There is a moderate amount of subcutaneous edema at the level of the right upper arm. IMPRESSION: No evidence of DVT within the right upper extremity. Electronically Signed   By: Sandi Mariscal M.D.   On: 10/08/2020 14:29   DG Chest Port 1 View  Result Date: 10/25/2020 CLINICAL DATA:  Chest pain. EXAM: PORTABLE CHEST 1 VIEW COMPARISON:  Single-view of the chest 10/15/2020 and 10/13/2020. CT chest 10/07/2020. FINDINGS: Right pleural  effusion and opacities projecting over the right chest consistent with known chest wall and intrathoracic metastatic breast carcinoma again seen. Scattered opacities in the left chest are also again seen. Aeration has improved bilaterally since the most recent exam. Small right pleural effusion is noted. The left lung is clear. Heart size is normal. IMPRESSION: Aeration in both lungs appears improved compared to the most recent study in this patient with extensive metastatic breast cancer. No new abnormality. Electronically Signed   By: Inge Rise M.D.   On: 10/25/2020 12:19   DG Chest Port 1 View  Result Date: 10/15/2020 CLINICAL DATA:  Pneumonia and pleural effusion. Metastatic breast cancer. EXAM: PORTABLE CHEST 1 VIEW COMPARISON:  10/13/2020 chest radiograph and prior studies FINDINGS: Cardiomediastinal silhouette is unchanged with RIGHT mediastinal fullness. A LEFT subclavian Port-A-Cath with tip overlying the UPPER RIGHT atrium again noted. Scattered opacities within both lungs are again noted with a RIGHT pleural effusion. There is no evidence of pneumothorax. No significant changes are identified. IMPRESSION: Unchanged appearance of the chest with scattered opacities within both lungs and RIGHT pleural effusion. Electronically Signed   By: Margarette Canada M.D.   On: 10/15/2020 11:06   DG Chest Port 1 View  Result Date: 10/13/2020 CLINICAL DATA:  Pleural effusion EXAM: PORTABLE CHEST 1 VIEW COMPARISON:  10/07/2020 FINDINGS: Unchanged positioning of left-sided Port-A-Cath. Stable cardiomediastinal contours. Persistent small right pleural effusion. Slight progression of airspace opacity throughout the right lung most pronounced within the right lung base. Similar interstitial opacities throughout the left lung. No pneumothorax. IMPRESSION: 1. Slight progression of airspace opacity throughout the right lung, most pronounced within the right lung base. 2. Persistent small right pleural effusion.  Electronically Signed   By: Davina Poke D.O.   On: 10/13/2020 14:10   ECHOCARDIOGRAM COMPLETE  Result Date: 10/10/2020    ECHOCARDIOGRAM REPORT   Patient Name:   MIKAYLA CHIUSANO Date of Exam: 10/10/2020 Medical Rec #:  [id number]           Height:       70.0 in Accession #:    [id number]  Weight:       178.7 lb Date of Birth:  Jun 16, 1966           BSA:          1.990 m Patient Age:    66 years            BP:           119/79 mmHg Patient Gender: F                   HR:           105 bpm. Exam Location:  [facility] Procedure: 2D Echo, Limited Color Doppler and Cardiac Doppler Indications:     R06.03 Acute Respiratory Distress  History:         Patient has no prior history of Echocardiogram examinations.                  Signs/Symptoms:Shortness of Breath. Breast cancer.  Sonographer:     Charmayne Sheer RDCS (AE) Referring Phys:  [id number] Sharen Hones Diagnosing Phys: Kathlyn Sacramento MD  Sonographer Comments: Technically challenging study due to limited acoustic windows. IMPRESSIONS  1. Left ventricular ejection fraction, by estimation, is 50 to 55%. The left ventricle has low normal function. Left ventricular endocardial border not optimally defined to evaluate regional wall motion. Left ventricular diastolic parameters are indeterminate.  2. Right ventricular systolic function is normal. The right ventricular size is normal. Tricuspid regurgitation signal is inadequate for assessing PA pressure.  3. The mitral valve is normal in structure. No evidence of mitral valve regurgitation. No evidence of mitral stenosis.  4. The aortic valve is normal in structure. Aortic valve regurgitation is not visualized. No aortic stenosis is present.  5. The inferior vena cava is normal in size with greater than 50% respiratory variability, suggesting right atrial pressure of 3 mmHg. FINDINGS  Left Ventricle: Left ventricular ejection fraction, by estimation, is 50 to 55%. The left ventricle has low normal function. Left  ventricular endocardial border not optimally defined to evaluate regional wall motion. The left ventricular internal cavity  size was normal in size. There is no left ventricular hypertrophy. Left ventricular diastolic parameters are indeterminate. Right Ventricle: The right ventricular size is normal. No increase in right ventricular wall thickness. Right ventricular systolic function is normal. Tricuspid regurgitation signal is inadequate for assessing PA pressure. Left Atrium: Left atrial size was normal in size. Right Atrium: Right atrial size was normal in size. Pericardium: There is no evidence of pericardial effusion. Mitral Valve: The mitral valve is normal in structure. No evidence of mitral valve regurgitation. No evidence of mitral valve stenosis. MV peak gradient, 4.5 mmHg. The mean mitral valve gradient is 2.0 mmHg. Tricuspid Valve: The tricuspid valve is normal in structure. Tricuspid valve regurgitation is not demonstrated. No evidence of tricuspid stenosis. Aortic Valve: The aortic valve is normal in structure. Aortic valve regurgitation is not visualized. No aortic stenosis is present. Aortic valve mean gradient measures 4.0 mmHg. Aortic valve peak gradient measures 6.8 mmHg. Pulmonic Valve: The pulmonic valve was not well visualized. Pulmonic valve regurgitation is not visualized. No evidence of pulmonic stenosis. Aorta: The aortic root is normal in size and structure. Venous: The inferior vena cava is normal in size with greater than 50% respiratory variability, suggesting right atrial pressure of 3 mmHg. IAS/Shunts: No atrial level shunt detected by color flow Doppler.   LV Volumes (MOD) LV vol d, MOD A4C: 99.4 ml Diastology LV vol s, MOD A4C:  58.3 ml LV e' lateral:   10.30 cm/s LV SV MOD A4C:     99.4 ml LV E/e' lateral: 5.8  LEFT ATRIUM           Index LA Vol (A4C): 41.5 ml 20.86 ml/m  AORTIC VALVE AV Vmax:           130.00 cm/s AV Vmean:          93.400 cm/s AV VTI:            0.243 m AV Peak  Grad:      6.8 mmHg AV Mean Grad:      4.0 mmHg LVOT Vmax:         92.70 cm/s LVOT Vmean:        60.500 cm/s LVOT VTI:          0.151 m LVOT/AV VTI ratio: 0.62 MITRAL VALVE MV Area (PHT): 6.96 cm    SHUNTS MV Peak grad:  4.5 mmHg    Systemic VTI: 0.15 m MV Mean grad:  2.0 mmHg MV Vmax:       1.06 m/s MV Vmean:      67.9 cm/s MV Decel Time: 109 msec MV E velocity: 59.60 cm/s MV A velocity: 68.60 cm/s MV E/A ratio:  0.87 Kathlyn Sacramento MD Electronically signed by Kathlyn Sacramento MD Signature Date/Time: 10/10/2020/2:00:06 PM    Final    DG HIP UNILAT W OR W/O PELVIS 2-3 VIEWS RIGHT  Result Date: 10/25/2020 CLINICAL DATA:  History of metastatic breast cancer. Patient status post fall. Initial encounter. EXAM: DG HIP (WITH OR WITHOUT PELVIS) 2-3V RIGHT COMPARISON:  PET CT scan 07/19/2020. FINDINGS: No acute bony or joint abnormality is identified. Scattered sclerotic lesions are present in both hips and the pelvis consistent with known metastatic breast cancer. Soft tissues are negative. IMPRESSION: No acute abnormality. Scattered sclerotic lesions consistent with known metastatic breast cancer. Electronically Signed   By: Inge Rise M.D.   On: 10/25/2020 14:09    Assessment and plan-  #Acute right lower extremity proximal DVT  Thrombosis probably due to immobilization secondary to recent hospitalization, widely metastatic cancer, COVID-19 infection etc. continue Eliquis.  Monitor hemoglobin Agree with blood transfusion.  Hemoglobin has increased appropriately.  Monitor.   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 10/14/2020, status post salvage second line palliative chemotherapy with Adriamycin.  She is currently in visceral crisis.  Prognosis is poor.  We had multiple discussions and she is not ready for comfort care/hospice.  She desires additional treatment if her body can tolerate.    #Failure to thrive, severe protein calorie malnutrition. Continue nutrition  supplementation. Slow taper prednisone to 20 mg daily.   #Severe protein calorie malnutrition.    Marinol 5 mg twice daily..  Nutrition supplementation. #Chronic respiratory failure, continue albuterol nebulizer and inhaler.  Nasal cannula oxygen.  Wean as tolerated. #Tachycardia, on beta-blocker. #PT OT evaluation.  Likely need to go to rehab. #Right breast wound,decubitus ulcers of the right buttocks -wound care #?metastatic disease to extraocular muscles -ophthalmology evaluation #Covid infection,remdesivir.  Disposition: Patient will need to go to rehab facility. CODE STATUS, full code.  Thank you for allowing me to participate in the care of this patient.   Earlie Server, MD, PhD Hematology Oncology [facility] Pager- [phone] 10/31/2020

## 2020-10-31 NOTE — Progress Notes (Signed)
   10/31/20 2055  Assess: MEWS Score  Temp 98.6 F (37 C)  BP 119/65  Pulse Rate (!) 113  Resp 20  SpO2 99 %  O2 Device Room Air  Assess: MEWS Score  MEWS Temp 0  MEWS Systolic 0  MEWS Pulse 2  MEWS RR 0  MEWS LOC 0  MEWS Score 2  MEWS Score Color Yellow  Assess: if the MEWS score is Yellow or Red  Were vital signs taken at a resting state? Yes  Focused Assessment No change from prior assessment  Early Detection of Sepsis Score *See Row Information* Low  MEWS guidelines implemented *See Row Information* Yes  Treat  MEWS Interventions Administered scheduled meds/treatments  Pain Scale 0-10  Pain Score 0  Take Vital Signs  Increase Vital Sign Frequency  Yellow: Q 2hr X 2 then Q 4hr X 2, if remains yellow, continue Q 4hrs  Escalate  MEWS: Escalate Yellow: discuss with charge nurse/RN and consider discussing with provider and RRT  Notify: Charge Nurse/RN  Name of Charge Nurse/RN Notified Stanton Kidney, RN  Date Charge Nurse/RN Notified 10/31/20  Time Charge Nurse/RN Notified 2059  Administered 50mg  of metoprolol per order as scheduled.

## 2020-10-31 NOTE — Progress Notes (Signed)
Progress Note    Jessica Willis  NGE:[id number] DOB: 1966-02-13  DOA: 10/25/2020 PCP: [place]      Brief Narrative:    Medical records reviewed and are as summarized below:  Jessica Willis is a 55 y.o. 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  She was eventually discharged home on home oxygen for chronic hypoxic respiratory failure.  She presented to the hospital again on 10/25/2020 because of poor oral intake, generalized weakness and frequent falls at home since discharge from the hospital.  She also complained of swelling and tingling in the right lower extremity.  She was found to have hypernatremia, hypokalemia and right lower extremity DVT.  She was treated with IV fluids hypokalemia was repleted.  She was started on Eliquis for treatment of DVT.      Assessment/Plan:   Principal Problem:   Failure to thrive in adult Active Problems:   Breast mass, right   Metastatic breast cancer (HCC)   Anemia, chronic disease   Iron deficiency anemia   Bone metastasis (HCC)   Port-A-Cath in place   Chemotherapy-induced nausea   Poor appetite   Weakness   Dehydration   COVID-19 virus infection   Protein-calorie malnutrition, severe   Hypernatremia   Acute deep vein thrombosis (DVT) of right femoral vein (HCC)   Pressure injury of skin   Nutrition Problem: Severe Malnutrition Etiology: acute illness (HCAP, COVID 19)  Signs/Symptoms: moderate fat depletion,moderate muscle depletion,severe muscle depletion,percent weight loss Percent weight loss: 12  %   Body mass index is 21.94 kg/m.    Acute right femoral DVT: Continue Eliquis  Metastatic triple negative right breast cancer with high tumor burden: Chemotherapy was initiated on 10/14/2020 but progressed after first-line palliative chemotherapy with Taxol. Outpatient follow-up with ophthalmologist for evaluation of possible metastatic disease to extraocular muscles.  Anemia of chronic disease: Hemoglobin improved s/p transfusion with 1 unit of PRBCs on 10/29/2020.  Monitor H&H.  Asymptomatic COVID-19 infection: Completed IV remdesivir on 10/29/2020.  Chronic hypoxic respiratory failure: Continue 3L/min oxygen  Severe protein calorie malnutrition, failure to thrive: Encourage use of dietary supplements and adequate oral intake.  Follow-up with dietitian.  Generalized weakness: PT and OT recommended discharge to SNF  Hypokalemia, hyperkalemia, hypernatremia, dehydration: Resolved  Stage II decubitus ulcers of the right buttocks (present on admission): Continue local wound care.  Patient encouraged to turn frequently.  Recent discharge from the hospital on 10/21/2020 after hospitalization for sepsis secondary to pneumonia complicated by acute hypoxic respiratory failure.  Awaiting placement to SNF      Diet Order            DIET SOFT Room service appropriate? Yes; Fluid consistency: Thin  Diet effective now                    Consultants:  Oncologist  Palliative care team  Procedures:  None    Medications:   . apixaban  10 mg Oral BID   Followed by  . [START ON 11/02/2020] apixaban  5 mg Oral BID  . vitamin C  500 mg Oral Daily  . calcium carbonate  1,250 mg Oral Q breakfast  . Chlorhexidine  Gluconate Cloth  6 each Topical Daily  . dronabinol  5 mg Oral BID AC  . feeding supplement  237 mL Oral QID  . insulin aspart  0-5 Units Subcutaneous QHS  . insulin aspart  0-9 Units Subcutaneous TID WC  . magic mouthwash w/lidocaine  15 mL Oral TID  . magnesium  chloride  1 tablet Oral Daily  . metoprolol tartrate  50 mg Oral BID  . multivitamin with minerals  1 tablet Oral Daily  . polyethylene glycol  17 g Oral BID  . [START ON 11/01/2020] predniSONE  20 mg Oral Q breakfast  . zinc sulfate  220 mg Oral Daily   Continuous Infusions:    Anti-infectives (From admission, onward)   Start     Dose/Rate Route Frequency Ordered Stop   10/26/20 1000  remdesivir 100 mg in sodium chloride 0.9 % 100 mL IVPB       "Followed by" Linked Group Details   100 mg 200 mL/hr over 30 Minutes Intravenous Daily 10/25/20 1648 10/29/20 1859   10/25/20 1800  remdesivir 200 mg in sodium chloride 0.9% 250 mL IVPB       "Followed by" Linked Group Details   200 mg 580 mL/hr over 30 Minutes Intravenous Once 10/25/20 1648 10/25/20 2130             Family Communication/Anticipated D/C date and plan/Code Status   DVT prophylaxis: Place TED hose Start: 10/25/20 1530 apixaban (ELIQUIS) tablet 10 mg  apixaban (ELIQUIS) tablet 5 mg     Code Status: Full Code  Family Communication: None Disposition Plan:    Status is: Inpatient  Remains inpatient appropriate because:IV treatments appropriate due to intensity of illness or inability to take PO and Inpatient level of care appropriate due to severity of illness   Dispo: The patient is from: Home              Anticipated d/c is to: Home              Patient currently is medically stable to d/c.   Difficult to place patient No           Subjective:   No new complaints. Interval events noted  Objective:    Vitals:   10/31/20 0200 10/31/20 0600 10/31/20 0811 10/31/20 1159  BP: 106/65 117/71 118/62 (!) 104/55  Pulse: (!) 102 (!) 106 (!) 108 91  Resp: 18 18 16 18   Temp: 98.2 F (36.8 C) 97.7 F (36.5 C) 97.9 F (36.6 C) 97.9 F (36.6 C)  TempSrc: Oral Oral    SpO2: 97% 98% 99% 97%  Weight:      Height:       No data found.  No intake or output data in the 24 hours ending 10/31/20  1446 Filed Weights   10/26/20 0400 10/27/20 0400 10/29/20 0606  Weight: 68.6 kg 69.9 kg 69.4 kg    Exam:  GEN: NAD SKIN: Stage II decubitus ulcers on the left and right buttocks EYES: EOMI ENT: MMM CV: RRR PULM: CTA B ABD: soft, ND, NT, +BS CNS: AAO x 3, non focal EXT: B/l leg edema (right >left), no tenderness or erythema      Pressure Injury 10/25/20 Buttocks Left;Right Stage 2 -  Partial thickness loss of dermis presenting as a shallow open injury with a red, pink wound bed without slough. (Active)  10/25/20 2100  Location: Buttocks  Location Orientation: Left;Right  Staging: Stage 2 -  Partial thickness loss of dermis presenting  as a shallow open injury with a red, pink wound bed without slough.  Wound Description (Comments):   Present on Admission: Yes     Data Reviewed:   I have personally reviewed following labs and imaging studies:  Labs: Labs show the following:   Basic Metabolic Panel: Recent Labs  Lab 10/25/20 1130 10/26/20 0504 10/27/20 0611 10/28/20 0601 10/29/20 0349 10/30/20 0421  NA 151* 152* 144 145 145 144  K 3.2* 4.0 5.6* 4.3 3.9 4.2  CL 105 108 102 103 104 105  CO2 33* 33* 31 33* 34* 32  GLUCOSE 193* 131* 94 95 90 88  BUN 44* 40* 33* 27* 23* 24*  CREATININE 0.62 0.47 0.41* 0.41* 0.38* 0.40*  CALCIUM 8.7* 9.0 9.3 8.0* 7.7* 8.1*  MG 2.9*  --  2.1  --   --   --   PHOS 2.3*  --  3.4  --   --   --    GFR Estimated Creatinine Clearance: 84.9 mL/min (A) (by C-G formula based on SCr of 0.4 mg/dL (L)). Liver Function Tests: Recent Labs  Lab 10/26/20 0504 10/27/20 0611 10/28/20 0601 10/29/20 0349 10/30/20 0421  AST 63* 64* 74* 69* 66*  ALT 17 18 17 17 17   ALKPHOS 272* 288* 296* 285* 288*  BILITOT 0.7 0.7 0.7 0.5 0.7  PROT 5.7* 5.8* 5.3* 5.0* 5.1*  ALBUMIN 2.5* 2.4* 2.2* 2.0* 2.0*   No results for input(s): LIPASE, AMYLASE in the last 168 hours. No results for input(s): AMMONIA in the last 168 hours. Coagulation profile Recent  Labs  Lab 10/25/20 1130  INR 1.8*    CBC: Recent Labs  Lab 10/26/20 0504 10/27/20 0611 10/28/20 0601 10/29/20 0349 10/30/20 0421  WBC 4.7 2.1* 4.5 4.7 7.5  NEUTROABS 3.4 1.3* 2.4 2.9 4.6  HGB 7.9* 8.7* 8.1* 7.2* 8.4*  HCT 28.6* 30.7* 27.2* 24.9* 28.6*  MCV 89.1 89.0 85.8 86.5 87.2  PLT 165 196 242 243 316   Cardiac Enzymes: Recent Labs  Lab 10/25/20 1915  CKTOTAL 44   BNP (last 3 results) No results for input(s): PROBNP in the last 8760 hours. CBG: Recent Labs  Lab 10/30/20 1241 10/30/20 1707 10/30/20 2136 10/31/20 0812 10/31/20 1158  GLUCAP 123* 124* 122* 80 102*   D-Dimer: Recent Labs    10/29/20 0349 10/30/20 0421  DDIMER 4.84* 3.50*   Hgb A1c: No results for input(s): HGBA1C in the last 72 hours. Lipid Profile: No results for input(s): CHOL, HDL, LDLCALC, TRIG, CHOLHDL, LDLDIRECT in the last 72 hours. Thyroid function studies: No results for input(s): TSH, T4TOTAL, T3FREE, THYROIDAB in the last 72 hours.  Invalid input(s): FREET3 Anemia work up: No results for input(s): VITAMINB12, FOLATE, FERRITIN, TIBC, IRON, RETICCTPCT in the last 72 hours. Sepsis Labs: Recent Labs  Lab 10/25/20 1130 10/25/20 1450 10/25/20 1915 10/26/20 0504 10/27/20 0611 10/28/20 0601 10/29/20 0349 10/30/20 0421  PROCALCITON  --   --  73.80  --   --   --   --   --   WBC 5.0  --   --    < > 2.1* 4.5 4.7 7.5  LATICACIDVEN 4.9* 4.2*  --   --   --   --   --   --    < > = values in this interval not displayed.    Microbiology Recent Results (from the past 240 hour(s))  Culture, blood (Routine x 2)     Status: None   Collection Time: 10/25/20  2:49 PM  Specimen: BLOOD  Result Value Ref Range Status   Specimen Description BLOOD BLOOD LEFT HAND  Final   Special Requests   Final    BOTTLES DRAWN AEROBIC AND ANAEROBIC Blood Culture results may not be optimal due to an inadequate volume of blood received in culture bottles   Culture   Final    NO GROWTH 5  DAYS Performed at [facility], [address], [place], [place] [address]    Report Status 10/30/2020 FINAL  Final  Culture, blood (Routine x 2)     Status: None   Collection Time: 10/25/20  3:29 PM   Specimen: BLOOD  Result Value Ref Range Status   Specimen Description BLOOD BLOOD LEFT HAND  Final   Special Requests   Final    BOTTLES DRAWN AEROBIC AND ANAEROBIC Blood Culture adequate volume   Culture   Final    NO GROWTH 5 DAYS Performed at [facility], [address] [place], [place] [address]    Report Status 10/30/2020 FINAL  Final  Resp Panel by RT-PCR (Flu A&B, Covid) Nasopharyngeal Swab     Status: Abnormal   Collection Time: 10/25/20  3:30 PM   Specimen: Nasopharyngeal Swab; Nasopharyngeal(NP) swabs in vial transport medium  Result Value Ref Range Status   SARS Coronavirus 2 by RT PCR POSITIVE (A) NEGATIVE Final    Comment: RESULT CALLED TO, READ BACK BY AND VERIFIED WITH: laura hernandez 10/25/20 at 1641 by acr (NOTE) SARS-CoV-2 target nucleic acids are DETECTED.  The SARS-CoV-2 RNA is generally detectable in upper respiratory specimens during the acute phase of infection. Positive results are indicative of the presence of the identified virus, but do not rule out bacterial infection or co-infection with other pathogens not detected by the test. Clinical correlation with patient history and other diagnostic information is necessary to determine patient infection status. The expected result is Negative.  Fact Sheet for Patients: EntrepreneurPulse.com.au  Fact Sheet for Healthcare Providers: IncredibleEmployment.be  This test is not yet approved or cleared by the [place] FDA and  has been authorized for detection and/or diagnosis of SARS-CoV-2 by FDA under an Emergency Use Authorization (EUA).  This EUA will remain in effect (meaning this test c an be used) for the duration of  the COVID-19 declaration  under Section 564(b)(1) of the Act, 21 U.S.C. section 360bbb-3(b)(1), unless the authorization is terminated or revoked sooner.     Influenza A by PCR NEGATIVE NEGATIVE Final   Influenza B by PCR NEGATIVE NEGATIVE Final    Comment: (NOTE) The Xpert Xpress SARS-CoV-2/FLU/RSV plus assay is intended as an aid in the diagnosis of influenza from Nasopharyngeal swab specimens and should not be used as a sole basis for treatment. Nasal washings and aspirates are unacceptable for Xpert Xpress SARS-CoV-2/FLU/RSV testing.  Fact Sheet for Patients: EntrepreneurPulse.com.au  Fact Sheet for Healthcare Providers: IncredibleEmployment.be  This test is not yet approved or cleared by the [place] FDA and has been authorized for detection and/or diagnosis of SARS-CoV-2 by FDA under an Emergency Use Authorization (EUA). This EUA will remain in effect (meaning this test can be used) for the duration of the COVID-19 declaration under Section 564(b)(1) of the Act, 21 U.S.C. section 360bbb-3(b)(1), unless the authorization is terminated or revoked.  Performed at [facility], [address] [place], [place] [address]     Procedures and diagnostic studies:  No results found.             LOS: 5 days  Noel Henandez  [employer], advertising on [email or web address]. If 7PM-7AM, please contact night-coverage at [email or web address]     10/31/2020, 2:46 PM

## 2020-11-01 DIAGNOSIS — E43 Unspecified severe protein-calorie malnutrition: Secondary | ICD-10-CM | POA: Diagnosis not present

## 2020-11-01 DIAGNOSIS — C50919 Malignant neoplasm of unspecified site of unspecified female breast: Secondary | ICD-10-CM | POA: Diagnosis not present

## 2020-11-01 DIAGNOSIS — R630 Anorexia: Secondary | ICD-10-CM | POA: Diagnosis not present

## 2020-11-01 DIAGNOSIS — R627 Adult failure to thrive: Secondary | ICD-10-CM | POA: Diagnosis not present

## 2020-11-01 DIAGNOSIS — D638 Anemia in other chronic diseases classified elsewhere: Secondary | ICD-10-CM | POA: Diagnosis not present

## 2020-11-01 DIAGNOSIS — I82411 Acute embolism and thrombosis of right femoral vein: Secondary | ICD-10-CM | POA: Diagnosis not present

## 2020-11-01 LAB — GLUCOSE, CAPILLARY
Glucose-Capillary: 73 mg/dL (ref 70–99)
Glucose-Capillary: 79 mg/dL (ref 70–99)
Glucose-Capillary: 124 mg/dL — ABNORMAL HIGH (ref 70–99)
Glucose-Capillary: 101 mg/dL — ABNORMAL HIGH (ref 70–99)

## 2020-11-01 MED ORDER — PREDNISONE 20 MG PO TABS
20.0000 mg | ORAL_TABLET | Freq: Every day | ORAL | Status: AC
  Administered 2020-11-02: 10:00:00 20 mg via ORAL
  Filled 2020-11-01: qty 1

## 2020-11-01 MED ORDER — PREDNISONE 10 MG PO TABS
10.0000 mg | ORAL_TABLET | Freq: Every day | ORAL | Status: AC
  Administered 2020-11-03 – 2020-11-05 (×3): 10 mg via ORAL
  Filled 2020-11-01 (×3): qty 1

## 2020-11-01 NOTE — Progress Notes (Addendum)
Occupational Therapy Treatment Patient Details Name: Jessica Willis MRN: [id number] DOB: 07/29/1966 Today's Date: 11/01/2020    History of present illness Pt is a 55 y.o. female presenting to hospital 3/15 with R leg paresthesias and discomfort (pt fell on R side after coming home from hospital and developed symptoms after fall).  Imaging (+) extensive acute DVT R LE extending from proximal R femoral vein through veins of the calf.  Pt admitted with FTT.  (+) COVID-19.  PMH includes anemia, invasive R breast CA, on chronic O2, chemotherapy, PNA, bone metastasis, R arm weakness, tachycardia, metastatic breast CA, VATS.   OT comments  Ms Leeson was seen for OT/PT co-treatment on this date. Upon arrival to room pt reclined in bed, large BM noted to be covering entire purewick - pt states she is not aware. Pt agreeable to [facility] trial. Pt requires MAX A don B socks at bed level. MAX A + MIN A from +2 bed>BSC decreasing to MAX A x2 BSC>bed as pt fatigued. MIN A for L+R rolling, MAX A for perihygiene at bed level. MOD A bathing bed level. Of note, pt's sacral wounds noted to be worse than previous session on 3/18 - RN notified and in room to assess. Pt making progress toward goals. Pt continues to benefit from skilled OT services to maximize return to PLOF and minimize risk of future falls, injury, caregiver burden, and readmission. Will continue to follow POC. Discharge recommendation remains appropriate.    Follow Up Recommendations  SNF    Equipment Recommendations  None recommended by OT    Recommendations for Other Services      Precautions / Restrictions Precautions Precautions: Fall Precaution Comments: Metastatic breast CA with bone metastasis; L chest port Restrictions Weight Bearing Restrictions: No       Mobility Bed Mobility Overal bed mobility: Needs Assistance Bed Mobility: Rolling;Supine to Sit;Sit to Supine Rolling: Min assist   Supine to sit: Supervision;HOB  elevated Sit to supine: Mod assist;HOB elevated   General bed mobility comments: increased time for pt to perform semi-supine to sitting edge of bed on own; mod assist for LE's sit to semi-supine in bed    Transfers Overall transfer level: Needs assistance Equipment used: None Transfers: Stand Pivot Transfers Sit to Stand: +2 physical assistance;Max assist         General transfer comment: max assist x1 plus min assist of 2nd stand pivot transfer bed to [facility]; max assist x2 stand pivot transfer BSC to bed; vc's for technique; pt unable to perform partial stand with 1 assist (use of UE's on BSC arms) for clean-up on BSC    Balance Overall balance assessment: Needs assistance Sitting-balance support: Single extremity supported;Feet supported Sitting balance-Leahy Scale: Good Sitting balance - Comments: steady sitting with single UE support on bed                                   ADL either performed or assessed with clinical judgement   ADL Overall ADL's : Needs assistance/impaired                                       General ADL Comments: MAX A don B socks at bed level. MIN A for L+R rolling, MAX A for perihygiene at bed level. MOD A x2 for BSC t/f decreasing to MAX A  x2 as pt fatigued. MOD A bathing bed level               Cognition Arousal/Alertness: Awake/alert Behavior During Therapy: Flat affect Overall Cognitive Status: Within Functional Limits for tasks assessed                                 General Comments: Pt tends to avoid eye contact        Exercises Exercises: Other exercises Other Exercises Other Exercises: Pt educated YN:[id number] of Q2 turns for skin protection, d/c recs, falls prevention, ECS, education on importance of mobility for funcitonal strengthening Other Exercises: LBD, toileting, sup<>sit, sit<>stand, SPT, rolling, sitting balance/tolerance, bathing   Shoulder Instructions       General  Comments wounds noted on pt's bottom--RN came in to address wounds    Pertinent Vitals/ Pain       Pain Assessment: Faces Faces Pain Scale: Hurts whole lot Pain Location: peri wounds during wiping Pain Descriptors / Indicators: Burning Pain Intervention(s): Limited activity within patient's tolerance;Monitored during session;Repositioned         Frequency  Min 1X/week        Progress Toward Goals  OT Goals(current goals can now be found in the care plan section)  Progress towards OT goals: Progressing toward goals  Acute Rehab OT Goals Patient Stated Goal: To return home OT Goal Formulation: With patient Time For Goal Achievement: 11/11/20 Potential to Achieve Goals: Good ADL Goals Pt Will Perform Grooming: with modified independence;sitting Pt Will Perform Lower Body Dressing: with min assist;sitting/lateral leans Pt Will Transfer to Toilet: with min assist;bedside commode;stand pivot transfer Additional ADL Goal #1: Pt will Independenly verbalize plan to implement x3 ECS  Plan Discharge plan remains appropriate;Frequency remains appropriate    Co-evaluation    PT/OT/SLP Co-Evaluation/Treatment: Yes Reason for Co-Treatment: For patient/therapist safety;To address functional/ADL transfers PT goals addressed during session: Mobility/safety with mobility OT goals addressed during session: ADL's and self-care      AM-PAC OT "6 Clicks" Daily Activity     Outcome Measure   Help from another person eating meals?: None Help from another person taking care of personal grooming?: A Little Help from another person toileting, which includes using toliet, bedpan, or urinal?: A Lot Help from another person bathing (including washing, rinsing, drying)?: A Lot Help from another person to put on and taking off regular upper body clothing?: A Little Help from another person to put on and taking off regular lower body clothing?: A Lot 6 Click Score: 16    End of Session     OT Visit Diagnosis: Unsteadiness on feet (R26.81);Repeated falls (R29.6);Muscle weakness (generalized) (M62.81)   Activity Tolerance Patient limited by fatigue   Patient Left in bed;with call bell/phone within reach;with bed alarm set;with nursing/sitter in room   Nurse Communication Mobility status        Time: 6578-4696 OT Time Calculation (min): 60 min  Charges: OT General Charges $OT Visit: 1 Visit OT Treatments $Self Care/Home Management : 23-37 mins  Dessie Coma, M.S. OTR/L  11/01/20, 4:33 PM  ascom [phone]

## 2020-11-01 NOTE — Progress Notes (Signed)
.   Hematology/Oncology Progress Note [facility] [phone] [phone]  Patient Care Team: [place] as PCP - General Rico Junker, RN as Registered Nurse Benjamine Sprague, DO as Consulting Physician (Surgery)   Name of the patient: Jessica Willis  [id number]  [birth year]  Date of visit: 11/01/20   INTERVAL HISTORY-  Patient was seen at the bedside.  No acute overnight issues. On 3 L of nasal cannula oxygen, breathing status is a stable. Poor appetite.  She is not eating much.    Review of systems- Review of Systems  Constitutional: Positive for appetite change and fatigue. Negative for chills and fever.  HENT:   Negative for hearing loss and voice change.   Eyes: Negative for eye problems.  Respiratory: Negative for chest tightness, cough and shortness of breath.   Cardiovascular: Negative for chest pain.  Gastrointestinal: Negative for abdominal distention, abdominal pain and blood in stool.  Endocrine: Negative for hot flashes.  Genitourinary: Negative for difficulty urinating and frequency.   Musculoskeletal: Negative for arthralgias.  Skin: Negative for itching and rash.  Neurological: Negative for extremity weakness.  Hematological: Negative for adenopathy.  Psychiatric/Behavioral: Negative for confusion.    No Known Allergies  Patient Active Problem List   Diagnosis Date Noted  . Pressure injury of skin 10/27/2020  . Protein-calorie malnutrition, severe 10/26/2020  . Hypernatremia 10/26/2020  . Acute deep vein thrombosis (DVT) of right femoral vein ([place]) 10/26/2020  . Weakness 10/25/2020  . Failure to thrive in adult 10/25/2020  . Dehydration 10/25/2020  . COVID-19 virus infection 10/25/2020  . Chemotherapy-induced nausea   . Poor appetite   . Pneumonia due to infectious organism 10/07/2020  . QT prolongation 09/22/2020  . Pneumonia due to Streptococcus pyogenes ([place]) 08/22/2020  .  Genetic testing 07/27/2020  . Pancreatic lesion 07/22/2020  . Swelling of lower leg 07/13/2020  . Encounter for antineoplastic chemotherapy 07/04/2020  . Complication of chemotherapy 07/04/2020  . Family history of breast cancer   . Port-A-Cath in place   . Bone metastasis ([place])   . Hypocalcemia   . Folate deficiency   . Gastroesophageal reflux disease without esophagitis   . Weakness of right arm   . Palliative care encounter   . Iron deficiency anemia   . Anemia, chronic disease   . Tachycardia   . Metastatic breast cancer ([place])   . Malignant pleural effusion   . Chest tube in place   . Goals of care, counseling/discussion   . Hemothorax on right 06/16/2020  . Acute respiratory failure with hypoxia ([place])   . Breast mass, right 06/15/2020  . Mastitis 06/15/2020  . Pleural effusion 06/15/2020  . Intestinal obstruction ([place])   . Leukocytosis   . Hypokalemia 06/24/2015  . Sepsis ([place]) 06/23/2015  . CAP (community acquired pneumonia) 06/23/2015  . Partial small bowel obstruction ([place]) 06/23/2015     Past Medical History:  Diagnosis Date  . Anemia   . Family history of breast cancer   . Patient denies medical problems      Past Surgical History:  Procedure Laterality Date  . BREAST BIOPSY  06/28/2020   Procedure: BREAST BIOPSY;  Surgeon: Benjamine Sprague, DO;  Location: [facility] ORS;  Service: General;;  . PORTACATH PLACEMENT N/A 06/28/2020   Procedure: INSERTION PORT-A-CATH;  Surgeon: Benjamine Sprague, DO;  Location: [facility] ORS;  Service: General;  Laterality: N/A;  . TUBAL LIGATION    . VIDEO ASSISTED THORACOSCOPY (VATS)/THOROCOTOMY Right 06/23/2020   Procedure:  ATTEMPTED VIDEO ASSISTED THORACOSCOPY (VATS);  Surgeon: Nestor Lewandowsky, MD;  Location: [facility] ORS;  Service: General;  Laterality: Right;    Social History   Socioeconomic History  . Marital status: Single    Spouse name: Not on file  . Number of children: Not on file  . Years of education: Not on file  . Highest  education level: Not on file  Occupational History  . Not on file  Tobacco Use  . Smoking status: Never Smoker  . Smokeless tobacco: Never Used  Vaping Use  . Vaping Use: Never used  Substance and Sexual Activity  . Alcohol use: Not Currently    Alcohol/week: 1.0 standard drink    Types: 1 Cans of beer per week  . Drug use: No  . Sexual activity: Not on file  Other Topics Concern  . Not on file  Social History Narrative  . Not on file   Social Determinants of Health   Financial Resource Strain: Not on file  Food Insecurity: Not on file  Transportation Needs: Not on file  Physical Activity: Not on file  Stress: Not on file  Social Connections: Not on file  Intimate Partner Violence: Not on file     Family History  Problem Relation Age of Onset  . Diabetes Other   . Hypertension Other   . Diabetes Maternal Aunt   . Breast cancer Cousin        dx 59s  . Breast cancer Cousin        dx 31s     Current Facility-Administered Medications:  .  albuterol (PROVENTIL) (2.5 MG/3ML) 0.083% nebulizer solution 2.5 mg, 2.5 mg, Nebulization, Q6H PRN, Cox, Amy N, DO .  albuterol (VENTOLIN HFA) 108 (90 Base) MCG/ACT inhaler 2 puff, 2 puff, Inhalation, Q4H PRN, Cox, Amy N, DO .  apixaban (ELIQUIS) tablet 10 mg, 10 mg, Oral, BID, 10 mg at 11/01/20 1029 **FOLLOWED BY** [START ON 11/02/2020] apixaban (ELIQUIS) tablet 5 mg, 5 mg, Oral, BID, Jennye Boroughs, MD .  ascorbic acid (VITAMIN C) tablet 500 mg, 500 mg, Oral, Daily, Cox, Amy N, DO, 500 mg at 11/01/20 1029 .  calcium carbonate (OS-CAL - dosed in mg of elemental calcium) tablet 1,250 mg, 1,250 mg, Oral, Q breakfast, Cox, Amy N, DO, 1,250 mg at 10/31/20 0854 .  Chlorhexidine Gluconate Cloth 2 % PADS 6 each, 6 each, Topical, Daily, Jennye Boroughs, MD, 6 each at 10/31/20 [id number] .  chlorpheniramine-HYDROcodone (TUSSIONEX) 10-8 MG/5ML suspension 5 mL, 5 mL, Oral, Q12H PRN, Cox, Amy N, DO .  dronabinol (MARINOL) capsule 5 mg, 5 mg, Oral, BID AC,  Cox, Amy N, DO, 5 mg at 10/31/20 1726 .  feeding supplement (ENSURE ENLIVE / ENSURE PLUS) liquid 237 mL, 237 mL, Oral, QID, Jennye Boroughs, MD, 237 mL at 11/01/20 1028 .  guaiFENesin-dextromethorphan (ROBITUSSIN DM) 100-10 MG/5ML syrup 10 mL, 10 mL, Oral, Q4H PRN, Cox, Amy N, DO .  insulin aspart (novoLOG) injection 0-5 Units, 0-5 Units, Subcutaneous, QHS, Cox, Amy N, DO .  insulin aspart (novoLOG) injection 0-9 Units, 0-9 Units, Subcutaneous, TID WC, Cox, Amy N, DO, 1 Units at 10/30/20 1732 .  magic mouthwash w/lidocaine, 15 mL, Oral, TID, Cox, Amy N, DO, 15 mL at 10/31/20 2024 .  magnesium chloride (SLOW-MAG) 64 MG SR tablet 64 mg, 1 tablet, Oral, Daily, Cox, Amy N, DO, 64 mg at 10/31/20 0853 .  metoprolol tartrate (LOPRESSOR) tablet 50 mg, 50 mg, Oral, BID, Cox, Amy N, DO, 50 mg at  11/01/20 1029 .  morphine 4 MG/ML injection 4 mg, 4 mg, Intravenous, Q4H PRN, Cox, Amy N, DO, 4 mg at 10/28/20 1853 .  multivitamin with minerals tablet 1 tablet, 1 tablet, Oral, Daily, Cox, Amy N, DO, 1 tablet at 11/01/20 1029 .  ondansetron (ZOFRAN) injection 4 mg, 4 mg, Intravenous, Q8H PRN, Cox, Amy N, DO .  oxyCODONE-acetaminophen (PERCOCET) 7.5-325 MG per tablet 1 tablet, 1 tablet, Oral, Q8H PRN, Cox, Amy N, DO, 1 tablet at 10/25/20 1900 .  polyethylene glycol (MIRALAX / GLYCOLAX) packet 17 g, 17 g, Oral, BID, Cox, Amy N, DO, 17 g at 10/28/20 2157 .  predniSONE (DELTASONE) tablet 20 mg, 20 mg, Oral, Q breakfast, Earlie Server, MD, 20 mg at 11/01/20 1029 .  sodium chloride flush (NS) 0.9 % injection 10 mL, 10 mL, Intravenous, PRN, Jennye Boroughs, MD, 10 mL at 10/30/20 1346 .  zinc sulfate capsule 220 mg, 220 mg, Oral, Daily, Cox, Amy N, DO, 220 mg at 11/01/20 1029   Physical exam:  Vitals:   11/01/20 0100 11/01/20 0524 11/01/20 0818 11/01/20 1136  BP: 116/65 113/66 116/60 119/67  Pulse: (!) 108 (!) 112 (!) 112 (!) 115  Resp: 18 20 18 17   Temp: 98.4 F (36.9 C) 99.1 F (37.3 C) 98.9 F (37.2 C) 97.6 F (36.4  C)  TempSrc: Oral Oral Oral   SpO2: 100% 99% 100% 100%  Weight:      Height:       Physical Exam Constitutional:      General: She is not in acute distress.    Appearance: She is not diaphoretic.     Comments: Cachectic  HENT:     Head: Normocephalic and atraumatic.     Nose: Nose normal.     Mouth/Throat:     Pharynx: No oropharyngeal exudate.  Eyes:     General: No scleral icterus.    Pupils: Pupils are equal, round, and reactive to light.  Cardiovascular:     Rate and Rhythm: Normal rate and regular rhythm.     Heart sounds: No murmur heard.   Pulmonary:     Effort: Pulmonary effort is normal. No respiratory distress.     Breath sounds: No rales.  Chest:     Chest wall: No tenderness.  Abdominal:     General: There is no distension.     Palpations: Abdomen is soft.     Tenderness: There is no abdominal tenderness.  Musculoskeletal:        General: Normal range of motion.     Cervical back: Normal range of motion and neck supple.  Skin:    General: Skin is warm and dry.     Findings: No erythema.  Neurological:     Mental Status: She is alert and oriented to person, place, and time.     Cranial Nerves: No cranial nerve deficit.     Motor: No abnormal muscle tone.     Coordination: Coordination normal.  Psychiatric:        Mood and Affect: Affect normal.   Right breast mass with chronic drainage covered with dressing    CMP Latest Ref Rng & Units 10/30/2020  Glucose 70 - 99 mg/dL 88  BUN 6 - 20 mg/dL 24(H)  Creatinine 0.44 - 1.00 mg/dL 0.40(L)  Sodium 135 - 145 mmol/L 144  Potassium 3.5 - 5.1 mmol/L 4.2  Chloride 98 - 111 mmol/L 105  CO2 22 - 32 mmol/L 32  Calcium 8.9 - 10.3 mg/dL 8.1(L)  Total Protein 6.5 - 8.1 g/dL 5.1(L)  Total Bilirubin 0.3 - 1.2 mg/dL 0.7  Alkaline Phos 38 - 126 U/L 288(H)  AST 15 - 41 U/L 66(H)  ALT 0 - 44 U/L 17   CBC Latest Ref Rng & Units 10/30/2020  WBC 4.0 - 10.5 K/uL 7.5  Hemoglobin 12.0 - 15.0 g/dL 8.4(L)  Hematocrit  36.0 - 46.0 % 28.6(L)  Platelets 150 - 400 K/uL 316    RADIOGRAPHIC STUDIES: I have personally reviewed the radiological images as listed and agreed with the findings in the report. DG Chest 2 View  Result Date: 10/07/2020 CLINICAL DATA:  Hypoxia, shortness of breath, metastatic breast cancer EXAM: CHEST - 2 VIEW COMPARISON:  09/13/2020 FINDINGS: LEFT subclavian Port-A-Cath with tip projecting over RIGHT atrium. Enlargement of cardiac silhouette. Pulmonary vascular congestion. Persistent enlargement of RIGHT hilum question mass or adenopathy. Increased infiltrate of the mid to lower RIGHT lung. Small RIGHT pleural effusion again seen. Accentuation of markings in LEFT perihilar region little changed. No pneumothorax or acute osseous findings. IMPRESSION: Increased RIGHT perihilar infiltrate with persistent small RIGHT pleural effusion. Enlargement of RIGHT pulmonary hilum by adenopathy versus perihilar mass. Electronically Signed   By: Lavonia Dana M.D.   On: 10/07/2020 11:32   DG Lumbar Spine Complete  Result Date: 10/25/2020 CLINICAL DATA:  Pain following fall EXAM: LUMBAR SPINE - COMPLETE 4+ VIEW COMPARISON:  None. FINDINGS: Frontal, lateral, spot lumbosacral lateral, and bilateral oblique views were obtained. There are 5 non-rib-bearing lumbar type vertebral bodies. There is no fracture or spondylolisthesis. The disc spaces appear normal. There is no appreciable facet arthropathy. IMPRESSION: No fracture or spondylolisthesis.  No evident arthropathy. Electronically Signed   By: Lowella Grip III M.D.   On: 10/25/2020 14:08   CT Angio Chest PE W and/or Wo Contrast  Result Date: 10/07/2020 CLINICAL DATA:  Breast carcinoma. Tachycardia and shortness of breath EXAM: CT ANGIOGRAPHY CHEST WITH CONTRAST TECHNIQUE: Multidetector CT imaging of the chest was performed using the standard protocol during bolus administration of intravenous contrast. Multiplanar CT image reconstructions and MIPs were  obtained to evaluate the vascular anatomy. CONTRAST:  68mL OMNIPAQUE IOHEXOL 350 MG/ML SOLN COMPARISON:  CT angiogram chest June 15, 2020; chest radiograph October 07, 2020 FINDINGS: Cardiovascular: There is no evident pulmonary embolus. No appreciable thoracic aortic aneurysm or dissection. Visualized great vessels appear unremarkable. There is no appreciable pericardial effusion or pericardial thickening. Port-A-Cath tip is in the superior vena cava near the cavoatrial junction. Mediastinum/Nodes: Thyroid appears normal. There are multiple axillary lymph nodes bilaterally, more severe on the right than on the left. The largest lymph node is seen in the right axillary region measuring 2.7 x 2.1 cm. There is adenopathy in the right supraclavicular and infraclavicular regions with extension of adenopathy into the right Peri carinal region. Largest individual lymph node in these areas measures 2.6 x 1.8 cm. Several left supraclavicular lymph nodes are also noted, largest measuring 1.7 x 1.3 cm. There are several subcentimeter mediastinal lymph nodes. There is extensive retrocrural adenopathy. The largest lymph node or collection of matted lymph nodes is seen inferior to the aorta on the left posteriorly measuring 3.4 x 2.1 cm. No esophageal lesions are evident. Lungs/Pleura: There is extensive airspace consolidation in portions of the right middle and lower lobes with interspersed areas of loculated pleural effusion. There is ill-defined airspace opacity consistent with pneumonia in the left upper lobe. Loculated fluid tracks along the left major fissure. There may well be associated metastasis within  the fissure given the extensive nodularity in this area. There is a small left pleural effusion with left base atelectasis. Pleural metastases noted along each upper hemithorax, primarily posteriorly as well as in the right apex. Upper Abdomen: Retrocrural adenopathy extends into the upper abdominal region.  Visualized upper abdominal structures otherwise appear unremarkable. Musculoskeletal: There is a large mass occupying the right breast and chest wall region with invasion of the deep muscles of the chest wall on the right. There is also invasion of the right axilla. There is extensive subcutaneous thickening in both breast regions. There is a mass in the lateral left breast with extension of soft tissue prominence along the lateral left hemithorax, likely due to extension of neoplasm into the adjacent soft tissues. There is widespread sclerotic bony metastatic disease throughout the thoracic region. Review of the MIP images confirms the above findings. IMPRESSION: 1. No demonstrable pulmonary embolus. No thoracic aortic aneurysm or dissection. 2. Areas of airspace consolidation noted in the right middle and lower lobes. More ill-defined infiltrate is noted in the left upper lobe anteriorly. Areas of loculated fluid noted in the left major fissure. Nodularity in this area could indicate superimposed masses in left major fissure. There is pleural thickening along both posterior upper hemithorax regions, likely pleural metastases. Loculated fluid noted in areas of infiltrate on the right. Small left pleural effusion noted. 3. Adenopathy at multiple sites, most severe in the retrocrural regions bilaterally, right supraclavicular region, and right axillary region, although there is left axillary and left supraclavicular adenopathy. 4. Large mass lesions occupying the right breast with extension and invasion of the chest wall and right axillary regions. A lesser degree of metastatic disease to the left of midline in the subcutaneous tissues and lateral left breast/lateral left chest wall soft tissues noted. 5. Multifocal sclerotic bony metastatic disease throughout the thoracic region noted. Electronically Signed   By: Lowella Grip III M.D.   On: 10/07/2020 13:23   MR BRAIN W WO CONTRAST  Addendum Date: 10/26/2020    ADDENDUM REPORT: 10/26/2020 13:14 ADDENDUM: The conclusion mentions that breast cancer is thought unlikely for the orbital findings. On further review, given the patient's widespread metastatic disease and given that breast cancer does have a propensity for orbital metastasis (occasionally multifocal/bilateral), metastatic breast cancer is not excluded. The additional differential considerations mentioned in the conclusion remain possibilities, particularly orbital lymphoma given the restricted diffusion. Consider ophthalmology consultation. Findings discussed with Dr. Jennye Boroughs via telephone at 1:10 pm. Electronically Signed   By: Margaretha Sheffield MD   On: 10/26/2020 13:14   Result Date: 10/26/2020 CLINICAL DATA:  Neuro deficit, acute stroke suspected. Right leg paresthesias and discomfort. EXAM: MRI HEAD WITHOUT AND WITH CONTRAST TECHNIQUE: Multiplanar, multiecho pulse sequences of the brain and surrounding structures were obtained without and with intravenous contrast. CONTRAST:  78mL GADAVIST GADOBUTROL 1 MMOL/ML IV SOLN COMPARISON:  MRI November 12 21. FINDINGS: Brain: No acute infarction, hemorrhage, hydrocephalus, extra-axial collection or intracranial mass lesion. No abnormal intracranial enhancement. Vascular: Major arterial flow voids are maintained at the skull base. Skull and upper cervical spine: Increased size/conspicuity of scattered T1 hypointense metastasis in the upper cervical spine. Additionally, increased size of the clival skull metastasis, now measuring 1.4 cm. T1 hypointensity of the right occipital calvarium appears similar to prior and is concerning for osseous metastatic disease. Sinuses/Orbits: Interval development of marked enlargement of multiple extraocular muscles, including bulky enlargement of the left medial and lateral rectus muscles and right lateral rectus muscle. Additionally,  there is evidence of involvement of the right medial rectus muscle anteriorly (series 17,  image 26 and series 5, image 19), superior rectus musculature anteriorly bilaterally (series 17, 26; series 5, image 22) and the inferior rectus muscle on the right posteriorly (series 17, image 24; series 5, image 16). The enlarged extra-ocular muscles demonstrate T2 hypointensity and restricted diffusion, suggesting high cellularity. There is involvement of the muscle bodies as well as tendinous insertions in areas. Other: No sizable mastoid effusions. IMPRESSION: 1. No evidence of acute infarct or intracranial brain metastasis. 2. Interval development of marked enlargement of multiple bilateral extraocular muscles, detailed above and most prominently involving the medial and lateral rectus. The enlarged extra-ocular muscles demonstrate T2 hypointensity and restricted diffusion, suggesting high cellularity. While nonspecific, this finding can be seen with orbital lymphoma. Idiopathic orbital inflammatory pseudotumor and IgG4-related orbital disease are additional differential considerations. Metastatic breast cancer was considered given the clinical history, but thought unlikely given the diffuse nature of the process. 3. Partially imaged metastases in the upper cervical spine and clival metastasis appear larger and more conspicuous, concerning for progression of bony metastatic disease. Similar T1 hypointensity right occipital calvarium, concerning for an additional site of osseous metastatic disease. Findings discussed with Dr. Jaclynn Guarneri at 5:52 p.m. via telephone. Electronically Signed: By: Margaretha Sheffield MD On: 10/25/2020 18:17   US Venous Img Lower Unilateral Right (DVT)  Result Date: 10/25/2020 CLINICAL DATA:  Initial evaluation for acute right lower extremity pain and swelling. EXAM: RIGHT LOWER EXTREMITY VENOUS DOPPLER ULTRASOUND TECHNIQUE: Gray-scale sonography with graded compression, as well as color Doppler and duplex ultrasound were performed to evaluate the lower extremity deep venous systems  from the level of the common femoral vein and including the common femoral, femoral, profunda femoral, popliteal and calf veins including the posterior tibial, peroneal and gastrocnemius veins when visible. The superficial great saphenous vein was also interrogated. Spectral Doppler was utilized to evaluate flow at rest and with distal augmentation maneuvers in the common femoral, femoral and popliteal veins. COMPARISON:  None. FINDINGS: Contralateral Common Femoral Vein: Respiratory phasicity is normal and symmetric with the symptomatic side. No evidence of thrombus. Normal compressibility. Common Femoral Vein: No evidence of thrombus. Normal compressibility, respiratory phasicity and response to augmentation. Saphenofemoral Junction: Echogenic thrombus seen at the saphenofemoral junction. Profunda Femoral Vein: No evidence of thrombus. Normal compressibility and flow on color Doppler imaging. Femoral Vein: Echogenic partially occlusive thrombus seen throughout the proximal, mid, and distal right femoral vein. Popliteal Vein: Echogenic occlusive thrombus seen throughout the right popliteal vein. Calf Veins: Echogenic occlusive thrombus seen within the right peroneal and posterior tibial veins. Superficial Great Saphenous Vein: No evidence of thrombus. Normal compressibility. Venous Reflux:  None. Other Findings:  None. IMPRESSION: Positive study with extensive acute DVT extending from the proximal right femoral vein through the veins of the calf. Electronically Signed   By: Jeannine Boga M.D.   On: 10/25/2020 18:40   US Venous Img Upper Uni Right(DVT)  Result Date: 10/08/2020 CLINICAL DATA:  History of stage IV breast cancer now with right upper extremity pain and edema. Evaluate for DVT. EXAM: RIGHT UPPER EXTREMITY VENOUS DOPPLER ULTRASOUND TECHNIQUE: Gray-scale sonography with graded compression, as well as color Doppler and duplex ultrasound were performed to evaluate the upper extremity deep venous  system from the level of the subclavian vein and including the jugular, axillary, basilic, radial, ulnar and upper cephalic vein. Spectral Doppler was utilized to evaluate flow at rest and with distal augmentation maneuvers.  COMPARISON:  None. FINDINGS: Contralateral Subclavian Vein: Respiratory phasicity is normal and symmetric with the symptomatic side. No evidence of thrombus. Normal compressibility. Internal Jugular Vein: No evidence of thrombus. Normal compressibility, respiratory phasicity and response to augmentation. Subclavian Vein: No evidence of thrombus. Normal compressibility, respiratory phasicity and response to augmentation. Axillary Vein: Appears patent where imaged though evaluation degraded secondary to patient's discomfort with sonographic evaluation of the axilla. Cephalic Vein: No evidence of thrombus. Normal compressibility, respiratory phasicity and response to augmentation. Basilic Vein: No evidence of thrombus. Normal compressibility, respiratory phasicity and response to augmentation. Brachial Veins: No evidence of thrombus within either of the paired brachial veins. Normal compressibility, respiratory phasicity and response to augmentation. Radial Veins: No evidence of thrombus. Normal compressibility, respiratory phasicity and response to augmentation. Ulnar Veins: No evidence of thrombus. Normal compressibility, respiratory phasicity and response to augmentation. Venous Reflux:  None visualized. Other Findings: There is a moderate amount of subcutaneous edema at the level of the right upper arm. IMPRESSION: No evidence of DVT within the right upper extremity. Electronically Signed   By: Sandi Mariscal M.D.   On: 10/08/2020 14:29   DG Chest Port 1 View  Result Date: 10/25/2020 CLINICAL DATA:  Chest pain. EXAM: PORTABLE CHEST 1 VIEW COMPARISON:  Single-view of the chest 10/15/2020 and 10/13/2020. CT chest 10/07/2020. FINDINGS: Right pleural effusion and opacities projecting over the  right chest consistent with known chest wall and intrathoracic metastatic breast carcinoma again seen. Scattered opacities in the left chest are also again seen. Aeration has improved bilaterally since the most recent exam. Small right pleural effusion is noted. The left lung is clear. Heart size is normal. IMPRESSION: Aeration in both lungs appears improved compared to the most recent study in this patient with extensive metastatic breast cancer. No new abnormality. Electronically Signed   By: Inge Rise M.D.   On: 10/25/2020 12:19   DG Chest Port 1 View  Result Date: 10/15/2020 CLINICAL DATA:  Pneumonia and pleural effusion. Metastatic breast cancer. EXAM: PORTABLE CHEST 1 VIEW COMPARISON:  10/13/2020 chest radiograph and prior studies FINDINGS: Cardiomediastinal silhouette is unchanged with RIGHT mediastinal fullness. A LEFT subclavian Port-A-Cath with tip overlying the UPPER RIGHT atrium again noted. Scattered opacities within both lungs are again noted with a RIGHT pleural effusion. There is no evidence of pneumothorax. No significant changes are identified. IMPRESSION: Unchanged appearance of the chest with scattered opacities within both lungs and RIGHT pleural effusion. Electronically Signed   By: Margarette Canada M.D.   On: 10/15/2020 11:06   DG Chest Port 1 View  Result Date: 10/13/2020 CLINICAL DATA:  Pleural effusion EXAM: PORTABLE CHEST 1 VIEW COMPARISON:  10/07/2020 FINDINGS: Unchanged positioning of left-sided Port-A-Cath. Stable cardiomediastinal contours. Persistent small right pleural effusion. Slight progression of airspace opacity throughout the right lung most pronounced within the right lung base. Similar interstitial opacities throughout the left lung. No pneumothorax. IMPRESSION: 1. Slight progression of airspace opacity throughout the right lung, most pronounced within the right lung base. 2. Persistent small right pleural effusion. Electronically Signed   By: Davina Poke D.O.    On: 10/13/2020 14:10   ECHOCARDIOGRAM COMPLETE  Result Date: 10/10/2020    ECHOCARDIOGRAM REPORT   Patient Name:   MARGARETTE VANNATTER Date of Exam: 10/10/2020 Medical Rec #:  [id number]           Height:       70.0 in Accession #:    [id number]  Weight:       178.7 lb Date of Birth:  Dec 17, 1965           BSA:          1.990 m Patient Age:    32 years            BP:           119/79 mmHg Patient Gender: F                   HR:           105 bpm. Exam Location:  [facility] Procedure: 2D Echo, Limited Color Doppler and Cardiac Doppler Indications:     R06.03 Acute Respiratory Distress  History:         Patient has no prior history of Echocardiogram examinations.                  Signs/Symptoms:Shortness of Breath. Breast cancer.  Sonographer:     Charmayne Sheer RDCS (AE) Referring Phys:  [id number] Sharen Hones Diagnosing Phys: Kathlyn Sacramento MD  Sonographer Comments: Technically challenging study due to limited acoustic windows. IMPRESSIONS  1. Left ventricular ejection fraction, by estimation, is 50 to 55%. The left ventricle has low normal function. Left ventricular endocardial border not optimally defined to evaluate regional wall motion. Left ventricular diastolic parameters are indeterminate.  2. Right ventricular systolic function is normal. The right ventricular size is normal. Tricuspid regurgitation signal is inadequate for assessing PA pressure.  3. The mitral valve is normal in structure. No evidence of mitral valve regurgitation. No evidence of mitral stenosis.  4. The aortic valve is normal in structure. Aortic valve regurgitation is not visualized. No aortic stenosis is present.  5. The inferior vena cava is normal in size with greater than 50% respiratory variability, suggesting right atrial pressure of 3 mmHg. FINDINGS  Left Ventricle: Left ventricular ejection fraction, by estimation, is 50 to 55%. The left ventricle has low normal function. Left ventricular endocardial border not optimally defined to  evaluate regional wall motion. The left ventricular internal cavity  size was normal in size. There is no left ventricular hypertrophy. Left ventricular diastolic parameters are indeterminate. Right Ventricle: The right ventricular size is normal. No increase in right ventricular wall thickness. Right ventricular systolic function is normal. Tricuspid regurgitation signal is inadequate for assessing PA pressure. Left Atrium: Left atrial size was normal in size. Right Atrium: Right atrial size was normal in size. Pericardium: There is no evidence of pericardial effusion. Mitral Valve: The mitral valve is normal in structure. No evidence of mitral valve regurgitation. No evidence of mitral valve stenosis. MV peak gradient, 4.5 mmHg. The mean mitral valve gradient is 2.0 mmHg. Tricuspid Valve: The tricuspid valve is normal in structure. Tricuspid valve regurgitation is not demonstrated. No evidence of tricuspid stenosis. Aortic Valve: The aortic valve is normal in structure. Aortic valve regurgitation is not visualized. No aortic stenosis is present. Aortic valve mean gradient measures 4.0 mmHg. Aortic valve peak gradient measures 6.8 mmHg. Pulmonic Valve: The pulmonic valve was not well visualized. Pulmonic valve regurgitation is not visualized. No evidence of pulmonic stenosis. Aorta: The aortic root is normal in size and structure. Venous: The inferior vena cava is normal in size with greater than 50% respiratory variability, suggesting right atrial pressure of 3 mmHg. IAS/Shunts: No atrial level shunt detected by color flow Doppler.   LV Volumes (MOD) LV vol d, MOD A4C: 99.4 ml Diastology LV vol s, MOD A4C:  58.3 ml LV e' lateral:   10.30 cm/s LV SV MOD A4C:     99.4 ml LV E/e' lateral: 5.8  LEFT ATRIUM           Index LA Vol (A4C): 41.5 ml 20.86 ml/m  AORTIC VALVE AV Vmax:           130.00 cm/s AV Vmean:          93.400 cm/s AV VTI:            0.243 m AV Peak Grad:      6.8 mmHg AV Mean Grad:      4.0 mmHg LVOT  Vmax:         92.70 cm/s LVOT Vmean:        60.500 cm/s LVOT VTI:          0.151 m LVOT/AV VTI ratio: 0.62 MITRAL VALVE MV Area (PHT): 6.96 cm    SHUNTS MV Peak grad:  4.5 mmHg    Systemic VTI: 0.15 m MV Mean grad:  2.0 mmHg MV Vmax:       1.06 m/s MV Vmean:      67.9 cm/s MV Decel Time: 109 msec MV E velocity: 59.60 cm/s MV A velocity: 68.60 cm/s MV E/A ratio:  0.87 Kathlyn Sacramento MD Electronically signed by Kathlyn Sacramento MD Signature Date/Time: 10/10/2020/2:00:06 PM    Final    DG HIP UNILAT W OR W/O PELVIS 2-3 VIEWS RIGHT  Result Date: 10/25/2020 CLINICAL DATA:  History of metastatic breast cancer. Patient status post fall. Initial encounter. EXAM: DG HIP (WITH OR WITHOUT PELVIS) 2-3V RIGHT COMPARISON:  PET CT scan 07/19/2020. FINDINGS: No acute bony or joint abnormality is identified. Scattered sclerotic lesions are present in both hips and the pelvis consistent with known metastatic breast cancer. Soft tissues are negative. IMPRESSION: No acute abnormality. Scattered sclerotic lesions consistent with known metastatic breast cancer. Electronically Signed   By: Inge Rise M.D.   On: 10/25/2020 14:09    Assessment and plan-  #Acute right lower extremity proximal DVT  Thrombosis probably due to immobilization secondary to recent hospitalization, widely metastatic cancer, COVID-19 infection etc. continue Eliquis.   Check CBC in a.m.   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 10/14/2020, status post salvage second line palliative chemotherapy with Adriamycin.  She is currently in visceral crisis.  Prognosis is poor.   Discussed with patient again about CODE STATUS and goals of care. Patient desires additional treatments and she feels she is undecided about proceeding with hospice at this point. She wants more time to consider.  Discussed with palliative care service.   #Failure to thrive, severe protein calorie malnutrition. Continue nutrition  supplementation. Slow taper prednisone to 20 mg daily,   #Severe protein calorie malnutrition.    Marinol 5 mg twice daily..  Nutrition supplementation. #Chronic respiratory failure, continue albuterol nebulizer and inhaler.  Nasal cannula oxygen.  Wean as tolerated. #Tachycardia, on beta-blocker. #PT OT evaluation.  Likely need to go to rehab. #Right breast wound,decubitus ulcers of the right buttocks -wound care #?metastatic disease to extraocular muscles -ophthalmology evaluation #Covid infection,remdesivir.  Disposition: Patient will need to go to rehab facility. CODE STATUS, full code.  Thank you for allowing me to participate in the care of this patient.   Earlie Server, MD, PhD Hematology Oncology [facility] at [facility] Pager- [phone] 11/01/2020

## 2020-11-01 NOTE — Progress Notes (Signed)
Progress Note    Jessica Willis  OZH:[id number] DOB: 1966/04/15  DOA: 10/25/2020 PCP: [place]      Brief Narrative:    Medical records reviewed and are as summarized below:  Jessica Willis is a 55 y.o. 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  She was eventually discharged home on home oxygen for chronic hypoxic respiratory failure.  She presented to the hospital again on 10/25/2020 because of poor oral intake, generalized weakness and frequent falls at home since discharge from the hospital.  She also complained of swelling and tingling in the right lower extremity.  She was found to have hypernatremia, hypokalemia and right lower extremity DVT.  She was treated with IV fluids hypokalemia was repleted.  She was started on Eliquis for treatment of DVT.      Assessment/Plan:   Principal Problem:   Failure to thrive in adult Active Problems:   Breast mass, right   Metastatic breast cancer (HCC)   Anemia, chronic disease   Iron deficiency anemia   Bone metastasis (HCC)   Port-A-Cath in place   Chemotherapy-induced nausea   Poor appetite   Weakness   Dehydration   COVID-19 virus infection   Protein-calorie malnutrition, severe   Hypernatremia   Acute deep vein thrombosis (DVT) of right femoral vein (HCC)   Pressure injury of skin   Nutrition Problem: Severe Malnutrition Etiology: acute illness (HCAP, COVID 19)  Signs/Symptoms: moderate fat depletion,moderate muscle depletion,severe muscle depletion,percent weight loss Percent weight loss: 12  %   Body mass index is 21.94 kg/m.    Acute right femoral DVT: Continue Eliquis  Metastatic triple negative right breast cancer with high tumor burden: Chemotherapy was initiated on 10/14/2020 but progressed after first-line palliative chemotherapy with Taxol. Outpatient follow-up with ophthalmologist for evaluation of possible metastatic disease to extraocular muscles.  Anemia of chronic disease: Hemoglobin improved s/p transfusion with 1 unit of PRBCs on 10/29/2020.  Monitor H&H.  Asymptomatic COVID-19 infection: Completed IV remdesivir on 10/29/2020.  Chronic hypoxic respiratory failure: Continue 3 L/min oxygen via nasal cannula  Severe protein calorie malnutrition, failure to thrive: Encourage use of dietary supplements and adequate oral intake.  Follow-up with dietitian.  Generalized weakness: PT and OT recommended discharge to SNF  Hypokalemia, hyperkalemia, hypernatremia, dehydration: Resolved  Stage II decubitus ulcers of the right buttocks (present on admission): Continue local wound care.  Patient encouraged to turn frequently.  Recent discharge from the hospital on 10/21/2020 after hospitalization for sepsis secondary to pneumonia complicated by acute hypoxic respiratory failure.  Awaiting placement to SNF.  Difficult to place because of COVID-19 infection and the need for chemotherapy at discharge.      Diet Order            DIET SOFT Room service appropriate? Yes; Fluid consistency: Thin  Diet effective now                    Consultants:  Oncologist  Palliative care team  Procedures:  None    Medications:   . apixaban  10 mg Oral BID   Followed by  . [START ON 11/02/2020] apixaban  5 mg Oral BID  . vitamin  C  500 mg Oral Daily  . calcium carbonate  1,250 mg Oral Q breakfast  . Chlorhexidine Gluconate Cloth  6 each Topical Daily  . dronabinol  5 mg Oral BID AC  . feeding supplement  237 mL Oral QID  . insulin aspart  0-5 Units Subcutaneous QHS   . insulin aspart  0-9 Units Subcutaneous TID WC  . magic mouthwash w/lidocaine  15 mL Oral TID  . magnesium chloride  1 tablet Oral Daily  . metoprolol tartrate  50 mg Oral BID  . multivitamin with minerals  1 tablet Oral Daily  . polyethylene glycol  17 g Oral BID  . predniSONE  20 mg Oral Q breakfast  . zinc sulfate  220 mg Oral Daily   Continuous Infusions:    Anti-infectives (From admission, onward)   Start     Dose/Rate Route Frequency Ordered Stop   10/26/20 1000  remdesivir 100 mg in sodium chloride 0.9 % 100 mL IVPB       "Followed by" Linked Group Details   100 mg 200 mL/hr over 30 Minutes Intravenous Daily 10/25/20 1648 10/29/20 1859   10/25/20 1800  remdesivir 200 mg in sodium chloride 0.9% 250 mL IVPB       "Followed by" Linked Group Details   200 mg 580 mL/hr over 30 Minutes Intravenous Once 10/25/20 1648 10/25/20 2130             Family Communication/Anticipated D/C date and plan/Code Status   DVT prophylaxis: Place TED hose Start: 10/25/20 1530 apixaban (ELIQUIS) tablet 10 mg  apixaban (ELIQUIS) tablet 5 mg     Code Status: Full Code  Family Communication: None Disposition Plan:    Status is: Inpatient  Remains inpatient appropriate because:IV treatments appropriate due to intensity of illness or inability to take PO and Inpatient level of care appropriate due to severity of illness   Dispo: The patient is from: Home              Anticipated d/c is to: Home              Patient currently is medically stable to d/c.   Difficult to place patient Yes           Subjective:   Interval events noted.  No new complaints.  Right leg is still swollen.  Objective:    Vitals:   10/31/20 2300 11/01/20 0100 11/01/20 0524 11/01/20 0818  BP: 105/61 116/65 113/66 116/60  Pulse: 96 (!) 108 (!) 112 (!) 112  Resp: 18 18 20 18   Temp: 98.5 F (36.9 C) 98.4 F (36.9 C) 99.1 F (37.3 C) 98.9 F (37.2 C)  TempSrc: Oral Oral Oral Oral  SpO2:  100% 100% 99% 100%  Weight:      Height:       No data found.   Intake/Output Summary (Last 24 hours) at 11/01/2020 1115 Last data filed at 11/01/2020 0500 Gross per 24 hour  Intake --  Output 200 ml  Net -200 ml   Filed Weights   10/26/20 0400 10/27/20 0400 10/29/20 0606  Weight: 68.6 kg 69.9 kg 69.4 kg    Exam:  GEN: NAD SKIN: Stage II decubitus ulcers on the left and right buttocks EYES: EOMI ENT: MMM CV: RRR PULM: CTA B ABD: soft, ND, NT, +BS CNS: Drowsy but arousable, non focal EXT: Right lower extremity edema (2+).  Trace left leg edema no tenderness or erythema    Pressure Injury 10/25/20 Buttocks Left;Right Stage  2 -  Partial thickness loss of dermis presenting as a shallow open injury with a red, pink wound bed without slough. (Active)  10/25/20 2100  Location: Buttocks  Location Orientation: Left;Right  Staging: Stage 2 -  Partial thickness loss of dermis presenting as a shallow open injury with a red, pink wound bed without slough.  Wound Description (Comments):   Present on Admission: Yes     Data Reviewed:   I have personally reviewed following labs and imaging studies:  Labs: Labs show the following:   Basic Metabolic Panel: Recent Labs  Lab 10/25/20 1130 10/26/20 0504 10/27/20 0611 10/28/20 0601 10/29/20 0349 10/30/20 0421  NA 151* 152* 144 145 145 144  K 3.2* 4.0 5.6* 4.3 3.9 4.2  CL 105 108 102 103 104 105  CO2 33* 33* 31 33* 34* 32  GLUCOSE 193* 131* 94 95 90 88  BUN 44* 40* 33* 27* 23* 24*  CREATININE 0.62 0.47 0.41* 0.41* 0.38* 0.40*  CALCIUM 8.7* 9.0 9.3 8.0* 7.7* 8.1*  MG 2.9*  --  2.1  --   --   --   PHOS 2.3*  --  3.4  --   --   --    GFR Estimated Creatinine Clearance: 84.9 mL/min (A) (by C-G formula based on SCr of 0.4 mg/dL (L)). Liver Function Tests: Recent Labs  Lab 10/26/20 0504 10/27/20 0611 10/28/20 0601 10/29/20 0349 10/30/20 0421  AST 63* 64* 74* 69* 66*  ALT 17 18 17 17 17   ALKPHOS 272* 288* 296* 285*  288*  BILITOT 0.7 0.7 0.7 0.5 0.7  PROT 5.7* 5.8* 5.3* 5.0* 5.1*  ALBUMIN 2.5* 2.4* 2.2* 2.0* 2.0*   No results for input(s): LIPASE, AMYLASE in the last 168 hours. No results for input(s): AMMONIA in the last 168 hours. Coagulation profile Recent Labs  Lab 10/25/20 1130  INR 1.8*    CBC: Recent Labs  Lab 10/26/20 0504 10/27/20 0611 10/28/20 0601 10/29/20 0349 10/30/20 0421  WBC 4.7 2.1* 4.5 4.7 7.5  NEUTROABS 3.4 1.3* 2.4 2.9 4.6  HGB 7.9* 8.7* 8.1* 7.2* 8.4*  HCT 28.6* 30.7* 27.2* 24.9* 28.6*  MCV 89.1 89.0 85.8 86.5 87.2  PLT 165 196 242 243 316   Cardiac Enzymes: Recent Labs  Lab 10/25/20 1915  CKTOTAL 44   BNP (last 3 results) No results for input(s): PROBNP in the last 8760 hours. CBG: Recent Labs  Lab 10/31/20 0812 10/31/20 1158 10/31/20 1635 10/31/20 2055 11/01/20 0818  GLUCAP 80 102* 101* 107* 73   D-Dimer: Recent Labs    10/30/20 0421  DDIMER 3.50*   Hgb A1c: No results for input(s): HGBA1C in the last 72 hours. Lipid Profile: No results for input(s): CHOL, HDL, LDLCALC, TRIG, CHOLHDL, LDLDIRECT in the last 72 hours. Thyroid function studies: No results for input(s): TSH, T4TOTAL, T3FREE, THYROIDAB in the last 72 hours.  Invalid input(s): FREET3 Anemia work up: No results for input(s): VITAMINB12, FOLATE, FERRITIN, TIBC, IRON, RETICCTPCT in the last 72 hours. Sepsis Labs: Recent Labs  Lab 10/25/20 1130 10/25/20 1450 10/25/20 1915 10/26/20 0504 10/27/20 0611 10/28/20 0601 10/29/20 0349 10/30/20 0421  PROCALCITON  --   --  73.80  --   --   --   --   --   WBC 5.0  --   --    < > 2.1* 4.5 4.7 7.5  LATICACIDVEN 4.9* 4.2*  --   --   --   --   --   --    < > =  values in this interval not displayed.    Microbiology Recent Results (from the past 240 hour(s))  Culture, blood (Routine x 2)     Status: None   Collection Time: 10/25/20  2:49 PM   Specimen: BLOOD  Result Value Ref Range Status   Specimen Description BLOOD BLOOD LEFT  HAND  Final   Special Requests   Final    BOTTLES DRAWN AEROBIC AND ANAEROBIC Blood Culture results may not be optimal due to an inadequate volume of blood received in culture bottles   Culture   Final    NO GROWTH 5 DAYS Performed at [facility], [address] [place], [place] [address]    Report Status 10/30/2020 FINAL  Final  Culture, blood (Routine x 2)     Status: None   Collection Time: 10/25/20  3:29 PM   Specimen: BLOOD  Result Value Ref Range Status   Specimen Description BLOOD BLOOD LEFT HAND  Final   Special Requests   Final    BOTTLES DRAWN AEROBIC AND ANAEROBIC Blood Culture adequate volume   Culture   Final    NO GROWTH 5 DAYS Performed at [facility], [address] [place], [place] [address]    Report Status 10/30/2020 FINAL  Final  Resp Panel by RT-PCR (Flu A&B, Covid) Nasopharyngeal Swab     Status: Abnormal   Collection Time: 10/25/20  3:30 PM   Specimen: Nasopharyngeal Swab; Nasopharyngeal(NP) swabs in vial transport medium  Result Value Ref Range Status   SARS Coronavirus 2 by RT PCR POSITIVE (A) NEGATIVE Final    Comment: RESULT CALLED TO, READ BACK BY AND VERIFIED WITH: laura hernandez 10/25/20 at 1641 by acr (NOTE) SARS-CoV-2 target nucleic acids are DETECTED.  The SARS-CoV-2 RNA is generally detectable in upper respiratory specimens during the acute phase of infection. Positive results are indicative of the presence of the identified virus, but do not rule out bacterial infection or co-infection with other pathogens not detected by the test. Clinical correlation with patient history and other diagnostic information is necessary to determine patient infection status. The expected result is Negative.  Fact Sheet for Patients: EntrepreneurPulse.com.au  Fact Sheet for Healthcare Providers: IncredibleEmployment.be  This test is not yet approved or cleared by the [place] FDA and  has been  authorized for detection and/or diagnosis of SARS-CoV-2 by FDA under an Emergency Use Authorization (EUA).  This EUA will remain in effect (meaning this test c an be used) for the duration of  the COVID-19 declaration under Section 564(b)(1) of the Act, 21 U.S.C. section 360bbb-3(b)(1), unless the authorization is terminated or revoked sooner.     Influenza A by PCR NEGATIVE NEGATIVE Final   Influenza B by PCR NEGATIVE NEGATIVE Final    Comment: (NOTE) The Xpert Xpress SARS-CoV-2/FLU/RSV plus assay is intended as an aid in the diagnosis of influenza from Nasopharyngeal swab specimens and should not be used as a sole basis for treatment. Nasal washings and aspirates are unacceptable for Xpert Xpress SARS-CoV-2/FLU/RSV testing.  Fact Sheet for Patients: EntrepreneurPulse.com.au  Fact Sheet for Healthcare Providers: IncredibleEmployment.be  This test is not yet approved or cleared by the [place] FDA and has been authorized for detection and/or diagnosis of SARS-CoV-2 by FDA under an Emergency Use Authorization (EUA). This EUA will remain in effect (meaning this test can be used) for the duration of the COVID-19 declaration under Section 564(b)(1) of the Act, 21 U.S.C. section 360bbb-3(b)(1), unless the authorization is terminated or revoked.  Performed at [facility], [address] [place], [place] [address]     Procedures and diagnostic studies:  No results found.             LOS: 6 days   Lysha Schrade  [facility]   Pager on [email or web address]. If 7PM-7AM, please contact night-coverage at [email or web address]     11/01/2020, 11:15 AM

## 2020-11-01 NOTE — NC FL2 (Signed)
[place] LEVEL OF CARE SCREENING TOOL     IDENTIFICATION  Patient Name: Jessica Willis Birthdate: [birth year] Sex: female Admission Date (Current Location): 10/25/2020  [place] and [place] Number:  Engineering geologist and Address:  [facility], [address] [place], [place] [address]      Provider Number: [id number]  Attending Physician Name and Address:  Jennye Boroughs, MD  Relative Name and Phone Number:  Merlene Laughter (daughter) [phone]    Current Level of Care: Hospital Recommended Level of Care: [place] Prior Approval Number:    Date Approved/Denied:   PASRR Number: [id number]  Discharge Plan: SNF    Current Diagnoses: Patient Active Problem List   Diagnosis Date Noted  . Pressure injury of skin 10/27/2020  . Protein-calorie malnutrition, severe 10/26/2020  . Hypernatremia 10/26/2020  . Acute deep vein thrombosis (DVT) of right femoral vein ([place]) 10/26/2020  . Weakness 10/25/2020  . Failure to thrive in adult 10/25/2020  . Dehydration 10/25/2020  . COVID-19 virus infection 10/25/2020  . Chemotherapy-induced nausea   . Poor appetite   . Pneumonia due to infectious organism 10/07/2020  . QT prolongation 09/22/2020  . Pneumonia due to Streptococcus pyogenes ([place]) 08/22/2020  . Genetic testing 07/27/2020  . Pancreatic lesion 07/22/2020  . Swelling of lower leg 07/13/2020  . Encounter for antineoplastic chemotherapy 07/04/2020  . Complication of chemotherapy 07/04/2020  . Family history of breast cancer   . Port-A-Cath in place   . Bone metastasis ([place])   . Hypocalcemia   . Folate deficiency   . Gastroesophageal reflux disease without esophagitis   . Weakness of right arm   . Palliative care encounter   . Iron deficiency anemia   . Anemia, chronic disease   . Tachycardia   . Metastatic breast cancer ([place])   . Malignant pleural effusion   . Chest tube in place   . Goals of care,  counseling/discussion   . Hemothorax on right 06/16/2020  . Acute respiratory failure with hypoxia ([place])   . Breast mass, right 06/15/2020  . Mastitis 06/15/2020  . Pleural effusion 06/15/2020  . Intestinal obstruction ([place])   . Leukocytosis   . Hypokalemia 06/24/2015  . Sepsis ([place]) 06/23/2015  . CAP (community acquired pneumonia) 06/23/2015  . Partial small bowel obstruction ([place]) 06/23/2015    Orientation RESPIRATION BLADDER Height & Weight     Self,Time,Situation,Place  O2 (3L [place]) Continent Weight: 69.4 kg Height:  5\' 10"  (177.8 cm)  BEHAVIORAL SYMPTOMS/MOOD NEUROLOGICAL BOWEL NUTRITION STATUS      Continent Diet (Regular Soft Diet)  AMBULATORY STATUS COMMUNICATION OF NEEDS Skin   Extensive Assist Verbally Other (Comment),PU Stage and Appropriate Care (Fungating Right breast cancer- see orders for dressings.)   PU Stage 2 Dressing: Daily (right and left buttock- foam dressing)                   Personal Care Assistance Level of Assistance  Bathing,Feeding,Dressing Bathing Assistance: Limited assistance Feeding assistance: Limited assistance Dressing Assistance: Limited assistance     Functional Limitations Info             SPECIAL CARE FACTORS FREQUENCY  PT (By licensed PT),OT (By licensed OT)     PT Frequency: 5 times per week OT Frequency: 5 times per week            Contractures Contractures Info: Not present    Additional Factors Info  Code Status,Allergies Code Status Info: Full Allergies Info: NKA  Current Medications (11/01/2020):  This is the current hospital active medication list Current Facility-Administered Medications  Medication Dose Route Frequency Provider Last Rate Last Admin  . albuterol (PROVENTIL) (2.5 MG/3ML) 0.083% nebulizer solution 2.5 mg  2.5 mg Nebulization Q6H PRN Cox, Amy N, DO      . albuterol (VENTOLIN HFA) 108 (90 Base) MCG/ACT inhaler 2 puff  2 puff Inhalation Q4H PRN Cox, Amy N, DO      . apixaban  (ELIQUIS) tablet 10 mg  10 mg Oral BID Jennye Boroughs, MD   10 mg at 11/01/20 1029   Followed by  . [START ON 11/02/2020] apixaban (ELIQUIS) tablet 5 mg  5 mg Oral BID Jennye Boroughs, MD      . ascorbic acid (VITAMIN C) tablet 500 mg  500 mg Oral Daily Cox, Amy N, DO   500 mg at 11/01/20 1029  . calcium carbonate (OS-CAL - dosed in mg of elemental calcium) tablet 1,250 mg  1,250 mg Oral Q breakfast Cox, Amy N, DO   1,250 mg at 11/01/20 1350  . Chlorhexidine Gluconate Cloth 2 % PADS 6 each  6 each Topical Daily Jennye Boroughs, MD   6 each at 10/31/20 [id number]  . chlorpheniramine-HYDROcodone (TUSSIONEX) 10-8 MG/5ML suspension 5 mL  5 mL Oral Q12H PRN Cox, Amy N, DO      . dronabinol (MARINOL) capsule 5 mg  5 mg Oral BID AC Cox, Amy N, DO   5 mg at 11/01/20 1350  . feeding supplement (ENSURE ENLIVE / ENSURE PLUS) liquid 237 mL  237 mL Oral QID Jennye Boroughs, MD   237 mL at 11/01/20 1028  . guaiFENesin-dextromethorphan (ROBITUSSIN DM) 100-10 MG/5ML syrup 10 mL  10 mL Oral Q4H PRN Cox, Amy N, DO      . insulin aspart (novoLOG) injection 0-5 Units  0-5 Units Subcutaneous QHS Cox, Amy N, DO      . insulin aspart (novoLOG) injection 0-9 Units  0-9 Units Subcutaneous TID WC Cox, Amy N, DO   1 Units at 10/30/20 1732  . magic mouthwash w/lidocaine  15 mL Oral TID Cox, Amy N, DO   15 mL at 10/31/20 2024  . magnesium chloride (SLOW-MAG) 64 MG SR tablet 64 mg  1 tablet Oral Daily Cox, Amy N, DO   64 mg at 10/31/20 0853  . metoprolol tartrate (LOPRESSOR) tablet 50 mg  50 mg Oral BID Cox, Amy N, DO   50 mg at 11/01/20 1029  . morphine 4 MG/ML injection 4 mg  4 mg Intravenous Q4H PRN Cox, Amy N, DO   4 mg at 10/28/20 1853  . multivitamin with minerals tablet 1 tablet  1 tablet Oral Daily Cox, Amy N, DO   1 tablet at 11/01/20 1029  . ondansetron (ZOFRAN) injection 4 mg  4 mg Intravenous Q8H PRN Cox, Amy N, DO      . oxyCODONE-acetaminophen (PERCOCET) 7.5-325 MG per tablet 1 tablet  1 tablet Oral Q8H PRN Cox, Amy N, DO    1 tablet at 10/25/20 1900  . polyethylene glycol (MIRALAX / GLYCOLAX) packet 17 g  17 g Oral BID Cox, Amy N, DO   17 g at 10/28/20 2157  . predniSONE (DELTASONE) tablet 20 mg  20 mg Oral Q breakfast Earlie Server, MD   20 mg at 11/01/20 1029  . sodium chloride flush (NS) 0.9 % injection 10 mL  10 mL Intravenous PRN Jennye Boroughs, MD   10 mL at 10/30/20 1346  . zinc sulfate  capsule 220 mg  220 mg Oral Daily Cox, Amy N, DO   220 mg at 11/01/20 1029     Discharge Medications: Please see discharge summary for a list of discharge medications.  Relevant Imaging Results:  Relevant Lab Results:   Additional Information SS# [id number]  Shelbie Hutching, RN

## 2020-11-01 NOTE — Progress Notes (Signed)
Physical Therapy Treatment Patient Details Name: Jessica Willis MRN: [id number] DOB: March 26, 1966 Today's Date: 11/01/2020    History of Present Illness Pt is a 55 y.o. female presenting to hospital 3/15 with R leg paresthesias and discomfort (pt fell on R side after coming home from hospital and developed symptoms after fall).  Imaging (+) extensive acute DVT R LE extending from proximal R femoral vein through veins of the calf.  Pt admitted with FTT.  (+) COVID-19.  PMH includes anemia, invasive R breast CA, on chronic O2, chemotherapy, PNA, bone metastasis, R arm weakness, tachycardia, metastatic breast CA, VATS.    PT Comments    Pt seen for PT/OT co-treatment.  Pt resting in bed upon therapy arrival and large bowel movement noted covering entire purewick; pt agreeable to transferring to [facility] for toileting.  Pt able to perform semi-supine to sitting edge of bed with SBA (increased effort/time for pt to perform on own); max assist x1 plus min assist of 2nd for stand pivot transfer bed to [facility]; pt unable to stand from [facility] with 1 assist and max cueing (for clean-up) so pt transferred [facility] to bed with max assist x2.  Peri-hygiene performed at bed level (min assist for logrolling in bed).  Nurse notified of pt's sacral wound appearing worse than previous session 3/18 (nurse came to room to assess wounds).  Will continue to focus on strengthening and progressive functional mobility per pt tolerance.   Follow Up Recommendations  SNF     Equipment Recommendations  Rolling walker with 5" wheels;3in1 (PT);Wheelchair (measurements PT);Wheelchair cushion (measurements PT)    Recommendations for Other Services       Precautions / Restrictions Precautions Precautions: Fall Precaution Comments: Metastatic breast CA with bone metastasis; L chest port Restrictions Weight Bearing Restrictions: No    Mobility  Bed Mobility Overal bed mobility: Needs Assistance Bed Mobility: Rolling Rolling: Min  assist (to logroll in bed for clean up)   Supine to sit: Supervision;HOB elevated Sit to supine: Mod assist;HOB elevated   General bed mobility comments: increased time for pt to perform semi-supine to sitting edge of bed on own; mod assist for LE's sit to semi-supine in bed    Transfers Overall transfer level: Needs assistance Equipment used: None Transfers: Stand Pivot Transfers Sit to Stand: +2 physical assistance         General transfer comment: max assist x1 plus min assist of 2nd stand pivot transfer bed to [facility]; max assist x2 stand pivot transfer BSC to bed; vc's for technique; pt unable to perform partial stand with 1 assist (use of UE's on BSC arms) for clean-up on [facility]  Ambulation/Gait             General Gait Details: unable to fully stand to attempt   Stairs             Wheelchair Mobility    Modified Rankin (Stroke Patients Only)       Balance Overall balance assessment: Needs assistance Sitting-balance support: Single extremity supported;Feet supported Sitting balance-Leahy Scale: Good Sitting balance - Comments: steady sitting with single UE support on bed                                    Cognition Arousal/Alertness: Awake/alert Behavior During Therapy: Flat affect Overall Cognitive Status: Within Functional Limits for tasks assessed  General Comments: Pt tends to avoid eye contact      Exercises Total Joint Exercises Ankle Circles/Pumps: AROM;Strengthening;Both;10 reps;Supine Heel Slides: AROM;Left;AAROM;Right;Strengthening;10 reps;Supine    General Comments General comments (skin integrity, edema, etc.): wounds noted on pt's bottom--RN came in to address wounds.  Pt agreeable to therapy session.      Pertinent Vitals/Pain Pain Assessment: Faces Faces Pain Scale:  (0/10 pain at rest; 6-8/10 perineal area pain with clean up d/t bowel movement) Pain Descriptors /  Indicators: Burning Pain Intervention(s): Limited activity within patient's tolerance;Monitored during session;Repositioned  HR in 120's bpm during session (even at rest beginning/end of session).    Home Living                      Prior Function            PT Goals (current goals can now be found in the care plan section) Acute Rehab PT Goals Patient Stated Goal: To return home PT Goal Formulation: With patient Time For Goal Achievement: 11/11/20 Potential to Achieve Goals: Fair Progress towards PT goals: Progressing toward goals    Frequency    Min 2X/week      PT Plan Current plan remains appropriate    Co-evaluation PT/OT/SLP Co-Evaluation/Treatment: Yes Reason for Co-Treatment: For patient/therapist safety;To address functional/ADL transfers;Complexity of the patient's impairments (multi-system involvement) PT goals addressed during session: Mobility/safety with mobility;Balance OT goals addressed during session: ADL's and self-care      AM-PAC PT "6 Clicks" Mobility   Outcome Measure  Help needed turning from your back to your side while in a flat bed without using bedrails?: A Little Help needed moving from lying on your back to sitting on the side of a flat bed without using bedrails?: A Little Help needed moving to and from a bed to a chair (including a wheelchair)?: Total Help needed standing up from a chair using your arms (e.g., wheelchair or bedside chair)?: Total Help needed to walk in hospital room?: Total Help needed climbing 3-5 steps with a railing? : Total 6 Click Score: 10    End of Session Equipment Utilized During Treatment: Gait belt Activity Tolerance: Patient limited by pain Patient left: in bed (with OT and nurse present assisting pt with clean-up) Nurse Communication: Mobility status;Precautions;Other (comment) (pt's skin concerns) PT Visit Diagnosis: Other abnormalities of gait and mobility (R26.89);Muscle weakness  (generalized) (M62.81);History of falling (Z91.81);Difficulty in walking, not elsewhere classified (R26.2)     Time: 3338-3291 PT Time Calculation (min) (ACUTE ONLY): 43 min  Charges:  $Therapeutic Activity: 23-37 mins                    Leitha Bleak, PT 11/01/20, 5:01 PM

## 2020-11-01 NOTE — TOC Initial Note (Signed)
Transition of Care [facility]) - Initial/Assessment Note    Patient Details  Name: Jessica Willis MRN: [id number] Date of Birth: [birth year]  [facility]) CM/SW Contact:    Shelbie Hutching, RN Phone Number: 11/01/2020, 1:33 PM  Clinical Narrative:                 Patient admitted to the hospital for failure to thrive, hypernatremia, metastatic breast cancer.  RNCM met with patient at the bedside this morning.  Patient reports that she is from home where she lives with a friend who provides her transportation or her children can also drive.  Patient was discharged on oxygen this past admission- discharged on 3/11- oxygen from [facility].  Patient was also set up with home health services through [facility]  [facility] has notified this RNCM that they will not be able to see patient again if patient discharges home.  [facility] reports that she is not appropriate for home health services and believes that she needs hospice care.   Patient has met with palliative this admission and still wants to continue with treatment. PT is recommending SNF and patient agrees to go to rehab.  She has no preference in facility.   TOC starting bed search and SNF workup.   Expected Discharge Plan: Skilled Nursing Facility Barriers to Discharge: Continued Medical Work up   Patient Goals and CMS Choice Patient states their goals for this hospitalization and ongoing recovery are:: Agrees to SNF and wants to continue with cancer treatment CMS Medicare.gov Compare Post Acute Care list provided to:: Patient Choice offered to / list presented to : Patient  Expected Discharge Plan and Services Expected Discharge Plan: [place] In-house Referral: Hospice / Palliative Care Discharge Planning Services: CM Consult Post Acute Care Choice: [place] Living arrangements for the past 2 months: Single Family Home                 DME Arranged: N/A DME Agency: NA       HH Arranged: NA           Prior Living Arrangements/Services Living arrangements for the past 2 months: Single Family Home Lives with:: [place] Patient language and need for interpreter reviewed:: Yes Do you feel safe going back to the place where you live?: Yes      Need for Family Participation in Patient Care: Yes (Comment) (cancer - metastitic) Care giver support system in place?: Yes (comment) (daughter, son, and friend that lives in the home) Current home services: DME (oxygen) Criminal Activity/Legal Involvement Pertinent to Current Situation/Hospitalization: No - Comment as needed  Activities of Daily Living Home Assistive Devices/Equipment: CBG Meter ADL Screening (condition at time of admission) Patient's cognitive ability adequate to safely complete daily activities?: Yes Is the patient deaf or have difficulty hearing?: No Does the patient have difficulty seeing, even when wearing glasses/contacts?: No Does the patient have difficulty concentrating, remembering, or making decisions?: No Patient able to express need for assistance with ADLs?: Yes Does the patient have difficulty dressing or bathing?: Yes Independently performs ADLs?: No Communication: Independent Dressing (OT): Needs assistance Feeding: Independent Bathing: Needs assistance Toileting: Needs assistance In/Out Bed: Dependent Does the patient have difficulty walking or climbing stairs?: Yes Weakness of Legs: Both Weakness of Arms/Hands: Both  Permission Sought/Granted Permission sought to share information with : Case Manager,Family Chief Financial Officer Permission granted to share information with : Yes, Verbal Permission Granted  Share Information with NAME: Delana Meyer  Permission granted to  share info w AGENCY: SNF's in area  Permission granted to share info w Relationship: daughter     Emotional Assessment Appearance:: Appears stated age Attitude/Demeanor/Rapport: Engaged Affect  (typically observed): Accepting Orientation: : Oriented to Self,Oriented to Place,Oriented to  Time,Oriented to Situation Alcohol / Substance Use: Not Applicable Psych Involvement: No (comment)  Admission diagnosis:  Dehydration [E86.0] Hypokalemia [E87.6] Paresthesia [R20.2] Hypernatremia [E87.0] Weakness [R53.1] Pain [R52] Fall [W19.XXXA] Right leg pain [M79.604] Patient Active Problem List   Diagnosis Date Noted  . Pressure injury of skin 10/27/2020  . Protein-calorie malnutrition, severe 10/26/2020  . Hypernatremia 10/26/2020  . Acute deep vein thrombosis (DVT) of right femoral vein ([place]) 10/26/2020  . Weakness 10/25/2020  . Failure to thrive in adult 10/25/2020  . Dehydration 10/25/2020  . COVID-19 virus infection 10/25/2020  . Chemotherapy-induced nausea   . Poor appetite   . Pneumonia due to infectious organism 10/07/2020  . QT prolongation 09/22/2020  . Pneumonia due to Streptococcus pyogenes ([place]) 08/22/2020  . Genetic testing 07/27/2020  . Pancreatic lesion 07/22/2020  . Swelling of lower leg 07/13/2020  . Encounter for antineoplastic chemotherapy 07/04/2020  . Complication of chemotherapy 07/04/2020  . Family history of breast cancer   . Port-A-Cath in place   . Bone metastasis ([place])   . Hypocalcemia   . Folate deficiency   . Gastroesophageal reflux disease without esophagitis   . Weakness of right arm   . Palliative care encounter   . Iron deficiency anemia   . Anemia, chronic disease   . Tachycardia   . Metastatic breast cancer ([place])   . Malignant pleural effusion   . Chest tube in place   . Goals of care, counseling/discussion   . Hemothorax on right 06/16/2020  . Acute respiratory failure with hypoxia ([place])   . Breast mass, right 06/15/2020  . Mastitis 06/15/2020  . Pleural effusion 06/15/2020  . Intestinal obstruction ([place])   . Leukocytosis   . Hypokalemia 06/24/2015  . Sepsis ([place]) 06/23/2015  . CAP (community acquired pneumonia) 06/23/2015   . Partial small bowel obstruction ([place]) 06/23/2015   PCP:  [facility]:   [place], [place] - [address] [place] [address] Phone: [phone] Fax: [phone]     Social Determinants of Health (SDOH) Interventions    Readmission Risk Interventions Readmission Risk Prevention Plan 11/01/2020 10/19/2020  Transportation Screening Complete Complete  Medication Review Press photographer) Complete Complete  PCP or Specialist appointment within 3-5 days of discharge Complete Complete  HRI or Home Care Consult Complete Complete  SW Recovery Care/Counseling Consult Complete Complete  Palliative Care Screening Complete -  [place] Complete Not Applicable  Some recent data might be hidden

## 2020-11-02 DIAGNOSIS — I82411 Acute embolism and thrombosis of right femoral vein: Secondary | ICD-10-CM | POA: Diagnosis not present

## 2020-11-02 DIAGNOSIS — R627 Adult failure to thrive: Secondary | ICD-10-CM | POA: Diagnosis not present

## 2020-11-02 DIAGNOSIS — C50919 Malignant neoplasm of unspecified site of unspecified female breast: Secondary | ICD-10-CM | POA: Diagnosis not present

## 2020-11-02 LAB — CBC
HCT: 33.2 % — ABNORMAL LOW (ref 36.0–46.0)
Hemoglobin: 9.9 g/dL — ABNORMAL LOW (ref 12.0–15.0)
MCH: 26.1 pg (ref 26.0–34.0)
MCHC: 29.8 g/dL — ABNORMAL LOW (ref 30.0–36.0)
MCV: 87.4 fL (ref 80.0–100.0)
Platelets: 386 10*3/uL (ref 150–400)
RBC: 3.8 MIL/uL — ABNORMAL LOW (ref 3.87–5.11)
RDW: 17.3 % — ABNORMAL HIGH (ref 11.5–15.5)
WBC: 18.4 10*3/uL — ABNORMAL HIGH (ref 4.0–10.5)
nRBC: 0.2 % (ref 0.0–0.2)

## 2020-11-02 LAB — GLUCOSE, CAPILLARY
Glucose-Capillary: 87 mg/dL (ref 70–99)
Glucose-Capillary: 90 mg/dL (ref 70–99)
Glucose-Capillary: 85 mg/dL (ref 70–99)

## 2020-11-02 NOTE — Progress Notes (Signed)
Patient refused her evening Magic Mouthwash and Ensure. Patient also refused to be turned and refused wound care for today.

## 2020-11-02 NOTE — Progress Notes (Signed)
Nutrition Follow-up  DOCUMENTATION CODES:   Severe malnutrition in context of acute illness/injury  INTERVENTION:   -Continue Ensure Enlive po QID, each supplement provides 350 kcal and 20 grams of protein -Continue Magic cup TID with meals, each supplement provides 290 kcal and 9 grams of protein -Continue MVI with minerals daily  NUTRITION DIAGNOSIS:   Severe Malnutrition related to acute illness (HCAP, COVID 19) as evidenced by moderate fat depletion,moderate muscle depletion,severe muscle depletion,percent weight loss.  Ongoing  GOAL:   Patient will meet greater than or equal to 90% of their needs  Progressing   MONITOR:   PO intake,Supplement acceptance,Labs,Weight trends,Skin,I & O's  REASON FOR ASSESSMENT:   Consult Assessment of nutrition requirement/status  ASSESSMENT:   55 y.o. female with medical history significant for right fungating breast cancer with metastasis (stage IV) to the lungs on IV chemotherapy, failure to thrive, malignant pleural effusion, hemothorax and debility who presents to the emergency department for chief concerns of weakness and found to have COVID 19  Reviewed I/O's: -400 ml x 24 hours and -629 ml since admission  UOP: 400 ml x 24 hours  Spoke with pt at bedside, who complains of being hot. She shares that she tries to eat what she can off trays, but often it is not much. Delivered lunch tray to pt and helped her set it up. Per pt, she is not hungry, but may eat a little later. Meal completion 5%.   Pt reports she has been trying to consume Enlive supplements. She refused this morning's dose, stating it was not cold. Discussed importance of good meal and supplement intake to promote healing.   Per TOC notes, plan for SNF placement at discharge.   Medications reviewed and include marinol, prednisone, miralax, and zinc sulfate.   Pt with poor oral intake and would benefit from nutrient dense supplement. One Ensure Enlive supplement  provides 350 kcals, 20 grams protein, and 44-45 grams of carbohydrate vs one Glucerna shake supplement, which provides 220 kcals, 10 grams of protein, and 26 grams of carbohydrate. Given pt's hx of DM, RD will reassess adequacy of PO intake, CBGS, and adjust supplement regimen as appropriate at follow-up.   Labs reviewed: CBGS: 79-124.   Diet Order:   Diet Order            DIET SOFT Room service appropriate? Yes; Fluid consistency: Thin  Diet effective now                 EDUCATION NEEDS:   Education needs have been addressed  Skin:  Skin Assessment: Skin Integrity Issues: Skin Integrity Issues:: Stage II,Other (Comment) Stage II: rt and lt buttocks Other: non pressure wound to breast (necrotic tumor)  Last BM:  11/02/20  Height:   Ht Readings from Last 1 Encounters:  10/25/20 5\' 10"  (1.778 m)    Weight:   Wt Readings from Last 1 Encounters:  10/29/20 69.4 kg    Ideal Body Weight:  68 kg  BMI:  Body mass index is 21.94 kg/m.  Estimated Nutritional Needs:   Kcal:  1900-2200kcal/day  Protein:  95-110g/day  Fluid:  2.0-2.3L/day    Loistine Chance, RD, LDN, [place] Registered Dietitian II Certified Diabetes Care and Education Specialist Please refer to [facility] for RD and/or RD on-call/weekend/after hours pager

## 2020-11-02 NOTE — Progress Notes (Signed)
°   11/02/20 2221  Assess: MEWS Score  Temp 97.8 F (36.6 C)  BP 121/63  Pulse Rate (!) 120  Resp 16  SpO2 100 %  O2 Device Nasal Cannula  O2 Flow Rate (L/min) 3 L/min  Assess: MEWS Score  MEWS Temp 0  MEWS Systolic 0  MEWS Pulse 2  MEWS RR 0  MEWS LOC 0  MEWS Score 2  MEWS Score Color Yellow  Assess: if the MEWS score is Yellow or Red  Were vital signs taken at a resting state? Yes  Focused Assessment No change from prior assessment  Early Detection of Sepsis Score *See Row Information* Low  MEWS guidelines implemented *See Row Information* Yes  Treat  MEWS Interventions Escalated (See documentation below)  Pain Scale 0-10  Pain Score 0  Take Vital Signs  Increase Vital Sign Frequency  Yellow: Q 2hr X 2 then Q 4hr X 2, if remains yellow, continue Q 4hrs  Escalate  MEWS: Escalate Yellow: discuss with charge nurse/RN and consider discussing with provider and RRT  Notify: Charge Nurse/RN  Name of Charge Nurse/RN Notified Orvil Feil, RN  Date Charge Nurse/RN Notified 11/02/20  Time Charge Nurse/RN Notified 2246  Document  Progress note created (see row info) Yes  Pt has been in/out of Yellow MEWS b/c of HR.  Just gave 50 mg metoprolol.  Will continue to evaluate.

## 2020-11-02 NOTE — Plan of Care (Signed)
Pt expressed frustration abt being awakened.  Conveyed it was very important to take the medication and to drink the Ensure.  Pointed out that if she wants to fight, it is critical she give her body fuel.  She continues to be withdrawn. No c/o pain.

## 2020-11-02 NOTE — Progress Notes (Signed)
Patient refused her morning Magic Mouthwash, she states that she takes that at night only. Patient also refused her Miralax. MD aware.

## 2020-11-02 NOTE — Progress Notes (Signed)
PROGRESS NOTE    Jessica Willis  DQQ:[id number] DOB: January 27, 1966 DOA: 10/25/2020 PCP: [facility]    Assessment & Plan:   Principal Problem:   Failure to thrive in adult Active Problems:   Breast mass, right   Metastatic breast cancer ([place])   Anemia, chronic disease   Iron deficiency anemia   Bone metastasis ([facility])   Port-A-Cath in place   Chemotherapy-induced nausea   Poor appetite   Weakness   Dehydration   COVID-19 virus infection   Protein-calorie malnutrition, severe   Hypernatremia   Acute deep vein thrombosis (DVT) of right femoral vein ([facility])   Pressure injury of skin   Acute right femoral DVT: continue on eliquis   Metastatic triple negative right breast cancer: w/ high tumor burden. Chemotherapy was initiated on 10/14/2020 but progressed after first-line palliative chemotherapy with Taxol. Outpatient follow-up with ophthalmologist for evaluation of possible metastatic disease to extraocular muscles.  Anemia of chronic disease: s/p 1 unit of pRBCs transfused. H&H are trending up today   Leukocytosis: likely reactive. Will continue to monitor   Asymptomatic COVID-19 infection: completed IV remdesivir on 10/29/2020. Continue w/ supportive care  Chronic hypoxic respiratory failure: continue on supplemental oxygen, 3L [place]   Severe protein calorie malnutrition, failure to thrive: continue w/ nutritional supplements  Generalized weakness: PT/OT recs SNF   Stage II decubitus ulcers of the right buttocks: present on admission. Continue w/ wound care   DVT prophylaxis: eliquis  Code Status: full  Family Communication: pt does not want me to call any family to give them updates  Disposition Plan: d/c to SNF   Level of care: Med-Surg   Status is: Inpatient  Remains inpatient appropriate because:Unsafe d/c plan   Dispo: The patient is from: Home              Anticipated d/c is to: SNF              Patient currently is medically stable  to d/c.   Difficult to place patient Yes        Consultants:   Oncology   Procedures:    Antimicrobials:    Subjective: Pt c/o fatigue   Objective: Vitals:   11/01/20 1935 11/01/20 2029 11/02/20 0032 11/02/20 0604  BP: 108/67  112/63 (!) 110/59  Pulse: (!) 117 (!) 120 97 100  Resp: 20  19   Temp: 98.5 F (36.9 C)  98.1 F (36.7 C) 98.2 F (36.8 C)  TempSrc: Oral  Oral Oral  SpO2: 99%  100% 100%  Weight:      Height:        Intake/Output Summary (Last 24 hours) at 11/02/2020 0727 Last data filed at 11/02/2020 0500 Gross per 24 hour  Intake -  Output 400 ml  Net -400 ml   Filed Weights   10/26/20 0400 10/27/20 0400 10/29/20 0606  Weight: 68.6 kg 69.9 kg 69.4 kg    Examination:  General exam: Appears calm and comfortable  Respiratory system: diminished breath sounds b/l  Cardiovascular system: S1 & S2 +. No rubs, gallops or clicks. B/l LE edema  Gastrointestinal system: Abdomen is nondistended, soft and nontender. Normal bowel sounds heard. Central nervous system: Alert and oriented. Moves all extremities  Psychiatry: Judgement and insight appear normal. Flat mood and affect    Data Reviewed: I have personally reviewed following labs and imaging studies  CBC: Recent Labs  Lab 10/27/20 0611 10/28/20 0601 10/29/20 0349 10/30/20 0421 11/02/20 0646  WBC 2.1* 4.5  4.7 7.5 18.4*  NEUTROABS 1.3* 2.4 2.9 4.6  --   HGB 8.7* 8.1* 7.2* 8.4* 9.9*  HCT 30.7* 27.2* 24.9* 28.6* 33.2*  MCV 89.0 85.8 86.5 87.2 87.4  PLT 196 242 243 316 416   Basic Metabolic Panel: Recent Labs  Lab 10/27/20 0611 10/28/20 0601 10/29/20 0349 10/30/20 0421  NA 144 145 145 144  K 5.6* 4.3 3.9 4.2  CL 102 103 104 105  CO2 31 33* 34* 32  GLUCOSE 94 95 90 88  BUN 33* 27* 23* 24*  CREATININE 0.41* 0.41* 0.38* 0.40*  CALCIUM 9.3 8.0* 7.7* 8.1*  MG 2.1  --   --   --   PHOS 3.4  --   --   --    GFR: Estimated Creatinine Clearance: 84.9 mL/min (A) (by C-G formula based on  SCr of 0.4 mg/dL (L)). Liver Function Tests: Recent Labs  Lab 10/27/20 0611 10/28/20 0601 10/29/20 0349 10/30/20 0421  AST 64* 74* 69* 66*  ALT 18 17 17 17   ALKPHOS 288* 296* 285* 288*  BILITOT 0.7 0.7 0.5 0.7  PROT 5.8* 5.3* 5.0* 5.1*  ALBUMIN 2.4* 2.2* 2.0* 2.0*   No results for input(s): LIPASE, AMYLASE in the last 168 hours. No results for input(s): AMMONIA in the last 168 hours. Coagulation Profile: No results for input(s): INR, PROTIME in the last 168 hours. Cardiac Enzymes: No results for input(s): CKTOTAL, CKMB, CKMBINDEX, TROPONINI in the last 168 hours. BNP (last 3 results) No results for input(s): PROBNP in the last 8760 hours. HbA1C: No results for input(s): HGBA1C in the last 72 hours. CBG: Recent Labs  Lab 10/31/20 2055 11/01/20 0818 11/01/20 1136 11/01/20 1627 11/01/20 2118  GLUCAP 107* 73 79 124* 101*   Lipid Profile: No results for input(s): CHOL, HDL, LDLCALC, TRIG, CHOLHDL, LDLDIRECT in the last 72 hours. Thyroid Function Tests: No results for input(s): TSH, T4TOTAL, FREET4, T3FREE, THYROIDAB in the last 72 hours. Anemia Panel: No results for input(s): VITAMINB12, FOLATE, FERRITIN, TIBC, IRON, RETICCTPCT in the last 72 hours. Sepsis Labs: No results for input(s): PROCALCITON, LATICACIDVEN in the last 168 hours.  Recent Results (from the past 240 hour(s))  Culture, blood (Routine x 2)     Status: None   Collection Time: 10/25/20  2:49 PM   Specimen: BLOOD  Result Value Ref Range Status   Specimen Description BLOOD BLOOD LEFT HAND  Final   Special Requests   Final    BOTTLES DRAWN AEROBIC AND ANAEROBIC Blood Culture results may not be optimal due to an inadequate volume of blood received in culture bottles   Culture   Final    NO GROWTH 5 DAYS Performed at [facility] [place], [address], [place], [place] [address]    Report Status 10/30/2020 FINAL  Final  Culture, blood (Routine x 2)     Status: None   Collection Time: 10/25/20  3:29  PM   Specimen: BLOOD  Result Value Ref Range Status   Specimen Description BLOOD BLOOD LEFT HAND  Final   Special Requests   Final    BOTTLES DRAWN AEROBIC AND ANAEROBIC Blood Culture adequate volume   Culture   Final    NO GROWTH 5 DAYS Performed at [facility], [address], [place], [place] [address]    Report Status 10/30/2020 FINAL  Final  Resp Panel by RT-PCR (Flu A&B, Covid) Nasopharyngeal Swab     Status: Abnormal   Collection Time: 10/25/20  3:30 PM   Specimen:  Nasopharyngeal Swab; Nasopharyngeal(NP) swabs in vial transport medium  Result Value Ref Range Status   SARS Coronavirus 2 by RT PCR POSITIVE (A) NEGATIVE Final    Comment: RESULT CALLED TO, READ BACK BY AND VERIFIED WITH: laura hernandez 10/25/20 at 1641 by acr (NOTE) SARS-CoV-2 target nucleic acids are DETECTED.  The SARS-CoV-2 RNA is generally detectable in upper respiratory specimens during the acute phase of infection. Positive results are indicative of the presence of the identified virus, but do not rule out bacterial infection or co-infection with other pathogens not detected by the test. Clinical correlation with patient history and other diagnostic information is necessary to determine patient infection status. The expected result is Negative.  Fact Sheet for Patients: EntrepreneurPulse.com.au  Fact Sheet for Healthcare Providers: IncredibleEmployment.be  This test is not yet approved or cleared by the [place] FDA and  has been authorized for detection and/or diagnosis of SARS-CoV-2 by FDA under an Emergency Use Authorization (EUA).  This EUA will remain in effect (meaning this test c an be used) for the duration of  the COVID-19 declaration under Section 564(b)(1) of the Act, 21 U.S.C. section 360bbb-3(b)(1), unless the authorization is terminated or revoked sooner.     Influenza A by PCR NEGATIVE NEGATIVE Final   Influenza B by PCR NEGATIVE  NEGATIVE Final    Comment: (NOTE) The Xpert Xpress SARS-CoV-2/FLU/RSV plus assay is intended as an aid in the diagnosis of influenza from Nasopharyngeal swab specimens and should not be used as a sole basis for treatment. Nasal washings and aspirates are unacceptable for Xpert Xpress SARS-CoV-2/FLU/RSV testing.  Fact Sheet for Patients: EntrepreneurPulse.com.au  Fact Sheet for Healthcare Providers: IncredibleEmployment.be  This test is not yet approved or cleared by the [place] FDA and has been authorized for detection and/or diagnosis of SARS-CoV-2 by FDA under an Emergency Use Authorization (EUA). This EUA will remain in effect (meaning this test can be used) for the duration of the COVID-19 declaration under Section 564(b)(1) of the Act, 21 U.S.C. section 360bbb-3(b)(1), unless the authorization is terminated or revoked.  Performed at [facility], [address] [place], [place] [address]          Radiology Studies: No results found.      Scheduled Meds: . apixaban  5 mg Oral BID  . vitamin C  500 mg Oral Daily  . calcium carbonate  1,250 mg Oral Q breakfast  . Chlorhexidine Gluconate Cloth  6 each Topical Daily  . dronabinol  5 mg Oral BID AC  . feeding supplement  237 mL Oral QID  . insulin aspart  0-5 Units Subcutaneous QHS  . insulin aspart  0-9 Units Subcutaneous TID WC  . magic mouthwash w/lidocaine  15 mL Oral TID  . magnesium chloride  1 tablet Oral Daily  . metoprolol tartrate  50 mg Oral BID  . multivitamin with minerals  1 tablet Oral Daily  . polyethylene glycol  17 g Oral BID  . [START ON 11/03/2020] predniSONE  10 mg Oral Q breakfast  . predniSONE  20 mg Oral Q breakfast  . zinc sulfate  220 mg Oral Daily   Continuous Infusions:   LOS: 7 days    Time spent: 31 mins    Wyvonnia Dusky, MD [facility] Pager [phone]  If 7PM-7AM, please contact  night-coverage 11/02/2020, 7:27 AM

## 2020-11-03 ENCOUNTER — Inpatient Hospital Stay: Payer: Medicaid Other

## 2020-11-03 ENCOUNTER — Inpatient Hospital Stay: Payer: Medicaid Other | Admitting: Oncology

## 2020-11-03 DIAGNOSIS — I82411 Acute embolism and thrombosis of right femoral vein: Secondary | ICD-10-CM | POA: Diagnosis not present

## 2020-11-03 DIAGNOSIS — C50919 Malignant neoplasm of unspecified site of unspecified female breast: Secondary | ICD-10-CM | POA: Diagnosis not present

## 2020-11-03 DIAGNOSIS — R627 Adult failure to thrive: Secondary | ICD-10-CM | POA: Diagnosis not present

## 2020-11-03 LAB — CBC
HCT: 27 % — ABNORMAL LOW (ref 36.0–46.0)
Hemoglobin: 8.1 g/dL — ABNORMAL LOW (ref 12.0–15.0)
MCH: 26 pg (ref 26.0–34.0)
MCHC: 30 g/dL (ref 30.0–36.0)
MCV: 86.5 fL (ref 80.0–100.0)
Platelets: 355 10*3/uL (ref 150–400)
RBC: 3.12 MIL/uL — ABNORMAL LOW (ref 3.87–5.11)
RDW: 17.4 % — ABNORMAL HIGH (ref 11.5–15.5)
WBC: 19.3 10*3/uL — ABNORMAL HIGH (ref 4.0–10.5)
nRBC: 0.1 % (ref 0.0–0.2)

## 2020-11-03 LAB — GLUCOSE, CAPILLARY
Glucose-Capillary: 75 mg/dL (ref 70–99)
Glucose-Capillary: 75 mg/dL (ref 70–99)
Glucose-Capillary: 90 mg/dL (ref 70–99)
Glucose-Capillary: 105 mg/dL — ABNORMAL HIGH (ref 70–99)
Glucose-Capillary: 132 mg/dL — ABNORMAL HIGH (ref 70–99)

## 2020-11-03 LAB — COMPREHENSIVE METABOLIC PANEL WITH GFR
ALT: 17 U/L (ref 0–44)
AST: 61 U/L — ABNORMAL HIGH (ref 15–41)
Albumin: 1.8 g/dL — ABNORMAL LOW (ref 3.5–5.0)
Alkaline Phosphatase: 306 U/L — ABNORMAL HIGH (ref 38–126)
Anion gap: 6 (ref 5–15)
BUN: 23 mg/dL — ABNORMAL HIGH (ref 6–20)
CO2: 32 mmol/L (ref 22–32)
Calcium: 8.1 mg/dL — ABNORMAL LOW (ref 8.9–10.3)
Chloride: 103 mmol/L (ref 98–111)
Creatinine, Ser: 0.36 mg/dL — ABNORMAL LOW (ref 0.44–1.00)
GFR, Estimated: >60 mL/min
Glucose, Bld: 78 mg/dL (ref 70–99)
Potassium: 4.1 mmol/L (ref 3.5–5.1)
Sodium: 141 mmol/L (ref 135–145)
Total Bilirubin: 0.6 mg/dL (ref 0.3–1.2)
Total Protein: 4.8 g/dL — ABNORMAL LOW (ref 6.5–8.1)

## 2020-11-03 NOTE — Progress Notes (Signed)
Physical Therapy Treatment Patient Details Name: Jessica Willis MRN: [id number] DOB: 1965/12/05 Today's Date: 11/03/2020    History of Present Illness Pt is a 55 y.o. female presenting to hospital 3/15 with R leg paresthesias and discomfort (pt fell on R side after coming home from hospital and developed symptoms after fall).  Imaging (+) extensive acute DVT R LE extending from proximal R femoral vein through veins of the calf.  Pt admitted with FTT.  (+) COVID-19.  PMH includes anemia, invasive R breast CA, on chronic O2, chemotherapy, PNA, bone metastasis, R arm weakness, tachycardia, metastatic breast CA, VATS.    PT Comments    Pt seen for PT/OT co-treatment.  Pt resting in bed upon therapy arrival (pt appearing to be sleeping) but woken easily with vc's.  Pt agreeable to therapy session.  Min assist with bed mobility; SBA with sitting balance; unable to stand from bed with 2 assist, RW use, and bed height elevated; and progressed to max assist lateral scooting towards L along bed.  Pt also able to tolerate LE ex's sitting edge of bed with short sitting rest breaks between ex's.  Pt noted to be incontinent of bowel requiring clean-up (pt min assist for logrolling in bed); therapists called for pt's nurse who came to address pt's sacral wounds and perform CHG bath for pt (nurse present for pt care end of session).  Will continue to focus on strengthening and progressive functional mobility per pt tolerance.   Follow Up Recommendations  SNF     Equipment Recommendations  Rolling walker with 5" wheels;3in1 (PT);Wheelchair (measurements PT);Wheelchair cushion (measurements PT);Hospital bed;Other (comment) (hoyer lift)    Recommendations for Other Services       Precautions / Restrictions Precautions Precautions: Fall Precaution Comments: Metastatic breast CA with bone metastasis; L chest port Restrictions Weight Bearing Restrictions: No    Mobility  Bed Mobility Overal bed  mobility: Needs Assistance Bed Mobility: Rolling;Supine to Sit;Sit to Supine Rolling: Min assist   Supine to sit: Min assist;HOB elevated (assist for LE's and trunk) Sit to supine: Min assist (assist for LE's)   General bed mobility comments: increased time for pt to perform as much on own as possible; vc's for technique    Transfers Overall transfer level: Needs assistance Equipment used: Rolling walker (2 wheeled);None Transfers: Sit to/from Stand;Lateral/Scoot Transfers Sit to Stand: Total assist;+2 physical assistance;From elevated surface        Lateral/Scoot Transfers: Max assist;+2 safety/equipment General transfer comment: unable to stand up to RW with 1 or 2 assist (x2 trials) and bed height elevated; 2 assist provided 1st lateral scoot to L along bed and then max assist x1 (x2 more trials) lateral scooting to L along bed  Ambulation/Gait             General Gait Details: unable to stand to attempt   Stairs             Wheelchair Mobility    Modified Rankin (Stroke Patients Only)       Balance Overall balance assessment: Needs assistance Sitting-balance support: Single extremity supported;Feet supported Sitting balance-Leahy Scale: Good Sitting balance - Comments: steady sitting reaching within BOS                                    Cognition Arousal/Alertness: Awake/alert Behavior During Therapy: Flat affect Overall Cognitive Status: Within Functional Limits for tasks assessed  General Comments: Pt tends to avoid eye contact      Exercises General Exercises - Lower Extremity Long Arc Quad: AROM;Strengthening;Both;Seated (10 reps x2 B LE's) Hip Flexion/Marching: AROM;Strengthening;Both;10 reps;Seated (decreased AROM d/t weakness)    General Comments General comments (skin integrity, edema, etc.): wounds noted on pt's bottom--RN present during session and addressed wounds.  Nursing  cleared pt for participation in physical therapy.  Pt agreeable to PT session.      Pertinent Vitals/Pain Pain Assessment: Faces Faces Pain Scale: Hurts whole lot (2/10 at rest) Pain Location: peri wounds during wiping Pain Descriptors / Indicators: Burning Pain Intervention(s): Limited activity within patient's tolerance;Monitored during session;Repositioned  HR 104-108 bpm with activity.    Home Living                      Prior Function            PT Goals (current goals can now be found in the care plan section) Acute Rehab PT Goals Patient Stated Goal: To return home PT Goal Formulation: With patient Time For Goal Achievement: 11/11/20 Potential to Achieve Goals: Fair Progress towards PT goals: Progressing toward goals    Frequency    Min 2X/week      PT Plan Current plan remains appropriate    Co-evaluation PT/OT/SLP Co-Evaluation/Treatment: Yes Reason for Co-Treatment: For patient/therapist safety;To address functional/ADL transfers PT goals addressed during session: Mobility/safety with mobility;Strengthening/ROM OT goals addressed during session: ADL's and self-care      AM-PAC PT "6 Clicks" Mobility   Outcome Measure  Help needed turning from your back to your side while in a flat bed without using bedrails?: A Little Help needed moving from lying on your back to sitting on the side of a flat bed without using bedrails?: A Little Help needed moving to and from a bed to a chair (including a wheelchair)?: Total Help needed standing up from a chair using your arms (e.g., wheelchair or bedside chair)?: Total Help needed to walk in hospital room?: Total Help needed climbing 3-5 steps with a railing? : Total 6 Click Score: 10    End of Session Equipment Utilized During Treatment: Gait belt Activity Tolerance: Patient limited by pain Patient left: in bed (with nurse present assisting pt with CHG bath and pt care) Nurse Communication: Mobility  status;Precautions;Other (comment) (pt's skin concerns) PT Visit Diagnosis: Other abnormalities of gait and mobility (R26.89);Muscle weakness (generalized) (M62.81);History of falling (Z91.81);Difficulty in walking, not elsewhere classified (R26.2)     Time: 7262-0355 PT Time Calculation (min) (ACUTE ONLY): 42 min  Charges:  $Therapeutic Activity: 8-22 mins                    Leitha Bleak, PT 11/03/20, 4:36 PM

## 2020-11-03 NOTE — Progress Notes (Signed)
PROGRESS NOTE    Jessica Willis  OLM:[id number] DOB: 1966/06/20 DOA: 10/25/2020 PCP: [place]    Assessment & Plan:   Principal Problem:   Failure to thrive in adult Active Problems:   Breast mass, right   Metastatic breast cancer ([place])   Anemia, chronic disease   Iron deficiency anemia   Bone metastasis (HCC)   Port-A-Cath in place   Chemotherapy-induced nausea   Poor appetite   Weakness   Dehydration   COVID-19 virus infection   Protein-calorie malnutrition, severe   Hypernatremia   Acute deep vein thrombosis (DVT) of right femoral vein (HCC)   Pressure injury of skin   Acute right femoral DVT: continue on eliquis   Metastatic triple negative right breast cancer: w/ high tumor burden. Chemotherapy was initiated on 10/14/2020 but progressed after first-line palliative chemotherapy with Taxol. Outpatient follow-up with ophthalmologist for evaluation of possible metastatic disease to extraocular muscles.  Anemia of chronic disease: s/p 1 unit of pRBCs transfused. H&H are labile  Leukocytosis: etiology unclear, reactive vs infection.   Asymptomatic COVID-19 infection: completed IV remdesivir on 10/29/2020. Continue w/ supportive care  Chronic hypoxic respiratory failure: continue on supplemental oxygen, 3L [place]  Severe protein calorie malnutrition, failure to thrive: continue w/ nutritional supplements  Generalized weakness: PT/OT recs SNF but CM is unable to find a SNF for pt so far    Stage II decubitus ulcers of the right buttocks: present on admission. Continue w/ wound care   DVT prophylaxis: eliquis  Code Status: full  Family Communication: pt again refuses for me to call her family to give an update  Disposition Plan: d/c to SNF   Level of care: Med-Surg   Status is: Inpatient  Remains inpatient appropriate because:Unsafe d/c plan   Dispo: The patient is from: Home              Anticipated d/c is to: SNF               Patient currently is medically stable to d/c.   Difficult to place patient Yes        Consultants:   Oncology   Procedures:    Antimicrobials:    Subjective: Pt c/o being very sleepy  Objective: Vitals:   11/02/20 2221 11/03/20 0000 11/03/20 0211 11/03/20 0605  BP: 121/63 (!) 94/58 107/60 (!) 105/58  Pulse: (!) 120 94 94 (!) 102  Resp: 16 16 16 16   Temp: 97.8 F (36.6 C) 98.7 F (37.1 C) 98.5 F (36.9 C) 98.2 F (36.8 C)  TempSrc:   Oral   SpO2: 100% 98% 100% 100%  Weight:      Height:        Intake/Output Summary (Last 24 hours) at 11/03/2020 0719 Last data filed at 11/03/2020 0440 Gross per 24 hour  Intake -  Output 500 ml  Net -500 ml   Filed Weights   10/26/20 0400 10/27/20 0400 10/29/20 0606  Weight: 68.6 kg 69.9 kg 69.4 kg    Examination:  General exam: Appears calm & comfortable   Respiratory system: decreased breath sounds b/l.  Cardiovascular system: S1/S2+. No rubs or gallops. B/l LE edema  Gastrointestinal system: Abd is soft, NT, ND & hypoactive bowel sounds Central nervous system: Alert and oriented. Moves all 4 extremities  Psychiatry: Judgement and insight appear normal. Flat mood and affect    Data Reviewed: I have personally reviewed following labs and imaging studies  CBC: Recent Labs  Lab 10/28/20 0601 10/29/20 0349  10/30/20 0421 11/02/20 0646 11/03/20 0623  WBC 4.5 4.7 7.5 18.4* 19.3*  NEUTROABS 2.4 2.9 4.6  --   --   HGB 8.1* 7.2* 8.4* 9.9* 8.1*  HCT 27.2* 24.9* 28.6* 33.2* 27.0*  MCV 85.8 86.5 87.2 87.4 86.5  PLT 242 243 316 386 726   Basic Metabolic Panel: Recent Labs  Lab 10/28/20 0601 10/29/20 0349 10/30/20 0421 11/03/20 0623  NA 145 145 144 141  K 4.3 3.9 4.2 4.1  CL 103 104 105 103  CO2 33* 34* 32 32  GLUCOSE 95 90 88 78  BUN 27* 23* 24* 23*  CREATININE 0.41* 0.38* 0.40* 0.36*  CALCIUM 8.0* 7.7* 8.1* 8.1*   GFR: Estimated Creatinine Clearance: 84.9 mL/min (A) (by C-G formula based on SCr of 0.36  mg/dL (L)). Liver Function Tests: Recent Labs  Lab 10/28/20 0601 10/29/20 0349 10/30/20 0421 11/03/20 0623  AST 74* 69* 66* 61*  ALT 17 17 17 17   ALKPHOS 296* 285* 288* 306*  BILITOT 0.7 0.5 0.7 0.6  PROT 5.3* 5.0* 5.1* 4.8*  ALBUMIN 2.2* 2.0* 2.0* 1.8*   No results for input(s): LIPASE, AMYLASE in the last 168 hours. No results for input(s): AMMONIA in the last 168 hours. Coagulation Profile: No results for input(s): INR, PROTIME in the last 168 hours. Cardiac Enzymes: No results for input(s): CKTOTAL, CKMB, CKMBINDEX, TROPONINI in the last 168 hours. BNP (last 3 results) No results for input(s): PROBNP in the last 8760 hours. HbA1C: No results for input(s): HGBA1C in the last 72 hours. CBG: Recent Labs  Lab 11/01/20 1627 11/01/20 2118 11/02/20 0755 11/02/20 1311 11/02/20 1548  GLUCAP 124* 101* 87 90 85   Lipid Profile: No results for input(s): CHOL, HDL, LDLCALC, TRIG, CHOLHDL, LDLDIRECT in the last 72 hours. Thyroid Function Tests: No results for input(s): TSH, T4TOTAL, FREET4, T3FREE, THYROIDAB in the last 72 hours. Anemia Panel: No results for input(s): VITAMINB12, FOLATE, FERRITIN, TIBC, IRON, RETICCTPCT in the last 72 hours. Sepsis Labs: No results for input(s): PROCALCITON, LATICACIDVEN in the last 168 hours.  Recent Results (from the past 240 hour(s))  Culture, blood (Routine x 2)     Status: None   Collection Time: 10/25/20  2:49 PM   Specimen: BLOOD  Result Value Ref Range Status   Specimen Description BLOOD BLOOD LEFT HAND  Final   Special Requests   Final    BOTTLES DRAWN AEROBIC AND ANAEROBIC Blood Culture results may not be optimal due to an inadequate volume of blood received in culture bottles   Culture   Final    NO GROWTH 5 DAYS Performed at [facility], [address] [place], [place] [address]    Report Status 10/30/2020 FINAL  Final  Culture, blood (Routine x 2)     Status: None   Collection Time: 10/25/20  3:29 PM    Specimen: BLOOD  Result Value Ref Range Status   Specimen Description BLOOD BLOOD LEFT HAND  Final   Special Requests   Final    BOTTLES DRAWN AEROBIC AND ANAEROBIC Blood Culture adequate volume   Culture   Final    NO GROWTH 5 DAYS Performed at [facility], [address] [place], [place] [address]    Report Status 10/30/2020 FINAL  Final  Resp Panel by RT-PCR (Flu A&B, Covid) Nasopharyngeal Swab     Status: Abnormal   Collection Time: 10/25/20  3:30 PM   Specimen: Nasopharyngeal Swab; Nasopharyngeal(NP) swabs in vial transport medium  Result Value Ref  Range Status   SARS Coronavirus 2 by RT PCR POSITIVE (A) NEGATIVE Final    Comment: RESULT CALLED TO, READ BACK BY AND VERIFIED WITH: laura hernandez 10/25/20 at 1641 by acr (NOTE) SARS-CoV-2 target nucleic acids are DETECTED.  The SARS-CoV-2 RNA is generally detectable in upper respiratory specimens during the acute phase of infection. Positive results are indicative of the presence of the identified virus, but do not rule out bacterial infection or co-infection with other pathogens not detected by the test. Clinical correlation with patient history and other diagnostic information is necessary to determine patient infection status. The expected result is Negative.  Fact Sheet for Patients: EntrepreneurPulse.com.au  Fact Sheet for Healthcare Providers: IncredibleEmployment.be  This test is not yet approved or cleared by the [place] FDA and  has been authorized for detection and/or diagnosis of SARS-CoV-2 by FDA under an Emergency Use Authorization (EUA).  This EUA will remain in effect (meaning this test c an be used) for the duration of  the COVID-19 declaration under Section 564(b)(1) of the Act, 21 U.S.C. section 360bbb-3(b)(1), unless the authorization is terminated or revoked sooner.     Influenza A by PCR NEGATIVE NEGATIVE Final   Influenza B by PCR NEGATIVE  NEGATIVE Final    Comment: (NOTE) The Xpert Xpress SARS-CoV-2/FLU/RSV plus assay is intended as an aid in the diagnosis of influenza from Nasopharyngeal swab specimens and should not be used as a sole basis for treatment. Nasal washings and aspirates are unacceptable for Xpert Xpress SARS-CoV-2/FLU/RSV testing.  Fact Sheet for Patients: EntrepreneurPulse.com.au  Fact Sheet for Healthcare Providers: IncredibleEmployment.be  This test is not yet approved or cleared by the [place] FDA and has been authorized for detection and/or diagnosis of SARS-CoV-2 by FDA under an Emergency Use Authorization (EUA). This EUA will remain in effect (meaning this test can be used) for the duration of the COVID-19 declaration under Section 564(b)(1) of the Act, 21 U.S.C. section 360bbb-3(b)(1), unless the authorization is terminated or revoked.  Performed at [facility], [address], [place], [place] [address]          Radiology Studies: No results found.      Scheduled Meds: . apixaban  5 mg Oral BID  . vitamin C  500 mg Oral Daily  . calcium carbonate  1,250 mg Oral Q breakfast  . Chlorhexidine Gluconate Cloth  6 each Topical Daily  . dronabinol  5 mg Oral BID AC  . feeding supplement  237 mL Oral QID  . insulin aspart  0-5 Units Subcutaneous QHS  . insulin aspart  0-9 Units Subcutaneous TID WC  . magic mouthwash w/lidocaine  15 mL Oral TID  . magnesium chloride  1 tablet Oral Daily  . metoprolol tartrate  50 mg Oral BID  . multivitamin with minerals  1 tablet Oral Daily  . polyethylene glycol  17 g Oral BID  . predniSONE  10 mg Oral Q breakfast  . zinc sulfate  220 mg Oral Daily   Continuous Infusions:   LOS: 8 days    Time spent: 33 mins    Wyvonnia Dusky, MD [facility] Pager [phone]  If 7PM-7AM, please contact night-coverage 11/03/2020, 7:19 AM

## 2020-11-03 NOTE — Progress Notes (Signed)
Occupational Therapy Treatment Patient Details Name: Jessica Willis MRN: [id number] DOB: October 09, 1965 Today's Date: 11/03/2020    History of present illness Pt is a 55 y.o. female presenting to hospital 3/15 with R leg paresthesias and discomfort (pt fell on R side after coming home from hospital and developed symptoms after fall).  Imaging (+) extensive acute DVT R LE extending from proximal R femoral vein through veins of the calf.  Pt admitted with FTT.  (+) COVID-19.  PMH includes anemia, invasive R breast CA, on chronic O2, chemotherapy, PNA, bone metastasis, R arm weakness, tachycardia, metastatic breast CA, VATS.   OT comments  Jessica Willis was seen for OT./PT co-treatment on this date. Upon arrival to room pt asleep in bed, awakes to voice and found to be soiled. Pt agreeable to tx.  MIN A don B socks at bed level. MIN A for L+R rolling, MAX A for perihygiene at bed level. Attempted sit<>stand x2, unable to rise with MAX A x2 - of note, limited with R side assist 2/2 chest dressings. Competed lateral scoot t/f c MAX A. Pt making good progress toward goals. Pt continues to benefit from skilled OT services to maximize return to PLOF and minimize risk of future falls, injury, caregiver burden, and readmission. Will continue to follow POC. Discharge recommendation remains appropriate.    Follow Up Recommendations  SNF    Equipment Recommendations  None recommended by OT    Recommendations for Other Services      Precautions / Restrictions Precautions Precautions: Fall Precaution Comments: Metastatic breast CA with bone metastasis; L chest port Restrictions Weight Bearing Restrictions: No       Mobility Bed Mobility Overal bed mobility: Needs Assistance Bed Mobility: Rolling;Supine to Sit;Sit to Supine Rolling: Min assist   Supine to sit: Min assist;HOB elevated Sit to supine: Min assist   General bed mobility comments: increased time for pt to perform as much on own as  possible; vc's for technique    Transfers Overall transfer level: Needs assistance Equipment used: Rolling walker (2 wheeled);None Transfers: Sit to/from Stand;Lateral/Scoot Transfers Sit to Stand: Total assist;+2 physical assistance;From elevated surface        Lateral/Scoot Transfers: Max assist General transfer comment: unable to stand up to RW with 1 or 2 assist (x2 trials) and bed height elevated; 2 assist provided 1st lateral scoot to L along bed and then max assist x1 (x2 more trials) lateral scooting to L along bed    Balance Overall balance assessment: Needs assistance Sitting-balance support: Single extremity supported;Feet supported Sitting balance-Leahy Scale: Good Sitting balance - Comments: steady sitting reaching within BOS                                   ADL either performed or assessed with clinical judgement   ADL Overall ADL's : Needs assistance/impaired                                       General ADL Comments: MIN A don B socks at bed level. MIN A for L+R rolling, MAX A for perihygiene at bed level.               Cognition Arousal/Alertness: Awake/alert Behavior During Therapy: Flat affect Overall Cognitive Status: Within Functional Limits for tasks assessed  General Comments: Pt tends to avoid eye contact        Exercises Exercises: Other exercises Other Exercises Other Exercises: Pt educated LP:[id number] of Q2 turns for skin protection, d/c recs, falls prevention, ECS, education on importance of mobility for funcitonal strengthening Other Exercises: LBD, toileting, sup<>sit, lateral scoot, rolling, sitting balance/tolerance   Shoulder Instructions       General Comments wounds noted on pt's bottom appearing worse than prior session--RN present during session and addressed wounds    Pertinent Vitals/ Pain       Pain Assessment: Faces Faces Pain Scale: Hurts  whole lot Pain Location: peri wounds during wiping Pain Descriptors / Indicators: Burning Pain Intervention(s): Limited activity within patient's tolerance;Monitored during session         Frequency  Min 1X/week        Progress Toward Goals  OT Goals(current goals can now be found in the care plan section)  Progress towards OT goals: Progressing toward goals  Acute Rehab OT Goals Patient Stated Goal: To return home OT Goal Formulation: With patient Time For Goal Achievement: 11/11/20 Potential to Achieve Goals: Good ADL Goals Pt Will Perform Grooming: with modified independence;sitting Pt Will Perform Lower Body Dressing: with min assist;sitting/lateral leans Pt Will Transfer to Toilet: with min assist;bedside commode;stand pivot transfer Additional ADL Goal #1: Pt will Independenly verbalize plan to implement x3 ECS  Plan Discharge plan remains appropriate;Frequency remains appropriate    Co-evaluation    PT/OT/SLP Co-Evaluation/Treatment: Yes Reason for Co-Treatment: For patient/therapist safety;To address functional/ADL transfers PT goals addressed during session: Mobility/safety with mobility OT goals addressed during session: ADL's and self-care      AM-PAC OT "6 Clicks" Daily Activity     Outcome Measure   Help from another person eating meals?: None Help from another person taking care of personal grooming?: A Little Help from another person toileting, which includes using toliet, bedpan, or urinal?: A Lot Help from another person bathing (including washing, rinsing, drying)?: A Lot Help from another person to put on and taking off regular upper body clothing?: A Little Help from another person to put on and taking off regular lower body clothing?: A Little 6 Click Score: 17    End of Session Equipment Utilized During Treatment: Gait belt;Oxygen  OT Visit Diagnosis: Unsteadiness on feet (R26.81);Repeated falls (R29.6);Muscle weakness (generalized)  (M62.81)   Activity Tolerance Patient limited by fatigue   Patient Left in bed;with call bell/phone within reach;with nursing/sitter in room   Nurse Communication Mobility status        Time: 5329-9242 OT Time Calculation (min): 42 min  Charges: OT General Charges $OT Visit: 1 Visit OT Treatments $Self Care/Home Management : 23-37 mins  Dessie Coma, M.S. OTR/L  11/03/20, 4:43 PM  ascom [phone]

## 2020-11-03 NOTE — Progress Notes (Addendum)
I stopped by the room for follow-up.  Patient stated that she was fatigued and had a constant stream of staff in and out today.  She was not interested in talking at the present time.

## 2020-11-04 DIAGNOSIS — R630 Anorexia: Secondary | ICD-10-CM | POA: Diagnosis not present

## 2020-11-04 DIAGNOSIS — C50919 Malignant neoplasm of unspecified site of unspecified female breast: Secondary | ICD-10-CM | POA: Diagnosis not present

## 2020-11-04 DIAGNOSIS — J9601 Acute respiratory failure with hypoxia: Secondary | ICD-10-CM | POA: Diagnosis not present

## 2020-11-04 DIAGNOSIS — I82411 Acute embolism and thrombosis of right femoral vein: Secondary | ICD-10-CM | POA: Diagnosis not present

## 2020-11-04 DIAGNOSIS — E43 Unspecified severe protein-calorie malnutrition: Secondary | ICD-10-CM | POA: Diagnosis not present

## 2020-11-04 DIAGNOSIS — R627 Adult failure to thrive: Secondary | ICD-10-CM | POA: Diagnosis not present

## 2020-11-04 LAB — COMPREHENSIVE METABOLIC PANEL
ALT: 20 U/L (ref 0–44)
AST: 66 U/L — ABNORMAL HIGH (ref 15–41)
Albumin: 1.9 g/dL — ABNORMAL LOW (ref 3.5–5.0)
Alkaline Phosphatase: 324 U/L — ABNORMAL HIGH (ref 38–126)
Anion gap: 7 (ref 5–15)
BUN: 22 mg/dL — ABNORMAL HIGH (ref 6–20)
CO2: 32 mmol/L (ref 22–32)
Calcium: 8.4 mg/dL — ABNORMAL LOW (ref 8.9–10.3)
Chloride: 104 mmol/L (ref 98–111)
Creatinine, Ser: 0.35 mg/dL — ABNORMAL LOW (ref 0.44–1.00)
GFR, Estimated: >60 mL/min (ref >60)
Glucose, Bld: 102 mg/dL — ABNORMAL HIGH (ref 70–99)
Potassium: 4.1 mmol/L (ref 3.5–5.1)
Sodium: 143 mmol/L (ref 135–145)
Total Bilirubin: 0.6 mg/dL (ref 0.3–1.2)
Total Protein: 5 g/dL — ABNORMAL LOW (ref 6.5–8.1)

## 2020-11-04 LAB — GLUCOSE, CAPILLARY
Glucose-Capillary: 93 mg/dL (ref 70–99)
Glucose-Capillary: 96 mg/dL (ref 70–99)
Glucose-Capillary: 104 mg/dL — ABNORMAL HIGH (ref 70–99)
Glucose-Capillary: 100 mg/dL — ABNORMAL HIGH (ref 70–99)

## 2020-11-04 LAB — CBC
HCT: 28.2 % — ABNORMAL LOW (ref 36.0–46.0)
Hemoglobin: 8.6 g/dL — ABNORMAL LOW (ref 12.0–15.0)
MCH: 26.6 pg (ref 26.0–34.0)
MCHC: 30.5 g/dL (ref 30.0–36.0)
MCV: 87.3 fL (ref 80.0–100.0)
Platelets: 343 10*3/uL (ref 150–400)
RBC: 3.23 MIL/uL — ABNORMAL LOW (ref 3.87–5.11)
RDW: 17.6 % — ABNORMAL HIGH (ref 11.5–15.5)
WBC: 20.9 10*3/uL — ABNORMAL HIGH (ref 4.0–10.5)
nRBC: 0.1 % (ref 0.0–0.2)

## 2020-11-04 MED ORDER — SODIUM CHLORIDE 0.9% FLUSH
10.0000 mL | Freq: Two times a day (BID) | INTRAVENOUS | Status: DC
  Administered 2020-11-04 – 2020-11-12 (×17): 10 mL

## 2020-11-04 MED ORDER — PREDNISONE 10 MG PO TABS
5.0000 mg | ORAL_TABLET | Freq: Every day | ORAL | Status: DC
  Administered 2020-11-06 – 2020-11-12 (×7): 5 mg via ORAL
  Filled 2020-11-04 (×7): qty 1

## 2020-11-04 MED ORDER — SODIUM CHLORIDE 0.9% FLUSH: 10.0000 mL | INTRAVENOUS | Status: DC | PRN

## 2020-11-04 NOTE — Progress Notes (Signed)
.   Hematology/Oncology Progress Note [name] [phone] [phone]  Patient Care Team: [place] as PCP - General Rico Junker, RN as Registered Nurse Benjamine Sprague, DO as Consulting Physician (Surgery)   Name of the patient: Jessica Willis  [id number]  1965/10/20  Date of visit: 11/04/20   INTERVAL HISTORY-  Patient was seen at the bedside.  No acute overnight issues. She reports that breathing is well.  Very weak, shortness of breath with minimal exertion.  Appetite is poor.    Review of systems- Review of Systems  Constitutional: Positive for appetite change and fatigue. Negative for chills and fever.  HENT:   Negative for hearing loss and voice change.   Eyes: Negative for eye problems.  Respiratory: Positive for shortness of breath. Negative for chest tightness and cough.   Cardiovascular: Negative for chest pain.  Gastrointestinal: Negative for abdominal distention, abdominal pain and blood in stool.  Endocrine: Negative for hot flashes.  Genitourinary: Negative for difficulty urinating and frequency.   Musculoskeletal: Negative for arthralgias.  Skin: Negative for itching and rash.  Neurological: Negative for extremity weakness.  Hematological: Negative for adenopathy.  Psychiatric/Behavioral: Negative for confusion.    No Known Allergies  Patient Active Problem List   Diagnosis Date Noted  . Pressure injury of skin 10/27/2020  . Protein-calorie malnutrition, severe 10/26/2020  . Hypernatremia 10/26/2020  . Acute deep vein thrombosis (DVT) of right femoral vein ([place]) 10/26/2020  . Weakness 10/25/2020  . Failure to thrive in adult 10/25/2020  . Dehydration 10/25/2020  . COVID-19 virus infection 10/25/2020  . Chemotherapy-induced nausea   . Poor appetite   . Pneumonia due to infectious organism 10/07/2020  . QT prolongation 09/22/2020  . Pneumonia due to Streptococcus pyogenes ([place])  08/22/2020  . Genetic testing 07/27/2020  . Pancreatic lesion 07/22/2020  . Swelling of lower leg 07/13/2020  . Encounter for antineoplastic chemotherapy 07/04/2020  . Complication of chemotherapy 07/04/2020  . Family history of breast cancer   . Port-A-Cath in place   . Bone metastasis ([place])   . Hypocalcemia   . Folate deficiency   . Gastroesophageal reflux disease without esophagitis   . Weakness of right arm   . Palliative care encounter   . Iron deficiency anemia   . Anemia, chronic disease   . Tachycardia   . Metastatic breast cancer ([place])   . Malignant pleural effusion   . Chest tube in place   . Goals of care, counseling/discussion   . Hemothorax on right 06/16/2020  . Acute respiratory failure with hypoxia ([place])   . Breast mass, right 06/15/2020  . Mastitis 06/15/2020  . Pleural effusion 06/15/2020  . Intestinal obstruction ([place])   . Leukocytosis   . Hypokalemia 06/24/2015  . Sepsis ([place]) 06/23/2015  . CAP (community acquired pneumonia) 06/23/2015  . Partial small bowel obstruction ([place]) 06/23/2015     Past Medical History:  Diagnosis Date  . Anemia   . Family history of breast cancer   . Patient denies medical problems      Past Surgical History:  Procedure Laterality Date  . BREAST BIOPSY  06/28/2020   Procedure: BREAST BIOPSY;  Surgeon: Benjamine Sprague, DO;  Location: [facility] ORS;  Service: General;;  . PORTACATH PLACEMENT N/A 06/28/2020   Procedure: INSERTION PORT-A-CATH;  Surgeon: Benjamine Sprague, DO;  Location: [facility] ORS;  Service: General;  Laterality: N/A;  . TUBAL LIGATION    . VIDEO ASSISTED THORACOSCOPY (VATS)/THOROCOTOMY Right 06/23/2020  Procedure: ATTEMPTED VIDEO ASSISTED THORACOSCOPY (VATS);  Surgeon: Nestor Lewandowsky, MD;  Location: [facility] ORS;  Service: General;  Laterality: Right;    Social History   Socioeconomic History  . Marital status: Single    Spouse name: Not on file  . Number of children: Not on file  . Years of education: Not on file   . Highest education level: Not on file  Occupational History  . Not on file  Tobacco Use  . Smoking status: Never Smoker  . Smokeless tobacco: Never Used  Vaping Use  . Vaping Use: Never used  Substance and Sexual Activity  . Alcohol use: Not Currently    Alcohol/week: 1.0 standard drink    Types: 1 Cans of beer per week  . Drug use: No  . Sexual activity: Not on file  Other Topics Concern  . Not on file  Social History Narrative  . Not on file   Social Determinants of Health   Financial Resource Strain: Not on file  Food Insecurity: Not on file  Transportation Needs: Not on file  Physical Activity: Not on file  Stress: Not on file  Social Connections: Not on file  Intimate Partner Violence: Not on file     Family History  Problem Relation Age of Onset  . Diabetes Other   . Hypertension Other   . Diabetes Maternal Aunt   . Breast cancer Cousin        dx 74s  . Breast cancer Cousin        dx 57s     Current Facility-Administered Medications:  .  albuterol (PROVENTIL) (2.5 MG/3ML) 0.083% nebulizer solution 2.5 mg, 2.5 mg, Nebulization, Q6H PRN, Cox, Amy N, DO .  albuterol (VENTOLIN HFA) 108 (90 Base) MCG/ACT inhaler 2 puff, 2 puff, Inhalation, Q4H PRN, Cox, Amy N, DO .  [COMPLETED] apixaban (ELIQUIS) tablet 10 mg, 10 mg, Oral, BID, 10 mg at 11/01/20 2028 **FOLLOWED BY** apixaban (ELIQUIS) tablet 5 mg, 5 mg, Oral, BID, Jennye Boroughs, MD, 5 mg at 11/04/20 0909 .  ascorbic acid (VITAMIN C) tablet 500 mg, 500 mg, Oral, Daily, Cox, Amy N, DO, 500 mg at 11/04/20 0910 .  calcium carbonate (OS-CAL - dosed in mg of elemental calcium) tablet 1,250 mg, 1,250 mg, Oral, Q breakfast, Cox, Amy N, DO, 1,250 mg at 11/04/20 0910 .  Chlorhexidine Gluconate Cloth 2 % PADS 6 each, 6 each, Topical, Daily, Jennye Boroughs, MD, 6 each at 11/04/20 1253 .  chlorpheniramine-HYDROcodone (TUSSIONEX) 10-8 MG/5ML suspension 5 mL, 5 mL, Oral, Q12H PRN, Cox, Amy N, DO .  dronabinol (MARINOL) capsule  5 mg, 5 mg, Oral, BID AC, Cox, Amy N, DO, 5 mg at 11/04/20 1252 .  feeding supplement (ENSURE ENLIVE / ENSURE PLUS) liquid 237 mL, 237 mL, Oral, QID, Jennye Boroughs, MD, 237 mL at 11/04/20 0910 .  guaiFENesin-dextromethorphan (ROBITUSSIN DM) 100-10 MG/5ML syrup 10 mL, 10 mL, Oral, Q4H PRN, Cox, Amy N, DO .  insulin aspart (novoLOG) injection 0-5 Units, 0-5 Units, Subcutaneous, QHS, Cox, Amy N, DO .  insulin aspart (novoLOG) injection 0-9 Units, 0-9 Units, Subcutaneous, TID WC, Cox, Amy N, DO, 1 Units at 10/30/20 1732 .  magic mouthwash w/lidocaine, 15 mL, Oral, TID, Cox, Amy N, DO, 15 mL at 11/04/20 0911 .  magnesium chloride (SLOW-MAG) 64 MG SR tablet 64 mg, 1 tablet, Oral, Daily, Cox, Amy N, DO, 64 mg at 11/04/20 0909 .  metoprolol tartrate (LOPRESSOR) tablet 50 mg, 50 mg, Oral, BID, Cox, Amy N,  DO, 50 mg at 11/04/20 0909 .  morphine 4 MG/ML injection 4 mg, 4 mg, Intravenous, Q4H PRN, Cox, Amy N, DO, 4 mg at 10/28/20 1853 .  multivitamin with minerals tablet 1 tablet, 1 tablet, Oral, Daily, Cox, Amy N, DO, 1 tablet at 11/04/20 0909 .  ondansetron (ZOFRAN) injection 4 mg, 4 mg, Intravenous, Q8H PRN, Cox, Amy N, DO .  oxyCODONE-acetaminophen (PERCOCET) 7.5-325 MG per tablet 1 tablet, 1 tablet, Oral, Q8H PRN, Cox, Amy N, DO, 1 tablet at 10/25/20 1900 .  polyethylene glycol (MIRALAX / GLYCOLAX) packet 17 g, 17 g, Oral, BID, Cox, Amy N, DO, 17 g at 10/28/20 2157 .  predniSONE (DELTASONE) tablet 10 mg, 10 mg, Oral, Q breakfast, Earlie Server, MD, 10 mg at 11/04/20 0909 .  sodium chloride flush (NS) 0.9 % injection 10 mL, 10 mL, Intravenous, PRN, Jennye Boroughs, MD, 10 mL at 10/30/20 1346 .  sodium chloride flush (NS) 0.9 % injection 10-40 mL, 10-40 mL, Intracatheter, Q12H, Earlie Server, MD .  sodium chloride flush (NS) 0.9 % injection 10-40 mL, 10-40 mL, Intracatheter, PRN, Earlie Server, MD .  zinc sulfate capsule 220 mg, 220 mg, Oral, Daily, Cox, Amy N, DO, 220 mg at 11/04/20 5885   Physical exam:  Vitals:    11/04/20 0449 11/04/20 0816 11/04/20 1250 11/04/20 1534  BP: 105/61 (!) 108/57 (!) 99/52 (!) 102/53  Pulse: 95 (!) 107 94 100  Resp: 20 16 18 16   Temp: 97.8 F (36.6 C) 98.4 F (36.9 C) 99.1 F (37.3 C) 98.4 F (36.9 C)  TempSrc: Oral  Axillary   SpO2: 100% 99% 96% 94%  Weight:      Height:       Physical Exam Constitutional:      General: She is not in acute distress.    Appearance: She is not diaphoretic.     Comments: Cachectic  HENT:     Head: Normocephalic and atraumatic.     Nose: Nose normal.     Mouth/Throat:     Pharynx: No oropharyngeal exudate.  Eyes:     General: No scleral icterus.    Pupils: Pupils are equal, round, and reactive to light.  Cardiovascular:     Rate and Rhythm: Normal rate and regular rhythm.     Heart sounds: No murmur heard.   Pulmonary:     Effort: Pulmonary effort is normal. No respiratory distress.     Breath sounds: No rales.  Chest:     Chest wall: No tenderness.  Abdominal:     General: There is no distension.     Palpations: Abdomen is soft.     Tenderness: There is no abdominal tenderness.  Musculoskeletal:        General: Normal range of motion.     Cervical back: Normal range of motion and neck supple.  Skin:    General: Skin is warm and dry.     Findings: No erythema.  Neurological:     Mental Status: She is alert and oriented to person, place, and time.     Cranial Nerves: No cranial nerve deficit.     Motor: No abnormal muscle tone.     Coordination: Coordination normal.  Psychiatric:        Mood and Affect: Affect normal.   Right breast mass with chronic drainage covered with dressing Multiple small masses on right shoulder   CMP Latest Ref Rng & Units 11/04/2020  Glucose 70 - 99 mg/dL 102(H)  BUN 6 -  20 mg/dL 22(H)  Creatinine 0.44 - 1.00 mg/dL 0.35(L)  Sodium 135 - 145 mmol/L 143  Potassium 3.5 - 5.1 mmol/L 4.1  Chloride 98 - 111 mmol/L 104  CO2 22 - 32 mmol/L 32  Calcium 8.9 - 10.3 mg/dL 8.4(L)  Total  Protein 6.5 - 8.1 g/dL 5.0(L)  Total Bilirubin 0.3 - 1.2 mg/dL 0.6  Alkaline Phos 38 - 126 U/L 324(H)  AST 15 - 41 U/L 66(H)  ALT 0 - 44 U/L 20   CBC Latest Ref Rng & Units 11/04/2020  WBC 4.0 - 10.5 K/uL 20.9(H)  Hemoglobin 12.0 - 15.0 g/dL 8.6(L)  Hematocrit 36.0 - 46.0 % 28.2(L)  Platelets 150 - 400 K/uL 343    RADIOGRAPHIC STUDIES: I have personally reviewed the radiological images as listed and agreed with the findings in the report. DG Chest 2 View  Result Date: 10/07/2020 CLINICAL DATA:  Hypoxia, shortness of breath, metastatic breast cancer EXAM: CHEST - 2 VIEW COMPARISON:  09/13/2020 FINDINGS: LEFT subclavian Port-A-Cath with tip projecting over RIGHT atrium. Enlargement of cardiac silhouette. Pulmonary vascular congestion. Persistent enlargement of RIGHT hilum question mass or adenopathy. Increased infiltrate of the mid to lower RIGHT lung. Small RIGHT pleural effusion again seen. Accentuation of markings in LEFT perihilar region little changed. No pneumothorax or acute osseous findings. IMPRESSION: Increased RIGHT perihilar infiltrate with persistent small RIGHT pleural effusion. Enlargement of RIGHT pulmonary hilum by adenopathy versus perihilar mass. Electronically Signed   By: Lavonia Dana M.D.   On: 10/07/2020 11:32   DG Lumbar Spine Complete  Result Date: 10/25/2020 CLINICAL DATA:  Pain following fall EXAM: LUMBAR SPINE - COMPLETE 4+ VIEW COMPARISON:  None. FINDINGS: Frontal, lateral, spot lumbosacral lateral, and bilateral oblique views were obtained. There are 5 non-rib-bearing lumbar type vertebral bodies. There is no fracture or spondylolisthesis. The disc spaces appear normal. There is no appreciable facet arthropathy. IMPRESSION: No fracture or spondylolisthesis.  No evident arthropathy. Electronically Signed   By: Lowella Grip III M.D.   On: 10/25/2020 14:08   CT Angio Chest PE W and/or Wo Contrast  Result Date: 10/07/2020 CLINICAL DATA:  Breast carcinoma.  Tachycardia and shortness of breath EXAM: CT ANGIOGRAPHY CHEST WITH CONTRAST TECHNIQUE: Multidetector CT imaging of the chest was performed using the standard protocol during bolus administration of intravenous contrast. Multiplanar CT image reconstructions and MIPs were obtained to evaluate the vascular anatomy. CONTRAST:  9mL OMNIPAQUE IOHEXOL 350 MG/ML SOLN COMPARISON:  CT angiogram chest June 15, 2020; chest radiograph October 07, 2020 FINDINGS: Cardiovascular: There is no evident pulmonary embolus. No appreciable thoracic aortic aneurysm or dissection. Visualized great vessels appear unremarkable. There is no appreciable pericardial effusion or pericardial thickening. Port-A-Cath tip is in the superior vena cava near the cavoatrial junction. Mediastinum/Nodes: Thyroid appears normal. There are multiple axillary lymph nodes bilaterally, more severe on the right than on the left. The largest lymph node is seen in the right axillary region measuring 2.7 x 2.1 cm. There is adenopathy in the right supraclavicular and infraclavicular regions with extension of adenopathy into the right Peri carinal region. Largest individual lymph node in these areas measures 2.6 x 1.8 cm. Several left supraclavicular lymph nodes are also noted, largest measuring 1.7 x 1.3 cm. There are several subcentimeter mediastinal lymph nodes. There is extensive retrocrural adenopathy. The largest lymph node or collection of matted lymph nodes is seen inferior to the aorta on the left posteriorly measuring 3.4 x 2.1 cm. No esophageal lesions are evident. Lungs/Pleura: There is extensive  airspace consolidation in portions of the right middle and lower lobes with interspersed areas of loculated pleural effusion. There is ill-defined airspace opacity consistent with pneumonia in the left upper lobe. Loculated fluid tracks along the left major fissure. There may well be associated metastasis within the fissure given the extensive nodularity in  this area. There is a small left pleural effusion with left base atelectasis. Pleural metastases noted along each upper hemithorax, primarily posteriorly as well as in the right apex. Upper Abdomen: Retrocrural adenopathy extends into the upper abdominal region. Visualized upper abdominal structures otherwise appear unremarkable. Musculoskeletal: There is a large mass occupying the right breast and chest wall region with invasion of the deep muscles of the chest wall on the right. There is also invasion of the right axilla. There is extensive subcutaneous thickening in both breast regions. There is a mass in the lateral left breast with extension of soft tissue prominence along the lateral left hemithorax, likely due to extension of neoplasm into the adjacent soft tissues. There is widespread sclerotic bony metastatic disease throughout the thoracic region. Review of the MIP images confirms the above findings. IMPRESSION: 1. No demonstrable pulmonary embolus. No thoracic aortic aneurysm or dissection. 2. Areas of airspace consolidation noted in the right middle and lower lobes. More ill-defined infiltrate is noted in the left upper lobe anteriorly. Areas of loculated fluid noted in the left major fissure. Nodularity in this area could indicate superimposed masses in left major fissure. There is pleural thickening along both posterior upper hemithorax regions, likely pleural metastases. Loculated fluid noted in areas of infiltrate on the right. Small left pleural effusion noted. 3. Adenopathy at multiple sites, most severe in the retrocrural regions bilaterally, right supraclavicular region, and right axillary region, although there is left axillary and left supraclavicular adenopathy. 4. Large mass lesions occupying the right breast with extension and invasion of the chest wall and right axillary regions. A lesser degree of metastatic disease to the left of midline in the subcutaneous tissues and lateral left  breast/lateral left chest wall soft tissues noted. 5. Multifocal sclerotic bony metastatic disease throughout the thoracic region noted. Electronically Signed   By: Lowella Grip III M.D.   On: 10/07/2020 13:23   MR BRAIN W WO CONTRAST  Addendum Date: 10/26/2020   ADDENDUM REPORT: 10/26/2020 13:14 ADDENDUM: The conclusion mentions that breast cancer is thought unlikely for the orbital findings. On further review, given the patient's widespread metastatic disease and given that breast cancer does have a propensity for orbital metastasis (occasionally multifocal/bilateral), metastatic breast cancer is not excluded. The additional differential considerations mentioned in the conclusion remain possibilities, particularly orbital lymphoma given the restricted diffusion. Consider ophthalmology consultation. Findings discussed with Dr. Jennye Boroughs via telephone at 1:10 pm. Electronically Signed   By: Margaretha Sheffield MD   On: 10/26/2020 13:14   Result Date: 10/26/2020 CLINICAL DATA:  Neuro deficit, acute stroke suspected. Right leg paresthesias and discomfort. EXAM: MRI HEAD WITHOUT AND WITH CONTRAST TECHNIQUE: Multiplanar, multiecho pulse sequences of the brain and surrounding structures were obtained without and with intravenous contrast. CONTRAST:  60mL GADAVIST GADOBUTROL 1 MMOL/ML IV SOLN COMPARISON:  MRI November 12 21. FINDINGS: Brain: No acute infarction, hemorrhage, hydrocephalus, extra-axial collection or intracranial mass lesion. No abnormal intracranial enhancement. Vascular: Major arterial flow voids are maintained at the skull base. Skull and upper cervical spine: Increased size/conspicuity of scattered T1 hypointense metastasis in the upper cervical spine. Additionally, increased size of the clival skull metastasis, now measuring 1.4  cm. T1 hypointensity of the right occipital calvarium appears similar to prior and is concerning for osseous metastatic disease. Sinuses/Orbits: Interval development  of marked enlargement of multiple extraocular muscles, including bulky enlargement of the left medial and lateral rectus muscles and right lateral rectus muscle. Additionally, there is evidence of involvement of the right medial rectus muscle anteriorly (series 17, image 26 and series 5, image 19), superior rectus musculature anteriorly bilaterally (series 17, 26; series 5, image 22) and the inferior rectus muscle on the right posteriorly (series 17, image 24; series 5, image 16). The enlarged extra-ocular muscles demonstrate T2 hypointensity and restricted diffusion, suggesting high cellularity. There is involvement of the muscle bodies as well as tendinous insertions in areas. Other: No sizable mastoid effusions. IMPRESSION: 1. No evidence of acute infarct or intracranial brain metastasis. 2. Interval development of marked enlargement of multiple bilateral extraocular muscles, detailed above and most prominently involving the medial and lateral rectus. The enlarged extra-ocular muscles demonstrate T2 hypointensity and restricted diffusion, suggesting high cellularity. While nonspecific, this finding can be seen with orbital lymphoma. Idiopathic orbital inflammatory pseudotumor and IgG4-related orbital disease are additional differential considerations. Metastatic breast cancer was considered given the clinical history, but thought unlikely given the diffuse nature of the process. 3. Partially imaged metastases in the upper cervical spine and clival metastasis appear larger and more conspicuous, concerning for progression of bony metastatic disease. Similar T1 hypointensity right occipital calvarium, concerning for an additional site of osseous metastatic disease. Findings discussed with Dr. Jaclynn Guarneri at 5:52 p.m. via telephone. Electronically Signed: By: Margaretha Sheffield MD On: 10/25/2020 18:17   US Venous Img Lower Unilateral Right (DVT)  Result Date: 10/25/2020 CLINICAL DATA:  Initial evaluation for acute  right lower extremity pain and swelling. EXAM: RIGHT LOWER EXTREMITY VENOUS DOPPLER ULTRASOUND TECHNIQUE: Gray-scale sonography with graded compression, as well as color Doppler and duplex ultrasound were performed to evaluate the lower extremity deep venous systems from the level of the common femoral vein and including the common femoral, femoral, profunda femoral, popliteal and calf veins including the posterior tibial, peroneal and gastrocnemius veins when visible. The superficial great saphenous vein was also interrogated. Spectral Doppler was utilized to evaluate flow at rest and with distal augmentation maneuvers in the common femoral, femoral and popliteal veins. COMPARISON:  None. FINDINGS: Contralateral Common Femoral Vein: Respiratory phasicity is normal and symmetric with the symptomatic side. No evidence of thrombus. Normal compressibility. Common Femoral Vein: No evidence of thrombus. Normal compressibility, respiratory phasicity and response to augmentation. Saphenofemoral Junction: Echogenic thrombus seen at the saphenofemoral junction. Profunda Femoral Vein: No evidence of thrombus. Normal compressibility and flow on color Doppler imaging. Femoral Vein: Echogenic partially occlusive thrombus seen throughout the proximal, mid, and distal right femoral vein. Popliteal Vein: Echogenic occlusive thrombus seen throughout the right popliteal vein. Calf Veins: Echogenic occlusive thrombus seen within the right peroneal and posterior tibial veins. Superficial Great Saphenous Vein: No evidence of thrombus. Normal compressibility. Venous Reflux:  None. Other Findings:  None. IMPRESSION: Positive study with extensive acute DVT extending from the proximal right femoral vein through the veins of the calf. Electronically Signed   By: Jeannine Boga M.D.   On: 10/25/2020 18:40   US Venous Img Upper Uni Right(DVT)  Result Date: 10/08/2020 CLINICAL DATA:  History of stage IV breast cancer now with right  upper extremity pain and edema. Evaluate for DVT. EXAM: RIGHT UPPER EXTREMITY VENOUS DOPPLER ULTRASOUND TECHNIQUE: Gray-scale sonography with graded compression, as well as color Doppler  and duplex ultrasound were performed to evaluate the upper extremity deep venous system from the level of the subclavian vein and including the jugular, axillary, basilic, radial, ulnar and upper cephalic vein. Spectral Doppler was utilized to evaluate flow at rest and with distal augmentation maneuvers. COMPARISON:  None. FINDINGS: Contralateral Subclavian Vein: Respiratory phasicity is normal and symmetric with the symptomatic side. No evidence of thrombus. Normal compressibility. Internal Jugular Vein: No evidence of thrombus. Normal compressibility, respiratory phasicity and response to augmentation. Subclavian Vein: No evidence of thrombus. Normal compressibility, respiratory phasicity and response to augmentation. Axillary Vein: Appears patent where imaged though evaluation degraded secondary to patient's discomfort with sonographic evaluation of the axilla. Cephalic Vein: No evidence of thrombus. Normal compressibility, respiratory phasicity and response to augmentation. Basilic Vein: No evidence of thrombus. Normal compressibility, respiratory phasicity and response to augmentation. Brachial Veins: No evidence of thrombus within either of the paired brachial veins. Normal compressibility, respiratory phasicity and response to augmentation. Radial Veins: No evidence of thrombus. Normal compressibility, respiratory phasicity and response to augmentation. Ulnar Veins: No evidence of thrombus. Normal compressibility, respiratory phasicity and response to augmentation. Venous Reflux:  None visualized. Other Findings: There is a moderate amount of subcutaneous edema at the level of the right upper arm. IMPRESSION: No evidence of DVT within the right upper extremity. Electronically Signed   By: Sandi Mariscal M.D.   On: 10/08/2020  14:29   DG Chest Port 1 View  Result Date: 10/25/2020 CLINICAL DATA:  Chest pain. EXAM: PORTABLE CHEST 1 VIEW COMPARISON:  Single-view of the chest 10/15/2020 and 10/13/2020. CT chest 10/07/2020. FINDINGS: Right pleural effusion and opacities projecting over the right chest consistent with known chest wall and intrathoracic metastatic breast carcinoma again seen. Scattered opacities in the left chest are also again seen. Aeration has improved bilaterally since the most recent exam. Small right pleural effusion is noted. The left lung is clear. Heart size is normal. IMPRESSION: Aeration in both lungs appears improved compared to the most recent study in this patient with extensive metastatic breast cancer. No new abnormality. Electronically Signed   By: Inge Rise M.D.   On: 10/25/2020 12:19   DG Chest Port 1 View  Result Date: 10/15/2020 CLINICAL DATA:  Pneumonia and pleural effusion. Metastatic breast cancer. EXAM: PORTABLE CHEST 1 VIEW COMPARISON:  10/13/2020 chest radiograph and prior studies FINDINGS: Cardiomediastinal silhouette is unchanged with RIGHT mediastinal fullness. A LEFT subclavian Port-A-Cath with tip overlying the UPPER RIGHT atrium again noted. Scattered opacities within both lungs are again noted with a RIGHT pleural effusion. There is no evidence of pneumothorax. No significant changes are identified. IMPRESSION: Unchanged appearance of the chest with scattered opacities within both lungs and RIGHT pleural effusion. Electronically Signed   By: Margarette Canada M.D.   On: 10/15/2020 11:06   DG Chest Port 1 View  Result Date: 10/13/2020 CLINICAL DATA:  Pleural effusion EXAM: PORTABLE CHEST 1 VIEW COMPARISON:  10/07/2020 FINDINGS: Unchanged positioning of left-sided Port-A-Cath. Stable cardiomediastinal contours. Persistent small right pleural effusion. Slight progression of airspace opacity throughout the right lung most pronounced within the right lung base. Similar interstitial  opacities throughout the left lung. No pneumothorax. IMPRESSION: 1. Slight progression of airspace opacity throughout the right lung, most pronounced within the right lung base. 2. Persistent small right pleural effusion. Electronically Signed   By: Davina Poke D.O.   On: 10/13/2020 14:10   ECHOCARDIOGRAM COMPLETE  Result Date: 10/10/2020    ECHOCARDIOGRAM REPORT   Patient Name:  Alisa Graff Date of Exam: 10/10/2020 Medical Rec #:  [id number]           Height:       70.0 in Accession #:    [id number]          Weight:       178.7 lb Date of Birth:  11-03-1965           BSA:          1.990 m Patient Age:    48 years            BP:           119/79 mmHg Patient Gender: F                   HR:           105 bpm. Exam Location:  [facility] Procedure: 2D Echo, Limited Color Doppler and Cardiac Doppler Indications:     R06.03 Acute Respiratory Distress  History:         Patient has no prior history of Echocardiogram examinations.                  Signs/Symptoms:Shortness of Breath. Breast cancer.  Sonographer:     Charmayne Sheer RDCS (AE) Referring Phys:  [id number] Sharen Hones Diagnosing Phys: Kathlyn Sacramento MD  Sonographer Comments: Technically challenging study due to limited acoustic windows. IMPRESSIONS  1. Left ventricular ejection fraction, by estimation, is 50 to 55%. The left ventricle has low normal function. Left ventricular endocardial border not optimally defined to evaluate regional wall motion. Left ventricular diastolic parameters are indeterminate.  2. Right ventricular systolic function is normal. The right ventricular size is normal. Tricuspid regurgitation signal is inadequate for assessing PA pressure.  3. The mitral valve is normal in structure. No evidence of mitral valve regurgitation. No evidence of mitral stenosis.  4. The aortic valve is normal in structure. Aortic valve regurgitation is not visualized. No aortic stenosis is present.  5. The inferior vena cava is normal in size with greater  than 50% respiratory variability, suggesting right atrial pressure of 3 mmHg. FINDINGS  Left Ventricle: Left ventricular ejection fraction, by estimation, is 50 to 55%. The left ventricle has low normal function. Left ventricular endocardial border not optimally defined to evaluate regional wall motion. The left ventricular internal cavity  size was normal in size. There is no left ventricular hypertrophy. Left ventricular diastolic parameters are indeterminate. Right Ventricle: The right ventricular size is normal. No increase in right ventricular wall thickness. Right ventricular systolic function is normal. Tricuspid regurgitation signal is inadequate for assessing PA pressure. Left Atrium: Left atrial size was normal in size. Right Atrium: Right atrial size was normal in size. Pericardium: There is no evidence of pericardial effusion. Mitral Valve: The mitral valve is normal in structure. No evidence of mitral valve regurgitation. No evidence of mitral valve stenosis. MV peak gradient, 4.5 mmHg. The mean mitral valve gradient is 2.0 mmHg. Tricuspid Valve: The tricuspid valve is normal in structure. Tricuspid valve regurgitation is not demonstrated. No evidence of tricuspid stenosis. Aortic Valve: The aortic valve is normal in structure. Aortic valve regurgitation is not visualized. No aortic stenosis is present. Aortic valve mean gradient measures 4.0 mmHg. Aortic valve peak gradient measures 6.8 mmHg. Pulmonic Valve: The pulmonic valve was not well visualized. Pulmonic valve regurgitation is not visualized. No evidence of pulmonic stenosis. Aorta: The aortic root is normal in size and structure. Venous: The inferior vena cava  is normal in size with greater than 50% respiratory variability, suggesting right atrial pressure of 3 mmHg. IAS/Shunts: No atrial level shunt detected by color flow Doppler.   LV Volumes (MOD) LV vol d, MOD A4C: 99.4 ml Diastology LV vol s, MOD A4C: 58.3 ml LV e' lateral:   10.30 cm/s LV  SV MOD A4C:     99.4 ml LV E/e' lateral: 5.8  LEFT ATRIUM           Index LA Vol (A4C): 41.5 ml 20.86 ml/m  AORTIC VALVE AV Vmax:           130.00 cm/s AV Vmean:          93.400 cm/s AV VTI:            0.243 m AV Peak Grad:      6.8 mmHg AV Mean Grad:      4.0 mmHg LVOT Vmax:         92.70 cm/s LVOT Vmean:        60.500 cm/s LVOT VTI:          0.151 m LVOT/AV VTI ratio: 0.62 MITRAL VALVE MV Area (PHT): 6.96 cm    SHUNTS MV Peak grad:  4.5 mmHg    Systemic VTI: 0.15 m MV Mean grad:  2.0 mmHg MV Vmax:       1.06 m/s MV Vmean:      67.9 cm/s MV Decel Time: 109 msec MV E velocity: 59.60 cm/s MV A velocity: 68.60 cm/s MV E/A ratio:  0.87 Kathlyn Sacramento MD Electronically signed by Kathlyn Sacramento MD Signature Date/Time: 10/10/2020/2:00:06 PM    Final    DG HIP UNILAT W OR W/O PELVIS 2-3 VIEWS RIGHT  Result Date: 10/25/2020 CLINICAL DATA:  History of metastatic breast cancer. Patient status post fall. Initial encounter. EXAM: DG HIP (WITH OR WITHOUT PELVIS) 2-3V RIGHT COMPARISON:  PET CT scan 07/19/2020. FINDINGS: No acute bony or joint abnormality is identified. Scattered sclerotic lesions are present in both hips and the pelvis consistent with known metastatic breast cancer. Soft tissues are negative. IMPRESSION: No acute abnormality. Scattered sclerotic lesions consistent with known metastatic breast cancer. Electronically Signed   By: Inge Rise M.D.   On: 10/25/2020 14:09    Assessment and plan-  #Acute right lower extremity proximal DVT  Thrombosis probably due to immobilization secondary to recent hospitalization, widely metastatic cancer, COVID-19 infection etc. continue Eliquis.  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 10/14/2020, status post salvage second line palliative chemotherapy with Adriamycin.  She is currently in visceral crisis.  Prognosis is poor.   Discussed with patient again about CODE STATUS and goals of care. I am concerned that she is getting weaker  and more malnutrition.  Discussed with her that she may not become strong enough to receive any additional chemotherapy.  Discussed about CODE STATUS and she prefers to be kept at full code at this point.  I offered to call and update her family members.  Patient does not want me to call.  #Failure to thrive, severe protein calorie malnutrition. Continue nutrition supplementation. Taper down to prednisone 10 mg daily for 3 days.  #Severe protein calorie malnutrition.    Marinol 5 mg twice daily..  Nutrition supplementation. #Chronic respiratory failure, continue albuterol nebulizer and inhaler.  Nasal cannula oxygen.  Wean as tolerated. #PT OT evaluation.  Likely need to go to rehab. #Right breast wound,decubitus ulcers of the right buttocks -wound care #?metastatic disease to extraocular muscles -ophthalmology  evaluation #Covid infection, finished remdesivir.  CODE STATUS, full code.  Okay from heme-onc aspect to be discharged.  Outpatient follow-up Thank you for allowing me to participate in the care of this patient.   Earlie Server, MD, PhD Hematology Oncology [facility] Pager- [phone] 11/04/2020

## 2020-11-04 NOTE — Progress Notes (Signed)
PROGRESS NOTE    Jessica Willis  WPY:[id number] DOB: June 14, 1966 DOA: 10/25/2020 PCP: [facility]    Assessment & Plan:   Principal Problem:   Failure to thrive in adult Active Problems:   Breast mass, right   Metastatic breast cancer ([place])   Anemia, chronic disease   Iron deficiency anemia   Bone metastasis ([facility])   Port-A-Cath in place   Chemotherapy-induced nausea   Poor appetite   Weakness   Dehydration   COVID-19 virus infection   Protein-calorie malnutrition, severe   Hypernatremia   Acute deep vein thrombosis (DVT) of right femoral vein (HCC)   Pressure injury of skin  Failure to thrive: not eating or drinking much despite encouragement. Pt refuses to let staff speak with family   Acute right femoral DVT: continue on eliquis   Metastatic triple negative right breast cancer: w/ high tumor burden. Chemotherapy was initiated on 10/14/2020 but progressed after first-line palliative chemotherapy with Taxol. Outpatient follow-up with ophthalmologist for evaluation of possible metastatic disease to extraocular muscles.  Anemia of chronic disease: H&H are labile. S/p 1 unit of pRBCs transfused   Leukocytosis: trending up, etiology unclear. No fevers  Asymptomatic COVID-19 infection: completed remdesivir   Chronic hypoxic respiratory failure: continue on supplemental oxygen, 3L [place]   Severe protein calorie malnutrition: continue w/ nutritional supplements   Generalized weakness: PT/OT recs SNF. CM so far has been unable to find SNF placement    Stage II decubitus ulcers of the right buttocks: present on admission. Continue w/ wound care   DVT prophylaxis: eliquis  Code Status: full  Family Communication: pt again refuses for me to call her family to give an update  Disposition Plan: d/c to SNF   Level of care: Med-Surg   Status is: Inpatient  Remains inpatient appropriate because:Unsafe d/c plan   Dispo: The patient is from: Home               Anticipated d/c is to: SNF              Patient currently is medically stable to d/c.   Difficult to place patient Yes        Consultants:   Oncology   Procedures:    Antimicrobials:    Subjective: Pt c/o weakness  Objective: Vitals:   11/03/20 1626 11/03/20 2020 11/04/20 0033 11/04/20 0449  BP: 106/67 107/60 (!) 105/58 105/61  Pulse: (!) 101 (!) 102 (!) 101 95  Resp: 18 18 18 20   Temp:  97.7 F (36.5 C) 98.4 F (36.9 C) 97.8 F (36.6 C)  TempSrc:  Oral  Oral  SpO2: 100% 100% 100% 100%  Weight:      Height:        Intake/Output Summary (Last 24 hours) at 11/04/2020 0718 Last data filed at 11/04/2020 0449 Gross per 24 hour  Intake --  Output 300 ml  Net -300 ml   Filed Weights   10/26/20 0400 10/27/20 0400 10/29/20 0606  Weight: 68.6 kg 69.9 kg 69.4 kg    Examination:  General exam: Appears depressed   Respiratory system: diminished breath sounds b/l otherwise clear Cardiovascular system: S1/S2+. No clicks or gallops  Gastrointestinal system: Abd is soft, NT, ND & hypoactive bowel sounds  Central nervous system: Alert and oriented. Moves all 4 extremities  Psychiatry: Judgement and insight appear normal. Very flat mood and affect    Data Reviewed: I have personally reviewed following labs and imaging studies  CBC: Recent Labs  Lab  10/29/20 0349 10/30/20 0421 11/02/20 0646 11/03/20 0623 11/04/20 0525  WBC 4.7 7.5 18.4* 19.3* 20.9*  NEUTROABS 2.9 4.6  --   --   --   HGB 7.2* 8.4* 9.9* 8.1* 8.6*  HCT 24.9* 28.6* 33.2* 27.0* 28.2*  MCV 86.5 87.2 87.4 86.5 87.3  PLT 243 316 386 355 614   Basic Metabolic Panel: Recent Labs  Lab 10/29/20 0349 10/30/20 0421 11/03/20 0623 11/04/20 0525  NA 145 144 141 143  K 3.9 4.2 4.1 4.1  CL 104 105 103 104  CO2 34* 32 32 32  GLUCOSE 90 88 78 102*  BUN 23* 24* 23* 22*  CREATININE 0.38* 0.40* 0.36* 0.35*  CALCIUM 7.7* 8.1* 8.1* 8.4*   GFR: Estimated Creatinine Clearance: 84.9 mL/min (A)  (by C-G formula based on SCr of 0.35 mg/dL (L)). Liver Function Tests: Recent Labs  Lab 10/29/20 0349 10/30/20 0421 11/03/20 0623 11/04/20 0525  AST 69* 66* 61* 66*  ALT 17 17 17 20   ALKPHOS 285* 288* 306* 324*  BILITOT 0.5 0.7 0.6 0.6  PROT 5.0* 5.1* 4.8* 5.0*  ALBUMIN 2.0* 2.0* 1.8* 1.9*   No results for input(s): LIPASE, AMYLASE in the last 168 hours. No results for input(s): AMMONIA in the last 168 hours. Coagulation Profile: No results for input(s): INR, PROTIME in the last 168 hours. Cardiac Enzymes: No results for input(s): CKTOTAL, CKMB, CKMBINDEX, TROPONINI in the last 168 hours. BNP (last 3 results) No results for input(s): PROBNP in the last 8760 hours. HbA1C: No results for input(s): HGBA1C in the last 72 hours. CBG: Recent Labs  Lab 11/02/20 1548 11/03/20 0819 11/03/20 1156 11/03/20 1626 11/03/20 2021  GLUCAP 85 75  75 90 105* 132*   Lipid Profile: No results for input(s): CHOL, HDL, LDLCALC, TRIG, CHOLHDL, LDLDIRECT in the last 72 hours. Thyroid Function Tests: No results for input(s): TSH, T4TOTAL, FREET4, T3FREE, THYROIDAB in the last 72 hours. Anemia Panel: No results for input(s): VITAMINB12, FOLATE, FERRITIN, TIBC, IRON, RETICCTPCT in the last 72 hours. Sepsis Labs: No results for input(s): PROCALCITON, LATICACIDVEN in the last 168 hours.  Recent Results (from the past 240 hour(s))  Culture, blood (Routine x 2)     Status: None   Collection Time: 10/25/20  2:49 PM   Specimen: BLOOD  Result Value Ref Range Status   Specimen Description BLOOD BLOOD LEFT HAND  Final   Special Requests   Final    BOTTLES DRAWN AEROBIC AND ANAEROBIC Blood Culture results may not be optimal due to an inadequate volume of blood received in culture bottles   Culture   Final    NO GROWTH 5 DAYS Performed at [facility], [address] [place], [place] [address]    Report Status 10/30/2020 FINAL  Final  Culture, blood (Routine x 2)     Status: None    Collection Time: 10/25/20  3:29 PM   Specimen: BLOOD  Result Value Ref Range Status   Specimen Description BLOOD BLOOD LEFT HAND  Final   Special Requests   Final    BOTTLES DRAWN AEROBIC AND ANAEROBIC Blood Culture adequate volume   Culture   Final    NO GROWTH 5 DAYS Performed at [facility], [address] [place], [place] [address]    Report Status 10/30/2020 FINAL  Final  Resp Panel by RT-PCR (Flu A&B, Covid) Nasopharyngeal Swab     Status: Abnormal   Collection Time: 10/25/20  3:30 PM   Specimen: Nasopharyngeal Swab; Nasopharyngeal(NP) swabs  in vial transport medium  Result Value Ref Range Status   SARS Coronavirus 2 by RT PCR POSITIVE (A) NEGATIVE Final    Comment: RESULT CALLED TO, READ BACK BY AND VERIFIED WITH: laura hernandez 10/25/20 at 1641 by acr (NOTE) SARS-CoV-2 target nucleic acids are DETECTED.  The SARS-CoV-2 RNA is generally detectable in upper respiratory specimens during the acute phase of infection. Positive results are indicative of the presence of the identified virus, but do not rule out bacterial infection or co-infection with other pathogens not detected by the test. Clinical correlation with patient history and other diagnostic information is necessary to determine patient infection status. The expected result is Negative.  Fact Sheet for Patients: EntrepreneurPulse.com.au  Fact Sheet for Healthcare Providers: IncredibleEmployment.be  This test is not yet approved or cleared by the [place] FDA and  has been authorized for detection and/or diagnosis of SARS-CoV-2 by FDA under an Emergency Use Authorization (EUA).  This EUA will remain in effect (meaning this test c an be used) for the duration of  the COVID-19 declaration under Section 564(b)(1) of the Act, 21 U.S.C. section 360bbb-3(b)(1), unless the authorization is terminated or revoked sooner.     Influenza A by PCR NEGATIVE NEGATIVE Final    Influenza B by PCR NEGATIVE NEGATIVE Final    Comment: (NOTE) The Xpert Xpress SARS-CoV-2/FLU/RSV plus assay is intended as an aid in the diagnosis of influenza from Nasopharyngeal swab specimens and should not be used as a sole basis for treatment. Nasal washings and aspirates are unacceptable for Xpert Xpress SARS-CoV-2/FLU/RSV testing.  Fact Sheet for Patients: EntrepreneurPulse.com.au  Fact Sheet for Healthcare Providers: IncredibleEmployment.be  This test is not yet approved or cleared by the [place] FDA and has been authorized for detection and/or diagnosis of SARS-CoV-2 by FDA under an Emergency Use Authorization (EUA). This EUA will remain in effect (meaning this test can be used) for the duration of the COVID-19 declaration under Section 564(b)(1) of the Act, 21 U.S.C. section 360bbb-3(b)(1), unless the authorization is terminated or revoked.  Performed at [facility], [address] [place], [place] [address]          Radiology Studies: No results found.      Scheduled Meds: . apixaban  5 mg Oral BID  . vitamin C  500 mg Oral Daily  . calcium carbonate  1,250 mg Oral Q breakfast  . Chlorhexidine Gluconate Cloth  6 each Topical Daily  . dronabinol  5 mg Oral BID AC  . feeding supplement  237 mL Oral QID  . insulin aspart  0-5 Units Subcutaneous QHS  . insulin aspart  0-9 Units Subcutaneous TID WC  . magic mouthwash w/lidocaine  15 mL Oral TID  . magnesium chloride  1 tablet Oral Daily  . metoprolol tartrate  50 mg Oral BID  . multivitamin with minerals  1 tablet Oral Daily  . polyethylene glycol  17 g Oral BID  . predniSONE  10 mg Oral Q breakfast  . zinc sulfate  220 mg Oral Daily   Continuous Infusions:   LOS: 9 days    Time spent: 30 mins    Wyvonnia Dusky, MD [facility] Pager [phone]  If 7PM-7AM, please contact night-coverage 11/04/2020, 7:18 AM

## 2020-11-05 DIAGNOSIS — I82411 Acute embolism and thrombosis of right femoral vein: Secondary | ICD-10-CM | POA: Diagnosis not present

## 2020-11-05 DIAGNOSIS — C50919 Malignant neoplasm of unspecified site of unspecified female breast: Secondary | ICD-10-CM | POA: Diagnosis not present

## 2020-11-05 DIAGNOSIS — R627 Adult failure to thrive: Secondary | ICD-10-CM | POA: Diagnosis not present

## 2020-11-05 LAB — CBC
HCT: 27.3 % — ABNORMAL LOW (ref 36.0–46.0)
Hemoglobin: 8.1 g/dL — ABNORMAL LOW (ref 12.0–15.0)
MCH: 26 pg (ref 26.0–34.0)
MCHC: 29.7 g/dL — ABNORMAL LOW (ref 30.0–36.0)
MCV: 87.8 fL (ref 80.0–100.0)
Platelets: 321 10*3/uL (ref 150–400)
RBC: 3.11 MIL/uL — ABNORMAL LOW (ref 3.87–5.11)
RDW: 17.9 % — ABNORMAL HIGH (ref 11.5–15.5)
WBC: 22.6 10*3/uL — ABNORMAL HIGH (ref 4.0–10.5)
nRBC: 0.1 % (ref 0.0–0.2)

## 2020-11-05 LAB — COMPREHENSIVE METABOLIC PANEL
ALT: 21 U/L (ref 0–44)
AST: 68 U/L — ABNORMAL HIGH (ref 15–41)
Albumin: 1.8 g/dL — ABNORMAL LOW (ref 3.5–5.0)
Alkaline Phosphatase: 314 U/L — ABNORMAL HIGH (ref 38–126)
Anion gap: 8 (ref 5–15)
BUN: 24 mg/dL — ABNORMAL HIGH (ref 6–20)
CO2: 32 mmol/L (ref 22–32)
Calcium: 8.3 mg/dL — ABNORMAL LOW (ref 8.9–10.3)
Chloride: 102 mmol/L (ref 98–111)
Creatinine, Ser: 0.35 mg/dL — ABNORMAL LOW (ref 0.44–1.00)
GFR, Estimated: >60 mL/min (ref >60)
Glucose, Bld: 83 mg/dL (ref 70–99)
Potassium: 4.2 mmol/L (ref 3.5–5.1)
Sodium: 142 mmol/L (ref 135–145)
Total Bilirubin: 0.6 mg/dL (ref 0.3–1.2)
Total Protein: 5.2 g/dL — ABNORMAL LOW (ref 6.5–8.1)

## 2020-11-05 LAB — GLUCOSE, CAPILLARY
Glucose-Capillary: 75 mg/dL (ref 70–99)
Glucose-Capillary: 244 mg/dL — ABNORMAL HIGH (ref 70–99)
Glucose-Capillary: 96 mg/dL (ref 70–99)
Glucose-Capillary: 77 mg/dL (ref 70–99)
Glucose-Capillary: 91 mg/dL (ref 70–99)

## 2020-11-05 LAB — HEMOGLOBIN A1C
Hgb A1c MFr Bld: 5.9 % — ABNORMAL HIGH (ref 4.8–5.6)
Mean Plasma Glucose: 122.63 mg/dL

## 2020-11-05 MED ORDER — INSULIN ASPART 100 UNIT/ML ~~LOC~~ SOLN
0.0000 [IU] | Freq: Three times a day (TID) | SUBCUTANEOUS | Status: DC
  Administered 2020-11-07 – 2020-11-09 (×3): 1 [IU] via SUBCUTANEOUS
  Filled 2020-11-05 (×3): qty 1

## 2020-11-05 MED ORDER — METOPROLOL TARTRATE 50 MG PO TABS
50.0000 mg | ORAL_TABLET | Freq: Two times a day (BID) | ORAL | Status: DC
  Administered 2020-11-05 – 2020-11-12 (×15): 50 mg via ORAL
  Filled 2020-11-05 (×15): qty 1

## 2020-11-05 MED ORDER — DRONABINOL 2.5 MG PO CAPS
5.0000 mg | ORAL_CAPSULE | Freq: Two times a day (BID) | ORAL | Status: DC
  Administered 2020-11-05 – 2020-11-12 (×14): 5 mg via ORAL
  Filled 2020-11-05 (×14): qty 2

## 2020-11-05 MED ORDER — INSULIN ASPART 100 UNIT/ML ~~LOC~~ SOLN: 0.0000 [IU] | Freq: Every day | SUBCUTANEOUS | Status: DC

## 2020-11-05 MED ORDER — MORPHINE SULFATE (PF) 4 MG/ML IV SOLN
4.0000 mg | INTRAVENOUS | Status: DC | PRN
  Administered 2020-11-10: 4 mg via INTRAVENOUS
  Filled 2020-11-05: qty 1

## 2020-11-05 MED ORDER — MAGIC MOUTHWASH: 5.0000 mL | Freq: Three times a day (TID) | ORAL | Status: DC | PRN

## 2020-11-05 MED ORDER — INSULIN ASPART 100 UNIT/ML ~~LOC~~ SOLN
0.0000 [IU] | Freq: Three times a day (TID) | SUBCUTANEOUS | Status: DC
  Administered 2020-11-05: 2 [IU] via SUBCUTANEOUS

## 2020-11-05 MED ORDER — POLYETHYLENE GLYCOL 3350 17 G PO PACK
17.0000 g | PACK | Freq: Two times a day (BID) | ORAL | Status: DC
  Administered 2020-11-05: 09:00:00 17 g via ORAL
  Filled 2020-11-05 (×9): qty 1

## 2020-11-05 MED ORDER — MAGNESIUM CHLORIDE 64 MG PO TBEC
1.0000 | DELAYED_RELEASE_TABLET | Freq: Every day | ORAL | Status: DC
  Administered 2020-11-05 – 2020-11-12 (×8): 64 mg via ORAL
  Filled 2020-11-05 (×8): qty 1

## 2020-11-05 MED ORDER — CALCIUM CARBONATE 1250 (500 CA) MG PO TABS
1.0000 | ORAL_TABLET | Freq: Every day | ORAL | Status: DC
  Administered 2020-11-05 – 2020-11-12 (×8): 500 mg via ORAL
  Filled 2020-11-05 (×8): qty 1

## 2020-11-05 MED ORDER — ASCORBIC ACID 500 MG PO TABS
500.0000 mg | ORAL_TABLET | Freq: Every day | ORAL | Status: DC
  Administered 2020-11-05 – 2020-11-12 (×8): 500 mg via ORAL
  Filled 2020-11-05 (×8): qty 1

## 2020-11-05 MED ORDER — OXYCODONE-ACETAMINOPHEN 7.5-325 MG PO TABS
1.0000 | ORAL_TABLET | Freq: Three times a day (TID) | ORAL | Status: DC | PRN
Start: 2020-11-05 — End: 2020-11-12

## 2020-11-05 MED ORDER — ENSURE ENLIVE PO LIQD
237.0000 mL | Freq: Four times a day (QID) | ORAL | Status: DC
  Administered 2020-11-05 – 2020-11-12 (×18): 237 mL via ORAL

## 2020-11-05 MED ORDER — CHLORHEXIDINE GLUCONATE CLOTH 2 % EX PADS
6.0000 | MEDICATED_PAD | Freq: Every day | CUTANEOUS | Status: DC
  Administered 2020-11-05 – 2020-11-12 (×7): 6 via TOPICAL

## 2020-11-05 MED ORDER — APIXABAN 5 MG PO TABS
5.0000 mg | ORAL_TABLET | Freq: Two times a day (BID) | ORAL | Status: DC
  Administered 2020-11-05 – 2020-11-12 (×15): 5 mg via ORAL
  Filled 2020-11-05 (×15): qty 1

## 2020-11-05 NOTE — Progress Notes (Signed)
   11/04/20 1931  Assess: MEWS Score  Temp 98.2 F (36.8 C)  BP (!) 112/54  Pulse Rate (!) 115  Resp 18  SpO2 96 %  O2 Device Nasal Cannula  O2 Flow Rate (L/min) 1.5 L/min  Assess: MEWS Score  MEWS Temp 0  MEWS Systolic 0  MEWS Pulse 2  MEWS RR 0  MEWS LOC 0  MEWS Score 2  MEWS Score Color Yellow  Assess: if the MEWS score is Yellow or Red  Were vital signs taken at a resting state? Yes  Focused Assessment No change from prior assessment  Early Detection of Sepsis Score *See Row Information* Low  MEWS guidelines implemented *See Row Information* Yes  Treat  MEWS Interventions Escalated (See documentation below)  Take Vital Signs  Increase Vital Sign Frequency  Yellow: Q 2hr X 2 then Q 4hr X 2, if remains yellow, continue Q 4hrs  Escalate  MEWS: Escalate Yellow: discuss with charge nurse/RN and consider discussing with provider and RRT  Notify: Charge Nurse/RN  Name of Charge Nurse/RN Notified Mariane Baumgarten, RN  Date Charge Nurse/RN Notified 11/04/20  Time Charge Nurse/RN Notified 1945  Notify: Provider  Provider Name/Title B. Randol Kern, NP  Date Provider Notified 11/04/20  Time Provider Notified 2125  Notification Type  (secure chat)  Notification Reason  (yellows mews P 117 BP 106/52)  Provider response No new orders  Date of Provider Response 11/04/20  Document  Patient Outcome Other (Comment) (pt stable- continue to monitor)  Inserted for [place]

## 2020-11-05 NOTE — Progress Notes (Signed)
PROGRESS NOTE    Jessica Willis  ONG:[id number] DOB: 10-22-1965 DOA: 10/25/2020 PCP: [facility]    Assessment & Plan:   Principal Problem:   Failure to thrive in adult Active Problems:   Breast mass, right   Metastatic breast cancer ([place])   Anemia, chronic disease   Iron deficiency anemia   Bone metastasis (HCC)   Port-A-Cath in place   Chemotherapy-induced nausea   Poor appetite   Weakness   Dehydration   COVID-19 virus infection   Protein-calorie malnutrition, severe   Hypernatremia   Acute deep vein thrombosis (DVT) of right femoral vein (HCC)   Pressure injury of skin  Failure to thrive: still not eating or drinking much. 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 Poor prognosis. Chemotherapy was initiated on 10/14/2020 but progressed after first-line palliative chemotherapy with Taxol. Outpatient follow-up with ophthalmologist for evaluation of possible metastatic disease to extraocular muscles.  Anemia of chronic disease: s/p 1 unit of pRBCs transfused. Will continue to monitor   Leukocytosis: etiology unclear. Trending up. Blood cx NGTD   Asymptomatic COVID-19 infection: completed remdesivir   Chronic hypoxic respiratory failure: continue on supplemental oxygen, 3L [place]    Severe protein calorie malnutrition: continue w/ nutritional supplements   Generalized weakness: PT/OT recs SNF. CM has been unable to find SNF placement   Stage II decubitus ulcers of the right buttocks: present on admission. Continue w/ wound care   DVT prophylaxis: eliquis  Code Status: full  Family Communication: pt again refuses for me to call her family to give an update  Disposition Plan: d/c to SNF   Level of care: Med-Surg   Status is: Inpatient  Remains inpatient appropriate because:Unsafe d/c plan   Dispo: The patient is from:  Home              Anticipated d/c is to: SNF              Patient currently is medically stable to d/c.   Difficult to place patient Yes        Consultants:   Oncology   Procedures:    Antimicrobials:    Subjective: Pt c/o fatigue  Objective: Vitals:   11/04/20 1931 11/04/20 2123 11/05/20 0031 11/05/20 0516  BP: (!) 112/54 (!) 106/52 114/60 114/64  Pulse: (!) 115 (!) 117 (!) 116 (!) 112  Resp: 18 18 16 16   Temp: 98.2 F (36.8 C) 97.9 F (36.6 C) 98.6 F (37 C) 99.1 F (37.3 C)  TempSrc: Oral Oral Oral Axillary  SpO2: 96% 98% 96% 99%  Weight:      Height:        Intake/Output Summary (Last 24 hours) at 11/05/2020 0721 Last data filed at 11/05/2020 0518 Gross per 24 hour  Intake -  Output 450 ml  Net -450 ml   Filed Weights   10/26/20 0400 10/27/20 0400 10/29/20 0606  Weight: 68.6 kg 69.9 kg 69.4 kg    Examination:  General exam:Appears calm but depressed  Respiratory system: decreased breath sounds b/l  Cardiovascular system: S1 & S2+. No rubs or gallops Gastrointestinal system: Abd is soft, NT, ND & hypoactive bowel sounds  Central nervous system: Alert and oriented. Moves all extremities  Psychiatry: Judgement and insight appear normal. Very flat mood and affect    Data Reviewed: I have personally reviewed following labs and imaging  studies  CBC: Recent Labs  Lab 10/30/20 0421 11/02/20 0646 11/03/20 0623 11/04/20 0525 11/05/20 0615  WBC 7.5 18.4* 19.3* 20.9* 22.6*  NEUTROABS 4.6  --   --   --   --   HGB 8.4* 9.9* 8.1* 8.6* 8.1*  HCT 28.6* 33.2* 27.0* 28.2* 27.3*  MCV 87.2 87.4 86.5 87.3 87.8  PLT 316 386 355 343 785   Basic Metabolic Panel: Recent Labs  Lab 10/30/20 0421 11/03/20 0623 11/04/20 0525 11/05/20 0615  NA 144 141 143 142  K 4.2 4.1 4.1 4.2  CL 105 103 104 102  CO2 32 32 32 32  GLUCOSE 88 78 102* 83  BUN 24* 23* 22* 24*  CREATININE 0.40* 0.36* 0.35* 0.35*  CALCIUM 8.1* 8.1* 8.4* 8.3*   GFR: Estimated Creatinine  Clearance: 84.9 mL/min (A) (by C-G formula based on SCr of 0.35 mg/dL (L)). Liver Function Tests: Recent Labs  Lab 10/30/20 0421 11/03/20 0623 11/04/20 0525 11/05/20 0615  AST 66* 61* 66* 68*  ALT 17 17 20 21   ALKPHOS 288* 306* 324* 314*  BILITOT 0.7 0.6 0.6 0.6  PROT 5.1* 4.8* 5.0* 5.2*  ALBUMIN 2.0* 1.8* 1.9* 1.8*   No results for input(s): LIPASE, AMYLASE in the last 168 hours. No results for input(s): AMMONIA in the last 168 hours. Coagulation Profile: No results for input(s): INR, PROTIME in the last 168 hours. Cardiac Enzymes: No results for input(s): CKTOTAL, CKMB, CKMBINDEX, TROPONINI in the last 168 hours. BNP (last 3 results) No results for input(s): PROBNP in the last 8760 hours. HbA1C: No results for input(s): HGBA1C in the last 72 hours. CBG: Recent Labs  Lab 11/03/20 2021 11/04/20 0815 11/04/20 1221 11/04/20 1624 11/04/20 2054  GLUCAP 132* 93 96 104* 100*   Lipid Profile: No results for input(s): CHOL, HDL, LDLCALC, TRIG, CHOLHDL, LDLDIRECT in the last 72 hours. Thyroid Function Tests: No results for input(s): TSH, T4TOTAL, FREET4, T3FREE, THYROIDAB in the last 72 hours. Anemia Panel: No results for input(s): VITAMINB12, FOLATE, FERRITIN, TIBC, IRON, RETICCTPCT in the last 72 hours. Sepsis Labs: No results for input(s): PROCALCITON, LATICACIDVEN in the last 168 hours.  No results found for this or any previous visit (from the past 240 hour(s)).       Radiology Studies: No results found.      Scheduled Meds: . apixaban  5 mg Oral BID  . vitamin C  500 mg Oral Daily  . calcium carbonate  1,250 mg Oral Q breakfast  . Chlorhexidine Gluconate Cloth  6 each Topical Daily  . dronabinol  5 mg Oral BID AC  . feeding supplement  237 mL Oral QID  . insulin aspart  0-5 Units Subcutaneous QHS  . insulin aspart  0-9 Units Subcutaneous TID WC  . magic mouthwash w/lidocaine  15 mL Oral TID  . magnesium chloride  1 tablet Oral Daily  . metoprolol  tartrate  50 mg Oral BID  . multivitamin with minerals  1 tablet Oral Daily  . polyethylene glycol  17 g Oral BID  . predniSONE  10 mg Oral Q breakfast  . [START ON 11/06/2020] predniSONE  5 mg Oral Q breakfast  . sodium chloride flush  10-40 mL Intracatheter Q12H  . zinc sulfate  220 mg Oral Daily   Continuous Infusions:   LOS: 10 days    Time spent: 32 mins    Wyvonnia Dusky, MD [facility] Pager [phone]  If 7PM-7AM, please contact night-coverage 11/05/2020, 7:21 AM

## 2020-11-06 DIAGNOSIS — R627 Adult failure to thrive: Secondary | ICD-10-CM | POA: Diagnosis not present

## 2020-11-06 DIAGNOSIS — I82411 Acute embolism and thrombosis of right femoral vein: Secondary | ICD-10-CM | POA: Diagnosis not present

## 2020-11-06 DIAGNOSIS — C50919 Malignant neoplasm of unspecified site of unspecified female breast: Secondary | ICD-10-CM | POA: Diagnosis not present

## 2020-11-06 LAB — COMPREHENSIVE METABOLIC PANEL
ALT: 23 U/L (ref 0–44)
AST: 66 U/L — ABNORMAL HIGH (ref 15–41)
Albumin: 1.8 g/dL — ABNORMAL LOW (ref 3.5–5.0)
Alkaline Phosphatase: 326 U/L — ABNORMAL HIGH (ref 38–126)
Anion gap: 7 (ref 5–15)
BUN: 22 mg/dL — ABNORMAL HIGH (ref 6–20)
CO2: 31 mmol/L (ref 22–32)
Calcium: 8.1 mg/dL — ABNORMAL LOW (ref 8.9–10.3)
Chloride: 101 mmol/L (ref 98–111)
Creatinine, Ser: <0.3 mg/dL — ABNORMAL LOW (ref 0.44–1.00)
Glucose, Bld: 83 mg/dL (ref 70–99)
Potassium: 4.1 mmol/L (ref 3.5–5.1)
Sodium: 139 mmol/L (ref 135–145)
Total Bilirubin: 0.8 mg/dL (ref 0.3–1.2)
Total Protein: 5 g/dL — ABNORMAL LOW (ref 6.5–8.1)

## 2020-11-06 LAB — CBC
HCT: 27.5 % — ABNORMAL LOW (ref 36.0–46.0)
Hemoglobin: 8 g/dL — ABNORMAL LOW (ref 12.0–15.0)
MCH: 25.6 pg — ABNORMAL LOW (ref 26.0–34.0)
MCHC: 29.1 g/dL — ABNORMAL LOW (ref 30.0–36.0)
MCV: 87.9 fL (ref 80.0–100.0)
Platelets: 309 10*3/uL (ref 150–400)
RBC: 3.13 MIL/uL — ABNORMAL LOW (ref 3.87–5.11)
RDW: 17.9 % — ABNORMAL HIGH (ref 11.5–15.5)
WBC: 23.5 10*3/uL — ABNORMAL HIGH (ref 4.0–10.5)
nRBC: 0 % (ref 0.0–0.2)

## 2020-11-06 LAB — GLUCOSE, CAPILLARY
Glucose-Capillary: 80 mg/dL (ref 70–99)
Glucose-Capillary: 79 mg/dL (ref 70–99)
Glucose-Capillary: 83 mg/dL (ref 70–99)
Glucose-Capillary: 61 mg/dL — ABNORMAL LOW (ref 70–99)
Glucose-Capillary: 71 mg/dL (ref 70–99)

## 2020-11-06 NOTE — Progress Notes (Signed)
PROGRESS NOTE    Jessica Willis  IPJ:[id number] DOB: [birth year] DOA: 10/25/2020 PCP: [facility]    Assessment & Plan:   Principal Problem:   Failure to thrive in adult Active Problems:   Breast mass, right   Metastatic breast cancer ([place])   Anemia, chronic disease   Iron deficiency anemia   Bone metastasis (HCC)   Port-A-Cath in place   Chemotherapy-induced nausea   Poor appetite   Weakness   Dehydration   COVID-19 virus infection   Protein-calorie malnutrition, severe   Hypernatremia   Acute deep vein thrombosis (DVT) of right femoral vein (HCC)   Pressure injury of skin  Failure to thrive: still not eating or drinking much. 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 Poor prognosis. Chemotherapy was initiated on 10/14/2020 but progressed after first-line palliative chemotherapy with taxol. Outpatient follow-up with ophthalmologist for evaluation of possible metastatic disease to extraocular muscles.  Anemia of chronic disease: H&H are stable. S/p 1 unit of pRBCs transfused. Will transfuse if Hb < 7.0.  Leukocytosis: likely steroid induced   Asymptomatic COVID-19 infection: completed remdesivir   Chronic hypoxic respiratory failure:  On 3L [place], continue on supplemental oxygen    Severe protein calorie malnutrition: continue w/ nutritional supplements    Generalized weakness: therapy recs SNF. CM is still working on SNF placement   Stage II decubitus ulcers of the right buttocks: present on admission. Continue w/ wound care.   DVT prophylaxis: eliquis  Code Status: full  Family Communication: pt again refuses for me to call her family to give an update  Disposition Plan: d/c to SNF   Level of care: Med-Surg   Status is: Inpatient  Remains inpatient appropriate because:Unsafe d/c plan, waiting on SNF  placement still    Dispo: The patient is from: Home              Anticipated d/c is to: SNF              Patient currently is medically stable to d/c.   Difficult to place patient Yes        Consultants:   Oncology   Procedures:    Antimicrobials:    Subjective: Pt c/o dry mouth  Objective: Vitals:   11/05/20 1605 11/05/20 2010 11/06/20 0044 11/06/20 0616  BP: 118/63 123/68 119/69 119/68  Pulse: (!) 103 (!) 108 (!) 102 (!) 102  Resp: 14 16 15 16   Temp: 97.9 F (36.6 C) 98.8 F (37.1 C) 99.3 F (37.4 C)   TempSrc:  Axillary Axillary   SpO2: 98% 99% 100% 100%  Weight:      Height:        Intake/Output Summary (Last 24 hours) at 11/06/2020 0718 Last data filed at 11/05/2020 1828 Gross per 24 hour  Intake 420 ml  Output -  Net 420 ml   Filed Weights   10/26/20 0400 10/27/20 0400 10/29/20 0606  Weight: 68.6 kg 69.9 kg 69.4 kg    Examination:  General exam: appears comfortable but depressed Respiratory system: diminished breath sounds b/l  Cardiovascular system: S1/S2+. No clicks or rubs  Gastrointestinal system: Abd is soft, NT, ND & hypoactive bowel sounds  Central nervous system: Alert & oriented. Moves all 4 extremities  Psychiatry: judgement and insight appear normal. Flat mood and affect    Data Reviewed: I have personally  reviewed following labs and imaging studies  CBC: Recent Labs  Lab 11/02/20 0646 11/03/20 0623 11/04/20 0525 11/05/20 0615 11/06/20 0421  WBC 18.4* 19.3* 20.9* 22.6* 23.5*  HGB 9.9* 8.1* 8.6* 8.1* 8.0*  HCT 33.2* 27.0* 28.2* 27.3* 27.5*  MCV 87.4 86.5 87.3 87.8 87.9  PLT 386 355 343 321 496   Basic Metabolic Panel: Recent Labs  Lab 11/03/20 0623 11/04/20 0525 11/05/20 0615 11/06/20 0421  NA 141 143 142 139  K 4.1 4.1 4.2 4.1  CL 103 104 102 101  CO2 32 32 32 31  GLUCOSE 78 102* 83 83  BUN 23* 22* 24* 22*  CREATININE 0.36* 0.35* 0.35* <0.30*  CALCIUM 8.1* 8.4* 8.3* 8.1*   GFR: CrCl cannot be calculated  (This lab value cannot be used to calculate CrCl because it is not a number: <0.30). Liver Function Tests: Recent Labs  Lab 11/03/20 0623 11/04/20 0525 11/05/20 0615 11/06/20 0421  AST 61* 66* 68* 66*  ALT 17 20 21 23   ALKPHOS 306* 324* 314* 326*  BILITOT 0.6 0.6 0.6 0.8  PROT 4.8* 5.0* 5.2* 5.0*  ALBUMIN 1.8* 1.9* 1.8* 1.8*   No results for input(s): LIPASE, AMYLASE in the last 168 hours. No results for input(s): AMMONIA in the last 168 hours. Coagulation Profile: No results for input(s): INR, PROTIME in the last 168 hours. Cardiac Enzymes: No results for input(s): CKTOTAL, CKMB, CKMBINDEX, TROPONINI in the last 168 hours. BNP (last 3 results) No results for input(s): PROBNP in the last 8760 hours. HbA1C: Recent Labs    11/05/20 0615  HGBA1C 5.9*   CBG: Recent Labs  Lab 11/05/20 0756 11/05/20 1208 11/05/20 1610 11/05/20 2010 11/05/20 2105  GLUCAP 75 244* 96 77 91   Lipid Profile: No results for input(s): CHOL, HDL, LDLCALC, TRIG, CHOLHDL, LDLDIRECT in the last 72 hours. Thyroid Function Tests: No results for input(s): TSH, T4TOTAL, FREET4, T3FREE, THYROIDAB in the last 72 hours. Anemia Panel: No results for input(s): VITAMINB12, FOLATE, FERRITIN, TIBC, IRON, RETICCTPCT in the last 72 hours. Sepsis Labs: No results for input(s): PROCALCITON, LATICACIDVEN in the last 168 hours.  No results found for this or any previous visit (from the past 240 hour(s)).       Radiology Studies: No results found.      Scheduled Meds: . apixaban  5 mg Oral BID  . vitamin C  500 mg Oral Daily  . calcium carbonate  1 tablet Oral Q breakfast  . Chlorhexidine Gluconate Cloth  6 each Topical Daily  . dronabinol  5 mg Oral BID AC  . feeding supplement  237 mL Oral QID  . insulin aspart  0-5 Units Subcutaneous QHS  . insulin aspart  0-9 Units Subcutaneous TID WC  . magnesium chloride  1 tablet Oral Daily  . metoprolol tartrate  50 mg Oral BID  . polyethylene glycol  17 g  Oral BID  . predniSONE  5 mg Oral Q breakfast  . sodium chloride flush  10-40 mL Intracatheter Q12H   Continuous Infusions:   LOS: 11 days    Time spent: 20 mins    Wyvonnia Dusky, MD [facility] Pager [phone]  If 7PM-7AM, please contact night-coverage 11/06/2020, 7:18 AM

## 2020-11-07 DIAGNOSIS — I82411 Acute embolism and thrombosis of right femoral vein: Secondary | ICD-10-CM | POA: Diagnosis not present

## 2020-11-07 DIAGNOSIS — C50919 Malignant neoplasm of unspecified site of unspecified female breast: Secondary | ICD-10-CM | POA: Diagnosis not present

## 2020-11-07 DIAGNOSIS — R627 Adult failure to thrive: Secondary | ICD-10-CM | POA: Diagnosis not present

## 2020-11-07 LAB — CBC
HCT: 27.6 % — ABNORMAL LOW (ref 36.0–46.0)
Hemoglobin: 8.4 g/dL — ABNORMAL LOW (ref 12.0–15.0)
MCH: 26.7 pg (ref 26.0–34.0)
MCHC: 30.4 g/dL (ref 30.0–36.0)
MCV: 87.6 fL (ref 80.0–100.0)
Platelets: 317 10*3/uL (ref 150–400)
RBC: 3.15 MIL/uL — ABNORMAL LOW (ref 3.87–5.11)
RDW: 18.1 % — ABNORMAL HIGH (ref 11.5–15.5)
WBC: 23.6 10*3/uL — ABNORMAL HIGH (ref 4.0–10.5)
nRBC: 0.1 % (ref 0.0–0.2)

## 2020-11-07 LAB — GLUCOSE, CAPILLARY
Glucose-Capillary: 92 mg/dL (ref 70–99)
Glucose-Capillary: 87 mg/dL (ref 70–99)
Glucose-Capillary: 122 mg/dL — ABNORMAL HIGH (ref 70–99)
Glucose-Capillary: 104 mg/dL — ABNORMAL HIGH (ref 70–99)
Glucose-Capillary: 85 mg/dL (ref 70–99)

## 2020-11-07 LAB — COMPREHENSIVE METABOLIC PANEL
ALT: 22 U/L (ref 0–44)
AST: 70 U/L — ABNORMAL HIGH (ref 15–41)
Albumin: 1.7 g/dL — ABNORMAL LOW (ref 3.5–5.0)
Alkaline Phosphatase: 333 U/L — ABNORMAL HIGH (ref 38–126)
Anion gap: 9 (ref 5–15)
BUN: 19 mg/dL (ref 6–20)
CO2: 30 mmol/L (ref 22–32)
Calcium: 8 mg/dL — ABNORMAL LOW (ref 8.9–10.3)
Chloride: 102 mmol/L (ref 98–111)
Creatinine, Ser: <0.3 mg/dL — ABNORMAL LOW (ref 0.44–1.00)
Glucose, Bld: 87 mg/dL (ref 70–99)
Potassium: 4 mmol/L (ref 3.5–5.1)
Sodium: 141 mmol/L (ref 135–145)
Total Bilirubin: 0.7 mg/dL (ref 0.3–1.2)
Total Protein: 5 g/dL — ABNORMAL LOW (ref 6.5–8.1)

## 2020-11-07 NOTE — Progress Notes (Signed)
Physical Therapy Treatment Patient Details Name: Jessica Willis MRN: [id number] DOB: 22-Dec-1965 Today's Date: 11/07/2020    History of Present Illness Pt is a 55 y.o. female presenting to hospital 3/15 with R leg paresthesias and discomfort (pt fell on R side after coming home from hospital and developed symptoms after fall).  Imaging (+) extensive acute DVT R LE extending from proximal R femoral vein through veins of the calf.  Pt admitted with FTT.  (+) COVID-19.  PMH includes anemia, invasive R breast CA, on chronic O2, chemotherapy, PNA, bone metastasis, R arm weakness, tachycardia, metastatic breast CA, VATS.    PT Comments    PT (and OT) order noted to have been accidentally discontinued and MD re-entered therapy orders; no significant change in pt's status noted; will plan to continue current plan of care.  Pt resting in bed upon PT arrival; agreeable and motivated to participate in PT session.  Pt tolerated LE ex's in bed fairly well with assist R>L LE as needed.  Mod assist semi-supine to sitting edge of bed (assist for trunk); unable to stand up to walker (from elevated bed height) with 1 assist but was able to perform lateral scoot towards L x3 trials with mod to max assist x1.  Pt then tolerated sitting LE ex's fairly well.  Min assist for R>L LE sit to semi-supine in bed.  Pt reporting pain on her bottom with sitting but improved with repositioning.  Will continue to focus on strengthening and progressive functional mobility.    Follow Up Recommendations  SNF     Equipment Recommendations  Rolling walker with 5" wheels;3in1 (PT);Wheelchair (measurements PT);Wheelchair cushion (measurements PT);Hospital bed;Other (comment) (hoyer lift)    Recommendations for Other Services       Precautions / Restrictions Precautions Precautions: Fall Precaution Comments: Metastatic breast CA with bone metastasis; L chest port Restrictions Weight Bearing Restrictions: No    Mobility   Bed Mobility Overal bed mobility: Needs Assistance Bed Mobility: Rolling;Supine to Sit;Sit to Supine Rolling: Min assist (logrolling L and R in bed)   Supine to sit: Mod assist;HOB elevated Sit to supine: Min assist (bed flat)   General bed mobility comments: mod assist for trunk semi-supine to sitting edge of bed; assist for R>L LE sit to semi-supine in bed    Transfers Overall transfer level: Needs assistance Equipment used: Rolling walker (2 wheeled);None Transfers: Sit to/from Stand;Lateral/Scoot Transfers Sit to Stand: Total assist;From elevated surface        Lateral/Scoot Transfers: Mod assist;Max assist;From elevated surface General transfer comment: pt unable to stand from elevated bed x2 trials with 1 assist and walker use (vc's for UE/LE placement required); mod to max assist lateral scoot towards L side (x3 trials) with L knee blocked with mod to max assist x1 (vc's for technique required)  Ambulation/Gait             General Gait Details: unable to stand to attempt   Stairs             Wheelchair Mobility    Modified Rankin (Stroke Patients Only)       Balance Overall balance assessment: Needs assistance Sitting-balance support: No upper extremity supported;Feet supported Sitting balance-Leahy Scale: Good Sitting balance - Comments: steady sitting reaching within BOS                                    Cognition Arousal/Alertness: Awake/alert Behavior During  Therapy: Flat affect Overall Cognitive Status: Within Functional Limits for tasks assessed                                 General Comments: Pt tends to avoid eye contact but improved a little today      Exercises Total Joint Exercises Ankle Circles/Pumps: AROM;Strengthening;Both;10 reps;Supine Quad Sets: AROM;Strengthening;Both;10 reps;Supine Short Arc Quad: AROM;Strengthening;Both;10 reps;Supine Heel Slides: AAROM;Strengthening;Both;10 reps;Supine Hip  ABduction/ADduction: AAROM;Strengthening;Both;10 reps;Supine Long Arc Quad: AROM;Strengthening;Both;Seated (10 reps x2 B LE's) General Exercises - Lower Extremity Hip Flexion/Marching: AROM;Strengthening;Both;Seated (10 reps x2 (limited AROM B d/t weakness noted))    General Comments        Pertinent Vitals/Pain Pain Assessment: 0-10 Pain Score: 4  Pain Location: while sitting on bottom Pain Descriptors / Indicators: Sore Pain Intervention(s): Limited activity within patient's tolerance;Monitored during session;Repositioned  Vitals (HR and O2 on 2 L via nasal cannula) stable and WFL throughout treatment session.    Home Living                      Prior Function            PT Goals (current goals can now be found in the care plan section) Acute Rehab PT Goals Patient Stated Goal: To return home PT Goal Formulation: With patient Time For Goal Achievement: 11/11/20 Potential to Achieve Goals: Fair Progress towards PT goals: Progressing toward goals    Frequency    Min 2X/week      PT Plan Current plan remains appropriate    Co-evaluation              AM-PAC PT "6 Clicks" Mobility   Outcome Measure  Help needed turning from your back to your side while in a flat bed without using bedrails?: A Little Help needed moving from lying on your back to sitting on the side of a flat bed without using bedrails?: A Lot Help needed moving to and from a bed to a chair (including a wheelchair)?: Total Help needed standing up from a chair using your arms (e.g., wheelchair or bedside chair)?: Total Help needed to walk in hospital room?: Total Help needed climbing 3-5 steps with a railing? : Total 6 Click Score: 9    End of Session Equipment Utilized During Treatment: Gait belt Activity Tolerance: Patient tolerated treatment well Patient left: in bed;with call bell/phone within reach;with bed alarm set;with nursing/sitter in room;Other (comment) (NT's present to  assist pt with clean-up and purewick placement) Nurse Communication: Mobility status;Precautions;Other (comment) (pt's need for clean-up and purewick replacement) PT Visit Diagnosis: Other abnormalities of gait and mobility (R26.89);Muscle weakness (generalized) (M62.81);History of falling (Z91.81);Difficulty in walking, not elsewhere classified (R26.2)     Time: 6948-5462 PT Time Calculation (min) (ACUTE ONLY): 53 min  Charges:  $Therapeutic Exercise: 23-37 mins $Therapeutic Activity: 23-37 mins                    Leitha Bleak, PT 11/07/20, 12:55 PM

## 2020-11-07 NOTE — Progress Notes (Signed)
Pt refused miralax. Pt had a loose BM in AM.Pt is passing flatus. NP on call notified.

## 2020-11-07 NOTE — Progress Notes (Signed)
OT Cancellation Note  Patient Details Name: Jessica Willis MRN: [id number] DOB: [birth year]   Cancelled Treatment:    Reason Eval/Treat Not Completed: Patient declined, no reason specified   OT and PT orders found to have been accidentally discontinued and MD re-entered therapy orders; no significant change in pt's status noted and will continue within pt's plan of care.  OTR attempted to engage pt in treatment session this date and pt politely declined.  She states that she worked with physical therapy earlier and is still fatigued from session.  Pt agreeable to OT returning at next opportunity.  Myrtie Hawk Chance Karam, OTR/L 11/07/20, 1:55 PM

## 2020-11-07 NOTE — Progress Notes (Signed)
PROGRESS NOTE    Jessica Willis  WER:[id number] DOB: [birth year] DOA: 10/25/2020 PCP: [facility]    Assessment & Plan:   Principal Problem:   Failure to thrive in adult Active Problems:   Breast mass, right   Metastatic breast cancer ([place])   Anemia, chronic disease   Iron deficiency anemia   Bone metastasis (HCC)   Port-A-Cath in place   Chemotherapy-induced nausea   Poor appetite   Weakness   Dehydration   COVID-19 virus infection   Protein-calorie malnutrition, severe   Hypernatremia   Acute deep vein thrombosis (DVT) of right femoral vein (HCC)   Pressure injury of skin  Failure to thrive: started to eat a little today. 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 Poor prognosis. Chemotherapy was initiated on 10/14/2020 but progressed after first-line palliative chemotherapy with taxol. Outpatient follow-up with ophthalmologist for evaluation of possible metastatic disease to extraocular muscles.  Anemia of chronic disease: H&H are trending up today. S/p 1 unit of pRBCs transfused   Leukocytosis: likely steroid induced   Asymptomatic COVID-19 infection: completed remdesivir   Chronic hypoxic respiratory failure: continue on supplemental oxygen, on 3L [place]   Severe protein calorie malnutrition: continue w/ nutritional supplements   Generalized weakness: PT/OT recs SNF. CM is still working on SNF placement   Stage II decubitus ulcers of the right buttocks: present on admission. Continue w/ wound care.   DVT prophylaxis: eliquis  Code Status: full  Family Communication: pt again refuses for me to call her family to give an update  Disposition Plan: d/c to SNF   Level of care: Med-Surg   Status is: Inpatient  Remains inpatient appropriate because:Unsafe d/c plan, waiting on SNF placement still    Dispo: The  patient is from: Home              Anticipated d/c is to: SNF              Patient currently is medically stable to d/c.   Difficult to place patient Yes        Consultants:   Oncology   Procedures:    Antimicrobials:    Subjective: Pt c/o malaise   Objective: Vitals:   11/06/20 1616 11/06/20 2015 11/07/20 0021 11/07/20 0345  BP: 120/62 118/62 112/65 118/73  Pulse: 99 (!) 109 97 99  Resp: (!) 23 16 18 16   Temp: 98.2 F (36.8 C) 97.7 F (36.5 C) 98 F (36.7 C) 98.4 F (36.9 C)  TempSrc:  Oral    SpO2: 97% 98% 100% 98%  Weight:      Height:        Intake/Output Summary (Last 24 hours) at 11/07/2020 0721 Last data filed at 11/06/2020 2100 Gross per 24 hour  Intake 240 ml  Output 750 ml  Net -510 ml   Filed Weights   10/26/20 0400 10/27/20 0400 10/29/20 0606  Weight: 68.6 kg 69.9 kg 69.4 kg    Examination:  General exam: Appears calm & comfortable  Respiratory system: decreased breath sounds b/l  Cardiovascular system: S1/S2+. No clicks or rubs  Gastrointestinal system: Abd is soft, NT, ND & hypoactive bowel sounds  Central nervous system: Alert and oriented. Moves all 4 extremities  Psychiatry: judgement and insight appear normal. Flat mood and affect     Data Reviewed: I have personally reviewed following labs  and imaging studies  CBC: Recent Labs  Lab 11/03/20 0623 11/04/20 0525 11/05/20 0615 11/06/20 0421 11/07/20 0417  WBC 19.3* 20.9* 22.6* 23.5* 23.6*  HGB 8.1* 8.6* 8.1* 8.0* 8.4*  HCT 27.0* 28.2* 27.3* 27.5* 27.6*  MCV 86.5 87.3 87.8 87.9 87.6  PLT 355 343 321 309 389   Basic Metabolic Panel: Recent Labs  Lab 11/03/20 0623 11/04/20 0525 11/05/20 0615 11/06/20 0421 11/07/20 0417  NA 141 143 142 139 141  K 4.1 4.1 4.2 4.1 4.0  CL 103 104 102 101 102  CO2 32 32 32 31 30  GLUCOSE 78 102* 83 83 87  BUN 23* 22* 24* 22* 19  CREATININE 0.36* 0.35* 0.35* <0.30* <0.30*  CALCIUM 8.1* 8.4* 8.3* 8.1* 8.0*   GFR: CrCl cannot be  calculated (This lab value cannot be used to calculate CrCl because it is not a number: <0.30). Liver Function Tests: Recent Labs  Lab 11/03/20 0623 11/04/20 0525 11/05/20 0615 11/06/20 0421 11/07/20 0417  AST 61* 66* 68* 66* 70*  ALT 17 20 21 23 22   ALKPHOS 306* 324* 314* 326* 333*  BILITOT 0.6 0.6 0.6 0.8 0.7  PROT 4.8* 5.0* 5.2* 5.0* 5.0*  ALBUMIN 1.8* 1.9* 1.8* 1.8* 1.7*   No results for input(s): LIPASE, AMYLASE in the last 168 hours. No results for input(s): AMMONIA in the last 168 hours. Coagulation Profile: No results for input(s): INR, PROTIME in the last 168 hours. Cardiac Enzymes: No results for input(s): CKTOTAL, CKMB, CKMBINDEX, TROPONINI in the last 168 hours. BNP (last 3 results) No results for input(s): PROBNP in the last 8760 hours. HbA1C: Recent Labs    11/05/20 0615  HGBA1C 5.9*   CBG: Recent Labs  Lab 11/06/20 1144 11/06/20 1617 11/06/20 2038 11/06/20 2109 11/07/20 0408  GLUCAP 79 83 61* 71 92   Lipid Profile: No results for input(s): CHOL, HDL, LDLCALC, TRIG, CHOLHDL, LDLDIRECT in the last 72 hours. Thyroid Function Tests: No results for input(s): TSH, T4TOTAL, FREET4, T3FREE, THYROIDAB in the last 72 hours. Anemia Panel: No results for input(s): VITAMINB12, FOLATE, FERRITIN, TIBC, IRON, RETICCTPCT in the last 72 hours. Sepsis Labs: No results for input(s): PROCALCITON, LATICACIDVEN in the last 168 hours.  No results found for this or any previous visit (from the past 240 hour(s)).       Radiology Studies: No results found.      Scheduled Meds: . apixaban  5 mg Oral BID  . vitamin C  500 mg Oral Daily  . calcium carbonate  1 tablet Oral Q breakfast  . Chlorhexidine Gluconate Cloth  6 each Topical Daily  . dronabinol  5 mg Oral BID AC  . feeding supplement  237 mL Oral QID  . insulin aspart  0-5 Units Subcutaneous QHS  . insulin aspart  0-9 Units Subcutaneous TID WC  . magnesium chloride  1 tablet Oral Daily  . metoprolol  tartrate  50 mg Oral BID  . polyethylene glycol  17 g Oral BID  . predniSONE  5 mg Oral Q breakfast  . sodium chloride flush  10-40 mL Intracatheter Q12H   Continuous Infusions:   LOS: 12 days    Time spent: 25 mins    Wyvonnia Dusky, MD [facility] Pager [phone]  If 7PM-7AM, please contact night-coverage 11/07/2020, 7:21 AM

## 2020-11-07 NOTE — Progress Notes (Addendum)
Dressing changed to right chest. No c/o pain or discomfort. Dressing CDI.

## 2020-11-08 DIAGNOSIS — I82411 Acute embolism and thrombosis of right femoral vein: Secondary | ICD-10-CM | POA: Diagnosis not present

## 2020-11-08 DIAGNOSIS — R627 Adult failure to thrive: Secondary | ICD-10-CM | POA: Diagnosis not present

## 2020-11-08 DIAGNOSIS — C50919 Malignant neoplasm of unspecified site of unspecified female breast: Secondary | ICD-10-CM | POA: Diagnosis not present

## 2020-11-08 LAB — CBC
HCT: 26.3 % — ABNORMAL LOW (ref 36.0–46.0)
Hemoglobin: 7.7 g/dL — ABNORMAL LOW (ref 12.0–15.0)
MCH: 26.1 pg (ref 26.0–34.0)
MCHC: 29.3 g/dL — ABNORMAL LOW (ref 30.0–36.0)
MCV: 89.2 fL (ref 80.0–100.0)
Platelets: 273 10*3/uL (ref 150–400)
RBC: 2.95 MIL/uL — ABNORMAL LOW (ref 3.87–5.11)
RDW: 18.3 % — ABNORMAL HIGH (ref 11.5–15.5)
WBC: 23.4 10*3/uL — ABNORMAL HIGH (ref 4.0–10.5)
nRBC: 0.1 % (ref 0.0–0.2)

## 2020-11-08 LAB — BASIC METABOLIC PANEL
Anion gap: 6 (ref 5–15)
BUN: 19 mg/dL (ref 6–20)
CO2: 32 mmol/L (ref 22–32)
Calcium: 7.9 mg/dL — ABNORMAL LOW (ref 8.9–10.3)
Chloride: 104 mmol/L (ref 98–111)
Creatinine, Ser: 0.3 mg/dL — ABNORMAL LOW (ref 0.44–1.00)
GFR, Estimated: >60 mL/min (ref >60)
Glucose, Bld: 93 mg/dL (ref 70–99)
Potassium: 3.9 mmol/L (ref 3.5–5.1)
Sodium: 142 mmol/L (ref 135–145)

## 2020-11-08 LAB — GLUCOSE, CAPILLARY
Glucose-Capillary: 90 mg/dL (ref 70–99)
Glucose-Capillary: 79 mg/dL (ref 70–99)
Glucose-Capillary: 93 mg/dL (ref 70–99)
Glucose-Capillary: 130 mg/dL — ABNORMAL HIGH (ref 70–99)
Glucose-Capillary: 105 mg/dL — ABNORMAL HIGH (ref 70–99)

## 2020-11-08 NOTE — Progress Notes (Signed)
Per Dr Jimmye Norman ok to give BP med if pt asymptomatic and MAP >65

## 2020-11-08 NOTE — Progress Notes (Addendum)
Nutrition Follow-up  DOCUMENTATION CODES:   Severe malnutrition in context of acute illness/injury  INTERVENTION:   -Continue Ensure Enlive po QID, each supplement provides 350 kcal and 20 grams of protein -Continue Magic cup TID with meals, each supplement provides 290 kcal and 9 grams of protein -Continue MVI with minerals daily -Hormel Shake TID with meals, each supplement provides 520 kcals and 22 grams protein -If pt amenable to feeding tube, recommend:  Initiate Osmolite 1.5 @ 25 ml/hr and increase by 10 ml every 8 hours to goal rate of 55 ml/hr.   45 ml Prosource TF BID.    170 ml free water flush every 4 hours  Tube feeding regimen provides 2060 kcal (100% of needs), 105 grams of protein, and 1006 ml of H2O. Total free water: 2026 ml daily  -If feedings are initiated, monitor Mg, K, and Phos daily and replete as needed as pt is at high refeeding risk   NUTRITION DIAGNOSIS:   Severe Malnutrition related to acute illness (HCAP, COVID 19) as evidenced by moderate fat depletion,moderate muscle depletion,severe muscle depletion,percent weight loss.  Ongoing  GOAL:   Patient will meet greater than or equal to 90% of their needs  Progressing   MONITOR:   PO intake,Supplement acceptance,Labs,Weight trends,Skin,I & O's  REASON FOR ASSESSMENT:   Consult Assessment of nutrition requirement/status  ASSESSMENT:   55 y.o. female with medical history significant for right fungating breast cancer with metastasis (stage IV) to the lungs on IV chemotherapy, failure to thrive, malignant pleural effusion, hemothorax and debility who presents to the emergency department for chief concerns of weakness and found to have COVID 19  Reviewed I/O's: -280 ml x 24 hours and -2.2 L since admission  UOP: 400 ml x 24 hours  Pt unavailable at time of visit. Attempted to speak with pt via call to hospital room phone, however, unable to reach.   Pt remains with erratic intake. Noted meal  completion 0-50%. Pt intermittently taking Ensure ENlive supplements, usually consuming about two per day. Per chart review, pt has been withdrawn and sometimes refuses medications and care. She has been very withdrawn lately.   Palliative care following for goals of care discussions; will discuss possibility of placing feeding tube to optimize nutrition status.   Medications reviewed and include calcium carbonate, marinol, magnesium chloride, miralax, and prednisone.   Labs reviewed: CBGS: 85-93 (inpatient orders for glycemic control are 0-5 units insulin aspart daily at bedside and 0-9 units insulin aspart TID with meals).   Diet Order:   Diet Order            DIET SOFT Room service appropriate? Yes; Fluid consistency: Thin  Diet effective now                 EDUCATION NEEDS:   Education needs have been addressed  Skin:  Skin Assessment: Skin Integrity Issues: Skin Integrity Issues:: Stage II,Other (Comment) Stage II: rt and lt buttocks Other: non pressure wound to breast (necrotic tumor)  Last BM:  11/07/20  Height:   Ht Readings from Last 1 Encounters:  10/25/20 5\' 10"  (1.778 m)    Weight:   Wt Readings from Last 1 Encounters:  10/29/20 69.4 kg    Ideal Body Weight:  68 kg  BMI:  Body mass index is 21.94 kg/m.  Estimated Nutritional Needs:   Kcal:  1900-2200kcal/day  Protein:  95-110g/day  Fluid:  2.0-2.3L/day    Loistine Chance, RD, LDN, [place] Registered Dietitian II Certified Diabetes Care  and Education Specialist Please refer to [facility] for RD and/or RD on-call/weekend/after hours pager

## 2020-11-08 NOTE — Progress Notes (Signed)
Occupational Therapy Treatment Patient Details Name: Jessica Willis MRN: [id number] DOB: 1966-03-29 Today's Date: 11/08/2020    History of present illness Pt is a 55 y.o. female presenting to hospital 3/15 with R leg paresthesias and discomfort (pt fell on R side after coming home from hospital and developed symptoms after fall).  Imaging (+) extensive acute DVT R LE extending from proximal R femoral vein through veins of the calf.  Pt admitted with FTT.  (+) COVID-19.  PMH includes anemia, invasive R breast CA, on chronic O2, chemotherapy, PNA, bone metastasis, R arm weakness, tachycardia, metastatic breast CA, VATS.   OT comments  Jessica Willis was seen for OT/PT co-treatment on this date. Upon arrival to room pt completing bed exercises with PT, agreeable for OT to join to progress OOB mobility. MIN A exit L side of bed, assist to square hips to EOB. Pt completed x3 STS MOD A x2 + RW c B knees blocked and chux pad under bottom. Pt tolerated side steps ~30ft c MIN A. Pt requires MIN A apply lotion to back. SETUP + SBA tooth brushing seated EOB. MOD A sit>sup. MAX A doff B socks at bed level. Pt making good progress toward goals. Pt continues to benefit from skilled OT services to maximize return to PLOF and minimize risk of future falls, injury, caregiver burden, and readmission. Will continue to follow POC. Discharge recommendation remains appropriate.    Follow Up Recommendations  SNF    Equipment Recommendations  None recommended by OT    Recommendations for Other Services      Precautions / Restrictions Precautions Precautions: Fall Precaution Comments: Metastatic breast CA with bone metastasis; L chest port Restrictions Weight Bearing Restrictions: No       Mobility Bed Mobility Overal bed mobility: Needs Assistance Bed Mobility: Supine to Sit;Sit to Supine     Supine to sit: Min assist;HOB elevated Sit to supine: Mod assist   General bed mobility comments: with extra  time and use of side rails pt able to come to sitting up with LE's partially off of bed but required assist to scoot forward (using pad/sheet under pt) d/t soreness on bottom.    Transfers Overall transfer level: Needs assistance Equipment used: Rolling walker (2 wheeled) Transfers: Sit to/from Stand Sit to Stand: Mod assist;+2 physical assistance;From elevated surface         General transfer comment: x3 STS trials from elevated bed; vc's for UE and LE placement with B knees blocked; use of chux pad under pt to assist with facilitating stand up to walker; assist to control descent sitting    Balance Overall balance assessment: Needs assistance Sitting-balance support: No upper extremity supported;Feet supported Sitting balance-Leahy Scale: Good Sitting balance - Comments: steady sitting reaching within BOS   Standing balance support: Bilateral upper extremity supported Standing balance-Leahy Scale: Fair Standing balance comment: steady static standing with B UE support on RW                           ADL either performed or assessed with clinical judgement   ADL Overall ADL's : Needs assistance/impaired                                       General ADL Comments: MIN A apply lotion to back. SETUP + SBA tooth brushing seated EOB. MAX A for LBD at  bed level.               Cognition Arousal/Alertness: Awake/alert Behavior During Therapy: Flat affect Overall Cognitive Status: Within Functional Limits for tasks assessed                                 General Comments: Improved eye contact today but still tends to avoid eye contact in general        Exercises Exercises: Other exercises Other Exercises Other Exercises: Pt educated re: OT role, DME recs, HEP, importance of mobility for functional strengthening Other Exercises: LBD, grooming, sup<>sit,sit<>stand, sitting/standing balance/tolerance, side steps EOB      General  Comments SpO2 high 80s on 1L [place] upon arrival, pt baseline on 3L [place], titrated up to 2L [place] and SpO2 low 90s    Pertinent Vitals/ Pain       Pain Assessment: Faces Faces Pain Scale: Hurts little more Pain Location: pt's "bottom" while sitting; also posterior L knee and R ankle sore Pain Descriptors / Indicators: Sore;Tender Pain Intervention(s): Limited activity within patient's tolerance;Monitored during session;Repositioned         Frequency  Min 1X/week        Progress Toward Goals  OT Goals(current goals can now be found in the care plan section)  Progress towards OT goals: Progressing toward goals  Acute Rehab OT Goals Patient Stated Goal: To return home OT Goal Formulation: With patient Time For Goal Achievement: 11/11/20 Potential to Achieve Goals: Good ADL Goals Pt Will Perform Grooming: with modified independence;sitting Pt Will Perform Lower Body Dressing: with min assist;sitting/lateral leans Pt Will Transfer to Toilet: with min assist;bedside commode;stand pivot transfer Additional ADL Goal #1: Pt will Independenly verbalize plan to implement x3 ECS  Plan Discharge plan remains appropriate;Frequency remains appropriate    Co-evaluation    PT/OT/SLP Co-Evaluation/Treatment: Yes Reason for Co-Treatment: To address functional/ADL transfers PT goals addressed during session: Mobility/safety with mobility;Balance OT goals addressed during session: ADL's and self-care      AM-PAC OT "6 Clicks" Daily Activity     Outcome Measure   Help from another person eating meals?: None Help from another person taking care of personal grooming?: A Little Help from another person toileting, which includes using toliet, bedpan, or urinal?: A Lot Help from another person bathing (including washing, rinsing, drying)?: A Lot Help from another person to put on and taking off regular upper body clothing?: A Little Help from another person to put on and taking off regular lower  body clothing?: A Little 6 Click Score: 17    End of Session Equipment Utilized During Treatment: Gait belt;Oxygen  OT Visit Diagnosis: Unsteadiness on feet (R26.81);Repeated falls (R29.6);Muscle weakness (generalized) (M62.81)   Activity Tolerance Patient tolerated treatment well   Patient Left in bed;with call bell/phone within reach;with bed alarm set   Nurse Communication          Time: 9326-7124 OT Time Calculation (min): 43 min  Charges: OT General Charges $OT Visit: 1 Visit OT Treatments $Self Care/Home Management : 8-22 mins  Dessie Coma, M.S. OTR/L  11/08/20, 3:58 PM  ascom [phone]

## 2020-11-08 NOTE — Progress Notes (Signed)
Physical Therapy Treatment Patient Details Name: Jessica Willis MRN: [id number] DOB: Oct 10, 1965 Today's Date: 11/08/2020    History of Present Illness Pt is a 55 y.o. female presenting to hospital 3/15 with R leg paresthesias and discomfort (pt fell on R side after coming home from hospital and developed symptoms after fall).  Imaging (+) extensive acute DVT R LE extending from proximal R femoral vein through veins of the calf.  Pt admitted with FTT.  (+) COVID-19.  PMH includes anemia, invasive R breast CA, on chronic O2, chemotherapy, PNA, bone metastasis, R arm weakness, tachycardia, metastatic breast CA, VATS.    PT Comments    Pt sleeping in bed upon PT arrival; woken to vc's and agreeable to therapy session.  Tolerated LE ex's in bed with assist as needed.  Performed PT/OT co-treatment for functional mobility.  Pt able to sit up towards edge of bed with close SBA but required assist to scoot hips/bottom forward (with use of bed sheet) d/t reports of soreness on her bottom.  Pt stood from elevated bed up to RW x3 trials with mod assist x2 (use of bed sheet under pt's bottom/hips to assist with standing).  1st trial standing pt able to take 5 steps in place with L LE (R knee blocked); 2nd trial standing pt able to take 5 steps in place with R LE (L knee blocked); and 3rd trial standing pt able to side step to the L along bed 3 feet with RW (with opposite knee blocked as needed when taking steps).  Increased difficulty noted advancing R LE compared to L LE.  Pt sitting edge of bed participating in OT session end of session.  Will continue to focus on strengthening and progressive functional mobility per pt tolerance.   Follow Up Recommendations  SNF     Equipment Recommendations  Rolling walker with 5" wheels;3in1 (PT);Wheelchair (measurements PT);Wheelchair cushion (measurements PT);Hospital bed;Other (comment) (hoyer lift)    Recommendations for Other Services       Precautions /  Restrictions Precautions Precautions: Fall Precaution Comments: Metastatic breast CA with bone metastasis; L chest port Restrictions Weight Bearing Restrictions: No    Mobility  Bed Mobility Overal bed mobility: Needs Assistance Bed Mobility: Supine to Sit     Supine to sit: Min assist;HOB elevated     General bed mobility comments: with extra time and use of side rails pt able to come to sitting up with LE's partially off of bed but required assist to scoot forward (using pad/sheet under pt) d/t soreness on bottom.    Transfers Overall transfer level: Needs assistance Equipment used: Rolling walker (2 wheeled) Transfers: Sit to/from Stand Sit to Stand: Mod assist;+2 physical assistance;From elevated surface         General transfer comment: x3 trials standing from bed; vc's for UE and LE placement with B knees blocked; use of bed pad under pt to assist with facilitating stand up to walker; assist to control descent sitting  Ambulation/Gait Ambulation/Gait assistance: Min assist;+2 physical assistance Gait Distance (Feet): 3 Feet (side stepping to L along bed) Assistive device: Rolling walker (2 wheeled)   Gait velocity: decreased   General Gait Details: opposite knee blocked when sidestepping with other LE; side stepped to L along bed with vc's for technique; more difficulty sidestepping with R LE compared to L LE   Stairs             Wheelchair Mobility    Modified Rankin (Stroke Patients Only)  Balance Overall balance assessment: Needs assistance Sitting-balance support: No upper extremity supported;Feet supported Sitting balance-Leahy Scale: Good Sitting balance - Comments: steady sitting reaching within BOS   Standing balance support: Bilateral upper extremity supported Standing balance-Leahy Scale: Fair Standing balance comment: steady static standing with B UE support on RW                            Cognition Arousal/Alertness:  Awake/alert Behavior During Therapy: Flat affect Overall Cognitive Status: Within Functional Limits for tasks assessed                                 General Comments: Improved eye contact today but still tends to avoid eye contact in general      Exercises Total Joint Exercises Ankle Circles/Pumps: AROM;Strengthening;Both;10 reps;Supine Quad Sets: AROM;Strengthening;Both;10 reps;Supine Gluteal Sets: AROM;Strengthening;Both;10 reps;Supine Short Arc Quad: AROM;Strengthening;Both;10 reps;Supine Heel Slides: AAROM;Strengthening;Both;10 reps;Supine Hip ABduction/ADduction: AAROM;Strengthening;Both;10 reps;Supine Straight Leg Raises: AAROM;Strengthening;Both;10 reps;Supine Marching in Standing: AROM;Strengthening;Both;5 reps;Standing;Other (comment) (B UE support on RW; opposite LE knee blocked: decreased B LE foot clearance)    General Comments   Nursing cleared pt for participation in physical therapy.  Pt agreeable to PT session.      Pertinent Vitals/Pain Pain Assessment: Faces Faces Pain Scale: Hurts little more Pain Location: pt's "bottom" while sitting; also posterior L knee and R ankle sore Pain Descriptors / Indicators: Sore;Tender Pain Intervention(s): Limited activity within patient's tolerance;Monitored during session;Repositioned  O2 sats 92% on 2 L via nasal cannula end of session.    Home Living                      Prior Function            PT Goals (current goals can now be found in the care plan section) Acute Rehab PT Goals Patient Stated Goal: To return home PT Goal Formulation: With patient Time For Goal Achievement: 11/11/20 Potential to Achieve Goals: Fair Progress towards PT goals: Progressing toward goals    Frequency    Min 2X/week      PT Plan Current plan remains appropriate    Co-evaluation PT/OT/SLP Co-Evaluation/Treatment: Yes Reason for Co-Treatment: For patient/therapist safety;To address functional/ADL  transfers PT goals addressed during session: Mobility/safety with mobility;Strengthening/ROM OT goals addressed during session: ADL's and self-care;Strengthening/ROM      AM-PAC PT "6 Clicks" Mobility   Outcome Measure  Help needed turning from your back to your side while in a flat bed without using bedrails?: A Little Help needed moving from lying on your back to sitting on the side of a flat bed without using bedrails?: A Lot Help needed moving to and from a bed to a chair (including a wheelchair)?: A Lot Help needed standing up from a chair using your arms (e.g., wheelchair or bedside chair)?: Total Help needed to walk in hospital room?: Total Help needed climbing 3-5 steps with a railing? : Total 6 Click Score: 10    End of Session Equipment Utilized During Treatment: Gait belt Activity Tolerance: Patient tolerated treatment well Patient left:  (sitting on edge of bed with OT present) Nurse Communication: Mobility status;Precautions;Other (comment) (pt's O2 increased from 1 L to 2 L d/t O2 sats 89% at rest and 84-85% with activity) PT Visit Diagnosis: Other abnormalities of gait and mobility (R26.89);Muscle weakness (generalized) (M62.81);History of falling (Z91.81);Difficulty in walking, not elsewhere  classified (R26.2)     Time: 6859-9234 PT Time Calculation (min) (ACUTE ONLY): 50 min  Charges:  $Gait Training: 8-22 mins $Therapeutic Exercise: 8-22 mins $Therapeutic Activity: 8-22 mins                     Leitha Bleak, PT 11/08/20, 3:56 PM

## 2020-11-08 NOTE — Progress Notes (Signed)
PROGRESS NOTE   HPI was taken from Dr. Tobie Poet: Jessica Willis is a 55 y.o. 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  She endorses weakness and frequent falling since discharge from the hospital.  She endorses poor p.o. intake since discharge from the hospital and she is not able to eat due to generalized mouth pain.  She denies fever, vomiting, chest pain, shortness of breath, cough, dysuria, hematuria, diarrhea.  She endorses generalized weakness and poor p.o. intake as her main concerns. She also endorses right lower extremity leg pain.   Social history: lives with friend.  She reports that her daughter is her healthcare power of attorney.  She does not have a history of tobacco use, she does not drink alcohol and prior to that infrequently consumed EtOH, no rec recreational drug use.              Vaccination: Patient is not vaccinated for COVID-19 and does not want to be vaccinated  Allergies: No known life-threatening drug allergies per patient            Hospital Course from Dr. Jimmye Norman 3/23-3/29/22: Pt has not made much improvement. Pt continues to have failure to thrive and not eating or drinking much. Pt wants to remain full code and refuses to let any staff contact her family. PT/OT recs SNF. CM is having difficulty placing pt in SNF. Pt has not been d/c secondary to unsafe d/c plan.      Jessica Willis  PFX:[id number] DOB: [birth year] DOA: 10/25/2020 PCP: [facility]    Assessment & Plan:   Principal Problem:   Failure to thrive in adult Active Problems:   Breast mass, right   Metastatic breast cancer ([place])   Anemia, chronic disease   Iron deficiency anemia   Bone metastasis ([facility])   Port-A-Cath in place    Chemotherapy-induced nausea   Poor appetite   Weakness   Dehydration   COVID-19 virus infection   Protein-calorie malnutrition, severe   Hypernatremia   Acute deep vein thrombosis (DVT) of right femoral vein ([facility])   Pressure injury of skin  Failure to thrive: still eating/drinking very little. 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 Poor prognosis. Chemotherapy was initiated on 10/14/2020 but progressed after first-line palliative chemotherapy with taxol. Outpatient follow-up with ophthalmologist for evaluation of possible metastatic disease to extraocular muscles.  Anemia of chronic disease: H&H are trending up today. S/p 1 unit of pRBCs transfused   Leukocytosis: likely steroid induced  Asymptomatic COVID-19 infection: completed remdesivir   Chronic hypoxic respiratory failure: continue on supplemental oxygen, 3L [place]   Severe protein calorie malnutrition: continue w/ nutritional supplements   Generalized weakness: therapy recs SNF. CM is still working on SNF placement  Stage II decubitus ulcers of the right buttocks: present on admission. Continue w/ wound care.   DVT prophylaxis: eliquis  Code Status: full  Family Communication: pt again refuses for me to call her family to give an update  Disposition Plan: d/c to SNF   Level of care: Med-Surg   Status is: Inpatient  Remains inpatient appropriate because:Unsafe d/c plan, waiting on SNF placement still    Dispo: The patient is  from: Home              Anticipated d/c is to: SNF              Patient currently is medically stable to d/c.   Difficult to place patient Yes    Consultants:   Oncology   Procedures:    Antimicrobials:    Subjective: Pt c/o fatigue  Objective: Vitals:   11/07/20 1641 11/07/20 2057 11/08/20 0054 11/08/20 0402  BP: 117/62 111/65 113/65 (!) 98/58   Pulse: (!) 103 (!) 110 (!) 109 94  Resp: 16 20 16 16   Temp: 97.9 F (36.6 C) 98.7 F (37.1 C) 98.2 F (36.8 C) 98.4 F (36.9 C)  TempSrc:  Oral Oral Axillary  SpO2: 99% 100% 100% 100%  Weight:      Height:        Intake/Output Summary (Last 24 hours) at 11/08/2020 0727 Last data filed at 11/07/2020 2100 Gross per 24 hour  Intake 120 ml  Output 400 ml  Net -280 ml   Filed Weights   10/26/20 0400 10/27/20 0400 10/29/20 0606  Weight: 68.6 kg 69.9 kg 69.4 kg    Examination:  General exam: Appears comfortable but depressed  Respiratory system: diminished breath sounds b/l Cardiovascular system: S1 & S2+. No rubs or clicks   Gastrointestinal system: Abd is soft, NT, ND & hypoactive bowel sounds  Central nervous system: Alert and oriented. Moves all 4 extremities  Psychiatry: judgement and insight appear normal. Flat mood and affect     Data Reviewed: I have personally reviewed following labs and imaging studies  CBC: Recent Labs  Lab 11/04/20 0525 11/05/20 0615 11/06/20 0421 11/07/20 0417 11/08/20 0428  WBC 20.9* 22.6* 23.5* 23.6* 23.4*  HGB 8.6* 8.1* 8.0* 8.4* 7.7*  HCT 28.2* 27.3* 27.5* 27.6* 26.3*  MCV 87.3 87.8 87.9 87.6 89.2  PLT 343 321 309 317 122   Basic Metabolic Panel: Recent Labs  Lab 11/04/20 0525 11/05/20 0615 11/06/20 0421 11/07/20 0417 11/08/20 0428  NA 143 142 139 141 142  K 4.1 4.2 4.1 4.0 3.9  CL 104 102 101 102 104  CO2 32 32 31 30 32  GLUCOSE 102* 83 83 87 93  BUN 22* 24* 22* 19 19  CREATININE 0.35* 0.35* <0.30* <0.30* 0.30*  CALCIUM 8.4* 8.3* 8.1* 8.0* 7.9*   GFR: Estimated Creatinine Clearance: 84.9 mL/min (A) (by C-G formula based on SCr of 0.3 mg/dL (L)). Liver Function Tests: Recent Labs  Lab 11/03/20 0623 11/04/20 0525 11/05/20 0615 11/06/20 0421 11/07/20 0417  AST 61* 66* 68* 66* 70*  ALT 17 20 21 23 22   ALKPHOS 306* 324* 314* 326* 333*  BILITOT 0.6 0.6 0.6 0.8 0.7  PROT 4.8* 5.0* 5.2* 5.0* 5.0*  ALBUMIN 1.8*  1.9* 1.8* 1.8* 1.7*   No results for input(s): LIPASE, AMYLASE in the last 168 hours. No results for input(s): AMMONIA in the last 168 hours. Coagulation Profile: No results for input(s): INR, PROTIME in the last 168 hours. Cardiac Enzymes: No results for input(s): CKTOTAL, CKMB, CKMBINDEX, TROPONINI in the last 168 hours. BNP (last 3 results) No results for input(s): PROBNP in the last 8760 hours. HbA1C: No results for input(s): HGBA1C in the last 72 hours. CBG: Recent Labs  Lab 11/07/20 0818 11/07/20 1127 11/07/20 1643 11/07/20 2125 11/08/20 0407  GLUCAP 87 122* 104* 85 90   Lipid Profile: No results for input(s): CHOL, HDL, LDLCALC, TRIG, CHOLHDL, LDLDIRECT in the last 72 hours. Thyroid Function  Tests: No results for input(s): TSH, T4TOTAL, FREET4, T3FREE, THYROIDAB in the last 72 hours. Anemia Panel: No results for input(s): VITAMINB12, FOLATE, FERRITIN, TIBC, IRON, RETICCTPCT in the last 72 hours. Sepsis Labs: No results for input(s): PROCALCITON, LATICACIDVEN in the last 168 hours.  No results found for this or any previous visit (from the past 240 hour(s)).       Radiology Studies: No results found.      Scheduled Meds: . apixaban  5 mg Oral BID  . vitamin C  500 mg Oral Daily  . calcium carbonate  1 tablet Oral Q breakfast  . Chlorhexidine Gluconate Cloth  6 each Topical Daily  . dronabinol  5 mg Oral BID AC  . feeding supplement  237 mL Oral QID  . insulin aspart  0-5 Units Subcutaneous QHS  . insulin aspart  0-9 Units Subcutaneous TID WC  . magnesium chloride  1 tablet Oral Daily  . metoprolol tartrate  50 mg Oral BID  . polyethylene glycol  17 g Oral BID  . predniSONE  5 mg Oral Q breakfast  . sodium chloride flush  10-40 mL Intracatheter Q12H   Continuous Infusions:   LOS: 13 days    Time spent: 28 mins    Wyvonnia Dusky, MD [facility] Pager [phone]  If 7PM-7AM, please contact night-coverage 11/08/2020, 7:27 AM

## 2020-11-09 DIAGNOSIS — R627 Adult failure to thrive: Secondary | ICD-10-CM | POA: Diagnosis not present

## 2020-11-09 DIAGNOSIS — C50919 Malignant neoplasm of unspecified site of unspecified female breast: Secondary | ICD-10-CM | POA: Diagnosis not present

## 2020-11-09 DIAGNOSIS — L89302 Pressure ulcer of unspecified buttock, stage 2: Secondary | ICD-10-CM

## 2020-11-09 DIAGNOSIS — U071 COVID-19: Secondary | ICD-10-CM | POA: Diagnosis not present

## 2020-11-09 DIAGNOSIS — J9601 Acute respiratory failure with hypoxia: Secondary | ICD-10-CM | POA: Diagnosis not present

## 2020-11-09 DIAGNOSIS — E43 Unspecified severe protein-calorie malnutrition: Secondary | ICD-10-CM | POA: Diagnosis not present

## 2020-11-09 DIAGNOSIS — I82411 Acute embolism and thrombosis of right femoral vein: Secondary | ICD-10-CM | POA: Diagnosis not present

## 2020-11-09 DIAGNOSIS — R630 Anorexia: Secondary | ICD-10-CM | POA: Diagnosis not present

## 2020-11-09 DIAGNOSIS — R531 Weakness: Secondary | ICD-10-CM

## 2020-11-09 DIAGNOSIS — J9611 Chronic respiratory failure with hypoxia: Secondary | ICD-10-CM

## 2020-11-09 LAB — BASIC METABOLIC PANEL
Anion gap: 8 (ref 5–15)
BUN: 22 mg/dL — ABNORMAL HIGH (ref 6–20)
CO2: 30 mmol/L (ref 22–32)
Calcium: 8.1 mg/dL — ABNORMAL LOW (ref 8.9–10.3)
Chloride: 104 mmol/L (ref 98–111)
Creatinine, Ser: 0.3 mg/dL — ABNORMAL LOW (ref 0.44–1.00)
GFR, Estimated: >60 mL/min (ref >60)
Glucose, Bld: 95 mg/dL (ref 70–99)
Potassium: 4 mmol/L (ref 3.5–5.1)
Sodium: 142 mmol/L (ref 135–145)

## 2020-11-09 LAB — CBC
HCT: 28.5 % — ABNORMAL LOW (ref 36.0–46.0)
Hemoglobin: 8.4 g/dL — ABNORMAL LOW (ref 12.0–15.0)
MCH: 25.8 pg — ABNORMAL LOW (ref 26.0–34.0)
MCHC: 29.5 g/dL — ABNORMAL LOW (ref 30.0–36.0)
MCV: 87.4 fL (ref 80.0–100.0)
Platelets: 304 10*3/uL (ref 150–400)
RBC: 3.26 MIL/uL — ABNORMAL LOW (ref 3.87–5.11)
RDW: 18.3 % — ABNORMAL HIGH (ref 11.5–15.5)
WBC: 25.1 10*3/uL — ABNORMAL HIGH (ref 4.0–10.5)
nRBC: 0.1 % (ref 0.0–0.2)

## 2020-11-09 LAB — GLUCOSE, CAPILLARY
Glucose-Capillary: 88 mg/dL (ref 70–99)
Glucose-Capillary: 92 mg/dL (ref 70–99)
Glucose-Capillary: 93 mg/dL (ref 70–99)
Glucose-Capillary: 122 mg/dL — ABNORMAL HIGH (ref 70–99)
Glucose-Capillary: 114 mg/dL — ABNORMAL HIGH (ref 70–99)
Glucose-Capillary: 141 mg/dL — ABNORMAL HIGH (ref 70–99)

## 2020-11-09 MED ORDER — BIOTENE DRY MOUTH MT LIQD: 15.0000 mL | OROMUCOSAL | Status: DC | PRN

## 2020-11-09 NOTE — Progress Notes (Signed)
.   Hematology/Oncology Progress Note [facility] Telephone:([phone] [phone]  Patient Care Team: [place] as PCP - General Rico Junker, RN as Registered Nurse Benjamine Sprague, DO as Consulting Physician (Surgery)   Name of the patient: Jessica Willis  [id number]  10-27-1965  Date of visit: 11/09/20   INTERVAL HISTORY-  Patient was seen at the bedside.  No acute overnight issues. Appetite is poor and she feels weak.    Review of systems- Review of Systems  Constitutional: Positive for appetite change and fatigue. Negative for chills and fever.  HENT:   Negative for hearing loss and voice change.   Eyes: Negative for eye problems.  Respiratory: Positive for shortness of breath. Negative for chest tightness and cough.   Cardiovascular: Negative for chest pain.  Gastrointestinal: Negative for abdominal distention, abdominal pain and blood in stool.  Endocrine: Negative for hot flashes.  Genitourinary: Negative for difficulty urinating and frequency.   Musculoskeletal: Negative for arthralgias.  Skin: Negative for itching and rash.  Neurological: Negative for extremity weakness.  Hematological: Negative for adenopathy.  Psychiatric/Behavioral: Negative for confusion.    No Known Allergies  Patient Active Problem List   Diagnosis Date Noted  . Pressure injury of skin 10/27/2020  . Protein-calorie malnutrition, severe 10/26/2020  . Hypernatremia 10/26/2020  . Acute deep vein thrombosis (DVT) of right femoral vein ([place]) 10/26/2020  . Weakness 10/25/2020  . Failure to thrive in adult 10/25/2020  . Dehydration 10/25/2020  . COVID-19 virus infection 10/25/2020  . Chemotherapy-induced nausea   . Poor appetite   . Pneumonia due to infectious organism 10/07/2020  . QT prolongation 09/22/2020  . Pneumonia due to Streptococcus pyogenes ([place]) 08/22/2020  . Genetic testing 07/27/2020  . Pancreatic lesion  07/22/2020  . Swelling of lower leg 07/13/2020  . Encounter for antineoplastic chemotherapy 07/04/2020  . Complication of chemotherapy 07/04/2020  . Family history of breast cancer   . Port-A-Cath in place   . Bone metastasis ([place])   . Hypocalcemia   . Folate deficiency   . Gastroesophageal reflux disease without esophagitis   . Weakness of right arm   . Palliative care encounter   . Iron deficiency anemia   . Anemia, chronic disease   . Tachycardia   . Metastatic breast cancer ([place])   . Malignant pleural effusion   . Chest tube in place   . Goals of care, counseling/discussion   . Hemothorax on right 06/16/2020  . Acute respiratory failure with hypoxia ([place])   . Breast mass, right 06/15/2020  . Mastitis 06/15/2020  . Pleural effusion 06/15/2020  . Intestinal obstruction ([place])   . Leukocytosis   . Hypokalemia 06/24/2015  . Sepsis ([place]) 06/23/2015  . CAP (community acquired pneumonia) 06/23/2015  . Partial small bowel obstruction ([place]) 06/23/2015     Past Medical History:  Diagnosis Date  . Anemia   . Family history of breast cancer   . Patient denies medical problems      Past Surgical History:  Procedure Laterality Date  . BREAST BIOPSY  06/28/2020   Procedure: BREAST BIOPSY;  Surgeon: Benjamine Sprague, DO;  Location: [facility] ORS;  Service: General;;  . PORTACATH PLACEMENT N/A 06/28/2020   Procedure: INSERTION PORT-A-CATH;  Surgeon: Benjamine Sprague, DO;  Location: [facility] ORS;  Service: General;  Laterality: N/A;  . TUBAL LIGATION    . VIDEO ASSISTED THORACOSCOPY (VATS)/THOROCOTOMY Right 06/23/2020   Procedure: ATTEMPTED VIDEO ASSISTED THORACOSCOPY (VATS);  Surgeon: Nestor Lewandowsky, MD;  Location: [facility] ORS;  Service: General;  Laterality: Right;    Social History   Socioeconomic History  . Marital status: Single    Spouse name: Not on file  . Number of children: Not on file  . Years of education: Not on file  . Highest education level: Not on file  Occupational History   . Not on file  Tobacco Use  . Smoking status: Never Smoker  . Smokeless tobacco: Never Used  Vaping Use  . Vaping Use: Never used  Substance and Sexual Activity  . Alcohol use: Not Currently    Alcohol/week: 1.0 standard drink    Types: 1 Cans of beer per week  . Drug use: No  . Sexual activity: Not on file  Other Topics Concern  . Not on file  Social History Narrative  . Not on file   Social Determinants of Health   Financial Resource Strain: Not on file  Food Insecurity: Not on file  Transportation Needs: Not on file  Physical Activity: Not on file  Stress: Not on file  Social Connections: Not on file  Intimate Partner Violence: Not on file     Family History  Problem Relation Age of Onset  . Diabetes Other   . Hypertension Other   . Diabetes Maternal Aunt   . Breast cancer Cousin        dx 46s  . Breast cancer Cousin        dx 62s     Current Facility-Administered Medications:  .  antiseptic oral rinse (BIOTENE) solution 15 mL, 15 mL, Mouth Rinse, PRN, Borders, Kirt Boys, NP .  apixaban (ELIQUIS) tablet 5 mg, 5 mg, Oral, BID, Wyvonnia Dusky, MD, 5 mg at 11/09/20 6270 .  ascorbic acid (VITAMIN C) tablet 500 mg, 500 mg, Oral, Daily, Wyvonnia Dusky, MD, 500 mg at 11/09/20 3500 .  calcium carbonate (OS-CAL - dosed in mg of elemental calcium) tablet 500 mg of elemental calcium, 1 tablet, Oral, Q breakfast, Eppie Gibson M, MD, 500 mg of elemental calcium at 11/09/20 0954 .  Chlorhexidine Gluconate Cloth 2 % PADS 6 each, 6 each, Topical, Daily, Wyvonnia Dusky, MD, 6 each at 11/08/20 0945 .  dronabinol (MARINOL) capsule 5 mg, 5 mg, Oral, BID AC, Wyvonnia Dusky, MD, 5 mg at 11/09/20 1214 .  feeding supplement (ENSURE ENLIVE / ENSURE PLUS) liquid 237 mL, 237 mL, Oral, QID, Wyvonnia Dusky, MD, 237 mL at 11/09/20 0956 .  insulin aspart (novoLOG) injection 0-5 Units, 0-5 Units, Subcutaneous, QHS, Williams, Jamiese M, MD .  insulin aspart (novoLOG)  injection 0-9 Units, 0-9 Units, Subcutaneous, TID WC, Wyvonnia Dusky, MD, 1 Units at 11/09/20 1224 .  magic mouthwash, 5 mL, Oral, TID PRN, Wyvonnia Dusky, MD .  magnesium chloride (SLOW-MAG) 64 MG SR tablet 64 mg, 1 tablet, Oral, Daily, Wyvonnia Dusky, MD, 64 mg at 11/09/20 0954 .  metoprolol tartrate (LOPRESSOR) tablet 50 mg, 50 mg, Oral, BID, Wyvonnia Dusky, MD, 50 mg at 11/09/20 9381 .  morphine 4 MG/ML injection 4 mg, 4 mg, Intravenous, Q3H PRN, Wyvonnia Dusky, MD .  oxyCODONE-acetaminophen (PERCOCET) 7.5-325 MG per tablet 1 tablet, 1 tablet, Oral, Q8H PRN, Wyvonnia Dusky, MD .  polyethylene glycol (MIRALAX / GLYCOLAX) packet 17 g, 17 g, Oral, BID, Wyvonnia Dusky, MD, 17 g at 11/05/20 8299 .  predniSONE (DELTASONE) tablet 5 mg, 5 mg, Oral, Q breakfast, Earlie Server, MD, 5 mg at 11/09/20 1001 .  sodium chloride flush (NS) 0.9 % injection 10 mL, 10 mL, Intravenous, PRN, Jennye Boroughs, MD, 10 mL at 11/07/20 0909 .  sodium chloride flush (NS) 0.9 % injection 10-40 mL, 10-40 mL, Intracatheter, Q12H, Earlie Server, MD, 10 mL at 11/09/20 1230 .  sodium chloride flush (NS) 0.9 % injection 10-40 mL, 10-40 mL, Intracatheter, PRN, Earlie Server, MD   Physical exam:  Vitals:   11/09/20 0339 11/09/20 0740 11/09/20 1209 11/09/20 1534  BP: 113/70 (!) 115/49 111/63 115/67  Pulse: (!) 107 (!) 110 99 (!) 103  Resp: 20 16  17   Temp: (!) 97.5 F (36.4 C) 97.6 F (36.4 C) 97.9 F (36.6 C) 97.9 F (36.6 C)  TempSrc:  Oral Oral   SpO2: 98% 99% 100% 99%  Weight:      Height:       Physical Exam Constitutional:      General: She is not in acute distress.    Appearance: She is not diaphoretic.     Comments: Cachectic  HENT:     Head: Normocephalic and atraumatic.     Nose: Nose normal.     Mouth/Throat:     Pharynx: No oropharyngeal exudate.  Eyes:     General: No scleral icterus.    Pupils: Pupils are equal, round, and reactive to light.  Cardiovascular:     Rate and  Rhythm: Normal rate and regular rhythm.     Heart sounds: No murmur heard.   Pulmonary:     Effort: Pulmonary effort is normal. No respiratory distress.     Breath sounds: No rales.     Comments: Decreased breath sound bilaterally. On nasal cannula oxygen. Chest:     Chest wall: No tenderness.  Abdominal:     General: There is no distension.     Palpations: Abdomen is soft.     Tenderness: There is no abdominal tenderness.  Musculoskeletal:        General: Normal range of motion.     Cervical back: Normal range of motion and neck supple.  Skin:    General: Skin is warm and dry.     Findings: No erythema.  Neurological:     Mental Status: She is alert and oriented to person, place, and time.     Cranial Nerves: No cranial nerve deficit.     Motor: No abnormal muscle tone.     Coordination: Coordination normal.  Psychiatric:        Mood and Affect: Affect normal.   Right breast mass with chronic drainage covered with dressing Multiple small masses on right shoulder   CMP Latest Ref Rng & Units 11/09/2020  Glucose 70 - 99 mg/dL 95  BUN 6 - 20 mg/dL 22(H)  Creatinine 0.44 - 1.00 mg/dL 0.30(L)  Sodium 135 - 145 mmol/L 142  Potassium 3.5 - 5.1 mmol/L 4.0  Chloride 98 - 111 mmol/L 104  CO2 22 - 32 mmol/L 30  Calcium 8.9 - 10.3 mg/dL 8.1(L)  Total Protein 6.5 - 8.1 g/dL -  Total Bilirubin 0.3 - 1.2 mg/dL -  Alkaline Phos 38 - 126 U/L -  AST 15 - 41 U/L -  ALT 0 - 44 U/L -   CBC Latest Ref Rng & Units 11/09/2020  WBC 4.0 - 10.5 K/uL 25.1(H)  Hemoglobin 12.0 - 15.0 g/dL 8.4(L)  Hematocrit 36.0 - 46.0 % 28.5(L)  Platelets 150 - 400 K/uL 304    RADIOGRAPHIC STUDIES: I have personally reviewed the radiological images as listed and agreed  with the findings in the report. DG Lumbar Spine Complete  Result Date: 10/25/2020 CLINICAL DATA:  Pain following fall EXAM: LUMBAR SPINE - COMPLETE 4+ VIEW COMPARISON:  None. FINDINGS: Frontal, lateral, spot lumbosacral lateral, and  bilateral oblique views were obtained. There are 5 non-rib-bearing lumbar type vertebral bodies. There is no fracture or spondylolisthesis. The disc spaces appear normal. There is no appreciable facet arthropathy. IMPRESSION: No fracture or spondylolisthesis.  No evident arthropathy. Electronically Signed   By: Lowella Grip III M.D.   On: 10/25/2020 14:08   MR BRAIN W WO CONTRAST  Addendum Date: 10/26/2020   ADDENDUM REPORT: 10/26/2020 13:14 ADDENDUM: The conclusion mentions that breast cancer is thought unlikely for the orbital findings. On further review, given the patient's widespread metastatic disease and given that breast cancer does have a propensity for orbital metastasis (occasionally multifocal/bilateral), metastatic breast cancer is not excluded. The additional differential considerations mentioned in the conclusion remain possibilities, particularly orbital lymphoma given the restricted diffusion. Consider ophthalmology consultation. Findings discussed with Dr. Jennye Boroughs via telephone at 1:10 pm. Electronically Signed   By: Margaretha Sheffield MD   On: 10/26/2020 13:14   Result Date: 10/26/2020 CLINICAL DATA:  Neuro deficit, acute stroke suspected. Right leg paresthesias and discomfort. EXAM: MRI HEAD WITHOUT AND WITH CONTRAST TECHNIQUE: Multiplanar, multiecho pulse sequences of the brain and surrounding structures were obtained without and with intravenous contrast. CONTRAST:  34mL GADAVIST GADOBUTROL 1 MMOL/ML IV SOLN COMPARISON:  MRI November 12 21. FINDINGS: Brain: No acute infarction, hemorrhage, hydrocephalus, extra-axial collection or intracranial mass lesion. No abnormal intracranial enhancement. Vascular: Major arterial flow voids are maintained at the skull base. Skull and upper cervical spine: Increased size/conspicuity of scattered T1 hypointense metastasis in the upper cervical spine. Additionally, increased size of the clival skull metastasis, now measuring 1.4 cm. T1  hypointensity of the right occipital calvarium appears similar to prior and is concerning for osseous metastatic disease. Sinuses/Orbits: Interval development of marked enlargement of multiple extraocular muscles, including bulky enlargement of the left medial and lateral rectus muscles and right lateral rectus muscle. Additionally, there is evidence of involvement of the right medial rectus muscle anteriorly (series 17, image 26 and series 5, image 19), superior rectus musculature anteriorly bilaterally (series 17, 26; series 5, image 22) and the inferior rectus muscle on the right posteriorly (series 17, image 24; series 5, image 16). The enlarged extra-ocular muscles demonstrate T2 hypointensity and restricted diffusion, suggesting high cellularity. There is involvement of the muscle bodies as well as tendinous insertions in areas. Other: No sizable mastoid effusions. IMPRESSION: 1. No evidence of acute infarct or intracranial brain metastasis. 2. Interval development of marked enlargement of multiple bilateral extraocular muscles, detailed above and most prominently involving the medial and lateral rectus. The enlarged extra-ocular muscles demonstrate T2 hypointensity and restricted diffusion, suggesting high cellularity. While nonspecific, this finding can be seen with orbital lymphoma. Idiopathic orbital inflammatory pseudotumor and IgG4-related orbital disease are additional differential considerations. Metastatic breast cancer was considered given the clinical history, but thought unlikely given the diffuse nature of the process. 3. Partially imaged metastases in the upper cervical spine and clival metastasis appear larger and more conspicuous, concerning for progression of bony metastatic disease. Similar T1 hypointensity right occipital calvarium, concerning for an additional site of osseous metastatic disease. Findings discussed with Dr. Jaclynn Guarneri at 5:52 p.m. via telephone. Electronically Signed: By:  Margaretha Sheffield MD On: 10/25/2020 18:17   US Venous Img Lower Unilateral Right (DVT)  Result Date: 10/25/2020  CLINICAL DATA:  Initial evaluation for acute right lower extremity pain and swelling. EXAM: RIGHT LOWER EXTREMITY VENOUS DOPPLER ULTRASOUND TECHNIQUE: Gray-scale sonography with graded compression, as well as color Doppler and duplex ultrasound were performed to evaluate the lower extremity deep venous systems from the level of the common femoral vein and including the common femoral, femoral, profunda femoral, popliteal and calf veins including the posterior tibial, peroneal and gastrocnemius veins when visible. The superficial great saphenous vein was also interrogated. Spectral Doppler was utilized to evaluate flow at rest and with distal augmentation maneuvers in the common femoral, femoral and popliteal veins. COMPARISON:  None. FINDINGS: Contralateral Common Femoral Vein: Respiratory phasicity is normal and symmetric with the symptomatic side. No evidence of thrombus. Normal compressibility. Common Femoral Vein: No evidence of thrombus. Normal compressibility, respiratory phasicity and response to augmentation. Saphenofemoral Junction: Echogenic thrombus seen at the saphenofemoral junction. Profunda Femoral Vein: No evidence of thrombus. Normal compressibility and flow on color Doppler imaging. Femoral Vein: Echogenic partially occlusive thrombus seen throughout the proximal, mid, and distal right femoral vein. Popliteal Vein: Echogenic occlusive thrombus seen throughout the right popliteal vein. Calf Veins: Echogenic occlusive thrombus seen within the right peroneal and posterior tibial veins. Superficial Great Saphenous Vein: No evidence of thrombus. Normal compressibility. Venous Reflux:  None. Other Findings:  None. IMPRESSION: Positive study with extensive acute DVT extending from the proximal right femoral vein through the veins of the calf. Electronically Signed   By: Jeannine Boga  M.D.   On: 10/25/2020 18:40   DG Chest Port 1 View  Result Date: 10/25/2020 CLINICAL DATA:  Chest pain. EXAM: PORTABLE CHEST 1 VIEW COMPARISON:  Single-view of the chest 10/15/2020 and 10/13/2020. CT chest 10/07/2020. FINDINGS: Right pleural effusion and opacities projecting over the right chest consistent with known chest wall and intrathoracic metastatic breast carcinoma again seen. Scattered opacities in the left chest are also again seen. Aeration has improved bilaterally since the most recent exam. Small right pleural effusion is noted. The left lung is clear. Heart size is normal. IMPRESSION: Aeration in both lungs appears improved compared to the most recent study in this patient with extensive metastatic breast cancer. No new abnormality. Electronically Signed   By: Inge Rise M.D.   On: 10/25/2020 12:19   DG Chest Port 1 View  Result Date: 10/15/2020 CLINICAL DATA:  Pneumonia and pleural effusion. Metastatic breast cancer. EXAM: PORTABLE CHEST 1 VIEW COMPARISON:  10/13/2020 chest radiograph and prior studies FINDINGS: Cardiomediastinal silhouette is unchanged with RIGHT mediastinal fullness. A LEFT subclavian Port-A-Cath with tip overlying the UPPER RIGHT atrium again noted. Scattered opacities within both lungs are again noted with a RIGHT pleural effusion. There is no evidence of pneumothorax. No significant changes are identified. IMPRESSION: Unchanged appearance of the chest with scattered opacities within both lungs and RIGHT pleural effusion. Electronically Signed   By: Margarette Canada M.D.   On: 10/15/2020 11:06   DG Chest Port 1 View  Result Date: 10/13/2020 CLINICAL DATA:  Pleural effusion EXAM: PORTABLE CHEST 1 VIEW COMPARISON:  10/07/2020 FINDINGS: Unchanged positioning of left-sided Port-A-Cath. Stable cardiomediastinal contours. Persistent small right pleural effusion. Slight progression of airspace opacity throughout the right lung most pronounced within the right lung base.  Similar interstitial opacities throughout the left lung. No pneumothorax. IMPRESSION: 1. Slight progression of airspace opacity throughout the right lung, most pronounced within the right lung base. 2. Persistent small right pleural effusion. Electronically Signed   By: Davina Poke D.O.   On: 10/13/2020  14:10   DG HIP UNILAT W OR W/O PELVIS 2-3 VIEWS RIGHT  Result Date: 10/25/2020 CLINICAL DATA:  History of metastatic breast cancer. Patient status post fall. Initial encounter. EXAM: DG HIP (WITH OR WITHOUT PELVIS) 2-3V RIGHT COMPARISON:  PET CT scan 07/19/2020. FINDINGS: No acute bony or joint abnormality is identified. Scattered sclerotic lesions are present in both hips and the pelvis consistent with known metastatic breast cancer. Soft tissues are negative. IMPRESSION: No acute abnormality. Scattered sclerotic lesions consistent with known metastatic breast cancer. Electronically Signed   By: Inge Rise M.D.   On: 10/25/2020 14:09    Assessment and plan-  #Acute right lower extremity proximal DVT  Thrombosis probably due to immobilization secondary to recent hospitalization, widely metastatic cancer, COVID-19 infection etc. continue Eliquis.  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 10/14/2020, status post salvage second line palliative chemotherapy with Adriamycin.  She is currently in visceral crisis.  Prognosis is poor.   Discussed with patient again today. CODE STATUS DNR/DNI. I am very concerned that she is getting weaker and nutritional status further worsen. I discussed with her that she is most likely not going to be strong enough to receive additional chemotherapy. Patient is undecided about comfort care/hospice at this point.  She wants more time to consider. Palliative care is following.  #Failure to thrive, severe protein calorie malnutrition. Continue nutrition supplementation. Taper down to prednisone 5 mg daily.    #Severe protein calorie  malnutrition.    Marinol 5 mg twice daily..  Nutrition supplementation. #Chronic respiratory failure, continue albuterol nebulizer and inhaler.  Nasal cannula oxygen.  Wean as tolerated. #Right breast wound,decubitus ulcers of the right buttocks -wound care #?metastatic disease to extraocular muscles -poor prognosis. #Covid infection, finished remdesivir.  Okay from heme-onc aspect to be discharged or proceed with hospice  thank you for allowing me to participate in the care of this patient.   Earlie Server, MD, PhD Hematology Oncology [facility] Pager- [phone] 11/09/2020

## 2020-11-09 NOTE — Progress Notes (Signed)
Patient ID: Jessica Willis, female   DOB: 1966-08-08, 55 y.o.   MRN: [id number] [facility] PROGRESS NOTE  Ronelle Michie WUJ:[id number] DOB: June 27, 1966 DOA: 10/25/2020 PCP: [place]  HPI/Subjective: Patient with poor eye contact.  Complains of dry mouth.  Not eating very much.  Currently no shortness of breath.  No chest pain.  Patient agreeable to DO NOT RESUSCITATE with palliative care today.  Patient admitted on 315 with malnutrition and failure to thrive and had a fall.  Objective: Vitals:   11/09/20 1209 11/09/20 1534  BP: 111/63 115/67  Pulse: 99 (!) 103  Resp:  17  Temp: 97.9 F (36.6 C) 97.9 F (36.6 C)  SpO2: 100% 99%    Intake/Output Summary (Last 24 hours) at 11/09/2020 1633 Last data filed at 11/09/2020 0413 Gross per 24 hour  Intake 0 ml  Output 350 ml  Net -350 ml   Filed Weights   10/26/20 0400 10/27/20 0400 10/29/20 0606  Weight: 68.6 kg 69.9 kg 69.4 kg    ROS: Review of Systems  Respiratory: Negative for shortness of breath.   Cardiovascular: Negative for chest pain.  Gastrointestinal: Negative for abdominal pain, nausea and vomiting.   Exam: Physical Exam HENT:     Head: Normocephalic.     Mouth/Throat:     Pharynx: No oropharyngeal exudate.  Eyes:     General: Lids are normal.     Conjunctiva/sclera: Conjunctivae normal.  Cardiovascular:     Rate and Rhythm: Normal rate and regular rhythm.     Heart sounds: Normal heart sounds, S1 normal and S2 normal.  Pulmonary:     Breath sounds: Normal breath sounds. No decreased breath sounds, wheezing, rhonchi or rales.  Abdominal:     Palpations: Abdomen is soft.     Tenderness: There is no abdominal tenderness.  Musculoskeletal:     Right ankle: Swelling present.     Left ankle: Swelling present.  Skin:    General: Skin is warm.     Findings: No rash.  Neurological:     Mental Status: She is alert and oriented to person, place, and time.       Data  Reviewed: Basic Metabolic Panel: Recent Labs  Lab 11/05/20 0615 11/06/20 0421 11/07/20 0417 11/08/20 0428 11/09/20 0500  NA 142 139 141 142 142  K 4.2 4.1 4.0 3.9 4.0  CL 102 101 102 104 104  CO2 32 31 30 32 30  GLUCOSE 83 83 87 93 95  BUN 24* 22* 19 19 22*  CREATININE 0.35* <0.30* <0.30* 0.30* 0.30*  CALCIUM 8.3* 8.1* 8.0* 7.9* 8.1*   Liver Function Tests: Recent Labs  Lab 11/03/20 0623 11/04/20 0525 11/05/20 0615 11/06/20 0421 11/07/20 0417  AST 61* 66* 68* 66* 70*  ALT 17 20 21 23 22   ALKPHOS 306* 324* 314* 326* 333*  BILITOT 0.6 0.6 0.6 0.8 0.7  PROT 4.8* 5.0* 5.2* 5.0* 5.0*  ALBUMIN 1.8* 1.9* 1.8* 1.8* 1.7*    CBC: Recent Labs  Lab 11/05/20 0615 11/06/20 0421 11/07/20 0417 11/08/20 0428 11/09/20 0500  WBC 22.6* 23.5* 23.6* 23.4* 25.1*  HGB 8.1* 8.0* 8.4* 7.7* 8.4*  HCT 27.3* 27.5* 27.6* 26.3* 28.5*  MCV 87.8 87.9 87.6 89.2 87.4  PLT 321 309 317 273 304   BNP (last 3 results) Recent Labs    06/15/20 2125 10/09/20 0518  BNP 49.6 180.5*    CBG: Recent Labs  Lab 11/08/20 1601 11/08/20 2014 11/09/20 0340 11/09/20 0740 11/09/20 1219  GLUCAP  130* 105* 92 93 122*    No results found for this or any previous visit (from the past 240 hour(s)).   Studies: No results found.  Scheduled Meds: . apixaban  5 mg Oral BID  . vitamin C  500 mg Oral Daily  . calcium carbonate  1 tablet Oral Q breakfast  . Chlorhexidine Gluconate Cloth  6 each Topical Daily  . dronabinol  5 mg Oral BID AC  . feeding supplement  237 mL Oral QID  . insulin aspart  0-5 Units Subcutaneous QHS  . insulin aspart  0-9 Units Subcutaneous TID WC  . magnesium chloride  1 tablet Oral Daily  . metoprolol tartrate  50 mg Oral BID  . polyethylene glycol  17 g Oral BID  . predniSONE  5 mg Oral Q breakfast  . sodium chloride flush  10-40 mL Intracatheter Q12H    Assessment/Plan:  1. Failure to thrive.  Patient still not eating and drinking very much.  Agreeable to DO NOT  RESUSCITATE.  Still thinking about next steps of treatment. 2. Right femoral DVT on Eliquis 3. 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  Her last PET scan patient also had a hypermetabolic lesion in the pancreatic tail.  Also has metastases seen in the orbits.  Palliative care following oncology following.  Patient would be a candidate for hospice home if she is agreeable.  Patient likely not a candidate for further cancer treatments at this point. 4. COVID-19 infection completed remdesivir 5. Anemia of chronic disease patient transfused a unit of packed red blood cells during the hospital course 6. Leukocytosis likely steroid-induced 7. Chronic respiratory failure on 3 L of oxygen 8. Severe protein calorie malnutrition 9. Generalized weakness.  Physical therapy recommending rehab.  She will be able to get out to rehab on 11/17/2019 if patient wants to go that route. 10. Stage II decubiti on the right and left buttocks.  Present on admission.  See full description below.  Pressure Injury 10/25/20 Buttocks Left;Right Stage 2 -  Partial thickness loss of dermis presenting as a shallow open injury with a red, pink wound bed without slough. (Active)  10/25/20 2100  Location: Buttocks  Location Orientation: Left;Right  Staging: Stage 2 -  Partial thickness loss of dermis presenting as a shallow open injury with a red, pink wound bed without slough.  Wound Description (Comments):   Present on Admission: Yes       Code Status:     Code Status Orders  (From admission, onward)         Start     Ordered   11/09/20 1323  Do not attempt resuscitation (DNR)  Continuous       Question Answer Comment  In the event of cardiac or respiratory ARREST Do not call a "code blue"   In the event of cardiac or respiratory ARREST Do not perform Intubation, CPR, defibrillation or ACLS   In the event of  cardiac or respiratory ARREST Use medication by any route, position, wound care, and other measures to relive pain and suffering. May use oxygen, suction and manual treatment of airway obstruction as needed for comfort.      11/09/20 1322        Code Status History    Date Active Date Inactive Code Status Order ID Comments User Context   10/25/2020 1530 11/09/2020 1322 Full Code [id number]  Cox, Amy Delane Ginger,  DO ED   10/13/2020 1828 10/22/2020 0035 DNR [id number]  Earlie Server, MD Inpatient   10/07/2020 1552 10/13/2020 1828 Full Code [id number]  Ivor Costa, MD Inpatient   06/16/2020 0010 07/01/2020 1818 Full Code [id number]  Elwyn Reach, MD ED   06/23/2015 2341 06/30/2015 1944 Full Code [id number]  Lance Coon, MD Inpatient   Advance Care Planning Activity    Advance Directive Documentation   Flowsheet Row Most Recent Value  Type of Advance Directive Healthcare Power of Attorney, Living will  Pre-existing out of facility DNR order (yellow form or pink MOST form) --  "MOST" Form in Place? --     Family Communication: Refused.  I specifically asked her if I can call family and she said no.  I mentioned to the patient that I did call family on previous admissions and she still declined me calling family today. Disposition Plan: Status is: Inpatient  Dispo: The patient is from: Home              Anticipated d/c is to: Rehab versus hospice home depending on patient choice              Patient currently being watched closely in the hospital.  Still has to be out of the 21-day isolation for Covid infection in order to go out to rehab.  Patient offered by palliative hospice facility.  Patient will think about things.   Difficult to place patient.  Yes.  Time spent: 28 minutes  [place]

## 2020-11-09 NOTE — Evaluation (Signed)
Occupational Therapy Re-evaluation Patient Details Name: Jessica Willis MRN: [id number] DOB: 1966/03/25 Today's Date: 11/09/2020    History of Present Illness Pt is a 55 y.o. female presenting to hospital 3/15 with R leg paresthesias and discomfort (pt fell on R side after coming home from hospital and developed symptoms after fall).  Imaging (+) extensive acute DVT R LE extending from proximal R femoral vein through veins of the calf.  Pt admitted with FTT.  (+) COVID-19.  PMH includes anemia, invasive R breast CA, on chronic O2, chemotherapy, PNA, bone metastasis, R arm weakness, tachycardia, metastatic breast CA, VATS.   Clinical Impression   Pt seen for OT re-evaluation on this date in setting on prolonged hospital stay. Upon arrival to room, pt awake and seated upright in bed. Pt agreeable to OT session. Pt currently requires MIN-MOD A for bed mobility, SUPERVISION/SET-UP for UB dressing, MIN A for seated UB bathing, MOD A for LB bathing, and MAX A for bed-level toilet hygiene. This date, pt unable to stand up (with RW and bed elevated) with 1 assist, however, pt is making good progress towards goals and has met 2/3 goals (with pt steadily progressing towards third goal); goals updated based on pt's progress. Pt continues to benefit from skilled OT services to maximize return to PLOF and minimize risk of future falls, injury, caregiver burden, and readmission. Discharge recommendation remains appropriate.     Follow Up Recommendations  SNF    Equipment Recommendations  None recommended by OT       Precautions / Restrictions Precautions Precautions: Fall Precaution Comments: Metastatic breast CA with bone metastasis; L chest port Restrictions Weight Bearing Restrictions: No      Mobility Bed Mobility Overal bed mobility: Needs Assistance Bed Mobility: Supine to Sit;Sit to Supine;Rolling Rolling: Min assist   Supine to sit: Min assist;HOB elevated Sit to supine: Mod assist    General bed mobility comments: Required extra time and use of side rail. Pt able to come to sitting up with LE's partially off of bed but required assist to scoot forward (using pad/sheet under pt) d/t soreness on bottom.    Transfers                General transfer comment: Pt unable to stand up (with RW and bed elevated) with 1 assist    Balance Overall balance assessment: Needs assistance Sitting-balance support: No upper extremity supported;Feet supported Sitting balance-Leahy Scale: Good Sitting balance - Comments: steady sitting reaching within BOS to bathe at EOB                                   ADL either performed or assessed with clinical judgement   ADL Overall ADL's : Needs assistance/impaired         Upper Body Bathing: Minimal assistance;Sitting Upper Body Bathing Details (indicate cue type and reason): MIN A for thoroughness Lower Body Bathing: Moderate assistance;Sit to/from stand Lower Body Bathing Details (indicate cue type and reason): MOD A to bathe posterior aspects of legs Upper Body Dressing : Supervision/safety;Set up;Sitting Upper Body Dressing Details (indicate cue type and reason): To don/doff hospital gown         Toileting- Clothing Manipulation and Hygiene: Maximal assistance;Bed level               Vision Patient Visual Report: No change from baseline  Pertinent Vitals/Pain Pain Assessment: Faces Faces Pain Scale: Hurts little more Pain Location: pt's "bottom" while sitting Pain Descriptors / Indicators: Sore;Tender Pain Intervention(s): Limited activity within patient's tolerance;Monitored during session;Repositioned     Hand Dominance     Extremity/Trunk Assessment Upper Extremity Assessment Upper Extremity Assessment: Generalized weakness   Lower Extremity Assessment Lower Extremity Assessment: Generalized weakness       Communication     Cognition Arousal/Alertness:  Awake/alert Behavior During Therapy: Flat affect Overall Cognitive Status: Within Functional Limits for tasks assessed                                 General Comments: Pt tends to avoid eye contact.                          OT Problem List: Decreased strength;Decreased activity tolerance;Impaired balance (sitting and/or standing);Decreased safety awareness      OT Treatment/Interventions: Self-care/ADL training;Therapeutic exercise;Energy conservation;DME and/or AE instruction;Therapeutic activities;Patient/family education;Balance training    OT Goals(Current goals can be found in the care plan section) Acute Rehab OT Goals Patient Stated Goal: To return home OT Goal Formulation: With patient Time For Goal Achievement: 11/23/20 Potential to Achieve Goals: Good ADL Goals Pt Will Perform Grooming: with min assist;standing Pt Will Perform Lower Body Dressing: with modified independence;sitting/lateral leans  OT Frequency: Min 1X/week   Barriers to D/C: Decreased caregiver support             AM-PAC OT "6 Clicks" Daily Activity     Outcome Measure Help from another person eating meals?: None Help from another person taking care of personal grooming?: A Little Help from another person toileting, which includes using toliet, bedpan, or urinal?: A Lot Help from another person bathing (including washing, rinsing, drying)?: A Lot Help from another person to put on and taking off regular upper body clothing?: A Little Help from another person to put on and taking off regular lower body clothing?: A Little 6 Click Score: 17   End of Session Equipment Utilized During Treatment: Gait belt;Oxygen;Rolling walker Nurse Communication: Mobility status  Activity Tolerance: Patient tolerated treatment well Patient left: in bed;with call bell/phone within reach;with bed alarm set  OT Visit Diagnosis: Unsteadiness on feet (R26.81);Repeated falls (R29.6);Muscle  weakness (generalized) (M62.81)                Time: 1025-1110 OT Time Calculation (min): 45 min Charges:  OT General Charges $OT Visit: 1 Visit OT Evaluation $OT Re-eval: 1 Re-eval OT Treatments $Self Care/Home Management : 23-37 mins  Fredirick Maudlin, OTR/L [place]

## 2020-11-09 NOTE — TOC Progression Note (Signed)
Transition of Care [facility]) - Progression Note    Patient Details  Name: Jessica Willis MRN: [id number] Date of Birth: 28-Jan-1966  Transition of Care [facility]) CM/SW Contact  Shelbie Hutching, RN Phone Number: 11/09/2020, 9:23 AM  Clinical Narrative:    [facility] team continues to look for SNF placement.  Bed search expanded.  No word back from [place].    Expected Discharge Plan: [place] Barriers to Discharge: Continued Medical Work up  Expected Discharge Plan and Services Expected Discharge Plan: [place] In-house Referral: Hospice / Palliative Care Discharge Planning Services: CM Consult Post Acute Care Choice: [place] Living arrangements for the past 2 months: Single Family Home                 DME Arranged: N/A DME Agency: NA       HH Arranged: NA           Social Determinants of Health (SDOH) Interventions    Readmission Risk Interventions Readmission Risk Prevention Plan 11/01/2020 10/19/2020  Transportation Screening Complete Complete  Medication Review [employer] photographer) Complete Complete  PCP or Specialist appointment within 3-5 days of discharge Complete Complete  HRI or Home Care Consult Complete Complete  SW Recovery Care/Counseling Consult Complete Complete  Palliative Care Screening Complete -  [place] Complete Not Applicable  Some recent data might be hidden

## 2020-11-09 NOTE — Progress Notes (Addendum)
[place]  Telephone:([phone] [phone]   Name: Jessica Willis Date: 11/09/2020 MRN: [id number]  DOB: 10-Sep-1965  Patient Care Team: [place] as PCP - Lester Kinsman, Linna Darner, RN as Registered Nurse Benjamine Sprague, DO as Consulting Physician (Surgery)    REASON FOR CONSULTATION: Jessica Willis is a 55 y.o. 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  CT of the chest revealed evidence of disease progression with worse adenopathy with enlarging necrotic breast mass.  Patient was rotated to single agent Adriamycin after verbalizing goal of continuing systemic chemotherapy.  Her hospitalization was complicated by hypoxic respiratory failure ultimately requiring discharge home on oxygen.  Patient is now readmitted 10/25/2020 with weakness and failure to thrive after falling at home.  Palliative care was consulted help address goals.   CODE STATUS: DNR  PAST MEDICAL HISTORY: Past Medical History:  Diagnosis Date  . Anemia   . Family history of breast cancer   . Patient denies medical problems     PAST SURGICAL HISTORY:  Past Surgical History:  Procedure Laterality Date  . BREAST BIOPSY  06/28/2020   Procedure: BREAST BIOPSY;  Surgeon: Benjamine Sprague, DO;  Location: [facility] ORS;  Service: General;;  . PORTACATH PLACEMENT N/A 06/28/2020   Procedure: INSERTION PORT-A-CATH;  Surgeon: Benjamine Sprague, DO;  Location: [facility] ORS;  Service: General;  Laterality: N/A;  . TUBAL LIGATION    . VIDEO ASSISTED THORACOSCOPY (VATS)/THOROCOTOMY Right 06/23/2020   Procedure: ATTEMPTED VIDEO ASSISTED THORACOSCOPY (VATS);  Surgeon: Nestor Lewandowsky, MD;  Location: [facility] ORS;  Service: General;  Laterality: Right;    HEMATOLOGY/ONCOLOGY HISTORY:   Oncology History  Metastatic breast cancer ([place])  06/21/2020 Initial Diagnosis   Metastatic breast cancer ([place])   06/28/2020 Cancer Staging   Staging form: Breast, AJCC 8th Edition - Clinical stage from 06/28/2020: Stage IV (cT4c, cM1, G3, ER-, PR-, HER2-) - Signed by Earlie Server, MD on 08/11/2020   06/29/2020 - 09/22/2020 Chemotherapy          Genetic Testing   Negative genetic testing. No pathogenic variants identified on the [facility] Common Hereditary Cancers Panel. The report date is 07/25/2020.  The Common Hereditary Cancers Panel offered by [facility] includes sequencing and/or deletion duplication testing of the following 48 genes: APC, ATM, AXIN2, BARD1, BMPR1A, BRCA1, BRCA2, BRIP1, CDH1, CDKN2A (p14ARF), CDKN2A (p16INK4a), CKD4, CHEK2, CTNNA1, DICER1, EPCAM (Deletion/duplication testing only), GREM1 (promoter region deletion/duplication testing only), KIT, MEN1, MLH1, MSH2, MSH3, MSH6, MUTYH, NBN, NF1, NHTL1, PALB2, PDGFRA, PMS2, POLD1, POLE, PTEN, RAD50, RAD51C, RAD51D, RNF43, SDHB, SDHC, SDHD, SMAD4, SMARCA4. STK11, TP53, TSC1, TSC2, and VHL.  The following genes were evaluated for sequence changes only: SDHA and HOXB13 c.251G>A variant only.   10/14/2020 -  Chemotherapy    Patient is on Treatment Plan: BREAST DOXORUBICIN Q21D        ALLERGIES:  has No Known Allergies.  MEDICATIONS:  Current Facility-Administered Medications  Medication Dose Route Frequency Provider Last Rate Last Admin  . apixaban (ELIQUIS) tablet 5 mg  5 mg Oral BID Wyvonnia Dusky, MD   5 mg at 11/09/20 0413  . ascorbic acid (VITAMIN C) tablet 500 mg  500 mg Oral Daily Wyvonnia Dusky, MD   500 mg at 11/09/20 6438  . calcium carbonate (OS-CAL - dosed in mg of elemental calcium) tablet 500 mg of  elemental calcium  1 tablet Oral Q breakfast Wyvonnia Dusky, MD   500 mg of elemental calcium at 11/09/20 0954  . Chlorhexidine Gluconate Cloth 2 % PADS 6 each  6 each Topical Daily Wyvonnia Dusky, MD    6 each at 11/08/20 0945  . dronabinol (MARINOL) capsule 5 mg  5 mg Oral BID AC Wyvonnia Dusky, MD   5 mg at 11/09/20 1214  . feeding supplement (ENSURE ENLIVE / ENSURE PLUS) liquid 237 mL  237 mL Oral QID Wyvonnia Dusky, MD   237 mL at 11/09/20 0956  . insulin aspart (novoLOG) injection 0-5 Units  0-5 Units Subcutaneous QHS Wyvonnia Dusky, MD      . insulin aspart (novoLOG) injection 0-9 Units  0-9 Units Subcutaneous TID WC Wyvonnia Dusky, MD   1 Units at 11/09/20 1224  . magic mouthwash  5 mL Oral TID PRN Wyvonnia Dusky, MD      . magnesium chloride (SLOW-MAG) 64 MG SR tablet 64 mg  1 tablet Oral Daily Wyvonnia Dusky, MD   64 mg at 11/09/20 0954  . metoprolol tartrate (LOPRESSOR) tablet 50 mg  50 mg Oral BID Wyvonnia Dusky, MD   50 mg at 11/09/20 3474  . morphine 4 MG/ML injection 4 mg  4 mg Intravenous Q3H PRN Wyvonnia Dusky, MD      . oxyCODONE-acetaminophen (PERCOCET) 7.5-325 MG per tablet 1 tablet  1 tablet Oral Q8H PRN Wyvonnia Dusky, MD      . polyethylene glycol (MIRALAX / GLYCOLAX) packet 17 g  17 g Oral BID Wyvonnia Dusky, MD   17 g at 11/05/20 2595  . predniSONE (DELTASONE) tablet 5 mg  5 mg Oral Q breakfast Earlie Server, MD   5 mg at 11/09/20 1001  . sodium chloride flush (NS) 0.9 % injection 10 mL  10 mL Intravenous PRN Jennye Boroughs, MD   10 mL at 11/07/20 0909  . sodium chloride flush (NS) 0.9 % injection 10-40 mL  10-40 mL Intracatheter Q12H Earlie Server, MD   10 mL at 11/09/20 1230  . sodium chloride flush (NS) 0.9 % injection 10-40 mL  10-40 mL Intracatheter PRN Earlie Server, MD        VITAL SIGNS: BP 111/63 (BP Location: Right Arm)   Pulse 99   Temp 97.9 F (36.6 C) (Oral)   Resp 16   Ht 5' 10"  (1.778 m)   Wt 152 lb 14.4 oz (69.4 kg)   LMP 06/16/2015 Comment: Tubal Ligation   SpO2 100%   BMI 21.94 kg/m  Filed Weights   10/26/20 0400 10/27/20 0400 10/29/20 0606  Weight: 151 lb 3.8 oz (68.6 kg) 154 lb 1.6 oz (69.9 kg) 152 lb 14.4  oz (69.4 kg)    Estimated body mass index is 21.94 kg/m as calculated from the following:   Height as of this encounter: 5' 10"  (1.778 m).   Weight as of this encounter: 152 lb 14.4 oz (69.4 kg).  LABS: CBC:    Component Value Date/Time   WBC 25.1 (H) 11/09/2020 0500   HGB 8.4 (L) 11/09/2020 0500   HCT 28.5 (L) 11/09/2020 0500   PLT 304 11/09/2020 0500   MCV 87.4 11/09/2020 0500   NEUTROABS 4.6 10/30/2020 0421   LYMPHSABS 0.9 10/30/2020 0421   MONOABS 1.2 (H) 10/30/2020 0421   EOSABS 0.0 10/30/2020 0421   BASOSABS 0.1 10/30/2020 0421   Comprehensive Metabolic Panel:    Component Value Date/Time  NA 142 11/09/2020 0500   K 4.0 11/09/2020 0500   CL 104 11/09/2020 0500   CO2 30 11/09/2020 0500   BUN 22 (H) 11/09/2020 0500   CREATININE 0.30 (L) 11/09/2020 0500   GLUCOSE 95 11/09/2020 0500   CALCIUM 8.1 (L) 11/09/2020 0500   AST 70 (H) 11/07/2020 0417   ALT 22 11/07/2020 0417   ALKPHOS 333 (H) 11/07/2020 0417   BILITOT 0.7 11/07/2020 0417   PROT 5.0 (L) 11/07/2020 0417   ALBUMIN 1.7 (L) 11/07/2020 0417    RADIOGRAPHIC STUDIES: DG Lumbar Spine Complete  Result Date: 10/25/2020 CLINICAL DATA:  Pain following fall EXAM: LUMBAR SPINE - COMPLETE 4+ VIEW COMPARISON:  None. FINDINGS: Frontal, lateral, spot lumbosacral lateral, and bilateral oblique views were obtained. There are 5 non-rib-bearing lumbar type vertebral bodies. There is no fracture or spondylolisthesis. The disc spaces appear normal. There is no appreciable facet arthropathy. IMPRESSION: No fracture or spondylolisthesis.  No evident arthropathy. Electronically Signed   By: Lowella Grip III M.D.   On: 10/25/2020 14:08   MR BRAIN W WO CONTRAST  Addendum Date: 10/26/2020   ADDENDUM REPORT: 10/26/2020 13:14 ADDENDUM: The conclusion mentions that breast cancer is thought unlikely for the orbital findings. On further review, given the patient's widespread metastatic disease and given that breast cancer does have a  propensity for orbital metastasis (occasionally multifocal/bilateral), metastatic breast cancer is not excluded. The additional differential considerations mentioned in the conclusion remain possibilities, particularly orbital lymphoma given the restricted diffusion. Consider ophthalmology consultation. Findings discussed with Dr. Jennye Boroughs via telephone at 1:10 pm. Electronically Signed   By: Margaretha Sheffield MD   On: 10/26/2020 13:14   Result Date: 10/26/2020 CLINICAL DATA:  Neuro deficit, acute stroke suspected. Right leg paresthesias and discomfort. EXAM: MRI HEAD WITHOUT AND WITH CONTRAST TECHNIQUE: Multiplanar, multiecho pulse sequences of the brain and surrounding structures were obtained without and with intravenous contrast. CONTRAST:  34m GADAVIST GADOBUTROL 1 MMOL/ML IV SOLN COMPARISON:  MRI November 12 21. FINDINGS: Brain: No acute infarction, hemorrhage, hydrocephalus, extra-axial collection or intracranial mass lesion. No abnormal intracranial enhancement. Vascular: Major arterial flow voids are maintained at the skull base. Skull and upper cervical spine: Increased size/conspicuity of scattered T1 hypointense metastasis in the upper cervical spine. Additionally, increased size of the clival skull metastasis, now measuring 1.4 cm. T1 hypointensity of the right occipital calvarium appears similar to prior and is concerning for osseous metastatic disease. Sinuses/Orbits: Interval development of marked enlargement of multiple extraocular muscles, including bulky enlargement of the left medial and lateral rectus muscles and right lateral rectus muscle. Additionally, there is evidence of involvement of the right medial rectus muscle anteriorly (series 17, image 26 and series 5, image 19), superior rectus musculature anteriorly bilaterally (series 17, 26; series 5, image 22) and the inferior rectus muscle on the right posteriorly (series 17, image 24; series 5, image 16). The enlarged extra-ocular  muscles demonstrate T2 hypointensity and restricted diffusion, suggesting high cellularity. There is involvement of the muscle bodies as well as tendinous insertions in areas. Other: No sizable mastoid effusions. IMPRESSION: 1. No evidence of acute infarct or intracranial brain metastasis. 2. Interval development of marked enlargement of multiple bilateral extraocular muscles, detailed above and most prominently involving the medial and lateral rectus. The enlarged extra-ocular muscles demonstrate T2 hypointensity and restricted diffusion, suggesting high cellularity. While nonspecific, this finding can be seen with orbital lymphoma. Idiopathic orbital inflammatory pseudotumor and IgG4-related orbital disease are additional differential considerations. Metastatic breast cancer was  considered given the clinical history, but thought unlikely given the diffuse nature of the process. 3. Partially imaged metastases in the upper cervical spine and clival metastasis appear larger and more conspicuous, concerning for progression of bony metastatic disease. Similar T1 hypointensity right occipital calvarium, concerning for an additional site of osseous metastatic disease. Findings discussed with Dr. Jaclynn Guarneri at 5:52 p.m. via telephone. Electronically Signed: By: Margaretha Sheffield MD On: 10/25/2020 18:17   US Venous Img Lower Unilateral Right (DVT)  Result Date: 10/25/2020 CLINICAL DATA:  Initial evaluation for acute right lower extremity pain and swelling. EXAM: RIGHT LOWER EXTREMITY VENOUS DOPPLER ULTRASOUND TECHNIQUE: Gray-scale sonography with graded compression, as well as color Doppler and duplex ultrasound were performed to evaluate the lower extremity deep venous systems from the level of the common femoral vein and including the common femoral, femoral, profunda femoral, popliteal and calf veins including the posterior tibial, peroneal and gastrocnemius veins when visible. The superficial great saphenous vein was  also interrogated. Spectral Doppler was utilized to evaluate flow at rest and with distal augmentation maneuvers in the common femoral, femoral and popliteal veins. COMPARISON:  None. FINDINGS: Contralateral Common Femoral Vein: Respiratory phasicity is normal and symmetric with the symptomatic side. No evidence of thrombus. Normal compressibility. Common Femoral Vein: No evidence of thrombus. Normal compressibility, respiratory phasicity and response to augmentation. Saphenofemoral Junction: Echogenic thrombus seen at the saphenofemoral junction. Profunda Femoral Vein: No evidence of thrombus. Normal compressibility and flow on color Doppler imaging. Femoral Vein: Echogenic partially occlusive thrombus seen throughout the proximal, mid, and distal right femoral vein. Popliteal Vein: Echogenic occlusive thrombus seen throughout the right popliteal vein. Calf Veins: Echogenic occlusive thrombus seen within the right peroneal and posterior tibial veins. Superficial Great Saphenous Vein: No evidence of thrombus. Normal compressibility. Venous Reflux:  None. Other Findings:  None. IMPRESSION: Positive study with extensive acute DVT extending from the proximal right femoral vein through the veins of the calf. Electronically Signed   By: Jeannine Boga M.D.   On: 10/25/2020 18:40   DG Chest Port 1 View  Result Date: 10/25/2020 CLINICAL DATA:  Chest pain. EXAM: PORTABLE CHEST 1 VIEW COMPARISON:  Single-view of the chest 10/15/2020 and 10/13/2020. CT chest 10/07/2020. FINDINGS: Right pleural effusion and opacities projecting over the right chest consistent with known chest wall and intrathoracic metastatic breast carcinoma again seen. Scattered opacities in the left chest are also again seen. Aeration has improved bilaterally since the most recent exam. Small right pleural effusion is noted. The left lung is clear. Heart size is normal. IMPRESSION: Aeration in both lungs appears improved compared to the most  recent study in this patient with extensive metastatic breast cancer. No new abnormality. Electronically Signed   By: Inge Rise M.D.   On: 10/25/2020 12:19   DG Chest Port 1 View  Result Date: 10/15/2020 CLINICAL DATA:  Pneumonia and pleural effusion. Metastatic breast cancer. EXAM: PORTABLE CHEST 1 VIEW COMPARISON:  10/13/2020 chest radiograph and prior studies FINDINGS: Cardiomediastinal silhouette is unchanged with RIGHT mediastinal fullness. A LEFT subclavian Port-A-Cath with tip overlying the UPPER RIGHT atrium again noted. Scattered opacities within both lungs are again noted with a RIGHT pleural effusion. There is no evidence of pneumothorax. No significant changes are identified. IMPRESSION: Unchanged appearance of the chest with scattered opacities within both lungs and RIGHT pleural effusion. Electronically Signed   By: Margarette Canada M.D.   On: 10/15/2020 11:06   DG Chest Port 1 View  Result Date: 10/13/2020 CLINICAL DATA:  Pleural effusion EXAM: PORTABLE CHEST 1 VIEW COMPARISON:  10/07/2020 FINDINGS: Unchanged positioning of left-sided Port-A-Cath. Stable cardiomediastinal contours. Persistent small right pleural effusion. Slight progression of airspace opacity throughout the right lung most pronounced within the right lung base. Similar interstitial opacities throughout the left lung. No pneumothorax. IMPRESSION: 1. Slight progression of airspace opacity throughout the right lung, most pronounced within the right lung base. 2. Persistent small right pleural effusion. Electronically Signed   By: Davina Poke D.O.   On: 10/13/2020 14:10   DG HIP UNILAT W OR W/O PELVIS 2-3 VIEWS RIGHT  Result Date: 10/25/2020 CLINICAL DATA:  History of metastatic breast cancer. Patient status post fall. Initial encounter. EXAM: DG HIP (WITH OR WITHOUT PELVIS) 2-3V RIGHT COMPARISON:  PET CT scan 07/19/2020. FINDINGS: No acute bony or joint abnormality is identified. Scattered sclerotic lesions are present  in both hips and the pelvis consistent with known metastatic breast cancer. Soft tissues are negative. IMPRESSION: No acute abnormality. Scattered sclerotic lesions consistent with known metastatic breast cancer. Electronically Signed   By: Inge Rise M.D.   On: 10/25/2020 14:09    PERFORMANCE STATUS (ECOG) : 3 - Symptomatic, >50% confined to bed  Review of Systems Unless otherwise noted, a complete review of systems is negative.  Physical Exam General: NAD Pulmonary: Unlabored Extremities: no edema, no joint deformities Skin: no rashes Neurological: Weakness but otherwise nonfocal  IMPRESSION: Follow-up visit.  Patient was more willing to engage in a conversation regarding goals today.  She denies any acute symptomatic complaints other than dry mouth.  Her oral intake remains poor.  Patient says that she has an appetite but does not like the taste of food.  Nutrition is following.  Her caloric intake is not adequate to ensure meaningful improvement.  Today, we discussed the option for nutritional support via feeding tube.  Patient was unsure about this and wanted time to think about it.  She does verbalize an understanding that she is highly likely to continue declining if oral intake remains minimal.  Patient's immediate goal is to pursue rehab with hope that she will functionally improve.  She has been working with PT and OT here at the hospital.  Care management is working on bed placement at a rehab center.  Patient was unsure if she would want to pursue further chemotherapy.  It is possible that treatment options might be limited by her overall declining performance status and failure to thrive.  We also talked about the option of hospice and patient stated that if she went that route she would prefer to be at her home.  We discussed CODE STATUS.  Patient stated clearly and repeatedly that she would not want to be resuscitated nor have her life prolonged artificially on  machines.  She was in agreement with DNR/DNI. This conversation was witnessed by [employer], Therapist, sports.  Patient declined my offer to call her children.  PLAN: -Continue current scope of treatment -DNR/DNI -Rehab with palliative care following  Case and plan discussed with Drs. Wieting and Tasia Catchings   Time Total: 30 minutes  Visit consisted of counseling and education dealing with the complex and emotionally intense issues of symptom management and palliative care in the setting of serious and potentially life-threatening illness.Greater than 50%  of this time was spent counseling and coordinating care related to the above assessment and plan.  Signed by: Altha Harm, PhD, NP-C

## 2020-11-09 NOTE — Progress Notes (Cosign Needed)
Patient has remained alert and oriented x 4 during shirt. Responds appropriatly but quiet. Patient denies any pain. Continued to educate patient on importance of oral intake. Handoff given to primary nurse.

## 2020-11-09 NOTE — Progress Notes (Signed)
I concur with the assessment and documentation completed by the student nurse.  Edd Arbour, RN [facility] Nursing Faculty.

## 2020-11-10 LAB — GLUCOSE, CAPILLARY
Glucose-Capillary: 103 mg/dL — ABNORMAL HIGH (ref 70–99)
Glucose-Capillary: 110 mg/dL — ABNORMAL HIGH (ref 70–99)
Glucose-Capillary: 118 mg/dL — ABNORMAL HIGH (ref 70–99)

## 2020-11-10 NOTE — TOC Progression Note (Signed)
Transition of Care [facility]) - Progression Note    Patient Details  Name: Jessica Willis MRN: [id number] Date of Birth: 13-Sep-1965  Transition of Care [facility]) CM/SW Contact  Shelbie Hutching, RN Phone Number: 11/10/2020, 1:48 PM  Clinical Narrative:    Jessica Willis is running the [employer], insurance has 72 hours to review.  [facility] will offer a bed but only if patient goes under hospice with hospice of the [place].  RNCM will discuss with patient this afternoon.    Expected Discharge Plan: [place] Barriers to Discharge: Continued Medical Work up  Expected Discharge Plan and Services Expected Discharge Plan: [place] In-house Referral: Hospice / Palliative Care Discharge Planning Services: CM Consult Post Acute Care Choice: [place] Living arrangements for the past 2 months: Single Family Home                 DME Arranged: N/A DME Agency: NA       HH Arranged: NA           Social Determinants of Health (SDOH) Interventions    Readmission Risk Interventions Readmission Risk Prevention Plan 11/01/2020 10/19/2020  Transportation Screening Complete Complete  Medication Review [employer]) Complete Complete  PCP or Specialist appointment within 3-5 days of discharge Complete Complete  HRI or Home Care Consult Complete Complete  SW Recovery Care/Counseling Consult Complete Complete  Palliative Care Screening Complete -  [place] Complete Not Applicable  Some recent data might be hidden

## 2020-11-10 NOTE — TOC Progression Note (Signed)
Transition of Care [facility]) - Progression Note    Patient Details  Name: Jessica Willis MRN: [id number] Date of Birth: 1966-01-07  Transition of Care [facility]) CM/SW Contact  Shelbie Hutching, RN Phone Number: 11/10/2020, 3:49 PM  Clinical Narrative:    RNCM met with patient at the bedside.  PT is in the room working with patient and patient is engaged in therapy session.  Discussed options for [place] or [facility] in [place] and [place].  Patient shook her head that she did not want to go that far that transportation would be an issue.  RNCM explained that patient would not be traveling but would go and stay for a short period of time.  Patient would like to think about it.  RNCM will follow up in the morning.  [facility] Medicaid is reviewing patient for rehab appropriateness.  [facility] will only accept under hospice care.    Expected Discharge Plan: [place] Barriers to Discharge: Continued Medical Work up  Expected Discharge Plan and Services Expected Discharge Plan: [place] In-house Referral: Hospice / Palliative Care Discharge Planning Services: CM Consult Post Acute Care Choice: [place] Living arrangements for the past 2 months: Single Family Home                 DME Arranged: N/A DME Agency: NA       HH Arranged: NA           Social Determinants of Health (SDOH) Interventions    Readmission Risk Interventions Readmission Risk Prevention Plan 11/01/2020 10/19/2020  Transportation Screening Complete Complete  Medication Review [employer] photographer) Complete Complete  PCP or Specialist appointment within 3-5 days of discharge Complete Complete  HRI or Home Care Consult Complete Complete  SW Recovery Care/Counseling Consult Complete Complete  Palliative Care Screening Complete -  [place] Complete Not Applicable  Some recent data might be hidden

## 2020-11-10 NOTE — Progress Notes (Signed)
Physical Therapy Treatment Patient Details Name: Jessica Willis MRN: [id number] DOB: February 22, 1966 Today's Date: 11/10/2020    History of Present Illness Pt is a 55 y.o. female presenting to hospital 3/15 with R leg paresthesias and discomfort (pt fell on R side after coming home from hospital and developed symptoms after fall).  Imaging (+) extensive acute DVT R LE extending from proximal R femoral vein through veins of the calf.  Pt admitted with FTT.  (+) COVID-19.  PMH includes anemia, invasive R breast CA, on chronic O2, chemotherapy, PNA, bone metastasis, R arm weakness, tachycardia, metastatic breast CA, VATS.    PT Comments    Pt resting in bed upon PT arrival; agreeable and appearing motivated to participate in PT session.  Tolerated LE ex's in bed with assist as needed.  SBA semi-supine to sitting edge of bed with increased time/effort to perform on own.   During standing attempts 1st trial pt unable to stand with 1 assist; able to come to full stand with max assist x1 2nd trial standing; unable to stand 3rd trial standing with 1 assist; and able to just clear pt's bottom from bed 4th trial standing with 1 assist (all with RW use and bed height elevated).  Then able to perform lateral scooting towards L along bed x3 trials with mod assist x1.  Tolerated sitting ex's fairly well.  Min to mod assist for LE's sit to semi-supine in bed.  Pt reporting she was tired end of session but felt good.  Will continue to focus on strengthening and progressive functional mobility per pt tolerance.   Follow Up Recommendations  SNF     Equipment Recommendations  Rolling walker with 5" wheels;3in1 (PT);Wheelchair (measurements PT);Wheelchair cushion (measurements PT);Hospital bed;Other (comment) (hoyer lift)    Recommendations for Other Services       Precautions / Restrictions Precautions Precautions: Fall Precaution Comments: Metastatic breast CA with bone metastasis; L chest  port Restrictions Weight Bearing Restrictions: No    Mobility  Bed Mobility Overal bed mobility: Needs Assistance Bed Mobility: Supine to Sit;Sit to Supine;Rolling Rolling: Supervision (logrolling R in bed x2 trials with use of bed rail)   Supine to sit: Supervision;HOB elevated Sit to supine: Min assist;Mod assist   General bed mobility comments: increased time to perform on own (and use of bed rail) semi-supine to sitting edge of bed; min to mod assist for B LE's sit to semi-supine in bed    Transfers Overall transfer level: Needs assistance Equipment used: Rolling walker (2 wheeled) Transfers: Sit to/from Stand;Lateral/Scoot Transfers Sit to Stand: Max assist;From elevated surface        Lateral/Scoot Transfers: Mod assist;From elevated surface General transfer comment: unable to stand first trial standing; able to come to full stand 2nd trial standing (although pt feeling off-balance in standing); unable to stand 3rd trial standing; able to just clear bottom off of bed 4th trial standing; vc's for UE and LE placement; assist to initiate stands  Ambulation/Gait             General Gait Details: pt did not feel stable in standing so unable to attempt taking steps with RW   Stairs             Wheelchair Mobility    Modified Rankin (Stroke Patients Only)       Balance Overall balance assessment: Needs assistance Sitting-balance support: No upper extremity supported;Feet supported Sitting balance-Leahy Scale: Good Sitting balance - Comments: steady sitting reaching within BOS  Standing balance comment: steady static standing with B UE support on RW (although pt reporting feeling off-balance)                            Cognition Arousal/Alertness: Awake/alert Behavior During Therapy: Flat affect Overall Cognitive Status: Within Functional Limits for tasks assessed                                 General Comments: Pt  tends to avoid eye contact.      Exercises Total Joint Exercises Ankle Circles/Pumps: AROM;Strengthening;Both;10 reps;Supine Quad Sets: AROM;Strengthening;Both;10 reps;Supine Gluteal Sets: AROM;Strengthening;Both;10 reps;Supine Short Arc Quad: AROM;Strengthening;Both;10 reps;Supine Heel Slides: AAROM;Strengthening;Both;10 reps;Supine Hip ABduction/ADduction: AAROM;Strengthening;Both;10 reps;Supine Straight Leg Raises: AAROM;Strengthening;Both;10 reps;Supine Long Arc Quad: AROM;Strengthening;Both;10 reps;Seated General Exercises - Lower Extremity Long Arc Quad: AROM;Strengthening;Both;Seated (minimal B hip flexion ROM d/t weakness)    General Comments   Nursing cleared pt for participation in physical therapy.  Pt agreeable to PT session.      Pertinent Vitals/Pain Pain Assessment: 0-10 Faces Pain Scale: Hurts a little bit Pain Location: pt's "bottom" Pain Descriptors / Indicators: Burning Pain Intervention(s): Limited activity within patient's tolerance;Monitored during session;Repositioned  HR 96-112 bpm during sessions activities (100 bpm at rest end of session).    Home Living                      Prior Function            PT Goals (current goals can now be found in the care plan section) Acute Rehab PT Goals Patient Stated Goal: To return home PT Goal Formulation: With patient Time For Goal Achievement: 11/11/20 Potential to Achieve Goals: Fair Progress towards PT goals: Progressing toward goals    Frequency    Min 2X/week      PT Plan Current plan remains appropriate    Co-evaluation              AM-PAC PT "6 Clicks" Mobility   Outcome Measure  Help needed turning from your back to your side while in a flat bed without using bedrails?: A Little Help needed moving from lying on your back to sitting on the side of a flat bed without using bedrails?: A Little Help needed moving to and from a bed to a chair (including a wheelchair)?: A  Lot Help needed standing up from a chair using your arms (e.g., wheelchair or bedside chair)?: Total Help needed to walk in hospital room?: Total Help needed climbing 3-5 steps with a railing? : Total 6 Click Score: 11    End of Session Equipment Utilized During Treatment: Gait belt Activity Tolerance: Patient tolerated treatment well Patient left: in bed;with call bell/phone within reach;with bed alarm set;Other (comment) (B heels floating via pillow support) Nurse Communication: Mobility status;Precautions PT Visit Diagnosis: Other abnormalities of gait and mobility (R26.89);Muscle weakness (generalized) (M62.81);History of falling (Z91.81);Difficulty in walking, not elsewhere classified (R26.2)     Time: 9622-2979 PT Time Calculation (min) (ACUTE ONLY): 53 min  Charges:  $Therapeutic Exercise: 23-37 mins $Therapeutic Activity: 23-37 mins                    Leitha Bleak, PT 11/10/20, 4:01 PM

## 2020-11-10 NOTE — Progress Notes (Signed)
Patient ID: Jessica Willis, female   DOB: 06/26/1966, 55 y.o.   MRN: [id number] [facility] PROGRESS NOTE  Jessica Willis FIE:[id number] DOB: 02-25-1966 DOA: 10/25/2020 PCP: [facility]  HPI/Subjective: Patient again with poor eye contact today.  Patient did have abdominal pain early morning but that is gone away.  No shortness of breath or chest pain.  Patient admitted on 10/25/2020 with malnutrition and failure to thrive and a fall at home.  Objective: Vitals:   11/10/20 1200 11/10/20 1617  BP: 122/67 114/71  Pulse: (!) 104 93  Resp:    Temp: 97.9 F (36.6 C) 98.2 F (36.8 C)  SpO2: 100% 100%    Intake/Output Summary (Last 24 hours) at 11/10/2020 1643 Last data filed at 11/10/2020 1320 Gross per 24 hour  Intake --  Output 100 ml  Net -100 ml   Filed Weights   10/26/20 0400 10/27/20 0400 10/29/20 0606  Weight: 68.6 kg 69.9 kg 69.4 kg    ROS: Review of Systems  Respiratory: Negative for shortness of breath.   Cardiovascular: Negative for chest pain.  Gastrointestinal: Negative for abdominal pain, nausea and vomiting.   Exam: Physical Exam HENT:     Head: Normocephalic.     Mouth/Throat:     Pharynx: No oropharyngeal exudate.  Eyes:     General: Lids are normal.     Conjunctiva/sclera: Conjunctivae normal.  Cardiovascular:     Rate and Rhythm: Normal rate and regular rhythm.     Heart sounds: Normal heart sounds, S1 normal and S2 normal.  Pulmonary:     Breath sounds: Examination of the right-lower field reveals decreased breath sounds. Examination of the left-lower field reveals decreased breath sounds. Decreased breath sounds present. No wheezing, rhonchi or rales.  Abdominal:     Palpations: Abdomen is soft.     Tenderness: There is no abdominal tenderness.  Musculoskeletal:     Right knee: Swelling present.     Left knee: Swelling present.  Skin:    General: Skin is warm.     Findings: No rash.  Neurological:     Mental  Status: She is alert and oriented to person, place, and time.       Data Reviewed: Basic Metabolic Panel: Recent Labs  Lab 11/05/20 0615 11/06/20 0421 11/07/20 0417 11/08/20 0428 11/09/20 0500  NA 142 139 141 142 142  K 4.2 4.1 4.0 3.9 4.0  CL 102 101 102 104 104  CO2 32 31 30 32 30  GLUCOSE 83 83 87 93 95  BUN 24* 22* 19 19 22*  CREATININE 0.35* <0.30* <0.30* 0.30* 0.30*  CALCIUM 8.3* 8.1* 8.0* 7.9* 8.1*   Liver Function Tests: Recent Labs  Lab 11/04/20 0525 11/05/20 0615 11/06/20 0421 11/07/20 0417  AST 66* 68* 66* 70*  ALT 20 21 23 22   ALKPHOS 324* 314* 326* 333*  BILITOT 0.6 0.6 0.8 0.7  PROT 5.0* 5.2* 5.0* 5.0*  ALBUMIN 1.9* 1.8* 1.8* 1.7*   CBC: Recent Labs  Lab 11/05/20 0615 11/06/20 0421 11/07/20 0417 11/08/20 0428 11/09/20 0500  WBC 22.6* 23.5* 23.6* 23.4* 25.1*  HGB 8.1* 8.0* 8.4* 7.7* 8.4*  HCT 27.3* 27.5* 27.6* 26.3* 28.5*  MCV 87.8 87.9 87.6 89.2 87.4  PLT 321 309 317 273 304   BNP (last 3 results) Recent Labs    06/15/20 2125 10/09/20 0518  BNP 49.6 180.5*    CBG: Recent Labs  Lab 11/09/20 1645 11/09/20 2132 11/10/20 0711 11/10/20 1139 11/10/20 1619  GLUCAP 114*  141* 103* 110* 118*    Scheduled Meds: . apixaban  5 mg Oral BID  . vitamin C  500 mg Oral Daily  . calcium carbonate  1 tablet Oral Q breakfast  . Chlorhexidine Gluconate Cloth  6 each Topical Daily  . dronabinol  5 mg Oral BID AC  . feeding supplement  237 mL Oral QID  . insulin aspart  0-5 Units Subcutaneous QHS  . insulin aspart  0-9 Units Subcutaneous TID WC  . magnesium chloride  1 tablet Oral Daily  . metoprolol tartrate  50 mg Oral BID  . polyethylene glycol  17 g Oral BID  . predniSONE  5 mg Oral Q breakfast  . sodium chloride flush  10-40 mL Intracatheter Q12H    Assessment/Plan:  1. Failure to thrive, severe protein calorie malnutrition.  Patient states that she is eating a little bit better.  When I saw her this morning she was also still  thinking about the next steps of treatment. 2. Right femoral DVT.  Continue Eliquis 3. 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  Last PET scan showed hypermetabolic lesion in the pancreatic tail.  Patient also has metastases seen in the orbits.  Oncology does not believe that the patient is a candidate for further treatments.  Patient would be a candidate for the hospice home if she is agreeable. 4. COVID-19 infection.  Completed remdesivir. 5. Anemia of chronic disease.  Patient given a unit of packed red blood cells during the hospital course 6. Leukocytosis likely steroid-induced 7. Chronic respiratory failure.  Continue 3 L of oxygen 8. Generalized weakness.  Physical therapy recommending rehab.  If she decides to go to rehab will have to wait until 11/17/2019 until isolation of Covid discontinued. 9. Stage II decubiti of the right and left buttocks, present on admission.  See full description below 10. Patient is a DNR  Pressure Injury 10/25/20 Buttocks Left;Right Stage 2 -  Partial thickness loss of dermis presenting as a shallow open injury with a red, pink wound bed without slough. (Active)  10/25/20 2100  Location: Buttocks  Location Orientation: Left;Right  Staging: Stage 2 -  Partial thickness loss of dermis presenting as a shallow open injury with a red, pink wound bed without slough.  Wound Description (Comments):   Present on Admission: Yes       Code Status:     Code Status Orders  (From admission, onward)         Start     Ordered   11/09/20 1323  Do not attempt resuscitation (DNR)  Continuous       Question Answer Comment  In the event of cardiac or respiratory ARREST Do not call a "code blue"   In the event of cardiac or respiratory ARREST Do not perform Intubation, CPR, defibrillation or ACLS   In the event of cardiac or respiratory ARREST Use medication by any route,  position, wound care, and other measures to relive pain and suffering. May use oxygen, suction and manual treatment of airway obstruction as needed for comfort.      11/09/20 1322        Code Status History    Date Active Date Inactive Code Status Order ID Comments User Context   10/25/2020 1530 11/09/2020 1322 Full Code [id number]  Criss Alvine, DO ED   10/13/2020 1828 10/22/2020 0035 DNR [id number]  Earlie Server, MD Inpatient  10/07/2020 1552 10/13/2020 1828 Full Code [id number]  Ivor Costa, MD Inpatient   06/16/2020 0010 07/01/2020 1818 Full Code [id number]  Elwyn Reach, MD ED   06/23/2015 2341 06/30/2015 1944 Full Code [id number]  Lance Coon, MD Inpatient   Advance Care Planning Activity    Advance Directive Documentation   Flowsheet Row Most Recent Value  Type of Advance Directive Healthcare Power of Attorney, Living will  Pre-existing out of facility DNR order (yellow form or pink MOST form) --  "MOST" Form in Place? --     Family Communication: Patient again refused me speaking with her family. Disposition Plan: Status is: Inpatient  Dispo: The patient is from: Home              Anticipated d/c is to: Patient will be a good candidate for the hospice home but she still has yet to make a decision on that may end up going to rehab.              Patient currently medically stable to be discharged but will have to wait out the isolation period for Covid infection before disposition   Difficult to place patient.  Yes.  Time spent: 28 minutes, case discussed with transitional care team, palliative care and oncology  Nichelle Renwick [facility]  [facility]

## 2020-11-11 NOTE — Plan of Care (Signed)
  Problem: Education: Goal: Knowledge of General Education information will improve Description: Including pain rating scale, medication(s)/side effects and non-pharmacologic comfort measures Outcome: Progressing   Problem: Clinical Measurements: Goal: Ability to maintain clinical measurements within normal limits will improve Outcome: Progressing Goal: Diagnostic test results will improve Outcome: Progressing   Problem: Pain Managment: Goal: General experience of comfort will improve Outcome: Progressing   Problem: Health Behavior/Discharge Planning: Goal: Ability to manage health-related needs will improve Outcome: Not Progressing Note: Pt shows no interest in trying to perform ADL's    Problem: Nutrition: Goal: Adequate nutrition will be maintained Outcome: Not Progressing Note: Pt continues to have a very poor appetite

## 2020-11-11 NOTE — Progress Notes (Addendum)
Patient ID: Jessica Willis, female   DOB: Jun 30, 1966, 55 y.o.   MRN: [id number] [facility] PROGRESS NOTE  Jessica Willis MHD:[id number] DOB: 1966-01-07 DOA: 10/25/2020 PCP: [facility]  HPI/Subjective: Patient seen earlier in the day and at that time was undecided on further plans.  Feels okay.  Still with some poor appetite.  Offers no complaints.  Admitted with fall and not eating well.  Objective: Vitals:   11/11/20 0847 11/11/20 1142  BP: 113/66 118/62  Pulse: (!) 108 (!) 115  Resp: 18 17  Temp: 98.2 F (36.8 C) 97.8 F (36.6 C)  SpO2: 100% 100%   No intake or output data in the 24 hours ending 11/11/20 1550 Filed Weights   10/26/20 0400 10/27/20 0400 10/29/20 0606  Weight: 68.6 kg 69.9 kg 69.4 kg    ROS: Review of Systems  Respiratory: Negative for shortness of breath.   Cardiovascular: Negative for chest pain.  Gastrointestinal: Negative for abdominal pain, nausea and vomiting.   Exam: Physical Exam HENT:     Head: Normocephalic.     Mouth/Throat:     Pharynx: No oropharyngeal exudate.  Eyes:     General: Lids are normal.     Conjunctiva/sclera: Conjunctivae normal.  Cardiovascular:     Rate and Rhythm: Regular rhythm. Tachycardia present.     Heart sounds: Normal heart sounds, S1 normal and S2 normal.  Pulmonary:     Breath sounds: Normal breath sounds. No decreased breath sounds, wheezing, rhonchi or rales.  Abdominal:     Palpations: Abdomen is soft.     Tenderness: There is no abdominal tenderness.  Musculoskeletal:     Right lower leg: Swelling present.     Left lower leg: Swelling present.  Skin:    General: Skin is warm.     Comments: Right chest wall mass covered with bandage.  Neurological:     Mental Status: She is alert.     Comments: Patient answers questions appropriately.       Data Reviewed: Basic Metabolic Panel: Recent Labs  Lab 11/05/20 0615 11/06/20 0421 11/07/20 0417 11/08/20 0428  11/09/20 0500  NA 142 139 141 142 142  K 4.2 4.1 4.0 3.9 4.0  CL 102 101 102 104 104  CO2 32 31 30 32 30  GLUCOSE 83 83 87 93 95  BUN 24* 22* 19 19 22*  CREATININE 0.35* <0.30* <0.30* 0.30* 0.30*  CALCIUM 8.3* 8.1* 8.0* 7.9* 8.1*   Liver Function Tests: Recent Labs  Lab 11/05/20 0615 11/06/20 0421 11/07/20 0417  AST 68* 66* 70*  ALT 21 23 22   ALKPHOS 314* 326* 333*  BILITOT 0.6 0.8 0.7  PROT 5.2* 5.0* 5.0*  ALBUMIN 1.8* 1.8* 1.7*   CBC: Recent Labs  Lab 11/05/20 0615 11/06/20 0421 11/07/20 0417 11/08/20 0428 11/09/20 0500  WBC 22.6* 23.5* 23.6* 23.4* 25.1*  HGB 8.1* 8.0* 8.4* 7.7* 8.4*  HCT 27.3* 27.5* 27.6* 26.3* 28.5*  MCV 87.8 87.9 87.6 89.2 87.4  PLT 321 309 317 273 304  BNP (last 3 results) Recent Labs    06/15/20 2125 10/09/20 0518  BNP 49.6 180.5*    CBG: Recent Labs  Lab 11/09/20 1645 11/09/20 2132 11/10/20 0711 11/10/20 1139 11/10/20 1619  GLUCAP 114* 141* 103* 110* 118*     Scheduled Meds: . apixaban  5 mg Oral BID  . vitamin C  500 mg Oral Daily  . calcium carbonate  1 tablet Oral Q breakfast  . Chlorhexidine Gluconate Cloth  6 each  Topical Daily  . dronabinol  5 mg Oral BID AC  . feeding supplement  237 mL Oral QID  . magnesium chloride  1 tablet Oral Daily  . metoprolol tartrate  50 mg Oral BID  . polyethylene glycol  17 g Oral BID  . predniSONE  5 mg Oral Q breakfast  . sodium chloride flush  10-40 mL Intracatheter Q12H   Assessment/Plan:  1. 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  Patient also has lesion on pancreatic tail and metastases in the orbits.  Patient is not a candidate for further treatments.  Patient would be a candidate for the hospice home if she is agreeable.  Patient is still undecided what she wants to do. 2. Failure to thrive and severe protein calorie malnutrition.  Continue Ensure. 3. Right femoral DVT.  On  Eliquis 4. COVID-19 infection completed 3 days of remdesivir 5. Anemia of chronic disease.  Patient given a unit of packed red blood cells during the hospital course 6. Leukocytosis.  Steroid-induced. 7. Chronic respiratory failure.  Continue chronic 3 L of oxygen 8. Generalized weakness.  Patient will have to wait till 11/16/2020 until Covid isolation discontinued to go out to rehab or hospice home 9. Stage II decubiti on the right and left buttocks.  Present on admission.  See full description below 10. Patient is a DO NOT RESUSCITATE  Pressure Injury 10/25/20 Buttocks Left;Right Stage 2 -  Partial thickness loss of dermis presenting as a shallow open injury with a red, pink wound bed without slough. (Active)  10/25/20 2100  Location: Buttocks  Location Orientation: Left;Right  Staging: Stage 2 -  Partial thickness loss of dermis presenting as a shallow open injury with a red, pink wound bed without slough.  Wound Description (Comments):   Present on Admission: Yes       Code Status:     Code Status Orders  (From admission, onward)         Start     Ordered   11/09/20 1323  Do not attempt resuscitation (DNR)  Continuous       Question Answer Comment  In the event of cardiac or respiratory ARREST Do not call a "code blue"   In the event of cardiac or respiratory ARREST Do not perform Intubation, CPR, defibrillation or ACLS   In the event of cardiac or respiratory ARREST Use medication by any route, position, wound care, and other measures to relive pain and suffering. May use oxygen, suction and manual treatment of airway obstruction as needed for comfort.      11/09/20 1322        Code Status History    Date Active Date Inactive Code Status Order ID Comments User Context   10/25/2020 1530 11/09/2020 1322 Full Code [id number]  Criss Alvine, DO ED   10/13/2020 1828 10/22/2020 0035 DNR [id number]  Earlie Server, MD Inpatient   10/07/2020 1552 10/13/2020 1828 Full Code [id number]  Ivor Costa, MD  Inpatient   06/16/2020 0010 07/01/2020 1818 Full Code [id number]  Elwyn Reach, MD ED   06/23/2015 2341 06/30/2015 1944 Full Code [id number]  Lance Coon, MD Inpatient   Advance Care Planning Activity    Advance Directive Documentation   Flowsheet Row Most Recent Value  Type of Advance Directive Healthcare Power of Attorney, Living will  Pre-existing out of facility DNR order (yellow form or pink MOST form) --  "MOST" Form  in Place? --     Family Communication: Refused for me to call family again today Disposition Plan: Status is: Inpatient  Dispo: The patient is from: Home              Anticipated d/c is to: To be determined              Patient currently qualifies for hospice care because she is not a candidate for further chemo treatments.   Difficult to place patient.  Yes.  Needs to wait until 11/16/2020 for disposition and till Covid isolation 21 days is over.  Time spent: 27 minutes  [place]

## 2020-11-11 NOTE — Progress Notes (Signed)
Occupational Therapy Treatment Patient Details Name: Jessica Willis MRN: [id number] DOB: February 18, 1966 Today's Date: 11/11/2020    History of present illness Pt is a 55 y.o. female presenting to hospital 3/15 with R leg paresthesias and discomfort (pt fell on R side after coming home from hospital and developed symptoms after fall).  Imaging (+) extensive acute DVT R LE extending from proximal R femoral vein through veins of the calf.  Pt admitted with FTT.  (+) COVID-19.  PMH includes anemia, invasive R breast CA, on chronic O2, chemotherapy, PNA, bone metastasis, R arm weakness, tachycardia, metastatic breast CA, VATS.   OT comments  Pt seen for OT treatment this date to f/u re: safety with ADLs/ADL mobility. Pt able to come to EOB sitting with CGA, but very effortful and takes extended time and use of bed rails. PT/OT Co-tx completed to maximize pt's benefits as she has low functional tolerance. OT/PT engage pt in 4 sit to stand trials with 3/4 being successful in clearing bed surface. Pt needs ~MIN/MOD A +2 for each stand with OT/PT initially blocking knees, but pt progressing to not needing knee blocking. She is noted to have difficulty with weight shift in standing and therefore requires ~MOD A +2 to MIP x5 each side. OT engages pt in seated UB ADLs including bathing and grooming for which she requires SETUP and MIN A for applying deodorant d/t limited grip strength. Pt requires MAX A for posterior LB bathing and peri care in standing with PT assisting to sustain pt's stand. OT attempts to engage pt in alternating her hand from the walker to contribute to peri care, but pt limited by weakness. Pt returned to bed with MIN/MOD A. Left with all needs met and in reach. OT engages pt in ed re: advocating with her care team for floating heels off bed to prevent skin breakdown. Pt with moderate reception. Will continue to follow and continue to anticipate pt will require STR f/u in SNF setting to regain  appropriate strength for safe completion of ADLs.    Follow Up Recommendations  SNF    Equipment Recommendations  None recommended by OT    Recommendations for Other Services      Precautions / Restrictions Precautions Precautions: Fall Precaution Comments: Metastatic breast CA with bone metastasis; L chest port Restrictions Weight Bearing Restrictions: No       Mobility Bed Mobility Overal bed mobility: Needs Assistance Bed Mobility: Supine to Sit;Sit to Supine     Supine to sit: Min guard Sit to supine: Min assist;Mod assist   General bed mobility comments: increasd time and assist for back to bed to manage LEs.    Transfers Overall transfer level: Needs assistance Equipment used: Rolling walker (2 wheeled) Transfers: Sit to/from Stand Sit to Stand: Min assist;Mod assist;+2 physical assistance;From elevated surface         General transfer comment: min to mod assist x2 standing from elevated bed (about 6 inches higher) up to RW; assist to initiate stand via support at hip with bed pad; x4 trials (pt able to stand fully upright 1st, 2nd, and 4th trials; unable to clear hips from bed 3rd trial); vc's for UE/LE placement    Balance Overall balance assessment: Needs assistance Sitting-balance support: No upper extremity supported;Feet supported Sitting balance-Leahy Scale: Good Sitting balance - Comments: G static   Standing balance support: Bilateral upper extremity supported Standing balance-Leahy Scale: Poor Standing balance comment: P, pt requires support to sustain static stand. Uses RW for UE  support, but still requires at least MIN A of external support from therapist.                           ADL either performed or assessed with clinical judgement   ADL Overall ADL's : Needs assistance/impaired     Grooming: Wash/dry face;Set up;Sitting;Applying deodorant;Minimal assistance Grooming Details (indicate cue type and reason): increased time,  MIN A for applying deodorant d/t decreased grip strength Upper Body Bathing: Minimal assistance;Sitting Upper Body Bathing Details (indicate cue type and reason): increased time, MIN A for thorough completion Lower Body Bathing: Maximal assistance;Sit to/from stand Lower Body Bathing Details (indicate cue type and reason): MIN/MOD A +2 to stand, PT assists pt with maintaining static stand, OT attempts to engage pt in LB bathing, but she is unable to alternate a hand from the RW as she needs both for support d/t weakness and poor balance. Ultimately, pt requires MAX A. Upper Body Dressing : Set up;Sitting   Lower Body Dressing: Maximal assistance;Bed level Lower Body Dressing Details (indicate cue type and reason): figure 4 in bed to don socks     Toileting- Clothing Manipulation and Hygiene: Maximal assistance;Sit to/from stand       Functional mobility during ADLs: Minimal assistance;Moderate assistance;+2 for physical assistance;Rolling walker (to take 3-4 shufflinf side steps to her left towards [facility] to optimize positioning.)       Vision Patient Visual Report: No change from baseline     Perception     Praxis      Cognition Arousal/Alertness: Awake/alert Behavior During Therapy: Flat affect Overall Cognitive Status: Within Functional Limits for tasks assessed                                 General Comments: Pt tends to avoid eye contact.        Exercises Other Exercises Other Exercises: OT engages pt in seated UB bathing and dressing tasks, standing LB bathing, toileting and dressing with PT assisting pt to sustain stand and MAX A for actual ADL tasks. Pt fatigues easily and requires several rest breaks.   Shoulder Instructions       General Comments      Pertinent Vitals/ Pain       Pain Assessment: Faces Faces Pain Scale: Hurts little more Pain Location: pt's "bottom" Pain Descriptors / Indicators: Burning;Grimacing Pain Intervention(s): Limited  activity within patient's tolerance;Monitored during session;Repositioned;Other (comment) (applied barrier ointment and new meplex after engaging pt in bathing/dressing tasks.)  Home Living Family/patient expects to be discharged to:: Private residence Living Arrangements: Non-relatives/Friends Available Help at Discharge: Family Type of Home: House Home Access: Stairs to enter Technical brewer of Steps: 1   Home Layout: One level               Home Equipment: Bedside commode;Walker - 2 wheels          Prior Functioning/Environment Level of Independence: Independent with assistive device(s)        Comments: Ambulates with RW household distances; recent falls prior to hospital admission; decreased activity tolerance   Frequency  Min 1X/week        Progress Toward Goals  OT Goals(current goals can now be found in the care plan section)  Progress towards OT goals: Progressing toward goals  Acute Rehab OT Goals Patient Stated Goal: To return home OT Goal Formulation: With patient Time For  Goal Achievement: 11/23/20 Potential to Achieve Goals: Good  Plan Discharge plan remains appropriate;Frequency remains appropriate    Co-evaluation    PT/OT/SLP Co-Evaluation/Treatment: Yes Reason for Co-Treatment: Complexity of the patient's impairments (multi-system involvement);For patient/therapist safety PT goals addressed during session: Mobility/safety with mobility;Balance;Proper use of DME OT goals addressed during session: ADL's and self-care      AM-PAC OT "6 Clicks" Daily Activity     Outcome Measure   Help from another person eating meals?: None Help from another person taking care of personal grooming?: A Little Help from another person toileting, which includes using toliet, bedpan, or urinal?: A Lot Help from another person bathing (including washing, rinsing, drying)?: A Lot Help from another person to put on and taking off regular upper body  clothing?: A Little Help from another person to put on and taking off regular lower body clothing?: A Little 6 Click Score: 17    End of Session Equipment Utilized During Treatment: Gait belt;Rolling walker;Oxygen  OT Visit Diagnosis: Unsteadiness on feet (R26.81);Repeated falls (R29.6);Muscle weakness (generalized) (M62.81)   Activity Tolerance Patient tolerated treatment well   Patient Left in bed;with call bell/phone within reach;with bed alarm set   Nurse Communication Mobility status        Time: 1025-1105 OT Time Calculation (min): 40 min  Charges: OT General Charges $OT Visit: 1 Visit OT Treatments $Self Care/Home Management : 8-22 mins  Gerrianne Scale, MS, OTR/L ascom [phone] 11/11/20, 1:47 PM

## 2020-11-11 NOTE — Evaluation (Signed)
Physical Therapy Re-Evaluation Patient Details Name: Jessica Willis MRN: [id number] DOB: October 06, 1965 Today's Date: 11/11/2020   History of Present Illness  Pt is a 55 y.o. female presenting to hospital 3/15 with R leg paresthesias and discomfort (pt fell on R side after coming home from hospital and developed symptoms after fall).  Imaging (+) extensive acute DVT R LE extending from proximal R femoral vein through veins of the calf.  Pt admitted with FTT.  (+) COVID-19.  PMH includes anemia, invasive R breast CA, on chronic O2, chemotherapy, PNA, bone metastasis, R arm weakness, tachycardia, metastatic breast CA, VATS.  Clinical Impression  PT re-evaluation performed d/t extended hospitalization.  PT/OT co-session performed (see OT note for further session details).  Prior to hospital admission, pt was ambulatory household distances with RW; recent falls noted prior to hospital admission.  Currently pt is CGA semi-supine to sitting edge of bed; min to mod assist x2 to stand up to RW from elevated bed; and min assist x2 to sidestep a few feet laterally to L with RW and then take 5 steps in place B LE's with RW support (decreased B LE foot clearance noted).  Min to mod assist for B LE's sit to semi-supine in bed.  B LE's appearing stronger from initial evaluation but would benefit from continued strengthening d/t noted weakness.  Pt would benefit from skilled PT to address noted impairments and functional limitations (see below for any additional details).  Upon hospital discharge, pt would benefit from STR.  POC reviewed and updated.    Follow Up Recommendations SNF    Equipment Recommendations  Rolling walker with 5" wheels;3in1 (PT);Wheelchair (measurements PT);Wheelchair cushion (measurements PT);Hospital bed (hoyer lift)    Recommendations for Other Services OT consult     Precautions / Restrictions Precautions Precautions: Fall Precaution Comments: Metastatic breast CA with bone  metastasis; L chest port Restrictions Weight Bearing Restrictions: No      Mobility  Bed Mobility Overal bed mobility: Needs Assistance Bed Mobility: Supine to Sit;Sit to Supine     Supine to sit: Min guard Sit to supine: Min assist;Mod assist   General bed mobility comments: increased time to perform on own (and use of bed rail) semi-supine to sitting edge of bed; min to mod assist for B LE's sit to semi-supine in bed    Transfers Overall transfer level: Needs assistance Equipment used: Rolling walker (2 wheeled) Transfers: Sit to/from Stand Sit to Stand: Min assist;Mod assist;+2 physical assistance;From elevated surface         General transfer comment: min to mod assist x2 standing from elevated bed (about 6 inches higher) up to RW; assist to initiate stand via support at hip with bed pad; x4 trials (pt able to stand fully upright 1st, 2nd, and 4th trials; unable to clear hips from bed 3rd trial); vc's for UE/LE placement  Ambulation/Gait Ambulation/Gait assistance: Min assist;+2 physical assistance Gait Distance (Feet):  (pt able to side step to L along bed x3 feet with RW min assist x2; able to take steps in place x5 reps B LE's) Assistive device: Rolling walker (2 wheeled)   Gait velocity: decreased   General Gait Details: decreased B LE step length/foot clearance with steps  Stairs            Wheelchair Mobility    Modified Rankin (Stroke Patients Only)       Balance Overall balance assessment: Needs assistance Sitting-balance support: No upper extremity supported;Feet supported Sitting balance-Leahy Scale: Good Sitting balance -  Comments: steady sitting reaching within BOS   Standing balance support: Bilateral upper extremity supported Standing balance-Leahy Scale: Fair Standing balance comment: steady static standing with B UE support on RW                             Pertinent Vitals/Pain Pain Assessment: Faces Faces Pain Scale:  Hurts a little bit Pain Location: pt's "bottom" Pain Descriptors / Indicators: Burning Pain Intervention(s): Limited activity within patient's tolerance;Monitored during session;Repositioned  HR 100-104 bpm beginning of session at rest and 116 bpm end of session at rest: nurse notified.    Home Living Family/patient expects to be discharged to:: Private residence Living Arrangements: Non-relatives/Friends Available Help at Discharge: Family Type of Home: House Home Access: Stairs to enter   Technical brewer of Steps: 1 Home Layout: One level Home Equipment: Bedside commode;Walker - 2 wheels      Prior Function Level of Independence: Independent with assistive device(s)         Comments: Ambulates with RW household distances; recent falls prior to hospital admission; decreased activity tolerance     Hand Dominance   Dominant Hand: Right    Extremity/Trunk Assessment   Upper Extremity Assessment Upper Extremity Assessment: Generalized weakness    Lower Extremity Assessment Lower Extremity Assessment: RLE deficits/detail;LLE deficits/detail RLE Deficits / Details: at least 3/5 AROM DF/PF; 3-/5 hip flexion and knee flexion/extension AROM LLE Deficits / Details: at least 3/5 AROM DF/PF; 3-/5 hip flexion and knee flexion/extension AROM    Cervical / Trunk Assessment Cervical / Trunk Assessment: Other exceptions Cervical / Trunk Exceptions: forward flexed neck/head (pt able to hold up head with vc's)  Communication   Communication: No difficulties  Cognition Arousal/Alertness: Awake/alert Behavior During Therapy: Flat affect Overall Cognitive Status: Within Functional Limits for tasks assessed                                 General Comments: Pt tends to avoid eye contact.      General Comments   Nursing cleared pt for participation in physical therapy.  Pt sleeping upon therapy arrival but pt agreeable to PT session once woken.    Exercises      Assessment/Plan    PT Assessment Patient needs continued PT services  PT Problem List Decreased strength;Decreased activity tolerance;Decreased balance;Decreased mobility;Decreased knowledge of use of DME;Decreased knowledge of precautions;Pain;Decreased skin integrity       PT Treatment Interventions DME instruction;Gait training;Functional mobility training;Therapeutic activities;Therapeutic exercise;Balance training;Stair training;Patient/family education    PT Goals (Current goals can be found in the Care Plan section)  Acute Rehab PT Goals Patient Stated Goal: To return home PT Goal Formulation: With patient Time For Goal Achievement: 11/25/20 Potential to Achieve Goals: Fair    Frequency Min 2X/week   Barriers to discharge Decreased caregiver support      Co-evaluation PT/OT/SLP Co-Evaluation/Treatment: Yes Reason for Co-Treatment: For patient/therapist safety;To address functional/ADL transfers PT goals addressed during session: Mobility/safety with mobility;Balance;Proper use of DME OT goals addressed during session: ADL's and self-care       AM-PAC PT "6 Clicks" Mobility  Outcome Measure Help needed turning from your back to your side while in a flat bed without using bedrails?: A Little Help needed moving from lying on your back to sitting on the side of a flat bed without using bedrails?: A Little Help needed moving to and from  a bed to a chair (including a wheelchair)?: A Lot Help needed standing up from a chair using your arms (e.g., wheelchair or bedside chair)?: Total Help needed to walk in hospital room?: Total Help needed climbing 3-5 steps with a railing? : Total 6 Click Score: 11    End of Session Equipment Utilized During Treatment: Gait belt Activity Tolerance: Patient tolerated treatment well Patient left: in bed;with call bell/phone within reach;with bed alarm set;Other (comment) (B heels floating via pillow support) Nurse Communication: Mobility  status;Precautions PT Visit Diagnosis: Other abnormalities of gait and mobility (R26.89);Muscle weakness (generalized) (M62.81);History of falling (Z91.81);Difficulty in walking, not elsewhere classified (R26.2)    Time: 1791-5056 PT Time Calculation (min) (ACUTE ONLY): 42 min   Charges:   PT Evaluation $PT Re-evaluation: 1 Re-eval PT Treatments $Therapeutic Activity: 23-37 mins       Leitha Bleak, PT 11/11/20, 11:44 AM

## 2020-11-11 NOTE — TOC Progression Note (Signed)
Transition of Care [facility]) - Progression Note    Patient Details  Name: Genean Adamski MRN: [id number] Date of Birth: [birth year]  Transition of Care [facility]) CM/SW Contact  Shelbie Hutching, RN Phone Number: 11/11/2020, 4:23 PM  Clinical Narrative:    Patient is agreeing to [facility].  She wants to talk with her family first but she is not able to get them on the phone this afternoon.  TOC needs to check in on patient in the morning to ensure she hasn't changed her mind.  Insurance has approved her and [place] can accept her tomorrow.  They are aware she is 17 days out from [place] +.     Expected Discharge Plan: [place] Barriers to Discharge: Continued Medical Work up  Expected Discharge Plan and Services Expected Discharge Plan: [place] In-house Referral: Hospice / Palliative Care Discharge Planning Services: CM Consult Post Acute Care Choice: [place] Living arrangements for the past 2 months: Single Family Home                 DME Arranged: N/A DME Agency: NA       HH Arranged: NA           Social Determinants of Health (SDOH) Interventions    Readmission Risk Interventions Readmission Risk Prevention Plan 11/01/2020 10/19/2020  Transportation Screening Complete Complete  Medication Review Press photographer) Complete Complete  PCP or Specialist appointment within 3-5 days of discharge Complete Complete  HRI or Home Care Consult Complete Complete  SW Recovery Care/Counseling Consult Complete Complete  Palliative Care Screening Complete -  [place] Complete Not Applicable  Some recent data might be hidden

## 2020-11-12 MED ORDER — DRONABINOL 5 MG PO CAPS: 5.0000 mg | ORAL_CAPSULE | Freq: Two times a day (BID) | ORAL | 1 refills | Status: AC

## 2020-11-12 MED ORDER — ASCORBIC ACID 500 MG PO TABS: 500.0000 mg | ORAL_TABLET | Freq: Every day | ORAL | 0 refills | Status: AC

## 2020-11-12 MED ORDER — CALCIUM CARBONATE 1250 (500 CA) MG PO TABS: 1.0000 | ORAL_TABLET | Freq: Every day | ORAL | 0 refills | Status: AC

## 2020-11-12 MED ORDER — HEPARIN SOD (PORK) LOCK FLUSH 100 UNIT/ML IV SOLN: 500.0000 [IU] | INTRAVENOUS | Status: DC | PRN

## 2020-11-12 MED ORDER — PREDNISONE 5 MG PO TABS: 5.0000 mg | ORAL_TABLET | Freq: Every day | ORAL | 0 refills | Status: AC

## 2020-11-12 MED ORDER — OXYCODONE-ACETAMINOPHEN 7.5-325 MG PO TABS: 1.0000 | ORAL_TABLET | Freq: Three times a day (TID) | ORAL | 0 refills | Status: AC | PRN

## 2020-11-12 MED ORDER — BIOTENE DRY MOUTH MT LIQD: 15.0000 mL | OROMUCOSAL | 0 refills | Status: AC | PRN

## 2020-11-12 MED ORDER — HEPARIN SOD (PORK) LOCK FLUSH 100 UNIT/ML IV SOLN
INTRAVENOUS | Status: AC
  Administered 2020-11-12: 500 [IU]
  Filled 2020-11-12: qty 5

## 2020-11-12 MED ORDER — SODIUM CHLORIDE 0.9% FLUSH
10.0000 mL | Freq: Two times a day (BID) | INTRAVENOUS | 0 refills | Status: DC
Start: 2020-11-12 — End: 2020-11-12

## 2020-11-12 MED ORDER — APIXABAN 5 MG PO TABS: 5.0000 mg | ORAL_TABLET | Freq: Two times a day (BID) | ORAL | 0 refills | Status: AC

## 2020-11-12 NOTE — Discharge Instructions (Signed)
Breast Cancer, Female  Breast cancer is a malignant growth of tissue (tumor) in the breast. Unlike noncancerous (benign) tumors, malignant tumors are cancerous and can spread to other parts of the body. The two most common types of breast cancer start in the milk ducts (ductal carcinoma) or in the lobules where milk is made in the breast (lobular carcinoma). Breast cancer is one of the most common types of cancer in women. What are the causes? The exact cause of female breast cancer is unknown. What increases the risk? The following factors may make you more likely to develop this condition:  Being older than 56 years of age.  Race and ethnicity. Caucasian women generally have an increased risk, but African-American women are more likely to develop the disease before age 82.  Having a family history of breast cancer.  Having had breast cancer in the past.  Having certain noncancerous conditions of the breast, such as dense breast tissue.  Having the BRCA1 and BRCA2 genes.  Having a history of radiation exposure.  Obesity.  Starting menopause after age 62.  Starting your menstrual periods before age 69.  Having never been pregnant or having your first child after age 11.  Having never breastfed.  Using hormone therapy after menopause.  Using birth control pills.  Drinking more than one alcoholic drink a day.  Exposure to the drug DES, which was given to pregnant women from the 1940s to the 1970s. What are the signs or symptoms? Symptoms of this condition include:  A painless lump or thickening in your breast.  Changes in the size or shape of your breast.  Breast skin changes, such as puckering or dimpling.  Nipple abnormalities, such as scaling, crustiness, redness, or pulling in (retraction).  Nipple discharge that is bloody or clear.   How is this diagnosed? This condition may be diagnosed by:  Taking your medical history and doing a physical exam. During the  exam, your health care provider will feel the tissue around your breast and under your arms.  Taking a sample of nipple discharge. The sample will be examined under a microscope.  Performing imaging tests, such as breast X-rays (mammogram), breast ultrasound exams, or an MRI.  Taking a tissue sample (biopsy) from the breast. The sample will be examined under a microscope to look for cancer cells.  Taking a sample from the lymph nodes near the affected breast (sentinel node biopsy). Your cancer will be staged to determine its severity and extent. Staging is a careful attempt to find out the size of the tumor, whether the cancer has spread, and if so, to what parts of the body. Staging also includes testing your tumor for certain receptors, such as estrogen, progesterone, and human epidermal growth factor receptor 2 (HER2). This will help your cancer care team decide on a treatment that will work best for you. You may need to have more tests to determine the stage of your cancer. Stages include the following:  Stage 0--The tumor has not spread to other breast tissue.  Stage I--The cancer is only found in the breast or may be in the lymph nodes. The tumor may be up to  in (2 cm) wide.  Stage II--The cancer has spread to nearby lymph nodes. The tumor may be up to 2 in (5 cm) wide.  Stage III--The cancer has spread to more distant lymph nodes. The tumor may be larger than 2 in (5 cm) wide.  Stage IV--The cancer has spread to other  parts of the body, such as the bones, brain, liver, or lungs.   How is this treated? Treatment for this condition depends on the type and stage of the breast cancer. It may be treated with:  Surgery. This may involve breast-conserving surgery (lumpectomy or partial mastectomy) in which only the part of the breast containing the cancer is removed. Some normal tissue surrounding this area may also be removed. In some cases, surgery may be done to remove the entire breast  (mastectomy) and nipple. Lymph nodes may also be removed.  Radiation therapy, which uses high-energy rays to kill cancer cells.  Chemotherapy, which is the use of drugs to kill cancer cells.  Hormone therapy, which involves taking medicine to adjust the hormone levels in your body. You may take medicine to decrease your estrogen levels. This can help stop cancer cells from growing.  Targeted therapy, in which drugs are used to block the growth and spread of cancer cells. These drugs target a specific part of the cancer cell and usually cause fewer side effects than chemotherapy. Targeted therapy may be used alone or in combination with chemotherapy.  A combination of surgery, radiation, chemotherapy, or hormone therapy may be needed to treat breast cancer. Follow these instructions at home:  Take over-the-counter and prescription medicines only as told by your health care provider.  Eat a healthy diet. A healthy diet includes lots of fruits and vegetables, low-fat dairy products, lean meats, and fiber. ? Make sure half your plate is filled with fruits or vegetables. ? Choose high-fiber foods such as whole-grain breads and cereals.  Consider joining a support group. This may help you learn to cope with the stress of having breast cancer.  Talk to your health care team about exercise and physical activity. The right exercise program can: ? Help prevent or reduce symptoms such as fatigue or depression. ? Improve overall health and survival rates.  Keep all follow-up visits as told by your health care provider. This is important. Where to find more information  American Cancer Society: www.cancer.[place]: www.cancer.gov Contact a health care provider if:  You have a sudden increase in pain.  You have any symptoms or changes that concern you.  You lose weight without trying.  You notice a new lump in either breast or under your arm.  You develop swelling in  either arm or hand.  You have a fever.  You notice new fatigue or weakness. Get help right away if:  You have chest pain or trouble breathing.  You faint. Summary  Breast cancer is a malignant growth of tissue (tumor) in the breast.  Your cancer will be staged to determine its severity and extent.  Treatment for this condition depends on the type and stage of the breast cancer. This information is not intended to replace advice given to you by your health care provider. Make sure you discuss any questions you have with your health care provider. Document Revised: 07/12/2017 Document Reviewed: 03/25/2017 Elsevier Patient Education  2021 [employer].

## 2020-11-12 NOTE — Progress Notes (Signed)
EMS present for pt discharge; discharge packet given to EMS personnel to take to [facility] in [place], [place]; pt discharged via stretcher on 2L O2 [place] by EMS personnel; telephone call to [facility] [phone] left voicemail message advising that EMS is in route to their facility

## 2020-11-12 NOTE — TOC Transition Note (Signed)
Transition of Care [facility]) - CM/SW Discharge Note   Patient Details  Name: Jessica Willis MRN: [id number] Date of Birth: 04/08/1966  Transition of Care [facility]) CM/SW Contact:  Harriet Masson, RN Phone [phone] 11/12/2020, 10:15 AM   Clinical Narrative:    Confirmed with Wandra Feinstein [place]) [phone] that pt may be transported today to the facility. Requested FL2, D/c summary, H&P, Diagnosis and medication list. All information faxed accordingly and [facility] called for transport. Pt aware and RN provided information to call report for pt to arrive in room 117 today.   TOC available for any additional information or issues related.  Final next level of care: Skilled Nursing Facility Barriers to Discharge: No Barriers Identified   Patient Goals and CMS Choice Patient states their goals for this hospitalization and ongoing recovery are:: Agrees to SNF and wants to continue with cancer treatment CMS Medicare.gov Compare Post Acute Care list provided to:: Patient Choice offered to / list presented to : Patient  Discharge Placement                    Patient and family notified of of transfer: 11/12/20 (Pt refuse pt to be contracted per Dr. Leslye Peer)  Discharge Plan and Services In-house Referral: Hospice / Palliative Care Discharge Planning Services: CM Consult Post Acute Care Choice: [place]          DME Arranged: N/A DME Agency: NA       HH Arranged: NA          Social Determinants of Health (SDOH) Interventions     Readmission Risk Interventions Readmission Risk Prevention Plan 11/01/2020 10/19/2020  Transportation Screening Complete Complete  Medication Review Press photographer) Complete Complete  PCP or Specialist appointment within 3-5 days of discharge Complete Complete  HRI or Home Care Consult Complete Complete  SW Recovery Care/Counseling Consult Complete Complete  Palliative Care Screening Complete -  [place] Complete Not Applicable  Some recent data might be hidden

## 2020-11-12 NOTE — Progress Notes (Addendum)
Telephone call from [place] at [facility] [phone]; verbalized that her facility can accept this pt; gave pt report; pt's discharge pending arrival of EMS for nonemergency transport, pt will be on 2L O2 [place]; made pt aware of discharge status; the pt verbalized that she did not want the hospital advising her family or friends of her discharge status, "they have my cell phone number"; advised her that we would not share this information with anyone

## 2020-11-12 NOTE — Discharge Summary (Signed)
[place] at [place] NAME: Zali Kamaka    MR#:  [id number]  DATE OF BIRTH:  Mar 05, 1966  DATE OF ADMISSION:  10/25/2020 ADMITTING PHYSICIAN: Jennye Boroughs, MD  DATE OF DISCHARGE: 11/12/2020  PRIMARY CARE PHYSICIAN: [place]    ADMISSION DIAGNOSIS:  Dehydration [E86.0] Hypokalemia [E87.6] Paresthesia [R20.2] Hypernatremia [E87.0] Weakness [R53.1] Pain [R52] Fall [W19.XXXA] Right leg pain [M79.604]  DISCHARGE DIAGNOSIS:  Principal Problem:   Failure to thrive in adult Active Problems:   Breast mass, right   Metastatic breast cancer (HCC)   Anemia of chronic disease   Iron deficiency anemia   Bone metastasis (HCC)   Port-A-Cath in place   Chemotherapy-induced nausea   Poor appetite   Weakness   Dehydration   COVID-19 virus infection   Protein-calorie malnutrition, severe (HCC)   Hypernatremia   Acute deep vein thrombosis (DVT) of femoral vein of right lower extremity (HCC)   Pressure injury of skin   Chronic respiratory failure with hypoxia ([place])   SECONDARY DIAGNOSIS:   Past Medical History:  Diagnosis Date  . Anemia   . Family history of breast cancer   . Patient denies medical problems     HOSPITAL COURSE:   1.  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  Patient also has a lesion on the pancreatic tail and metastases seen in the orbits.  Patient is not a candidate for further treatments as per Dr. Tasia Catchings.  Patient agreeable to rehab.  Recommend palliative care at facility and conversion to hospice if patient declines. 2.  Failure to thrive and severe protein calorie malnutrition.  Continue Ensure and supplementation and encourage eating.  Patient on Marinol. 3.  Right femoral DVT.  Patient is on Eliquis for anticoagulation. 4.  COVID-19 infection.  Patient completed 3 days of remdesivir.  The facility that  she is going to will take the patient even though she is in isolation through 11/15/2020. 5.  Anemia of chronic disease.  Patient was given a unit of packed red blood cells during the hospital course.  Last hemoglobin 8.4. 6.  Leukocytosis likely steroid-induced.  Down to 5 mg of prednisone daily. 7.  Chronic hypoxic respiratory failure.  Continue 3 L of oxygen chronically 8.  Generalized weakness.  Patient agreeable to go to rehab. 9.  Stage II decubiti on right and left buttocks.  Present on admission.  Partial thickness loss of dermis presenting was a shallow open injury with red-pink wound bed without slough.  Local wound care with foam pads 10.  Patient is a DO NOT RESUSCITATE.  Recommend palliative care following at facility.  If patient declines conversion over to hospice. 11.  Right chest wall cancer cover with nonstick dressing daily.   DISCHARGE CONDITIONS:   Guarded  CONSULTS OBTAINED:  Treatment Team:  Earlie Server, MD  DRUG ALLERGIES:  No Known Allergies  DISCHARGE MEDICATIONS:   Allergies as of 11/12/2020   No Known Allergies     Medication List    STOP taking these medications   FeroSul 325 (65 FE) MG tablet Generic drug: ferrous sulfate   fluconazole 100 MG tablet Commonly known as: DIFLUCAN   lidocaine-prilocaine cream Commonly known as: EMLA   magic mouthwash w/lidocaine Soln   multivitamin with minerals Tabs tablet   potassium chloride SA 20 MEQ tablet Commonly known as: KLOR-CON     TAKE these medications  albuterol 108 (90 Base) MCG/ACT inhaler Commonly known as: VENTOLIN HFA Inhale 2 puffs into the lungs every 4 (four) hours as needed for wheezing or shortness of breath. What changed: Another medication with the same name was removed. Continue taking this medication, and follow the directions you see here.   antiseptic oral rinse Liqd 15 mLs by Mouth Rinse route as needed for dry mouth.   apixaban 5 MG Tabs tablet Commonly known as:  ELIQUIS Take 1 tablet (5 mg total) by mouth 2 (two) times daily.   ascorbic acid 500 MG tablet Commonly known as: VITAMIN C Take 1 tablet (500 mg total) by mouth daily.   calcium carbonate 1250 (500 Ca) MG tablet Commonly known as: OS-CAL - dosed in mg of elemental calcium Take 1 tablet (500 mg of elemental calcium total) by mouth daily with breakfast. What changed:  medication strength how much to take   dronabinol 5 MG capsule Commonly known as: MARINOL Take 1 capsule (5 mg total) by mouth 2 (two) times daily before lunch and supper.   feeding supplement Liqd Take 237 mLs by mouth 4 (four) times daily.   magnesium chloride 64 MG Tbec SR tablet Commonly known as: SLOW-MAG Take 1 tablet (64 mg total) by mouth daily.   metoprolol tartrate 50 MG tablet Commonly known as: LOPRESSOR Take 1 tablet (50 mg total) by mouth 2 (two) times daily.   ondansetron 4 MG tablet Commonly known as: ZOFRAN TAKE TWO TABLETS BY MOUTH EVERY 8 HOURS AS NEEDED FOR UP TO 14 DAYS FOR NAUSEA/VOMITING   oxyCODONE-acetaminophen 7.5-325 MG tablet Commonly known as: PERCOCET Take 1 tablet by mouth every 8 (eight) hours as needed for moderate pain or severe pain.   polyethylene glycol 17 g packet Commonly known as: MIRALAX / GLYCOLAX Take 17 g by mouth 2 (two) times daily.   predniSONE 5 MG tablet Commonly known as: DELTASONE Take 1 tablet (5 mg total) by mouth daily with breakfast. What changed:  how much to take additional instructions   sodium chloride 0.65 % Soln nasal spray Commonly known as: OCEAN Place 1 spray into both nostrils as needed for congestion.        DISCHARGE INSTRUCTIONS:   Follow-up with Dr. At rehab 1 day Follow-up with Dr. Tasia Catchings oncology 3 weeks  If you experience worsening of your admission symptoms, develop shortness of breath, life threatening emergency, suicidal or homicidal thoughts you must seek medical attention immediately by calling 911 or calling your MD  immediately  if symptoms less severe.  You Must read complete instructions/literature along with all the possible adverse reactions/side effects for all the Medicines you take and that have been prescribed to you. Take any new Medicines after you have completely understood and accept all the possible adverse reactions/side effects.   Please note  You were cared for by a hospitalist during your hospital stay. If you have any questions about your discharge medications or the care you received while you were in the hospital after you are discharged, you can call the unit and asked to speak with the hospitalist on call if the hospitalist that took care of you is not available. Once you are discharged, your primary care physician will handle any further medical issues. Please note that NO REFILLS for any discharge medications will be authorized once you are discharged, as it is imperative that you return to your primary care physician (or establish a relationship with a primary care physician if you do not have one)  for your aftercare needs so that they can reassess your need for medications and monitor your lab values.    Today   CHIEF COMPLAINT:   Chief Complaint  Patient presents with  . Leg Pain    HISTORY OF PRESENT ILLNESS:  Moe Brier  is a 55 y.o. female came in after a fall and also had leg pain   VITAL SIGNS:  Blood pressure 104/62, pulse 100, temperature 97.6 F (36.4 C), temperature source Oral, resp. rate 16, height 5\' 10"  (1.778 m), weight 69.4 kg, last menstrual period 06/16/2015, SpO2 100 %.  I/O:  No intake or output data in the 24 hours ending 11/12/20 0806  PHYSICAL EXAMINATION:  GENERAL:  55 y.o.-year-old patient lying in the bed with no acute distress.  EYES: Pupils equal, round, reactive to light and accommodation. No scleral icterus. HEENT: Head atraumatic, normocephalic. Oropharynx and nasopharynx clear.  LUNGS: Normal breath sounds bilaterally, no  wheezing, rales,rhonchi or crepitation. No use of accessory muscles of respiration.  CARDIOVASCULAR: S1, S2 tachycardic. No murmurs, rubs, or gallops.  ABDOMEN: Soft, non-tender, non-distended. Bowel sounds present. No organomegaly or mass.  EXTREMITIES: 2+ edema NEUROLOGIC: Patient able to straight leg raise PSYCHIATRIC: The patient is alert and answers all questions appropriately.  SKIN: No obvious rash.   DATA REVIEW:   CBC Recent Labs  Lab 11/09/20 0500  WBC 25.1*  HGB 8.4*  HCT 28.5*  PLT 304    Chemistries  Recent Labs  Lab 11/07/20 0417 11/08/20 0428 11/09/20 0500  NA 141   < > 142  K 4.0   < > 4.0  CL 102   < > 104  CO2 30   < > 30  GLUCOSE 87   < > 95  BUN 19   < > 22*  CREATININE <0.30*   < > 0.30*  CALCIUM 8.0*   < > 8.1*  AST 70*  --   --   ALT 22  --   --   ALKPHOS 333*  --   --   BILITOT 0.7  --   --    < > = values in this interval not displayed.     Microbiology Results  Results for orders placed or performed during the hospital encounter of 10/25/20  Culture, blood (Routine x 2)     Status: None   Collection Time: 10/25/20  2:49 PM   Specimen: BLOOD  Result Value Ref Range Status   Specimen Description BLOOD BLOOD LEFT HAND  Final   Special Requests   Final    BOTTLES DRAWN AEROBIC AND ANAEROBIC Blood Culture results may not be optimal due to an inadequate volume of blood received in culture bottles   Culture   Final    NO GROWTH 5 DAYS Performed at [facility], [address], [place], [place] [address]    Report Status 10/30/2020 FINAL  Final  Culture, blood (Routine x 2)     Status: None   Collection Time: 10/25/20  3:29 PM   Specimen: BLOOD  Result Value Ref Range Status   Specimen Description BLOOD BLOOD LEFT HAND  Final   Special Requests   Final    BOTTLES DRAWN AEROBIC AND ANAEROBIC Blood Culture adequate volume   Culture   Final    NO GROWTH 5 DAYS Performed at [facility], [address],  [place], [place] [address]    Report Status 10/30/2020 FINAL  Final  Resp Panel by RT-PCR (Flu A&B, Covid) Nasopharyngeal  Swab     Status: Abnormal   Collection Time: 10/25/20  3:30 PM   Specimen: Nasopharyngeal Swab; Nasopharyngeal(NP) swabs in vial transport medium  Result Value Ref Range Status   SARS Coronavirus 2 by RT PCR POSITIVE (A) NEGATIVE Final    Comment: RESULT CALLED TO, READ BACK BY AND VERIFIED WITH: laura hernandez 10/25/20 at 1641 by acr (NOTE) SARS-CoV-2 target nucleic acids are DETECTED.  The SARS-CoV-2 RNA is generally detectable in upper respiratory specimens during the acute phase of infection. Positive results are indicative of the presence of the identified virus, but do not rule out bacterial infection or co-infection with other pathogens not detected by the test. Clinical correlation with patient history and other diagnostic information is necessary to determine patient infection status. The expected result is Negative.  Fact Sheet for Patients: EntrepreneurPulse.com.au  Fact Sheet for Healthcare Providers: IncredibleEmployment.be  This test is not yet approved or cleared by the [place] FDA and  has been authorized for detection and/or diagnosis of SARS-CoV-2 by FDA under an Emergency Use Authorization (EUA).  This EUA will remain in effect (meaning this test c an be used) for the duration of  the COVID-19 declaration under Section 564(b)(1) of the Act, 21 U.S.C. section 360bbb-3(b)(1), unless the authorization is terminated or revoked sooner.     Influenza A by PCR NEGATIVE NEGATIVE Final   Influenza B by PCR NEGATIVE NEGATIVE Final    Comment: (NOTE) The Xpert Xpress SARS-CoV-2/FLU/RSV plus assay is intended as an aid in the diagnosis of influenza from Nasopharyngeal swab specimens and should not be used as a sole basis for treatment. Nasal washings and aspirates are unacceptable for Xpert Xpress  SARS-CoV-2/FLU/RSV testing.  Fact Sheet for Patients: EntrepreneurPulse.com.au  Fact Sheet for Healthcare Providers: IncredibleEmployment.be  This test is not yet approved or cleared by the [place] FDA and has been authorized for detection and/or diagnosis of SARS-CoV-2 by FDA under an Emergency Use Authorization (EUA). This EUA will remain in effect (meaning this test can be used) for the duration of the COVID-19 declaration under Section 564(b)(1) of the Act, 21 U.S.C. section 360bbb-3(b)(1), unless the authorization is terminated or revoked.  Performed at [facility], [address], [place], [place] [address]      Management plans discussed with the patient, and she is agreement.  Patient has refused me calling family during the time that I been seeing her.  CODE STATUS:     Code Status Orders  (From admission, onward)         Start     Ordered   11/09/20 1323  Do not attempt resuscitation (DNR)  Continuous       Question Answer Comment  In the event of cardiac or respiratory ARREST Do not call a "code blue"   In the event of cardiac or respiratory ARREST Do not perform Intubation, CPR, defibrillation or ACLS   In the event of cardiac or respiratory ARREST Use medication by any route, position, wound care, and other measures to relive pain and suffering. May use oxygen, suction and manual treatment of airway obstruction as needed for comfort.      11/09/20 1322        Code Status History    Date Active Date Inactive Code Status Order ID Comments User Context   10/25/2020 1530 11/09/2020 1322 Full Code [id number]  Criss Alvine, DO ED   10/13/2020 1828 10/22/2020 0035 DNR [id number]  Earlie Server, MD Inpatient   10/07/2020 [id number]  10/13/2020 1828 Full Code [id number]  Ivor Costa, MD Inpatient   06/16/2020 0010 07/01/2020 1818 Full Code [id number]  Elwyn Reach, MD ED   06/23/2015 2341 06/30/2015 1944 Full Code [id number]  Lance Coon, MD Inpatient   Advance Care Planning Activity    Advance Directive Documentation   Flowsheet Row Most Recent Value  Type of Advance Directive Healthcare Power of Attorney, Living will  Pre-existing out of facility DNR order (yellow form or pink MOST form) --  "MOST" Form in Place? --      TOTAL TIME TAKING CARE OF THIS PATIENT: 32 minutes.    Loletha Grayer M.D on 11/12/2020 at 8:06 AM  Between 7am to 6pm - Pager - [phone]  After 6pm go to [email or web address] - password [email or web address]  [facility]  CC: Primary care physician; [place]

## 2020-11-12 NOTE — Progress Notes (Signed)
MD order received in [facility] to discharge pt to a SNF today; TOC previously established discharge with [facility] in [place], [place] on 11/11/20.  TOC Lisa RN faxed discharge summary and called 911 for nonemergency transport from Room 104 to the [facility] in [place], [place].  Report called to [place] [phone] spoke with Ptikia RN, she needs to clarify with her DON that they can accept a COVID positive pt (10/25/20) to their facility and will call me back at the 1C nurse's station to advise

## 2020-11-12 NOTE — Plan of Care (Signed)

## 2020-11-22 ENCOUNTER — Inpatient Hospital Stay: Payer: Medicaid Other | Attending: Nurse Practitioner | Admitting: Hospice and Palliative Medicine

## 2020-12-11 DEATH — deceased
